# Patient Record
Sex: Male | Born: 1945 | Race: White | Hispanic: No | Marital: Married | Smoking: Former smoker
Health system: Southern US, Community
[De-identification: ages and names within clinical notes are randomized; demographics above are authoritative.]

## PROBLEM LIST (undated history)

## (undated) DIAGNOSIS — R519 Headache, unspecified: Secondary | ICD-10-CM

## (undated) DIAGNOSIS — R51 Headache: Secondary | ICD-10-CM

## (undated) DIAGNOSIS — I77810 Thoracic aortic ectasia: Secondary | ICD-10-CM

## (undated) DIAGNOSIS — F419 Anxiety disorder, unspecified: Secondary | ICD-10-CM

## (undated) DIAGNOSIS — J449 Chronic obstructive pulmonary disease, unspecified: Secondary | ICD-10-CM

## (undated) DIAGNOSIS — C44310 Basal cell carcinoma of skin of unspecified parts of face: Secondary | ICD-10-CM

## (undated) DIAGNOSIS — M5030 Other cervical disc degeneration, unspecified cervical region: Secondary | ICD-10-CM

## (undated) DIAGNOSIS — R600 Localized edema: Secondary | ICD-10-CM

## (undated) DIAGNOSIS — J189 Pneumonia, unspecified organism: Secondary | ICD-10-CM

## (undated) DIAGNOSIS — C61 Malignant neoplasm of prostate: Secondary | ICD-10-CM

## (undated) DIAGNOSIS — C4490 Unspecified malignant neoplasm of skin, unspecified: Secondary | ICD-10-CM

## (undated) DIAGNOSIS — I5032 Chronic diastolic (congestive) heart failure: Secondary | ICD-10-CM

## (undated) DIAGNOSIS — I272 Pulmonary hypertension, unspecified: Secondary | ICD-10-CM

## (undated) DIAGNOSIS — M1990 Unspecified osteoarthritis, unspecified site: Secondary | ICD-10-CM

## (undated) DIAGNOSIS — J42 Unspecified chronic bronchitis: Secondary | ICD-10-CM

## (undated) DIAGNOSIS — I4819 Other persistent atrial fibrillation: Secondary | ICD-10-CM

## (undated) DIAGNOSIS — K5900 Constipation, unspecified: Secondary | ICD-10-CM

## (undated) DIAGNOSIS — F329 Major depressive disorder, single episode, unspecified: Secondary | ICD-10-CM

## (undated) DIAGNOSIS — F32A Depression, unspecified: Secondary | ICD-10-CM

## (undated) DIAGNOSIS — Z872 Personal history of diseases of the skin and subcutaneous tissue: Secondary | ICD-10-CM

## (undated) HISTORY — DX: Chronic obstructive pulmonary disease, unspecified: J44.9

## (undated) HISTORY — DX: Chronic diastolic (congestive) heart failure: I50.32

## (undated) HISTORY — PX: COLONOSCOPY: SHX174

## (undated) HISTORY — PX: EXCISIONAL HEMORRHOIDECTOMY: SHX1541

## (undated) HISTORY — PX: PROSTATE BIOPSY: SHX241

## (undated) HISTORY — PX: TONSILLECTOMY: SUR1361

## (undated) HISTORY — DX: Thoracic aortic ectasia: I77.810

## (undated) HISTORY — DX: Other persistent atrial fibrillation: I48.19

## (undated) HISTORY — DX: Basal cell carcinoma of skin of unspecified parts of face: C44.310

## (undated) HISTORY — DX: Pulmonary hypertension, unspecified: I27.20

---

## 2000-05-13 HISTORY — PX: INGUINAL HERNIA REPAIR: SUR1180

## 2005-04-16 ENCOUNTER — Emergency Department (HOSPITAL_COMMUNITY): Admission: EM | Admit: 2005-04-16 | Discharge: 2005-04-16 | Payer: Self-pay | Admitting: Emergency Medicine

## 2009-07-01 ENCOUNTER — Encounter: Payer: Self-pay | Admitting: Internal Medicine

## 2009-08-02 ENCOUNTER — Ambulatory Visit: Payer: Self-pay | Admitting: Internal Medicine

## 2010-06-12 NOTE — Assessment & Plan Note (Signed)
Summary: Pulmonary/ new pt eval for hemoptysis   Visit Type:  Initial Consult Copy to:  Dr. Joseph Art Primary Provider/Referring Provider:  none  CC:  Hemoptysis.  History of Present Illness: 68 yowm active smoker no previous problems referred by Dr Clearence Cheek for hemoptyisis new onset  August 02, 2009 cc "colds all winter" dating back to October but gets better between 100% then abupt onset cough 3/20 x 2 tbsp after window cleaning with bleach productions with traces of blood initially then improved after abx assoc with bloody nasal drainage x 1 with abx started 3/21 Levaquin seems worse on prone position like something draining from nose.    Pt denies any significant sore throat, dysphagia, itching, sneezing,  nasal congestion or excess secretions,  fever, chills, sweats, unintended wt loss, pleuritic or exertional cp, change in activity tolerance  orthopnea pnd or leg swelling Pt also denies any obvious fluctuation in symptoms with weather or environmental change or other alleviating or aggravating factors.  .     Current Medications (verified): 1)  Multivitamins  Tabs (Multiple Vitamin) .[name].. 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  Smokes 1 ppd. Pianist  Review of Systems       The patient complains of coughing up blood.  The patient denies shortness of breath with activity, shortness of breath at rest, productive cough, non-productive cough, chest pain, irregular heartbeats, acid heartburn, indigestion, loss of appetite, weight change, abdominal pain, difficulty swallowing, sore throat, tooth/dental problems, headaches, nasal congestion/difficulty breathing through nose, sneezing, itching, ear ache, anxiety, depression, hand/feet  swelling, joint stiffness or pain, rash, change in color of mucus, and fever.    Vital Signs:  Patient profile:   65 year old male Height:      72 inches Weight:      188.13 pounds BMI:     25.61 O2 Sat:      96 % on Room air Temp:     97.5 degrees F oral Pulse rate:   100 / minute BP sitting:   122 / 78  (left arm)  Vitals Entered By: Tilden Dome (August 02, 2009 2:00 PM)  O2 Flow:  Room air  Physical Exam  Additional Exam:  wt 188 August 02, 2009 HEENT: nl dentition, turbinates, and orophanx. Nl external ear canals without cough reflex NECK :  without JVD/Nodes/TM/ nl carotid upstrokes bilaterally LUNGS: no acc muscle use, clear to A and P bilaterally without cough on insp or exp maneuvers CV:  RRR  no s3 or murmur or increase in P2, no edema  ABD:  soft and nontender with nl excursion in the supine position. 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  Pleural and parenchymal scarring at both apices.  No identifiably active process.  Impression & Recommendations:  Problem # 1:  HEMOPTYSIS (RFF-638.3) Likely from  bloody post nasal drainage post op suggested by worsening in supine position.  Agree with empriric levaquin and might consider fob if continues  but would prefer ENT take a look at their work first because this is the most likely source.  Problem # 2:  ABNORMAL LUNG XRAY (JJK-093.1)  Classic cxr for prior granomatous infection but no evidence of active dz or source for hemoptysis, no f/u needed unless symptoms recur.  Orders: New Patient Level V  (81829)  Medications Added to Medication List This Visit: 1)  Multivitamins Tabs (Multiple vitamin) .[name].. 1 once daily  Other Orders: T-2 View CXR ([id number])  Patient Instructions: 1)  No advil aleve or aspirin 2)  Finish Levaquin  3)  Return if you cough up blood without an obvious nasal source and have been cleared by your nose doctor at the [facility]

## 2011-04-29 ENCOUNTER — Ambulatory Visit (INDEPENDENT_AMBULATORY_CARE_PROVIDER_SITE_OTHER): Payer: Medicare Other

## 2011-04-29 DIAGNOSIS — R51 Headache: Secondary | ICD-10-CM

## 2011-04-29 DIAGNOSIS — H05239 Hemorrhage of unspecified orbit: Secondary | ICD-10-CM

## 2011-11-10 ENCOUNTER — Ambulatory Visit (INDEPENDENT_AMBULATORY_CARE_PROVIDER_SITE_OTHER): Payer: Medicare Other | Admitting: Emergency Medicine

## 2011-11-10 VITALS — BP 114/71 | HR 96 | Temp 98.2°F | Resp 18 | Ht 71.0 in | Wt 173.0 lb

## 2011-11-10 DIAGNOSIS — J019 Acute sinusitis, unspecified: Secondary | ICD-10-CM

## 2011-11-10 DIAGNOSIS — J309 Allergic rhinitis, unspecified: Secondary | ICD-10-CM

## 2011-11-10 MED ORDER — AMOXICILLIN 875 MG PO TABS: 875.0000 mg | ORAL_TABLET | Freq: Two times a day (BID) | ORAL | Status: AC

## 2011-11-10 MED ORDER — AZELASTINE HCL 0.1 % NA SOLN: 2.0000 | Freq: Two times a day (BID) | NASAL | Status: DC

## 2011-11-10 MED ORDER — FLUTICASONE PROPIONATE 50 MCG/ACT NA SUSP: 2.0000 | Freq: Every day | NASAL | Status: DC

## 2011-11-10 NOTE — Patient Instructions (Addendum)
Get plenty of rest and drink at least 64 ounces of water daily.

## 2011-11-10 NOTE — Progress Notes (Signed)
  Subjective:    Patient ID: [name], male    DOB: 11/29/1945, 66 y.o.   MRN: [id number]  HPI patient enters with a two-week history of headache congestion clear nasal drainage facial congestion and a postnasal drip with sore throat. He does have a cough which she seems is associated with a postnasal drip the    Review of Systems patient is in good health he sometimes takes some Claritin but overall does not take medication     Objective:   Physical Exam HEENT exam reveals the TMs to be clear to significantly congestion turbinates are red and edematous. Posterior pharynx is red. Neck is supple.       Assessment & Plan:  Patient with allergic rhinitis and secondary sinusitis. We'll treat with amoxicillin. Astelin. and Flonase.

## 2012-10-03 ENCOUNTER — Ambulatory Visit (INDEPENDENT_AMBULATORY_CARE_PROVIDER_SITE_OTHER): Payer: Medicare Other | Admitting: Family Medicine

## 2012-10-03 VITALS — BP 120/72 | HR 88 | Temp 98.9°F | Resp 18 | Ht 71.0 in | Wt 184.0 lb

## 2012-10-03 DIAGNOSIS — R059 Cough, unspecified: Secondary | ICD-10-CM

## 2012-10-03 DIAGNOSIS — R05 Cough: Secondary | ICD-10-CM

## 2012-10-03 DIAGNOSIS — J309 Allergic rhinitis, unspecified: Secondary | ICD-10-CM

## 2012-10-03 DIAGNOSIS — J209 Acute bronchitis, unspecified: Secondary | ICD-10-CM

## 2012-10-03 MED ORDER — FLUTICASONE PROPIONATE 50 MCG/ACT NA SUSP: 2.0000 | Freq: Every day | NASAL | Status: DC

## 2012-10-03 MED ORDER — AZELASTINE HCL 0.1 % NA SOLN: 2.0000 | Freq: Two times a day (BID) | NASAL | Status: DC

## 2012-10-03 MED ORDER — HYDROCODONE-HOMATROPINE 5-1.5 MG/5ML PO SYRP: 5.0000 mL | ORAL_SOLUTION | Freq: Every evening | ORAL | Status: DC | PRN

## 2012-10-03 MED ORDER — AMOXICILLIN-POT CLAVULANATE 875-125 MG PO TABS: 1.0000 | ORAL_TABLET | Freq: Two times a day (BID) | ORAL | Status: DC

## 2012-10-03 NOTE — Progress Notes (Signed)
 [facility]:  Office Visit  Chief Complaint:  Chief Complaint  Patient presents with  . chest congestion  . Allergies    HPI: [name] is a 67 y.o. male who complains of 4-5 days of  Chest congestion and lives in [place] area and thought it was allergies. However no improvement. He has chest congestion, runny nose, dripping, coughing up white sputum. He took tylenol. He had prostate bx last week so does not feel too good. He is still recovering from that. He did take some cough medicine. He smokes and has dx of COPD. Had this last year. Has been taking flonase. No SOB, CP, wheezing, facial pain or ear pain  Past Medical History  Diagnosis Date  . Cancer   . COPD (chronic obstructive pulmonary disease)   . Facial basal cell cancer    Past Surgical History  Procedure Laterality Date  . Hernia repair    . Prostate biopsy     History   Social History  . Marital Status: Single    Spouse Name: N/A    Number of Children: N/A  . Years of Education: N/A   Social History Main Topics  . Smoking status: Current Every Day Smoker  . Smokeless tobacco: None  . Alcohol Use: No  . Drug Use: No  . Sexually Active: None   Other Topics Concern  . None   Social History Narrative  . None   History reviewed. No pertinent family history. Allergies  Allergen Reactions  . Demerol (Meperidine) Other (See Comments)    Causes system to shutdown.    Prior to Admission medications   Medication Sig Start Date End Date Taking? Authorizing Provider  Ascorbic Acid (VITAMIN C) 1000 MG tablet Take 1,000 mg by mouth 2 (two) times daily.   Yes Historical Provider, MD  aspirin 81 MG tablet Take 81 mg by mouth daily.   Yes Historical Provider, MD  fluticasone (FLONASE) 50 MCG/ACT nasal spray Place 2 sprays into the nose daily. 11/10/11 11/09/12 Yes Darlyne Russian, MD  glucosamine-chondroitin 500-400 MG tablet Take 1 tablet by mouth 3 (three) times daily.   Yes Historical  Provider, MD  Iodine, Kelp, (KELP PO) Take by mouth.   Yes Historical Provider, MD  Probiotic Product (PROBIOTIC DAILY PO) Take by mouth.   Yes Historical Provider, MD  azelastine (ASTELIN) 137 MCG/SPRAY nasal spray Place 2 sprays into the nose 2 (two) times daily. Use in each nostril as directed 11/10/11 11/09/12  Darlyne Russian, MD     ROS: The patient denies fevers, chills, night sweats, unintentional weight loss, chest pain, palpitations, wheezing, dyspnea on exertion, nausea, vomiting, abdominal pain, dysuria, hematuria, melena, numbness, or tingling.   All other systems have been reviewed and were otherwise negative with the exception of those mentioned in the HPI and as above.    PHYSICAL EXAM: Filed Vitals:   10/03/12 1550  BP: 120/72  Pulse: 88  Temp: 98.9 F (37.2 C)  Resp: 18   Filed Vitals:   10/03/12 1550  Height: _0  (1.803 m)  Weight: 184 lb (83.462 kg)   Body mass index is 25.67 kg/(m^2).  General: Alert, no acute distress HEENT:  Normocephalic, atraumatic, oropharynx patent. + boggy nares, No exudates, TM nl. No sinus tendereness Cardiovascular:  Regular rate and rhythm, no rubs murmurs or gallops.  No Carotid bruits, radial pulse intact. No pedal edema.  Respiratory: Clear to auscultation bilaterally.  No wheezes, rales, or rhonchi.  No  cyanosis, no use of accessory musculature GI: No organomegaly, abdomen is soft and non-tender, positive bowel sounds.  No masses. Skin: No rashes. Neurologic: Facial musculature symmetric. Psychiatric: Patient is appropriate throughout our interaction. Lymphatic: No cervical lymphadenopathy Musculoskeletal: Gait intact.   LABS: No results found for this or any previous visit.   EKG/XRAY:   Primary read interpreted by Dr. Marin Comment at [facility].   ASSESSMENT/PLAN: Encounter Diagnoses  Name Primary?  . AR (allergic rhinitis) Yes  . Cough   . Acute bronchitis    Rx Augmentin Rx Hydromet Rx Flonase Take otc zyrtec and mucinex   F/u prn    , [place], DO 10/03/2012 5:00 PM

## 2012-11-17 ENCOUNTER — Encounter: Payer: Self-pay | Admitting: Family Medicine

## 2012-11-17 DIAGNOSIS — M6530 Trigger finger, unspecified finger: Secondary | ICD-10-CM

## 2012-12-04 ENCOUNTER — Ambulatory Visit (INDEPENDENT_AMBULATORY_CARE_PROVIDER_SITE_OTHER): Payer: Medicare Other | Admitting: Family Medicine

## 2012-12-04 VITALS — BP 108/68 | HR 79 | Temp 97.8°F | Resp 18 | Ht 71.0 in | Wt 184.0 lb

## 2012-12-04 DIAGNOSIS — R05 Cough: Secondary | ICD-10-CM

## 2012-12-04 DIAGNOSIS — J42 Unspecified chronic bronchitis: Secondary | ICD-10-CM

## 2012-12-04 DIAGNOSIS — K4020 Bilateral inguinal hernia, without obstruction or gangrene, not specified as recurrent: Secondary | ICD-10-CM

## 2012-12-04 DIAGNOSIS — K4090 Unilateral inguinal hernia, without obstruction or gangrene, not specified as recurrent: Secondary | ICD-10-CM

## 2012-12-04 DIAGNOSIS — J209 Acute bronchitis, unspecified: Secondary | ICD-10-CM

## 2012-12-04 MED ORDER — DOXYCYCLINE HYCLATE 100 MG PO TABS: 100.0000 mg | ORAL_TABLET | Freq: Two times a day (BID) | ORAL | Status: DC

## 2012-12-04 NOTE — Patient Instructions (Signed)
Start the antibiotic as discussed. Ok to take mucinex - unless this causes you to wheeze - then stop this medicine. Return to the clinic or go to the nearest emergency room if any of your symptoms worsen or new symptoms occur.  if you would like [place] to refer you to a surgeon for the hernia - let me know.   Cough, Adult  A cough is a reflex that helps clear your throat and airways. It can help heal the body or may be a reaction to an irritated airway. A cough may only last 2 or 3 weeks (acute) or may last more than 8 weeks (chronic).  CAUSES Acute cough:  Viral or bacterial infections. Chronic cough:  Infections.  Allergies.  Asthma.  Post-nasal drip.  Smoking.  Heartburn or acid reflux.  Some medicines.  Chronic lung problems (COPD).  Cancer. SYMPTOMS   Cough.  Fever.  Chest pain.  Increased breathing rate.  High-pitched whistling sound when breathing (wheezing).  Colored mucus that you cough up (sputum). TREATMENT   A bacterial cough may be treated with antibiotic medicine.  A viral cough must run its course and will not respond to antibiotics.  Your caregiver may recommend other treatments if you have a chronic cough. HOME CARE INSTRUCTIONS   Only take over-the-counter or prescription medicines for pain, discomfort, or fever as directed by your caregiver. Use cough suppressants only as directed by your caregiver.  Use a cold steam vaporizer or humidifier in your bedroom or home to help loosen secretions.  Sleep in a semi-upright position if your cough is worse at night.  Rest as needed.  Stop smoking if you smoke. SEEK IMMEDIATE MEDICAL CARE IF:   You have pus in your sputum.  Your cough starts to worsen.  You cannot control your cough with suppressants and are losing sleep.  You begin coughing up blood.  You have difficulty breathing.  You develop pain which is getting worse or is uncontrolled with medicine.  You have a fever. MAKE SURE YOU:    Understand these instructions.  Will watch your condition.  Will get help right away if you are not doing well or get worse. Document Released: 10/26/2010 Document Revised: 07/22/2011 Document Reviewed: 10/26/2010 [facility] Patient Information 2014 [place].  Hernia A hernia occurs when an internal organ pushes out through a weak spot in the abdominal wall. Hernias most commonly occur in the groin and around the navel. Hernias often can be pushed back into place (reduced). Most hernias tend to get worse over time. Some abdominal hernias can get stuck in the opening (irreducible or incarcerated hernia) and cannot be reduced. An irreducible abdominal hernia which is tightly squeezed into the opening is at risk for impaired blood supply (strangulated hernia). A strangulated hernia is a medical emergency. Because of the risk for an irreducible or strangulated hernia, surgery may be recommended to repair a hernia. CAUSES   Heavy lifting.  Prolonged coughing.  Straining to have a bowel movement.  A cut (incision) made during an abdominal surgery. HOME CARE INSTRUCTIONS   Bed rest is not required. You may continue your normal activities.  Avoid lifting more than 10 pounds (4.5 kg) or straining.  Cough gently. If you are a smoker it is best to stop. Even the best hernia repair can break down with the continual strain of coughing. Even if you do not have your hernia repaired, a cough will continue to aggravate the problem.  Do not wear anything tight over  your hernia. Do not try to keep it in with an outside bandage or truss. These can damage abdominal contents if they are trapped within the hernia sac.  Eat a normal diet.  Avoid constipation. Straining over long periods of time will increase hernia size and encourage breakdown of repairs. If you cannot do this with diet alone, stool softeners may be used. SEEK IMMEDIATE MEDICAL CARE IF:   You have a fever.  You develop  increasing abdominal pain.  You feel nauseous or vomit.  Your hernia is stuck outside the abdomen, looks discolored, feels hard, or is tender.  You have any changes in your bowel habits or in the hernia that are unusual for you.  You have increased pain or swelling around the hernia.  You cannot push the hernia back in place by applying gentle pressure while lying down. MAKE SURE YOU:   Understand these instructions.  Will watch your condition.  Will get help right away if you are not doing well or get worse. Document Released: 04/29/2005 Document Revised: 07/22/2011 Document Reviewed: 12/17/2007 [facility] Patient Information 2014 [place].
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
## 2012-12-04 NOTE — Progress Notes (Deleted)
  Subjective:    Patient ID: [name], male    DOB: [birth year], 67 y.o.   MRN: [id number]  HPI    Review of Systems     Objective:   Physical Exam        Assessment & Plan:

## 2013-02-09 ENCOUNTER — Ambulatory Visit (INDEPENDENT_AMBULATORY_CARE_PROVIDER_SITE_OTHER): Payer: Medicare Other | Admitting: Radiology

## 2013-02-09 DIAGNOSIS — Z23 Encounter for immunization: Secondary | ICD-10-CM

## 2013-12-27 ENCOUNTER — Encounter: Payer: Self-pay | Admitting: *Deleted

## 2013-12-28 ENCOUNTER — Telehealth: Payer: Self-pay | Admitting: *Deleted

## 2013-12-28 NOTE — Telephone Encounter (Signed)
As per previous documentation, attempted to phone patient this morning at previously listed home number, phoned a 2nd time & male stated she just received that number & did not know patient.  Attempted to contact patient at 2nd listed number with no answer & unable to leave voicemail.    Letter will be sent out requesting [facility]CP/[facility] information for updating his EMR.

## 2014-01-06 ENCOUNTER — Other Ambulatory Visit: Payer: Self-pay | Admitting: Family Medicine

## 2014-01-06 ENCOUNTER — Ambulatory Visit (INDEPENDENT_AMBULATORY_CARE_PROVIDER_SITE_OTHER): Payer: Medicare Other | Admitting: Family Medicine

## 2014-01-06 VITALS — BP 110/70 | HR 78 | Temp 98.1°F | Resp 16 | Ht 70.5 in | Wt 179.4 lb

## 2014-01-06 DIAGNOSIS — R51 Headache: Secondary | ICD-10-CM

## 2014-01-06 DIAGNOSIS — J309 Allergic rhinitis, unspecified: Secondary | ICD-10-CM

## 2014-01-06 DIAGNOSIS — R21 Rash and other nonspecific skin eruption: Secondary | ICD-10-CM

## 2014-01-06 DIAGNOSIS — L821 Other seborrheic keratosis: Secondary | ICD-10-CM

## 2014-01-06 MED ORDER — FLUOCINONIDE-E 0.05 % EX CREA: 1.0000 "application " | TOPICAL_CREAM | Freq: Two times a day (BID) | CUTANEOUS | Status: DC

## 2014-01-06 MED ORDER — FLUTICASONE PROPIONATE 50 MCG/ACT NA SUSP: 2.0000 | Freq: Every day | NASAL | Status: DC

## 2014-01-06 NOTE — Progress Notes (Signed)
This chart was scribed for Christopher Haber, MD by Ladene Artist, ED Scribe. The patient was seen in room 11. Patient's care was started at 8:40 AM.  Patient ID: [name] MRN: [id number], DOB: 1945-10-23, 68 y.o. Date of Encounter: 01/06/2014, 8:40 AM  Primary Physician: No PCP Per Patient  Chief Complaint  Patient presents with   Rash    L Lower leg x 7 dys   Headache    Only when lying down on/off x yrs   Skin Problem    lesion? R groin x 1 mth   HPI: 68 y.o. year old male with history below presents with rash to L lower leg onset 1 week ago. He suspects allergic reaction to band aid. Pt has tried OTC antibiotic cream and Gold Bond with no relief. Pt also reports a skin spot to his R groin first noted 1 month ago. He denies discomfort at this time. Pt reports h/o skin sensitivity over the past few years in his shoulders and chest.   Pt also presents with an intermittent L-sided HA over the past few years. Pt states that pain is exacerbated with lying down. He also states that HA seems to be seasonal. 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 He has tried different pillows with no relief. Pt has used a nasal spray in the past with relief but states that prescription was not refilled.   Pt works as a Radiographer, therapeutic. He has played for [employer] for the past 9 years. Pt was stationed n the [place] while in the [employer].   Past Medical History  Diagnosis Date   Cancer    COPD (chronic obstructive pulmonary disease)    Facial basal cell cancer      Home Meds: Prior to Admission medications   Medication Sig Start Date End Date Taking? Authorizing Provider  aspirin 81 MG tablet Take 81 mg by mouth daily.   Yes Historical Provider, MD  azelastine (ASTELIN) 137 MCG/SPRAY nasal spray Place 2 sprays into the nose 2 (two) times daily. Use in each nostril as directed 10/03/12 10/03/13  Thao P Le, DO  fluticasone (FLONASE) 50 MCG/ACT nasal spray Place 2 sprays into the nose daily. 10/03/12  10/03/13  Thao P Le, DO    Allergies:  Allergies  Allergen Reactions   Demerol [Meperidine] Other (See Comments)    Causes system to shutdown.     History   Social History   Marital Status: Single    Spouse Name: N/A    Number of Children: N/A   Years of Education: N/A   Occupational History   Not on file.   Social History Main Topics   Smoking status: Current Every Day Smoker   Smokeless tobacco: Not on file   Alcohol Use: No   Drug Use: No   Sexual Activity: Not on file   Other Topics Concern   Not on file   Social History Narrative   No narrative on file     Review of Systems: Constitutional: negative for chills, fever, night sweats, weight changes, or fatigue  HEENT: negative for vision changes, hearing loss, congestion, rhinorrhea, ST, epistaxis, or sinus pressure Cardiovascular: negative for chest pain or palpitations Respiratory: negative for hemoptysis, wheezing, shortness of breath, or cough Abdominal: negative for abdominal pain, nausea, vomiting, diarrhea, or constipation Dermatological: +rash Neurologic: negative for dizziness or syncope, +headache All other systems reviewed and are otherwise negative with the exception to those above and in the HPI.   Physical Exam: Blood pressure  110/70, pulse 78, temperature 98.1 F (36.7 C), temperature source Oral, resp. rate 16, height 5' 10.5" (1.791 m), weight 179 lb 6.4 oz (81.375 kg), SpO2 99.00%., Body mass index is 25.37 kg/(m^2). General: Well developed, well nourished, in no acute distress. Head: Normocephalic, atraumatic, eyes without discharge, sclera non-icteric, nares are without discharge. Bilateral auditory canals clear, TM's are without perforation, pearly grey and translucent with reflective cone of light bilaterally. Oral cavity moist, posterior pharynx without exudate, erythema, peritonsillar abscess, or post nasal drip.  Neck: Supple. No thyromegaly. Full ROM. No  lymphadenopathy. Lungs: Clear bilaterally to auscultation without wheezes, rales, or rhonchi. Breathing is unlabored. Heart: RRR with S1 S2. No murmurs, rubs, or gallops appreciated. Abdomen: Soft, non-tender, non-distended with normoactive bowel sounds. No hepatomegaly. No rebound/guarding. No obvious abdominal masses. GU:  Seborrheic keratoses 5 mm x 7 mm in right groin Msk:  Strength and tone normal for age. Extremities/Skin: Warm and dry. No clubbing or cyanosis. No edema. Patient has erythema in the distribution of the Band-Aids covering a recurring 5 mm ulceration of the left tibia Neuro: Alert and oriented X 3. Moves all extremities spontaneously. Gait is normal. CNII-XII grossly in tact. Psych:  Responds to questions appropriately with a normal affect.    ASSESSMENT AND PLAN:  68 y.o. year old male with  Rash and nonspecific skin eruption - Plan: fluocinonide-emollient (LIDEX-E) 0.05 % cream, Ambulatory referral to Dermatology  Seborrheic keratosis  Allergic rhinitis, unspecified allergic rhinitis type - Plan: fluticasone (FLONASE) 50 MCG/ACT nasal spray  Headache(784.0) - Plan: fluticasone (FLONASE) 50 MCG/ACT nasal spray   Signed, Christopher Haber, MD 01/06/2014 8:40 AM

## 2014-01-06 NOTE — Patient Instructions (Addendum)
You have seborrheic keratosis in the groin.  The left leg shows an allergic reaction to bandaid (possibly latex) and the sore itself is suggestive of a skin cancer  The left temporal headache may be from sinus polyp, since it occurs when you are prone. If the headache persists, please come back

## 2014-01-18 ENCOUNTER — Encounter: Payer: Self-pay | Admitting: *Deleted

## 2014-02-15 ENCOUNTER — Encounter: Payer: Self-pay | Admitting: *Deleted

## 2014-02-24 ENCOUNTER — Telehealth: Payer: Self-pay | Admitting: *Deleted

## 2014-02-24 NOTE — Telephone Encounter (Addendum)
Returned patient's call after he responded to my last letter...  He sees Dr. Beckey Downing (part of the "Red Team") at the [facility] and just saw him last week.  Updated PCP in system.  Phoned [facility] [phone]) & [facility] for office ([address]) with my name, title, location, patient name & dob and my contact info.  Celestia Khat, RN at [facility] returned my call & confirmed patient's PCP, but further stated he only went annually. Upon review of the patient's chart, he did not see anything regarding colorectal ca screening.  Patient also sees Dr. Lacinda Axon at [facility] in [place] secondary to prostate ca.    Fax # [phone] [facility] if further info needed.

## 2014-03-17 ENCOUNTER — Ambulatory Visit (INDEPENDENT_AMBULATORY_CARE_PROVIDER_SITE_OTHER): Payer: Medicare Other | Admitting: Family Medicine

## 2014-03-17 VITALS — BP 110/62 | HR 82 | Temp 97.8°F | Resp 16 | Ht 71.0 in | Wt 181.0 lb

## 2014-03-17 DIAGNOSIS — H00023 Hordeolum internum right eye, unspecified eyelid: Secondary | ICD-10-CM

## 2014-03-17 MED ORDER — POLYMYXIN B-TRIMETHOPRIM 10000-0.1 UNIT/ML-% OP SOLN: 2.0000 [drp] | Freq: Four times a day (QID) | OPHTHALMIC | Status: DC

## 2014-03-17 NOTE — Progress Notes (Signed)
Subjective:    Patient ID: [name], male    DOB: [birth year], 68 y.o.   MRN: [id number] This chart was scribed for Delman Cheadle, MD by Marti Sleigh, Medical Scribe. This patient was seen in Room 2 and the patient's care was started a 9:09 AM.  Chief Complaint  Patient presents with  . Facial Swelling    right x 2 weeks     HPI Past Medical History  Diagnosis Date  . Cancer   . COPD (chronic obstructive pulmonary disease)   . Facial basal cell cancer    Allergies  Allergen Reactions  . Demerol [Meperidine] Other (See Comments)    Causes system to shutdown.    Current Outpatient Prescriptions on File Prior to Visit  Medication Sig Dispense Refill  . aspirin 81 MG tablet Take 81 mg by mouth daily.    [name] azelastine (ASTELIN) 0.1 % nasal spray PLACE 2 SPRAYS IN EACH NOSTRIL TWICE DAILY AS DIRECTED 30 mL 11  . fluocinonide-emollient (LIDEX-E) 0.05 % cream Apply 1 application topically 2 (two) times daily. 30 g 0  . fluticasone (FLONASE) 50 MCG/ACT nasal spray Place 2 sprays into both nostrils daily. 16 g 2   No current facility-administered medications on file prior to visit.   HPI Comments: [name] is a 68 y.o. male with a past hx of hemoptysis, squamous cell carcinoma, and abnormal lung x-ray who presents to [facility] complaining of gradually improving swelling in his right eye with associated pain that started one week ago. Pt states that he thought it was going to develop into a stye, but it has not done that. Pt is concerned with blepharitis. He states his wife had blepharitis recently and they sleep in the same bed. Pt endorses vision disturbance, eye discharge, right upper lid is sensitivity, and grainy feeling in eyes bilaterally. Pt denies pain with eye movement or holes in his vision. Pt wears glasses. Pt states we visited his eye doctor last year and his doctor did not find glaucoma or cataracts. Pt is unaware of MOI. Pt has used allergy visine OTC.    Review of Systems   Constitutional: Negative for fever and chills.  HENT: Positive for facial swelling.        No nasal lacrimation.  Eyes: Positive for photophobia, pain, discharge and visual disturbance.       No tearing from eye.  Skin: Negative for rash.  Neurological: Positive for headaches.       Objective:  BP 110/62 mmHg  Pulse 82  Temp(Src) 97.8 F (36.6 C) (Oral)  Resp 16  Ht _0  (1.803 m)  Wt 181 lb (82.101 kg)  BMI 25.26 kg/m2  SpO2 99%  Physical Exam  Constitutional: He is oriented to person, place, and time. He appears well-developed and well-nourished.  HENT:  Head: Normocephalic and atraumatic.  Eyes: Pupils are equal, round, and reactive to light.  Lower lid normal bilaterally. Left lower lid normal. Right Upper lid internal injection over lateral aspect consistent with hordeum. Normal fundoscopic exam bilaterally.  Neck: No JVD present.  Cardiovascular: Normal rate and regular rhythm.   Pulmonary/Chest: Effort normal and breath sounds normal. No respiratory distress.  Neurological: He is alert and oriented to person, place, and time.  Skin: Skin is warm and dry.  Psychiatric: He has a normal mood and affect. His behavior is normal.  Nursing note and vitals reviewed.      Assessment & Plan:   Hordeolum internum, right  Meds ordered this  encounter  Medications  . trimethoprim-polymyxin b (POLYTRIM) ophthalmic solution    Sig: Place 2 drops into the right eye every 6 (six) hours.    Dispense:  10 mL    Refill:  0    I personally performed the services described in this documentation, which was scribed in my presence. The recorded information has been reviewed and considered, and addended by me as needed.  Delman Cheadle, MD MPH

## 2014-03-17 NOTE — Patient Instructions (Signed)
Sty A sty (hordeolum) is an infection of a gland in the eyelid located at the base of the eyelash. A sty may develop a white or yellow head of pus. It can be puffy (swollen). Usually, the sty will burst and pus will come out on its own. They do not leave lumps in the eyelid once they drain. A sty is often confused with another form of cyst of the eyelid called a chalazion. Chalazions occur within the eyelid and not on the edge where the bases of the eyelashes are. They often are red, sore and then form firm lumps in the eyelid. CAUSES   Germs (bacteria).  Lasting (chronic) eyelid inflammation. SYMPTOMS   Tenderness, redness and swelling along the edge of the eyelid at the base of the eyelashes.  Sometimes, there is a white or yellow head of pus. It may or may not drain. DIAGNOSIS  An ophthalmologist will be able to distinguish between a sty and a chalazion and treat the condition appropriately.  TREATMENT   Styes are typically treated with warm packs (compresses) until drainage occurs.  In rare cases, medicines that kill germs (antibiotics) may be prescribed. These antibiotics may be in the form of drops, cream or pills.  If a hard lump has formed, it is generally necessary to do a small incision and remove the hardened contents of the cyst in a minor surgical procedure done in the office.  In suspicious cases, your caregiver may send the contents of the cyst to the lab to be certain that it is not a rare, but dangerous form of cancer of the glands of the eyelid. HOME CARE INSTRUCTIONS   Wash your hands often and dry them with a clean towel. Avoid touching your eyelid. This may spread the infection to other parts of the eye.  Apply heat to your eyelid for 10 to 20 minutes, several times a day, to ease pain and help to heal it faster.  Do not squeeze the sty. Allow it to drain on its own. Wash your eyelid carefully 3 to 4 times per day to remove any pus. SEEK IMMEDIATE MEDICAL CARE IF:    Your eye becomes painful or puffy (swollen).  Your vision changes.  Your sty does not drain by itself within 3 days.  Your sty comes back within a short period of time, even with treatment.  You have redness (inflammation) around the eye.  You have a fever. Document Released: 02/06/2005 Document Revised: 07/22/2011 Document Reviewed: 08/13/2013 [facility] Patient Information 2015 [place], [place]. This information is not intended to replace advice given to you by your health care provider. Make sure you discuss any questions you have with your health care provider.

## 2014-06-05 ENCOUNTER — Ambulatory Visit (INDEPENDENT_AMBULATORY_CARE_PROVIDER_SITE_OTHER): Payer: Medicare Other | Admitting: Family Medicine

## 2014-06-05 ENCOUNTER — Ambulatory Visit (INDEPENDENT_AMBULATORY_CARE_PROVIDER_SITE_OTHER): Payer: Medicare Other

## 2014-06-05 VITALS — BP 110/65 | HR 84 | Temp 98.0°F | Resp 20 | Ht 70.25 in | Wt 182.1 lb

## 2014-06-05 DIAGNOSIS — R05 Cough: Secondary | ICD-10-CM | POA: Diagnosis not present

## 2014-06-05 DIAGNOSIS — J988 Other specified respiratory disorders: Secondary | ICD-10-CM | POA: Diagnosis not present

## 2014-06-05 DIAGNOSIS — J440 Chronic obstructive pulmonary disease with acute lower respiratory infection: Secondary | ICD-10-CM | POA: Diagnosis not present

## 2014-06-05 DIAGNOSIS — J22 Unspecified acute lower respiratory infection: Secondary | ICD-10-CM

## 2014-06-05 DIAGNOSIS — J189 Pneumonia, unspecified organism: Secondary | ICD-10-CM

## 2014-06-05 DIAGNOSIS — R059 Cough, unspecified: Secondary | ICD-10-CM

## 2014-06-05 MED ORDER — ALBUTEROL SULFATE HFA 108 (90 BASE) MCG/ACT IN AERS: 2.0000 | INHALATION_SPRAY | Freq: Four times a day (QID) | RESPIRATORY_TRACT | Status: DC | PRN

## 2014-06-05 MED ORDER — AZITHROMYCIN 250 MG PO TABS: ORAL_TABLET | ORAL | Status: DC

## 2014-06-05 MED ORDER — IPRATROPIUM BROMIDE 0.02 % IN SOLN
0.5000 mg | Freq: Once | RESPIRATORY_TRACT | Status: AC
  Administered 2014-06-05: 0.5 mg via RESPIRATORY_TRACT

## 2014-06-05 MED ORDER — ALBUTEROL SULFATE (2.5 MG/3ML) 0.083% IN NEBU
2.5000 mg | INHALATION_SOLUTION | Freq: Once | RESPIRATORY_TRACT | Status: AC
  Administered 2014-06-05: 2.5 mg via RESPIRATORY_TRACT

## 2014-06-05 NOTE — Progress Notes (Signed)
 Chief Complaint:  Chief Complaint  Patient presents with  . Cough    with green phelgm x 5 days    HPI: [name] is a 69 y.o. male who is here for cough x 5 days, GS is sick , 69 years old, productive greeen sputum.  He is 1 ppd smoker, has tried to quit but hard to do it He has had some rattling  But no SOB No fever , some chills No facial pain, no fevers or chill, diffuse chills a few day s ago Has tried water and nothing else.     Past Medical History  Diagnosis Date  . Cancer   . COPD (chronic obstructive pulmonary disease)   . Facial basal cell cancer    Past Surgical History  Procedure Laterality Date  . Hernia repair    . Prostate biopsy     History   Social History  . Marital Status: Single    Spouse Name: N/A    Number of Children: N/A  . Years of Education: N/A   Social History Main Topics  . Smoking status: Current Every Day Smoker  . Smokeless tobacco: Never Used  . Alcohol Use: No  . Drug Use: No  . Sexual Activity: None   Other Topics Concern  . None   Social History Narrative   History reviewed. No pertinent family history. Allergies  Allergen Reactions  . Demerol [Meperidine] Other (See Comments)    Causes system to shutdown.    Prior to Admission medications   Medication Sig Start Date End Date Taking? Authorizing Provider  aspirin 81 MG tablet Take 81 mg by mouth daily.   Yes Historical Provider, MD  fluticasone (FLONASE) 50 MCG/ACT nasal spray Place 2 sprays into both nostrils daily. 01/06/14 01/06/15 Yes [name], MD  azelastine (ASTELIN) 0.1 % nasal spray PLACE 2 SPRAYS IN EACH NOSTRIL TWICE DAILY AS DIRECTED Patient not taking: Reported on 06/05/2014 01/06/14   [name], MD     ROS: The patient denies night sweats, unintentional weight loss, chest pain, palpitations,dyspnea on exertion, nausea, vomiting, abdominal pain, dysuria, hematuria, melena, numbness, weakness, or tingling.  All other systems have  been reviewed and were otherwise negative with the exception of those mentioned in the HPI and as above.    PHYSICAL EXAM: Filed Vitals:   06/05/14 1214  BP: 96/60  Pulse: 84  Temp: 98 F (36.7 C)  Resp: 20  SPo2 95% Filed Vitals:   06/05/14 1214  Height: 5' 10.25" (1.784 m)  Weight: 182 lb 2 oz (82.611 kg)   Body mass index is 25.96 kg/(m^2).  General: Alert, no acute distress HEENT:  Normocephalic, atraumatic, oropharynx patent. EOMI, PERRLA Cardiovascular:  Regular rate and rhythm, no rubs murmurs or gallops.  No Carotid bruits, radial pulse intact. No pedal edema.  Respiratory: Clear to auscultation bilaterally.  No wheezes, rales, or rhonchi.  No cyanosis, no use of accessory musculature GI: No organomegaly, abdomen is soft and non-tender, positive bowel sounds.  No masses. Skin: No rashes. Neurologic: Facial musculature symmetric. Psychiatric: Patient is appropriate throughout our interaction. Lymphatic: No cervical lymphadenopathy Musculoskeletal: Gait intact.   SpO2 Readings from Last 3 Encounters:  06/05/14 95%  03/17/14 99%  01/06/14 99%     LABS: No results found for this or any previous visit.   EKG/XRAY:   Primary read interpreted by Dr. Marin Comment at [facility]. Left lobe PNA   ASSESSMENT/PLAN: Encounter Diagnoses  Name Primary?  . Cough   .  Lower respiratory infection (e.g., bronchitis, pneumonia, pneumonitis, pulmonitis) Yes  . COPD with acute lower respiratory infection    Repeat BP was 110/65, SPo2 96% after nebs Rx Azithormycin Rx albuterol inhaler prn , otc mucinex prn , tylenol/motrin prn  F/u in 3-4 weeks for PNA resolution    Gross sideeffects, risk and benefits, and alternatives of medications d/w patient. Patient is aware that all medications have potential sideeffects and we are unable to predict every sideeffect or drug-drug interaction that may occur.  , Call[place], DO 06/05/2014 2:10 PM

## 2014-06-05 NOTE — Patient Instructions (Signed)
Acute Bronchitis Bronchitis is inflammation of the airways that extend from the windpipe into the lungs (bronchi). The inflammation often causes mucus to develop. This leads to a cough, which is the most common symptom of bronchitis.  In acute bronchitis, the condition usually develops suddenly and goes away over time, usually in a couple weeks. Smoking, allergies, and asthma can make bronchitis worse. Repeated episodes of bronchitis may cause further lung problems.  CAUSES Acute bronchitis is most often caused by the same virus that causes a cold. The virus can spread from person to person (contagious) through coughing, sneezing, and touching contaminated objects. SIGNS AND SYMPTOMS   Cough.   Fever.   Coughing up mucus.   Body aches.   Chest congestion.   Chills.   Shortness of breath.   Sore throat.  DIAGNOSIS  Acute bronchitis is usually diagnosed through a physical exam. Your health care provider will also ask you questions about your medical history. Tests, such as chest X-rays, are sometimes done to rule out other conditions.  TREATMENT  Acute bronchitis usually goes away in a couple weeks. Oftentimes, no medical treatment is necessary. Medicines are sometimes given for relief of fever or cough. Antibiotic medicines are usually not needed but may be prescribed in certain situations. In some cases, an inhaler may be recommended to help reduce shortness of breath and control the cough. A cool mist vaporizer may also be used to help thin bronchial secretions and make it easier to clear the chest.  HOME CARE INSTRUCTIONS  Get plenty of rest.   Drink enough fluids to keep your urine clear or pale yellow (unless you have a medical condition that requires fluid restriction). Increasing fluids may help thin your respiratory secretions (sputum) and reduce chest congestion, and it will prevent dehydration.   Take medicines only as directed by your health care provider.  If  you were prescribed an antibiotic medicine, finish it all even if you start to feel better.  Avoid smoking and secondhand smoke. Exposure to cigarette smoke or irritating chemicals will make bronchitis worse. If you are a smoker, consider using nicotine gum or skin patches to help control withdrawal symptoms. Quitting smoking will help your lungs heal faster.   Reduce the chances of another bout of acute bronchitis by washing your hands frequently, avoiding people with cold symptoms, and trying not to touch your hands to your mouth, nose, or eyes.   Keep all follow-up visits as directed by your health care provider.  SEEK MEDICAL CARE IF: Your symptoms do not improve after 1 week of treatment.  SEEK IMMEDIATE MEDICAL CARE IF:  You develop an increased fever or chills.   You have chest pain.   You have severe shortness of breath.  You have bloody sputum.   You develop dehydration.  You faint or repeatedly feel like you are going to pass out.  You develop repeated vomiting.  You develop a severe headache. MAKE SURE YOU:   Understand these instructions.  Will watch your condition.  Will get help right away if you are not doing well or get worse. Document Released: 06/06/2004 Document Revised: 09/13/2013 Document Reviewed: 10/20/2012 [facility] Patient Information 2015 [place], [place]. This information is not intended to replace advice given to you by your health care provider. Make sure you discuss any questions you have with your health care provider. Pneumonia, Adult Pneumonia is an infection of the lungs. It may be caused by a germ (virus or bacteria). Some types of pneumonia can  spread easily from person to person. This can happen when you cough or sneeze. HOME CARE  Only take medicine as told by your doctor.  Take your medicine (antibiotics) as told. Finish it even if you start to feel better.  Do not smoke.  You may use a vaporizer or humidifier in your room. This  can help loosen thick spit (mucus).  Sleep so you are almost sitting up (semi-upright). This helps reduce coughing.  Rest. A shot (vaccine) can help prevent pneumonia. Shots are often advised for:  People over 70 years old.  Patients on chemotherapy.  People with long-term (chronic) lung problems.  People with immune system problems. GET HELP RIGHT AWAY IF:   You are getting worse.  You cannot control your cough, and you are losing sleep.  You cough up blood.  Your pain gets worse, even with medicine.  You have a fever.  Any of your problems are getting worse, not better.  You have shortness of breath or chest pain. MAKE SURE YOU:   Understand these instructions.  Will watch your condition.  Will get help right away if you are not doing well or get worse. Document Released: 10/16/2007 Document Revised: 07/22/2011 Document Reviewed: 07/20/2010 [facility] Patient Information 2015 [place], [place]. This information is not intended to replace advice given to you by your health care provider. Make sure you discuss any questions you have with your health care provider.

## 2014-06-06 ENCOUNTER — Telehealth: Payer: Self-pay | Admitting: Family Medicine

## 2014-06-06 ENCOUNTER — Encounter: Payer: Self-pay | Admitting: Family Medicine

## 2014-06-06 NOTE — Telephone Encounter (Signed)
Spoke with patient, feeling better already, advise to f.u prn if worsenign sxs, otherwise in 3 weeks after last dose of zpack for repeat chest xray.

## 2014-06-09 ENCOUNTER — Telehealth: Payer: Self-pay

## 2014-06-09 NOTE — Telephone Encounter (Signed)
Spoke with patient, he took last dose z pack today, denies fevers, CP , SOb, has some minimal ankle swelling bilaterally . He is feelignbetter but tired, he is no longer coughing up green sputum. Advise to monitor for worsening sxs and return to office in in next 24-48 hrs if worsenign sxs. Elevate feet, still try to walk but no exertion.

## 2014-06-09 NOTE — Telephone Encounter (Signed)
Patient is calling to speak with Dr. Marin Comment or anyone one from clinical. Patient states that he was diagnosed with pneumonia and he finished the medication that he was prescribed. Now patient has swelling in his ankles and would like to know if this is caused from the medication or if it's fluid retention from drinking lots of water. Please call patient [phone] Spoke to Dr. Marin Comment about patient. It was requested that this message be routed directly to her.

## 2014-06-12 ENCOUNTER — Ambulatory Visit (INDEPENDENT_AMBULATORY_CARE_PROVIDER_SITE_OTHER): Payer: Medicare Other | Admitting: Family Medicine

## 2014-06-12 ENCOUNTER — Ambulatory Visit (INDEPENDENT_AMBULATORY_CARE_PROVIDER_SITE_OTHER): Payer: Medicare Other

## 2014-06-12 VITALS — BP 110/68 | HR 80 | Temp 97.9°F | Resp 18 | Wt 191.0 lb

## 2014-06-12 DIAGNOSIS — I5030 Unspecified diastolic (congestive) heart failure: Secondary | ICD-10-CM

## 2014-06-12 DIAGNOSIS — J189 Pneumonia, unspecified organism: Secondary | ICD-10-CM

## 2014-06-12 DIAGNOSIS — R600 Localized edema: Secondary | ICD-10-CM | POA: Diagnosis not present

## 2014-06-12 LAB — POCT CBC
Granulocyte percent: 59.9 %G (ref 37–80)
HCT, POC: 38.3 % — AB (ref 43.5–53.7)
Hemoglobin: 12.2 g/dL — AB (ref 14.1–18.1)
Lymph, poc: 3.5 — AB (ref 0.6–3.4)
MCH, POC: 29.3 pg (ref 27–31.2)
MCHC: 31.8 g/dL (ref 31.8–35.4)
MCV: 92.2 fL (ref 80–97)
MID (cbc): 1.1 — AB (ref 0–0.9)
MPV: 6.8 fL (ref 0–99.8)
POC Granulocyte: 6.9 (ref 2–6.9)
POC LYMPH PERCENT: 30.7 %L (ref 10–50)
POC MID %: 9.4 %M (ref 0–12)
Platelet Count, POC: 509 10*3/uL — AB (ref 142–424)
RBC: 4.15 M/uL — AB (ref 4.69–6.13)
RDW, POC: 15.4 %
WBC: 11.5 10*3/uL — AB (ref 4.6–10.2)

## 2014-06-12 LAB — COMPREHENSIVE METABOLIC PANEL
ALT: 44 U/L (ref 0–53)
AST: 35 U/L (ref 0–37)
Albumin: 3.2 g/dL — ABNORMAL LOW (ref 3.5–5.2)
Alkaline Phosphatase: 125 U/L — ABNORMAL HIGH (ref 39–117)
BUN: 14 mg/dL (ref 6–23)
CO2: 29 mEq/L (ref 19–32)
Calcium: 9.2 mg/dL (ref 8.4–10.5)
Chloride: 104 mEq/L (ref 96–112)
Creat: 0.84 mg/dL (ref 0.50–1.35)
Glucose, Bld: 90 mg/dL (ref 70–99)
Potassium: 5.1 mEq/L (ref 3.5–5.3)
Sodium: 139 mEq/L (ref 135–145)
Total Bilirubin: 0.5 mg/dL (ref 0.2–1.2)
Total Protein: 6 g/dL (ref 6.0–8.3)

## 2014-06-12 MED ORDER — FUROSEMIDE 20 MG PO TABS: 20.0000 mg | ORAL_TABLET | Freq: Every day | ORAL | Status: DC

## 2014-06-12 MED ORDER — LEVOFLOXACIN 500 MG PO TABS
500.0000 mg | ORAL_TABLET | Freq: Every day | ORAL | Status: DC
Start: 2014-06-12 — End: 2014-08-06

## 2014-06-12 NOTE — Progress Notes (Signed)
° °  Subjective:    Patient ID: [name], male    DOB: Feb 04, 1946, 69 y.o.   MRN: [id number] This chart was scribed for [name], MD by Zola Button, Medical Scribe. This patient was seen in Room 10 and the patient's care was started at 9:05 AM.    HPI HPI Comments: [name] is a 69 y.o. male who presents to the [facility] for a follow-up for PNA in the left lung. He was recently seen by Dr. Marin Comment 1 week ago and was diagnosed with PNA then. Patient finished his course of Z-pack 2-3 days ago. Patient also reports having new leg swelling in both of his legs - he is unsure if it is a reaction to the medication. The coughing has improved (no longer productive), but the cough has not gone away. Patient denies fever. He still smokes, but states he has cut back since getting ill; smoking worsens his symptoms.  Note by Dr. Marin Comment on 06/05/14: [name] is a 69 y.o. male who is here for cough x 5 days, GS is sick , 69 years old, productive greeen sputum.  He is 1 ppd smoker, has tried to quit but hard to do it He has had some rattling But no SOB No fever , some chills No facial pain, no fevers or chill, diffuse chills a few day s ago Has tried water and nothing else.   Review of Systems  Constitutional: Negative for fever.  Respiratory: Positive for cough.   Cardiovascular: Positive for leg swelling.       Objective:   Physical Exam CONSTITUTIONAL: Well developed/well nourished HEAD: Normocephalic/atraumatic EYES: EOM/PERRL ENMT: Mucous membranes moist, oropharynx clear NECK: supple no meningeal signs. No JVD, no adenopathy SPINE: entire spine nontender CV: S1/S2 noted, no murmurs/rubs/gallops noted LUNGS: Lungs are bibasilar more so on the left with extruded wheezes bilaterally. ABDOMEN: soft, nontender, no rebound or guarding GU: no cva tenderness NEURO: Pt is awake/alert, moves all extremitiesx4 EXTREMITIES: 2+ edema in feet midway up to calves  bilaterally. SKIN: warm, color normal PSYCH: no abnormalities of mood noted  [facility] reading (PRIMARY) by  Dr. Joseph Art chest x-ray shows some resolution of the left lower lobe infiltrate but there is diffuse interstitial edema throughout both lung fields..        Assessment & Plan:   This chart was scribed in my presence and reviewed by me personally.    ICD-9-CM ICD-10-CM   1. CAP (community acquired pneumonia) 68 J18.9 DG Chest 2 View     DG Chest 2 View     Comprehensive metabolic panel     POCT CBC     levofloxacin (LEVAQUIN) 500 MG tablet  2. Pedal edema 782.3 R60.0 Comprehensive metabolic panel  3. Diastolic congestive heart failure, unspecified congestive heart failure chronicity 428.30 I50.30 furosemide (LASIX) 20 MG tablet   428.0       Signed, [name], MD

## 2014-06-12 NOTE — Patient Instructions (Signed)
Pneumonia Pneumonia is an infection of the lungs.  CAUSES Pneumonia may be caused by bacteria or a virus. Usually, these infections are caused by breathing infectious particles into the lungs (respiratory tract). SIGNS AND SYMPTOMS   Cough.  Fever.  Chest pain.  Increased rate of breathing.  Wheezing.  Mucus production. DIAGNOSIS  If you have the common symptoms of pneumonia, your health care provider will typically confirm the diagnosis with a chest X-ray. The X-ray will show an abnormality in the lung (pulmonary infiltrate) if you have pneumonia. Other tests of your blood, urine, or sputum may be done to find the specific cause of your pneumonia. Your health care provider may also do tests (blood gases or pulse oximetry) to see how well your lungs are working. TREATMENT  Some forms of pneumonia may be spread to other people when you cough or sneeze. You may be asked to wear a mask before and during your exam. Pneumonia that is caused by bacteria is treated with antibiotic medicine. Pneumonia that is caused by the influenza virus may be treated with an antiviral medicine. Most other viral infections must run their course. These infections will not respond to antibiotics.  HOME CARE INSTRUCTIONS   Cough suppressants may be used if you are losing too much rest. However, coughing protects you by clearing your lungs. You should avoid using cough suppressants if you can.  Your health care provider may have prescribed medicine if he or she thinks your pneumonia is caused by bacteria or influenza. Finish your medicine even if you start to feel better.  Your health care provider may also prescribe an expectorant. This loosens the mucus to be coughed up.  Take medicines only as directed by your health care provider.  Do not smoke. Smoking is a common cause of bronchitis and can contribute to pneumonia. If you are a smoker and continue to smoke, your cough may last several weeks after your  pneumonia has cleared.  A cold steam vaporizer or humidifier in your room or home may help loosen mucus.  Coughing is often worse at night. Sleeping in a semi-upright position in a recliner or using a couple pillows under your head will help with this.  Get rest as you feel it is needed. Your body will usually let you know when you need to rest. PREVENTION A pneumococcal shot (vaccine) is available to prevent a common bacterial cause of pneumonia. This is usually suggested for:  People over 65 years old.  Patients on chemotherapy.  People with chronic lung problems, such as bronchitis or emphysema.  People with immune system problems. If you are over 65 or have a high risk condition, you may receive the pneumococcal vaccine if you have not received it before. In some countries, a routine influenza vaccine is also recommended. This vaccine can help prevent some cases of pneumonia.You may be offered the influenza vaccine as part of your care. If you smoke, it is time to quit. You may receive instructions on how to stop smoking. Your health care provider can provide medicines and counseling to help you quit. SEEK MEDICAL CARE IF: You have a fever. SEEK IMMEDIATE MEDICAL CARE IF:   Your illness becomes worse. This is especially true if you are elderly or weakened from any other disease.  You cannot control your cough with suppressants and are losing sleep.  You begin coughing up blood.  You develop pain which is getting worse or is uncontrolled with medicines.  Any of the symptoms   which initially brought you in for treatment are getting worse rather than better.  You develop shortness of breath or chest pain. MAKE SURE YOU:   Understand these instructions.  Will watch your condition.  Will get help right away if you are not doing well or get worse. Document Released: 04/29/2005 Document Revised: 09/13/2013 Document Reviewed: 07/19/2010 [facility] Patient Information 2015  [place], [place]. This information is not intended to replace advice given to you by your health care provider. Make sure you discuss any questions you have with your health care provider. Heart Failure Heart failure is a condition in which the heart has trouble pumping blood. This means your heart does not pump blood efficiently for your body to work well. In some cases of heart failure, fluid may back up into your lungs or you may have swelling (edema) in your lower legs. Heart failure is usually a long-term (chronic) condition. It is important for you to take good care of yourself and follow your health care provider's treatment plan. CAUSES  Some health conditions can cause heart failure. Those health conditions include:  High blood pressure (hypertension). Hypertension causes the heart muscle to work harder than normal. When pressure in the blood vessels is high, the heart needs to pump (contract) with more force in order to circulate blood throughout the body. High blood pressure eventually causes the heart to become stiff and weak.  Coronary artery disease (CAD). CAD is the buildup of cholesterol and fat (plaque) in the arteries of the heart. The blockage in the arteries deprives the heart muscle of oxygen and blood. This can cause chest pain and may lead to a heart attack. High blood pressure can also contribute to CAD.  Heart attack (myocardial infarction). A heart attack occurs when one or more arteries in the heart become blocked. The loss of oxygen damages the muscle tissue of the heart. When this happens, part of the heart muscle dies. The injured tissue does not contract as well and weakens the heart's ability to pump blood.  Abnormal heart valves. When the heart valves do not open and close properly, it can cause heart failure. This makes the heart muscle pump harder to keep the blood flowing.  Heart muscle disease (cardiomyopathy or myocarditis). Heart muscle disease is damage to the heart  muscle from a variety of causes. These can include drug or alcohol abuse, infections, or unknown reasons. These can increase the risk of heart failure.  Lung disease. Lung disease makes the heart work harder because the lungs do not work properly. This can cause a strain on the heart, leading it to fail.  Diabetes. Diabetes increases the risk of heart failure. High blood sugar contributes to high fat (lipid) levels in the blood. Diabetes can also cause slow damage to tiny blood vessels that carry important nutrients to the heart muscle. When the heart does not get enough oxygen and food, it can cause the heart to become weak and stiff. This leads to a heart that does not contract efficiently.  Other conditions can contribute to heart failure. These include abnormal heart rhythms, thyroid problems, and low blood counts (anemia). Certain unhealthy behaviors can increase the risk of heart failure, including:  Being overweight.  Smoking or chewing tobacco.  Eating foods high in fat and cholesterol.  Abusing illicit drugs or alcohol.  Lacking physical activity. SYMPTOMS  Heart failure symptoms may vary and can be hard to detect. Symptoms may include:  Shortness of breath with activity, such as climbing  stairs.  Persistent cough.  Swelling of the feet, ankles, legs, or abdomen.  Unexplained weight gain.  Difficulty breathing when lying flat (orthopnea).  Waking from sleep because of the need to sit up and get more air.  Rapid heartbeat.  Fatigue and loss of energy.  Feeling light-headed, dizzy, or close to fainting.  Loss of appetite.  Nausea.  Increased urination during the night (nocturia). DIAGNOSIS  A diagnosis of heart failure is based on your history, symptoms, physical examination, and diagnostic tests. Diagnostic tests for heart failure may include:  Echocardiography.  Electrocardiography.  Chest X-ray.  Blood tests.  Exercise stress test.  Cardiac  angiography.  Radionuclide scans. TREATMENT  Treatment is aimed at managing the symptoms of heart failure. Medicines, behavioral changes, or surgical intervention may be necessary to treat heart failure.  Medicines to help treat heart failure may include:  Angiotensin-converting enzyme (ACE) inhibitors. This type of medicine blocks the effects of a blood protein called angiotensin-converting enzyme. ACE inhibitors relax (dilate) the blood vessels and help lower blood pressure.  Angiotensin receptor blockers (ARBs). This type of medicine blocks the actions of a blood protein called angiotensin. Angiotensin receptor blockers dilate the blood vessels and help lower blood pressure.  Water pills (diuretics). Diuretics cause the kidneys to remove salt and water from the blood. The extra fluid is removed through urination. This loss of extra fluid lowers the volume of blood the heart pumps.  Beta blockers. These prevent the heart from beating too fast and improve heart muscle strength.  Digitalis. This increases the force of the heartbeat.  Healthy behavior changes include:  Obtaining and maintaining a healthy weight.  Stopping smoking or chewing tobacco.  Eating heart-healthy foods.  Limiting or avoiding alcohol.  Stopping illicit drug use.  Physical activity as directed by your health care provider.  Surgical treatment for heart failure may include:  A procedure to open blocked arteries, repair damaged heart valves, or remove damaged heart muscle tissue.  A pacemaker to improve heart muscle function and control certain abnormal heart rhythms.  An internal cardioverter defibrillator to treat certain serious abnormal heart rhythms.  A left ventricular assist device (LVAD) to assist the pumping ability of the heart. HOME CARE INSTRUCTIONS   Take medicines only as directed by your health care provider. Medicines are important in reducing the workload of your heart, slowing the  progression of heart failure, and improving your symptoms.  Do not stop taking your medicine unless directed by your health care provider.  Do not skip any dose of medicine.  Refill your prescriptions before you run out of medicine. Your medicines are needed every day.  Engage in moderate physical activity if directed by your health care provider. Moderate physical activity can benefit some people. The elderly and people with severe heart failure should consult with a health care provider for physical activity recommendations.  Eat heart-healthy foods. Food choices should be free of trans fat and low in saturated fat, cholesterol, and salt (sodium). Healthy choices include fresh or frozen fruits and vegetables, fish, lean meats, legumes, fat-free or low-fat dairy products, and whole grain or high fiber foods. Talk to a dietitian to learn more about heart-healthy foods.  Limit sodium if directed by your health care provider. Sodium restriction may reduce symptoms of heart failure in some people. Talk to a dietitian to learn more about heart-healthy seasonings.  Use healthy cooking methods. Healthy cooking methods include roasting, grilling, broiling, baking, poaching, steaming, or stir-frying. Talk to a dietitian  to learn more about healthy cooking methods.  Limit fluids if directed by your health care provider. Fluid restriction may reduce symptoms of heart failure in some people.  Weigh yourself every day. Daily weights are important in the early recognition of excess fluid. You should weigh yourself every morning after you urinate and before you eat breakfast. Wear the same amount of clothing each time you weigh yourself. Record your daily weight. Provide your health care provider with your weight record.  Monitor and record your blood pressure if directed by your health care provider.  Check your pulse if directed by your health care provider.  Lose weight if directed by your health care  provider. Weight loss may reduce symptoms of heart failure in some people.  Stop smoking or chewing tobacco. Nicotine makes your heart work harder by causing your blood vessels to constrict. Do not use nicotine gum or patches before talking to your health care provider.  Keep all follow-up visits as directed by your health care provider. This is important.  Limit alcohol intake to no more than 1 drink per day for nonpregnant women and 2 drinks per day for men. One drink equals 12 ounces of beer, 5 ounces of wine, or 1 ounces of hard liquor. Drinking more than that is harmful to your heart. Tell your health care provider if you drink alcohol several times a week. Talk with your health care provider about whether alcohol is safe for you. If your heart has already been damaged by alcohol or you have severe heart failure, drinking alcohol should be stopped completely.  Stop illicit drug use.  Stay up-to-date with immunizations. It is especially important to prevent respiratory infections through current pneumococcal and influenza immunizations.  Manage other health conditions such as hypertension, diabetes, thyroid disease, or abnormal heart rhythms as directed by your health care provider.  Learn to manage stress.  Plan rest periods when fatigued.  Learn strategies to manage high temperatures. If the weather is extremely hot:  Avoid vigorous physical activity.  Use air conditioning or fans or seek a cooler location.  Avoid caffeine and alcohol.  Wear loose-fitting, lightweight, and light-colored clothing.  Learn strategies to manage cold temperatures. If the weather is extremely cold:  Avoid vigorous physical activity.  Layer clothes.  Wear mittens or gloves, a hat, and a scarf when going outside.  Avoid alcohol.  Obtain ongoing education and support as needed.  Participate in or seek rehabilitation as needed to maintain or improve independence and quality of life. SEEK MEDICAL  CARE IF:   Your weight increases by 03 lb/1.4 kg in 1 day or 05 lb/2.3 kg in a week.  You have increasing shortness of breath that is unusual for you.  You are unable to participate in your usual physical activities.  You tire easily.  You cough more than normal, especially with physical activity.  You have any or more swelling in areas such as your hands, feet, ankles, or abdomen.  You are unable to sleep because it is hard to breathe.  You feel like your heart is beating fast (palpitations).  You become dizzy or light-headed upon standing up. SEEK IMMEDIATE MEDICAL CARE IF:   You have difficulty breathing.  There is a change in mental status such as decreased alertness or difficulty with concentration.  You have a pain or discomfort in your chest.  You have an episode of fainting (syncope). MAKE SURE YOU:   Understand these instructions.  Will watch your condition.  Will get help right away if you are not doing well or get worse. Document Released: 04/29/2005 Document Revised: 09/13/2013 Document Reviewed: 05/29/2012 [facility] Patient Information 2015 [place], [place]. This information is not intended to replace advice given to you by your health care provider. Make sure you discuss any questions you have with your health care provider.

## 2014-06-17 ENCOUNTER — Ambulatory Visit (INDEPENDENT_AMBULATORY_CARE_PROVIDER_SITE_OTHER): Payer: Medicare Other | Admitting: Family Medicine

## 2014-06-17 VITALS — BP 110/60 | HR 82 | Temp 98.0°F | Resp 16 | Ht 71.0 in | Wt 183.0 lb

## 2014-06-17 DIAGNOSIS — R600 Localized edema: Secondary | ICD-10-CM | POA: Diagnosis not present

## 2014-06-17 DIAGNOSIS — J189 Pneumonia, unspecified organism: Secondary | ICD-10-CM

## 2014-06-17 NOTE — Progress Notes (Signed)
° °  Subjective:    Patient ID: Krist Rosenboom, male    DOB: 11/01/1945, 69 y.o.   MRN: [id number] This chart was scribed for [name], MD by Zola Button, Medical Scribe. This patient was seen in Room 5 and the patient's care was started at 9:09 AM.   HPI HPI Comments: Nelvin Tomb is a 69 y.o. male who presents to the [facility] for a follow-up. He has not been able to come here sooner because his wife recently got sick; he thinks he spread his illness to her. Patient still has leg swelling. The swelling is improved when he puts his shoes and socks on and walks around. He reports great improvement with symptoms related to his pneumonia. He had a pneumonia shot at the [place] 4 months ago. Patient denies calf soreness and urinary frequency from the medication.  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  Note from 06/12/14: Kailan Laws is a 69 y.o. male who presents to the [facility] for a follow-up for PNA in the left lung. He was recently seen by Dr. Marin Comment 1 week ago and was diagnosed with PNA then. Patient finished his course of Z-pack 2-3 days ago. Patient also reports having new leg swelling in both of his legs - he is unsure if it is a reaction to the medication. The coughing has improved (no longer productive), but the cough has not gone away. Patient denies fever. He still smokes, but states he has cut back since getting ill; smoking worsens his symptoms. Patient states he normally sleeps on his side.   Review of Systems  Cardiovascular: Positive for leg swelling.  Genitourinary: Negative for frequency.  Musculoskeletal: Negative for myalgias.       Objective:   Physical Exam CONSTITUTIONAL: Well developed/well nourished HEAD: Normocephalic/atraumatic EYES: EOM/PERRL ENMT: Mucous membranes moist NECK: supple no meningeal signs SPINE: entire spine nontender CV: S1/S2 noted, no murmurs/rubs/gallops noted LUNGS: Lungs  are clear to auscultation bilaterally, no apparent distress ABDOMEN: soft, nontender, no rebound or guarding GU: no cva tenderness NEURO: Pt is awake/alert, moves all extremitiesx4 EXTREMITIES: pulses normal, full ROM SKIN: 1.5 cm cystic mass anterior to the voice box. PSYCH: no abnormalities of mood noted      Assessment & Plan:   Patient to complete his antibiotic course and double up on the Lasix the next few days. We also talked about elevating the legs to help with the edema.  Recheck if edema does not go down in the next 2-3 days.   Signed, Carola Frost.D.

## 2014-08-06 ENCOUNTER — Ambulatory Visit (INDEPENDENT_AMBULATORY_CARE_PROVIDER_SITE_OTHER): Payer: Medicare Other | Admitting: Family Medicine

## 2014-08-06 ENCOUNTER — Ambulatory Visit (INDEPENDENT_AMBULATORY_CARE_PROVIDER_SITE_OTHER): Payer: Medicare Other

## 2014-08-06 VITALS — BP 108/70 | HR 83 | Temp 98.0°F | Resp 20 | Ht 70.0 in | Wt 181.1 lb

## 2014-08-06 DIAGNOSIS — K4021 Bilateral inguinal hernia, without obstruction or gangrene, recurrent: Secondary | ICD-10-CM

## 2014-08-06 DIAGNOSIS — R059 Cough, unspecified: Secondary | ICD-10-CM

## 2014-08-06 DIAGNOSIS — R05 Cough: Secondary | ICD-10-CM

## 2014-08-06 DIAGNOSIS — R9389 Abnormal findings on diagnostic imaging of other specified body structures: Secondary | ICD-10-CM

## 2014-08-06 DIAGNOSIS — R938 Abnormal findings on diagnostic imaging of other specified body structures: Secondary | ICD-10-CM

## 2014-08-06 DIAGNOSIS — R600 Localized edema: Secondary | ICD-10-CM | POA: Diagnosis not present

## 2014-08-06 MED ORDER — AMOXICILLIN 875 MG PO TABS: 875.0000 mg | ORAL_TABLET | Freq: Two times a day (BID) | ORAL | Status: DC

## 2014-08-06 NOTE — Progress Notes (Signed)
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  He's had a problem with a right inguinal hernia for a long time but has noticed increasing swelling and tenderness on the left side now.  Patient's had a stormy course of pneumonia two months ago and recently started coughing again. He's a smoker and has been exposed to RSV there is grandson.  Patient has also had problems in the past with swelling of his legs. This has been resolved with the use of support hose and although his feet are somewhat cool with times, these had no skin breaks or loss of sensation.  Objective: Patient's alert and in no acute distress HEENT: Poor dentition, otherwise negative Chest: Rales in the left base Heart: Regular, no murmur Abdomen: Patient has bilateral hernias that are reducible but extending from the internal inguinal ring all the way to the external inguinal canal. Extremities: Normal gait, good pedal pulses, distal toes are somewhat violaceous and cool but they do have good capillary refill   [facility] reading (PRIMARY) by  Dr. Joseph Art:  CXR.lingular infiltrate has resolved. There is a suggestion of an air bronchogram in the left chest.  Assessment: Bilateral in lower hernias it really do need repair and bronchitis.  This chart was scribed in my presence and reviewed by me personally.    ICD-9-CM ICD-10-CM   1. Cough 786.2 R05 DG Chest 2 View     DG Chest 2 View  2. Abnormal CXR 793.2 R93.8 DG Chest 2 View     DG Chest 2 View  3. Bilateral recurrent inguinal hernia without obstruction or gangrene 550.93 K40.21 Ambulatory referral to General Surgery  4. Pedal edema 782.3 R60.0      Signed, [name], MD

## 2014-08-14 ENCOUNTER — Telehealth: Payer: Self-pay

## 2014-08-14 NOTE — Telephone Encounter (Signed)
The patient called to check on referral for hernia surgery.  The patient states that he has not heard from our office in over a week.  Please call the patient at [phone] to discuss referral.

## 2014-08-16 ENCOUNTER — Encounter: Payer: Self-pay | Admitting: *Deleted

## 2014-08-27 ENCOUNTER — Telehealth: Payer: Self-pay

## 2014-08-27 NOTE — Telephone Encounter (Addendum)
ERROR

## 2014-10-03 ENCOUNTER — Ambulatory Visit: Payer: Self-pay | Admitting: Surgery

## 2014-10-03 DIAGNOSIS — K4091 Unilateral inguinal hernia, without obstruction or gangrene, recurrent: Secondary | ICD-10-CM | POA: Diagnosis not present

## 2014-10-03 DIAGNOSIS — K4090 Unilateral inguinal hernia, without obstruction or gangrene, not specified as recurrent: Secondary | ICD-10-CM | POA: Diagnosis not present
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
## 2014-10-19 DIAGNOSIS — K4020 Bilateral inguinal hernia, without obstruction or gangrene, not specified as recurrent: Secondary | ICD-10-CM | POA: Diagnosis not present

## 2014-10-19 DIAGNOSIS — C61 Malignant neoplasm of prostate: Secondary | ICD-10-CM | POA: Diagnosis not present

## 2014-10-19 DIAGNOSIS — R351 Nocturia: Secondary | ICD-10-CM | POA: Diagnosis not present

## 2014-10-27 ENCOUNTER — Encounter: Payer: Self-pay | Admitting: *Deleted

## 2015-05-08 ENCOUNTER — Ambulatory Visit (INDEPENDENT_AMBULATORY_CARE_PROVIDER_SITE_OTHER): Payer: Medicare Other | Admitting: Emergency Medicine

## 2015-05-08 VITALS — BP 124/72 | HR 94 | Temp 98.6°F | Resp 17 | Ht 71.0 in | Wt 184.0 lb

## 2015-05-08 DIAGNOSIS — J209 Acute bronchitis, unspecified: Secondary | ICD-10-CM

## 2015-05-08 DIAGNOSIS — R05 Cough: Secondary | ICD-10-CM

## 2015-05-08 DIAGNOSIS — R059 Cough, unspecified: Secondary | ICD-10-CM

## 2015-05-08 DIAGNOSIS — J302 Other seasonal allergic rhinitis: Secondary | ICD-10-CM | POA: Diagnosis not present

## 2015-05-08 MED ORDER — LEVOFLOXACIN 500 MG PO TABS: 500.0000 mg | ORAL_TABLET | Freq: Every day | ORAL | Status: AC

## 2015-05-08 MED ORDER — HYDROCOD POLST-CPM POLST ER 10-8 MG/5ML PO SUER: 5.0000 mL | Freq: Two times a day (BID) | ORAL | Status: DC

## 2015-05-08 MED ORDER — ALBUTEROL SULFATE HFA 108 (90 BASE) MCG/ACT IN AERS: 2.0000 | INHALATION_SPRAY | Freq: Four times a day (QID) | RESPIRATORY_TRACT | Status: DC | PRN

## 2015-05-08 NOTE — Progress Notes (Signed)
Subjective:  Patient ID: [name], male    DOB: 1945-06-24  Age: 69 y.o. MRN: [id number]  CC: URI and Cough   HPI [name] presents  with a cough. He has cough productive purulent sputum. He has no shortness of breath. But he is wheezing despite using his regular inhalers. He has no fever chills. No nausea or vomiting. He has no stool change. He has no nasal congestion or postnasal drainage. Is no sore throat or ear pressure. No improvement with over-the-counter medication  History Henrick has a past medical history of Cancer ([place]); COPD (chronic obstructive pulmonary disease) ([place]); and Facial basal cell cancer.   He has past surgical history that includes Hernia repair and Prostate biopsy.   His  family history is not on file.  He   reports that he has been smoking.  He has never used smokeless tobacco. He reports that he does not drink alcohol or use illicit drugs.  Outpatient Prescriptions Prior to Visit  Medication Sig Dispense Refill  . aspirin 81 MG tablet Take 81 mg by mouth daily. Reported on 05/08/2015    . albuterol (PROVENTIL HFA;VENTOLIN HFA) 108 (90 BASE) MCG/ACT inhaler Inhale 2 puffs into the lungs every 6 (six) hours as needed for wheezing or shortness of breath. (Patient not taking: Reported on 06/12/2014) 1 Inhaler 0  . amoxicillin (AMOXIL) 875 MG tablet Take 1 tablet (875 mg total) by mouth 2 (two) times daily. 20 tablet 0  . azelastine (ASTELIN) 0.1 % nasal spray PLACE 2 SPRAYS IN EACH NOSTRIL TWICE DAILY AS DIRECTED (Patient not taking: Reported on 06/12/2014) 30 mL 11  . fluticasone (FLONASE) 50 MCG/ACT nasal spray Place 2 sprays into both nostrils daily. (Patient not taking: Reported on 08/06/2014) 16 g 2  . furosemide (LASIX) 20 MG tablet Take 1 tablet (20 mg total) by mouth daily. (Patient not taking: Reported on 08/06/2014) 30 tablet 3   No facility-administered medications prior to visit.    Social History   Social History  . Marital  Status: Single    Spouse Name: N/A  . Number of Children: N/A  . Years of Education: N/A   Social History Main Topics  . Smoking status: Current Every Day Smoker  . Smokeless tobacco: Never Used  . Alcohol Use: No  . Drug Use: No  . Sexual Activity: Not Asked   Other Topics Concern  . None   Social History Narrative     Review of Systems  Constitutional: Negative for fever, chills and appetite change.  HENT: Negative for congestion, ear pain, postnasal drip, sinus pressure and sore throat.   Eyes: Negative for pain and redness.  Respiratory: Positive for cough and shortness of breath. Negative for wheezing.   Cardiovascular: Negative for leg swelling.  Gastrointestinal: Negative for nausea, vomiting, abdominal pain, diarrhea, constipation and blood in stool.  Endocrine: Negative for polyuria.  Genitourinary: Negative for dysuria, urgency, frequency and flank pain.  Musculoskeletal: Negative for gait problem.  Skin: Negative for rash.  Neurological: Negative for weakness and headaches.  Psychiatric/Behavioral: Negative for confusion and decreased concentration. The patient is not nervous/anxious.     Objective:  BP 124/72 mmHg  Pulse 94  Temp(Src) 98.6 F (37 C) (Oral)  Resp 17  Ht _0  (1.803 m)  Wt 184 lb (83.462 kg)  BMI 25.67 kg/m2  SpO2 95%  Physical Exam  Constitutional: He is oriented to person, place, and time. He appears well-developed and well-nourished. No distress.  HENT:  Head: Normocephalic and atraumatic.  Right Ear: External ear normal.  Left Ear: External ear normal.  Nose: Nose normal.  Eyes: Conjunctivae and EOM are normal. Pupils are equal, round, and reactive to light. No scleral icterus.  Neck: Normal range of motion. Neck supple. No tracheal deviation present.  Cardiovascular: Normal rate, regular rhythm and normal heart sounds.   Pulmonary/Chest: Effort normal. No respiratory distress. He has no wheezes. He has no rales.  Abdominal: He  exhibits no mass. There is no tenderness. There is no rebound and no guarding.  Musculoskeletal: He exhibits no edema.  Lymphadenopathy:    He has no cervical adenopathy.  Neurological: He is alert and oriented to person, place, and time. Coordination normal.  Skin: Skin is warm and dry. No rash noted.  Psychiatric: He has a normal mood and affect. His behavior is normal.       Assessment & Plan:   Jaquavian was seen today for uri and cough.  Diagnoses and all orders for this visit:  Acute bronchitis, unspecified organism  Seasonal allergic rhinitis  Cough -     albuterol (PROVENTIL HFA;VENTOLIN HFA) 108 (90 BASE) MCG/ACT inhaler; Inhale 2 puffs into the lungs every 6 (six) hours as needed for wheezing or shortness of breath.  Other orders -     levofloxacin (LEVAQUIN) 500 MG tablet; Take 1 tablet (500 mg total) by mouth daily. -     chlorpheniramine-HYDROcodone (TUSSIONEX PENNKINETIC ER) 10-8 MG/5ML SUER; Take 5 mLs by mouth 2 (two) times daily.  I have discontinued Mr. Monroy's fluticasone, azelastine, furosemide, and amoxicillin. I am also having him start on levofloxacin and chlorpheniramine-HYDROcodone. Additionally, I am having him maintain his aspirin and albuterol.  Meds ordered this encounter  Medications  . levofloxacin (LEVAQUIN) 500 MG tablet    Sig: Take 1 tablet (500 mg total) by mouth daily.    Dispense:  10 tablet    Refill:  0  . albuterol (PROVENTIL HFA;VENTOLIN HFA) 108 (90 BASE) MCG/ACT inhaler    Sig: Inhale 2 puffs into the lungs every 6 (six) hours as needed for wheezing or shortness of breath.    Dispense:  1 Inhaler    Refill:  0  . chlorpheniramine-HYDROcodone (TUSSIONEX PENNKINETIC ER) 10-8 MG/5ML SUER    Sig: Take 5 mLs by mouth 2 (two) times daily.    Dispense:  60 mL    Refill:  0    Appropriate red flag conditions were discussed with the patient as well as actions that should be taken.  Patient expressed his understanding.  Follow-up:  Return if symptoms worsen or fail to improve.  Roselee Culver, MD

## 2015-05-08 NOTE — Patient Instructions (Signed)

## 2015-07-04 ENCOUNTER — Other Ambulatory Visit: Payer: Self-pay | Admitting: General Surgery

## 2015-07-04 DIAGNOSIS — Z720 Tobacco use: Secondary | ICD-10-CM | POA: Diagnosis not present

## 2015-07-04 DIAGNOSIS — K4091 Unilateral inguinal hernia, without obstruction or gangrene, recurrent: Secondary | ICD-10-CM | POA: Diagnosis not present

## 2015-07-04 DIAGNOSIS — J449 Chronic obstructive pulmonary disease, unspecified: Secondary | ICD-10-CM | POA: Diagnosis not present

## 2015-07-04 DIAGNOSIS — K4090 Unilateral inguinal hernia, without obstruction or gangrene, not specified as recurrent: Secondary | ICD-10-CM | POA: Diagnosis not present

## 2015-07-14 DIAGNOSIS — K4020 Bilateral inguinal hernia, without obstruction or gangrene, not specified as recurrent: Secondary | ICD-10-CM | POA: Diagnosis not present

## 2015-07-14 DIAGNOSIS — C61 Malignant neoplasm of prostate: Secondary | ICD-10-CM | POA: Diagnosis not present

## 2015-08-08 ENCOUNTER — Ambulatory Visit (HOSPITAL_COMMUNITY)
Admission: RE | Admit: 2015-08-08 | Discharge: 2015-08-08 | Disposition: A | Discharge status: Home / Self Care | Payer: Medicare Other | Source: Ambulatory Visit | Attending: General Surgery | Admitting: General Surgery

## 2015-08-08 ENCOUNTER — Encounter (HOSPITAL_COMMUNITY)
Admission: RE | Admit: 2015-08-08 | Discharge: 2015-08-08 | Disposition: A | Discharge status: Home / Self Care | Payer: Medicare Other | Source: Ambulatory Visit | Attending: General Surgery | Admitting: General Surgery

## 2015-08-08 ENCOUNTER — Encounter (HOSPITAL_COMMUNITY): Payer: Self-pay

## 2015-08-08 DIAGNOSIS — M4184 Other forms of scoliosis, thoracic region: Secondary | ICD-10-CM | POA: Insufficient documentation

## 2015-08-08 DIAGNOSIS — Z01818 Encounter for other preprocedural examination: Secondary | ICD-10-CM | POA: Diagnosis not present

## 2015-08-08 DIAGNOSIS — Z01812 Encounter for preprocedural laboratory examination: Secondary | ICD-10-CM | POA: Insufficient documentation

## 2015-08-08 DIAGNOSIS — R918 Other nonspecific abnormal finding of lung field: Secondary | ICD-10-CM | POA: Insufficient documentation

## 2015-08-08 DIAGNOSIS — M47894 Other spondylosis, thoracic region: Secondary | ICD-10-CM | POA: Diagnosis not present

## 2015-08-08 DIAGNOSIS — I517 Cardiomegaly: Secondary | ICD-10-CM | POA: Diagnosis not present

## 2015-08-08 HISTORY — DX: Headache: R51

## 2015-08-08 HISTORY — DX: Constipation, unspecified: K59.00

## 2015-08-08 HISTORY — DX: Unspecified osteoarthritis, unspecified site: M19.90

## 2015-08-08 HISTORY — DX: Pneumonia, unspecified organism: J18.9

## 2015-08-08 HISTORY — DX: Localized edema: R60.0

## 2015-08-08 HISTORY — DX: Headache, unspecified: R51.9

## 2015-08-08 LAB — CBC WITH DIFFERENTIAL/PLATELET
BASOS PCT: 1 %
Basophils Absolute: 0.1 10*3/uL (ref 0.0–0.1)
EOS ABS: 0.2 10*3/uL (ref 0.0–0.7)
EOS PCT: 2 %
HCT: 44.8 % (ref 39.0–52.0)
Hemoglobin: 15.1 g/dL (ref 13.0–17.0)
LYMPHS ABS: 3.5 10*3/uL (ref 0.7–4.0)
Lymphocytes Relative: 36 %
MCH: 30.7 pg (ref 26.0–34.0)
MCHC: 33.7 g/dL (ref 30.0–36.0)
MCV: 91.1 fL (ref 78.0–100.0)
MONOS PCT: 12 %
Monocytes Absolute: 1.1 10*3/uL — ABNORMAL HIGH (ref 0.1–1.0)
NEUTROS PCT: 49 %
Neutro Abs: 4.9 10*3/uL (ref 1.7–7.7)
PLATELETS: 357 10*3/uL (ref 150–400)
RBC: 4.92 MIL/uL (ref 4.22–5.81)
RDW: 15.8 % — AB (ref 11.5–15.5)
WBC: 9.7 10*3/uL (ref 4.0–10.5)

## 2015-08-08 LAB — COMPREHENSIVE METABOLIC PANEL
ALBUMIN: 2.8 g/dL — AB (ref 3.5–5.0)
ALT: 23 U/L (ref 17–63)
AST: 26 U/L (ref 15–41)
Alkaline Phosphatase: 156 U/L — ABNORMAL HIGH (ref 38–126)
Anion gap: 7 (ref 5–15)
BUN: 15 mg/dL (ref 6–20)
CALCIUM: 9.3 mg/dL (ref 8.9–10.3)
CO2: 30 mmol/L (ref 22–32)
CREATININE: 0.83 mg/dL (ref 0.61–1.24)
Chloride: 101 mmol/L (ref 101–111)
Glucose, Bld: 95 mg/dL (ref 65–99)
Potassium: 5.4 mmol/L — ABNORMAL HIGH (ref 3.5–5.1)
SODIUM: 138 mmol/L (ref 135–145)
Total Bilirubin: 0.8 mg/dL (ref 0.3–1.2)
Total Protein: 6.5 g/dL (ref 6.5–8.1)

## 2015-08-08 NOTE — Progress Notes (Signed)
Mr [name] reports that  He does not have any heart disease or see a cardiologist. Patient does have edema of lower extremities and wears compression stockings.  Patient's PCP is at [facility], we have requested reports.

## 2015-08-08 NOTE — Pre-Procedure Instructions (Signed)
[name]   08/08/2015    Your procedure is scheduled on Tuesday, April 4.  Report to [facility] Admitting at 5:30 A.M.               Your surgery or procedure is scheduled for 7:30 AM   Call this number if you have problems the morning of [phone]                 For any other questions, please call [phone], Monday - Friday 8 AM - 4 PM.   Remember:  Do not eat food or drink liquids after midnight Monday, April 3.  Take these medicines the morning of surgery with A SIP OF WATER: None                    Stop taking Aspirin, Coumadin, Plavix, Effient and Herbal medications.  Do not take any NSAIDs ie: Ibuprofen,  Advil,Naproxen or any medication containing Aspirin.   Do not wear jewelry, make-up or nail polish.             Do not wear lotions, powders, or perfumes.              Men may shave face and neck.  Do not bring valuables to the hospital.  [facility] is not responsible for any belongings or valuables.  Contacts, dentures or bridgework may not be worn into surgery.  Leave your suitcase in the car.  After surgery it may be brought to your room.  For patients admitted to the hospital, discharge time will be determined by your treatment team.  Patients discharged the day of surgery will not be allowed to drive home.   Name and phone number of your driver: -  Special instructions: Review  [name] - Preparing For Surgery.  Please read over the following fact sheets that you were given. Pain Booklet, Coughing and Deep Breathing and Surgical Site Infection Prevention

## 2015-08-13 NOTE — H&P (Signed)
[name]. Dalbert Batman  Location: [facility] Patient #: [id number] DOB: 04-15-1946 Married / Language: English / Race: White Male        History of Present Illness  The patient is a 70 year old male who presents with a complaint of recurrent right inguinal hernia, reducible left inguinal hernia. This is a 70 year old male who presents for evaluation of inguinal hernias.  He is a smoker. Has COPD. Has history of prostate cancer followed in [place]. In approximately 2004 at the [place] in [place] he underwent open repair of right inguinal hernia. I do not have the details. He is thinking that he did not have mesh. He states he had severe pain postop period had to be readmitted. He ultimately healed. He now states that he has had a bulge in his right groin for over 5 years. He's had a bulge in his left groin for one year. The left side is much larger than the right. He saw Dr. [name] in May 2016 and declined surgery. He comes back today stating that he wanted to see an older Psychologist, sport and exercise. No history of incarceration.  Comorbidities include active tobacco use. COPD with inhalers. History of prostate cancer treated with some type of transurethral procedure followed by Dr. Vale Haven, urology, [facility]. History of arthritis. History of pneumonia.  Family history reveals mother deceased in her 67s. Had colon cancer and a colostomy. Father deceased of congestive heart failure. Was an alcoholic  He is married. 2 stepchildren. Continues to smoke. Denies alcohol. 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  We are sending for the [place] records of his surgery and rehospitalization readmission in 2004. I want details of the surgery and found that what happened there. He wants to go ahead with repair of these hernias. He knows that smoking increases his risk for recurrence. I'm going to approach these from an anterior open approach  because the left side is so large. I do not think he has femoral hernias.  I discussed the indications, details, techniques, and numerous risk of open bilateral inguinal hernia repair with mesh. He is aware of the risk of bleeding, infection, recurrence especially on the right, nerve damage with chronic pain, injury to the adjacent organs such as the testicle bladder or intestine with major reconstructive surgery. I reviewed patient information booklets with him, drew pictures for hi. he wants to go ahead with the surgery in the near future. I'll plan to keep him in the hospital overnight for observation for pain control ,bleeding , and urinary retention. He likes that idea.   Addendum Note Records from the [place] revealed that on September 01, 2002 he had what sounds like a standard right Programmer, applications. No mesh was mentioned. We have the operative notes. We did not get any records about his readmission for postoperative pain control.   Allergies Demerol *ANALGESICS - OPIOID*  Medication History  Furosemide (20MG Tablet, Oral) Active. Albuterol Sulfate HFA (108 (90 Base)MCG/ACT Aerosol Soln, Inhalation) Active. Azelastine HCl (0.1% Solution, Nasal) Active. Flonase (50MCG/ACT Suspension, Nasal as needed) Active. Medications Reconciled  Vitals  Weight: 175 lb Height: 71in Body Surface Area: 1.99 m Body Mass Index: 24.41 kg/m  Temp.: 61F(Temporal)  Pulse: 79 (Regular)  BP: 126/76 (Sitting, Left Arm, Standard)   Physical Exam General Mental Status-Alert. General Appearance-Consistent with stated age. Hydration-Well hydrated. Voice-Normal.  Head and Neck Head-normocephalic, atraumatic with no lesions or palpable masses. Trachea-midline. Thyroid Gland Characteristics -  normal size and consistency.  Eye Eyeball - Bilateral-Extraocular movements intact. Sclera/Conjunctiva - Bilateral-No scleral icterus.  Chest and Lung Exam Chest and  lung exam reveals -quiet, even and easy respiratory effort with no use of accessory muscles and on auscultation, normal breath sounds, no adventitious sounds and normal vocal resonance. Inspection Chest Wall - Normal. Back - normal.  Cardiovascular Cardiovascular examination reveals -normal heart sounds, regular rate and rhythm with no murmurs and normal pedal pulses bilaterally.  Abdomen Inspection Inspection of the abdomen reveals - No Hernias. Skin - Scar - no surgical scars. Palpation/Percussion Palpation and Percussion of the abdomen reveal - Soft, Non Tender, No Rebound tenderness, No Rigidity (guarding) and No hepatosplenomegaly. Auscultation Auscultation of the abdomen reveals - Bowel sounds normal.  Male Genitourinary Note: Well-healed scar right groin. Small to medium size reducible hernia right groin. Very large hernia left groin extending down into the scrotum. Reducible when supine. When supine there is no scrotal mass.   Neurologic Neurologic evaluation reveals -alert and oriented x 3 with no impairment of recent or remote memory. Mental Status-Normal.  Musculoskeletal Normal Exam - Left-Upper Extremity Strength Normal and Lower Extremity Strength Normal. Normal Exam - Right-Upper Extremity Strength Normal and Lower Extremity Strength Normal.  Lymphatic Head & Neck  General Head & Neck Lymphatics: Bilateral - Description - Normal. Axillary  General Axillary Region: Bilateral - Description - Normal. Tenderness - Non Tender. Femoral & Inguinal  Generalized Femoral & Inguinal Lymphatics: Bilateral - Description - Normal. Tenderness - Non Tender.    Assessment & Plan   RECURRENT RIGHT INGUINAL HERNIA (K40.91)  You have a medium sized recurrent right inguinal hernia. You have a very large left inguinal hernia. Fortunately this is reducible You smoke cigarettes, and this increases your risk for wound complications and recurrence I recommend open  repair of your recurrent right inguinal hernia with mesh, and open repair of a left inguinal hernia with mesh. I do not advise laparoscopic repair because of the large size of the left inguinal hernia and the slight increased risk of recurrence with laparoscopic approach. We will ask the anesthesiologist to do nerve blocks on both sides to help with your postop pain We will admit to the hospital overnight to monitor for bleeding, pain control, and urinary retention We have discussed the indications, techniques, and numerous risk of this surgery with you  TOBACCO ABUSE (Z72.0) HISTORY OF PROSTATE CANCER (Z85.46) Impression: Followed by Marissa Nestle, urologist, [facility] in [place]. Has had some type of transurethral procedure. COPD, MILD (J44.9)]  REDUCIBLE LEFT INGUINAL HERNIA (K40.90)    Edsel Petrin. Dalbert Batman, M.D., [facility] General and Minimally invasive Surgery Breast and Colorectal Surgery Office:   [phone] Pager:   [phone]

## 2015-08-14 MED ORDER — CEFAZOLIN SODIUM-DEXTROSE 2-4 GM/100ML-% IV SOLN
2.0000 g | INTRAVENOUS | Status: AC
  Administered 2015-08-15: 2 g via INTRAVENOUS
  Filled 2015-08-14: qty 100

## 2015-08-15 ENCOUNTER — Encounter (HOSPITAL_COMMUNITY)
Admission: RE | Disposition: A | Discharge status: Home / Self Care | Payer: Self-pay | Source: Ambulatory Visit | Attending: General Surgery

## 2015-08-15 ENCOUNTER — Encounter (HOSPITAL_COMMUNITY): Payer: Self-pay | Admitting: General Practice

## 2015-08-15 ENCOUNTER — Ambulatory Visit (HOSPITAL_COMMUNITY): Payer: Medicare Other | Admitting: Certified Registered"

## 2015-08-15 ENCOUNTER — Ambulatory Visit (HOSPITAL_COMMUNITY)
Admission: RE | Admit: 2015-08-15 | Discharge: 2015-08-16 | Disposition: A | Discharge status: Home / Self Care | Payer: Medicare Other | Source: Ambulatory Visit | Attending: General Surgery | Admitting: General Surgery

## 2015-08-15 DIAGNOSIS — R339 Retention of urine, unspecified: Secondary | ICD-10-CM | POA: Diagnosis not present

## 2015-08-15 DIAGNOSIS — M1990 Unspecified osteoarthritis, unspecified site: Secondary | ICD-10-CM | POA: Insufficient documentation

## 2015-08-15 DIAGNOSIS — J449 Chronic obstructive pulmonary disease, unspecified: Secondary | ICD-10-CM | POA: Insufficient documentation

## 2015-08-15 DIAGNOSIS — K4090 Unilateral inguinal hernia, without obstruction or gangrene, not specified as recurrent: Secondary | ICD-10-CM | POA: Insufficient documentation

## 2015-08-15 DIAGNOSIS — Z8546 Personal history of malignant neoplasm of prostate: Secondary | ICD-10-CM | POA: Insufficient documentation

## 2015-08-15 DIAGNOSIS — Z800 Family history of malignant neoplasm of digestive organs: Secondary | ICD-10-CM | POA: Insufficient documentation

## 2015-08-15 DIAGNOSIS — Z7951 Long term (current) use of inhaled steroids: Secondary | ICD-10-CM | POA: Insufficient documentation

## 2015-08-15 DIAGNOSIS — Z8249 Family history of ischemic heart disease and other diseases of the circulatory system: Secondary | ICD-10-CM | POA: Insufficient documentation

## 2015-08-15 DIAGNOSIS — G8918 Other acute postprocedural pain: Secondary | ICD-10-CM | POA: Diagnosis not present

## 2015-08-15 DIAGNOSIS — Z7982 Long term (current) use of aspirin: Secondary | ICD-10-CM | POA: Diagnosis not present

## 2015-08-15 DIAGNOSIS — Z885 Allergy status to narcotic agent status: Secondary | ICD-10-CM | POA: Diagnosis not present

## 2015-08-15 DIAGNOSIS — F17210 Nicotine dependence, cigarettes, uncomplicated: Secondary | ICD-10-CM | POA: Insufficient documentation

## 2015-08-15 DIAGNOSIS — Z79899 Other long term (current) drug therapy: Secondary | ICD-10-CM | POA: Diagnosis not present

## 2015-08-15 DIAGNOSIS — K4091 Unilateral inguinal hernia, without obstruction or gangrene, recurrent: Secondary | ICD-10-CM | POA: Insufficient documentation

## 2015-08-15 DIAGNOSIS — Z811 Family history of alcohol abuse and dependence: Secondary | ICD-10-CM | POA: Diagnosis not present

## 2015-08-15 HISTORY — PX: INSERTION OF MESH: SHX5868

## 2015-08-15 HISTORY — DX: Unspecified malignant neoplasm of skin, unspecified: C44.90

## 2015-08-15 HISTORY — DX: Unspecified chronic bronchitis: J42

## 2015-08-15 HISTORY — DX: Other cervical disc degeneration, unspecified cervical region: M50.30

## 2015-08-15 HISTORY — DX: Personal history of diseases of the skin and subcutaneous tissue: Z87.2

## 2015-08-15 HISTORY — PX: INGUINAL HERNIA REPAIR: SUR1180

## 2015-08-15 HISTORY — PX: INGUINAL HERNIA REPAIR: SHX194

## 2015-08-15 HISTORY — DX: Malignant neoplasm of prostate: C61

## 2015-08-15 LAB — CBC
HEMATOCRIT: 40.1 % (ref 39.0–52.0)
HEMOGLOBIN: 13.1 g/dL (ref 13.0–17.0)
MCH: 29.8 pg (ref 26.0–34.0)
MCHC: 32.7 g/dL (ref 30.0–36.0)
MCV: 91.3 fL (ref 78.0–100.0)
Platelets: 318 10*3/uL (ref 150–400)
RBC: 4.39 MIL/uL (ref 4.22–5.81)
RDW: 15.6 % — ABNORMAL HIGH (ref 11.5–15.5)
WBC: 10.9 10*3/uL — AB (ref 4.0–10.5)

## 2015-08-15 LAB — CREATININE, SERUM: Creatinine, Ser: 0.73 mg/dL (ref 0.61–1.24)

## 2015-08-15 SURGERY — REPAIR, HERNIA, INGUINAL, BILATERAL, ADULT
Anesthesia: Regional | Site: Groin | Laterality: Bilateral

## 2015-08-15 MED ORDER — ONDANSETRON HCL 4 MG/2ML IJ SOLN
INTRAMUSCULAR | Status: DC | PRN
  Administered 2015-08-15: 4 mg via INTRAVENOUS

## 2015-08-15 MED ORDER — LACTATED RINGERS IV SOLN
INTRAVENOUS | Status: DC | PRN
  Administered 2015-08-15 (×2): via INTRAVENOUS

## 2015-08-15 MED ORDER — ROCURONIUM BROMIDE 100 MG/10ML IV SOLN
INTRAVENOUS | Status: DC | PRN
  Administered 2015-08-15: 10 mg via INTRAVENOUS
  Administered 2015-08-15: 20 mg via INTRAVENOUS
  Administered 2015-08-15: 30 mg via INTRAVENOUS

## 2015-08-15 MED ORDER — LIDOCAINE HCL (CARDIAC) 20 MG/ML IV SOLN
INTRAVENOUS | Status: DC | PRN
  Administered 2015-08-15: 40 mg via INTRAVENOUS
  Administered 2015-08-15: 60 mg via INTRAVENOUS

## 2015-08-15 MED ORDER — VITAMIN C 500 MG PO TABS
1000.0000 mg | ORAL_TABLET | Freq: Two times a day (BID) | ORAL | Status: DC
  Administered 2015-08-15 – 2015-08-16 (×3): 1000 mg via ORAL
  Filled 2015-08-15 (×3): qty 2

## 2015-08-15 MED ORDER — ENOXAPARIN SODIUM 40 MG/0.4ML ~~LOC~~ SOLN
40.0000 mg | SUBCUTANEOUS | Status: DC
  Administered 2015-08-16: 40 mg via SUBCUTANEOUS
  Filled 2015-08-15: qty 0.4

## 2015-08-15 MED ORDER — CHLORHEXIDINE GLUCONATE 4 % EX LIQD: 1.0000 "application " | Freq: Once | CUTANEOUS | Status: DC

## 2015-08-15 MED ORDER — OXYCODONE-ACETAMINOPHEN 5-325 MG PO TABS
ORAL_TABLET | ORAL | Status: AC
  Filled 2015-08-15: qty 2

## 2015-08-15 MED ORDER — PROPOFOL 10 MG/ML IV BOLUS
INTRAVENOUS | Status: AC
  Filled 2015-08-15: qty 40

## 2015-08-15 MED ORDER — HYDROMORPHONE HCL 1 MG/ML IJ SOLN: 1.0000 mg | INTRAMUSCULAR | Status: DC | PRN

## 2015-08-15 MED ORDER — FENTANYL CITRATE (PF) 100 MCG/2ML IJ SOLN
25.0000 ug | INTRAMUSCULAR | Status: DC | PRN
Start: 2015-08-15 — End: 2015-08-15
  Administered 2015-08-15 (×3): 50 ug via INTRAVENOUS

## 2015-08-15 MED ORDER — RISAQUAD PO CAPS
2.0000 | ORAL_CAPSULE | Freq: Two times a day (BID) | ORAL | Status: DC
  Administered 2015-08-15 – 2015-08-16 (×3): 2 via ORAL
  Filled 2015-08-15 (×3): qty 2

## 2015-08-15 MED ORDER — POLYETHYLENE GLYCOL 3350 17 G PO PACK: 17.0000 g | PACK | Freq: Every day | ORAL | Status: DC | PRN

## 2015-08-15 MED ORDER — FENTANYL CITRATE (PF) 100 MCG/2ML IJ SOLN
INTRAMUSCULAR | Status: AC
  Administered 2015-08-15: 50 ug via INTRAVENOUS
  Filled 2015-08-15: qty 2

## 2015-08-15 MED ORDER — FENTANYL CITRATE (PF) 100 MCG/2ML IJ SOLN
INTRAMUSCULAR | Status: DC | PRN
  Administered 2015-08-15: 100 ug via INTRAVENOUS
  Administered 2015-08-15 (×3): 50 ug via INTRAVENOUS

## 2015-08-15 MED ORDER — ONDANSETRON HCL 4 MG/2ML IJ SOLN: 4.0000 mg | Freq: Four times a day (QID) | INTRAMUSCULAR | Status: DC | PRN

## 2015-08-15 MED ORDER — METHOCARBAMOL 500 MG PO TABS
500.0000 mg | ORAL_TABLET | Freq: Four times a day (QID) | ORAL | Status: DC | PRN
  Administered 2015-08-15 (×2): 500 mg via ORAL
  Filled 2015-08-15: qty 1

## 2015-08-15 MED ORDER — ONDANSETRON HCL 4 MG/2ML IJ SOLN: 4.0000 mg | Freq: Once | INTRAMUSCULAR | Status: DC | PRN

## 2015-08-15 MED ORDER — BUPIVACAINE-EPINEPHRINE (PF) 0.5% -1:200000 IJ SOLN
INTRAMUSCULAR | Status: DC | PRN
  Administered 2015-08-15 (×2): 20 mL via PERINEURAL

## 2015-08-15 MED ORDER — ROCURONIUM BROMIDE 100 MG/10ML IV SOLN: INTRAVENOUS | Status: DC | PRN

## 2015-08-15 MED ORDER — ONDANSETRON 4 MG PO TBDP: 4.0000 mg | ORAL_TABLET | Freq: Four times a day (QID) | ORAL | Status: DC | PRN

## 2015-08-15 MED ORDER — FENTANYL CITRATE (PF) 250 MCG/5ML IJ SOLN
INTRAMUSCULAR | Status: AC
  Filled 2015-08-15: qty 5

## 2015-08-15 MED ORDER — PHENYLEPHRINE HCL 10 MG/ML IJ SOLN
INTRAMUSCULAR | Status: DC | PRN
  Administered 2015-08-15 (×2): 80 ug via INTRAVENOUS
  Administered 2015-08-15 (×2): 40 ug via INTRAVENOUS
  Administered 2015-08-15: 80 ug via INTRAVENOUS
  Administered 2015-08-15: 40 ug via INTRAVENOUS
  Administered 2015-08-15 (×4): 80 ug via INTRAVENOUS

## 2015-08-15 MED ORDER — DEXTROSE 5 % IV SOLN
10.0000 mg | INTRAVENOUS | Status: DC | PRN
  Administered 2015-08-15: 50 ug/min via INTRAVENOUS

## 2015-08-15 MED ORDER — NEOSTIGMINE METHYLSULFATE 10 MG/10ML IV SOLN
INTRAVENOUS | Status: DC | PRN
  Administered 2015-08-15: 4 mg via INTRAVENOUS

## 2015-08-15 MED ORDER — MIDAZOLAM HCL 5 MG/5ML IJ SOLN
INTRAMUSCULAR | Status: DC | PRN
  Administered 2015-08-15 (×2): 1 mg via INTRAVENOUS

## 2015-08-15 MED ORDER — BUPIVACAINE-EPINEPHRINE 0.5% -1:200000 IJ SOLN
INTRAMUSCULAR | Status: DC | PRN
  Administered 2015-08-15: 18 mL

## 2015-08-15 MED ORDER — METHOCARBAMOL 500 MG PO TABS
ORAL_TABLET | ORAL | Status: AC
  Filled 2015-08-15: qty 1

## 2015-08-15 MED ORDER — BUPIVACAINE-EPINEPHRINE (PF) 0.5% -1:200000 IJ SOLN
INTRAMUSCULAR | Status: AC
  Filled 2015-08-15: qty 30

## 2015-08-15 MED ORDER — PROPOFOL 10 MG/ML IV BOLUS
INTRAVENOUS | Status: DC | PRN
  Administered 2015-08-15: 160 mg via INTRAVENOUS
  Administered 2015-08-15: 30 mg via INTRAVENOUS

## 2015-08-15 MED ORDER — OXYCODONE-ACETAMINOPHEN 5-325 MG PO TABS
1.0000 | ORAL_TABLET | ORAL | Status: DC | PRN
  Administered 2015-08-15 – 2015-08-16 (×4): 2 via ORAL
  Filled 2015-08-15 (×4): qty 2

## 2015-08-15 MED ORDER — FENTANYL CITRATE (PF) 100 MCG/2ML IJ SOLN
INTRAMUSCULAR | Status: AC
  Filled 2015-08-15: qty 2

## 2015-08-15 MED ORDER — 0.9 % SODIUM CHLORIDE (POUR BTL) OPTIME
TOPICAL | Status: DC | PRN
  Administered 2015-08-15: 1000 mL

## 2015-08-15 MED ORDER — PROBIOTIC ACIDOPHILUS BIOBEADS PO CAPS: 2.0000 | ORAL_CAPSULE | Freq: Two times a day (BID) | ORAL | Status: DC

## 2015-08-15 MED ORDER — MIDAZOLAM HCL 2 MG/2ML IJ SOLN
INTRAMUSCULAR | Status: AC
  Filled 2015-08-15: qty 2

## 2015-08-15 MED ORDER — GLYCOPYRROLATE 0.2 MG/ML IJ SOLN
INTRAMUSCULAR | Status: DC | PRN
  Administered 2015-08-15: .6 mg via INTRAVENOUS

## 2015-08-15 MED ORDER — BISACODYL 5 MG PO TBEC: 5.0000 mg | DELAYED_RELEASE_TABLET | Freq: Every day | ORAL | Status: DC | PRN

## 2015-08-15 SURGICAL SUPPLY — 55 items
BLADE SURG ROTATE 9660 (MISCELLANEOUS) IMPLANT
CANISTER SUCTION 2500CC (MISCELLANEOUS) IMPLANT
CHLORAPREP W/TINT 26ML (MISCELLANEOUS) ×4 IMPLANT
COVER SURGICAL LIGHT HANDLE (MISCELLANEOUS) ×4 IMPLANT
DERMABOND ADVANCED (GAUZE/BANDAGES/DRESSINGS) ×4
DERMABOND ADVANCED .7 DNX12 (GAUZE/BANDAGES/DRESSINGS) ×4 IMPLANT
DRAIN PENROSE 1/2X12 LTX STRL (WOUND CARE) IMPLANT
DRAPE LAPAROTOMY TRNSV 102X78 (DRAPE) ×4 IMPLANT
DRAPE UTILITY XL STRL (DRAPES) ×8 IMPLANT
ELECT CAUTERY BLADE 6.4 (BLADE) ×4 IMPLANT
ELECT REM PT RETURN 9FT ADLT (ELECTROSURGICAL) ×4
ELECTRODE REM PT RTRN 9FT ADLT (ELECTROSURGICAL) ×2 IMPLANT
GLOVE BIO SURGEON STRL SZ7.5 (GLOVE) ×4 IMPLANT
GLOVE BIOGEL PI IND STRL 7.0 (GLOVE) ×2 IMPLANT
GLOVE BIOGEL PI IND STRL 7.5 (GLOVE) ×2 IMPLANT
GLOVE BIOGEL PI IND STRL 8 (GLOVE) ×6 IMPLANT
GLOVE BIOGEL PI INDICATOR 7.0 (GLOVE) ×2
GLOVE BIOGEL PI INDICATOR 7.5 (GLOVE) ×2
GLOVE BIOGEL PI INDICATOR 8 (GLOVE) ×6
GLOVE EUDERMIC 7 POWDERFREE (GLOVE) ×4 IMPLANT
GLOVE SURG SS PI 7.0 STRL IVOR (GLOVE) ×12 IMPLANT
GLOVE SURG SS PI 8.0 STRL IVOR (GLOVE) ×4 IMPLANT
GOWN STRL REUS W/ TWL LRG LVL3 (GOWN DISPOSABLE) ×8 IMPLANT
GOWN STRL REUS W/ TWL XL LVL3 (GOWN DISPOSABLE) ×2 IMPLANT
GOWN STRL REUS W/TWL LRG LVL3 (GOWN DISPOSABLE) ×8
GOWN STRL REUS W/TWL XL LVL3 (GOWN DISPOSABLE) ×2
KIT BASIN OR (CUSTOM PROCEDURE TRAY) ×4 IMPLANT
KIT ROOM TURNOVER OR (KITS) ×4 IMPLANT
LIQUID BAND (GAUZE/BANDAGES/DRESSINGS) ×4 IMPLANT
MESH ULTRAPRO 3X6 7.6X15CM (Mesh General) ×8 IMPLANT
NEEDLE HYPO 25GX1X1/2 BEV (NEEDLE) ×4 IMPLANT
NS IRRIG 1000ML POUR BTL (IV SOLUTION) ×4 IMPLANT
PACK SURGICAL SETUP 50X90 (CUSTOM PROCEDURE TRAY) ×4 IMPLANT
PAD ARMBOARD 7.5X6 YLW CONV (MISCELLANEOUS) ×4 IMPLANT
PENCIL BUTTON HOLSTER BLD 10FT (ELECTRODE) ×4 IMPLANT
SPONGE INTESTINAL PEANUT (DISPOSABLE) IMPLANT
SPONGE LAP 18X18 X RAY DECT (DISPOSABLE) ×4 IMPLANT
SUT MNCRL AB 4-0 PS2 18 (SUTURE) ×8 IMPLANT
SUT PROLENE 2 0 CT2 30 (SUTURE) ×24 IMPLANT
SUT SILK 2 0 (SUTURE) ×4
SUT SILK 2 0 SH (SUTURE) IMPLANT
SUT SILK 2-0 18XBRD TIE 12 (SUTURE) ×4 IMPLANT
SUT VIC AB 2-0 CT1 27 (SUTURE) ×4
SUT VIC AB 2-0 CT1 36 (SUTURE) ×8 IMPLANT
SUT VIC AB 2-0 CT1 TAPERPNT 27 (SUTURE) ×4 IMPLANT
SUT VIC AB 3-0 SH 27 (SUTURE) ×4
SUT VIC AB 3-0 SH 27XBRD (SUTURE) ×4 IMPLANT
SUT VICRYL AB 2 0 TIES (SUTURE) ×4 IMPLANT
SYR BULB 3OZ (MISCELLANEOUS) ×4 IMPLANT
SYR CONTROL 10ML LL (SYRINGE) ×4 IMPLANT
TOWEL OR 17X24 6PK STRL BLUE (TOWEL DISPOSABLE) ×4 IMPLANT
TOWEL OR 17X26 10 PK STRL BLUE (TOWEL DISPOSABLE) ×4 IMPLANT
TUBE CONNECTING 12'X1/4 (SUCTIONS)
TUBE CONNECTING 12X1/4 (SUCTIONS) IMPLANT
YANKAUER SUCT BULB TIP NO VENT (SUCTIONS) IMPLANT

## 2015-08-15 NOTE — Anesthesia Preprocedure Evaluation (Addendum)
Anesthesia Evaluation  Patient identified by MRN, date of birth, ID band Patient awake    Reviewed: Allergy & Precautions, NPO status , Patient's Chart, lab work & pertinent test results  History of Anesthesia Complications Negative for: history of anesthetic complications  Airway Mallampati: II  TM Distance: >3 FB Neck ROM: Full    Dental  (+) Poor Dentition, Dental Advisory Given, Teeth Intact   Pulmonary neg shortness of breath, neg sleep apnea, COPD, neg recent URI, Current Smoker,    breath sounds clear to auscultation       Cardiovascular negative cardio ROS   Rhythm:Regular     Neuro/Psych  Headaches, neg Seizures negative psych ROS   GI/Hepatic negative GI ROS, Neg liver ROS,   Endo/Other  negative endocrine ROS  Renal/GU negative Renal ROS     Musculoskeletal  (+) Arthritis ,   Abdominal   Peds  Hematology negative hematology ROS (+)   Anesthesia Other Findings   Reproductive/Obstetrics                            Anesthesia Physical Anesthesia Plan  ASA: II  Anesthesia Plan: General and Regional   Post-op Pain Management:    Induction: Intravenous  Airway Management Planned: LMA and Oral ETT  Additional Equipment: None  Intra-op Plan:   Post-operative Plan: Extubation in OR  Informed Consent: I have reviewed the patients History and Physical, chart, labs and discussed the procedure including the risks, benefits and alternatives for the proposed anesthesia with the patient or authorized representative who has indicated his/her understanding and acceptance.   Dental advisory given  Plan Discussed with: Surgeon  Anesthesia Plan Comments:         Anesthesia Quick Evaluation

## 2015-08-15 NOTE — Interval H&P Note (Signed)
History and Physical Interval Note:  08/15/2015 6:16 AM  [name]  has presented today for surgery, with the diagnosis of Recurrent right inguinal hernia, left inguinal hernia  The various methods of treatment have been discussed with the patient and family. After consideration of risks, benefits and other options for treatment, the patient has consented to  Procedure(s): OPEN REPAIR RECURRENT RIGHT INGUINAL HERNIA WITH MESH AND REPAIR LEFT INGUINAL HERNIA WITH MESH (Bilateral) INSERTION OF MESH (Bilateral) as a surgical intervention .  The patient's history has been reviewed, patient examined, no change in status, stable for surgery.  I have reviewed the patient's chart and labs.  Questions were answered to the patient's satisfaction.     [name]

## 2015-08-15 NOTE — Anesthesia Postprocedure Evaluation (Signed)
Anesthesia Post Note  Patient: [name]  Procedure(s) Performed: Procedure(s) (LRB): OPEN REPAIR RECURRENT RIGHT INGUINAL HERNIA WITH MESH AND REPAIR LEFT INGUINAL HERNIA WITH MESH (Bilateral) INSERTION OF MESH (Bilateral)  Patient location during evaluation: PACU Anesthesia Type: General and Regional Level of consciousness: awake and alert Pain management: pain level controlled Vital Signs Assessment: post-procedure vital signs reviewed and stable Respiratory status: spontaneous breathing, nonlabored ventilation, respiratory function stable and patient connected to nasal cannula oxygen Cardiovascular status: blood pressure returned to baseline and stable Postop Assessment: no signs of nausea or vomiting Anesthetic complications: no Comments: GA plus bilateral TAP blocks    Last Vitals:  Filed Vitals:   08/15/15 1200 08/15/15 1246  BP: 104/65 105/66  Pulse: 57 67  Temp: 36.6 C 37 C  Resp: 17 16    Last Pain:  Filed Vitals:   08/15/15 1247  PainSc: 2                  Catalina Gravel

## 2015-08-15 NOTE — Anesthesia Procedure Notes (Addendum)
Anesthesia Regional Block:  TAP block  Pre-Anesthetic Checklist: ,, timeout performed, Correct Patient, Correct Site, Correct Laterality, Correct Procedure, Correct Position, risks and benefits discussed, surgical consent, pre-op evaluation,  At surgeon's request and post-op pain management  Laterality: Right  Prep: chloraprep       Needles:  Injection technique: Single-shot  Needle Type: Echogenic Needle          Additional Needles:  Procedures: ultrasound guided (picture in chart) TAP block Narrative:  Injection made incrementally with aspirations every 5 mL.  Performed by: Personally   Additional Notes: H+P and labs reviewed, risks and benefits discussed with patient, procedure tolerated well without complications   Anesthesia Regional Block:  TAP block  Pre-Anesthetic Checklist: ,, timeout performed, Correct Patient, Correct Site, Correct Laterality, Correct Procedure, Correct Position, risks and benefits discussed, surgical consent, pre-op evaluation,  At surgeon's request and post-op pain management  Laterality: Left  Prep: chloraprep       Needles:  Injection technique: Single-shot  Needle Type: Echogenic Needle          Additional Needles:  Procedures: ultrasound guided (picture in chart) TAP block Narrative:  Injection made incrementally with aspirations every 5 mL.  Performed by: Personally   Additional Notes: H+P and labs reviewed, risks and benefits discussed with patient, procedure tolerated well without complications   Procedure Name: Intubation Date/Time: 08/15/2015 7:53 AM Performed by: Merdis Delay Pre-anesthesia Checklist: Patient identified, Timeout performed, Emergency Drugs available, Suction available and Patient being monitored Patient Re-evaluated:Patient Re-evaluated prior to inductionOxygen Delivery Method: Circle system utilized Intubation Type: IV induction Ventilation: Mask ventilation without difficulty and Oral  airway inserted - appropriate to patient size Laryngoscope Size: Mac and 4 Grade View: Grade III Tube type: Oral Tube size: 7.5 mm Number of attempts: 1 Airway Equipment and Method: Stylet Placement Confirmation: ETT inserted through vocal cords under direct vision,  breath sounds checked- equal and bilateral,  positive ETCO2 and CO2 detector Secured at: 22 cm Tube secured with: Tape Dental Injury: Teeth and Oropharynx as per pre-operative assessment  Difficulty Due To: Difficulty was unanticipated Comments: MAC 3- no view. Switched to MAC 4- no view even with cricoid pressure. Blind intubation. Use glidescope in future.

## 2015-08-15 NOTE — Progress Notes (Addendum)
Pt hasnt voided since surgery this am, did a bladderscan  with result of 829m, called Dr. TGeorgette Dover in formed with order of in and out cath.

## 2015-08-15 NOTE — Transfer of Care (Signed)
Immediate Anesthesia Transfer of Care Note  Patient: [name]  Procedure(s) Performed: Procedure(s): OPEN REPAIR RECURRENT RIGHT INGUINAL HERNIA WITH MESH AND REPAIR LEFT INGUINAL HERNIA WITH MESH (Bilateral) INSERTION OF MESH (Bilateral)  Patient Location: PACU  Anesthesia Type:General  Level of Consciousness: awake, alert, confused  Airway & Oxygen Therapy: Patient Spontanous Breathing and Patient connected to face mask oxygen  Post-op Assessment: Report given to RN and Post -op Vital signs reviewed and stable  Post vital signs: Reviewed and stable  Last Vitals:  Filed Vitals:   08/15/15 0633  BP: 94/71  Pulse: 92  Resp: 18    Complications: No apparent anesthesia complications

## 2015-08-15 NOTE — Op Note (Signed)
Patient Name:           [name]   Date of Surgery:        08/15/2015  Pre op Diagnosis:      recurrent right inguinal hernia, indirect left inguinal hernia  Post op Diagnosis:    Recurrent right inguinal hernia, direct recurrence                                      Left inguinal hernia, combined  indirect and direct type  Procedure:                 Open repair recurrent right inguinal hernia with mesh                                      Open repair left inguinal hernia with mesh  Surgeon:                     Edsel Petrin. Dalbert Batman, M.D., FACS  Assistant:                      Sharyn Dross, RNFA  Operative Indications:   This is a 70 year old male who presents for evaluation of inguinal hernias.     In approximately 2004 at the [facility] in [place] he underwent open repair of right inguinal hernia.  I have reviewed the operative note which describes a standard Bassini repair without mesh. He states he had severe pain postop period had to be readmitted. He ultimately healed. He now states that he has had a bulge in his right groin for over 5 years. He's had a bulge in his left groin for one year. The left side is much larger than the right.      Comorbidities include active tobacco use. COPD with inhalers. History of prostate cancer treated with some type of transurethral procedure followed by Dr. Vale Haven, urology, [facility]. History of arthritis. History of pneumonia..   I'm going to approach these from an anterior open approach because the left side is so large. I do not think he has femoral hernias.  Operative Findings:       On the right side he had an obvious recurrent inguinal hernia which was a direct type, medial to the internal ring.  There was no evidence of indirect hernia.  The femoral space felt normal and I do not think he had a femoral hernia area;  there was chronic scar tissue as expected.  On the right side I repaired this as a Magazine features editor    On the  left side he had a large indirect hernia.  Upon opening the sac I can see the colon which had to be pulled up and adhesions had to be taken down into the colon was returned to the peritoneal cavity.  He also had a direct bulge.  There was no evidence of femoral hernia on the left side either.  The left side was repaired as a Magazine features editor  Procedure in Detail:          Following the induction of general endotracheal anesthesia the patient underwent bilateral TAPP  block by the anesthesia department.  Surgical timeout was performed.  Intravenous antibiotics were given.  The abdomen and groins and genitalia were prepped and draped in  a sterile fashion.  0.5% Marcaine with epinephrine was used as local infiltration anesthetic for skin and subcutaneous tissue.      Right groin incision was made through the old scar and dissection was carried down through the subcutaneous tissue.  I carefully dissected through the scar tissue exposing the aponeurosis of the external oblique.  I found the external ring.  I incised the external oblique in the direction of fibers and slowly opened up external inguinal ring completely opened up.  One small sensory nerve was clamped divided and ligated with 2-0 silk ties.  I dissected the external oblique away from the surrounding tissues and placed self-retaining retractors.  I continue to dissected medially until I could get around the cord structures.  I found the direct hernia sac and was able to separate that completely from the cord structures and then simply reduce it and oversewed it with a Vicryl suture.  I checked for femoral hernia.  There was none.  I feel Coopers ligament and there is no bulge or abnormality down there.  I checked for indirect hernia sac and there was none.  I brought a 3" x 6" piece of ultra Pro mesh to the operative field, trimmed at the corners and was sutured in place with interrupted sutures of 2-0 Prolene.  The mesh was sutured so as to generously  overlap the fascia at the pubic tubercle, then along the inguinal ligament inferiorly.  Also placed interrupted mattress sutures medially, superiorly, and superiorly laterally.  The mesh was incised laterally so as to wraparound the cord structures at the internal ring.  The tails of the mesh were overlapped laterally and further Prolene sutures were placed laterally.  This provided very secure coverage and repair both medial and lateral to the internal ring but allowed an adequate fingertip opening for the cord structures.  After irrigating the wound I closed the external oblique with a running suture of 2-0 Vicryl, Scarpa's fascia with 3-0 Vicryl and the skin with a running subcuticular 4-0 Monocryl and Dermabond.      I then made a mirror image incision on the left side.  Dissection was carried down exposing the external oblique , which I  incised and dissected away from the underlying tissues.  The cord structures were mobilized and encircled with a penrose drain.  Large indirect sac was dissected away from the cord structures.  This was opened with findings as described above.  I took down all of the  Adhesions on the interior of the sac, returning the colon to the abdominal cavity.  The indirect sac was closed with the Purstring suture of 2-0 Vicryl at the level of the internal ring.  This went well and the redundant sac was excised and discarded. There was a smaller but obvious direct bulge.  The floor of the inguinal canal was repaired and reinforced with a 3" x 6" piece of ultra Pro mesh in similar fashion to the left side.  Wide coverage both medially and laterally as a Magazine features editor.  Wound was irrigated.  External oblique was closed with 2-0 Vicryl sutures placing the cord structures deep to the external oblique.  Scarpa's fascia was closed with 3-0 Vicryl and the skin closed with a running 4-0 Monocryl and Dermabond.      The patient tolerated the procedure well was taken to PACU in stable  condition.  EBL 20 mL.  Counts correct.  Complications none.       Edsel Petrin. Dalbert Batman, M.D., FACS  General and Minimally Invasive Surgery Breast and Colorectal Surgery  08/15/2015 9:34 AM

## 2015-08-16 ENCOUNTER — Encounter (HOSPITAL_COMMUNITY): Payer: Self-pay | Admitting: General Surgery

## 2015-08-16 DIAGNOSIS — Z7951 Long term (current) use of inhaled steroids: Secondary | ICD-10-CM | POA: Diagnosis not present

## 2015-08-16 DIAGNOSIS — M1990 Unspecified osteoarthritis, unspecified site: Secondary | ICD-10-CM | POA: Diagnosis not present

## 2015-08-16 DIAGNOSIS — K4091 Unilateral inguinal hernia, without obstruction or gangrene, recurrent: Secondary | ICD-10-CM | POA: Diagnosis not present

## 2015-08-16 DIAGNOSIS — Z8249 Family history of ischemic heart disease and other diseases of the circulatory system: Secondary | ICD-10-CM | POA: Diagnosis not present

## 2015-08-16 DIAGNOSIS — Z885 Allergy status to narcotic agent status: Secondary | ICD-10-CM | POA: Diagnosis not present

## 2015-08-16 DIAGNOSIS — Z800 Family history of malignant neoplasm of digestive organs: Secondary | ICD-10-CM | POA: Diagnosis not present

## 2015-08-16 DIAGNOSIS — K4090 Unilateral inguinal hernia, without obstruction or gangrene, not specified as recurrent: Secondary | ICD-10-CM | POA: Diagnosis not present

## 2015-08-16 DIAGNOSIS — J449 Chronic obstructive pulmonary disease, unspecified: Secondary | ICD-10-CM | POA: Diagnosis not present

## 2015-08-16 DIAGNOSIS — Z7982 Long term (current) use of aspirin: Secondary | ICD-10-CM | POA: Diagnosis not present

## 2015-08-16 DIAGNOSIS — Z79899 Other long term (current) drug therapy: Secondary | ICD-10-CM | POA: Diagnosis not present

## 2015-08-16 DIAGNOSIS — R339 Retention of urine, unspecified: Secondary | ICD-10-CM | POA: Diagnosis not present

## 2015-08-16 MED ORDER — OXYCODONE-ACETAMINOPHEN 5-325 MG PO TABS: 1.0000 | ORAL_TABLET | ORAL | Status: DC | PRN

## 2015-08-16 MED ORDER — TAMSULOSIN HCL 0.4 MG PO CAPS
0.4000 mg | ORAL_CAPSULE | Freq: Every day | ORAL | Status: DC
  Administered 2015-08-16: 0.4 mg via ORAL
  Filled 2015-08-16: qty 1

## 2015-08-16 MED ORDER — POLYETHYLENE GLYCOL 3350 17 G PO PACK
17.0000 g | PACK | Freq: Two times a day (BID) | ORAL | Status: DC
  Administered 2015-08-16: 17 g via ORAL
  Filled 2015-08-16: qty 1

## 2015-08-16 MED ORDER — SENNA 8.6 MG PO TABS
1.0000 | ORAL_TABLET | Freq: Two times a day (BID) | ORAL | Status: DC
  Administered 2015-08-16: 8.6 mg via ORAL
  Filled 2015-08-16: qty 1

## 2015-08-16 MED ORDER — TAMSULOSIN HCL 0.4 MG PO CAPS: 0.4000 mg | ORAL_CAPSULE | Freq: Every day | ORAL | Status: DC

## 2015-08-16 NOTE — Progress Notes (Signed)
AVS given. IV removed. Understanding demonstrated. Leg bag attached to foley. Transportation with wife. Belongings packed.

## 2015-08-16 NOTE — Discharge Summary (Signed)
Patient ID: [name] [id number] 70 y.o. [birth year]  Admit date: 08/15/2015  Discharge date and time: 08/16/2015  Admitting Physician: [name]  Discharge Physician: [name]  Admission Diagnoses: Recurrent right inguinal hernia, left inguinal hernia  Discharge Diagnoses: Recurrent right inguinal hernia                                         Left inguinal hernia                                         Acute urinary retention                                         History prostate cancer                                         COPD                                         Current cigarette smoker  Operations: Procedure(s): OPEN REPAIR RECURRENT RIGHT INGUINAL HERNIA WITH MESH AND REPAIR LEFT INGUINAL HERNIA WITH MESH INSERTION OF MESH  Admission Condition: good  Discharged Condition: good  Indication for Admission: This is a 70 year old male who presents for evaluation of inguinal hernias.  In approximately 2004 at the [facility] in [place] he underwent open repair of right inguinal hernia. I have reviewed the operative note which describes a standard Bassini repair without mesh. He states he had severe pain postop period had to be readmitted. He ultimately healed. He now states that he has had a bulge in his right groin for over 5 years. He's had a bulge in his left groin for one year. The left side is much larger than the right.   Comorbidities include active tobacco use. COPD with inhalers. History of prostate cancer treated with some type of transurethral procedure followed by Dr. Vale Haven, urology, [facility]. History of arthritis. History of pneumonia..  I'm going to approach these from an anterior open approach because the left side is so large. I do not think he has femoral hernias.  Hospital Course: On the day of admission the patient was taken to the operating room and underwent repair of his bilateral inguinal hernias with open  technique.        Operative findings revealed On the right side he had an obvious recurrent inguinal hernia which was a direct type, medial to the internal ring. There was no evidence of indirect hernia. The femoral space felt normal and I do not think he had a femoral hernia area; there was chronic scar tissue as expected. On the right side I repaired this as a Magazine features editor On the left side he had a large indirect hernia. Upon opening the sac I can see the colon which had to be pulled up and adhesions had to be taken down into the colon was returned to the peritoneal cavity. He also had a direct bulge. There was no evidence of  femoral hernia on the left side either. The left side was repaired as a Magazine features editor     Postoperatively the patient did fairly well but had problems with urinary retention.  Bladder scan during the night revealed 800 mL an in and out catheter revealed 800 mL of urine.  The morning after surgery his bladder scan revealed over 400 mL and we decided to put a Foley catheter in and send him home with this.  It was his strong desire to go home.  He was otherwise ambulating in the halls and tolerating a diet.     Pain control is good. Both wounds looked fine the morning after surgery.  Minimal edema.  Ecchymoses on the penis.  Hernia repair is intact.  Abdomen soft.     I gave him a prescription for Percocet and a prescription for Flomax.  He stated that he did not want to drive to [facility] to see his urologist in follow-up and so we are going to make an arrangement for him to be seen at [facility] in approximately 5 days for management of the Foley and bladder emptying function.  Diet and activities were discussed.  He was told to otherwise continue his usual medications.  He was advised to take MiraLAX once a day and Senokot twice a day and to stay well hydrated.  Consults: None  Significant Diagnostic Studies: none  Treatments: surgery: Open repair  recurrent right inguinal hernia, open repair left inguinal hernia, mesh implantation bilaterally  Disposition: Home  Patient Instructions:    Medication List    TAKE these medications        aspirin 81 MG tablet  Take 81 mg by mouth daily. Reported on 05/08/2015     fluocinonide-emollient 0.05 % cream  Commonly known as:  LIDEX-E  Apply 1 application topically daily as needed (for skin).     ibuprofen 200 MG tablet  Commonly known as:  ADVIL,MOTRIN  Take 800 mg by mouth 2 (two) times daily as needed for moderate pain (for arthritiis).     oxyCODONE-acetaminophen 5-325 MG tablet  Commonly known as:  PERCOCET/ROXICET  Take 1-2 tablets by mouth every 4 (four) hours as needed for moderate pain.     PROBIOTIC ACIDOPHILUS PO  Take 2 tablets by mouth daily.     tamsulosin 0.4 MG Caps capsule  Commonly known as:  FLOMAX  Take 1 capsule (0.4 mg total) by mouth daily after breakfast.     vitamin C 1000 MG tablet  Take 1,000 mg by mouth 2 (two) times daily.        Activity: Ambulate frequently.  No lifting more than 15 pounds.  No driving.  Okay to shower Diet: low fat, low cholesterol diet Wound Care: none needed  Follow-up:  With Dr. Dalbert Batman in 2 weeks.  Signed: Edsel Petrin. Dalbert Batman, M.D., FACS General and minimally invasive surgery Breast and Colorectal Surgery  08/16/2015, 7:03 AM

## 2015-08-16 NOTE — Discharge Instructions (Signed)
(  see above 

## 2015-08-21 DIAGNOSIS — R338 Other retention of urine: Secondary | ICD-10-CM | POA: Diagnosis not present

## 2016-01-17 DIAGNOSIS — C61 Malignant neoplasm of prostate: Secondary | ICD-10-CM | POA: Diagnosis not present

## 2016-05-11 DIAGNOSIS — J181 Lobar pneumonia, unspecified organism: Secondary | ICD-10-CM | POA: Diagnosis not present

## 2016-05-13 ENCOUNTER — Emergency Department (HOSPITAL_COMMUNITY): Payer: Medicare Other

## 2016-05-13 ENCOUNTER — Encounter (HOSPITAL_COMMUNITY): Payer: Self-pay | Admitting: Family Medicine

## 2016-05-13 ENCOUNTER — Inpatient Hospital Stay (HOSPITAL_COMMUNITY)
Admission: EM | Admit: 2016-05-13 | Discharge: 2016-05-31 | DRG: 163 | Disposition: A | Discharge status: Home Health | Payer: Medicare Other | Attending: Family Medicine | Admitting: Family Medicine

## 2016-05-13 DIAGNOSIS — E2740 Unspecified adrenocortical insufficiency: Secondary | ICD-10-CM | POA: Diagnosis present

## 2016-05-13 DIAGNOSIS — I509 Heart failure, unspecified: Secondary | ICD-10-CM | POA: Diagnosis not present

## 2016-05-13 DIAGNOSIS — I4891 Unspecified atrial fibrillation: Secondary | ICD-10-CM | POA: Diagnosis not present

## 2016-05-13 DIAGNOSIS — Z720 Tobacco use: Secondary | ICD-10-CM | POA: Diagnosis present

## 2016-05-13 DIAGNOSIS — E860 Dehydration: Secondary | ICD-10-CM | POA: Diagnosis present

## 2016-05-13 DIAGNOSIS — Z85828 Personal history of other malignant neoplasm of skin: Secondary | ICD-10-CM

## 2016-05-13 DIAGNOSIS — D689 Coagulation defect, unspecified: Secondary | ICD-10-CM | POA: Diagnosis not present

## 2016-05-13 DIAGNOSIS — R0600 Dyspnea, unspecified: Secondary | ICD-10-CM

## 2016-05-13 DIAGNOSIS — I2729 Other secondary pulmonary hypertension: Secondary | ICD-10-CM | POA: Diagnosis present

## 2016-05-13 DIAGNOSIS — I5031 Acute diastolic (congestive) heart failure: Secondary | ICD-10-CM | POA: Diagnosis present

## 2016-05-13 DIAGNOSIS — Z7982 Long term (current) use of aspirin: Secondary | ICD-10-CM | POA: Diagnosis not present

## 2016-05-13 DIAGNOSIS — J449 Chronic obstructive pulmonary disease, unspecified: Secondary | ICD-10-CM | POA: Diagnosis present

## 2016-05-13 DIAGNOSIS — Z9689 Presence of other specified functional implants: Secondary | ICD-10-CM

## 2016-05-13 DIAGNOSIS — E876 Hypokalemia: Secondary | ICD-10-CM | POA: Diagnosis not present

## 2016-05-13 DIAGNOSIS — I481 Persistent atrial fibrillation: Secondary | ICD-10-CM | POA: Diagnosis not present

## 2016-05-13 DIAGNOSIS — E871 Hypo-osmolality and hyponatremia: Secondary | ICD-10-CM | POA: Diagnosis present

## 2016-05-13 DIAGNOSIS — N400 Enlarged prostate without lower urinary tract symptoms: Secondary | ICD-10-CM | POA: Diagnosis present

## 2016-05-13 DIAGNOSIS — R600 Localized edema: Secondary | ICD-10-CM | POA: Diagnosis not present

## 2016-05-13 DIAGNOSIS — I2781 Cor pulmonale (chronic): Secondary | ICD-10-CM | POA: Diagnosis present

## 2016-05-13 DIAGNOSIS — R918 Other nonspecific abnormal finding of lung field: Secondary | ICD-10-CM | POA: Diagnosis present

## 2016-05-13 DIAGNOSIS — Z79899 Other long term (current) drug therapy: Secondary | ICD-10-CM

## 2016-05-13 DIAGNOSIS — J9620 Acute and chronic respiratory failure, unspecified whether with hypoxia or hypercapnia: Secondary | ICD-10-CM | POA: Diagnosis present

## 2016-05-13 DIAGNOSIS — J189 Pneumonia, unspecified organism: Secondary | ICD-10-CM | POA: Diagnosis present

## 2016-05-13 DIAGNOSIS — J9601 Acute respiratory failure with hypoxia: Secondary | ICD-10-CM | POA: Diagnosis not present

## 2016-05-13 DIAGNOSIS — J90 Pleural effusion, not elsewhere classified: Secondary | ICD-10-CM | POA: Diagnosis not present

## 2016-05-13 DIAGNOSIS — A419 Sepsis, unspecified organism: Secondary | ICD-10-CM | POA: Diagnosis not present

## 2016-05-13 DIAGNOSIS — J869 Pyothorax without fistula: Principal | ICD-10-CM | POA: Diagnosis present

## 2016-05-13 DIAGNOSIS — J918 Pleural effusion in other conditions classified elsewhere: Secondary | ICD-10-CM

## 2016-05-13 DIAGNOSIS — J432 Centrilobular emphysema: Secondary | ICD-10-CM | POA: Diagnosis not present

## 2016-05-13 DIAGNOSIS — E861 Hypovolemia: Secondary | ICD-10-CM | POA: Diagnosis present

## 2016-05-13 DIAGNOSIS — I248 Other forms of acute ischemic heart disease: Secondary | ICD-10-CM | POA: Diagnosis present

## 2016-05-13 DIAGNOSIS — E875 Hyperkalemia: Secondary | ICD-10-CM | POA: Diagnosis not present

## 2016-05-13 DIAGNOSIS — R6521 Severe sepsis with septic shock: Secondary | ICD-10-CM | POA: Diagnosis not present

## 2016-05-13 DIAGNOSIS — E8779 Other fluid overload: Secondary | ICD-10-CM | POA: Diagnosis not present

## 2016-05-13 DIAGNOSIS — E43 Unspecified severe protein-calorie malnutrition: Secondary | ICD-10-CM | POA: Diagnosis present

## 2016-05-13 DIAGNOSIS — J181 Lobar pneumonia, unspecified organism: Secondary | ICD-10-CM | POA: Diagnosis not present

## 2016-05-13 DIAGNOSIS — Z6825 Body mass index (BMI) 25.0-25.9, adult: Secondary | ICD-10-CM

## 2016-05-13 DIAGNOSIS — J939 Pneumothorax, unspecified: Secondary | ICD-10-CM

## 2016-05-13 DIAGNOSIS — I5082 Biventricular heart failure: Secondary | ICD-10-CM | POA: Diagnosis present

## 2016-05-13 DIAGNOSIS — K5900 Constipation, unspecified: Secondary | ICD-10-CM | POA: Diagnosis not present

## 2016-05-13 DIAGNOSIS — Z8546 Personal history of malignant neoplasm of prostate: Secondary | ICD-10-CM

## 2016-05-13 DIAGNOSIS — J9811 Atelectasis: Secondary | ICD-10-CM | POA: Diagnosis not present

## 2016-05-13 DIAGNOSIS — D6489 Other specified anemias: Secondary | ICD-10-CM | POA: Diagnosis present

## 2016-05-13 DIAGNOSIS — R0602 Shortness of breath: Secondary | ICD-10-CM

## 2016-05-13 DIAGNOSIS — I482 Chronic atrial fibrillation: Secondary | ICD-10-CM | POA: Diagnosis not present

## 2016-05-13 DIAGNOSIS — J410 Simple chronic bronchitis: Secondary | ICD-10-CM | POA: Diagnosis not present

## 2016-05-13 DIAGNOSIS — F17210 Nicotine dependence, cigarettes, uncomplicated: Secondary | ICD-10-CM | POA: Diagnosis present

## 2016-05-13 DIAGNOSIS — Z9889 Other specified postprocedural states: Secondary | ICD-10-CM

## 2016-05-13 DIAGNOSIS — I110 Hypertensive heart disease with heart failure: Secondary | ICD-10-CM | POA: Diagnosis present

## 2016-05-13 LAB — BASIC METABOLIC PANEL
Anion gap: 8 (ref 5–15)
BUN: 40 mg/dL — ABNORMAL HIGH (ref 6–20)
CALCIUM: 8.5 mg/dL — AB (ref 8.9–10.3)
CO2: 24 mmol/L (ref 22–32)
CREATININE: 1.29 mg/dL — AB (ref 0.61–1.24)
Chloride: 98 mmol/L — ABNORMAL LOW (ref 101–111)
GFR, EST NON AFRICAN AMERICAN: 54 mL/min — AB (ref >60)
Glucose, Bld: 113 mg/dL — ABNORMAL HIGH (ref 65–99)
Potassium: 4.4 mmol/L (ref 3.5–5.1)
SODIUM: 130 mmol/L — AB (ref 135–145)

## 2016-05-13 LAB — CBC WITH DIFFERENTIAL/PLATELET
BASOS PCT: 0 %
Basophils Absolute: 0 10*3/uL (ref 0.0–0.1)
EOS ABS: 0 10*3/uL (ref 0.0–0.7)
Eosinophils Relative: 0 %
HCT: 39.4 % (ref 39.0–52.0)
HEMOGLOBIN: 13.8 g/dL (ref 13.0–17.0)
Lymphocytes Relative: 6 %
Lymphs Abs: 1.1 10*3/uL (ref 0.7–4.0)
MCH: 30.6 pg (ref 26.0–34.0)
MCHC: 35 g/dL (ref 30.0–36.0)
MCV: 87.4 fL (ref 78.0–100.0)
MONO ABS: 2 10*3/uL — AB (ref 0.1–1.0)
MONOS PCT: 10 %
NEUTROS PCT: 84 %
Neutro Abs: 17 10*3/uL — ABNORMAL HIGH (ref 1.7–7.7)
Platelets: 359 10*3/uL (ref 150–400)
RBC: 4.51 MIL/uL (ref 4.22–5.81)
RDW: 15.2 % (ref 11.5–15.5)
WBC: 20.2 10*3/uL — ABNORMAL HIGH (ref 4.0–10.5)

## 2016-05-13 LAB — I-STAT TROPONIN, ED: TROPONIN I, POC: 0.04 ng/mL (ref 0.00–0.08)

## 2016-05-13 MED ORDER — FUROSEMIDE 10 MG/ML IJ SOLN: 40.0000 mg | Freq: Once | INTRAMUSCULAR | Status: DC

## 2016-05-13 NOTE — ED Triage Notes (Signed)
Patient was diagnosed with PNA two days ago at a walk-in-clinic. Prescribed Levaquin 735m daily. Pt has took two doses and noticed an increase in his lower extremity swelling. Pt normally has swelling but it has got worse.

## 2016-05-13 NOTE — ED Provider Notes (Signed)
[facility] DEPT Provider Note   CSN: [id number] Arrival date & time: 05/13/16  1833     History   Chief Complaint Chief Complaint  Patient presents with  . Leg Swelling    HPI [name] is a 71 y.o. male.  The history is provided by the patient.  Shortness of Breath  This is a new problem. The average episode lasts 6 days. The problem occurs continuously.Episode onset: 6 days ago and worsened 2 days ago. The problem has been gradually worsening. Associated symptoms include cough, orthopnea (chronic) and leg swelling (bilateral over the last 2 days up to knees). Pertinent negatives include no fever and no sputum production. Associated symptoms comments: Loss of appetite. Treatments tried: on levaquin for presumed pneumonia. The treatment provided no relief.    Past Medical History:  Diagnosis Date  . Arthritis    "neck" (08/15/2015)  . Chronic bronchitis ([place])   . Constipation   . COPD (chronic obstructive pulmonary disease) ([place])   . DDD (degenerative disc disease), cervical   . Edema of left lower extremity   . Facial basal cell cancer   . H/O acne vulgaris 1960s   "led to my discharge from the [facility] in the mid 1960s"  . Headache    history of - left temporal- years ago- not current (08/15/2015)  . Pneumonia 1960s; 2015 X 2  . Prostate cancer ([place]) dx'd early 2000s   "low spreading; non aggressive type" (08/15/2015)  . Skin cancer    "back"    Patient Active Problem List   Diagnosis Date Noted  . Acute urinary retention 08/16/2015  . Bilateral inguinal hernia 08/15/2015  . Trigger finger, acquired 11/17/2012  . 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 HEMOPTYSIS 08/02/2009  . Nonspecific (abnormal) findings on radiological and other examination of body structure 08/02/2009  . ABNORMAL LUNG XRAY 08/02/2009    Past Surgical History:  Procedure Laterality Date  . COLONOSCOPY    . EXCISIONAL HEMORRHOIDECTOMY  1960s  . INGUINAL HERNIA REPAIR Right 2002    . INGUINAL HERNIA REPAIR Bilateral 08/15/2015  . INGUINAL HERNIA REPAIR Bilateral 08/15/2015   Procedure: OPEN REPAIR RECURRENT RIGHT INGUINAL HERNIA WITH MESH AND REPAIR LEFT INGUINAL HERNIA WITH MESH;  Surgeon: [name], MD;  Location: [facility];  Service: General;  Laterality: Bilateral;  . INSERTION OF MESH Bilateral 08/15/2015   Procedure: INSERTION OF MESH;  Surgeon: [name], MD;  Location: [facility];  Service: General;  Laterality: Bilateral;  . PROSTATE BIOPSY    . TONSILLECTOMY  1950s       Home Medications    Prior to Admission medications   Medication Sig Start Date End Date Taking? Authorizing Provider  Ascorbic Acid (VITAMIN C) 1000 MG tablet Take 1,000 mg by mouth 2 (two) times daily.   Yes Historical Provider, MD  aspirin 81 MG tablet Take 81 mg by mouth daily. Reported on 05/08/2015   Yes Historical Provider, MD  fluocinonide-emollient (LIDEX-E) 0.05 % cream Apply 1 application topically daily as needed (for skin).  01/06/14  Yes Historical Provider, MD  ibuprofen (ADVIL,MOTRIN) 200 MG tablet Take 800 mg by mouth 2 (two) times daily as needed for moderate pain (for arthritiis).   Yes Historical Provider, MD  levofloxacin (LEVAQUIN) 750 MG tablet Take 750 mg by mouth daily. x5 days 05/11/16  Yes Historical Provider, MD  oxyCODONE-acetaminophen (PERCOCET/ROXICET) 5-325 MG tablet Take 1-2 tablets by mouth every 4 (four) hours as needed for moderate pain. Patient not taking: Reported on  05/13/2016 08/16/15   [name], MD  tamsulosin (FLOMAX) 0.4 MG CAPS capsule Take 1 capsule (0.4 mg total) by mouth daily after breakfast. Patient not taking: Reported on 05/13/2016 08/16/15   [name], MD    Family History History reviewed. No pertinent family history.  Social History Social History  Substance Use Topics  . Smoking status: Current Every Day Smoker    Packs/day: 1.00    Years: 62.00    Types: Cigarettes  . Smokeless tobacco: Never Used  . Alcohol use No      Allergies   Demerol [meperidine]   Review of Systems Review of Systems  Constitutional: Negative for fever.  Respiratory: Positive for cough and shortness of breath. Negative for sputum production.   Cardiovascular: Positive for orthopnea (chronic) and leg swelling (bilateral over the last 2 days up to knees).  All other systems reviewed and are negative.    Physical Exam Updated Vital Signs BP 112/69 (BP Location: Right Arm)   Pulse 91   Temp 97.7 F (36.5 C) (Oral)   Resp 18   Ht _0  (1.803 m)   Wt 180 lb (81.6 kg)   SpO2 91%   BMI 25.10 kg/m   Physical Exam  Constitutional: He is oriented to person, place, and time. He appears well-developed and well-nourished. No distress.  HENT:  Head: Normocephalic and atraumatic.  Nose: Nose normal.  Eyes: Conjunctivae are normal.  Neck: Neck supple. No tracheal deviation present.  Cardiovascular: Normal rate and regular rhythm.   Pulmonary/Chest: Effort normal. No accessory muscle usage. No respiratory distress. He has decreased breath sounds in the right lower field. He has rales (bilateral).  Abdominal: Soft. He exhibits no distension.  Musculoskeletal:  3+ b/l pitting edema to level of knees  Neurological: He is alert and oriented to person, place, and time.  Skin: Skin is warm and dry.  Psychiatric: He has a normal mood and affect.     ED Treatments / Results  Labs (all labs ordered are listed, but only abnormal results are displayed) [place], ED    EKG  EKG Interpretation None       Radiology No results found.  Procedures Procedures (including critical care time)  Medications Ordered in ED Medications - No data to display   Initial Impression / Assessment and Plan / ED Course  I have reviewed the triage vital signs and the nursing notes.  Pertinent labs & imaging results that were  available during my care of the patient were reviewed by me and considered in my medical decision making (see chart for details).  Clinical Course     70 y.o. male presents with shortness of breath progressing over the last week. He was treated for pneumonia with levaquin by his PCP office without testing and since then his chronic b/l leg edema has worsened and spread up to his knees with worsening shortness of breath. CXR with bilateral effusions and Pt now in atrial fibrillation which he has no history of. Unable to r/o pneumonia as underlying cause with evident leukocytosis so covered for resistant species. Never had fevers or sputum production desite large area of involvement. I feel this more likely represents a new onset heart failure possibly related to new diagnosis of atrial fibrillation. May need thoracentesis to further clarify diagnosis. Hospitalist was consulted for admission and will see the patient in the emergency department.   Final Clinical  Impressions(s) / ED Diagnoses   Final diagnoses:  Bilateral lower extremity edema  Bilateral pleural effusion  Atrial fibrillation, unspecified type [facility])    New Prescriptions New Prescriptions   No medications on file     Leo Grosser, MD 05/14/16 [phone]

## 2016-05-14 ENCOUNTER — Inpatient Hospital Stay (HOSPITAL_COMMUNITY): Payer: Medicare Other

## 2016-05-14 ENCOUNTER — Encounter (HOSPITAL_COMMUNITY): Payer: Self-pay | Admitting: Internal Medicine

## 2016-05-14 DIAGNOSIS — E43 Unspecified severe protein-calorie malnutrition: Secondary | ICD-10-CM | POA: Diagnosis present

## 2016-05-14 DIAGNOSIS — E8779 Other fluid overload: Secondary | ICD-10-CM | POA: Diagnosis not present

## 2016-05-14 DIAGNOSIS — Z85828 Personal history of other malignant neoplasm of skin: Secondary | ICD-10-CM | POA: Diagnosis not present

## 2016-05-14 DIAGNOSIS — I517 Cardiomegaly: Secondary | ICD-10-CM

## 2016-05-14 DIAGNOSIS — D689 Coagulation defect, unspecified: Secondary | ICD-10-CM | POA: Diagnosis present

## 2016-05-14 DIAGNOSIS — R091 Pleurisy: Secondary | ICD-10-CM | POA: Diagnosis not present

## 2016-05-14 DIAGNOSIS — I482 Chronic atrial fibrillation: Secondary | ICD-10-CM | POA: Diagnosis present

## 2016-05-14 DIAGNOSIS — E861 Hypovolemia: Secondary | ICD-10-CM | POA: Diagnosis present

## 2016-05-14 DIAGNOSIS — R0602 Shortness of breath: Secondary | ICD-10-CM | POA: Diagnosis not present

## 2016-05-14 DIAGNOSIS — J9811 Atelectasis: Secondary | ICD-10-CM | POA: Diagnosis present

## 2016-05-14 DIAGNOSIS — I248 Other forms of acute ischemic heart disease: Secondary | ICD-10-CM | POA: Diagnosis present

## 2016-05-14 DIAGNOSIS — J189 Pneumonia, unspecified organism: Secondary | ICD-10-CM | POA: Diagnosis present

## 2016-05-14 DIAGNOSIS — R599 Enlarged lymph nodes, unspecified: Secondary | ICD-10-CM | POA: Diagnosis not present

## 2016-05-14 DIAGNOSIS — R918 Other nonspecific abnormal finding of lung field: Secondary | ICD-10-CM | POA: Diagnosis not present

## 2016-05-14 DIAGNOSIS — Z9889 Other specified postprocedural states: Secondary | ICD-10-CM | POA: Diagnosis not present

## 2016-05-14 DIAGNOSIS — R222 Localized swelling, mass and lump, trunk: Secondary | ICD-10-CM | POA: Diagnosis not present

## 2016-05-14 DIAGNOSIS — I4891 Unspecified atrial fibrillation: Secondary | ICD-10-CM

## 2016-05-14 DIAGNOSIS — J939 Pneumothorax, unspecified: Secondary | ICD-10-CM | POA: Diagnosis not present

## 2016-05-14 DIAGNOSIS — E871 Hypo-osmolality and hyponatremia: Secondary | ICD-10-CM

## 2016-05-14 DIAGNOSIS — D6489 Other specified anemias: Secondary | ICD-10-CM | POA: Diagnosis present

## 2016-05-14 DIAGNOSIS — R600 Localized edema: Secondary | ICD-10-CM | POA: Diagnosis not present

## 2016-05-14 DIAGNOSIS — J869 Pyothorax without fistula: Secondary | ICD-10-CM | POA: Diagnosis not present

## 2016-05-14 DIAGNOSIS — I509 Heart failure, unspecified: Secondary | ICD-10-CM | POA: Diagnosis not present

## 2016-05-14 DIAGNOSIS — J811 Chronic pulmonary edema: Secondary | ICD-10-CM | POA: Diagnosis not present

## 2016-05-14 DIAGNOSIS — J410 Simple chronic bronchitis: Secondary | ICD-10-CM

## 2016-05-14 DIAGNOSIS — I481 Persistent atrial fibrillation: Secondary | ICD-10-CM | POA: Diagnosis not present

## 2016-05-14 DIAGNOSIS — I480 Paroxysmal atrial fibrillation: Secondary | ICD-10-CM | POA: Diagnosis not present

## 2016-05-14 DIAGNOSIS — J432 Centrilobular emphysema: Secondary | ICD-10-CM | POA: Diagnosis present

## 2016-05-14 DIAGNOSIS — F17210 Nicotine dependence, cigarettes, uncomplicated: Secondary | ICD-10-CM | POA: Diagnosis present

## 2016-05-14 DIAGNOSIS — J181 Lobar pneumonia, unspecified organism: Secondary | ICD-10-CM | POA: Diagnosis not present

## 2016-05-14 DIAGNOSIS — J9601 Acute respiratory failure with hypoxia: Secondary | ICD-10-CM | POA: Diagnosis not present

## 2016-05-14 DIAGNOSIS — I5031 Acute diastolic (congestive) heart failure: Secondary | ICD-10-CM | POA: Diagnosis not present

## 2016-05-14 DIAGNOSIS — Z79899 Other long term (current) drug therapy: Secondary | ICD-10-CM | POA: Diagnosis not present

## 2016-05-14 DIAGNOSIS — N400 Enlarged prostate without lower urinary tract symptoms: Secondary | ICD-10-CM | POA: Diagnosis present

## 2016-05-14 DIAGNOSIS — E2740 Unspecified adrenocortical insufficiency: Secondary | ICD-10-CM | POA: Diagnosis present

## 2016-05-14 DIAGNOSIS — A419 Sepsis, unspecified organism: Secondary | ICD-10-CM | POA: Diagnosis not present

## 2016-05-14 DIAGNOSIS — J449 Chronic obstructive pulmonary disease, unspecified: Secondary | ICD-10-CM | POA: Diagnosis present

## 2016-05-14 DIAGNOSIS — Z6825 Body mass index (BMI) 25.0-25.9, adult: Secondary | ICD-10-CM | POA: Diagnosis not present

## 2016-05-14 DIAGNOSIS — J90 Pleural effusion, not elsewhere classified: Secondary | ICD-10-CM | POA: Diagnosis present

## 2016-05-14 DIAGNOSIS — Z4682 Encounter for fitting and adjustment of non-vascular catheter: Secondary | ICD-10-CM | POA: Diagnosis not present

## 2016-05-14 DIAGNOSIS — R6521 Severe sepsis with septic shock: Secondary | ICD-10-CM | POA: Diagnosis not present

## 2016-05-14 DIAGNOSIS — Z7982 Long term (current) use of aspirin: Secondary | ICD-10-CM | POA: Diagnosis not present

## 2016-05-14 DIAGNOSIS — Z720 Tobacco use: Secondary | ICD-10-CM | POA: Diagnosis present

## 2016-05-14 DIAGNOSIS — R846 Abnormal cytological findings in specimens from respiratory organs and thorax: Secondary | ICD-10-CM | POA: Diagnosis not present

## 2016-05-14 DIAGNOSIS — R0600 Dyspnea, unspecified: Secondary | ICD-10-CM | POA: Diagnosis not present

## 2016-05-14 DIAGNOSIS — I2729 Other secondary pulmonary hypertension: Secondary | ICD-10-CM | POA: Diagnosis present

## 2016-05-14 LAB — COMPREHENSIVE METABOLIC PANEL
ALK PHOS: 219 U/L — AB (ref 38–126)
ALT: 30 U/L (ref 17–63)
AST: 33 U/L (ref 15–41)
Albumin: 2.2 g/dL — ABNORMAL LOW (ref 3.5–5.0)
Anion gap: 9 (ref 5–15)
BILIRUBIN TOTAL: 1.3 mg/dL — AB (ref 0.3–1.2)
BUN: 39 mg/dL — ABNORMAL HIGH (ref 6–20)
CALCIUM: 8.1 mg/dL — AB (ref 8.9–10.3)
CO2: 23 mmol/L (ref 22–32)
CREATININE: 1.08 mg/dL (ref 0.61–1.24)
Chloride: 99 mmol/L — ABNORMAL LOW (ref 101–111)
GFR calc non Af Amer: >60 mL/min (ref >60)
Glucose, Bld: 105 mg/dL — ABNORMAL HIGH (ref 65–99)
Potassium: 4.3 mmol/L (ref 3.5–5.1)
Sodium: 131 mmol/L — ABNORMAL LOW (ref 135–145)
TOTAL PROTEIN: 5.8 g/dL — AB (ref 6.5–8.1)

## 2016-05-14 LAB — HEPATIC FUNCTION PANEL
ALT: 30 U/L (ref 17–63)
AST: 38 U/L (ref 15–41)
Albumin: 2.1 g/dL — ABNORMAL LOW (ref 3.5–5.0)
Alkaline Phosphatase: 218 U/L — ABNORMAL HIGH (ref 38–126)
Bilirubin, Direct: 0.4 mg/dL (ref 0.1–0.5)
Indirect Bilirubin: 0.6 mg/dL (ref 0.3–0.9)
TOTAL PROTEIN: 5.7 g/dL — AB (ref 6.5–8.1)
Total Bilirubin: 1 mg/dL (ref 0.3–1.2)

## 2016-05-14 LAB — URINALYSIS, ROUTINE W REFLEX MICROSCOPIC
BILIRUBIN URINE: NEGATIVE
GLUCOSE, UA: NEGATIVE mg/dL
Hgb urine dipstick: NEGATIVE
KETONES UR: NEGATIVE mg/dL
LEUKOCYTES UA: NEGATIVE
Nitrite: NEGATIVE
PH: 5 (ref 5.0–8.0)
Protein, ur: 30 mg/dL — AB
Specific Gravity, Urine: 1.011 (ref 1.005–1.030)

## 2016-05-14 LAB — BODY FLUID CELL COUNT WITH DIFFERENTIAL
Eos, Fluid: 0 %
LYMPHS FL: 21 %
MONOCYTE-MACROPHAGE-SEROUS FLUID: 19 % — AB (ref 50–90)
Neutrophil Count, Fluid: 60 % — ABNORMAL HIGH (ref 0–25)
WBC FLUID: 7920 uL — AB (ref 0–1000)

## 2016-05-14 LAB — CBC
HCT: 35.9 % — ABNORMAL LOW (ref 39.0–52.0)
Hemoglobin: 12.2 g/dL — ABNORMAL LOW (ref 13.0–17.0)
MCH: 30 pg (ref 26.0–34.0)
MCHC: 34 g/dL (ref 30.0–36.0)
MCV: 88.4 fL (ref 78.0–100.0)
PLATELETS: 332 10*3/uL (ref 150–400)
RBC: 4.06 MIL/uL — AB (ref 4.22–5.81)
RDW: 15.3 % (ref 11.5–15.5)
WBC: 16.8 10*3/uL — AB (ref 4.0–10.5)

## 2016-05-14 LAB — MAGNESIUM
MAGNESIUM: 1.8 mg/dL (ref 1.7–2.4)
MAGNESIUM: 1.9 mg/dL (ref 1.7–2.4)

## 2016-05-14 LAB — LACTATE DEHYDROGENASE, PLEURAL OR PERITONEAL FLUID: LD, Fluid: 1044 U/L — ABNORMAL HIGH (ref 3–23)

## 2016-05-14 LAB — BRAIN NATRIURETIC PEPTIDE
B NATRIURETIC PEPTIDE 5: 313.7 pg/mL — AB (ref 0.0–100.0)
B NATRIURETIC PEPTIDE 5: 270.7 pg/mL — AB (ref 0.0–100.0)

## 2016-05-14 LAB — PHOSPHORUS: Phosphorus: 4 mg/dL (ref 2.5–4.6)

## 2016-05-14 LAB — PROTEIN, TOTAL: Total Protein: 6.4 g/dL — ABNORMAL LOW (ref 6.5–8.1)

## 2016-05-14 LAB — TROPONIN I
Troponin I: 0.05 ng/mL (ref <0.03)
Troponin I: 0.05 ng/mL (ref <0.03)
Troponin I: 0.06 ng/mL (ref <0.03)

## 2016-05-14 LAB — ECHOCARDIOGRAM COMPLETE
HEIGHTINCHES: 71 in
Weight: 2924.18 oz

## 2016-05-14 LAB — PROCALCITONIN: Procalcitonin: 0.52 ng/mL

## 2016-05-14 LAB — TSH
TSH: 1.53 u[IU]/mL (ref 0.350–4.500)
TSH: 1.187 u[IU]/mL (ref 0.350–4.500)

## 2016-05-14 LAB — SODIUM, URINE, RANDOM: Sodium, Ur: 21 mmol/L

## 2016-05-14 LAB — LACTIC ACID, PLASMA: Lactic Acid, Venous: 1.2 mmol/L (ref 0.5–1.9)

## 2016-05-14 LAB — PREALBUMIN: PREALBUMIN: 7.3 mg/dL — AB (ref 18–38)

## 2016-05-14 LAB — GLUCOSE, SEROUS FLUID: Glucose, Fluid: 33 mg/dL

## 2016-05-14 LAB — CREATININE, URINE, RANDOM: CREATININE, URINE: 69.25 mg/dL

## 2016-05-14 LAB — OSMOLALITY, URINE: OSMOLALITY UR: 394 mosm/kg (ref 300–900)

## 2016-05-14 LAB — PROTEIN, BODY FLUID: Total protein, fluid: <3 g/dL

## 2016-05-14 LAB — STREP PNEUMONIAE URINARY ANTIGEN: Strep Pneumo Urinary Antigen: NEGATIVE

## 2016-05-14 LAB — LACTATE DEHYDROGENASE: LDH: 144 U/L (ref 98–192)

## 2016-05-14 LAB — ALBUMIN, FLUID (OTHER): Albumin, Fluid: <1 g/dL

## 2016-05-14 MED ORDER — ONDANSETRON HCL 4 MG PO TABS: 4.0000 mg | ORAL_TABLET | Freq: Four times a day (QID) | ORAL | Status: DC | PRN

## 2016-05-14 MED ORDER — GUAIFENESIN ER 600 MG PO TB12
600.0000 mg | ORAL_TABLET | Freq: Two times a day (BID) | ORAL | Status: DC
  Administered 2016-05-14 – 2016-05-22 (×17): 600 mg via ORAL
  Filled 2016-05-14 (×18): qty 1

## 2016-05-14 MED ORDER — SENNA 8.6 MG PO TABS
1.0000 | ORAL_TABLET | Freq: Two times a day (BID) | ORAL | Status: DC
  Administered 2016-05-14 – 2016-05-17 (×4): 8.6 mg via ORAL
  Filled 2016-05-14 (×7): qty 1

## 2016-05-14 MED ORDER — IPRATROPIUM BROMIDE 0.02 % IN SOLN
0.5000 mg | Freq: Three times a day (TID) | RESPIRATORY_TRACT | Status: DC
  Administered 2016-05-15 – 2016-05-21 (×17): 0.5 mg via RESPIRATORY_TRACT
  Filled 2016-05-14 (×17): qty 2.5

## 2016-05-14 MED ORDER — DEXTROSE 5 % IV SOLN
1.0000 g | Freq: Once | INTRAVENOUS | Status: AC
  Administered 2016-05-14: 1 g via INTRAVENOUS
  Filled 2016-05-14: qty 1

## 2016-05-14 MED ORDER — IOPAMIDOL (ISOVUE-370) INJECTION 76%
INTRAVENOUS | Status: AC
  Administered 2016-05-15: 08:00:00
  Filled 2016-05-14: qty 100

## 2016-05-14 MED ORDER — CEFTRIAXONE SODIUM 1 G IJ SOLR
1.0000 g | INTRAMUSCULAR | Status: DC
  Administered 2016-05-14 – 2016-05-19 (×6): 1 g via INTRAVENOUS
  Filled 2016-05-14 (×8): qty 10

## 2016-05-14 MED ORDER — LEVALBUTEROL HCL 1.25 MG/0.5ML IN NEBU
1.2500 mg | INHALATION_SOLUTION | Freq: Three times a day (TID) | RESPIRATORY_TRACT | Status: DC
  Administered 2016-05-15 – 2016-05-21 (×16): 1.25 mg via RESPIRATORY_TRACT
  Filled 2016-05-14 (×18): qty 0.5

## 2016-05-14 MED ORDER — ACETAMINOPHEN 325 MG PO TABS
650.0000 mg | ORAL_TABLET | Freq: Four times a day (QID) | ORAL | Status: DC | PRN
  Administered 2016-05-14 – 2016-05-16 (×2): 650 mg via ORAL
  Filled 2016-05-14 (×2): qty 2

## 2016-05-14 MED ORDER — VITAMINS A & D EX OINT
TOPICAL_OINTMENT | CUTANEOUS | Status: AC
  Administered 2016-05-14: 05:00:00
  Filled 2016-05-14: qty 5

## 2016-05-14 MED ORDER — SODIUM CHLORIDE 0.9 % IV SOLN: 250.0000 mL | INTRAVENOUS | Status: DC | PRN

## 2016-05-14 MED ORDER — SODIUM CHLORIDE 0.9% FLUSH: 3.0000 mL | INTRAVENOUS | Status: DC | PRN

## 2016-05-14 MED ORDER — SODIUM CHLORIDE 0.9% FLUSH
3.0000 mL | Freq: Two times a day (BID) | INTRAVENOUS | Status: DC
  Administered 2016-05-14 – 2016-05-19 (×7): 3 mL via INTRAVENOUS

## 2016-05-14 MED ORDER — DEXTROSE 5 % IV SOLN
500.0000 mg | INTRAVENOUS | Status: DC
  Administered 2016-05-14 – 2016-05-15 (×2): 500 mg via INTRAVENOUS
  Filled 2016-05-14 (×2): qty 500

## 2016-05-14 MED ORDER — ENOXAPARIN SODIUM 30 MG/0.3ML ~~LOC~~ SOLN: 30.0000 mg | SUBCUTANEOUS | Status: DC

## 2016-05-14 MED ORDER — SODIUM CHLORIDE 0.9 % IV SOLN
1500.0000 mg | Freq: Once | INTRAVENOUS | Status: AC
  Administered 2016-05-14: 1500 mg via INTRAVENOUS
  Filled 2016-05-14: qty 1500

## 2016-05-14 MED ORDER — ACETAMINOPHEN 650 MG RE SUPP: 650.0000 mg | Freq: Four times a day (QID) | RECTAL | Status: DC | PRN

## 2016-05-14 MED ORDER — FUROSEMIDE 10 MG/ML IJ SOLN
40.0000 mg | Freq: Two times a day (BID) | INTRAMUSCULAR | Status: DC
  Administered 2016-05-14 – 2016-05-15 (×3): 40 mg via INTRAVENOUS
  Filled 2016-05-14 (×3): qty 4

## 2016-05-14 MED ORDER — IOPAMIDOL (ISOVUE-370) INJECTION 76%
100.0000 mL | Freq: Once | INTRAVENOUS | Status: AC | PRN
  Administered 2016-05-14: 100 mL via INTRAVENOUS

## 2016-05-14 MED ORDER — SODIUM CHLORIDE 0.9% FLUSH
3.0000 mL | Freq: Two times a day (BID) | INTRAVENOUS | Status: DC
  Administered 2016-05-15 – 2016-05-18 (×5): 3 mL via INTRAVENOUS

## 2016-05-14 MED ORDER — POLYETHYLENE GLYCOL 3350 17 G PO PACK
17.0000 g | PACK | Freq: Every day | ORAL | Status: DC | PRN
  Administered 2016-05-19: 17 g via ORAL
  Filled 2016-05-14: qty 1

## 2016-05-14 MED ORDER — NICOTINE 21 MG/24HR TD PT24
21.0000 mg | MEDICATED_PATCH | Freq: Every day | TRANSDERMAL | Status: DC
  Administered 2016-05-14 – 2016-05-27 (×12): 21 mg via TRANSDERMAL
  Filled 2016-05-14 (×13): qty 1

## 2016-05-14 MED ORDER — ENSURE ENLIVE PO LIQD
237.0000 mL | Freq: Three times a day (TID) | ORAL | Status: DC
  Administered 2016-05-14 – 2016-05-31 (×36): 237 mL via ORAL
  Filled 2016-05-14: qty 237

## 2016-05-14 MED ORDER — LEVALBUTEROL HCL 0.63 MG/3ML IN NEBU
0.6300 mg | INHALATION_SOLUTION | RESPIRATORY_TRACT | Status: DC | PRN
  Administered 2016-05-15 – 2016-05-21 (×2): 0.63 mg via RESPIRATORY_TRACT
  Filled 2016-05-14 (×3): qty 3

## 2016-05-14 MED ORDER — ASPIRIN 81 MG PO CHEW
81.0000 mg | CHEWABLE_TABLET | Freq: Every day | ORAL | Status: DC
  Administered 2016-05-14 – 2016-05-19 (×6): 81 mg via ORAL
  Filled 2016-05-14 (×6): qty 1

## 2016-05-14 MED ORDER — HYDROCODONE-ACETAMINOPHEN 5-325 MG PO TABS
1.0000 | ORAL_TABLET | ORAL | Status: DC | PRN
  Administered 2016-05-14: 2 via ORAL
  Administered 2016-05-15: 1 via ORAL
  Administered 2016-05-15 – 2016-05-17 (×7): 2 via ORAL
  Filled 2016-05-14 (×6): qty 2
  Filled 2016-05-14: qty 1
  Filled 2016-05-14 (×3): qty 2

## 2016-05-14 MED ORDER — IPRATROPIUM BROMIDE 0.02 % IN SOLN
0.5000 mg | Freq: Four times a day (QID) | RESPIRATORY_TRACT | Status: DC
  Administered 2016-05-14 (×2): 0.5 mg via RESPIRATORY_TRACT
  Filled 2016-05-14 (×2): qty 2.5

## 2016-05-14 MED ORDER — ONDANSETRON HCL 4 MG/2ML IJ SOLN
4.0000 mg | Freq: Four times a day (QID) | INTRAMUSCULAR | Status: DC | PRN
  Filled 2016-05-14: qty 2

## 2016-05-14 MED ORDER — ENOXAPARIN SODIUM 40 MG/0.4ML ~~LOC~~ SOLN
40.0000 mg | Freq: Every day | SUBCUTANEOUS | Status: DC
  Administered 2016-05-14 – 2016-05-15 (×2): 40 mg via SUBCUTANEOUS
  Filled 2016-05-14 (×2): qty 0.4

## 2016-05-14 MED ORDER — ENSURE ENLIVE PO LIQD
237.0000 mL | Freq: Two times a day (BID) | ORAL | Status: DC
  Administered 2016-05-14: 237 mL via ORAL

## 2016-05-14 NOTE — H&P (Signed)
[name] BUL:[id number] DOB: 03/07/1946 DOA: 05/13/2016     PCP: [place]   Outpatient Specialists: Urology Lacinda Axon  Patient coming from:   home Lives   With family   Chief Complaint: Shortness of breath  HPI: [name] is a 71 y.o. male with medical history significant of COPD, prostate cancer, inguinal hernia status post repair,     Presented with  2 week history of progressive shortness of breath. Patient presented to walk-in clinic 2 days ago was diagnosed with pneumonia and prescribed Levaquin 750 daily he took 2 doses and notice worsening lower extremity swelling. He reports leg edema since 2016. He has had some worsening fatigue. He reports he has chronic leg edema but has gotten worse progressively over past 24 hours. Patient endorses cough he has chronic orthopnea but has been getting worse. No fevers or chills no sputum production. Reports no improvement with Levaquin. Denies palpitation. No steroid use.   He had some chills few days ago, His flu swab was negative few days ago. He reports sharp right shoulder blade pain few days ago now resolved after putting on hot bottle.   Regarding pertinent Chronic problems: History of prostate cancer followed by urology History of COPD continues to smoke. Reports his liver enzymes were elevated last month and he had an US done at [facility] but no one gave him any results.   IN ER:  Temp (24hrs), Avg:97.7 F (36.5 C), Min:97.7 F (36.5 C), Max:97.7 F (36.5 C)    RR 19 setting 94% HR 100 BP 112/69  Troponin 0.04 WBC 20.2 hemoglobin 13.8 Sodium 1:30 BUN 40 creatinine 1.29 which is up from baseline of 0.73 TSH 1.530 magnesium 1.8  Chest x-ray show moderate right and small left pleural effusions patchy airspace opacities concerning for pneumonia versus pulmonary edema  Following Medications were ordered in ER: Medications  vancomycin (VANCOCIN) 1,500 mg in sodium chloride 0.9 % 500 mL IVPB (not administered)  ceFEPIme  (MAXIPIME) 1 g in dextrose 5 % 50 mL IVPB (not administered)      Hospitalist was called for admission for Bilateral pleural effusions possible pneumonia versus CHF  Review of Systems:    Pertinent positives include:  shortness of breath at rest.   dyspnea on exertion, Bilateral lower extremity swelling    Constitutional:  No weight loss, night sweats, Fevers, chills, fatigue, weight loss  HEENT:  No headaches, Difficulty swallowing,Tooth/dental problems,Sore throat,  No sneezing, itching, ear ache, nasal congestion, post nasal drip,  Cardio-vascular:  No chest pain, Orthopnea, PND, anasarca, dizziness, palpitations.no GI:  No heartburn, indigestion, abdominal pain, nausea, vomiting, diarrhea, change in bowel habits, loss of appetite, melena, blood in stool, hematemesis Resp:  no No excess mucus, no productive cough, No non-productive cough, No coughing up of blood.No change in color of mucus.No wheezing. Skin:  no rash or lesions. No jaundice GU:  no dysuria, change in color of urine, no urgency or frequency. No straining to urinate.  No flank pain.  Musculoskeletal:  No joint pain or no joint swelling. No decreased range of motion. No back pain.  Psych:  No change in mood or affect. No depression or anxiety. No memory loss.  Neuro: no localizing neurological complaints, no tingling, no weakness, no double vision, no gait abnormality, no slurred speech, no confusion  As per HPI otherwise 10 point review of systems negative.   Past Medical History: Past Medical History:  Diagnosis Date  . Arthritis    "neck" (08/15/2015)  .  Chronic bronchitis ([place])   . Constipation   . COPD (chronic obstructive pulmonary disease) ([place])   . DDD (degenerative disc disease), cervical   . Edema of left lower extremity   . Facial basal cell cancer   . H/O acne vulgaris 1960s   "led to my discharge from the [facility] in the mid 1960s"  . Headache    history of - left temporal- years ago- not current  (08/15/2015)  . Pneumonia 1960s; 2015 X 2  . Prostate cancer ([place]) dx'd early 2000s   "low spreading; non aggressive type" (08/15/2015)  . Skin cancer    "back"   Past Surgical History:  Procedure Laterality Date  . COLONOSCOPY    . EXCISIONAL HEMORRHOIDECTOMY  1960s  . INGUINAL HERNIA REPAIR Right 2002  . INGUINAL HERNIA REPAIR Bilateral 08/15/2015  . INGUINAL HERNIA REPAIR Bilateral 08/15/2015   Procedure: OPEN REPAIR RECURRENT RIGHT INGUINAL HERNIA WITH MESH AND REPAIR LEFT INGUINAL HERNIA WITH MESH;  Surgeon: [name], MD;  Location: [facility];  Service: General;  Laterality: Bilateral;  . INSERTION OF MESH Bilateral 08/15/2015   Procedure: INSERTION OF MESH;  Surgeon: [name], MD;  Location: [facility];  Service: General;  Laterality: Bilateral;  . PROSTATE BIOPSY    . TONSILLECTOMY  1950s     Social History:  Ambulatory   Independently     reports that he has been smoking Cigarettes.  He has a 62.00 pack-year smoking history. He has never used smokeless tobacco. He reports that he does not drink alcohol or use drugs.  Allergies:   Allergies  Allergen Reactions  . Demerol [Meperidine] Other (See Comments)    Causes system to shutdown. ?        Family History:  Family History  Problem Relation Age of Onset  . Alcoholism Father   . CAD Father   . Ovarian cancer Sister   . Diabetes Neg Hx     Medications: Prior to Admission medications   Medication Sig Start Date End Date Taking? Authorizing Provider  Ascorbic Acid (VITAMIN C) 1000 MG tablet Take 1,000 mg by mouth 2 (two) times daily.   Yes Historical Provider, MD  aspirin 81 MG tablet Take 81 mg by mouth daily. Reported on 05/08/2015   Yes Historical Provider, MD  fluocinonide-emollient (LIDEX-E) 0.05 % cream Apply 1 application topically daily as needed (for skin).  01/06/14  Yes Historical Provider, MD  ibuprofen (ADVIL,MOTRIN) 200 MG tablet Take 800 mg by mouth 2 (two) times daily as needed for moderate pain (for  arthritiis).   Yes Historical Provider, MD  levofloxacin (LEVAQUIN) 750 MG tablet Take 750 mg by mouth daily. x5 days 05/11/16  Yes Historical Provider, MD  oxyCODONE-acetaminophen (PERCOCET/ROXICET) 5-325 MG tablet Take 1-2 tablets by mouth every 4 (four) hours as needed for moderate pain. Patient not taking: Reported on 05/13/2016 08/16/15   [name], MD  tamsulosin [facility]) 0.4 MG CAPS capsule Take 1 capsule (0.4 mg total) by mouth daily after breakfast. Patient not taking: Reported on 05/13/2016 08/16/15   [name], MD    Physical Exam: Patient Vitals for the past 24 hrs:  BP Temp Temp src Pulse Resp SpO2 Height Weight  05/13/16 2234 - - - 100 19 94 % - -  05/13/16 2106 112/69 - - 91 18 91 % - -  05/13/16 1843 - - - - - - _0  (1.803 m) 81.6 kg (180 lb)  05/13/16 1841 117/71 97.7 F (36.5 C) Oral 99 15 97 % - -  1. General:  in No Acute distress 2. Psychological: Alert and  Oriented 3. Head/ENT:   Moist   Mucous Membranes                          Head Non traumatic, neck supple                          Poor Dentition                          JVD noted 4. SKIN: normal   Skin turgor,  Skin clean Dry and intact no rash 5. Heart: Regular rate and rhythm no Murmur, Rub or gallop 6. Lungs:   no wheezes some crackles  Diminished at the bases  7. Abdomen: Soft,  non-tender, Non distended 8. Lower extremities: no clubbing, cyanosis, 2+ edema 9. Neurologically Grossly intact, moving all 4 extremities equally   10. MSK: Normal range of motion   body mass index is 25.1 kg/m.  Labs on Admission:   Labs on Admission: I have personally reviewed following labs and imaging studies  CBC:  Recent Labs Lab 05/13/16 2259  WBC 20.2*  NEUTROABS 17.0*  HGB 13.8  HCT 39.4  MCV 87.4  PLT 793   Basic Metabolic Panel:  Recent Labs Lab 05/13/16 2259  NA 130*  K 4.4  CL 98*  CO2 24  GLUCOSE 113*  BUN 40*  CREATININE 1.29*  CALCIUM 8.5*  MG 1.8   GFR: Estimated  Creatinine Clearance: 55.1 mL/min (by C-G formula based on SCr of 1.29 mg/dL (H)). Liver Function Tests: No results for input(s): AST, ALT, ALKPHOS, BILITOT, PROT, ALBUMIN in the last 168 hours. No results for input(s): LIPASE, AMYLASE in the last 168 hours. No results for input(s): AMMONIA in the last 168 hours. Coagulation Profile: No results for input(s): INR, PROTIME in the last 168 hours. Cardiac Enzymes: No results for input(s): CKTOTAL, CKMB, CKMBINDEX, TROPONINI in the last 168 hours. BNP (last 3 results) No results for input(s): PROBNP in the last 8760 hours. HbA1C: No results for input(s): HGBA1C in the last 72 hours. CBG: No results for input(s): GLUCAP in the last 168 hours. Lipid Profile: No results for input(s): CHOL, HDL, LDLCALC, TRIG, CHOLHDL, LDLDIRECT in the last 72 hours. Thyroid Function Tests:  Recent Labs  05/13/16 2259  TSH 1.530   Anemia Panel: No results for input(s): VITAMINB12, FOLATE, FERRITIN, TIBC, IRON, RETICCTPCT in the last 72 hours. Urine analysis: No results found for: COLORURINE, APPEARANCEUR, LABSPEC, PHURINE, GLUCOSEU, HGBUR, BILIRUBINUR, KETONESUR, PROTEINUR, UROBILINOGEN, NITRITE, LEUKOCYTESUR Sepsis Labs: _0 (procalcitonin:4,lacticidven:4) )No results found for this or any previous visit (from the past 240 hour(s)).     UA ordered  No results found for: HGBA1C  Estimated Creatinine Clearance: 55.1 mL/min (by C-G formula based on SCr of 1.29 mg/dL (H)).  BNP (last 3 results) No results for input(s): PROBNP in the last 8760 hours.   ECG REPORT  Independently reviewed Rate: 96  Rhythm: A. fib ST&T Change: Lachman's and inverted T waves QTC  454  Filed Weights   05/13/16 1843  Weight: 81.6 kg (180 lb)     Cultures: No results found for: SDES, SPECREQUEST, CULT, REPTSTATUS   Radiological Exams on Admission: Dg Chest 2 View  Result Date: 05/13/2016 CLINICAL DATA:  On antibiotics for pneumonia. Increasing lower  extremity swelling, shortness of breath, congestion and chest heaviness. History of  COPD, prostate cancer. EXAM: CHEST  2 VIEW COMPARISON:  Chest radiograph August 08, 2015 FINDINGS: Moderate RIGHT and small LEFT layering pleural effusions. Diffuse coarsened pulmonary interstitium, with superimposed patchy airspace opacities. Radiopaque local granuloma. Calcified mediastinal lymph nodes. Cardiac silhouette partially obscured, overall normal in size. Mildly calcified aortic knob. No pneumothorax. Soft tissue planes and included osseous structure nonsuspicious. Lower thoracic dextroscoliosis. IMPRESSION: Moderate RIGHT and small LEFT pleural effusions, patchy airspace opacities concerning for pneumonia, less likely pulmonary edema. COPD. Electronically Signed   By: Elon Alas M.D.   On: 05/13/2016 22:37    Chart has been reviewed    Assessment/Plan  71 y.o. male with medical history significant of COPD, prostate cancer, inguinal hernia status post repair admitted for dyspnea found to have pleural effusions possible new infiltrates evidence of new atrial fibrillation fluid overload  Present on Admission:   . A-fib ([place]) new diagnosis  - Admit to telemetry heart rate currently at 100 given soft blood pressures will hold off on initiation of beta blocker or Cardizem for now and await results of echo       CHA2D-VASC score  1 that suspect may go up if there is evidence of heart failure      Would often anticoagulation for now may need to do thoracentesis in case no clear etiology for pleural effusion         Check TSH      Cycle cardiac enzymes      Obtain ECHO      Cardiology consult in AM  . Pleural effusion - likely secondary to fluid overload. Evaluate for CHF. Patient denies any fevers but does have leukocytosis will obtain CT to evaluate for possible underlying infiltrate versus malignancy. If does not improve with diuresis or if evidence of parapneumonic effusion may need thoracentesis .  COPD (chronic obstructive pulmonary disease) (HCC) - when necessary Xopenex and scheduled Atrovent Recommend tobacco cessation Fluid overload evaluate for CHF pain echogram attempt to diuresis and monitor renal function Tobacco abuse patient interested in quitting order nicotine patch Estrella of elevated LFTs we will recheck Hyponatremia likely secondary to fluid overload. We'll order urine electrolytes Leukocytosis will evaluate for any source of infection likely be pneumonia Community-acquired pneumonia chest x-ray worrisome for infiltrates will cover with Rocephin and azithromycin Other plan as per orders.  DVT prophylaxis:    Lovenox     Code Status:  FULL CODE   as per patient    Family Communication:   Family   at  Bedside  plan of care was discussed with   Wife,   Disposition Plan:     To home once workup is complete and patient is stable      Consults called: emailed cardiology     Admission status:  Inpatient    Level of care     tele             I have spent a total of 56 min on this admission   Keven Osborn 05/14/2016, 1:15 AM    [facility]  Pager [phone]   after 2 AM please page floor coverage PA If 7AM-7PM, please contact the day team taking care of the patient  [email or web address]  Password [email or web address]

## 2016-05-14 NOTE — Progress Notes (Signed)
  Echocardiogram 2D Echocardiogram has been performed.  [name] 05/14/2016, 2:27 PM

## 2016-05-14 NOTE — Consult Note (Signed)
PULMONARY / CRITICAL CARE MEDICINE   Name: [name] MRN: [id number] DOB: 1946-05-03    ADMISSION DATE:  05/13/2016 CONSULTATION DATE:  05/15/2015  REFERRING MD:  [place] Pink  CHIEF COMPLAINT:  Dyspnea  HISTORY OF PRESENT ILLNESS:   71 y/o smoker admitted from the [facility] emergency department on 05/13/2016 with a chief complaint of shortness of breath. He states that he has a known diagnosis of COPD and in the last year he's been experiencing increasing shortness of breath and about a 6-10 pound weight loss. He's also noted increasing ankle swelling. Approximately 4 days prior to admission he had worsening shortness of breath associated with some right-sided chest pain and left leg pain and swelling. He said that the swelling progressed over the ensuing 2-3 days. During this time he was seen by an urgent care facility and was treated with Levaquin for possible pneumonia. He had a dry cough and no productive cough. He noted some chills but no fever. The Levaquin did not seem to be helping and his symptoms worsened including swelling and dyspnea so he came to the emergency department on 05/13/2016 for further evaluation. Here he had a CT scan of the chest which showed no evidence of a pulmonary embolism but he had a mediastinal mass with a large right-sided pleural effusion in addition to centrilobular emphysema. Is also noted to be in A. fib.  PAST MEDICAL HISTORY :  He  has a past medical history of Arthritis; Chronic bronchitis ([place]); Constipation; COPD (chronic obstructive pulmonary disease) (HCC); DDD (degenerative disc disease), cervical; Edema of left lower extremity; Facial basal cell cancer; H/O acne vulgaris (1960s); Headache; Pneumonia (1960s; 2015 X 2); Prostate cancer ([place]) (dx'd early 2000s); and Skin cancer.  PAST SURGICAL HISTORY: He  has a past surgical history that includes Prostate biopsy; Colonoscopy; Inguinal hernia repair (Right, 2002); Inguinal hernia repair (Bilateral,  08/15/2015); Tonsillectomy (1950s); Excisional hemorrhoidectomy (1960s); Inguinal hernia repair (Bilateral, 08/15/2015); and Insertion of mesh (Bilateral, 08/15/2015).  Allergies  Allergen Reactions  . Demerol [Meperidine] Other (See Comments)    Causes system to shutdown. ?     No current facility-administered medications on file prior to encounter.    Current Outpatient Prescriptions on File Prior to Encounter  Medication Sig  . Ascorbic Acid (VITAMIN C) 1000 MG tablet Take 1,000 mg by mouth 2 (two) times daily.  [name] aspirin 81 MG tablet Take 81 mg by mouth daily. Reported on 05/08/2015  . fluocinonide-emollient (LIDEX-E) 0.05 % cream Apply 1 application topically daily as needed (for skin).   [name] ibuprofen (ADVIL,MOTRIN) 200 MG tablet Take 800 mg by mouth 2 (two) times daily as needed for moderate pain (for arthritiis).  [name] oxyCODONE-acetaminophen (PERCOCET/ROXICET) 5-325 MG tablet Take 1-2 tablets by mouth every 4 (four) hours as needed for moderate pain. (Patient not taking: Reported on 05/13/2016)  . tamsulosin (FLOMAX) 0.4 MG CAPS capsule Take 1 capsule (0.4 mg total) by mouth daily after breakfast. (Patient not taking: Reported on 05/13/2016)    FAMILY HISTORY:  His indicated that his mother is deceased. He indicated that his father is deceased. He indicated that his sister is deceased. He indicated that the status of his neg hx is unknown.    SOCIAL HISTORY: He  reports that he has been smoking Cigarettes.  He has a 62.00 pack-year smoking history. He has never used smokeless tobacco. He reports that he does not drink alcohol or use drugs.  REVIEW OF SYSTEMS:   Gen: Denies fever, + chills,  +  weight change, + fatigue, night sweats HEENT: Denies blurred vision, double vision, hearing loss, tinnitus, sinus congestion, rhinorrhea, sore throat, neck stiffness, dysphagia PULM: per HPI CV: Denies chest pain, edema, orthopnea, paroxysmal nocturnal dyspnea, palpitations GI: Denies abdominal pain,  nausea, vomiting, diarrhea, hematochezia, melena, constipation, change in bowel habits GU: Denies dysuria, hematuria, polyuria, oliguria, urethral discharge Endocrine: Denies hot or cold intolerance, polyuria, polyphagia or appetite change Derm: Denies rash, dry skin, scaling or peeling skin change Heme: Denies easy bruising, bleeding, bleeding gums Neuro: Denies headache, numbness, weakness, slurred speech, loss of memory or consciousness   SUBJECTIVE:  As above  VITAL SIGNS: BP 112/67 (BP Location: Right Arm)   Pulse 87   Temp 98.5 F (36.9 C) (Oral)   Resp 18   Ht _0  (1.803 m)   Wt 182 lb 12.2 oz (82.9 kg)   SpO2 99%   BMI 25.49 kg/m   HEMODYNAMICS:    VENTILATOR SETTINGS:    INTAKE / OUTPUT: I/O last 3 completed shifts: In: -  Out: 600 [Urine:600]  PHYSICAL EXAMINATION: General:  Chronically ill appearing, no distress Neuro:  Awake, alert, oriented 4 HEENT:  Normocephalic atraumatic, oropharynx clear Cardiovascular:  Irregularly irregular, no murmurs gallops or rubs, mild elevation of JVD Lungs:  Diminished right base, few crackles right base, no wheezing otherwise, normal effort Abdomen:  Bowel sounds positive, nontender nondistended Musculoskeletal:  Normal bulk and tone Skin:  Pitting edema from ankles to knees bilaterally  LABS:  BMET  Recent Labs Lab 05/13/16 2259 05/14/16 0347  NA 130* 131*  K 4.4 4.3  CL 98* 99*  CO2 24 23  BUN 40* 39*  CREATININE 1.29* 1.08  GLUCOSE 113* 105*    Electrolytes  Recent Labs Lab 05/13/16 2259 05/14/16 0347  CALCIUM 8.5* 8.1*  MG 1.8 1.9  PHOS  --  4.0    CBC  Recent Labs Lab 05/13/16 2259 05/14/16 0347  WBC 20.2* 16.8*  HGB 13.8 12.2*  HCT 39.4 35.9*  PLT 359 332    Coag's No results for input(s): APTT, INR in the last 168 hours.  Sepsis Markers  Recent Labs Lab 05/14/16 0037 05/14/16 0347  LATICACIDVEN 1.2  --   PROCALCITON  --  0.52    ABG No results for input(s): PHART,  PCO2ART, PO2ART in the last 168 hours.  Liver Enzymes  Recent Labs Lab 05/14/16 0037 05/14/16 0347  AST 38 33  ALT 30 30  ALKPHOS 218* 219*  BILITOT 1.0 1.3*  ALBUMIN 2.1* 2.2*    Cardiac Enzymes  Recent Labs Lab 05/14/16 0037 05/14/16 0604 05/14/16 1247  TROPONINI 0.05* 0.05* 0.06*    Glucose No results for input(s): GLUCAP in the last 168 hours.  Imaging Dg Chest 2 View  Result Date: 05/13/2016 CLINICAL DATA:  On antibiotics for pneumonia. Increasing lower extremity swelling, shortness of breath, congestion and chest heaviness. History of COPD, prostate cancer. EXAM: CHEST  2 VIEW COMPARISON:  Chest radiograph August 08, 2015 FINDINGS: Moderate RIGHT and small LEFT layering pleural effusions. Diffuse coarsened pulmonary interstitium, with superimposed patchy airspace opacities. Radiopaque local granuloma. Calcified mediastinal lymph nodes. Cardiac silhouette partially obscured, overall normal in size. Mildly calcified aortic knob. No pneumothorax. Soft tissue planes and included osseous structure nonsuspicious. Lower thoracic dextroscoliosis. IMPRESSION: Moderate RIGHT and small LEFT pleural effusions, patchy airspace opacities concerning for pneumonia, less likely pulmonary edema. COPD. Electronically Signed   By: Elon Alas M.D.   On: 05/13/2016 22:37   Ct Angio Chest Pe W  Or Wo Contrast  Result Date: 05/14/2016 CLINICAL DATA:  Bilateral leg swelling with dyspnea history of pneumonia EXAM: CT ANGIOGRAPHY CHEST WITH CONTRAST TECHNIQUE: Multidetector CT imaging of the chest was performed using the standard protocol during bolus administration of intravenous contrast. Multiplanar CT image reconstructions and MIPs were obtained to evaluate the vascular anatomy. CONTRAST:  100 mL Isovue 370 intravenous COMPARISON:  05/13/2016 FINDINGS: Cardiovascular: Satisfactory opacification of the pulmonary arteries to the segmental level. No evidence of pulmonary embolism. Mild  cardiomegaly. No large pericardial effusion. Atherosclerosis of the aorta. No dissection. Mediastinum/Nodes: Trachea is midline, there is mild debris within the distal trachea just above the carina. Thyroid gland is normal. Multiple enlarged mediastinal lymph nodes. AP window node measures 1.7 cm in short axis. Precarinal lymph node measures 1.5 cm in short axis. Poorly defined density in the sub- carinal region with different attenuation value from large pleural effusion on the right. This measures approximately 6.9 cm transverse by 5.1 cm AP. The esophagus appears deviated to the left by mass lesion. Multiple calcifications within the right hilus and mediastinum, consistent with prior granulomatous disease. Mild axillary adenopathy. Lungs/Pleura: Large right-sided pleural effusion and small left-sided pleural effusion. Marked emphysematous disease bilaterally. Dense consolidation in the right lower lobe may reflect a pneumonia. Upper Abdomen: Nodular fullness of the adrenal glands. No acute abnormality. No suspicious bone lesions. Musculoskeletal: No acute or suspicious bone lesions are visualized. There are degenerative changes of the spine. Review of the MIP images confirms the above findings. IMPRESSION: 1. No CT evidence for acute pulmonary embolus or aortic dissection. 2. Large right-sided pleural effusion and small left-sided pleural effusion. Dense consolidation in the right lower lobe suspicious for a pneumonia. Marked emphysematous disease bilaterally. 3. Poorly defined soft tissue density within the subcarinal region measuring approximately 6.9 cm is suspicious for a mass. It abuts or involves the esophagus which is displaced to the left. 4. Moderate mediastinal adenopathy. Electronically Signed   By: Donavan Foil M.D.   On: 05/14/2016 02:25     STUDIES:  05/13/2016 CT angiogram chest showed large right-sided pleural effusion, centrilobular emphysema, atelectasis versus consolidation of the right  lower lobe and an ill-defined soft tissue density in the subcarinal region worrisome for a mass.  CULTURES: 05/14/2016 blood culture 05/14/2016 sputum culture  ANTIBIOTICS: 05/13/2016 ceftriaxone 05/13/2016 azithromycin  SIGNIFICANT EVENTS:   LINES/TUBES:   DISCUSSION: 71 year old smoker with a past medical history significant for COPD was admitted with acute respiratory failure with mild hypoxemia in the setting of a large right-sided pleural effusion a baseline centrilobular emphysema. My primary concern here is that this is lung cancer with an associated malignant pleural effusion. However, the differential diagnosis includes congestive heart failure given his leg swelling and right-sided pleural effusion. I doubt pneumonia but I think it's reasonable to treat with antibiotics for now.  ASSESSMENT / PLAN:  PULMONARY A: R pleural effusion R lung atelectasis vs consolidation Mediastinal mass P:   Thoracentesis now: sent for routine work up for exudate/transudate, malignancy and infection Repeat CXR post thora Will need bronchoscopy with biopsy if thoracentesis not consistent with malignancy Antibiotics per [facility], consider discontinuing them  Roselie Awkward, MD [place] [facility] Pager: [phone] Cell: [phone] After 3pm or if no response, call [phone]   05/14/2016, 3:00 PM

## 2016-05-14 NOTE — Progress Notes (Signed)
PROGRESS NOTE  [name] FUX:[id number] DOB: [birth year] DOA: 05/13/2016 PCP: [name]  HPI/Recap of past 60 hours: 71 year old male with past medical history of prostate cancer, tobacco use and COPD admitted for one week of worsening dyspnea and found to be in acute respiratory failure secondary to pneumonia, possible CHF and also in atrial fibrillation. Patient noted to have bilateral pleural effusions although right side significantly large. Patient also noted to have a fair-sized mass on CT scan within the subcarinal region. Patient started on IV antibiotics plus supplemental oxygen. Pulmonary consulted for thoracentesis.  Patient himself feeling better after on oxygen. Denies any chest pain.  Assessment/Plan: Active Problems:   Hyponatremia: Mild secondary to dehydration.  Acute respiratory failure with hypoxia secondary to COPD, pneumonia and heart failure: Chronic 2 L nasal cannula to keep oxygen saturation greater than 90%.    New-onset A-fib [facility]): New diagnosis. Rate controlled. Checking echocardiogram. Until heart failure diagnosis confirmed, chads 2 score of 1.    Acute unspecified CHF (congestive heart failure) ([place]): Suspected given mildly elevated BNP although this may also be from atrial fibrillation and respiratory failure from pneumonia. Checking echocardiogram. With Lasix, has already diuresed over 1 L    Pleural effusion: Large on right, small on left. In part from heart failure, but also may be inflammatory. Pulmonary to tap.   COPD (chronic obstructive pulmonary disease) ([place]): On nebulizers. Plus oxygen and antibiotics    CAP (community acquired pneumonia): Elevated pro calcitonin level, have started IV antibiotics   Tobacco abuse: Counseled to quit    Mass in chest: Unclear etiology. Have discussed with pulmonary. Patient does not know yet about this diagnosis. We'll wait until he has had thoracentesis for better evaluation. Will need biopsy.   Protein-calorie malnutrition, severe [facility]): Patient meets criteria in the context of chronic illness. Seen by nutrition. Started on ensure 3 times a day   Code Status: Full code   Family Communication: Briefly met daughter and wife   Disposition Plan: Continue as inpatient. Determination based off of evaluation of mass    Consultants:  Pulmonary   Procedures:  Echocardiogram done 1/2: Results pending  Planned thoracentesis 1/2   Antimicrobials:  IV Rocephin and Zithromax 1/1-present  Prior to admission, patient on Levaquin 12/30-12/31   DVT prophylaxis:  Lovenox   Objective: Vitals:   05/14/16 0150 05/14/16 0205 05/14/16 0245 05/14/16 1245  BP: 112/65 122/62 104/66 112/67  Pulse:  97 89 87  Resp:  _0 Temp:   98.4 F (36.9 C) 98.5 F (36.9 C)  TempSrc:   Oral Oral  SpO2:  97% 99% 99%  Weight:   82.9 kg (182 lb 12.2 oz)   Height:   _1  (1.803 m)     Intake/Output Summary (Last 24 hours) at 05/14/16 1506 Last data filed at 05/14/16 1200  Gross per 24 hour  Intake               50 ml  Output             1200 ml  Net            -1150 ml   Filed Weights   05/13/16 1843 05/14/16 0245  Weight: 81.6 kg (180 lb) 82.9 kg (182 lb 12.2 oz)    Exam:   General:  Alert and oriented 3, no acute distress   Cardiovascular: Irregular rhythm, rate controlled   Respiratory: Decreased breath sounds right lung from mid way down  Abdomen: Soft, nontender, nondistended, positive bowel sounds   Musculoskeletal: Trace pitting edema bilaterally from the knees down   Skin: No skin breaks, tears or lesions  Psychiatry: Patient is appropriate, no evidence of psychoses    Data Reviewed: CBC:  Recent Labs Lab 05/13/16 2259 05/14/16 0347  WBC 20.2* 16.8*  NEUTROABS 17.0*  --   HGB 13.8 12.2*  HCT 39.4 35.9*  MCV 87.4 88.4  PLT 359 371   Basic Metabolic Panel:  Recent Labs Lab 05/13/16 2259 05/14/16 0347  NA 130* 131*  K 4.4 4.3  CL 98* 99*    CO2 24 23  GLUCOSE 113* 105*  BUN 40* 39*  CREATININE 1.29* 1.08  CALCIUM 8.5* 8.1*  MG 1.8 1.9  PHOS  --  4.0   GFR: Estimated Creatinine Clearance: 65.8 mL/min (by C-G formula based on SCr of 1.08 mg/dL). Liver Function Tests:  Recent Labs Lab 05/14/16 0037 05/14/16 0347  AST 38 33  ALT 30 30  ALKPHOS 218* 219*  BILITOT 1.0 1.3*  PROT 5.7* 5.8*  ALBUMIN 2.1* 2.2*   No results for input(s): LIPASE, AMYLASE in the last 168 hours. No results for input(s): AMMONIA in the last 168 hours. Coagulation Profile: No results for input(s): INR, PROTIME in the last 168 hours. Cardiac Enzymes:  Recent Labs Lab 05/14/16 0037 05/14/16 0604 05/14/16 1247  TROPONINI 0.05* 0.05* 0.06*   BNP (last 3 results) No results for input(s): PROBNP in the last 8760 hours. HbA1C: No results for input(s): HGBA1C in the last 72 hours. CBG: No results for input(s): GLUCAP in the last 168 hours. Lipid Profile: No results for input(s): CHOL, HDL, LDLCALC, TRIG, CHOLHDL, LDLDIRECT in the last 72 hours. Thyroid Function Tests:  Recent Labs  05/14/16 0347  TSH 1.187   Anemia Panel: No results for input(s): VITAMINB12, FOLATE, FERRITIN, TIBC, IRON, RETICCTPCT in the last 72 hours. Urine analysis:    Component Value Date/Time   COLORURINE YELLOW 05/14/2016 0032   APPEARANCEUR CLEAR 05/14/2016 0032   LABSPEC 1.011 05/14/2016 0032   PHURINE 5.0 05/14/2016 0032   GLUCOSEU NEGATIVE 05/14/2016 0032   HGBUR NEGATIVE 05/14/2016 0032   BILIRUBINUR NEGATIVE 05/14/2016 0032   KETONESUR NEGATIVE 05/14/2016 0032   PROTEINUR 30 (A) 05/14/2016 0032   NITRITE NEGATIVE 05/14/2016 0032   LEUKOCYTESUR NEGATIVE 05/14/2016 0032   Sepsis Labs: _0 (procalcitonin:4,lacticidven:4)  ) Recent Results (from the past 240 hour(s))  Blood culture (routine x 2)     Status: None (Preliminary result)   Collection Time: 05/14/16 12:02 AM  Result Value Ref Range Status   Specimen Description BLOOD RIGHT  ANTECUBITAL  Final   Special Requests BOTTLES DRAWN AEROBIC AND ANAEROBIC 5ML  Final   Culture   Final    NO GROWTH < 12 HOURS Performed at [facility]    Report Status PENDING  Incomplete  Blood culture (routine x 2)     Status: None (Preliminary result)   Collection Time: 05/14/16 12:02 AM  Result Value Ref Range Status   Specimen Description BLOOD LEFT ANTECUBITAL  Final   Special Requests BOTTLES DRAWN AEROBIC AND ANAEROBIC 5ML  Final   Culture   Final    NO GROWTH < 12 HOURS Performed at [facility]    Report Status PENDING  Incomplete      Studies: Dg Chest 2 View  Result Date: 05/13/2016 CLINICAL DATA:  On antibiotics for pneumonia. Increasing lower extremity swelling, shortness of breath, congestion and chest heaviness. History of COPD, prostate  cancer. EXAM: CHEST  2 VIEW COMPARISON:  Chest radiograph August 08, 2015 FINDINGS: Moderate RIGHT and small LEFT layering pleural effusions. Diffuse coarsened pulmonary interstitium, with superimposed patchy airspace opacities. Radiopaque local granuloma. Calcified mediastinal lymph nodes. Cardiac silhouette partially obscured, overall normal in size. Mildly calcified aortic knob. No pneumothorax. Soft tissue planes and included osseous structure nonsuspicious. Lower thoracic dextroscoliosis. IMPRESSION: Moderate RIGHT and small LEFT pleural effusions, patchy airspace opacities concerning for pneumonia, less likely pulmonary edema. COPD. Electronically Signed   By: Elon Alas M.D.   On: 05/13/2016 22:37   Ct Angio Chest Pe W Or Wo Contrast  Result Date: 05/14/2016 CLINICAL DATA:  Bilateral leg swelling with dyspnea history of pneumonia EXAM: CT ANGIOGRAPHY CHEST WITH CONTRAST TECHNIQUE: Multidetector CT imaging of the chest was performed using the standard protocol during bolus administration of intravenous contrast. Multiplanar CT image reconstructions and MIPs were obtained to evaluate the vascular anatomy. CONTRAST:   100 mL Isovue 370 intravenous COMPARISON:  05/13/2016 FINDINGS: Cardiovascular: Satisfactory opacification of the pulmonary arteries to the segmental level. No evidence of pulmonary embolism. Mild cardiomegaly. No large pericardial effusion. Atherosclerosis of the aorta. No dissection. Mediastinum/Nodes: Trachea is midline, there is mild debris within the distal trachea just above the carina. Thyroid gland is normal. Multiple enlarged mediastinal lymph nodes. AP window node measures 1.7 cm in short axis. Precarinal lymph node measures 1.5 cm in short axis. Poorly defined density in the sub- carinal region with different attenuation value from large pleural effusion on the right. This measures approximately 6.9 cm transverse by 5.1 cm AP. The esophagus appears deviated to the left by mass lesion. Multiple calcifications within the right hilus and mediastinum, consistent with prior granulomatous disease. Mild axillary adenopathy. Lungs/Pleura: Large right-sided pleural effusion and small left-sided pleural effusion. Marked emphysematous disease bilaterally. Dense consolidation in the right lower lobe may reflect a pneumonia. Upper Abdomen: Nodular fullness of the adrenal glands. No acute abnormality. No suspicious bone lesions. Musculoskeletal: No acute or suspicious bone lesions are visualized. There are degenerative changes of the spine. Review of the MIP images confirms the above findings. IMPRESSION: 1. No CT evidence for acute pulmonary embolus or aortic dissection. 2. Large right-sided pleural effusion and small left-sided pleural effusion. Dense consolidation in the right lower lobe suspicious for a pneumonia. Marked emphysematous disease bilaterally. 3. Poorly defined soft tissue density within the subcarinal region measuring approximately 6.9 cm is suspicious for a mass. It abuts or involves the esophagus which is displaced to the left. 4. Moderate mediastinal adenopathy. Electronically Signed   By: Donavan Foil M.D.   On: 05/14/2016 02:25    Scheduled Meds: . aspirin  81 mg Oral Daily  . azithromycin  500 mg Intravenous Q24H  . cefTRIAXone (ROCEPHIN)  IV  1 g Intravenous Q24H  . enoxaparin (LOVENOX) injection  40 mg Subcutaneous Daily  . feeding supplement (ENSURE ENLIVE)  237 mL Oral TID BM  . furosemide  40 mg Intravenous BID  . guaiFENesin  600 mg Oral BID  . ipratropium  0.5 mg Nebulization Q6H  . nicotine  21 mg Transdermal Daily  . senna  1 tablet Oral BID  . sodium chloride flush  3 mL Intravenous Q12H  . sodium chloride flush  3 mL Intravenous Q12H    Continuous Infusions:   LOS: 0 days     Annita Brod, MD [facility] Pager [phone]  If 7PM-7AM, please contact night-coverage [email or web address] Password [email or web address] 05/14/2016, 3:06 PM

## 2016-05-14 NOTE — Progress Notes (Signed)
Initial Nutrition Assessment  DOCUMENTATION CODES:   Severe malnutrition in context of chronic illness  INTERVENTION:   Provide Ensure Enlive po TID, each supplement provides 350 kcal and 20 grams of protein Encourage PO intake RD to continue to monitor  NUTRITION DIAGNOSIS:   Malnutrition related to chronic illness as evidenced by severe depletion of body fat, severe depletion of muscle mass.  GOAL:   Patient will meet greater than or equal to 90% of their needs  MONITOR:   PO intake, Supplement acceptance, Labs, Weight trends, I & O's  REASON FOR ASSESSMENT:   Malnutrition Screening Tool    ASSESSMENT:    71 yo male patient with past medical history of COPD, Smoking, prostate cancer, Pneumonia 1 year ago and inguinal hernia S/P repair who presented with a 2 week history of progressive shortness of breath on 05/13/2016.   Patient reports poor appetite for a week PTA. Pt has not been eating much and states he has been losing weight since his hernia surgery in April 2017. Pt states his "clothes didn't fit right after that". Pt is drinking ensure supplements and wants to continue the supplements.   Per patient, UBW is 183-184 lb. Pt with some weight fluctuations over the past year. Nutrition-Focused physical exam completed. Findings are severe fat depletion, severe muscle depletion, and moderate edema.   Medications: IV Lasix BID, Senokot tablet BID Labs reviewed: Low Na Mg/Phos/K WNL  Diet Order:  Diet Heart Room service appropriate? Yes; Fluid consistency: Thin  Skin:  Reviewed, no issues  Last BM:  1/2  Height:   Ht Readings from Last 1 Encounters:  05/14/16 _0  (1.803 m)    Weight:   Wt Readings from Last 1 Encounters:  05/14/16 182 lb 12.2 oz (82.9 kg)    Ideal Body Weight:  78.2 kg  BMI:  Body mass index is 25.49 kg/m.  Estimated Nutritional Needs:   Kcal:  1594-5859  Protein:  120-130g  Fluid:  2L/day  EDUCATION NEEDS:   No education  needs identified at this time  Christopher Bibles, MS, RD, LDN Pager: [phone] After Hours Pager: [phone]

## 2016-05-14 NOTE — Progress Notes (Signed)
Rx Brief note:  Lovenox  Wt= 82 kg, CrCL~55 ml/min  Rx adjusted Lovenox to 40 mg daily in pt with CrCl>30 ml/min  Thanks Lawana Pai R  05/14/2016. 3:02 AM

## 2016-05-14 NOTE — Consult Note (Signed)
Cardiology Consult    Patient ID: [name] MRN: [id number], DOB/AGE: 73/14/1947   Admit date: 05/13/2016 Date of Consult: 05/14/2016  Primary Physician: [name] Reason for Consult: CHF, Atrial Fibrillation Primary Cardiologist: New- Fransico Him MD Requesting Provider: Dr Roel Cluck  Patient Profile    [name] is a 71 yo male patient with past medical history of COPD, Smoking, prostate cancer, Pneumonia 1 year ago and inguinal hernia S/P repair who presented with a 2 week history of progressive shortness of breath on 05/13/2016. He has no known cardiac history and no previous cardiac studies or procedures. CT scan showed negative pulmonary embolism but large right-sided pleural effusion and questionable pneumonia. IV antibiotics were initiated and will check echocardiogram for heart function.  History of Present Illness    [name] is a 71 yo male patient with past medical history of COPD, Smoking, prostate cancer, Pneumonia 1 year ago and inguinal hernia S/P repair who presented with a 2 week history of progressive shortness of breath on 05/13/2016. He has no known cardiac history and no previous cardiac studies or procedures and is not followed by a cardiologist. He was diagnosed with pneumonia and placed on Levaquin  3 days ago as an outpatient. He has baseline pedal edema but became concerned when the edema progressed up to his knees after beginning the Levaquin therapy. He has had fatigue and shortness of breath with mild orthopnea, but no chest pain or tightness, palpitations or dizziness. Upon presentation To the ED his EKG showed Atrial fibrillation with rate of 96 bpm. BNP was 270.7. He is unaware of any irregular heart rhythms in his past and there are no previous EKGs for comparison. He had a physical exam a few months ago but an EKG was not done.  CT scan of the chest ruled out PE but did find large right pleural effusion,small left-sided pleural effusion, dense  consolidation in the right lower lobe suspicious for a pneumonia and a mass within the subcarinal region and moderate mediastinal adenopathy  He is being treated with antibiotics. Currently no treatment of atrial fibrillation with beta blocker or calcium channel blocker due to soft blood pressures. He is not currently anticoagulated due to possibility of thoracentesis.  [name] is feeling somewhat better this morning with improved breathing. He and his wife are quite concerned and had many questions.   Past Medical History   Past Medical History:  Diagnosis Date  . Arthritis    "neck" (08/15/2015)  . Chronic bronchitis ([place])   . Constipation   . COPD (chronic obstructive pulmonary disease) ([place])   . DDD (degenerative disc disease), cervical   . Edema of left lower extremity   . Facial basal cell cancer   . H/O acne vulgaris 1960s   "led to my discharge from the [facility] in the mid 1960s"  . Headache    history of - left temporal- years ago- not current (08/15/2015)  . Pneumonia 1960s; 2015 X 2  . Prostate cancer ([place]) dx'd early 2000s   "low spreading; non aggressive type" (08/15/2015)  . Skin cancer    "back"    Past Surgical History:  Procedure Laterality Date  . COLONOSCOPY    . EXCISIONAL HEMORRHOIDECTOMY  1960s  . INGUINAL HERNIA REPAIR Right 2002  . INGUINAL HERNIA REPAIR Bilateral 08/15/2015  . INGUINAL HERNIA REPAIR Bilateral 08/15/2015   Procedure: OPEN REPAIR RECURRENT RIGHT INGUINAL HERNIA WITH MESH AND REPAIR LEFT INGUINAL HERNIA WITH MESH;  Surgeon:  [name], MD;  Location: [facility];  Service: General;  Laterality: Bilateral;  . INSERTION OF MESH Bilateral 08/15/2015   Procedure: INSERTION OF MESH;  Surgeon: [name], MD;  Location: [facility];  Service: General;  Laterality: Bilateral;  . PROSTATE BIOPSY    . TONSILLECTOMY  1950s     Allergies  Allergies  Allergen Reactions  . Demerol [Meperidine] Other (See Comments)    Causes system to shutdown. ?      Inpatient Medications    . aspirin  81 mg Oral Daily  . azithromycin  500 mg Intravenous Q24H  . cefTRIAXone (ROCEPHIN)  IV  1 g Intravenous Q24H  . enoxaparin (LOVENOX) injection  40 mg Subcutaneous Daily  . feeding supplement (ENSURE ENLIVE)  237 mL Oral BID BM  . furosemide  40 mg Intravenous BID  . guaiFENesin  600 mg Oral BID  . iopamidol      . ipratropium  0.5 mg Nebulization Q6H  . nicotine  21 mg Transdermal Daily  . senna  1 tablet Oral BID  . sodium chloride flush  3 mL Intravenous Q12H  . sodium chloride flush  3 mL Intravenous Q12H    Family History    Family History  Problem Relation Age of Onset  . Alcoholism Father   . CAD Father   . Ovarian cancer Sister   . Diabetes Neg Hx     Social History    Social History   Social History  . Marital status: Married    Spouse name: N/A  . Number of children: N/A  . Years of education: N/A   Occupational History  . Not on file.   Social History Main Topics  . Smoking status: Current Every Day Smoker    Packs/day: 1.00    Years: 62.00    Types: Cigarettes  . Smokeless tobacco: Never Used  . Alcohol use No  . Drug use: No  . Sexual activity: Not Currently   Other Topics Concern  . Not on file   Social History Narrative  . No narrative on file     Review of Systems    General:  Positive for fatigue Cardiovascular:  No chest pain, palpitations, paroxysmal nocturnal dyspnea. Positive for shortness of breath and mild orthopnea and pedal edema recently progressed up to his knees Dermatological: No rash, lesions/masses Respiratory: Positive for cough and shortness of breath Urologic: No hematuria, dysuria Abdominal:   No nausea, vomiting, diarrhea, bright red blood per rectum, melena, or hematemesis Neurologic:  No visual changes, wkns, changes in mental status. All other systems reviewed and are otherwise negative except as noted above.  Physical Exam    Blood pressure 104/66, pulse 89,  temperature 98.4 F (36.9 C), temperature source Oral, resp. rate 20, height _0  (1.803 m), weight 182 lb 12.2 oz (82.9 kg), SpO2 99 %.  General: Pleasant, NAD Psych: Normal affect, mildly anxious Neuro: Alert and oriented X 3. Moves all extremities spontaneously. HEENT: Normal  Neck: Supple without bruits, JVD present Lungs:  Resp regular and unlabored, CTA. Heart: Irregularly irregular rhythm, no s3, s4, or murmurs. Abdomen: Soft, non-tender, non-distended, BS + x 4.  Extremities: No clubbing or cyanosis. 1-2+ pretibial pitting edema DP/PT/Radials 2+ and equal bilaterally.  Labs    Troponin [facility] Test)  Recent Labs  05/13/16 2333  TROPIPOC 0.04    Recent Labs  05/14/16 0037 05/14/16 0604  TROPONINI 0.05* 0.05*   Lab Results  Component Value Date  WBC 16.8 (H) 05/14/2016   HGB 12.2 (L) 05/14/2016   HCT 35.9 (L) 05/14/2016   MCV 88.4 05/14/2016   PLT 332 05/14/2016    Recent Labs Lab 05/14/16 0347  NA 131*  K 4.3  CL 99*  CO2 23  BUN 39*  CREATININE 1.08  CALCIUM 8.1*  PROT 5.8*  BILITOT 1.3*  ALKPHOS 219*  ALT 30  AST 33  GLUCOSE 105*   No results found for: CHOL, HDL, LDLCALC, TRIG No results found for: [facility]   Radiology Studies    Dg Chest 2 View  Result Date: 05/13/2016 CLINICAL DATA:  On antibiotics for pneumonia. Increasing lower extremity swelling, shortness of breath, congestion and chest heaviness. History of COPD, prostate cancer. EXAM: CHEST  2 VIEW COMPARISON:  Chest radiograph August 08, 2015 FINDINGS: Moderate RIGHT and small LEFT layering pleural effusions. Diffuse coarsened pulmonary interstitium, with superimposed patchy airspace opacities. Radiopaque local granuloma. Calcified mediastinal lymph nodes. Cardiac silhouette partially obscured, overall normal in size. Mildly calcified aortic knob. No pneumothorax. Soft tissue planes and included osseous structure nonsuspicious. Lower thoracic dextroscoliosis. IMPRESSION: Moderate  RIGHT and small LEFT pleural effusions, patchy airspace opacities concerning for pneumonia, less likely pulmonary edema. COPD. Electronically Signed   By: Elon Alas M.D.   On: 05/13/2016 22:37   Ct Angio Chest Pe W Or Wo Contrast  Result Date: 05/14/2016 CLINICAL DATA:  Bilateral leg swelling with dyspnea history of pneumonia EXAM: CT ANGIOGRAPHY CHEST WITH CONTRAST TECHNIQUE: Multidetector CT imaging of the chest was performed using the standard protocol during bolus administration of intravenous contrast. Multiplanar CT image reconstructions and MIPs were obtained to evaluate the vascular anatomy. CONTRAST:  100 mL Isovue 370 intravenous COMPARISON:  05/13/2016 FINDINGS: Cardiovascular: Satisfactory opacification of the pulmonary arteries to the segmental level. No evidence of pulmonary embolism. Mild cardiomegaly. No large pericardial effusion. Atherosclerosis of the aorta. No dissection. Mediastinum/Nodes: Trachea is midline, there is mild debris within the distal trachea just above the carina. Thyroid gland is normal. Multiple enlarged mediastinal lymph nodes. AP window node measures 1.7 cm in short axis. Precarinal lymph node measures 1.5 cm in short axis. Poorly defined density in the sub- carinal region with different attenuation value from large pleural effusion on the right. This measures approximately 6.9 cm transverse by 5.1 cm AP. The esophagus appears deviated to the left by mass lesion. Multiple calcifications within the right hilus and mediastinum, consistent with prior granulomatous disease. Mild axillary adenopathy. Lungs/Pleura: Large right-sided pleural effusion and small left-sided pleural effusion. Marked emphysematous disease bilaterally. Dense consolidation in the right lower lobe may reflect a pneumonia. Upper Abdomen: Nodular fullness of the adrenal glands. No acute abnormality. No suspicious bone lesions. Musculoskeletal: No acute or suspicious bone lesions are visualized. There  are degenerative changes of the spine. Review of the MIP images confirms the above findings. IMPRESSION: 1. No CT evidence for acute pulmonary embolus or aortic dissection. 2. Large right-sided pleural effusion and small left-sided pleural effusion. Dense consolidation in the right lower lobe suspicious for a pneumonia. Marked emphysematous disease bilaterally. 3. Poorly defined soft tissue density within the subcarinal region measuring approximately 6.9 cm is suspicious for a mass. It abuts or involves the esophagus which is displaced to the left. 4. Moderate mediastinal adenopathy. Electronically Signed   By: Donavan Foil M.D.   On: 05/14/2016 02:25    EKG & Cardiac Imaging    EKG:  05/13/2016-atrial fibrillation, 96 bpm, with repolarization abnormality and PVC 05/14/2016- atrial fibrillation, 89 bpm,  with nonspecificT wave changes and repolarization abnormality  Echocardiogram: Pending  Assessment & Plan    1. Atrial fibrillation -This is a new diagnosis, but with controlled rate it is of unknown duration. This may be a stress reaction to his pulmonary issues or may have been present for some time as there is no previous EKG for comparison and the patient states that no EKG was done at his last physical -CHA2DS2-VASc score is 1 for age, but may be higher pending echocardiogram and presence of heart failure -Anticoagulation has been deferred at this time due to pleural effusion possible need for thoracentesis, being followed by internal medicine. Will revisit anticoagulation in the next few days -Rates is fairly well-controlled with rates in the 90s on telemetry and beta blocker or calcium channel blocker has been deferred at this time due to soft blood pressure with systolic blood pressure 22-575 -Can use metorprolol if needed for rate control   2. Heart failure -Echocardiogram is pending to evaluate cardiac function, wall motion and valve status. No previous echocardiogram on file -Pedal  edema, shortness of breath, mild orthopnea and JVD are present -Patient has a history of long time smoker -Patient receiving Lasix 40 mg IV twice a day and patient is at -800 mL fluid status since admission -Weight is up 2 lbs. 12 oz. overnight but question whether this was on the same scale -Breathing is somewhat better this morning per patient and he is found lying flat in bed without respiratory difficulty on rounds  3. Elevated troponins -Troponins are 0.05 and 0.05, mildly elevated with a flat pattern and this is likely related to demand ischemia insetting of new onset atrial fibrillation and possible pneumonia  4. Shortness of breath -COPD -Nebulizers as needed per internal medicine -Smoking cessation advised -Leukocytosis being evaluated by internal medicine for possible pneumonia, antibiotics in process -CT scan ruled out PE but did show Large right-sided pleural effusion and small left-sided pleural effusion. Dense consolidation in the right lower lobe suspicious for a pneumonia and a mass within the subcarinal region and moderate mediastinal adenopathy  Signed, Daune Perch, NP-C 05/14/2016, 8:16 AM

## 2016-05-14 NOTE — Progress Notes (Signed)
05/14/16  1238  Called Lab to see if Troponin had been drawn. Per Lab no troponin collected. Spoke with phelbotomist they will be up shortly.

## 2016-05-14 NOTE — Procedures (Signed)
Thoracentesis Procedure Note  Pre-operative Diagnosis: right pleural effusion  Post-operative Diagnosis: same  Indications: dyspnea  Procedure Details  Consent: Informed consent was obtained. Risks of the procedure were discussed including: infection, bleeding, pain, pneumothorax.  Under sterile conditions the patient was positioned. Betadine solution and sterile drapes were utilized.  1% buffered lidocaine was used to anesthetize the 9th rib space. Fluid was obtained without any difficulties and minimal blood loss.  A dressing was applied to the wound and wound care instructions were provided.   Findings 1500 ml of cloudy pleural fluid was obtained. A sample was sent to Pathology for cytogenetics, flow, and cell counts, as well as for infection analysis.  Complications:  None; patient tolerated the procedure well.          Condition: stable  Plan A follow up chest x-ray was ordered. Bed Rest for 0 hours. Tylenol 650 mg. for pain.  Attending Attestation: I performed the procedure.  Roselie Awkward, MD [place] [facility] Pager: [phone] Cell: [phone] After 3pm or if no response, call [phone]

## 2016-05-15 DIAGNOSIS — I5031 Acute diastolic (congestive) heart failure: Secondary | ICD-10-CM

## 2016-05-15 DIAGNOSIS — J181 Lobar pneumonia, unspecified organism: Secondary | ICD-10-CM

## 2016-05-15 LAB — CBC
HEMATOCRIT: 39.5 % (ref 39.0–52.0)
Hemoglobin: 13.5 g/dL (ref 13.0–17.0)
MCH: 30.3 pg (ref 26.0–34.0)
MCHC: 34.2 g/dL (ref 30.0–36.0)
MCV: 88.6 fL (ref 78.0–100.0)
PLATELETS: 331 10*3/uL (ref 150–400)
RBC: 4.46 MIL/uL (ref 4.22–5.81)
RDW: 15.1 % (ref 11.5–15.5)
WBC: 12.3 10*3/uL — ABNORMAL HIGH (ref 4.0–10.5)

## 2016-05-15 LAB — BASIC METABOLIC PANEL
Anion gap: 7 (ref 5–15)
BUN: 40 mg/dL — AB (ref 6–20)
CALCIUM: 8.4 mg/dL — AB (ref 8.9–10.3)
CO2: 27 mmol/L (ref 22–32)
Chloride: 99 mmol/L — ABNORMAL LOW (ref 101–111)
Creatinine, Ser: 1.12 mg/dL (ref 0.61–1.24)
GFR calc Af Amer: >60 mL/min (ref >60)
Glucose, Bld: 109 mg/dL — ABNORMAL HIGH (ref 65–99)
POTASSIUM: 3.6 mmol/L (ref 3.5–5.1)
Sodium: 133 mmol/L — ABNORMAL LOW (ref 135–145)

## 2016-05-15 LAB — PH, BODY FLUID: pH, Body Fluid: 7.7

## 2016-05-15 LAB — HEPARIN LEVEL (UNFRACTIONATED): Heparin Unfractionated: <0.1 IU/mL — ABNORMAL LOW (ref 0.30–0.70)

## 2016-05-15 MED ORDER — HEPARIN (PORCINE) IN NACL 100-0.45 UNIT/ML-% IJ SOLN
1600.0000 [IU]/h | INTRAMUSCULAR | Status: DC
  Administered 2016-05-15: 1300 [IU]/h via INTRAVENOUS
  Filled 2016-05-15 (×2): qty 250

## 2016-05-15 MED ORDER — FUROSEMIDE 10 MG/ML IJ SOLN
80.0000 mg | Freq: Two times a day (BID) | INTRAMUSCULAR | Status: DC
  Administered 2016-05-15 – 2016-05-16 (×2): 80 mg via INTRAVENOUS
  Filled 2016-05-15 (×2): qty 8

## 2016-05-15 MED ORDER — AZITHROMYCIN 500 MG PO TABS
500.0000 mg | ORAL_TABLET | Freq: Every day | ORAL | Status: DC
  Administered 2016-05-16 – 2016-05-19 (×4): 500 mg via ORAL
  Filled 2016-05-15 (×2): qty 2
  Filled 2016-05-15 (×2): qty 1
  Filled 2016-05-15: qty 2

## 2016-05-15 MED ORDER — POTASSIUM CHLORIDE CRYS ER 20 MEQ PO TBCR
20.0000 meq | EXTENDED_RELEASE_TABLET | Freq: Two times a day (BID) | ORAL | Status: DC
  Administered 2016-05-15 – 2016-05-19 (×9): 20 meq via ORAL
  Filled 2016-05-15 (×9): qty 1

## 2016-05-15 MED ORDER — SALINE SPRAY 0.65 % NA SOLN
1.0000 | NASAL | Status: DC | PRN
  Administered 2016-05-16: 1 via NASAL
  Filled 2016-05-15 (×2): qty 44

## 2016-05-15 MED ORDER — HEPARIN BOLUS VIA INFUSION
2000.0000 [IU] | Freq: Once | INTRAVENOUS | Status: AC
  Administered 2016-05-15: 2000 [IU] via INTRAVENOUS
  Filled 2016-05-15: qty 2000

## 2016-05-15 NOTE — Progress Notes (Signed)
[facility]  S:  Feels about the same Still has some shortness of breath  Objective: Vitals:   05/14/16 2038 05/15/16 0500 05/15/16 0601 05/15/16 0935  BP: 100/64  99/69   Pulse: 86  79   Resp: 18  18   Temp: 98.2 F (36.8 C)  97.7 F (36.5 C)   TempSrc: Oral  Oral   SpO2: 97%  98% 100%  Weight:  182 lb 12.2 oz (82.9 kg)    Height:       2L [place]  Gen: well appearing HENT: OP clear, TM's clear, neck supple PULM: CTA B, normal percussion, diminished bases CV: Irreg, no mgr, trace edema GI: BS+, soft, nontender Derm: no cyanosis or rash Psyche: normal mood and affect  Chest x-ray from 05/14/2016 images personally reviewed showing a small pleural effusion on the right  Cytology still not back  Early return on pleural fluid analysis shows evidence of a transudate with a neutrophilic predominant white blood cell differential and a slightly low glucose  Impression: Transudate pleural effusion: I still worry that this is primarily related to his underlying malignancy but I'm not sure how to interpret the low glucose. This can be seen in some inflammatory and infectious conditions. Unfortunately a pH was not performed on the pleural fluid. Gram staining is negative. At this point I don't think there is a role for repeat chest drainage or a chest tube placement. Will follow-up cytology. Repeat chest x-ray when necessary  Lung mass: If cytology from the pleural fluid is negative then we will perform a bronchoscopy. Unfortunately there are no open slots for an endobronchial ultrasound bronchoscopy this week.  Roselie Awkward, MD [place] [facility] Pager: [phone] Cell: [phone] After 3pm or if no response, call [phone]

## 2016-05-15 NOTE — Progress Notes (Addendum)
Patient Name: [name] Date of Encounter: 05/15/2016  Primary Cardiologist: [facility] Problem List     Active Problems:   Hyponatremia   A-fib (HCC)   CHF (congestive heart failure) (HCC)   Pleural effusion   COPD (chronic obstructive pulmonary disease) (HCC)   Fluid overload   CAP (community acquired pneumonia)   Tobacco abuse   Atrial fibrillation (HCC)   Bilateral lower extremity edema   Acute congestive heart failure ([place])   Mass in chest   Protein-calorie malnutrition, severe ([place])     Subjective   Patient is feeling well without chest pain, dizziness or palpitations. He still has DOE, better than on admission. He reports having had mild increase in his urination. He is understandably concerned about being told that he has a mass in his lung.  Inpatient Medications    Scheduled Meds: . aspirin  81 mg Oral Daily  . azithromycin  500 mg Intravenous Q24H  . cefTRIAXone (ROCEPHIN)  IV  1 g Intravenous Q24H  . enoxaparin (LOVENOX) injection  40 mg Subcutaneous Daily  . feeding supplement (ENSURE ENLIVE)  237 mL Oral TID BM  . furosemide  40 mg Intravenous BID  . guaiFENesin  600 mg Oral BID  . ipratropium  0.5 mg Nebulization TID  . levalbuterol  1.25 mg Nebulization TID  . nicotine  21 mg Transdermal Daily  . senna  1 tablet Oral BID  . sodium chloride flush  3 mL Intravenous Q12H  . sodium chloride flush  3 mL Intravenous Q12H   Continuous Infusions:  PRN Meds: sodium chloride, acetaminophen **OR** acetaminophen, HYDROcodone-acetaminophen, levalbuterol, ondansetron **OR** ondansetron (ZOFRAN) IV, polyethylene glycol, sodium chloride, sodium chloride flush   Vital Signs    Vitals:   05/14/16 1951 05/14/16 2038 05/15/16 0500 05/15/16 0601  BP:  100/64  99/69  Pulse:  86  79  Resp:  18  18  Temp:  98.2 F (36.8 C)  97.7 F (36.5 C)  TempSrc:  Oral  Oral  SpO2: 97% 97%  98%  Weight:   182 lb 12.2 oz (82.9 kg)   Height:         Intake/Output Summary (Last 24 hours) at 05/15/16 0801 Last data filed at 05/15/16 0426  Gross per 24 hour  Intake             1364 ml  Output              950 ml  Net              414 ml   Filed Weights   05/13/16 1843 05/14/16 0245 05/15/16 0500  Weight: 180 lb (81.6 kg) 182 lb 12.2 oz (82.9 kg) 182 lb 12.2 oz (82.9 kg)    Physical Exam    GEN: Well nourished, well developed, caucasian male in no acute distress.  HEENT: Grossly normal.  Neck: Supple, no JVD, carotid bruits, or masses. Cardiac: RRR, no murmurs, rubs, or gallops. Mild clubbing of the fingernails. 2+ pitting edema of the lower legs. Radials/DP/PT 2+ and equal bilaterally.  Respiratory:  Respirations regular and unlabored, few crackles in left posterior base GI: Soft, nontender, nondistended, BS + x 4. MS: no deformity or atrophy. Skin: warm and dry, no rash. Neuro:  Strength and sensation are intact. Psych: AAOx3.  Normal affect.  Labs    CBC  Recent Labs  05/13/16 2259 05/14/16 0347  WBC 20.2* 16.8*  NEUTROABS 17.0*  --   HGB 13.8  12.2*  HCT 39.4 35.9*  MCV 87.4 88.4  PLT 359 678   Basic Metabolic Panel  Recent Labs  05/13/16 2259 05/14/16 0347  NA 130* 131*  K 4.4 4.3  CL 98* 99*  CO2 24 23  GLUCOSE 113* 105*  BUN 40* 39*  CREATININE 1.29* 1.08  CALCIUM 8.5* 8.1*  MG 1.8 1.9  PHOS  --  4.0   Liver Function Tests  Recent Labs  05/14/16 0037 05/14/16 0347 05/14/16 1642  AST 38 33  --   ALT 30 30  --   ALKPHOS 218* 219*  --   BILITOT 1.0 1.3*  --   PROT 5.7* 5.8* 6.4*  ALBUMIN 2.1* 2.2*  --    No results for input(s): LIPASE, AMYLASE in the last 72 hours. Cardiac Enzymes  Recent Labs  05/14/16 0037 05/14/16 0604 05/14/16 1247  TROPONINI 0.05* 0.05* 0.06*   BNP Invalid input(s): POCBNP D-Dimer No results for input(s): DDIMER in the last 72 hours. Hemoglobin A1C No results for input(s): HGBA1C in the last 72 hours. Fasting Lipid Panel No results for input(s):  CHOL, HDL, LDLCALC, TRIG, CHOLHDL, LDLDIRECT in the last 72 hours. Thyroid Function Tests  Recent Labs  05/14/16 0347  TSH 1.187    Telemetry    Atrial fibrillation with rates int he 70's-90's and PVCs - Personally Reviewed  ECG    No new tracings  Radiology    Dg Chest 2 View  Result Date: 05/13/2016 CLINICAL DATA:  On antibiotics for pneumonia. Increasing lower extremity swelling, shortness of breath, congestion and chest heaviness. History of COPD, prostate cancer. EXAM: CHEST  2 VIEW COMPARISON:  Chest radiograph August 08, 2015 FINDINGS: Moderate RIGHT and small LEFT layering pleural effusions. Diffuse coarsened pulmonary interstitium, with superimposed patchy airspace opacities. Radiopaque local granuloma. Calcified mediastinal lymph nodes. Cardiac silhouette partially obscured, overall normal in size. Mildly calcified aortic knob. No pneumothorax. Soft tissue planes and included osseous structure nonsuspicious. Lower thoracic dextroscoliosis. IMPRESSION: Moderate RIGHT and small LEFT pleural effusions, patchy airspace opacities concerning for pneumonia, less likely pulmonary edema. COPD. Electronically Signed   By: Elon Alas M.D.   On: 05/13/2016 22:37   Ct Angio Chest Pe W Or Wo Contrast  Result Date: 05/14/2016 CLINICAL DATA:  Bilateral leg swelling with dyspnea history of pneumonia EXAM: CT ANGIOGRAPHY CHEST WITH CONTRAST TECHNIQUE: Multidetector CT imaging of the chest was performed using the standard protocol during bolus administration of intravenous contrast. Multiplanar CT image reconstructions and MIPs were obtained to evaluate the vascular anatomy. CONTRAST:  100 mL Isovue 370 intravenous COMPARISON:  05/13/2016 FINDINGS: Cardiovascular: Satisfactory opacification of the pulmonary arteries to the segmental level. No evidence of pulmonary embolism. Mild cardiomegaly. No large pericardial effusion. Atherosclerosis of the aorta. No dissection. Mediastinum/Nodes: Trachea is  midline, there is mild debris within the distal trachea just above the carina. Thyroid gland is normal. Multiple enlarged mediastinal lymph nodes. AP window node measures 1.7 cm in short axis. Precarinal lymph node measures 1.5 cm in short axis. Poorly defined density in the sub- carinal region with different attenuation value from large pleural effusion on the right. This measures approximately 6.9 cm transverse by 5.1 cm AP. The esophagus appears deviated to the left by mass lesion. Multiple calcifications within the right hilus and mediastinum, consistent with prior granulomatous disease. Mild axillary adenopathy. Lungs/Pleura: Large right-sided pleural effusion and small left-sided pleural effusion. Marked emphysematous disease bilaterally. Dense consolidation in the right lower lobe may reflect a pneumonia. Upper Abdomen: Nodular  fullness of the adrenal glands. No acute abnormality. No suspicious bone lesions. Musculoskeletal: No acute or suspicious bone lesions are visualized. There are degenerative changes of the spine. Review of the MIP images confirms the above findings. IMPRESSION: 1. No CT evidence for acute pulmonary embolus or aortic dissection. 2. Large right-sided pleural effusion and small left-sided pleural effusion. Dense consolidation in the right lower lobe suspicious for a pneumonia. Marked emphysematous disease bilaterally. 3. Poorly defined soft tissue density within the subcarinal region measuring approximately 6.9 cm is suspicious for a mass. It abuts or involves the esophagus which is displaced to the left. 4. Moderate mediastinal adenopathy. Electronically Signed   By: Donavan Foil M.D.   On: 05/14/2016 02:25   Dg Chest Port 1 View  Result Date: 05/14/2016 CLINICAL DATA:  Post thoracentesis right EXAM: PORTABLE CHEST 1 VIEW COMPARISON:  05/13/2016 FINDINGS: Interval decrease in right pleural effusion following thoracentesis. No pneumothorax. Prominent interstitial lung markings  diffusely most consistent with heart failure and edema. Bilateral effusions and bibasilar atelectasis. Progression of left lower lobe atelectasis since yesterday. IMPRESSION: No pneumothorax post right thoracentesis Diffuse interstitial edema most likely due to heart failure. Bibasilar atelectasis and effusion with progression of left lower lobe atelectasis since yesterday. Electronically Signed   By: Franchot Gallo M.D.   On: 05/14/2016 16:22    Cardiac Studies   05/14/2016- ECHO Study Conclusions - Left ventricle: The cavity size was normal. Wall thickness was   increased in a pattern of mild LVH. Systolic function was   vigorous. The estimated ejection fraction was in the range of 65%   to 70%. Indeterminant diastolic function (atrial fibrillation).   Wall motion was normal; there were no regional wall motion   abnormalities. - Ventricular septum: D-shaped interventricular septum, suggesting   RV pressure/volume overload. - Aortic valve: There was no stenosis. - Aorta: Mildly dilated aortic root. Aortic root dimension: 39 mm   (ED). - Mitral valve: Mildly calcified annulus. There was trivial   regurgitation. - Left atrium: The atrium was moderately to severely dilated. - Right ventricle: The cavity size was moderately dilated. Systolic   function was normal. - Right atrium: The atrium was moderately to severely dilated.   Chiari network noted. - Tricuspid valve: Peak RV-RA gradient (S): 28 mm Hg. - Pulmonary arteries: PA peak pressure: 43 mm Hg (S). - Systemic veins: IVC measured 2.5 cm with < 50% respirophasic   variation, suggesting RA pressure 15 mmHg. - Pericardium, extracardiac: A trivial pericardial effusion was   identified.  Impressions: - The patient was in atrial fibrillation. Normal LV size with mild   LV hypertrophy. EF 65-70%. D-shaped interventricular septum   suggestive of RV pressure/volume overload. Moderately dilated RV   with normal systolic function. Dilated  IVC suggestive of elevated   RV filling pressure. Mild pulmonary hypertension.  -------------------------------------------------------------------   Patient Profile     Mr. Ben is a 71 yo male patient with past medical history of COPD, Smoking, prostate cancer, Pneumonia 1 year ago and inguinal hernia S/P repair who presented  on 05/13/2016 with a 2 week history of progressive shortness of breath treated as outpt with Levaquin and no improvement. He has no known cardiac history and no previous cardiac studies or procedures. On admit, Chest CT showed no PE but large right pleural effusion with PNA.  BNP elevated at 270 with mildly elevated trop and new on set afib rate controlled of unknown duration.  Assessment & Plan    1. Atrial  fibrillation -This is a new diagnosis, but with controlled rate. It is of unknown duration. This may be a stress reaction to his pulmonary issues or may have been present for some time as there is no previous EKG for comparison and the patient states that no EKG was done at his last physical -CHA2DS2-VASc score is 2 for age and diastolic CHF. -Anticoagulation had been deferred due to pleural effusions and need for thoracentesis but now s/p thoracentesis.  He may need bx for lung mass so would consider adding DOAC only once all procedures have been completed.  For now would start IV Heparin gtt if ok with TRH.   -Rate is well-controlled with rates in the 70's- 90s on telemetry and beta blocker or calcium channel blocker has been deferred at this time due to soft blood pressure with systolic blood pressure 66-294'T, asymptomatic -Can use metorprolol if needed for rate control  2. Diastolic Heart failure with elevated RV pressure -Admitted with Pedal edema, shortness of breath, mild orthopnea and JVD  -Echocardiogram 05/14/16 showed Normal LV size with mild LV hypertrophy. EF 65-70%. D-shaped interventricular septum suggestive of RV pressure/volume overload. Moderately  dilated RV with normal systolic function. Dilated IVC suggestive of elevated RV filling pressure. Mild pulmonary hypertension. -Patient has a history of long time smoker -Patient receiving Lasix 40 mg IV twice a day - will increase to 57m BID given volume overload on both exam and echo and no significant change in weight.  Will add supplemental potassium. -Will follow renal function with BMP  -I&O not accurate as had multiple unmeasured urine occurences -Weight is documented as the same as yesterday -Pt reports mild increase in urine output -Breathing is somewhat better per patient   3. Elevated troponins -Troponins are 0.05, 0.05, 0.06 mildly elevated with a flat pattern and this is likely related to demand ischemia insetting of new onset atrial fibrillation and possible pneumonia  4. Shortness of breath -COPD -Nebulizers as needed per internal medicine -Smoking cessation advised -Leukocytosis being evaluated by internal medicine for possible pneumonia, antibiotics in process -CT scan ruled out PE but did show Large right-sided pleural effusion and small left-sided pleural effusion. Dense consolidation in the right lower lobe suspicious for a pneumonia and a mass within the subcarinal region and moderate mediastinal adenopathy -Right thoracentesis 1500 ml of cloudy fluid sent for pathology, Mesothelial cells noted. WBC's 7,920  Signed, JDaune Perch NP  05/15/2016, 8:01 AM   Patient seen and independently examined with JDaune Perch NP. We discussed all aspects of the encounter. I agree with the assessment and plan as stated above.  Patient remains volume overloaded with no significant change in weight from admit and markedly edematous in LE.  Will increase Lasix to 866mBID and add suppl potassium.  Recommend starting IV Heparin gtt for afib if ok with TRH until all procedures completed.  Signed: TrFransico HimMD [facility] 05/15/2016

## 2016-05-15 NOTE — Progress Notes (Addendum)
PROGRESS NOTE  [name] NWG:[id number] DOB: July 22, 1945 DOA: 05/13/2016 PCP: [name]  HPI/Recap of past 24 hours: 71 year old male with past medical history of prostate cancer, tobacco use and COPD admitted for one week of worsening dyspnea and found to be in acute respiratory failure secondary to pneumonia, possible CHF and also in atrial fibrillation. Patient noted to have bilateral pleural effusions although right side significantly large. Patient also noted to have a fair-sized mass on CT scan within the subcarinal region. Patient started on IV antibiotics plus supplemental oxygen. Patient seen by cardiology and started on Lasix for diastolic heart failure plus IV heparin for anticoagulation for A. fib. Seen by pulmonary and patient underwent thoracentesis on 1/2 yielding 1.5 L of fluid. Fluid sent for cytology which is currently pending.  Patient himself feeling much better today. No complaints breathing a lot easier. He states that last night he slept much more solidly that he has a long time.  Assessment/Plan: Active Problems:   Hyponatremia: Mild secondary to dehydration.  Acute respiratory failure with hypoxia secondary to COPD, pneumonia and heart failure: Chronic 2 L nasal cannula to keep oxygen saturation greater than 90%. Breathing much easier following thoracentesis.    New-onset A-fib [facility]): New diagnosis. Rate controlled. Checking echocardiogram. Chads 2 score 4. On heparin drip now. Will need long-term oral anticoagulation once all tests and workup complete    Acute diastolic CHF (congestive heart failure) ([place]): Echocardiogram noted enlarged atria, preserved ejection fraction. Started on Lasix. Has diuresed about 1/2 L so far.    Pleural effusion: Large on right, small on left. In part from heart failure, but also may be inflammatory. Status post thoracentesis on right side yielding 1.5 L. Fluid pending for cytology.   COPD (chronic obstructive pulmonary  disease) ([place]): On nebulizers. Plus oxygen and antibiotics    CAP (community acquired pneumonia): Elevated pro calcitonin level, have started IV antibiotics. Responded well to this.  Follow-up pro-calcitonin level on 1/4.   Tobacco abuse: Counseled to quit    Mass in chest: Unclear etiology. Suspicious for malignancy. Awaiting cytology from thoracentesis. If this is unrevealing, plans are for bronchoscopy    Protein-calorie malnutrition, severe [facility]): Patient meets criteria in the context of chronic illness. Seen by nutrition. Started on ensure 3 times a day  Code Status: Full code   Family Communication: Wife at the bedside  Disposition Plan: Continue as inpatient.   Consultants:  Pulmonary   Procedures:  Echocardiogram done 1/2: Enlarged atria, preserved ejection fraction  Thoracentesis done 1/2 : 1.5 L fluid consistent with transudate. Awaiting cytology.  Antimicrobials:  IV Rocephin and Zithromax 1/1-present  Prior to admission, patient on Levaquin 12/30-12/31   DVT prophylaxis:  Lovenox   Objective: Vitals:   05/14/16 2038 05/15/16 0500 05/15/16 0601 05/15/16 0935  BP: 100/64  99/69   Pulse: 86  79   Resp: 18  18   Temp: 98.2 F (36.8 C)  97.7 F (36.5 C)   TempSrc: Oral  Oral   SpO2: 97%  98% 100%  Weight:  82.9 kg (182 lb 12.2 oz)    Height:        Intake/Output Summary (Last 24 hours) at 05/15/16 1355 Last data filed at 05/15/16 1100  Gross per 24 hour  Intake              837 ml  Output              950 ml  Net             -  113 ml   Filed Weights   05/13/16 1843 05/14/16 0245 05/15/16 0500  Weight: 81.6 kg (180 lb) 82.9 kg (182 lb 12.2 oz) 82.9 kg (182 lb 12.2 oz)    Exam:   General:  Alert and oriented 3, no acute distress   Cardiovascular: Irregular rhythm, rate controlled   Respiratory: Decreased breath sounds bibasilar, otherwise better airway exchange  Abdomen: Soft, nontender, nondistended, positive bowel sounds    Musculoskeletal: 2+ pitting edema bilaterally from the ankles down  Skin: No skin breaks, tears or lesions  Psychiatry: Patient is appropriate, no evidence of psychoses    Data Reviewed: CBC:  Recent Labs Lab 05/13/16 2259 05/14/16 0347 05/15/16 0911  WBC 20.2* 16.8* 12.3*  NEUTROABS 17.0*  --   --   HGB 13.8 12.2* 13.5  HCT 39.4 35.9* 39.5  MCV 87.4 88.4 88.6  PLT 359 332 557   Basic Metabolic Panel:  Recent Labs Lab 05/13/16 2259 05/14/16 0347 05/15/16 0911  NA 130* 131* 133*  K 4.4 4.3 3.6  CL 98* 99* 99*  CO2 _0 GLUCOSE 113* 105* 109*  BUN 40* 39* 40*  CREATININE 1.29* 1.08 1.12  CALCIUM 8.5* 8.1* 8.4*  MG 1.8 1.9  --   PHOS  --  4.0  --    GFR: Estimated Creatinine Clearance: 63.5 mL/min (by C-G formula based on SCr of 1.12 mg/dL). Liver Function Tests:  Recent Labs Lab 05/14/16 0037 05/14/16 0347 05/14/16 1642  AST 38 33  --   ALT 30 30  --   ALKPHOS 218* 219*  --   BILITOT 1.0 1.3*  --   PROT 5.7* 5.8* 6.4*  ALBUMIN 2.1* 2.2*  --    No results for input(s): LIPASE, AMYLASE in the last 168 hours. No results for input(s): AMMONIA in the last 168 hours. Coagulation Profile: No results for input(s): INR, PROTIME in the last 168 hours. Cardiac Enzymes:  Recent Labs Lab 05/14/16 0037 05/14/16 0604 05/14/16 1247  TROPONINI 0.05* 0.05* 0.06*   BNP (last 3 results) No results for input(s): PROBNP in the last 8760 hours. HbA1C: No results for input(s): HGBA1C in the last 72 hours. CBG: No results for input(s): GLUCAP in the last 168 hours. Lipid Profile: No results for input(s): CHOL, HDL, LDLCALC, TRIG, CHOLHDL, LDLDIRECT in the last 72 hours. Thyroid Function Tests:  Recent Labs  05/14/16 0347  TSH 1.187   Anemia Panel: No results for input(s): VITAMINB12, FOLATE, FERRITIN, TIBC, IRON, RETICCTPCT in the last 72 hours. Urine analysis:    Component Value Date/Time   COLORURINE YELLOW 05/14/2016 0032   APPEARANCEUR CLEAR  05/14/2016 0032   LABSPEC 1.011 05/14/2016 0032   PHURINE 5.0 05/14/2016 0032   GLUCOSEU NEGATIVE 05/14/2016 0032   HGBUR NEGATIVE 05/14/2016 0032   BILIRUBINUR NEGATIVE 05/14/2016 0032   KETONESUR NEGATIVE 05/14/2016 0032   PROTEINUR 30 (A) 05/14/2016 0032   NITRITE NEGATIVE 05/14/2016 0032   LEUKOCYTESUR NEGATIVE 05/14/2016 0032   Sepsis Labs: _1 (procalcitonin:4,lacticidven:4)  ) Recent Results (from the past 240 hour(s))  Blood culture (routine x 2)     Status: None (Preliminary result)   Collection Time: 05/14/16 12:02 AM  Result Value Ref Range Status   Specimen Description BLOOD RIGHT ANTECUBITAL  Final   Special Requests BOTTLES DRAWN AEROBIC AND ANAEROBIC 5ML  Final   Culture   Final    NO GROWTH < 12 HOURS Performed at [facility]    Report Status PENDING  Incomplete  Blood culture (  routine x 2)     Status: None (Preliminary result)   Collection Time: 05/14/16 12:02 AM  Result Value Ref Range Status   Specimen Description BLOOD LEFT ANTECUBITAL  Final   Special Requests BOTTLES DRAWN AEROBIC AND ANAEROBIC 5ML  Final   Culture   Final    NO GROWTH < 12 HOURS Performed at [facility]    Report Status PENDING  Incomplete  Body fluid culture     Status: None (Preliminary result)   Collection Time: 05/14/16  3:30 PM  Result Value Ref Range Status   Specimen Description PLEURAL  Final   Special Requests NONE  Final   Gram Stain   Final    MODERATE WBC PRESENT, PREDOMINANTLY MONONUCLEAR NO ORGANISMS SEEN Performed at [facility]    Culture PENDING  Incomplete   Report Status PENDING  Incomplete      Studies: Dg Chest Port 1 View  Result Date: 05/14/2016 CLINICAL DATA:  Post thoracentesis right EXAM: PORTABLE CHEST 1 VIEW COMPARISON:  05/13/2016 FINDINGS: Interval decrease in right pleural effusion following thoracentesis. No pneumothorax. Prominent interstitial lung markings diffusely most consistent with heart failure and edema.  Bilateral effusions and bibasilar atelectasis. Progression of left lower lobe atelectasis since yesterday. IMPRESSION: No pneumothorax post right thoracentesis Diffuse interstitial edema most likely due to heart failure. Bibasilar atelectasis and effusion with progression of left lower lobe atelectasis since yesterday. Electronically Signed   By: Franchot Gallo M.D.   On: 05/14/2016 16:22    Scheduled Meds: . aspirin  81 mg Oral Daily  . [START ON 05/16/2016] azithromycin  500 mg Oral Daily  . cefTRIAXone (ROCEPHIN)  IV  1 g Intravenous Q24H  . feeding supplement (ENSURE ENLIVE)  237 mL Oral TID BM  . furosemide  80 mg Intravenous BID  . guaiFENesin  600 mg Oral BID  . ipratropium  0.5 mg Nebulization TID  . levalbuterol  1.25 mg Nebulization TID  . nicotine  21 mg Transdermal Daily  . potassium chloride  20 mEq Oral BID  . senna  1 tablet Oral BID  . sodium chloride flush  3 mL Intravenous Q12H  . sodium chloride flush  3 mL Intravenous Q12H    Continuous Infusions:   LOS: 1 day     Annita Brod, MD [facility] Pager [phone]  If 7PM-7AM, please contact night-coverage [email or web address] Password [email or web address] 05/15/2016, 1:55 PM

## 2016-05-15 NOTE — Progress Notes (Signed)
ANTICOAGULATION CONSULT NOTE - Initial Consult  Pharmacy Consult for Heparin Indication: Afib  Allergies  Allergen Reactions  . Demerol [Meperidine] Other (See Comments)    Causes system to shutdown. ?    Patient Measurements: Height: _0  (180.3 cm) Weight: 182 lb 12.2 oz (82.9 kg) IBW/kg (Calculated) : 75.3 Heparin Dosing Weight: 82.9  Vital Signs Temp: 97.6 F (36.4 C) (01/03 1412) Temp Source: Oral (01/03 1412) BP: 101/61 (01/03 1412) Pulse Rate: 91 (01/03 1412)  Labs:  Recent Labs  05/13/16 2259 05/14/16 0037 05/14/16 0347 05/14/16 0604 05/14/16 1247 05/15/16 0911  HGB 13.8  --  12.2*  --   --  13.5  HCT 39.4  --  35.9*  --   --  39.5  PLT 359  --  332  --   --  331  CREATININE 1.29*  --  1.08  --   --  1.12  TROPONINI  --  0.05*  --  0.05* 0.06*  --    Estimated Creatinine Clearance: 63.5 mL/min (by C-G formula based on SCr of 1.12 mg/dL).  Medical History: Past Medical History:  Diagnosis Date  . Arthritis    "neck" (08/15/2015)  . Chronic bronchitis ([place])   . Constipation   . COPD (chronic obstructive pulmonary disease) ([place])   . DDD (degenerative disc disease), cervical   . Edema of left lower extremity   . Facial basal cell cancer   . H/O acne vulgaris 1960s   "led to my discharge from the [facility] in the mid 1960s"  . Headache    history of - left temporal- years ago- not current (08/15/2015)  . Pneumonia 1960s; 2015 X 2  . Prostate cancer ([place]) dx'd early 2000s   "low spreading; non aggressive type" (08/15/2015)  . Skin cancer    "back"   Medications:  Scheduled:  . aspirin  81 mg Oral Daily  . [START ON 05/16/2016] azithromycin  500 mg Oral Daily  . cefTRIAXone (ROCEPHIN)  IV  1 g Intravenous Q24H  . feeding supplement (ENSURE ENLIVE)  237 mL Oral TID BM  . furosemide  80 mg Intravenous BID  . guaiFENesin  600 mg Oral BID  . ipratropium  0.5 mg Nebulization TID  . levalbuterol  1.25 mg Nebulization TID  . nicotine  21 mg Transdermal Daily  .  potassium chloride  20 mEq Oral BID  . senna  1 tablet Oral BID  . sodium chloride flush  3 mL Intravenous Q12H  . sodium chloride flush  3 mL Intravenous Q12H   Infusions:  . heparin     Assessment: 2 yoM diagnosed with CAP x 2 days PTA on 1/1,  prescribed Levaquin as outpt(LD 12/31) now with increased LE swelling. & 2 wk hx of progressive SHOB.Hx of Prostate Ca, COPD  Large R pleural effusion, small L effusion and lung mass seen on CT 1/2  Thoracentesis 1/2 yield of 1.5L  New onset Afib, acute onset CHF  Begin Heparin for Afib with thoracentesis completed. Ongoing workup of malignancy, plan DOAC when all procedures completed  Goal of Therapy:  Heparin level 0.3-0.7 units/ml Monitor platelets by anticoagulation protocol: Yes   Plan:   Discontinue Lovenox 85m daily, last dose 1/3 at 1100  No Heparin load with Lovenox given this am  Heparin infusion: begin at 1300 units/hr  1st Heparin level in 6 hr  Daily cbc, daily heparin level when infusion at steady state  GMinda DittoPharmD Pager [phone] 06111/07/2016, 3:01 PM

## 2016-05-15 NOTE — Progress Notes (Signed)
Brief Pharmacy Note re: Heparin  See complete note from T. Green, PharmD from earlier today  O:  Heparin level = <0.1 on 1300units/hr (goal 0.3-0.7)       No bleeding or infusion issues noted by RN  A/P:  Heparin level sub-therapeutic.  Level not drawn at steady state (~5 hrs after infusion started).  Give 2000 unit IV heparin bolus then increase infusion to 1600 units/hr  Re-check 8 hr heparin level  Netta Cedars, PharmD, BCPS Pager: [phone] 05/15/2016_0 :03 PM

## 2016-05-16 DIAGNOSIS — J9601 Acute respiratory failure with hypoxia: Secondary | ICD-10-CM

## 2016-05-16 DIAGNOSIS — I481 Persistent atrial fibrillation: Secondary | ICD-10-CM

## 2016-05-16 LAB — PROCALCITONIN: Procalcitonin: 0.2 ng/mL

## 2016-05-16 LAB — BASIC METABOLIC PANEL
ANION GAP: 9 (ref 5–15)
BUN: 41 mg/dL — ABNORMAL HIGH (ref 6–20)
CALCIUM: 8.1 mg/dL — AB (ref 8.9–10.3)
CO2: 25 mmol/L (ref 22–32)
Chloride: 101 mmol/L (ref 101–111)
Creatinine, Ser: 0.82 mg/dL (ref 0.61–1.24)
GFR calc Af Amer: >60 mL/min (ref >60)
GFR calc non Af Amer: >60 mL/min (ref >60)
GLUCOSE: 132 mg/dL — AB (ref 65–99)
Potassium: 4.1 mmol/L (ref 3.5–5.1)
Sodium: 135 mmol/L (ref 135–145)

## 2016-05-16 LAB — HEPARIN LEVEL (UNFRACTIONATED)
Heparin Unfractionated: <0.1 IU/mL — ABNORMAL LOW (ref 0.30–0.70)
Heparin Unfractionated: <0.1 IU/mL — ABNORMAL LOW (ref 0.30–0.70)

## 2016-05-16 LAB — MRSA PCR SCREENING: MRSA BY PCR: NEGATIVE

## 2016-05-16 MED ORDER — HEPARIN BOLUS VIA INFUSION
2000.0000 [IU] | Freq: Once | INTRAVENOUS | Status: AC
  Administered 2016-05-16: 2000 [IU] via INTRAVENOUS
  Filled 2016-05-16: qty 2000

## 2016-05-16 MED ORDER — HEPARIN (PORCINE) IN NACL 100-0.45 UNIT/ML-% IJ SOLN
1900.0000 [IU]/h | INTRAMUSCULAR | Status: DC
  Administered 2016-05-16: 1900 [IU]/h via INTRAVENOUS

## 2016-05-16 MED ORDER — FUROSEMIDE 40 MG PO TABS
40.0000 mg | ORAL_TABLET | Freq: Two times a day (BID) | ORAL | Status: DC
  Administered 2016-05-16 – 2016-05-18 (×4): 40 mg via ORAL
  Filled 2016-05-16 (×4): qty 1

## 2016-05-16 MED ORDER — HEPARIN (PORCINE) IN NACL 100-0.45 UNIT/ML-% IJ SOLN
2200.0000 [IU]/h | INTRAMUSCULAR | Status: DC
  Administered 2016-05-16 (×2): 2200 [IU]/h via INTRAVENOUS
  Filled 2016-05-16 (×2): qty 250

## 2016-05-16 NOTE — Progress Notes (Signed)
PROGRESS NOTE  [name] OEH:[id number] DOB: 08/13/1945 DOA: 05/13/2016 PCP: [name]  HPI/Recap of past 18 hours: 71 year old male with past medical history of prostate cancer, tobacco use and COPD admitted for one week of worsening dyspnea and found to be in acute respiratory failure secondary to pneumonia, possible CHF and also in atrial fibrillation. Patient noted to have bilateral pleural effusions although right side significantly large. Patient also noted to have a fair-sized mass on CT scan within the subcarinal region. Patient started on IV antibiotics plus supplemental oxygen. Patient seen by cardiology and started on Lasix for diastolic heart failure plus IV heparin for anticoagulation for A. fib. Seen by pulmonary and patient underwent thoracentesis on 1/2 yielding 1.5 L of fluid. Fluid sent for cytology which is currently pending.  Patient himself feeling much better today. No complaints breathing a lot easier. He states that last night he slept much more solidly that he has a long time.  Assessment/Plan: Active Problems:   Acute respiratory failure with hypoxia secondary to COPD, pneumonia and heart failure: Chronic 2 L nasal cannula to keep oxygen saturation greater than 90%. Breathing much easier following thoracentesis. Wean oxggen    New-onset A-fib [facility]): New diagnosis. Rate controlled.  Echocardiogram lvef wnl, diastolic function not able to assess due to afib.. Chads 2 score 4. On heparin drip now. Will need long-term oral anticoagulation once all tests and workup complete Cardiology input appreciated    Acute diastolic CHF (congestive heart failure) [facility]): Echocardiogram noted enlarged atria, preserved ejection fraction. Started on Lasix. Good urine output, edema down, cardiology input appreciated..    Pleural effusion: Large on right, small on left. In part from heart failure, but also inflammatory. Status post thoracentesis on right side yielding 1.5 L.  Fluid pathology no malignant cells seen, +acute inflammation.    COPD (chronic obstructive pulmonary disease) ([place]): On nebulizers. Plus oxygen and antibiotics, no wheezing on exam.    CAP (community acquired pneumonia): Elevated pro calcitonin level, have started IV antibiotics. Responded well to this.   pro-calcitonin level trending down, leukocytosis trending down, no fever.    Tobacco abuse: Counseled to quit    Mass in chest: Unclear etiology. Suspicious for malignancy. Awaiting cytology from thoracentesis. If this is unrevealing, plans are for bronchoscopy, pulmonary input appreciated    Protein-calorie malnutrition, severe [facility]): Patient meets criteria in the context of chronic illness. Seen by nutrition. Started on ensure 3 times a day   Hyponatremia: Mild secondary to chf?, resolved  Code Status: Full code   Family Communication: Wife at the bedside  Disposition Plan: hopefully able to d/c home on 1/5 with cardiology and pulmonary clearance, wean o2,    Consultants:  Pulmonary /critical care  cardiology  Procedures:  Echocardiogram done 1/2: Enlarged atria, preserved ejection fraction  Thoracentesis done 1/2 : 1.5 L fluid consistent with transudate. No malignant cells by fluids cytology.  Antimicrobials:  IV Rocephin and Zithromax 1/1-present  Prior to admission, patient on Levaquin 12/30-12/31   DVT prophylaxis:  Lovenox, then heparin drip   Objective: Vitals:   05/16/16 0453 05/16/16 0901 05/16/16 1329 05/16/16 1438  BP: (!) 95/56   96/60  Pulse: 93 75 82 92  Resp: _0 Temp: 97.6 F (36.4 C)   98 F (36.7 C)  TempSrc: Oral   Axillary  SpO2: 93% 91% 94% 93%  Weight: 79.3 kg (174 lb 13.2 oz)     Height:  Intake/Output Summary (Last 24 hours) at 05/16/16 1559 Last data filed at 05/16/16 1430  Gross per 24 hour  Intake           854.65 ml  Output             3525 ml  Net         -2670.35 ml   Filed Weights   05/14/16 0245  05/15/16 0500 05/16/16 0453  Weight: 82.9 kg (182 lb 12.2 oz) 82.9 kg (182 lb 12.2 oz) 79.3 kg (174 lb 13.2 oz)    Exam:   General:  Alert and oriented 3, no acute distress   Cardiovascular: Irregular rhythm, rate controlled   Respiratory: Decreased breath sounds bibasilar, no wheezing, no rhonchi, no rales  Abdomen: Soft, nontender, nondistended, positive bowel sounds   Musculoskeletal: 2+ pitting edema bilaterally from the ankles down has improved  Skin: No skin breaks, tears or lesions  Psychiatry: Patient is appropriate, no evidence of psychoses    Data Reviewed: CBC:  Recent Labs Lab 05/13/16 2259 05/14/16 0347 05/15/16 0911  WBC 20.2* 16.8* 12.3*  NEUTROABS 17.0*  --   --   HGB 13.8 12.2* 13.5  HCT 39.4 35.9* 39.5  MCV 87.4 88.4 88.6  PLT 359 332 638   Basic Metabolic Panel:  Recent Labs Lab 05/13/16 2259 05/14/16 0347 05/15/16 0911 05/16/16 0507  NA 130* 131* 133* 135  K 4.4 4.3 3.6 4.1  CL 98* 99* 99* 101  CO2 _0 GLUCOSE 113* 105* 109* 132*  BUN 40* 39* 40* 41*  CREATININE 1.29* 1.08 1.12 0.82  CALCIUM 8.5* 8.1* 8.4* 8.1*  MG 1.8 1.9  --   --   PHOS  --  4.0  --   --    GFR: Estimated Creatinine Clearance: 86.7 mL/min (by C-G formula based on SCr of 0.82 mg/dL). Liver Function Tests:  Recent Labs Lab 05/14/16 0037 05/14/16 0347 05/14/16 1642  AST 38 33  --   ALT 30 30  --   ALKPHOS 218* 219*  --   BILITOT 1.0 1.3*  --   PROT 5.7* 5.8* 6.4*  ALBUMIN 2.1* 2.2*  --    No results for input(s): LIPASE, AMYLASE in the last 168 hours. No results for input(s): AMMONIA in the last 168 hours. Coagulation Profile: No results for input(s): INR, PROTIME in the last 168 hours. Cardiac Enzymes:  Recent Labs Lab 05/14/16 0037 05/14/16 0604 05/14/16 1247  TROPONINI 0.05* 0.05* 0.06*   BNP (last 3 results) No results for input(s): PROBNP in the last 8760 hours. HbA1C: No results for input(s): HGBA1C in the last 72  hours. CBG: No results for input(s): GLUCAP in the last 168 hours. Lipid Profile: No results for input(s): CHOL, HDL, LDLCALC, TRIG, CHOLHDL, LDLDIRECT in the last 72 hours. Thyroid Function Tests:  Recent Labs  05/14/16 0347  TSH 1.187   Anemia Panel: No results for input(s): VITAMINB12, FOLATE, FERRITIN, TIBC, IRON, RETICCTPCT in the last 72 hours. Urine analysis:    Component Value Date/Time   COLORURINE YELLOW 05/14/2016 0032   APPEARANCEUR CLEAR 05/14/2016 0032   LABSPEC 1.011 05/14/2016 0032   PHURINE 5.0 05/14/2016 0032   GLUCOSEU NEGATIVE 05/14/2016 0032   HGBUR NEGATIVE 05/14/2016 0032   BILIRUBINUR NEGATIVE 05/14/2016 0032   KETONESUR NEGATIVE 05/14/2016 0032   PROTEINUR 30 (A) 05/14/2016 0032   NITRITE NEGATIVE 05/14/2016 0032   LEUKOCYTESUR NEGATIVE 05/14/2016 0032   Sepsis Labs: _1 (procalcitonin:4,lacticidven:4)  ) Recent Results (from the  past 240 hour(s))  Blood culture (routine x 2)     Status: None (Preliminary result)   Collection Time: 05/14/16 12:02 AM  Result Value Ref Range Status   Specimen Description BLOOD RIGHT ANTECUBITAL  Final   Special Requests BOTTLES DRAWN AEROBIC AND ANAEROBIC 5ML  Final   Culture   Final    NO GROWTH 1 DAY Performed at [facility]    Report Status PENDING  Incomplete  Blood culture (routine x 2)     Status: None (Preliminary result)   Collection Time: 05/14/16 12:02 AM  Result Value Ref Range Status   Specimen Description BLOOD LEFT ANTECUBITAL  Final   Special Requests BOTTLES DRAWN AEROBIC AND ANAEROBIC 5ML  Final   Culture   Final    NO GROWTH 1 DAY Performed at [facility]    Report Status PENDING  Incomplete  Body fluid culture     Status: None (Preliminary result)   Collection Time: 05/14/16  3:30 PM  Result Value Ref Range Status   Specimen Description PLEURAL  Final   Special Requests NONE  Final   Gram Stain   Final    MODERATE WBC PRESENT, PREDOMINANTLY MONONUCLEAR NO  ORGANISMS SEEN    Culture   Final    NO GROWTH 2 DAYS Performed at [facility]    Report Status PENDING  Incomplete      Studies: No results found.  Scheduled Meds: . aspirin  81 mg Oral Daily  . azithromycin  500 mg Oral Daily  . cefTRIAXone (ROCEPHIN)  IV  1 g Intravenous Q24H  . feeding supplement (ENSURE ENLIVE)  237 mL Oral TID BM  . furosemide  40 mg Oral BID  . guaiFENesin  600 mg Oral BID  . ipratropium  0.5 mg Nebulization TID  . levalbuterol  1.25 mg Nebulization TID  . nicotine  21 mg Transdermal Daily  . potassium chloride  20 mEq Oral BID  . senna  1 tablet Oral BID  . sodium chloride flush  3 mL Intravenous Q12H  . sodium chloride flush  3 mL Intravenous Q12H    Continuous Infusions: . heparin 1,900 Units/hr (05/16/16 0627)     LOS: 2 days   Time spent: 82mns  Debi Cousin, MD PhD [facility] Pager [phone] If 7PM-7AM, please contact night-coverage [email or web address] Password [email or web address] 05/16/2016, 3:59 PM

## 2016-05-16 NOTE — Progress Notes (Signed)
ANTICOAGULATION CONSULT NOTE - Initial Consult  Pharmacy Consult for Heparin Indication: Afib  Allergies  Allergen Reactions  . Demerol [Meperidine] Other (See Comments)    Causes system to shutdown. ?    Patient Measurements: Height: _0  (180.3 cm) Weight: 174 lb 13.2 oz (79.3 kg) IBW/kg (Calculated) : 75.3 Heparin Dosing Weight: 82.9  Vital Signs Temp: 97.6 F (36.4 C) (01/04 0453) Temp Source: Oral (01/04 0453) BP: 95/56 (01/04 0453) Pulse Rate: 93 (01/04 0453)  Labs:  Recent Labs  05/13/16 2259 05/14/16 0037 05/14/16 0347 05/14/16 0604 05/14/16 1247 05/15/16 0911 05/15/16 2049 05/16/16 0507  HGB 13.8  --  12.2*  --   --  13.5  --   --   HCT 39.4  --  35.9*  --   --  39.5  --   --   PLT 359  --  332  --   --  331  --   --   HEPARINUNFRC  --   --   --   --   --   --  <0.10* <0.10*  CREATININE 1.29*  --  1.08  --   --  1.12  --  0.82  TROPONINI  --  0.05*  --  0.05* 0.06*  --   --   --    Estimated Creatinine Clearance: 86.7 mL/min (by C-G formula based on SCr of 0.82 mg/dL).  Medical History: Past Medical History:  Diagnosis Date  . Arthritis    "neck" (08/15/2015)  . Chronic bronchitis ([place])   . Constipation   . COPD (chronic obstructive pulmonary disease) ([place])   . DDD (degenerative disc disease), cervical   . Edema of left lower extremity   . Facial basal cell cancer   . H/O acne vulgaris 1960s   "led to my discharge from the [facility] in the mid 1960s"  . Headache    history of - left temporal- years ago- not current (08/15/2015)  . Pneumonia 1960s; 2015 X 2  . Prostate cancer ([place]) dx'd early 2000s   "low spreading; non aggressive type" (08/15/2015)  . Skin cancer    "back"   Medications:  Scheduled:  . aspirin  81 mg Oral Daily  . azithromycin  500 mg Oral Daily  . cefTRIAXone (ROCEPHIN)  IV  1 g Intravenous Q24H  . feeding supplement (ENSURE ENLIVE)  237 mL Oral TID BM  . furosemide  80 mg Intravenous BID  . guaiFENesin  600 mg Oral BID  .  heparin  2,000 Units Intravenous Once  . ipratropium  0.5 mg Nebulization TID  . levalbuterol  1.25 mg Nebulization TID  . nicotine  21 mg Transdermal Daily  . potassium chloride  20 mEq Oral BID  . senna  1 tablet Oral BID  . sodium chloride flush  3 mL Intravenous Q12H  . sodium chloride flush  3 mL Intravenous Q12H   Infusions:  . heparin     Assessment: 23 yoM diagnosed with CAP x 2 days PTA on 1/1,  prescribed Levaquin as outpt(LD 12/31) now with increased LE swelling. & 2 wk hx of progressive SHOB.Hx of Prostate Ca, COPD  Large R pleural effusion, small L effusion and lung mass seen on CT 1/2  Thoracentesis 1/2 yield of 1.5L  New onset Afib, acute onset CHF Begin Heparin for Afib with thoracentesis completed. Ongoing workup of malignancy, plan DOAC when all procedures completed 1/3 2049 HL=<0.10 Today, 1/4 0507 HL = <0.10, no infusion issues or bleeding per RN (  RN double checking IV site)  Goal of Therapy:  Heparin level 0.3-0.7 units/ml Monitor platelets by anticoagulation protocol: Yes   Plan:   Rebolus with 2000 units of IV heparin x 1  Increase drip to 1900 units/hr  Recheck HL in 8 hours  Daily cbc, daily heparin level when infusion at steady state   Dorrene German 05/16/2016, 6:20 AM

## 2016-05-16 NOTE — Progress Notes (Signed)
Ambulated patient in the hall for a short distance both with and without oxygen. Initially sats were 97% with oxygen.  Removed oxygen.  At rest oxygen sats remained greater than 90%.  However, patient required supplemental oxygen when walking, as sats dropped into the low 80's.

## 2016-05-16 NOTE — Progress Notes (Signed)
[facility]  Subjective: Feels a little better, has not been out of bed much Still somewhat short of breath Still weak  Past Medical History:  Diagnosis Date  . Arthritis    "neck" (08/15/2015)  . Chronic bronchitis ([place])   . Constipation   . COPD (chronic obstructive pulmonary disease) ([name])   . DDD (degenerative disc disease), cervical   . Edema of left lower extremity   . Facial basal cell cancer   . H/O acne vulgaris 1960s   "led to my discharge from the [facility] in the mid 1960s"  . Headache    history of - left temporal- years ago- not current (08/15/2015)  . Pneumonia 1960s; 2015 X 2  . Prostate cancer ([place]) dx'd early 2000s   "low spreading; non aggressive type" (08/15/2015)  . Skin cancer    "back"     Objective: Vitals:   05/15/16 2032 05/15/16 2056 05/16/16 0453 05/16/16 0901  BP:  (!) 106/53 (!) 95/56   Pulse:  92 93 75  Resp:  _0 Temp:  98.1 F (36.7 C) 97.6 F (36.4 C)   TempSrc:  Oral Oral   SpO2: 91% 94% 93% 91%  Weight:   174 lb 13.2 oz (79.3 kg)   Height:       2LNC  Gen: chronically ill appearing HENT: OP clear, TM's clear, neck supple PULM: Diminished R base, normal percussion CV: Irreg irreg, no mgr, trace edema GI: BS+, soft, nontender Derm: no cyanosis or rash Psyche: normal mood and affect   CXR images reviewed, showed effusion R>L  Prelim cytology report: inflammatory cells, no malignancy identified  Impression/plan:  Pleural effusion: parapneumonic, no need for further drainage unless it reaccumulates or his breathing worsens; will repeat 2 view CXR in AM CAP: continue IV antibiotics for 7 days Afib: rate control/heparin per cardiology Mediastinal/chest mass: needs bronchoscopy with EBUS, can't do here due to lack of OR availability, but I note that he is still somewhat weak and hasn't walked around.  Would prefer to see him improve from his pneumonia prior to taking him to the OR.  Rec continue current  management, get out of bed, walk around.  When ready for discharge will schedule outpatient EBUS  Wife updated bedside  Will see again tomorrow  Roselie Awkward, MD [place] [facility] Pager: [phone] Cell: [phone] After 3pm or if no response, call [phone]

## 2016-05-16 NOTE — Progress Notes (Signed)
Patient Name: [name] Date of Encounter: 05/16/2016  Primary Cardiologist: [facility] Problem List     Active Problems:   Hyponatremia   Acute diastolic heart failure (HCC)   Bilateral pleural effusion   COPD (chronic obstructive pulmonary disease) (HCC)   Fluid overload   CAP (community acquired pneumonia)   Tobacco abuse   Atrial fibrillation ([place])   Bilateral lower extremity edema   Mass in chest   Protein-calorie malnutrition, severe ([place])   Acute respiratory failure with hypoxia (HCC)      Subjective   Pt is feeling much better today. He has had a great deal of urine output and his pedal edema has improved. He has a dry mouth. He can now feel his feet better and wants to be up out of bed some today. No chest pain, palpitations or lightheadedness. Shortness of breath is better.  Inpatient Medications    Scheduled Meds: . aspirin  81 mg Oral Daily  . azithromycin  500 mg Oral Daily  . cefTRIAXone (ROCEPHIN)  IV  1 g Intravenous Q24H  . feeding supplement (ENSURE ENLIVE)  237 mL Oral TID BM  . furosemide  80 mg Intravenous BID  . guaiFENesin  600 mg Oral BID  . ipratropium  0.5 mg Nebulization TID  . levalbuterol  1.25 mg Nebulization TID  . nicotine  21 mg Transdermal Daily  . potassium chloride  20 mEq Oral BID  . senna  1 tablet Oral BID  . sodium chloride flush  3 mL Intravenous Q12H  . sodium chloride flush  3 mL Intravenous Q12H   Continuous Infusions: . heparin 1,900 Units/hr (05/16/16 0627)   PRN Meds: sodium chloride, acetaminophen **OR** acetaminophen, HYDROcodone-acetaminophen, levalbuterol, ondansetron **OR** ondansetron (ZOFRAN) IV, polyethylene glycol, sodium chloride, sodium chloride flush   Vital Signs    Vitals:   05/15/16 1412 05/15/16 2032 05/15/16 2056 05/16/16 0453  BP: 101/61  (!) 106/53 (!) 95/56  Pulse: 91  92 93  Resp: _0 Temp: 97.6 F (36.4 C)  98.1 F (36.7 C) 97.6 F (36.4 C)  TempSrc: Oral   Oral Oral  SpO2: 97% 91% 94% 93%  Weight:    174 lb 13.2 oz (79.3 kg)  Height:        Intake/Output Summary (Last 24 hours) at 05/16/16 0719 Last data filed at 05/16/16 0600  Gross per 24 hour  Intake           494.65 ml  Output             2250 ml  Net         -1755.35 ml   Filed Weights   05/14/16 0245 05/15/16 0500 05/16/16 0453  Weight: 182 lb 12.2 oz (82.9 kg) 182 lb 12.2 oz (82.9 kg) 174 lb 13.2 oz (79.3 kg)    Physical Exam    GEN: Well nourished, caucasian male, in no acute distress.  HEENT: Grossly normal.  Neck: Supple, no JVD, carotid bruits, or masses. Cardiac: Irregularly irregular rhythm, no murmurs, rubs, or gallops. Slight clubbing of fingernails, 1+ lower leg edema.  Radials/DP/PT 2+ and equal bilaterally.  Respiratory:  Respirations regular and unlabored, clear to auscultation bilaterally, without wheezes or rales. GI: Soft, nontender, nondistended, BS + x 4. MS: no deformity or atrophy. Skin: warm and dry, no rash. Neuro:  Strength and sensation are intact. Psych: AAOx3.  Normal affect.  Labs    CBC  Recent Labs  05/13/16 2259  05/14/16 0347 05/15/16 0911  WBC 20.2* 16.8* 12.3*  NEUTROABS 17.0*  --   --   HGB 13.8 12.2* 13.5  HCT 39.4 35.9* 39.5  MCV 87.4 88.4 88.6  PLT 359 332 308   Basic Metabolic Panel  Recent Labs  05/13/16 2259 05/14/16 0347 05/15/16 0911 05/16/16 0507  NA 130* 131* 133* 135  K 4.4 4.3 3.6 4.1  CL 98* 99* 99* 101  CO2 _0 GLUCOSE 113* 105* 109* 132*  BUN 40* 39* 40* 41*  CREATININE 1.29* 1.08 1.12 0.82  CALCIUM 8.5* 8.1* 8.4* 8.1*  MG 1.8 1.9  --   --   PHOS  --  4.0  --   --    Liver Function Tests  Recent Labs  05/14/16 0037 05/14/16 0347 05/14/16 1642  AST 38 33  --   ALT 30 30  --   ALKPHOS 218* 219*  --   BILITOT 1.0 1.3*  --   PROT 5.7* 5.8* 6.4*  ALBUMIN 2.1* 2.2*  --    No results for input(s): LIPASE, AMYLASE in the last 72 hours. Cardiac Enzymes  Recent Labs  05/14/16 0037  05/14/16 0604 05/14/16 1247  TROPONINI 0.05* 0.05* 0.06*   BNP Invalid input(s): POCBNP D-Dimer No results for input(s): DDIMER in the last 72 hours. Hemoglobin A1C No results for input(s): HGBA1C in the last 72 hours. Fasting Lipid Panel No results for input(s): CHOL, HDL, LDLCALC, TRIG, CHOLHDL, LDLDIRECT in the last 72 hours. Thyroid Function Tests  Recent Labs  05/14/16 0347  TSH 1.187    Telemetry    Atrial fibrillation in the 80's - Personally Reviewed  ECG    No new tracings  Radiology    Dg Chest Port 1 View  Result Date: 05/14/2016 CLINICAL DATA:  Post thoracentesis right EXAM: PORTABLE CHEST 1 VIEW COMPARISON:  05/13/2016 FINDINGS: Interval decrease in right pleural effusion following thoracentesis. No pneumothorax. Prominent interstitial lung markings diffusely most consistent with heart failure and edema. Bilateral effusions and bibasilar atelectasis. Progression of left lower lobe atelectasis since yesterday. IMPRESSION: No pneumothorax post right thoracentesis Diffuse interstitial edema most likely due to heart failure. Bibasilar atelectasis and effusion with progression of left lower lobe atelectasis since yesterday. Electronically Signed   By: Franchot Gallo M.D.   On: 05/14/2016 16:22    Cardiac Studies   05/14/2016- ECHO Study Conclusions - Left ventricle: The cavity size was normal. Wall thickness was increased in a pattern of mild LVH. Systolic function was vigorous. The estimated ejection fraction was in the range of 65% to 70%. Indeterminant diastolic function (atrial fibrillation). Wall motion was normal; there were no regional wall motion abnormalities. - Ventricular septum: D-shaped interventricular septum, suggesting RV pressure/volume overload. - Aortic valve: There was no stenosis. - Aorta: Mildly dilated aortic root. Aortic root dimension: 39 mm (ED). - Mitral valve: Mildly calcified annulus. There was  trivial regurgitation. - Left atrium: The atrium was moderately to severely dilated. - Right ventricle: The cavity size was moderately dilated. Systolic function was normal. - Right atrium: The atrium was moderately to severely dilated. Chiari network noted. - Tricuspid valve: Peak RV-RA gradient (S): 28 mm Hg. - Pulmonary arteries: PA peak pressure: 43 mm Hg (S). - Systemic veins: IVC measured 2.5 cm with < 50% respirophasic variation, suggesting RA pressure 15 mmHg. - Pericardium, extracardiac: A trivial pericardial effusion was identified.  Impressions: - The patient was in atrial fibrillation. Normal LV size with mild LV hypertrophy. EF 65-70%.  D-shaped interventricular septum suggestive of RV pressure/volume overload. Moderately dilated RV with normal systolic function. Dilated IVC suggestive of elevated RV filling pressure. Mild pulmonary hypertension.  Patient Profile     Mr. Lamboy is a 71 yo male patient with past medical history of COPD, Smoking, prostate cancer, Pneumonia 1 year ago and inguinal hernia S/P repair who presented  on 05/13/2016 with a 2 week history of progressive shortness of breath treated as outpt with Levaquin and no improvement. He has no known cardiac history and no previous cardiac studies or procedures. On admit, Chest CT showed no PE but large right pleural effusion with PNA. BNP elevated at 270 with mildly elevated trop and new on set afib rate controlled of unknown duration.   Assessment & Plan    1. Atrial fibrillation -This is a new diagnosis, but with controlled rate. It is of unknown duration. This may be a stress reaction to his pulmonary issues or may have been present for some time as there is no previous EKG for comparison and the patient states that no EKG was done at his last physical -CHA2DS2-VASc score is 2 for age and diastolic CHF. -Anticoagulation had been deferred due to pleural effusions and need for thoracentesis  but now s/p thoracentesis.  He may need bx for lung mass so would consider adding DOAC only once all procedures have been completed.  For now IV Heparin gtt  -Rate is well-controlled with rates in the 70's- 90s on telemetry and beta blocker or calcium channel blocker has been deferred at this time due to soft blood pressure with systolic blood pressure 68-915'P, asymptomatic, SBP 1/3-1/4 95-106 -Can use metorprolol if needed for rate control  2. Diastolic Heart failure with elevated RV pressure -Admitted with Pedal edema, shortness of breath, mild orthopnea and JVD  -Echocardiogram 05/14/16 showed Normal LV size with mild LV hypertrophy. EF 65-70%. D-shaped interventricular septum suggestive of RV pressure/volume overload. Moderately dilated RV with normal systolic function. Dilated IVC suggestive of elevated RV filling pressure. Mild pulmonary hypertension. -Patient has a history of long time smoker -Lasix increased to 47m IV twice a day - given volume overload on both exam and echo and no significant change in weight. Supplemental potassium added -Will follow renal function with BMP, K+ 4.1 and cr 0.82 on 1/4 -Weight is down 8 pounds in last 24 hours (1/4), 6 pounds for this admission -I&O is -1755 for the last 24 hours (1/4) -Responded well to lasix 80 mg IV, Breathing is better per patient and pedal edema has decreased from 2+ to 1+ and sensation in the feet has improved -Will continue to diurese and monitor labs  3. Elevated troponins -Troponins are 0.05, 0.05, 0.06 mildly elevated with a flat pattern and this is likely related to demand ischemia insetting of new onset atrial fibrillation and possible pneumonia  4. Shortness of breath -COPD -Nebulizers as needed per internal medicine -Smoking cessation advised -Leukocytosis being evaluated by internal medicine for possible pneumonia, antibiotics in process -CT scan ruled out PE but did show Large right-sided pleural effusion and small  left-sided pleural effusion. Dense consolidation in the right lower lobe suspicious for a pneumonia and a mass within the subcarinal region and moderate mediastinal adenopathy -05/14/2016 Right thoracentesis 1500 ml of cloudy fluid sent for pathology, Mesothelial cells noted. WBC's 7,920  Signed, JDaune Perch NP  05/16/2016, 7:20 AM  Pager: [phone]

## 2016-05-16 NOTE — Progress Notes (Signed)
ANTICOAGULATION CONSULT NOTE - Follow Up  Pharmacy Consult for Heparin Indication: Afib  Allergies  Allergen Reactions  . Demerol [Meperidine] Other (See Comments)    Causes system to shutdown. ?    Patient Measurements: Height: _0  (180.3 cm) Weight: 174 lb 13.2 oz (79.3 kg) IBW/kg (Calculated) : 75.3 Heparin Dosing Weight: 82.9  Vital Signs Temp: 98 F (36.7 C) (01/04 1438) Temp Source: Axillary (01/04 1438) BP: 96/60 (01/04 1438) Pulse Rate: 92 (01/04 1438)  Labs:  Recent Labs  05/13/16 2259 05/14/16 0037 05/14/16 0347 05/14/16 0604 05/14/16 1247 05/15/16 0911 05/15/16 2049 05/16/16 0507 05/16/16 1423  HGB 13.8  --  12.2*  --   --  13.5  --   --   --   HCT 39.4  --  35.9*  --   --  39.5  --   --   --   PLT 359  --  332  --   --  331  --   --   --   HEPARINUNFRC  --   --   --   --   --   --  <0.10* <0.10* <0.10*  CREATININE 1.29*  --  1.08  --   --  1.12  --  0.82  --   TROPONINI  --  0.05*  --  0.05* 0.06*  --   --   --   --    Estimated Creatinine Clearance: 86.7 mL/min (by C-G formula based on SCr of 0.82 mg/dL).  Medical History: Past Medical History:  Diagnosis Date  . Arthritis    "neck" (08/15/2015)  . Chronic bronchitis ([place])   . Constipation   . COPD (chronic obstructive pulmonary disease) ([place])   . DDD (degenerative disc disease), cervical   . Edema of left lower extremity   . Facial basal cell cancer   . H/O acne vulgaris 1960s   "led to my discharge from the [facility] in the mid 1960s"  . Headache    history of - left temporal- years ago- not current (08/15/2015)  . Pneumonia 1960s; 2015 X 2  . Prostate cancer ([place]) dx'd early 2000s   "low spreading; non aggressive type" (08/15/2015)  . Skin cancer    "back"   Medications:  Scheduled:  . aspirin  81 mg Oral Daily  . azithromycin  500 mg Oral Daily  . cefTRIAXone (ROCEPHIN)  IV  1 g Intravenous Q24H  . feeding supplement (ENSURE ENLIVE)  237 mL Oral TID BM  . furosemide  40 mg Oral BID  .  guaiFENesin  600 mg Oral BID  . ipratropium  0.5 mg Nebulization TID  . levalbuterol  1.25 mg Nebulization TID  . nicotine  21 mg Transdermal Daily  . potassium chloride  20 mEq Oral BID  . senna  1 tablet Oral BID  . sodium chloride flush  3 mL Intravenous Q12H  . sodium chloride flush  3 mL Intravenous Q12H   Infusions:  . heparin 1,900 Units/hr (05/16/16 1683)   Assessment: 12 yoM diagnosed with CAP x 2 days PTA on 1/1,  prescribed Levaquin as outpt(LD 12/31) now with increased LE swelling. & 2 wk hx of progressive SHOB.Hx of Prostate Ca, COPD  Large R pleural effusion, small L effusion and lung mass seen on CT 1/2  Thoracentesis 1/2 yield of 1.5L  New onset Afib, acute onset CHF Begin Heparin for Afib with thoracentesis completed. Ongoing workup of malignancy, plan DOAC when all procedures completed 1/3 2049 HL=<0.10  Today, 1/4, PM  Heparin level still subtherapeutic despite rebolus and increase in heparin rate early this AM  Per RN, no reported bleeding and no problems with heparin IV site, etc  Goal of Therapy:  Heparin level 0.3-0.7 units/ml Monitor platelets by anticoagulation protocol: Yes   Plan:   Rebolus with 2000 units of IV heparin x 1  Increase drip to 2200 units/hr  Recheck HL in 8 hours  Daily cbc, daily heparin level when infusion at steady state   Adrian Saran, PharmD, BCPS Pager [phone] 05/16/2016 4:56 PM

## 2016-05-17 ENCOUNTER — Inpatient Hospital Stay (HOSPITAL_COMMUNITY): Payer: Medicare Other

## 2016-05-17 DIAGNOSIS — Z9889 Other specified postprocedural states: Secondary | ICD-10-CM

## 2016-05-17 LAB — BASIC METABOLIC PANEL
Anion gap: 7 (ref 5–15)
BUN: 37 mg/dL — AB (ref 6–20)
CHLORIDE: 101 mmol/L (ref 101–111)
CO2: 29 mmol/L (ref 22–32)
Calcium: 8.2 mg/dL — ABNORMAL LOW (ref 8.9–10.3)
Creatinine, Ser: 0.9 mg/dL (ref 0.61–1.24)
Glucose, Bld: 118 mg/dL — ABNORMAL HIGH (ref 65–99)
POTASSIUM: 4.6 mmol/L (ref 3.5–5.1)
SODIUM: 137 mmol/L (ref 135–145)

## 2016-05-17 LAB — LACTATE DEHYDROGENASE, PLEURAL OR PERITONEAL FLUID: LD, Fluid: 970 U/L — ABNORMAL HIGH (ref 3–23)

## 2016-05-17 LAB — CBC
HEMATOCRIT: 34.8 % — AB (ref 39.0–52.0)
Hemoglobin: 11.6 g/dL — ABNORMAL LOW (ref 13.0–17.0)
MCH: 29.8 pg (ref 26.0–34.0)
MCHC: 33.3 g/dL (ref 30.0–36.0)
MCV: 89.5 fL (ref 78.0–100.0)
PLATELETS: 310 10*3/uL (ref 150–400)
RBC: 3.89 MIL/uL — AB (ref 4.22–5.81)
RDW: 15.2 % (ref 11.5–15.5)
WBC: 10.4 10*3/uL (ref 4.0–10.5)

## 2016-05-17 LAB — HEPATIC FUNCTION PANEL
ALBUMIN: 1.9 g/dL — AB (ref 3.5–5.0)
ALT: 34 U/L (ref 17–63)
AST: 36 U/L (ref 15–41)
Alkaline Phosphatase: 251 U/L — ABNORMAL HIGH (ref 38–126)
BILIRUBIN DIRECT: 0.1 mg/dL (ref 0.1–0.5)
BILIRUBIN TOTAL: 0.3 mg/dL (ref 0.3–1.2)
Indirect Bilirubin: 0.2 mg/dL — ABNORMAL LOW (ref 0.3–0.9)
Total Protein: 4.9 g/dL — ABNORMAL LOW (ref 6.5–8.1)

## 2016-05-17 LAB — PROTEIN, BODY FLUID

## 2016-05-17 LAB — HEPARIN LEVEL (UNFRACTIONATED)

## 2016-05-17 LAB — GLUCOSE, CAPILLARY: GLUCOSE-CAPILLARY: 124 mg/dL — AB (ref 65–99)

## 2016-05-17 LAB — MAGNESIUM: Magnesium: 1.7 mg/dL (ref 1.7–2.4)

## 2016-05-17 MED ORDER — FLUTICASONE PROPIONATE 50 MCG/ACT NA SUSP
1.0000 | Freq: Every day | NASAL | Status: DC
  Administered 2016-05-17 – 2016-05-30 (×11): 1 via NASAL
  Filled 2016-05-17 (×2): qty 16

## 2016-05-17 MED ORDER — HEPARIN BOLUS VIA INFUSION
2000.0000 [IU] | Freq: Once | INTRAVENOUS | Status: AC
  Administered 2016-05-17: 2000 [IU] via INTRAVENOUS
  Filled 2016-05-17: qty 2000

## 2016-05-17 MED ORDER — BIOTENE DRY MOUTH MT LIQD: 15.0000 mL | OROMUCOSAL | Status: DC | PRN

## 2016-05-17 MED ORDER — HEPARIN (PORCINE) IN NACL 100-0.45 UNIT/ML-% IJ SOLN
2400.0000 [IU]/h | INTRAMUSCULAR | Status: DC
  Administered 2016-05-17 (×2): 2400 [IU]/h via INTRAVENOUS
  Filled 2016-05-17: qty 250

## 2016-05-17 MED ORDER — TAMSULOSIN HCL 0.4 MG PO CAPS
0.4000 mg | ORAL_CAPSULE | Freq: Every day | ORAL | Status: DC
Start: 2016-05-18 — End: 2016-05-31
  Administered 2016-05-18 – 2016-05-31 (×13): 0.4 mg via ORAL
  Filled 2016-05-17 (×13): qty 1

## 2016-05-17 NOTE — Progress Notes (Signed)
ANTICOAGULATION CONSULT NOTE - Follow Up  Pharmacy Consult for Heparin > Lovenox > discontinue anticoagulation Indication: Afib  Allergies  Allergen Reactions  . Demerol [Meperidine] Other (See Comments)    Causes system to shutdown. ?    Patient Measurements: Height: _0  (180.3 cm) Weight: 174 lb 13.2 oz (79.3 kg) IBW/kg (Calculated) : 75.3 Heparin Dosing Weight: 82.9  Vital Signs Temp: 98.5 F (36.9 C) (01/05 0650) Temp Source: Oral (01/05 0650) BP: 106/59 (01/05 0650) Pulse Rate: 85 (01/05 0650)  Labs:  Recent Labs  05/15/16 0911  05/16/16 0507 05/16/16 1423 05/17/16 0136  HGB 13.5  --   --   --  11.6*  HCT 39.5  --   --   --  34.8*  PLT 331  --   --   --  310  HEPARINUNFRC  --   < > <0.10* <0.10* <0.10*  CREATININE 1.12  --  0.82  --  0.90  < > = values in this interval not displayed. Estimated Creatinine Clearance: 79 mL/min (by C-G formula based on SCr of 0.9 mg/dL).  Medical History: Past Medical History:  Diagnosis Date  . Arthritis    "neck" (08/15/2015)  . Chronic bronchitis ([place])   . Constipation   . COPD (chronic obstructive pulmonary disease) ([place])   . DDD (degenerative disc disease), cervical   . Edema of left lower extremity   . Facial basal cell cancer   . H/O acne vulgaris 1960s   "led to my discharge from the [facility] in the mid 1960s"  . Headache    history of - left temporal- years ago- not current (08/15/2015)  . Pneumonia 1960s; 2015 X 2  . Prostate cancer ([place]) dx'd early 2000s   "low spreading; non aggressive type" (08/15/2015)  . Skin cancer    "back"   Medications:  Scheduled:  . aspirin  81 mg Oral Daily  . azithromycin  500 mg Oral Daily  . cefTRIAXone (ROCEPHIN)  IV  1 g Intravenous Q24H  . feeding supplement (ENSURE ENLIVE)  237 mL Oral TID BM  . fluticasone  1 spray Each Nare Daily  . furosemide  40 mg Oral BID  . guaiFENesin  600 mg Oral BID  . ipratropium  0.5 mg Nebulization TID  . levalbuterol  1.25 mg Nebulization TID   . nicotine  21 mg Transdermal Daily  . potassium chloride  20 mEq Oral BID  . senna  1 tablet Oral BID  . sodium chloride flush  3 mL Intravenous Q12H  . sodium chloride flush  3 mL Intravenous Q12H  . [START ON 05/18/2016] tamsulosin  0.4 mg Oral QPC breakfast   Infusions:  . heparin 2,400 Units/hr (05/17/16 0726)   Assessment: 4 yoM diagnosed with CAP x 2 days PTA on 1/1,  prescribed Levaquin as outpt(LD 12/31) now with increased LE swelling. & 2 wk hx of progressive SHOB.Hx of Prostate Ca, COPD  Large R pleural effusion, small L effusion and lung mass seen on CT 1/2  Thoracentesis 1/2 yield of 1.5L  New onset Afib, acute onset CHF Begin Heparin for Afib with thoracentesis completed. Ongoing workup of malignancy, plan DOAC when all procedures completed  Today, 05/17/2016  Heparin levels subtherapeutic despite boluses and increasing heparin rates  Per RN, no reported bleeding and no problems with heparin, IV site, etc  Planned to change anticoagulation to Lovenox after repeat thoracentesis today  Thoracentesis 1/2 yield 1.5L, repeated today - 125 ml loculated.  Goal of Therapy:  Plan:   TCTS consult, likely need VATS  Hold anti-coagulation for now  Follow daily   Plan for DOAC after procedures completed  Minda Ditto PharmD Pager [phone] 05/17/2016, 1:45 PM

## 2016-05-17 NOTE — Progress Notes (Addendum)
PROGRESS NOTE  [name] YFV:[id number] DOB: [birth year] DOA: 05/13/2016 PCP: [name]  HPI/Recap of past 79 hours: 71 year old male with past medical history of prostate cancer, tobacco use and COPD admitted for one week of worsening dyspnea and found to be in acute respiratory failure secondary to pneumonia, possible CHF and also in atrial fibrillation. Patient noted to have bilateral pleural effusions although right side significantly large. Patient also noted to have a fair-sized mass on CT scan within the subcarinal region. Patient started on IV antibiotics plus supplemental oxygen. Patient seen by cardiology and started on Lasix for diastolic heart failure plus IV heparin for anticoagulation for A. fib. Seen by pulmonary and patient underwent thoracentesis on 1/2 yielding 1.5 L of fluid. Fluid sent for cytology which is currently pending.  Patient himself feeling much better today. No complaints breathing a lot easier. He states that last night he slept much more solidly that he has a long time.  Assessment/Plan:    Acute respiratory failure with hypoxia secondary to pneumonia and heart failure:  s/p thoracentesis on 1/2 with 1.5 liter removal. Continued on lasix and abx Not able to Wean off oxggen, o2 dropped to low 80's while ambulating on room air.  Repeat cxr on 1/5 with increased pleural effusion. Repeat thoracentesis on 1/5 per pulmonary. Will need home o2. 1/5 pm addendum: patient's pleural effusion now become loculated, pulmonary consulted CT surgery. Will hold all anticoagulation for now for anticipated thoracic surgical procedure, will discuss with thoracic surgery about choice on anticoagulation post op.   Pleural effusion: Large on right, small on left. In part from heart failure, but also inflammatory from pna.  Status post thoracentesis on right side yielding 1.5 L. Fluid pathology no malignant cells seen, +acute inflammation. Culture no growth, gram stain  negative for organisms.  Repeat cxr on 1/5 with increased pleural effusion. Repeat thoracentesis per pulmonary. Serum albumin 1.9, on nutrition supplement.  CAP (community acquired pneumonia):  On IV antibiotics. pro-calcitonin level trending down, leukocytosis normalized on 1/5, no fever. No cough, sputum sample not collected. mrsa screen negative. Blood culture negative.  Mass in chest: Unclear etiology. Suspicious for malignancy. cytology no malignant cells from thoracentesis. EBUS per pulmonary   Acute diastolic CHF (congestive heart failure) [facility]): Echocardiogram noted enlarged atria, preserved ejection fraction. Started on Lasix. Nutrition supplement. Good urine output, edema down, cardiology consulted and signed off on 1/4.     New-onset A-fib [facility]): New diagnosis. Rate controlled.  Echocardiogram lvef wnl, diastolic function not able to assess due to afib.. Chads 2 score 4.  Will need long-term oral anticoagulation once all tests and workup complete, he has been on heparin drip in anticipating procedures. I discussed with pulmonology Dr [place] Bells on 1/5 am , he agreed to change to lovenox for now and hold lovenox 24hrs prior to any pulmonary procedure. Cardiology consulted and signed off on 1/4.    1/5 pm addendum: patient's pleural effusion now become loculated, pulmonary consulted CT surgery. Will hold all anticoagulation for now for anticipated thoracic surgical procedure, will discuss with thoracic surgery about choice on anticoagulation post op.  COPD (chronic obstructive pulmonary disease) ([place]): On nebulizers. Plus oxygen and antibiotics, no wheezing on exam.   Tobacco abuse: Counseled to quit  Protein-calorie malnutrition, severe [facility]): Patient meets criteria in the context of chronic illness. Seen by nutrition. Started on ensure 3 times a day   Hyponatremia: Mild, secondary to chf? Or pna.  Resolved  BPH, h/o low grade  prostate cancer under active surveillance by urology.  Start flomax.   Code Status: Full code   Family Communication: Wife at the bedside  Disposition Plan:  Pending, not able to  wean off o2, pleural effusion re accumulating    Consultants:  Pulmonary /critical care  Cardiology  Thoracic surgery  Procedures:  Echocardiogram done 1/2: Enlarged atria, preserved ejection fraction  Thoracentesis done 1/2 : 1.5 L fluid consistent with transudate. No malignant cells by fluids cytology.  Antimicrobials:  IV Rocephin and Zithromax 1/1-present  Prior to admission, patient on Levaquin 12/30-12/31   DVT prophylaxis:  Lovenox, then heparin drip, then lovenox on 1/5   Objective: Vitals:   05/16/16 2041 05/16/16 2124 05/17/16 0650 05/17/16 0855  BP:  (!) 103/58 (!) 106/59   Pulse:  86 85   Resp:  19 (!) 21   Temp:  98.4 F (36.9 C) 98.5 F (36.9 C)   TempSrc:  Oral Oral   SpO2: 95% 92% 93% 93%  Weight:      Height:        Intake/Output Summary (Last 24 hours) at 05/17/16 1155 Last data filed at 05/17/16 0650  Gross per 24 hour  Intake           315.57 ml  Output              775 ml  Net          -459.43 ml   Filed Weights   05/14/16 0245 05/15/16 0500 05/16/16 0453  Weight: 82.9 kg (182 lb 12.2 oz) 82.9 kg (182 lb 12.2 oz) 79.3 kg (174 lb 13.2 oz)    Exam:   General:  Alert and oriented 3, no acute distress   Cardiovascular: Irregular rhythm, rate controlled   Respiratory: Decreased breath sounds bibasilar, no wheezing, no rhonchi, no rales  Abdomen: Soft, nontender, nondistended, positive bowel sounds   Musculoskeletal: 2+ pitting edema bilaterally from the ankles down has improved  Skin: No skin breaks, tears or lesions  Psychiatry: Patient is appropriate, no evidence of psychoses    Data Reviewed: CBC:  Recent Labs Lab 05/13/16 2259 05/14/16 0347 05/15/16 0911 05/17/16 0136  WBC 20.2* 16.8* 12.3* 10.4  NEUTROABS 17.0*  --   --   --   HGB 13.8 12.2* 13.5 11.6*  HCT 39.4 35.9* 39.5 34.8*    MCV 87.4 88.4 88.6 89.5  PLT 359 332 331 208   Basic Metabolic Panel:  Recent Labs Lab 05/13/16 2259 05/14/16 0347 05/15/16 0911 05/16/16 0507 05/17/16 0136  NA 130* 131* 133* 135 137  K 4.4 4.3 3.6 4.1 4.6  CL 98* 99* 99* 101 101  CO2 _0 GLUCOSE 113* 105* 109* 132* 118*  BUN 40* 39* 40* 41* 37*  CREATININE 1.29* 1.08 1.12 0.82 0.90  CALCIUM 8.5* 8.1* 8.4* 8.1* 8.2*  MG 1.8 1.9  --   --  1.7  PHOS  --  4.0  --   --   --    GFR: Estimated Creatinine Clearance: 79 mL/min (by C-G formula based on SCr of 0.9 mg/dL). Liver Function Tests:  Recent Labs Lab 05/14/16 0037 05/14/16 0347 05/14/16 1642 05/17/16 0136  AST 38 33  --  36  ALT 30 30  --  34  ALKPHOS 218* 219*  --  251*  BILITOT 1.0 1.3*  --  0.3  PROT 5.7* 5.8* 6.4* 4.9*  ALBUMIN 2.1* 2.2*  --  1.9*   No results for input(s): LIPASE,  AMYLASE in the last 168 hours. No results for input(s): AMMONIA in the last 168 hours. Coagulation Profile: No results for input(s): INR, PROTIME in the last 168 hours. Cardiac Enzymes:  Recent Labs Lab 05/14/16 0037 05/14/16 0604 05/14/16 1247  TROPONINI 0.05* 0.05* 0.06*   BNP (last 3 results) No results for input(s): PROBNP in the last 8760 hours. HbA1C: No results for input(s): HGBA1C in the last 72 hours. CBG: No results for input(s): GLUCAP in the last 168 hours. Lipid Profile: No results for input(s): CHOL, HDL, LDLCALC, TRIG, CHOLHDL, LDLDIRECT in the last 72 hours. Thyroid Function Tests: No results for input(s): TSH, T4TOTAL, FREET4, T3FREE, THYROIDAB in the last 72 hours. Anemia Panel: No results for input(s): VITAMINB12, FOLATE, FERRITIN, TIBC, IRON, RETICCTPCT in the last 72 hours. Urine analysis:    Component Value Date/Time   COLORURINE YELLOW 05/14/2016 0032   APPEARANCEUR CLEAR 05/14/2016 0032   LABSPEC 1.011 05/14/2016 0032   PHURINE 5.0 05/14/2016 0032   GLUCOSEU NEGATIVE 05/14/2016 0032   HGBUR NEGATIVE 05/14/2016 0032    BILIRUBINUR NEGATIVE 05/14/2016 0032   KETONESUR NEGATIVE 05/14/2016 0032   PROTEINUR 30 (A) 05/14/2016 0032   NITRITE NEGATIVE 05/14/2016 0032   LEUKOCYTESUR NEGATIVE 05/14/2016 0032   Sepsis Labs: _0 (procalcitonin:4,lacticidven:4)  ) Recent Results (from the past 240 hour(s))  Blood culture (routine x 2)     Status: None (Preliminary result)   Collection Time: 05/14/16 12:02 AM  Result Value Ref Range Status   Specimen Description BLOOD RIGHT ANTECUBITAL  Final   Special Requests BOTTLES DRAWN AEROBIC AND ANAEROBIC 5ML  Final   Culture   Final    NO GROWTH 2 DAYS Performed at [facility]    Report Status PENDING  Incomplete  Blood culture (routine x 2)     Status: None (Preliminary result)   Collection Time: 05/14/16 12:02 AM  Result Value Ref Range Status   Specimen Description BLOOD LEFT ANTECUBITAL  Final   Special Requests BOTTLES DRAWN AEROBIC AND ANAEROBIC 5ML  Final   Culture   Final    NO GROWTH 2 DAYS Performed at [facility]    Report Status PENDING  Incomplete  Body fluid culture     Status: None (Preliminary result)   Collection Time: 05/14/16  3:30 PM  Result Value Ref Range Status   Specimen Description PLEURAL  Final   Special Requests NONE  Final   Gram Stain   Final    MODERATE WBC PRESENT, PREDOMINANTLY MONONUCLEAR NO ORGANISMS SEEN    Culture   Final    NO GROWTH 2 DAYS Performed at [facility]    Report Status PENDING  Incomplete  MRSA PCR Screening     Status: None   Collection Time: 05/16/16  4:33 PM  Result Value Ref Range Status   MRSA by PCR NEGATIVE NEGATIVE Final    Comment:        The GeneXpert MRSA Assay (FDA approved for NASAL specimens only), is one component of a comprehensive MRSA colonization surveillance program. It is not intended to diagnose MRSA infection nor to guide or monitor treatment for MRSA infections.       Studies: Dg Chest 2 View  Result Date: 05/17/2016 CLINICAL DATA:   Shortness of breath. EXAM: CHEST  2 VIEW COMPARISON:  Radiographs of May 14, 2016. FINDINGS: Stable cardiomegaly. Continued presence of bilateral diffuse interstitial densities are noted concerning for pulmonary edema. Stable mild left pleural effusion is noted. Right pleural effusion is significantly  increased compared to prior exam. No pneumothorax is noted. Bony thorax is unremarkable. IMPRESSION: Cardiomegaly and bilateral pulmonary edema with increased right pleural effusion consistent with congestive heart failure. Electronically Signed   By: Marijo Conception, M.D.   On: 05/17/2016 08:46    Scheduled Meds: . aspirin  81 mg Oral Daily  . azithromycin  500 mg Oral Daily  . cefTRIAXone (ROCEPHIN)  IV  1 g Intravenous Q24H  . feeding supplement (ENSURE ENLIVE)  237 mL Oral TID BM  . furosemide  40 mg Oral BID  . guaiFENesin  600 mg Oral BID  . ipratropium  0.5 mg Nebulization TID  . levalbuterol  1.25 mg Nebulization TID  . nicotine  21 mg Transdermal Daily  . potassium chloride  20 mEq Oral BID  . senna  1 tablet Oral BID  . sodium chloride flush  3 mL Intravenous Q12H  . sodium chloride flush  3 mL Intravenous Q12H    Continuous Infusions: . heparin 2,400 Units/hr (05/17/16 0726)     LOS: 3 days   Time spent: 2mns  Karilyn Wind, MD PhD [facility] Pager [phone] If 7PM-7AM, please contact night-coverage [email or web address] Password [email or web address] 05/17/2016, 11:55 AM

## 2016-05-17 NOTE — Progress Notes (Signed)
ANTICOAGULATION CONSULT NOTE - Follow Up  Pharmacy Consult for Heparin Indication: Afib  Allergies  Allergen Reactions  . Demerol [Meperidine] Other (See Comments)    Causes system to shutdown. ?    Patient Measurements: Height: _0  (180.3 cm) Weight: 174 lb 13.2 oz (79.3 kg) IBW/kg (Calculated) : 75.3 Heparin Dosing Weight: 82.9  Vital Signs Temp: 98.4 F (36.9 C) (01/04 2124) Temp Source: Oral (01/04 2124) BP: 103/58 (01/04 2124) Pulse Rate: 86 (01/04 2124)  Labs:  Recent Labs  05/14/16 0347 05/14/16 0604 05/14/16 1247 05/15/16 0911  05/16/16 0507 05/16/16 1423 05/17/16 0136  HGB 12.2*  --   --  13.5  --   --   --  11.6*  HCT 35.9*  --   --  39.5  --   --   --  34.8*  PLT 332  --   --  331  --   --   --  310  HEPARINUNFRC  --   --   --   --   < > <0.10* <0.10* <0.10*  CREATININE 1.08  --   --  1.12  --  0.82  --  0.90  TROPONINI  --  0.05* 0.06*  --   --   --   --   --   < > = values in this interval not displayed. Estimated Creatinine Clearance: 79 mL/min (by C-G formula based on SCr of 0.9 mg/dL).  Medical History: Past Medical History:  Diagnosis Date  . Arthritis    "neck" (08/15/2015)  . Chronic bronchitis ([place])   . Constipation   . COPD (chronic obstructive pulmonary disease) ([place])   . DDD (degenerative disc disease), cervical   . Edema of left lower extremity   . Facial basal cell cancer   . H/O acne vulgaris 1960s   "led to my discharge from the [facility] in the mid 1960s"  . Headache    history of - left temporal- years ago- not current (08/15/2015)  . Pneumonia 1960s; 2015 X 2  . Prostate cancer ([place]) dx'd early 2000s   "low spreading; non aggressive type" (08/15/2015)  . Skin cancer    "back"   Medications:  Scheduled:  . aspirin  81 mg Oral Daily  . azithromycin  500 mg Oral Daily  . cefTRIAXone (ROCEPHIN)  IV  1 g Intravenous Q24H  . feeding supplement (ENSURE ENLIVE)  237 mL Oral TID BM  . furosemide  40 mg Oral BID  . guaiFENesin  600 mg  Oral BID  . heparin  2,000 Units Intravenous Once  . ipratropium  0.5 mg Nebulization TID  . levalbuterol  1.25 mg Nebulization TID  . nicotine  21 mg Transdermal Daily  . potassium chloride  20 mEq Oral BID  . senna  1 tablet Oral BID  . sodium chloride flush  3 mL Intravenous Q12H  . sodium chloride flush  3 mL Intravenous Q12H   Infusions:  . heparin     Assessment: 5 yoM diagnosed with CAP x 2 days PTA on 1/1,  prescribed Levaquin as outpt(LD 12/31) now with increased LE swelling. & 2 wk hx of progressive SHOB.Hx of Prostate Ca, COPD  Large R pleural effusion, small L effusion and lung mass seen on CT 1/2  Thoracentesis 1/2 yield of 1.5L  New onset Afib, acute onset CHF Begin Heparin for Afib with thoracentesis completed. Ongoing workup of malignancy, plan DOAC when all procedures completed 1/3 2049 HL=<0.10  1/4  Heparin level still  subtherapeutic despite rebolus and increase in heparin rate early this AM  Per RN, no reported bleeding and no problems with heparin IV site, etc  Today, 1/5  0136 HL=<0.10 (x4 levels subtherapeutic!!!) RN checking IV site and no bleeding noted.   Goal of Therapy:  Heparin level 0.3-0.7 units/ml Monitor platelets by anticoagulation protocol: Yes   Plan:   Rebolus with 2000 units of IV heparin x 1  Increase drip to 2400 units/hr  Recheck HL in 8 hours  Daily cbc, daily heparin level when infusion at steady state  Dorrene German 05/17/2016 3:16 AM

## 2016-05-17 NOTE — Procedures (Signed)
Thoracentesis Procedure Note  Pre-operative Diagnosis: right pleural effusion   Post-operative Diagnosis: complicated right pleural effusion   Indications: evaluation & evacuation of pleural fluid   Procedure Details  Consent: Informed consent was obtained. Risks of the procedure were discussed including: infection, bleeding, pain, pneumothorax.  Under sterile conditions the patient was positioned. Betadine solution and sterile drapes were utilized.  1% buffered lidocaine was used to anesthetize the  rib space which was evaluated via real-time [place]. Fluid was obtained without any difficulties and minimal blood loss.  A dressing was applied to the wound and wound care instructions were provided.   Findings 125 ml of cloudy pleural fluid was obtained. A sample was sent for infection analysis. Unfortunately was significant amount of residual pleural fluid w/ notable fibrinous cavitations t/o the right chest and at least one other large fluid pocket.    Complications:  None; patient tolerated the procedure well.          Condition: stable  Plan A follow up chest x-ray was ordered. I spoke w/ Dr Prescott Gum will get CT chest. He will see him this afternoon.  Will likely need VATs Medical team updated.   [name] ACNP-BC [place] Pager # [phone] OR # [phone] if no answer

## 2016-05-17 NOTE — Progress Notes (Signed)
Patient ambulated this morning with 02 at 2l. Via [place]. Tolerated well. Without oxygen, patient 02 sat was in the  low 80s.

## 2016-05-17 NOTE — Evaluation (Signed)
Physical Therapy Evaluation Patient Details Name: [name] MRN: [id number] DOB: 03-29-1946 Today's Date: 05/17/2016   History of Present Illness  71 year old male with past medical history of prostate cancer, tobacco use and COPD admitted for one week of worsening dyspnea and admitted for acute respiratory failure with hypoxia secondary to pneumonia and heart failure.  Pt s/p thoracentesis on 1/2 with 1.5 liter removal.  05/18/15 found to have complicated right pleural effusion and per Laurey Arrow, NP, pt likely to need VATs.  Clinical Impression  Pt admitted with above diagnosis. Pt currently with functional limitations due to the deficits listed below (see PT Problem List). Pt will benefit from skilled PT to increase their independence and safety with mobility to allow discharge to the venue listed below.  Discussed acute PT with pt and he was eager to ambulate more in hallway with monitoring of oxygen saturations.  Pt had just been seen by NP who reports possible VATs procedure and that pt would benefit from continued mobility in preparation for surgery.  Pt able to mobilize well and educated on oxygen saturations.  Encouraged pt to continue ambulating with staff during acute stay and PT to check back on progress, also likely to see after procedure.  Follow up recommendations and equipment needed may change s/p surgery.     Follow Up Recommendations No PT follow up (may change s/p procedure (possible VATs))    Equipment Recommendations  None recommended by PT    Recommendations for Other Services       Precautions / Restrictions Precautions Precaution Comments: monitor sats      Mobility  Bed Mobility Overal bed mobility: Modified Independent                Transfers Overall transfer level: Needs assistance Equipment used: None Transfers: Sit to/from Stand Sit to Stand: Min guard         General transfer comment: min/guard for safety,  lines  Ambulation/Gait Ambulation/Gait assistance: Min guard Ambulation Distance (Feet): 400 Feet Assistive device: None Gait Pattern/deviations: Step-through pattern     General Gait Details: pt steady, pushed IV pole, monitored SpO2 which mostly remained 93-95% on 3L O2 [place] (occasionally lower but poor wave form and pt with cold hands)  Stairs            Wheelchair Mobility    Modified Rankin (Stroke Patients Only)       Balance                                             Pertinent Vitals/Pain Pain Assessment: No/denies pain    Home Living Family/patient expects to be discharged to:: Private residence Living Arrangements: Spouse/significant other   Type of Home: House       Home Layout: One level Home Equipment: None      Prior Function Level of Independence: Independent               Hand Dominance        Extremity/Trunk Assessment        Lower Extremity Assessment Lower Extremity Assessment: Overall WFL for tasks assessed       Communication   Communication: No difficulties  Cognition Arousal/Alertness: Awake/alert Behavior During Therapy: WFL for tasks assessed/performed Overall Cognitive Status: Within Functional Limits for tasks assessed  General Comments      Exercises     Assessment/Plan    PT Assessment Patient needs continued PT services  PT Problem List Decreased mobility;Cardiopulmonary status limiting activity          PT Treatment Interventions DME instruction;Gait training;Functional mobility training;Therapeutic exercise;Therapeutic activities;Patient/family education    PT Goals (Current goals can be found in the Care Plan section)  Acute Rehab PT Goals PT Goal Formulation: With patient Time For Goal Achievement: 05/24/16 Potential to Achieve Goals: Good    Frequency Min 2X/week   Barriers to discharge        Co-evaluation               End of  Session Equipment Utilized During Treatment: Oxygen Activity Tolerance: Patient tolerated treatment well Patient left: in bed;with call bell/phone within reach;with family/visitor present           Time: 7583-0746 PT Time Calculation (min) (ACUTE ONLY): 22 min   Charges:   PT Evaluation $PT Eval Low Complexity: 1 Procedure     PT G Codes:        Navid Lenzen,KATHrine E 05/17/2016, 3:54 PM Carmelia Bake, PT, DPT 05/17/2016 Pager: [phone]

## 2016-05-17 NOTE — Progress Notes (Signed)
[facility] critical care  Subjective: Feels the same Leg swelling better HR under control Has been out of bed  Past Medical History:  Diagnosis Date  . Arthritis    "neck" (08/15/2015)  . Chronic bronchitis ([place])   . Constipation   . COPD (chronic obstructive pulmonary disease) ([place])   . DDD (degenerative disc disease), cervical   . Edema of left lower extremity   . Facial basal cell cancer   . H/O acne vulgaris 1960s   "led to my discharge from the [facility] in the mid 1960s"  . Headache    history of - left temporal- years ago- not current (08/15/2015)  . Pneumonia 1960s; 2015 X 2  . Prostate cancer ([place]) dx'd early 2000s   "low spreading; non aggressive type" (08/15/2015)  . Skin cancer    "back"     Objective: Vitals:   05/16/16 2041 05/16/16 2124 05/17/16 0650 05/17/16 0855  BP:  (!) 103/58 (!) 106/59   Pulse:  86 85   Resp:  19 (!) 21   Temp:  98.4 F (36.9 C) 98.5 F (36.9 C)   TempSrc:  Oral Oral   SpO2: 95% 92% 93% 93%  Weight:      Height:       2LNC  Gen: chronically ill appearing HENT: OP clear, TM's clear, neck supple PULM: Diminished R base, clear otherwise, normal effort, normal percussion CV: Irreg irreg, no mgr, trace edema GI: BS+, soft, nontender Derm: no cyanosis or rash Psyche: normal mood and affect   This morning's CXR images reviewed, showed effusion R>L which is increasing  Pleural fluid cytology report: inflammatory cells, no malignancy identified  Impression/plan:  Pleural effusion: parapneumonic, doubt related to volume overload at this point as it is increasing while he is diuresing; will plan for repeat thoracentesis this afternoon after heparin held, then CXR prn  CAP: continue IV antibiotics for 7 days  Afib: rate control/heparin per cardiology  Mediastinal/chest mass: needs bronchoscopy with EBUS when respiratory status improved, next week? If discharged home then will need arragements for this to be done as an  outpatient, ideally within 7 days  Wife updated bedside  Roselie Awkward, MD [place] [facility] Pager: [phone] Cell: [phone] After 3pm or if no response, call [phone]

## 2016-05-17 NOTE — Progress Notes (Signed)
Patient resting in bed with no complaints.  Wife is at bedside.

## 2016-05-18 DIAGNOSIS — J869 Pyothorax without fistula: Secondary | ICD-10-CM

## 2016-05-18 LAB — BODY FLUID CULTURE: CULTURE: NO GROWTH

## 2016-05-18 LAB — BASIC METABOLIC PANEL
ANION GAP: 9 (ref 5–15)
BUN: 33 mg/dL — ABNORMAL HIGH (ref 6–20)
CALCIUM: 8.4 mg/dL — AB (ref 8.9–10.3)
CHLORIDE: 96 mmol/L — AB (ref 101–111)
CO2: 30 mmol/L (ref 22–32)
Creatinine, Ser: 0.81 mg/dL (ref 0.61–1.24)
GFR calc non Af Amer: >60 mL/min (ref >60)
Glucose, Bld: 85 mg/dL (ref 65–99)
Potassium: 4.4 mmol/L (ref 3.5–5.1)
SODIUM: 135 mmol/L (ref 135–145)

## 2016-05-18 LAB — PROCALCITONIN

## 2016-05-18 LAB — CBC
HEMATOCRIT: 35 % — AB (ref 39.0–52.0)
HEMOGLOBIN: 12.2 g/dL — AB (ref 13.0–17.0)
MCH: 30.9 pg (ref 26.0–34.0)
MCHC: 34.9 g/dL (ref 30.0–36.0)
MCV: 88.6 fL (ref 78.0–100.0)
Platelets: 335 10*3/uL (ref 150–400)
RBC: 3.95 MIL/uL — ABNORMAL LOW (ref 4.22–5.81)
RDW: 14.8 % (ref 11.5–15.5)
WBC: 9.7 10*3/uL (ref 4.0–10.5)

## 2016-05-18 LAB — MAGNESIUM: MAGNESIUM: 1.6 mg/dL — AB (ref 1.7–2.4)

## 2016-05-18 MED ORDER — MAGNESIUM SULFATE 2 GM/50ML IV SOLN
2.0000 g | Freq: Once | INTRAVENOUS | Status: AC
  Administered 2016-05-18: 2 g via INTRAVENOUS
  Filled 2016-05-18: qty 50

## 2016-05-18 MED ORDER — FUROSEMIDE 40 MG PO TABS: 40.0000 mg | ORAL_TABLET | Freq: Every day | ORAL | Status: DC

## 2016-05-18 MED ORDER — PHENOL 1.4 % MT LIQD
1.0000 | OROMUCOSAL | Status: DC | PRN
Start: 2016-05-18 — End: 2016-05-31
  Administered 2016-05-18: 1 via OROMUCOSAL
  Filled 2016-05-18: qty 177

## 2016-05-18 MED ORDER — DOCUSATE SODIUM 100 MG PO CAPS
200.0000 mg | ORAL_CAPSULE | Freq: Two times a day (BID) | ORAL | Status: DC
  Administered 2016-05-18 – 2016-05-19 (×4): 200 mg via ORAL
  Filled 2016-05-18 (×5): qty 2

## 2016-05-18 NOTE — Progress Notes (Addendum)
PROGRESS NOTE  [name] VVO:[id number] DOB: 1946/04/12 DOA: 05/13/2016 PCP: [name]  HPI/Recap of past 78 hours: 71 year old male with past medical history of prostate cancer, tobacco use and COPD admitted to [facility] on 05/14/2016 for one week of worsening dyspnea and found to be in acute respiratory failure secondary to pneumonia, possible CHF and also in atrial fibrillation.   Patient noted to have bilateral pleural effusions although right side significantly large. Patient also noted to have a fair-sized mass on CT scan within the subcarinal region. Patient started on IV antibiotics plus supplemental oxygen. Patient seen by cardiology and started on Lasix for diastolic heart failure plus IV heparin for anticoagulation for A. fib. Seen by pulmonary and patient underwent thoracentesis on 1/2 yielding 1.5 L of fluid. Fluid sent for cytology negative for maliganancy.  Patient is to transfer to [place] to have right VAST and bronchoscopy with biopsy on 1/8  Assessment/Plan:    Acute respiratory failure with hypoxia secondary to pneumonia /pleural effusion and heart failure:  s/p thoracentesis by pccm on 1/2 with 1.5 liter removal. Fluid pathology no malignant cells seen, +acute inflammation. Culture no growth, gram stain negative for organisms.   Continued on lasix and abx Not able to Wean off oxggen, o2 dropped to low 80's while ambulating on room air.  Repeat imaging showed loculated right pleural effusion, thoracic consulted by pccm, patient is to transferred to [facility] over the weekend and to have right VATS done on 1/8 am.  Will hold all anticoagulation for now for anticipated thoracic surgical procedure on 1/8, resume anticoagulation post op if ok with thoracic surgery.   CAP (community acquired pneumonia):  On IV antibiotics. pro-calcitonin level trending down, leukocytosis normalized on 1/5, no fever. No cough, sputum sample not collected. mrsa screen negative.  Blood culture negative.  Mass in chest: Unclear etiology. Suspicious for malignancy. cytology no malignant cells from thoracentesis. Biopsy on 1/8 by Dr Lucianne Lei trigt.   Acute diastolic CHF (congestive heart failure) ([place]): Echocardiogram noted enlarged atria, preserved ejection fraction. Started on Lasix. Nutrition supplement. Good urine output, ankle edema down, cardiology consulted and signed off on 1/4.    New-onset A-fib [facility]): New diagnosis. Rate controlled.  Echocardiogram lvef wnl, diastolic function not able to assess due to afib.. Chads 2 score 4.  Will need long-term oral anticoagulation once all tests and workup complete, he has been on heparin drip which was stopped on 1/5. Cardiology consulted and signed off on 1/4.    Will hold all anticoagulation for now for anticipated thoracic surgical procedure, will need to resume anticoagulation post op if ok with thoracic surgery.   Hypokalemia/hypomagnesemia: replace k and mag  COPD (chronic obstructive pulmonary disease) ([place]): On nebulizers. Plus oxygen and antibiotics, no wheezing on exam.   Tobacco abuse: Counseled to quit  Protein-calorie malnutrition, severe [facility]): Patient meets criteria in the context of chronic illness. Seen by nutrition. Started on ensure 3 times a day   Hyponatremia: Mild, secondary to chf? Or pna.  Resolved  BPH, h/o low grade prostate cancer under active surveillance by urology. Start flomax.   Code Status: Full code   Family Communication: Wife at the bedside  Disposition Plan:  Need to transfer to [place] for thoracic surgery by Dr Prescott Gum    Consultants:  Pulmonary /critical care  Cardiology  Thoracic surgery  Procedures:  Echocardiogram done 1/2: Enlarged atria, preserved ejection fraction  Thoracentesis done 1/2 : 1.5 L fluid consistent  with transudate. No malignant cells by fluids cytology.  Antimicrobials:  IV Rocephin and Zithromax 1/1-present  Prior to admission, patient  on Levaquin 12/30-12/31   DVT prophylaxis:  Lovenox, then heparin drip, now on scd's, resume anticoagulation after thoracic surgery   Objective: Vitals:   05/17/16 1548 05/17/16 2026 05/17/16 2147 05/18/16 0544  BP:   105/62 120/62  Pulse:   76 97  Resp:   16 18  Temp:   98.4 F (36.9 C) 98.3 F (36.8 C)  TempSrc:   Oral Oral  SpO2: 93% 94% 93% 96%  Weight:    78.4 kg (172 lb 13.5 oz)  Height:        Intake/Output Summary (Last 24 hours) at 05/18/16 1043 Last data filed at 05/18/16 0951  Gross per 24 hour  Intake              684 ml  Output             1305 ml  Net             -621 ml   Filed Weights   05/15/16 0500 05/16/16 0453 05/18/16 0544  Weight: 82.9 kg (182 lb 12.2 oz) 79.3 kg (174 lb 13.2 oz) 78.4 kg (172 lb 13.5 oz)    Exam:   General:  Alert and oriented 3, no acute distress   Cardiovascular: Irregular rhythm, rate controlled   Respiratory: Decreased breath sounds bibasilar, no wheezing, no rhonchi, no rales  Abdomen: Soft, nontender, nondistended, positive bowel sounds   Musculoskeletal: 2+ pitting edema bilaterally from the ankles down has improved  Skin: No skin breaks, tears or lesions  Psychiatry: Patient is appropriate, no evidence of psychoses    Data Reviewed: CBC:  Recent Labs Lab 05/13/16 2259 05/14/16 0347 05/15/16 0911 05/17/16 0136 05/18/16 0639  WBC 20.2* 16.8* 12.3* 10.4 9.7  NEUTROABS 17.0*  --   --   --   --   HGB 13.8 12.2* 13.5 11.6* 12.2*  HCT 39.4 35.9* 39.5 34.8* 35.0*  MCV 87.4 88.4 88.6 89.5 88.6  PLT 359 332 331 310 280   Basic Metabolic Panel:  Recent Labs Lab 05/13/16 2259 05/14/16 0347 05/15/16 0911 05/16/16 0507 05/17/16 0136 05/18/16 0639  NA 130* 131* 133* 135 137 135  K 4.4 4.3 3.6 4.1 4.6 4.4  CL 98* 99* 99* 101 101 96*  CO2 _0 GLUCOSE 113* 105* 109* 132* 118* 85  BUN 40* 39* 40* 41* 37* 33*  CREATININE 1.29* 1.08 1.12 0.82 0.90 0.81  CALCIUM 8.5* 8.1* 8.4* 8.1* 8.2* 8.4*    MG 1.8 1.9  --   --  1.7 1.6*  PHOS  --  4.0  --   --   --   --    GFR: Estimated Creatinine Clearance: 87.8 mL/min (by C-G formula based on SCr of 0.81 mg/dL). Liver Function Tests:  Recent Labs Lab 05/14/16 0037 05/14/16 0347 05/14/16 1642 05/17/16 0136  AST 38 33  --  36  ALT 30 30  --  34  ALKPHOS 218* 219*  --  251*  BILITOT 1.0 1.3*  --  0.3  PROT 5.7* 5.8* 6.4* 4.9*  ALBUMIN 2.1* 2.2*  --  1.9*   No results for input(s): LIPASE, AMYLASE in the last 168 hours. No results for input(s): AMMONIA in the last 168 hours. Coagulation Profile: No results for input(s): INR, PROTIME in the last 168 hours. Cardiac Enzymes:  Recent Labs Lab 05/14/16 0037  05/14/16 0604 05/14/16 1247  TROPONINI 0.05* 0.05* 0.06*   BNP (last 3 results) No results for input(s): PROBNP in the last 8760 hours. HbA1C: No results for input(s): HGBA1C in the last 72 hours. CBG:  Recent Labs Lab 05/17/16 2151  GLUCAP 124*   Lipid Profile: No results for input(s): CHOL, HDL, LDLCALC, TRIG, CHOLHDL, LDLDIRECT in the last 72 hours. Thyroid Function Tests: No results for input(s): TSH, T4TOTAL, FREET4, T3FREE, THYROIDAB in the last 72 hours. Anemia Panel: No results for input(s): VITAMINB12, FOLATE, FERRITIN, TIBC, IRON, RETICCTPCT in the last 72 hours. Urine analysis:    Component Value Date/Time   COLORURINE YELLOW 05/14/2016 0032   APPEARANCEUR CLEAR 05/14/2016 0032   LABSPEC 1.011 05/14/2016 0032   PHURINE 5.0 05/14/2016 0032   GLUCOSEU NEGATIVE 05/14/2016 0032   HGBUR NEGATIVE 05/14/2016 0032   BILIRUBINUR NEGATIVE 05/14/2016 0032   KETONESUR NEGATIVE 05/14/2016 0032   PROTEINUR 30 (A) 05/14/2016 0032   NITRITE NEGATIVE 05/14/2016 0032   LEUKOCYTESUR NEGATIVE 05/14/2016 0032   Sepsis Labs: _0 (procalcitonin:4,lacticidven:4)  ) Recent Results (from the past 240 hour(s))  Blood culture (routine x 2)     Status: None (Preliminary result)   Collection Time: 05/14/16 12:02  AM  Result Value Ref Range Status   Specimen Description BLOOD RIGHT ANTECUBITAL  Final   Special Requests BOTTLES DRAWN AEROBIC AND ANAEROBIC 5ML  Final   Culture   Final    NO GROWTH 3 DAYS Performed at [facility]    Report Status PENDING  Incomplete  Blood culture (routine x 2)     Status: None (Preliminary result)   Collection Time: 05/14/16 12:02 AM  Result Value Ref Range Status   Specimen Description BLOOD LEFT ANTECUBITAL  Final   Special Requests BOTTLES DRAWN AEROBIC AND ANAEROBIC 5ML  Final   Culture   Final    NO GROWTH 3 DAYS Performed at [facility]    Report Status PENDING  Incomplete  Body fluid culture     Status: None   Collection Time: 05/14/16  3:30 PM  Result Value Ref Range Status   Specimen Description PLEURAL  Final   Special Requests NONE  Final   Gram Stain   Final    MODERATE WBC PRESENT, PREDOMINANTLY MONONUCLEAR NO ORGANISMS SEEN    Culture   Final    NO GROWTH 3 DAYS Performed at [facility]    Report Status 05/18/2016 FINAL  Final  MRSA PCR Screening     Status: None   Collection Time: 05/16/16  4:33 PM  Result Value Ref Range Status   MRSA by PCR NEGATIVE NEGATIVE Final    Comment:        The GeneXpert MRSA Assay (FDA approved for NASAL specimens only), is one component of a comprehensive MRSA colonization surveillance program. It is not intended to diagnose MRSA infection nor to guide or monitor treatment for MRSA infections.   Body fluid culture     Status: None (Preliminary result)   Collection Time: 05/17/16  5:07 PM  Result Value Ref Range Status   Specimen Description PLEURAL  Final   Special Requests NONE  Final   Gram Stain   Final    FEW WBC PRESENT,BOTH PMN AND MONONUCLEAR NO ORGANISMS SEEN    Culture   Final    NO GROWTH 1 DAY Performed at [facility]    Report Status PENDING  Incomplete      Studies: Ct Chest Wo Contrast  Result  Date: 05/17/2016 CLINICAL DATA:  Worsening  dyspnea.  COPD. EXAM: CT CHEST WITHOUT CONTRAST TECHNIQUE: Multidetector CT imaging of the chest was performed following the standard protocol without IV contrast. COMPARISON:  05/14/2016 FINDINGS: Cardiovascular: Normal heart size. Aortic atherosclerosis. Calcification in the LAD and RCA coronary arteries noted. No pericardial effusion. Mediastinum/Nodes: Calcified right paratracheal, right hilar, and sub- carinal lymph nodes identified compatible with prior granulomatous disease. Enlarged mediastinal lymph nodes are again noted. Within the sub- carinal region there is a 3.3 cm lymph node mass, image 84 of series 2. Similar to previous exam. There is a enlarged left paratracheal lymph node which appears noncalcified measuring 1.4 cm, image 69 of series 2. Also similar to previous exam. Left internal mammary lymph node appears enlarged measuring 1.4 cm, image number 55 of series 2. Unchanged from previous exam. Similar appearance of prominent right axillary lymph nodes which measure up to 11 mm, image 58 of series 2. Lungs/Pleura: Advanced changes of centrilobular emphysema identified. Large right pleural effusion now appears complex and loculated. Loculated gas within posterior right pleural fluid collection is noted measuring 4.8 cm and is new from 05/14/2016. Findings are worrisome for empyema. Small to moderate simple appearing left pleural effusion noted. Upper Abdomen: No acute abnormality identified. Musculoskeletal: Scoliosis deformity is noted involving the thoracic spine. There is multi level degenerative disc disease identified. IMPRESSION: 1. Large complex right pleural effusion now appears loculated. There is a collection of gas within the posterior basilar component of the loculated right pleural effusion which is worrisome for empyema. 2. Persistent enlarged mediastinal lymph nodes and a left internal mammary lymph node. Differential considerations include metastatic adenopathy, lymphoproliferative  disorder, granulomatous inflammation/infection or (less likely) reactive adenopathy. Consider further evaluation with PET-CT with possible tissue sampling following resolution of this acute episode. 3. Emphysema 4. Aortic atherosclerosis and coronary artery calcification 5. Scoliosis and degenerative disc disease 6. Prior granulomatous disease. Electronically Signed   By: Kerby Moors M.D.   On: 05/17/2016 17:35   Dg Chest Port 1 View  Result Date: 05/17/2016 CLINICAL DATA:  Post right thoracentesis EXAM: PORTABLE CHEST 1 VIEW COMPARISON:  05/17/2016 FINDINGS: Cardiomegaly. Again noted mild congestion/interstitial edema bilaterally. Slight improvement in aeration right lower lung. Persistent small right pleural effusion with right basilar atelectasis or infiltrate. There is no pneumothorax. IMPRESSION: Again noted mild congestion/interstitial edema bilaterally. Slight improvement in aeration right lower lung. Persistent small right pleural effusion with right basilar atelectasis or infiltrate. There is no pneumothorax. Electronically Signed   By: Lahoma Crocker M.D.   On: 05/17/2016 14:50    Scheduled Meds: . aspirin  81 mg Oral Daily  . azithromycin  500 mg Oral Daily  . cefTRIAXone (ROCEPHIN)  IV  1 g Intravenous Q24H  . docusate sodium  200 mg Oral BID  . feeding supplement (ENSURE ENLIVE)  237 mL Oral TID BM  . fluticasone  1 spray Each Nare Daily  . furosemide  40 mg Oral BID  . guaiFENesin  600 mg Oral BID  . ipratropium  0.5 mg Nebulization TID  . levalbuterol  1.25 mg Nebulization TID  . nicotine  21 mg Transdermal Daily  . potassium chloride  20 mEq Oral BID  . sodium chloride flush  3 mL Intravenous Q12H  . sodium chloride flush  3 mL Intravenous Q12H  . tamsulosin  0.4 mg Oral QPC breakfast    Continuous Infusions:    LOS: 4 days   Time spent: 2mns  Avalina Benko, MD PhD [facility]  Pager [phone]  If 7PM-7AM, please contact night-coverage [email or web address] Password  [email or web address] 05/18/2016, 10:43 AM

## 2016-05-18 NOTE — Progress Notes (Signed)
[place] pulmonary and critical care  Subjective: No complaints        Objective: Vitals:   05/17/16 1548 05/17/16 2026 05/17/16 2147 05/18/16 0544  BP:   105/62 120/62  Pulse:   76 97  Resp:   16 18  Temp:   98.4 F (36.9 C) 98.3 F (36.8 C)  TempSrc:   Oral Oral  SpO2: 93% 94% 93% 96%  Weight:    172 lb 13.5 oz (78.4 kg)  Height:        - Note    02 sats  96% on 3lpm   Gen: chronically ill appearing HENT: OP clear, TM's clear, neck supple PULM: Diminished R base, clear otherwise, normal effort, normal percussion CV: Irreg irreg, no mgr, trace edema GI: BS+, soft, nontender Derm: no cyanosis or rash Psyche: normal mood and affect     Impression/plan:  R Pleural effusion: parapneumonic likely  - Tapped 05/15/15 > glucose 33/wbc 7920 with Polys> Lymphs/ no growth to date  Neg cyt - tapped again 05/18/15 x 125 cc only and lots of septations/ LDH trending down but still quite high   Clinical course and glucose as well as US findings at tap 05/18/15 all strongly favor complicated parapneumonic process   CAP: continue IV antibiotics to complete 7 days total   Afib: rate control/heparin per cardiology  Mediastinal/chest mass: needs bronchoscopy with EBUS when respiratory status improved, next week? If discharged home then will need arragements for this to be done as an outpatient, ideally within 7 days     Wife updated bedside   Seen by Dr Lawson Fiscal with plans for VATS 05/21/16 so PCCM service will be available prn     Christinia Gully, MD Pulmonary and [place] [phone] After 5:30 PM or weekends, call [phone]

## 2016-05-18 NOTE — Progress Notes (Signed)
Patient transferred to [facility] at this time via [facility] in no acute episode.  Alert and oriented x4.  VS stable.

## 2016-05-18 NOTE — Consult Note (Signed)
[address]       [place] [address]             [phone]        [name] [name] Date of Birth: 08/03/1945  Referring: Roselie Awkward M.D.  Primary Care: [name]  Chief Complaint:    Chief Complaint  Patient presents with  . Leg Swelling  Patient examined, chest x-rays, CT scan of chest and 2-D echo cardiogram images personally reviewed and counseled with patient  History of Present Illness:     The patient is a 71 year old male active smoker who is admitted to the hospital with shortness of breath and low oxygen saturation on room air. He had been previously treated for bronchopneumonia with oral Levaquin at a urgent care facility. At the time of admission he had significant lower extremity edema, was in atrial fibrillation and had a large right pleural effusion. The patient was admitted and placed on IV antibiotics. Chest CT scan demonstrated a large complex right pleural effusion with a subcarinal mass. Thoracentesis returned 1.2 L without significant improvement in chest x-ray. Cytology was negative. Cultures have been no growth but the patient has been on IV Rocephin and oral azithromycin. A repeat attempt at thoracentesis removed minimal fluid due to the loculated process. Thoracic surgical consultation for VATS to drain the complex effusion-empyema was placed.  The patient states he has had gradual weight loss over the preceding months with fatigue and decreased exercise tolerance. He denies any chest wall pain. His temperature and white count have been normal.   The patient was seen by cardiology for his atrial fibrillation and echocardiogram was performed. This shows good LV function, no pericardial effusion, no significant valvular disease and mildly elevated right-sided pressures with mild TR. He was placed on IV heparin for a few days in preparation for possible bronchoscopy with biopsy and then plans for   NOAC for management of his atrial fibrillation.[name] He is now off the IV heparin and I placed him on prophylactic dose Lovenox in preparation for VATS Monday, January 8 at [facility]. The patient's peripheral edema has improved with Lasix therapy.  The patient will be scheduled for bronchoscopy with transbronchial biopsy of the subcarinal mass followed by right VATS with drainage of loculated effusion-empyema. I discussed the procedure with the patient and his wife. 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 He smokes one pack per day   Zubrod Score: At the time of surgery this patient's most appropriate activity status/level should be described as: _0     0    Normal activity, no symptoms _1     1    Restricted in physical strenuous activity but ambulatory, able to do out light work _2     2    Ambulatory and capable of self care, unable to do work activities, up and about                 more than 50%  Of the time                            _3     3    Only limited self care, in bed greater than 50% of waking hours _4   4    Completely disabled, no self care, confined to bed or chair _0     5    Moribund  Past Medical History:  Diagnosis Date  . Arthritis    "neck" (08/15/2015)  . Chronic bronchitis ([place])   . Constipation   . COPD (chronic obstructive pulmonary disease) ([place])   . DDD (degenerative disc disease), cervical   . Edema of left lower extremity   . Facial basal cell cancer   . H/O acne vulgaris 1960s   "led to my discharge from the [facility] in the mid 1960s"  . Headache    history of - left temporal- years ago- not current (08/15/2015)  . Pneumonia 1960s; 2015 X 2  . Prostate cancer ([place]) dx'd early 2000s   "low spreading; non aggressive type" (08/15/2015)  . Skin cancer    "back"    Past Surgical History:  Procedure  Laterality Date  . COLONOSCOPY    . EXCISIONAL HEMORRHOIDECTOMY  1960s  . INGUINAL HERNIA REPAIR Right 2002  . INGUINAL HERNIA REPAIR Bilateral 08/15/2015  . INGUINAL HERNIA REPAIR Bilateral 08/15/2015   Procedure: OPEN REPAIR RECURRENT RIGHT INGUINAL HERNIA WITH MESH AND REPAIR LEFT INGUINAL HERNIA WITH MESH;  Surgeon: [name], MD;  Location: [place];  Service: General;  Laterality: Bilateral;  . INSERTION OF MESH Bilateral 08/15/2015   Procedure: INSERTION OF MESH;  Surgeon: [name], MD;  Location: [place];  Service: General;  Laterality: Bilateral;  . PROSTATE BIOPSY    . TONSILLECTOMY  1950s    History  Smoking Status  . Current Every Day Smoker  . Packs/day: 1.00  . Years: 62.00  . Types: Cigarettes  Smokeless Tobacco  . Never Used    History  Alcohol Use No    Social History   Social History  . Marital status: Married    Spouse name: N/A  . Number of children: N/A  . Years of education: N/A   Occupational History  . Not on file.   Social History Main Topics  . Smoking status: Current Every Day Smoker    Packs/day: 1.00    Years: 62.00    Types: Cigarettes  . Smokeless tobacco: Never Used  . Alcohol use No  . Drug use: No  . Sexual activity: Not Currently   Other Topics Concern  . Not on file   Social History Narrative  . No narrative on file    Allergies  Allergen Reactions  . Demerol [Meperidine] Other (See Comments)    Causes system to shutdown. ?     Current Facility-Administered Medications  Medication Dose Route Frequency Provider Last Rate Last Dose  . 0.9 %  sodium chloride infusion  250 mL Intravenous PRN Toy Baker, MD      . acetaminophen (TYLENOL) tablet 650 mg  650 mg Oral Q6H PRN Toy Baker, MD   650 mg at 05/16/16 0039   Or  . acetaminophen (TYLENOL) suppository 650 mg  650 mg Rectal Q6H PRN Toy Baker, MD      . antiseptic oral rinse (BIOTENE) solution 15 mL  15 mL Mouth Rinse PRN Florencia Reasons, MD      .  aspirin chewable tablet 81 mg  81 mg Oral Daily Toy Baker, MD   81 mg at 05/18/16 1026  . azithromycin (ZITHROMAX) tablet 500 mg  500 mg Oral Daily Annita Brod, MD   500 mg at 05/18/16 1026  . cefTRIAXone (ROCEPHIN) 1 g in dextrose 5 % 50 mL IVPB  1 g Intravenous Q24H Toy Baker, MD   1 g at 05/18/16 3662  . docusate sodium (COLACE) capsule 200 mg  200 mg Oral BID Florencia Reasons, MD   200 mg at 05/18/16 1140  . feeding supplement (ENSURE ENLIVE) (ENSURE ENLIVE) liquid 237 mL  237 mL Oral TID BM Annita Brod, MD   237 mL at 05/18/16 1032  . fluticasone (FLONASE) 50 MCG/ACT nasal spray 1 spray  1 spray Each Nare Daily Florencia Reasons, MD   1 spray at 05/18/16 1033  . [START ON 05/19/2016] furosemide (LASIX) tablet 40 mg  40 mg Oral Daily [name], MD      . guaiFENesin [facility]) 12 hr tablet 600 mg  600 mg Oral BID Toy Baker, MD   600 mg at 05/18/16 1026  . HYDROcodone-acetaminophen (NORCO/VICODIN) 5-325 MG per tablet 1-2 tablet  1-2 tablet Oral Q4H PRN Toy Baker, MD   2 tablet at 05/17/16 2100  . ipratropium (ATROVENT) nebulizer solution 0.5 mg  0.5 mg Nebulization TID Annita Brod, MD   0.5 mg at 05/18/16 1044  . levalbuterol (XOPENEX) nebulizer solution 0.63 mg  0.63 mg Nebulization Q4H PRN Toy Baker, MD   0.63 mg at 05/15/16 0935  . levalbuterol (XOPENEX) nebulizer solution 1.25 mg  1.25 mg Nebulization TID Annita Brod, MD   1.25 mg at 05/18/16 1044  . nicotine (NICODERM CQ - dosed in mg/24 hours) patch 21 mg  21 mg Transdermal Daily Toy Baker, MD   21 mg at 05/18/16 1026  . ondansetron (ZOFRAN) tablet 4 mg  4 mg Oral Q6H PRN Toy Baker, MD       Or  . ondansetron (ZOFRAN) injection 4 mg  4 mg Intravenous Q6H PRN Toy Baker, MD      . phenol (CHLORASEPTIC) mouth spray 1 spray  1 spray Mouth/Throat PRN Florencia Reasons, MD   1 spray at 05/18/16 0657  . polyethylene glycol (MIRALAX / GLYCOLAX) packet 17 g  17 g Oral Daily PRN  Toy Baker, MD      . potassium chloride SA (K-DUR,KLOR-CON) CR tablet 20 mEq  20 mEq Oral BID [name], MD   20 mEq at 05/18/16 1027  . sodium chloride (OCEAN) 0.65 % nasal spray 1 spray  1 spray Each Nare PRN Annita Brod, MD   1 spray at 05/16/16 1634  . sodium chloride flush (NS) 0.9 % injection 3 mL  3 mL Intravenous Q12H Toy Baker, MD   3 mL at 05/18/16 1032  . sodium chloride flush (NS) 0.9 % injection 3 mL  3 mL Intravenous Q12H Toy Baker, MD   3 mL at 05/18/16 1032  . sodium chloride flush (NS) 0.9 % injection 3 mL  3 mL Intravenous PRN Toy Baker, MD      . tamsulosin (FLOMAX) capsule 0.4 mg  0.4 mg Oral QPC breakfast Florencia Reasons, MD   0.4 mg at 05/18/16 9476    Prescriptions Prior to Admission  Medication Sig Dispense Refill Last Dose  . Ascorbic Acid (VITAMIN C) 1000 MG tablet Take 1,000 mg by mouth 2 (two) times daily.   Past Week at Unknown time  . aspirin 81 MG tablet Take 81 mg by mouth daily. Reported on 05/08/2015   05/12/2016 at Unknown time  . fluocinonide-emollient (LIDEX-E) 0.05 % cream Apply 1 application topically daily as needed (for skin).    unknown  . ibuprofen (ADVIL,MOTRIN) 200 MG tablet Take 800 mg by mouth 2 (two)  times daily as needed for moderate pain (for arthritiis).   unknown  . levofloxacin (LEVAQUIN) 750 MG tablet Take 750 mg by mouth daily. x5 days   05/12/2016 at Unknown time  . oxyCODONE-acetaminophen (PERCOCET/ROXICET) 5-325 MG tablet Take 1-2 tablets by mouth every 4 (four) hours as needed for moderate pain. (Patient not taking: Reported on 05/13/2016) 30 tablet 0 Not Taking at Unknown time  . tamsulosin (FLOMAX) 0.4 MG CAPS capsule Take 1 capsule (0.4 mg total) by mouth daily after breakfast. (Patient not taking: Reported on 05/13/2016) 30 capsule 1 Not Taking at Unknown time    Family History  Problem Relation Age of Onset  . Alcoholism Father   . CAD Father   . Ovarian cancer Sister   . Diabetes Neg Hx       Review of Systems:       Cardiac Review of Systems: Y or N  Chest Pain [  No  ]  Resting SOB [no   ] Exertional SOB  [  yes]  Orthopnea [ yes ]   Pedal Edema [  yes ]    Palpitations [yes-new onset A. fib  ] Syncope  [no  ]   Presyncope [no   ]  General Review of Systems: [Y] = yes [  ]=no Constitional: recent weight change [ weight loss ]; anorexia [ yes ]; fatigue [ yes ]; nausea [  ]; night sweats [  ]; fever [  ]; or chills [  ]                                                               Dental: poor dentition[  ]; Last Dentist visit: One year  Eye : blurred vision [  ]; diplopia [   ]; vision changes [  ];  Amaurosis fugax[  ]; Resp: cough [ yes ];  wheezing[  ];  hemoptysis[  ]; shortness of breath[ yes ]; paroxysmal nocturnal dyspnea[  ]; dyspnea on exertion[  ]; or orthopnea[ yes ];  GI:  gallstones[  ], vomiting[  ];  dysphagia[  ]; melena[  ];  hematochezia [  ]; heartburn[  ];   Hx of  Colonoscopy[  ]; GU: kidney stones [  ]; hematuria[  ];   dysuria [  ];  nocturia[  ];  history of     obstruction [  ]; urinary frequency [ yes ]history of treated prostate cancer             Skin: rash, swelling[  ];, hair loss[  ];  peripheral edema[  ];  or itching[  ]; Musculosketetal: myalgias[  ];  joint swelling[  ];  joint erythema[  ];  joint pain[  ];  back pain[ yes status post laminectomy, lumbar ];  Heme/Lymph: bruising[  ];  bleeding[  ];  anemia[  ];  Neuro: TIA[  ];  headaches[  ];  stroke[  ];  vertigo[  ];  seizures[  ];   paresthesias[  ];  difficulty walking[  ];  Psych:depression[  ]; anxiety[  ];  Endocrine: diabetes[  ];  thyroid dysfunction[  ];  Immunizations: Flu [  ]; Pneumococcal[  ];  Other: Right-hand dominant. No history of thoracic trauma or pneumothorax  Physical Exam: BP 120/62 (BP  Location: Right Arm)   Pulse 97   Temp 98.3 F (36.8 C) (Oral)   Resp 18   Ht 5' 11" (1.803 m)   Wt 172 lb 13.5 oz (78.4 kg)   SpO2 96%   BMI 24.11 kg/m         Physical Exam  General: Thin 71 year old Caucasian male no acute distress HEENT: Normocephalic pupils equal , dentition adequate Neck: Supple without JVD, adenopathy, or bruit Chest: Diminished breath sounds on the right side, scattered rhonchi, no tenderness             or deformity Cardiovascular: Irregular irregular rhythm, no murmur, no gallop, peripheral pulses             palpable in all extremities Abdomen:  Soft, nontender, no palpable mass or organomegaly Extremities: Warm, well-perfused, no clubbing cyanosis edema or tenderness,              no venous stasis changes of the legs Rectal/GU: Deferred Neuro: Grossly non--focal and symmetrical throughout Skin: Clean and dry without rash or ulceration   Diagnostic Studies & Laboratory data:     Recent Radiology Findings:   Dg Chest 2 View  Result Date: 05/17/2016 CLINICAL DATA:  Shortness of breath. EXAM: CHEST  2 VIEW COMPARISON:  Radiographs of May 14, 2016. FINDINGS: Stable cardiomegaly. Continued presence of bilateral diffuse interstitial densities are noted concerning for pulmonary edema. Stable mild left pleural effusion is noted. Right pleural effusion is significantly increased compared to prior exam. No pneumothorax is noted. Bony thorax is unremarkable. IMPRESSION: Cardiomegaly and bilateral pulmonary edema with increased right pleural effusion consistent with congestive heart failure. Electronically Signed   By: Marijo Conception, M.D.   On: 05/17/2016 08:46   Ct Chest Wo Contrast  Result Date: 05/17/2016 CLINICAL DATA:  Worsening dyspnea.  COPD. EXAM: CT CHEST WITHOUT CONTRAST TECHNIQUE: Multidetector CT imaging of the chest was performed following the standard protocol without IV contrast. COMPARISON:  05/14/2016 FINDINGS: Cardiovascular: Normal heart size. Aortic atherosclerosis. Calcification in the LAD and RCA coronary arteries noted. No pericardial effusion. Mediastinum/Nodes: Calcified right paratracheal, right hilar,  and sub- carinal lymph nodes identified compatible with prior granulomatous disease. Enlarged mediastinal lymph nodes are again noted. Within the sub- carinal region there is a 3.3 cm lymph node mass, image 84 of series 2. Similar to previous exam. There is a enlarged left paratracheal lymph node which appears noncalcified measuring 1.4 cm, image 69 of series 2. Also similar to previous exam. Left internal mammary lymph node appears enlarged measuring 1.4 cm, image number 55 of series 2. Unchanged from previous exam. Similar appearance of prominent right axillary lymph nodes which measure up to 11 mm, image 58 of series 2. Lungs/Pleura: Advanced changes of centrilobular emphysema identified. Large right pleural effusion now appears complex and loculated. Loculated gas within posterior right pleural fluid collection is noted measuring 4.8 cm and is new from 05/14/2016. Findings are worrisome for empyema. Small to moderate simple appearing left pleural effusion noted. Upper Abdomen: No acute abnormality identified. Musculoskeletal: Scoliosis deformity is noted involving the thoracic spine. There is multi level degenerative disc disease identified. IMPRESSION: 1. Large complex right pleural effusion now appears loculated. There is a collection of gas within the posterior basilar component of the loculated right pleural effusion which is worrisome for empyema. 2. Persistent enlarged mediastinal lymph nodes and a left internal mammary lymph node. Differential considerations include metastatic adenopathy, lymphoproliferative disorder, granulomatous inflammation/infection or (less likely) reactive adenopathy. Consider further evaluation  with PET-CT with possible tissue sampling following resolution of this acute episode. 3. Emphysema 4. Aortic atherosclerosis and coronary artery calcification 5. Scoliosis and degenerative disc disease 6. Prior granulomatous disease. Electronically Signed   By: Kerby Moors M.D.   On:  05/17/2016 17:35   Dg Chest Port 1 View  Result Date: 05/17/2016 CLINICAL DATA:  Post right thoracentesis EXAM: PORTABLE CHEST 1 VIEW COMPARISON:  05/17/2016 FINDINGS: Cardiomegaly. Again noted mild congestion/interstitial edema bilaterally. Slight improvement in aeration right lower lung. Persistent small right pleural effusion with right basilar atelectasis or infiltrate. There is no pneumothorax. IMPRESSION: Again noted mild congestion/interstitial edema bilaterally. Slight improvement in aeration right lower lung. Persistent small right pleural effusion with right basilar atelectasis or infiltrate. There is no pneumothorax. Electronically Signed   By: Lahoma Crocker M.D.   On: 05/17/2016 14:50     I have independently reviewed the above radiologic studies.  Recent Lab Findings: Lab Results  Component Value Date   WBC 9.7 05/18/2016   HGB 12.2 (L) 05/18/2016   HCT 35.0 (L) 05/18/2016   PLT 335 05/18/2016   GLUCOSE 85 05/18/2016   ALT 34 05/17/2016   AST 36 05/17/2016   NA 135 05/18/2016   K 4.4 05/18/2016   CL 96 (L) 05/18/2016   CREATININE 0.81 05/18/2016   BUN 33 (H) 05/18/2016   CO2 30 05/18/2016   TSH 1.187 05/14/2016      Assessment / Plan:     71 year old gentleman presents with shortness of breath, cough, large complex right pleural effusion and a subcarinal mass on CT scan associated with increasing weakness weight loss and pedal edema. Although the cytology of the thoracentesis was negative the patient most likely has a thoracic malignancy.  We'll plan right VATS to drain the loculated right effusion-empyema and bronchoscopy with transbronchial biopsy to evaluate for malignancy. Procedure will be performed at  a.m. December 8. I discussed the plans for surgery the patient is wife and they understand and agree to proceed.     _0 @ 05/18/2016 11:50 AM

## 2016-05-19 LAB — CULTURE, BLOOD (ROUTINE X 2)
Culture: NO GROWTH
Culture: NO GROWTH

## 2016-05-19 LAB — CBC WITH DIFFERENTIAL/PLATELET
BASOS PCT: 0 %
Basophils Absolute: 0 10*3/uL (ref 0.0–0.1)
Eosinophils Absolute: 0.1 10*3/uL (ref 0.0–0.7)
Eosinophils Relative: 1 %
HEMATOCRIT: 35.5 % — AB (ref 39.0–52.0)
HEMOGLOBIN: 11.9 g/dL — AB (ref 13.0–17.0)
LYMPHS ABS: 2.2 10*3/uL (ref 0.7–4.0)
LYMPHS PCT: 17 %
MCH: 30.1 pg (ref 26.0–34.0)
MCHC: 33.5 g/dL (ref 30.0–36.0)
MCV: 89.6 fL (ref 78.0–100.0)
MONOS PCT: 12 %
Monocytes Absolute: 1.5 10*3/uL — ABNORMAL HIGH (ref 0.1–1.0)
NEUTROS ABS: 9.1 10*3/uL — AB (ref 1.7–7.7)
Neutrophils Relative %: 70 %
Platelets: 350 10*3/uL (ref 150–400)
RBC: 3.96 MIL/uL — ABNORMAL LOW (ref 4.22–5.81)
RDW: 14.9 % (ref 11.5–15.5)
WBC: 12.9 10*3/uL — ABNORMAL HIGH (ref 4.0–10.5)

## 2016-05-19 LAB — BASIC METABOLIC PANEL
Anion gap: 8 (ref 5–15)
BUN: 27 mg/dL — AB (ref 6–20)
CALCIUM: 8.4 mg/dL — AB (ref 8.9–10.3)
CHLORIDE: 98 mmol/L — AB (ref 101–111)
CO2: 28 mmol/L (ref 22–32)
CREATININE: 0.79 mg/dL (ref 0.61–1.24)
GFR calc non Af Amer: >60 mL/min (ref >60)
Glucose, Bld: 104 mg/dL — ABNORMAL HIGH (ref 65–99)
Potassium: 4.9 mmol/L (ref 3.5–5.1)
SODIUM: 134 mmol/L — AB (ref 135–145)

## 2016-05-19 LAB — BLOOD GAS, ARTERIAL
Acid-Base Excess: 6.2 mmol/L — ABNORMAL HIGH (ref 0.0–2.0)
Bicarbonate: 29.5 mmol/L — ABNORMAL HIGH (ref 20.0–28.0)
Drawn by: 406621
O2 Content: 4 L/min
O2 Saturation: 93.4 %
Patient temperature: 98.6
pCO2 arterial: 37.3 mmHg (ref 32.0–48.0)
pH, Arterial: 7.509 — ABNORMAL HIGH (ref 7.350–7.450)
pO2, Arterial: 68.7 mmHg — ABNORMAL LOW (ref 83.0–108.0)

## 2016-05-19 LAB — MAGNESIUM
Magnesium: 1.7 mg/dL (ref 1.7–2.4)
Magnesium: 1.8 mg/dL (ref 1.7–2.4)

## 2016-05-19 LAB — PROTIME-INR
INR: 1.15
Prothrombin Time: 14.8 seconds (ref 11.4–15.2)

## 2016-05-19 LAB — COMPREHENSIVE METABOLIC PANEL
ALT: 28 U/L (ref 17–63)
AST: 28 U/L (ref 15–41)
Albumin: 1.7 g/dL — ABNORMAL LOW (ref 3.5–5.0)
Alkaline Phosphatase: 254 U/L — ABNORMAL HIGH (ref 38–126)
Anion gap: 8 (ref 5–15)
BUN: 26 mg/dL — ABNORMAL HIGH (ref 6–20)
CO2: 27 mmol/L (ref 22–32)
Calcium: 8.5 mg/dL — ABNORMAL LOW (ref 8.9–10.3)
Chloride: 97 mmol/L — ABNORMAL LOW (ref 101–111)
Creatinine, Ser: 0.84 mg/dL (ref 0.61–1.24)
GFR calc Af Amer: >60 mL/min (ref >60)
GFR calc non Af Amer: >60 mL/min (ref >60)
Glucose, Bld: 109 mg/dL — ABNORMAL HIGH (ref 65–99)
Potassium: 5 mmol/L (ref 3.5–5.1)
Sodium: 132 mmol/L — ABNORMAL LOW (ref 135–145)
Total Bilirubin: 0.5 mg/dL (ref 0.3–1.2)
Total Protein: 5.2 g/dL — ABNORMAL LOW (ref 6.5–8.1)

## 2016-05-19 LAB — TROPONIN I: TROPONIN I: 0.05 ng/mL — AB (ref <0.03)

## 2016-05-19 LAB — APTT: aPTT: 30 seconds (ref 24–36)

## 2016-05-19 LAB — ABO/RH: ABO/RH(D): A POS

## 2016-05-19 LAB — PREPARE RBC (CROSSMATCH)

## 2016-05-19 MED ORDER — ENOXAPARIN SODIUM 40 MG/0.4ML ~~LOC~~ SOLN
40.0000 mg | SUBCUTANEOUS | Status: AC
  Administered 2016-05-19: 40 mg via SUBCUTANEOUS
  Filled 2016-05-19: qty 0.4

## 2016-05-19 MED ORDER — AMIODARONE HCL 200 MG PO TABS
400.0000 mg | ORAL_TABLET | Freq: Two times a day (BID) | ORAL | Status: DC
  Administered 2016-05-19 (×2): 400 mg via ORAL
  Filled 2016-05-19 (×2): qty 2

## 2016-05-19 MED ORDER — DEXTROSE 5 % IV SOLN
1.5000 g | INTRAVENOUS | Status: AC
  Administered 2016-05-20: 1.5 g via INTRAVENOUS
  Filled 2016-05-19: qty 1.5

## 2016-05-19 NOTE — Progress Notes (Addendum)
Pt had a 10 beat run of vtach. Pt is resting. MD paged awaiting response. Will continue to monitor.   [id number]: MD ordered Magnesium and BMP.

## 2016-05-19 NOTE — Progress Notes (Signed)
Procedure(s) (LRB): VIDEO ASSISTED THORACOSCOPY (VATS)/EMPYEMA (Right) VIDEO BRONCHOSCOPY (Right) VIDEO BRONCHOSCOPY WITH ENDOBRONCHIAL ULTRASOUND (Right) Subjective: Patient with poor appetite, week CT scan without contrast images from yesterday personally reviewed and counseled with patient Loculated right lower pleural effusion, subcarinal mass--patient with possible empyema, thoracic malignancy, or both  Plan bronchoscopy, transbronchial biopsy of subcarinal mass followed by right VATS for drainage of loculated effusion and possible pleurodesis tomorrow  Procedure benefits and risks discussed with patient and family. Cardiac function normal by echocardiogram without pericardial effusion Objective: Vital signs in last 24 hours: Temp:  [98 F (36.7 C)-98.8 F (37.1 C)] 98.7 F (37.1 C) (01/07 1236) Pulse Rate:  [78-95] 78 (01/07 1236) Cardiac Rhythm: Atrial fibrillation (01/07 0701) Resp:  [18-19] 19 (01/07 1236) BP: (109-131)/(55-69) 109/55 (01/07 1236) SpO2:  [89 %-99 %] 89 % (01/07 1436) Weight:  [172 lb 13.5 oz (78.4 kg)] 172 lb 13.5 oz (78.4 kg) (01/07 0517)  Hemodynamic parameters for last 24 hours:  atrial fibrillation, afebrile  Intake/Output from previous day: 01/06 0701 - 01/07 0700 In: 290 [P.O.:240; IV Piggyback:50] Out: 1250 [Urine:1250] Intake/Output this shift: No intake/output data recorded.  Chronically ill-appearing middle-aged male Diminished breath sounds at right base No JVD or cervical adenopathy Irregular heart rate of atrial fibrillation  Lab Results:  Recent Labs  05/18/16 0639 05/19/16 0445  WBC 9.7 12.9*  HGB 12.2* 11.9*  HCT 35.0* 35.5*  PLT 335 350   BMET:  Recent Labs  05/19/16 0445 05/19/16 1038  NA 134* 132*  K 4.9 5.0  CL 98* 97*  CO2 28 27  GLUCOSE 104* 109*  BUN 27* 26*  CREATININE 0.79 0.84  CALCIUM 8.4* 8.5*    PT/INR:  Recent Labs  05/19/16 1038  LABPROT 14.8  INR 1.15   ABG    Component Value  Date/Time   PHART 7.509 (H) 05/19/2016 0942   HCO3 29.5 (H) 05/19/2016 0942   O2SAT 93.4 05/19/2016 0942   CBG (last 3)   Recent Labs  05/17/16 2151  GLUCAP 124*    Assessment/Plan: S/P Procedure(s) (LRB): VIDEO ASSISTED THORACOSCOPY (VATS)/EMPYEMA (Right) VIDEO BRONCHOSCOPY (Right) VIDEO BRONCHOSCOPY WITH ENDOBRONCHIAL ULTRASOUND (Right) Surgery Monday a.m.   LOS: 5 days    [name] 05/19/2016

## 2016-05-19 NOTE — Progress Notes (Addendum)
Patient ID: [name], male   DOB: 1946/04/30, 71 y.o.   MRN: [id number]  PROGRESS NOTE    [name]  IRJ:[id number] DOB: 04-03-1946 DOA: 05/13/2016  PCP: [name]   Brief Narrative:  71 year old male with past medical history of prostate cancer, tobacco use and COPD. Pt was admitted to [facility] 05/13/2016 for shortness of breath for past 1 week prior to this admission. He was found to have respiratory failure with hypoxia thought to be due to pneumonia and possible CHF. He had been previously treated for bronchopneumonia with oral Levaquin at a urgent care facility. Pt was also in atrial fibrillation and had a large right pleural effusion. He was placed on IV antibiotics. Chest CT scan demonstrated a large complex right pleural effusion with a subcarinal mass. Thoracentesis returned 1.2 L without significant improvement in chest x-ray. Cytology was negative. Cultures have been negative but the patient has been on IV Rocephin and oral azithromycin. A repeat attempt at thoracentesis removed minimal fluid due to the loculated process. Thoracic surgical consulted for VATS to drain the complex effusion/empyema.  The patient was seen by cardiology for his atrial fibrillation and echocardiogram was performed. It showed good LV function, no pericardial effusion, no significant valvular disease and mildly elevated right-sided pressures with mild TR. He was placed on IV heparin for a few days in preparation for possible bronchoscopy with biopsy and then the plan is for  NOAC for management of his atrial fibrillation.[name] He is now off the IV heparin and CTS placed him on prophylactic dose Lovenox in preparation for VATS Monday, January 8 at [facility] hospital. Peripheral edema has improved with Lasix therapy.  Patient transferred to [place] 05/18/2016  to have right VATS and bronchoscopy with biopsy on 1/8.  Assessment & Plan:   Acute respiratory failure with hypoxia secondary to obstructive pneumonia  in the setting of loculated pleural effusion and subcarinal mass / Empyema / Leukocytosis  - S/P thoracentesis 05/14/2016 yielded 1.5 L fluid, cultures so far negative, cytology non diagnostic - Repeat thoracentesis not successful due to loculated effusion - Plan for VATS and biospy in am - Continue bronchdialtors as prescribed  - Continue azithromycin and rocephin,  - Continue oxygen support via [place] to keep O2 sat above 30%  Acute diastolic CHF - LE edema has improved with lasix - 2 D ECHO showed good LV function, no pericardial effusion, no significant valvular disease and mildly elevated right-sided pressures with mild TR. - Weight 78.4 kg 1/7 and 1/6. Weight 1/4 was 79.3 kg - Continue daily weight and strict intake and output  Hyponatremia - Dehydration or CHF etiology - Sodium 132, stable overall  New onset Atrial fibrillation - CHADS vasc score 4 - Rate controlled with amiodarone - On prophylactic Lovenox but after surgery will be switched to NOAC  Hypokalemia / Hypomagnesemia - Supplemented  COPD (chronic obstructive pulmonary disease) (HCC) - Continue bronchodilators and oxygen support  Tobacco abuse - Counseled on cessation   Severe protein calorie malnutrition - In the context of chronic illness - Continue nutritional supplementation      DVT prophylaxis: Lovenox subQ Code Status: full code  Family Communication: wife at the bedside this am Disposition Plan: not yet stable for discharge, home once cleared by CTS   Consultants:  Pulmonary /critical care  Cardiology  Thoracic surgery  Procedures:  Echocardiogram done 1/2: Enlarged atria, preserved ejection fraction  Thoracentesis done 1/2 : 1.5 L fluid consistent with transudate. No malignant  cells by fluids cytology.  Antimicrobials:  IV Rocephin and Zithromax 1/1 -->  Prior to admission, patient on Levaquin 12/30-12/31    Subjective: Using incentive spirometry. No overnight events.    Objective: Vitals:   05/19/16 0006 05/19/16 0517 05/19/16 0718 05/19/16 1236  BP: 109/61 131/61  (!) 109/55  Pulse: 85 81  78  Resp: _0 Temp: 98.5 F (36.9 C) 98 F (36.7 C)  98.7 F (37.1 C)  TempSrc: Oral Oral  Oral  SpO2: 91% 98% 99% 94%  Weight:  78.4 kg (172 lb 13.5 oz)    Height: _1  (1.803 m)       Intake/Output Summary (Last 24 hours) at 05/19/16 1321 Last data filed at 05/19/16 0258  Gross per 24 hour  Intake                0 ml  Output              600 ml  Net             -600 ml   Filed Weights   05/16/16 0453 05/18/16 0544 05/19/16 0517  Weight: 79.3 kg (174 lb 13.2 oz) 78.4 kg (172 lb 13.5 oz) 78.4 kg (172 lb 13.5 oz)    Examination:  General exam: Appears calm and comfortable  Respiratory system: Diminished breath sounds but no wheezing  Cardiovascular system: S1 & S2 heard, RRR. No JVD, murmurs, rubs, gallops or clicks. No pedal edema. Gastrointestinal system: Abdomen is nondistended, soft and nontender. No organomegaly or masses felt. Normal bowel sounds heard. Central nervous system: Alert and oriented. No focal neurological deficits. Extremities: Symmetric 5 x 5 power. Skin: No rashes, lesions or ulcers Psychiatry: Judgement and insight appear normal. Mood & affect appropriate.   Data Reviewed: I have personally reviewed following labs and imaging studies  CBC:  Recent Labs Lab 05/13/16 2259 05/14/16 0347 05/15/16 0911 05/17/16 0136 05/18/16 0639 05/19/16 0445  WBC 20.2* 16.8* 12.3* 10.4 9.7 12.9*  NEUTROABS 17.0*  --   --   --   --  9.1*  HGB 13.8 12.2* 13.5 11.6* 12.2* 11.9*  HCT 39.4 35.9* 39.5 34.8* 35.0* 35.5*  MCV 87.4 88.4 88.6 89.5 88.6 89.6  PLT 359 332 331 310 335 878   Basic Metabolic Panel:  Recent Labs Lab 05/14/16 0347  05/16/16 0507 05/17/16 0136 05/18/16 0639 05/19/16 0445 05/19/16 1038  NA 131*  < > 135 137 135 134* 132*  K 4.3  < > 4.1 4.6 4.4 4.9 5.0  CL 99*  < > 101 101 96* 98* 97*  CO2 23  < >  _2 GLUCOSE 105*  < > 132* 118* 85 104* 109*  BUN 39*  < > 41* 37* 33* 27* 26*  CREATININE 1.08  < > 0.82 0.90 0.81 0.79 0.84  CALCIUM 8.1*  < > 8.1* 8.2* 8.4* 8.4* 8.5*  MG 1.9  --   --  1.7 1.6* 1.7 1.8  PHOS 4.0  --   --   --   --   --   --   < > = values in this interval not displayed. GFR: Estimated Creatinine Clearance: 84.7 mL/min (by C-G formula based on SCr of 0.84 mg/dL). Liver Function Tests:  Recent Labs Lab 05/14/16 0037 05/14/16 0347 05/14/16 1642 05/17/16 0136 05/19/16 1038  AST 38 33  --  36 28  ALT 30 30  --  34 28  ALKPHOS 218*  219*  --  251* 254*  BILITOT 1.0 1.3*  --  0.3 0.5  PROT 5.7* 5.8* 6.4* 4.9* 5.2*  ALBUMIN 2.1* 2.2*  --  1.9* 1.7*   No results for input(s): LIPASE, AMYLASE in the last 168 hours. No results for input(s): AMMONIA in the last 168 hours. Coagulation Profile:  Recent Labs Lab 05/19/16 1038  INR 1.15   Cardiac Enzymes:  Recent Labs Lab 05/14/16 0037 05/14/16 0604 05/14/16 1247 05/19/16 0445  TROPONINI 0.05* 0.05* 0.06* 0.05*   BNP (last 3 results) No results for input(s): PROBNP in the last 8760 hours. HbA1C: No results for input(s): HGBA1C in the last 72 hours. CBG:  Recent Labs Lab 05/17/16 2151  GLUCAP 124*   Lipid Profile: No results for input(s): CHOL, HDL, LDLCALC, TRIG, CHOLHDL, LDLDIRECT in the last 72 hours. Thyroid Function Tests: No results for input(s): TSH, T4TOTAL, FREET4, T3FREE, THYROIDAB in the last 72 hours. Anemia Panel: No results for input(s): VITAMINB12, FOLATE, FERRITIN, TIBC, IRON, RETICCTPCT in the last 72 hours. Urine analysis:    Component Value Date/Time   COLORURINE YELLOW 05/14/2016 0032   APPEARANCEUR CLEAR 05/14/2016 0032   LABSPEC 1.011 05/14/2016 0032   PHURINE 5.0 05/14/2016 0032   GLUCOSEU NEGATIVE 05/14/2016 0032   HGBUR NEGATIVE 05/14/2016 0032   BILIRUBINUR NEGATIVE 05/14/2016 0032   KETONESUR NEGATIVE 05/14/2016 0032   PROTEINUR 30 (A) 05/14/2016 0032    NITRITE NEGATIVE 05/14/2016 0032   LEUKOCYTESUR NEGATIVE 05/14/2016 0032   Sepsis Labs: _0 (procalcitonin:4,lacticidven:4)   Recent Results (from the past 240 hour(s))  Blood culture (routine x 2)     Status: None (Preliminary result)   Collection Time: 05/14/16 12:02 AM  Result Value Ref Range Status   Specimen Description BLOOD RIGHT ANTECUBITAL  Final   Special Requests BOTTLES DRAWN AEROBIC AND ANAEROBIC 5ML  Final   Culture   Final    NO GROWTH 4 DAYS Performed at [facility]    Report Status PENDING  Incomplete  Blood culture (routine x 2)     Status: None (Preliminary result)   Collection Time: 05/14/16 12:02 AM  Result Value Ref Range Status   Specimen Description BLOOD LEFT ANTECUBITAL  Final   Special Requests BOTTLES DRAWN AEROBIC AND ANAEROBIC 5ML  Final   Culture   Final    NO GROWTH 4 DAYS Performed at [facility]    Report Status PENDING  Incomplete  Body fluid culture     Status: None   Collection Time: 05/14/16  3:30 PM  Result Value Ref Range Status   Specimen Description PLEURAL  Final   Special Requests NONE  Final   Gram Stain   Final    MODERATE WBC PRESENT, PREDOMINANTLY MONONUCLEAR NO ORGANISMS SEEN    Culture   Final    NO GROWTH 3 DAYS Performed at [facility]    Report Status 05/18/2016 FINAL  Final  MRSA PCR Screening     Status: None   Collection Time: 05/16/16  4:33 PM  Result Value Ref Range Status   MRSA by PCR NEGATIVE NEGATIVE Final    Comment:        The GeneXpert MRSA Assay (FDA approved for NASAL specimens only), is one component of a comprehensive MRSA colonization surveillance program. It is not intended to diagnose MRSA infection nor to guide or monitor treatment for MRSA infections.   Body fluid culture     Status: None (Preliminary result)   Collection Time: 05/17/16  5:07 PM  Result Value Ref Range Status   Specimen Description PLEURAL  Final   Special Requests NONE  Final   Gram  Stain   Final    FEW WBC PRESENT,BOTH PMN AND MONONUCLEAR NO ORGANISMS SEEN    Culture   Final    NO GROWTH 2 DAYS Performed at [facility]    Report Status PENDING  Incomplete      Radiology Studies: Dg Chest 2 View Result Date: 05/17/2016 Cardiomegaly and bilateral pulmonary edema with increased right pleural effusion consistent with congestive heart failure.   Ct Chest Wo Contrast Result Date: 05/17/2016 1. Large complex right pleural effusion now appears loculated. There is a collection of gas within the posterior basilar component of the loculated right pleural effusion which is worrisome for empyema. 2. Persistent enlarged mediastinal lymph nodes and a left internal mammary lymph node. Differential considerations include metastatic adenopathy, lymphoproliferative disorder, granulomatous inflammation/infection or (less likely) reactive adenopathy.   Dg Chest Port 1 View Result Date: 05/17/2016 Again noted mild congestion/interstitial edema bilaterally. Slight improvement in aeration right lower lung. Persistent small right pleural effusion with right basilar atelectasis or infiltrate. There is no pneumothorax. Electronically Signed   By: Lahoma Crocker M.D.   On: 05/17/2016 14:50    Scheduled Meds: . amiodarone  400 mg Oral BID  . aspirin  81 mg Oral Daily  . azithromycin  500 mg Oral Daily  . cefTRIAXone   1 g Intravenous Q24H  .  cefUROXime   1.5 g Intravenous To SSTC  . docusate sodium  200 mg Oral BID  . feeding supplement   237 mL Oral TID BM  . fluticasone  1 spray Each Nare Daily  . guaiFENesin  600 mg Oral BID  . ipratropium  0.5 mg Nebulization TID  . levalbuterol  1.25 mg Nebulization TID  . nicotine  21 mg Transdermal Daily  . potassium chloride  20 mEq Oral BID  . tamsulosin  0.4 mg Oral QPC breakfast   Continuous Infusions:   LOS: 5 days    Time spent: 25 minutes  Greater than 50% of the time spent on counseling and coordinating the care.   Leisa Lenz, MD [facility] Pager [phone]  If 7PM-7AM, please contact night-coverage [email or web address] Password [email or web address] 05/19/2016, 1:21 PM

## 2016-05-20 ENCOUNTER — Inpatient Hospital Stay (HOSPITAL_COMMUNITY): Payer: Medicare Other

## 2016-05-20 ENCOUNTER — Encounter (HOSPITAL_COMMUNITY)
Admission: EM | Disposition: A | Discharge status: Home Health | Payer: Self-pay | Source: Home / Self Care | Attending: Internal Medicine

## 2016-05-20 ENCOUNTER — Encounter (HOSPITAL_COMMUNITY): Payer: Self-pay | Admitting: Certified Registered Nurse Anesthetist

## 2016-05-20 ENCOUNTER — Inpatient Hospital Stay (HOSPITAL_COMMUNITY): Payer: Medicare Other | Admitting: Certified Registered Nurse Anesthetist

## 2016-05-20 DIAGNOSIS — R599 Enlarged lymph nodes, unspecified: Secondary | ICD-10-CM

## 2016-05-20 HISTORY — PX: VIDEO BRONCHOSCOPY WITH ENDOBRONCHIAL ULTRASOUND: SHX6177

## 2016-05-20 HISTORY — PX: VIDEO ASSISTED THORACOSCOPY (VATS)/EMPYEMA: SHX6172

## 2016-05-20 LAB — BASIC METABOLIC PANEL
ANION GAP: 9 (ref 5–15)
BUN: 26 mg/dL — ABNORMAL HIGH (ref 6–20)
CALCIUM: 8.2 mg/dL — AB (ref 8.9–10.3)
CO2: 25 mmol/L (ref 22–32)
Chloride: 98 mmol/L — ABNORMAL LOW (ref 101–111)
Creatinine, Ser: 0.79 mg/dL (ref 0.61–1.24)
GFR calc non Af Amer: >60 mL/min (ref >60)
Glucose, Bld: 96 mg/dL (ref 65–99)
Potassium: 5.5 mmol/L — ABNORMAL HIGH (ref 3.5–5.1)
Sodium: 132 mmol/L — ABNORMAL LOW (ref 135–145)

## 2016-05-20 LAB — BODY FLUID CULTURE: Culture: NO GROWTH

## 2016-05-20 LAB — GLUCOSE, CAPILLARY
GLUCOSE-CAPILLARY: 123 mg/dL — AB (ref 65–99)
Glucose-Capillary: 123 mg/dL — ABNORMAL HIGH (ref 65–99)

## 2016-05-20 LAB — CBC
HCT: 32.9 % — ABNORMAL LOW (ref 39.0–52.0)
HEMATOCRIT: 35.4 % — AB (ref 39.0–52.0)
HEMOGLOBIN: 11.7 g/dL — AB (ref 13.0–17.0)
HEMOGLOBIN: 11.1 g/dL — AB (ref 13.0–17.0)
MCH: 29.9 pg (ref 26.0–34.0)
MCH: 30.7 pg (ref 26.0–34.0)
MCHC: 33.1 g/dL (ref 30.0–36.0)
MCHC: 33.7 g/dL (ref 30.0–36.0)
MCV: 90.5 fL (ref 78.0–100.0)
MCV: 91.1 fL (ref 78.0–100.0)
PLATELETS: 329 10*3/uL (ref 150–400)
Platelets: 372 10*3/uL (ref 150–400)
RBC: 3.91 MIL/uL — ABNORMAL LOW (ref 4.22–5.81)
RBC: 3.61 MIL/uL — AB (ref 4.22–5.81)
RDW: 15.3 % (ref 11.5–15.5)
RDW: 14.8 % (ref 11.5–15.5)
WBC: 13.9 10*3/uL — AB (ref 4.0–10.5)
WBC: 20.1 10*3/uL — AB (ref 4.0–10.5)

## 2016-05-20 LAB — BLOOD GAS, ARTERIAL
Acid-Base Excess: 3.2 mmol/L — ABNORMAL HIGH (ref 0.0–2.0)
Bicarbonate: 29.2 mmol/L — ABNORMAL HIGH (ref 20.0–28.0)
O2 Content: 10 L/min
O2 Saturation: 94.5 %
Patient temperature: 98.6
pCO2 arterial: 62.4 mmHg — ABNORMAL HIGH (ref 32.0–48.0)
pH, Arterial: 7.292 — ABNORMAL LOW (ref 7.350–7.450)
pO2, Arterial: 89 mmHg (ref 83.0–108.0)

## 2016-05-20 LAB — MAGNESIUM: MAGNESIUM: 1.9 mg/dL (ref 1.7–2.4)

## 2016-05-20 SURGERY — VIDEO ASSISTED THORACOSCOPY (VATS)/EMPYEMA
Anesthesia: General | Site: Chest | Laterality: Right

## 2016-05-20 MED ORDER — ONDANSETRON HCL 4 MG/2ML IJ SOLN
4.0000 mg | Freq: Four times a day (QID) | INTRAMUSCULAR | Status: DC | PRN
Start: 2016-05-20 — End: 2016-05-31

## 2016-05-20 MED ORDER — DIPHENHYDRAMINE HCL 12.5 MG/5ML PO ELIX: 12.5000 mg | ORAL_SOLUTION | Freq: Four times a day (QID) | ORAL | Status: DC | PRN

## 2016-05-20 MED ORDER — OXYCODONE HCL 5 MG PO TABS
ORAL_TABLET | ORAL | Status: AC
  Filled 2016-05-20: qty 1

## 2016-05-20 MED ORDER — OXYCODONE HCL 5 MG PO TABS
5.0000 mg | ORAL_TABLET | ORAL | Status: DC | PRN
  Administered 2016-05-20 – 2016-05-30 (×9): 5 mg via ORAL
  Filled 2016-05-20 (×9): qty 1

## 2016-05-20 MED ORDER — ONDANSETRON HCL 4 MG/2ML IJ SOLN: 4.0000 mg | Freq: Four times a day (QID) | INTRAMUSCULAR | Status: DC | PRN

## 2016-05-20 MED ORDER — SODIUM CHLORIDE 0.9 % IV BOLUS (SEPSIS)
500.0000 mL | Freq: Once | INTRAVENOUS | Status: AC
  Administered 2016-05-20: 500 mL via INTRAVENOUS

## 2016-05-20 MED ORDER — FENTANYL CITRATE (PF) 100 MCG/2ML IJ SOLN
INTRAMUSCULAR | Status: AC
  Filled 2016-05-20: qty 2

## 2016-05-20 MED ORDER — SODIUM POLYSTYRENE SULFONATE 15 GM/60ML PO SUSP
15.0000 g | Freq: Once | ORAL | Status: AC
  Administered 2016-05-20: 15 g via ORAL
  Filled 2016-05-20: qty 60

## 2016-05-20 MED ORDER — ROCURONIUM BROMIDE 100 MG/10ML IV SOLN
INTRAVENOUS | Status: DC | PRN
  Administered 2016-05-20: 20 mg via INTRAVENOUS
  Administered 2016-05-20: 30 mg via INTRAVENOUS
  Administered 2016-05-20: 20 mg via INTRAVENOUS
  Administered 2016-05-20: 30 mg via INTRAVENOUS

## 2016-05-20 MED ORDER — FENTANYL 40 MCG/ML IV SOLN
INTRAVENOUS | Status: DC
  Administered 2016-05-20: 200 ug via INTRAVENOUS
  Administered 2016-05-20: 13:00:00 via INTRAVENOUS
  Administered 2016-05-20: 170 ug via INTRAVENOUS
  Administered 2016-05-21: 100 ug via INTRAVENOUS
  Administered 2016-05-21: 80 ug via INTRAVENOUS
  Administered 2016-05-21: 70 ug via INTRAVENOUS
  Administered 2016-05-21: 20 ug via INTRAVENOUS
  Filled 2016-05-20: qty 25

## 2016-05-20 MED ORDER — PHENYLEPHRINE HCL 10 MG/ML IJ SOLN
0.0000 ug/min | INTRAVENOUS | Status: DC
  Filled 2016-05-20: qty 1

## 2016-05-20 MED ORDER — LIDOCAINE 2% (20 MG/ML) 5 ML SYRINGE
INTRAMUSCULAR | Status: AC
  Filled 2016-05-20: qty 5

## 2016-05-20 MED ORDER — TRAMADOL HCL 50 MG PO TABS
50.0000 mg | ORAL_TABLET | Freq: Four times a day (QID) | ORAL | Status: DC | PRN
  Administered 2016-05-23 – 2016-05-28 (×5): 50 mg via ORAL
  Filled 2016-05-20 (×5): qty 1

## 2016-05-20 MED ORDER — FENTANYL CITRATE (PF) 100 MCG/2ML IJ SOLN
INTRAMUSCULAR | Status: DC | PRN
  Administered 2016-05-20: 25 ug via INTRAVENOUS
  Administered 2016-05-20 (×2): 50 ug via INTRAVENOUS
  Administered 2016-05-20 (×3): 25 ug via INTRAVENOUS

## 2016-05-20 MED ORDER — PIPERACILLIN-TAZOBACTAM 3.375 G IVPB
3.3750 g | Freq: Three times a day (TID) | INTRAVENOUS | Status: DC
  Administered 2016-05-20 – 2016-05-28 (×25): 3.375 g via INTRAVENOUS
  Filled 2016-05-20 (×26): qty 50

## 2016-05-20 MED ORDER — ROCURONIUM BROMIDE 50 MG/5ML IV SOSY
PREFILLED_SYRINGE | INTRAVENOUS | Status: AC
  Filled 2016-05-20: qty 5

## 2016-05-20 MED ORDER — ALBUMIN HUMAN 5 % IV SOLN
INTRAVENOUS | Status: AC
  Administered 2016-05-20: 25 g via INTRAVENOUS
  Filled 2016-05-20: qty 500

## 2016-05-20 MED ORDER — SUGAMMADEX SODIUM 200 MG/2ML IV SOLN
INTRAVENOUS | Status: AC
  Filled 2016-05-20: qty 2

## 2016-05-20 MED ORDER — PROMETHAZINE HCL 25 MG/ML IJ SOLN: 6.2500 mg | INTRAMUSCULAR | Status: DC | PRN

## 2016-05-20 MED ORDER — ACETAMINOPHEN 10 MG/ML IV SOLN
1000.0000 mg | Freq: Once | INTRAVENOUS | Status: AC
  Administered 2016-05-20: 1000 mg via INTRAVENOUS

## 2016-05-20 MED ORDER — AMIODARONE HCL 200 MG PO TABS
400.0000 mg | ORAL_TABLET | Freq: Two times a day (BID) | ORAL | Status: DC
  Administered 2016-05-20 – 2016-05-21 (×2): 400 mg via ORAL
  Filled 2016-05-20 (×2): qty 2

## 2016-05-20 MED ORDER — INSULIN ASPART 100 UNIT/ML ~~LOC~~ SOLN
0.0000 [IU] | Freq: Four times a day (QID) | SUBCUTANEOUS | Status: DC
  Administered 2016-05-20 (×2): 2 [IU] via SUBCUTANEOUS

## 2016-05-20 MED ORDER — DIPHENHYDRAMINE HCL 50 MG/ML IJ SOLN: 12.5000 mg | Freq: Four times a day (QID) | INTRAMUSCULAR | Status: DC | PRN

## 2016-05-20 MED ORDER — FENTANYL CITRATE (PF) 100 MCG/2ML IJ SOLN
INTRAMUSCULAR | Status: AC
Start: 2016-05-20 — End: 2016-05-20
  Filled 2016-05-20: qty 2

## 2016-05-20 MED ORDER — SODIUM CHLORIDE 0.9% FLUSH: 9.0000 mL | INTRAVENOUS | Status: DC | PRN

## 2016-05-20 MED ORDER — SODIUM CHLORIDE 0.9 % IV SOLN
1500.0000 mg | Freq: Once | INTRAVENOUS | Status: AC
  Administered 2016-05-21: 1500 mg via INTRAVENOUS
  Filled 2016-05-20: qty 1500

## 2016-05-20 MED ORDER — SUCCINYLCHOLINE CHLORIDE 200 MG/10ML IV SOSY
PREFILLED_SYRINGE | INTRAVENOUS | Status: AC
  Filled 2016-05-20: qty 10

## 2016-05-20 MED ORDER — ACETAMINOPHEN 160 MG/5ML PO SOLN: 1000.0000 mg | Freq: Four times a day (QID) | ORAL | Status: AC

## 2016-05-20 MED ORDER — HYDROMORPHONE HCL 1 MG/ML IJ SOLN
0.2500 mg | INTRAMUSCULAR | Status: DC | PRN
Start: 2016-05-20 — End: 2016-05-20
  Administered 2016-05-20 (×4): 0.5 mg via INTRAVENOUS

## 2016-05-20 MED ORDER — KETOROLAC TROMETHAMINE 15 MG/ML IJ SOLN
15.0000 mg | Freq: Three times a day (TID) | INTRAMUSCULAR | Status: AC
  Administered 2016-05-20 – 2016-05-21 (×3): 15 mg via INTRAVENOUS
  Filled 2016-05-20 (×3): qty 1

## 2016-05-20 MED ORDER — ACETAMINOPHEN 10 MG/ML IV SOLN
INTRAVENOUS | Status: AC
  Filled 2016-05-20: qty 100

## 2016-05-20 MED ORDER — ACETAMINOPHEN 500 MG PO TABS
1000.0000 mg | ORAL_TABLET | Freq: Four times a day (QID) | ORAL | Status: AC
  Administered 2016-05-20 – 2016-05-25 (×16): 1000 mg via ORAL
  Filled 2016-05-20 (×18): qty 2

## 2016-05-20 MED ORDER — MIDAZOLAM HCL 2 MG/2ML IJ SOLN
INTRAMUSCULAR | Status: AC
  Filled 2016-05-20: qty 2

## 2016-05-20 MED ORDER — SUCCINYLCHOLINE CHLORIDE 20 MG/ML IJ SOLN
INTRAMUSCULAR | Status: DC | PRN
  Administered 2016-05-20: 100 mg via INTRAVENOUS

## 2016-05-20 MED ORDER — ONDANSETRON HCL 4 MG/2ML IJ SOLN
INTRAMUSCULAR | Status: DC | PRN
  Administered 2016-05-20: 4 mg via INTRAVENOUS

## 2016-05-20 MED ORDER — ALBUMIN HUMAN 5 % IV SOLN
INTRAVENOUS | Status: DC | PRN
  Administered 2016-05-20: 10:00:00 via INTRAVENOUS

## 2016-05-20 MED ORDER — HYDROMORPHONE HCL 1 MG/ML IJ SOLN
INTRAMUSCULAR | Status: AC
  Filled 2016-05-20: qty 1

## 2016-05-20 MED ORDER — SODIUM CHLORIDE 0.9 % IV SOLN
30.0000 meq | Freq: Every day | INTRAVENOUS | Status: DC | PRN
  Filled 2016-05-20: qty 15

## 2016-05-20 MED ORDER — PHENYLEPHRINE HCL 10 MG/ML IJ SOLN
INTRAVENOUS | Status: DC | PRN
  Administered 2016-05-20: 25 ug/min via INTRAVENOUS

## 2016-05-20 MED ORDER — PROPOFOL 10 MG/ML IV BOLUS
INTRAVENOUS | Status: AC
  Filled 2016-05-20: qty 20

## 2016-05-20 MED ORDER — ALBUMIN HUMAN 5 % IV SOLN
25.0000 g | Freq: Once | INTRAVENOUS | Status: AC
  Administered 2016-05-20: 25 g via INTRAVENOUS

## 2016-05-20 MED ORDER — ASPIRIN 81 MG PO CHEW
81.0000 mg | CHEWABLE_TABLET | Freq: Every day | ORAL | Status: DC
  Administered 2016-05-21 – 2016-05-31 (×11): 81 mg via ORAL
  Filled 2016-05-20 (×11): qty 1

## 2016-05-20 MED ORDER — LIDOCAINE HCL (CARDIAC) 20 MG/ML IV SOLN
INTRAVENOUS | Status: DC | PRN
  Administered 2016-05-20: 100 mg via INTRAVENOUS

## 2016-05-20 MED ORDER — ORAL CARE MOUTH RINSE
15.0000 mL | Freq: Two times a day (BID) | OROMUCOSAL | Status: DC
  Administered 2016-05-20 – 2016-05-28 (×7): 15 mL via OROMUCOSAL

## 2016-05-20 MED ORDER — 0.9 % SODIUM CHLORIDE (POUR BTL) OPTIME
TOPICAL | Status: DC | PRN
  Administered 2016-05-20: 2000 mL
  Administered 2016-05-20: 1000 mL

## 2016-05-20 MED ORDER — NALOXONE HCL 0.4 MG/ML IJ SOLN: 0.4000 mg | INTRAMUSCULAR | Status: DC | PRN

## 2016-05-20 MED ORDER — SUGAMMADEX SODIUM 200 MG/2ML IV SOLN
INTRAVENOUS | Status: DC | PRN
  Administered 2016-05-20: 150 mg via INTRAVENOUS

## 2016-05-20 MED ORDER — ONDANSETRON HCL 4 MG/2ML IJ SOLN
INTRAMUSCULAR | Status: AC
  Filled 2016-05-20: qty 2

## 2016-05-20 MED ORDER — BUPIVACAINE HCL (PF) 0.5 % IJ SOLN
INTRAMUSCULAR | Status: AC
  Filled 2016-05-20: qty 10

## 2016-05-20 MED ORDER — BISACODYL 5 MG PO TBEC
10.0000 mg | DELAYED_RELEASE_TABLET | Freq: Every day | ORAL | Status: DC
  Administered 2016-05-21 – 2016-05-28 (×8): 10 mg via ORAL
  Filled 2016-05-20 (×8): qty 2

## 2016-05-20 MED ORDER — PANTOPRAZOLE SODIUM 40 MG PO TBEC
40.0000 mg | DELAYED_RELEASE_TABLET | Freq: Every day | ORAL | Status: DC
  Administered 2016-05-20 – 2016-05-31 (×12): 40 mg via ORAL
  Filled 2016-05-20 (×12): qty 1

## 2016-05-20 MED ORDER — DEXTROSE-NACL 5-0.9 % IV SOLN
INTRAVENOUS | Status: DC
  Administered 2016-05-21 – 2016-05-22 (×2): via INTRAVENOUS

## 2016-05-20 MED ORDER — VANCOMYCIN HCL IN DEXTROSE 1-5 GM/200ML-% IV SOLN
1000.0000 mg | Freq: Two times a day (BID) | INTRAVENOUS | Status: DC
  Administered 2016-05-21 – 2016-05-23 (×5): 1000 mg via INTRAVENOUS
  Filled 2016-05-20 (×6): qty 200

## 2016-05-20 MED ORDER — EPHEDRINE 5 MG/ML INJ
INTRAVENOUS | Status: AC
  Filled 2016-05-20: qty 10

## 2016-05-20 MED ORDER — PROPOFOL 10 MG/ML IV BOLUS
INTRAVENOUS | Status: DC | PRN
  Administered 2016-05-20: 100 mg via INTRAVENOUS

## 2016-05-20 MED ORDER — FENTANYL 40 MCG/ML IV SOLN
INTRAVENOUS | Status: AC
  Filled 2016-05-20: qty 25

## 2016-05-20 MED ORDER — KETOROLAC TROMETHAMINE 30 MG/ML IJ SOLN
INTRAMUSCULAR | Status: AC
  Filled 2016-05-20: qty 1

## 2016-05-20 MED ORDER — PHENYLEPHRINE 40 MCG/ML (10ML) SYRINGE FOR IV PUSH (FOR BLOOD PRESSURE SUPPORT)
PREFILLED_SYRINGE | INTRAVENOUS | Status: AC
  Filled 2016-05-20: qty 10

## 2016-05-20 MED ORDER — MIDAZOLAM HCL 5 MG/5ML IJ SOLN
INTRAMUSCULAR | Status: DC | PRN
  Administered 2016-05-20 (×2): 1 mg via INTRAVENOUS

## 2016-05-20 MED ORDER — LACTATED RINGERS IV SOLN
INTRAVENOUS | Status: DC | PRN
  Administered 2016-05-20 (×2): via INTRAVENOUS

## 2016-05-20 MED ORDER — KETOROLAC TROMETHAMINE 30 MG/ML IJ SOLN
30.0000 mg | Freq: Once | INTRAMUSCULAR | Status: AC
  Administered 2016-05-20: 30 mg via INTRAVENOUS

## 2016-05-20 MED ORDER — DEXAMETHASONE SODIUM PHOSPHATE 10 MG/ML IJ SOLN
INTRAMUSCULAR | Status: AC
  Filled 2016-05-20: qty 1

## 2016-05-20 MED ORDER — SENNOSIDES-DOCUSATE SODIUM 8.6-50 MG PO TABS
1.0000 | ORAL_TABLET | Freq: Every day | ORAL | Status: DC
  Administered 2016-05-20 – 2016-05-26 (×7): 1 via ORAL
  Filled 2016-05-20 (×7): qty 1

## 2016-05-20 MED ORDER — LACTATED RINGERS IV SOLN
INTRAVENOUS | Status: DC | PRN
  Administered 2016-05-20: 08:00:00 via INTRAVENOUS

## 2016-05-20 SURGICAL SUPPLY — 88 items
BAG DECANTER FOR FLEXI CONT (MISCELLANEOUS) IMPLANT
BLADE SURG 11 STRL SS (BLADE) ×4 IMPLANT
BRUSH CYTOL CELLEBRITY 1.5X140 (MISCELLANEOUS) ×4 IMPLANT
CANISTER SUCTION 2500CC (MISCELLANEOUS) ×8 IMPLANT
CATH KIT ON Q 5IN SLV (PAIN MANAGEMENT) IMPLANT
CATH ROBINSON RED A/P 22FR (CATHETERS) IMPLANT
CATH THORACIC 28FR (CATHETERS) IMPLANT
CATH THORACIC 36FR (CATHETERS) IMPLANT
CATH THORACIC 36FR RT ANG (CATHETERS) ×4 IMPLANT
CONN ST 1/4X3/8  BEN (MISCELLANEOUS) ×2
CONN ST 1/4X3/8 BEN (MISCELLANEOUS) ×2 IMPLANT
CONT SPEC 4OZ CLIKSEAL STRL BL (MISCELLANEOUS) ×12 IMPLANT
COTTONBALL LRG STERILE PKG (GAUZE/BANDAGES/DRESSINGS) IMPLANT
COVER DOME SNAP 22 D (MISCELLANEOUS) ×4 IMPLANT
COVER TABLE BACK 60X90 (DRAPES) ×8 IMPLANT
DERMABOND ADVANCED (GAUZE/BANDAGES/DRESSINGS) ×2
DERMABOND ADVANCED .7 DNX12 (GAUZE/BANDAGES/DRESSINGS) ×2 IMPLANT
DRAIN CHANNEL 32F RND 10.7 FF (WOUND CARE) ×4 IMPLANT
DRAPE LAPAROSCOPIC ABDOMINAL (DRAPES) ×4 IMPLANT
DRAPE WARM FLUID 44X44 (DRAPE) ×4 IMPLANT
ELECT BLADE 4.0 EZ CLEAN MEGAD (MISCELLANEOUS) ×4
ELECT BLADE 6.5 EXT (BLADE) ×4 IMPLANT
ELECT REM PT RETURN 9FT ADLT (ELECTROSURGICAL) ×4
ELECTRODE BLDE 4.0 EZ CLN MEGD (MISCELLANEOUS) ×2 IMPLANT
ELECTRODE REM PT RTRN 9FT ADLT (ELECTROSURGICAL) ×2 IMPLANT
FORCEPS BIOP RJ4 1.8 (CUTTING FORCEPS) IMPLANT
GAUZE SPONGE 4X4 12PLY STRL (GAUZE/BANDAGES/DRESSINGS) ×4 IMPLANT
GLOVE BIO SURGEON STRL SZ7.5 (GLOVE) ×12 IMPLANT
GLOVE BIOGEL PI IND STRL 6.5 (GLOVE) ×2 IMPLANT
GLOVE BIOGEL PI INDICATOR 6.5 (GLOVE) ×2
GOWN STRL REUS W/ TWL LRG LVL3 (GOWN DISPOSABLE) ×6 IMPLANT
GOWN STRL REUS W/TWL LRG LVL3 (GOWN DISPOSABLE) ×6
KIT BASIN OR (CUSTOM PROCEDURE TRAY) ×4 IMPLANT
KIT CLEAN ENDO COMPLIANCE (KITS) ×16 IMPLANT
KIT ROOM TURNOVER OR (KITS) ×8 IMPLANT
KIT SUCTION CATH 14FR (SUCTIONS) ×4 IMPLANT
MARKER SKIN DUAL TIP RULER LAB (MISCELLANEOUS) ×8 IMPLANT
NEEDLE 22X1 1/2 (OR ONLY) (NEEDLE) IMPLANT
NEEDLE BIOPSY TRANSBRONCH 21G (NEEDLE) IMPLANT
NEEDLE BLUNT 18X1 FOR OR ONLY (NEEDLE) IMPLANT
NEEDLE EBUS SONO TIP PENTAX (NEEDLE) ×12 IMPLANT
NEEDLE HYPO 22GX1.5 SAFETY (NEEDLE) IMPLANT
NS IRRIG 1000ML POUR BTL (IV SOLUTION) ×20 IMPLANT
OIL SILICONE PENTAX (PARTS (SERVICE/REPAIRS)) ×8 IMPLANT
PACK CHEST (CUSTOM PROCEDURE TRAY) ×4 IMPLANT
PAD ARMBOARD 7.5X6 YLW CONV (MISCELLANEOUS) ×16 IMPLANT
SEALANT SURG COSEAL 4ML (VASCULAR PRODUCTS) ×4 IMPLANT
SOLUTION ANTI FOG 6CC (MISCELLANEOUS) ×8 IMPLANT
SPONGE GAUZE 4X4 12PLY STER LF (GAUZE/BANDAGES/DRESSINGS) ×4 IMPLANT
SPONGE TONSIL 1.25 RF SGL STRG (GAUZE/BANDAGES/DRESSINGS) ×4 IMPLANT
SUT CHROMIC 3 0 SH 27 (SUTURE) IMPLANT
SUT ETHILON 3 0 PS 1 (SUTURE) IMPLANT
SUT PROLENE 3 0 SH DA (SUTURE) IMPLANT
SUT PROLENE 4 0 RB 1 (SUTURE)
SUT PROLENE 4-0 RB1 .5 CRCL 36 (SUTURE) IMPLANT
SUT PROLENE 6 0 C 1 30 (SUTURE) IMPLANT
SUT SILK  1 MH (SUTURE) ×4
SUT SILK 1 MH (SUTURE) ×4 IMPLANT
SUT SILK 1 TIES 10X30 (SUTURE) IMPLANT
SUT SILK 2 0SH CR/8 30 (SUTURE) IMPLANT
SUT SILK 3 0SH CR/8 30 (SUTURE) IMPLANT
SUT VIC AB 1 CTX 18 (SUTURE) ×4 IMPLANT
SUT VIC AB 1 CTX 36 (SUTURE) ×2
SUT VIC AB 1 CTX36XBRD ANBCTR (SUTURE) ×2 IMPLANT
SUT VIC AB 2 TP1 27 (SUTURE) IMPLANT
SUT VIC AB 2-0 CT2 18 VCP726D (SUTURE) IMPLANT
SUT VIC AB 2-0 CTX 36 (SUTURE) ×4 IMPLANT
SUT VIC AB 3-0 SH 18 (SUTURE) IMPLANT
SUT VIC AB 3-0 X1 27 (SUTURE) IMPLANT
SUT VICRYL 0 UR6 27IN ABS (SUTURE) IMPLANT
SUT VICRYL 2 TP 1 (SUTURE) ×4 IMPLANT
SWAB COLLECTION DEVICE MRSA (MISCELLANEOUS) IMPLANT
SWAB CULTURE LIQ STUART DBL (MISCELLANEOUS) ×4 IMPLANT
SYR 20CC LL (SYRINGE) ×4 IMPLANT
SYR 20ML ECCENTRIC (SYRINGE) ×8 IMPLANT
SYR 5ML LUER SLIP (SYRINGE) ×8 IMPLANT
SYR CONTROL 10ML LL (SYRINGE) IMPLANT
SYSTEM SAHARA CHEST DRAIN ATS (WOUND CARE) ×4 IMPLANT
TAPE CLOTH SURG 4X10 WHT LF (GAUZE/BANDAGES/DRESSINGS) ×4 IMPLANT
TIP APPLICATOR SPRAY EXTEND 16 (VASCULAR PRODUCTS) IMPLANT
TOWEL OR 17X24 6PK STRL BLUE (TOWEL DISPOSABLE) ×4 IMPLANT
TOWEL OR 17X26 10 PK STRL BLUE (TOWEL DISPOSABLE) ×12 IMPLANT
TRAP SPECIMEN MUCOUS 40CC (MISCELLANEOUS) ×12 IMPLANT
TRAY FOLEY CATH 16FRSI W/METER (SET/KITS/TRAYS/PACK) ×4 IMPLANT
TUBE ANAEROBIC SPECIMEN COL (MISCELLANEOUS) IMPLANT
TUBE CONNECTING 20'X1/4 (TUBING) ×4
TUBE CONNECTING 20X1/4 (TUBING) ×12 IMPLANT
WATER STERILE IRR 1000ML POUR (IV SOLUTION) ×12 IMPLANT

## 2016-05-20 NOTE — Progress Notes (Signed)
The patient was examined and preop studies reviewed. There has been no change from the prior exam and the patient is ready for surgery. Plan bronch- EBUS and drainage of R empyema- loculated effusion on [address] [place]

## 2016-05-20 NOTE — Brief Op Note (Signed)
05/13/2016 - 05/20/2016  11:26 AM  PATIENT:  [name]  71 y.o. male  PRE-OPERATIVE DIAGNOSIS:  1. Enlarge mediastinal lymph nodes 2. Loculated right pleural effusion  POST-OPERATIVE DIAGNOSIS: 1. Enlarge mediastinal lymph nodes 2. Loculated right pleural effusion   PROCEDURE: VIDEO BRONCHOSCOPY WITH ENDOBRONCHIAL ULTRASOUND (Right), RIGHT VIDEO ASSISTED THORACOSCOPY (VATS), RIGHT MINI THORACOTOMY with DRAINAGE OF RIGHT PLEURAL EFFUSION/EMPYMEA  FINDINGS: Approximately 2600 cc of right pleural fluid removed.  SURGEON:  Surgeon(s) and Role:    * [name], MD - Primary  PHYSICIAN ASSISTANT: Lars Pinks PA-C  ANESTHESIA:   general  EBL:  Total I/O In: 1250 [I.V.:1000; IV Piggyback:250] Out: -   BLOOD ADMINISTERED:none  DRAINS: 36 right angled chest tube and 32 Blake drain placed in the right pleural space   SPECIMEN:  Source of Specimen:  Right pleural fluid and peel  DISPOSITION OF SPECIMEN:  Gram stain, culture, pathology  COUNTS CORRECT:  YES  DICTATION: .Dragon Dictation  PLAN OF CARE: Admit to inpatient   PATIENT DISPOSITION:  PACU - hemodynamically stable.   Delay start of Pharmacological VTE agent (>24hrs) due to surgical blood loss or risk of bleeding: yes

## 2016-05-20 NOTE — Consult Note (Signed)
PRELIMINARY PHARMACY CONSULT NOTE  Consult  :  Post Procedure Dosing of  Zosyn Indication :  Post-Bronch and Drainage of R-empyema     Patient is to receive Zosyn per pharmacy consult.  Wt 77.3 kg  Est CrCl ~ 89 ml/min.  PLAN:  1. Begin Zosyn 3.375 gm IV q 8 hours, each dose to infuse over 4 hours 2. Follow up SCr, UOP, cultures, clinical course and adjust as clinically indicated.  Thank you for allowing Pharmacy to participate in this patient's care.   Alfred Eckley, Zettie Pho,  Pharm.D.,  05/20/2016,  1:31 PM

## 2016-05-20 NOTE — Anesthesia Procedure Notes (Signed)
Central Venous Catheter Insertion Performed by: Annye Asa, anesthesiologist Start/End1/12/2016 6:49 AM, 05/20/2016 7:05 AM Preanesthetic checklist: patient identified, IV checked, site marked, risks and benefits discussed, surgical consent, monitors and equipment checked, pre-op evaluation, timeout performed and anesthesia consent Position: supine Lidocaine 1% used for infiltration and patient sedated Hand hygiene performed , maximum sterile barriers used  and Seldinger technique used Catheter size: 8 Fr Central line was placed.Double lumen Procedure performed using ultrasound guided technique. Ultrasound Notes:anatomy identified, needle tip was noted to be adjacent to the nerve/plexus identified, no ultrasound evidence of intravascular and/or intraneural injection and image(s) printed for medical record Attempts: 1 Following insertion, line sutured, dressing applied and Biopatch. Post procedure assessment: free fluid flow, no air and blood return through all ports  Patient tolerated the procedure well with no immediate complications. Additional procedure comments: CVP: Timeout, sterile prep, drape, FBP R neck.  Trendelenburg position.  1% lido local, finder and trocar RIJ 1st pass with US guidance.  2 lumen placed over J wire. Biopatch and sterile dressing on.  Patient tolerated well.  VSS.  Jenita Seashore, MD.

## 2016-05-20 NOTE — Anesthesia Procedure Notes (Signed)
Procedure Name: Intubation Date/Time: 05/20/2016 7:52 AM Performed by: Shirlyn Goltz Pre-anesthesia Checklist: Patient identified, Emergency Drugs available, Suction available and Patient being monitored Patient Re-evaluated:Patient Re-evaluated prior to inductionOxygen Delivery Method: Circle system utilized Preoxygenation: Pre-oxygenation with 100% oxygen Intubation Type: IV induction Ventilation: Mask ventilation without difficulty and Oral airway inserted - appropriate to patient size Laryngoscope size: mac 4 miller 2. Grade View: Grade II Tube type: Oral Tube size: 8.5 mm Number of attempts: 2 Airway Equipment and Method: Stylet Placement Confirmation: ETT inserted through vocal cords under direct vision,  positive ETCO2 and breath sounds checked- equal and bilateral Secured at: 22 cm Dental Injury: Bloody posterior oropharynx  Difficulty Due To: Difficulty was anticipated

## 2016-05-20 NOTE — Progress Notes (Signed)
Patient ID: [name], male   DOB: [birth year], 71 y.o.   MRN: [id number]  PROGRESS NOTE    [name]  IHK:[id number] DOB: December 28, 1945 DOA: 05/13/2016  PCP: [name]   Brief Narrative:  71 year old male with past medical history of prostate cancer, tobacco use and COPD. Pt was admitted to [facility] 05/13/2016 for shortness of breath for past 1 week prior to this admission. He was found to have respiratory failure with hypoxia thought to be due to pneumonia and possible CHF. He had been previously treated for bronchopneumonia with oral Levaquin at a urgent care facility. Pt was also in atrial fibrillation and had a large right pleural effusion. He was placed on IV antibiotics. Chest CT scan demonstrated a large complex right pleural effusion with a subcarinal mass. Thoracentesis returned 1.2 L without significant improvement in chest x-ray. Cytology was negative. Cultures have been negative but the patient has been on IV Rocephin and oral azithromycin. A repeat attempt at thoracentesis removed minimal fluid due to the loculated process. Thoracic surgical consulted for VATS to drain the complex effusion/empyema.  The patient was seen by cardiology for his atrial fibrillation and echocardiogram was performed. It showed good LV function, no pericardial effusion, no significant valvular disease and mildly elevated right-sided pressures with mild TR. He was placed on IV heparin for a few days in preparation for possible bronchoscopy with biopsy and then the plan is for  NOAC for management of his atrial fibrillation.[name] He is now off the IV heparin and CTS placed him on prophylactic dose Lovenox in preparation for VATS Monday, January 8 at [facility] hospital. Peripheral edema has improved with Lasix therapy.  Patient transferred to [place] 05/18/2016  to have right VATS and bronchoscopy with biopsy on 1/8.  Assessment & Plan:   Acute respiratory failure with hypoxia secondary to obstructive pneumonia  in the setting of loculated pleural effusion and subcarinal mass / Empyema / Leukocytosis  - S/P thoracentesis 05/14/2016 yielded 1.5 L fluid, cultures so far negative, cytology non diagnostic - Repeat thoracentesis not successful due to loculated effusion - Plan for VATS and biospy this am - Continue bronchdialtors  - Continue azithromycin and rocephin,  - Continue oxygen support via [place] to keep O2 sat above 75%  Acute diastolic CHF - LE edema has improved with lasix - 2 D ECHO showed good LV function, no pericardial effusion, no significant valvular disease and mildly elevated right-sided pressures with mild TR. - Continue daily weight and strict intake and output  Hyponatremia - Dehydration or CHF etiology - Sodium 132, stable overall - Follow up BMP in am  New onset Atrial fibrillation - CHADS vasc score 4 - Rate controlled with amiodarone - On prophylactic Lovenox but after surgery will be switched to NOAC  Hypokalemia / Hypomagnesemia - Supplemented and now potassium 5.5 - Will give dose of kayexalate after surgery - Follow up BMP in am  COPD (chronic obstructive pulmonary disease) (HCC) - Continue bronchodilators and oxygen support  Tobacco abuse - Counseled on cessation   Severe protein calorie malnutrition - In the context of chronic illness - Continue nutritional supplementation      DVT prophylaxis: Lovenox subQ Code Status: full code  Family Communication: wife at the bedside this am Disposition Plan: surgery today    Consultants:  Pulmonary /critical care  Cardiology  Thoracic surgery  Procedures:  Echocardiogram done 1/2: Enlarged atria, preserved ejection fraction  Thoracentesis done 1/2 : 1.5 L fluid consistent with  transudate. No malignant cells by fluids cytology.  Antimicrobials:  IV Rocephin and Zithromax 1/1 -->  Prior to admission, patient on Levaquin 12/30-12/31    Subjective: No overnight events.   Objective: Vitals:     05/19/16 1436 05/19/16 2001 05/19/16 2057 05/20/16 0441  BP:  98/60  103/60  Pulse:  86  78  Resp:  18  19  Temp:  98.5 F (36.9 C)  97.9 F (36.6 C)  TempSrc:  Oral  Oral  SpO2: (!) 89% 97% 93% 93%  Weight:    77.3 kg (170 lb 6.4 oz)  Height:        Intake/Output Summary (Last 24 hours) at 05/20/16 1158 Last data filed at 05/20/16 1049  Gross per 24 hour  Intake             1250 ml  Output              200 ml  Net             1050 ml   Filed Weights   05/18/16 0544 05/19/16 0517 05/20/16 0441  Weight: 78.4 kg (172 lb 13.5 oz) 78.4 kg (172 lb 13.5 oz) 77.3 kg (170 lb 6.4 oz)    Examination:  General exam: Appears calm and comfortable, no distress  Respiratory system: Diminished breath sounds but no wheezing  Cardiovascular system: S1 & S2 heard, rate controlled  Gastrointestinal system: A(+) BS, non tender abdomen  Central nervous system: No focal neurological deficits. Extremities: No edema, palpable pulses   Skin: skin is warm and dry  Psychiatry: Mood & affect appropriate.   Data Reviewed: I have personally reviewed following labs and imaging studies  CBC:  Recent Labs Lab 05/13/16 2259  05/15/16 0911 05/17/16 0136 05/18/16 0639 05/19/16 0445 05/20/16 0320  WBC 20.2*  < > 12.3* 10.4 9.7 12.9* 13.9*  NEUTROABS 17.0*  --   --   --   --  9.1*  --   HGB 13.8  < > 13.5 11.6* 12.2* 11.9* 11.7*  HCT 39.4  < > 39.5 34.8* 35.0* 35.5* 35.4*  MCV 87.4  < > 88.6 89.5 88.6 89.6 90.5  PLT 359  < > 331 310 335 350 372  < > = values in this interval not displayed. Basic Metabolic Panel:  Recent Labs Lab 05/14/16 0347  05/17/16 0136 05/18/16 0639 05/19/16 0445 05/19/16 1038 05/20/16 0320  NA 131*  < > 137 135 134* 132* 132*  K 4.3  < > 4.6 4.4 4.9 5.0 5.5*  CL 99*  < > 101 96* 98* 97* 98*  CO2 23  < > _0 GLUCOSE 105*  < > 118* 85 104* 109* 96  BUN 39*  < > 37* 33* 27* 26* 26*  CREATININE 1.08  < > 0.90 0.81 0.79 0.84 0.79  CALCIUM 8.1*  < >  8.2* 8.4* 8.4* 8.5* 8.2*  MG 1.9  --  1.7 1.6* 1.7 1.8 1.9  PHOS 4.0  --   --   --   --   --   --   < > = values in this interval not displayed. GFR: Estimated Creatinine Clearance: 88.9 mL/min (by C-G formula based on SCr of 0.79 mg/dL). Liver Function Tests:  Recent Labs Lab 05/14/16 0037 05/14/16 0347 05/14/16 1642 05/17/16 0136 05/19/16 1038  AST 38 33  --  36 28  ALT 30 30  --  34 28  ALKPHOS 218* 219*  --  251* 254*  BILITOT 1.0 1.3*  --  0.3 0.5  PROT 5.7* 5.8* 6.4* 4.9* 5.2*  ALBUMIN 2.1* 2.2*  --  1.9* 1.7*   No results for input(s): LIPASE, AMYLASE in the last 168 hours. No results for input(s): AMMONIA in the last 168 hours. Coagulation Profile:  Recent Labs Lab 05/19/16 1038  INR 1.15   Cardiac Enzymes:  Recent Labs Lab 05/14/16 0037 05/14/16 0604 05/14/16 1247 05/19/16 0445  TROPONINI 0.05* 0.05* 0.06* 0.05*   BNP (last 3 results) No results for input(s): PROBNP in the last 8760 hours. HbA1C: No results for input(s): HGBA1C in the last 72 hours. CBG:  Recent Labs Lab 05/17/16 2151  GLUCAP 124*   Lipid Profile: No results for input(s): CHOL, HDL, LDLCALC, TRIG, CHOLHDL, LDLDIRECT in the last 72 hours. Thyroid Function Tests: No results for input(s): TSH, T4TOTAL, FREET4, T3FREE, THYROIDAB in the last 72 hours. Anemia Panel: No results for input(s): VITAMINB12, FOLATE, FERRITIN, TIBC, IRON, RETICCTPCT in the last 72 hours. Urine analysis:    Component Value Date/Time   COLORURINE YELLOW 05/14/2016 0032   APPEARANCEUR CLEAR 05/14/2016 0032   LABSPEC 1.011 05/14/2016 0032   PHURINE 5.0 05/14/2016 0032   GLUCOSEU NEGATIVE 05/14/2016 0032   HGBUR NEGATIVE 05/14/2016 0032   BILIRUBINUR NEGATIVE 05/14/2016 0032   KETONESUR NEGATIVE 05/14/2016 0032   PROTEINUR 30 (A) 05/14/2016 0032   NITRITE NEGATIVE 05/14/2016 0032   LEUKOCYTESUR NEGATIVE 05/14/2016 0032   Sepsis Labs: _0 (procalcitonin:4,lacticidven:4)   Recent Results (from  the past 240 hour(s))  Blood culture (routine x 2)     Status: None   Collection Time: 05/14/16 12:02 AM  Result Value Ref Range Status   Specimen Description BLOOD RIGHT ANTECUBITAL  Final   Special Requests BOTTLES DRAWN AEROBIC AND ANAEROBIC 5ML  Final   Culture   Final    NO GROWTH 5 DAYS Performed at [facility]    Report Status 05/19/2016 FINAL  Final  Blood culture (routine x 2)     Status: None   Collection Time: 05/14/16 12:02 AM  Result Value Ref Range Status   Specimen Description BLOOD LEFT ANTECUBITAL  Final   Special Requests BOTTLES DRAWN AEROBIC AND ANAEROBIC 5ML  Final   Culture   Final    NO GROWTH 5 DAYS Performed at [facility]    Report Status 05/19/2016 FINAL  Final  Body fluid culture     Status: None   Collection Time: 05/14/16  3:30 PM  Result Value Ref Range Status   Specimen Description PLEURAL  Final   Special Requests NONE  Final   Gram Stain   Final    MODERATE WBC PRESENT, PREDOMINANTLY MONONUCLEAR NO ORGANISMS SEEN    Culture   Final    NO GROWTH 3 DAYS Performed at [facility]    Report Status 05/18/2016 FINAL  Final  MRSA PCR Screening     Status: None   Collection Time: 05/16/16  4:33 PM  Result Value Ref Range Status   MRSA by PCR NEGATIVE NEGATIVE Final    Comment:        The GeneXpert MRSA Assay (FDA approved for NASAL specimens only), is one component of a comprehensive MRSA colonization surveillance program. It is not intended to diagnose MRSA infection nor to guide or monitor treatment for MRSA infections.   Body fluid culture     Status: None   Collection Time: 05/17/16  5:07 PM  Result Value Ref Range Status  Specimen Description PLEURAL  Final   Special Requests NONE  Final   Gram Stain   Final    FEW WBC PRESENT,BOTH PMN AND MONONUCLEAR NO ORGANISMS SEEN    Culture   Final    No growth aerobically or anaerobically. Performed at [facility]    Report Status 05/20/2016 FINAL   Final      Radiology Studies: Dg Chest 2 View Result Date: 05/17/2016 Cardiomegaly and bilateral pulmonary edema with increased right pleural effusion consistent with congestive heart failure.   Ct Chest Wo Contrast Result Date: 05/17/2016 1. Large complex right pleural effusion now appears loculated. There is a collection of gas within the posterior basilar component of the loculated right pleural effusion which is worrisome for empyema. 2. Persistent enlarged mediastinal lymph nodes and a left internal mammary lymph node. Differential considerations include metastatic adenopathy, lymphoproliferative disorder, granulomatous inflammation/infection or (less likely) reactive adenopathy.   Dg Chest Port 1 View Result Date: 05/17/2016 Again noted mild congestion/interstitial edema bilaterally. Slight improvement in aeration right lower lung. Persistent small right pleural effusion with right basilar atelectasis or infiltrate. There is no pneumothorax. Electronically Signed   By: Lahoma Crocker M.D.   On: 05/17/2016 14:50    Scheduled Meds: . amiodarone  400 mg Oral BID  . aspirin  81 mg Oral Daily  . azithromycin  500 mg Oral Daily  . cefTRIAXone   1 g Intravenous Q24H  .  cefUROXime   1.5 g Intravenous To SSTC  . docusate sodium  200 mg Oral BID  . feeding supplement   237 mL Oral TID BM  . fluticasone  1 spray Each Nare Daily  . guaiFENesin  600 mg Oral BID  . ipratropium  0.5 mg Nebulization TID  . levalbuterol  1.25 mg Nebulization TID  . nicotine  21 mg Transdermal Daily  . potassium chloride  20 mEq Oral BID  . tamsulosin  0.4 mg Oral QPC breakfast   Continuous Infusions:   LOS: 6 days    Time spent: 15 minutes  Greater than 50% of the time spent on counseling and coordinating the care.   Leisa Lenz, MD [facility] Pager [phone]  If 7PM-7AM, please contact night-coverage [email or web address] Password [email or web address] 05/20/2016, 11:58 AM

## 2016-05-20 NOTE — Progress Notes (Signed)
Pharmacy Antibiotic Note  [name] is a 71 y.o. male s/p VATS (R empyema).  Pharmacy has been consulted for vancomycin dosing for surgical prophylaxis. He is also noted on Zosyn. -SCr= 0.79, CrCl ~ 90  Plan: -Vancomycin 1551m IV x1 followed by 10055mIV q12h -Will follow for length of therapy -Will follow renal function, cultures and clinical progress    Height: 5' 11" (180.3 cm) Weight: 170 lb 6.4 oz (77.3 kg) IBW/kg (Calculated) : 75.3  Temp (24hrs), Avg:98 F (36.7 C), Min:97.6 F (36.4 C), Max:98.6 F (37 C)   Recent Labs Lab 05/14/16 0037  05/17/16 0136 05/18/16 0639 05/19/16 0445 05/19/16 1038 05/20/16 0320 05/20/16 2115  WBC  --   < > 10.4 9.7 12.9*  --  13.9* 20.1*  CREATININE  --   < > 0.90 0.81 0.79 0.84 0.79  --   LATICACIDVEN 1.2  --   --   --   --   --   --   --   < > = values in this interval not displayed.  Estimated Creatinine Clearance: 88.9 mL/min (by C-G formula based on SCr of 0.79 mg/dL).    Allergies  Allergen Reactions  . Demerol [Meperidine] Other (See Comments)    UNSPECIFIED REACTION  Causes system to shutdown. ?     Antimicrobials this admission: 1/8 zosyn>> 1/8 vanc>>   Thank you for allowing pharmacy to be a part of this patient's care.  [name] 05/20/2016 11:51 PM

## 2016-05-20 NOTE — Transfer of Care (Signed)
Immediate Anesthesia Transfer of Care Note  Patient: [name]  Procedure(s) Performed: Procedure(s): VIDEO ASSISTED THORACOSCOPY (VATS) with drainage of pleural effusion (Right) VIDEO BRONCHOSCOPY WITH ENDOBRONCHIAL ULTRASOUND (Right)  Patient Location: PACU  Anesthesia Type:General  Level of Consciousness: awake, alert  and patient cooperative  Airway & Oxygen Therapy: Patient Spontanous Breathing and Patient connected to face mask oxygen  Post-op Assessment: Report given to RN and Post -op Vital signs reviewed and stable  Post vital signs: Reviewed and stable  Last Vitals:  Vitals:   05/19/16 2001 05/20/16 0441  BP: 98/60 103/60  Pulse: 86 78  Resp: 18 19  Temp: 36.9 C 36.6 C    Last Pain:  Vitals:   05/20/16 0441  TempSrc: Oral  PainSc:       Patients Stated Pain Goal: 2 (35/36/14 4315)  Complications: No apparent anesthesia complications

## 2016-05-20 NOTE — Progress Notes (Signed)
[address]       [place] [address]             [phone]      Resting comfortably  S/p Right VATS for effusion/ empyema  BP (!) 91/59 (BP Location: Left Arm)   Pulse 77   Temp 98.2 F (36.8 C)   Resp 12   Ht 5' 11" (1.803 m)   Wt 170 lb 6.4 oz (77.3 kg)   SpO2 96%   BMI 23.77 kg/m    Intake/Output Summary (Last 24 hours) at 05/20/16 1805 Last data filed at 05/20/16 1605  Gross per 24 hour  Intake             2310 ml  Output             1230 ml  Net             1080 ml   Doing well early postop  Remo Lipps C. Roxan Hockey, MD [facility] [phone]

## 2016-05-20 NOTE — Progress Notes (Signed)
Transported via stretcher to  room 36. Placed on monitor with heart rate 82 and pt is in Afib. Alert and oriented x 4. No complaints of pain or discomfort. Report given to anesthesia.                    Sheniya Garciaperez RN

## 2016-05-20 NOTE — Anesthesia Preprocedure Evaluation (Addendum)
Anesthesia Evaluation  Patient identified by MRN, date of birth, ID band Patient awake    Airway Mallampati: II  TM Distance: <3 FB Neck ROM: Limited    Dental  (+) Poor Dentition   Pulmonary COPD, Current Smoker,    + rhonchi  + decreased breath sounds      Cardiovascular  Rhythm:Irregular Rate:Tachycardia     Neuro/Psych    GI/Hepatic negative GI ROS, Neg liver ROS,   Endo/Other    Renal/GU      Musculoskeletal  (+) Arthritis ,   Abdominal   Peds  Hematology   Anesthesia Other Findings   Reproductive/Obstetrics                            Anesthesia Physical Anesthesia Plan  ASA: III  Anesthesia Plan: General   Post-op Pain Management:    Induction: Intravenous  Airway Management Planned: Oral ETT  Additional Equipment: Arterial line, CVP and Ultrasound Guidance Line Placement  Intra-op Plan:   Post-operative Plan: Extubation in OR and Possible Post-op intubation/ventilation  Informed Consent: I have reviewed the patients History and Physical, chart, labs and discussed the procedure including the risks, benefits and alternatives for the proposed anesthesia with the patient or authorized representative who has indicated his/her understanding and acceptance.     Plan Discussed with:   Anesthesia Plan Comments:         Anesthesia Quick Evaluation

## 2016-05-20 NOTE — Anesthesia Postprocedure Evaluation (Addendum)
Anesthesia Post Note  Patient: [name]  Procedure(s) Performed: Procedure(s) (LRB): VIDEO ASSISTED THORACOSCOPY (VATS) with drainage of pleural effusion (Right) VIDEO BRONCHOSCOPY WITH ENDOBRONCHIAL ULTRASOUND (Right)  Patient location during evaluation: PACU Anesthesia Type: General Level of consciousness: awake and sedated Pain management: pain level controlled Vital Signs Assessment: post-procedure vital signs reviewed and stable Respiratory status: spontaneous breathing, nonlabored ventilation, respiratory function stable and patient connected to nasal cannula oxygen Cardiovascular status: blood pressure returned to baseline and stable Postop Assessment: no signs of nausea or vomiting Anesthetic complications: no       Last Vitals:  Vitals:   05/20/16 1245 05/20/16 1300  BP: (!) 87/62 105/62  Pulse: 91   Resp: 20 14  Temp:      Last Pain:  Vitals:   05/20/16 1233  TempSrc:   PainSc: 7                  Zebastian Carico,JAMES TERRILL

## 2016-05-20 NOTE — Progress Notes (Signed)
2000 - Pt hypotensive, 79/49 via cuff @ 1945, 85/60 @ 2000. A-line positional, not correlating well. Pt is alert and oriented x 4, not symptomatic. Dr. Roxan Hockey notified, verbal orders received for 500 cc saline bolus.  2115 - Notified Dr. Roxan Hockey again of persistent hypotension; pt remains AxO x 4. Verbal orders received for 2 bottles of albumin and a CBC. Will enact orders and continue to monitor.  Sherlie Ban, RN

## 2016-05-21 ENCOUNTER — Inpatient Hospital Stay (HOSPITAL_COMMUNITY): Payer: Medicare Other

## 2016-05-21 ENCOUNTER — Encounter (HOSPITAL_COMMUNITY): Payer: Self-pay | Admitting: Cardiothoracic Surgery

## 2016-05-21 DIAGNOSIS — R0600 Dyspnea, unspecified: Secondary | ICD-10-CM

## 2016-05-21 DIAGNOSIS — J90 Pleural effusion, not elsewhere classified: Secondary | ICD-10-CM | POA: Diagnosis present

## 2016-05-21 DIAGNOSIS — J189 Pneumonia, unspecified organism: Secondary | ICD-10-CM | POA: Diagnosis present

## 2016-05-21 DIAGNOSIS — J918 Pleural effusion in other conditions classified elsewhere: Secondary | ICD-10-CM

## 2016-05-21 DIAGNOSIS — R918 Other nonspecific abnormal finding of lung field: Secondary | ICD-10-CM | POA: Diagnosis present

## 2016-05-21 DIAGNOSIS — J869 Pyothorax without fistula: Secondary | ICD-10-CM | POA: Diagnosis present

## 2016-05-21 DIAGNOSIS — J9620 Acute and chronic respiratory failure, unspecified whether with hypoxia or hypercapnia: Secondary | ICD-10-CM | POA: Diagnosis present

## 2016-05-21 LAB — BASIC METABOLIC PANEL
Anion gap: 7 (ref 5–15)
BUN: 28 mg/dL — AB (ref 6–20)
CHLORIDE: 100 mmol/L — AB (ref 101–111)
CO2: 27 mmol/L (ref 22–32)
CREATININE: 1.23 mg/dL (ref 0.61–1.24)
Calcium: 8.3 mg/dL — ABNORMAL LOW (ref 8.9–10.3)
GFR calc Af Amer: >60 mL/min (ref >60)
GFR calc non Af Amer: 57 mL/min — ABNORMAL LOW (ref >60)
Glucose, Bld: 96 mg/dL (ref 65–99)
POTASSIUM: 4.9 mmol/L (ref 3.5–5.1)
SODIUM: 134 mmol/L — AB (ref 135–145)

## 2016-05-21 LAB — CBC
HCT: 32.1 % — ABNORMAL LOW (ref 39.0–52.0)
HEMOGLOBIN: 10.6 g/dL — AB (ref 13.0–17.0)
MCH: 30 pg (ref 26.0–34.0)
MCHC: 33 g/dL (ref 30.0–36.0)
MCV: 90.9 fL (ref 78.0–100.0)
Platelets: 342 10*3/uL (ref 150–400)
RBC: 3.53 MIL/uL — AB (ref 4.22–5.81)
RDW: 14.9 % (ref 11.5–15.5)
WBC: 15.6 10*3/uL — ABNORMAL HIGH (ref 4.0–10.5)

## 2016-05-21 LAB — POCT I-STAT 3, ART BLOOD GAS (G3+)
ACID-BASE EXCESS: 2 mmol/L (ref 0.0–2.0)
Bicarbonate: 27.9 mmol/L (ref 20.0–28.0)
O2 SAT: 91 %
PH ART: 7.392 (ref 7.350–7.450)
PO2 ART: 61 mmHg — AB (ref 83.0–108.0)
Patient temperature: 97.6
TCO2: 29 mmol/L (ref 0–100)
pCO2 arterial: 45.6 mmHg (ref 32.0–48.0)

## 2016-05-21 LAB — GLUCOSE, CAPILLARY
GLUCOSE-CAPILLARY: 92 mg/dL (ref 65–99)
GLUCOSE-CAPILLARY: 112 mg/dL — AB (ref 65–99)

## 2016-05-21 LAB — ACID FAST SMEAR (AFB, MYCOBACTERIA)
Acid Fast Smear: NEGATIVE
Acid Fast Smear: NEGATIVE

## 2016-05-21 LAB — LACTIC ACID, PLASMA: Lactic Acid, Venous: 0.7 mmol/L (ref 0.5–1.9)

## 2016-05-21 LAB — CORTISOL: Cortisol, Plasma: 16.5 ug/dL

## 2016-05-21 MED ORDER — ALBUMIN HUMAN 5 % IV SOLN
12.5000 g | Freq: Once | INTRAVENOUS | Status: AC
  Administered 2016-05-21: 12.5 g via INTRAVENOUS

## 2016-05-21 MED ORDER — SODIUM CHLORIDE 0.9% FLUSH
10.0000 mL | INTRAVENOUS | Status: DC | PRN
  Administered 2016-05-27: 10 mL
  Filled 2016-05-21: qty 40

## 2016-05-21 MED ORDER — ALBUMIN HUMAN 5 % IV SOLN
INTRAVENOUS | Status: AC
  Administered 2016-05-21: 12.5 g via INTRAVENOUS
  Filled 2016-05-21: qty 250

## 2016-05-21 MED ORDER — CALCIUM CHLORIDE 10 % IV SOLN
INTRAVENOUS | Status: AC
  Administered 2016-05-21: 1 g via INTRAVENOUS
  Filled 2016-05-21: qty 10

## 2016-05-21 MED ORDER — ALBUMIN HUMAN 5 % IV SOLN
25.0000 g | Freq: Once | INTRAVENOUS | Status: AC
Start: 2016-05-21 — End: 2016-05-21
  Administered 2016-05-21: 25 g via INTRAVENOUS
  Filled 2016-05-21: qty 250

## 2016-05-21 MED ORDER — PHENYLEPHRINE HCL 10 MG/ML IJ SOLN
0.0000 ug/min | INTRAVENOUS | Status: DC
  Administered 2016-05-21: 5 ug/min via INTRAVENOUS
  Filled 2016-05-21: qty 1

## 2016-05-21 MED ORDER — LEVALBUTEROL HCL 0.63 MG/3ML IN NEBU
0.6300 mg | INHALATION_SOLUTION | RESPIRATORY_TRACT | Status: DC | PRN
  Administered 2016-05-23 (×4): 0.63 mg via RESPIRATORY_TRACT
  Filled 2016-05-21 (×4): qty 3

## 2016-05-21 MED ORDER — SODIUM CHLORIDE 0.9% FLUSH
10.0000 mL | Freq: Two times a day (BID) | INTRAVENOUS | Status: DC
  Administered 2016-05-22: 10 mL
  Administered 2016-05-22: 40 mL
  Administered 2016-05-23 – 2016-05-27 (×7): 10 mL
  Administered 2016-05-27: 20 mL
  Administered 2016-05-28 – 2016-05-29 (×2): 10 mL

## 2016-05-21 MED ORDER — DOPAMINE-DEXTROSE 3.2-5 MG/ML-% IV SOLN
3.0000 ug/kg/min | INTRAVENOUS | Status: DC
  Administered 2016-05-21: 3 ug/kg/min via INTRAVENOUS
  Filled 2016-05-21 (×2): qty 250

## 2016-05-21 MED ORDER — CALCIUM CHLORIDE 10 % IV SOLN
1.0000 g | Freq: Once | INTRAVENOUS | Status: AC
  Administered 2016-05-21: 1 g via INTRAVENOUS

## 2016-05-21 MED ORDER — ENOXAPARIN SODIUM 30 MG/0.3ML ~~LOC~~ SOLN
30.0000 mg | SUBCUTANEOUS | Status: DC
  Administered 2016-05-21: 30 mg via SUBCUTANEOUS
  Filled 2016-05-21: qty 0.3

## 2016-05-21 MED ORDER — METOCLOPRAMIDE HCL 5 MG/ML IJ SOLN
10.0000 mg | Freq: Four times a day (QID) | INTRAMUSCULAR | Status: DC
  Administered 2016-05-21 – 2016-05-28 (×24): 10 mg via INTRAVENOUS
  Filled 2016-05-21 (×23): qty 2

## 2016-05-21 MED ORDER — SODIUM CHLORIDE 0.9 % IV BOLUS (SEPSIS)
500.0000 mL | Freq: Once | INTRAVENOUS | Status: AC
  Administered 2016-05-21: 500 mL via INTRAVENOUS

## 2016-05-21 MED ORDER — IPRATROPIUM BROMIDE 0.02 % IN SOLN
0.5000 mg | Freq: Four times a day (QID) | RESPIRATORY_TRACT | Status: DC
  Administered 2016-05-21 – 2016-05-27 (×21): 0.5 mg via RESPIRATORY_TRACT
  Filled 2016-05-21 (×23): qty 2.5

## 2016-05-21 MED ORDER — FUROSEMIDE 10 MG/ML IJ SOLN
20.0000 mg | Freq: Every day | INTRAMUSCULAR | Status: DC
  Administered 2016-05-22: 20 mg via INTRAVENOUS
  Filled 2016-05-21 (×2): qty 2

## 2016-05-21 MED ORDER — ONDANSETRON HCL 4 MG/2ML IJ SOLN: 4.0000 mg | Freq: Four times a day (QID) | INTRAMUSCULAR | Status: DC | PRN

## 2016-05-21 MED ORDER — LACTULOSE 10 GM/15ML PO SOLN
20.0000 g | Freq: Once | ORAL | Status: AC
  Administered 2016-05-21: 20 g via ORAL
  Filled 2016-05-21: qty 30

## 2016-05-21 MED ORDER — ONDANSETRON HCL 4 MG/2ML IJ SOLN: 4.0000 mg | Freq: Four times a day (QID) | INTRAMUSCULAR | Status: DC

## 2016-05-21 MED ORDER — FENTANYL 40 MCG/ML IV SOLN
INTRAVENOUS | Status: DC
  Administered 2016-05-21: 80 ug via INTRAVENOUS
  Administered 2016-05-21: 16:00:00 via INTRAVENOUS
  Administered 2016-05-21: 120 ug via INTRAVENOUS
  Administered 2016-05-22: 180 ug via INTRAVENOUS
  Administered 2016-05-22: 40 ug via INTRAVENOUS
  Administered 2016-05-22: 80 ug via INTRAVENOUS
  Administered 2016-05-22: 18:00:00 via INTRAVENOUS
  Administered 2016-05-22: 220 ug via INTRAVENOUS
  Administered 2016-05-22: 180 ug via INTRAVENOUS
  Administered 2016-05-22: 108.5 ug via INTRAVENOUS
  Administered 2016-05-23: 100 ug via INTRAVENOUS
  Administered 2016-05-23: 80 ug via INTRAVENOUS
  Filled 2016-05-21 (×2): qty 25

## 2016-05-21 MED ORDER — FUROSEMIDE 10 MG/ML IJ SOLN: 20.0000 mg | Freq: Two times a day (BID) | INTRAMUSCULAR | Status: DC

## 2016-05-21 MED ORDER — FUROSEMIDE 10 MG/ML IJ SOLN
20.0000 mg | Freq: Once | INTRAMUSCULAR | Status: AC
Start: 2016-05-21 — End: 2016-05-21
  Administered 2016-05-21: 20 mg via INTRAVENOUS
  Filled 2016-05-21: qty 2

## 2016-05-21 NOTE — Consult Note (Addendum)
Name: [name] MRN: [id number] DOB: February 13, 1946    ADMISSION DATE:  05/13/2016 CONSULTATION DATE:  05/21/16  REFERRING MD :  Darcey Nora  CHIEF COMPLAINT:  Hypotension   HISTORY OF PRESENT ILLNESS:  [name] is a 71 y.o. male with a PMH as outlined below.  He was admitted to [facility] 05/14/16 with SOB.  He had CTA of the chest which demonstrated subcarinal mass and large right sided pleural effusion.  He was seen by PCCM there and had thoracentesis on 01/02 with 1530m removed and then again on 01/05 with 1250mremoved (unable to remove more due to significant fibrinous septations).  Fluid was consistent with complicated parapneumonic process, cytology negative.  [facility] and on 05/20/16, he was taken to the OR for VATS and bx of subcarinal mass.  He tolerated procedure well but later that night developed hypotension that did not respond to 500cc fluid as well as 2 rounds of albumin.  He was started on low dose dopamine with good response in BP (SBP now in 120's).   Due to hypotension and need for dopamine, PCCM was re-consulted 05/21/16 for assistance.  PAST MEDICAL HISTORY :   has a past medical history of Arthritis; [place]itis [place] Const[place]; COPD (chronic obstructive pulmonary disease) (HCC); DDD (degenerative disc disease), cervical; Edema of left lower extremity; Facial basal cell cancer; H/O acne vulgaris (1960s); Headache; Pneumonia (1960s; 2015 X 2); Prostate cancer ([place] and Skin cancer.  has a past surgical history that includes Prostate biopsy; Colonoscopy; Inguinal hernia repair (Right, 2002); Inguinal hernia repair (Bilateral, 08/15/2015); Tonsillectomy (1950s); Excisional hemorrhoidectomy (1960s); Inguinal hernia repair (Bilateral, 08/15/2015); Insertion of mesh (Bilateral, 08/15/2015); Video assisted thoracoscopy (vats)/empyema (Right, 05/20/2016); and Video bronchoscopy with endobronchial ultrasound (Right,  05/20/2016). Prior to Admission medications   Medication Sig Start Date End Date Taking? Authorizing Provider  Ascorbic Acid (VITAMIN C) 1000 MG tablet Take 1,000 mg by mouth 2 (two) times daily.   Yes Historical Provider, MD  aspirin 81 MG tablet Take 81 mg by mouth daily. Reported on 05/08/2015   Yes Historical Provider, MD  fluocinonide-emollient (LIDEX-E) 0.05 % cream Apply 1 application topically daily as needed (for skin).  01/06/14  Yes Historical Provider, MD  ibuprofen (ADVIL,MOTRIN) 200 MG tablet Take 800 mg by mouth 2 (two) times daily as needed for moderate pain (for arthritiis).   Yes Historical Provider, MD  levofloxacin (LEVAQUIN) 750 MG tablet Take 750 mg by mouth daily. x5 days 05/11/16  Yes Historical Provider, MD  oxyCODONE-acetaminophen (PERCOCET/ROXICET) 5-325 MG tablet Take 1-2 tablets by mouth every 4 (four) hours as needed for moderate pain. Patient not taking: Reported on 05/13/2016 08/16/15   HaFanny SkatesMD  tamsulosin ([facility].4 MG CAPS capsule Take 1 capsule (0.4 mg total) by mouth daily after breakfast. Patient not taking: Reported on 05/13/2016 08/16/15   HaFanny SkatesMD   Allergies  Allergen Reactions  . Demerol [Meperidine] Other (See Comments)    UNSPECIFIED REACTION  Causes system to shutdown. ?     FAMILY HISTORY:  family history includes Alcoholism in his father; CAD in his father; Ovarian cancer in his sister. SOCIAL HISTORY:  reports that he has been smoking Cigarettes.  He has a 62.00 pack-year smoking history. He has never used smokeless tobacco. He reports that he does not drink alcohol or use drugs.  REVIEW OF SYSTEMS:   All negative; except for those that are bolded, which indicate positives.  Constitutional: weight loss, weight gain, night sweats, fevers, chills, fatigue, weakness.  HEENT: headaches, sore throat, sneezing, nasal congestion, post nasal drip, difficulty swallowing, tooth/dental problems, visual complaints, visual changes, ear  aches. Neuro: difficulty with speech, weakness, numbness, ataxia. CV:  chest pain, orthopnea, PND, swelling in lower extremities, dizziness, palpitations, syncope.  Resp: cough, hemoptysis, dyspnea, wheezing. GI: heartburn, indigestion, abdominal pain, nausea, vomiting, diarrhea, constipation, change in bowel habits, loss of appetite, hematemesis, melena, hematochezia.  GU: dysuria, change in color of urine, urgency or frequency, flank pain, hematuria. MSK: joint pain or swelling, decreased range of motion, right back pain. Psych: change in mood or affect, depression, anxiety, suicidal ideations, homicidal ideations. Skin: rash, itching, bruising.    SUBJECTIVE:  Dyspnea improved.  Feels fatigued after getting out of bed and into recliner. Overall in good spirits.  Has been on 48mg dopamine and SBP now in 120's.  VITAL SIGNS: Temp:  [97.6 F (36.4 C)-98.6 F (37 C)] 97.7 F (36.5 C) (01/09 0859) Pulse Rate:  [55-102] 73 (01/09 1000) Resp:  [9-27] 13 (01/09 1000) BP: (71-116)/(45-81) 102/58 (01/09 1000) SpO2:  [83 %-100 %] 96 % (01/09 1000) Arterial Line BP: (58-140)/(35-106) 122/46 (01/09 1000) Weight:  [180 lb 1.9 oz (81.7 kg)] 180 lb 1.9 oz (81.7 kg) (01/09 0600)  PHYSICAL EXAMINATION: General: Elderly appearing male, resting in recliner, in NAD. Neuro: A&O x 3, non-focal.  HEENT: [place]/AT. PERRL, sclerae anicteric. Cardiovascular: IRIR, no M/R/G.  Lungs: Respirations even and unlabored.  Coarse bilaterally R > L.  Right sided chest tube with + air leak, bloody drainage. Abdomen: BS x 4, soft, NT/ND.  Musculoskeletal: No gross deformities, no edema.  Skin: Intact, warm, no rashes.     Recent Labs Lab 05/19/16 1038 05/20/16 0320 05/21/16 0410  NA 132* 132* 134*  K 5.0 5.5* 4.9  CL 97* 98* 100*  CO2 _0 BUN 26* 26* 28*  CREATININE 0.84 0.79 1.23  GLUCOSE 109* 96 96    Recent Labs Lab 05/20/16 0320 05/20/16 2115 05/21/16 0410  HGB 11.7* 11.1* 10.6*  HCT  35.4* 32.9* 32.1*  WBC 13.9* 20.1* 15.6*  PLT 372 329 342   Dg Chest Port 1 View  Result Date: 05/21/2016 CLINICAL DATA:  Status post VATS yesterday for empyema drainage EXAM: PORTABLE CHEST 1 VIEW COMPARISON:  Portable chest x-ray of May 20, 2016 FINDINGS: The lungs remain well-expanded. The interstitial markings are diffusely increased and more conspicuous today. The left hemidiaphragm remains obscured. The right hemidiaphragm extends below the inferior margin of the image. There is no pneumothorax. Two right-sided chest tubes remain and are in stable position. The cardiac silhouette is mildly enlarged. The pulmonary vascularity is engorged and indistinct. There is calcification in the wall of the aortic arch. The right internal jugular venous catheter tip projects over the midportion of the SVC. IMPRESSION: Mild interval worsening in the appearance of the pulmonary interstitium bilaterally. This may reflect worsening pulmonary edema or interstitial pneumonia. No pneumothorax nor significant pleural effusion. The support tubes are in stable position. Thoracic aortic atherosclerosis. Electronically Signed   By: David  JMartiniqueM.D.   On: 05/21/2016 07:06   Dg Chest Port 1 View  Result Date: 05/20/2016 CLINICAL DATA:  Status post VATS and drainage of loculated right pleural effusion/empyema. EXAM: PORTABLE CHEST 1 VIEW COMPARISON:  CT of the chest on 05/17/2016 FINDINGS: Angled right-sided basilar chest tube and additional straight chest tube are present. There is improved aeration of the right lung after drainage of  right pleural fluid. Underlying chronic lung disease and interstitial prominence remains bilaterally. No pneumothorax identified. Stable cardiac enlargement. Right jugular central line present with the catheter tip in the SVC. IMPRESSION: Improved aeration of the right lung following VATS and drainage of right pleural fluid. Angled and straight right-sided chest tubes are present. No visualized  pneumothorax. Central line also present with the catheter tip in the SVC. Electronically Signed   By: Aletta Edouard M.D.   On: 05/20/2016 14:05    STUDIES:  CTA chest 01/02 > no PE.  Large right sided effusion, small left. 6.9cm subcarinal mass with mod mediastinal adenopathy.  SIGNIFICANT EVENTS  01/01 > admit. 01/02 > cards consult for A.fib > started on heparin 01/03. 01/02 > right thora with 1526m removed > exudative / parapneumonic. 01/05 > right thora with 1240m> thick septations noted on [place] referred for VATS. 01/08 > VATS and bx of subcarinal mass.  Hypotensive that night > started on low dose dopamine. 01/09 > PCCM re-consulted.  ASSESSMENT / PLAN:  Acute hypoxic respiratory failure - multifactorial in setting of large right parapneumonic effusion (s/p VATS 01/08), COPD, atelectasis. Large right parapneumonic pleural effusion - s/p thora 01/02 and 01/05, then referred to CVTS and is now s/p VATS 01/08. Subcarinal mass - s/p biopsy 01/08. COPD per report - no PFT's in system. PAH - PAP 43 per echo 01/02. Tobacco dependence. Plan: Continue supplemental O2 as needed to maintain SpO2 > 92%. Continue empiric abx and follow cultures. Post op / chest tube management per CVTS. Follow pathology results. Continue BD's. Mobilize as able. Incentive spirometry / flutter valve. Continue nicotine patch. Tobacco cessation counseling.  Shock - likely combo hypovolemic and septic.  Of note, he reports BP runs borderline low normally but unable to give range.  Notes since admission all show SBP 90's to ~110. Plan: Additional albumin dose now. Follow CVP's. Wean dopamine to off as able (would hope can get off today). Would accept SBP > 90 if mental status remains normal.  New A.fib. 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 Plan: Continue lovenox - defer to CVTS on dosing and if can increase to treatment dose (vs prophylactic). Continue amio. Transition to NOAC when OK  with CVTS. Hold lasix due to hypotension.    RaMontey HoraPAWashingtonulmonary & Critical Care Medicine Pager: [phone] ([phone]/01/2017, 10:24 AM   STAFF NOTE: I, DaMerrie RoofMD FACP have personally reviewed patient's available data, including medical history, events of note, physical examination and test results as part of my evaluation. I have discussed with resident/NP and other care providers such as pharmacist, RN and RRT. In addition, I personally evaluated patient and elicited key findings of:  Awake, follows commands, no distress, chest tubes in place, leak present, rt lung on pcxr is improved, did also have left effusion on initial CT, no fevers, he runs low at baseline, on low dose dopamine 3 mic/kg./min, does appear toxic, would recommend: albumin x 1 assess response, cortisol, low threshold to evaluate left chest with [place] repeat CT, maintain vanc, zosyn, may need empiric stress roids, need to lower his MAp goal 55 sys 90 given he runs low at baseline, likely dopamine will come off fast with these goals, may add empiric stress roids, may need cvp assessment, if he is not off pressors today I will take him on our service, also if remains in shock will add  vasopressin for presumed vasopressin deficiency  The patient is critically ill with multiple organ systems failure and requires high complexity decision making for assessment and support, frequent evaluation and titration of therapies, application of advanced monitoring technologies and extensive interpretation of multiple databases.   Critical Care Time devoted to patient care services described in this note is 30 Minutes. This time reflects time of care of this signee: Merrie Roof, MD FACP. This critical care time does not reflect procedure time, or teaching time or supervisory time of PA/NP/Med student/Med Resident etc but could involve care discussion time. Rest per NP/medical resident whose note  is outlined above and that I agree with   Lavon Paganini. Titus Mould, MD, [place] Pgr: [place] [facility] 05/21/2016 11:59 AM

## 2016-05-21 NOTE — Progress Notes (Signed)
Pt remains hypotensive despite 2 albumins. Dr. Roxan Hockey notified, received order to monitor CVP. Called Dr. Roxan Hockey back with pt CVP ranging 6-8. Verbal order received for 500 cc saline bolus, 1 albumin with amp of calcium chloride, and dopamine infusion of 3 mcg/kg/min. Will implement orders and continue to monitor.  Sherlie Ban, RN

## 2016-05-21 NOTE — Progress Notes (Addendum)
Patient ID: [name], male   DOB: Jul 21, 1945, 71 y.o.   MRN: [id number]  PROGRESS NOTE    [name]  YHC:[id number] DOB: 1945-11-26 DOA: 05/13/2016  PCP: [name]   Brief Narrative:  71 year old male with past medical history of prostate cancer, tobacco use and COPD. Pt was admitted to [facility] 05/13/2016 for shortness of breath for past 1 week prior to this admission. He was found to have respiratory failure with hypoxia thought to be due to pneumonia and possible CHF. He had been previously treated for bronchopneumonia with oral Levaquin at a urgent care facility. Pt was also in atrial fibrillation and had a large right pleural effusion. He was placed on IV antibiotics. Chest CT scan demonstrated a large complex right pleural effusion with a subcarinal mass. Thoracentesis returned 1.2 L without significant improvement in chest x-ray. Cytology was negative. Cultures have been negative but the patient has been on IV Rocephin and oral azithromycin. A repeat attempt at thoracentesis removed minimal fluid due to the loculated process. Thoracic surgical consulted for VATS to drain the complex effusion/empyema.  The patient was seen by cardiology for his atrial fibrillation and echocardiogram was performed. It showed good LV function, no pericardial effusion, no significant valvular disease and mildly elevated right-sided pressures with mild TR. He was placed on IV heparin for a few days in preparation for possible bronchoscopy with biopsy and then the plan is for  NOAC for management of his atrial fibrillation.[name] He is now off the IV heparin and CTS placed him on prophylactic dose Lovenox in preparation for VATS Monday, January 8 at [facility] hospital. Peripheral edema has improved with Lasix therapy.  Patient transferred to [place] 05/18/2016  to have right VATS and bronchoscopy with biopsy on 1/8.  Pt underwent VATS and bx of subcarinal mass 05/20/2016. He tolerated the procedure will but later  on developed hypotension which did not improve with fluid challenge and albumin. He was started on dopamine subsequently.   Assessment & Plan:   Acute respiratory failure with hypoxia secondary to right parapneumonic effusion / empyema / leukocytosis - S/P thoracentesis 05/14/2016 yielded 1.5 L fluid, cultures so far negative, cytology non diagnostic - Repeat thoracentesis not successful due to loculated effusion - S/p VATS and endobronchial biopsy of subcarinal mass and drainage of loculated right pleural effusion 05/20/2016  - Has right side chest tube - Continue supplemental oxygen - Continue bronchdialtors  - Stopped azithromycin and Rocephin and started vanco and zosyn 1/9  Shock, septic and hypovolemic - After VATS - Requires dopamine for pressor support - Appreciate pulmonary assistance  Subcarinal mass - S/P biopsy as noted above - Follow up results   Acute diastolic CHF - LE edema has improved with lasix - 2 D ECHO showed good LV function, no pericardial effusion, no significant valvular disease and mildly elevated right-sided pressures with mild TR. - Continue daily weight and strict intake and output  Hyponatremia - Dehydration or CHF etiology - Sodium 134, stable overall  New onset Atrial fibrillation - CHADS vasc score 4 - Rate controlled with amiodarone - Was on prophylactic Lovenox but after surgery will be switched to NOAC; per CTS - Currently on SCD's  Hypokalemia / Hypomagnesemia / Hyperkalemia  - Supplemented and now potassium 5.5 - Given dose of kayexalate after surgery 1/8 - Potassium WNL  COPD (chronic obstructive pulmonary disease) (HCC) - Continue duoneb every 6 hours scheduled and xopenex every 3 hours PRN shortness of breath or wheezing  Tobacco abuse - Counseled on cessation - Nicotine patch ordered    Severe protein calorie malnutrition - In the context of chronic illness - Continue nutritional supplementation    BPH - Continue Flomax    DVT prophylaxis: SCD's bilaterally  Code Status: full code  Family Communication: wife at the bedside 1/8 Disposition Plan:remains in SDU as he requires pressor support    Consultants:  Pulmonary /critical care - re consulted 1/9 for hypotension   Cardiology  Thoracic surgery  Procedures:  Echocardiogram done 1/2: Enlarged atria, preserved ejection fraction  Thoracentesis done 1/2 : 1.5 L fluid consistent with transudate. No malignant cells by fluids cytology.  VATS and endobronchial biopsy of subcarinal mass and drainage of loculated right pleural effusion 05/20/2016   Antimicrobials:  IV Rocephin and Zithromax 1/1 --> 05/21/2016  Vanco and zosyn 05/21/2016 -->  Prior to admission, patient on Levaquin 12/30-12/31    Subjective: No overnight events.   Objective: Vitals:   05/21/16 0800 05/21/16 0859 05/21/16 0900 05/21/16 1000  BP: 106/60  116/81 (!) 102/58  Pulse: 69  86 73  Resp: _0 Temp:  97.7 F (36.5 C)    TempSrc:  Oral    SpO2: 95%  93% 96%  Weight:      Height:        Intake/Output Summary (Last 24 hours) at 05/21/16 1052 Last data filed at 05/21/16 1000  Gross per 24 hour  Intake          5290.73 ml  Output             1685 ml  Net          3605.73 ml   Filed Weights   05/19/16 0517 05/20/16 0441 05/21/16 0600  Weight: 78.4 kg (172 lb 13.5 oz) 77.3 kg (170 lb 6.4 oz) 81.7 kg (180 lb 1.9 oz)    Examination:  General exam: Appears calm and comfortable, no distress  Respiratory system: right side chest tube, diminished breath sounds  Cardiovascular system: S1 & S2 heard, rate controlled  Gastrointestinal system: (+) BS, non tender abdomen  Central nervous system: No focal deficits  Extremities: No edema, palpable pulses   Skin: skin is warm and dry, no lesions or ulcers   Psychiatry: Normal mood and behavior   Data Reviewed: I have personally reviewed following labs and imaging studies  CBC:  Recent Labs Lab 05/18/16 0639  05/19/16 0445 05/20/16 0320 05/20/16 2115 05/21/16 0410  WBC 9.7 12.9* 13.9* 20.1* 15.6*  NEUTROABS  --  9.1*  --   --   --   HGB 12.2* 11.9* 11.7* 11.1* 10.6*  HCT 35.0* 35.5* 35.4* 32.9* 32.1*  MCV 88.6 89.6 90.5 91.1 90.9  PLT 335 350 372 329 841   Basic Metabolic Panel:  Recent Labs Lab 05/17/16 0136 05/18/16 0639 05/19/16 0445 05/19/16 1038 05/20/16 0320 05/21/16 0410  NA 137 135 134* 132* 132* 134*  K 4.6 4.4 4.9 5.0 5.5* 4.9  CL 101 96* 98* 97* 98* 100*  CO2 _1 GLUCOSE 118* 85 104* 109* 96 96  BUN 37* 33* 27* 26* 26* 28*  CREATININE 0.90 0.81 0.79 0.84 0.79 1.23  CALCIUM 8.2* 8.4* 8.4* 8.5* 8.2* 8.3*  MG 1.7 1.6* 1.7 1.8 1.9  --    GFR: Estimated Creatinine Clearance: 57.8 mL/min (by C-G formula based on SCr of 1.23 mg/dL). Liver Function Tests:  Recent Labs Lab 05/14/16 1642 05/17/16 0136 05/19/16 1038  AST  --  36 28  ALT  --  34 28  ALKPHOS  --  251* 254*  BILITOT  --  0.3 0.5  PROT 6.4* 4.9* 5.2*  ALBUMIN  --  1.9* 1.7*   No results for input(s): LIPASE, AMYLASE in the last 168 hours. No results for input(s): AMMONIA in the last 168 hours. Coagulation Profile:  Recent Labs Lab 05/19/16 1038  INR 1.15   Cardiac Enzymes:  Recent Labs Lab 05/14/16 1247 05/19/16 0445  TROPONINI 0.06* 0.05*   BNP (last 3 results) No results for input(s): PROBNP in the last 8760 hours. HbA1C: No results for input(s): HGBA1C in the last 72 hours. CBG:  Recent Labs Lab 05/17/16 2151 05/20/16 1749 05/20/16 2313 05/21/16 0504  GLUCAP 124* 123* 123* 92   Lipid Profile: No results for input(s): CHOL, HDL, LDLCALC, TRIG, CHOLHDL, LDLDIRECT in the last 72 hours. Thyroid Function Tests: No results for input(s): TSH, T4TOTAL, FREET4, T3FREE, THYROIDAB in the last 72 hours. Anemia Panel: No results for input(s): VITAMINB12, FOLATE, FERRITIN, TIBC, IRON, RETICCTPCT in the last 72 hours. Urine analysis:    Component Value Date/Time    COLORURINE YELLOW 05/14/2016 0032   APPEARANCEUR CLEAR 05/14/2016 0032   LABSPEC 1.011 05/14/2016 0032   PHURINE 5.0 05/14/2016 0032   GLUCOSEU NEGATIVE 05/14/2016 0032   HGBUR NEGATIVE 05/14/2016 0032   BILIRUBINUR NEGATIVE 05/14/2016 0032   KETONESUR NEGATIVE 05/14/2016 0032   PROTEINUR 30 (A) 05/14/2016 0032   NITRITE NEGATIVE 05/14/2016 0032   LEUKOCYTESUR NEGATIVE 05/14/2016 0032   Sepsis Labs: _0 (procalcitonin:4,lacticidven:4)   Recent Results (from the past 240 hour(s))  Blood culture (routine x 2)     Status: None   Collection Time: 05/14/16 12:02 AM  Result Value Ref Range Status   Specimen Description BLOOD RIGHT ANTECUBITAL  Final   Special Requests BOTTLES DRAWN AEROBIC AND ANAEROBIC 5ML  Final   Culture   Final    NO GROWTH 5 DAYS Performed at [facility]    Report Status 05/19/2016 FINAL  Final  Blood culture (routine x 2)     Status: None   Collection Time: 05/14/16 12:02 AM  Result Value Ref Range Status   Specimen Description BLOOD LEFT ANTECUBITAL  Final   Special Requests BOTTLES DRAWN AEROBIC AND ANAEROBIC 5ML  Final   Culture   Final    NO GROWTH 5 DAYS Performed at [facility]    Report Status 05/19/2016 FINAL  Final  Body fluid culture     Status: None   Collection Time: 05/14/16  3:30 PM  Result Value Ref Range Status   Specimen Description PLEURAL  Final   Special Requests NONE  Final   Gram Stain   Final    MODERATE WBC PRESENT, PREDOMINANTLY MONONUCLEAR NO ORGANISMS SEEN    Culture   Final    NO GROWTH 3 DAYS Performed at [facility]    Report Status 05/18/2016 FINAL  Final  MRSA PCR Screening     Status: None   Collection Time: 05/16/16  4:33 PM  Result Value Ref Range Status   MRSA by PCR NEGATIVE NEGATIVE Final    Comment:        The GeneXpert MRSA Assay (FDA approved for NASAL specimens only), is one component of a comprehensive MRSA colonization surveillance program. It is not intended to  diagnose MRSA infection nor to guide or monitor treatment for MRSA infections.   Body fluid culture     Status: None  Collection Time: 05/17/16  5:07 PM  Result Value Ref Range Status   Specimen Description PLEURAL  Final   Special Requests NONE  Final   Gram Stain   Final    FEW WBC PRESENT,BOTH PMN AND MONONUCLEAR NO ORGANISMS SEEN    Culture   Final    No growth aerobically or anaerobically. Performed at [facility]    Report Status 05/20/2016 FINAL  Final  Aerobic Culture (superficial specimen)     Status: None (Preliminary result)   Collection Time: 05/20/16  8:28 AM  Result Value Ref Range Status   Specimen Description WOUND RIGHT LOWER LUNG  Final   Special Requests SWAB SPEC A RIGHT LOWER LOBE POF ZINACEF  Final   Gram Stain   Final    RARE WBC PRESENT, PREDOMINANTLY PMN NO ORGANISMS SEEN    Culture PENDING  Incomplete   Report Status PENDING  Incomplete  Body fluid culture     Status: None (Preliminary result)   Collection Time: 05/20/16 10:34 AM  Result Value Ref Range Status   Specimen Description FLUID RIGHT PLEURAL  Final   Special Requests SPEC F IN TASP CONTAINER POF ZINACEF  Final   Gram Stain   Final    FEW WBC PRESENT,BOTH PMN AND MONONUCLEAR NO ORGANISMS SEEN    Culture PENDING  Incomplete   Report Status PENDING  Incomplete  Culture, fungus without smear     Status: None (Preliminary result)   Collection Time: 05/20/16 10:37 AM  Result Value Ref Range Status   Specimen Description TISSUE RIGHT PLEURAL  Final   Special Requests POF ZINACEF  Final   Culture NO FUNGUS ISOLATED AFTER 1 DAY  Final   Report Status PENDING  Incomplete  Aerobic/Anaerobic Culture (surgical/deep wound)     Status: None (Preliminary result)   Collection Time: 05/20/16 10:37 AM  Result Value Ref Range Status   Specimen Description TISSUE RIGHT PLEURAL  Final   Special Requests POF ZINACEF  Final   Gram Stain   Final    MODERATE WBC PRESENT,BOTH PMN AND  MONONUCLEAR NO ORGANISMS SEEN    Culture PENDING  Incomplete   Report Status PENDING  Incomplete      Radiology Studies: Dg Chest 2 View Result Date: 05/17/2016 Cardiomegaly and bilateral pulmonary edema with increased right pleural effusion consistent with congestive heart failure.   Ct Chest Wo Contrast Result Date: 05/17/2016 1. Large complex right pleural effusion now appears loculated. There is a collection of gas within the posterior basilar component of the loculated right pleural effusion which is worrisome for empyema. 2. Persistent enlarged mediastinal lymph nodes and a left internal mammary lymph node. Differential considerations include metastatic adenopathy, lymphoproliferative disorder, granulomatous inflammation/infection or (less likely) reactive adenopathy.   Dg Chest Port 1 View Result Date: 05/17/2016 Again noted mild congestion/interstitial edema bilaterally. Slight improvement in aeration right lower lung. Persistent small right pleural effusion with right basilar atelectasis or infiltrate. There is no pneumothorax. Electronically Signed   By: Lahoma Crocker M.D.   On: 05/17/2016 14:50    Scheduled Meds: . amiodarone  400 mg Oral BID  . aspirin  81 mg Oral Daily  . azithromycin  500 mg Oral Daily  . cefTRIAXone   1 g Intravenous Q24H  .  cefUROXime   1.5 g Intravenous To SSTC  . docusate sodium  200 mg Oral BID  . feeding supplement   237 mL Oral TID BM  . fluticasone  1 spray Each Nare Daily  .  guaiFENesin  600 mg Oral BID  . ipratropium  0.5 mg Nebulization TID  . levalbuterol  1.25 mg Nebulization TID  . nicotine  21 mg Transdermal Daily  . potassium chloride  20 mEq Oral BID  . tamsulosin  0.4 mg Oral QPC breakfast   Continuous Infusions: . dextrose 5 % and 0.9% NaCl 100 mL/hr at 05/21/16 1000  . DOPamine 3 mcg/kg/min (05/21/16 1000)     LOS: 7 days    Time spent: 15 minutes  Greater than 50% of the time spent on counseling and coordinating the  care.   Leisa Lenz, MD [facility] Pager [phone]  If 7PM-7AM, please contact night-coverage [email or web address] Password [email or web address] 05/21/2016, 10:52 AM

## 2016-05-21 NOTE — Care Management Note (Addendum)
Case Management Note  Patient Details  Name: GARRIN KIRWAN MRN: [id number] Date of Birth: 02/15/1946  Subjective/Objective:  S/p Right VATS for effusion/ empyema                   Action/Plan:  PTA independent from home with wife.  CM will continue to follow for discharge needs   Expected Discharge Date:                  Expected Discharge Plan:  [place]  In-House Referral:  NA  Discharge planning Services  CM Consult  Post Acute Care Choice:    Choice offered to:  NA  DME Arranged:    DME Agency:     HH Arranged:    HH Agency:     Status of Service:  In process, will continue to follow  If discussed at Long Length of Stay Meetings, dates discussed:    Additional Comments: 05/21/2016 Pt BP dropped overnight - required fluids and pressors.  Pt will need home oxygen at discharge - Pt remains on pressors with high output Chest tubes.  CM will continue to follow for discharge needs and arrange HH/DME prior to discharge. Maryclare Labrador, RN 05/21/2016, 10:13 AM

## 2016-05-21 NOTE — Progress Notes (Signed)
Nutrition Follow-up  DOCUMENTATION CODES:   Severe malnutrition in context of chronic illness  INTERVENTION:  Continue Ensure Enlive po TID, each supplement provides 350 kcal and 20 grams of protein.  Encourage adequate PO intake.   NUTRITION DIAGNOSIS:   Malnutrition related to chronic illness as evidenced by severe depletion of body fat, severe depletion of muscle mass; ongoing  GOAL:   Patient will meet greater than or equal to 90% of their needs; not met  MONITOR:   PO intake, Supplement acceptance, Labs, Weight trends, I & O's  REASON FOR ASSESSMENT:   Malnutrition Screening Tool    ASSESSMENT:    71 yo male patient with past medical history of COPD, Smoking, prostate cancer, Pneumonia 1 year ago and inguinal hernia S/P repair who presented with a 2 week history of progressive shortness of breath on 05/13/2016.   Procedure (1/8): VIDEO ASSISTED THORACOSCOPY (VATS) with drainage of pleural effusion (Right) VIDEO BRONCHOSCOPY WITH ENDOBRONCHIAL ULTRASOUND (Right)  No recent meal completion recorded, however pt reports po 50% this AM at breakfast. Pt reports no appetite and overall not feeling well. Pt does reports experiencing constipation. Noted dulcolax has been ordered. Pt currently has Ensure ordered and has been consuming them. RD to continue with current orders. Pt encouraged to eat his foods at meals and to drink his supplements.   Labs and medications reviewed.   Diet Order:  Diet Heart Room service appropriate? Yes; Fluid consistency: Thin  Skin:   (Incision on back and chest)  Last BM:  1/5  Height:   Ht Readings from Last 1 Encounters:  05/21/16 5' 11" (1.803 m)    Weight:   Wt Readings from Last 1 Encounters:  05/21/16 180 lb 1.9 oz (81.7 kg)    Ideal Body Weight:  78.2 kg  BMI:  Body mass index is 25.12 kg/m.  Estimated Nutritional Needs:   Kcal:  2150-2350  Protein:  105-120 grams  Fluid:  Per MD  EDUCATION NEEDS:   No education  needs identified at this time  Corrin Parker, MS, RD, LDN Pager # [phone] After hours/ weekend pager # [phone]

## 2016-05-21 NOTE — Progress Notes (Addendum)
[address]       [place] [address]             [phone]       1 Day Post-Op Procedure(s) (LRB): VIDEO ASSISTED THORACOSCOPY (VATS) with drainage of pleural effusion (Right) VIDEO BRONCHOSCOPY WITH ENDOBRONCHIAL ULTRASOUND (Right)  Subjective: Patient walked around unit earlier. He does have shortness of breath and constipation.  Objective: Vital signs in last 24 hours: Temp:  [97.6 F (36.4 C)-98.6 F (37 C)] 97.8 F (36.6 C) (01/09 1243) Pulse Rate:  [55-102] 79 (01/09 1100) Cardiac Rhythm: Atrial fibrillation (01/09 0800) Resp:  [9-26] 16 (01/09 1200) BP: (71-116)/(45-81) 105/70 (01/09 1100) SpO2:  [83 %-100 %] 95 % (01/09 1300) Arterial Line BP: (58-140)/(35-106) 122/46 (01/09 1000) Weight:  [180 lb 1.9 oz (81.7 kg)] 180 lb 1.9 oz (81.7 kg) (01/09 0600)   Hemodynamic parameters for last 24 hours: CVP:  [1 mmHg-8 mmHg] 1 mmHg  Intake/Output from previous day: 01/08 0701 - 01/09 0700 In: 6227.8 [P.O.:60; I.V.:3217.8; IV Piggyback:2650] Out: 78 [Urine:900; Blood:200; Chest Tube:510]   Physical Exam:  Cardiovascular: [facility] Pulmonary: Coarse breath sounds Abdomen: Soft, non tender, bowel sounds present. Extremities: SCDs in place Wounds: Dressing is clean and dry.   Chest Tube: to suction, no air leak  Lab Results: CBC: Recent Labs  05/20/16 2115 05/21/16 0410  WBC 20.1* 15.6*  HGB 11.1* 10.6*  HCT 32.9* 32.1*  PLT 329 342   BMET:  Recent Labs  05/20/16 0320 05/21/16 0410  NA 132* 134*  K 5.5* 4.9  CL 98* 100*  CO2 25 27  GLUCOSE 96 96  BUN 26* 28*  CREATININE 0.79 1.23  CALCIUM 8.2* 8.3*    PT/INR:  Recent Labs  05/19/16 1038  LABPROT 14.8  INR 1.15   ABG:  INR: Will add last result for INR, ABG once components are confirmed Will add last 4 CBG results once components are confirmed  Assessment/Plan:  1. CV - A fib with CVR. MAP 70-low 80's. On Amiodarone 400 mg bid and Dopamine drip at 3  mcg/kg/min. Patient required bolus of IVF and multiple albumin overnight. Per Dr. Prescott Gum, start Neo Synephrine. As discussed with Dr. Prescott Gum, concerned about Amiodarone (pulmonary toxicity) use as his CXR appears worse. 2.  Pulmonary - Chest tubes with 510 cc of output. CXR this am showed some worsening of pulmonary interstitium, no pneumothorax.Chest tubes to remain to suction for now. Encourage incentive spirometer and flutter valve. 3. GI-continue Ensure Enlive po tid as patient with malnutrition related to chronic illness. 3. Anemia-H and H 10.6 and 32.1 4. ID-On Vancomycin and Zosyn for PNA 5. Will stop accu checks as tolerating a diet and no history of diabetes. 6. Lactulose for constipation 7. A line and foley to remain for now 8. Lovenox for DVT prophylaxis  ZIMMERMAN,DONIELLE MPA-C 05/21/2016,2:52 PM   cxr with interstitial edema and borderline sats- start lasix and add neo as needed to keep BP > 100 sap Path and cultures still pending Keep a-line while BP an issue patient examined and medical record reviewed,agree with above note. Tharon Aquas Trigt III 05/21/2016

## 2016-05-21 NOTE — Op Note (Signed)
NAME:  WINTON, OFFORD NO.:  [id number]  MEDICAL RECORD NO.:  [id number]  LOCATION:  [facility]                         FACILITY:  [facility]  PHYSICIAN:  [name], M.D.  DATE OF BIRTH:  10-May-1946  DATE OF PROCEDURE:  05/20/2016 DATE OF DISCHARGE:                              OPERATIVE REPORT   OPERATION: 1. Video bronchoscopy. 2. Transbronchial endoscopic ultrasound directed biopsy of mediastinal     lymph node region 7-subcarinal. 3. Right VATS (video-assisted thoracoscopic surgery) with mini     thoracotomy, drainage of loculated empyema and decortication of     right lower lobe.  PREOPERATIVE DIAGNOSIS:  Right lower lobe pneumonia with empyema, subcarinal adenopathy.  SURGEON:  [name], M.D.  ASSISTANT:  Lars Pinks, PA-C.  ANESTHESIA:  General.  INDICATIONS:  The patient is a 71 year old Caucasian male smoker who was admitted to the hospital through the emergency room with severe shortness of breath as well as weight loss, malaise, weakness, and cough.  Radiographic studies showed a large right loculated pleural effusion.  CT scan confirmed this.  There was no evidence of a discrete mass, but there was adenopathy in the subcarinal region on CT scan.  He was treated with broad-spectrum antibiotics.  He had a thoracentesis which initially removed 1 L without significant change in x-ray.  A second thoracentesis was attempted but only drained 3 ounces of fluid with evidence of loculated pockets of fluid on ultrasound.  Thoracic Surgical evaluation was requested for drainage of the probable right empyema-loculated pleural effusion as well as biopsy of the mediastinal lymph node.  I examined the patient in his hospital room and discussed with him the findings on CT scan and chest x-ray as well as his echocardiogram all of which I personally reviewed.  I discussed the plan for bronchoscopy, bronchoscopic biopsy and right VATS for drainage of  pleural effusion in order to optimize treatment of infection, improve his symptoms, and to diagnose an occult underlying malignancy.  I discussed the major aspects of the procedure including the use of general anesthesia, the location of surgical incisions, the postoperative care involving chest tube drains, antibiotics, pain medicine, and ICU care.  I discussed the potential risks including risks of ventilator dependence, recurrent pneumonia, bleeding, worsening infection, and death.  He demonstrated his understanding and agreed to proceed with surgery under what I felt was an informed consent.  OPERATIVE FINDINGS: 1. Bronchoscopy without any significant findings of endobronchial     abnormalities. 2. EBUS transbronchial biopsy of subcarinal lymph node region with     specimens pending for cytology. 3. Large loculated effusion, probable empyema, 2.5 L of fluid in the     right chest with a peel over the right lower lobe which was     removed.  OPERATIVE PROCEDURE:  The patient was brought to the operating room, placed on the operating table where general anesthesia was induced.  A proper time-out was performed.  Through the endotracheal tube, a video bronchoscope was passed.  The distal trachea and carina were normal. The left mainstem bronchus and segmental orifices of the upper and lower lobes of the left lung were examined and were unremarkable.  The bronchoscope was passed on the right mainstem bronchus, which was unremarkable.  There were no endobronchial abnormalities of the upper lobe and middle lobe.  The lower lobe segments had some blood tinged mucosa which was irrigated and washings obtained, as well as brushings for cytology.  The bronchoscope was removed.  The EBUS scope was then passed to the carina and the station 7 subcarinal lymph node station was then biopsied several times using the transbronchial ultrasound-guided needle for aspiration of that region. The  bronchoscope was then removed.  The patient was then turned right side up.  An attempt at double-lumen intubation and ventilation was unsuccessful to use airway anatomy.  A single-lumen tube was used with a bronchial blocker, which was not effective for collapsing the right lung.  The patient's right chest was prepped and draped as a sterile field.  A proper time-out was performed.  A small incision was made in the fifth interspace anterior to the tip of the scapula.  The pleural space was entered.  The pleural space was obliterated and the camera was unable to demonstrate any anatomy due to the adhesions.  The incision was extended.  A muscle-sparing incision was made.  The ribs were gently spread.  A large amount of loculated fluid was then drained from the right chest-2.5 L.  There were large amounts of glue-like material adhesions as well as a peel on the right lower lobe.  After removal of all the pleural material and fluid, there was good mobilization of the lower lobe, middle lobe and upper lobe.  There were no obvious lung masses or pleural nodules suggestive of malignancy.  The patient did have significant smoking changes in his lungs which were obvious.  The chest was irrigated with warm saline and chest tubes were placed to drain the superior and inferior aspect of the right chest.  The material removed was sent for pathology, cytology, as well as cultures.  Pericostal suture of #2 Vicryl was used to reapproximate the ribs after the right lung was fully expanded.  The muscle layers closed with interrupted #1 Vicryl.  The subcutaneous and skin layers were closed with a running Vicryl.  The chest tubes were connected to an underwater Pleur-evac drainage system and sterile dressings were applied.  The patient was then turned supine, extubated and returned to recovery room in stable condition.     [name], M.D.     PV/MEDQ  D:  05/20/2016  T:  05/21/2016   Job:  [id number]

## 2016-05-22 ENCOUNTER — Inpatient Hospital Stay (HOSPITAL_COMMUNITY): Payer: Medicare Other

## 2016-05-22 DIAGNOSIS — R600 Localized edema: Secondary | ICD-10-CM

## 2016-05-22 LAB — CBC
HCT: 33.9 % — ABNORMAL LOW (ref 39.0–52.0)
HEMOGLOBIN: 11.3 g/dL — AB (ref 13.0–17.0)
MCH: 30.3 pg (ref 26.0–34.0)
MCHC: 33.3 g/dL (ref 30.0–36.0)
MCV: 90.9 fL (ref 78.0–100.0)
Platelets: 331 10*3/uL (ref 150–400)
RBC: 3.73 MIL/uL — AB (ref 4.22–5.81)
RDW: 14.9 % (ref 11.5–15.5)
WBC: 16 10*3/uL — ABNORMAL HIGH (ref 4.0–10.5)

## 2016-05-22 LAB — POCT I-STAT 3, ART BLOOD GAS (G3+)
Acid-Base Excess: 5 mmol/L — ABNORMAL HIGH (ref 0.0–2.0)
BICARBONATE: 29.7 mmol/L — AB (ref 20.0–28.0)
O2 SAT: 93 %
PCO2 ART: 40.9 mmHg (ref 32.0–48.0)
PO2 ART: 62 mmHg — AB (ref 83.0–108.0)
Patient temperature: 97.8
TCO2: 31 mmol/L (ref 0–100)
pH, Arterial: 7.467 — ABNORMAL HIGH (ref 7.350–7.450)

## 2016-05-22 LAB — COMPREHENSIVE METABOLIC PANEL
ALT: 23 U/L (ref 17–63)
AST: 31 U/L (ref 15–41)
Albumin: 2.1 g/dL — ABNORMAL LOW (ref 3.5–5.0)
Alkaline Phosphatase: 198 U/L — ABNORMAL HIGH (ref 38–126)
Anion gap: 6 (ref 5–15)
BUN: 28 mg/dL — ABNORMAL HIGH (ref 6–20)
CO2: 26 mmol/L (ref 22–32)
Calcium: 8.2 mg/dL — ABNORMAL LOW (ref 8.9–10.3)
Chloride: 101 mmol/L (ref 101–111)
Creatinine, Ser: 1.17 mg/dL (ref 0.61–1.24)
GFR calc Af Amer: >60 mL/min (ref >60)
GFR calc non Af Amer: >60 mL/min (ref >60)
Glucose, Bld: 104 mg/dL — ABNORMAL HIGH (ref 65–99)
Potassium: 4.4 mmol/L (ref 3.5–5.1)
Sodium: 133 mmol/L — ABNORMAL LOW (ref 135–145)
Total Bilirubin: 0.7 mg/dL (ref 0.3–1.2)
Total Protein: 5.4 g/dL — ABNORMAL LOW (ref 6.5–8.1)

## 2016-05-22 LAB — AEROBIC CULTURE W GRAM STAIN (SUPERFICIAL SPECIMEN): Culture: NO GROWTH

## 2016-05-22 MED ORDER — ENOXAPARIN SODIUM 40 MG/0.4ML ~~LOC~~ SOLN
40.0000 mg | SUBCUTANEOUS | Status: DC
  Administered 2016-05-22 – 2016-05-26 (×5): 40 mg via SUBCUTANEOUS
  Filled 2016-05-22 (×5): qty 0.4

## 2016-05-22 MED ORDER — ALBUMIN HUMAN 25 % IV SOLN
12.5000 g | Freq: Four times a day (QID) | INTRAVENOUS | Status: AC
  Administered 2016-05-22 (×3): 12.5 g via INTRAVENOUS
  Filled 2016-05-22 (×3): qty 50

## 2016-05-22 MED ORDER — LACTULOSE 10 GM/15ML PO SOLN
30.0000 g | Freq: Once | ORAL | Status: AC
  Administered 2016-05-22: 30 g via ORAL
  Filled 2016-05-22: qty 45

## 2016-05-22 MED ORDER — ELDERTONIC PO ELIX
15.0000 mL | ORAL_SOLUTION | Freq: Three times a day (TID) | ORAL | Status: DC
  Administered 2016-05-22 – 2016-05-31 (×16): 15 mL via ORAL
  Filled 2016-05-22 (×30): qty 15

## 2016-05-22 MED ORDER — HYDROCORTISONE NA SUCCINATE PF 100 MG IJ SOLR
50.0000 mg | Freq: Four times a day (QID) | INTRAMUSCULAR | Status: DC
  Administered 2016-05-22 – 2016-05-26 (×16): 50 mg via INTRAVENOUS
  Filled 2016-05-22 (×17): qty 2

## 2016-05-22 NOTE — Progress Notes (Signed)
[facility] BRIEF SICU PROGRESS NOTE  2 Days Post-Op  S/P Procedure(s) (LRB): VIDEO ASSISTED THORACOSCOPY (VATS) with drainage of pleural effusion (Right) VIDEO BRONCHOSCOPY WITH ENDOBRONCHIAL ULTRASOUND (Right)   Stable day  Plan: Continue current plan  Rexene Alberts, MD 05/22/2016 7:18 PM

## 2016-05-22 NOTE — Progress Notes (Addendum)
Name: [name] MRN: [id number] DOB: 04/27/1946    ADMISSION DATE:  05/13/2016 CONSULTATION DATE:  05/21/16  REFERRING MD :  Darcey Nora  CHIEF COMPLAINT:  Hypotension  Brief:    71 year old male with PMH of chronic bronchitis, COPD, and prostate cancer (early 2000s).  Admitted to [facility] 05/14/16 with SOB. CTA revealed subcarinal mass and large right sided pleural effusion.  Significant Events  1/1 > admit. 1/2 > cards consult for A.fib > started on heparin 01/03. 1/2 > Thoracentesis 1516m removed > exudative / parapneumonic. 1/5 >  right thora with 1224m> thick septations noted on [place] referred for VATS. 1/8 > [facility], he was taken to the OR for VATS and bx of subcarinal mass > hypotension > not fluid responsive  > dopamine  01/09 > PCCM re-consulted.  SUBJECTIVE:  Feels better, remains on lose dose dopamine, receiving albumin, patient states chest tube to be removed today. Was placed on low dose neo overnight, off now.   VITAL SIGNS: Temp:  [97.8 F (36.6 C)-98.6 F (37 C)] 98.4 F (36.9 C) (01/10 0700) Pulse Rate:  [68-92] 76 (01/10 0800) Resp:  [12-22] 19 (01/10 0840) BP: (77-120)/(53-88) 109/74 (01/10 0800) SpO2:  [89 %-100 %] 97 % (01/10 0840) Arterial Line BP: (100-166)/(46-74) 141/52 (01/10 0800) Weight:  [82.1 kg (181 lb)] 82.1 kg (181 lb) (01/10 0600)  PHYSICAL EXAMINATION: General: Elderly appearing male, sitting in chair, no distress  Neuro: alert and oriented, follows commands  HEENT: [place]/AT. PERRL,  Cardiovascular: IRIR, no M/R/G Lungs: Respirations even and unlabored.  Coarse bilaterally R > L.  Right sided chest tube with + air leak, bloody drainage. Abdomen: BS x 4, soft, NT/ND.  Musculoskeletal: No gross deformities, no edema.  Skin: Intact, warm, no rashes.     Recent Labs Lab 05/20/16 0320 05/21/16 0410 05/22/16 0345  NA 132* 134* 133*  K 5.5* 4.9 4.4  CL 98* 100* 101  CO2 _0 BUN 26* 28* 28*  CREATININE  0.79 1.23 1.17  GLUCOSE 96 96 104*    Recent Labs Lab 05/20/16 2115 05/21/16 0410 05/22/16 0345  HGB 11.1* 10.6* 11.3*  HCT 32.9* 32.1* 33.9*  WBC 20.1* 15.6* 16.0*  PLT 329 342 331   Dg Chest Port 1 View  Result Date: 05/22/2016 CLINICAL DATA:  Chest 2, followup EXAM: PORTABLE CHEST 1 VIEW COMPARISON:  Portable chest x-ray of 05/21/2016 FINDINGS: There is little change to perhaps minimal improvement in diffuse airspace disease. Also there appear to be bilateral pleural effusions present. Right chest tube noted and no definite pneumothorax is seen. Mild cardiomegaly is stable. Right IJ central venous line tip overlies the mid lower SVC. IMPRESSION: 1. Little change to minimal improvement in diffuse airspace disease. 2. Two right chest tubes remain.  No pneumothorax. 3. Probable bilateral effusions. Electronically Signed   By: PaIvar Drape.D.   On: 05/22/2016 08:34   Dg Chest Port 1 View  Result Date: 05/21/2016 CLINICAL DATA:  Status post VATS yesterday for empyema drainage EXAM: PORTABLE CHEST 1 VIEW COMPARISON:  Portable chest x-ray of May 20, 2016 FINDINGS: The lungs remain well-expanded. The interstitial markings are diffusely increased and more conspicuous today. The left hemidiaphragm remains obscured. The right hemidiaphragm extends below the inferior margin of the image. There is no pneumothorax. Two right-sided chest tubes remain and are in stable position. The cardiac silhouette is mildly enlarged. The pulmonary vascularity is engorged and indistinct. There is  calcification in the wall of the aortic arch. The right internal jugular venous catheter tip projects over the midportion of the SVC. IMPRESSION: Mild interval worsening in the appearance of the pulmonary interstitium bilaterally. This may reflect worsening pulmonary edema or interstitial pneumonia. No pneumothorax nor significant pleural effusion. The support tubes are in stable position. Thoracic aortic atherosclerosis.  Electronically Signed   By: David  Martinique M.D.   On: 05/21/2016 07:06   Dg Chest Port 1 View  Result Date: 05/20/2016 CLINICAL DATA:  Status post VATS and drainage of loculated right pleural effusion/empyema. EXAM: PORTABLE CHEST 1 VIEW COMPARISON:  CT of the chest on 05/17/2016 FINDINGS: Angled right-sided basilar chest tube and additional straight chest tube are present. There is improved aeration of the right lung after drainage of right pleural fluid. Underlying chronic lung disease and interstitial prominence remains bilaterally. No pneumothorax identified. Stable cardiac enlargement. Right jugular central line present with the catheter tip in the SVC. IMPRESSION: Improved aeration of the right lung following VATS and drainage of right pleural fluid. Angled and straight right-sided chest tubes are present. No visualized pneumothorax. Central line also present with the catheter tip in the SVC. Electronically Signed   By: Aletta Edouard M.D.   On: 05/20/2016 14:05    STUDIES:  CTA chest 01/02 > no PE.  Large right sided effusion, small left. 6.9cm subcarinal mass with mod mediastinal adenopathy.   ASSESSMENT / PLAN:  Acute hypoxic respiratory failure - multifactorial in setting of large right parapneumonic effusion (s/p VATS 01/08), COPD, atelectasis. Large right parapneumonic pleural effusion - s/p thora 01/02 and 01/05, then referred to CVTS and is now s/p VATS 01/08. Subcarinal mass - s/p biopsy 01/08. COPD per report - no PFT's in system. PAH - PAP 43 per echo 01/02. Tobacco dependence. Plan: Continue supplemental O2 as needed to maintain SpO2 > 92%. Continue empiric abx and follow cultures. Post op / chest tube management per CVTS. Follow pathology results. Continue BD's. Mobilize as able. Incentive spirometry / flutter valve. Continue nicotine patch. Tobacco cessation counseling.  Shock - likely combo hypovolemic and septic.  Of note, he reports BP runs borderline low normally  but unable to give range.  Notes since admission all show SBP 90's to ~110. Plan: Albumin 12.5 g q6h x 3 doses  CVPS once per shift (last 5)  Wean Dopamine to off as able  Would accept SBP > 90 if mental status remains normal  New A.fib. 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 Plan: Continue lovenox - defer to CVTS on dosing and if can increase to treatment dose (vs prophylactic). Transition to NOAC when OK with CVTS.   Hayden Pedro, AG-ACNP [facility]  Pgr: [phone]  PCCM Pgr: [phone]   STAFF NOTE: Linwood Dibbles, MD FACP have personally reviewed patient's available data, including medical history, events of note, physical examination and test results as part of my evaluation. I have discussed with resident/NP and other care providers such as pharmacist, RN and RRT. In addition, I personally evaluated patient and elicited key findings of:  Awake, in chair, dopamine 3 mics noted, neo is now off, a line was also dc'ed, cvp 5, unclear if this is accurate although 1/2 ef high - hypovolemic findings, lactic re assuring he is perfusing at sys 90, MAP 60, sys now 122 will d/w rn to reduce dop to off, cortisol is 16 insetting shock, add hydrocortisone 50 q6h, this may be a part of his  shock stat, pcxr concern for int edema, as no distress, consider holding lasix, if he does not come off dop, may need to add vaso presson for 24 hours The patient is critically ill with multiple organ systems failure and requires high complexity decision making for assessment and support, frequent evaluation and titration of therapies, application of advanced monitoring technologies and extensive interpretation of multiple databases.   Critical Care Time devoted to patient care services described in this note is 30 Minutes. This time reflects time of care of this signee: Merrie Roof, MD FACP. This critical care time does not reflect procedure time, or teaching time  or supervisory time of PA/NP/Med student/Med Resident etc but could involve care discussion time. Rest per NP/medical resident whose note is outlined above and that I agree with   Lavon Paganini. Titus Mould, MD, [facility] Pgr: [facility] 05/22/2016 10:28 AM

## 2016-05-22 NOTE — Progress Notes (Addendum)
Patient ID: [name], male   DOB: [birth year], 71 y.o.   MRN: [id number]  PROGRESS NOTE    [name] MACON  WYO:[id number] DOB: 30-Jul-1945 DOA: 05/13/2016  PCP: [name]   Brief Narrative:  71 year old male with past medical history of prostate cancer, tobacco use and COPD. Pt was admitted to [facility] 05/13/2016 for shortness of breath for past 1 week prior to this admission. He was found to have respiratory failure with hypoxia thought to be due to pneumonia and possible CHF. He had been previously treated for bronchopneumonia with oral Levaquin at a urgent care facility. Pt was also in atrial fibrillation and had a large right pleural effusion. He was placed on IV antibiotics. Chest CT scan demonstrated a large complex right pleural effusion with a subcarinal mass. Thoracentesis returned 1.2 L without significant improvement in chest x-ray. Cytology was negative. Cultures have been negative but the patient has been on IV Rocephin and oral azithromycin. A repeat attempt at thoracentesis removed minimal fluid due to the loculated process. Thoracic surgical consulted for VATS to drain the complex effusion/empyema.  The patient was seen by cardiology for his atrial fibrillation and echocardiogram was performed. It showed good LV function, no pericardial effusion, no significant valvular disease and mildly elevated right-sided pressures with mild TR. He was placed on IV heparin for a few days in preparation for possible bronchoscopy with biopsy and then the plan is for  NOAC for management of his atrial fibrillation.[name] He is now off the IV heparin and CTS placed him on prophylactic dose Lovenox in preparation for VATS Monday, January 8 at [facility] hospital. Peripheral edema has improved with Lasix therapy.  Patient transferred to [facility] 05/18/2016  to have right VATS and bronchoscopy with biopsy on 1/8.  Pt underwent VATS and bx of subcarinal mass 05/20/2016. He tolerated the procedure will but later  on developed hypotension which did not improve with fluid challenge and albumin. He was started on dopamine subsequently.   Assessment & Plan:   Acute respiratory failure with hypoxia secondary to right parapneumonic effusion / empyema / leukocytosis - S/P thoracentesis 05/14/2016 yielded 1.5 L fluid, cultures so far negative, cytology non diagnostic - Repeat thoracentesis not successful due to loculated effusion - S/p VATS and endobronchial biopsy of subcarinal mass and drainage of loculated right pleural effusion 05/20/2016  - Right side chest tube in place  - Continue supplemental oxygen - Continue bronchdialtors  - Stopped azithromycin and Rocephin and started vanco and zosyn 1/9  Shock, septic and hypovolemic - Improving with dopamine - CCM following for help with pressor support   Subcarinal mass - S/P biopsy as noted above - Follow up results of biopsy   Acute diastolic CHF - LE edema has improved with lasix - 2 D ECHO showed good LV function, no pericardial effusion, no significant valvular disease and mildly elevated right-sided pressures with mild TR. - Continue lasix 20 mg IV daily  - Continue daily weight and strict intake and output  Hyponatremia - Dehydration or CHF etiology - Sodium 133, stable overall  New onset Atrial fibrillation - CHADS vasc score 4 - Rate controlled with amiodarone - On Lovenox subQ for prophylaxis   Hypokalemia / Hypomagnesemia / Hyperkalemia  - Supplemented and now potassium 5.5 - Given dose of kayexalate after surgery 1/8 - Potassium WNL  COPD (chronic obstructive pulmonary disease) (HCC) - Continue duoneb every 6 hours scheduled and xopenex every 3 hours PRN shortness of breath or wheezing  Tobacco abuse - Counseled on cessation - Nicotine patch ordered    Severe protein calorie malnutrition - In the context of chronic illness - Continue nutritional supplementation    BPH - Continue Flomax   DVT prophylaxis: SCD's  bilaterally  Code Status: full code  Family Communication: family not at the bedside this am  Disposition Plan:remains in SDU as he requires pressor support    Consultants:  Pulmonary /critical care - re consulted 1/9 for hypotension   Cardiology  Thoracic surgery  Procedures:  Echocardiogram done 1/2: Enlarged atria, preserved ejection fraction  Thoracentesis done 1/2 : 1.5 L fluid consistent with transudate. No malignant cells by fluids cytology.  VATS and endobronchial biopsy of subcarinal mass and drainage of loculated right pleural effusion 05/20/2016   Antimicrobials:  IV Rocephin and Zithromax 1/1 --> 05/21/2016  Vanco and zosyn 05/21/2016 -->  Prior to admission, patient on Levaquin 12/30-12/31    Subjective: No overnight events.   Objective: Vitals:   05/22/16 0700 05/22/16 0800 05/22/16 0810 05/22/16 0840  BP: 115/88 109/74    Pulse: 89 76    Resp: _0 Temp: 98.4 F (36.9 C)     TempSrc: Oral     SpO2: (!) 89% 95% 95% 97%  Weight:      Height:        Intake/Output Summary (Last 24 hours) at 05/22/16 1033 Last data filed at 05/22/16 0800  Gross per 24 hour  Intake           1810.3 ml  Output             1985 ml  Net           -174.7 ml   Filed Weights   05/20/16 0441 05/21/16 0600 05/22/16 0600  Weight: 77.3 kg (170 lb 6.4 oz) 81.7 kg (180 lb 1.9 oz) 82.1 kg (181 lb)    Examination:  General exam: no distress  Respiratory system: right side chest tube, diminished breath sounds  Cardiovascular system: S1 & S2 heard, rate controlled  Gastrointestinal system: (+) BS, non tender abdomen, non distended  Central nervous system: No focal deficits  Extremities: No edema, palpable pulses bilaterally  Skin: no lesions or ulcers   Psychiatry: Normal mood and behavior   Data Reviewed: I have personally reviewed following labs and imaging studies  CBC:  Recent Labs Lab 05/19/16 0445 05/20/16 0320 05/20/16 2115 05/21/16 0410  05/22/16 0345  WBC 12.9* 13.9* 20.1* 15.6* 16.0*  NEUTROABS 9.1*  --   --   --   --   HGB 11.9* 11.7* 11.1* 10.6* 11.3*  HCT 35.5* 35.4* 32.9* 32.1* 33.9*  MCV 89.6 90.5 91.1 90.9 90.9  PLT 350 372 329 342 784   Basic Metabolic Panel:  Recent Labs Lab 05/17/16 0136 05/18/16 0639 05/19/16 0445 05/19/16 1038 05/20/16 0320 05/21/16 0410 05/22/16 0345  NA 137 135 134* 132* 132* 134* 133*  K 4.6 4.4 4.9 5.0 5.5* 4.9 4.4  CL 101 96* 98* 97* 98* 100* 101  CO2 _1 GLUCOSE 118* 85 104* 109* 96 96 104*  BUN 37* 33* 27* 26* 26* 28* 28*  CREATININE 0.90 0.81 0.79 0.84 0.79 1.23 1.17  CALCIUM 8.2* 8.4* 8.4* 8.5* 8.2* 8.3* 8.2*  MG 1.7 1.6* 1.7 1.8 1.9  --   --    GFR: Estimated Creatinine Clearance: 60.8 mL/min (by C-G formula based on SCr of 1.17 mg/dL). Liver Function Tests:  Recent  Labs Lab 05/17/16 0136 05/19/16 1038 05/22/16 0345  AST 36 28 31  ALT 34 28 23  ALKPHOS 251* 254* 198*  BILITOT 0.3 0.5 0.7  PROT 4.9* 5.2* 5.4*  ALBUMIN 1.9* 1.7* 2.1*   No results for input(s): LIPASE, AMYLASE in the last 168 hours. No results for input(s): AMMONIA in the last 168 hours. Coagulation Profile:  Recent Labs Lab 05/19/16 1038  INR 1.15   Cardiac Enzymes:  Recent Labs Lab 05/19/16 0445  TROPONINI 0.05*   BNP (last 3 results) No results for input(s): PROBNP in the last 8760 hours. HbA1C: No results for input(s): HGBA1C in the last 72 hours. CBG:  Recent Labs Lab 05/17/16 2151 05/20/16 1749 05/20/16 2313 05/21/16 0504 05/21/16 1241  GLUCAP 124* 123* 123* 92 112*   Lipid Profile: No results for input(s): CHOL, HDL, LDLCALC, TRIG, CHOLHDL, LDLDIRECT in the last 72 hours. Thyroid Function Tests: No results for input(s): TSH, T4TOTAL, FREET4, T3FREE, THYROIDAB in the last 72 hours. Anemia Panel: No results for input(s): VITAMINB12, FOLATE, FERRITIN, TIBC, IRON, RETICCTPCT in the last 72 hours. Urine analysis:    Component Value Date/Time    COLORURINE YELLOW 05/14/2016 0032   APPEARANCEUR CLEAR 05/14/2016 0032   LABSPEC 1.011 05/14/2016 0032   PHURINE 5.0 05/14/2016 0032   GLUCOSEU NEGATIVE 05/14/2016 0032   HGBUR NEGATIVE 05/14/2016 0032   BILIRUBINUR NEGATIVE 05/14/2016 0032   KETONESUR NEGATIVE 05/14/2016 0032   PROTEINUR 30 (A) 05/14/2016 0032   NITRITE NEGATIVE 05/14/2016 0032   LEUKOCYTESUR NEGATIVE 05/14/2016 0032   Sepsis Labs: _0 (procalcitonin:4,lacticidven:4)   Recent Results (from the past 240 hour(s))  Blood culture (routine x 2)     Status: None   Collection Time: 05/14/16 12:02 AM  Result Value Ref Range Status   Specimen Description BLOOD RIGHT ANTECUBITAL  Final   Special Requests BOTTLES DRAWN AEROBIC AND ANAEROBIC 5ML  Final   Culture   Final    NO GROWTH 5 DAYS Performed at [facility]    Report Status 05/19/2016 FINAL  Final  Blood culture (routine x 2)     Status: None   Collection Time: 05/14/16 12:02 AM  Result Value Ref Range Status   Specimen Description BLOOD LEFT ANTECUBITAL  Final   Special Requests BOTTLES DRAWN AEROBIC AND ANAEROBIC 5ML  Final   Culture   Final    NO GROWTH 5 DAYS Performed at [facility]    Report Status 05/19/2016 FINAL  Final  Body fluid culture     Status: None   Collection Time: 05/14/16  3:30 PM  Result Value Ref Range Status   Specimen Description PLEURAL  Final   Special Requests NONE  Final   Gram Stain   Final    MODERATE WBC PRESENT, PREDOMINANTLY MONONUCLEAR NO ORGANISMS SEEN    Culture   Final    NO GROWTH 3 DAYS Performed at [facility]    Report Status 05/18/2016 FINAL  Final  MRSA PCR Screening     Status: None   Collection Time: 05/16/16  4:33 PM  Result Value Ref Range Status   MRSA by PCR NEGATIVE NEGATIVE Final    Comment:        The GeneXpert MRSA Assay (FDA approved for NASAL specimens only), is one component of a comprehensive MRSA colonization surveillance program. It is not intended to  diagnose MRSA infection nor to guide or monitor treatment for MRSA infections.   Body fluid culture     Status: None  Collection Time: 05/17/16  5:07 PM  Result Value Ref Range Status   Specimen Description PLEURAL  Final   Special Requests NONE  Final   Gram Stain   Final    FEW WBC PRESENT,BOTH PMN AND MONONUCLEAR NO ORGANISMS SEEN    Culture   Final    No growth aerobically or anaerobically. Performed at [facility]    Report Status 05/20/2016 FINAL  Final  Aerobic Culture (superficial specimen)     Status: None (Preliminary result)   Collection Time: 05/20/16  8:28 AM  Result Value Ref Range Status   Specimen Description WOUND RIGHT LOWER LUNG  Final   Special Requests SWAB SPEC A RIGHT LOWER LOBE POF ZINACEF  Final   Gram Stain   Final    RARE WBC PRESENT, PREDOMINANTLY PMN NO ORGANISMS SEEN    Culture NO GROWTH 1 DAY  Final   Report Status PENDING  Incomplete  Acid Fast Smear (AFB)     Status: None   Collection Time: 05/20/16 10:34 AM  Result Value Ref Range Status   AFB Specimen Processing Concentration  Final   Acid Fast Smear Negative  Final    Comment: (NOTE) Performed At: [facility] [place], [place] [id number] Lindon Romp MD WU:[id number]    Source (AFB) FLUID  Final    Comment: RIGHT PLEURAL   Body fluid culture     Status: None (Preliminary result)   Collection Time: 05/20/16 10:34 AM  Result Value Ref Range Status   Specimen Description FLUID RIGHT PLEURAL  Final   Special Requests SPEC F IN TASP CONTAINER POF ZINACEF  Final   Gram Stain   Final    FEW WBC PRESENT,BOTH PMN AND MONONUCLEAR NO ORGANISMS SEEN    Culture NO GROWTH 2 DAYS  Final   Report Status PENDING  Incomplete  Culture, fungus without smear     Status: None (Preliminary result)   Collection Time: 05/20/16 10:37 AM  Result Value Ref Range Status   Specimen Description TISSUE RIGHT PLEURAL  Final   Special Requests POF ZINACEF  Final    Culture NO FUNGUS ISOLATED AFTER 1 DAY  Final   Report Status PENDING  Incomplete  Aerobic/Anaerobic Culture (surgical/deep wound)     Status: None (Preliminary result)   Collection Time: 05/20/16 10:37 AM  Result Value Ref Range Status   Specimen Description TISSUE RIGHT PLEURAL  Final   Special Requests POF ZINACEF  Final   Gram Stain   Final    MODERATE WBC PRESENT,BOTH PMN AND MONONUCLEAR NO ORGANISMS SEEN    Culture NO GROWTH 1 DAY  Final   Report Status PENDING  Incomplete  Acid Fast Smear (AFB)     Status: None   Collection Time: 05/20/16 10:37 AM  Result Value Ref Range Status   AFB Specimen Processing Comment  Final    Comment: Tissue Grinding and Digestion/Decontamination   Acid Fast Smear Negative  Final    Comment: (NOTE) Performed At: [facility] [place], [place] [id number] Lindon Romp MD VH:[id number]    Source (AFB) TISSUE  Final    Comment: RIGHT PLEURAL       Radiology Studies: Dg Chest 2 View Result Date: 05/17/2016 Cardiomegaly and bilateral pulmonary edema with increased right pleural effusion consistent with congestive heart failure.   Ct Chest Wo Contrast Result Date: 05/17/2016 1. Large complex right pleural effusion now appears loculated. There is a collection of gas within the posterior  basilar component of the loculated right pleural effusion which is worrisome for empyema. 2. Persistent enlarged mediastinal lymph nodes and a left internal mammary lymph node. Differential considerations include metastatic adenopathy, lymphoproliferative disorder, granulomatous inflammation/infection or (less likely) reactive adenopathy.   Dg Chest Port 1 View Result Date: 05/17/2016 Again noted mild congestion/interstitial edema bilaterally. Slight improvement in aeration right lower lung. Persistent small right pleural effusion with right basilar atelectasis or infiltrate. There is no pneumothorax. Electronically Signed   By: Lahoma Crocker M.D.    On: 05/17/2016 14:50    Scheduled Meds: . amiodarone  400 mg Oral BID  . aspirin  81 mg Oral Daily  . azithromycin  500 mg Oral Daily  . cefTRIAXone   1 g Intravenous Q24H  .  cefUROXime   1.5 g Intravenous To SSTC  . docusate sodium  200 mg Oral BID  . feeding supplement   237 mL Oral TID BM  . fluticasone  1 spray Each Nare Daily  . guaiFENesin  600 mg Oral BID  . ipratropium  0.5 mg Nebulization TID  . levalbuterol  1.25 mg Nebulization TID  . nicotine  21 mg Transdermal Daily  . potassium chloride  20 mEq Oral BID  . tamsulosin  0.4 mg Oral QPC breakfast   Continuous Infusions: . dextrose 5 % and 0.9% NaCl 10 mL/hr at 05/22/16 0700  . DOPamine 3 mcg/kg/min (05/22/16 0700)  . phenylephrine (NEO-SYNEPHRINE) Adult infusion 5 mcg/min (05/22/16 0700)     LOS: 8 days    Time spent: 25 minutes  Greater than 50% of the time spent on counseling and coordinating the care.   Leisa Lenz, MD [facility] Pager [phone]  If 7PM-7AM, please contact night-coverage [email or web address] Password [email or web address] 05/22/2016, 10:33 AM

## 2016-05-22 NOTE — Progress Notes (Signed)
2 Days Post-Op Procedure(s) (LRB): VIDEO ASSISTED THORACOSCOPY (VATS) with drainage of pleural effusion (Right) VIDEO BRONCHOSCOPY WITH ENDOBRONCHIAL ULTRASOUND (Right) Subjective: Status post right VATS for empyema, respiratory insufficiency History of active smoking and underlying COPD Subcarinal adenopathy on CT scan  Patient's blood pressure more stable past 24 hours and will wean dopamine as tolerated and continue with gentle diuresis Chest x-ray shows interstitial edema with some probable chronic fibrosis.  He remains on 5 L nasal cannula, able to walk in the hallway Operative cultures remain negative-continue coverage with bank and Zosyn Pathology on pleural biopsy and pleural peel consistent with empyema, no malignancy Loculated pleural fluid[2.5 L] with negative cytology Transbronchial biopsy of subcarinal lymph node shows granulomatous inflammatory change, no malignancy  Objective: Vital signs in last 24 hours: Temp:  [97.8 F (36.6 C)-98.6 F (37 C)] 98 F (36.7 C) (01/10 1100) Pulse Rate:  [68-92] 76 (01/10 0800) Cardiac Rhythm: Atrial fibrillation (01/10 0400) Resp:  [12-22] 19 (01/10 0840) BP: (77-120)/(53-88) 109/74 (01/10 0800) SpO2:  [89 %-100 %] 97 % (01/10 0840) Arterial Line BP: (100-166)/(46-74) 141/52 (01/10 0800) Weight:  [181 lb (82.1 kg)] 181 lb (82.1 kg) (01/10 0600)  Hemodynamic parameters for last 24 hours: CVP:  [6 mmHg-19 mmHg] 9 mmHg  Intake/Output from previous day: 01/09 0701 - 01/10 0700 In: 2123.2 [I.V.:1073.2; IV Piggyback:1050] Out: 1970 [Urine:1620; Chest Tube:350] Intake/Output this shift: Total I/O In: -  Out: 90 [Urine:90]  Scattered rhonchi No air leak from chest tubes Extremities warm with mild edema  Lab Results:  Recent Labs  05/21/16 0410 05/22/16 0345  WBC 15.6* 16.0*  HGB 10.6* 11.3*  HCT 32.1* 33.9*  PLT 342 331   BMET:  Recent Labs  05/21/16 0410 05/22/16 0345  NA 134* 133*  K 4.9 4.4  CL 100* 101  CO2  27 26  GLUCOSE 96 104*  BUN 28* 28*  CREATININE 1.23 1.17  CALCIUM 8.3* 8.2*    PT/INR: No results for input(s): LABPROT, INR in the last 72 hours. ABG    Component Value Date/Time   PHART 7.467 (H) 05/22/2016 0354   HCO3 29.7 (H) 05/22/2016 0354   TCO2 31 05/22/2016 0354   O2SAT 93.0 05/22/2016 0354   CBG (last 3)   Recent Labs  05/20/16 2313 05/21/16 0504 05/21/16 1241  GLUCAP 123* 92 112*    Assessment/Plan: S/P Procedure(s) (LRB): VIDEO ASSISTED THORACOSCOPY (VATS) with drainage of pleural effusion (Right) VIDEO BRONCHOSCOPY WITH ENDOBRONCHIAL ULTRASOUND (Right) Mobilize Diuresis Patient is to remain in ICU because of high oxygen requirement   LOS: 8 days    [name] 05/22/2016

## 2016-05-23 ENCOUNTER — Inpatient Hospital Stay (HOSPITAL_COMMUNITY): Payer: Medicare Other

## 2016-05-23 DIAGNOSIS — J9601 Acute respiratory failure with hypoxia: Secondary | ICD-10-CM

## 2016-05-23 LAB — URINALYSIS, ROUTINE W REFLEX MICROSCOPIC
Bacteria, UA: NONE SEEN
Bilirubin Urine: NEGATIVE
GLUCOSE, UA: NEGATIVE mg/dL
Hgb urine dipstick: NEGATIVE
Ketones, ur: NEGATIVE mg/dL
Leukocytes, UA: NEGATIVE
Nitrite: NEGATIVE
PH: 5 (ref 5.0–8.0)
Specific Gravity, Urine: 1.03 (ref 1.005–1.030)

## 2016-05-23 LAB — TYPE AND SCREEN
Blood Product Expiration Date: 201801212359
Blood Product Expiration Date: 201801212359
Unit Type and Rh: 6200
Unit Type and Rh: 6200

## 2016-05-23 LAB — COMPREHENSIVE METABOLIC PANEL
ALT: 22 U/L (ref 17–63)
AST: 26 U/L (ref 15–41)
Albumin: 2.3 g/dL — ABNORMAL LOW (ref 3.5–5.0)
Alkaline Phosphatase: 176 U/L — ABNORMAL HIGH (ref 38–126)
Anion gap: 8 (ref 5–15)
BUN: 29 mg/dL — ABNORMAL HIGH (ref 6–20)
CO2: 26 mmol/L (ref 22–32)
Calcium: 8.3 mg/dL — ABNORMAL LOW (ref 8.9–10.3)
Chloride: 100 mmol/L — ABNORMAL LOW (ref 101–111)
Creatinine, Ser: 1.14 mg/dL (ref 0.61–1.24)
GFR calc Af Amer: >60 mL/min (ref >60)
GFR calc non Af Amer: >60 mL/min (ref >60)
Glucose, Bld: 119 mg/dL — ABNORMAL HIGH (ref 65–99)
Potassium: 4.3 mmol/L (ref 3.5–5.1)
Sodium: 134 mmol/L — ABNORMAL LOW (ref 135–145)
Total Bilirubin: 0.9 mg/dL (ref 0.3–1.2)
Total Protein: 5.3 g/dL — ABNORMAL LOW (ref 6.5–8.1)

## 2016-05-23 LAB — BODY FLUID CULTURE: Culture: NO GROWTH

## 2016-05-23 LAB — CBC
HCT: 32.5 % — ABNORMAL LOW (ref 39.0–52.0)
Hemoglobin: 10.6 g/dL — ABNORMAL LOW (ref 13.0–17.0)
MCH: 29.4 pg (ref 26.0–34.0)
MCHC: 32.6 g/dL (ref 30.0–36.0)
MCV: 90 fL (ref 78.0–100.0)
PLATELETS: 359 10*3/uL (ref 150–400)
RBC: 3.61 MIL/uL — ABNORMAL LOW (ref 4.22–5.81)
RDW: 14.5 % (ref 11.5–15.5)
WBC: 15.9 10*3/uL — ABNORMAL HIGH (ref 4.0–10.5)

## 2016-05-23 MED ORDER — WARFARIN SODIUM 2.5 MG PO TABS
2.5000 mg | ORAL_TABLET | Freq: Every day | ORAL | Status: DC
  Administered 2016-05-23: 2.5 mg via ORAL
  Filled 2016-05-23: qty 1

## 2016-05-23 MED ORDER — FUROSEMIDE 10 MG/ML IJ SOLN
20.0000 mg | Freq: Every day | INTRAMUSCULAR | Status: DC
  Filled 2016-05-23: qty 2

## 2016-05-23 MED ORDER — SORBITOL 70 % SOLN
30.0000 mL | Freq: Once | Status: AC
  Administered 2016-05-23: 30 mL via ORAL
  Filled 2016-05-23: qty 30

## 2016-05-23 MED ORDER — ALBUMIN HUMAN 25 % IV SOLN
12.5000 g | Freq: Four times a day (QID) | INTRAVENOUS | Status: AC
  Administered 2016-05-23 (×2): 12.5 g via INTRAVENOUS
  Filled 2016-05-23 (×2): qty 50

## 2016-05-23 MED ORDER — VASOPRESSIN 20 UNIT/ML IV SOLN
0.0200 [IU]/min | INTRAVENOUS | Status: DC
  Administered 2016-05-23: 0.02 [IU]/min via INTRAVENOUS
  Filled 2016-05-23: qty 2

## 2016-05-23 MED ORDER — WARFARIN - PHYSICIAN DOSING INPATIENT
Freq: Every day | Status: DC
  Administered 2016-05-25 – 2016-05-28 (×4)

## 2016-05-23 MED ORDER — GUAIFENESIN ER 600 MG PO TB12
1200.0000 mg | ORAL_TABLET | Freq: Two times a day (BID) | ORAL | Status: DC
Start: 2016-05-23 — End: 2016-05-31
  Administered 2016-05-23 – 2016-05-31 (×17): 1200 mg via ORAL
  Filled 2016-05-23 (×17): qty 2

## 2016-05-23 MED ORDER — FLUDROCORTISONE ACETATE 0.1 MG PO TABS
0.1000 mg | ORAL_TABLET | Freq: Every day | ORAL | Status: DC
  Administered 2016-05-23 – 2016-05-26 (×4): 0.1 mg via ORAL
  Filled 2016-05-23 (×4): qty 1

## 2016-05-23 MED ORDER — FUROSEMIDE 10 MG/ML IJ SOLN
20.0000 mg | Freq: Two times a day (BID) | INTRAMUSCULAR | Status: DC
  Administered 2016-05-23: 20 mg via INTRAVENOUS

## 2016-05-23 MED ORDER — BISACODYL 10 MG RE SUPP
10.0000 mg | Freq: Once | RECTAL | Status: AC
  Administered 2016-05-23: 10 mg via RECTAL
  Filled 2016-05-23: qty 1

## 2016-05-23 NOTE — Care Management Note (Signed)
Case Management Note  Patient Details  Name: [name] MRN: [id number] Date of Birth: 06/18/1945  Subjective/Objective:  S/p Right VATS for effusion/ empyema                   Action/Plan:  PTA independent from home with wife.  CM will continue to follow for discharge needs   Expected Discharge Date:                  Expected Discharge Plan:  [place]  In-House Referral:  NA  Discharge planning Services  CM Consult  Post Acute Care Choice:    Choice offered to:  NA  DME Arranged:    DME Agency:     HH Arranged:    HH Agency:     Status of Service:  In process, will continue to follow  If discussed at Long Length of Stay Meetings, dates discussed:    Additional Comments: 05/23/2016  Pt alert and oriented on nasal cannula.  Pt states he was completely independent PTA.  Pt educated on the importance of both daily weights and low sodium diet.  CM ordered post VATS PT eval.  CM will continue to follow for discharge needs  05/21/16 Pt BP dropped overnight - required fluids and pressors.  Pt will need home oxygen at discharge - Pt remains on pressors with high output Chest tubes.  CM will continue to follow for discharge needs and arrange HH/DME prior to discharge. Maryclare Labrador, RN 05/23/2016, 10:41 AM

## 2016-05-23 NOTE — Progress Notes (Signed)
Fentanyl 52m flushed down sink witnessed by TBenjaman Pott RN.

## 2016-05-23 NOTE — Progress Notes (Signed)
[facility] for Vanco/Zosyn Indication: Empyema  Allergies  Allergen Reactions  . Demerol [Meperidine] Other (See Comments)    UNSPECIFIED REACTION  Causes system to shutdown. ?     Patient Measurements: Height: _0  (180.3 cm) Weight: 181 lb (82.1 kg) IBW/kg (Calculated) : 75.3 Adjusted Body Weight:    Vital Signs: Temp: 97.7 F (36.5 C) (01/11 1140) Temp Source: Oral (01/11 1140) BP: 102/54 (01/11 1200) Pulse Rate: 67 (01/11 1200) Intake/Output from previous day: 01/10 0701 - 01/11 0700 In: 1791.1 [P.O.:960; I.V.:331.1; IV Piggyback:500] Out: 9233 [Urine:1750; Chest Tube:100] Intake/Output from this shift: Total I/O In: 376.7 [I.V.:126.7; IV Piggyback:250] Out: 570 [Urine:500; Chest Tube:70]  Labs:  Recent Labs  05/21/16 0410 05/22/16 0345 05/23/16 0350  WBC 15.6* 16.0* 15.9*  HGB 10.6* 11.3* 10.6*  PLT 342 331 359  CREATININE 1.23 1.17 1.14   Estimated Creatinine Clearance: 62.4 mL/min (by C-G formula based on SCr of 1.14 mg/dL). No results for input(s): VANCOTROUGH, VANCOPEAK, VANCORANDOM, GENTTROUGH, GENTPEAK, GENTRANDOM, TOBRATROUGH, TOBRAPEAK, TOBRARND, AMIKACINPEAK, AMIKACINTROU, AMIKACIN in the last 72 hours.   Microbiology:   Medical History: Past Medical History:  Diagnosis Date  . Arthritis    "neck" (08/15/2015)  . Chronic bronchitis ([place])   . Constipation   . COPD (chronic obstructive pulmonary disease) ([place])   . DDD (degenerative disc disease), cervical   . Edema of left lower extremity   . Facial basal cell cancer   . H/O acne vulgaris 1960s   "led to my discharge from the [facility] in the mid 1960s"  . Headache    history of - left temporal- years ago- not current (08/15/2015)  . Pneumonia 1960s; 2015 X 2  . Prostate cancer ([place]) dx'd early 2000s   "low spreading; non aggressive type" (08/15/2015)  . Skin cancer    "back"   Assessment:  ID: Vanco/Zosyn. Continues on abx s/p R VATs for empyema with drain  in place. S/p abx for CAP. Drain with 1321m/24h of output declining . Afeb, WBC 15.9 (on Solucortef). Renal function stable.  Antimicrobials this admission:  Cefepime x1 ED Vancomycin x1 ED; 1/9 >> Rocephin 1/2 >> 1/8 Zmax 1/2 >> 1/8 Zosyn 1/9 >>  Microbiology results:  1/2 BCx: ngF 1/2 Strep pneumo: neg 1/2 Pleural fluid: ngF 1/8 Body fluid cx (pleural): pending 1/8 Wound Cx (R lower lung): pending 1/8 Tissue Cx (pleural): pending 1/8 Fungus cx: pending   Goal of Therapy:  Vancomycin trough level 15-20 mcg/ml  Plan:  Continue Zosyn 3.375 gm IV q8h (4 hour infusion) Continue vancomycin 1g IV Q12-level tonight Lovenox 460md Coumadin per MD 2.21m8m1800. F/u baseline INR.   Keighley Deckman S. RobAlford HighlandharmD, BCPS Clinical Staff Pharmacist Pager [phone] Ghaziillinger 05/23/2016,12:50 PM

## 2016-05-23 NOTE — Procedures (Signed)
Central Venous Catheter Insertion Procedure Note AVIN GIBBONS [id number] [birth year]  Procedure: Insertion of Central Venous Catheter Indications: cvvhd  Procedure Details Consent: Risks of procedure as well as the alternatives and risks of each were explained to the (patient/caregiver).  Consent for procedure obtained. Time Out: Verified patient identification, verified procedure, site/side was marked, verified correct patient position, special equipment/implants available, medications/allergies/relevent history reviewed, required imaging and test results available.  Performed  Maximum sterile technique was used including antiseptics, cap, gloves, gown, hand hygiene, mask and sheet. Skin prep: Chlorhexidine; local anesthetic administered A antimicrobial bonded/coated triple lumen catheter was placed in the left internal jugular vein using the Seldinger technique.  Evaluation Blood flow good Complications: No apparent complications Patient did tolerate procedure well. Chest X-ray ordered to verify placement.  CXR: pending.  Raylene Miyamoto 05/23/2016, 11:58 AM  [place]  Athol Bolds J. Titus Mould, [place] Pgr: [facility]   Plat tx prior

## 2016-05-23 NOTE — Progress Notes (Signed)
3 Days Post-Op Procedure(s) (LRB): VIDEO ASSISTED THORACOSCOPY (VATS) with drainage of pleural effusion (Right) VIDEO BRONCHOSCOPY WITH ENDOBRONCHIAL ULTRASOUND (Right) Subjective: Status post right VATS for drainage and decortication of empyema Pathology on empyema peel, pleural fluid, subcarinal node all negative for tumor consistent with inflammation-infection Chest x-ray shows acute on chronic lung injury Patient started on IV steroids Oxygenation remained tenuous and he is now on a oxygen mask Prior CTA showed no evidence of PE Patient with poor nutritional intake-may need feeding tube Gentle diuresis to help improve oxygenation Chronic atrial fibrillation rate control-starting oral Coumadin Operative cultures remain no growth Patient remains on low-dose dopamine for blood pressure support  Objective: Vital signs in last 24 hours: Temp:  [97.2 F (36.2 C)-98.8 F (37.1 C)] 97.2 F (36.2 C) (01/11 0731) Pulse Rate:  [67-95] 78 (01/11 0800) Cardiac Rhythm: Atrial fibrillation (01/11 0800) Resp:  [11-38] 20 (01/11 0800) BP: (90-136)/(55-83) 106/57 (01/11 0800) SpO2:  [74 %-100 %] 97 % (01/11 0800) FiO2 (%):  [50 %] 50 % (01/11 0800)  Hemodynamic parameters for last 24 hours:    Intake/Output from previous day: 01/10 0701 - 01/11 0700 In: 1791.1 [P.O.:960; I.V.:331.1; IV Piggyback:500] Out: 2840 [Urine:1750; Chest Tube:100] Intake/Output this shift: Total I/O In: 12.9 [I.V.:12.9] Out: 75 [Urine:75]  Chronically ill on 50% Ventimask Mildly coarse breath sounds bilaterally Serous chest tube drainage-1 50 cc Right VATS incision healing well, no airleak Extremities warm abdomen nondistended  Lab Results:  Recent Labs  05/22/16 0345 05/23/16 0350  WBC 16.0* 15.9*  HGB 11.3* 10.6*  HCT 33.9* 32.5*  PLT 331 359   BMET:  Recent Labs  05/22/16 0345 05/23/16 0350  NA 133* 134*  K 4.4 4.3  CL 101 100*  CO2 26 26  GLUCOSE 104* 119*  BUN 28* 29*  CREATININE  1.17 1.14  CALCIUM 8.2* 8.3*    PT/INR: No results for input(s): LABPROT, INR in the last 72 hours. ABG    Component Value Date/Time   PHART 7.467 (H) 05/22/2016 0354   HCO3 29.7 (H) 05/22/2016 0354   TCO2 31 05/22/2016 0354   O2SAT 93.0 05/22/2016 0354   CBG (last 3)   Recent Labs  05/20/16 2313 05/21/16 0504 05/21/16 1241  GLUCAP 123* 92 112*    Assessment/Plan: S/P Procedure(s) (LRB): VIDEO ASSISTED THORACOSCOPY (VATS) with drainage of pleural effusion (Right) VIDEO BRONCHOSCOPY WITH ENDOBRONCHIAL ULTRASOUND (Right) Continue IV Vanco and Zosyn and steroids Patient may benefit from a chest vest for vibration Tenuous respiratory status, may soon need BiPAP Leave remaining chest tube in place today  LOS: 9 days    Tharon Aquas Trigt III 05/23/2016

## 2016-05-23 NOTE — Progress Notes (Addendum)
Name: ULYSSES ALPER MRN: [id number] DOB: Jan 05, 1946    ADMISSION DATE:  05/13/2016 CONSULTATION DATE:  05/21/16  REFERRING MD :  Darcey Nora  CHIEF COMPLAINT:  Hypotension  Brief:    71 year old male with PMH of chronic bronchitis, COPD, and prostate cancer (early 2000s).  Admitted to [facility]/14/16 with SOB. CTA revealed subcarinal mass and large right sided pleural effusion.  Significant Events  1/1 > admit. 1/2 > cards consult for A.fib > started on heparin 01/03. 1/2 > Thoracentesis 1530m removed > exudative / parapneumonic. 1/5 >  right thora with 1278m> thick septations noted on [place] referred for VATS. 1/8 > [facility], he was taken to the OR for VATS and bx of subcarinal mass > hypotension > not fluid responsive  > dopamine  01/09 > PCCM re-consulted.  SUBJECTIVE:  Remains on dopamine. Neg 50 cc  VITAL SIGNS: Temp:  [97.2 F (36.2 C)-98.8 F (37.1 C)] 97.2 F (36.2 C) (01/11 0731) Pulse Rate:  [67-95] 78 (01/11 0800) Resp:  [11-38] 20 (01/11 0800) BP: (90-136)/(55-83) 106/57 (01/11 0800) SpO2:  [74 %-100 %] 97 % (01/11 0800) FiO2 (%):  [50 %] 50 % (01/11 0800)  PHYSICAL EXAMINATION: General: Elderly appearing male, sitting in chair, no distress did walk Neuro: alert and oriented, follows commands  HEENT: PERRL Cardiovascular: IRIR, no M/R/G Lungs: Respirations even and unlabored.  Coarse BS improved Abdomen: BS x 4, soft, NT/ND.  Musculoskeletal: No gross deformities, no edema.  Skin: Intact, warm, no rashes.     Recent Labs Lab 05/21/16 0410 05/22/16 0345 05/23/16 0350  NA 134* 133* 134*  K 4.9 4.4 4.3  CL 100* 101 100*  CO2 _0 BUN 28* 28* 29*  CREATININE 1.23 1.17 1.14  GLUCOSE 96 104* 119*    Recent Labs Lab 05/21/16 0410 05/22/16 0345 05/23/16 0350  HGB 10.6* 11.3* 10.6*  HCT 32.1* 33.9* 32.5*  WBC 15.6* 16.0* 15.9*  PLT 342 331 359   Dg Chest Port 1 View  Result Date: 05/23/2016 CLINICAL DATA:  Shortness  of Breath EXAM: PORTABLE CHEST 1 VIEW COMPARISON:  05/22/2016 FINDINGS: Cardiac shadow remains enlarged. Diffuse airspace disease is again identified and stable. One of the right chest tubes has been removed. No recurrent pneumothorax is seen. Persistent basilar right chest tube is noted. No bony abnormality is seen. IMPRESSION: Stable bilateral airspace without significant change. No pneumothorax following right chest tube removal. Electronically Signed   By: MaInez Catalina.D.   On: 05/23/2016 07:15   Dg Chest Port 1 View  Result Date: 05/22/2016 CLINICAL DATA:  Chest 2, followup EXAM: PORTABLE CHEST 1 VIEW COMPARISON:  Portable chest x-ray of 05/21/2016 FINDINGS: There is little change to perhaps minimal improvement in diffuse airspace disease. Also there appear to be bilateral pleural effusions present. Right chest tube noted and no definite pneumothorax is seen. Mild cardiomegaly is stable. Right IJ central venous line tip overlies the mid lower SVC. IMPRESSION: 1. Little change to minimal improvement in diffuse airspace disease. 2. Two right chest tubes remain.  No pneumothorax. 3. Probable bilateral effusions. Electronically Signed   By: PaIvar Drape.D.   On: 05/22/2016 08:34    STUDIES:  CTA chest 01/02 > no PE.  Large right sided effusion, small left. 6.9cm subcarinal mass with mod mediastinal adenopathy.   ASSESSMENT / PLAN:  Acute hypoxic respiratory failure - multifactorial in setting of large right parapneumonic effusion (s/p VATS 01/08), COPD,  atelectasis. Large right parapneumonic pleural effusion - s/p thora 01/02 and 01/05, then referred to CVTS and is now s/p VATS 01/08. 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 PAH - PAP 43 per echo 01/02. Tobacco dependence. Plan: pcxr this am under penetrated Lasix per cvts, was neg 50 cc Continue BD's. Mobilize as able. Incentive spirometry / flutter valve. pcxr in am   Hypotension (  rel AI, intravasc low, sepsis) He perfusing well at sys 90, map 55 Plan: MAP goal 55 , sys 90 Dc dopamine and assess neurostatus and urine output If remains on dop by afternoon will add vasopressin for SIRS / "less severe septic shock" Maintain stress steroids Consider dc albumin, not effective Add florinef  New A.fib. 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 Plan: Continue lovenox - defer to CVTS   ID Empyema -culture neg Consider empiric course polymicrobial zosyn  Ccm time 30 min    Lavon Paganini. Titus Mould, MD, [place] Pgr: [facility] 05/23/2016 9:32 AM

## 2016-05-23 NOTE — Progress Notes (Signed)
*  PRELIMINARY RESULTS* Vascular Ultrasound Lower extremity venous duplex has been completed.  Preliminary findings: No evidence of DVT or baker's cyst.  Landry Mellow, RDMS, RVT  05/23/2016, 4:27 PM

## 2016-05-23 NOTE — Progress Notes (Signed)
CT Surgery  Resting comfortably O2 sat 94% on nasal cannula 6L Cont vasopressin .o2  for SAP > 100

## 2016-05-24 ENCOUNTER — Ambulatory Visit: Payer: Self-pay | Admitting: Cardiology

## 2016-05-24 ENCOUNTER — Inpatient Hospital Stay (HOSPITAL_COMMUNITY): Payer: Medicare Other

## 2016-05-24 DIAGNOSIS — A419 Sepsis, unspecified organism: Secondary | ICD-10-CM

## 2016-05-24 DIAGNOSIS — J869 Pyothorax without fistula: Principal | ICD-10-CM

## 2016-05-24 DIAGNOSIS — R6521 Severe sepsis with septic shock: Secondary | ICD-10-CM

## 2016-05-24 LAB — VANCOMYCIN, TROUGH: Vancomycin Tr: 24 ug/mL (ref 15–20)

## 2016-05-24 LAB — CBC
HEMATOCRIT: 30.2 % — AB (ref 39.0–52.0)
HEMOGLOBIN: 9.9 g/dL — AB (ref 13.0–17.0)
MCH: 29.6 pg (ref 26.0–34.0)
MCHC: 32.8 g/dL (ref 30.0–36.0)
MCV: 90.1 fL (ref 78.0–100.0)
Platelets: 337 10*3/uL (ref 150–400)
RBC: 3.35 MIL/uL — AB (ref 4.22–5.81)
RDW: 14.6 % (ref 11.5–15.5)
WBC: 14.2 10*3/uL — AB (ref 4.0–10.5)

## 2016-05-24 LAB — COMPREHENSIVE METABOLIC PANEL
ALT: 21 U/L (ref 17–63)
AST: 24 U/L (ref 15–41)
Albumin: 2.4 g/dL — ABNORMAL LOW (ref 3.5–5.0)
Alkaline Phosphatase: 157 U/L — ABNORMAL HIGH (ref 38–126)
Anion gap: 8 (ref 5–15)
BUN: 32 mg/dL — ABNORMAL HIGH (ref 6–20)
CO2: 27 mmol/L (ref 22–32)
Calcium: 8.3 mg/dL — ABNORMAL LOW (ref 8.9–10.3)
Chloride: 99 mmol/L — ABNORMAL LOW (ref 101–111)
Creatinine, Ser: 1.13 mg/dL (ref 0.61–1.24)
GFR calc Af Amer: >60 mL/min (ref >60)
GFR calc non Af Amer: >60 mL/min (ref >60)
Glucose, Bld: 153 mg/dL — ABNORMAL HIGH (ref 65–99)
Potassium: 4.1 mmol/L (ref 3.5–5.1)
Sodium: 134 mmol/L — ABNORMAL LOW (ref 135–145)
Total Bilirubin: 0.7 mg/dL (ref 0.3–1.2)
Total Protein: 5.1 g/dL — ABNORMAL LOW (ref 6.5–8.1)

## 2016-05-24 LAB — PROTIME-INR
INR: 1.21
PROTHROMBIN TIME: 15.4 s — AB (ref 11.4–15.2)

## 2016-05-24 MED ORDER — WARFARIN SODIUM 4 MG PO TABS
4.0000 mg | ORAL_TABLET | Freq: Once | ORAL | Status: DC
  Filled 2016-05-24: qty 1

## 2016-05-24 MED ORDER — WARFARIN SODIUM 2.5 MG PO TABS
4.0000 mg | ORAL_TABLET | Freq: Every day | ORAL | Status: DC
  Filled 2016-05-24: qty 0.5

## 2016-05-24 MED ORDER — VANCOMYCIN HCL IN DEXTROSE 750-5 MG/150ML-% IV SOLN
750.0000 mg | Freq: Two times a day (BID) | INTRAVENOUS | Status: DC
  Administered 2016-05-24: 750 mg via INTRAVENOUS
  Filled 2016-05-24: qty 150

## 2016-05-24 MED ORDER — WARFARIN SODIUM 4 MG PO TABS
4.0000 mg | ORAL_TABLET | Freq: Every day | ORAL | Status: DC
  Administered 2016-05-24 – 2016-05-30 (×7): 4 mg via ORAL
  Filled 2016-05-24 (×9): qty 1

## 2016-05-24 MED ORDER — FUROSEMIDE 10 MG/ML IJ SOLN
20.0000 mg | Freq: Once | INTRAMUSCULAR | Status: AC
  Administered 2016-05-24: 20 mg via INTRAVENOUS
  Filled 2016-05-24: qty 2

## 2016-05-24 MED ORDER — POTASSIUM CHLORIDE 20 MEQ/15ML (10%) PO SOLN
20.0000 meq | Freq: Once | ORAL | Status: AC
  Administered 2016-05-24: 20 meq via ORAL
  Filled 2016-05-24: qty 15

## 2016-05-24 NOTE — Progress Notes (Signed)
4 Days Post-Op Procedure(s) (LRB): VIDEO ASSISTED THORACOSCOPY (VATS) with drainage of pleural effusion (Right) VIDEO BRONCHOSCOPY WITH ENDOBRONCHIAL ULTRASOUND (Right) Subjective: Overall improvement over the past 24 hours Improved oxygenation Improved oral intake and nutrition Right chest tube drainage still significant and will remain Patient back to ambulating in hallway Blood pressure better, vasopressin weaned off Continue IV Zosyn and vancomycin for empyema Coumadin being initiated for new diagnosis of atrial fibrillation Objective: Vital signs in last 24 hours: Temp:  [97.4 F (36.3 C)-97.9 F (36.6 C)] 97.4 F (36.3 C) (01/12 1606) Pulse Rate:  [39-77] 53 (01/12 1500) Cardiac Rhythm: Atrial fibrillation (01/12 0800) Resp:  [11-25] 12 (01/12 1500) BP: (90-113)/(57-80) 106/68 (01/12 1500) SpO2:  [96 %-100 %] 100 % (01/12 1500)  Hemodynamic parameters for last 24 hours:  chronic atrial fibrillation rate controlled  Intake/Output from previous day: 01/11 0701 - 01/12 0700 In: 2122.8 [P.O.:720; I.V.:1002.8; IV Piggyback:400] Out: 2560 [Urine:2160; Chest Tube:400] Intake/Output this shift: Total I/O In: 410 [I.V.:185; IV Piggyback:225] Out: 650 [Urine:650]       Exam    General- alert and comfortable   Lungs- clear without rales, wheezes   Cor- regular rate and rhythm, no murmur , gallop   Abdomen- soft, non-tender   Extremities - warm, non-tender, minimal edema   Neuro- oriented, appropriate, no focal weakness   Lab Results:  Recent Labs  05/23/16 0350 05/24/16 0420  WBC 15.9* 14.2*  HGB 10.6* 9.9*  HCT 32.5* 30.2*  PLT 359 337   BMET:  Recent Labs  05/23/16 0350 05/24/16 0420  NA 134* 134*  K 4.3 4.1  CL 100* 99*  CO2 26 27  GLUCOSE 119* 153*  BUN 29* 32*  CREATININE 1.14 1.13  CALCIUM 8.3* 8.3*    PT/INR:  Recent Labs  05/24/16 0420  LABPROT 15.4*  INR 1.21   ABG    Component Value Date/Time   PHART 7.467 (H) 05/22/2016 0354   HCO3 29.7 (H) 05/22/2016 0354   TCO2 31 05/22/2016 0354   O2SAT 93.0 05/22/2016 0354   CBG (last 3)  No results for input(s): GLUCAP in the last 72 hours.  Assessment/Plan: S/P Procedure(s) (LRB): VIDEO ASSISTED THORACOSCOPY (VATS) with drainage of pleural effusion (Right) VIDEO BRONCHOSCOPY WITH ENDOBRONCHIAL ULTRASOUND (Right) Continue with Coumadin load and stop Lovenox when INR 1.8 Encourage oral intake and ambulation Remove chest tube when drainage less than 100 cc per day  LOS: 10 days    [name] 05/24/2016

## 2016-05-24 NOTE — Progress Notes (Signed)
Pharmacy Antibiotic Note  [name] is a 71 y.o. male admitted on 05/13/2016.  Pharmacy has been consulted for vancomycin dosing for empyema.  Vanc trough above goal.  Plan: Vancomycin 787m IV every 12 hours.  Goal trough 15-20 mcg/mL. (calculated trough ~19 at this dose given trough w/ 1g)  Height: _0  (180.3 cm) Weight: 181 lb (82.1 kg) IBW/kg (Calculated) : 75.3  Temp (24hrs), Avg:97.8 F (36.6 C), Min:97.2 F (36.2 C), Max:98.8 F (37.1 C)   Recent Labs Lab 05/19/16 1038 05/20/16 0320 05/20/16 2115 05/21/16 0410 05/21/16 1300 05/22/16 0345 05/23/16 0350 05/24/16 0030  WBC  --  13.9* 20.1* 15.6*  --  16.0* 15.9*  --   CREATININE 0.84 0.79  --  1.23  --  1.17 1.14  --   LATICACIDVEN  --   --   --   --  0.7  --   --   --   VANCOTROUGH  --   --   --   --   --   --   --  24*    Estimated Creatinine Clearance: 62.4 mL/min (by C-G formula based on SCr of 1.14 mg/dL).    Allergies  Allergen Reactions  . Demerol [Meperidine] Other (See Comments)    UNSPECIFIED REACTION  Causes system to shutdown. ?     Thank you for allowing pharmacy to be a part of this patient's care.  VWynona Neat PharmD, BCPS  05/24/2016 1:49 AM

## 2016-05-24 NOTE — Progress Notes (Signed)
Follow up - Critical Care Medicine Note  Patient Details:    [name] is an 71 y.o. male w/ h/o COPD and prostate CA. We saw initially in consult at [facility] for large subcarinal mass, CAP (NOS) and eventually what was identified as complicated pleural effusion/empyema for which we referred to thoracic surgery    Subjective:    Overnight Issues:  Now day 4 post op for VATS and Endobronchial [place] bx of lung mass Had episode of desaturation but this improved after cough and large mucous expectorated  Objective:  Vital signs for last 24 hours: Temp:  [97.4 F (36.3 C)-97.8 F (36.6 C)] 97.4 F (36.3 C) (01/12 0838) Pulse Rate:  [39-96] 58 (01/12 0900) Resp:  [11-25] 18 (01/12 0900) BP: (86-113)/(52-80) 108/80 (01/12 0900) SpO2:  [96 %-100 %] 100 % (01/12 0900) 4 liters  Hemodynamic parameters for last 24 hours:    Intake/Output Summary (Last 24 hours) at 05/24/16 0950 Last data filed at 05/24/16 0700  Gross per 24 hour  Intake          2099.93 ml  Output             2485 ml  Net          -385.07 ml     Vent settings for last 24 hours:  Physical Exam:  General: alert and no respiratory distress Neuro: alert, oriented and nonfocal exam HEENT/Neck: no JVD and mmm, Right IJ CVL unremarkable. has some petichea around his right eye. this is resolving.  Resp: rales posterior - bilateral and no accessory use. Rigth Chest tube w/ out airleak, good tidal in CT. still w/ serous output.  CVS: IRR and af on tele  GI: soft, nontender, BS WNL, no r/g Skin: no rash Extremities: no edema, no erythema, pulses WNL CBC Recent Labs     05/22/16  0345  05/23/16  0350  05/24/16  0420  WBC  16.0*  15.9*  14.2*  HGB  11.3*  10.6*  9.9*  HCT  33.9*  32.5*  30.2*  PLT  331  359  337    Coag's Recent Labs     05/24/16  0420  INR  1.21    BMET Recent Labs     05/22/16  0345  05/23/16  0350  05/24/16  0420  NA  133*  134*  134*  K  4.4  4.3  4.1  CL  101  100*  99*  CO2  _0 BUN  28*  29*  32*  CREATININE  1.17  1.14  1.13  GLUCOSE  104*  119*  153*    Electrolytes Recent Labs     05/22/16  0345  05/23/16  0350  05/24/16  0420  CALCIUM  8.2*  8.3*  8.3*    Sepsis Markers No results for input(s): PROCALCITON, O2SATVEN in the last 72 hours.  Invalid input(s): LACTICACIDVEN  ABG Recent Labs     05/22/16  0354  PHART  7.467*  PCO2ART  40.9  PO2ART  62.0*    Liver Enzymes Recent Labs     05/22/16  0345  05/23/16  0350  05/24/16  0420  AST  _1 ALT  _2 ALKPHOS  198*  176*  157*  BILITOT  0.7  0.9  0.7  ALBUMIN  2.1*  2.3*  2.4*    Cardiac Enzymes No results for input(s): TROPONINI,  PROBNP in the last 72 hours.  Glucose Recent Labs     05/21/16  1241  GLUCAP  112*    Imaging Dg Chest Port 1 View  Result Date: 05/24/2016 CLINICAL DATA:  Shortness of breath today.  Right chest tube. EXAM: PORTABLE CHEST 1 VIEW COMPARISON:  05/23/2016 and previous FINDINGS: Right internal jugular central line tip is in the SVC above the right atrium. Right chest tube is in the inferior pleural space as seen yesterday. No pneumothorax. Enlarged cardiac silhouette again demonstrated. Slowly diminishing diffuse pulmonary density consistent with improving edema and/or pneumonia. No worsening or new finding. IMPRESSION: Slow radiographic improvement of diffuse lung density. No pneumothorax with right chest tube unchanged at the inferior pleural space. Electronically Signed   By: Nelson Chimes M.D.   On: 05/24/2016 07:30   Dg Chest Port 1 View  Result Date: 05/23/2016 CLINICAL DATA:  Shortness of Breath EXAM: PORTABLE CHEST 1 VIEW COMPARISON:  05/22/2016 FINDINGS: Cardiac shadow remains enlarged. Diffuse airspace disease is again identified and stable. One of the right chest tubes has been removed. No recurrent pneumothorax is seen. Persistent basilar right chest tube is noted. No bony abnormality is seen. IMPRESSION: Stable bilateral  airspace without significant change. No pneumothorax following right chest tube removal. Electronically Signed   By: Inez Catalina M.D.   On: 05/23/2016 07:15  no PTX. Right CT still in place. Diffuse bilateral airspace disease c/w residual ALI  Significant Events  1/1 > admit. 1/2 > cards consult for A.fib > started on heparin 01/03. 1/2 > Thoracentesis 1546m removed > exudative / parapneumonic. 1/5 >  right thora with 1228m> thick septations noted on [place] referred for VATS. 1/8 > [facility], he was taken to the OR for VATS and bx of subcarinal mass > hypotension > not fluid responsive  > dopamine  01/09 > PCCM re-consulted.   Assessment/Plan:  Acute hypoxic respiratory failure in setting of CAP (NOS) c/b complicated right pleural effusion/empyema w/ acute lung injury superimposed on probable COPD.  Now day #4 post-op VATS.  O2 requirements better. Still w/ occasional mucous production.  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  >good end-organ perfusion w/ MAP goal 55 and SBP >90 Plan Cont stress dose steroids and florinef  Dc vasopressin  Accept SBP >90 and MAP >55  New onset AF Acute diastolic HF Plan Cont coumadin Cont tele   Mild anemia of Critical illness Plan Trend CBC Cont LMWH and coumadin   Summary  Slowly improving s/p VATS. HAs component of residual ALI which is likely contributing also to his O2 needs. Will dc vasopressin today. He is making slow improvement. Should be able to go to  SDU 1/13 if stays off pressors.   My ccm time 35 minutes  PetErick ColaceNP-BC LebFlemingger # [phone] # [phone] no answer  Attending Note:  I have examined patient, reviewed labs, studies and notes. I have discussed the case with P BJerrye Bushynd I  agree with the data and plans as amended above. 71 yo man with hx COPD, prostate CA. Very interesting case of subcarinal mass and complicated R pleural space. He underwent EBUS sampling of subcarinal node, R VATS decortication. The path showed inflammation and granulomatous disease in the node. Etiology unclear. He was hypotensive post-op, consistent w septic shock in setting CAP as well as adrenal insufficiency, new onset A fib. Today he is awake, appears comfortable. Still has a chest tube at -20cm h2O - no air leak on my eval, coarse breath sounds. We will workl to wean hios pressors off this am, hopefully out of ICU soon. Continue current abx.   Independent critical care time is 33 minutes.   Baltazar Apo, MD, PhD 05/24/2016, 3:34 PM [facility] [phone] or if no answer [phone]

## 2016-05-24 NOTE — Progress Notes (Signed)
Physical Therapy Treatment/ Re-eval Patient Details Name: [name] MRN: [id number] DOB: 16-Sep-1945 Today's Date: 05/24/2016    History of Present Illness 71 year old male with past medical history of prostate cancer, tobacco use and COPD admitted for one week of worsening dyspnea and admitted for acute respiratory failure with hypoxia secondary to pneumonia and heart failure.  Pt s/p thoracentesis on 1/2 with 1.5 liter removal.  05/18/15 pleural effusion, 1/8 VATS    PT Comments    Pt pleasant and very willing to mobilize. Pt with sats 88-100% on 4L with activity, HR 64 with activity. Pt educated for activity, encouraged gait with nursing and progression for home. Pt educated for updated plan.   Follow Up Recommendations  No PT follow up     Equipment Recommendations  Rolling walker with 5" wheels    Recommendations for Other Services       Precautions / Restrictions Precautions Precaution Comments: monitor sats, chest tube    Mobility  Bed Mobility Overal bed mobility: Needs Assistance Bed Mobility: Supine to Sit     Supine to sit: Supervision     General bed mobility comments: increased time with rail , supervision for lines  Transfers Overall transfer level: Needs assistance   Transfers: Sit to/from Stand Sit to Stand: Min guard         General transfer comment: cues for hand placement with guarding for lines  Ambulation/Gait Ambulation/Gait assistance: Min guard Ambulation Distance (Feet): 600 Feet Assistive device: Rolling walker (2 wheeled) Gait Pattern/deviations: Step-through pattern;Decreased stride length;Trunk flexed   Gait velocity interpretation: Below normal speed for age/gender General Gait Details: cues for posture, looking up, position in RW and breathing technique with sats 88-100% on 4L   Stairs            Wheelchair Mobility    Modified Rankin (Stroke Patients Only)       Balance Overall balance assessment: No apparent  balance deficits (not formally assessed)                                  Cognition Arousal/Alertness: Awake/alert Behavior During Therapy: WFL for tasks assessed/performed Overall Cognitive Status: Within Functional Limits for tasks assessed                      Exercises      General Comments        Pertinent Vitals/Pain Pain Assessment: 0-10 Pain Score: 3  Pain Location: CT site Pain Descriptors / Indicators: Sore Pain Intervention(s): Limited activity within patient's tolerance;Monitored during session;Repositioned    Home Living                      Prior Function            PT Goals (current goals can now be found in the care plan section) Acute Rehab PT Goals PT Goal Formulation: With patient Time For Goal Achievement: 06/07/16 Potential to Achieve Goals: Good Progress towards PT goals: Progressing toward goals (goals remain appropriate with date updated)    Frequency    Min 3X/week      PT Plan Current plan remains appropriate;Frequency needs to be updated;Equipment recommendations need to be updated    Co-evaluation             End of Session Equipment Utilized During Treatment: Oxygen Activity Tolerance: Patient tolerated treatment well Patient left: in chair;with call bell/phone  within reach     Time: 0804-0835 PT Time Calculation (min) (ACUTE ONLY): 31 min  Charges:  $Gait Training: 8-22 mins                    G Codes:      Citlali Gautney B Rashawn Rolon Jun 15, 2016, 10:44 AM [name], [place]

## 2016-05-24 NOTE — Care Management Note (Addendum)
Case Management Note  Patient Details  Name: [name] MRN: [id number] Date of Birth: Sep 21, 1945  Subjective/Objective:  S/p Right VATS for effusion/ empyema                   Action/Plan:  PTA independent from home with wife.  CM will continue to follow for discharge needs   Expected Discharge Date:                  Expected Discharge Plan:  [place]  In-House Referral:  NA  Discharge planning Services  CM Consult  Post Acute Care Choice:    Choice offered to:  NA  DME Arranged:    DME Agency:     HH Arranged:    HH Agency:     Status of Service:  In process, will continue to follow  If discussed at Long Length of Stay Meetings, dates discussed:    Additional Comments: 05/24/2016   CM provided pt Health [facility] Phone number to provide assistance with locating [facility] PCP, also informed pt that he could call number on the back of his insurance card and request assistance.  Pt still has chest tube and he is having periodic episodes of desats - CM will assess for home oxygen closer to discharge  05/23/16 Pt alert and oriented on nasal cannula.  Pt states he was completely independent PTA.  Pt educated on the importance of both daily weights and low sodium diet.  CM ordered post VATS PT eval.  CM will continue to follow for discharge needs  05/21/16 Pt BP dropped overnight - required fluids and pressors.  Pt will need home oxygen at discharge - Pt remains on pressors with high output Chest tubes.  CM will continue to follow for discharge needs and arrange HH/DME prior to discharge. Maryclare Labrador, RN 05/24/2016, 3:22 PM

## 2016-05-24 NOTE — Progress Notes (Signed)
Patient ID: [name], male   DOB: 10/01/1945, 71 y.o.   MRN: [id number]  SICU Evening Rounds:  Hemodynamically stable off vasopressin.  Walked 3 laps  CT output still too much to remove  Starting to eat some.

## 2016-05-25 ENCOUNTER — Inpatient Hospital Stay (HOSPITAL_COMMUNITY): Payer: Medicare Other

## 2016-05-25 DIAGNOSIS — J449 Chronic obstructive pulmonary disease, unspecified: Secondary | ICD-10-CM

## 2016-05-25 LAB — COMPREHENSIVE METABOLIC PANEL
ALT: 28 U/L (ref 17–63)
AST: 34 U/L (ref 15–41)
Albumin: 2.2 g/dL — ABNORMAL LOW (ref 3.5–5.0)
Alkaline Phosphatase: 195 U/L — ABNORMAL HIGH (ref 38–126)
Anion gap: 9 (ref 5–15)
BUN: 39 mg/dL — ABNORMAL HIGH (ref 6–20)
CO2: 28 mmol/L (ref 22–32)
Calcium: 8.5 mg/dL — ABNORMAL LOW (ref 8.9–10.3)
Chloride: 100 mmol/L — ABNORMAL LOW (ref 101–111)
Creatinine, Ser: 1.24 mg/dL (ref 0.61–1.24)
GFR calc Af Amer: >60 mL/min (ref >60)
GFR calc non Af Amer: 56 mL/min — ABNORMAL LOW (ref >60)
Glucose, Bld: 94 mg/dL (ref 65–99)
Potassium: 4.1 mmol/L (ref 3.5–5.1)
Sodium: 137 mmol/L (ref 135–145)
Total Bilirubin: 0.8 mg/dL (ref 0.3–1.2)
Total Protein: 5.4 g/dL — ABNORMAL LOW (ref 6.5–8.1)

## 2016-05-25 LAB — PROTIME-INR
INR: 1.19
PROTHROMBIN TIME: 15.2 s (ref 11.4–15.2)

## 2016-05-25 LAB — AEROBIC/ANAEROBIC CULTURE W GRAM STAIN (SURGICAL/DEEP WOUND): Culture: NO GROWTH

## 2016-05-25 LAB — CBC
HEMATOCRIT: 32.7 % — AB (ref 39.0–52.0)
HEMOGLOBIN: 10.8 g/dL — AB (ref 13.0–17.0)
MCH: 30.3 pg (ref 26.0–34.0)
MCHC: 33 g/dL (ref 30.0–36.0)
MCV: 91.9 fL (ref 78.0–100.0)
Platelets: 384 10*3/uL (ref 150–400)
RBC: 3.56 MIL/uL — ABNORMAL LOW (ref 4.22–5.81)
RDW: 14.9 % (ref 11.5–15.5)
WBC: 12.5 10*3/uL — AB (ref 4.0–10.5)

## 2016-05-25 NOTE — Progress Notes (Signed)
5 Days Post-Op Procedure(s) (LRB): VIDEO ASSISTED THORACOSCOPY (VATS) with drainage of pleural effusion (Right) VIDEO BRONCHOSCOPY WITH ENDOBRONCHIAL ULTRASOUND (Right) Subjective:  No complaints. Ate good breakfast that family brought for him. Ambulated two laps this am.   Objective: Vital signs in last 24 hours: Temp:  [97.4 F (36.3 C)-98 F (36.7 C)] 97.7 F (36.5 C) (01/13 0714) Pulse Rate:  [45-133] 86 (01/13 0900) Cardiac Rhythm: Atrial flutter (01/13 0800) Resp:  [10-22] 19 (01/13 0900) BP: (98-123)/(54-80) 110/71 (01/13 0900) SpO2:  [66 %-100 %] 94 % (01/13 0900) Weight:  [82.6 kg (182 lb 1.6 oz)] 82.6 kg (182 lb 1.6 oz) (01/13 0600)  Hemodynamic parameters for last 24 hours:    Intake/Output from previous day: 01/12 0701 - 01/13 0700 In: 695 [I.V.:345; IV Piggyback:350] Out: 1895 [Urine:1295; Chest Tube:600] Intake/Output this shift: Total I/O In: 20 [I.V.:20] Out: 305 [Urine:125; Chest Tube:180]  General appearance: alert and cooperative Heart: regular rate and rhythm, S1, S2 normal, no murmur, click, rub or gallop Lungs: clear to auscultation bilaterally Wound: healing well chest tube output still significant but thin serosanguinous  Lab Results:  Recent Labs  05/24/16 0420 05/25/16 0500  WBC 14.2* 12.5*  HGB 9.9* 10.8*  HCT 30.2* 32.7*  PLT 337 384   BMET:  Recent Labs  05/24/16 0420 05/25/16 0500  NA 134* 137  K 4.1 4.1  CL 99* 100*  CO2 27 28  GLUCOSE 153* 94  BUN 32* 39*  CREATININE 1.13 1.24  CALCIUM 8.3* 8.5*    PT/INR:  Recent Labs  05/25/16 0500  LABPROT 15.2  INR 1.19   ABG    Component Value Date/Time   PHART 7.467 (H) 05/22/2016 0354   HCO3 29.7 (H) 05/22/2016 0354   TCO2 31 05/22/2016 0354   O2SAT 93.0 05/22/2016 0354   CBG (last 3)  No results for input(s): GLUCAP in the last 72 hours.  CXR: stable  Assessment/Plan: S/P Procedure(s) (LRB): VIDEO ASSISTED THORACOSCOPY (VATS) with drainage of pleural  effusion (Right) VIDEO BRONCHOSCOPY WITH ENDOBRONCHIAL ULTRASOUND (Right)  He is hemodynamically stable. Encourage nutrition, IS and ambulation Chest tube output still too high to remove. Continue to suction. Coumadin started but INR not bumped yet.   LOS: 11 days    [name] 05/25/2016

## 2016-05-25 NOTE — Progress Notes (Signed)
Follow up - Critical Care Medicine Note  Patient Details:    [name] is an 70 y.o. male w/ h/o COPD and prostate CA. We saw initially in consult at [facility] for large subcarinal mass, CAP (NOS) and eventually what was identified as complicated pleural effusion/empyema for which we referred to thoracic surgery    Subjective:    Overnight Issues:  Doing well , off O2 w/ good sats on room air.  Weaned off pressors. Up in halls walking .   Objective:  Vital signs for last 24 hours: Temp:  [97.4 F (36.3 C)-98 F (36.7 C)] 97.7 F (36.5 C) (01/13 1135) Pulse Rate:  [45-133] 60 (01/13 1200) Resp:  [10-20] 17 (01/13 1200) BP: (99-123)/(54-80) 116/76 (01/13 1200) SpO2:  [66 %-100 %] 97 % (01/13 1200) Weight:  [82.6 kg (182 lb 1.6 oz)] 82.6 kg (182 lb 1.6 oz) (01/13 0600) 4 liters  Hemodynamic parameters for last 24 hours:    Intake/Output Summary (Last 24 hours) at 05/25/16 1350 Last data filed at 05/25/16 1200  Gross per 24 hour  Intake              380 ml  Output             2030 ml  Net            -1650 ml     Vent settings for last 24 hours:  Physical Exam:  GEN: A/Ox3; pleasant , NAD , watching TV    HEENT:  [place]/AT,  NECK:  Supple w/ no adenopathy   RESP decreased BS in bases , no wheezing or accessory use  CT on right   CARD:  RRR, no m/r/g  , no peripheral edema, pulses intact, no cyanosis or clubbing.  GI:   Soft & nt; nml bowel sounds; no organomegaly or masses detected.   Musco: Warm bil, no deformities or joint swelling noted.   Neuro: alert, no focal deficits noted.    Skin: Warm, no lesions or rashes   CBC Recent Labs     05/23/16  0350  05/24/16  0420  05/25/16  0500  WBC  15.9*  14.2*  12.5*  HGB  10.6*  9.9*  10.8*  HCT  32.5*  30.2*  32.7*  PLT  359  337  384    Coag's Recent Labs     05/24/16  0420  05/25/16  0500  INR  1.21  1.19    BMET Recent Labs     05/23/16  0350  05/24/16  0420  05/25/16  0500  NA  134*  134*   137  K  4.3  4.1  4.1  CL  100*  99*  100*  CO2  _0 BUN  29*  32*  39*  CREATININE  1.14  1.13  1.24  GLUCOSE  119*  153*  94    Electrolytes Recent Labs     05/23/16  0350  05/24/16  0420  05/25/16  0500  CALCIUM  8.3*  8.3*  8.5*    Sepsis Markers No results for input(s): PROCALCITON, O2SATVEN in the last 72 hours.  Invalid input(s): LACTICACIDVEN  ABG No results for input(s): PHART, PCO2ART, PO2ART in the last 72 hours.  Liver Enzymes Recent Labs     05/23/16  0350  05/24/16  0420  05/25/16  0500  AST  26  24  34  ALT  _1 ALKPHOS  176*  157*  195*  BILITOT  0.9  0.7  0.8  ALBUMIN  2.3*  2.4*  2.2*    Cardiac Enzymes No results for input(s): TROPONINI, PROBNP in the last 72 hours.  Glucose No results for input(s): GLUCAP in the last 72 hours.  Imaging Dg Chest Port 1 View  Result Date: 05/25/2016 CLINICAL DATA:  Chest tube in place EXAM: PORTABLE CHEST 1 VIEW COMPARISON:  May 24, 2016 FINDINGS: No pneumothorax. Pleural thickening in the right apex is unchanged. A right chest tube remains in place. Small effusions are stable. The cardiomediastinal silhouette is unchanged. Diffuse bilateral pulmonary opacities with more focal opacity in the left base, similar in the interval. There has been mild improvement in the right base but there is new rounded opacity over the lateral right mid lung. IMPRESSION: 1. Stable support apparatus. 2. New rounded opacity over the right lateral mid lung. Recommend attention on follow-up. 3. Mild improvement in the right basilar opacity with stable focal opacity in the left base. Diffuse interstitial opacities otherwise are unchanged. Electronically Signed   By: Dorise Bullion III M.D   On: 05/25/2016 07:53   Dg Chest Port 1 View  Result Date: 05/24/2016 CLINICAL DATA:  Shortness of breath today.  Right chest tube. EXAM: PORTABLE CHEST 1 VIEW COMPARISON:  05/23/2016 and previous FINDINGS: Right internal jugular  central line tip is in the SVC above the right atrium. Right chest tube is in the inferior pleural space as seen yesterday. No pneumothorax. Enlarged cardiac silhouette again demonstrated. Slowly diminishing diffuse pulmonary density consistent with improving edema and/or pneumonia. No worsening or new finding. IMPRESSION: Slow radiographic improvement of diffuse lung density. No pneumothorax with right chest tube unchanged at the inferior pleural space. Electronically Signed   By: Nelson Chimes M.D.   On: 05/24/2016 07:30  no PTX. Right CT still in place. Diffuse bilateral airspace disease c/w residual ALI  Significant Events  1/1 > admit. 1/2 > cards consult for A.fib > started on heparin 01/03. 1/2 > Thoracentesis 1580m removed > exudative / parapneumonic. 1/5 >  right thora with 1215m> thick septations noted [place] referred for VATS. 1/8 > [facility], he was taken to the OR for VATS and bx of subcarinal mass > hypotension > not fluid responsive  > dopamine  01/09 > PCCM re-consulted.   Assessment/Plan:  Acute hypoxic respiratory failure in setting of CAP (NOS) c/b complicated right pleural effusion/empyema w/ acute lung injury superimposed on probable COPD.  Now day #5 post-op VATS.  Improving , weaned off O2   Plan Now vanc and zosyn day # 5/x; off Vanc 1/12 .  Cont Zosyn complete 8-10d zosyn for empyema (already had 7d good CAP coverage and no orgs identified)  chest tube: per CVTS Cont IS add flutter Cont nebs Cont mobilization   Subcarinal mass  s/p EBUS 1/8 c/w granuloma  brushings negative for malignant cells All pleural samples negative for malignancy  Plan Cont serial imaging   Hypotension in setting of Relative adrenal insufficiency, volume depletion and sepsis.  >weaned off pressors .  Plan Cont stress dose steroids and florinef   Accept SBP >90 and MAP >55  New onset AF Acute diastolic HF Plan Cont coumadin Cont tele   Mild anemia  of Critical illness Plan Trend CBC Cont LMWH and coumadin    Summary  Weaned off pressors, up walking in hallways, Chest tube remains on sxn w/ no airleak.  Output remains high, no removal yet per CTS .    Marland KitchenTammy Helmi Hechavarria NP-C  [facility]  [phone]   05/25/2016, 1:50 PM

## 2016-05-26 ENCOUNTER — Inpatient Hospital Stay (HOSPITAL_COMMUNITY): Payer: Medicare Other

## 2016-05-26 LAB — CBC
HCT: 34.2 % — ABNORMAL LOW (ref 39.0–52.0)
Hemoglobin: 11.4 g/dL — ABNORMAL LOW (ref 13.0–17.0)
MCH: 30.2 pg (ref 26.0–34.0)
MCHC: 33.3 g/dL (ref 30.0–36.0)
MCV: 90.7 fL (ref 78.0–100.0)
PLATELETS: 419 10*3/uL — AB (ref 150–400)
RBC: 3.77 MIL/uL — AB (ref 4.22–5.81)
RDW: 14.8 % (ref 11.5–15.5)
WBC: 15.4 10*3/uL — AB (ref 4.0–10.5)

## 2016-05-26 LAB — COMPREHENSIVE METABOLIC PANEL
ALT: 34 U/L (ref 17–63)
AST: 34 U/L (ref 15–41)
Albumin: 2.2 g/dL — ABNORMAL LOW (ref 3.5–5.0)
Alkaline Phosphatase: 190 U/L — ABNORMAL HIGH (ref 38–126)
Anion gap: 8 (ref 5–15)
BUN: 41 mg/dL — ABNORMAL HIGH (ref 6–20)
CO2: 28 mmol/L (ref 22–32)
Calcium: 8.5 mg/dL — ABNORMAL LOW (ref 8.9–10.3)
Chloride: 101 mmol/L (ref 101–111)
Creatinine, Ser: 1.07 mg/dL (ref 0.61–1.24)
GFR calc Af Amer: >60 mL/min (ref >60)
GFR calc non Af Amer: >60 mL/min (ref >60)
Glucose, Bld: 108 mg/dL — ABNORMAL HIGH (ref 65–99)
Potassium: 4 mmol/L (ref 3.5–5.1)
Sodium: 137 mmol/L (ref 135–145)
Total Bilirubin: 0.6 mg/dL (ref 0.3–1.2)
Total Protein: 5.2 g/dL — ABNORMAL LOW (ref 6.5–8.1)

## 2016-05-26 LAB — PROTIME-INR
INR: 1.35
PROTHROMBIN TIME: 16.8 s — AB (ref 11.4–15.2)

## 2016-05-26 MED ORDER — FUROSEMIDE 10 MG/ML IJ SOLN
40.0000 mg | Freq: Once | INTRAMUSCULAR | Status: AC
  Administered 2016-05-26: 40 mg via INTRAVENOUS
  Filled 2016-05-26: qty 4

## 2016-05-26 MED ORDER — POTASSIUM CHLORIDE CRYS ER 20 MEQ PO TBCR
40.0000 meq | EXTENDED_RELEASE_TABLET | Freq: Once | ORAL | Status: AC
  Administered 2016-05-26: 40 meq via ORAL
  Filled 2016-05-26: qty 2

## 2016-05-26 NOTE — Progress Notes (Signed)
Follow up - Critical Care Medicine Note  Patient Details:    [name] is an 71 y.o. male w/ h/o COPD and prostate CA. We saw initially in consult at [facility] for large subcarinal mass, CAP (NOS) and eventually what was identified as complicated pleural effusion/empyema for which we referred to thoracic surgery    Subjective:    Overnight Issues:  Doing well, afebrile , O2 sats good overnight  CT remains to sxn , output decreased overnight .  Objective:  Vital signs for last 24 hours: Temp:  [97.6 F (36.4 C)-98 F (36.7 C)] 98 F (36.7 C) (01/14 0703) Pulse Rate:  [60-79] 65 (01/14 0900) Resp:  [10-24] 19 (01/14 0900) BP: (98-123)/(53-90) 114/70 (01/14 0900) SpO2:  [90 %-100 %] 100 % (01/14 0900) Weight:  [82.6 kg (182 lb 3.2 oz)] 82.6 kg (182 lb 3.2 oz) (01/14 0500) 4 liters  Hemodynamic parameters for last 24 hours:    Intake/Output Summary (Last 24 hours) at 05/26/16 0924 Last data filed at 05/26/16 0900  Gross per 24 hour  Intake              320 ml  Output             1222 ml  Net             -902 ml     Vent settings for last 24 hours:  Physical Exam:  GEN: A/Ox3; pleasant , NAD , watching TV    HEENT:  [place]/AT,  NECK:  Supple w/ no adenopathy   RESP decreased BS in bases , no wheezing or accessory use  CT on right   CARD:  RRR, no m/r/g  , tr-1+peripheral edema, pulses intact, no cyanosis or clubbing.  GI:   Soft & nt; nml bowel sounds; no organomegaly or masses detected.   Musco: Warm bil, no deformities or joint swelling noted.   Neuro: alert, no focal deficits noted.    Skin: Warm, no lesions or rashes   CBC Recent Labs     05/24/16  0420  05/25/16  0500  05/26/16  0500  WBC  14.2*  12.5*  15.4*  HGB  9.9*  10.8*  11.4*  HCT  30.2*  32.7*  34.2*  PLT  337  384  419*    Coag's Recent Labs     05/24/16  0420  05/25/16  0500  05/26/16  0500  INR  1.21  1.19  1.35    BMET Recent Labs     05/24/16  0420  05/25/16  0500   05/26/16  0500  NA  134*  137  137  K  4.1  4.1  4.0  CL  99*  100*  101  CO2  _0 BUN  32*  39*  41*  CREATININE  1.13  1.24  1.07  GLUCOSE  153*  94  108*    Electrolytes Recent Labs     05/24/16  0420  05/25/16  0500  05/26/16  0500  CALCIUM  8.3*  8.5*  8.5*    Sepsis Markers No results for input(s): PROCALCITON, O2SATVEN in the last 72 hours.  Invalid input(s): LACTICACIDVEN  ABG No results for input(s): PHART, PCO2ART, PO2ART in the last 72 hours.  Liver Enzymes Recent Labs     05/24/16  0420  05/25/16  0500  05/26/16  0500  AST  24  34  34  ALT  21  28  34  ALKPHOS  157*  195*  190*  BILITOT  0.7  0.8  0.6  ALBUMIN  2.4*  2.2*  2.2*    Cardiac Enzymes No results for input(s): TROPONINI, PROBNP in the last 72 hours.  Glucose No results for input(s): GLUCAP in the last 72 hours.  Imaging Dg Chest Port 1 View  Result Date: 05/26/2016 CLINICAL DATA:  Empyema.  Shortness breath. EXAM: PORTABLE CHEST 1 VIEW COMPARISON:  Yesterday. FINDINGS: Right jugular catheter tip in the superior vena cava. Stable enlarged cardiac silhouette and bilateral interstitial prominence. The rounded opacity seen on the right previously is no longer demonstrated. A small calcified granuloma in the right upper lung zone is stable. Dense left lower lobe airspace opacity is unchanged. Small bilateral pleural effusions are again demonstrated. IMPRESSION: Stable cardiomegaly, dense left lower lobe atelectasis or pneumonia, small bilateral pleural effusions and interstitial lung disease, including probable pulmonary edema. Electronically Signed   By: Claudie Revering M.D.   On: 05/26/2016 07:20   Dg Chest Port 1 View  Result Date: 05/25/2016 CLINICAL DATA:  Chest tube in place EXAM: PORTABLE CHEST 1 VIEW COMPARISON:  May 24, 2016 FINDINGS: No pneumothorax. Pleural thickening in the right apex is unchanged. A right chest tube remains in place. Small effusions are stable. The  cardiomediastinal silhouette is unchanged. Diffuse bilateral pulmonary opacities with more focal opacity in the left base, similar in the interval. There has been mild improvement in the right base but there is new rounded opacity over the lateral right mid lung. IMPRESSION: 1. Stable support apparatus. 2. New rounded opacity over the right lateral mid lung. Recommend attention on follow-up. 3. Mild improvement in the right basilar opacity with stable focal opacity in the left base. Diffuse interstitial opacities otherwise are unchanged. Electronically Signed   By: Dorise Bullion III M.D   On: 05/25/2016 07:53    Significant Events  1/1 > admit. 1/2 > cards consult for A.fib > started on heparin 01/03. 1/2 > Thoracentesis 1567m removed > exudative / parapneumonic. 1/5 >  right thora with 1283m> thick septations noted on [place] referred for VATS. 1/8 > [facility], he was taken to the OR for VATS and bx of subcarinal mass > hypotension > not fluid responsive  > dopamine  01/09 > PCCM re-consulted.   Assessment/Plan:  Acute hypoxic respiratory failure in setting of CAP (NOS) c/b complicated right pleural effusion/empyema w/ acute lung injury superimposed on probable COPD.  Now day #6 post-op VATS.    Plan Now vanc and zosyn day # 6/x; off Vanc 1/12 .  Cont Zosyn complete 8-10d zosyn for empyema (already had 7d good CAP coverage and no orgs identified)  chest tube: per CVTS Cont IS/ flutter Cont nebs Cont mobilization   Subcarinal mass  s/p EBUS 1/8 c/w granuloma  brushings negative for malignant cells All pleural samples negative for malignancy  Plan Cont serial imaging   Hypotension in setting of Relative adrenal insufficiency, volume depletion and sepsis.  >weaned off pressors 1/12, b/p improving  Plan Consider d/c stress dose steroids in am .  Cont Florinef ?length   Accept SBP >90 and MAP >55  New onset AF Acute diastolic HF-improved (echo 1/2-ef  65%, LA mod/sev dilat, RV mod dilat, RA mod/sev dilat) 1/14 INR 1.35, remains neg bal , diuresis on hold  Plan Cont coumadin/lovenox bridge per pharm  Cont tele   Mild anemia of Critical illness Plan Trend CBC Cont  LMWH and coumadin    Summary  Weaned off pressors, up walking in hallways 1/13 , Chest tube remains on sxn w/ no airleak.     Lynelle Smoke Alliah Boulanger NP-C  [facility]  [phone]   05/26/2016, 9:24 AM

## 2016-05-26 NOTE — Progress Notes (Signed)
Pharmacy Antibiotic Note  [name] is a 71 y.o. male continues on day #6 zosyn for right sided empyema s/p VATS 1/8. Renal function stable. Noted plan per CCM to complete 8-10 days of zosyn.   Plan: 1) Continue zosyn 3.375g IV q8 (4 hour infusion) 2) Follow up LOT  Height: _0  (180.3 cm) Weight: 182 lb 3.2 oz (82.6 kg) IBW/kg (Calculated) : 75.3  Temp (24hrs), Avg:97.8 F (36.6 C), Min:97.6 F (36.4 C), Max:98 F (36.7 C)   Recent Labs Lab 05/21/16 1300 05/22/16 0345 05/23/16 0350 05/24/16 0030 05/24/16 0420 05/25/16 0500 05/26/16 0500  WBC  --  16.0* 15.9*  --  14.2* 12.5* 15.4*  CREATININE  --  1.17 1.14  --  1.13 1.24 1.07  LATICACIDVEN 0.7  --   --   --   --   --   --   VANCOTROUGH  --   --   --  24*  --   --   --     Estimated Creatinine Clearance: 66.5 mL/min (by C-G formula based on SCr of 1.07 mg/dL).    Allergies  Allergen Reactions  . Demerol [Meperidine] Other (See Comments)    UNSPECIFIED REACTION  Causes system to shutdown. ?     Antimicrobials this admission: Cefepime x1 ED Vancomycin 1/8 >> 1/12 Rocephin 1/2 >> 1/8 Zmax 1/2 >> 1/8 Zosyn 1/9 >>  Dose adjustments this admission: n/a  Microbiology results: 1/2 BCx: ngF 1/2 Strep pneumo: neg 1/2 Pleural fluid: ngF 1/8 Body fluid cx (pleural): ngF 1/8 Wound Cx (R lower lung): ngF 1/8 Tissue Cx (pleural): ngF 1/8 Fungus cx: pending  Thank you for allowing pharmacy to be a part of this patient's care.  Deboraha Sprang 05/26/2016 10:55 AM

## 2016-05-26 NOTE — Progress Notes (Signed)
6 Days Post-Op Procedure(s) (LRB): VIDEO ASSISTED THORACOSCOPY (VATS) with drainage of pleural effusion (Right) VIDEO BRONCHOSCOPY WITH ENDOBRONCHIAL ULTRASOUND (Right) Subjective:  No complaints  Ambulating well  Objective: Vital signs in last 24 hours: Temp:  [97.6 F (36.4 C)-98 F (36.7 C)] 98 F (36.7 C) (01/14 0703) Pulse Rate:  [60-79] 74 (01/14 1000) Cardiac Rhythm: Normal sinus rhythm (01/14 0400) Resp:  [10-24] 17 (01/14 1000) BP: (98-123)/(53-90) 117/76 (01/14 1000) SpO2:  [90 %-100 %] 96 % (01/14 1000) Weight:  [82.6 kg (182 lb 3.2 oz)] 82.6 kg (182 lb 3.2 oz) (01/14 0500)  Hemodynamic parameters for last 24 hours:    Intake/Output from previous day: 01/13 0701 - 01/14 0700 In: 320 [I.V.:170; IV Piggyback:150] Out: 1527 [Urine:915; [place]; Chest Tube:610] Intake/Output this shift: Total I/O In: 30 [I.V.:30] Out: 50 [Chest Tube:50]  General appearance: alert and cooperative Heart: regular rate and rhythm, S1, S2 normal, no murmur, click, rub or gallop Lungs: diminished breath sounds bibasilar Extremities: edema mild Chest tube output serosanguinous  Lab Results:  Recent Labs  05/25/16 0500 05/26/16 0500  WBC 12.5* 15.4*  HGB 10.8* 11.4*  HCT 32.7* 34.2*  PLT 384 419*   BMET:  Recent Labs  05/25/16 0500 05/26/16 0500  NA 137 137  K 4.1 4.0  CL 100* 101  CO2 28 28  GLUCOSE 94 108*  BUN 39* 41*  CREATININE 1.24 1.07  CALCIUM 8.5* 8.5*    PT/INR:  Recent Labs  05/26/16 0500  LABPROT 16.8*  INR 1.35   ABG    Component Value Date/Time   PHART 7.467 (H) 05/22/2016 0354   HCO3 29.7 (H) 05/22/2016 0354   TCO2 31 05/22/2016 0354   O2SAT 93.0 05/22/2016 0354   CBG (last 3)  No results for input(s): GLUCAP in the last 72 hours.  CLINICAL DATA:  Empyema.  Shortness breath.  EXAM: PORTABLE CHEST 1 VIEW  COMPARISON:  Yesterday.  FINDINGS: Right jugular catheter tip in the superior vena cava. Stable enlarged cardiac silhouette  and bilateral interstitial prominence. The rounded opacity seen on the right previously is no longer demonstrated. A small calcified granuloma in the right upper lung zone is stable. Dense left lower lobe airspace opacity is unchanged. Small bilateral pleural effusions are again demonstrated.  IMPRESSION: Stable cardiomegaly, dense left lower lobe atelectasis or pneumonia, small bilateral pleural effusions and interstitial lung disease, including probable pulmonary edema.   Electronically Signed   By: Claudie Revering M.D.   On: 05/26/2016 07:20   Assessment/Plan: S/P Procedure(s) (LRB): VIDEO ASSISTED THORACOSCOPY (VATS) with drainage of pleural effusion (Right) VIDEO BRONCHOSCOPY WITH ENDOBRONCHIAL ULTRASOUND (Right)  Chest tube output was 610 cc yesterday so tube needs to stay. Cultures negative.  He has lower extremity edema and weight is about 10 lbs over baseline so will give some lasix today. He probably needs a daily dose for a while. It is difficult to get pleural drainage to stop with excess fluid and low albumin of 2.2.  Encourage nutrition.  General care per CCM   LOS: 12 days    Gaye Pollack 05/26/2016

## 2016-05-27 ENCOUNTER — Inpatient Hospital Stay (HOSPITAL_COMMUNITY): Payer: Medicare Other

## 2016-05-27 DIAGNOSIS — I480 Paroxysmal atrial fibrillation: Secondary | ICD-10-CM

## 2016-05-27 DIAGNOSIS — R918 Other nonspecific abnormal finding of lung field: Secondary | ICD-10-CM

## 2016-05-27 DIAGNOSIS — Z720 Tobacco use: Secondary | ICD-10-CM

## 2016-05-27 LAB — BASIC METABOLIC PANEL
Anion gap: 7 (ref 5–15)
BUN: 39 mg/dL — AB (ref 6–20)
CALCIUM: 8.3 mg/dL — AB (ref 8.9–10.3)
CO2: 29 mmol/L (ref 22–32)
CREATININE: 1.14 mg/dL (ref 0.61–1.24)
Chloride: 102 mmol/L (ref 101–111)
GFR calc non Af Amer: >60 mL/min (ref >60)
GLUCOSE: 88 mg/dL (ref 65–99)
Potassium: 3.7 mmol/L (ref 3.5–5.1)
Sodium: 138 mmol/L (ref 135–145)

## 2016-05-27 LAB — PROTIME-INR
INR: 1.6
PROTHROMBIN TIME: 19.2 s — AB (ref 11.4–15.2)

## 2016-05-27 LAB — CBC
HCT: 32.5 % — ABNORMAL LOW (ref 39.0–52.0)
Hemoglobin: 10.7 g/dL — ABNORMAL LOW (ref 13.0–17.0)
MCH: 30 pg (ref 26.0–34.0)
MCHC: 32.9 g/dL (ref 30.0–36.0)
MCV: 91 fL (ref 78.0–100.0)
PLATELETS: 415 10*3/uL — AB (ref 150–400)
RBC: 3.57 MIL/uL — AB (ref 4.22–5.81)
RDW: 14.9 % (ref 11.5–15.5)
WBC: 13.8 10*3/uL — ABNORMAL HIGH (ref 4.0–10.5)

## 2016-05-27 MED ORDER — PRO-STAT SUGAR FREE PO LIQD
30.0000 mL | Freq: Two times a day (BID) | ORAL | Status: DC
  Administered 2016-05-27 – 2016-05-29 (×4): 30 mL via ORAL
  Filled 2016-05-27 (×7): qty 30

## 2016-05-27 MED ORDER — FUROSEMIDE 10 MG/ML IJ SOLN
20.0000 mg | Freq: Once | INTRAMUSCULAR | Status: AC
  Administered 2016-05-27: 20 mg via INTRAVENOUS
  Filled 2016-05-27: qty 2

## 2016-05-27 NOTE — Progress Notes (Addendum)
Name: [name] MRN: [id number] DOB: July 05, 1945    ADMISSION DATE:  05/13/2016 CONSULTATION DATE:  05/14/2016  REFERRING MD :  Bonnielee Haff, M.D. / [facility]  CHIEF COMPLAINT:  Dyspnea  BRIEF PATIENT DESCRIPTION: 71 y.o. male w/ h/o COPD and prostate CA. We saw initially in consult at [facility] for large subcarinal mass, CAP (NOS) and eventually what was identified as complicated pleural effusion/empyema for which we referred to thoracic surgery   SIGNIFICANT EVENTS  1/01 > admit. 1/02 > cards consult for A.fib > started on heparin 01/03. 1/02 > Thoracentesis 1534m removed > exudative / parapneumonic. 1/05 >right thora with 1265m>thick septations noted on [place]ferred for VATS. 1/08 >[facility] was taken to the OR for VATS and bx of subcarinal mass >hypotension >not fluid responsive >dopamine  1/09 > PCCM re-consulted.  STUDIES:  TTE (05/14/16): Mild LVH with EF 65-70% and normal wall motion. LA & RA moderately to severely dilated. RV moderately dilated with preserved systolic function. No aortic stenosis or regurgitation. Mildly dilated aortic root. Trivial mitral regurgitation without stenosis. D-shaped intraventricular septum suggesting RV pressure/volume overload. No pulmonic regurgitation. Mild tricuspid regurgitation. Trivial pericardial effusion. Right PFA (05/14/16):  LDH 1044 / TP <3.0 / Glucose 33 / Albumin <1.0 / WBC 7920 (21% lymph, 60% neutro, 19% mono) / Ctx Negative / Cytology Negative Right PFA (05/17/16):  LDH 970 / TP <3.0 / Ctx Negative / Cytology Negative  MICROBIOLOGY Blood Ctx x2 1/2:  Negative  MRSA PCR 1/4:  Negative  Wound/Right Lower Lung Ctx 05/20/16:  Negative  Right Pleural Fluid 05/20/16:  Bacteria Negative / AFB pending / Fungus pending Right Pleural Tissue Ctx 05/20/16:  Bacteria Negative / AFB pending / Fungus pending  PATHOLOGY Right Pleural Peel 05/20/16:  C/W Emphysema. No malignancy. Bronchial Wash & Brush RLL 05/20/16:  No  malignancy  EBUS FNA Level 7 05/20/16:  No malignant cells. Granuloma formation.  ANTIBIOTICS Azithromycin 1/2 - 1/8 Rocephin 1/2 - 1/8 Cefepime 1/2 Vancomycin 1/2 - 1/12 Zosyn 1/8 >>  SUBJECTIVE: Patient is ambulating with ease. Denies any chest pain or pressure. Denies any dyspnea with minimal cough.  REVIEW OF SYSTEMS: No abdominal pain, nausea, or vomiting. No subjective fever or chills. No headache or vision changes.  VITAL SIGNS: Temp:  [97.6 F (36.4 C)-98.1 F (36.7 C)] 98 F (36.7 C) (01/15 0710) Pulse Rate:  [63-91] 82 (01/15 0800) Resp:  [13-23] 20 (01/15 0800) BP: (107-130)/(65-79) 128/75 (01/15 0800) SpO2:  [86 %-100 %] 99 % (01/15 0901)  PHYSICAL EXAMINATION: General:  Awake. Alert. No acute distress. Watching TV. Wife at bedside.  Integument:  Warm & dry. No rash on exposed skin. Right chest tube in place. Extremities:  No cyanosis or clubbing.  HEENT:  Moist mucus membranes. No oral ulcers. No scleral injection or icterus.  Cardiovascular:  Regular rate. Trace edema. No appreciable JVD given body positioning.  Pulmonary:  Slight crackles on the right. Normal work of breathing on room air. Abdomen: Soft. Normal bowel sounds. Nondistended. Grossly nontender. Musculoskeletal:  Normal bulk and tone. No joint deformity or effusion appreciated.+   Recent Labs Lab 05/25/16 0500 05/26/16 0500 05/27/16 0330  NA 137 137 138  K 4.1 4.0 3.7  CL 100* 101 102  CO2 _0 BUN 39* 41* 39*  CREATININE 1.24 1.07 1.14  GLUCOSE 94 108* 88    Recent Labs Lab 05/25/16 0500 05/26/16 0500 05/27/16 0330  HGB 10.8* 11.4* 10.7*  HCT 32.7* 34.2* 32.5*  WBC 12.5* 15.4* 13.8*  PLT 384 419* 415*   Dg Chest Port 1 View  Result Date: 05/27/2016 CLINICAL DATA:  71 year old male with history of pneumonia and shortness of breath. Chest tube. EXAM: PORTABLE CHEST 1 VIEW COMPARISON:  Chest x-ray 05/26/2016. FINDINGS: There is a right-sided internal jugular central venous  catheter with tip terminating in the distal superior vena cava. Right-sided chest tube in position with tip in the medial aspect of the lower right hemithorax. No appreciable pneumothorax. Lung volumes are normal. Mild emphysematous changes. Patchy multifocal interstitial and airspace disease throughout the lungs bilaterally (left greater than right). Small bilateral pleural effusions (right greater than left). Heart size is borderline enlarged. The patient is rotated to the left on today's exam, resulting in distortion of the mediastinal contours and reduced diagnostic sensitivity and specificity for mediastinal pathology. Aortic atherosclerosis. IMPRESSION: 1. Support apparatus, as above. 2. Patchy multifocal interstitial and airspace disease bilaterally is favored to reflect pulmonary edema, however, multifocal bronchopneumonia could have a similar appearance. 3. Emphysema. 4. Aortic atherosclerosis. Electronically Signed   By: Vinnie Langton M.D.   On: 05/27/2016 08:06   Dg Chest Port 1 View  Result Date: 05/26/2016 CLINICAL DATA:  Empyema.  Shortness breath. EXAM: PORTABLE CHEST 1 VIEW COMPARISON:  Yesterday. FINDINGS: Right jugular catheter tip in the superior vena cava. Stable enlarged cardiac silhouette and bilateral interstitial prominence. The rounded opacity seen on the right previously is no longer demonstrated. A small calcified granuloma in the right upper lung zone is stable. Dense left lower lobe airspace opacity is unchanged. Small bilateral pleural effusions are again demonstrated. IMPRESSION: Stable cardiomegaly, dense left lower lobe atelectasis or pneumonia, small bilateral pleural effusions and interstitial lung disease, including probable pulmonary edema. Electronically Signed   By: Claudie Revering M.D.   On: 05/26/2016 07:20    ASSESSMENT / PLAN:  71 y.o. male with acute hypoxic respiratory failure secondary to severe community acquired pneumonia and empyema now status post VATS with  biopsy and mediastinal lymph node sampling with endobronchial ultrasound on 1/8. Cultures have remained negative on my review. Patient continues to have chest tube in place with decreasing output. Cardiothoracic surgery is continuing to follow. Currently atrial fibrillation is rate controlled. Patient previously did have hypotension which has subsequently resolved. Patient's respiratory failure has resolved. Continuing broad-spectrum antibiotics for now.  1. Acute Hypoxic Respiratory Failure:  Resolved. Continuing pulmonary toilette with IS q4hr while awake. Atrovent nebs q6hr 2. Severe Community Acquired Pneumonia:  Day #8 of 78 Zosyn empiric antibiotics. Awaiting finalization of AFB and Fungal Cultures.  3. Right Empyema s/p VATS:  Day #7 post-op. Appreciate care by CVTS. Chest tube management per CVTS. Awaiting finalization of cultures.  4. Subcarinal Mass:  Plan for repeat imaging after discharge.  5. Hypotension:  Likely due to adrenal insufficiency vs hypovolemia vs sepsis. Resolved. Off pressors. 6. Possible Adrenal Insufficiency:  Solu-Cortef started 1/10. Discontinuing steroids today. 7. New Onset Atrial Fibrillation:  Continuous telemetry monitoring. Off Amiodarone. Systemic anticoagulation with Coumadin per pharmacy protocol. Daily INR. 8. Anemia:  No signs of active bleeding. Hgb stable. Trending cell counts daily w/ CBC. 9. Prophylaxis:  Coumadin for systemic anticoagulation, SCDs, & Protonix.  10. Diet:  Heart Healthy Diet.  11. Tobacco Use Disorder:  Continuing NIcotine patch 3m/24 hr. 12. Disposition:  Awaiting transfer to stepdown unit bed.   I have spent a total of 46 minutes today reviewing the patient's electronic medical record and caring for the  patient over 50% of which has been spent in face-to-face contact with the patient as well as updating his family at bedside.  Sonia Baller Ashok Cordia, M.D. [facility] Pager:  [phone] After 3pm or if no  response, call [phone] 05/27/2016, 10:21 AM

## 2016-05-27 NOTE — Progress Notes (Signed)
Nutrition Consult/Follow Up   DOCUMENTATION CODES:   Severe malnutrition in context of chronic illness  INTERVENTION:    Prostat liquid protein po 30 ml BID with meals, each supplement provides 100 kcal, 15 grams protein   Continue Ensure Enlive po TID, each supplement provides 350 kcal and 20 grams of protein  NUTRITION DIAGNOSIS:   Malnutrition related to chronic illness as evidenced by severe depletion of body fat, severe depletion of muscle mass; ongoing  GOAL:   Patient will meet greater than or equal to 90% of their needs; progressing  MONITOR:   PO intake, Supplement acceptance, Labs, Weight trends, I & O's  ASSESSMENT:    71 yo male patient with past medical history of COPD, Smoking, prostate cancer, Pneumonia 1 year ago and inguinal hernia S/P repair who presented with a 2 week history of progressive shortness of breath on 05/13/2016.   Pt s/p procedures 1/8: VIDEO ASSISTED THORACOSCOPY (VATS) with drainage of pleural effusion (Right) VIDEO BRONCHOSCOPY WITH ENDOBRONCHIAL ULTRASOUND (Right)  Pt reports he doesn't like the food here at the hospital. Has been getting food brought in from outside. He is consuming his Ensure Enlive supplements. Weight has been stable since admission. RD to order Prosat liquid protein.  Diet Order:  Diet Heart Room service appropriate? Yes; Fluid consistency: Thin  Skin:   (Incision on back and chest)  Last BM:  1/13  Height:   Ht Readings from Last 1 Encounters:  05/22/16 _0  (1.803 m)    Weight:   Wt Readings from Last 1 Encounters:  05/26/16 182 lb 3.2 oz (82.6 kg)    Ideal Body Weight:  78.2 kg  BMI:  Body mass index is 25.41 kg/m.  Estimated Nutritional Needs:   Kcal:  2150-2350  Protein:  105-120 grams  Fluid:  Per MD  EDUCATION NEEDS:   No education needs identified at this time  Arthur Holms, RD, LDN Pager #: [phone] After-Hours Pager #: [phone]

## 2016-05-27 NOTE — Significant Event (Signed)
Patient transferred safely to 3E02, taken via wheelchair. VS stable prior and during the transfer. All belongings take to new room (cellphone, charger, candies). Patient notified his spouse. Report given to receiving RN Shammy. Staff in room prior to RN leaving.        [name]

## 2016-05-27 NOTE — Plan of Care (Signed)
Problem: Activity: Goal: Risk for activity intolerance will decrease Outcome: Completed/Met Date Met: 05/27/16 Pt demonstrates ability to ambulate frequently during a day with minimal assistance, OOB to chair for meals without difficulty.   Problem: Respiratory: Goal: Respiratory status will improve Outcome: Progressing Pt demonstrates adequate oxygenation on room air, performs pulmonary toilet exercises independently without s/s of increased WOB.  Problem: Pain Management: Goal: Pain level will decrease Outcome: Completed/Met Date Met: 05/27/16 Pt requiring minimal PRN analgesia supplement for complete pain relief.

## 2016-05-27 NOTE — Progress Notes (Signed)
7 Days Post-Op Procedure(s) (LRB): VIDEO ASSISTED THORACOSCOPY (VATS) with drainage of pleural effusion (Right) VIDEO BRONCHOSCOPY WITH ENDOBRONCHIAL ULTRASOUND (Right) Subjective: waqiting for stepdown bed CT still draining 300 cc serous fluid Some ankle edema nutrition consult to increase protein intake, dietary preferences Objective: Vital signs in last 24 hours: Temp:  [97.6 F (36.4 C)-98.1 F (36.7 C)] 98 F (36.7 C) (01/15 0710) Pulse Rate:  [63-91] 68 (01/15 0700) Cardiac Rhythm: Atrial flutter (01/14 2000) Resp:  [13-23] 13 (01/15 0700) BP: (107-130)/(65-79) 107/73 (01/15 0400) SpO2:  [86 %-100 %] 95 % (01/15 0700)  Hemodynamic parameters for last 24 hours:  stable a-fib, INR increasing and can stop lovenox  Intake/Output from previous day: 01/14 0701 - 01/15 0700 In: 210 [I.V.:110; IV Piggyback:100] Out: 1885 [Urine:1525; Chest Tube:360] Intake/Output this shift: No intake/output data recorded.       Exam    General- alert and comfortable   Lungs- clear without rales, wheezes   Cor- irregular rate and rhythm, no murmur , gallop   Abdomen- soft, non-tender   Extremities - warm, non-tender, minimal edema   Neuro- oriented, appropriate, no focal weakness   Lab Results:  Recent Labs  05/26/16 0500 05/27/16 0330  WBC 15.4* 13.8*  HGB 11.4* 10.7*  HCT 34.2* 32.5*  PLT 419* 415*   BMET:  Recent Labs  05/26/16 0500 05/27/16 0330  NA 137 138  K 4.0 3.7  CL 101 102  CO2 28 29  GLUCOSE 108* 88  BUN 41* 39*  CREATININE 1.07 1.14  CALCIUM 8.5* 8.3*    PT/INR:  Recent Labs  05/27/16 0330  LABPROT 19.2*  INR 1.60   ABG    Component Value Date/Time   PHART 7.467 (H) 05/22/2016 0354   HCO3 29.7 (H) 05/22/2016 0354   TCO2 31 05/22/2016 0354   O2SAT 93.0 05/22/2016 0354   CBG (last 3)  No results for input(s): GLUCAP in the last 72 hours.  Assessment/Plan: S/P Procedure(s) (LRB): VIDEO ASSISTED THORACOSCOPY (VATS) with drainage of pleural  effusion (Right) VIDEO BRONCHOSCOPY WITH ENDOBRONCHIAL ULTRASOUND (Right) Cont iv Zosyn while chest tube in place Leave tube until drainage < 100 cc per 24 Hrs   LOS: 13 days    Tharon Aquas Trigt III 05/27/2016

## 2016-05-28 ENCOUNTER — Inpatient Hospital Stay (HOSPITAL_COMMUNITY): Payer: Medicare Other

## 2016-05-28 LAB — CBC WITH DIFFERENTIAL/PLATELET
Basophils Absolute: 0 10*3/uL (ref 0.0–0.1)
Basophils Relative: 0 %
Eosinophils Absolute: 0.2 10*3/uL (ref 0.0–0.7)
Eosinophils Relative: 1 %
HEMATOCRIT: 31.7 % — AB (ref 39.0–52.0)
Hemoglobin: 10.5 g/dL — ABNORMAL LOW (ref 13.0–17.0)
LYMPHS ABS: 2.7 10*3/uL (ref 0.7–4.0)
LYMPHS PCT: 21 %
MCH: 30.3 pg (ref 26.0–34.0)
MCHC: 33.1 g/dL (ref 30.0–36.0)
MCV: 91.4 fL (ref 78.0–100.0)
MONO ABS: 1.6 10*3/uL — AB (ref 0.1–1.0)
MONOS PCT: 12 %
NEUTROS ABS: 8.9 10*3/uL — AB (ref 1.7–7.7)
Neutrophils Relative %: 66 %
Platelets: 438 10*3/uL — ABNORMAL HIGH (ref 150–400)
RBC: 3.47 MIL/uL — ABNORMAL LOW (ref 4.22–5.81)
RDW: 15.3 % (ref 11.5–15.5)
WBC: 13.4 10*3/uL — ABNORMAL HIGH (ref 4.0–10.5)

## 2016-05-28 LAB — RENAL FUNCTION PANEL
ANION GAP: 6 (ref 5–15)
Albumin: 1.9 g/dL — ABNORMAL LOW (ref 3.5–5.0)
BUN: 39 mg/dL — ABNORMAL HIGH (ref 6–20)
CALCIUM: 8 mg/dL — AB (ref 8.9–10.3)
CO2: 30 mmol/L (ref 22–32)
Chloride: 103 mmol/L (ref 101–111)
Creatinine, Ser: 1.03 mg/dL (ref 0.61–1.24)
GFR calc non Af Amer: >60 mL/min (ref >60)
GLUCOSE: 115 mg/dL — AB (ref 65–99)
PHOSPHORUS: 2.7 mg/dL (ref 2.5–4.6)
Potassium: 3.9 mmol/L (ref 3.5–5.1)
Sodium: 139 mmol/L (ref 135–145)

## 2016-05-28 LAB — PROTIME-INR
INR: 1.7
Prothrombin Time: 20.2 seconds — ABNORMAL HIGH (ref 11.4–15.2)

## 2016-05-28 LAB — MAGNESIUM: Magnesium: 1.8 mg/dL (ref 1.7–2.4)

## 2016-05-28 MED ORDER — AMOXICILLIN-POT CLAVULANATE 875-125 MG PO TABS
1.0000 | ORAL_TABLET | Freq: Two times a day (BID) | ORAL | Status: DC
  Administered 2016-05-28 – 2016-05-31 (×6): 1 via ORAL
  Filled 2016-05-28 (×6): qty 1

## 2016-05-28 MED ORDER — NICOTINE 7 MG/24HR TD PT24
7.0000 mg | MEDICATED_PATCH | Freq: Every day | TRANSDERMAL | Status: DC
  Administered 2016-05-28 – 2016-05-31 (×4): 7 mg via TRANSDERMAL
  Filled 2016-05-28 (×4): qty 1

## 2016-05-28 MED ORDER — IPRATROPIUM BROMIDE 0.02 % IN SOLN: 0.5000 mg | Freq: Four times a day (QID) | RESPIRATORY_TRACT | Status: DC | PRN

## 2016-05-28 NOTE — Care Management Note (Signed)
Case Management Note  Patient Details  Name: TARIN NAVAREZ MRN: [id number] Date of Birth: 26-Aug-1945  Subjective/Objective:    S/p VATS , drainage of pl effusion, per pt eval no f/u needed, but recs rolling walker, NCM went in to speak with patient about walker and he was sleeping , will check back with patient later today. NCM spoke with patient , informed him that pt eval is rec rolling walker for him, he states he does not think he need a rolling walker and does not want one.  NCM will cont to follow for dc needs.                  Action/Plan:   Expected Discharge Date:                  Expected Discharge Plan:  Home/Self Care  In-House Referral:  NA  Discharge planning Services  CM Consult  Post Acute Care Choice:    Choice offered to:  NA  DME Arranged:    DME Agency:     HH Arranged:    HH Agency:     Status of Service:  In process, will continue to follow  If discussed at Long Length of Stay Meetings, dates discussed:    Additional Comments:  Zenon Mayo, RN 05/28/2016, 1:50 PM

## 2016-05-28 NOTE — Progress Notes (Addendum)
[address]       [place] [address]             [phone]      8 Days Post-Op Procedure(s) (LRB): VIDEO ASSISTED THORACOSCOPY (VATS) with drainage of pleural effusion (Right) VIDEO BRONCHOSCOPY WITH ENDOBRONCHIAL ULTRASOUND (Right) Subjective: Shares that he cannot eat the food here. He would like to walk more.   Objective: Vital signs in last 24 hours: Temp:  [98.5 F (36.9 C)-99 F (37.2 C)] 98.5 F (36.9 C) (01/16 0255) Pulse Rate:  [62-86] 62 (01/16 0700) Cardiac Rhythm: Atrial flutter (01/16 0751) Resp:  [12-20] 14 (01/16 0700) BP: (105-117)/(67-73) 105/67 (01/16 0255) SpO2:  [91 %-99 %] 91 % (01/16 0700)     Intake/Output from previous day: 01/15 0701 - 01/16 0700 In: 1069.5 [P.O.:957; IV Piggyback:112.5] Out: 2270 [Urine:1900; Chest Tube:370] Intake/Output this shift: No intake/output data recorded.  General appearance: alert, cooperative and no distress Heart: regular rate and rhythm, S1, S2 normal, no murmur, click, rub or gallop Lungs: clear to auscultation bilaterally Abdomen: soft, non-tender; bowel sounds normal; no masses,  no organomegaly Extremities: extremities normal, atraumatic, no cyanosis or edema Wound: CLEAN AND DRY  Lab Results:  Recent Labs  05/27/16 0330 05/28/16 0247  WBC 13.8* 13.4*  HGB 10.7* 10.5*  HCT 32.5* 31.7*  PLT 415* 438*   BMET:  Recent Labs  05/27/16 0330 05/28/16 0247  NA 138 139  K 3.7 3.9  CL 102 103  CO2 29 30  GLUCOSE 88 115*  BUN 39* 39*  CREATININE 1.14 1.03  CALCIUM 8.3* 8.0*    PT/INR:  Recent Labs  05/28/16 0247  LABPROT 20.2*  INR 1.70   ABG    Component Value Date/Time   PHART 7.467 (H) 05/22/2016 0354   HCO3 29.7 (H) 05/22/2016 0354   TCO2 31 05/22/2016 0354   O2SAT 93.0 05/22/2016 0354   CBG (last 3)  No results for input(s): GLUCAP in the last 72 hours.  Assessment/Plan: S/P Procedure(s) (LRB): VIDEO ASSISTED THORACOSCOPY (VATS) with drainage of  pleural effusion (Right) VIDEO BRONCHOSCOPY WITH ENDOBRONCHIAL ULTRASOUND (Right)  HR in RRR 70s Tolerating room air. Continue chest tube. Output 353m in 24 hours. CXR stable. Encourage patient to bring in heart healthy food from home to encourage caloric intake Ambulate TID. PT/OT consult to increase mobilization    LOS: 14 days    TElgie Collard1/16/2018  he needs to remain in hospital until chest tube drainage decreases and hope to remove tube DC Zosyn and cover with augmentin 1 week   patient examined and medical record reviewed,agree with above note. PTharon AquasTrigt III 05/28/2016

## 2016-05-28 NOTE — Progress Notes (Signed)
Physical Therapy Treatment Patient Details Name: [name] MRN: [id number] DOB: Oct 01, 1945 Today's Date: 06-15-16    History of Present Illness 71 year old male with past medical history of prostate cancer, tobacco use and COPD admitted for one week of worsening dyspnea and admitted for acute respiratory failure with hypoxia secondary to pneumonia and heart failure.  Pt s/p thoracentesis on 1/2 with 1.5 liter removal.  05/18/15 pleural effusion, 1/8 VATS    PT Comments    Pt doing well with mobility. Only requiring supervision to amb 750'.   Follow Up Recommendations  No PT follow up     Equipment Recommendations  Rolling walker with 5" wheels (possibly)    Recommendations for Other Services       Precautions / Restrictions Precautions Precaution Comments: monitor sats, chest tube Restrictions Weight Bearing Restrictions: No    Mobility  Bed Mobility                  Transfers Overall transfer level: Needs assistance Equipment used: Rolling walker (2 wheeled) Transfers: Sit to/from Stand Sit to Stand: Supervision         General transfer comment: cues for hand placement  Ambulation/Gait Ambulation/Gait assistance: Supervision Ambulation Distance (Feet): 750 Feet Assistive device: Rolling walker (2 wheeled) Gait Pattern/deviations: Step-through pattern;Decreased stride length;Trunk flexed   Gait velocity interpretation: at or above normal speed for age/gender General Gait Details: Cues to stand more erect. SpO2 99% at end of amb   Stairs            Wheelchair Mobility    Modified Rankin (Stroke Patients Only)       Balance Overall balance assessment: No apparent balance deficits (not formally assessed)                                  Cognition Arousal/Alertness: Awake/alert Behavior During Therapy: WFL for tasks assessed/performed Overall Cognitive Status: Within Functional Limits for tasks assessed                      Exercises      General Comments        Pertinent Vitals/Pain      Home Living                      Prior Function            PT Goals (current goals can now be found in the care plan section) Progress towards PT goals: Progressing toward goals    Frequency    Min 3X/week      PT Plan Current plan remains appropriate;Frequency needs to be updated;Equipment recommendations need to be updated    Co-evaluation             End of Session   Activity Tolerance: Patient tolerated treatment well Patient left: in chair;with call bell/phone within reach;with family/visitor present     Time: 3329-5188 PT Time Calculation (min) (ACUTE ONLY): 22 min  Charges:  $Gait Training: 8-22 mins                    G CodesShary Decamp [facility] 2016/06/15, 10:36 AM Suanne Marker PT [phone]

## 2016-05-28 NOTE — Care Management Important Message (Signed)
Important Message  Patient Details  Name: [name] MRN: [id number] Date of Birth: 03/01/1946   Medicare Important Message Given:  Yes    Nathen May 05/28/2016, 12:38 PM

## 2016-05-28 NOTE — Progress Notes (Signed)
Name: [name] MRN: [id number] DOB: [birth year]    ADMISSION DATE:  05/13/2016 CONSULTATION DATE:  05/14/2016  REFERRING MD :  Bonnielee Haff, M.D. / [facility]  CHIEF COMPLAINT:  Dyspnea  BRIEF PATIENT DESCRIPTION: 71 y.o. male w/ h/o COPD and prostate CA. We saw initially in consult at [facility] for large subcarinal mass, CAP (NOS) and eventually what was identified as complicated pleural effusion/empyema for which we referred to thoracic surgery with subsequent VATS.  SIGNIFICANT EVENTS  1/01 > admit. 1/02 > cards consult for A.fib > started on heparin 01/03. 1/02 > Thoracentesis 1565m removed > exudative / parapneumonic. 1/05 >right thora with 1260m>thick septations noted on US[place]ore[place]ferred for VATS. 1/08 >[facility] was taken to the OR for VATS and bx of subcarinal mass >hypotension >not fluid responsive >dopamine  1/09 > PCCM re-consulted. 1/15 transferred to SDU  STUDIES:  TTE (05/14/16): Mild LVH with EF 65-70% and normal wall motion. LA & RA moderately to severely dilated. RV moderately dilated with preserved systolic function. No aortic stenosis or regurgitation. Mildly dilated aortic root. Trivial mitral regurgitation without stenosis. D-shaped intraventricular septum suggesting RV pressure/volume overload. No pulmonic regurgitation. Mild tricuspid regurgitation. Trivial pericardial effusion. Right PFA (05/14/16):  LDH 1044 / TP <3.0 / Glucose 33 / Albumin <1.0 / WBC 7920 (21% lymph, 60% neutro, 19% mono) / Ctx Negative / Cytology Negative Right PFA (05/17/16):  LDH 970 / TP <3.0 / Ctx Negative / Cytology Negative  MICROBIOLOGY Blood Ctx x2 1/2:  Negative  MRSA PCR 1/4:  Negative  Wound/Right Lower Lung Ctx 05/20/16:  Negative  Right Pleural Fluid 05/20/16:  Bacteria Negative / AFB pending / Fungus pending Right Pleural Tissue Ctx 05/20/16:  Bacteria Negative / AFB pending / Fungus pending  PATHOLOGY Right Pleural Peel 05/20/16:  C/W Emphysema. No  malignancy. Bronchial Wash & Brush RLL 05/20/16:  No malignancy  EBUS FNA Level 7 05/20/16:  No malignant cells. Granuloma formation.  ANTIBIOTICS Azithromycin 1/2 - 1/8 Rocephin 1/2 - 1/8 Cefepime 1/2 Vancomycin 1/2 - 1/12 Zosyn 1/8 >>  SUBJECTIVE: Continues to improve. RN reports he wants to stay in the bed.   VITAL SIGNS: Temp:  [98.5 F (36.9 C)-99 F (37.2 C)] 98.5 F (36.9 C) (01/16 0255) Pulse Rate:  [62-86] 62 (01/16 0700) Resp:  [12-20] 14 (01/16 0700) BP: (105-117)/(67-73) 105/67 (01/16 0255) SpO2:  [91 %-99 %] 91 % (01/16 0700)  PHYSICAL EXAMINATION: General:  Awake. Alert. No acute distress. Watching TV. Complains of bad night Integument:  Warm & dry. No rash on exposed skin. Right chest tube in place. No air leak, >200 cc of serous fluid over 24 hours Extremities:  No cyanosis or clubbing.  HEENT:  Moist mucus membranes. No oral ulcers. No scleral injection or icterus.  Cardiovascular:  Regular rate.AF with cvr. Trace edema. No appreciable JVD given body positioning.  Pulmonary:  Slight crackles on the right. Normal work of breathing on room air. Abdomen: Soft. Normal bowel sounds. Nondistended. Grossly nontender. Musculoskeletal:  Normal bulk and tone. No joint deformity or effusion appreciated.+   Recent Labs Lab 05/26/16 0500 05/27/16 0330 05/28/16 0247  NA 137 138 139  K 4.0 3.7 3.9  CL 101 102 103  CO2 _0 BUN 41* 39* 39*  CREATININE 1.07 1.14 1.03  GLUCOSE 108* 88 115*    Recent Labs Lab 05/26/16 0500 05/27/16 0330 05/28/16 0247  HGB 11.4* 10.7* 10.5*  HCT 34.2* 32.5* 31.7*  WBC 15.4* 13.8* 13.4*  PLT 419* 415* 438*   Dg Chest Port 1 View  Result Date: 05/27/2016 CLINICAL DATA:  71 year old male with history of pneumonia and shortness of breath. Chest tube. EXAM: PORTABLE CHEST 1 VIEW COMPARISON:  Chest x-ray 05/26/2016. FINDINGS: There is a right-sided internal jugular central venous catheter with tip terminating in the distal  superior vena cava. Right-sided chest tube in position with tip in the medial aspect of the lower right hemithorax. No appreciable pneumothorax. Lung volumes are normal. Mild emphysematous changes. Patchy multifocal interstitial and airspace disease throughout the lungs bilaterally (left greater than right). Small bilateral pleural effusions (right greater than left). Heart size is borderline enlarged. The patient is rotated to the left on today's exam, resulting in distortion of the mediastinal contours and reduced diagnostic sensitivity and specificity for mediastinal pathology. Aortic atherosclerosis. IMPRESSION: 1. Support apparatus, as above. 2. Patchy multifocal interstitial and airspace disease bilaterally is favored to reflect pulmonary edema, however, multifocal bronchopneumonia could have a similar appearance. 3. Emphysema. 4. Aortic atherosclerosis. Electronically Signed   By: Vinnie Langton M.D.   On: 05/27/2016 08:06    ASSESSMENT / PLAN:  71 y.o. male with acute hypoxic respiratory failure secondary to severe community acquired pneumonia and empyema now status post VATS with biopsy and mediastinal lymph node sampling with endobronchial ultrasound on 1/8. Cultures have remained negative on  review. Patient continues to have chest tube in place with decreasing output. Cardiothoracic surgery is continuing to follow. Currently atrial fibrillation is rate controlled. Patient previously did have hypotension which has subsequently resolved. Patient's respiratory failure has resolved. Continuing broad-spectrum antibiotics for now. Chest tube remains to 20 cm suction per CVTS  1. Acute Hypoxic Respiratory Failure:  Resolved. Continuing pulmonary toilette with IS q4hr while awake. Atrovent nebs q6hr prn 2. Severe Community Acquired Pneumonia:  Day #9 of 17 Zosyn empiric antibiotics. Awaiting finalization of AFB and Fungal Cultures.  3. Right Empyema s/p VATS:  Day #8 post-op. Appreciate care by CVTS.  Chest tube management per CVTS. Awaiting finalization of cultures. C to remain in till drainage < 100 cc daily. 1/16  drainage 210 cc 4. Subcarinal Mass:  Plan for repeat imaging after discharge.  5. Hypotension:  Likely due to adrenal insufficiency vs hypovolemia vs sepsis. Resolved. Off pressors. 1/16 sbp 117 6. Possible Adrenal Insufficiency:  Solu-Cortef started 1/10. Discontinuing steroids 1/15 7. New Onset Atrial Fibrillation:  Continuous telemetry monitoring. Off Amiodarone. Systemic anticoagulation with Coumadin per pharmacy protocol. Daily INR. 1.70 om 1/16 8. Anemia:  No signs of active bleeding. Hgb stable. Trending cell counts daily w/ CBC. 9. Prophylaxis:  Coumadin for systemic anticoagulation, SCDs, & Protonix.  10. Diet:  Heart Healthy Diet. Increased protein intake 11. Tobacco Use Disorder:  Continuing NIcotine patch 4m/24 hr. 12. Disposition:  Currently in SDU/ Consider back to triad service per MD.   SRichardson LandryMinor ACNP LMaryanna ShapePCCM Pager [phone] 3 pm If no answer page [phone] 89201/16/2018, 8:01 AM   STAFF NOTE: I, DMerrie Roof MD FACP have personally reviewed patient's available data, including medical history, events of note, physical examination and test results as part of my evaluation. I have discussed with resident/NP and other care providers such as pharmacist, RN and RRT. In addition, I personally evaluated patient and elicited key findings of: sleeping. No distress, coarse BS, pcxr with CT remaining and int changes, chest tube output notd over 150, per CVTS, to remain and follow output, BP wnl off stress roids and  florinef, , s/p 9 days broad zosyn, dc and follow clinical course, if declines given path would add empiric steroids, avoiding for now given concerns empyema culture neg, to triad, call if needed  Lavon Paganini. Titus Mould, MD, Yaak Pgr: [facility] 05/28/2016 3:11 PM

## 2016-05-29 ENCOUNTER — Inpatient Hospital Stay (HOSPITAL_COMMUNITY): Payer: Medicare Other

## 2016-05-29 LAB — BASIC METABOLIC PANEL
Anion gap: 7 (ref 5–15)
BUN: 40 mg/dL — ABNORMAL HIGH (ref 6–20)
CALCIUM: 8.1 mg/dL — AB (ref 8.9–10.3)
CO2: 27 mmol/L (ref 22–32)
CREATININE: 0.95 mg/dL (ref 0.61–1.24)
Chloride: 105 mmol/L (ref 101–111)
GFR calc Af Amer: >60 mL/min (ref >60)
GFR calc non Af Amer: >60 mL/min (ref >60)
GLUCOSE: 85 mg/dL (ref 65–99)
Potassium: 4.1 mmol/L (ref 3.5–5.1)
Sodium: 139 mmol/L (ref 135–145)

## 2016-05-29 LAB — MAGNESIUM: Magnesium: 1.8 mg/dL (ref 1.7–2.4)

## 2016-05-29 LAB — RENAL FUNCTION PANEL
ALBUMIN: 2 g/dL — AB (ref 3.5–5.0)
Anion gap: 9 (ref 5–15)
BUN: 41 mg/dL — ABNORMAL HIGH (ref 6–20)
CO2: 26 mmol/L (ref 22–32)
Calcium: 8.1 mg/dL — ABNORMAL LOW (ref 8.9–10.3)
Chloride: 103 mmol/L (ref 101–111)
Creatinine, Ser: 0.98 mg/dL (ref 0.61–1.24)
GFR calc Af Amer: >60 mL/min (ref >60)
GFR calc non Af Amer: >60 mL/min (ref >60)
GLUCOSE: 82 mg/dL (ref 65–99)
PHOSPHORUS: 2.9 mg/dL (ref 2.5–4.6)
POTASSIUM: 4 mmol/L (ref 3.5–5.1)
Sodium: 138 mmol/L (ref 135–145)

## 2016-05-29 LAB — CBC WITH DIFFERENTIAL/PLATELET
BASOS ABS: 0 10*3/uL (ref 0.0–0.1)
Basophils Relative: 0 %
EOS ABS: 0.4 10*3/uL (ref 0.0–0.7)
EOS PCT: 4 %
HCT: 33.8 % — ABNORMAL LOW (ref 39.0–52.0)
Hemoglobin: 11 g/dL — ABNORMAL LOW (ref 13.0–17.0)
LYMPHS ABS: 2.6 10*3/uL (ref 0.7–4.0)
LYMPHS PCT: 22 %
MCH: 30 pg (ref 26.0–34.0)
MCHC: 32.5 g/dL (ref 30.0–36.0)
MCV: 92.1 fL (ref 78.0–100.0)
Monocytes Absolute: 1.6 10*3/uL — ABNORMAL HIGH (ref 0.1–1.0)
Monocytes Relative: 13 %
Neutro Abs: 7.4 10*3/uL (ref 1.7–7.7)
Neutrophils Relative %: 61 %
PLATELETS: 436 10*3/uL — AB (ref 150–400)
RBC: 3.67 MIL/uL — ABNORMAL LOW (ref 4.22–5.81)
RDW: 15.3 % (ref 11.5–15.5)
WBC: 12 10*3/uL — ABNORMAL HIGH (ref 4.0–10.5)

## 2016-05-29 LAB — PROTIME-INR
INR: 2.02
PROTHROMBIN TIME: 23.1 s — AB (ref 11.4–15.2)

## 2016-05-29 MED ORDER — SENNOSIDES-DOCUSATE SODIUM 8.6-50 MG PO TABS: 1.0000 | ORAL_TABLET | Freq: Every evening | ORAL | Status: DC | PRN

## 2016-05-29 MED ORDER — FUROSEMIDE 40 MG PO TABS
40.0000 mg | ORAL_TABLET | Freq: Every day | ORAL | Status: DC
  Administered 2016-05-29: 40 mg via ORAL
  Filled 2016-05-29: qty 1

## 2016-05-29 MED ORDER — MELATONIN 3 MG PO TABS
3.0000 mg | ORAL_TABLET | ORAL | Status: DC
  Administered 2016-05-29 – 2016-05-30 (×2): 3 mg via ORAL
  Filled 2016-05-29 (×3): qty 1

## 2016-05-29 NOTE — Progress Notes (Signed)
PROGRESS NOTE    [name]  TTS:[id number] DOB: 1945-10-14 DOA: 05/13/2016 PCP: [name]  [facility]   Brief Narrative:  71 year old male COPD Prostate cancer followed by urology-Dr. Kathy [facility], TURP ? Coagulopathy Right inguinal hernia repair 2004 Prior history is, cell carcinoma status post nasal [facility] 06[facility]2009  Initially admitted 05/14/2016 with a new diagnosis of atrial fibrillation as well as bilateral pleural effusions--he had been treated 4 days prior to admission with right-sided chest pain given Levaquin for possible pneumonia Chest x-ray showed moderate right and left in patchy airspace opacities WBC 20,000 Sodium 130 BUN/creatinine 40/1.2, usual baseline 0.73 TSH 1.5  CT scan chest showed no pulmonary embolism mediastinal mass with large right-sided effusion plus centrilobular emphysema  Thoracentesis was performed 05/17/16 and because of persisting fluid, CVTS was consulted and the patient underwent VATS and biopsy of subcarinal mass 05/20/2016 but developed hypotension and was started on low-dose dopamine PC CM continue to follow until      Assessment & Plan:   Principal Problem:   Acute respiratory failure with hypoxia (HCC) Active Problems:   Bilateral pleural effusion   COPD (chronic obstructive pulmonary disease) (HCC)   Tobacco abuse   Protein-calorie malnutrition, severe (HCC)   S/P thoracentesis   Lung mass   Loculated pleural effusion   Parapneumonic effusion   Empyema (HCC)   Bilateral lower extremity edema   Atrial fibrillation (HCC)   Septic shock (HCC)  Right empyema s/p VATS 05/20/16-thoracic surgery continues to follow, culture data negative for acid-fast, fluid culture negative, fungal culture no growth to date. Previously on broad-spectrum vancomycin/Zosyn and narrowed to Augmentin 1/16-chest tube drainage being monitored daily   Severe community-acquired pneumonia, acute hypoxic respiratory failure-continue IS  every 4, Atrovent every 6 when necessary, Flonase 1 spray, Mucinex's 1. 2 g twice a day.  Blood pressure is stable  Atrial fibrillation, [place] score 2 on Coumadin Diastolic heart failure, EF 05/14/2016 65-70% Clinically compensated. Not currently on Lasix. No rate controlling agent, previously on amiodarone, as is currently rate controlled and previously was in sepsis CV TS is placed him on Lasix 40  Subcarinal mass-will need repeat imaging post discharge  Severe hypotension-possible hypovolemia, sepsis was on pressors and blood pressures previously were in normal limits  Anemia-no completing current  Heart healthy diet Nicotine patch 7 mg/24     DVT prophylaxis: Anticoagulation Code Status: Full Family Communication: None at the bedside Disposition Plan: Patient pending resolution   SIGNIFICANT EVENTS  1/01 > admit. 1/02 > cards consult for A.fib > started on heparin 01/03. 1/02 > Thoracentesis 1575m removed > exudative / parapneumonic. 1/05 >right thora with 1224m>thick septations noted on US[place]oreareferred for VATS. 1/08 >[facility] he was taken to the OR for VATS and bx of subcarinal mass >hypotension >not fluid responsive >dopamine  1/09 > PCCM re-consulted.  STUDIES:  TTE (05/14/16): Mild LVH with EF 65-70% and normal wall motion. LA & RA moderately to severely dilated. RV moderately dilated with preserved systolic function. No aortic stenosis or regurgitation. Mildly dilated aortic root. Trivial mitral regurgitation without stenosis. D-shaped intraventricular septum suggesting RV pressure/volume overload. No pulmonic regurgitation. Mild tricuspid regurgitation. Trivial pericardial effusion. Right PFA (05/14/16):  LDH 1044 / TP <3.0 / Glucose 33 / Albumin <1.0 / WBC 7920 (21% lymph, 60% neutro, 19% mono) / Ctx Negative / Cytology Negative Right PFA (05/17/16):  LDH 970 / TP <3.0 / Ctx Negative / Cytology Negative  MICROBIOLOGY  Blood Ctx x2 1/2:   Negative  MRSA PCR 1/4:  Negative  Wound/Right Lower Lung Ctx 05/20/16:  Negative  Right Pleural Fluid 05/20/16:  Bacteria Negative / AFB pending / Fungus pending Right Pleural Tissue Ctx 05/20/16:  Bacteria Negative / AFB pending / Fungus pending  PATHOLOGY Right Pleural Peel 05/20/16:  C/W Emphysema. No malignancy. Bronchial Wash & Brush RLL 05/20/16:  No malignancy  EBUS FNA Level 7 05/20/16:  No malignant cells. Granuloma formation.  ANTIBIOTICS Azithromycin 1/2 - 1/8 Rocephin 1/2 - 1/8 Cefepime 1/2 Vancomycin 1/2 - 1/12 Zosyn 1/8 >>1/18 Augmentin 1/16  Subjective: Awake and alert Distress Ambulated No chest pain--ordering diet  Objective: Vitals:   05/28/16 1921 05/28/16 2256 05/29/16 0337 05/29/16 0700  BP: (!) 111/58 116/72 113/65   Pulse: 81 88 67 87  Resp: 12  16   Temp: 97.9 F (36.6 C) 98.7 F (37.1 C) 97.9 F (36.6 C)   TempSrc: Oral Oral Oral   SpO2: 91% 94% 94% 95%  Weight:      Height:        Intake/Output Summary (Last 24 hours) at 05/29/16 0724 Last data filed at 05/29/16 0457  Gross per 24 hour  Intake                0 ml  Output             1141 ml  Net            -1141 ml   Filed Weights   05/22/16 0600 05/25/16 0600 05/26/16 0500  Weight: 82.1 kg (181 lb) 82.6 kg (182 lb 1.6 oz) 82.6 kg (182 lb 3.2 oz)    Examination:  General exam: Appears calm and comfortable  Respiratory system: Clear to auscultation. Slightly decreased air entry bilaterally lower bases especially right >left, trace edema Cardiovascular system: S1 & S2 heard, regularly irregular Gastrointestinal system: Abdomen is nondistended, soft and nontender. No organomegaly or masses felt. Normal bowel sounds heard. Central nervous system: Alert and oriented. No focal neurological deficits. Extremities: Symmetric 5 x 5 power. Skin: No rashes, lesions or ulcers Psychiatry: Judgement and insight appear normal. Mood & affect appropriate.     Data Reviewed: I have personally reviewed  following labs and imaging studies  CBC:  Recent Labs Lab 05/25/16 0500 05/26/16 0500 05/27/16 0330 05/28/16 0247 05/29/16 0337  WBC 12.5* 15.4* 13.8* 13.4* 12.0*  NEUTROABS  --   --   --  8.9* 7.4  HGB 10.8* 11.4* 10.7* 10.5* 11.0*  HCT 32.7* 34.2* 32.5* 31.7* 33.8*  MCV 91.9 90.7 91.0 91.4 92.1  PLT 384 419* 415* 438* 621*   Basic Metabolic Panel:  Recent Labs Lab 05/25/16 0500 05/26/16 0500 05/27/16 0330 05/28/16 0247 05/29/16 0337  NA 137 137 138 139 139  138  K 4.1 4.0 3.7 3.9 4.1  4.0  CL 100* 101 102 103 105  103  CO2 _0 GLUCOSE 94 108* 88 115* 85  82  BUN 39* 41* 39* 39* 40*  41*  CREATININE 1.24 1.07 1.14 1.03 0.95  0.98  CALCIUM 8.5* 8.5* 8.3* 8.0* 8.1*  8.1*  MG  --   --   --  1.8 1.8  PHOS  --   --   --  2.7 2.9   GFR: Estimated Creatinine Clearance: 72.6 mL/min (by C-G formula based on SCr of 0.98 mg/dL). Liver Function Tests:  Recent Labs Lab 05/23/16 0350 05/24/16 0420 05/25/16 0500 05/26/16  0500 05/28/16 0247 05/29/16 0337  AST 26 24 34 34  --   --   ALT _0 34  --   --   ALKPHOS 176* 157* 195* 190*  --   --   BILITOT 0.9 0.7 0.8 0.6  --   --   PROT 5.3* 5.1* 5.4* 5.2*  --   --   ALBUMIN 2.3* 2.4* 2.2* 2.2* 1.9* 2.0*   No results for input(s): LIPASE, AMYLASE in the last 168 hours. No results for input(s): AMMONIA in the last 168 hours. Coagulation Profile:  Recent Labs Lab 05/25/16 0500 05/26/16 0500 05/27/16 0330 05/28/16 0247 05/29/16 0337  INR 1.19 1.35 1.60 1.70 2.02   Cardiac Enzymes: No results for input(s): CKTOTAL, CKMB, CKMBINDEX, TROPONINI in the last 168 hours. BNP (last 3 results) No results for input(s): PROBNP in the last 8760 hours. HbA1C: No results for input(s): HGBA1C in the last 72 hours. CBG: No results for input(s): GLUCAP in the last 168 hours. Lipid Profile: No results for input(s): CHOL, HDL, LDLCALC, TRIG, CHOLHDL, LDLDIRECT in the last 72 hours. Thyroid Function  Tests: No results for input(s): TSH, T4TOTAL, FREET4, T3FREE, THYROIDAB in the last 72 hours. Anemia Panel: No results for input(s): VITAMINB12, FOLATE, FERRITIN, TIBC, IRON, RETICCTPCT in the last 72 hours. Sepsis Labs: No results for input(s): PROCALCITON, LATICACIDVEN in the last 168 hours.  Recent Results (from the past 240 hour(s))  Aerobic Culture (superficial specimen)     Status: None   Collection Time: 05/20/16  8:28 AM  Result Value Ref Range Status   Specimen Description WOUND RIGHT LOWER LUNG  Final   Special Requests SWAB SPEC A RIGHT LOWER LOBE POF ZINACEF  Final   Gram Stain   Final    RARE WBC PRESENT, PREDOMINANTLY PMN NO ORGANISMS SEEN    Culture NO GROWTH 2 DAYS  Final   Report Status 05/22/2016 FINAL  Final  Culture, fungus without smear     Status: None (Preliminary result)   Collection Time: 05/20/16 10:34 AM  Result Value Ref Range Status   Specimen Description FLUID RIGHT PLEURAL  Final   Special Requests SPEC F IN TASP CONTAINER POF ZINACEF  Final   Culture NO FUNGUS ISOLATED AFTER 7 DAYS  Final   Report Status PENDING  Incomplete  Acid Fast Smear (AFB)     Status: None   Collection Time: 05/20/16 10:34 AM  Result Value Ref Range Status   AFB Specimen Processing Concentration  Final   Acid Fast Smear Negative  Final    Comment: (NOTE) Performed At: [facility] [place], [place] [id number] Lindon Romp MD [id number]    Source (AFB) FLUID  Final    Comment: RIGHT PLEURAL   Body fluid culture     Status: None   Collection Time: 05/20/16 10:34 AM  Result Value Ref Range Status   Specimen Description FLUID RIGHT PLEURAL  Final   Special Requests SPEC F IN TASP CONTAINER POF ZINACEF  Final   Gram Stain   Final    FEW WBC PRESENT,BOTH PMN AND MONONUCLEAR NO ORGANISMS SEEN    Culture No growth aerobically or anaerobically.  Final   Report Status 05/23/2016 FINAL  Final  Culture, fungus without smear     Status: None  (Preliminary result)   Collection Time: 05/20/16 10:37 AM  Result Value Ref Range Status   Specimen Description TISSUE RIGHT PLEURAL  Final   Special Requests POF ZINACEF  Final  Culture NO FUNGUS ISOLATED AFTER 7 DAYS  Final   Report Status PENDING  Incomplete  Aerobic/Anaerobic Culture (surgical/deep wound)     Status: None   Collection Time: 05/20/16 10:37 AM  Result Value Ref Range Status   Specimen Description TISSUE RIGHT PLEURAL  Final   Special Requests POF ZINACEF  Final   Gram Stain   Final    MODERATE WBC PRESENT,BOTH PMN AND MONONUCLEAR NO ORGANISMS SEEN    Culture No growth aerobically or anaerobically.  Final   Report Status 05/25/2016 FINAL  Final  Acid Fast Smear (AFB)     Status: None   Collection Time: 05/20/16 10:37 AM  Result Value Ref Range Status   AFB Specimen Processing Comment  Final    Comment: Tissue Grinding and Digestion/Decontamination   Acid Fast Smear Negative  Final    Comment: (NOTE) Performed At: [facility] [place], [place] [id number] Lindon Romp MD [id number]    Source (AFB) TISSUE  Final    Comment: RIGHT PLEURAL          Radiology Studies: Dg Chest Port 1 View  Result Date: 05/28/2016 CLINICAL DATA:  Shortness of breath.  Chest tube present EXAM: PORTABLE CHEST 1 VIEW COMPARISON:  May 27, 2016 FINDINGS: Central catheter tip is in the superior vena cava. There is a chest tube on the right inferiorly with the tip directed medially, stable. No evident pneumothorax. There are bilateral pleural effusions with diffuse interstitial and patchy alveolar opacity, likely due to edema. There is slightly there is mild consolidation in the medial bases bilaterally. There is a small calcified granuloma in the right upper lobe, stable. Heart is enlarged with mild pulmonary venous hypertension. There is atherosclerotic calcification in the aorta. No adenopathy. No bone lesions. IMPRESSION: Tube and catheter positions  without evident pneumothorax. Findings most consistent with congestive heart failure. There may be superimposed pneumonia, particularly in the bases bilaterally. Both pneumonia and congestive heart failure may present concurrently. No new opacity evident. Stable cardiac silhouette. There is aortic atherosclerosis. Small granuloma right upper lobe, stable. Electronically Signed   By: Lowella Grip III M.D.   On: 05/28/2016 08:51        Scheduled Meds: . amoxicillin-clavulanate  1 tablet Oral Q12H  . aspirin  81 mg Oral Daily  . feeding supplement (ENSURE ENLIVE)  237 mL Oral TID BM  . feeding supplement (PRO-STAT SUGAR FREE 64)  30 mL Oral BID  . fluticasone  1 spray Each Nare Daily  . geriatric multivitamins-minerals  15 mL Oral TID WC  . guaiFENesin  1,200 mg Oral BID  . mouth rinse  15 mL Mouth Rinse BID  . nicotine  7 mg Transdermal Daily  . pantoprazole  40 mg Oral Daily  . sodium chloride flush  10-40 mL Intracatheter Q12H  . tamsulosin  0.4 mg Oral QPC breakfast  . warfarin  4 mg Oral q1800  . Warfarin - Physician Dosing Inpatient   Does not apply q1800   Continuous Infusions: . dextrose 5 % and 0.9% NaCl Stopped (05/26/16 1800)     LOS: 15 days    Time spent: [place], [place], MD [facility] Pager [phone]  If 7PM-7AM, please contact night-coverage [email or web address] Password [facility] 05/29/2016, 7:24 AM

## 2016-05-29 NOTE — Progress Notes (Signed)
Report called to [facility] on 2W. Patient is transferring to room 09.

## 2016-05-29 NOTE — Progress Notes (Addendum)
[address]       ,[place] [address]             [phone]      9 Days Post-Op Procedure(s) (LRB): VIDEO ASSISTED THORACOSCOPY (VATS) with drainage of pleural effusion (Right) VIDEO BRONCHOSCOPY WITH ENDOBRONCHIAL ULTRASOUND (Right) Subjective: Unhappy that he has not been walked today. Per nursing walked several times yesterday.   Objective: Vital signs in last 24 hours: Temp:  [97.8 F (36.6 C)-98.7 F (37.1 C)] 98 F (36.7 C) (01/17 0700) Pulse Rate:  [62-88] 87 (01/17 0700) Cardiac Rhythm: Atrial fibrillation (01/17 0722) Resp:  [11-25] 16 (01/17 0337) BP: (96-116)/(58-73) 108/73 (01/17 0700) SpO2:  [86 %-95 %] 95 % (01/17 0700)     Intake/Output from previous day: 01/16 0701 - 01/17 0700 In: -  Out: 1141 [Urine:750; Stool:1; Chest Tube:390] Intake/Output this shift: No intake/output data recorded.  General appearance: alert, cooperative and no distress Heart: regular rate and rhythm, S1, S2 normal, no murmur, click, rub or gallop Lungs: clear to auscultation bilaterally Abdomen: soft, non-tender; bowel sounds normal; no masses,  no organomegaly Extremities: 3+ pitting edema up to below knee Wound: clean and dry  Lab Results:  Recent Labs  05/28/16 0247 05/29/16 0337  WBC 13.4* 12.0*  HGB 10.5* 11.0*  HCT 31.7* 33.8*  PLT 438* 436*   BMET:  Recent Labs  05/28/16 0247 05/29/16 0337  NA 139 139  138  K 3.9 4.1  4.0  CL 103 105  103  CO2 _0 GLUCOSE 115* 85  82  BUN 39* 40*  41*  CREATININE 1.03 0.95  0.98  CALCIUM 8.0* 8.1*  8.1*    PT/INR:  Recent Labs  05/29/16 0337  LABPROT 23.1*  INR 2.02   ABG    Component Value Date/Time   PHART 7.467 (H) 05/22/2016 0354   HCO3 29.7 (H) 05/22/2016 0354   TCO2 31 05/22/2016 0354   O2SAT 93.0 05/22/2016 0354   CBG (last 3)  No results for input(s): GLUCAP in the last 72 hours.  Assessment/Plan: S/P Procedure(s) (LRB): VIDEO ASSISTED THORACOSCOPY  (VATS) with drainage of pleural effusion (Right) VIDEO BRONCHOSCOPY WITH ENDOBRONCHIAL ULTRASOUND (Right)   1. HR in RRR 70s 2. Tolerating room air. Continue chest tube. Output 315m in 24 hours. CXR showed: Right IJ central line removed. Right chest tube remains. No pneumothorax. Less pleural effusion evident at the right lung base but more pleural fluid in the right minor fissure today. Bilateral lower lobe collapse or consolidation. 3. Encourage patient to bring in heart healthy food from home to encourage caloric intake 4. Will order melatonin at night for insomnia 5. Fluid overload with 3-4+ pitting edema. Probably needs daily Lasix. Creatinine down to 0.95. Elevate legs while in chair. TED hose. 6. Ambulate TID. PT/OT consult to increase mobilization 7. Dispo: continue chest tube. Add TED hose and melatonin. Walk TID, PT order placed. Per nursing does not need step down unit care and asking for transfer.    LOS: 15 days    TElgie Collard1/17/2018  cont coumadin for afib Start lasix 40 po daily transfer to 2 wOkeeneto replace pleurovac patient examined and medical record reviewed,agree with above note. PTharon AquasTrigt III 05/29/2016

## 2016-05-30 ENCOUNTER — Inpatient Hospital Stay (HOSPITAL_COMMUNITY): Payer: Medicare Other

## 2016-05-30 LAB — COMPREHENSIVE METABOLIC PANEL
ALT: 31 U/L (ref 17–63)
ANION GAP: 6 (ref 5–15)
AST: 33 U/L (ref 15–41)
Albumin: 1.9 g/dL — ABNORMAL LOW (ref 3.5–5.0)
Alkaline Phosphatase: 177 U/L — ABNORMAL HIGH (ref 38–126)
BUN: 41 mg/dL — ABNORMAL HIGH (ref 6–20)
CHLORIDE: 105 mmol/L (ref 101–111)
CO2: 29 mmol/L (ref 22–32)
Calcium: 8 mg/dL — ABNORMAL LOW (ref 8.9–10.3)
Creatinine, Ser: 1.01 mg/dL (ref 0.61–1.24)
Glucose, Bld: 106 mg/dL — ABNORMAL HIGH (ref 65–99)
POTASSIUM: 4.2 mmol/L (ref 3.5–5.1)
SODIUM: 140 mmol/L (ref 135–145)
Total Bilirubin: 0.7 mg/dL (ref 0.3–1.2)
Total Protein: 4.5 g/dL — ABNORMAL LOW (ref 6.5–8.1)

## 2016-05-30 LAB — RENAL FUNCTION PANEL
ALBUMIN: 1.8 g/dL — AB (ref 3.5–5.0)
Anion gap: 6 (ref 5–15)
BUN: 41 mg/dL — AB (ref 6–20)
CALCIUM: 8 mg/dL — AB (ref 8.9–10.3)
CO2: 29 mmol/L (ref 22–32)
CREATININE: 0.94 mg/dL (ref 0.61–1.24)
Chloride: 104 mmol/L (ref 101–111)
GFR calc Af Amer: >60 mL/min (ref >60)
Glucose, Bld: 103 mg/dL — ABNORMAL HIGH (ref 65–99)
PHOSPHORUS: 2.8 mg/dL (ref 2.5–4.6)
Potassium: 4.1 mmol/L (ref 3.5–5.1)
SODIUM: 139 mmol/L (ref 135–145)

## 2016-05-30 LAB — PROTIME-INR
INR: 1.99
Prothrombin Time: 22.9 seconds — ABNORMAL HIGH (ref 11.4–15.2)

## 2016-05-30 MED ORDER — FUROSEMIDE 40 MG PO TABS
40.0000 mg | ORAL_TABLET | Freq: Every day | ORAL | Status: DC
  Administered 2016-05-30 – 2016-05-31 (×2): 40 mg via ORAL
  Filled 2016-05-30 (×2): qty 1

## 2016-05-30 MED ORDER — METOLAZONE 5 MG PO TABS
5.0000 mg | ORAL_TABLET | Freq: Every day | ORAL | Status: DC
  Administered 2016-05-30 – 2016-05-31 (×2): 5 mg via ORAL
  Filled 2016-05-30 (×2): qty 1

## 2016-05-30 MED ORDER — SENNOSIDES-DOCUSATE SODIUM 8.6-50 MG PO TABS
1.0000 | ORAL_TABLET | Freq: Every day | ORAL | Status: DC
  Filled 2016-05-30: qty 1

## 2016-05-30 MED ORDER — SPIRONOLACTONE 25 MG PO TABS
25.0000 mg | ORAL_TABLET | Freq: Every day | ORAL | Status: DC
  Administered 2016-05-30 – 2016-05-31 (×2): 25 mg via ORAL
  Filled 2016-05-30 (×2): qty 1

## 2016-05-30 MED ORDER — SILDENAFIL CITRATE 20 MG PO TABS
20.0000 mg | ORAL_TABLET | Freq: Two times a day (BID) | ORAL | Status: DC
  Administered 2016-05-30 – 2016-05-31 (×3): 20 mg via ORAL
  Filled 2016-05-30 (×5): qty 1

## 2016-05-30 NOTE — Progress Notes (Signed)
Physical Therapy Treatment Patient Details Name: [name] MRN: [id number] DOB: 1945/10/30 Today's Date: 05/30/2016    History of Present Illness 71 year old male with past medical history of prostate cancer, tobacco use and COPD admitted for one week of worsening dyspnea and admitted for acute respiratory failure with hypoxia secondary to pneumonia and heart failure.  Pt s/p thoracentesis on 1/2 with 1.5 liter removal.  05/18/15 pleural effusion, 1/8 VATS    PT Comments    Pt very pleasant, moving well and able to increase ambulation distance today with RW but denied attempting without AD. Will attempt without RW next session. Pt educated for benefit of mobility, HEP and encouraged to perform walking daily with staff awareness. Will follow acutely to return pt to independent function.   sats 91-93% on RA HR 86-92   Follow Up Recommendations  No PT follow up     Equipment Recommendations       Recommendations for Other Services       Precautions / Restrictions Precautions Precautions: Other (comment) Precaution Comments: monitor sats, chest tube    Mobility  Bed Mobility Overal bed mobility: Modified Independent                Transfers Overall transfer level: Modified independent                  Ambulation/Gait Ambulation/Gait assistance: Supervision Ambulation Distance (Feet): 950 Feet Assistive device: Rolling walker (2 wheeled)     Gait velocity interpretation: Below normal speed for age/gender General Gait Details: Cues to stand more erect. SpO2 93% at end of amb. pt denied ambulation without RW but would attempt next session   Stairs Stairs: Yes   Stair Management: One rail Left;Alternating pattern;Forwards Number of Stairs: 5    Wheelchair Mobility    Modified Rankin (Stroke Patients Only)       Balance                                    Cognition Arousal/Alertness: Awake/alert Behavior During Therapy: WFL  for tasks assessed/performed Overall Cognitive Status: Within Functional Limits for tasks assessed                      Exercises General Exercises - Lower Extremity Long Arc Quad: AROM;Both;Seated;15 reps Hip Flexion/Marching: AROM;Both;Seated;15 reps    General Comments        Pertinent Vitals/Pain Pain Assessment: 0-10 Pain Score: 2  Pain Location: CT site Pain Intervention(s): Limited activity within patient's tolerance;Monitored during session    Home Living                      Prior Function            PT Goals (current goals can now be found in the care plan section) Progress towards PT goals: Progressing toward goals    Frequency           PT Plan Current plan remains appropriate    Co-evaluation             End of Session   Activity Tolerance: Patient tolerated treatment well Patient left: in bed;with call bell/phone within reach     Time: 1013-1039 PT Time Calculation (min) (ACUTE ONLY): 26 min  Charges:  $Gait Training: 23-37 mins  G Codes:      [name] [name] 05/30/2016, 11:14 AM [name], [name]

## 2016-05-30 NOTE — Progress Notes (Signed)
PROGRESS NOTE    [name]  ZOX:[id number] DOB: 06-26-1945 DOA: 05/13/2016 PCP: [name]  [facility]   Brief Narrative:  71 year old male COPD Prostate cancer followed by urology-Dr. [facility], TURP ? Coagulopathy Right inguinal hernia repair 2004 Prior history is, cell carcinoma status post [facility]  Initially admitted 05/14/2016 with a new diagnosis of atrial fibrillation as well as bilateral pleural effusions--he had been treated 4 days prior to admission with right-sided chest pain given Levaquin at [facility] for possible pneumonia Came to the emergency room because of severe lower extremity swelling  Chest x-ray showed moderate right and left in patchy airspace opacities WBC 20,000 Sodium 130 BUN/creatinine 40/1.2, usual baseline 0.73 TSH 1.5  CT scan chest showed no pulmonary embolism mediastinal mass with large right-sided effusion plus centrilobular emphysema  Thoracentesis was performed 05/17/16 and because of persisting fluid, CVTS was consulted and the patient underwent VATS and biopsy of subcarinal mass 05/20/2016 but developed hypotension and was started on low-dose dopamine PCCM continue to follow until      Assessment & Plan:   Principal Problem:   Acute respiratory failure with hypoxia ([place]) Active Problems:   Bilateral pleural effusion   COPD (chronic obstructive pulmonary disease) (HCC)   Tobacco abuse   Protein-calorie malnutrition, severe (HCC)   S/P thoracentesis   Lung mass   Loculated pleural effusion   Parapneumonic effusion   Empyema (HCC)   Bilateral lower extremity edema   Atrial fibrillation (HCC)   Septic shock (HCC)  Right empyema s/p VATS 05/20/16-thoracic surgery continues to follow, culture data negative for acid-fast, fluid culture negative, fungal culture no growth to date. Previously on broad-spectrum vancomycin/Zosyn and narrowed to Augmentin 1/16-chest tube drainage being monitored  daily Mini-express being considered prior to discharge with home health  Severe community-acquired pneumonia Empyema,  acute hypoxic respiratory failure- continue IS every 4, Atrovent every 6 when necessary, Flonase 1 spray, Mucinex's 1. 2 g twice a day.  Blood pressure is stable Transitioned from Zosyn to Augmentin as above--duration be determined by cardiothoracic surgery  Atrial fibrillation, [place] score 2 on Coumadin Diastolic heart failure, EF 05/14/2016 65-70% Pulmonary hypertension Clinically compensated.  No rate controlling agent, previously on amiodarone, as is currently rate controlled and previously was in sepsis CV TS is placed him on Lasix 40, currently on metolazone 5 and Aldactone 25 Started on Revatio 20 twice a day Have asked scheduler for a close one-week follow-up for both heart failure and atrial fibrillation INR is 1.9  Subcarinal mass-will need repeat imaging post discharge Pathology from 1/8 reviewed and no specific mention of subcarinal mass Will need follow-up as above  Severe hypotension-possible hypovolemia, sepsis was on pressors and blood pressures previously were in normal limits  Anemia-no completing current  BPH Continue Flomax 0.4 daily  Heart healthy diet Nicotine patch 7 mg/24  Moderate protein energy malnutrition  Slightly elevated alkaline phosphatase-etiology uncler Has been worked up at [employer]     DVT prophylaxis: Anticoagulation Code Status: Full Family Communication: None at the bedside Disposition Plan: Patient pending resolution   SIGNIFICANT EVENTS  1/01 > admit. 1/02 > cards consult for A.fib > started on heparin 01/03. 1/02 > Thoracentesis 1555m removed > exudative / parapneumonic. 1/05 >right thora with 12101m>thick [place]ations [place]. [facility]08 [facility] he was taken to the OR for VATS and bx of subcarinal mass >hypotension >not fluid responsive >dopamine  1/09 >  PCCM re-consulted.  STUDIES:  TTE (05/14/16): Mild LVH with EF 65-70% and normal wall motion. LA & RA moderately to severely dilated. RV moderately dilated with preserved systolic function. No aortic stenosis or regurgitation. Mildly dilated aortic root. Trivial mitral regurgitation without stenosis. D-shaped intraventricular septum suggesting RV pressure/volume overload. No pulmonic regurgitation. Mild tricuspid regurgitation. Trivial pericardial effusion. Right PFA (05/14/16):  LDH 1044 / TP <3.0 / Glucose 33 / Albumin <1.0 / WBC 7920 (21% lymph, 60% neutro, 19% mono) / Ctx Negative / Cytology Negative Right PFA (05/17/16):  LDH 970 / TP <3.0 / Ctx Negative / Cytology Negative  MICROBIOLOGY Blood Ctx x2 1/2:  Negative  MRSA PCR 1/4:  Negative  Wound/Right Lower Lung Ctx 05/20/16:  Negative  Right Pleural Fluid 05/20/16:  Bacteria Negative / AFB pending / Fungus pending Right Pleural Tissue Ctx 05/20/16:  Bacteria Negative / AFB pending / Fungus pending  PATHOLOGY Right Pleural Peel 05/20/16:  C/W Emphysema. No malignancy. Bronchial Wash & Brush RLL 05/20/16:  No malignancy  EBUS FNA Level 7 05/20/16:  No malignant cells. Granuloma formation.  ANTIBIOTICS Azithromycin 1/2 - 1/8 Rocephin 1/2 - 1/8 Cefepime 1/2 Vancomycin 1/2 - 1/12 Zosyn 1/8 >>1/18 Augmentin 1/16  Subjective: Awake and alert Distress Ambulated No chest pain--ordering diet  Objective: Vitals:   05/29/16 2051 05/30/16 0407 05/30/16 1112 05/30/16 1338  BP: 105/65 (!) 100/57  (!) 104/59  Pulse: 75 82 86 77  Resp: _0 Temp: 98 F (36.7 C) 98 F (36.7 C)    TempSrc: Oral Oral    SpO2: 97% 94% 91% 94%  Weight:      Height:        Intake/Output Summary (Last 24 hours) at 05/30/16 1545 Last data filed at 05/30/16 1300  Gross per 24 hour  Intake             1080 ml  Output             2100 ml  Net            -1020 ml   Filed Weights   05/22/16 0600 05/25/16 0600 05/26/16 0500  Weight: 82.1 kg (181 lb) 82.6 kg  (182 lb 1.6 oz) 82.6 kg (182 lb 3.2 oz)    Examination:  General exam: Appears calm and comfortable-Ambulated well today  Respiratory system: Clear to auscultation biLaterally Cardiovascular system: S1 & S2 heard, regularly irregular Gastrointestinal system: Abdomen is nondistended, soft and nontender.  Central nervous system: Alert and oriented. No focal neurological deficits. Extremities: Symmetric 5 x 5 power. Skin: No rashes, lesions or ulcers Psychiatry: Judgement and insight appear normal. Mood & affect appropriate.     Data Reviewed: I have personally reviewed following labs and imaging studies  CBC:  Recent Labs Lab 05/25/16 0500 05/26/16 0500 05/27/16 0330 05/28/16 0247 05/29/16 0337  WBC 12.5* 15.4* 13.8* 13.4* 12.0*  NEUTROABS  --   --   --  8.9* 7.4  HGB 10.8* 11.4* 10.7* 10.5* 11.0*  HCT 32.7* 34.2* 32.5* 31.7* 33.8*  MCV 91.9 90.7 91.0 91.4 92.1  PLT 384 419* 415* 438* 700*   Basic Metabolic Panel:  Recent Labs Lab 05/26/16 0500 05/27/16 0330 05/28/16 0247 05/29/16 0337 05/30/16 0346  NA 137 138 139 139  138 140  139  K 4.0 3.7 3.9 4.1  4.0 4.2  4.1  CL 101 102 103 105  103 105  104  CO2 _1 29  GLUCOSE 108* 88 115* 85  82 106*  103*  BUN 41* 39* 39* 40*  41* 41*  41*  CREATININE 1.07 1.14 1.03 0.95  0.98 1.01  0.94  CALCIUM 8.5* 8.3* 8.0* 8.1*  8.1* 8.0*  8.0*  MG  --   --  1.8 1.8  --   PHOS  --   --  2.7 2.9 2.8   GFR: Estimated Creatinine Clearance: 75.7 mL/min (by C-G formula based on SCr of 0.94 mg/dL). Liver Function Tests:  Recent Labs Lab 05/24/16 0420 05/25/16 0500 05/26/16 0500 05/28/16 0247 05/29/16 0337 05/30/16 0346  AST 24 34 34  --   --  33  ALT 21 28 34  --   --  31  ALKPHOS 157* 195* 190*  --   --  177*  BILITOT 0.7 0.8 0.6  --   --  0.7  PROT 5.1* 5.4* 5.2*  --   --  4.5*  ALBUMIN 2.4* 2.2* 2.2* 1.9* 2.0* 1.9*  1.8*   No results for input(s): LIPASE, AMYLASE in the last 168  hours. No results for input(s): AMMONIA in the last 168 hours. Coagulation Profile:  Recent Labs Lab 05/26/16 0500 05/27/16 0330 05/28/16 0247 05/29/16 0337 05/30/16 0346  INR 1.35 1.60 1.70 2.02 1.99   Cardiac Enzymes: No results for input(s): CKTOTAL, CKMB, CKMBINDEX, TROPONINI in the last 168 hours. BNP (last 3 results) No results for input(s): PROBNP in the last 8760 hours. HbA1C: No results for input(s): HGBA1C in the last 72 hours. CBG: No results for input(s): GLUCAP in the last 168 hours. Lipid Profile: No results for input(s): CHOL, HDL, LDLCALC, TRIG, CHOLHDL, LDLDIRECT in the last 72 hours. Thyroid Function Tests: No results for input(s): TSH, T4TOTAL, FREET4, T3FREE, THYROIDAB in the last 72 hours. Anemia Panel: No results for input(s): VITAMINB12, FOLATE, FERRITIN, TIBC, IRON, RETICCTPCT in the last 72 hours. Sepsis Labs: No results for input(s): PROCALCITON, LATICACIDVEN in the last 168 hours.  No results found for this or any previous visit (from the past 240 hour(s)).       Radiology Studies: Dg Chest 2 View  Result Date: 05/30/2016 CLINICAL DATA:  Chest tube. EXAM: CHEST  2 VIEW COMPARISON:  Yesterday FINDINGS: Right basilar chest tube in stable position. Small basilar pneumothorax around the tube. No apical pneumothorax. Pleural fluid that is loculated in the right major fissure and at the left base. Generalized interstitial coarsening with Awanda Mink lines in this patient with emphysema and possible mild edema. Normal heart size. IMPRESSION: 1. Small pneumothorax around the basal chest tube on the right. 2. Stable loculated pleural fluid in the right major fissure and at the left base. 3. Mild edema superimposed on emphysema. Electronically Signed   By: Monte Fantasia M.D.   On: 05/30/2016 07:29   Dg Chest Port 1 View  Result Date: 05/29/2016 CLINICAL DATA:  71 year old male status post right side VATS for empyema on 05/20/2016. Chest tube. Initial  encounter. EXAM: PORTABLE CHEST 1 VIEW COMPARISON:  05/28/2016 and earlier. FINDINGS: Portable AP upright view at 0621 hours. Right IJ central line removed. Right lung base chest tube remains. No pneumothorax identified. Less veiling opacity at the right lung base, but more pleural fluid in the right minor fissure today. Dense retrocardiac opacity persists. Stable cardiac size and mediastinal contours. Visualized tracheal air column is within normal limits. Stable to mildly decreased bilateral pulmonary interstitial opacity. IMPRESSION: 1. Right IJ central line removed. Right chest tube remains. No pneumothorax. 2. Less pleural  effusion evident at the right lung base but more pleural fluid in the right minor fissure today. 3. Bilateral lower lobe collapse or consolidation. Electronically Signed   By: Genevie Ann M.D.   On: 05/29/2016 08:47        Scheduled Meds: . amoxicillin-clavulanate  1 tablet Oral Q12H  . aspirin  81 mg Oral Daily  . feeding supplement (ENSURE ENLIVE)  237 mL Oral TID BM  . feeding supplement (PRO-STAT SUGAR FREE 64)  30 mL Oral BID  . fluticasone  1 spray Each Nare Daily  . furosemide  40 mg Oral Daily  . geriatric multivitamins-minerals  15 mL Oral TID WC  . guaiFENesin  1,200 mg Oral BID  . mouth rinse  15 mL Mouth Rinse BID  . Melatonin  3 mg Oral Q24H  . metolazone  5 mg Oral Daily  . nicotine  7 mg Transdermal Daily  . pantoprazole  40 mg Oral Daily  . senna-docusate  1 tablet Oral QHS  . sildenafil  20 mg Oral BID  . sodium chloride flush  10-40 mL Intracatheter Q12H  . spironolactone  25 mg Oral Daily  . tamsulosin  0.4 mg Oral QPC breakfast  . warfarin  4 mg Oral q1800  . Warfarin - Physician Dosing Inpatient   Does not apply q1800   Continuous Infusions: . dextrose 5 % and 0.9% NaCl Stopped (05/26/16 1800)     LOS: 16 days    Time spent: [place], [place], MD [facility] Pager [phone]  If 7PM-7AM, please contact  night-coverage [email or web address] Password [facility] 05/30/2016, 3:45 PM

## 2016-05-30 NOTE — Progress Notes (Signed)
Referral for PCP needs received- spoke with pt at bedside- per pt he has a PCP at the [facility] clinic- last seen in Nov 2017- Dr. Beckey Downing.

## 2016-05-30 NOTE — Discharge Instructions (Addendum)
Information on my medicine - Coumadin   (Warfarin)  This medication education was reviewed with me or my healthcare representative as part of my discharge preparation.  The pharmacist that spoke with me during my hospital stay was:  Dareen Piano, [facility]  Why was Coumadin prescribed for you? Coumadin was prescribed for you because you have a blood clot or a medical condition that can cause an increased risk of forming blood clots. Blood clots can cause serious health problems by blocking the flow of blood to the heart, lung, or brain. Coumadin can prevent harmful blood clots from forming. As a reminder your indication for Coumadin is:   Stroke Prevention Because Of Atrial Fibrillation  What test will check on my response to Coumadin? While on Coumadin (warfarin) you will need to have an INR test regularly to ensure that your dose is keeping you in the desired range. The INR (international normalized ratio) number is calculated from the result of the laboratory test called prothrombin time (PT).  If an INR APPOINTMENT HAS NOT ALREADY BEEN MADE FOR YOU please schedule an appointment to have this lab work done by your health care provider within 7 days. Your INR goal is usually a number between:  2 to 3 or your provider may give you a more narrow range like 2-2.5.  Ask your health care provider during an office visit what your goal INR is.  What  do you need to  know  About  COUMADIN? Take Coumadin (warfarin) exactly as prescribed by your healthcare provider about the same time each day.  DO NOT stop taking without talking to the doctor who prescribed the medication.  Stopping without other blood clot prevention medication to take the place of Coumadin may increase your risk of developing a new clot or stroke.  Get refills before you run out.  What do you do if you miss a dose? If you miss a dose, take it as soon as you remember on the same day then continue your regularly scheduled regimen the  next day.  Do not take two doses of Coumadin at the same time.  Important Safety Information A possible side effect of Coumadin (Warfarin) is an increased risk of bleeding. You should call your healthcare provider right away if you experience any of the following: ? Bleeding from an injury or your nose that does not stop. ? Unusual colored urine (red or dark brown) or unusual colored stools (red or black). ? Unusual bruising for unknown reasons. ? A serious fall or if you hit your head (even if there is no bleeding).  Some foods or medicines interact with Coumadin (warfarin) and might alter your response to warfarin. To help avoid this: ? Eat a balanced diet, maintaining a consistent amount of Vitamin K. ? Notify your provider about major diet changes you plan to make. ? Avoid alcohol or limit your intake to 1 drink for women and 2 drinks for men per day. (1 drink is 5 oz. wine, 12 oz. beer, or 1.5 oz. liquor.)  Make sure that ANY health care provider who prescribes medication for you knows that you are taking Coumadin (warfarin).  Also make sure the healthcare provider who is monitoring your Coumadin knows when you have started a new medication including herbals and non-prescription products.  Coumadin (Warfarin)  Major Drug Interactions  Increased Warfarin Effect Decreased Warfarin Effect  Alcohol (large quantities) Antibiotics (esp. Septra/Bactrim, Flagyl, Cipro) Amiodarone (Cordarone) Aspirin (ASA) Cimetidine (Tagamet) Megestrol (Megace) NSAIDs (ibuprofen,  naproxen, etc.) Piroxicam (Feldene) Propafenone (Rythmol SR) Propranolol (Inderal) Isoniazid (INH) Posaconazole (Noxafil) Barbiturates (Phenobarbital) Carbamazepine (Tegretol) Chlordiazepoxide (Librium) Cholestyramine (Questran) Griseofulvin Oral Contraceptives Rifampin Sucralfate (Carafate) Vitamin K   Coumadin (Warfarin) Major Herbal Interactions  Increased Warfarin Effect Decreased Warfarin Effect   Garlic Ginseng Ginkgo biloba Coenzyme Q10 Green tea St. Johns wort    Coumadin (Warfarin) FOOD Interactions  Eat a consistent number of servings per week of foods HIGH in Vitamin K (1 serving =  cup)  Collards (cooked, or boiled & drained) Kale (cooked, or boiled & drained) Mustard greens (cooked, or boiled & drained) Parsley *serving size only =  cup Spinach (cooked, or boiled & drained) Swiss chard (cooked, or boiled & drained) Turnip greens (cooked, or boiled & drained)  Eat a consistent number of servings per week of foods MEDIUM-HIGH in Vitamin K (1 serving = 1 cup)  Asparagus (cooked, or boiled & drained) Broccoli (cooked, boiled & drained, or raw & chopped) Brussel sprouts (cooked, or boiled & drained) *serving size only =  cup Lettuce, raw (green leaf, endive, romaine) Spinach, raw Turnip greens, raw & chopped   These websites have more information on Coumadin (warfarin):  FailFactory.se; VeganReport.com.au;   Thoracotomy, Care After This sheet gives you information about how to care for yourself after your procedure. Your doctor may also give you more specific instructions. If you have problems or questions, contact your doctor. Follow these instructions at home: Preventing lung infection ( pneumonia)  Take deep breaths or do breathing exercises as told by your doctor.  Cough often. Coughing is important to clear thick spit (phlegm) and open your lungs. If coughing hurts, hold a pillow against your chest or place both hands flat on top of your cut (splinting) when you cough. This may help with discomfort.  Use an incentive spirometer as told. This is a tool that measures how well you fill your lungs with each breath.  Do lung therapy (pulmonary rehabilitation) as told. Medicines  Take over-the-counter or prescription medicines only as told by your doctor.  If you have pain, take pain-relieving medicine before your pain gets very bad.  This will help you breathe and cough more comfortably.  If you were prescribed an antibiotic medicine, take it as told by your doctor. Do not stop taking the antibiotic even if you start to feel better. Activity  Ask your doctor what activities are safe for you.  Do not travel by airplane for 2 weeks after your chest tube is removed, or until your doctor says that this is safe.  Do not lift anything that is heavier than 10 lb (4.5 kg), or the limit that your doctor tells you, until he or she says that it is safe.  Do not drive until your doctor approves.  Do not drive or use heavy machinery while taking prescription pain medicine. Incision care  Follow instructions from your doctor about how to take care of your cut from surgery (incision). Make sure you:  Wash your hands with soap and water before you change your bandage (dressing). If you cannot use soap and water, use hand sanitizer.  Change your bandage as told by your doctor.  Leave stitches (sutures), skin glue, or skin tape (adhesive) strips in place. They may need to stay in place for 2 weeks or longer. If tape strips get loose and curl up, you may trim the loose edges. Do not remove tape strips completely unless your doctor says it is okay.  Keep your bandage  dry.  Check your cut from surgery every day for signs of infection. Check for:  More redness, swelling, or pain.  More fluid or blood.  Warmth.  Pus or a bad smell. Bathing  Do not take baths, swim, or use a hot tub until your doctor approves. You may take showers.  After your bandage has been removed, use soap and water to gently wash your cut from surgery. Do not use anything else to clean your cut unless your doctor tells you to. Eating and drinking  Eat a healthy diet as told by your doctor. A healthy diet includes:  Fresh fruits and vegetables.  Whole grains.  Low-fat (lean) proteins.  Drink enough fluid to keep your pee (urine) clear or pale  yellow. General instructions  To prevent or treat trouble pooping (constipation) while you are taking prescription pain medicine, your doctor may recommend that you:  Take over-the-counter or prescription medicines.  Eat foods that are high in fiber. These include fresh fruits and vegetables, whole grains, and beans.  Limit foods that are high in fat and processed sugars, such as fried and sweet foods.  Do not use any products that contain nicotine or tobacco. These include cigarettes and e-cigarettes. If you need help quitting, ask your doctor.  Avoid secondhand smoke.  Wear compression stockings as told. These help to prevent blood clots and reduce swelling in your legs.  If you have a chest tube, care for it as told.  Keep all follow-up visits as told by your doctor. This is important. Contact a doctor if:  You have more redness, swelling, or pain around your cut from surgery.  You have more fluid or blood coming from your cut from surgery.  Your cut from surgery feels warm to the touch.  You have pus or a bad smell coming from your cut from surgery.  You have a fever or chills.  Your heartbeat seems uneven.  You feel sick to your stomach (nauseous).  You throw up (vomit).  You have muscle aches.  You have trouble pooping (having a bowel movement). This may mean that you:  Poop fewer times in a week than normal.  Have a hard time pooping.  Have poop that is dry, hard, or bigger than normal. Get help right away if:  You get a rash.  You feel light-headed.  You feel like you might pass out (faint).  You are short of breath.  You have trouble breathing.  You are confused.  You have trouble talking.  You have problems with your seeing (vision).  You are not able to move.  You lose feeling (have numbness) in your:  Face.  Arms.  Legs.  You pass out.  You have a sudden, bad headache.  You feel weak.  You have chest pain.  You have pain  that:  Is very bad.  Gets worse, even with medicine. Summary  Take deep breaths, do breathing exercises, and cough often. This helps prevent lung infection (pneumonia).  Do not drive until your doctor approves. Do not travel by airplane for 2 weeks after your chest tube is removed, or until your doctor says that this is safe.  Check your cut from surgery every day for signs of infection.  Eat a healthy diet. This includes fresh fruits and vegetables, whole grains, and low-fat (lean) proteins. This information is not intended to replace advice given to you by your health care provider. Make sure you discuss any questions you have with your  health care provider. Document Released: 10/29/2011 Document Revised: 01/22/2016 Document Reviewed: 01/22/2016 Elsevier Interactive Patient Education  2017 [employer].

## 2016-05-30 NOTE — Progress Notes (Addendum)
[address]       [place] [address]             [phone]       10 Days Post-Op Procedure(s) (LRB): VIDEO ASSISTED THORACOSCOPY (VATS) with drainage of pleural effusion (Right) VIDEO BRONCHOSCOPY WITH ENDOBRONCHIAL ULTRASOUND (Right)  Subjective: Patient eating breakfast and without complaints.  Objective: Vital signs in last 24 hours: Temp:  [97.7 F (36.5 C)-98 F (36.7 C)] 98 F (36.7 C) (01/18 0407) Pulse Rate:  [60-83] 82 (01/18 0407) Cardiac Rhythm: Atrial fibrillation (01/17 2030) Resp:  [18-20] 20 (01/18 0407) BP: (100-113)/(57-70) 100/57 (01/18 0407) SpO2:  [92 %-97 %] 94 % (01/18 0407)      Intake/Output from previous day: 01/17 0701 - 01/18 0700 In: 600 [P.O.:600] Out: 2502 [Urine:2052; Chest Tube:450]   Physical Exam:  Cardiovascular: IRRR IRRR Pulmonary: Slightly diminished right base Abdomen: Soft, non tender, bowel sounds present. Extremities: Bilateral lower extremity edema, ted hose in place Wounds: Clean and dry.  No erythema or signs of infection. Chest Tube: to water seal, no air leak  Lab Results: CBC: Recent Labs  05/28/16 0247 05/29/16 0337  WBC 13.4* 12.0*  HGB 10.5* 11.0*  HCT 31.7* 33.8*  PLT 438* 436*   BMET:  Recent Labs  05/29/16 0337 05/30/16 0346  NA 139  138 140  139  K 4.1  4.0 4.2  4.1  CL 105  103 105  104  CO2 _0 GLUCOSE 85  82 106*  103*  BUN 40*  41* 41*  41*  CREATININE 0.95  0.98 1.01  0.94  CALCIUM 8.1*  8.1* 8.0*  8.0*    PT/INR:  Recent Labs  05/30/16 0346  LABPROT 22.9*  INR 1.99   ABG:  INR: Will add last result for INR, ABG once components are confirmed Will add last 4 CBG results once components are confirmed  Assessment/Plan:  1. CV - A fib with HR in the 80's. On Spirolactone 25 mg daily and Coumadin. INR slightly decreased to 1.99. Continue with Coumadin 4 mg daily for now. 2.  Pulmonary - History of COPD. Chest tube with 450 cc  of output last 24 hours. CXR this am shows small basilar ptx, small right pleural effusion. Chest tube to remain for now. May need to be placed to a mini express if output does not decrease. On 2 liters of oxygen via [place].  3. Anemia-H and H 11 and 33.8. 4. ID-On Augmentin (day# 3/7) 5. Management per medicine.   ZIMMERMAN,DONIELLE MPA-C 05/30/2016,8:26 AM Patient examined and chest x-ray reviewed. 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 Also low albumin level is contributing to the persistent pleural drainage. Will increase diuretic therapy-not a candidate for ACE inhibitor  due to borderline blood pressure and elevated Bun - will add low dose revatio/sildenefil  for pulmonary hypertension  The patient has been seen by nutritional services to optimize protein intake.  Will switch chest tube to mini express as the patient may be discharged with his chest tube and followed in the office  with home health care.  Tharon Aquas trigt M.D.

## 2016-05-31 ENCOUNTER — Inpatient Hospital Stay (HOSPITAL_COMMUNITY): Payer: Medicare Other

## 2016-05-31 ENCOUNTER — Telehealth: Payer: Self-pay

## 2016-05-31 LAB — RENAL FUNCTION PANEL
ANION GAP: 6 (ref 5–15)
Albumin: 2 g/dL — ABNORMAL LOW (ref 3.5–5.0)
BUN: 44 mg/dL — AB (ref 6–20)
CHLORIDE: 100 mmol/L — AB (ref 101–111)
CO2: 30 mmol/L (ref 22–32)
Calcium: 8 mg/dL — ABNORMAL LOW (ref 8.9–10.3)
Creatinine, Ser: 1.09 mg/dL (ref 0.61–1.24)
Glucose, Bld: 126 mg/dL — ABNORMAL HIGH (ref 65–99)
PHOSPHORUS: 2.8 mg/dL (ref 2.5–4.6)
POTASSIUM: 4.1 mmol/L (ref 3.5–5.1)
Sodium: 136 mmol/L (ref 135–145)

## 2016-05-31 LAB — PROTIME-INR
INR: 2.05
PROTHROMBIN TIME: 23.4 s — AB (ref 11.4–15.2)

## 2016-05-31 MED ORDER — WARFARIN SODIUM 4 MG PO TABS: 4.0000 mg | ORAL_TABLET | Freq: Every day | ORAL | 0 refills | Status: DC

## 2016-05-31 MED ORDER — PANTOPRAZOLE SODIUM 40 MG PO TBEC: 40.0000 mg | DELAYED_RELEASE_TABLET | Freq: Every day | ORAL | 0 refills | Status: DC

## 2016-05-31 MED ORDER — NICOTINE 7 MG/24HR TD PT24: 7.0000 mg | MEDICATED_PATCH | Freq: Every day | TRANSDERMAL | 0 refills | Status: DC

## 2016-05-31 MED ORDER — FUROSEMIDE 40 MG PO TABS: 40.0000 mg | ORAL_TABLET | Freq: Every day | ORAL | 0 refills | Status: DC

## 2016-05-31 MED ORDER — SILDENAFIL CITRATE 20 MG PO TABS: 20.0000 mg | ORAL_TABLET | Freq: Two times a day (BID) | ORAL | 0 refills | Status: DC

## 2016-05-31 MED ORDER — SPIRONOLACTONE 25 MG PO TABS: 25.0000 mg | ORAL_TABLET | Freq: Every day | ORAL | 0 refills | Status: DC

## 2016-05-31 MED ORDER — SENNOSIDES-DOCUSATE SODIUM 8.6-50 MG PO TABS
1.0000 | ORAL_TABLET | Freq: Every evening | ORAL | 0 refills | Status: DC | PRN
Start: 2016-05-31 — End: 2016-10-02

## 2016-05-31 MED ORDER — AMOXICILLIN-POT CLAVULANATE 875-125 MG PO TABS: 1.0000 | ORAL_TABLET | Freq: Two times a day (BID) | ORAL | 0 refills | Status: DC

## 2016-05-31 NOTE — Progress Notes (Signed)
Physical Therapy Treatment Patient Details Name: [name] MRN: [id number] DOB: 07-May-1946 Today's Date: 05/31/2016    History of Present Illness 71 year old male with past medical history of prostate cancer, tobacco use and COPD admitted for one week of worsening dyspnea and admitted for acute respiratory failure with hypoxia secondary to pneumonia and heart failure.  Pt s/p thoracentesis on 1/2 with 1.5 liter removal.  05/18/15 pleural effusion, 1/8 VATS    PT Comments    Patient able to ambulate with no assistive device with good balance.  Patient scored 21/24 on DGI balance assessment - low fall risk.  Patient declining RW and able to ambulate without it safely.  Patient to d/c home today.   Follow Up Recommendations  No PT follow up     Equipment Recommendations  None recommended by PT    Recommendations for Other Services       Precautions / Restrictions Precautions Precautions: Other (comment) Precaution Comments: chest tube Restrictions Weight Bearing Restrictions: No    Mobility  Bed Mobility               General bed mobility comments: Patient sitting EOB  Transfers Overall transfer level: Modified independent Equipment used: None Transfers: Sit to/from Stand Sit to Stand: Modified independent (Device/Increase time)         General transfer comment: Increased time  Ambulation/Gait Ambulation/Gait assistance: Supervision Ambulation Distance (Feet): 300 Feet Assistive device: None Gait Pattern/deviations: Step-through pattern;Decreased stride length;Trunk flexed Gait velocity: decreased Gait velocity interpretation: Below normal speed for age/gender General Gait Details: Patient with slow, steady gait.  Cues to stand upright.  No loss of balance during gait with no assistive device.   Stairs Stairs: Yes   Stair Management: One rail Left;Alternating pattern;Forwards;Step to pattern;Sideways Number of Stairs: 5 General stair comments:  Instructed patient on use of 1 rail with both hands using sideways technique.  Wheelchair Mobility    Modified Rankin (Stroke Patients Only)       Balance Overall balance assessment: Modified Independent                                  Cognition Arousal/Alertness: Awake/alert Behavior During Therapy: WFL for tasks assessed/performed Overall Cognitive Status: Within Functional Limits for tasks assessed                      Exercises      General Comments        Pertinent Vitals/Pain Pain Assessment: No/denies pain    Home Living                      Prior Function            PT Goals (current goals can now be found in the care plan section) Progress towards PT goals: Progressing toward goals    Frequency    Min 3X/week      PT Plan Current plan remains appropriate;Equipment recommendations need to be updated    Co-evaluation             End of Session Equipment Utilized During Treatment: Gait belt Activity Tolerance: Patient tolerated treatment well Patient left: in bed;with call bell/phone within reach;with family/visitor present (sitting EOB)     Time: 3790-2409 PT Time Calculation (min) (ACUTE ONLY): 10 min  Charges:  $Gait Training: 8-22 mins  G Codes:      Despina Pole 05/31/2016, 2:15 PM Carita Pian Sanjuana Kava, [place] Pager [phone]

## 2016-05-31 NOTE — Care Management Important Message (Signed)
Important Message  Patient Details  Name: [name] MRN: [id number] Date of Birth: November 09, 1945   Medicare Important Message Given:  Yes    Nathen May 05/31/2016, 4:52 PM

## 2016-05-31 NOTE — Progress Notes (Signed)
Order received to discharge patient.  Discharge instructions, follow up, medications and instructions for their use were discussed with patient.  Telemetry monitor removed and CCMD notified.  PIV access removed.

## 2016-05-31 NOTE — Care Management Note (Addendum)
Case Management Note Previous CM note initiated by Zenon Mayo, RN-05/28/2016, 1:50 PM    Patient Details  Name: [name] MRN: [id number] Date of Birth: Oct 29, 1945  Subjective/Objective:    S/p VATS , drainage of pl effusion, per pt eval no f/u needed, but recs rolling walker, NCM went in to speak with patient about walker and he was sleeping , will check back with patient later today. NCM spoke with patient , informed him that pt eval is rec rolling walker for him, he states he does not think he need a rolling walker and does not want one.  NCM will cont to follow for dc needs.                  Action/Plan: Pt tx from 4E-SDU- plan to return home- per conversation with pt he has a PCP at the [facility] clinic-   Expected Discharge Date:  05/31/16               Expected Discharge Plan:  [place]  In-House Referral:  NA  Discharge planning Services  CM Consult  Post Acute Care Choice:  Home Health Choice offered to:  NA, Patient  DME Arranged:    DME Agency:     HH Arranged:  RN [place] Agency:  [facility]  Status of Service:  Completed, signed off  If discussed at [place] of Stay Meetings, dates discussed:    Discharge Disposition: home with home health   Additional Comments:  05/31/16- 1200- Marvetta Gibbons RN, CM- pt for d/c home today- will d/c home with chest tube in place to mini express- order for [facility] placed pt will also need INR check next week either 1/23 or 1/24 - spoke with pt at bedside- choice offered for [facility] services pt selected [facility] for [facility] services- referral called to Santiago Glad with [facility]- however they are unable to accept referral- calls made to several other agencies- [place] able to accept referral- spoke with pt at bedside- pt agreeable to using [facility] [place]- referral given to [place] with [facility] for [facility] with INR checks.   1244-update-received notice from [place] with [facility] that they would be unable  to accept referral after all- due to staffing- have reached out to [facility]- per Vickie they are unable to accept- have reached out to [facility] and awaiting return call to see if they can accept referral.   Dawayne Patricia, RN 05/31/2016, 12:19 PM [phone]

## 2016-05-31 NOTE — Care Management (Addendum)
Case Management Note Previous CM note initiated by Zenon Mayo, RN-05/28/2016, 1:50 PM    Patient Details  Name: [name] MRN: [id number] Date of Birth: 02-16-1946  Subjective/Objective:    S/p VATS , drainage of pl effusion, per pt eval no f/u needed, but recs rolling walker, NCM went in to speak with patient about walker and he was sleeping , will check back with patient later today. NCM spoke with patient , informed him that pt eval is rec rolling walker for him, he states he does not think he need a rolling walker and does not want one.  NCM will cont to follow for dc needs.                  Action/Plan: Pt tx from 4E-SDU- plan to return home- per conversation with pt he has a PCP at the [facility] clinic-   Expected Discharge Date:  05/31/16                     Expected Discharge Plan:  [place]  In-House Referral:  NA  Discharge planning Services  CM Consult  Post Acute Care Choice:  Home Health Choice offered to:  NA, Patient  DME Arranged:  N/A  DME Agency:   N/A  HH Arranged:  RN [place] Agency:  [facility] Rib[facility]  Status of Service:  Completed, signed off  If discussed at [place] of Stay Meetings, dates discussed:    Discharge Disposition: home with home health   Additional Comments: [id number] Jacqlyn Krauss, [place] Received call from [facility] that they can not accept patient. CM did reach out to [facility] in regards to seeing the patient. Pt has already been d/c and left the building. CM did reach out to physician advisor in regards to assistance- Pt will be set up with [facility]- will see pt within 24-48 hours post d/c. [name]    05-31-16 [address] [place] [phone] CM did call [facility] and willing to accept insurance for [place]. SOC to begin within 24-48 hours post d/c. RN to assist with INR Checks as well. CM will make Staff RN aware that  pt is set up with agency. No further needs from CM at this time.     05/31/16- 1200- Kristi Webster RN, CM- pt for d/c home today- will d/c home with chest tube in place to mini express- order for [facility] placed pt will also need INR check next week either 1/23 or 1/24 - spoke with pt at bedside- choice offered for [facility] services pt selected [facility] for [facility] services- referral called to Santiago Glad with [facility]- however they are unable to accept referral- calls made to several other agencies- [place] able to accept referral- spoke with pt at bedside- pt agreeable to using [facility] [place]- referral given to [place] with [facility] for [facility] with INR checks.   1244-update-received notice from [place] with [facility] that they would be unable to accept referral after all- due to staffing- have reached out to [facility]- per Vickie they are unable to accept- have reached out to [facility] and awaiting return call to see if they can accept referral.

## 2016-05-31 NOTE — Progress Notes (Addendum)
[address]       [place] [address]             [phone]      11 Days Post-Op Procedure(s) (LRB): VIDEO ASSISTED THORACOSCOPY (VATS) with drainage of pleural effusion (Right) VIDEO BRONCHOSCOPY WITH ENDOBRONCHIAL ULTRASOUND (Right) Subjective: Feels fair, not sleeping well and uncomfortable  Objective: Vital signs in last 24 hours: Temp:  [97.7 F (36.5 C)-97.8 F (36.6 C)] 97.8 F (36.6 C) (01/19 0434) Pulse Rate:  [70-86] 70 (01/19 0434) Cardiac Rhythm: Atrial fibrillation (01/18 2130) Resp:  [18] 18 (01/19 0434) BP: (96-108)/(51-59) 108/53 (01/19 0434) SpO2:  [91 %-95 %] 95 % (01/19 0434)  Hemodynamic parameters for last 24 hours:    Intake/Output from previous day: 01/18 0701 - 01/19 0700 In: 1560 [P.O.:1560] Out: 2870 [Urine:2600; Chest Tube:270] Intake/Output this shift: No intake/output data recorded.  General appearance: alert, cooperative, fatigued and no distress Heart: irregularly irregular rhythm Lungs: dim mildly in bases Wound: incis healing well   Lab Results:  Recent Labs  05/29/16 0337  WBC 12.0*  HGB 11.0*  HCT 33.8*  PLT 436*   BMET:  Recent Labs  05/30/16 0346 05/31/16 0250  NA 140  139 136  K 4.2  4.1 4.1  CL 105  104 100*  CO2 _0 GLUCOSE 106*  103* 126*  BUN 41*  41* 44*  CREATININE 1.01  0.94 1.09  CALCIUM 8.0*  8.0* 8.0*    PT/INR:  Recent Labs  05/31/16 0250  LABPROT 23.4*  INR 2.05   ABG    Component Value Date/Time   PHART 7.467 (H) 05/22/2016 0354   HCO3 29.7 (H) 05/22/2016 0354   TCO2 31 05/22/2016 0354   O2SAT 93.0 05/22/2016 0354   CBG (last 3)  No results for input(s): GLUCAP in the last 72 hours.  Meds Scheduled Meds: . amoxicillin-clavulanate  1 tablet Oral Q12H  . aspirin  81 mg Oral Daily  . feeding supplement (ENSURE ENLIVE)  237 mL Oral TID BM  . feeding supplement (PRO-STAT SUGAR FREE 64)  30 mL Oral BID  . fluticasone  1 spray Each Nare Daily  .  furosemide  40 mg Oral Daily  . geriatric multivitamins-minerals  15 mL Oral TID WC  . guaiFENesin  1,200 mg Oral BID  . mouth rinse  15 mL Mouth Rinse BID  . Melatonin  3 mg Oral Q24H  . metolazone  5 mg Oral Daily  . nicotine  7 mg Transdermal Daily  . pantoprazole  40 mg Oral Daily  . senna-docusate  1 tablet Oral QHS  . sildenafil  20 mg Oral BID  . sodium chloride flush  10-40 mL Intracatheter Q12H  . spironolactone  25 mg Oral Daily  . tamsulosin  0.4 mg Oral QPC breakfast  . warfarin  4 mg Oral q1800  . Warfarin - Physician Dosing Inpatient   Does not apply q1800   Continuous Infusions: . dextrose 5 % and 0.9% NaCl Stopped (05/26/16 1800)   PRN Meds:.antiseptic oral rinse, ipratropium, levalbuterol, ondansetron (ZOFRAN) IV, oxyCODONE, phenol, senna-docusate, sodium chloride, sodium chloride flush, traMADol  Xrays Dg Chest 2 View  Result Date: 05/30/2016 CLINICAL DATA:  Chest tube. EXAM: CHEST  2 VIEW COMPARISON:  Yesterday FINDINGS: Right basilar chest tube in stable position. Small basilar pneumothorax around the tube. No apical pneumothorax. Pleural fluid that is loculated in the right major fissure and at the left base. Generalized  interstitial coarsening with [name] lines in this patient with emphysema and possible mild edema. Normal heart size. IMPRESSION: 1. Small pneumothorax around the basal chest tube on the right. 2. Stable loculated pleural fluid in the right major fissure and at the left base. 3. Mild edema superimposed on emphysema. Electronically Signed   By: [name] M.D.   On: 05/30/2016 07:29   Dg Chest Port 1 View  Result Date: 05/31/2016 CLINICAL DATA:  Post VATS, shortness of breath, weakness, followup shortness of breath, weakness, followup, chest tube EXAM: PORTABLE CHEST 1 VIEW COMPARISON:  Portable exam 0749 hours compared 05/30/2016 FINDINGS: Basilar RIGHT thoracostomy tube unchanged. Upper normal heart size. Slightly rotated to the LEFT, which may  account for slight prominence of the RIGHT hilum. Diffuse interstitial infiltrates throughout both lungs likely pulmonary edema. Small bibasilar effusions. No pneumothorax. Asymmetric RIGHT apical pleural density appears unchanged. Bones demineralized. IMPRESSION: BILATERAL diffuse interstitial infiltrates minimally increased since previous exam question pulmonary edema. Small bibasilar pleural effusions. RIGHT thoracostomy tube without pneumothorax. Electronically Signed   By: [name] M.D.   On: 05/31/2016 08:17    Assessment/Plan: S/P Procedure(s) (LRB): VIDEO ASSISTED THORACOSCOPY (VATS) with drainage of pleural effusion (Right) VIDEO BRONCHOSCOPY WITH ENDOBRONCHIAL ULTRASOUND (Right)  1 afebrile, no new cbc , drainage is serosang.- cont mini-express- home health at discharge to manage. 2 plans as outlined- home when ok with primary svc  LOS: 17 days    [name],[name] 05/31/2016 Chest tube drainage over 24 hours down to  150 cc Final cultures of empyema material no growth Surgical incision healing well Ambulating without need for supplemental oxygen Patient safe for discharge home with chest tube and mini express. Home health nurse face-to-face documentation completed for care of chest tube  Pathology of transbronchial biopsy of subcarinal mass negative for malignancy, demonstrates granulomatous changes of inflammation  Patient may shower with OpSite dressing over chest tube site Importance of smoking cessation and high-protein diet reinforced with patient.  We'll follow in office and remove chest tube when appropriate  Patient examined, agree with above assessment and plan by [name], [place]

## 2016-05-31 NOTE — Discharge Summary (Signed)
Physician Discharge Summary  [name] OHY:[id number] DOB: 07-Jul-1945 DOA: 05/13/2016  PCP: [name] Follows at [facility] date: 05/13/2016 Discharge date: 05/31/2016  Time spent: 45 minutes  Recommendations for Outpatient Follow-up:  1. Will need management of chest tube and drain as an outpatient and at home with home health 2. Home health therapy has been requested for the patient 3. Cardiology will set up an appointment with the patient has an outpatient for follow-up of new onset A. fib as well as mild heart failure 4. Needs imaging of the lungs and subcarina area probably within a month or so given history of smoking  Discharge Diagnoses:  Principal Problem:   Acute respiratory failure with hypoxia (HCC) Active Problems:   Bilateral pleural effusion   COPD (chronic obstructive pulmonary disease) (HCC)   Tobacco abuse   Protein-calorie malnutrition, severe (HCC)   S/P thoracentesis   Lung mass   Loculated pleural effusion   Parapneumonic effusion   Empyema (HCC)   Bilateral lower extremity edema   Atrial fibrillation (HCC)   Septic shock (HCC)   Discharge Condition: Improved  Diet recommendation: Heart healthy low-salt  Filed Weights   05/22/16 0600 05/25/16 0600 05/26/16 0500  Weight: 82.1 kg (181 lb) 82.6 kg (182 lb 1.6 oz) 82.6 kg (182 lb 3.2 oz)    History of present illness:  71 year old male COPD Prostate cancer followed by urology-Dr. [facility], [facility] ? Coagulopathy Right inguinal hernia repair 2004 Prior history is, cell carcinoma status post nasal surgery [facility] 06/2009  Initially admitted 05/14/2016 with a new diagnosis of atrial fibrillation as well as bilateral pleural effusions--he had been treated 4 days prior to admission with right-sided chest pain given Levaquin at a [facility] for possible pneumonia Came to the emergency room because of severe lower extremity swelling  Chest x-ray showed moderate right and left  in patchy airspace opacities WBC 20,000 Sodium 130 BUN/creatinine 40/1.2, usual baseline 0.73 TSH 1.5  CT scan chest showed no pulmonary embolism mediastinal mass with large right-sided effusion plus centrilobular emphysema  Thoracentesis was performed 05/17/16 and because of persisting fluid, CVTS was consulted and the patient underwent VATS and biopsy of subcarinal mass 05/20/2016 but developed hypotension     Right empyema s/p VATS 05/20/16-, culture data negative for acid-fast, fluid culture negative, fungal culture no growth to date. Previously on broad-spectrum vancomycin/Zosyn and narrowed to Augmentin 1/16-chest tube drainage being monitored daily Mini-express being considered prior to discharge with home health  Severe community-acquired pneumonia Empyema,  acute hypoxic respiratory failure- continue IS every 4, Atrovent every 6 when necessary, Flonase 1 spray, Mucinex's 1. 2 g twice a day.  Blood pressure is stable Transitioned from Zosyn to Augmentin as above--stop date 06/08/15  Atrial fibrillation, [place] score 2 on Coumadin Diastolic heart failure, EF 05/14/2016 65-70% Pulmonary hypertension Clinically compensated.  No rate controlling agent, previously on amiodarone, as is currently rate controlled and previously was in sepsis CV TS is placed him on Lasix 40, currently on metolazone 5 [discontinued prior to discharge ]and Aldactone 25 Started on Revatio 20 twice a day Have asked scheduler with cardiology for a close one-week follow-up for both heart failure and atrial fibrillation INR is 2.05 on discharge  Subcarinal mass-will need repeat imaging post discharge Pathology from 1/8 reviewed and confirmed no concerns for cancer  Severe hypotension-possible hypovolemia, sepsis was on pressors and blood pressures previously were in normal limits  Anemia-no completing current  BPH Continue Flomax 0.4  daily  Heart healthy diet Nicotine patch 7 mg/24  Moderate  protein energy malnutrition  Slightly elevated alkaline phosphatase-etiology uncler Has been worked up at [facility]  1/01 > admit. 1/02 > cards consult for A.fib > started on heparin 01/03. 1/02 > Thoracentesis 1537m removed > exudative / parapneumonic. 1/05 >right thora with 1268m>thick septations noted on USKoreareferred for VATS. 1/08 >[facility] he was taken to the OR for VATS and bx of subcarinal mass >hypotension >not fluid responsive >dopamine  1/09 > PCCM re-consulted.  STUDIES:  TTE (05/14/16):Mild LVH with EF 65-70%and normal wall motion. LA &RA moderately to severely dilated. RV moderately dilated with preserved systolic function. No aortic stenosis or regurgitation. Mildly dilated aortic root. Trivial mitral regurgitation without stenosis. D-shaped intraventricular septum suggesting RV pressure/volume overload. No pulmonic regurgitation. Mild tricuspid regurgitation. Trivial pericardial effusion. Right PFA (05/14/16): LDH 1044 / TP <3.0 / Glucose 33 / Albumin <1.0 / WBC 7920 (21% lymph, 60% neutro, 19% mono) / Ctx Negative / Cytology Negative Right PFA (05/17/16): LDH 970 / TP <3.0 / Ctx Negative / Cytology Negative  MICROBIOLOGY Blood Ctx x2 1/2: Negative  MRSA PCR 1/4: Negative  Wound/Right Lower Lung Ctx 05/20/16: Negative  Right Pleural Fluid 05/20/16: Bacteria Negative / AFB pending / Fungus pending Right Pleural Tissue Ctx 05/20/16: Bacteria Negative / AFB pending / Fungus pending  PATHOLOGY Right Pleural Peel 05/20/16: C/W Emphysema. No malignancy. Bronchial Wash &Brush RLL 05/20/16: No malignancy  EBUS FNA Level 7 05/20/16: No malignant cells. Granuloma formation.  ANTIBIOTICS Azithromycin 1/2 - 1/8 Rocephin 1/2 - 1/8 Cefepime 1/2 Vancomycin 1/2 - 1/12 Zosyn 1/8 >>1/18 Augmentin 1/16     Discharge Exam: Vitals:   05/30/16 2028 05/31/16 0434  BP: (!) 96/51 (!) 108/53  Pulse: 81 70   Resp: 18 18  Temp: 97.7 F (36.5 C) 97.8 F (36.6 C)    General: Alert pleasant oriented no apparent distress Cardiovascular: S1-S2 no murmur rub or gallop A. fib Respiratory: Clinically clear no added sound, chest tube in with Minipress  Discharge Instructions   Discharge Instructions    For home use only DME oxygen    Complete by:  As directed    Mode or (Route):  Nasal cannula   Liters per Minute:  2   Frequency:  Continuous (stationary and portable oxygen unit needed)   Oxygen delivery system:  Gas     Current Discharge Medication List    START taking these medications   Details  amoxicillin-clavulanate (AUGMENTIN) 875-125 MG tablet Take 1 tablet by mouth every 12 (twelve) hours. Qty: 14 tablet, Refills: 0    furosemide (LASIX) 40 MG tablet Take 1 tablet (40 mg total) by mouth daily. Qty: 30 tablet, Refills: 0    nicotine (NICODERM CQ - DOSED IN MG/24 HR) 7 mg/24hr patch Place 1 patch (7 mg total) onto the skin daily. Qty: 28 patch, Refills: 0    pantoprazole (PROTONIX) 40 MG tablet Take 1 tablet (40 mg total) by mouth daily. Qty: 30 tablet, Refills: 0    senna-docusate (SENOKOT-S) 8.6-50 MG tablet Take 1 tablet by mouth at bedtime as needed for mild constipation. Qty: 30 tablet, Refills: 0    sildenafil (REVATIO) 20 MG tablet Take 1 tablet (20 mg total) by mouth 2 (two) times daily. Qty: 60 tablet, Refills: 0    spironolactone (ALDACTONE) 25 MG tablet Take 1 tablet (25 mg total) by mouth daily. Qty: 30 tablet,  Refills: 0    warfarin (COUMADIN) 4 MG tablet Take 1 tablet (4 mg total) by mouth daily at 6 PM. Qty: 7 tablet, Refills: 0      CONTINUE these medications which have NOT CHANGED   Details  Ascorbic Acid (VITAMIN C) 1000 MG tablet Take 1,000 mg by mouth 2 (two) times daily.    aspirin 81 MG tablet Take 81 mg by mouth daily. Reported on 05/08/2015    fluocinonide-emollient (LIDEX-E) 0.05 % cream Apply 1 application topically daily as needed (for  skin).     oxyCODONE-acetaminophen (PERCOCET/ROXICET) 5-325 MG tablet Take 1-2 tablets by mouth every 4 (four) hours as needed for moderate pain. Qty: 30 tablet, Refills: 0    tamsulosin (FLOMAX) 0.4 MG CAPS capsule Take 1 capsule (0.4 mg total) by mouth daily after breakfast. Qty: 30 capsule, Refills: 1      STOP taking these medications     ibuprofen (ADVIL,MOTRIN) 200 MG tablet      levofloxacin (LEVAQUIN) 750 MG tablet        Allergies  Allergen Reactions  . Demerol [Meperidine] Other (See Comments)    UNSPECIFIED REACTION  Causes system to shutdown. ?    Follow-up Information    Schedule an appointment as soon as possible for a visit with [facility]   Why:  Please call your Primary Care MD- Dr. Beckey Downing- for f/u appointment in the next 1-2 weeks Contact information: [place] [address] [phone]        Tharon Aquas Kerby Less III, MD Follow up on 06/05/2016.   Specialty:  Cardiothoracic Surgery Why:  Appointment is at 1:45, please get CXR at 1:00 prior to your appointment wtih Dr. Prescott Gum Contact information: [address] [place] [address] [phone]        Schedule an appointment as soon as possible for a visit with [facility].   Specialty:  Internal Medicine Why:  Need to call to establish and confrim appt, booked until mid Feb Contact information: [place] [phone] [place], PA-C Follow up.   Specialties:  Cardiology, Radiology Why:  Cardiology Hospital Follow-up on 06/14/2016 at 10:30AM.  Contact information: [place] [address] [phone]            The results of significant diagnostics from this hospitalization (including imaging, microbiology, ancillary and laboratory) are listed below for reference.    Significant Diagnostic Studies: Dg Chest 2  View  Result Date: 05/30/2016 CLINICAL DATA:  Chest tube. EXAM: CHEST  2 VIEW COMPARISON:  Yesterday FINDINGS: Right basilar chest tube in stable position. Small basilar pneumothorax around the tube. No apical pneumothorax. Pleural fluid that is loculated in the right major fissure and at the left base. Generalized interstitial coarsening with Awanda Mink lines in this patient with emphysema and possible mild edema. Normal heart size. IMPRESSION: 1. Small pneumothorax around the basal chest tube on the right. 2. Stable loculated pleural fluid in the right major fissure and at the left base. 3. Mild edema superimposed on emphysema. Electronically Signed   By: Monte Fantasia M.D.   On: 05/30/2016 07:29   Dg Chest 2 View  Result Date: 05/17/2016 CLINICAL DATA:  Shortness of breath. EXAM: CHEST  2 VIEW COMPARISON:  Radiographs of May 14, 2016. FINDINGS: Stable cardiomegaly. Continued presence of bilateral diffuse interstitial densities are noted concerning for pulmonary edema.  Stable mild left pleural effusion is noted. Right pleural effusion is significantly increased compared to prior exam. No pneumothorax is noted. Bony thorax is unremarkable. IMPRESSION: Cardiomegaly and bilateral pulmonary edema with increased right pleural effusion consistent with congestive heart failure. Electronically Signed   By: Marijo Conception, M.D.   On: 05/17/2016 08:46   Dg Chest 2 View  Result Date: 05/13/2016 CLINICAL DATA:  On antibiotics for pneumonia. Increasing lower extremity swelling, shortness of breath, congestion and chest heaviness. History of COPD, prostate cancer. EXAM: CHEST  2 VIEW COMPARISON:  Chest radiograph August 08, 2015 FINDINGS: Moderate RIGHT and small LEFT layering pleural effusions. Diffuse coarsened pulmonary interstitium, with superimposed patchy airspace opacities. Radiopaque local granuloma. Calcified mediastinal lymph nodes. Cardiac silhouette partially obscured, overall normal in size. Mildly  calcified aortic knob. No pneumothorax. Soft tissue planes and included osseous structure nonsuspicious. Lower thoracic dextroscoliosis. IMPRESSION: Moderate RIGHT and small LEFT pleural effusions, patchy airspace opacities concerning for pneumonia, less likely pulmonary edema. COPD. Electronically Signed   By: Elon Alas M.D.   On: 05/13/2016 22:37   Ct Chest Wo Contrast  Result Date: 05/17/2016 CLINICAL DATA:  Worsening dyspnea.  COPD. EXAM: CT CHEST WITHOUT CONTRAST TECHNIQUE: Multidetector CT imaging of the chest was performed following the standard protocol without IV contrast. COMPARISON:  05/14/2016 FINDINGS: Cardiovascular: Normal heart size. Aortic atherosclerosis. Calcification in the LAD and RCA coronary arteries noted. No pericardial effusion. Mediastinum/Nodes: Calcified right paratracheal, right hilar, and sub- carinal lymph nodes identified compatible with prior granulomatous disease. Enlarged mediastinal lymph nodes are again noted. Within the sub- carinal region there is a 3.3 cm lymph node mass, image 84 of series 2. Similar to previous exam. There is a enlarged left paratracheal lymph node which appears noncalcified measuring 1.4 cm, image 69 of series 2. Also similar to previous exam. Left internal mammary lymph node appears enlarged measuring 1.4 cm, image number 55 of series 2. Unchanged from previous exam. Similar appearance of prominent right axillary lymph nodes which measure up to 11 mm, image 58 of series 2. Lungs/Pleura: Advanced changes of centrilobular emphysema identified. Large right pleural effusion now appears complex and loculated. Loculated gas within posterior right pleural fluid collection is noted measuring 4.8 cm and is new from 05/14/2016. Findings are worrisome for empyema. Small to moderate simple appearing left pleural effusion noted. Upper Abdomen: No acute abnormality identified. Musculoskeletal: Scoliosis deformity is noted involving the thoracic spine. There  is multi level degenerative disc disease identified. IMPRESSION: 1. Large complex right pleural effusion now appears loculated. There is a collection of gas within the posterior basilar component of the loculated right pleural effusion which is worrisome for empyema. 2. Persistent enlarged mediastinal lymph nodes and a left internal mammary lymph node. Differential considerations include metastatic adenopathy, lymphoproliferative disorder, granulomatous inflammation/infection or (less likely) reactive adenopathy. Consider further evaluation with PET-CT with possible tissue sampling following resolution of this acute episode. 3. Emphysema 4. Aortic atherosclerosis and coronary artery calcification 5. Scoliosis and degenerative disc disease 6. Prior granulomatous disease. Electronically Signed   By: Kerby Moors M.D.   On: 05/17/2016 17:35   Ct Angio Chest Pe W Or Wo Contrast  Result Date: 05/14/2016 CLINICAL DATA:  Bilateral leg swelling with dyspnea history of pneumonia EXAM: CT ANGIOGRAPHY CHEST WITH CONTRAST TECHNIQUE: Multidetector CT imaging of the chest was performed using the standard protocol during bolus administration of intravenous contrast. Multiplanar CT image reconstructions and MIPs were obtained to evaluate the vascular anatomy. CONTRAST:  100 mL  Isovue 370 intravenous COMPARISON:  05/13/2016 FINDINGS: Cardiovascular: Satisfactory opacification of the pulmonary arteries to the segmental level. No evidence of pulmonary embolism. Mild cardiomegaly. No large pericardial effusion. Atherosclerosis of the aorta. No dissection. Mediastinum/Nodes: Trachea is midline, there is mild debris within the distal trachea just above the carina. Thyroid gland is normal. Multiple enlarged mediastinal lymph nodes. AP window node measures 1.7 cm in short axis. Precarinal lymph node measures 1.5 cm in short axis. Poorly defined density in the sub- carinal region with different attenuation value from large pleural  effusion on the right. This measures approximately 6.9 cm transverse by 5.1 cm AP. The esophagus appears deviated to the left by mass lesion. Multiple calcifications within the right hilus and mediastinum, consistent with prior granulomatous disease. Mild axillary adenopathy. Lungs/Pleura: Large right-sided pleural effusion and small left-sided pleural effusion. Marked emphysematous disease bilaterally. Dense consolidation in the right lower lobe may reflect a pneumonia. Upper Abdomen: Nodular fullness of the adrenal glands. No acute abnormality. No suspicious bone lesions. Musculoskeletal: No acute or suspicious bone lesions are visualized. There are degenerative changes of the spine. Review of the MIP images confirms the above findings. IMPRESSION: 1. No CT evidence for acute pulmonary embolus or aortic dissection. 2. Large right-sided pleural effusion and small left-sided pleural effusion. Dense consolidation in the right lower lobe suspicious for a pneumonia. Marked emphysematous disease bilaterally. 3. Poorly defined soft tissue density within the subcarinal region measuring approximately 6.9 cm is suspicious for a mass. It abuts or involves the esophagus which is displaced to the left. 4. Moderate mediastinal adenopathy. Electronically Signed   By: Donavan Foil M.D.   On: 05/14/2016 02:25   Dg Chest Port 1 View  Result Date: 05/31/2016 CLINICAL DATA:  Post VATS, shortness of breath, weakness, followup shortness of breath, weakness, followup, chest tube EXAM: PORTABLE CHEST 1 VIEW COMPARISON:  Portable exam 0749 hours compared 05/30/2016 FINDINGS: Basilar RIGHT thoracostomy tube unchanged. Upper normal heart size. Slightly rotated to the LEFT, which may account for slight prominence of the RIGHT hilum. Diffuse interstitial infiltrates throughout both lungs likely pulmonary edema. Small bibasilar effusions. No pneumothorax. Asymmetric RIGHT apical pleural density appears unchanged. Bones demineralized.  IMPRESSION: BILATERAL diffuse interstitial infiltrates minimally increased since previous exam question pulmonary edema. Small bibasilar pleural effusions. RIGHT thoracostomy tube without pneumothorax. Electronically Signed   By: Lavonia [name] M.D.   On: 05/31/2016 08:17   Dg Chest Port 1 View  Result Date: 05/29/2016 CLINICAL DATA:  71 year old male status post right side VATS for empyema on 05/20/2016. Chest tube. Initial encounter. EXAM: PORTABLE CHEST 1 VIEW COMPARISON:  05/28/2016 and earlier. FINDINGS: Portable AP upright view at 0621 hours. Right IJ central line removed. Right lung base chest tube remains. No pneumothorax identified. Less veiling opacity at the right lung base, but more pleural fluid in the right minor fissure today. Dense retrocardiac opacity persists. Stable cardiac size and mediastinal contours. Visualized tracheal air column is within normal limits. Stable to mildly decreased bilateral pulmonary interstitial opacity. IMPRESSION: 1. Right IJ central line removed. Right chest tube remains. No pneumothorax. 2. Less pleural effusion evident at the right lung base but more pleural fluid in the right minor fissure today. 3. Bilateral lower lobe collapse or consolidation. Electronically Signed   By: Genevie Ann M.D.   On: 05/29/2016 08:47   Dg Chest Port 1 View  Result Date: 05/28/2016 CLINICAL DATA:  Shortness of breath.  Chest tube present EXAM: PORTABLE CHEST 1 VIEW COMPARISON:  May 27, 2016 FINDINGS: Central catheter tip is in the superior vena cava. There is a chest tube on the right inferiorly with the tip directed medially, stable. No evident pneumothorax. There are bilateral pleural effusions with diffuse interstitial and patchy alveolar opacity, likely due to edema. There is slightly there is mild consolidation in the medial bases bilaterally. There is a small calcified granuloma in the right upper lobe, stable. Heart is enlarged with mild pulmonary venous hypertension. There is  atherosclerotic calcification in the aorta. No adenopathy. No bone lesions. IMPRESSION: Tube and catheter positions without evident pneumothorax. Findings most consistent with congestive heart failure. There may be superimposed pneumonia, particularly in the bases bilaterally. Both pneumonia and congestive heart failure may present concurrently. No new opacity evident. Stable cardiac silhouette. There is aortic atherosclerosis. Small granuloma right upper lobe, stable. Electronically Signed   By: Lowella Grip III M.D.   On: 05/28/2016 08:51   Dg Chest Port 1 View  Result Date: 05/27/2016 CLINICAL DATA:  71 year old male with history of pneumonia and shortness of breath. Chest tube. EXAM: PORTABLE CHEST 1 VIEW COMPARISON:  Chest x-ray 05/26/2016. FINDINGS: There is a right-sided internal jugular central venous catheter with tip terminating in the distal superior vena cava. Right-sided chest tube in position with tip in the medial aspect of the lower right hemithorax. No appreciable pneumothorax. Lung volumes are normal. Mild emphysematous changes. Patchy multifocal interstitial and airspace disease throughout the lungs bilaterally (left greater than right). Small bilateral pleural effusions (right greater than left). Heart size is borderline enlarged. The patient is rotated to the left on today's exam, resulting in distortion of the mediastinal contours and reduced diagnostic sensitivity and specificity for mediastinal pathology. Aortic atherosclerosis. IMPRESSION: 1. Support apparatus, as above. 2. Patchy multifocal interstitial and airspace disease bilaterally is favored to reflect pulmonary edema, however, multifocal bronchopneumonia could have a similar appearance. 3. Emphysema. 4. Aortic atherosclerosis. Electronically Signed   By: Vinnie Langton M.D.   On: 05/27/2016 08:06   Dg Chest Port 1 View  Result Date: 05/26/2016 CLINICAL DATA:  Empyema.  Shortness breath. EXAM: PORTABLE CHEST 1 VIEW  COMPARISON:  Yesterday. FINDINGS: Right jugular catheter tip in the superior vena cava. Stable enlarged cardiac silhouette and bilateral interstitial prominence. The rounded opacity seen on the right previously is no longer demonstrated. A small calcified granuloma in the right upper lung zone is stable. Dense left lower lobe airspace opacity is unchanged. Small bilateral pleural effusions are again demonstrated. IMPRESSION: Stable cardiomegaly, dense left lower lobe atelectasis or pneumonia, small bilateral pleural effusions and interstitial lung disease, including probable pulmonary edema. Electronically Signed   By: Claudie Revering M.D.   On: 05/26/2016 07:20   Dg Chest Port 1 View  Result Date: 05/25/2016 CLINICAL DATA:  Chest tube in place EXAM: PORTABLE CHEST 1 VIEW COMPARISON:  May 24, 2016 FINDINGS: No pneumothorax. Pleural thickening in the right apex is unchanged. A right chest tube remains in place. Small effusions are stable. The cardiomediastinal silhouette is unchanged. Diffuse bilateral pulmonary opacities with more focal opacity in the left base, similar in the interval. There has been mild improvement in the right base but there is new rounded opacity over the lateral right mid lung. IMPRESSION: 1. Stable support apparatus. 2. New rounded opacity over the right lateral mid lung. Recommend attention on follow-up. 3. Mild improvement in the right basilar opacity with stable focal opacity in the left base. Diffuse interstitial opacities otherwise are unchanged. Electronically Signed   By: Dorise Bullion  III M.D   On: 05/25/2016 07:53   Dg Chest Port 1 View  Result Date: 05/24/2016 CLINICAL DATA:  Shortness of breath today.  Right chest tube. EXAM: PORTABLE CHEST 1 VIEW COMPARISON:  05/23/2016 and previous FINDINGS: Right internal jugular central line tip is in the SVC above the right atrium. Right chest tube is in the inferior pleural space as seen yesterday. No pneumothorax. Enlarged cardiac  silhouette again demonstrated. Slowly diminishing diffuse pulmonary density consistent with improving edema and/or pneumonia. No worsening or new finding. IMPRESSION: Slow radiographic improvement of diffuse lung density. No pneumothorax with right chest tube unchanged at the inferior pleural space. Electronically Signed   By: Nelson Chimes M.D.   On: 05/24/2016 07:30   Dg Chest Port 1 View  Result Date: 05/23/2016 CLINICAL DATA:  Shortness of Breath EXAM: PORTABLE CHEST 1 VIEW COMPARISON:  05/22/2016 FINDINGS: Cardiac shadow remains enlarged. Diffuse airspace disease is again identified and stable. One of the right chest tubes has been removed. No recurrent pneumothorax is seen. Persistent basilar right chest tube is noted. No bony abnormality is seen. IMPRESSION: Stable bilateral airspace without significant change. No pneumothorax following right chest tube removal. Electronically Signed   By: Inez Catalina M.D.   On: 05/23/2016 07:15   Dg Chest Port 1 View  Result Date: 05/22/2016 CLINICAL DATA:  Chest 2, followup EXAM: PORTABLE CHEST 1 VIEW COMPARISON:  Portable chest x-ray of 05/21/2016 FINDINGS: There is little change to perhaps minimal improvement in diffuse airspace disease. Also there appear to be bilateral pleural effusions present. Right chest tube noted and no definite pneumothorax is seen. Mild cardiomegaly is stable. Right IJ central venous line tip overlies the mid lower SVC. IMPRESSION: 1. Little change to minimal improvement in diffuse airspace disease. 2. Two right chest tubes remain.  No pneumothorax. 3. Probable bilateral effusions. Electronically Signed   By: Ivar Drape M.D.   On: 05/22/2016 08:34   Dg Chest Port 1 View  Result Date: 05/21/2016 CLINICAL DATA:  Status post VATS yesterday for empyema drainage EXAM: PORTABLE CHEST 1 VIEW COMPARISON:  Portable chest x-ray of May 20, 2016 FINDINGS: The lungs remain well-expanded. The interstitial markings are diffusely increased and  more conspicuous today. The left hemidiaphragm remains obscured. The right hemidiaphragm extends below the inferior margin of the image. There is no pneumothorax. Two right-sided chest tubes remain and are in stable position. The cardiac silhouette is mildly enlarged. The pulmonary vascularity is engorged and indistinct. There is calcification in the wall of the aortic arch. The right internal jugular venous catheter tip projects over the midportion of the SVC. IMPRESSION: Mild interval worsening in the appearance of the pulmonary interstitium bilaterally. This may reflect worsening pulmonary edema or interstitial pneumonia. No pneumothorax nor significant pleural effusion. The support tubes are in stable position. Thoracic aortic atherosclerosis. Electronically Signed   By: David  Martinique M.D.   On: 05/21/2016 07:06   Dg Chest Port 1 View  Result Date: 05/20/2016 CLINICAL DATA:  Status post VATS and drainage of loculated right pleural effusion/empyema. EXAM: PORTABLE CHEST 1 VIEW COMPARISON:  CT of the chest on 05/17/2016 FINDINGS: Angled right-sided basilar chest tube and additional straight chest tube are present. There is improved aeration of the right lung after drainage of right pleural fluid. Underlying chronic lung disease and interstitial prominence remains bilaterally. No pneumothorax identified. Stable cardiac enlargement. Right jugular central line present with the catheter tip in the SVC. IMPRESSION: Improved aeration of the right lung following VATS and  drainage of right pleural fluid. Angled and straight right-sided chest tubes are present. No visualized pneumothorax. Central line also present with the catheter tip in the SVC. Electronically Signed   By: Aletta Edouard M.D.   On: 05/20/2016 14:05   Dg Chest Port 1 View  Result Date: 05/17/2016 CLINICAL DATA:  Post right thoracentesis EXAM: PORTABLE CHEST 1 VIEW COMPARISON:  05/17/2016 FINDINGS: Cardiomegaly. Again noted mild  congestion/interstitial edema bilaterally. Slight improvement in aeration right lower lung. Persistent small right pleural effusion with right basilar atelectasis or infiltrate. There is no pneumothorax. IMPRESSION: Again noted mild congestion/interstitial edema bilaterally. Slight improvement in aeration right lower lung. Persistent small right pleural effusion with right basilar atelectasis or infiltrate. There is no pneumothorax. Electronically Signed   By: Lahoma Crocker M.D.   On: 05/17/2016 14:50   Dg Chest Port 1 View  Result Date: 05/14/2016 CLINICAL DATA:  Post thoracentesis right EXAM: PORTABLE CHEST 1 VIEW COMPARISON:  05/13/2016 FINDINGS: Interval decrease in right pleural effusion following thoracentesis. No pneumothorax. Prominent interstitial lung markings diffusely most consistent with heart failure and edema. Bilateral effusions and bibasilar atelectasis. Progression of left lower lobe atelectasis since yesterday. IMPRESSION: No pneumothorax post right thoracentesis Diffuse interstitial edema most likely due to heart failure. Bibasilar atelectasis and effusion with progression of left lower lobe atelectasis since yesterday. Electronically Signed   By: Franchot Gallo M.D.   On: 05/14/2016 16:22    Microbiology: No results found for this or any previous visit (from the past 240 hour(s)).   Labs: Basic Metabolic Panel:  Recent Labs Lab 05/27/16 0330 05/28/16 0247 05/29/16 0337 05/30/16 0346 05/31/16 0250  NA 138 139 139  138 140  139 136  K 3.7 3.9 4.1  4.0 4.2  4.1 4.1  CL 102 103 105  103 105  104 100*  CO2 _0 GLUCOSE 88 115* 85  82 106*  103* 126*  BUN 39* 39* 40*  41* 41*  41* 44*  CREATININE 1.14 1.03 0.95  0.98 1.01  0.94 1.09  CALCIUM 8.3* 8.0* 8.1*  8.1* 8.0*  8.0* 8.0*  MG  --  1.8 1.8  --   --   PHOS  --  2.7 2.9 2.8 2.8   Liver Function Tests:  Recent Labs Lab 05/25/16 0500 05/26/16 0500 05/28/16 0247 05/29/16 0337  05/30/16 0346 05/31/16 0250  AST 34 34  --   --  33  --   ALT 28 34  --   --  31  --   ALKPHOS 195* 190*  --   --  177*  --   BILITOT 0.8 0.6  --   --  0.7  --   PROT 5.4* 5.2*  --   --  4.5*  --   ALBUMIN 2.2* 2.2* 1.9* 2.0* 1.9*  1.8* 2.0*   No results for input(s): LIPASE, AMYLASE in the last 168 hours. No results for input(s): AMMONIA in the last 168 hours. CBC:  Recent Labs Lab 05/25/16 0500 05/26/16 0500 05/27/16 0330 05/28/16 0247 05/29/16 0337  WBC 12.5* 15.4* 13.8* 13.4* 12.0*  NEUTROABS  --   --   --  8.9* 7.4  HGB 10.8* 11.4* 10.7* 10.5* 11.0*  HCT 32.7* 34.2* 32.5* 31.7* 33.8*  MCV 91.9 90.7 91.0 91.4 92.1  PLT 384 419* 415* 438* 436*   Cardiac Enzymes: No results for input(s): CKTOTAL, CKMB, CKMBINDEX, TROPONINI in the last 168 hours. BNP: BNP (last 3  results)  Recent Labs  05/13/16 2259 05/14/16 0347  BNP 313.7* 270.7*    ProBNP (last 3 results) No results for input(s): PROBNP in the last 8760 hours.  CBG: No results for input(s): GLUCAP in the last 168 hours.     SignedNita Sells MD   [facility] 05/31/2016, 11:23 AM

## 2016-05-31 NOTE — Telephone Encounter (Signed)
Drew @ [facility] is the contact person for patient's home health visits after hospital discharge [phone]

## 2016-06-01 ENCOUNTER — Encounter: Payer: Self-pay | Admitting: Thoracic Surgery (Cardiothoracic Vascular Surgery)

## 2016-06-01 ENCOUNTER — Telehealth: Payer: Self-pay | Admitting: Thoracic Surgery (Cardiothoracic Vascular Surgery)

## 2016-06-01 DIAGNOSIS — Z5181 Encounter for therapeutic drug level monitoring: Secondary | ICD-10-CM | POA: Diagnosis not present

## 2016-06-01 DIAGNOSIS — J9601 Acute respiratory failure with hypoxia: Secondary | ICD-10-CM | POA: Diagnosis not present

## 2016-06-01 NOTE — Telephone Encounter (Signed)
Patient's wife called regarding pain medication.  Patient was discharged from the hospital yesterday from the [facility] service and patient's wife claimed that they did not receive a prescription for pain medication.  Percocet was listed on medications on discharge summary.  Patient's wife was instructed to go to nurses station of ward where patient was in the hospital tomorrow morning and obtain prescription from discharging physician.  Rexene Alberts, MD 06/01/2016 1:53 AM

## 2016-06-03 ENCOUNTER — Other Ambulatory Visit: Payer: Self-pay | Admitting: Cardiothoracic Surgery

## 2016-06-03 DIAGNOSIS — R918 Other nonspecific abnormal finding of lung field: Secondary | ICD-10-CM

## 2016-06-03 DIAGNOSIS — J9601 Acute respiratory failure with hypoxia: Secondary | ICD-10-CM | POA: Diagnosis not present

## 2016-06-03 DIAGNOSIS — Z5181 Encounter for therapeutic drug level monitoring: Secondary | ICD-10-CM | POA: Diagnosis not present

## 2016-06-03 NOTE — Progress Notes (Signed)
Spoke with  Tommi Rumps at [place] and Confirmed that [facility] services had started under [facility] on Sat. 06/01/16- Pt was placed under [facility] [place] per DR. Reynaldo Minium- due to having difficulty finding a [place] agency to accept and pt was discharging with a Chest tube after prolonged hospital stay.

## 2016-06-04 DIAGNOSIS — S81801A Unspecified open wound, right lower leg, initial encounter: Secondary | ICD-10-CM | POA: Diagnosis not present

## 2016-06-05 ENCOUNTER — Encounter: Payer: Self-pay | Admitting: Cardiothoracic Surgery

## 2016-06-05 ENCOUNTER — Ambulatory Visit
Admission: RE | Admit: 2016-06-05 | Discharge: 2016-06-05 | Disposition: A | Discharge status: Home / Self Care | Payer: Medicare Other | Source: Ambulatory Visit | Attending: Cardiothoracic Surgery | Admitting: Cardiothoracic Surgery

## 2016-06-05 ENCOUNTER — Encounter: Payer: Self-pay | Admitting: *Deleted

## 2016-06-05 ENCOUNTER — Ambulatory Visit (INDEPENDENT_AMBULATORY_CARE_PROVIDER_SITE_OTHER): Payer: Self-pay | Admitting: Cardiothoracic Surgery

## 2016-06-05 VITALS — BP 98/62 | HR 84 | Resp 16 | Ht 71.0 in | Wt 155.6 lb

## 2016-06-05 DIAGNOSIS — Z09 Encounter for follow-up examination after completed treatment for conditions other than malignant neoplasm: Secondary | ICD-10-CM

## 2016-06-05 DIAGNOSIS — R918 Other nonspecific abnormal finding of lung field: Secondary | ICD-10-CM

## 2016-06-05 DIAGNOSIS — J869 Pyothorax without fistula: Secondary | ICD-10-CM

## 2016-06-05 DIAGNOSIS — Z978 Presence of other specified devices: Secondary | ICD-10-CM

## 2016-06-05 DIAGNOSIS — Z9689 Presence of other specified functional implants: Secondary | ICD-10-CM

## 2016-06-05 NOTE — Progress Notes (Signed)
During his scheduled post op visit today, Mrs. Bordon said that they were unable to get the medication, Sildenafil, that was prescribed by the hospitalist on discharge.  Medicare denied it and it was too expensive for them to purchase. I called [facility] to see if they had any samples but they didn't. I consulted with Dr. Prescott Gum and he said he could omit it until he was seen by cardiology on 06/14/16 and they could re-evaluate him. I called his wife and relayed this message.

## 2016-06-05 NOTE — Progress Notes (Signed)
PCP is [name] Referring Provider is Juanito Doom, MD  Chief Complaint  Patient presents with  . Routine Post Op    s/p R VATS for EMPYEMA 05/20/16 with a CXR...atrium express in place    HPI: [facility] visit Patient has right empyema tube connected to mini express Drainage less than 60 cc per day Chest x-ray shows resolution of right lower lobe empyema, no significant effusion Chest tube removed in office today without difficulty Excellent breath sounds present post tube removal Patient continues with poor nutritional intake, weight now 155 with bilateral pedal edema despite Lasix and Aldactone Patient has not smoked since discharge  Past Medical History:  Diagnosis Date  . Arthritis    "neck" (08/15/2015)  . Chronic bronchitis ([place])   . Constipation   . COPD (chronic obstructive pulmonary disease) ([place])   . DDD (degenerative disc disease), cervical   . Edema of left lower extremity   . Facial basal cell cancer   . H/O acne vulgaris 1960s   "led to my discharge from the [facility] in the mid 1960s"  . Headache    history of - left temporal- years ago- not current (08/15/2015)  . Pneumonia 1960s; 2015 X 2  . Prostate cancer ([place]) dx'd early 2000s   "low spreading; non aggressive type" (08/15/2015)  . Skin cancer    "back"    Past Surgical History:  Procedure Laterality Date  . COLONOSCOPY    . EXCISIONAL HEMORRHOIDECTOMY  1960s  . INGUINAL HERNIA REPAIR Right 2002  . INGUINAL HERNIA REPAIR Bilateral 08/15/2015  . INGUINAL HERNIA REPAIR Bilateral 08/15/2015   Procedure: OPEN REPAIR RECURRENT RIGHT INGUINAL HERNIA WITH MESH AND REPAIR LEFT INGUINAL HERNIA WITH MESH;  Surgeon: [name], MD;  Location: [facility];  Service: General;  Laterality: Bilateral;  . INSERTION OF MESH Bilateral 08/15/2015   Procedure: INSERTION OF MESH;  Surgeon: [name], MD;  Location: [facility];  Service: General;  Laterality: Bilateral;  . PROSTATE BIOPSY    . TONSILLECTOMY  1950s  .  VIDEO ASSISTED THORACOSCOPY (VATS)/EMPYEMA Right 05/20/2016   Procedure: VIDEO ASSISTED THORACOSCOPY (VATS) with drainage of pleural effusion;  Surgeon: [name], MD;  Location: [facility];  Service: Thoracic;  Laterality: Right;  [name] VIDEO BRONCHOSCOPY WITH ENDOBRONCHIAL ULTRASOUND Right 05/20/2016   Procedure: VIDEO BRONCHOSCOPY WITH ENDOBRONCHIAL ULTRASOUND;  Surgeon: [name], MD;  Location: [facility];  Service: Thoracic;  Laterality: Right;    Family History  Problem Relation Age of Onset  . Alcoholism Father   . CAD Father   . Ovarian cancer Sister   . Diabetes Neg Hx     Social History Social History  Substance Use Topics  . Smoking status: Current Every Day Smoker    Packs/day: 1.00    Years: 62.00    Types: Cigarettes  . Smokeless tobacco: Never Used  . Alcohol use No    Current Outpatient Prescriptions  Medication Sig Dispense Refill  . amoxicillin-clavulanate (AUGMENTIN) 875-125 MG tablet Take 1 tablet by mouth every 12 (twelve) hours. 14 tablet 0  . Ascorbic Acid (VITAMIN C) 1000 MG tablet Take 1,000 mg by mouth 2 (two) times daily.    [name] aspirin 81 MG tablet Take 81 mg by mouth daily. Reported on 05/08/2015    . fluocinonide-emollient (LIDEX-E) 0.05 % cream Apply 1 application topically daily as needed (for skin).     . furosemide (LASIX) 40 MG tablet Take 1 tablet (40 mg total) by mouth daily.  30 tablet 0  . nicotine (NICODERM CQ - DOSED IN MG/24 HR) 7 mg/24hr patch Place 1 patch (7 mg total) onto the skin daily. 28 patch 0  . pantoprazole (PROTONIX) 40 MG tablet Take 1 tablet (40 mg total) by mouth daily. 30 tablet 0  . senna-docusate (SENOKOT-S) 8.6-50 MG tablet Take 1 tablet by mouth at bedtime as needed for mild constipation. 30 tablet 0  . spironolactone (ALDACTONE) 25 MG tablet Take 1 tablet (25 mg total) by mouth daily. 30 tablet 0  . warfarin (COUMADIN) 4 MG tablet Take 1 tablet (4 mg total) by mouth daily at 6 PM. 7 tablet 0  . oxyCODONE-acetaminophen  (PERCOCET/ROXICET) 5-325 MG tablet Take 1-2 tablets by mouth every 4 (four) hours as needed for moderate pain. (Patient not taking: Reported on 05/13/2016) 30 tablet 0  . sildenafil (REVATIO) 20 MG tablet Take 1 tablet (20 mg total) by mouth 2 (two) times daily. (Patient not taking: Reported on 06/05/2016) 60 tablet 0  . tamsulosin (FLOMAX) 0.4 MG CAPS capsule Take 1 capsule (0.4 mg total) by mouth daily after breakfast. (Patient not taking: Reported on 05/13/2016) 30 capsule 1   No current facility-administered medications for this visit.     Allergies  Allergen Reactions  . Demerol [Meperidine] Other (See Comments)    UNSPECIFIED REACTION  Causes system to shutdown. ?     Review of Systems  No fever No productive cough Chest tube drainage is now minimal Difficulty sleeping Poor appetite  BP 98/62 (BP Location: Left Arm, Patient Position: Sitting, Cuff Size: Normal)   Pulse 84   Resp 16   Ht _0  (1.803 m)   Wt 155 lb 9.6 oz (70.6 kg)   SpO2 98%   BMI 21.70 kg/m  Physical Exam Patient appears gaunt chronically ill malnourished Lungs clear Right thoracotomy incision well-healed Old chest tube stitch removed, gauze dressing over current chest tube site intact, dry Significant ankle edema bilaterally Diagnostic Tests: Chest x-ray clear  Impression: Patient will finish his course of oral Augmentin then stop He'll be able to take a shower in 24 hours He is encouraged to increase protein intake with inshore boost or protein shakes  Plan: Return with chest x-ray in one week  Len Childs, MD [facility] [phone]

## 2016-06-06 DIAGNOSIS — J9601 Acute respiratory failure with hypoxia: Secondary | ICD-10-CM | POA: Diagnosis not present

## 2016-06-06 DIAGNOSIS — Z5181 Encounter for therapeutic drug level monitoring: Secondary | ICD-10-CM | POA: Diagnosis not present

## 2016-06-08 ENCOUNTER — Emergency Department (HOSPITAL_COMMUNITY)
Admission: EM | Admit: 2016-06-08 | Discharge: 2016-06-09 | Disposition: A | Discharge status: Home / Self Care | Payer: Medicare Other | Attending: Emergency Medicine | Admitting: Emergency Medicine

## 2016-06-08 ENCOUNTER — Encounter (HOSPITAL_COMMUNITY): Payer: Self-pay

## 2016-06-08 DIAGNOSIS — Z5181 Encounter for therapeutic drug level monitoring: Secondary | ICD-10-CM | POA: Diagnosis not present

## 2016-06-08 DIAGNOSIS — Z8546 Personal history of malignant neoplasm of prostate: Secondary | ICD-10-CM | POA: Diagnosis not present

## 2016-06-08 DIAGNOSIS — Z85828 Personal history of other malignant neoplasm of skin: Secondary | ICD-10-CM | POA: Diagnosis not present

## 2016-06-08 DIAGNOSIS — Z79899 Other long term (current) drug therapy: Secondary | ICD-10-CM | POA: Diagnosis not present

## 2016-06-08 DIAGNOSIS — Y69 Unspecified misadventure during surgical and medical care: Secondary | ICD-10-CM | POA: Insufficient documentation

## 2016-06-08 DIAGNOSIS — F17210 Nicotine dependence, cigarettes, uncomplicated: Secondary | ICD-10-CM | POA: Diagnosis not present

## 2016-06-08 DIAGNOSIS — R252 Cramp and spasm: Secondary | ICD-10-CM | POA: Diagnosis not present

## 2016-06-08 DIAGNOSIS — Z7982 Long term (current) use of aspirin: Secondary | ICD-10-CM | POA: Insufficient documentation

## 2016-06-08 DIAGNOSIS — T672XXA Heat cramp, initial encounter: Secondary | ICD-10-CM | POA: Diagnosis not present

## 2016-06-08 DIAGNOSIS — J9601 Acute respiratory failure with hypoxia: Secondary | ICD-10-CM | POA: Diagnosis not present

## 2016-06-08 DIAGNOSIS — Z7901 Long term (current) use of anticoagulants: Secondary | ICD-10-CM | POA: Insufficient documentation

## 2016-06-08 DIAGNOSIS — T402X5A Adverse effect of other opioids, initial encounter: Secondary | ICD-10-CM | POA: Insufficient documentation

## 2016-06-08 DIAGNOSIS — J449 Chronic obstructive pulmonary disease, unspecified: Secondary | ICD-10-CM | POA: Diagnosis not present

## 2016-06-08 DIAGNOSIS — T887XXA Unspecified adverse effect of drug or medicament, initial encounter: Secondary | ICD-10-CM | POA: Insufficient documentation

## 2016-06-08 DIAGNOSIS — T50905A Adverse effect of unspecified drugs, medicaments and biological substances, initial encounter: Secondary | ICD-10-CM

## 2016-06-08 LAB — COMPREHENSIVE METABOLIC PANEL
ALBUMIN: 2.5 g/dL — AB (ref 3.5–5.0)
ALK PHOS: 217 U/L — AB (ref 38–126)
ALT: 29 U/L (ref 17–63)
AST: 30 U/L (ref 15–41)
Anion gap: 6 (ref 5–15)
BUN: 48 mg/dL — ABNORMAL HIGH (ref 6–20)
CALCIUM: 8.4 mg/dL — AB (ref 8.9–10.3)
CHLORIDE: 96 mmol/L — AB (ref 101–111)
CO2: 28 mmol/L (ref 22–32)
CREATININE: 1.1 mg/dL (ref 0.61–1.24)
GFR calc Af Amer: >60 mL/min (ref >60)
GFR calc non Af Amer: >60 mL/min (ref >60)
GLUCOSE: 109 mg/dL — AB (ref 65–99)
Potassium: 4.7 mmol/L (ref 3.5–5.1)
SODIUM: 130 mmol/L — AB (ref 135–145)
Total Bilirubin: 0.3 mg/dL (ref 0.3–1.2)
Total Protein: 5.9 g/dL — ABNORMAL LOW (ref 6.5–8.1)

## 2016-06-08 LAB — URINALYSIS, ROUTINE W REFLEX MICROSCOPIC
BILIRUBIN URINE: NEGATIVE
Bacteria, UA: NONE SEEN
Glucose, UA: NEGATIVE mg/dL
Hgb urine dipstick: NEGATIVE
KETONES UR: NEGATIVE mg/dL
Leukocytes, UA: NEGATIVE
Nitrite: NEGATIVE
PH: 6 (ref 5.0–8.0)
Protein, ur: 100 mg/dL — AB
Specific Gravity, Urine: 1.006 (ref 1.005–1.030)
Squamous Epithelial / HPF: NONE SEEN

## 2016-06-08 LAB — CBC
HCT: 32 % — ABNORMAL LOW (ref 39.0–52.0)
Hemoglobin: 10.8 g/dL — ABNORMAL LOW (ref 13.0–17.0)
MCH: 30.2 pg (ref 26.0–34.0)
MCHC: 33.8 g/dL (ref 30.0–36.0)
MCV: 89.4 fL (ref 78.0–100.0)
PLATELETS: 383 10*3/uL (ref 150–400)
RBC: 3.58 MIL/uL — ABNORMAL LOW (ref 4.22–5.81)
RDW: 15.3 % (ref 11.5–15.5)
WBC: 10.3 10*3/uL (ref 4.0–10.5)

## 2016-06-08 MED ORDER — METHOCARBAMOL 500 MG PO TABS: 500.0000 mg | ORAL_TABLET | Freq: Three times a day (TID) | ORAL | 0 refills | Status: DC | PRN

## 2016-06-08 NOTE — ED Provider Notes (Signed)
[facility] DEPT Provider Note   CSN: [id number] Arrival date & time: 06/08/16  1758     History   Chief Complaint Chief Complaint  Patient presents with  . Hand Cramps    HPI [name] is a 71 y.o. male.  HPI Patient was recently hospitalized for empyema. Started taking oxycodone yesterday and became hyperactive with insomnia. States he spent 4 hours cleaning the kitchen. He has had bilateral hand and forearm spasms today. These lasts several minutes. Wife states they occur every 2 hours. He has no associated tingling or numbness. No weakness. States he's had any spasms for the past 4 hours. Denies any fever or chills. He's been drinking plenty fluids and eating bananas to replace potassium. Past Medical History:  Diagnosis Date  . Arthritis    "neck" (08/15/2015)  . Chronic bronchitis ([place])   . Constipation   . COPD (chronic obstructive pulmonary disease) ([place])   . DDD (degenerative disc disease), cervical   . Edema of left lower extremity   . Facial basal cell cancer   . H/O acne vulgaris 1960s   "led to my discharge from the [facility] in the mid 1960s"  . Headache    history of - left temporal- years ago- not current (08/15/2015)  . Pneumonia 1960s; 2015 X 2  . Prostate cancer ([place]) dx'd early 2000s   "low spreading; non aggressive type" (08/15/2015)  . Skin cancer    "back"    Patient Active Problem List   Diagnosis Date Noted  . Septic shock ([place])   . Bilateral lower extremity edema   . Atrial fibrillation ([place])   . Acute respiratory failure with hypoxia ([place]) 05/21/2016  . Lung mass 05/21/2016  . Loculated pleural effusion 05/21/2016  . Parapneumonic effusion 05/21/2016  . Empyema ([place]) 05/21/2016  . S/P thoracentesis   . Bilateral pleural effusion 05/14/2016  . COPD (chronic obstructive pulmonary disease) ([place]) 05/14/2016  . Tobacco abuse 05/14/2016  . Protein-calorie malnutrition, severe ([place]) 05/14/2016    Past Surgical History:  Procedure  Laterality Date  . COLONOSCOPY    . EXCISIONAL HEMORRHOIDECTOMY  1960s  . INGUINAL HERNIA REPAIR Right 2002  . INGUINAL HERNIA REPAIR Bilateral 08/15/2015  . INGUINAL HERNIA REPAIR Bilateral 08/15/2015   Procedure: OPEN REPAIR RECURRENT RIGHT INGUINAL HERNIA WITH MESH AND REPAIR LEFT INGUINAL HERNIA WITH MESH;  Surgeon: [name], MD;  Location: [facility];  Service: General;  Laterality: Bilateral;  . INSERTION OF MESH Bilateral 08/15/2015   Procedure: INSERTION OF MESH;  Surgeon: [name], MD;  Location: [facility];  Service: General;  Laterality: Bilateral;  . PROSTATE BIOPSY    . TONSILLECTOMY  1950s  . VIDEO ASSISTED THORACOSCOPY (VATS)/EMPYEMA Right 05/20/2016   Procedure: VIDEO ASSISTED THORACOSCOPY (VATS) with drainage of pleural effusion;  Surgeon: [name], MD;  Location: [facility];  Service: Thoracic;  Laterality: Right;  [name] VIDEO BRONCHOSCOPY WITH ENDOBRONCHIAL ULTRASOUND Right 05/20/2016   Procedure: VIDEO BRONCHOSCOPY WITH ENDOBRONCHIAL ULTRASOUND;  Surgeon: [name], MD;  Location: [facility];  Service: Thoracic;  Laterality: Right;       Home Medications    Prior to Admission medications   Medication Sig Start Date End Date Taking? Authorizing Provider  Ascorbic Acid (VITAMIN C) 1000 MG tablet Take 1,000 mg by mouth 2 (two) times daily.   Yes Historical Provider, MD  aspirin 81 MG tablet Take 81 mg by mouth daily. Reported on 05/08/2015   Yes Historical Provider, MD  furosemide (LASIX) 40 MG tablet  Take 1 tablet (40 mg total) by mouth daily. 06/01/16  Yes Nita Sells, MD  oxyCODONE (OXY IR/ROXICODONE) 5 MG immediate release tablet Take 5 mg by mouth at bedtime.  06/05/16  Yes Historical Provider, MD  pantoprazole (PROTONIX) 40 MG tablet Take 1 tablet (40 mg total) by mouth daily. 06/01/16  Yes Nita Sells, MD  senna-docusate (SENOKOT-S) 8.6-50 MG tablet Take 1 tablet by mouth at bedtime as needed for mild constipation. 05/31/16  Yes Nita Sells, MD    spironolactone (ALDACTONE) 25 MG tablet Take 1 tablet (25 mg total) by mouth daily. 06/01/16  Yes Nita Sells, MD  amoxicillin-clavulanate (AUGMENTIN) 875-125 MG tablet Take 1 tablet by mouth every 12 (twelve) hours. Patient not taking: Reported on 06/08/2016 05/31/16   Nita Sells, MD  methocarbamol (ROBAXIN) 500 MG tablet Take 1 tablet (500 mg total) by mouth every 8 (eight) hours as needed for muscle spasms. 06/08/16   Julianne Rice, MD  nicotine (NICODERM CQ - DOSED IN MG/24 HR) 7 mg/24hr patch Place 1 patch (7 mg total) onto the skin daily. 06/01/16   Nita Sells, MD  oxyCODONE-acetaminophen (PERCOCET/ROXICET) 5-325 MG tablet Take 1-2 tablets by mouth every 4 (four) hours as needed for moderate pain. Patient not taking: Reported on 05/13/2016 08/16/15   [name], MD  sildenafil (REVATIO) 20 MG tablet Take 1 tablet (20 mg total) by mouth 2 (two) times daily. Patient not taking: Reported on 06/05/2016 05/31/16   Nita Sells, MD  tamsulosin (FLOMAX) 0.4 MG CAPS capsule Take 1 capsule (0.4 mg total) by mouth daily after breakfast. Patient not taking: Reported on 05/13/2016 08/16/15   [name], MD  warfarin (COUMADIN) 4 MG tablet Take 1 tablet (4 mg total) by mouth daily at 6 PM. Patient not taking: Reported on 06/08/2016 05/31/16   Nita Sells, MD    Family History Family History  Problem Relation Age of Onset  . Alcoholism Father   . CAD Father   . Ovarian cancer Sister   . Diabetes Neg Hx     Social History Social History  Substance Use Topics  . Smoking status: Current Every Day Smoker    Packs/day: 1.00    Years: 62.00    Types: Cigarettes  . Smokeless tobacco: Never Used  . Alcohol use No     Allergies   Demerol [meperidine] and Oxycodone hcl   Review of Systems Review of Systems  Constitutional: Negative for chills and fever.  Respiratory: Negative for cough and shortness of breath.   Cardiovascular: Negative for chest pain.   Gastrointestinal: Negative for abdominal pain, diarrhea, nausea and vomiting.  Genitourinary: Negative for dysuria, flank pain, frequency and hematuria.  Musculoskeletal: Positive for myalgias. Negative for back pain and neck pain.  Skin: Negative for pallor and rash.  Neurological: Negative for dizziness, weakness, light-headedness, numbness and headaches.  Psychiatric/Behavioral: Positive for sleep disturbance. Negative for agitation, confusion, hallucinations, self-injury and suicidal ideas. The patient is hyperactive. The patient is not nervous/anxious.   All other systems reviewed and are negative.    Physical Exam Updated Vital Signs BP 106/62 (BP Location: Right Arm)   Pulse 67   Temp 97.7 F (36.5 C) (Oral)   Resp 20   SpO2 97%   Physical Exam  Constitutional: He is oriented to person, place, and time. He appears well-developed and well-nourished. No distress.  HENT:  Head: Normocephalic and atraumatic.  Mouth/Throat: Oropharynx is clear and moist. No oropharyngeal exudate.  Eyes: EOM are normal. Pupils are equal, round, and  reactive to light.  Neck: Normal range of motion. Neck supple.  Cardiovascular: Normal rate and regular rhythm.  Exam reveals no gallop and no friction rub.   No murmur heard. Pulmonary/Chest: Effort normal and breath sounds normal. No respiratory distress. He has no wheezes. He has no rales.  Abdominal: Soft. Bowel sounds are normal. There is no tenderness. There is no rebound and no guarding.  Musculoskeletal: Normal range of motion. He exhibits no edema or tenderness.  2+ radial and ulnar pulses bilaterally. Patient has good distal cap refill. Negative Tinel's and Phalen's sign. No upper extremity swelling or discoloration.  Neurological: He is alert and oriented to person, place, and time.  5/5 motor in all extremities. Sensation is fully intact.  Skin: Skin is warm and dry. Capillary refill takes less than 2 seconds. No rash noted. No erythema. No  pallor.  Psychiatric: He has a normal mood and affect. His behavior is normal.  Nursing note and vitals reviewed.    ED Treatments / Results  Labs (all labs ordered are listed, but only abnormal results are displayed) Labs Reviewed  COMPREHENSIVE METABOLIC PANEL - Abnormal; Notable for the following:       Result Value   Sodium 130 (*)    Chloride 96 (*)    Glucose, Bld 109 (*)    BUN 48 (*)    Calcium 8.4 (*)    Total Protein 5.9 (*)    Albumin 2.5 (*)    Alkaline Phosphatase 217 (*)    All other components within normal limits  CBC - Abnormal; Notable for the following:    RBC 3.58 (*)    Hemoglobin 10.8 (*)    HCT 32.0 (*)    All other components within normal limits  URINALYSIS, ROUTINE W REFLEX MICROSCOPIC - Abnormal; Notable for the following:    Color, Urine STRAW (*)    Protein, ur 100 (*)    All other components within normal limits    EKG  EKG Interpretation None       Radiology No results found.  Procedures Procedures (including critical care time)  Medications Ordered in ED Medications - No data to display   Initial Impression / Assessment and Plan / ED Course  I have reviewed the triage vital signs and the nursing notes.  Pertinent labs & imaging results that were available during my care of the patient were reviewed by me and considered in my medical decision making (see chart for details).     Patient with bilateral hand spasms and cramps. Possibly related to overuse or medication side effects. Has not had any while in the emergency department. Advised to follow-up with his primary physician and stop taking Percocet. Return precautions given.  Final Clinical Impressions(s) / ED Diagnoses   Final diagnoses:  Hand cramps  Medication reaction, initial encounter    New Prescriptions New Prescriptions   METHOCARBAMOL (ROBAXIN) 500 MG TABLET    Take 1 tablet (500 mg total) by mouth every 8 (eight) hours as needed for muscle spasms.       Julianne Rice, MD 06/08/16 [phone]

## 2016-06-08 NOTE — ED Notes (Addendum)
Pt reports that he had thoracic surgery on the beginning of January, and had chest tubes for removal of fluid out of lungs, and last chest  tube came out Tues.; since yesterday pt has been having  bilateral hand and forearms cramping and pain in arms, that as per pt has been occurring every 2 hours. Pt report 0/10 pain at this moment. Pt denies numbness and tingling.

## 2016-06-08 NOTE — ED Triage Notes (Signed)
Pt c/o intermittent bilateral hand and forearm muscle cramps starting today.  Currently, denies pain.  Pt reports "they just lock up."  Pt reports that he has been has been drinking well and trying to drink smoothies.  Pt reports that he was discharged x 8 days ago from [facility] after being admitted for PNA and needing chest tubes.  Pt takes lasix.    Pt's wife reports Pt cleaned kitchen x 4 hours yesterday.

## 2016-06-08 NOTE — ED Notes (Signed)
Pt has two surgical incisions to rt side of upper back, 1 site stitches are intact, other area is healed/scabbed. No reddness or swelling is noted.

## 2016-06-10 DIAGNOSIS — Z5181 Encounter for therapeutic drug level monitoring: Secondary | ICD-10-CM | POA: Diagnosis not present

## 2016-06-10 DIAGNOSIS — J9601 Acute respiratory failure with hypoxia: Secondary | ICD-10-CM | POA: Diagnosis not present

## 2016-06-10 LAB — CULTURE, FUNGUS WITHOUT SMEAR

## 2016-06-11 ENCOUNTER — Other Ambulatory Visit: Payer: Self-pay | Admitting: Cardiothoracic Surgery

## 2016-06-11 DIAGNOSIS — R918 Other nonspecific abnormal finding of lung field: Secondary | ICD-10-CM

## 2016-06-12 ENCOUNTER — Ambulatory Visit
Admission: RE | Admit: 2016-06-12 | Discharge: 2016-06-12 | Disposition: A | Discharge status: Home / Self Care | Payer: Medicare Other | Source: Ambulatory Visit | Attending: Cardiothoracic Surgery | Admitting: Cardiothoracic Surgery

## 2016-06-12 ENCOUNTER — Ambulatory Visit (INDEPENDENT_AMBULATORY_CARE_PROVIDER_SITE_OTHER): Payer: Self-pay | Admitting: Cardiothoracic Surgery

## 2016-06-12 ENCOUNTER — Other Ambulatory Visit: Payer: Self-pay | Admitting: *Deleted

## 2016-06-12 ENCOUNTER — Encounter: Payer: Self-pay | Admitting: Cardiothoracic Surgery

## 2016-06-12 VITALS — BP 104/72 | HR 79 | Resp 16 | Ht 71.0 in | Wt 155.0 lb

## 2016-06-12 DIAGNOSIS — Z09 Encounter for follow-up examination after completed treatment for conditions other than malignant neoplasm: Secondary | ICD-10-CM

## 2016-06-12 DIAGNOSIS — G8918 Other acute postprocedural pain: Secondary | ICD-10-CM

## 2016-06-12 DIAGNOSIS — J869 Pyothorax without fistula: Secondary | ICD-10-CM

## 2016-06-12 DIAGNOSIS — R918 Other nonspecific abnormal finding of lung field: Secondary | ICD-10-CM

## 2016-06-12 DIAGNOSIS — I4891 Unspecified atrial fibrillation: Secondary | ICD-10-CM

## 2016-06-12 MED ORDER — TRAMADOL HCL 50 MG PO TABS: 50.0000 mg | ORAL_TABLET | Freq: Four times a day (QID) | ORAL | 0 refills | Status: DC | PRN

## 2016-06-12 MED ORDER — WARFARIN SODIUM 4 MG PO TABS: 4.0000 mg | ORAL_TABLET | Freq: Every day | ORAL | 0 refills | Status: DC

## 2016-06-12 NOTE — Progress Notes (Signed)
PCP is [name] Referring Provider is Juanito Doom, MD  Chief Complaint  Patient presents with  . Routine Post Op    1 wk f/u with a CXR...[facility]/chest tube removed at last visit    HPI: Postop visit after right VATS drainage of empyema and decortication right lower lobe and transbronchial biopsy of subcarinal lymph node-i inflammatory. Last visit his right chest tube was removed He has completed his oral antibiotics-Augmentin Appetite is improved strength has improved ankle edema has improved Chest x-ray today shows COPD without reaccumulation of right pleural fluid or pneumothorax He is not smoking He is having pain and wishes to transition from oxycodone to tramadol He has postthoracotomy pain and we will provide him with Voltaren gel for topical use He is ready to resume driving in normal daily activity but no heavy lifting Thoracotomy incision is well-healed and a final chest tube sutures removed in the office today  Past Medical History:  Diagnosis Date  . Arthritis    "neck" (08/15/2015)  . Chronic bronchitis ([place])   . Constipation   . COPD (chronic obstructive pulmonary disease) ([place])   . DDD (degenerative disc disease), cervical   . Edema of left lower extremity   . Facial basal cell cancer   . H/O acne vulgaris 1960s   "led to my discharge from the [facility] in the mid 1960s"  . Headache    history of - left temporal- years ago- not current (08/15/2015)  . Pneumonia 1960s; 2015 X 2  . Prostate cancer ([place]) dx'd early 2000s   "low spreading; non aggressive type" (08/15/2015)  . Skin cancer    "back"    Past Surgical History:  Procedure Laterality Date  . COLONOSCOPY    . EXCISIONAL HEMORRHOIDECTOMY  1960s  . INGUINAL HERNIA REPAIR Right 2002  . INGUINAL HERNIA REPAIR Bilateral 08/15/2015  . INGUINAL HERNIA REPAIR Bilateral 08/15/2015   Procedure: OPEN REPAIR RECURRENT RIGHT INGUINAL HERNIA WITH MESH AND REPAIR LEFT INGUINAL HERNIA WITH MESH;  Surgeon:  [name], MD;  Location: [place];  Service: General;  Laterality: Bilateral;  . INSERTION OF MESH Bilateral 08/15/2015   Procedure: INSERTION OF MESH;  Surgeon: [name], MD;  Location: [place];  Service: General;  Laterality: Bilateral;  . PROSTATE BIOPSY    . TONSILLECTOMY  1950s  . VIDEO ASSISTED THORACOSCOPY (VATS)/EMPYEMA Right 05/20/2016   Procedure: VIDEO ASSISTED THORACOSCOPY (VATS) with drainage of pleural effusion;  Surgeon: [name], MD;  Location: [place];  Service: Thoracic;  Laterality: Right;  [name] VIDEO BRONCHOSCOPY WITH ENDOBRONCHIAL ULTRASOUND Right 05/20/2016   Procedure: VIDEO BRONCHOSCOPY WITH ENDOBRONCHIAL ULTRASOUND;  Surgeon: [name], MD;  Location: [facility] OR;  Service: Thoracic;  Laterality: Right;    Family History  Problem Relation Age of Onset  . Alcoholism Father   . CAD Father   . Ovarian cancer Sister   . Diabetes Neg Hx     Social History Social History  Substance Use Topics  . Smoking status: Current Every Day Smoker    Packs/day: 1.00    Years: 62.00    Types: Cigarettes  . Smokeless tobacco: Never Used  . Alcohol use No    Current Outpatient Prescriptions  Medication Sig Dispense Refill  . Ascorbic Acid (VITAMIN C) 1000 MG tablet Take 1,000 mg by mouth 2 (two) times daily.    [name] aspirin 81 MG tablet Take 81 mg by mouth daily. Reported on 05/08/2015    . furosemide (LASIX) 40  MG tablet Take 1 tablet (40 mg total) by mouth daily. 30 tablet 0  . methocarbamol (ROBAXIN) 500 MG tablet Take 1 tablet (500 mg total) by mouth every 8 (eight) hours as needed for muscle spasms. 20 tablet 0  . pantoprazole (PROTONIX) 40 MG tablet Take 1 tablet (40 mg total) by mouth daily. 30 tablet 0  . senna-docusate (SENOKOT-S) 8.6-50 MG tablet Take 1 tablet by mouth at bedtime as needed for mild constipation. 30 tablet 0  . spironolactone (ALDACTONE) 25 MG tablet Take 1 tablet (25 mg total) by mouth daily. 30 tablet 0  . traMADol (ULTRAM) 50 MG tablet Take 1  tablet (50 mg total) by mouth every 6 (six) hours as needed. 30 tablet 0  . warfarin (COUMADIN) 4 MG tablet Take 1 tablet (4 mg total) by mouth daily at 6 PM. 7 tablet 0   No current facility-administered medications for this visit.     Allergies  Allergen Reactions  . Demerol [Meperidine] Other (See Comments)    UNSPECIFIED REACTION  Causes system to shutdown. ?   . Oxycodone Hcl Other (See Comments)    Pt states this medication 'wires him up' and makes pt hyper; pt does not want to take this ever again    Review of Systems  He is not smoking He denies fever Appetite and strength are improving He sleeps in a recliner because of postthoracotomy pain  BP 104/72 (BP Location: Left Arm, Patient Position: Sitting, Cuff Size: Normal)   Pulse 79   Resp 16   Ht _0  (1.803 m)   Wt 155 lb (70.3 kg)   SpO2 97% Comment: ON RA  BMI 21.62 kg/m  Physical Exam Chronically ill-appearing Caucasian male no acute distress Breath sounds clear and equal Heart rate irregular-patient has chronic A. fib on Coumadin Thoracotomy incision clean and dry 1-2+ bilateral ankle edema Neuro intact  Diagnostic Tests: Chest x-ray clear no recurrent effusion  Impression: Doing well, continue current activities and nutritional augmentation with protein shakes etc. Reduce Lasix to Monday Wednesday Friday Prescriptions provided for tramadol and Voltaren gel Continue Coumadin-patient has a appointment to be seen in the cardiology-A. fib  clinic Plan: Return with chest x-ray in 4 weeks  Len Childs, MD [facility] [phone]

## 2016-06-13 DIAGNOSIS — Z5181 Encounter for therapeutic drug level monitoring: Secondary | ICD-10-CM | POA: Diagnosis not present

## 2016-06-13 DIAGNOSIS — J9601 Acute respiratory failure with hypoxia: Secondary | ICD-10-CM | POA: Diagnosis not present

## 2016-06-14 ENCOUNTER — Other Ambulatory Visit: Payer: Self-pay

## 2016-06-14 ENCOUNTER — Encounter: Payer: Self-pay | Admitting: Cardiology

## 2016-06-14 ENCOUNTER — Ambulatory Visit (INDEPENDENT_AMBULATORY_CARE_PROVIDER_SITE_OTHER): Payer: Medicare Other | Admitting: Cardiology

## 2016-06-14 ENCOUNTER — Ambulatory Visit (INDEPENDENT_AMBULATORY_CARE_PROVIDER_SITE_OTHER): Payer: Medicare Other | Admitting: *Deleted

## 2016-06-14 VITALS — BP 98/56 | HR 76 | Ht 71.0 in | Wt 156.4 lb

## 2016-06-14 DIAGNOSIS — I4891 Unspecified atrial fibrillation: Secondary | ICD-10-CM

## 2016-06-14 DIAGNOSIS — Z5181 Encounter for therapeutic drug level monitoring: Secondary | ICD-10-CM | POA: Diagnosis not present

## 2016-06-14 LAB — POCT INR: INR: 1.1

## 2016-06-14 MED ORDER — OSELTAMIVIR PHOSPHATE 75 MG PO CAPS: 75.0000 mg | ORAL_CAPSULE | Freq: Every day | ORAL | 0 refills | Status: DC

## 2016-06-14 MED ORDER — WARFARIN SODIUM 4 MG PO TABS: 4.0000 mg | ORAL_TABLET | Freq: Every day | ORAL | 1 refills | Status: DC

## 2016-06-14 NOTE — Progress Notes (Signed)
06/14/2016 [name]   Apr 08, 1946  [id number]  Primary Physician [name] Primary Cardiologist: Dr. Radford Pax   Reason for Visit/CC: [facility] f/u for Atrial Fibrillation and Diastolic HF, in the setting of PNA and right empyema.   HPI:   [name] is a 71 yo male who presents to clinic today for post hospital f/u. He has a past medical history of COPD, Smoking, prostate cancer, Pneumonia 1 year ago and inguinal hernia S/P repair who presented  on 05/13/2016 with a 2 week history of progressive shortness of breath treated as outpt with Levaquin and no improvement. He has no known cardiac history and no previous cardiac studies or procedures. On admit, Chest CT showed no PE but large right pleural effusion with PNA. BNP elevated at 270 with mildly elevated trop and new on set afib rate controlled of unknown duration.   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 He was treated with antibiotics.   His afib rate improved with management of his pulmonary issues. Once cleared from an surgical standpoint, he was started on Coumadin for stroke prophylaxis. His diastolic HF was treated with diuretics.   Today in clinic, he reports that he has done well since discharge. No reccrrent dyspnea. No chest pain. He is asymptomatic with his atrial fibrillation. He denies any symptoms of dizziness, palpitations, dyspnea, syncope/near-syncope. He reports full compliance with Coumadin. He has a home health nurse at home however he reports that his INR has not been checked since he was discharged. He denies any abnormal bleeding. Also denies any recurrent lower extremity edema. His volume status is remaining well-controlled.  EKG today continues to show atrial  fibrillation with a CVR. BP is 98/56.    Current Meds  Medication Sig  . Ascorbic Acid (VITAMIN C) 1000 MG tablet Take 1,000 mg by mouth 2 (two) times daily.  [name] aspirin 81 MG tablet Take 81 mg by mouth daily. Reported on 05/08/2015  . furosemide (LASIX) 40 MG tablet Take 1 tablet (40 mg total) by mouth daily.  . methocarbamol (ROBAXIN) 500 MG tablet Take 1 tablet (500 mg total) by mouth every 8 (eight) hours as needed for muscle spasms.  . pantoprazole (PROTONIX) 40 MG tablet Take 1 tablet (40 mg total) by mouth daily.  [name] senna-docusate (SENOKOT-S) 8.6-50 MG tablet Take 1 tablet by mouth at bedtime as needed for mild constipation.  [name] spironolactone (ALDACTONE) 25 MG tablet Take 1 tablet (25 mg total) by mouth daily.  . traMADol (ULTRAM) 50 MG tablet Take 1 tablet (50 mg total) by mouth every 6 (six) hours as needed.  . warfarin (COUMADIN) 4 MG tablet Take 1 tablet (4 mg total) by mouth daily at 6 PM.   Allergies  Allergen Reactions  . Demerol [Meperidine] Other (See Comments)    UNSPECIFIED REACTION  Causes system to shutdown. ?   . Oxycodone Hcl Other (See Comments)    Pt states this medication 'wires him up' and makes pt hyper; pt does not want to take this ever again   Past Medical History:  Diagnosis Date  . Arthritis    "neck" (08/15/2015)  . Chronic bronchitis ([place])   . Constipation   . COPD (chronic obstructive pulmonary disease) ([place])   . DDD (degenerative disc disease), cervical   .  Edema of left lower extremity   . Facial basal cell cancer   . H/O acne vulgaris 1960s   "led to my discharge from the [facility] in the mid 1960s"  . Headache    history of - left temporal- years ago- not current (08/15/2015)  . Pneumonia 1960s; 2015 X 2  . Prostate cancer ([place]) dx'd early 2000s   "low spreading; non aggressive type" (08/15/2015)  . Skin cancer    "back"   Family History  Problem Relation Age of Onset  . Alcoholism Father   . CAD Father   . Ovarian cancer Sister   . Diabetes  Neg Hx    Past Surgical History:  Procedure Laterality Date  . COLONOSCOPY    . EXCISIONAL HEMORRHOIDECTOMY  1960s  . INGUINAL HERNIA REPAIR Right 2002  . INGUINAL HERNIA REPAIR Bilateral 08/15/2015  . INGUINAL HERNIA REPAIR Bilateral 08/15/2015   Procedure: OPEN REPAIR RECURRENT RIGHT INGUINAL HERNIA WITH MESH AND REPAIR LEFT INGUINAL HERNIA WITH MESH;  Surgeon: [name], MD;  Location: [place];  Service: General;  Laterality: Bilateral;  . INSERTION OF MESH Bilateral 08/15/2015   Procedure: INSERTION OF MESH;  Surgeon: [name], MD;  Location: [place];  Service: General;  Laterality: Bilateral;  . PROSTATE BIOPSY    . TONSILLECTOMY  1950s  . VIDEO ASSISTED THORACOSCOPY (VATS)/EMPYEMA Right 05/20/2016   Procedure: VIDEO ASSISTED THORACOSCOPY (VATS) with drainage of pleural effusion;  Surgeon: [name], MD;  Location: [place];  Service: Thoracic;  Laterality: Right;  [name] VIDEO BRONCHOSCOPY WITH ENDOBRONCHIAL ULTRASOUND Right 05/20/2016   Procedure: VIDEO BRONCHOSCOPY WITH ENDOBRONCHIAL ULTRASOUND;  Surgeon: [name], MD;  Location: [facility] OR;  Service: Thoracic;  Laterality: Right;   Social History   Social History  . Marital status: Married    Spouse name: N/A  . Number of children: N/A  . Years of education: N/A   Occupational History  . Not on file.   Social History Main Topics  . Smoking status: Current Every Day Smoker    Packs/day: 1.00    Years: 62.00    Types: Cigarettes  . Smokeless tobacco: Never Used  . Alcohol use No  . Drug use: No  . Sexual activity: Not Currently   Other Topics Concern  . Not on file   Social History Narrative  . No narrative on file     Review of Systems: General: negative for chills, fever, night sweats or weight changes.  Cardiovascular: negative for chest pain, dyspnea on exertion, edema, orthopnea, palpitations, paroxysmal nocturnal dyspnea or shortness of breath Dermatological: negative for rash Respiratory: negative for  cough or wheezing Urologic: negative for hematuria Abdominal: negative for nausea, vomiting, diarrhea, bright red blood per rectum, melena, or hematemesis Neurologic: negative for visual changes, syncope, or dizziness All other systems reviewed and are otherwise negative except as noted above.   Physical Exam:  Blood pressure (!) 98/56, pulse 76, height _0  (1.803 m), weight 156 lb 6.4 oz (70.9 kg), SpO2 97 %.  General appearance: alert, cooperative and no distress Neck: no carotid bruit and no JVD Lungs: clear to auscultation bilaterally Heart: regular rate and rhythm, S1, S2 normal, no murmur, click, rub or gallop Extremities: extremities normal, atraumatic, no cyanosis or edema Pulses: 2+ and symmetric Skin: Skin color, texture, turgor normal. No rashes or lesions Neurologic: Grossly normal  EKG atrial fibrillation with a CVR   ASSESSMENT AND PLAN:   1. Atrial Fibrillation: he remains in afib with a CVR.  Asymptomatic. He is on coumadin for a/c and we will check his INR today. Given recent events, VATS procedure and fact that is is not having any symptoms, we will continue to monitor and will not pursue DCCV at this time. We will give him more time to recover. I recommend that he continues Coumadin and f/u with Dr. Radford Pax in 6-8 more weeks. If still in afib, may consider attempt at cardioversion at that time.   2. Diastolic Dysfunction: 2D echo showed normal LVEF but diastolic dysfunction. He has responded well with diuretics. No further LEE. No dyspnea orthopnea/PND. He had a BMP last week that showed normal renal function and K.   3. 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 Followed by Dr Prescott Gum.   4. PNA: resolved. Completed antibiotics.   PLAN  F/u with Dr. Radford Pax in 6-8 weeks.   Fallou Hulbert PA-C 06/14/2016 11:18 AM

## 2016-06-14 NOTE — Patient Instructions (Addendum)
Your physician recommends that you continue on your current medications as directed. Please refer to the Current Medication list given to you today.  Your physician recommends that you schedule a follow-up appointment in:  6 - [place]

## 2016-06-14 NOTE — Patient Instructions (Signed)

## 2016-06-17 DIAGNOSIS — J9601 Acute respiratory failure with hypoxia: Secondary | ICD-10-CM | POA: Diagnosis not present

## 2016-06-17 DIAGNOSIS — Z5181 Encounter for therapeutic drug level monitoring: Secondary | ICD-10-CM | POA: Diagnosis not present

## 2016-06-20 ENCOUNTER — Encounter: Payer: Self-pay | Admitting: Nurse Practitioner

## 2016-06-20 ENCOUNTER — Ambulatory Visit (INDEPENDENT_AMBULATORY_CARE_PROVIDER_SITE_OTHER): Payer: Medicare Other | Admitting: Nurse Practitioner

## 2016-06-20 VITALS — BP 116/68 | HR 69 | Temp 97.8°F | Ht 71.0 in | Wt 156.0 lb

## 2016-06-20 DIAGNOSIS — R600 Localized edema: Secondary | ICD-10-CM | POA: Diagnosis not present

## 2016-06-20 DIAGNOSIS — Z0000 Encounter for general adult medical examination without abnormal findings: Secondary | ICD-10-CM

## 2016-06-20 DIAGNOSIS — Z9889 Other specified postprocedural states: Secondary | ICD-10-CM | POA: Diagnosis not present

## 2016-06-20 DIAGNOSIS — G4700 Insomnia, unspecified: Secondary | ICD-10-CM | POA: Diagnosis not present

## 2016-06-20 MED ORDER — DIPHENHYDRAMINE HCL 25 MG PO TABS
25.0000 mg | ORAL_TABLET | Freq: Every evening | ORAL | 0 refills | Status: DC | PRN
Start: 2016-06-20 — End: 2016-10-02

## 2016-06-20 NOTE — Progress Notes (Signed)
Pre visit review using our clinic review tool, if applicable. No additional management support is needed unless otherwise documented below in the visit note. 

## 2016-06-20 NOTE — Patient Instructions (Addendum)
Insomnia Insomnia is a sleep disorder that makes it difficult to fall asleep or to stay asleep. Insomnia can cause tiredness (fatigue), low energy, difficulty concentrating, mood swings, and poor performance at work or school. There are three different ways to classify insomnia:  Difficulty falling asleep.  Difficulty staying asleep.  Waking up too early in the morning. Any type of insomnia can be long-term (chronic) or short-term (acute). Both are common. Short-term insomnia usually lasts for three months or less. Chronic insomnia occurs at least three times a week for longer than three months. What are the causes? Insomnia may be caused by another condition, situation, or substance, such as:  Anxiety.  Certain medicines.  Gastroesophageal reflux disease (GERD) or other gastrointestinal conditions.  Asthma or other breathing conditions.  Restless legs syndrome, sleep apnea, or other sleep disorders.  Chronic pain.  Menopause. This may include hot flashes.  Stroke.  Abuse of alcohol, tobacco, or illegal drugs.  Depression.  Caffeine.  Neurological disorders, such as Alzheimer disease.  An overactive thyroid (hyperthyroidism). The cause of insomnia may not be known. What increases the risk? Risk factors for insomnia include:  Gender. Women are more commonly affected than men.  Age. Insomnia is more common as you get older.  Stress. This may involve your professional or personal life.  Income. Insomnia is more common in people with lower income.  Lack of exercise.  Irregular work schedule or night shifts.  Traveling between different time zones. What are the signs or symptoms? If you have insomnia, trouble falling asleep or trouble staying asleep is the main symptom. This may lead to other symptoms, such as:  Feeling fatigued.  Feeling nervous about going to sleep.  Not feeling rested in the morning.  Having trouble concentrating.  Feeling irritable,  anxious, or depressed. How is this treated? Treatment for insomnia depends on the cause. If your insomnia is caused by an underlying condition, treatment will focus on addressing the condition. Treatment may also include:  Medicines to help you sleep.  Counseling or therapy.  Lifestyle adjustments. Follow these instructions at home:  Take medicines only as directed by your health care provider.  Keep regular sleeping and waking hours. Avoid naps.  Keep a sleep diary to help you and your health care provider figure out what could be causing your insomnia. Include:  When you sleep.  When you wake up during the night.  How well you sleep.  How rested you feel the next day.  Any side effects of medicines you are taking.  What you eat and drink.  Make your bedroom a comfortable place where it is easy to fall asleep:  Put up shades or special blackout curtains to block light from outside.  Use a white noise machine to block noise.  Keep the temperature cool.  Exercise regularly as directed by your health care provider. Avoid exercising right before bedtime.  Use relaxation techniques to manage stress. Ask your health care provider to suggest some techniques that may work well for you. These may include:  Breathing exercises.  Routines to release muscle tension.  Visualizing peaceful scenes.  Cut back on alcohol, caffeinated beverages, and cigarettes, especially close to bedtime. These can disrupt your sleep.  Do not overeat or eat spicy foods right before bedtime. This can lead to digestive discomfort that can make it hard for you to sleep.  Limit screen use before bedtime. This includes:  Watching TV.  Using your smartphone, tablet, and computer.  Stick to a  routine. This can help you fall asleep faster. Try to do a quiet activity, brush your teeth, and go to bed at the same time each night.  Get out of bed if you are still awake after 15 minutes of trying to  sleep. Keep the lights down, but try reading or doing a quiet activity. When you feel sleepy, go back to bed.  Make sure that you drive carefully. Avoid driving if you feel very sleepy.  Keep all follow-up appointments as directed by your health care provider. This is important. Contact a health care provider if:  You are tired throughout the day or have trouble in your daily routine due to sleepiness.  You continue to have sleep problems or your sleep problems get worse. Get help right away if:  You have serious thoughts about hurting yourself or someone else. This information is not intended to replace advice given to you by your health care provider. Make sure you discuss any questions you have with your health care provider. Document Released: 04/26/2000 Document Revised: 09/29/2015 Document Reviewed: 01/28/2014 Elsevier Interactive Patient Education  2017 [place] Prevention in the Home Introduction Falls can cause injuries. They can happen to people of all ages. There are many things you can do to make your home safe and to help prevent falls. What can I do on the outside of my home?  Regularly fix the edges of walkways and driveways and fix any cracks.  Remove anything that might make you trip as you walk through a door, such as a raised step or threshold.  Trim any bushes or trees on the path to your home.  Use bright outdoor lighting.  Clear any walking paths of anything that might make someone trip, such as rocks or tools.  Regularly check to see if handrails are loose or broken. Make sure that both sides of any steps have handrails.  Any raised decks and porches should have guardrails on the edges.  Have any leaves, snow, or ice cleared regularly.  Use sand or salt on walking paths during winter.  Clean up any spills in your garage right away. This includes oil or grease spills. What can I do in the bathroom?  Use night lights.  Install grab bars by  the toilet and in the tub and shower. Do not use towel bars as grab bars.  Use non-skid mats or decals in the tub or shower.  If you need to sit down in the shower, use a plastic, non-slip stool.  Keep the floor dry. Clean up any water that spills on the floor as soon as it happens.  Remove soap buildup in the tub or shower regularly.  Attach bath mats securely with double-sided non-slip rug tape.  Do not have throw rugs and other things on the floor that can make you trip. What can I do in the bedroom?  Use night lights.  Make sure that you have a light by your bed that is easy to reach.  Do not use any sheets or blankets that are too big for your bed. They should not hang down onto the floor.  Have a firm chair that has side arms. You can use this for support while you get dressed.  Do not have throw rugs and other things on the floor that can make you trip. What can I do in the kitchen?  Clean up any spills right away.  Avoid walking on wet floors.  Keep items that you  use a lot in easy-to-reach places.  If you need to reach something above you, use a strong step stool that has a grab bar.  Keep electrical cords out of the way.  Do not use floor polish or wax that makes floors slippery. If you must use wax, use non-skid floor wax.  Do not have throw rugs and other things on the floor that can make you trip. What can I do with my stairs?  Do not leave any items on the stairs.  Make sure that there are handrails on both sides of the stairs and use them. Fix handrails that are broken or loose. Make sure that handrails are as long as the stairways.  Check any carpeting to make sure that it is firmly attached to the stairs. Fix any carpet that is loose or worn.  Avoid having throw rugs at the top or bottom of the stairs. If you do have throw rugs, attach them to the floor with carpet tape.  Make sure that you have a light switch at the top of the stairs and the bottom  of the stairs. If you do not have them, ask someone to add them for you. What else can I do to help prevent falls?  Wear shoes that:  Do not have high heels.  Have rubber bottoms.  Are comfortable and fit you well.  Are closed at the toe. Do not wear sandals.  If you use a stepladder:  Make sure that it is fully opened. Do not climb a closed stepladder.  Make sure that both sides of the stepladder are locked into place.  Ask someone to hold it for you, if possible.  Clearly mark and make sure that you can see:  Any grab bars or handrails.  First and last steps.  Where the edge of each step is.  Use tools that help you move around (mobility aids) if they are needed. These include:  Canes.  Walkers.  Scooters.  Crutches.  Turn on the lights when you go into a dark area. Replace any light bulbs as soon as they burn out.  Set up your furniture so you have a clear path. Avoid moving your furniture around.  If any of your floors are uneven, fix them.  If there are any pets around you, be aware of where they are.  Review your medicines with your doctor. Some medicines can make you feel dizzy. This can increase your chance of falling. Ask your doctor what other things that you can do to help prevent falls. This information is not intended to replace advice given to you by your health care provider. Make sure you discuss any questions you have with your health care provider. Document Released: 02/23/2009 Document Revised: 10/05/2015 Document Reviewed: 06/03/2014  2017 Elsevier

## 2016-06-20 NOTE — Progress Notes (Signed)
Subjective:    Patient ID: [name], male    DOB: 1946/02/09, 71 y.o.   MRN: [id number]  Patient presents today for establish care (new patient)  Insomnia  Primary symptoms: fragmented sleep, sleep disturbance, no difficulty falling asleep, no somnolence, no frequent awakening, no premature morning awakening, no malaise/fatigue, no napping.   The current episode started 1 to 4 weeks ago. The onset quality is sudden. The problem occurs intermittently. The problem has been waxing and waning since onset. The symptoms are aggravated by pain (recent hospitalization, s/p VATs). How many beverages per day that contain caffeine: 0 - 1.  Relieved by: reposition and use of tramadol. The treatment provided significant relief. Typical bedtime:  8-10 P.M..  How long after going to bed to you fall asleep: less than 15 minutes.   PMH includes: no hypertension, no depression, no family stress or anxiety, no restless leg syndrome, no work related stressors, chronic pain, no apnea.  He is trying not to take tramadol due to constipation.  Immunizations: (TDAP, Hep C screen, Pneumovax, Influenza, zoster)  Health Maintenance  Topic Date Due  .  Hepatitis C: One time screening is recommended by Center for Disease Control  (CDC) for  adults born from [birth year] through 1967.   10-19-1945  . Tetanus Vaccine  06/03/1962  . Colon Cancer Screening  06/03/1993  . Shingles Vaccine  06/04/2003  . Pneumonia vaccines (1 of 2 - PCV13) 06/03/2008  . Flu Shot  Addressed   Diet:regular Weight:  Wt Readings from Last 3 Encounters:  06/20/16 156 lb (70.8 kg)  06/14/16 156 lb 6.4 oz (70.9 kg)  06/12/16 155 lb (70.3 kg)   Exercise:walking Fall Risk: Fall Risk  06/20/2016  Falls in the past year? No   Home Safety:home with wife and son Depression/Suicide: Depression screen [facility] 2/9 06/20/2016 05/08/2015  Decreased Interest 0 0  Down, Depressed, Hopeless 0 0  PHQ - 2 Score 0 0   Advanced Directive: Advanced Directives  06/08/2016  Does Patient Have a Medical Advance Directive? No  Would patient like information on creating a medical advance directive? No - Patient declined   Sexual History (birth control, marital status, STD):married, erectile dysfunction since inguinal hernia repair.  Medications and allergies reviewed with patient and updated if appropriate.  Patient Active Problem List   Diagnosis Date Noted  . Encounter for therapeutic drug monitoring 06/14/2016  . Septic shock ([place])   . Bilateral lower extremity edema   . Atrial fibrillation ([place])   . Acute respiratory failure with hypoxia ([place]) 05/21/2016  . Lung mass 05/21/2016  . Loculated pleural effusion 05/21/2016  . Parapneumonic effusion 05/21/2016  . Empyema ([place]) 05/21/2016  . S/P thoracentesis   . Bilateral pleural effusion 05/14/2016  . COPD (chronic obstructive pulmonary disease) ([place]) 05/14/2016  . Tobacco abuse 05/14/2016  . Protein-calorie malnutrition, severe ([place]) 05/14/2016    Current Outpatient Prescriptions on File Prior to Visit  Medication Sig Dispense Refill  . Ascorbic Acid (VITAMIN C) 1000 MG tablet Take 1,000 mg by mouth 2 (two) times daily.    . furosemide (LASIX) 40 MG tablet Take 1 tablet (40 mg total) by mouth daily. 30 tablet 0  . pantoprazole (PROTONIX) 40 MG tablet Take 1 tablet (40 mg total) by mouth daily. 30 tablet 0  . senna-docusate (SENOKOT-S) 8.6-50 MG tablet Take 1 tablet by mouth at bedtime as needed for mild constipation. 30 tablet 0  . spironolactone (ALDACTONE) 25 MG tablet Take 1 tablet (25 mg  total) by mouth daily. 30 tablet 0  . traMADol (ULTRAM) 50 MG tablet Take 1 tablet (50 mg total) by mouth every 6 (six) hours as needed. 30 tablet 0  . warfarin (COUMADIN) 4 MG tablet Take 1 tablet (4 mg total) by mouth daily at 6 PM. Or as directed by Coumadin Clinic 40 tablet 1   No current facility-administered medications on file prior to visit.     Past Medical History:  Diagnosis Date  .  Arthritis    "neck" (08/15/2015)  . Chronic bronchitis ([place])   . Constipation   . COPD (chronic obstructive pulmonary disease) ([place])   . DDD (degenerative disc disease), cervical   . Edema of left lower extremity   . Facial basal cell cancer   . H/O acne vulgaris 1960s   "led to my discharge from the [facility] in the mid 1960s"  . Headache    history of - left temporal- years ago- not current (08/15/2015)  . Pneumonia 1960s; 2015 X 2  . Prostate cancer ([place]) dx'd early 2000s   "low spreading; non aggressive type" (08/15/2015)  . Skin cancer    "back"    Past Surgical History:  Procedure Laterality Date  . COLONOSCOPY    . EXCISIONAL HEMORRHOIDECTOMY  1960s  . INGUINAL HERNIA REPAIR Right 2002  . INGUINAL HERNIA REPAIR Bilateral 08/15/2015  . INGUINAL HERNIA REPAIR Bilateral 08/15/2015   Procedure: OPEN REPAIR RECURRENT RIGHT INGUINAL HERNIA WITH MESH AND REPAIR LEFT INGUINAL HERNIA WITH MESH;  Surgeon: [name], MD;  Location: [place];  Service: General;  Laterality: Bilateral;  . INSERTION OF MESH Bilateral 08/15/2015   Procedure: INSERTION OF MESH;  Surgeon: [name], MD;  Location: [place];  Service: General;  Laterality: Bilateral;  . PROSTATE BIOPSY    . TONSILLECTOMY  1950s  . VIDEO ASSISTED THORACOSCOPY (VATS)/EMPYEMA Right 05/20/2016   Procedure: VIDEO ASSISTED THORACOSCOPY (VATS) with drainage of pleural effusion;  Surgeon: [name], MD;  Location: [place];  Service: Thoracic;  Laterality: Right;  [name] VIDEO BRONCHOSCOPY WITH ENDOBRONCHIAL ULTRASOUND Right 05/20/2016   Procedure: VIDEO BRONCHOSCOPY WITH ENDOBRONCHIAL ULTRASOUND;  Surgeon: [name], MD;  Location: [facility] OR;  Service: Thoracic;  Laterality: Right;    Social History   Social History  . Marital status: Married    Spouse name: N/A  . Number of children: N/A  . Years of education: N/A   Social History Main Topics  . Smoking status: Former Smoker    Packs/day: 1.00    Years: 62.00    Types: Cigarettes     Quit date: 05/14/2016  . Smokeless tobacco: Never Used  . Alcohol use No  . Drug use: No  . Sexual activity: Not Currently   Other Topics Concern  . None   Social History Narrative  . None    Family History  Problem Relation Age of Onset  . Cancer Mother     colon cancer  . Alcoholism Father   . CAD Father   . Alcohol abuse Father   . Ovarian cancer Sister   . Diabetes Neg Hx         Review of Systems  Constitutional: Negative for fever, malaise/fatigue and weight loss.  HENT: Negative for congestion and sore throat.   Eyes:       Negative for visual changes  Respiratory: Negative for apnea, cough and shortness of breath.   Cardiovascular: Negative for chest pain, palpitations, leg swelling and PND.  Gastrointestinal: Positive for constipation.  Negative for abdominal pain, blood in stool, diarrhea, heartburn, nausea and vomiting.  Genitourinary: Negative for dysuria, frequency and urgency.  Musculoskeletal: Positive for back pain. Negative for falls, joint pain and myalgias.       Right lateral chest wall tenderness since VATs. Managed with tramadol prn  Skin: Negative for rash.  Neurological: Negative for dizziness, sensory change, focal weakness and headaches.  Endo/Heme/Allergies: Does not bruise/bleed easily.  Psychiatric/Behavioral: Positive for sleep disturbance. Negative for depression, substance abuse and suicidal ideas. The patient has insomnia. The patient is not nervous/anxious.     Objective:   Vitals:   06/20/16 1415  BP: 116/68  Pulse: 69  Temp: 97.8 F (36.6 C)    Body mass index is 21.76 kg/m.   Physical Examination:  Physical Exam  Constitutional: He is oriented to person, place, and time. No distress.  HENT:  Mouth/Throat: No oropharyngeal exudate.  Neck: Normal range of motion. Neck supple.  Cardiovascular: Normal rate and normal heart sounds.   Pulmonary/Chest: Effort normal and breath sounds normal. No respiratory distress. He has  no wheezes. He has no rales.  Abdominal: Soft. Bowel sounds are normal.  Musculoskeletal: He exhibits edema. He exhibits no tenderness.  Neurological: He is alert and oriented to person, place, and time. Gait normal.  Skin: Skin is warm and dry. No rash noted. No erythema.  Right lateral chest wall incision (dry and intact, wall approximated, no erythema).  Psychiatric: Affect and judgment normal.  Vitals reviewed.   ASSESSMENT and PLAN:  Tarance was seen today for establish care.  Diagnoses and all orders for this visit:  Encounter for medical examination to establish care  Bilateral lower extremity edema  S/P thoracentesis  Insomnia, unspecified type -     diphenhydrAMINE (BENADRYL ALLERGY) 25 MG tablet; Take 1 tablet (25 mg total) by mouth at bedtime as needed for sleep.    No problem-specific Assessment & Plan notes found for this encounter.      Follow up: Return in about 3 months (around 09/17/2016) for Insomnia and CPE (fasting, needs labs).  Wilfred Lacy, NP

## 2016-06-21 ENCOUNTER — Ambulatory Visit (INDEPENDENT_AMBULATORY_CARE_PROVIDER_SITE_OTHER): Payer: Medicare Other | Admitting: *Deleted

## 2016-06-21 DIAGNOSIS — Z5181 Encounter for therapeutic drug level monitoring: Secondary | ICD-10-CM | POA: Diagnosis not present

## 2016-06-21 DIAGNOSIS — I4891 Unspecified atrial fibrillation: Secondary | ICD-10-CM

## 2016-06-21 LAB — POCT INR: INR: 1.9

## 2016-06-28 ENCOUNTER — Ambulatory Visit (INDEPENDENT_AMBULATORY_CARE_PROVIDER_SITE_OTHER): Payer: Medicare Other | Admitting: *Deleted

## 2016-06-28 DIAGNOSIS — I4891 Unspecified atrial fibrillation: Secondary | ICD-10-CM

## 2016-06-28 DIAGNOSIS — Z5181 Encounter for therapeutic drug level monitoring: Secondary | ICD-10-CM

## 2016-06-28 LAB — POCT INR: INR: 2

## 2016-07-02 LAB — ACID FAST CULTURE WITH REFLEXED SENSITIVITIES (MYCOBACTERIA)
Acid Fast Culture: NEGATIVE
Acid Fast Culture: NEGATIVE

## 2016-07-05 ENCOUNTER — Other Ambulatory Visit: Payer: Self-pay | Admitting: *Deleted

## 2016-07-05 ENCOUNTER — Ambulatory Visit (INDEPENDENT_AMBULATORY_CARE_PROVIDER_SITE_OTHER): Payer: Medicare Other | Admitting: Pharmacist

## 2016-07-05 DIAGNOSIS — I4891 Unspecified atrial fibrillation: Secondary | ICD-10-CM

## 2016-07-05 DIAGNOSIS — Z5181 Encounter for therapeutic drug level monitoring: Secondary | ICD-10-CM

## 2016-07-05 DIAGNOSIS — J90 Pleural effusion, not elsewhere classified: Secondary | ICD-10-CM

## 2016-07-05 LAB — POCT INR: INR: 1.7

## 2016-07-10 ENCOUNTER — Encounter: Payer: Self-pay | Admitting: Cardiothoracic Surgery

## 2016-07-10 ENCOUNTER — Ambulatory Visit
Admission: RE | Admit: 2016-07-10 | Discharge: 2016-07-10 | Disposition: A | Discharge status: Home / Self Care | Payer: Medicare Other | Source: Ambulatory Visit | Attending: Cardiothoracic Surgery | Admitting: Cardiothoracic Surgery

## 2016-07-10 ENCOUNTER — Ambulatory Visit (INDEPENDENT_AMBULATORY_CARE_PROVIDER_SITE_OTHER): Payer: Self-pay | Admitting: Cardiothoracic Surgery

## 2016-07-10 VITALS — BP 107/64 | HR 74 | Resp 16 | Ht 71.0 in | Wt 158.2 lb

## 2016-07-10 DIAGNOSIS — J869 Pyothorax without fistula: Secondary | ICD-10-CM

## 2016-07-10 DIAGNOSIS — J90 Pleural effusion, not elsewhere classified: Secondary | ICD-10-CM

## 2016-07-10 DIAGNOSIS — Z09 Encounter for follow-up examination after completed treatment for conditions other than malignant neoplasm: Secondary | ICD-10-CM

## 2016-07-10 MED ORDER — FUROSEMIDE 40 MG PO TABS: 40.0000 mg | ORAL_TABLET | ORAL | 3 refills | Status: DC

## 2016-07-10 MED ORDER — SPIRONOLACTONE 25 MG PO TABS: 25.0000 mg | ORAL_TABLET | Freq: Every day | ORAL | 3 refills | Status: DC

## 2016-07-10 NOTE — Patient Instructions (Signed)
Do not resume smoking cigarettes or any other tobacco products.  You may return to driving an automobile as long as you are no longer requiring oral narcotic pain relievers during the daytime.  It would be wise to start driving only short distances during the daylight and gradually increase from there as you feel comfortable.  You may gradually increase your physical activity as tolerated without any particular limitations at this time.

## 2016-07-10 NOTE — Progress Notes (Signed)
HPI: Patient returns for 4 week follow up.  He is S/P VATS with drainage of empyema on the right.  This was done on 05/20/2016.  All cultures were negative.  His hospital course was complicated by Atrial Fibrillation.  He ultimately required treatment with Coumadin.  He remains on Coumadin at this time.  He completed a course of Augmentin.  He was last evaluated by Dr Prescott Gum on 06/12/2016 at which time the patient's wound were healing well. He was experiencing post thoracotomy syndrome pain.  He was given Voltaren gel at that time.  However, per the patient's wife they did not fill this medication due to the side effects.  He was also given tramadol at his last visit which did not provide relief of symptoms.  He has since stopped his lasix stating he ran out and he was instructed to stop by Jolene.  Currently the patient states he is doing well.  He continues to have post operative thoracotomy pain symptoms.  He is concerned how long he is going to have these symptoms.  He continues to not smoke.  He remains on Coumadin and asks how long he will have to stay on this medication.    Current Outpatient Prescriptions  Medication Sig Dispense Refill  . Ascorbic Acid (VITAMIN C) 1000 MG tablet Take 1,000 mg by mouth 2 (two) times daily.    . diphenhydrAMINE (BENADRYL ALLERGY) 25 MG tablet Take 1 tablet (25 mg total) by mouth at bedtime as needed for sleep. 30 tablet 0  . Probiotic Product (SOLUBLE FIBER/PROBIOTICS PO) Take 2 capsules by mouth daily.    [name] senna-docusate (SENOKOT-S) 8.6-50 MG tablet Take 1 tablet by mouth at bedtime as needed for mild constipation. 30 tablet 0  . traMADol (ULTRAM) 50 MG tablet Take 1 tablet (50 mg total) by mouth every 6 (six) hours as needed. 30 tablet 0  . warfarin (COUMADIN) 4 MG tablet Take 1 tablet (4 mg total) by mouth daily at 6 PM. Or as directed by Coumadin Clinic 40 tablet 1  . furosemide (LASIX) 40 MG tablet Take 1 tablet (40 mg total) by mouth every Monday,  Wednesday, and Friday. 30 tablet 3  . spironolactone (ALDACTONE) 25 MG tablet Take 1 tablet (25 mg total) by mouth daily. 30 tablet 3   No current facility-administered medications for this visit.     Physical Exam:  BP 107/64 (BP Location: Left Arm, Patient Position: Sitting, Cuff Size: Normal)   Pulse 74   Resp 16   Ht _0  (1.803 m)   Wt 158 lb 3.2 oz (71.8 kg)   SpO2 98% Comment: ON RA  BMI 22.06 kg/m    Gen; no apparent distress Heart: Irregular rate and rhythm Lungs: diminished bibasilar, diminished in right upper airway Incisions; well healed Ext: 1+ edema bilaterally  Diagnostic Tests:  CXR; minimal bilateral pleural effusions are unchanged from previous film.  There is a new accumulation of pleural fluid in the fissure of the RUL/RML  A/P:  1. Pulm- CXR with new accumulation of pleural fluid in fissure between right upper and right middle lobe... Will restart patient's Lasix daily for 3 days, then every MWF and Spironolactone daily 2. CV- A. Fib, remains on coumadin, will continue for now and f/u with Cardiology in 6 weeks at that time... If remains in A. 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 I encouraged patient to give this a try and he develops  side effects to stop medication.  However, I did explain that this type of pain is related to nerve damage from the surgery.  It will resolve with time, but I am unable to give a definitive answer to when this will occur.  I also explained that narcotic pain medication will not provide relief for this type of pain 4. Dispo- patient doing well, redeveloped mild pleural fluid will restart diuretic therapy, continue coumadin and close follow up with Cardiology with possible cardioversion in the next couple of weeks... RTC in April/May with repeat CT of the chest W/O constrast  Vinessa Macconnell, PA-C [facility] [phone]

## 2016-07-15 ENCOUNTER — Ambulatory Visit (INDEPENDENT_AMBULATORY_CARE_PROVIDER_SITE_OTHER): Payer: Medicare Other | Admitting: *Deleted

## 2016-07-15 DIAGNOSIS — Z5181 Encounter for therapeutic drug level monitoring: Secondary | ICD-10-CM | POA: Diagnosis not present

## 2016-07-15 DIAGNOSIS — I4891 Unspecified atrial fibrillation: Secondary | ICD-10-CM

## 2016-07-15 LAB — POCT INR: INR: 2

## 2016-07-16 ENCOUNTER — Telehealth: Payer: Self-pay | Admitting: Family Medicine

## 2016-07-16 NOTE — Telephone Encounter (Signed)
[place] called to get pt home phone number

## 2016-07-29 ENCOUNTER — Ambulatory Visit (INDEPENDENT_AMBULATORY_CARE_PROVIDER_SITE_OTHER): Payer: Medicare Other | Admitting: Pharmacist

## 2016-07-29 DIAGNOSIS — Z5181 Encounter for therapeutic drug level monitoring: Secondary | ICD-10-CM | POA: Diagnosis not present

## 2016-07-29 DIAGNOSIS — I4891 Unspecified atrial fibrillation: Secondary | ICD-10-CM | POA: Diagnosis not present

## 2016-07-29 LAB — POCT INR: INR: 1.8

## 2016-08-04 ENCOUNTER — Encounter: Payer: Self-pay | Admitting: Cardiology

## 2016-08-04 DIAGNOSIS — I272 Pulmonary hypertension, unspecified: Secondary | ICD-10-CM | POA: Insufficient documentation

## 2016-08-04 DIAGNOSIS — I77810 Thoracic aortic ectasia: Secondary | ICD-10-CM | POA: Insufficient documentation

## 2016-08-04 DIAGNOSIS — I4819 Other persistent atrial fibrillation: Secondary | ICD-10-CM | POA: Insufficient documentation

## 2016-08-04 DIAGNOSIS — I5043 Acute on chronic combined systolic (congestive) and diastolic (congestive) heart failure: Secondary | ICD-10-CM | POA: Insufficient documentation

## 2016-08-04 NOTE — Progress Notes (Signed)
Cardiology Office Note    Date:  08/05/2016   ID:  [name], DOB Aug 26, 1945, MRN [id number]  PCP:  Wilfred Lacy, NP  Cardiologist:  Fransico Him, MD   Chief Complaint  Patient presents with  . Congestive Heart Failure  . Hypertension  . Atrial Fibrillation    History of Present Illness:  [name] is a 71 y.o. male with a history of COPD, tobacco abuse, centrilobular emphysema s/p VATS with bx of subcarinal mass and persistent atrial fibrillation on coumadin moderate pulmonary HTN, dilated aortic root and chronic diastolic CHF. He presents today for followup.  He reports full compliance with Coumadin.  He denies any chest pain or pressure.  His SOB has completely resolved and he has not had any SOB, DOE, PND or orthopnea.  He still has fatigue due to deconditioninig and recent illnesses. He denies any dizziness, palpitations or syncope.  He has chronic LE edema which is controlled on diuretics and compression hose. He denies any abnormal bleeding.  His volume status is remaining well-controlled.   Past Medical History:  Diagnosis Date  . Arthritis    "neck" (08/15/2015)  . Chronic bronchitis ([place])   . Chronic diastolic CHF (congestive heart failure) ([place])   . Constipation   . COPD (chronic obstructive pulmonary disease) ([place])   . DDD (degenerative disc disease), cervical   . Edema of left lower extremity   . Facial basal cell cancer   . H/O acne vulgaris 1960s   "led to my discharge from the [facility] in the mid 1960s"  . Headache    history of - left temporal- years ago- not current (08/15/2015)  . Persistent atrial fibrillation ([facility])    on coumadin with CHADS2VASC score of 2  . Pneumonia 1960s; 2015 X 2  . Prostate cancer ([place]) dx'd early 2000s   "low spreading; non aggressive type" (08/15/2015)  . Skin cancer    "back"    Past Surgical History:  Procedure Laterality Date  . COLONOSCOPY    . EXCISIONAL HEMORRHOIDECTOMY  1960s  . INGUINAL HERNIA REPAIR Right  2002  . INGUINAL HERNIA REPAIR Bilateral 08/15/2015  . INGUINAL HERNIA REPAIR Bilateral 08/15/2015   Procedure: OPEN REPAIR RECURRENT RIGHT INGUINAL HERNIA WITH MESH AND REPAIR LEFT INGUINAL HERNIA WITH MESH;  Surgeon: [name], MD;  Location: [place];  Service: General;  Laterality: Bilateral;  . INSERTION OF MESH Bilateral 08/15/2015   Procedure: INSERTION OF MESH;  Surgeon: [name], MD;  Location: [place];  Service: General;  Laterality: Bilateral;  . PROSTATE BIOPSY    . TONSILLECTOMY  1950s  . VIDEO ASSISTED THORACOSCOPY (VATS)/EMPYEMA Right 05/20/2016   Procedure: VIDEO ASSISTED THORACOSCOPY (VATS) with drainage of pleural effusion;  Surgeon: [name], MD;  Location: [place];  Service: Thoracic;  Laterality: Right;  [name] VIDEO BRONCHOSCOPY WITH ENDOBRONCHIAL ULTRASOUND Right 05/20/2016   Procedure: VIDEO BRONCHOSCOPY WITH ENDOBRONCHIAL ULTRASOUND;  Surgeon: [name], MD;  Location: [facility] OR;  Service: Thoracic;  Laterality: Right;    Current Medications: Current Meds  Medication Sig  . Ascorbic Acid (VITAMIN C) 1000 MG tablet Take 1,000 mg by mouth 2 (two) times daily.  . diphenhydrAMINE (BENADRYL ALLERGY) 25 MG tablet Take 1 tablet (25 mg total) by mouth at bedtime as needed for sleep.  . furosemide (LASIX) 40 MG tablet Take 1 tablet (40 mg total) by mouth every Monday, Wednesday, and Friday.  . Probiotic Product (SOLUBLE FIBER/PROBIOTICS PO) Take 2 capsules by mouth daily.  [name]  senna-docusate (SENOKOT-S) 8.6-50 MG tablet Take 1 tablet by mouth at bedtime as needed for mild constipation.  [name] spironolactone (ALDACTONE) 25 MG tablet Take 1 tablet (25 mg total) by mouth daily.  . traMADol (ULTRAM) 50 MG tablet Take 1 tablet (50 mg total) by mouth every 6 (six) hours as needed.  . warfarin (COUMADIN) 4 MG tablet Take 1 tablet (4 mg total) by mouth daily at 6 PM. Or as directed by Coumadin Clinic    Allergies:   Demerol [meperidine] and Oxycodone hcl   Social History   Social  History  . Marital status: Married    Spouse name: N/A  . Number of children: N/A  . Years of education: N/A   Social History Main Topics  . Smoking status: Former Smoker    Packs/day: 1.00    Years: 62.00    Types: Cigarettes    Quit date: 05/14/2016  . Smokeless tobacco: Never Used  . Alcohol use No  . Drug use: No  . Sexual activity: Not Currently   Other Topics Concern  . None   Social History Narrative  . None     Family History:  The patient's family history includes Alcohol abuse in his father; Alcoholism in his father; CAD in his father; Cancer in his mother; Ovarian cancer in his sister.   ROS:   Please see the history of present illness.    ROS All other systems reviewed and are negative.  No flowsheet data found.     PHYSICAL EXAM:   VS:  BP 92/70   Pulse 66   Ht _0  (1.803 m)   Wt 160 lb 1.9 oz (72.6 kg)   SpO2 98%   BMI 22.33 kg/m    GEN: Well nourished, well developed, in no acute distress  HEENT: normal  Neck: no JVD, carotid bruits, or masses Cardiac: irregluarly irregular; no murmurs, rubs, or gallops,no edema.  Intact distal pulses bilaterally.  Respiratory:  clear to auscultation bilaterally, normal work of breathing GI: soft, nontender, nondistended, + BS MS: no deformity or atrophy  Skin: warm and dry, no rash Neuro:  Alert and Oriented x 3, Strength and sensation are intact Psych: euthymic mood, full affect  Wt Readings from Last 3 Encounters:  08/05/16 160 lb 1.9 oz (72.6 kg)  07/10/16 158 lb 3.2 oz (71.8 kg)  06/20/16 156 lb (70.8 kg)      Studies/Labs Reviewed:   EKG:  EKG is ordered today and showed atrial fibrillation with IRBBB, PVCs and ST/T wave abnormality in the inferolateral leads.    Recent Labs: 05/14/2016: B Natriuretic Peptide 270.7; TSH 1.187 05/29/2016: Magnesium 1.8 06/08/2016: ALT 29; BUN 48; Creatinine, Ser 1.10; Hemoglobin 10.8; Platelets 383; Potassium 4.7; Sodium 130   Lipid Panel No results found for:  CHOL, TRIG, HDL, CHOLHDL, VLDL, LDLCALC, LDLDIRECT  Additional studies/ records that were reviewed today include:  Hospital notes    ASSESSMENT:    1. Persistent atrial fibrillation ([place])   2. Chronic diastolic CHF (congestive heart failure) (H[facility])   3. Pulmonary HTN   4. Dilated aortic root ([facility])      PLAN:  In order of problems listed above:  1. Persistent atrial fibrillation- rate controlled with CHADS2VASC score of 2.  He will continue on warfarin.  He would like to get off warfarin onto a NOAC.  I will check with CVTS to make sure there is no reason why we cannot change.  Once he has been on NOAC for 4 weeks  we will set him up for DCCV. 2. Chronic diastolic CHF - he appears euvolemic on exam.  His weight is stable.  He will continue on lasix and aldactone. 3. Moderate pulmonary HTN - PASP is 70mHg and likely secondary to group 2 with pulmonary venous HTN as well group 3 from PNA and effusion at time of last echo.  Will repeat echo 05/2017 to make sure it has not progressed. 4. Dilated aortic root - Repeat echo in 05/2017 to assess for progression. He needs aggressive control of BP.   5. Abnormal EKG with marked ST/T wave abnormality in the inferolateral leads. I will get a lexiscan myoview to rule out ischemia.   He will followup with PA in 4-5 weeks to set up for DCCV  Medication Adjustments/Labs and Tests Ordered: Current medicines are reviewed at length with the patient today.  Concerns regarding medicines are outlined above.  Medication changes, Labs and Tests ordered today are listed in the Patient Instructions below.  There are no Patient Instructions on file for this visit.   Signed, TFransico Him MD  08/05/2016 10:49 AM    [address] [place]  [id number]: [phone] Fax: [phone]

## 2016-08-05 ENCOUNTER — Encounter: Payer: Self-pay | Admitting: Cardiology

## 2016-08-05 ENCOUNTER — Ambulatory Visit (INDEPENDENT_AMBULATORY_CARE_PROVIDER_SITE_OTHER): Payer: Medicare Other | Admitting: Cardiology

## 2016-08-05 ENCOUNTER — Ambulatory Visit (INDEPENDENT_AMBULATORY_CARE_PROVIDER_SITE_OTHER): Payer: Medicare Other | Admitting: Pharmacist

## 2016-08-05 DIAGNOSIS — Z5181 Encounter for therapeutic drug level monitoring: Secondary | ICD-10-CM

## 2016-08-05 DIAGNOSIS — I77810 Thoracic aortic ectasia: Secondary | ICD-10-CM

## 2016-08-05 DIAGNOSIS — I4819 Other persistent atrial fibrillation: Secondary | ICD-10-CM

## 2016-08-05 DIAGNOSIS — I272 Pulmonary hypertension, unspecified: Secondary | ICD-10-CM

## 2016-08-05 DIAGNOSIS — I5032 Chronic diastolic (congestive) heart failure: Secondary | ICD-10-CM | POA: Diagnosis not present

## 2016-08-05 DIAGNOSIS — I481 Persistent atrial fibrillation: Secondary | ICD-10-CM

## 2016-08-05 LAB — POCT INR: INR: 2.5

## 2016-08-05 NOTE — Patient Instructions (Addendum)
Medication Instructions:  Your physician recommends that you continue on your current medications as directed. Please refer to the Current Medication list given to you today.   Labwork: None  Testing/Procedures: Your physician has requested that you have a lexiscan myoview. For further information please visit [email or web address]. Please follow instruction sheet, as given.   Your physician has requested that you have an echocardiogram in January 2019. Echocardiography is a painless test that uses sound waves to create images of your heart. It provides your doctor with information about the size and shape of your heart and how well your heart's chambers and valves are working. This procedure takes approximately one hour. There are no restrictions for this procedure.   Follow-Up: Your physician recommends that you schedule a follow-up appointment in 4 WEEKS with Dr. Theodosia Blender assistant.  Any Other Special Instructions Will Be Listed Below (If Applicable).     If you need a refill on your cardiac medications before your next appointment, please call your pharmacy.

## 2016-08-06 ENCOUNTER — Telehealth (HOSPITAL_COMMUNITY): Payer: Self-pay | Admitting: *Deleted

## 2016-08-06 NOTE — Telephone Encounter (Signed)
Patient given detailed instructions per Myocardial Perfusion Study Information Sheet for the test on  08/08/16. Patient notified to arrive 15 minutes early and that it is imperative to arrive on time for appointment to keep from having the test rescheduled.  If you need to cancel or reschedule your appointment, please call the office within 24 hours of your appointment. Failure to do so may result in a cancellation of your appointment, and a $50 no show fee. Patient verbalized understanding. [name]

## 2016-08-08 ENCOUNTER — Ambulatory Visit (HOSPITAL_COMMUNITY): Payer: Medicare Other | Attending: Cardiovascular Disease

## 2016-08-08 DIAGNOSIS — I481 Persistent atrial fibrillation: Secondary | ICD-10-CM | POA: Diagnosis not present

## 2016-08-08 DIAGNOSIS — I5032 Chronic diastolic (congestive) heart failure: Secondary | ICD-10-CM | POA: Diagnosis not present

## 2016-08-08 DIAGNOSIS — I4819 Other persistent atrial fibrillation: Secondary | ICD-10-CM

## 2016-08-08 LAB — MYOCARDIAL PERFUSION IMAGING
CHL CUP NUCLEAR SDS: 1
CHL CUP NUCLEAR SRS: 5
CHL CUP RESTING HR STRESS: 76 {beats}/min
CHL CUP STRESS STAGE 1 DBP: 73 mmHg
CHL CUP STRESS STAGE 1 HR: 85 {beats}/min
CHL CUP STRESS STAGE 1 SBP: 116 mmHg
CHL CUP STRESS STAGE 2 SPEED: 0 mph
CHL CUP STRESS STAGE 3 DBP: 62 mmHg
CHL CUP STRESS STAGE 3 GRADE: 0 %
CHL CUP STRESS STAGE 3 SPEED: 0 mph
CHL CUP STRESS STAGE 4 GRADE: 0 %
CHL CUP STRESS STAGE 4 HR: 92 {beats}/min
CHL CUP STRESS STAGE 4 SPEED: 0 mph
CHL CUP STRESS STAGE 5 DBP: 56 mmHg
CHL CUP STRESS STAGE 5 HR: 92 {beats}/min
CHL CUP STRESS STAGE 5 SBP: 89 mmHg
CSEPPMHR: 62 %
Estimated workload: 1 METS
LHR: 0.37
LV dias vol: 95 mL (ref 62–150)
LV sys vol: 40 mL
NUC STRESS TID: 1.12
Peak HR: 92 {beats}/min
SSS: 6
Stage 1 Grade: 0 %
Stage 1 Speed: 0 mph
Stage 2 Grade: 0 %
Stage 2 HR: 83 {beats}/min
Stage 3 HR: 82 {beats}/min
Stage 3 SBP: 128 mmHg
Stage 5 Grade: 0 %
Stage 5 Speed: 0 mph
Stage 6 DBP: 64 mmHg
Stage 6 Grade: 0 %
Stage 6 HR: 77 {beats}/min
Stage 6 SBP: 111 mmHg
Stage 6 Speed: 0 mph

## 2016-08-08 MED ORDER — REGADENOSON 0.4 MG/5ML IV SOLN
0.4000 mg | Freq: Once | INTRAVENOUS | Status: AC
  Administered 2016-08-08: 0.4 mg via INTRAVENOUS

## 2016-08-08 MED ORDER — TECHNETIUM TC 99M TETROFOSMIN IV KIT
30.6000 | PACK | Freq: Once | INTRAVENOUS | Status: AC | PRN
  Administered 2016-08-08: 30.6 via INTRAVENOUS
  Filled 2016-08-08: qty 31

## 2016-08-08 MED ORDER — TECHNETIUM TC 99M TETROFOSMIN IV KIT
10.3000 | PACK | Freq: Once | INTRAVENOUS | Status: AC | PRN
  Administered 2016-08-08: 10.3 via INTRAVENOUS
  Filled 2016-08-08: qty 11

## 2016-08-14 ENCOUNTER — Telehealth: Payer: Self-pay | Admitting: Pharmacist

## 2016-08-14 NOTE — Telephone Encounter (Signed)
Pt with appt on 08/20/16 for warfarin will transition at this appt.

## 2016-08-14 NOTE — Telephone Encounter (Signed)
-----  Message from Loren Racer, LPN sent at 05/21/7586 10:48 AM EDT -----   ----- Message ----- From: [name], MD Sent: 08/13/2016   4:32 PM To: Theodoro [place], RN  Please set patient up to be seen in coumadin clinic to transition to [place], MD ----- Message ----- From: [name], MD Sent: 08/11/2016   9:36 AM To: [name], MD  Ok to transition to noac now- had bloody effusion so we used coumadin with initial lowe target inr Thanks PVT ----- Message ----- From: [name], MD Sent: 08/05/2016  11:03 AM To: [name], MD  Collier [place],  Any reason from your standpoint why I cannot change this patient to a NOAC.  Not sure why he was sent on on warfarin and not NOAC

## 2016-08-15 ENCOUNTER — Telehealth: Payer: Self-pay

## 2016-08-15 NOTE — Telephone Encounter (Signed)
-----  Message from [name], MD sent at 08/13/2016  4:32 PM EDT ----- Please set patient up to be seen in coumadin clinic to transition to Xarelto  Fransico Him, MD ----- Message ----- From: [name], MD Sent: 08/11/2016   9:36 AM To: [name], MD  Ok to transition to noac now- had bloody effusion so we used coumadin with initial lowe target inr Thanks PVT ----- Message ----- From: [name], MD Sent: 08/05/2016  11:03 AM To: [name], MD  Collier [place]ina,  Any reason from your standpoint why I cannot change this patient to a NOAC.  Not sure why he was sent on on warfarin and not NOAC

## 2016-08-15 NOTE — Telephone Encounter (Signed)
Patient has appointment in the Coumadin Clinic 4/10 to discuss switching medications.

## 2016-08-16 ENCOUNTER — Encounter: Payer: Self-pay | Admitting: Cardiology

## 2016-08-20 ENCOUNTER — Ambulatory Visit (INDEPENDENT_AMBULATORY_CARE_PROVIDER_SITE_OTHER): Payer: Medicare Other | Admitting: *Deleted

## 2016-08-20 ENCOUNTER — Telehealth: Payer: Self-pay | Admitting: Cardiology

## 2016-08-20 ENCOUNTER — Other Ambulatory Visit: Payer: Self-pay | Admitting: *Deleted

## 2016-08-20 DIAGNOSIS — I4891 Unspecified atrial fibrillation: Secondary | ICD-10-CM

## 2016-08-20 DIAGNOSIS — J90 Pleural effusion, not elsewhere classified: Secondary | ICD-10-CM

## 2016-08-20 DIAGNOSIS — J869 Pyothorax without fistula: Secondary | ICD-10-CM

## 2016-08-20 DIAGNOSIS — Z5181 Encounter for therapeutic drug level monitoring: Secondary | ICD-10-CM | POA: Diagnosis not present

## 2016-08-20 LAB — POCT INR: INR: 3.6

## 2016-08-20 MED ORDER — RIVAROXABAN 20 MG PO TABS: 20.0000 mg | ORAL_TABLET | Freq: Every day | ORAL | 1 refills | Status: DC

## 2016-08-20 NOTE — Telephone Encounter (Signed)
New message     Calling to ask what is the max number of pounds pt can gain before having to call the doctor?  Please call

## 2016-08-20 NOTE — Telephone Encounter (Signed)
Spoke with patient's wife who called to ask about weight gain. She states patient was weighed at coumadin clinic today and on 3/26 and has gained about 7 lbs. I asked if these weights were taken on the same scale and she confirms. I asked about s/s of CHF and patient denies SOB, swelling, or other concerns. Wife states patient has been eating more lately. I advised that the best way to monitor fluid weight gain is to get a scale at home and to weigh at the same time of day with the same clothing (or none). I advised her to keep a record and to call our office if weight increases 3 lb in one day or 5 lb in one week. Wife verbalized understanding and agreement and thanked me for the call. She asked about getting reorder of Senokot and I advised her to call patient's PCP. She thanked me for my help.

## 2016-09-03 ENCOUNTER — Encounter: Payer: Self-pay | Admitting: Cardiology

## 2016-09-03 ENCOUNTER — Ambulatory Visit (INDEPENDENT_AMBULATORY_CARE_PROVIDER_SITE_OTHER): Payer: Medicare Other | Admitting: Cardiology

## 2016-09-03 ENCOUNTER — Telehealth: Payer: Self-pay

## 2016-09-03 VITALS — BP 108/76 | HR 68 | Ht 71.0 in | Wt 162.0 lb

## 2016-09-03 DIAGNOSIS — I480 Paroxysmal atrial fibrillation: Secondary | ICD-10-CM

## 2016-09-03 DIAGNOSIS — I481 Persistent atrial fibrillation: Secondary | ICD-10-CM | POA: Diagnosis not present

## 2016-09-03 DIAGNOSIS — I4819 Other persistent atrial fibrillation: Secondary | ICD-10-CM

## 2016-09-03 LAB — BASIC METABOLIC PANEL
BUN / CREAT RATIO: 29 — AB (ref 10–24)
BUN: 24 mg/dL (ref 8–27)
CO2: 27 mmol/L (ref 18–29)
Calcium: 8.4 mg/dL — ABNORMAL LOW (ref 8.6–10.2)
Chloride: 100 mmol/L (ref 96–106)
Creatinine, Ser: 0.82 mg/dL (ref 0.76–1.27)
GFR calc Af Amer: 101 mL/min/{1.73_m2} (ref >59)
GFR, EST NON AFRICAN AMERICAN: 88 mL/min/{1.73_m2} (ref >59)
GLUCOSE: 64 mg/dL — AB (ref 65–99)
Potassium: 4.7 mmol/L (ref 3.5–5.2)
Sodium: 139 mmol/L (ref 134–144)

## 2016-09-03 NOTE — Patient Instructions (Signed)
Medication Instructions:  None Ordered   Labwork: Your physician recommends that you return for lab work today for BMET   Testing/Procedures: Your physician has recommended that you have a Cardioversion (DCCV). 09/19/2016. Please arrive at the [facility] at [facility] at 10 am. Electrical Cardioversion uses a jolt of electricity to your heart either through paddles or wired patches attached to your chest. This is a controlled, usually prescheduled, procedure. Defibrillation is done under light anesthesia in the hospital, and you usually go home the day of the procedure. This is done to get your heart back into a normal rhythm. You are not awake for the procedure. Please see the instruction sheet given to you today.     Follow-Up: None Ordered   Any Other Special Instructions Will Be Listed Below (If Applicable).  Please hold you lasix the day of the procedure. Continue to take your James Ivanoff daily with no interruptions. Nothing to eat or drink after midnight the night before the procedure.    If you need a refill on your cardiac medications before your next appointment, please call your pharmacy.

## 2016-09-03 NOTE — Telephone Encounter (Signed)
Spoke to [employer] at [employer]. Patient is scheduled for cardioversion procedure 09/19/2016 at 12:00 with Dr. Radford Pax. Patient was given instruction sheet with all instructions. Patient verbalized understanding. Confirmation number is [id number].

## 2016-09-03 NOTE — Progress Notes (Signed)
09/03/2016 [name]   Nov 24, 1945  [id number]  Primary Physician Wilfred Lacy, NP Primary Cardiologist:  Dr. Radford Pax   Reason for Visit/CC: Persistent Atrial Fibrillation   HPI:  [name] is a 71 y.o. male who is being seen today for presistent atrial fibrillation. He also has a h/o COPD, tobacco abuse, centrilobular emphysema s/p VATS with bx of subcarinal mass, moderate pulmonary HTN, dilated aortic root and chronic diastolic CHF. He was iinitially placed on coumadin however was transitioned to Xarelto 08/20/16. He had a NST 08/08/16 that was low risk for ischemia. EF normal at 58%.  He presents back to clinic for f/u for his afib. Dr. Radford Pax outlined in her last office note to set up for DCCV after being on DOAC x 4 weeks. He is here today with his wife. EKG shows persistent atrial fibrillation with a CVR of 68 bpm. He remains symptomatic with fatigue. BP is stable. No syncope/ near syncope. He reports full compliance with Xarelto since starting it 08/20/16. Tolerating well. No adverse bleeding.   He does note increased bilateral LEE. He denies exertional dyspnea, orthopnea or PND. He only takes Lasix 3 times/ week (MWF). Also on spironolactone daily. He eats a low sodium diet.   Current Meds  Medication Sig  . Ascorbic Acid (VITAMIN C) 1000 MG tablet Take 1,000 mg by mouth 2 (two) times daily.  . diphenhydrAMINE (BENADRYL ALLERGY) 25 MG tablet Take 1 tablet (25 mg total) by mouth at bedtime as needed for sleep.  . furosemide (LASIX) 40 MG tablet Take 1 tablet (40 mg total) by mouth every Monday, Wednesday, and Friday.  . Probiotic Product (SOLUBLE FIBER/PROBIOTICS PO) Take 2 capsules by mouth daily.  . rivaroxaban (XARELTO) 20 MG TABS tablet Take 1 tablet (20 mg total) by mouth daily with supper.  . senna-docusate (SENOKOT-S) 8.6-50 MG tablet Take 1 tablet by mouth at bedtime as needed for mild constipation.  [name] spironolactone (ALDACTONE) 25 MG tablet Take 1 tablet (25 mg  total) by mouth daily.  . traMADol (ULTRAM) 50 MG tablet Take 1 tablet (50 mg total) by mouth every 6 (six) hours as needed.  . warfarin (COUMADIN) 4 MG tablet Take 1 tablet (4 mg total) by mouth daily at 6 PM. Or as directed by Coumadin Clinic   Allergies  Allergen Reactions  . Demerol [Meperidine] Other (See Comments)    UNSPECIFIED REACTION  Causes system to shutdown. ?   . Oxycodone Hcl Other (See Comments)    Pt states this medication 'wires him up' and makes pt hyper; pt does not want to take this ever again   Past Medical History:  Diagnosis Date  . Arthritis    "neck" (08/15/2015)  . Chronic bronchitis ([place])   . Chronic diastolic CHF (congestive heart failure) ([place])   . Constipation   . COPD (chronic obstructive pulmonary disease) ([place])   . DDD (degenerative disc disease), cervical   . Dilated aortic root ([place])    53m on echo 05/2016  . Edema of left lower extremity   . Facial basal cell cancer   . H/O acne vulgaris 1960s   [facility]  . Headache    history of - left temporal- years ago- not current (08/15/2015)  . Persistent atrial fibrillation (HCC)    on coumadin with CHADS2VASC score of 2  . Pneumonia 1960s; 2015 X 2  . Prostate cancer ([place] early 2000[place]   "low spreading;  non aggressive type" (08/15/2015)  . Pulmonary HTN ([place])    PASP 25mHg by echo 05/2016  . Skin cancer    "back"   Family History  Problem Relation Age of Onset  . Cancer Mother     colon cancer  . Alcoholism Father   . CAD Father   . Alcohol abuse Father   . Ovarian cancer Sister   . Diabetes Neg Hx    Past Surgical History:  Procedure Laterality Date  . COLONOSCOPY    . EXCISIONAL HEMORRHOIDECTOMY  1960s  . INGUINAL HERNIA REPAIR Right 2002  . INGUINAL HERNIA REPAIR Bilateral 08/15/2015  . INGUINAL HERNIA REPAIR Bilateral 08/15/2015   Procedure: OPEN REPAIR RECURRENT RIGHT INGUINAL HERNIA WITH MESH AND REPAIR LEFT INGUINAL HERNIA WITH MESH;   Surgeon: HFanny Skates MD;  [place] [facility]  Service: General;  Laterality: Bilateral;  . INSERTION OF MESH Bilateral 08/15/2015   Procedure: INSERTION OF MESH;  Surgeon: HFanny Skates MD;  [place] [facility]  Service: General;  Laterality: Bilateral;  . PROSTATE BIOPSY    . TONSILLECTOMY  1950s  . VIDEO ASSISTED THORACOSCOPY (VATS)/EMPYEMA Right 05/20/2016   Procedure: VIDEO ASSISTED THORACOSCOPY (VATS) with drainage of pleural effusion;  Surgeon: PIvin Poot MD;  [place]  Service: Thoracic;  Laterality: Right;  .Marland KitchenVIDEO BRONCHOSCOPY WITH ENDOBRONCHIAL ULTRASOUND Right 05/20/2016   Procedure: VIDEO BRONCHOSCOPY WITH ENDOBRONCHIAL ULTRASOUND;  Surgeon: PIvin Poot MD;  [facility]  Service: Thoracic;  Laterality: Right;   Social History   Social History  . Marital status: Married    Spouse name: N/A  . Number of children: N/A  . Years of education: N/A   Occupational History  . Not on file.   Social History Main Topics  . Smoking status: Former Smoker    Packs/day: 1.00    Years: 62.00    Types: Cigarettes    Quit date: 05/14/2016  . Smokeless tobacco: Never Used  . Alcohol use No  . Drug use: No  . Sexual activity: Not Currently   Other Topics Concern  . Not on file   Social History Narrative  . No narrative on file     Review of Systems: General: negative for chills, fever, night sweats or weight changes.  Cardiovascular: negative for chest pain, dyspnea on exertion, edema, orthopnea, palpitations, paroxysmal nocturnal dyspnea or shortness of breath Dermatological: negative for rash Respiratory: negative for cough or wheezing Urologic: negative for hematuria Abdominal: negative for nausea, vomiting, diarrhea, bright red blood per rectum, melena, or hematemesis Neurologic: negative for visual changes, syncope, or dizziness All other systems reviewed and are otherwise negative except as noted above.   Physical Exam:  Blood pressure 118/72, height  _0  (1.803 m), weight 162 lb (73.5 kg).  General appearance: alert, cooperative, no distress and tall and thin Neck: no carotid bruit and no JVD Lungs: clear to auscultation bilaterally Heart: irregularly irregular rhythm Extremities: 2+ bilateral LEE Pulses: 2+ and symmetric Skin: Skin color, texture, turgor normal. No rashes or lesions Neurologic: Grossly normal  EKG atrial fibrillation 68 bpm -- personally reviewed   ASSESSMENT AND PLAN:   1. Persistent atrial fibrillation with a controlled ventricular response: Rate stable at 68 bpm. He is symptomatic with exertional fatigue. NST 07/2016 negative for ischemia. Plan is for DCCV in 2 more weeks. Recently started Xarelto 08/20/16. Needs to be on DOAC x 4 weeks prior to cardioversion. Reports full compliance with this. He was instructed to continue daily uninterrupted use  of Xarelto. We will arrange cardioversion with Dr. Radford Pax on 09/19/2016 at [facility].   2. Acute on chronic diastolic heart failure: 2+ bilateral lower extremity edema on exam however he denies any associated dyspnea, orthopnea or PND. Currently on Lasix and spironolactone however he only takes Lasix 3 times a day on Monday, Wednesday Friday. He needs additional diuresis. We will check a BMP today to ensure that renal function and potassium levels are okay. If stable, we will have him increase his Lasix to 40 mg daily. He was instructed to continue daily weights at home and to monitor for any worsening edema or development of dyspnea. Low-sodium diet also advised.   3. Moderate pulmonary HTN: PASP is 93mHg and likely secondary to group 2 with pulmonary venous HTN as well group 3 from PNA and effusion at time of last echo.  Per Dr. TRadford Pax will repeat echo 05/2017 to make sure it has not progressed.  4. Dilated aortic root: Per. Dr. TRadford Pax repeat echo in 05/2017 to assess for progression. He needs aggressive control of BP.  BP is stable at 118/72 today.    PLAN   Increase Lasix for acute on chronic diastolic HF. DCCV for persistent afib in 2 weeks. F/u in clinic 1 week post cardioversion for repeat EKG.    Brittainy SLadoris Gene [facility] 09/03/2016 10:21 AM

## 2016-09-04 ENCOUNTER — Telehealth: Payer: Self-pay | Admitting: Cardiology

## 2016-09-04 DIAGNOSIS — I5032 Chronic diastolic (congestive) heart failure: Secondary | ICD-10-CM

## 2016-09-04 MED ORDER — FUROSEMIDE 40 MG PO TABS: 40.0000 mg | ORAL_TABLET | Freq: Every day | ORAL | 3 refills | Status: DC

## 2016-09-04 NOTE — Telephone Encounter (Signed)
New Message     Pt wife is calling for the lab results from yesterday

## 2016-09-04 NOTE — Telephone Encounter (Signed)
Notes recorded by Consuelo Pandy, PA-C on 09/03/2016 at 4:42 PM EDT Renal function and K level both normal. Increase lasix to daily use. Repeat BMP in 1 week.  Informed the pt and wife of pts lab results as mentioned above, per Ellen Henri PA-C.  Sent refills of lasix 40 mg po daily to pts confirmed pharmacy of choice.  Pt is scheduled for lab appt to recheck a bmet in one week, on 09/12/16.  Both verbalized understanding and agrees with this plan.

## 2016-09-12 ENCOUNTER — Other Ambulatory Visit: Payer: Medicare Other | Admitting: *Deleted

## 2016-09-12 DIAGNOSIS — I5032 Chronic diastolic (congestive) heart failure: Secondary | ICD-10-CM

## 2016-09-13 ENCOUNTER — Telehealth: Payer: Self-pay | Admitting: Cardiology

## 2016-09-13 DIAGNOSIS — I482 Chronic atrial fibrillation, unspecified: Secondary | ICD-10-CM

## 2016-09-13 LAB — BASIC METABOLIC PANEL
BUN / CREAT RATIO: 17 (ref 10–24)
BUN: 17 mg/dL (ref 8–27)
CHLORIDE: 101 mmol/L (ref 96–106)
CO2: 27 mmol/L (ref 18–29)
Calcium: 8.5 mg/dL — ABNORMAL LOW (ref 8.6–10.2)
Creatinine, Ser: 1.02 mg/dL (ref 0.76–1.27)
GFR calc non Af Amer: 73 mL/min/{1.73_m2} (ref >59)
GFR, EST AFRICAN AMERICAN: 84 mL/min/{1.73_m2} (ref >59)
GLUCOSE: 73 mg/dL (ref 65–99)
POTASSIUM: 4.9 mmol/L (ref 3.5–5.2)
Sodium: 144 mmol/L (ref 134–144)

## 2016-09-13 NOTE — Telephone Encounter (Signed)
Pt is having a Cardioversion and on Xarelto, was told he can't miss a dose, and he missed one last night-pls advise

## 2016-09-13 NOTE — Telephone Encounter (Signed)
Confirmed with patient he only skipped one dose of Xarelto and that was yesterday. He is scheduled to have DCCV 5/10. He understands Dr. Radford Pax will be consulted and he will be called to reschedule DCCV on Monday if she deems necessary.   Coumadin Clinic FYI- cancelled 1 mo Xarelto start 5/8 per patient request. He had recent BMET and will need to come back for more blood work (CBC, BMET).   To Dr. Radford Pax.

## 2016-09-14 NOTE — Telephone Encounter (Signed)
Need to cancel DCCV for 5/10 as he missed a does of Xarelt

## 2016-09-16 NOTE — Telephone Encounter (Signed)
Pt aware that we had to reschedule his Cardioversion due to missing a dose of his Xarelto. He is scheduled for 10/11/16 with Dr. Sallyanne Kuster, and pt will come back for lab work 10/04/16.  Pt has been made aware of this and verbalized appreciation and understanding.

## 2016-09-16 NOTE — Telephone Encounter (Signed)
-----  Message from [name], MD sent at 09/16/2016  8:51 AM EDT ----- Need to wait 3 weeks to reschedule  Traci ----- Message ----- From: Jeanann Lewandowsky, RMA Sent: 09/16/2016   8:43 AM To: [name], MD  Pt is scheduled for a DCCV with you this Thursday, 09/19/16, but he missed 1 dose of Xarelto on Thursday, 09/13/16. Does pt need to reschedule and what length of time? Please advise!  Thanks!

## 2016-09-17 ENCOUNTER — Ambulatory Visit (INDEPENDENT_AMBULATORY_CARE_PROVIDER_SITE_OTHER): Payer: Medicare Other | Admitting: Nurse Practitioner

## 2016-09-17 ENCOUNTER — Other Ambulatory Visit (INDEPENDENT_AMBULATORY_CARE_PROVIDER_SITE_OTHER): Payer: Medicare Other

## 2016-09-17 ENCOUNTER — Encounter: Payer: Self-pay | Admitting: Nurse Practitioner

## 2016-09-17 VITALS — BP 96/66 | HR 75 | Temp 97.4°F | Ht 71.0 in | Wt 152.0 lb

## 2016-09-17 DIAGNOSIS — I4819 Other persistent atrial fibrillation: Secondary | ICD-10-CM

## 2016-09-17 DIAGNOSIS — D649 Anemia, unspecified: Secondary | ICD-10-CM | POA: Diagnosis not present

## 2016-09-17 DIAGNOSIS — G4700 Insomnia, unspecified: Secondary | ICD-10-CM | POA: Diagnosis not present

## 2016-09-17 DIAGNOSIS — E43 Unspecified severe protein-calorie malnutrition: Secondary | ICD-10-CM

## 2016-09-17 DIAGNOSIS — I481 Persistent atrial fibrillation: Secondary | ICD-10-CM

## 2016-09-17 LAB — CBC WITH DIFFERENTIAL/PLATELET
Basophils Absolute: 0.1 10*3/uL (ref 0.0–0.1)
Basophils Relative: 1.5 % (ref 0.0–3.0)
EOS PCT: 1 % (ref 0.0–5.0)
Eosinophils Absolute: 0.1 10*3/uL (ref 0.0–0.7)
HCT: 43.2 % (ref 39.0–52.0)
Hemoglobin: 14.1 g/dL (ref 13.0–17.0)
LYMPHS ABS: 2.2 10*3/uL (ref 0.7–4.0)
Lymphocytes Relative: 27 % (ref 12.0–46.0)
MCHC: 32.6 g/dL (ref 30.0–36.0)
MCV: 92.3 fl (ref 78.0–100.0)
MONO ABS: 1.2 10*3/uL — AB (ref 0.1–1.0)
Monocytes Relative: 14.8 % — ABNORMAL HIGH (ref 3.0–12.0)
NEUTROS ABS: 4.6 10*3/uL (ref 1.4–7.7)
NEUTROS PCT: 55.7 % (ref 43.0–77.0)
PLATELETS: 400 10*3/uL (ref 150.0–400.0)
RBC: 4.67 Mil/uL (ref 4.22–5.81)
RDW: 14.6 % (ref 11.5–15.5)
WBC: 8.3 10*3/uL (ref 4.0–10.5)

## 2016-09-17 LAB — IRON AND TIBC
%SAT: 25 % (ref 15–60)
Iron: 59 ug/dL (ref 50–180)
TIBC: 238 ug/dL — AB (ref 250–425)
UIBC: 179 ug/dL (ref 125–400)

## 2016-09-17 LAB — FERRITIN: FERRITIN: 148.7 ng/mL (ref 22.0–322.0)

## 2016-09-17 LAB — MAGNESIUM: MAGNESIUM: 1.9 mg/dL (ref 1.5–2.5)

## 2016-09-17 MED ORDER — MIRTAZAPINE 15 MG PO TABS: 15.0000 mg | ORAL_TABLET | Freq: Every day | ORAL | 1 refills | Status: DC

## 2016-09-17 NOTE — Patient Instructions (Signed)
Resume use of ensure and/or muscle milk.  Encourage adequate oral hydration.  Encourage small frequent meals.

## 2016-09-17 NOTE — Progress Notes (Signed)
Pre visit review using our clinic review tool, if applicable. No additional management support is needed unless otherwise documented below in the visit note. 

## 2016-09-17 NOTE — Progress Notes (Signed)
Subjective:  Patient ID: [name], male    DOB: 1945-06-05  Age: 71 y.o. MRN: [id number]  CC: Follow-up (3 mo fu/ lossing weight,still try to recover from operation. )   HPI  Insomnia: No improvement with benadryl. Difficulty staying asleep due to incision discomfort.  Weight Loss: Wife states he had gained up to 170Lbs while taking ensure and muscle milk. Appetite has not improved. Denies any GI/GU symptoms. Also complains of generalized weakness.  A-fib: Persistent Scheduled cardioversion 10/2016. Persistent incision discomfort which makes it difficult to sleep at night. Resolved LE edema.  Outpatient Medications Prior to Visit  Medication Sig Dispense Refill  . Ascorbic Acid (VITAMIN C) 1000 MG tablet Take 1,000 mg by mouth 2 (two) times daily.    . diphenhydrAMINE (BENADRYL ALLERGY) 25 MG tablet Take 1 tablet (25 mg total) by mouth at bedtime as needed for sleep. 30 tablet 0  . furosemide (LASIX) 40 MG tablet Take 1 tablet (40 mg total) by mouth daily. 30 tablet 3  . Probiotic Product (SOLUBLE FIBER/PROBIOTICS PO) Take 2 capsules by mouth daily.    . rivaroxaban (XARELTO) 20 MG TABS tablet Take 1 tablet (20 mg total) by mouth daily with supper. 30 tablet 1  . senna-docusate (SENOKOT-S) 8.6-50 MG tablet Take 1 tablet by mouth at bedtime as needed for mild constipation. 30 tablet 0  . spironolactone (ALDACTONE) 25 MG tablet Take 1 tablet (25 mg total) by mouth daily. 30 tablet 3  . traMADol (ULTRAM) 50 MG tablet Take 1 tablet (50 mg total) by mouth every 6 (six) hours as needed. 30 tablet 0  . warfarin (COUMADIN) 4 MG tablet Take 1 tablet (4 mg total) by mouth daily at 6 PM. Or as directed by Coumadin Clinic (Patient not taking: Reported on 09/17/2016) 40 tablet 1   No facility-administered medications prior to visit.     ROS See HPI  Objective:  BP 96/66   Pulse 75   Temp 97.4 F (36.3 C)   Ht _0  (1.803 m)   Wt 152 lb (68.9 kg)   SpO2 98%   BMI 21.20  kg/m   BP Readings from Last 3 Encounters:  09/17/16 96/66  09/03/16 108/76  08/05/16 92/70    Wt Readings from Last 3 Encounters:  09/17/16 152 lb (68.9 kg)  09/03/16 162 lb (73.5 kg)  08/20/16 168 lb 6.4 oz (76.4 kg)    Physical Exam  Constitutional: He is oriented to person, place, and time. No distress.  Neck: Normal range of motion. Neck supple.  Cardiovascular: Normal rate and normal heart sounds.   Pulmonary/Chest: Effort normal and breath sounds normal. No respiratory distress. He has no wheezes.  Abdominal: Soft. Bowel sounds are normal. He exhibits no distension. There is no tenderness.  Musculoskeletal: Normal range of motion. He exhibits no edema.  Lymphadenopathy:    He has no cervical adenopathy.  Neurological: He is alert and oriented to person, place, and time.  Skin: Skin is warm and dry. He is not diaphoretic.  Vitals reviewed.   Lab Results  Component Value Date   WBC 8.3 09/17/2016   HGB 14.1 09/17/2016   HCT 43.2 09/17/2016   PLT 400.0 09/17/2016   GLUCOSE 73 09/12/2016   ALT 29 06/08/2016   AST 30 06/08/2016   NA 144 09/12/2016   K 4.9 09/12/2016   CL 101 09/12/2016   CREATININE 1.02 09/12/2016   BUN 17 09/12/2016   CO2 27 09/12/2016   TSH 1.187 05/14/2016  INR 3.6 08/20/2016    Dg Chest 2 View  Result Date: 07/10/2016 CLINICAL DATA:  Pleural effusion, thoracoscopy for drainage of pleural effusion 05/20/2016. Right chest soreness EXAM: CHEST  2 VIEW COMPARISON:  06/12/2016 FINDINGS: Small right pleural effusion which has increased compared with 06/12/2016. Trace left pleural effusion. Airspace opacity in the right upper lobe along the major fissure concerning for pneumonia versus loculated pleural fluid. No other focal parenchymal opacity. No pneumothorax. Stable cardiomediastinal silhouette. Dextroscoliosis of the thoracolumbar spine. IMPRESSION: Small right pleural effusion which has increased compared with 06/12/2016. Trace left pleural  effusion. Airspace opacity in the right upper lobe along the major fissure concerning for pneumonia versus loculated pleural fluid. Electronically Signed   By: Kathreen Devoid   On: 07/10/2016 11:57    Assessment & Plan:   Lashun was seen today for follow-up.  Diagnoses and all orders for this visit:  Insomnia, unspecified type -     mirtazapine (REMERON) 15 MG tablet; Take 1 tablet (15 mg total) by mouth at bedtime.  Anemia, unspecified type -     CBC w/Diff; Future -     Ferritin; Future -     Iron Binding Cap (TIBC); Future  Protein-calorie malnutrition, severe (HCC)  Persistent atrial fibrillation (HCC) -     Magnesium; Future   I have discontinued Mr. Devol's warfarin. I am also having him start on mirtazapine. Additionally, I am having him maintain his vitamin C, senna-docusate, traMADol, diphenhydrAMINE, Probiotic Product (SOLUBLE FIBER/PROBIOTICS PO), spironolactone, rivaroxaban, and furosemide.  Meds ordered this encounter  Medications  . mirtazapine (REMERON) 15 MG tablet    Sig: Take 1 tablet (15 mg total) by mouth at bedtime.    Dispense:  30 tablet    Refill:  1    Order Specific Question:   Supervising Provider    Answer:   Cassandria Anger [[id number]]    Follow-up: Return in about 1 month (around 10/18/2016) for insomnia, anemia and weight.  Wilfred Lacy, NP

## 2016-09-24 ENCOUNTER — Other Ambulatory Visit: Payer: Self-pay | Admitting: Cardiothoracic Surgery

## 2016-09-24 DIAGNOSIS — G8918 Other acute postprocedural pain: Secondary | ICD-10-CM

## 2016-09-26 ENCOUNTER — Telehealth: Payer: Self-pay | Admitting: *Deleted

## 2016-09-26 DIAGNOSIS — G8918 Other acute postprocedural pain: Secondary | ICD-10-CM

## 2016-09-26 MED ORDER — TRAMADOL HCL 50 MG PO TABS
50.0000 mg | ORAL_TABLET | Freq: Four times a day (QID) | ORAL | 0 refills | Status: DC | PRN
Start: 2016-09-26 — End: 2016-11-01

## 2016-09-26 NOTE — Telephone Encounter (Signed)
Pt's spouse is aware, rx faxed to [facility].

## 2016-09-26 NOTE — Telephone Encounter (Signed)
Wife left msg on triage requesting refill on husband Tramadol...[name]

## 2016-10-02 ENCOUNTER — Ambulatory Visit (INDEPENDENT_AMBULATORY_CARE_PROVIDER_SITE_OTHER): Payer: Medicare Other | Admitting: Cardiothoracic Surgery

## 2016-10-02 ENCOUNTER — Ambulatory Visit
Admission: RE | Admit: 2016-10-02 | Discharge: 2016-10-02 | Disposition: A | Discharge status: Home / Self Care | Payer: Medicare Other | Source: Ambulatory Visit | Attending: Cardiothoracic Surgery | Admitting: Cardiothoracic Surgery

## 2016-10-02 ENCOUNTER — Other Ambulatory Visit: Payer: Self-pay | Admitting: *Deleted

## 2016-10-02 ENCOUNTER — Other Ambulatory Visit: Payer: Medicare Other

## 2016-10-02 VITALS — BP 94/57 | HR 69 | Resp 16 | Ht 71.0 in | Wt 147.4 lb

## 2016-10-02 DIAGNOSIS — J869 Pyothorax without fistula: Secondary | ICD-10-CM

## 2016-10-02 DIAGNOSIS — R590 Localized enlarged lymph nodes: Secondary | ICD-10-CM

## 2016-10-02 DIAGNOSIS — J90 Pleural effusion, not elsewhere classified: Secondary | ICD-10-CM | POA: Diagnosis not present

## 2016-10-02 DIAGNOSIS — Z09 Encounter for follow-up examination after completed treatment for conditions other than malignant neoplasm: Secondary | ICD-10-CM | POA: Diagnosis not present

## 2016-10-02 NOTE — Progress Notes (Signed)
PCP is Nche, Charlene Brooke, NP Referring Provider is Juanito Doom, MD  Chief Complaint  Patient presents with  . Routine Post Op    3 month with CT CHEST for f/u L empyema/effusions    HPI:The patient returns for follow-up. In January of this year he underwent bronchoscopy, transbronchial biopsy of mediastinal lymph node showing inflammation, right VATS and drainage and decortication of right empyema. Pathologic material was negative for malignancy.  The patient has successfully stopped smoking. His CT scan shows resolution of the empyema. He has evidence of old granulomatous disease of his lungs and mediastinum. No discrete pulmonary masses. He continues however to lose weight and  have problems with fatigue. He has some postthoracotomy pain. He has atrial fibrillation scheduled for cardioversion by cardiology. Echocardiogram shows normal LV systolic function but RV is dilated with probable associated pulmonary hypertension from his significant COPD. His weight is decreased from 160-148 over the past 3 months  Patient has history of carcinoma prostate which has been not treated but followed by urologist in [place]. He states his last PSA was 3.0 but we have no records.  Past Medical History:  Diagnosis Date  . Arthritis    "neck" (08/15/2015)  . Chronic bronchitis ([place])   . Chronic diastolic CHF (congestive heart failure) ([place])   . Constipation   . COPD (chronic obstructive pulmonary disease) ([place])   . DDD (degenerative disc disease), cervical   . Dilated aortic root ([place])    30m on echo 05/2016  . Edema of left lower extremity   . Facial basal cell cancer   . H/O acne vulgaris 1960s   [facility]"  . Headache    history of - left temporal- years ago- not current (08/15/2015)  . Persistent atrial fibrillation ([facility])    on coumadin with CHADS2VASC score of 2  . Pneumonia 1960s; 2015 X 2  . [place]   "low spreading; non aggressive type" (08/15/2015)  . [place]monary HTN [place]    PASP 428mg by echo 05/2016  . Skin cancer    "back"    Past Surgical History:  Procedure Laterality Date  . COLONOSCOPY    . EXCISIONAL HEMORRHOIDECTOMY  1960s  . INGUINAL HERNIA REPAIR Right 2002  . INGUINAL HERNIA REPAIR Bilateral 08/15/2015  . INGUINAL HERNIA REPAIR Bilateral 08/15/2015   Procedure: OPEN REPAIR RECURRENT RIGHT INGUINAL HERNIA WITH MESH AND REPAIR LEFT INGUINAL HERNIA WITH MESH;  Surgeon: HaFanny SkatesMD;  [facility];  Laterality: Bilateral;  . INSERTION OF MESH Bilateral 08/15/2015   Procedure: INSERTION OF MESH;  Surgeon: HaFanny SkatesMD;  [facility];  Laterality: Bilateral;  . PROSTATE BIOPSY    . TONSILLECTOMY  1950s  . VIDEO ASSISTED THORACOSCOPY (VATS)/EMPYEMA Right 05/20/2016   Procedure: VIDEO ASSISTED THORACOSCOPY (VATS) with drainage of pleural effusion;  Surgeon: PeIvin PootMD;  [facility];  Laterality: Right;  . Marland KitchenIDEO BRONCHOSCOPY WITH ENDOBRONCHIAL ULTRASOUND Right 05/20/2016   Procedure: VIDEO BRONCHOSCOPY WITH ENDOBRONCHIAL ULTRASOUND;  Surgeon: PeIvin PootMD;  [facility];  Service: Thoracic;  Laterality: Right;    Family History  Problem Relation Age of Onset  . Cancer Mother        colon cancer  . Alcoholism Father   . CAD Father   . Alcohol abuse Father   . Ovarian cancer Sister   . Diabetes  Neg Hx     Social History Social History  Substance Use Topics  . Smoking status: Former Smoker    Packs/day: 1.00    Years: 62.00    Types: Cigarettes    Quit date: 05/14/2016  . Smokeless tobacco: Never Used  . Alcohol use No    Current Outpatient Prescriptions  Medication Sig Dispense Refill  . Ascorbic Acid (VITAMIN C) 1000 MG tablet Take 1,000 mg by mouth 2 (two) times daily.    . furosemide (LASIX) 40 MG tablet Take 1 tablet (40 mg total) by mouth daily. 30 tablet 3  . Probiotic  Product (SOLUBLE FIBER/PROBIOTICS PO) Take 2 capsules by mouth daily.    . rivaroxaban (XARELTO) 20 MG TABS tablet Take 1 tablet (20 mg total) by mouth daily with supper. 30 tablet 1  . spironolactone (ALDACTONE) 25 MG tablet Take 1 tablet (25 mg total) by mouth daily. 30 tablet 3  . traMADol (ULTRAM) 50 MG tablet Take 1 tablet (50 mg total) by mouth every 6 (six) hours as needed. 20 tablet 0   No current facility-administered medications for this visit.     Allergies  Allergen Reactions  . Demerol [Meperidine] Other (See Comments)    UNSPECIFIED REACTION  Causes system to shutdown. ?   . Oxycodone Hcl Other (See Comments)    Pt states this medication 'wires him up' and makes pt hyper; pt does not want to take this ever again    Review of Systems  No headache or change in vision No difficulty swallowing No angina or palpitations Postthoracotomy pain from right VATS January 2018 No abdominal pain or change in bowel habits Lower extremity edema significantly improved on diuretic therapy No blood per rectum or hematuria BP (!) 94/57 (BP Location: Right Arm, Patient Position: Sitting, Cuff Size: Normal)   Pulse 69   Resp 16   Ht _0  (1.803 m)   Wt 147 lb 6.4 oz (66.9 kg)   SpO2 97% Comment: ON RA  BMI 20.56 kg/m  Physical Exam      Exam    General- alert and comfortable but appears fragile and chronically ill   Lungs- clear without rales, wheezes   Neck with bilateral posterior cervical firm lymph nodes, no supralevator nodes noted. No axillary nodes palpable   Cor- irregular rate and rhythm, no murmur , gallop   Abdomen- soft, non-tender   Extremities - warm, non-tender, minimal edema   Neuro- oriented, appropriate, no focal weakness   Diagnostic Tests: CT scan shows resolution of right empyema with small pleural effusion on right versus pleural thickening No suspicious pulmonary parenchymal masses but with significant changes of COPD. Mediastinal adenopathy, mainly  in the subcarinal region which was recently biopsied showing inflammation at the time of surgery for his VATS  Impression: Continued unexplained weight loss Enlarged mediastinal nodes and firm lateral cervical nodes I recommended cervical lymph node biopsy to the patient. This will be scheduled after his previously scheduled DC cardioversion. He understands he will stop his Xarelto 2 days before the biopsy procedure  Plan: Return after biopsy to review pathology. If results are unrevealing then we'll proceed with PET scan although PET scan results will be altered by his recent right thoracotomy   Len Childs, MD [facility] [phone]

## 2016-10-04 ENCOUNTER — Other Ambulatory Visit: Payer: Medicare Other | Admitting: *Deleted

## 2016-10-04 DIAGNOSIS — I482 Chronic atrial fibrillation, unspecified: Secondary | ICD-10-CM

## 2016-10-05 LAB — BASIC METABOLIC PANEL
BUN / CREAT RATIO: 33 — AB (ref 10–24)
BUN: 31 mg/dL — ABNORMAL HIGH (ref 8–27)
CO2: 25 mmol/L (ref 18–29)
Calcium: 8.7 mg/dL (ref 8.6–10.2)
Chloride: 99 mmol/L (ref 96–106)
Creatinine, Ser: 0.94 mg/dL (ref 0.76–1.27)
GFR calc Af Amer: 93 mL/min/{1.73_m2} (ref >59)
GFR calc non Af Amer: 80 mL/min/{1.73_m2} (ref >59)
GLUCOSE: 86 mg/dL (ref 65–99)
Potassium: 5.4 mmol/L — ABNORMAL HIGH (ref 3.5–5.2)
SODIUM: 138 mmol/L (ref 134–144)

## 2016-10-05 LAB — CBC
HEMOGLOBIN: 13.8 g/dL (ref 13.0–17.7)
Hematocrit: 41.5 % (ref 37.5–51.0)
MCH: 29.9 pg (ref 26.6–33.0)
MCHC: 33.3 g/dL (ref 31.5–35.7)
MCV: 90 fL (ref 79–97)
PLATELETS: 469 10*3/uL — AB (ref 150–379)
RBC: 4.61 x10E6/uL (ref 4.14–5.80)
RDW: 14.9 % (ref 12.3–15.4)
WBC: 9.3 10*3/uL (ref 3.4–10.8)

## 2016-10-05 LAB — PROTIME-INR
INR: 1.1 (ref 0.8–1.2)
PROTHROMBIN TIME: 11.9 s (ref 9.1–12.0)

## 2016-10-08 ENCOUNTER — Telehealth: Payer: Self-pay | Admitting: Cardiology

## 2016-10-08 NOTE — Telephone Encounter (Signed)
Returned pts call re: lab results and he was advised that we don't have his lab results ready at this time due to the office being closed for [facility] and it would probably be tomorrow before we got them in and I would call him back.  Pt states that he has been dealing with an episode for the past 2 days that he don't have much of an appetite at all.  He has been able to eat small things, bananas, muscle milk, couple of times a day.  When he tried the "hard food", Pork Ribs, potatoes, etc... He got sick and felt like he had to throw up, but hasn't eaten much at all, so he couldn't.  He was advised to contact his pcp, and to keep in as much as he can tolerate, bananas, muscle milk, whatever his stomach can tolerate to keep from getting dehydrated.  Pt denies cp, sob, but does have a hx of afib, and is scheduled for DCCV 10/11/16, it is important to not get dehydrated.  Pt verbalized appreciation for the call and advises that he will contact his pcp in the meantime.  Please advise!

## 2016-10-08 NOTE — Telephone Encounter (Signed)
New message    Pt is calling for his lab results from Friday.

## 2016-10-09 ENCOUNTER — Ambulatory Visit: Payer: Medicare Other | Admitting: Nurse Practitioner

## 2016-10-10 NOTE — Telephone Encounter (Signed)
-----  Message from Consuelo Pandy, [place] sent at 10/10/2016  1:32 PM EDT ----- K level is a bit elevated. This may be due to eating more bananas here recently. Recommend that he eat less bananas today. Hold spironolactone today. Recheck BMP tomorrow at [facility] when he goes for cardioversion. All other labs are ok.

## 2016-10-10 NOTE — Telephone Encounter (Signed)
Follow Up   Pt wife calling back about the lab results and states that they were supposed to get a call yesterday but never did.

## 2016-10-11 ENCOUNTER — Ambulatory Visit (HOSPITAL_COMMUNITY)
Admission: RE | Admit: 2016-10-11 | Discharge: 2016-10-11 | Disposition: A | Discharge status: Home / Self Care | Payer: Medicare Other | Source: Ambulatory Visit | Attending: Cardiology | Admitting: Cardiology

## 2016-10-11 ENCOUNTER — Ambulatory Visit (HOSPITAL_COMMUNITY): Payer: Medicare Other | Admitting: Certified Registered Nurse Anesthetist

## 2016-10-11 ENCOUNTER — Encounter (HOSPITAL_COMMUNITY)
Admission: RE | Disposition: A | Discharge status: Home / Self Care | Payer: Self-pay | Source: Ambulatory Visit | Attending: Cardiology

## 2016-10-11 ENCOUNTER — Encounter (HOSPITAL_COMMUNITY): Payer: Self-pay | Admitting: Emergency Medicine

## 2016-10-11 DIAGNOSIS — Z885 Allergy status to narcotic agent status: Secondary | ICD-10-CM | POA: Insufficient documentation

## 2016-10-11 DIAGNOSIS — I5032 Chronic diastolic (congestive) heart failure: Secondary | ICD-10-CM | POA: Insufficient documentation

## 2016-10-11 DIAGNOSIS — J90 Pleural effusion, not elsewhere classified: Secondary | ICD-10-CM | POA: Diagnosis not present

## 2016-10-11 DIAGNOSIS — I272 Pulmonary hypertension, unspecified: Secondary | ICD-10-CM | POA: Insufficient documentation

## 2016-10-11 DIAGNOSIS — Z8249 Family history of ischemic heart disease and other diseases of the circulatory system: Secondary | ICD-10-CM | POA: Diagnosis not present

## 2016-10-11 DIAGNOSIS — Z811 Family history of alcohol abuse and dependence: Secondary | ICD-10-CM | POA: Insufficient documentation

## 2016-10-11 DIAGNOSIS — Z7901 Long term (current) use of anticoagulants: Secondary | ICD-10-CM | POA: Diagnosis not present

## 2016-10-11 DIAGNOSIS — Z8041 Family history of malignant neoplasm of ovary: Secondary | ICD-10-CM | POA: Insufficient documentation

## 2016-10-11 DIAGNOSIS — Z79899 Other long term (current) drug therapy: Secondary | ICD-10-CM | POA: Insufficient documentation

## 2016-10-11 DIAGNOSIS — R918 Other nonspecific abnormal finding of lung field: Secondary | ICD-10-CM | POA: Diagnosis not present

## 2016-10-11 DIAGNOSIS — M47812 Spondylosis without myelopathy or radiculopathy, cervical region: Secondary | ICD-10-CM | POA: Diagnosis not present

## 2016-10-11 DIAGNOSIS — I481 Persistent atrial fibrillation: Secondary | ICD-10-CM

## 2016-10-11 DIAGNOSIS — I4819 Other persistent atrial fibrillation: Secondary | ICD-10-CM

## 2016-10-11 DIAGNOSIS — M5030 Other cervical disc degeneration, unspecified cervical region: Secondary | ICD-10-CM | POA: Insufficient documentation

## 2016-10-11 DIAGNOSIS — Z8546 Personal history of malignant neoplasm of prostate: Secondary | ICD-10-CM | POA: Diagnosis not present

## 2016-10-11 DIAGNOSIS — Z800 Family history of malignant neoplasm of digestive organs: Secondary | ICD-10-CM | POA: Diagnosis not present

## 2016-10-11 DIAGNOSIS — Z85828 Personal history of other malignant neoplasm of skin: Secondary | ICD-10-CM | POA: Insufficient documentation

## 2016-10-11 DIAGNOSIS — Z87891 Personal history of nicotine dependence: Secondary | ICD-10-CM | POA: Diagnosis not present

## 2016-10-11 DIAGNOSIS — J449 Chronic obstructive pulmonary disease, unspecified: Secondary | ICD-10-CM | POA: Diagnosis not present

## 2016-10-11 DIAGNOSIS — I4891 Unspecified atrial fibrillation: Secondary | ICD-10-CM | POA: Diagnosis not present

## 2016-10-11 HISTORY — PX: CARDIOVERSION: SHX1299

## 2016-10-11 SURGERY — CARDIOVERSION
Anesthesia: General

## 2016-10-11 MED ORDER — PROPOFOL 10 MG/ML IV BOLUS
INTRAVENOUS | Status: DC | PRN
  Administered 2016-10-11: 70 mg via INTRAVENOUS

## 2016-10-11 MED ORDER — SODIUM CHLORIDE 0.9 % IV SOLN
INTRAVENOUS | Status: DC | PRN
  Administered 2016-10-11: 10:00:00 via INTRAVENOUS

## 2016-10-11 MED ORDER — SODIUM CHLORIDE 0.9 % IJ SOLN
10.0000 mL | Freq: Once | INTRAMUSCULAR | Status: AC
  Administered 2016-10-11: 10 mL via INTRAVENOUS

## 2016-10-11 MED ORDER — LIDOCAINE HCL (CARDIAC) 20 MG/ML IV SOLN
INTRAVENOUS | Status: DC | PRN
  Administered 2016-10-11: 60 mg via INTRAVENOUS

## 2016-10-11 NOTE — Discharge Instructions (Signed)
Electrical Cardioversion, Care After °This sheet gives you information about how to care for yourself after your procedure. Your health care provider may also give you more specific instructions. If you have problems or questions, contact your health care provider. °What can I expect after the procedure? °After the procedure, it is common to have: °· Some redness on the skin where the shocks were given. ° °Follow these instructions at home: °· Do not drive for 24 hours if you were given a medicine to help you relax (sedative). °· Take over-the-counter and prescription medicines only as told by your health care provider. °· Ask your health care provider how to check your pulse. Check it often. °· Rest for 48 hours after the procedure or as told by your health care provider. °· Avoid or limit your caffeine use as told by your health care provider. °Contact a health care provider if: °· You feel like your heart is beating too quickly or your pulse is not regular. °· You have a serious muscle cramp that does not go away. °Get help right away if: °· You have discomfort in your chest. °· You are dizzy or you feel faint. °· You have trouble breathing or you are short of breath. °· Your speech is slurred. °· You have trouble moving an arm or leg on one side of your body. °· Your fingers or toes turn cold or blue. °This information is not intended to replace advice given to you by your health care provider. Make sure you discuss any questions you have with your health care provider. °Document Released: 02/17/2013 Document Revised: 12/01/2015 Document Reviewed: 11/03/2015 °Elsevier Interactive Patient Education © 2018 [employer] Inc[employer] ° °

## 2016-10-11 NOTE — Transfer of Care (Signed)
Immediate Anesthesia Transfer of Care Note  Patient: [name]  Procedure(s) Performed: Procedure(s): CARDIOVERSION (N/A)  Patient Location: PACU and Endoscopy Unit  Anesthesia Type:General  Level of Consciousness: awake, alert , oriented and patient cooperative  Airway & Oxygen Therapy: Patient Spontanous Breathing and Patient connected to nasal cannula oxygen  Post-op Assessment: Report given to RN and Post -op Vital signs reviewed and stable  Post vital signs: Reviewed and stable  Last Vitals:  Vitals:   10/11/16 0905  BP: 98/67  Pulse: 67  Resp: 18  Temp: 36.4 C    Last Pain:  Vitals:   10/11/16 0905  TempSrc: Oral         Complications: No apparent anesthesia complications

## 2016-10-11 NOTE — H&P (View-Only) (Signed)
PCP is Nche, Charlene Brooke, NP Referring Provider is Juanito Doom, MD  Chief Complaint  Patient presents with  . Routine Post Op    3 month with CT CHEST for f/u L empyema/effusions    HPI:The patient returns for follow-up. In January of this year he underwent bronchoscopy, transbronchial biopsy of mediastinal lymph node showing inflammation, right VATS and drainage and decortication of right empyema. Pathologic material was negative for malignancy.  The patient has successfully stopped smoking. His CT scan shows resolution of the empyema. He has evidence of old granulomatous disease of his lungs and mediastinum. No discrete pulmonary masses. He continues however to lose weight and  have problems with fatigue. He has some postthoracotomy pain. He has atrial fibrillation scheduled for cardioversion by cardiology. Echocardiogram shows normal LV systolic function but RV is dilated with probable associated pulmonary hypertension from his significant COPD. His weight is decreased from 160-148 over the past 3 months  Patient has history of carcinoma prostate which has been not treated but followed by urologist in [place]. He states his last PSA was 3.0 but we have no records.  Past Medical History:  Diagnosis Date  . Arthritis    "neck" (08/15/2015)  . Chronic bronchitis ([place])   . Chronic diastolic CHF (congestive heart failure) ([place])   . Constipation   . COPD (chronic obstructive pulmonary disease) ([place])   . DDD (degenerative disc disease), cervical   . Dilated aortic root ([place])    30m on echo 05/2016  . Edema of left lower extremity   . Facial basal cell cancer   . H/O acne vulgaris 1960s   "[facility]"  . Headache    history of - left temporal- years ago- not current (08/15/2015)  . Persistent atrial fibrillation (HCC)    on coumadin with CHADS2VASC score of 2  . Pneumonia 1960s; 2015 X 2  . [place]   "low spreading; non aggressive type" (08/15/2015)  . [place]monary HTN ([place]    PASP 428mg by echo 05/2016  . Skin cancer    "back"    Past Surgical History:  Procedure Laterality Date  . COLONOSCOPY    . EXCISIONAL HEMORRHOIDECTOMY  1960s  . INGUINAL HERNIA REPAIR Right 2002  . INGUINAL HERNIA REPAIR Bilateral 08/15/2015  . INGUINAL HERNIA REPAIR Bilateral 08/15/2015   Procedure: OPEN REPAIR RECURRENT RIGHT INGUINAL HERNIA WITH MESH AND REPAIR LEFT INGUINAL HERNIA WITH MESH;  Surgeon: HaFanny SkatesMD;  [facility];  Laterality: Bilateral;  . INSERTION OF MESH Bilateral 08/15/2015   Procedure: INSERTION OF MESH;  Surgeon: HaFanny SkatesMD;  [facility] General;  Laterality: Bilateral;  . PROSTATE BIOPSY    . TONSILLECTOMY  1950s  . VIDEO ASSISTED THORACOSCOPY (VATS)/EMPYEMA Right 05/20/2016   Procedure: VIDEO ASSISTED THORACOSCOPY (VATS) with drainage of pleural effusion;  Surgeon: PeIvin PootMD;  [facility];  Laterality: Right;  . Marland KitchenIDEO BRONCHOSCOPY WITH ENDOBRONCHIAL ULTRASOUND Right 05/20/2016   Procedure: VIDEO BRONCHOSCOPY WITH ENDOBRONCHIAL ULTRASOUND;  Surgeon: PeIvin PootMD;  [facility];  Service: Thoracic;  Laterality: Right;    Family History  Problem Relation Age of Onset  . Cancer Mother        colon cancer  . Alcoholism Father   . CAD Father   . Alcohol abuse Father   . Ovarian cancer Sister   . Diabetes  Neg Hx     Social History Social History  Substance Use Topics  . Smoking status: Former Smoker    Packs/day: 1.00    Years: 62.00    Types: Cigarettes    Quit date: 05/14/2016  . Smokeless tobacco: Never Used  . Alcohol use No    Current Outpatient Prescriptions  Medication Sig Dispense Refill  . Ascorbic Acid (VITAMIN C) 1000 MG tablet Take 1,000 mg by mouth 2 (two) times daily.    . furosemide (LASIX) 40 MG tablet Take 1 tablet (40 mg total) by mouth daily. 30 tablet 3  . Probiotic  Product (SOLUBLE FIBER/PROBIOTICS PO) Take 2 capsules by mouth daily.    . rivaroxaban (XARELTO) 20 MG TABS tablet Take 1 tablet (20 mg total) by mouth daily with supper. 30 tablet 1  . spironolactone (ALDACTONE) 25 MG tablet Take 1 tablet (25 mg total) by mouth daily. 30 tablet 3  . traMADol (ULTRAM) 50 MG tablet Take 1 tablet (50 mg total) by mouth every 6 (six) hours as needed. 20 tablet 0   No current facility-administered medications for this visit.     Allergies  Allergen Reactions  . Demerol [Meperidine] Other (See Comments)    UNSPECIFIED REACTION  Causes system to shutdown. ?   . Oxycodone Hcl Other (See Comments)    Pt states this medication 'wires him up' and makes pt hyper; pt does not want to take this ever again    Review of Systems  No headache or change in vision No difficulty swallowing No angina or palpitations Postthoracotomy pain from right VATS January 2018 No abdominal pain or change in bowel habits Lower extremity edema significantly improved on diuretic therapy No blood per rectum or hematuria BP (!) 94/57 (BP Location: Right Arm, Patient Position: Sitting, Cuff Size: Normal)   Pulse 69   Resp 16   Ht _0  (1.803 m)   Wt 147 lb 6.4 oz (66.9 kg)   SpO2 97% Comment: ON RA  BMI 20.56 kg/m  Physical Exam      Exam    General- alert and comfortable but appears fragile and chronically ill   Lungs- clear without rales, wheezes   Neck with bilateral posterior cervical firm lymph nodes, no supralevator nodes noted. No axillary nodes palpable   Cor- irregular rate and rhythm, no murmur , gallop   Abdomen- soft, non-tender   Extremities - warm, non-tender, minimal edema   Neuro- oriented, appropriate, no focal weakness   Diagnostic Tests: CT scan shows resolution of right empyema with small pleural effusion on right versus pleural thickening No suspicious pulmonary parenchymal masses but with significant changes of COPD. Mediastinal adenopathy, mainly  in the subcarinal region which was recently biopsied showing inflammation at the time of surgery for his VATS  Impression: Continued unexplained weight loss Enlarged mediastinal nodes and firm lateral cervical nodes I recommended cervical lymph node biopsy to the patient. This will be scheduled after his previously scheduled DC cardioversion. He understands he will stop his Xarelto 2 days before the biopsy procedure  Plan: Return after biopsy to review pathology. If results are unrevealing then we'll proceed with PET scan although PET scan results will be altered by his recent right thoracotomy   Len Childs, MD [facility] [phone]

## 2016-10-11 NOTE — Anesthesia Postprocedure Evaluation (Signed)
Anesthesia Post Note  Patient: [name]  Procedure(s) Performed: Procedure(s) (LRB): CARDIOVERSION (N/A)     Patient location during evaluation: PACU Anesthesia Type: General Level of consciousness: awake and alert Pain management: pain level controlled Vital Signs Assessment: post-procedure vital signs reviewed and stable Respiratory status: spontaneous breathing, nonlabored ventilation, respiratory function stable and patient connected to nasal cannula oxygen Cardiovascular status: blood pressure returned to baseline and stable Postop Assessment: no signs of nausea or vomiting Anesthetic complications: no    Last Vitals:  Vitals:   10/11/16 1020 10/11/16 1023  BP: (!) 86/52 (!) 90/58  Pulse: 75 73  Resp: 17 10  Temp:      Last Pain:  Vitals:   10/11/16 0947  TempSrc: Oral                 Sheelah Ritacco P Davyd Podgorski

## 2016-10-11 NOTE — Addendum Note (Signed)
Addendum  created 10/11/16 0844 by Rica Koyanagi, MD   Sign clinical note

## 2016-10-11 NOTE — Anesthesia Preprocedure Evaluation (Addendum)
Anesthesia Evaluation  Patient identified by MRN, date of birth, ID band Patient awake    History of Anesthesia Complications Negative for: history of anesthetic complications  Airway Mallampati: III  TM Distance: <3 FB Neck ROM: Limited    Dental  (+) Poor Dentition,    Pulmonary COPD (mild, controlled), Current Smoker, former smoker,    + rhonchi  + decreased breath sounds      Cardiovascular +CHF  + dysrhythmias Atrial Fibrillation  Rhythm:Irregular Rate:Normal  ECG: A-fib, rate 68. Incomplete RBBB ECHO: - The patient was in atrial fibrillation. Normal LV size with mild LV hypertrophy. EF 65-70%. D-shaped interventricular septum suggestive of RV pressure/volume overload. Moderately dilated RV with normal systolic function. Dilated IVC suggestive of elevated RV filling pressure. Mild pulmonary hypertension. pulm HTN   Neuro/Psych    GI/Hepatic negative GI ROS, Neg liver ROS,   Endo/Other    Renal/GU      Musculoskeletal  (+) Arthritis ,   Abdominal   Peds  Hematology   Anesthesia Other Findings   Reproductive/Obstetrics                            Anesthesia Physical  Anesthesia Plan  ASA: III  Anesthesia Plan: General   Post-op Pain Management:    Induction: Intravenous  Airway Management Planned: Mask  Additional Equipment:   Intra-op Plan:   Post-operative Plan:   Informed Consent: I have reviewed the patients History and Physical, chart, labs and discussed the procedure including the risks, benefits and alternatives for the proposed anesthesia with the patient or authorized representative who has indicated his/her understanding and acceptance.     Plan Discussed with: CRNA  Anesthesia Plan Comments:         Anesthesia Quick Evaluation

## 2016-10-11 NOTE — Op Note (Signed)
Procedure: Electrical Cardioversion Indications:  Atrial Fibrillation  Procedure Details:  Consent: Risks of procedure as well as the alternatives and risks of each were explained to the (patient/caregiver).  Consent for procedure obtained.  Time Out: Verified patient identification, verified procedure, site/side was marked, verified correct patient position, special equipment/implants available, medications/allergies/relevent history reviewed, required imaging and test results available.  Performed  Patient placed on cardiac monitor, pulse oximetry, supplemental oxygen as necessary.  Sedation given: Propofol IV, Dr. Joanna Hews Pacer pads placed anterior and posterior chest.  Cardioverted 1 time(s).  Cardioversion with synchronized biphasic 120J shock.  Evaluation: Findings: Post procedure EKG shows: NSR Complications: None Patient did tolerate procedure well.  Time Spent Directly with the Patient:  30 minutes   [name] 10/11/2016, 9:50 AM

## 2016-10-11 NOTE — Interval H&P Note (Signed)
Christopher Pandy, PA-C  Cardiology   Persistent atrial fibrillation ([place]) +1 more  Dx   Referred by [name] Christopher Brooke, NP  Reason for Visit   Additional Documentation   Vitals:   BP 108/76   Pulse 68   Ht 5' 11" (1.803 m)   Wt 162 lb (73.5 kg)   BMI 22.59 kg/m   BSA 1.92 m      More Vitals   Flowsheets:   Custom Formula Data,   Anthropometrics,   MEWS Score     Encounter Info:   Billing Info,   History,   Allergies,   Detailed Report     All Notes   Procedures by Christopher Pandy, PA-C at 09/05/2016 12:29 PM   Author: Consuelo Pandy, PA-C Author Type: Physician Assistant Filed: 09/05/2016 12:29 PM  Note Status: Signed Cosign: Cosign Not Required Encounter Date: 09/03/2016  Editor: [name]        Scan on 09/05/2016 12:29 PM by [name] : [place] on 09/05/2016 12:29 PM by [name] : Ekg - [facility]  Progress Notes by Christopher Pandy, PA-C at 09/03/2016 10:00 AM   Author: Consuelo Pandy, PA-C Author Type: Physician Assistant Filed: 09/03/2016 12:05 PM  Note Status: Signed Cosign: Cosign Not Required Encounter Date: 09/03/2016  Editor: Christopher Pandy, PA-C (Physician Assistant)  Expand All Collapse All     09/03/2016 [name]   [birth year]  [id number]  Primary Physician Christopher Lacy, NP Primary Cardiologist:  [name]   Reason for Visit/CC: Persistent Atrial Fibrillation   HPI:  [name] is a 71 y.o. male who is being seen today for presistent atrial fibrillation. He also has a h/o COPD, tobacco abuse,centrilobular emphysema s/p VATS with bx of subcarinal mass, moderate pulmonary HTN, dilated aortic root and chronic diastolic CHF. He was iinitially placed on coumadin however was transitioned to Xarelto 08/20/16. He had a NST 08/08/16 that was low risk for ischemia. EF normal at 58%.  He presents back to clinic for f/u for his afib. [name] outlined in  her last office note to set up for DCCV after being on DOAC x 4 weeks. He is here today with his wife. EKG shows persistent atrial fibrillation with a CVR of 68 bpm. He remains symptomatic with fatigue. BP is stable. No syncope/ near syncope. He reports full compliance with Xarelto since starting it 08/20/16. Tolerating well. No adverse bleeding.   He does note increased bilateral LEE. He denies exertional dyspnea, orthopnea or PND. He only takes Lasix 3 times/ week (MWF). Also on spironolactone daily. He eats a low sodium diet.   ActiveMedications      Current Meds  Medication Sig  . Ascorbic Acid (VITAMIN C) 1000 MG tablet Take 1,000 mg by mouth 2 (two) times daily.  . diphenhydrAMINE (BENADRYL ALLERGY) 25 MG tablet Take 1 tablet (25 mg total) by mouth at bedtime as needed for sleep.  . furosemide (LASIX) 40 MG tablet Take 1 tablet (40 mg total) by mouth every Monday, Wednesday, and Friday.  . Probiotic Product (SOLUBLE FIBER/PROBIOTICS PO) Take 2 capsules by mouth daily.  . rivaroxaban (XARELTO) 20 MG TABS tablet Take 1 tablet (20 mg total) by mouth daily with supper.  . senna-docusate (SENOKOT-S) 8.6-50 MG tablet Take 1 tablet by mouth at bedtime as needed for mild constipation.  [name] spironolactone (ALDACTONE) 25 MG tablet Take 1 tablet (25 mg total) by mouth daily.  [name]  traMADol (ULTRAM) 50 MG tablet Take 1 tablet (50 mg total) by mouth every 6 (six) hours as needed.  . warfarin (COUMADIN) 4 MG tablet Take 1 tablet (4 mg total) by mouth daily at 6 PM. Or as directed by [facility]madin Clinic          Allergies  Allergen Reactions  . Demerol [Meperidine] Other (See Comments)    UNSPECIFIED REACTION  Causes system to shutdown. ?   . Oxycodone Hcl Other (See Comments)    Pt states this medication 'wires him up' and makes pt hyper; pt does not want to take this ever again       Past Medical History:  Diagnosis Date  . Arthritis    "neck" (08/15/2015)  . Chronic bronchitis ([place])   .  Chronic diastolic CHF (congestive heart failure) ([place])   . Constipation   . COPD (chronic obstructive pulmonary disease) ([place])   . DDD (degenerative disc disease), cervical   . Dilated aortic root ([place])    52m on echo 05/2016  . Edema of left lower extremity   . Facial basal cell cancer   . H/O acne vulgaris 1960s   [facility]  . Headache    history of - left temporal- years ago- not current (08/15/2015)  . Persistent atrial fibrillation ([facility])    on coumadin with CHADS2VASC score of 2  . Pneumonia 1960s; 2015 X 2  . [place]   "low spreading; non aggressive type" (08/15/2015)  . [place]monary HTN ([place]    PASP 456mg by echo 05/2016  . Skin cancer    "back"         Family History  Problem Relation Age of Onset  . Cancer Mother     colon cancer  . Alcoholism Father   . CAD Father   . Alcohol abuse Father   . Ovarian cancer Sister   . Diabetes Neg Hx         Past Surgical History:  Procedure Laterality Date  . COLONOSCOPY    . EXCISIONAL HEMORRHOIDECTOMY  1960s  . INGUINAL HERNIA REPAIR Right 2002  . INGUINAL HERNIA REPAIR Bilateral 08/15/2015  . INGUINAL HERNIA REPAIR Bilateral 08/15/2015   Procedure: OPEN REPAIR RECURRENT RIGHT INGUINAL HERNIA WITH MESH AND REPAIR LEFT INGUINAL HERNIA WITH MESH;  Surgeon: HaFanny SkatesMD;  [facility] [facility];  Laterality: Bilateral;  . INSERTION OF MESH Bilateral 08/15/2015   Procedure: INSERTION OF MESH;  Surgeon: HaFanny SkatesMD;  [place] [facility] Service: General;  Laterality: Bilateral;  . PROSTATE BIOPSY    . TONSILLECTOMY  1950s  . VIDEO ASSISTED THORACOSCOPY (VATS)/EMPYEMA Right 05/20/2016   Procedure: VIDEO ASSISTED THORACOSCOPY (VATS) with drainage of pleural effusion;  Surgeon: PeIvin PootMD;  [place] [facility] Service: Thoracic;  Laterality: Right;  . Marland KitchenIDEO BRONCHOSCOPY WITH ENDOBRONCHIAL ULTRASOUND Right  05/20/2016   Procedure: VIDEO BRONCHOSCOPY WITH ENDOBRONCHIAL ULTRASOUND;  Surgeon: PeIvin PootMD;  [facility];  Service: Thoracic;  Laterality: Right;   Social History        Social History  . Marital status: Married    Spouse name: N/A  . Number of children: N/A  . Years of education: N/A      Occupational History  . Not on file.        Social History Main Topics  . Smoking status: Former Smoker    Packs/day: 1.00  Years: 62.00    Types: Cigarettes    Quit date: 05/14/2016  . Smokeless tobacco: Never Used  . Alcohol use No  . Drug use: No  . Sexual activity: Not Currently       Other Topics Concern  . Not on file      Social History Narrative  . No narrative on file     Review of Systems: General: negative for chills, fever, night sweats or weight changes.  Cardiovascular: negative for chest pain, dyspnea on exertion, edema, orthopnea, palpitations, paroxysmal nocturnal dyspnea or shortness of breath Dermatological: negative for rash Respiratory: negative for cough or wheezing Urologic: negative for hematuria Abdominal: negative for nausea, vomiting, diarrhea, bright red blood per rectum, melena, or hematemesis Neurologic: negative for visual changes, syncope, or dizziness All other systems reviewed and are otherwise negative except as noted above.   Physical Exam:  Blood pressure 118/72, height _0  (1.803 m), weight 162 lb (73.5 kg).  General appearance: alert, cooperative, no distress and tall and thin Neck: no carotid bruit and no JVD Lungs: clear to auscultation bilaterally Heart: irregularly irregular rhythm Extremities: 2+ bilateral LEE Pulses: 2+ and symmetric Skin: Skin color, texture, turgor normal. No rashes or lesions Neurologic: Grossly normal  EKG atrial fibrillation 68 bpm -- personally reviewed   ASSESSMENT AND PLAN:   1. Persistent atrial fibrillation with a controlled ventricular response: Rate stable  at 68 bpm. He is symptomatic with exertional fatigue. NST 07/2016 negative for ischemia. Plan is for DCCV in 2 more weeks. Recently started Xarelto 08/20/16. Needs to be on DOAC x 4 weeks prior to cardioversion. Reports full compliance with this. He was instructed to continue daily uninterrupted use of Xarelto. We will arrange cardioversion with [name] on 09/19/2016 at [facility].   2. Acute on chronic diastolic heart failure: 2+ bilateral lower extremity edema on exam however he denies any associated dyspnea, orthopnea or PND. Currently on Lasix and spironolactone however he only takes Lasix 3 times a day on Monday, Wednesday Friday. He needs additional diuresis. We will check a BMP today to ensure that renal function and potassium levels are okay. If stable, we will have him increase his Lasix to 40 mg daily. He was instructed to continue daily weights at home and to monitor for any worsening edema or development of dyspnea. Low-sodium diet also advised.   3. Moderate pulmonary HTN: PASP is 63mHg and likely secondary to group 2 with pulmonary venous HTN as well group 3 from PNA and effusion at time of last echo. Per Dr. TRadford [name] will repeat echo 1/2019to make sure it has not progressed.  4. Dilated aortic root: Per. Dr. TRadford [name] repeat echo in 1/2019to assess for progression. He needs aggressive control of BP. BP is stable at 118/72 today.    PLAN  Increase Lasix for acute on chronic diastolic HF. DCCV for persistent afib in 2 weeks. F/u in clinic 1 week post cardioversion for repeat EKG.    Brittainy SLadoris Gene [facility] 09/03/2016 10:21 AM     Patient Instructions by CRiley Kill CMA at 09/03/2016 10:00 AM   Author: CRiley Kill CMA Author Type: Certified Medical Assistant Filed: 09/03/2016 11:10 AM  Note Status: Signed Cosign: Cosign Not Required Encounter Date: 09/03/2016  Editor: CRiley Kill CMA (Certified Medical Assistant)    Medication  Instructions:  None Ordered   Labwork: Your physician recommends that you return for lab work today for BMET   Testing/Procedures: Your physician  has recommended that you have a Cardioversion (DCCV). 09/19/2016. Please arrive at the [facility] at 10 am. Electrical Cardioversion uses a jolt of electricity to your heart either through paddles or wired patches attached to your chest. This is a controlled, usually prescheduled, procedure. Defibrillation is done under light anesthesia in the hospital, and you usually go home the day of the procedure. This is done to get your heart back into a normal rhythm. You are not awake for the procedure. Please see the instruction sheet given to you today.     Follow-Up: None Ordered   Any Other Special Instructions Will Be Listed Below (If Applicable).  Please hold you lasix the day of the procedure. Continue to take your James Ivanoff daily with no interruptions. Nothing to eat or drink after midnight the night before the procedure.    If you need a refill on your cardiac medications before your next appointment, please call your pharmacy.      Instructions   Patient Instructions           After Visit Summary (Printed 09/03/2016)  Communications      [facility] Provider CC Chart Rep sent to Flossie Buffy, NP  Media   Electronic signature on 09/03/2016 10:03 AM   Communication Routing History   Recipient Method Sent by Date Sent  Flossie Buffy, NP In [place], [place], [place] 09/03/2016    History and Physical Interval Note:  10/11/2016 9:16 AM  Evette Doffing  has presented today for surgery, with the diagnosis of AFIB  The various methods of treatment have been discussed with the patient and family. After consideration of risks, benefits and other options for treatment, the patient has consented to  Procedure(s): CARDIOVERSION (N/A) as a surgical intervention .  The patient's history has been  reviewed, patient examined, no change in status, stable for surgery.  I have reviewed the patient's chart and labs.  Questions were answered to the patient's satisfaction.     Danessa Mensch

## 2016-10-13 ENCOUNTER — Encounter (HOSPITAL_COMMUNITY): Payer: Self-pay | Admitting: Cardiovascular Disease

## 2016-10-17 ENCOUNTER — Encounter (HOSPITAL_COMMUNITY)
Admission: RE | Admit: 2016-10-17 | Discharge: 2016-10-17 | Disposition: A | Discharge status: Home / Self Care | Payer: Medicare Other | Source: Ambulatory Visit | Attending: Cardiothoracic Surgery | Admitting: Cardiothoracic Surgery

## 2016-10-17 ENCOUNTER — Encounter (HOSPITAL_COMMUNITY): Payer: Self-pay

## 2016-10-17 DIAGNOSIS — R590 Localized enlarged lymph nodes: Secondary | ICD-10-CM | POA: Diagnosis not present

## 2016-10-17 HISTORY — DX: Depression, unspecified: F32.A

## 2016-10-17 HISTORY — DX: Major depressive disorder, single episode, unspecified: F32.9

## 2016-10-17 HISTORY — DX: Anxiety disorder, unspecified: F41.9

## 2016-10-17 LAB — CBC
HCT: 45.2 % (ref 39.0–52.0)
Hemoglobin: 14.7 g/dL (ref 13.0–17.0)
MCH: 30.1 pg (ref 26.0–34.0)
MCHC: 32.5 g/dL (ref 30.0–36.0)
MCV: 92.4 fL (ref 78.0–100.0)
Platelets: 417 10*3/uL — ABNORMAL HIGH (ref 150–400)
RBC: 4.89 MIL/uL (ref 4.22–5.81)
RDW: 14.7 % (ref 11.5–15.5)
WBC: 9.3 10*3/uL (ref 4.0–10.5)

## 2016-10-17 LAB — PROTIME-INR
INR: 1.34
Prothrombin Time: 16.7 seconds — ABNORMAL HIGH (ref 11.4–15.2)

## 2016-10-17 LAB — COMPREHENSIVE METABOLIC PANEL
ALT: 19 U/L (ref 17–63)
AST: 29 U/L (ref 15–41)
Albumin: 2.1 g/dL — ABNORMAL LOW (ref 3.5–5.0)
Alkaline Phosphatase: 169 U/L — ABNORMAL HIGH (ref 38–126)
Anion gap: 7 (ref 5–15)
BUN: 20 mg/dL (ref 6–20)
CO2: 27 mmol/L (ref 22–32)
Calcium: 8.8 mg/dL — ABNORMAL LOW (ref 8.9–10.3)
Chloride: 101 mmol/L (ref 101–111)
Creatinine, Ser: 1.01 mg/dL (ref 0.61–1.24)
GFR calc Af Amer: >60 mL/min (ref >60)
GFR calc non Af Amer: >60 mL/min (ref >60)
Glucose, Bld: 116 mg/dL — ABNORMAL HIGH (ref 65–99)
Potassium: 5 mmol/L (ref 3.5–5.1)
Sodium: 135 mmol/L (ref 135–145)
Total Bilirubin: 0.8 mg/dL (ref 0.3–1.2)
Total Protein: 6 g/dL — ABNORMAL LOW (ref 6.5–8.1)

## 2016-10-17 LAB — APTT: aPTT: 34 seconds (ref 24–36)

## 2016-10-17 NOTE — Pre-Procedure Instructions (Signed)
[name]  10/17/2016      [facility] [place], [address] [place] [address] Phone: [phone] Fax: [phone]    Your procedure is scheduled on 10-22-2016 Tuesday .  Report to [facility] Admitting at  5:30 A.M.  Call this number if you have problems the morning of surgery:  [phone]   Remember:  Do not eat food or drink liquids after midnight.   Take these medicines the morning of surgery with A SIP OF WATER none  Hold Xarelto for 2 days prior to procedure     STOP ASPIRIN,ANTIINFLAMATORIES (IBUPROFEN,ALEVE,MOTRIN,ADVIL,GOODY'S POWDERS),HERBAL SUPPLEMENTS,FISH OIL,AND VITAMINS 5-7 DAYS PRIOR TO SURGERY   Do not wear jewelry,  Do not wear lotions, powders, or perfumes, or deoderant.  Do not shave 48 hours prior to surgery.  Men may shave face and neck.   Do not bring valuables to the hospital.   [facility] is not responsible for any belongings or valuables.  Contacts, dentures or bridgework may not be worn into surgery.  Leave your suitcase in the car.  After surgery it may be brought to your room.  For patients admitted to the hospital, discharge time will be determined by your treatment team.  Patients discharged the day of surgery will not be allowed to drive home.    Special Instructions: [facility] - Preparing for Surgery  Before surgery, you can play an important role.  Because skin is not sterile, your skin needs to be as free of germs as possible.  You can reduce the number of germs on you skin by washing with CHG (chlorahexidine gluconate) soap before surgery.  CHG is an antiseptic cleaner which kills germs and bonds with the skin to continue killing germs even after washing.  Please DO NOT use if you have an allergy to CHG or antibacterial soaps.  If your skin becomes reddened/irritated stop using the CHG and inform your nurse when you arrive  at Short Stay.  Do not shave (including legs and underarms) for at least 48 hours prior to the first CHG shower.  You may shave your face.  Please follow these instructions carefully:   1.  Shower with CHG Soap the night before surgery and the   morning of Surgery.  2.  If you choose to wash your hair, wash your hair first as usual with your normal shampoo.  3.  After you shampoo, rinse your hair and body thoroughly to remove the  Shampoo.  4.  Use CHG as you would any other liquid soap.  You can apply chg directly  to the skin and wash gently with scrungie or a clean washcloth.  5.  Apply the CHG Soap to your body ONLY FROM THE NECK DOWN.   Do not use on open wounds or open sores.  Avoid contact with your eyes,  ears, mouth and genitals (private parts).  Wash genitals (private parts) with your normal soap.  6.  Wash thoroughly, paying special attention to the area where your surgery will be performed.  7.  Thoroughly rinse your body with warm water from the neck down.  8.  DO NOT shower/wash with your normal soap after using and rinsing o  the CHG Soap.  9.  Pat yourself dry with a clean towel.            10.  Wear clean pajamas.  11.  Place clean sheets on your bed the night of your first shower and do not sleep with pets.  Day of Surgery  Do not apply any lotions/deodorants the morning of surgery.  Please wear clean clothes to the hospital/surgery center.   Please read over the following fact sheets that you were given. Surgical Site Infection Prevention

## 2016-10-22 ENCOUNTER — Encounter (HOSPITAL_COMMUNITY)
Admission: RE | Disposition: A | Discharge status: Home / Self Care | Payer: Self-pay | Source: Ambulatory Visit | Attending: Cardiothoracic Surgery

## 2016-10-22 ENCOUNTER — Ambulatory Visit (HOSPITAL_COMMUNITY): Payer: Medicare Other

## 2016-10-22 ENCOUNTER — Encounter (HOSPITAL_COMMUNITY): Payer: Self-pay

## 2016-10-22 ENCOUNTER — Other Ambulatory Visit: Payer: Self-pay | Admitting: *Deleted

## 2016-10-22 ENCOUNTER — Ambulatory Visit (HOSPITAL_COMMUNITY)
Admission: RE | Admit: 2016-10-22 | Discharge: 2016-10-22 | Disposition: A | Discharge status: Home / Self Care | Payer: Medicare Other | Source: Ambulatory Visit | Attending: Cardiothoracic Surgery | Admitting: Cardiothoracic Surgery

## 2016-10-22 ENCOUNTER — Ambulatory Visit (HOSPITAL_COMMUNITY): Payer: Medicare Other | Admitting: Certified Registered Nurse Anesthetist

## 2016-10-22 DIAGNOSIS — R634 Abnormal weight loss: Secondary | ICD-10-CM | POA: Insufficient documentation

## 2016-10-22 DIAGNOSIS — R591 Generalized enlarged lymph nodes: Secondary | ICD-10-CM

## 2016-10-22 DIAGNOSIS — R918 Other nonspecific abnormal finding of lung field: Secondary | ICD-10-CM

## 2016-10-22 DIAGNOSIS — Z7901 Long term (current) use of anticoagulants: Secondary | ICD-10-CM | POA: Diagnosis not present

## 2016-10-22 DIAGNOSIS — I5032 Chronic diastolic (congestive) heart failure: Secondary | ICD-10-CM | POA: Insufficient documentation

## 2016-10-22 DIAGNOSIS — Z87891 Personal history of nicotine dependence: Secondary | ICD-10-CM | POA: Diagnosis not present

## 2016-10-22 DIAGNOSIS — Z885 Allergy status to narcotic agent status: Secondary | ICD-10-CM | POA: Insufficient documentation

## 2016-10-22 DIAGNOSIS — E46 Unspecified protein-calorie malnutrition: Secondary | ICD-10-CM | POA: Diagnosis not present

## 2016-10-22 DIAGNOSIS — I272 Pulmonary hypertension, unspecified: Secondary | ICD-10-CM | POA: Insufficient documentation

## 2016-10-22 DIAGNOSIS — Z85828 Personal history of other malignant neoplasm of skin: Secondary | ICD-10-CM | POA: Insufficient documentation

## 2016-10-22 DIAGNOSIS — J449 Chronic obstructive pulmonary disease, unspecified: Secondary | ICD-10-CM | POA: Diagnosis not present

## 2016-10-22 DIAGNOSIS — J90 Pleural effusion, not elsewhere classified: Secondary | ICD-10-CM | POA: Diagnosis not present

## 2016-10-22 DIAGNOSIS — I481 Persistent atrial fibrillation: Secondary | ICD-10-CM | POA: Insufficient documentation

## 2016-10-22 DIAGNOSIS — R599 Enlarged lymph nodes, unspecified: Secondary | ICD-10-CM

## 2016-10-22 DIAGNOSIS — R590 Localized enlarged lymph nodes: Secondary | ICD-10-CM | POA: Diagnosis not present

## 2016-10-22 DIAGNOSIS — M7989 Other specified soft tissue disorders: Secondary | ICD-10-CM | POA: Diagnosis not present

## 2016-10-22 DIAGNOSIS — Z6820 Body mass index (BMI) 20.0-20.9, adult: Secondary | ICD-10-CM | POA: Insufficient documentation

## 2016-10-22 DIAGNOSIS — D7289 Other specified disorders of white blood cells: Secondary | ICD-10-CM | POA: Diagnosis not present

## 2016-10-22 DIAGNOSIS — Z79899 Other long term (current) drug therapy: Secondary | ICD-10-CM | POA: Insufficient documentation

## 2016-10-22 HISTORY — PX: LYMPH NODE BIOPSY: SHX201

## 2016-10-22 SURGERY — LYMPH NODE BIOPSY
Anesthesia: Monitor Anesthesia Care | Site: Neck | Laterality: Bilateral

## 2016-10-22 MED ORDER — MIDAZOLAM HCL 2 MG/2ML IJ SOLN
INTRAMUSCULAR | Status: DC | PRN
  Administered 2016-10-22 (×2): 1 mg via INTRAVENOUS

## 2016-10-22 MED ORDER — ONDANSETRON HCL 4 MG/2ML IJ SOLN
INTRAMUSCULAR | Status: DC | PRN
  Administered 2016-10-22: 4 mg via INTRAVENOUS

## 2016-10-22 MED ORDER — PHENYLEPHRINE HCL 10 MG/ML IJ SOLN
INTRAMUSCULAR | Status: DC | PRN
  Administered 2016-10-22: 30 ug/min via INTRAVENOUS

## 2016-10-22 MED ORDER — FENTANYL CITRATE (PF) 250 MCG/5ML IJ SOLN
INTRAMUSCULAR | Status: AC
  Filled 2016-10-22: qty 5

## 2016-10-22 MED ORDER — FENTANYL CITRATE (PF) 100 MCG/2ML IJ SOLN: 25.0000 ug | INTRAMUSCULAR | Status: DC | PRN

## 2016-10-22 MED ORDER — TRAMADOL HCL 50 MG PO TABS: 50.0000 mg | ORAL_TABLET | Freq: Four times a day (QID) | ORAL | Status: DC | PRN

## 2016-10-22 MED ORDER — KETOROLAC TROMETHAMINE 15 MG/ML IJ SOLN: 15.0000 mg | Freq: Four times a day (QID) | INTRAMUSCULAR | Status: DC

## 2016-10-22 MED ORDER — PROPOFOL 500 MG/50ML IV EMUL
INTRAVENOUS | Status: DC | PRN
  Administered 2016-10-22: 50 ug/kg/min via INTRAVENOUS

## 2016-10-22 MED ORDER — CEFAZOLIN SODIUM 1 G IJ SOLR
INTRAMUSCULAR | Status: DC | PRN
  Administered 2016-10-22: 2 g via INTRAMUSCULAR

## 2016-10-22 MED ORDER — PROPOFOL 10 MG/ML IV BOLUS
INTRAVENOUS | Status: DC | PRN
Start: 2016-10-22 — End: 2016-10-22
  Administered 2016-10-22 (×2): 20 mg via INTRAVENOUS

## 2016-10-22 MED ORDER — LACTATED RINGERS IV SOLN
INTRAVENOUS | Status: DC | PRN
  Administered 2016-10-22: 07:00:00 via INTRAVENOUS

## 2016-10-22 MED ORDER — MIDAZOLAM HCL 2 MG/2ML IJ SOLN
INTRAMUSCULAR | Status: AC
Start: 2016-10-22 — End: ?
  Filled 2016-10-22: qty 2

## 2016-10-22 MED ORDER — 0.9 % SODIUM CHLORIDE (POUR BTL) OPTIME
TOPICAL | Status: DC | PRN
  Administered 2016-10-22: 1000 mL

## 2016-10-22 MED ORDER — SODIUM CHLORIDE 0.9 % IV SOLN: INTRAVENOUS | Status: DC

## 2016-10-22 MED ORDER — ACETAMINOPHEN 500 MG PO TABS: 1000.0000 mg | ORAL_TABLET | Freq: Four times a day (QID) | ORAL | Status: DC

## 2016-10-22 MED ORDER — LIDOCAINE HCL (PF) 1 % IJ SOLN
INTRAMUSCULAR | Status: AC
  Filled 2016-10-22: qty 30

## 2016-10-22 MED ORDER — SODIUM CHLORIDE 0.9 % IV SOLN: 250.0000 mL | INTRAVENOUS | Status: DC | PRN

## 2016-10-22 MED ORDER — ACETAMINOPHEN 325 MG PO TABS: 650.0000 mg | ORAL_TABLET | ORAL | Status: DC | PRN

## 2016-10-22 MED ORDER — PROPOFOL 10 MG/ML IV BOLUS
INTRAVENOUS | Status: AC
  Filled 2016-10-22: qty 20

## 2016-10-22 MED ORDER — SODIUM CHLORIDE 0.9% FLUSH: 3.0000 mL | INTRAVENOUS | Status: DC | PRN

## 2016-10-22 MED ORDER — LIDOCAINE HCL (PF) 1 % IJ SOLN
INTRAMUSCULAR | Status: DC | PRN
  Administered 2016-10-22: 7 mL

## 2016-10-22 MED ORDER — ONDANSETRON HCL 4 MG/2ML IJ SOLN
INTRAMUSCULAR | Status: AC
  Filled 2016-10-22: qty 2

## 2016-10-22 MED ORDER — SODIUM CHLORIDE 0.9% FLUSH: 3.0000 mL | Freq: Two times a day (BID) | INTRAVENOUS | Status: DC

## 2016-10-22 MED ORDER — ACETAMINOPHEN 650 MG RE SUPP: 650.0000 mg | RECTAL | Status: DC | PRN

## 2016-10-22 SURGICAL SUPPLY — 43 items
BLADE SURG 15 STRL LF DISP TIS (BLADE) ×1 IMPLANT
BLADE SURG 15 STRL SS (BLADE) ×2
CANISTER SUCT 3000ML PPV (MISCELLANEOUS) ×3 IMPLANT
CLIP TI MEDIUM 24 (CLIP) ×3 IMPLANT
CLIP TI WIDE RED SMALL 24 (CLIP) ×3 IMPLANT
CONT SPEC 4OZ CLIKSEAL STRL BL (MISCELLANEOUS) ×6 IMPLANT
COVER SURGICAL LIGHT HANDLE (MISCELLANEOUS) IMPLANT
DRAPE HALF SHEET 40X57 (DRAPES) IMPLANT
DRAPE LAPAROTOMY T 102X78X121 (DRAPES) ×3 IMPLANT
DRSG AQUACEL AG ADV 3.5X14 (GAUZE/BANDAGES/DRESSINGS) IMPLANT
ELECT CAUTERY BLADE 6.4 (BLADE) IMPLANT
ELECT REM PT RETURN 9FT ADLT (ELECTROSURGICAL) ×3
ELECTRODE REM PT RTRN 9FT ADLT (ELECTROSURGICAL) ×1 IMPLANT
GAUZE SPONGE 4X4 12PLY STRL (GAUZE/BANDAGES/DRESSINGS) IMPLANT
GAUZE SPONGE 4X4 12PLY STRL LF (GAUZE/BANDAGES/DRESSINGS) ×3 IMPLANT
GLOVE BIO SURGEON STRL SZ7.5 (GLOVE) ×3 IMPLANT
GLOVE BIOGEL M STRL SZ7.5 (GLOVE) IMPLANT
GLOVE BIOGEL PI IND STRL 6 (GLOVE) ×4 IMPLANT
GLOVE BIOGEL PI INDICATOR 6 (GLOVE) ×8
GOWN STRL REUS W/ TWL LRG LVL3 (GOWN DISPOSABLE) ×3 IMPLANT
GOWN STRL REUS W/ TWL XL LVL3 (GOWN DISPOSABLE) IMPLANT
GOWN STRL REUS W/TWL LRG LVL3 (GOWN DISPOSABLE) ×6
GOWN STRL REUS W/TWL XL LVL3 (GOWN DISPOSABLE)
KIT BASIN OR (CUSTOM PROCEDURE TRAY) ×3 IMPLANT
KIT ROOM TURNOVER OR (KITS) ×3 IMPLANT
NEEDLE HYPO 25GX1X1/2 BEV (NEEDLE) ×3 IMPLANT
NS IRRIG 1000ML POUR BTL (IV SOLUTION) ×3 IMPLANT
PACK GENERAL/GYN (CUSTOM PROCEDURE TRAY) ×3 IMPLANT
PAD ARMBOARD 7.5X6 YLW CONV (MISCELLANEOUS) ×6 IMPLANT
SPONGE INTESTINAL PEANUT (DISPOSABLE) ×3 IMPLANT
SUT ETHILON 3 0 PS 1 (SUTURE) ×6 IMPLANT
SUT SILK 2 0 (SUTURE)
SUT SILK 2-0 18XBRD TIE 12 (SUTURE) IMPLANT
SUT SILK 3 0 SH CR/8 (SUTURE) ×3 IMPLANT
SUT VIC AB 3-0 SH 8-18 (SUTURE) ×3 IMPLANT
SUT VIC AB 3-0 X1 27 (SUTURE) IMPLANT
SWAB COLLECTION DEVICE MRSA (MISCELLANEOUS) IMPLANT
SWAB CULTURE ESWAB REG 1ML (MISCELLANEOUS) IMPLANT
SYR CONTROL 10ML LL (SYRINGE) ×3 IMPLANT
TAPE CLOTH SURG 4X10 WHT LF (GAUZE/BANDAGES/DRESSINGS) ×3 IMPLANT
TOWEL OR 17X24 6PK STRL BLUE (TOWEL DISPOSABLE) ×3 IMPLANT
TOWEL OR 17X26 10 PK STRL BLUE (TOWEL DISPOSABLE) ×3 IMPLANT
WATER STERILE IRR 1000ML POUR (IV SOLUTION) ×3 IMPLANT

## 2016-10-22 NOTE — Op Note (Signed)
NAME:  ARSLAN, KIER NO.:  MEDICAL RECORD NO.:  [id number]  LOCATION:                                 FACILITY:  PHYSICIAN:  [name], M.D.  DATE OF BIRTH:  26-Aug-1945  DATE OF PROCEDURE:  10/22/2016 DATE OF DISCHARGE:                              OPERATIVE REPORT   SURGEON:  [name], M.D.  ANESTHESIA:  IV conscious sedation, monitored, and local 1% lidocaine.  PREOPERATIVE DIAGNOSES:  Mediastinal adenopathy, cervical adenopathy, history of VATS for right lung empyema, and decortication in January 2018.  POSTOPERATIVE DIAGNOSES:  Mediastinal adenopathy, cervical adenopathy, history of VATS for right lung empyema, and decortication in January 2018.  DESCRIPTION OF PROCEDURE:  The patient was brought to the operating room from preoperative holding after he was assessed, the proper site marked, and preoperative laboratory values and chest x-ray personally reviewed and counseled the patient.  The patient understood the biopsy was being performed because of persistent weight loss and concern over malignancy after surgery for right lung empyema earlier this year.  He understood the risks of bleeding and infection.  The patient was placed supine on the operating room table and IV conscious sedation monitored by anesthesia was started.  The left neck was prepped and draped as a sterile field.  A proper time-out was performed.  Local 1% lidocaine was infiltrated in the neck.  A small incision was made over the palpable firm lymph node area.  Dissection was carried through the dermis into the subcutaneous space.  The lymph node material was excised en bloc and submitted for permanent sections. The wound was irrigated and then closed with interrupted 3-0 Vicryl layers to the subcutaneous layer and interrupted 3-0 nylon for the skin. A sterile dressing was applied.  The patient resumed spontaneous respirations and was transferred back to  recovery room for further observation.     [name], M.D.     PV/MEDQ  D:  10/22/2016  T:  10/22/2016  Job:  [id number]

## 2016-10-22 NOTE — Discharge Instructions (Signed)
Do not take the spironolactone.  May remove dressing in 24 Hours  May Shower June 15.  Dr. Prescott Gum will call with biopsy results.  Rest today, normal activity tomorrow  Restart Xarelto tomorrow

## 2016-10-22 NOTE — Anesthesia Procedure Notes (Signed)
Procedure Name: MAC Date/Time: 10/22/2016 7:34 AM Performed by: Mervyn Gay Pre-anesthesia Checklist: Patient identified, Patient being monitored, Timeout performed, Emergency Drugs available and Suction available Patient Re-evaluated:Patient Re-evaluated prior to inductionOxygen Delivery Method: Simple face mask and Nasal cannula Number of attempts: 1 Placement Confirmation: positive ETCO2 Dental Injury: Teeth and Oropharynx as per pre-operative assessment

## 2016-10-22 NOTE — Progress Notes (Signed)
The patient was examined and preop studies reviewed. There has been no change from the prior exam and the patient is ready for surgery. Plan left , right neck lymph node biopsy on [address]

## 2016-10-22 NOTE — Brief Op Note (Signed)
10/22/2016  8:46 AM  PATIENT:  [name]  70 y.o. male  PRE-OPERATIVE DIAGNOSIS:  Enlarged mediastinal nodes and firm lateral cervical nodes  POST-OPERATIVE DIAGNOSIS:  Enlarged mediastinal nodes and firm cervical nodes  PROCEDURE:  Excisional left cervical lymph node biopsy  SURGEON:  Surgeon(s) and Role:    [name], MD - Primary  PHYSICIAN ASSISTANT: none  ASSISTANTS: none    ANESTHESIA:   local and IV sedation  EBL:  Total I/O In: 500 [I.V.:500] Out: 5 [Blood:5]  BLOOD ADMINISTERED:none  DRAINS: none   LOCAL MEDICATIONS USED:  LIDOCAINE  and Amount: 10 ml  SPECIMEN:  Excision  DISPOSITION OF SPECIMEN:  PATHOLOGY  COUNTS:  YES  TOURNIQUET:  * No tourniquets in log *  DICTATION: .Dragon Dictation  PLAN OF CARE: Discharge to home after PACU  PATIENT DISPOSITION:  PACU - hemodynamically stable.   Delay start of Pharmacological VTE agent (>24hrs) due to surgical blood loss or risk of bleeding: yes

## 2016-10-22 NOTE — Transfer of Care (Signed)
Immediate Anesthesia Transfer of Care Note  Patient: [name]  Procedure(s) Performed: Procedure(s): RIGHT AND LEFT NECK LYMPH NODE BIOPSY (Bilateral)  Patient Location: PACU  Anesthesia Type:MAC  Level of Consciousness: awake, alert , oriented and patient cooperative  Airway & Oxygen Therapy: Patient Spontanous Breathing and Patient connected to nasal cannula oxygen  Post-op Assessment: Report given to RN, Post -op Vital signs reviewed and stable and Patient moving all extremities X 4  Post vital signs: Reviewed and stable  Last Vitals:  Vitals:   10/22/16 0618  BP: (!) 113/54  Pulse: 82  Resp: 18  Temp: 36.6 C    Last Pain:  Vitals:   10/22/16 0618  TempSrc: Oral      Patients Stated Pain Goal: 1 (44/31/54 0086)  Complications: No apparent anesthesia complications

## 2016-10-22 NOTE — Anesthesia Postprocedure Evaluation (Signed)
Anesthesia Post Note  Patient: [name]  Procedure(s) Performed: Procedure(s) (LRB): LEFT NECK LYMPH NODE BIOPSY (Bilateral)     Patient location during evaluation: PACU Anesthesia Type: MAC Level of consciousness: awake and alert Pain management: pain level controlled Vital Signs Assessment: post-procedure vital signs reviewed and stable Respiratory status: spontaneous breathing, nonlabored ventilation, respiratory function stable and patient connected to nasal cannula oxygen Cardiovascular status: stable and blood pressure returned to baseline Anesthetic complications: no    Last Vitals:  Vitals:   10/22/16 0910 10/22/16 0913  BP: (!) 87/62 92/65  Pulse: 77 83  Resp: 16 16  Temp: 36.7 C     Last Pain:  Vitals:   10/22/16 0913  TempSrc:   PainSc: 2                  Jailin Moomaw

## 2016-10-22 NOTE — Anesthesia Preprocedure Evaluation (Addendum)
Anesthesia Evaluation  Patient identified by MRN, date of birth, ID band Patient awake    History of Anesthesia Complications Negative for: history of anesthetic complications  Airway Mallampati: II  TM Distance: <3 FB Neck ROM: Limited    Dental  (+) Poor Dentition,    Pulmonary COPD (mild, controlled), Current Smoker, former smoker,    breath sounds clear to auscultation- rhonchi (-) decreased breath sounds      Cardiovascular Exercise Tolerance: Poor + Peripheral Vascular Disease and +CHF   Rhythm:Regular Rate:Normal  ECHO: - The patient was in atrial fibrillation. Normal LV size with mild LV hypertrophy. EF 65-70%. D-shaped interventricular septum suggestive of RV pressure/volume overload. Moderately dilated RV with normal systolic function. Dilated IVC suggestive of elevated RV filling pressure. Mild pulmonary hypertension. pulm HTN   Neuro/Psych  Headaches, PSYCHIATRIC DISORDERS Anxiety Depression    GI/Hepatic negative GI ROS, Neg liver ROS,   Endo/Other  negative endocrine ROS  Renal/GU negative Renal ROS     Musculoskeletal  (+) Arthritis ,   Abdominal   Peds  Hematology negative hematology ROS (+)   Anesthesia Other Findings   Reproductive/Obstetrics                           Lab Results  Component Value Date   WBC 9.3 10/17/2016   HGB 14.7 10/17/2016   HCT 45.2 10/17/2016   MCV 92.4 10/17/2016   PLT 417 (H) 10/17/2016     Chemistry      Component Value Date/Time   NA 135 10/17/2016 1225   NA 138 10/04/2016 1134   K 5.0 10/17/2016 1225   CL 101 10/17/2016 1225   CO2 27 10/17/2016 1225   BUN 20 10/17/2016 1225   BUN 31 (H) 10/04/2016 1134   CREATININE 1.01 10/17/2016 1225   CREATININE 0.84 06/12/2014 0922      Component Value Date/Time   CALCIUM 8.8 (L) 10/17/2016 1225   ALKPHOS 169 (H) 10/17/2016 1225   AST 29 10/17/2016 1225   ALT 19 10/17/2016 1225   BILITOT 0.8  10/17/2016 1225     BP Readings from Last 3 Encounters:  10/22/16 (!) 113/54  10/17/16 97/61  10/11/16 (!) 90/58   EKG 10/11/16: Sinus rhythm with Premature atrial complexes Incomplete right bundle branch block ST & T wave abnormality, consider inferior ischemia ST & T wave abnormality, consider anterolateral ischemia Prolonged QT  Anesthesia Physical  Anesthesia Plan  ASA: III  Anesthesia Plan: MAC   Post-op Pain Management:    Induction: Intravenous  PONV Risk Score and Plan: 1 and Ondansetron, Dexamethasone and Treatment may vary due to age or medical condition  Airway Management Planned: Mask, Natural Airway and Nasal Cannula  Additional Equipment: None  Intra-op Plan:   Post-operative Plan:   Informed Consent: I have reviewed the patients History and Physical, chart, labs and discussed the procedure including the risks, benefits and alternatives for the proposed anesthesia with the patient or authorized representative who has indicated his/her understanding and acceptance.   Dental advisory given  Plan Discussed with: CRNA, Anesthesiologist and Surgeon  Anesthesia Plan Comments:        Anesthesia Quick Evaluation

## 2016-10-23 ENCOUNTER — Encounter (HOSPITAL_COMMUNITY): Payer: Self-pay | Admitting: Cardiothoracic Surgery

## 2016-10-23 ENCOUNTER — Other Ambulatory Visit: Payer: Self-pay | Admitting: Cardiology

## 2016-10-23 MED ORDER — RIVAROXABAN 20 MG PO TABS: 20.0000 mg | ORAL_TABLET | Freq: Every day | ORAL | 5 refills | Status: DC

## 2016-10-23 NOTE — Telephone Encounter (Signed)
*  STAT* If patient is at the pharmacy, call can be transferred to refill team.   1. Which medications need to be refilled? (please list name of each medication and dose if known) Xarelto-please call this in today if possible 2. Which pharmacy/location (including street and city if local pharmacy) is medication to be sent to?[facility]-[phone]  3. Do they need a 30 day or 90 day supply? 30 and refills

## 2016-10-23 NOTE — Telephone Encounter (Signed)
Age 71 Wt 66.9kg (10/02/2016) Saw Brittainy Simmions on 09/07/2016 10/17/2016 SrCr 1.01 10/17/2016 Hgb 14.7 HCT 45.2 CrCl 61.63 Refill done for Xarelto 20 mg daily as requested

## 2016-10-24 ENCOUNTER — Emergency Department (HOSPITAL_COMMUNITY)
Admission: EM | Admit: 2016-10-24 | Discharge: 2016-10-24 | Disposition: A | Discharge status: Home / Self Care | Payer: Medicare Other | Attending: Emergency Medicine | Admitting: Emergency Medicine

## 2016-10-24 ENCOUNTER — Telehealth: Payer: Self-pay | Admitting: *Deleted

## 2016-10-24 ENCOUNTER — Encounter (HOSPITAL_COMMUNITY): Payer: Self-pay | Admitting: *Deleted

## 2016-10-24 ENCOUNTER — Telehealth: Payer: Self-pay | Admitting: Cardiology

## 2016-10-24 DIAGNOSIS — Z8546 Personal history of malignant neoplasm of prostate: Secondary | ICD-10-CM | POA: Diagnosis not present

## 2016-10-24 DIAGNOSIS — Z85828 Personal history of other malignant neoplasm of skin: Secondary | ICD-10-CM | POA: Insufficient documentation

## 2016-10-24 DIAGNOSIS — Z885 Allergy status to narcotic agent status: Secondary | ICD-10-CM | POA: Insufficient documentation

## 2016-10-24 DIAGNOSIS — I5032 Chronic diastolic (congestive) heart failure: Secondary | ICD-10-CM | POA: Diagnosis not present

## 2016-10-24 DIAGNOSIS — Z7901 Long term (current) use of anticoagulants: Secondary | ICD-10-CM | POA: Diagnosis not present

## 2016-10-24 DIAGNOSIS — Z5189 Encounter for other specified aftercare: Secondary | ICD-10-CM | POA: Diagnosis not present

## 2016-10-24 DIAGNOSIS — Z4801 Encounter for change or removal of surgical wound dressing: Secondary | ICD-10-CM | POA: Diagnosis not present

## 2016-10-24 DIAGNOSIS — Z87891 Personal history of nicotine dependence: Secondary | ICD-10-CM | POA: Diagnosis not present

## 2016-10-24 DIAGNOSIS — Z79899 Other long term (current) drug therapy: Secondary | ICD-10-CM | POA: Insufficient documentation

## 2016-10-24 DIAGNOSIS — J449 Chronic obstructive pulmonary disease, unspecified: Secondary | ICD-10-CM | POA: Insufficient documentation

## 2016-10-24 NOTE — Telephone Encounter (Signed)
New message    Pt wife is calling stating that pt went to the hospital this morning because a place he had a biopsy kept bleeding. The ER doctor advised them to call and see if pt can stay off his xarelto for a few days.

## 2016-10-24 NOTE — ED Notes (Signed)
Dressing applied by Sherrine Maples, extern, and Estill Batten RN.

## 2016-10-24 NOTE — Discharge Instructions (Signed)
Apply a nonstick dressing to your wound daily. You may find nonstick, Vaseline gauze type dressings at the pharmacy. Place a thin layer of gauze over this and then paper tape.  If you have any bleeding from the wound, apply constant steady pressure for 15 minutes. If bleeding is heavy or persisting, return to the emergency department for recheck.

## 2016-10-24 NOTE — Telephone Encounter (Signed)
[name] had bilateral neck lymph node bxs on 10/21/16 by Dr. Prescott Gum. Due to some bleeding he had to go to the ED early this morning. He was advised to call his cardilologist to see if he could stop his Xarelto. He did and they said no due to the fact that he had had a cardioversion on 10/11/16...needs to be on it at least 30 days. I consulted with Dr. Prescott Gum. He said for him to not take the Xarelto tonight, but he could resume tomorrow. I relayed this info to [name] and he voiced understanding. Of note, the bleeding has completely stopped.

## 2016-10-24 NOTE — ED Provider Notes (Signed)
[facility] DEPT Provider Note   CSN: [id number] Arrival date & time: 10/24/16  0605     History   Chief Complaint Chief Complaint  Patient presents with  . Post-op Problem    HPI [name] is a 71 y.o. male.  HPI Patient had a lymph node biopsy 2 days ago. Patient just restarted his Xarelto last night. This morning he found a lot of blood on his pillow. He reports that the wound was still bleeding somewhat just until he got to the emergency department. Is now stopped.  Patient has history of atrial fibrillation and takes Xarelto. Past Medical History:  Diagnosis Date  . Anxiety   . Arthritis    "neck" (08/15/2015)  . Chronic bronchitis ([place])   . Chronic diastolic CHF (congestive heart failure) ([place])   . Constipation   . COPD (chronic obstructive pulmonary disease) ([place])   . DDD (degenerative disc disease), cervical   . Depression   . Dilated aortic root ([place])    73m on echo 05/2016  . Dysrhythmia    A-Fib  cardioverted on 10-11-2016  . Edema of left lower extremity   . Facial basal cell cancer   . H/O acne vulgaris 1960s   [facility]  . Headache    history of - left temporal- years ago- not current (08/15/2015)  . Persistent atrial fibrillation (HCC)    on coumadin with CHADS2VASC score of 2  . Pneumonia 1960s; 2015 X 2  . Prostate cancer (HSonoita dx'd early 2000s   "low spreading; non aggressive type" (08/15/2015)  . Pulmonary HTN (HBrew[place]    PASP 465mg by echo 05/2016  . Skin cancer    "back"    Patient Active Problem List   Diagnosis Date Noted  . Persistent atrial fibrillation (HCGarrochales  . Chronic diastolic CHF (congestive heart failure) (HCAckley  . Pulmonary HTN (HCDumas  . Dilated aortic root (HCHaverhill  . Encounter for therapeutic drug monitoring 06/14/2016  . Septic shock (HCMerrick  . Bilateral lower extremity edema   . Acute respiratory failure with hypoxia (HCJacksonville Beach01/01/2017  . Lung mass 05/21/2016  . Loculated  pleural effusion 05/21/2016  . Parapneumonic effusion 05/21/2016  . Empyema (HCLake Lorraine01/01/2017  . S/P thoracentesis   . Bilateral pleural effusion 05/14/2016  . COPD (chronic obstructive pulmonary disease) (HCIntercourse01/06/2016  . Tobacco abuse 05/14/2016  . Protein-calorie malnutrition, severe (HCManley Hot Springs01/06/2016    Past Surgical History:  Procedure Laterality Date  . CARDIOVERSION N/A 10/11/2016   Procedure: CARDIOVERSION;  Surgeon: CrSanda KleinMD;  [facility];  Service: Cardiovascular;  Laterality: N/A;  . COLONOSCOPY    . EXCISIONAL HEMORRHOIDECTOMY  1960s  . INGUINAL HERNIA REPAIR Right 2002  . INGUINAL HERNIA REPAIR Bilateral 08/15/2015  . INGUINAL HERNIA REPAIR Bilateral 08/15/2015   Procedure: OPEN REPAIR RECURRENT RIGHT INGUINAL HERNIA WITH MESH AND REPAIR LEFT INGUINAL HERNIA WITH MESH;  Surgeon: HaFanny SkatesMD;  [facility] Service: General;  Laterality: Bilateral;  . INSERTION OF MESH Bilateral 08/15/2015   Procedure: INSERTION OF MESH;  Surgeon: HaFanny SkatesMD;  [facility] Service: General;  Laterality: Bilateral;  . LYMPH NODE BIOPSY Bilateral 10/22/2016   Procedure: LEFT NECK LYMPH NODE BIOPSY;  Surgeon: VaIvin PootMD;  [facility] Service: Thoracic;  Laterality: Bilateral;  . PROSTATE BIOPSY    . TONSILLECTOMY  1950s  . VIDEO ASSISTED THORACOSCOPY (VATS)/EMPYEMA Right  05/20/2016   Procedure: VIDEO ASSISTED THORACOSCOPY (VATS) with drainage of pleural effusion;  Surgeon: [name], MD;  Location: [facility];  Service: Thoracic;  Laterality: Right;  [name] VIDEO BRONCHOSCOPY WITH ENDOBRONCHIAL ULTRASOUND Right 05/20/2016   Procedure: VIDEO BRONCHOSCOPY WITH ENDOBRONCHIAL ULTRASOUND;  Surgeon: [name], MD;  Location: [facility];  Service: Thoracic;  Laterality: Right;       Home Medications    Prior to Admission medications   Medication Sig Start Date End Date Taking? Authorizing Provider  Ascorbic Acid (VITAMIN C) 1000 MG tablet Take  1,000 mg by mouth 2 (two) times daily.    [provider]  furosemide (LASIX) 40 MG tablet Take 1 tablet (40 mg total) by mouth daily. 09/04/16   Lyda Jester M, PA-C  Probiotic Product (SOLUBLE FIBER/PROBIOTICS PO) Take 2 capsules by mouth 3 (three) times a week.     [provider]  rivaroxaban (XARELTO) 20 MG TABS tablet Take 1 tablet (20 mg total) by mouth daily with supper. 10/23/16   [name], MD  spironolactone (ALDACTONE) 25 MG tablet Take 1 tablet (25 mg total) by mouth daily. 07/10/16   Barrett, Erin R, PA-C  traMADol (ULTRAM) 50 MG tablet Take 1 tablet (50 mg total) by mouth every 6 (six) hours as needed. Patient taking differently: Take 50 mg by mouth at bedtime as needed for moderate pain.  09/26/16   Nche, Charlene Brooke, NP    Family History Family History  Problem Relation Age of Onset  . Cancer Mother        colon cancer  . Alcoholism Father   . CAD Father   . Alcohol abuse Father   . Ovarian cancer Sister   . Diabetes Neg Hx     Social History Social History  Substance Use Topics  . Smoking status: Former Smoker    Packs/day: 1.00    Years: 62.00    Types: Cigarettes    Quit date: 05/14/2016  . Smokeless tobacco: Never Used  . Alcohol use No     Allergies   Demerol [meperidine] and Oxycodone hcl   Review of Systems Review of Systems Constitutional: No fever no chills no malaise Cardiovascular: No palpitation no chest pain no shortness of breath  Physical Exam Updated Vital Signs BP 94/62   Pulse 79   Temp 97.8 F (36.6 C) (Oral)   Resp 16   SpO2 99%   Physical Exam  Constitutional: He is oriented to person, place, and time. He appears well-developed and well-nourished. No distress.  HENT:  Head: Normocephalic and atraumatic.  Eyes: EOM are normal.  Neck: Neck supple.  5 cm sutured incision left lateral base of neck. Excellent condition of wound. Margins well approximated. No erythema, no hematoma, no bleeding.    Cardiovascular: Normal rate, regular rhythm, normal heart sounds and intact distal pulses.   Pulmonary/Chest: Effort normal and breath sounds normal.  Musculoskeletal: Normal range of motion.  Neurological: He is alert and oriented to person, place, and time. No cranial nerve deficit. He exhibits normal muscle tone. Coordination normal.  Skin: Skin is warm and dry.  Psychiatric: He has a normal mood and affect.     ED Treatments / Results  Labs (all labs ordered are listed, but only abnormal results are displayed) Labs Reviewed - No data to display  EKG  EKG Interpretation None       Radiology No results found.  Procedures Procedures (including critical care time)  Medications Ordered in ED  Medications - No data to display   Initial Impression / Assessment and Plan / ED Course  I have reviewed the triage vital signs and the nursing notes.  Pertinent labs & imaging results that were available during my care of the patient were reviewed by me and considered in my medical decision making (see chart for details).      Final Clinical Impressions(s) / ED Diagnoses   Final diagnoses:  Visit for wound check  Condition of wound is excellent. There is no hematoma formation. Patient just restarted Xarelto last night and had removed a fresh bandage. He had bleeding at home but there is no further bleeding. There is no evidence of subcutaneous hematoma formation. Patient is counseled on constant direct pressure if there is any rebleeding. He is counseled on application of a nonadhesive dressing.  New Prescriptions New Prescriptions   No medications on file     Charlesetta Shanks, MD 10/24/16 [phone]

## 2016-10-24 NOTE — Telephone Encounter (Signed)
Discussed with RPH and informed patient he should continue Xarelto daily for at least 30 days post DCCV (his cardioversion was 6/1). He reports he had a biopsy 6/12 and Dr. Darcey Nora instructed him to hold Xarelto for procedure. He restarted the medication yesterday and woke up with bleeding so badly from the site today that he and his wife went to the ED. There, the MD instructed him to call Cardiology to ask if he can hold Xarelto a few days until his biopsy site heals.  Reiterated to patient that it is not advised for anyone to hold/stop Xarelto within 30 days of DCCV. He got confused and asked why the doctors "don't talk." Informed the patient that doctors have to weigh risks and benefits daily and Dr. Darcey Nora thought biopsy was necessary enough not to wait for 30 days outside of DCCV.  He will call Dr. Thayer Ohm office and discuss holding Xarelto since he held the medication to begin with.

## 2016-10-24 NOTE — ED Triage Notes (Signed)
Pt had a biopsy on the L side of his neck on 6/12. Pt woke up this morning with large amounts of blood on his pillow. Per family, a new bandage was placed on area yesterday. Pt is on xarleto

## 2016-10-29 ENCOUNTER — Encounter: Payer: Medicare Other | Admitting: Cardiothoracic Surgery

## 2016-10-31 ENCOUNTER — Ambulatory Visit (HOSPITAL_COMMUNITY)
Admission: RE | Admit: 2016-10-31 | Discharge: 2016-10-31 | Disposition: A | Discharge status: Home / Self Care | Payer: Medicare Other | Source: Ambulatory Visit | Attending: Cardiothoracic Surgery | Admitting: Cardiothoracic Surgery

## 2016-10-31 DIAGNOSIS — R599 Enlarged lymph nodes, unspecified: Secondary | ICD-10-CM

## 2016-10-31 DIAGNOSIS — R918 Other nonspecific abnormal finding of lung field: Secondary | ICD-10-CM | POA: Insufficient documentation

## 2016-10-31 DIAGNOSIS — R591 Generalized enlarged lymph nodes: Secondary | ICD-10-CM

## 2016-10-31 DIAGNOSIS — J90 Pleural effusion, not elsewhere classified: Secondary | ICD-10-CM | POA: Diagnosis not present

## 2016-10-31 LAB — GLUCOSE, CAPILLARY: Glucose-Capillary: 84 mg/dL (ref 65–99)

## 2016-10-31 MED ORDER — FLUDEOXYGLUCOSE F - 18 (FDG) INJECTION
7.3000 | Freq: Once | INTRAVENOUS | Status: AC | PRN
  Administered 2016-10-31: 7.3 via INTRAVENOUS

## 2016-11-01 ENCOUNTER — Encounter: Payer: Medicare Other | Admitting: Cardiothoracic Surgery

## 2016-11-01 ENCOUNTER — Encounter: Payer: Self-pay | Admitting: Cardiothoracic Surgery

## 2016-11-01 ENCOUNTER — Ambulatory Visit (INDEPENDENT_AMBULATORY_CARE_PROVIDER_SITE_OTHER): Payer: Self-pay | Admitting: Cardiothoracic Surgery

## 2016-11-01 ENCOUNTER — Other Ambulatory Visit: Payer: Self-pay | Admitting: *Deleted

## 2016-11-01 VITALS — BP 90/57 | HR 83 | Resp 20 | Ht 71.0 in | Wt 157.4 lb

## 2016-11-01 DIAGNOSIS — Z09 Encounter for follow-up examination after completed treatment for conditions other than malignant neoplasm: Secondary | ICD-10-CM

## 2016-11-01 DIAGNOSIS — Z8546 Personal history of malignant neoplasm of prostate: Secondary | ICD-10-CM

## 2016-11-01 DIAGNOSIS — G8918 Other acute postprocedural pain: Secondary | ICD-10-CM

## 2016-11-01 DIAGNOSIS — J869 Pyothorax without fistula: Secondary | ICD-10-CM

## 2016-11-01 MED ORDER — TRAMADOL HCL 50 MG PO TABS: 50.0000 mg | ORAL_TABLET | Freq: Every evening | ORAL | 0 refills | Status: DC | PRN

## 2016-11-01 NOTE — Progress Notes (Signed)
PCP is Nche, Charlene Brooke, NP Referring Provider is Juanito Doom, MD  Chief Complaint  Patient presents with  . Routine Post Op    f/u to discuss biopsy results    HPI: Patient returns for postop visit after biopsy of left cervical lymph node. The patient has history of right lower lobe pneumonia and empyema which required drainage and decortication of right lower lobe early 2018. He has had residual mediastinal adenopathy in the low subcarinal region. There is concern that he could've had a occult lung cancer you know his operative specimens were all negative. Fortunately the left cervical node was negative as well.  To assess the malignant potential of the 3 cm subcarinal lymph node station he had a PET scan. This showed only mild activity of the region as well as scattered other low activity nodes not consistent with lung cancer. The radiologist raised the question of possible slow-growing prostate cancer. The patient does have history of prostate cancer followed by Dr. Lacinda Axon, urologist in [place]. The patient has not been treated but he is recommended to return to his urologist for further evaluation. I provided the patient the report from the PET scan regarding the concern over prostate cancer. The patient will get a PSA drawn in the lab today.  Otherwise the patient's overall strength is improved. His appetite is improved. He is now gaining weight. He remains in controlled atrial fibrillation on Xarelto.  The patient's lower extremity edema has significantly improved and resolved and he does not need further Lasix therapy with improved diet and nutritional status.  Past Medical History:  Diagnosis Date  . Anxiety   . Arthritis    "neck" (08/15/2015)  . Chronic bronchitis ([place])   . Chronic diastolic CHF (congestive heart failure) ([place])   . Constipation   . COPD (chronic obstructive pulmonary disease) ([place])   . DDD (degenerative disc disease), cervical   . Depression   .  Dilated aortic root ([place])    71m on echo 05/2016  . Dysrhythmia    A-Fib  cardioverted on 10-11-2016  . Edema of left lower extremity   . Facial basal cell cancer   . H/O acne vulgaris 1960s   [facility]"  . Headache    history of - left temporal- years ago- not current (08/15/2015)  . Persistent atrial fibrillation (HCC)    on coumadin with CHADS2VASC score of 2  . Pneumonia 1960s; 2015 X 2  . Prostate cancer (HTurpin dx'd early 2000s   "low spreading; non aggressive type" (08/15/2015)  . Pulmonary HTN (HBalcones Heights    PASP 461mg by echo 05/2016  . Skin cancer    "back"    Past Surgical History:  Procedure Laterality Date  . CARDIOVERSION N/A 10/11/2016   Procedure: CARDIOVERSION;  Surgeon: CrSanda KleinMD;  [facility];  Service: Cardiovascular;  Laterality: N/A;  . COLONOSCOPY    . EXCISIONAL HEMORRHOIDECTOMY  1960s  . INGUINAL HERNIA REPAIR Right 2002  . INGUINAL HERNIA REPAIR Bilateral 08/15/2015  . INGUINAL HERNIA REPAIR Bilateral 08/15/2015   Procedure: OPEN REPAIR RECURRENT RIGHT INGUINAL HERNIA WITH MESH AND REPAIR LEFT INGUINAL HERNIA WITH MESH;  Surgeon: HaFanny SkatesMD;  [facility] Service: General;  Laterality: Bilateral;  . INSERTION OF MESH Bilateral 08/15/2015   Procedure: INSERTION OF MESH;  Surgeon: HaFanny SkatesMD;  Location: [facility] Service: General;  Laterality: Bilateral;  . LYMPH NODE BIOPSY Bilateral 10/22/2016  Procedure: LEFT NECK LYMPH NODE BIOPSY;  Surgeon: [name], MD;  Location: [facility];  Service: Thoracic;  Laterality: Bilateral;  . PROSTATE BIOPSY    . TONSILLECTOMY  1950s  . VIDEO ASSISTED THORACOSCOPY (VATS)/EMPYEMA Right 05/20/2016   Procedure: VIDEO ASSISTED THORACOSCOPY (VATS) with drainage of pleural effusion;  Surgeon: [name], MD;  Location: [facility];  Service: Thoracic;  Laterality: Right;  [name] VIDEO BRONCHOSCOPY WITH ENDOBRONCHIAL ULTRASOUND Right 05/20/2016   Procedure: VIDEO  BRONCHOSCOPY WITH ENDOBRONCHIAL ULTRASOUND;  Surgeon: [name], MD;  Location[facility];  Service: Thoracic;  Laterality: Right;    Family History  Problem Relation Age of Onset  . Cancer Mother        colon cancer  . Alcoholism Father   . CAD Father   . Alcohol abuse Father   . Ovarian cancer Sister   . Diabetes Neg Hx     Social History Social History  Substance Use Topics  . Smoking status: Former Smoker    Packs/day: 1.00    Years: 62.00    Types: Cigarettes    Quit date: 05/14/2016  . Smokeless tobacco: Never Used  . Alcohol use No    Current Outpatient Prescriptions  Medication Sig Dispense Refill  . Ascorbic Acid (VITAMIN C) 1000 MG tablet Take 1,000 mg by mouth 2 (two) times daily.    . furosemide (LASIX) 40 MG tablet Take 1 tablet (40 mg total) by mouth daily. 30 tablet 3  . Probiotic Product (SOLUBLE FIBER/PROBIOTICS PO) Take 2 capsules by mouth 3 (three) times a week.     . rivaroxaban (XARELTO) 20 MG TABS tablet Take 1 tablet (20 mg total) by mouth daily with supper. 30 tablet 5  . traMADol (ULTRAM) 50 MG tablet Take 1 tablet (50 mg total) by mouth at bedtime as needed for moderate pain. 30 tablet 0   No current facility-administered medications for this visit.     Allergies  Allergen Reactions  . Demerol [Meperidine] Other (See Comments)    UNSPECIFIED REACTION  Causes system to shutdown. ?   . Oxycodone Hcl Other (See Comments)    Pt states this medication 'wires him up' and makes pt hyper; pt does not want to take this ever again    Review of Systems  The patient had a fall in his yard and has a superficial scratch on his back where he hit the patio stone work.  BP (!) 90/57   Pulse 83   Resp 20   Ht _0  (1.803 m)   Wt 157 lb 6.4 oz (71.4 kg)   SpO2 99%   BMI 21.95 kg/m  Physical Exam      Exam    General- alert and comfortable   Neck-skin sutures removed. Neck incision well-healed without hematoma   Lungs- clear without rales,  wheezes. Well-healed right VATS incision   Cor- atrial fibrillation by rhythm strip with a regular rate and rhythm, no murmur , gallop   Abdomen- soft, non-tender   Extremities - warm, non-tender, minimal edema   Neuro- oriented, appropriate, no focal weakness   Diagnostic Tests: PET scan results counseled patient and wife. No evidence of lung cancer. Minimal activity of the subcarinal station with SUV 2.3 suspicious for prostate cancer according to radiology report  Impression: Patient encouraged to continue with his rehabilitative activity and diet Patient encouraged to contact his urologist for assessment of possible increased activity of his known prostate cancer.  Plan: Return in 2 months for  follow-up and assessment.   Len Childs, MD [facility] [phone]

## 2016-11-02 LAB — PSA: PSA: 1.7 ng/mL (ref <=4.0)

## 2016-11-07 ENCOUNTER — Other Ambulatory Visit: Payer: Self-pay | Admitting: *Deleted

## 2016-11-07 DIAGNOSIS — G8928 Other chronic postprocedural pain: Secondary | ICD-10-CM

## 2016-11-07 DIAGNOSIS — G8918 Other acute postprocedural pain: Secondary | ICD-10-CM

## 2016-11-07 MED ORDER — TRAMADOL HCL 50 MG PO TABS: 50.0000 mg | ORAL_TABLET | Freq: Every evening | ORAL | 0 refills | Status: DC | PRN

## 2016-11-07 NOTE — Telephone Encounter (Signed)
Received a fax from Mr. Notarianni's pharmacy requesting a Tramadol refill for his chronic post op pain. Upon review, he had received his last refill of #20 from his PCP. I discussed this with Dr. Prescott Gum and he signed for one additional refill of #30. I called Mr. Dines to inform him that this would be his last refill and encouraged him to wean himself off this medication. His wife also voiced this suggestion. The script was faxed.

## 2016-11-11 ENCOUNTER — Telehealth: Payer: Self-pay | Admitting: Cardiology

## 2016-11-11 NOTE — Telephone Encounter (Signed)
Patient states his feet have been swollen for 2 days- swollen enough he had a hard time getting his shoes on. He reports Dr. Prescott Gum instructed him to stop spironolactone and decrease Lasix to 40 mg MWF at his last OV 6/22. He also reports Dr. Prescott Gum informed him he is still in atrial fibrillation.  He reports no symptoms other than swelling. He denies CP and says his breathing has improved. He states he has been limiting salt (he still does not have much of an appetite). He wears compression stockings.  He does not weigh daily. He checks his BP and it has been "fine" but he does not trust the machine. Creatinine 1.01 on 6/7. Patient will take an extra Lasix 40 mg today and 40 mg tomorrow and resume MWF schedule on Wednesday. He will call Friday to follow-up to see how he is doing. He understands to call prior to that time if symptoms worsen/persist.   Med list updated.

## 2016-11-11 NOTE — Telephone Encounter (Signed)
New message   Pt c/o swelling: STAT is pt has developed SOB within 24 hours  1. How long have you been experiencing swelling? Since yesterday  2. Where is the swelling located? fet  3.  Are you currently taking a "fluid pill"? Yes furosemide  4.  Are you currently SOB? no  5.  Have you traveled recently? no

## 2016-11-11 NOTE — Telephone Encounter (Signed)
I would like him seen this week by extender

## 2016-11-12 NOTE — Telephone Encounter (Signed)
Patient states his swelling has improved some after taking the extra dose of Lasix last night and this morning. Per Dr. Radford Pax, scheduled patient for evaluation with L. Rosalyn Gess, PA this Friday (there are no availabilities at [facility]  Gave patient office address and he has no questions. Patient agrees with treatment plan.

## 2016-11-15 ENCOUNTER — Ambulatory Visit (INDEPENDENT_AMBULATORY_CARE_PROVIDER_SITE_OTHER): Payer: Medicare Other | Admitting: Cardiology

## 2016-11-15 ENCOUNTER — Encounter: Payer: Self-pay | Admitting: Cardiology

## 2016-11-15 VITALS — BP 102/58 | HR 72 | Ht 71.0 in | Wt 165.4 lb

## 2016-11-15 DIAGNOSIS — R600 Localized edema: Secondary | ICD-10-CM

## 2016-11-15 DIAGNOSIS — G8928 Other chronic postprocedural pain: Secondary | ICD-10-CM | POA: Insufficient documentation

## 2016-11-15 DIAGNOSIS — Z79899 Other long term (current) drug therapy: Secondary | ICD-10-CM

## 2016-11-15 DIAGNOSIS — I480 Paroxysmal atrial fibrillation: Secondary | ICD-10-CM | POA: Diagnosis not present

## 2016-11-15 DIAGNOSIS — I481 Persistent atrial fibrillation: Secondary | ICD-10-CM

## 2016-11-15 DIAGNOSIS — I272 Pulmonary hypertension, unspecified: Secondary | ICD-10-CM

## 2016-11-15 DIAGNOSIS — I4819 Other persistent atrial fibrillation: Secondary | ICD-10-CM

## 2016-11-15 DIAGNOSIS — Z8546 Personal history of malignant neoplasm of prostate: Secondary | ICD-10-CM

## 2016-11-15 DIAGNOSIS — J431 Panlobular emphysema: Secondary | ICD-10-CM

## 2016-11-15 MED ORDER — SPIRONOLACTONE 25 MG PO TABS: 12.5000 mg | ORAL_TABLET | Freq: Every day | ORAL | 3 refills | Status: DC

## 2016-11-15 NOTE — Assessment & Plan Note (Signed)
Pt is back in atrial fibrillation with CVR- s/p DCCV 10/11/16.

## 2016-11-15 NOTE — Assessment & Plan Note (Signed)
Pt had lymph node biopsy by Dr Lucianne Lei Tright 10/22/16 (negative for cancer)

## 2016-11-15 NOTE — Assessment & Plan Note (Signed)
Pt is a little frustrated that he continues to have post operative pain. He is taking Ultram sparingly

## 2016-11-15 NOTE — Assessment & Plan Note (Signed)
PASP 39mHg by echo 05/2016-this in combination may be contributing to his edema, Rt heart failure

## 2016-11-15 NOTE — Assessment & Plan Note (Signed)
Pt seen in the office today with complaints of increased LE edema- his diuretics had been cut back secondary to low B/P two weeks prior

## 2016-11-15 NOTE — Assessment & Plan Note (Signed)
His urologist in [place] retired and he will f/u with [facility]

## 2016-11-15 NOTE — Progress Notes (Signed)
11/15/2016 [name]   [birth year]  [id number]  Primary Physician Nche, Charlene Brooke, NP Primary Cardiologist: Dr Radford Pax  HPI:  71 y/o male with COPD with pulmonary HTN, h/o prostate CA, and PAF. He is on Xarelto. He underwent OP DCCV 10/11/16. He had an empyema in Jan 2018 and underwent Rt VATS 05/20/16. He was noted to have lymphadenopathy and Dr Nils Pyle was concerned he had underlying lung CA. The pt underwent Lymph node biopsy 10/22/16 which was negative for cancer. He saw Dr Lucianne Lei Tright in follow up and was noted to be in AF and hypotensive. His diuretics were cut back and he then developed LE edema. He was put back on daily Lasix a few days ago and his edema got a little better. His Aldactone 25 mg daily was not resumed.   The pt voiced multiple complaints to me. He is frustrated that he has post operative pain that's not getting better. He says he could tell no difference in his stamina after his cardioversion. He says he is unable to tell he I in AF. He has persistent anorexia, "nothing taste good".  He only takes his Ultram Q HS.    Current Outpatient Prescriptions  Medication Sig Dispense Refill  . Ascorbic Acid (VITAMIN C) 1000 MG tablet Take 1,000 mg by mouth 2 (two) times daily.    . furosemide (LASIX) 40 MG tablet Take 40 mg by mouth daily.     . Probiotic Product (SOLUBLE FIBER/PROBIOTICS PO) Take 2 capsules by mouth 3 (three) times a week.     . rivaroxaban (XARELTO) 20 MG TABS tablet Take 1 tablet (20 mg total) by mouth daily with supper. 30 tablet 5  . traMADol (ULTRAM) 50 MG tablet Take 1 tablet (50 mg total) by mouth at bedtime as needed for moderate pain. 30 tablet 0  . spironolactone (ALDACTONE) 25 MG tablet Take 0.5 tablets (12.5 mg total) by mouth daily. 45 tablet 3   No current facility-administered medications for this visit.     Allergies  Allergen Reactions  . Demerol [Meperidine] Other (See Comments)    UNSPECIFIED REACTION  Causes system to shutdown. ?    . Oxycodone Hcl Other (See Comments)    Pt states this medication 'wires him up' and makes pt hyper; pt does not want to take this ever again    Past Medical History:  Diagnosis Date  . Anxiety   . Arthritis    "neck" (08/15/2015)  . Chronic bronchitis ([place])   . Chronic diastolic CHF (congestive heart failure) ([place])   . Constipation   . COPD (chronic obstructive pulmonary disease) ([place])   . DDD (degenerative disc disease), cervical   . Depression   . Dilated aortic root ([place])    20m on echo 05/2016  . Dysrhythmia    A-Fib  cardioverted on 10-11-2016  . Edema of left lower extremity   . Facial basal cell cancer   . H/O acne vulgaris 1960s   [facility]  . Headache    history of - left temporal- years ago- not current (08/15/2015)  . Persistent atrial fibrillation (HCC)    on coumadin with CHADS2VASC score of 2  . Pneumonia 1960s; 2015 X 2  . [place] 2000[place]   "low spreading; non aggressive type" (08/15/2015)  . Pulmonary HTN ([place]    PASP 457mg by echo 05/2016  . Skin cancer    "back"  Social History   Social History  . Marital status: Married    Spouse name: N/A  . Number of children: N/A  . Years of education: N/A   Occupational History  . Not on file.   Social History Main Topics  . Smoking status: Former Smoker    Packs/day: 1.00    Years: 62.00    Types: Cigarettes    Quit date: 05/14/2016  . Smokeless tobacco: Never Used  . Alcohol use No  . Drug use: No  . Sexual activity: Not Currently   Other Topics Concern  . Not on file   Social History Narrative  . No narrative on file     Family History  Problem Relation Age of Onset  . Cancer Mother        colon cancer  . Alcoholism Father   . CAD Father   . Alcohol abuse Father   . Ovarian cancer Sister   . Diabetes Neg Hx      Review of Systems: General: negative for chills, fever, night sweats or weight changes.  Cardiovascular:  negative for chest pain, dyspnea on exertion, edema, orthopnea, palpitations, paroxysmal nocturnal dyspnea or shortness of breath Dermatological: negative for rash Respiratory: negative for cough or wheezing Urologic: negative for hematuria Abdominal: negative for nausea, vomiting, diarrhea, bright red blood per rectum, melena, or hematemesis Neurologic: negative for visual changes, syncope, or dizziness All other systems reviewed and are otherwise negative except as noted above.    Blood pressure (!) 102/58, pulse 72, height 5' 11" (1.803 m), weight 165 lb 6.4 oz (75 kg).  General appearance: alert, cooperative, no distress and chronically ill appearing Neck: JVD noted Lungs: clear to auscultation bilaterally Heart: irregularly irregular rhythm Extremities: 2+ bilateral pre tibial pitting edema Skin: pale, cool, dry Neurologic: Grossly normal  EKG AF with VR 72. Diffuse TWI, incomplete RBBB  ASSESSMENT AND PLAN:   Bilateral lower extremity edema Pt seen in the office today with complaints of increased LE edema- his diuretics had been cut back secondary to low B/P two weeks prior  Persistent atrial fibrillation ([place]) Pt is back in atrial fibrillation with CVR- s/p DCCV 10/11/16.   Pulmonary HTN PASP 28mHg by echo 05/2016-this in combination may be contributing to his edema, Rt heart failure  COPD (chronic obstructive pulmonary disease) (HCC) Pt had lymph node biopsy by Dr VLucianne LeiTright 10/22/16 (negative for cancer)  History of [place] f[place]u with [facility]  Chronic post-operative pain Pt is a little frustrated that he continues to have post operative pain. He is taking Ultram sparingly   PLAN  I suggested we resume Aldactone at a lower dose- 12.5 mg daily. I wonder if some of his anorexia is from hepatic congestion from Rt sided CHF. His RV and RA were dilated on echo in Jan- PA pressure was 43 mmHg. [facility] He'l have a BMP before this. Long term Rx of his AF will need to be discussed with Dr TRadford Pax   LKerin RansomPA-C 11/15/2016 11:41 AM

## 2016-11-15 NOTE — Patient Instructions (Signed)
Medication Instructions:  START spironolactone (Aldactone) 12.84m (1/2 tablet) daily.  Labwork: BM[facility] day [facility] You may go at anytime that day-no appt needed.   Testing/Procedures: NONE  Follow-Up: Your physician recommends that you schedule a follow-up appointment in: [facility]     Any Other Special Instructions Will Be Listed Below (If Applicable).     If you need a refill on your cardiac medications before your next appointment, please call your pharmacy.

## 2016-11-16 ENCOUNTER — Telehealth: Payer: Self-pay | Admitting: Student

## 2016-11-16 NOTE — Telephone Encounter (Signed)
  Patient's wife called reporting he has experienced worsening lower extremity edema. This was evaluated by Kerin Ransom, PA-C on 7/6, but she reports symptoms have continued to worsen despite him starting Spironolactone 12.51m daily. Weight has been stable at 165 lbs. He denies any associated chest pain, PND, or orthopnea. Has been consuming increased sodium.   His wife assessed for edema over the phone and reports he had a notable indention up to his mid-shins. Will increase Lasix from 442mAM to 4053mn AM and 51m10m PM. Scheduled for repeat BMET next week. Reviewed sodium and fluid restriction ( < 2L per day) with the patient. Continued compliance with compression stockings encouraged.   She voiced understanding of this and was appreciative of the call.   Signed, BritErma Heritage-C 11/16/2016, 4:34 PM Pager: [phone]

## 2016-11-17 ENCOUNTER — Inpatient Hospital Stay (HOSPITAL_COMMUNITY)
Admission: EM | Admit: 2016-11-17 | Discharge: 2016-11-24 | DRG: 871 | Disposition: A | Discharge status: Home Health | Payer: Medicare Other | Attending: Internal Medicine | Admitting: Internal Medicine

## 2016-11-17 ENCOUNTER — Encounter (HOSPITAL_COMMUNITY): Payer: Self-pay | Admitting: Emergency Medicine

## 2016-11-17 DIAGNOSIS — R0602 Shortness of breath: Secondary | ICD-10-CM

## 2016-11-17 DIAGNOSIS — E871 Hypo-osmolality and hyponatremia: Secondary | ICD-10-CM | POA: Diagnosis present

## 2016-11-17 DIAGNOSIS — B953 Streptococcus pneumoniae as the cause of diseases classified elsewhere: Secondary | ICD-10-CM | POA: Diagnosis present

## 2016-11-17 DIAGNOSIS — E46 Unspecified protein-calorie malnutrition: Secondary | ICD-10-CM | POA: Diagnosis not present

## 2016-11-17 DIAGNOSIS — N289 Disorder of kidney and ureter, unspecified: Secondary | ICD-10-CM

## 2016-11-17 DIAGNOSIS — Z7901 Long term (current) use of anticoagulants: Secondary | ICD-10-CM | POA: Diagnosis not present

## 2016-11-17 DIAGNOSIS — J431 Panlobular emphysema: Secondary | ICD-10-CM

## 2016-11-17 DIAGNOSIS — E43 Unspecified severe protein-calorie malnutrition: Secondary | ICD-10-CM | POA: Diagnosis not present

## 2016-11-17 DIAGNOSIS — I272 Pulmonary hypertension, unspecified: Secondary | ICD-10-CM | POA: Diagnosis not present

## 2016-11-17 DIAGNOSIS — Z85828 Personal history of other malignant neoplasm of skin: Secondary | ICD-10-CM

## 2016-11-17 DIAGNOSIS — J869 Pyothorax without fistula: Secondary | ICD-10-CM | POA: Diagnosis present

## 2016-11-17 DIAGNOSIS — M79604 Pain in right leg: Secondary | ICD-10-CM | POA: Diagnosis not present

## 2016-11-17 DIAGNOSIS — Z8249 Family history of ischemic heart disease and other diseases of the circulatory system: Secondary | ICD-10-CM

## 2016-11-17 DIAGNOSIS — J9620 Acute and chronic respiratory failure, unspecified whether with hypoxia or hypercapnia: Secondary | ICD-10-CM | POA: Diagnosis present

## 2016-11-17 DIAGNOSIS — J90 Pleural effusion, not elsewhere classified: Secondary | ICD-10-CM | POA: Diagnosis not present

## 2016-11-17 DIAGNOSIS — I5032 Chronic diastolic (congestive) heart failure: Secondary | ICD-10-CM | POA: Diagnosis not present

## 2016-11-17 DIAGNOSIS — R52 Pain, unspecified: Secondary | ICD-10-CM

## 2016-11-17 DIAGNOSIS — C61 Malignant neoplasm of prostate: Secondary | ICD-10-CM | POA: Diagnosis not present

## 2016-11-17 DIAGNOSIS — Z79899 Other long term (current) drug therapy: Secondary | ICD-10-CM

## 2016-11-17 DIAGNOSIS — M79661 Pain in right lower leg: Secondary | ICD-10-CM | POA: Diagnosis not present

## 2016-11-17 DIAGNOSIS — R338 Other retention of urine: Secondary | ICD-10-CM

## 2016-11-17 DIAGNOSIS — Z8701 Personal history of pneumonia (recurrent): Secondary | ICD-10-CM | POA: Diagnosis not present

## 2016-11-17 DIAGNOSIS — J440 Chronic obstructive pulmonary disease with acute lower respiratory infection: Secondary | ICD-10-CM | POA: Diagnosis present

## 2016-11-17 DIAGNOSIS — I071 Rheumatic tricuspid insufficiency: Secondary | ICD-10-CM | POA: Diagnosis not present

## 2016-11-17 DIAGNOSIS — C771 Secondary and unspecified malignant neoplasm of intrathoracic lymph nodes: Secondary | ICD-10-CM | POA: Diagnosis present

## 2016-11-17 DIAGNOSIS — Z9181 History of falling: Secondary | ICD-10-CM | POA: Diagnosis not present

## 2016-11-17 DIAGNOSIS — R601 Generalized edema: Secondary | ICD-10-CM

## 2016-11-17 DIAGNOSIS — F17211 Nicotine dependence, cigarettes, in remission: Secondary | ICD-10-CM | POA: Diagnosis not present

## 2016-11-17 DIAGNOSIS — Z885 Allergy status to narcotic agent status: Secondary | ICD-10-CM | POA: Diagnosis not present

## 2016-11-17 DIAGNOSIS — Z87891 Personal history of nicotine dependence: Secondary | ICD-10-CM

## 2016-11-17 DIAGNOSIS — I480 Paroxysmal atrial fibrillation: Secondary | ICD-10-CM | POA: Diagnosis not present

## 2016-11-17 DIAGNOSIS — I5082 Biventricular heart failure: Secondary | ICD-10-CM | POA: Diagnosis present

## 2016-11-17 DIAGNOSIS — N179 Acute kidney failure, unspecified: Secondary | ICD-10-CM | POA: Diagnosis not present

## 2016-11-17 DIAGNOSIS — R339 Retention of urine, unspecified: Secondary | ICD-10-CM | POA: Diagnosis present

## 2016-11-17 DIAGNOSIS — F419 Anxiety disorder, unspecified: Secondary | ICD-10-CM | POA: Diagnosis present

## 2016-11-17 DIAGNOSIS — Z8546 Personal history of malignant neoplasm of prostate: Secondary | ICD-10-CM | POA: Diagnosis not present

## 2016-11-17 DIAGNOSIS — Z9889 Other specified postprocedural states: Secondary | ICD-10-CM

## 2016-11-17 DIAGNOSIS — D649 Anemia, unspecified: Secondary | ICD-10-CM | POA: Diagnosis present

## 2016-11-17 DIAGNOSIS — I2781 Cor pulmonale (chronic): Secondary | ICD-10-CM | POA: Diagnosis not present

## 2016-11-17 DIAGNOSIS — A403 Sepsis due to Streptococcus pneumoniae: Principal | ICD-10-CM | POA: Diagnosis present

## 2016-11-17 DIAGNOSIS — B954 Other streptococcus as the cause of diseases classified elsewhere: Secondary | ICD-10-CM | POA: Diagnosis not present

## 2016-11-17 DIAGNOSIS — J189 Pneumonia, unspecified organism: Secondary | ICD-10-CM | POA: Diagnosis not present

## 2016-11-17 DIAGNOSIS — J9601 Acute respiratory failure with hypoxia: Secondary | ICD-10-CM | POA: Diagnosis not present

## 2016-11-17 DIAGNOSIS — J449 Chronic obstructive pulmonary disease, unspecified: Secondary | ICD-10-CM | POA: Diagnosis present

## 2016-11-17 DIAGNOSIS — I5033 Acute on chronic diastolic (congestive) heart failure: Secondary | ICD-10-CM | POA: Diagnosis present

## 2016-11-17 DIAGNOSIS — I5043 Acute on chronic combined systolic (congestive) and diastolic (congestive) heart failure: Secondary | ICD-10-CM | POA: Diagnosis present

## 2016-11-17 DIAGNOSIS — Z8709 Personal history of other diseases of the respiratory system: Secondary | ICD-10-CM | POA: Diagnosis not present

## 2016-11-17 DIAGNOSIS — I482 Chronic atrial fibrillation: Secondary | ICD-10-CM | POA: Diagnosis not present

## 2016-11-17 DIAGNOSIS — I4819 Other persistent atrial fibrillation: Secondary | ICD-10-CM

## 2016-11-17 DIAGNOSIS — I890 Lymphedema, not elsewhere classified: Secondary | ICD-10-CM | POA: Diagnosis not present

## 2016-11-17 DIAGNOSIS — I481 Persistent atrial fibrillation: Secondary | ICD-10-CM | POA: Diagnosis not present

## 2016-11-17 DIAGNOSIS — R7881 Bacteremia: Secondary | ICD-10-CM | POA: Diagnosis not present

## 2016-11-17 DIAGNOSIS — J690 Pneumonitis due to inhalation of food and vomit: Secondary | ICD-10-CM | POA: Diagnosis present

## 2016-11-17 DIAGNOSIS — A419 Sepsis, unspecified organism: Secondary | ICD-10-CM

## 2016-11-17 DIAGNOSIS — D72829 Elevated white blood cell count, unspecified: Secondary | ICD-10-CM

## 2016-11-17 DIAGNOSIS — Z8041 Family history of malignant neoplasm of ovary: Secondary | ICD-10-CM

## 2016-11-17 DIAGNOSIS — E8809 Other disorders of plasma-protein metabolism, not elsewhere classified: Secondary | ICD-10-CM

## 2016-11-17 DIAGNOSIS — Y95 Nosocomial condition: Secondary | ICD-10-CM | POA: Diagnosis present

## 2016-11-17 LAB — CBC
HEMATOCRIT: 38.8 % — AB (ref 39.0–52.0)
HEMOGLOBIN: 12.7 g/dL — AB (ref 13.0–17.0)
MCH: 29.7 pg (ref 26.0–34.0)
MCHC: 32.7 g/dL (ref 30.0–36.0)
MCV: 90.7 fL (ref 78.0–100.0)
Platelets: 409 10*3/uL — ABNORMAL HIGH (ref 150–400)
RBC: 4.28 MIL/uL (ref 4.22–5.81)
RDW: 14.8 % (ref 11.5–15.5)
WBC: 25.3 10*3/uL — AB (ref 4.0–10.5)

## 2016-11-17 LAB — BASIC METABOLIC PANEL
ANION GAP: 10 (ref 5–15)
BUN: 40 mg/dL — ABNORMAL HIGH (ref 6–20)
CALCIUM: 8.3 mg/dL — AB (ref 8.9–10.3)
CHLORIDE: 89 mmol/L — AB (ref 101–111)
CO2: 24 mmol/L (ref 22–32)
Creatinine, Ser: 1.48 mg/dL — ABNORMAL HIGH (ref 0.61–1.24)
GFR calc non Af Amer: 45 mL/min — ABNORMAL LOW (ref >60)
GFR, EST AFRICAN AMERICAN: 52 mL/min — AB (ref >60)
Glucose, Bld: 108 mg/dL — ABNORMAL HIGH (ref 65–99)
POTASSIUM: 4.8 mmol/L (ref 3.5–5.1)
Sodium: 123 mmol/L — ABNORMAL LOW (ref 135–145)

## 2016-11-17 NOTE — ED Triage Notes (Signed)
Reports swelling in ble for a couple of days worse than usual.  Reports taking 67m of lasix instead of 470m  Reports voiding less than usual.  No c/o bladder fullness.

## 2016-11-17 NOTE — ED Notes (Signed)
Pt family at nurses station requesting update and stating that he is in pain. Apologized for delay. Offered ibuprofen/percocet but declined d/t allergies.

## 2016-11-18 ENCOUNTER — Encounter (HOSPITAL_COMMUNITY): Payer: Self-pay | Admitting: Internal Medicine

## 2016-11-18 ENCOUNTER — Inpatient Hospital Stay (HOSPITAL_COMMUNITY): Payer: Medicare Other

## 2016-11-18 ENCOUNTER — Emergency Department (HOSPITAL_COMMUNITY): Payer: Medicare Other

## 2016-11-18 DIAGNOSIS — R601 Generalized edema: Secondary | ICD-10-CM | POA: Diagnosis not present

## 2016-11-18 DIAGNOSIS — Z8709 Personal history of other diseases of the respiratory system: Secondary | ICD-10-CM | POA: Diagnosis not present

## 2016-11-18 DIAGNOSIS — F17211 Nicotine dependence, cigarettes, in remission: Secondary | ICD-10-CM | POA: Diagnosis not present

## 2016-11-18 DIAGNOSIS — Z885 Allergy status to narcotic agent status: Secondary | ICD-10-CM | POA: Diagnosis not present

## 2016-11-18 DIAGNOSIS — Z7901 Long term (current) use of anticoagulants: Secondary | ICD-10-CM | POA: Diagnosis not present

## 2016-11-18 DIAGNOSIS — J869 Pyothorax without fistula: Secondary | ICD-10-CM | POA: Diagnosis not present

## 2016-11-18 DIAGNOSIS — M79604 Pain in right leg: Secondary | ICD-10-CM | POA: Diagnosis present

## 2016-11-18 DIAGNOSIS — Z87891 Personal history of nicotine dependence: Secondary | ICD-10-CM | POA: Diagnosis not present

## 2016-11-18 DIAGNOSIS — N289 Disorder of kidney and ureter, unspecified: Secondary | ICD-10-CM

## 2016-11-18 DIAGNOSIS — R338 Other retention of urine: Secondary | ICD-10-CM | POA: Diagnosis not present

## 2016-11-18 DIAGNOSIS — I509 Heart failure, unspecified: Secondary | ICD-10-CM | POA: Diagnosis not present

## 2016-11-18 DIAGNOSIS — I071 Rheumatic tricuspid insufficiency: Secondary | ICD-10-CM | POA: Diagnosis present

## 2016-11-18 DIAGNOSIS — J431 Panlobular emphysema: Secondary | ICD-10-CM | POA: Diagnosis not present

## 2016-11-18 DIAGNOSIS — J440 Chronic obstructive pulmonary disease with acute lower respiratory infection: Secondary | ICD-10-CM | POA: Diagnosis present

## 2016-11-18 DIAGNOSIS — I2781 Cor pulmonale (chronic): Secondary | ICD-10-CM | POA: Diagnosis present

## 2016-11-18 DIAGNOSIS — I480 Paroxysmal atrial fibrillation: Secondary | ICD-10-CM | POA: Diagnosis not present

## 2016-11-18 DIAGNOSIS — J90 Pleural effusion, not elsewhere classified: Secondary | ICD-10-CM | POA: Diagnosis not present

## 2016-11-18 DIAGNOSIS — M1711 Unilateral primary osteoarthritis, right knee: Secondary | ICD-10-CM | POA: Diagnosis not present

## 2016-11-18 DIAGNOSIS — J189 Pneumonia, unspecified organism: Secondary | ICD-10-CM | POA: Diagnosis present

## 2016-11-18 DIAGNOSIS — I361 Nonrheumatic tricuspid (valve) insufficiency: Secondary | ICD-10-CM | POA: Diagnosis not present

## 2016-11-18 DIAGNOSIS — I5032 Chronic diastolic (congestive) heart failure: Secondary | ICD-10-CM | POA: Diagnosis not present

## 2016-11-18 DIAGNOSIS — Z8701 Personal history of pneumonia (recurrent): Secondary | ICD-10-CM | POA: Diagnosis not present

## 2016-11-18 DIAGNOSIS — E871 Hypo-osmolality and hyponatremia: Secondary | ICD-10-CM | POA: Diagnosis present

## 2016-11-18 DIAGNOSIS — C771 Secondary and unspecified malignant neoplasm of intrathoracic lymph nodes: Secondary | ICD-10-CM | POA: Diagnosis present

## 2016-11-18 DIAGNOSIS — I5082 Biventricular heart failure: Secondary | ICD-10-CM | POA: Diagnosis present

## 2016-11-18 DIAGNOSIS — A403 Sepsis due to Streptococcus pneumoniae: Secondary | ICD-10-CM | POA: Diagnosis present

## 2016-11-18 DIAGNOSIS — C61 Malignant neoplasm of prostate: Secondary | ICD-10-CM | POA: Diagnosis present

## 2016-11-18 DIAGNOSIS — D72829 Elevated white blood cell count, unspecified: Secondary | ICD-10-CM

## 2016-11-18 DIAGNOSIS — I272 Pulmonary hypertension, unspecified: Secondary | ICD-10-CM | POA: Diagnosis present

## 2016-11-18 DIAGNOSIS — I482 Chronic atrial fibrillation: Secondary | ICD-10-CM | POA: Diagnosis present

## 2016-11-18 DIAGNOSIS — Z9181 History of falling: Secondary | ICD-10-CM | POA: Diagnosis not present

## 2016-11-18 DIAGNOSIS — Y95 Nosocomial condition: Secondary | ICD-10-CM | POA: Diagnosis present

## 2016-11-18 DIAGNOSIS — Z85828 Personal history of other malignant neoplasm of skin: Secondary | ICD-10-CM | POA: Diagnosis not present

## 2016-11-18 DIAGNOSIS — J9601 Acute respiratory failure with hypoxia: Secondary | ICD-10-CM

## 2016-11-18 DIAGNOSIS — R609 Edema, unspecified: Secondary | ICD-10-CM | POA: Diagnosis not present

## 2016-11-18 DIAGNOSIS — I5033 Acute on chronic diastolic (congestive) heart failure: Secondary | ICD-10-CM | POA: Diagnosis not present

## 2016-11-18 DIAGNOSIS — E46 Unspecified protein-calorie malnutrition: Secondary | ICD-10-CM | POA: Diagnosis not present

## 2016-11-18 DIAGNOSIS — N179 Acute kidney failure, unspecified: Secondary | ICD-10-CM | POA: Diagnosis not present

## 2016-11-18 DIAGNOSIS — I890 Lymphedema, not elsewhere classified: Secondary | ICD-10-CM | POA: Diagnosis not present

## 2016-11-18 DIAGNOSIS — R05 Cough: Secondary | ICD-10-CM | POA: Diagnosis not present

## 2016-11-18 DIAGNOSIS — M79661 Pain in right lower leg: Secondary | ICD-10-CM | POA: Diagnosis not present

## 2016-11-18 DIAGNOSIS — R0602 Shortness of breath: Secondary | ICD-10-CM

## 2016-11-18 DIAGNOSIS — B954 Other streptococcus as the cause of diseases classified elsewhere: Secondary | ICD-10-CM | POA: Diagnosis not present

## 2016-11-18 DIAGNOSIS — J690 Pneumonitis due to inhalation of food and vomit: Secondary | ICD-10-CM | POA: Diagnosis present

## 2016-11-18 DIAGNOSIS — Z8546 Personal history of malignant neoplasm of prostate: Secondary | ICD-10-CM | POA: Diagnosis not present

## 2016-11-18 DIAGNOSIS — F419 Anxiety disorder, unspecified: Secondary | ICD-10-CM | POA: Diagnosis present

## 2016-11-18 DIAGNOSIS — R5381 Other malaise: Secondary | ICD-10-CM | POA: Diagnosis not present

## 2016-11-18 DIAGNOSIS — R7881 Bacteremia: Secondary | ICD-10-CM | POA: Diagnosis not present

## 2016-11-18 DIAGNOSIS — M1611 Unilateral primary osteoarthritis, right hip: Secondary | ICD-10-CM | POA: Diagnosis not present

## 2016-11-18 DIAGNOSIS — I481 Persistent atrial fibrillation: Secondary | ICD-10-CM | POA: Diagnosis not present

## 2016-11-18 DIAGNOSIS — D649 Anemia, unspecified: Secondary | ICD-10-CM | POA: Diagnosis present

## 2016-11-18 DIAGNOSIS — J449 Chronic obstructive pulmonary disease, unspecified: Secondary | ICD-10-CM | POA: Diagnosis not present

## 2016-11-18 DIAGNOSIS — Z79899 Other long term (current) drug therapy: Secondary | ICD-10-CM | POA: Diagnosis not present

## 2016-11-18 DIAGNOSIS — E43 Unspecified severe protein-calorie malnutrition: Secondary | ICD-10-CM | POA: Diagnosis present

## 2016-11-18 HISTORY — PX: IR THORACENTESIS ASP PLEURAL SPACE W/IMG GUIDE: IMG5380

## 2016-11-18 LAB — BLOOD CULTURE ID PANEL (REFLEXED)
Acinetobacter baumannii: NOT DETECTED
CANDIDA PARAPSILOSIS: NOT DETECTED
CANDIDA TROPICALIS: NOT DETECTED
Candida albicans: NOT DETECTED
Candida glabrata: NOT DETECTED
Candida krusei: NOT DETECTED
Enterobacter cloacae complex: NOT DETECTED
Enterobacteriaceae species: NOT DETECTED
Enterococcus species: NOT DETECTED
Escherichia coli: NOT DETECTED
HAEMOPHILUS INFLUENZAE: NOT DETECTED
KLEBSIELLA OXYTOCA: NOT DETECTED
KLEBSIELLA PNEUMONIAE: NOT DETECTED
Listeria monocytogenes: NOT DETECTED
NEISSERIA MENINGITIDIS: NOT DETECTED
Proteus species: NOT DETECTED
Pseudomonas aeruginosa: NOT DETECTED
SERRATIA MARCESCENS: NOT DETECTED
STAPHYLOCOCCUS AUREUS BCID: NOT DETECTED
STAPHYLOCOCCUS SPECIES: NOT DETECTED
STREPTOCOCCUS SPECIES: DETECTED — AB
Streptococcus agalactiae: NOT DETECTED
Streptococcus pneumoniae: DETECTED — AB
Streptococcus pyogenes: NOT DETECTED

## 2016-11-18 LAB — SODIUM, URINE, RANDOM: Sodium, Ur: <10 mmol/L

## 2016-11-18 LAB — BODY FLUID CELL COUNT WITH DIFFERENTIAL
Eos, Fluid: 0 %
Lymphs, Fluid: 12 %
Monocyte-Macrophage-Serous Fluid: 1 % — ABNORMAL LOW (ref 50–90)
NEUTROPHIL FLUID: 87 % — AB (ref 0–25)
OTHER CELLS FL: 0 %
WBC FLUID: 119 uL (ref 0–1000)

## 2016-11-18 LAB — RESPIRATORY PANEL BY PCR
ADENOVIRUS-RVPPCR: NOT DETECTED
Bordetella pertussis: NOT DETECTED
CORONAVIRUS HKU1-RVPPCR: NOT DETECTED
CORONAVIRUS NL63-RVPPCR: NOT DETECTED
CORONAVIRUS OC43-RVPPCR: NOT DETECTED
Chlamydophila pneumoniae: NOT DETECTED
Coronavirus 229E: NOT DETECTED
INFLUENZA A-RVPPCR: NOT DETECTED
Influenza B: NOT DETECTED
MYCOPLASMA PNEUMONIAE-RVPPCR: NOT DETECTED
Metapneumovirus: NOT DETECTED
PARAINFLUENZA VIRUS 1-RVPPCR: NOT DETECTED
PARAINFLUENZA VIRUS 3-RVPPCR: NOT DETECTED
PARAINFLUENZA VIRUS 4-RVPPCR: NOT DETECTED
Parainfluenza Virus 2: NOT DETECTED
Respiratory Syncytial Virus: NOT DETECTED
Rhinovirus / Enterovirus: NOT DETECTED

## 2016-11-18 LAB — STREP PNEUMONIAE URINARY ANTIGEN: STREP PNEUMO URINARY ANTIGEN: POSITIVE — AB

## 2016-11-18 LAB — URINALYSIS, ROUTINE W REFLEX MICROSCOPIC
Bilirubin Urine: NEGATIVE
GLUCOSE, UA: NEGATIVE mg/dL
Ketones, ur: NEGATIVE mg/dL
LEUKOCYTES UA: NEGATIVE
NITRITE: NEGATIVE
Protein, ur: 100 mg/dL — AB
Specific Gravity, Urine: 1.021 (ref 1.005–1.030)
pH: 5 (ref 5.0–8.0)

## 2016-11-18 LAB — HEPATIC FUNCTION PANEL
ALK PHOS: 241 U/L — AB (ref 38–126)
ALT: 21 U/L (ref 17–63)
AST: 35 U/L (ref 15–41)
Albumin: 1.7 g/dL — ABNORMAL LOW (ref 3.5–5.0)
BILIRUBIN DIRECT: 0.5 mg/dL (ref 0.1–0.5)
BILIRUBIN TOTAL: 1.1 mg/dL (ref 0.3–1.2)
Indirect Bilirubin: 0.6 mg/dL (ref 0.3–0.9)
Total Protein: 6.5 g/dL (ref 6.5–8.1)

## 2016-11-18 LAB — DIFFERENTIAL
BASOS ABS: 0 10*3/uL (ref 0.0–0.1)
Basophils Relative: 0 %
EOS ABS: 0 10*3/uL (ref 0.0–0.7)
Eosinophils Relative: 0 %
Lymphocytes Relative: 5 %
Lymphs Abs: 1.4 10*3/uL (ref 0.7–4.0)
Monocytes Absolute: 0.5 10*3/uL (ref 0.1–1.0)
Monocytes Relative: 2 %
Neutro Abs: 28.2 10*3/uL — ABNORMAL HIGH (ref 1.7–7.7)
Neutrophils Relative %: 93 %

## 2016-11-18 LAB — CBC
HCT: 35.4 % — ABNORMAL LOW (ref 39.0–52.0)
HEMOGLOBIN: 11.5 g/dL — AB (ref 13.0–17.0)
MCH: 29.6 pg (ref 26.0–34.0)
MCHC: 32.5 g/dL (ref 30.0–36.0)
MCV: 91.2 fL (ref 78.0–100.0)
Platelets: 381 10*3/uL (ref 150–400)
RBC: 3.88 MIL/uL — ABNORMAL LOW (ref 4.22–5.81)
RDW: 14.9 % (ref 11.5–15.5)
WBC: 23.9 10*3/uL — ABNORMAL HIGH (ref 4.0–10.5)

## 2016-11-18 LAB — COMPREHENSIVE METABOLIC PANEL
ALT: 16 U/L — AB (ref 17–63)
ANION GAP: 9 (ref 5–15)
AST: 41 U/L (ref 15–41)
Albumin: 1.4 g/dL — ABNORMAL LOW (ref 3.5–5.0)
Alkaline Phosphatase: 214 U/L — ABNORMAL HIGH (ref 38–126)
BILIRUBIN TOTAL: 1.8 mg/dL — AB (ref 0.3–1.2)
BUN: 39 mg/dL — ABNORMAL HIGH (ref 6–20)
CALCIUM: 7.7 mg/dL — AB (ref 8.9–10.3)
CO2: 22 mmol/L (ref 22–32)
CREATININE: 1.08 mg/dL (ref 0.61–1.24)
Chloride: 95 mmol/L — ABNORMAL LOW (ref 101–111)
Glucose, Bld: 111 mg/dL — ABNORMAL HIGH (ref 65–99)
Potassium: 5.5 mmol/L — ABNORMAL HIGH (ref 3.5–5.1)
SODIUM: 126 mmol/L — AB (ref 135–145)
TOTAL PROTEIN: 5.4 g/dL — AB (ref 6.5–8.1)

## 2016-11-18 LAB — PROTIME-INR
INR: 3.61
INR: 3.68
PROTHROMBIN TIME: 36.8 s — AB (ref 11.4–15.2)
PROTHROMBIN TIME: 37.4 s — AB (ref 11.4–15.2)

## 2016-11-18 LAB — ALBUMIN, PLEURAL OR PERITONEAL FLUID: Albumin, Fluid: <1 g/dL

## 2016-11-18 LAB — OSMOLALITY: OSMOLALITY: 278 mosm/kg (ref 275–295)

## 2016-11-18 LAB — I-STAT CG4 LACTIC ACID, ED
LACTIC ACID, VENOUS: 1.5 mmol/L (ref 0.5–1.9)
Lactic Acid, Venous: 1.77 mmol/L (ref 0.5–1.9)

## 2016-11-18 LAB — GLUCOSE, CAPILLARY: GLUCOSE-CAPILLARY: 104 mg/dL — AB (ref 65–99)

## 2016-11-18 LAB — PROTEIN, PLEURAL OR PERITONEAL FLUID

## 2016-11-18 LAB — GLUCOSE, PLEURAL OR PERITONEAL FLUID: GLUCOSE FL: 95 mg/dL

## 2016-11-18 LAB — CORTISOL: Cortisol, Plasma: 29.1 ug/dL

## 2016-11-18 LAB — BRAIN NATRIURETIC PEPTIDE: B Natriuretic Peptide: 640.7 pg/mL — ABNORMAL HIGH (ref 0.0–100.0)

## 2016-11-18 LAB — LACTATE DEHYDROGENASE, PLEURAL OR PERITONEAL FLUID: LD, Fluid: 69 U/L — ABNORMAL HIGH (ref 3–23)

## 2016-11-18 LAB — TSH: TSH: 1.662 u[IU]/mL (ref 0.350–4.500)

## 2016-11-18 LAB — HIV ANTIBODY (ROUTINE TESTING W REFLEX): HIV Screen 4th Generation wRfx: NONREACTIVE

## 2016-11-18 LAB — OSMOLALITY, URINE: OSMOLALITY UR: 562 mosm/kg (ref 300–900)

## 2016-11-18 MED ORDER — VITAMIN C 500 MG PO TABS
1000.0000 mg | ORAL_TABLET | ORAL | Status: DC
Start: 2016-11-18 — End: 2016-11-24
  Administered 2016-11-18 – 2016-11-24 (×4): 1000 mg via ORAL
  Filled 2016-11-18 (×6): qty 2

## 2016-11-18 MED ORDER — SODIUM CHLORIDE 0.9 % IV BOLUS (SEPSIS)
1000.0000 mL | Freq: Once | INTRAVENOUS | Status: AC
  Administered 2016-11-18: 1000 mL via INTRAVENOUS

## 2016-11-18 MED ORDER — TRAMADOL HCL 50 MG PO TABS
50.0000 mg | ORAL_TABLET | Freq: Every evening | ORAL | Status: DC | PRN
  Administered 2016-11-18 – 2016-11-24 (×8): 50 mg via ORAL
  Filled 2016-11-18 (×9): qty 1

## 2016-11-18 MED ORDER — LEVALBUTEROL HCL 0.63 MG/3ML IN NEBU
0.6300 mg | INHALATION_SOLUTION | Freq: Four times a day (QID) | RESPIRATORY_TRACT | Status: DC
  Administered 2016-11-18 – 2016-11-19 (×7): 0.63 mg via RESPIRATORY_TRACT
  Filled 2016-11-18 (×9): qty 3

## 2016-11-18 MED ORDER — VANCOMYCIN HCL IN DEXTROSE 1-5 GM/200ML-% IV SOLN
1000.0000 mg | Freq: Once | INTRAVENOUS | Status: AC
  Administered 2016-11-18: 1000 mg via INTRAVENOUS
  Filled 2016-11-18: qty 200

## 2016-11-18 MED ORDER — VANCOMYCIN HCL IN DEXTROSE 750-5 MG/150ML-% IV SOLN
750.0000 mg | Freq: Two times a day (BID) | INTRAVENOUS | Status: DC
  Administered 2016-11-18 – 2016-11-20 (×5): 750 mg via INTRAVENOUS
  Filled 2016-11-18 (×6): qty 150

## 2016-11-18 MED ORDER — DEXTROSE 5 % IV SOLN
1.0000 g | Freq: Three times a day (TID) | INTRAVENOUS | Status: DC
  Administered 2016-11-18 – 2016-11-19 (×4): 1 g via INTRAVENOUS
  Filled 2016-11-18 (×5): qty 1

## 2016-11-18 MED ORDER — SODIUM CHLORIDE 0.9% FLUSH: 3.0000 mL | INTRAVENOUS | Status: DC | PRN

## 2016-11-18 MED ORDER — IPRATROPIUM BROMIDE 0.02 % IN SOLN
0.5000 mg | Freq: Four times a day (QID) | RESPIRATORY_TRACT | Status: DC
  Administered 2016-11-18 – 2016-11-19 (×7): 0.5 mg via RESPIRATORY_TRACT
  Filled 2016-11-18 (×9): qty 2.5

## 2016-11-18 MED ORDER — SODIUM CHLORIDE 0.9% FLUSH
3.0000 mL | Freq: Two times a day (BID) | INTRAVENOUS | Status: DC
  Administered 2016-11-18 – 2016-11-23 (×10): 3 mL via INTRAVENOUS

## 2016-11-18 MED ORDER — PIPERACILLIN-TAZOBACTAM 3.375 G IVPB 30 MIN
3.3750 g | Freq: Once | INTRAVENOUS | Status: AC
  Administered 2016-11-18: 3.375 g via INTRAVENOUS
  Filled 2016-11-18: qty 50

## 2016-11-18 MED ORDER — LIDOCAINE HCL (PF) 1 % IJ SOLN
INTRAMUSCULAR | Status: DC | PRN
  Administered 2016-11-18: 5 mL

## 2016-11-18 MED ORDER — SODIUM CHLORIDE 0.9 % IV BOLUS (SEPSIS)
500.0000 mL | Freq: Once | INTRAVENOUS | Status: AC
  Administered 2016-11-18: 500 mL via INTRAVENOUS

## 2016-11-18 MED ORDER — SODIUM CHLORIDE 0.9 % IV BOLUS (SEPSIS)
250.0000 mL | Freq: Once | INTRAVENOUS | Status: AC
  Administered 2016-11-18: 250 mL via INTRAVENOUS

## 2016-11-18 MED ORDER — MORPHINE SULFATE (PF) 4 MG/ML IV SOLN
4.0000 mg | Freq: Once | INTRAVENOUS | Status: AC
  Administered 2016-11-18: 4 mg via INTRAVENOUS
  Filled 2016-11-18: qty 1

## 2016-11-18 MED ORDER — SODIUM CHLORIDE 0.9 % IV SOLN
INTRAVENOUS | Status: DC
  Administered 2016-11-18: 01:00:00 via INTRAVENOUS

## 2016-11-18 MED ORDER — SODIUM CHLORIDE 0.9 % IV SOLN: 250.0000 mL | INTRAVENOUS | Status: DC | PRN

## 2016-11-18 MED ORDER — RIVAROXABAN 20 MG PO TABS
20.0000 mg | ORAL_TABLET | Freq: Every day | ORAL | Status: DC
  Administered 2016-11-18 – 2016-11-19 (×2): 20 mg via ORAL
  Filled 2016-11-18 (×2): qty 1

## 2016-11-18 MED ORDER — FUROSEMIDE 40 MG PO TABS
40.0000 mg | ORAL_TABLET | Freq: Every day | ORAL | Status: DC
  Administered 2016-11-18: 40 mg via ORAL
  Filled 2016-11-18: qty 2

## 2016-11-18 MED ORDER — LIDOCAINE HCL (PF) 1 % IJ SOLN
INTRAMUSCULAR | Status: AC
  Filled 2016-11-18: qty 30

## 2016-11-18 NOTE — ED Notes (Signed)
Bladder scan reads >698 mls

## 2016-11-18 NOTE — Progress Notes (Signed)
Pharmacy Antibiotic Note  [name] is a 71 y.o. male admitted on 11/17/2016 with pneumonia.  Pharmacy has been consulted for Vancomycin dosing. WBC elevated. Noted renal dysfunction. CXR with multi-focal consolidation.   Plan: Vancomycin 750 mg IV q12h Cefepime per MD Trend WBC, temp, renal function  F/U infectious work-up Drug levels as indicated  Height: _0  (180.3 cm) Weight: 165 lb (74.8 kg) IBW/kg (Calculated) : 75.3  Temp (24hrs), Avg:98.8 F (37.1 C), Min:97.9 F (36.6 C), Max:99.7 F (37.6 C)   Recent Labs Lab 11/17/16 2012 11/18/16 0046  WBC 25.3*  --   CREATININE 1.48*  --   LATICACIDVEN  --  1.50    Estimated Creatinine Clearance: 47 mL/min (A) (by C-G formula based on SCr of 1.48 mg/dL (H)).    Allergies  Allergen Reactions  . Demerol [Meperidine] Other (See Comments)    UNSPECIFIED REACTION  Causes system to shutdown. ?   . Oxycodone Hcl Other (See Comments)    Pt states this medication 'wires him up' and makes pt hyper; pt does not want to take this ever again    Narda Bonds 11/18/2016 4:18 AM

## 2016-11-18 NOTE — H&P (Signed)
TRH H&P   Patient Demographics:    [name], is a 71 y.o. male  MRN: [id number]   DOB - 1945-05-20  Admit Date - 11/17/2016  Outpatient Primary MD for the patient is Nche, Charlene Brooke, NP  Referring MD/NP/PA: Wyona Almas  Outpatient Specialists: Tharon Aquas [facility],    Patient coming from: home  Chief Complaint  Patient presents with  . Leg Swelling      HPI:    [name]  is a 71 y.o. male, w CHF, Pafib,  w c/o lower ext swelling and right leg cramping.  Pt uncertain about weight gain.  Pt notes that his lasix was increased recently but didn't help his leg swelling. Pt presented for evaluation   In ED, pt noted to have severe protein calorie malnutrition w albumin 1.7, and elevation in alkaline phosphatase at 241.  Na=123,  Bun/creat 40/1.48.  Wbc 25.3   CXR 7/9 =>  IMPRESSION: Multifocal consolidation with increasing interstitial prominence concerning for bronchopneumonia in a background of COPD. Enlarging small to moderate small LEFT pleural effusions. Followup PA and lateral chest X-ray is recommended in 3-4 weeks following trial of antibiotic therapy to ensure resolution and exclude underlying malignancy.  Pt will be admitted for Hcap.        Review of systems:    In addition to the HPI above,  No Fever-chills, No Headache, No changes with Vision or hearing, No problems swallowing food or Liquids, No Chest pain, Cough or Shortness of Breath, No Abdominal pain, No Nausea or Vommitting, Bowel movements are regular, No Blood in stool or Urine, No dysuria, No new skin rashes or bruises, No new joints pains-aches,  No new weakness, tingling, numbness in any extremity, No recent weight gain or loss, No polyuria, polydypsia or polyphagia, No significant Mental Stressors.  A full 10 point Review of Systems was done, except as stated above, all  other Review of Systems were negative.   With Past History of the following :    Past Medical History:  Diagnosis Date  . Anxiety   . Arthritis    "neck" (08/15/2015)  . Chronic bronchitis ([place])   . Chronic diastolic CHF (congestive heart failure) ([place])   . Constipation   . COPD (chronic obstructive pulmonary disease) ([place])   . DDD (degenerative disc disease), cervical   . Depression   . Dilated aortic root ([place])    61m on echo 05/2016  . Dysrhythmia    A-Fib  cardioverted on 10-11-2016  . Edema of left lower extremity   . Facial basal cell cancer   . H/O acne vulgaris 1960s   [facility]  . Headache    history of - left temporal- years ago- not current (08/15/2015)  . Persistent atrial fibrillation (HCC)    on coumadin with CHADS2VASC score of 2  .  Pneumonia 1960s; 2015 X 2  . Prostate cancer ([place]) dx'd early 2000s   "low spreading; non aggressive type" (08/15/2015)  . Pulmonary HTN ([place])    PASP 35mHg by echo 05/2016  . Skin cancer    "back"      Past Surgical History:  Procedure Laterality Date  . CARDIOVERSION N/A 10/11/2016   Procedure: CARDIOVERSION;  Surgeon: CSanda Klein MD;  [facility];  Service: Cardiovascular;  Laterality: N/A;  . COLONOSCOPY    . EXCISIONAL HEMORRHOIDECTOMY  1960s  . INGUINAL HERNIA REPAIR Right 2002  . INGUINAL HERNIA REPAIR Bilateral 08/15/2015  . INGUINAL HERNIA REPAIR Bilateral 08/15/2015   Procedure: OPEN REPAIR RECURRENT RIGHT INGUINAL HERNIA WITH MESH AND REPAIR LEFT INGUINAL HERNIA WITH MESH;  Surgeon: HFanny Skates MD;  [place]  Service: General;  Laterality: Bilateral;  . INSERTION OF MESH Bilateral 08/15/2015   Procedure: INSERTION OF MESH;  Surgeon: HFanny Skates MD;  [place]  Service: General;  Laterality: Bilateral;  . LYMPH NODE BIOPSY Bilateral 10/22/2016   Procedure: LEFT NECK LYMPH NODE BIOPSY;  Surgeon: VIvin Poot MD;  [place]  Service:  Thoracic;  Laterality: Bilateral;  . PROSTATE BIOPSY    . TONSILLECTOMY  1950s  . VIDEO ASSISTED THORACOSCOPY (VATS)/EMPYEMA Right 05/20/2016   Procedure: VIDEO ASSISTED THORACOSCOPY (VATS) with drainage of pleural effusion;  Surgeon: PIvin Poot MD;  [place]  Service: Thoracic;  Laterality: Right;  .Marland KitchenVIDEO BRONCHOSCOPY WITH ENDOBRONCHIAL ULTRASOUND Right 05/20/2016   Procedure: VIDEO BRONCHOSCOPY WITH ENDOBRONCHIAL ULTRASOUND;  Surgeon: PIvin Poot MD;  [place];  Service: Thoracic;  Laterality: Right;      Social History:     Social History  Substance Use Topics  . Smoking status: Former Smoker    Packs/day: 1.00    Years: 62.00    Types: Cigarettes    Quit date: 05/14/2016  . Smokeless tobacco: Never Used  . Alcohol use No     Lives - at home  Mobility -      Family History :     Family History  Problem Relation Age of Onset  . Cancer Mother        colon cancer  . Alcoholism Father   . CAD Father   . Alcohol abuse Father   . Ovarian cancer Sister   . Diabetes Neg Hx       Home Medications:   Prior to Admission medications   Medication Sig Start Date End Date Taking? Authorizing Provider  Ascorbic Acid (VITAMIN C) 1000 MG tablet Take 1,000 mg by mouth 2 (two) times daily.    [provider]  furosemide (LASIX) 40 MG tablet Take 40 mg by mouth daily.     [provider]  Probiotic Product (SOLUBLE FIBER/PROBIOTICS PO) Take 2 capsules by mouth 3 (three) times a week.     [provider]  rivaroxaban (XARELTO) 20 MG TABS tablet Take 1 tablet (20 mg total) by mouth daily with supper. 10/23/16   TSueanne Margarita MD  spironolactone (ALDACTONE) 25 MG tablet Take 0.5 tablets (12.5 mg total) by mouth daily. 11/15/16 02/13/17  KErlene Quan PA-C  traMADol (ULTRAM) 50 MG tablet Take 1 tablet (50 mg total) by mouth at bedtime as needed for moderate pain. 11/07/16   VIvin Poot MD     Allergies:     Allergies    Allergen Reactions  . Demerol [Meperidine] Other (See Comments)    UNSPECIFIED  REACTION  Causes system to shutdown. ?   . Oxycodone Hcl Other (See Comments)    Pt states this medication 'wires him up' and makes pt hyper; pt does not want to take this ever again     Physical Exam:   Vitals  Blood pressure (!) 96/59, pulse 91, temperature 99.7 F (37.6 C), temperature source Rectal, resp. rate 16, height _0  (1.803 m), weight 74.8 kg (165 lb), SpO2 95 %.   1. General  lying in bed in NAD,    2. Normal affect and insight, Not Suicidal or Homicidal, Awake Alert, Oriented X 3.  3. No F.N deficits, ALL C.Nerves Intact, Strength 5/5 all 4 extremities, Sensation intact all 4 extremities, Plantars down going.  4. Ears and Eyes appear Normal, Conjunctivae clear, PERRLA. Moist Oral Mucosa.  5. Supple Neck, No JVD, No cervical lymphadenopathy appriciated, No Carotid Bruits.  6. Symmetrical Chest wall movement, Good air movement bilaterally, slight decrease in bs right lung base, crackles left lung base  7. RRR, No Gallops, Rubs or Murmurs, No Parasternal Heave.  8. Positive Bowel Sounds, Abdomen Soft, No tenderness, No organomegaly appriciated,No rebound -guarding or rigidity.  9.  No Cyanosis, Normal Skin Turgor, No Skin Rash or Bruise.  10. Good muscle tone,  joints appear normal , no effusions, Normal ROM.  11. No Palpable Lymph Nodes in Neck or Axillae    Data Review:    CBC  Recent Labs Lab 11/17/16 2012 11/18/16 0040  WBC 25.3*  --   HGB 12.7*  --   HCT 38.8*  --   PLT 409*  --   MCV 90.7  --   MCH 29.7  --   MCHC 32.7  --   RDW 14.8  --   LYMPHSABS  --  1.4  MONOABS  --  0.5  EOSABS  --  0.0  BASOSABS  --  0.0   ------------------------------------------------------------------------------------------------------------------  Chemistries   Recent Labs Lab 11/17/16 2012 11/18/16 0040  NA 123*  --   K 4.8  --   CL 89*  --   CO2 24  --   GLUCOSE  108*  --   BUN 40*  --   CREATININE 1.48*  --   CALCIUM 8.3*  --   AST  --  35  ALT  --  21  ALKPHOS  --  241*  BILITOT  --  1.1   ------------------------------------------------------------------------------------------------------------------ estimated creatinine clearance is 47 mL/min (A) (by C-G formula based on SCr of 1.48 mg/dL (H)). ------------------------------------------------------------------------------------------------------------------ No results for input(s): TSH, T4TOTAL, T3FREE, THYROIDAB in the last 72 hours.  Invalid input(s): FREET3  Coagulation profile No results for input(s): INR, PROTIME in the last 168 hours. ------------------------------------------------------------------------------------------------------------------- No results for input(s): DDIMER in the last 72 hours. -------------------------------------------------------------------------------------------------------------------  Cardiac Enzymes No results for input(s): CKMB, TROPONINI, MYOGLOBIN in the last 168 hours.  Invalid input(s): CK ------------------------------------------------------------------------------------------------------------------    Component Value Date/Time   BNP 640.7 (H) 11/18/2016 0036     ---------------------------------------------------------------------------------------------------------------  Urinalysis    Component Value Date/Time   COLORURINE STRAW (A) 06/08/2016 1932   APPEARANCEUR CLEAR 06/08/2016 1932   LABSPEC 1.006 06/08/2016 1932   PHURINE 6.0 06/08/2016 1932   GLUCOSEU NEGATIVE 06/08/2016 1932   HGBUR NEGATIVE 06/08/2016 1932   BILIRUBINUR NEGATIVE 06/08/2016 1932   KETONESUR NEGATIVE 06/08/2016 1932   PROTEINUR 100 (A) 06/08/2016 1932   NITRITE NEGATIVE 06/08/2016 1932   LEUKOCYTESUR NEGATIVE 06/08/2016 1932    ----------------------------------------------------------------------------------------------------------------    Imaging Results:  Dg Chest Port 1 View  Result Date: 11/18/2016 CLINICAL DATA:  Leukocytosis and leg swelling. History of COPD, chronic bronchitis and prostate cancer. EXAM: PORTABLE CHEST 1 VIEW COMPARISON:  PET-CT October 31, 2016 and chest radiograph October 22, 2016 FINDINGS: Enlarging small to moderate RIGHT and small LEFT pleural effusions. Patchy consolidation projecting RIGHT upper lung zone in bilateral lower lung zone with diffuse interstitial prominence, increased from prior examination. Hyperinflation. Cardiac silhouette is mildly enlarged, calcified aortic knob. a increasing fullness of the LEFT aortopulmonary window corresponding to lymphadenopathy on prior CT chest. RIGHT upper lobe calcified granuloma. No pneumothorax. Broad dextroscoliosis with calcified mediastinal lymph nodes. IMPRESSION: Multifocal consolidation with increasing interstitial prominence concerning for bronchopneumonia in a background of COPD. Enlarging small to moderate small LEFT pleural effusions. Followup PA and lateral chest X-ray is recommended in 3-4 weeks following trial of antibiotic therapy to ensure resolution and exclude underlying malignancy. Mild cardiomegaly. Electronically Signed   By: Elon Alas M.D.   On: 11/18/2016 01:46      Assessment & Plan:    Active Problems:   COPD (chronic obstructive pulmonary disease) (HCC)   Acute respiratory failure with hypoxia (HCC)   Chronic diastolic CHF (congestive heart failure) (HCC)   Hyponatremia    Hcap Blood culture x2 Respiratory panel Urine strep antigen Urine legionella antigen Vanco/Cefepime iv pharmacy to dose  Leukocytosis Secondary to Hcap Repeat cbc in am  Lower ext edema likely secondary to severe protein calorie malnutrition Check tsh Check ua   Check  Cardiac echo Check LE ultrasound r/o DVT  Hyponatremia Check serum osm, tsh, cortisol, urine sodium, urine osm Hydrate w ns iv  CHF (EF 65-70%) Cont Lasix  Mild renal  insufficiency Check cmp in am      DVT Prophylaxis Heparin  - SCDs   AM Labs Ordered, also please review Full Orders  Family Communication: Admission, patients condition and plan of care including tests being ordered have been discussed with the patient  who indicate understanding and agree with the plan and Code Status.  Code Status FULL CODE  Likely DC to  home  Condition GUARDED    Consults called:    Admission status: inpatient  Time spent in minutes : 45 minutes   Jani Gravel M.D on 11/18/2016 at 2:06 AM  Between 7am to 7pm - Pager - [phone] After 7pm go to [email or web address] - password [facility]  [facility] - Office  [phone]

## 2016-11-18 NOTE — Progress Notes (Signed)
Patient was admitted early this AM after midnight and H and P has been reviewed and I am in agreement with the Assessment and Plan done by Dr. Jani Gravel and additional changes to the plan of care have been made. The patient is a 71 yo Caucasian male with a PMH of Diastolic CHF (last echo revealed an EF of 65-70% 05/14/16), COPD, Atrial fibrillation on Xarelto, Prostate cancer with possible mets to lymph nodes, previous empyema status post right VATS in Jan 2018 and other comorbids who presented to the Emergency Department complaining of sudden-onset, constant bilateral leg swelling (worse on the right) that began several days ago and progressively worsening.. Patient reports a history of congestive heart failure, he is on 40 mg of lasix daily, which he has been compliant with. States they called his cardiologist who increased his Lasix to 60 mg daily 2 days ago. Despite this his legs have been continuing to swell. He was admitted for his Lower extremity swelling and HCAP as CXR done in the ED showed Multifocal consolidation with increasing interstitial prominence concerning for bronchopneumonia in a background of COPD. Enlarging small to moderate small LEFT pleural effusions. Because of this CT of the Chest was done which showed Increase in the size of the pleural effusion compared to the prior study with associated partial compressive atelectasis of the lower lobes. Pneumonia is not excluded. Interventional Radiology was consulted for Thoracentesis because of Pleural Effusions and patient was started on Xopenex/Ipratropium, Flutter Valve, and Incentive Spirometery. Dietician was also consulted for this patient because of his poor nutritional status. Patient is being worked up for his Lower Extremity swelling and AKI has improved with IVF. Patient's po Lasix was resumed this AM and Patient remains on IVF still but will stop in AM if patient remains hemodynamically stable. Will Continue Broad Spectrum Abx and continue  to monitor patient closely and monitor his response to clinical interventions. Will repeat Blood work in AM and consider a Cardiology/Pulmonary Consultation based on ECHO and clinical status.

## 2016-11-18 NOTE — Progress Notes (Signed)
Pt BP's are soft. 80's/50's and 60's. Informed MD. Will continue to monitor.

## 2016-11-18 NOTE — ED Notes (Signed)
Admitting Dr. Tamala Ser aware of output deficient.

## 2016-11-18 NOTE — Progress Notes (Signed)
Attempted to bring pt to IR for thoracentesis at 11:00 am and again at 1330, unable to bring due to treatments/meds etc.  Now waiting for readiness from [facility] where pt has just been admitted.

## 2016-11-18 NOTE — Progress Notes (Signed)
PHARMACY - PHYSICIAN COMMUNICATION CRITICAL VALUE ALERT - BLOOD CULTURE IDENTIFICATION (BCID)  Results for orders placed or performed during the hospital encounter of 11/17/16  Blood Culture ID Panel (Reflexed) (Collected: 11/18/2016 12:37 AM)  Result Value Ref Range   Enterococcus species NOT DETECTED NOT DETECTED   Listeria monocytogenes NOT DETECTED NOT DETECTED   Staphylococcus species NOT DETECTED NOT DETECTED   Staphylococcus aureus NOT DETECTED NOT DETECTED   Streptococcus species DETECTED (A) NOT DETECTED   Streptococcus agalactiae NOT DETECTED NOT DETECTED   Streptococcus pneumoniae DETECTED (A) NOT DETECTED   Streptococcus pyogenes NOT DETECTED NOT DETECTED   Acinetobacter baumannii NOT DETECTED NOT DETECTED   Enterobacteriaceae species NOT DETECTED NOT DETECTED   Enterobacter cloacae complex NOT DETECTED NOT DETECTED   Escherichia coli NOT DETECTED NOT DETECTED   Klebsiella oxytoca NOT DETECTED NOT DETECTED   Klebsiella pneumoniae NOT DETECTED NOT DETECTED   Proteus species NOT DETECTED NOT DETECTED   Serratia marcescens NOT DETECTED NOT DETECTED   Haemophilus influenzae NOT DETECTED NOT DETECTED   Neisseria meningitidis NOT DETECTED NOT DETECTED   Pseudomonas aeruginosa NOT DETECTED NOT DETECTED   Candida albicans NOT DETECTED NOT DETECTED   Candida glabrata NOT DETECTED NOT DETECTED   Candida krusei NOT DETECTED NOT DETECTED   Candida parapsilosis NOT DETECTED NOT DETECTED   Candida tropicalis NOT DETECTED NOT DETECTED    Name of physician (or Provider) Contacted: Text paged Dr. Claybon Jabs  Changes to prescribed antibiotics required: likely strep pneumoniae bacteremia, currently coverage ok with vancomycin and cefepime. Could consider changing cefepime to rocephin 2g Q 12 hrs if meningitis cannot be ruled out.    Maryanna Shape, PharmD, BCPS  Clinical Pharmacist  Pager: [phone]  11/18/2016  4:54 PM

## 2016-11-18 NOTE — ED Notes (Signed)
Pt reports he started to have cramps in his legs and has reduced urine production. Pt reports his legs are also swollen and worse then they were on Friday when they called the pt's PCP.

## 2016-11-18 NOTE — ED Provider Notes (Signed)
TIME SEEN: 12:21 AM   By signing my name below, I, [name], attest that this documentation has been prepared under the direction and in the presence of [name], [name], DO. Electronically Signed: [name], Scribe. 11/18/16. 12:31 AM.  CHIEF COMPLAINT: Leg swelling   HPI:  [name] is a 71 y.o. male with a history of CHF (last echo revealed an EF of 65-70% 05/14/16), COPD, atrial fibrillation on Xarelto, prostate cancer with possible mets to lymph nodes, previous empyema status post right VATS in Jan 2018 who presents to the Emergency Department complaining of sudden-onset, constant bilateral leg swelling (worse on the right) that began several days ago and progressively worsening.. According to wife bedside the patient developed a cramp in his right leg, he has had pain ever since. No previous history of DVT. Most of the pain is in his right anterior thigh. Patient reports a history of congestive heart failure, he is on 40 mg of lasix daily, which he has been compliant with. States they called his cardiologist who increased his Lasix to 60 mg daily 2 days ago. Despite this his legs have been continuing to swell.  Patient does wear compression socks daily. Patient denies any chest pain or shortness of breath. Patient does state he has been coughing up phlegm. Patient reports taking tramadol with no relief in pain. Patient denies any recent travel, tick bites, steroid use, or sick contacts. Patient denies fever, chills, nausea, or vomiting, diarrhea. He reports he is able to urinate but is not urinating as much.  ROS: See HPI Constitutional: no fever  Eyes: no drainage  ENT: no runny nose   Cardiovascular:  no chest pain  Resp: no SOB  GI: no vomiting GU: no dysuria Integumentary: no rash  Allergy: no hives  Musculoskeletal: + leg swelling and pain Neurological: no slurred speech ROS otherwise negative  PAST MEDICAL HISTORY/PAST SURGICAL HISTORY:  Past Medical History:   Diagnosis Date  . Anxiety   . Arthritis    "neck" (08/15/2015)  . Chronic bronchitis ([place])   . Chronic diastolic CHF (congestive heart failure) ([place])   . Constipation   . COPD (chronic obstructive pulmonary disease) ([place])   . DDD (degenerative disc disease), cervical   . Depression   . Dilated aortic root ([place])    70m on echo 05/2016  . Dysrhythmia    A-Fib  cardioverted on 10-11-2016  . Edema of left lower extremity   . Facial basal cell cancer   . H/O acne vulgaris 1960s   [facility]  . Headache    history of - left temporal- years ago- not current (08/15/2015)  . Persistent atrial fibrillation ([facility])    on coumadin with CHADS2VASC score of 2  . Pneumonia 1960s; 2015 X 2  . [place]   "low spreading; non aggressive type" (08/15/2015)  . Pulmonary HTN ([place]    PASP 459mg by echo 05/2016  . Skin cancer    "back"    MEDICATIONS:  Prior to Admission medications   Medication Sig Start Date End Date Taking? Authorizing Provider  Ascorbic Acid (VITAMIN C) 1000 MG tablet Take 1,000 mg by mouth 2 (two) times daily.    [provider]  furosemide (LASIX) 40 MG tablet Take 40 mg by mouth daily.     [provider]  Probiotic Product (SOLUBLE FIBER/PROBIOTICS PO) Take 2 capsules by mouth 3 (three) times a week.  [provider]  rivaroxaban (XARELTO) 20 MG TABS tablet Take 1 tablet (20 mg total) by mouth daily with supper. 10/23/16   [name], MD  spironolactone (ALDACTONE) 25 MG tablet Take 0.5 tablets (12.5 mg total) by mouth daily. 11/15/16 02/13/17  [name], PA-C  traMADol (ULTRAM) 50 MG tablet Take 1 tablet (50 mg total) by mouth at bedtime as needed for moderate pain. 11/07/16   [name], MD    ALLERGIES:  Allergies  Allergen Reactions  . Demerol [Meperidine] Other (See Comments)    UNSPECIFIED REACTION  Causes system to shutdown. ?   . Oxycodone Hcl Other (See  Comments)    Pt states this medication 'wires him up' and makes pt hyper; pt does not want to take this ever again    SOCIAL HISTORY:  Social History  Substance Use Topics  . Smoking status: Former Smoker    Packs/day: 1.00    Years: 62.00    Types: Cigarettes    Quit date: 05/14/2016  . Smokeless tobacco: Never Used  . Alcohol use No    FAMILY HISTORY: Family History  Problem Relation Age of Onset  . Cancer Mother        colon cancer  . Alcoholism Father   . CAD Father   . Alcohol abuse Father   . Ovarian cancer Sister   . Diabetes Neg Hx     EXAM: BP 100/67 (BP Location: Left Arm)   Pulse 77   Temp 97.9 F (36.6 C) (Oral)   Resp 20   Ht _0  (1.803 m)   Wt 165 lb (74.8 kg)   SpO2 94%   BMI 23.01 kg/m  CONSTITUTIONAL: Alert and oriented and responds appropriately to questions. Elderly, chronically ill-appearing, skin is very warm to touch HEAD: Normocephalic EYES: Conjunctivae clear, pupils appear equal, EOMI ENT: normal nose; moist mucous membranes NECK: Supple, no meningismus, no nuchal rigidity, no LAD  CARD: Irregularly irregular and tachycardic; S1 and S2 appreciated; no murmurs, no clicks, no rubs, no gallops RESP: Normal chest excursion without splinting or tachypnea; crackles at the bilateral lung bases; no wheezes, no rhonchi, no hypoxia or respiratory distress, speaking full sentences ABD/GI: Normal bowel sounds; non-distended; soft, non-tender, no rebound, no guarding, no peritoneal signs, no hepatosplenomegaly BACK:  The back appears normal and is non-tender to palpation, there is no CVA tenderness EXT: Normal ROM in all joints; non-tender to palpation; 2+ pitting edema in the bilateral lower extremities from the knees down; normal capillary refill; no cyanosis, no calf tenderness or swelling    SKIN: Normal color for age and race; warm; no rash NEURO: Moves all extremities equally, reports normal sensation diffusely, cranial nerves II through XII  intact, normal speech PSYCH: The patient's mood and manner are appropriate. Grooming and personal hygiene are appropriate.   MEDICAL DECISION MAKING: Patient here with increasing pain and swelling in his legs, worse on the right side. Swelling seems symmetric but he reports pain is more on the right side. His legs are warm and well-perfused without signs of gout, cellulitis, septic arthritis or any history of injury. Concern for possible DVT but unable to obtain venous Doppler at this time. He is currently on Xarelto for his atrial fibrillation.   Patient is found to be tachycardic here and hypotensive. Oral temperature normal but he is very warm to touch. Will obtain rectal temperature. Labs ordered in triage show leukocytosis of 25,000. I'm concerned for infection. He does have bibasilar crackles  and has had a cough but no chest pain or shortness of breath. Will obtain cultures, chest x-ray, urine. Will give IV fluids and broad-spectrum antibiotics.  ED PROGRESS: Patient's labs also show hyponatremia. He is oriented, neurologically intact. He is receiving IV hydration. He also has mild renal insufficiency. Chest x-ray shows multifocal pneumonia. He has received vancomycin and Zosyn and a bolus of 2250 mL of IV fluids and his blood pressure is improving. Will discuss with medicine for admission. His PCP is with [facility].   2:29 AM Discussed patient's case with hospitalist, Dr. Maudie Mercury.  I have recommended admission and patient (and family if present) agree with this plan. Admitting physician will place admission orders.   I reviewed all nursing notes, vitals, pertinent previous records, EKGs, lab and urine results, imaging (as available).    EKG Interpretation  Date/Time:  Monday November 18 2016 00:24:21 EDT Ventricular Rate:  87 PR Interval:    QRS Duration: 110 QT Interval:  365 QTC Calculation: 440 R Axis:   96 Text Interpretation:  Atrial fibrillation Probable RVH w/ secondary repol  abnormality Nonspecific T abnormalities, lateral leads No significant change since last tracing in Jan 2018 Confirmed by Pryor Curia [phone]) on 11/18/2016 1:32:33 AM       CRITICAL CARE Performed by: Nyra Jabs   Total critical care time: 45 minutes  Critical care time was exclusive of separately billable procedures and treating other patients.  Critical care was necessary to treat or prevent imminent or life-threatening deterioration.  Critical care was time spent personally by me on the following activities: development of treatment plan with patient and/or surrogate as well as nursing, discussions with consultants, evaluation of patient's response to treatment, examination of patient, obtaining history from patient or surrogate, ordering and performing treatments and interventions, ordering and review of laboratory studies, ordering and review of radiographic studies, pulse oximetry and re-evaluation of patient's condition.   I personally performed the services described in this documentation, which was scribed in my presence. The recorded information has been reviewed and is accurate.     [name], [name], DO 11/18/16 [phone]

## 2016-11-18 NOTE — ED Notes (Signed)
Attempted report

## 2016-11-18 NOTE — Procedures (Signed)
R thoracentesis 20 cc EBL 0 Comp 0

## 2016-11-18 NOTE — Progress Notes (Signed)
Patient was admitted early this AM after midnight and H and P has been reviewed and I am in agreement with the Assessment and Plan done by Dr. Jani Gravel and additional changes to the plan of care have been made. The patient is a 71 yo Caucasian male with a PMH of Diastolic CHF (last echo revealed an EF of 65-70% 05/14/16), COPD, Atrial fibrillation on Xarelto, Prostate cancer with possible mets to lymph nodes, previous empyema status post right VATS in Jan 2018 and other comorbids who presented to the Emergency Department complaining of sudden-onset, constant bilateral leg swelling (worse on the right) that began several days ago and progressively worsening.. Patient reports a history of congestive heart failure, he is on 40 mg of lasix daily, which he has been compliant with. States they called his cardiologist who increased his Lasix to 60 mg daily 2 days ago. Despite this his legs have been continuing to swell. He was admitted for his Lower extremity swelling and HCAP (Strep Urine Ag was Positive) and as CXR done in the ED showed Multifocal consolidation with increasing interstitial prominence concerning for bronchopneumonia in a background of COPD. Enlarging small to moderate small LEFT pleural effusions. Because of this CT of the Chest was done which showed Increase in the size of the pleural effusion compared to the prior study with associated partial compressive atelectasis of the lower lobes. Pneumonia is not excluded. Interventional Radiology was consulted for Thoracentesis because of Pleural Effusions and patient was started on Xopenex/Ipratropium, Flutter Valve, and Incentive Spirometery. Dietician was also consulted for this patient because of his poor nutritional status. Patient is being worked up for his Lower Extremity swelling and AKI has improved with IVF. Patient's po Lasix was resumed this AM and Patient remains on IVF still but will stop in AM if patient remains hemodynamically stable. Will  Continue Broad Spectrum Abx and continue to monitor patient closely and monitor his response to clinical interventions. Will repeat Blood work in AM and consider a Cardiology/Pulmonary Consultation based on ECHO and clinical status.

## 2016-11-18 NOTE — ED Notes (Signed)
Assisted patient with use of urinal

## 2016-11-18 NOTE — ED Notes (Signed)
Admitting physician at bedside

## 2016-11-19 ENCOUNTER — Inpatient Hospital Stay (HOSPITAL_COMMUNITY): Payer: Medicare Other

## 2016-11-19 ENCOUNTER — Encounter (HOSPITAL_COMMUNITY): Payer: Medicare Other

## 2016-11-19 DIAGNOSIS — J90 Pleural effusion, not elsewhere classified: Secondary | ICD-10-CM

## 2016-11-19 DIAGNOSIS — I890 Lymphedema, not elsewhere classified: Secondary | ICD-10-CM

## 2016-11-19 DIAGNOSIS — I272 Pulmonary hypertension, unspecified: Secondary | ICD-10-CM

## 2016-11-19 DIAGNOSIS — I481 Persistent atrial fibrillation: Secondary | ICD-10-CM

## 2016-11-19 DIAGNOSIS — E43 Unspecified severe protein-calorie malnutrition: Secondary | ICD-10-CM

## 2016-11-19 DIAGNOSIS — I361 Nonrheumatic tricuspid (valve) insufficiency: Secondary | ICD-10-CM

## 2016-11-19 DIAGNOSIS — Z8249 Family history of ischemic heart disease and other diseases of the circulatory system: Secondary | ICD-10-CM

## 2016-11-19 DIAGNOSIS — M79604 Pain in right leg: Secondary | ICD-10-CM

## 2016-11-19 DIAGNOSIS — J449 Chronic obstructive pulmonary disease, unspecified: Secondary | ICD-10-CM

## 2016-11-19 DIAGNOSIS — Z8701 Personal history of pneumonia (recurrent): Secondary | ICD-10-CM

## 2016-11-19 DIAGNOSIS — J13 Pneumonia due to Streptococcus pneumoniae: Secondary | ICD-10-CM

## 2016-11-19 DIAGNOSIS — E46 Unspecified protein-calorie malnutrition: Secondary | ICD-10-CM

## 2016-11-19 DIAGNOSIS — Z9889 Other specified postprocedural states: Secondary | ICD-10-CM

## 2016-11-19 DIAGNOSIS — R7881 Bacteremia: Secondary | ICD-10-CM

## 2016-11-19 DIAGNOSIS — F17211 Nicotine dependence, cigarettes, in remission: Secondary | ICD-10-CM

## 2016-11-19 DIAGNOSIS — B953 Streptococcus pneumoniae as the cause of diseases classified elsewhere: Secondary | ICD-10-CM | POA: Diagnosis present

## 2016-11-19 DIAGNOSIS — R601 Generalized edema: Secondary | ICD-10-CM

## 2016-11-19 DIAGNOSIS — Z8041 Family history of malignant neoplasm of ovary: Secondary | ICD-10-CM

## 2016-11-19 DIAGNOSIS — M79661 Pain in right lower leg: Secondary | ICD-10-CM

## 2016-11-19 DIAGNOSIS — I480 Paroxysmal atrial fibrillation: Secondary | ICD-10-CM

## 2016-11-19 DIAGNOSIS — Z8709 Personal history of other diseases of the respiratory system: Secondary | ICD-10-CM

## 2016-11-19 DIAGNOSIS — R52 Pain, unspecified: Secondary | ICD-10-CM

## 2016-11-19 DIAGNOSIS — Z811 Family history of alcohol abuse and dependence: Secondary | ICD-10-CM

## 2016-11-19 DIAGNOSIS — Z800 Family history of malignant neoplasm of digestive organs: Secondary | ICD-10-CM

## 2016-11-19 DIAGNOSIS — C61 Malignant neoplasm of prostate: Secondary | ICD-10-CM

## 2016-11-19 DIAGNOSIS — Z885 Allergy status to narcotic agent status: Secondary | ICD-10-CM

## 2016-11-19 DIAGNOSIS — Z8546 Personal history of malignant neoplasm of prostate: Secondary | ICD-10-CM

## 2016-11-19 DIAGNOSIS — A403 Sepsis due to Streptococcus pneumoniae: Principal | ICD-10-CM

## 2016-11-19 DIAGNOSIS — R338 Other retention of urine: Secondary | ICD-10-CM

## 2016-11-19 DIAGNOSIS — J869 Pyothorax without fistula: Secondary | ICD-10-CM

## 2016-11-19 DIAGNOSIS — E8809 Other disorders of plasma-protein metabolism, not elsewhere classified: Secondary | ICD-10-CM

## 2016-11-19 DIAGNOSIS — A419 Sepsis, unspecified organism: Secondary | ICD-10-CM

## 2016-11-19 DIAGNOSIS — N179 Acute kidney failure, unspecified: Secondary | ICD-10-CM

## 2016-11-19 LAB — COMPREHENSIVE METABOLIC PANEL
ALT: 16 U/L — AB (ref 17–63)
AST: 26 U/L (ref 15–41)
Albumin: 1.4 g/dL — ABNORMAL LOW (ref 3.5–5.0)
Alkaline Phosphatase: 181 U/L — ABNORMAL HIGH (ref 38–126)
Anion gap: 10 (ref 5–15)
BUN: 41 mg/dL — ABNORMAL HIGH (ref 6–20)
CALCIUM: 8 mg/dL — AB (ref 8.9–10.3)
CHLORIDE: 99 mmol/L — AB (ref 101–111)
CO2: 19 mmol/L — ABNORMAL LOW (ref 22–32)
CREATININE: 1.15 mg/dL (ref 0.61–1.24)
Glucose, Bld: 104 mg/dL — ABNORMAL HIGH (ref 65–99)
Potassium: 4.5 mmol/L (ref 3.5–5.1)
Sodium: 128 mmol/L — ABNORMAL LOW (ref 135–145)
Total Bilirubin: 0.8 mg/dL (ref 0.3–1.2)
Total Protein: 5.3 g/dL — ABNORMAL LOW (ref 6.5–8.1)

## 2016-11-19 LAB — CBC WITH DIFFERENTIAL/PLATELET
BASOS PCT: 0 %
Basophils Absolute: 0 10*3/uL (ref 0.0–0.1)
EOS ABS: 0 10*3/uL (ref 0.0–0.7)
Eosinophils Relative: 0 %
HCT: 35 % — ABNORMAL LOW (ref 39.0–52.0)
Hemoglobin: 11.6 g/dL — ABNORMAL LOW (ref 13.0–17.0)
LYMPHS ABS: 1.1 10*3/uL (ref 0.7–4.0)
Lymphocytes Relative: 4 %
MCH: 30.4 pg (ref 26.0–34.0)
MCHC: 33.1 g/dL (ref 30.0–36.0)
MCV: 91.6 fL (ref 78.0–100.0)
MONO ABS: 1.1 10*3/uL — AB (ref 0.1–1.0)
Monocytes Relative: 4 %
NEUTROS ABS: 25.7 10*3/uL — AB (ref 1.7–7.7)
Neutrophils Relative %: 92 %
PLATELETS: 336 10*3/uL (ref 150–400)
RBC: 3.82 MIL/uL — ABNORMAL LOW (ref 4.22–5.81)
RDW: 15.1 % (ref 11.5–15.5)
WBC: 27.9 10*3/uL — ABNORMAL HIGH (ref 4.0–10.5)

## 2016-11-19 LAB — PROCALCITONIN: PROCALCITONIN: 3.02 ng/mL

## 2016-11-19 LAB — MAGNESIUM: MAGNESIUM: 1.7 mg/dL (ref 1.7–2.4)

## 2016-11-19 LAB — PATHOLOGIST SMEAR REVIEW

## 2016-11-19 LAB — URINE CULTURE: CULTURE: NO GROWTH

## 2016-11-19 LAB — GLUCOSE, CAPILLARY
GLUCOSE-CAPILLARY: 120 mg/dL — AB (ref 65–99)
Glucose-Capillary: 99 mg/dL (ref 65–99)
Glucose-Capillary: 145 mg/dL — ABNORMAL HIGH (ref 65–99)
Glucose-Capillary: 89 mg/dL (ref 65–99)

## 2016-11-19 LAB — LEGIONELLA PNEUMOPHILA SEROGP 1 UR AG: L. pneumophila Serogp 1 Ur Ag: NEGATIVE

## 2016-11-19 LAB — BRAIN NATRIURETIC PEPTIDE: B Natriuretic Peptide: 433.1 pg/mL — ABNORMAL HIGH (ref 0.0–100.0)

## 2016-11-19 LAB — ECHOCARDIOGRAM COMPLETE
HEIGHTINCHES: 71 in
WEIGHTICAEL: 2868.8 [oz_av]

## 2016-11-19 LAB — PH, BODY FLUID: PH, BODY FLUID: 7.8

## 2016-11-19 LAB — PHOSPHORUS: PHOSPHORUS: 3.8 mg/dL (ref 2.5–4.6)

## 2016-11-19 MED ORDER — CEFTRIAXONE SODIUM 2 G IJ SOLR
2.0000 g | INTRAMUSCULAR | Status: DC
  Administered 2016-11-19 – 2016-11-20 (×2): 2 g via INTRAVENOUS
  Filled 2016-11-19 (×3): qty 2

## 2016-11-19 MED ORDER — ACETAMINOPHEN 325 MG PO TABS: 650.0000 mg | ORAL_TABLET | Freq: Four times a day (QID) | ORAL | Status: DC | PRN

## 2016-11-19 NOTE — Progress Notes (Signed)
PROGRESS NOTE    [name]  AYT:[id number] DOB: 11-24-1945 DOA: 11/17/2016 PCP: Flossie Buffy, NP  Brief Narrative:  The patient is a 71 yo Caucasian male with a PMH of Diastolic CHF (last echo revealed an EF of 65-70% 05/14/16), COPD, Atrial fibrillation on Xarelto, Prostate cancer with possible mets to lymph nodes, previous empyema status post right VATS in Jan 2018 and other comorbids who presented to the Emergency Department complaining of sudden-onset, constant bilateral leg swelling (worse on the right) that began several days ago and progressively worsening.. Patient reports a history of congestive heart failure, he is on 40 mg of lasix daily, which he has been compliant with. States they called his cardiologist who increased his Lasix to 60 mg daily 2 days ago. Despite this his legs have been continuing to swell.   He was admitted for his Lower extremity swelling and HCAP (Strep Urine Ag was Positive) and as CXR done in the ED showed Multifocal consolidation with increasing interstitial prominence concerning for bronchopneumonia in a background of COPD. Enlarging small to moderate small LEFT pleural effusions. Because of this CT of the Chest was done which showed Increase in the size of the pleural effusion compared to the prior study with associated partial compressive atelectasis of the lower lobes. Pneumonia is not excluded. Interventional Radiology was consulted for Thoracentesis because of Pleural Effusions and patient was started on Xopenex/Ipratropium, Flutter Valve, and Incentive Spirometery. He was also found to have a Strep Pneumoniae Bacteremia when blood Cultures resulted .   Dietician was also consulted for this patient because of his poor nutritional status. Patient is being worked up for his Lower Extremity swelling and AKI has improved with IVF. IVF have now stopped as well as Lasix. Infectious diseases was consulted for further evaluation and recc's and TCTS was notified  and patient under go pigtail Catheter in AM. Discussed case with Cardiology Dr. Marlou Porch and he recommended not to diurese at this point and wait until patient clinically improves.  Patient continues to complain of Right lower extremity pain so a CT of the leg was ordered as well as A Venous Duplex to r/o DVT. Several Studies are still pending. Because of high risk of decompensation orders were placed to move the patient to SDU.    Assessment & Plan:   Principal Problem:   Bacteremia due to Streptococcus pneumoniae Active Problems:   COPD (chronic obstructive pulmonary disease) (HCC)   Protein-calorie malnutrition, severe (HCC)   Acute respiratory failure with hypoxia (HCC)   Empyema (HCC)   Persistent atrial fibrillation (HCC)   Chronic diastolic CHF (congestive heart failure) ([place])   Pulmonary HTN (HCC)   History of prostate cancer   Hyponatremia   Pneumonia   HCAP (healthcare-associated pneumonia)   Sepsis ([place])   Anasarca   Hypoalbuminemia due to protein-calorie malnutrition (HCC)   Right leg pain   AKI (acute kidney injury) ([place])   Acute urinary retention  Sepsis 2/2 to Strep Pneumoniae HCAP and Bacteremia -Patient was Tachycardic, Tachypenic, Has a bacteremia and Pneumonia.  -Patient received Sepsis Protocol IVF Rehydration in ED -Started on Broad Spectrum Abx with IV Vanc and IV Cefepime -Infectious Diseases Consulted and IV Cefepime changed to IV Ceftriaxone 2 grams q24h -Lactic Acid was 1.77 and Procalcitonin was 3.02 -WBC went from 23.9 -> 27.9  Strep Pneumoniae HCAP and Bacteremia, poA -Blood culture x2 both grew Strep Pneumoniae; Sensitivities still pending -Respiratory Viral Panel was Negative -Urine strep antigen Positive -Urine Legionella Antigen Negative -  Started on Broad Spectrum Abx with IV Vanc and IV Cefepime -Infectious Diseases Consulted and IV Cefepime changed to IV Ceftriaxone 2 grams q24h -C/w Xopenex/Ipratropium, Flutter Valve, and Incentive  Spirometery. -Check ECHO and possible TEE for ? Endocarditis -Per ID Repeat Blood Cx in AM and place PICC once Blood Cx are negative  Persistent Loculated Right Pleural Effusion with Hx of VATS Drainage -Had IR attempt to drain Pleural effusion -TCTS Consulted and recommended IR CT Guided Right Pigtail Chest Tube  -Patient to have procedure done in AM  Anasarca and Massive Lower extremity Edema from Volume overload likley from Diastolic CHF and Hypoalbuminemia  -BNP was 433.1 -? Diastolic and Right Heart Failure Exacerbation in conjunction with poor Nutritional Status and Albumin of 1.4 likely from prostate Cancer -Check ECHOCARDIOGRAM, Consult Nutrition,  -Lower Extremity Bilateral Duplex pending -TSH was 1.662 -Urine showed 100 Protein -Discussed case with Dr. Marlou Porch of Cardiology and recommended avoiding Diuresis for now and continue to treat Bacteremia and HCAP for now given risk of intravascular collapse and then once patient is improved try gentle diuresis   Right Leg Pain and Swelling (presenting complaint) -C/w Acetaminophen 650 mg po q6hprn for Pain and K Pad -Avoiding Narcotics given Relative Hypotension -Obtain Bilateral Lower Extremity Duplex to r/o DVT -CT Femur and Tibia/Fibular ordered and pending to evaluate any pathology  -Added Tramadol 50 mg po qHS prn  Hyponatremia -Suspect Hypervolemic Hyponatremia -Urine Lytes never done -NA+ went from 123 -> 126 -> 128 -Repeat CMP in AM  Prostate Cancer with Mets to Mediastinal Lymph Nodes -Never Been Treated -CT Chest showed Calcified hilar and mediastinal lymph nodes/granuloma. There is fullness of the hilar region which may represent mildly enlarged lymph nodes. Subcarinal mass/adenopathy or conglomerate of large lymph nodes with a combined dimension of approximately 5.5 x 5.7 cm (previously 5.3 x 4.3 cm on CT of 10/02/2016 -Recent PET Scan showed Adenopathy within the chest, abdomen, and pelvis. This is primarily non  hypermetabolic, with low-level hypermetabolism within a dominant subcarinal mass. findings are suspicious for metastatic disease from prostate cancer. non FDG avid neoplasm such as low-grade lymphoma is felt less likel  Suspected Diastolic CHF (EF 14-43%) -Lasix Held given Hypotension -Check ECHOCardiogram  AKI -BUN/Cr went from 40/1.48 -> 39/1.08 -> 41/1.15 -Urine Lytes never done -Repeat CMP in AM   Atrial Fibrillation -C/w Rivaroxaban -Transfer to SDU  Pulmonary HTN -Repeat ECHO  Acute Urinary Retention -? Prostate Cancer -Patient was unable to void and had >500 mL in his bladder -Foley catheter inserted  DVT prophylaxis: Anticoagulated with Rivaroxaban Code Status: FULL CODE Family Communication: Discussed with wife and other family at bedside Disposition Plan: Transfer to SDU as patient is High Risk  Consultants:   Infectious Diseases  TCTS (Spoke with Thurmond Butts and she will discuss case with Dr. Harriet Pho)  Discussed case with Cardiology Dr. Candee Furbish   Procedures: Right Thoracentesis on 11/18/16 yeilding 20 cc; Right Pig Tail catheter tomorrow   Antimicrobials:  Anti-infectives    Start     Dose/Rate Route Frequency Ordered Stop   11/19/16 1600  cefTRIAXone (ROCEPHIN) 2 g in dextrose 5 % 50 mL IVPB     2 g 100 mL/hr over 30 Minutes Intravenous Every 24 hours 11/19/16 1453     11/18/16 1000  vancomycin (VANCOCIN) IVPB 750 mg/150 ml premix     750 mg 150 mL/hr over 60 Minutes Intravenous Every 12 hours 11/18/16 0421     11/18/16 0600  ceFEPIme (MAXIPIME) 1 g in dextrose  5 % 50 mL IVPB  Status:  Discontinued     1 g 100 mL/hr over 30 Minutes Intravenous Every 8 hours 11/18/16 0407 11/19/16 1446   11/18/16 0145  vancomycin (VANCOCIN) IVPB 1000 mg/200 mL premix     1,000 mg 200 mL/hr over 60 Minutes Intravenous  Once 11/18/16 0132 11/18/16 0304   11/18/16 0145  piperacillin-tazobactam (ZOSYN) IVPB 3.375 g     3.375 g 100 mL/hr over 30 Minutes Intravenous  Once  11/18/16 0132 11/18/16 0227     Subjective: Seen and examined and was having extreme Right Leg pain on movement. No nausea or vomiting. A little SOB. Wife Concerned about Loculated Pleural Effusion. Told the patient about bacteremia and he was discouraged.    Objective: Vitals:   11/19/16 0823 11/19/16 1100 11/19/16 1402 11/19/16 1700  BP:      Pulse:      Resp:      Temp:  98.9 F (37.2 C)  97.9 F (36.6 C)  TempSrc:  Oral  Axillary  SpO2: 94%  96%   Weight:      Height:        Intake/Output Summary (Last 24 hours) at 11/19/16 1922 Last data filed at 11/19/16 1000  Gross per 24 hour  Intake              326 ml  Output              525 ml  Net             -199 ml   Filed Weights   11/17/16 2010 11/19/16 0412  Weight: 74.8 kg (165 lb) 81.3 kg (179 lb 4.8 oz)   Examination: Physical Exam:  Constitutional:  NAD and appears calm but uncomfortable Eyes: Lids and conjunctivae normal, sclerae anicteric  ENMT: External Ears, Nose appear normal. Grossly normal hearing. Mucous membranes are moist.  Neck: Appears normal, supple, no cervical masses, normal ROM, no appreciable thyromegaly, mild JVD Respiratory: Diminished to auscultation with some crackles. Slightly increased respiratory effort. No accessory muscle use. Wearing Supplemental O2  Cardiovascular: Irregularly Irregular, no murmurs / rubs / gallops. S1 and S2 auscultated. 2-3+ Lower extremity edema and anasarca. Abdomen: Soft, mildy tender, non-distended. No masses palpated. No appreciable hepatosplenomegaly. Bowel sounds positive x4  GU: Deferred. Foley catheter in place Musculoskeletal: No cyanosis of digits/nails. No joint deformity upper and lower extremities.  Skin: Had mild erythema of back of right leg. No induration; Warm and dry.  Neurologic: CN 2-12 grossly intact with no focal deficits.  Romberg sign cerebellar reflexes not assessed.  Psychiatric: Normal judgment and insight. Alert and oriented x 3. Normal mood  and appropriate affect.   Data Reviewed: I have personally reviewed following labs and imaging studies  CBC:  Recent Labs Lab 11/17/16 2012 11/18/16 0040 11/18/16 0453 11/19/16 0348  WBC 25.3*  --  23.9* 27.9*  NEUTROABS  --  28.2*  --  25.7*  HGB 12.7*  --  11.5* 11.6*  HCT 38.8*  --  35.4* 35.0*  MCV 90.7  --  91.2 91.6  PLT 409*  --  381 694   Basic Metabolic Panel:  Recent Labs Lab 11/17/16 2012 11/18/16 0453 11/19/16 0348  NA 123* 126* 128*  K 4.8 5.5* 4.5  CL 89* 95* 99*  CO2 24 22 19*  GLUCOSE 108* 111* 104*  BUN 40* 39* 41*  CREATININE 1.48* 1.08 1.15  CALCIUM 8.3* 7.7* 8.0*  MG  --   --  1.7  PHOS  --   --  3.8   GFR: Estimated Creatinine Clearance: 60.9 mL/min (by C-G formula based on SCr of 1.15 mg/dL). Liver Function Tests:  Recent Labs Lab 11/18/16 0040 11/18/16 0453 11/19/16 0348  AST 35 41 26  ALT 21 16* 16*  ALKPHOS 241* 214* 181*  BILITOT 1.1 1.8* 0.8  PROT 6.5 5.4* 5.3*  ALBUMIN 1.7* 1.4* 1.4*   No results for input(s): LIPASE, AMYLASE in the last 168 hours. No results for input(s): AMMONIA in the last 168 hours. Coagulation Profile:  Recent Labs Lab 11/18/16 1241 11/18/16 1830  INR 3.61 3.68   Cardiac Enzymes: No results for input(s): CKTOTAL, CKMB, CKMBINDEX, TROPONINI in the last 168 hours. BNP (last 3 results) No results for input(s): PROBNP in the last 8760 hours. HbA1C: No results for input(s): HGBA1C in the last 72 hours. CBG:  Recent Labs Lab 11/18/16 1655 11/19/16 0753 11/19/16 1150 11/19/16 1530  GLUCAP 104* 99 145* 89   Lipid Profile: No results for input(s): CHOL, HDL, LDLCALC, TRIG, CHOLHDL, LDLDIRECT in the last 72 hours. Thyroid Function Tests:  Recent Labs  11/18/16 1241  TSH 1.662   Anemia Panel: No results for input(s): VITAMINB12, FOLATE, FERRITIN, TIBC, IRON, RETICCTPCT in the last 72 hours. Sepsis Labs:  Recent Labs Lab 11/18/16 0046 11/18/16 0506 11/19/16 0909  PROCALCITON  --   --   3.02  LATICACIDVEN 1.50 1.77  --     Recent Results (from the past 240 hour(s))  Blood culture (routine x 2)     Status: Abnormal (Preliminary result)   Collection Time: 11/18/16 12:37 AM  Result Value Ref Range Status   Specimen Description BLOOD RIGHT ANTECUBITAL  Final   Special Requests   Final    BOTTLES DRAWN AEROBIC AND ANAEROBIC Blood Culture adequate volume   Culture  Setup Time   Final    GRAM POSITIVE COCCI IN PAIRS IN CHAINS IN BOTH AEROBIC AND ANAEROBIC BOTTLES CRITICAL RESULT CALLED TO, READ BACK BY AND VERIFIED WITH: Colin Rhein, PHARMD AT 6333 ON 11/18/16 BY C.JESSUP, MLT.    Culture STREPTOCOCCUS PNEUMONIAE (A)  Final   Report Status PENDING  Incomplete  Blood Culture ID Panel (Reflexed)     Status: Abnormal   Collection Time: 11/18/16 12:37 AM  Result Value Ref Range Status   Enterococcus species NOT DETECTED NOT DETECTED Final   Listeria monocytogenes NOT DETECTED NOT DETECTED Final   Staphylococcus species NOT DETECTED NOT DETECTED Final   Staphylococcus aureus NOT DETECTED NOT DETECTED Final   Streptococcus species DETECTED (A) NOT DETECTED Final    Comment: CRITICAL RESULT CALLED TO, READ BACK BY AND VERIFIED WITH: M. BELL, RPHARMD AT 1638 ON 11/18/16 BY C. JESSUP, MLT.    Streptococcus agalactiae NOT DETECTED NOT DETECTED Final   Streptococcus pneumoniae DETECTED (A) NOT DETECTED Final    Comment: CRITICAL RESULT CALLED TO, READ BACK BY AND VERIFIED WITH: M. BELL, RPHARMD AT 1638 ON 11/18/16 BY C. JESSUP, MLT.    Streptococcus pyogenes NOT DETECTED NOT DETECTED Final   Acinetobacter baumannii NOT DETECTED NOT DETECTED Final   Enterobacteriaceae species NOT DETECTED NOT DETECTED Final   Enterobacter cloacae complex NOT DETECTED NOT DETECTED Final   Escherichia coli NOT DETECTED NOT DETECTED Final   Klebsiella oxytoca NOT DETECTED NOT DETECTED Final   Klebsiella pneumoniae NOT DETECTED NOT DETECTED Final   Proteus species NOT DETECTED NOT DETECTED Final    Serratia marcescens NOT DETECTED NOT DETECTED Final   Haemophilus influenzae  NOT DETECTED NOT DETECTED Final   Neisseria meningitidis NOT DETECTED NOT DETECTED Final   Pseudomonas aeruginosa NOT DETECTED NOT DETECTED Final   Candida albicans NOT DETECTED NOT DETECTED Final   Candida glabrata NOT DETECTED NOT DETECTED Final   Candida krusei NOT DETECTED NOT DETECTED Final   Candida parapsilosis NOT DETECTED NOT DETECTED Final   Candida tropicalis NOT DETECTED NOT DETECTED Final  Blood culture (routine x 2)     Status: Abnormal (Preliminary result)   Collection Time: 11/18/16 12:50 AM  Result Value Ref Range Status   Specimen Description BLOOD LEFT FOREARM  Final   Special Requests   Final    BOTTLES DRAWN AEROBIC AND ANAEROBIC Blood Culture adequate volume   Culture  Setup Time   Final    GRAM POSITIVE COCCI IN PAIRS IN CHAINS IN BOTH AEROBIC AND ANAEROBIC BOTTLES CRITICAL VALUE NOTED.  VALUE IS CONSISTENT WITH PREVIOUSLY REPORTED AND CALLED VALUE.    Culture (A)  Final    STREPTOCOCCUS PNEUMONIAE SUSCEPTIBILITIES TO FOLLOW    Report Status PENDING  Incomplete  Urine culture     Status: None   Collection Time: 11/18/16  4:33 AM  Result Value Ref Range Status   Specimen Description URINE, CATHETERIZED  Final   Special Requests NONE  Final   Culture NO GROWTH  Final   Report Status 11/19/2016 FINAL  Final  Respiratory Panel by PCR     Status: None   Collection Time: 11/18/16  4:35 AM  Result Value Ref Range Status   Adenovirus NOT DETECTED NOT DETECTED Final   Coronavirus 229E NOT DETECTED NOT DETECTED Final   Coronavirus HKU1 NOT DETECTED NOT DETECTED Final   Coronavirus NL63 NOT DETECTED NOT DETECTED Final   Coronavirus OC43 NOT DETECTED NOT DETECTED Final   Metapneumovirus NOT DETECTED NOT DETECTED Final   Rhinovirus / Enterovirus NOT DETECTED NOT DETECTED Final   Influenza A NOT DETECTED NOT DETECTED Final   Influenza B NOT DETECTED NOT DETECTED Final   Parainfluenza  Virus 1 NOT DETECTED NOT DETECTED Final   Parainfluenza Virus 2 NOT DETECTED NOT DETECTED Final   Parainfluenza Virus 3 NOT DETECTED NOT DETECTED Final   Parainfluenza Virus 4 NOT DETECTED NOT DETECTED Final   Respiratory Syncytial Virus NOT DETECTED NOT DETECTED Final   Bordetella pertussis NOT DETECTED NOT DETECTED Final   Chlamydophila pneumoniae NOT DETECTED NOT DETECTED Final   Mycoplasma pneumoniae NOT DETECTED NOT DETECTED Final  Body fluid culture     Status: None (Preliminary result)   Collection Time: 11/18/16  4:01 PM  Result Value Ref Range Status   Specimen Description PLEURAL RIGHT  Final   Special Requests NONE  Final   Gram Stain   Final    ABUNDANT WBC PRESENT, PREDOMINANTLY MONONUCLEAR NO ORGANISMS SEEN    Culture NO GROWTH < 24 HOURS  Final   Report Status PENDING  Incomplete    Radiology Studies: Dg Chest 1 View  Result Date: 11/18/2016 CLINICAL DATA:  Followup thoracentesis. EXAM: CHEST 1 VIEW COMPARISON:  Earlier same day FINDINGS: Bilateral pleural effusions. Less pleural fluid seen on the right when compared to the immediate prior film. Some loculation does persist. Possible small amount of air in a loculation projected over the lower chest. No free pneumothorax. No other change. IMPRESSION: Less pleural density on the right than seen earlier. Persistent bilateral pleural effusions. Loculations on the right. Possible small amount of air within a loculation projected over the lower chest. No free pneumothorax. Electronically  Signed   By: Nelson Chimes M.D.   On: 11/18/2016 16:10   Dg Chest 2 View  Result Date: 11/19/2016 CLINICAL DATA:  Shortness of breath, cough. EXAM: CHEST  2 VIEW COMPARISON:  Radiographs of November 18, 2016. FINDINGS: Stable cardiomediastinal silhouette. No pneumothorax is noted. Small bilateral pleural effusions are noted ; right pleural effusion is probably loculated. Stable diffuse interstitial densities are noted throughout both lungs concerning  for edema or inflammation. Stable right upper lobe opacity is noted concerning for pneumonia or atelectasis or possibly loculated fluid component. IMPRESSION: Stable diffuse lung densities concerning for edema or inflammation with stable bilateral pleural effusions. Electronically Signed   By: Marijo Conception, M.D.   On: 11/19/2016 10:19   Ct Chest Wo Contrast  Result Date: 11/18/2016 CLINICAL DATA:  71 year old male with pleural effusion. History of prostate cancer and thoracic adenopathy. EXAM: CT CHEST WITHOUT CONTRAST TECHNIQUE: Multidetector CT imaging of the chest was performed following the standard protocol without IV contrast. COMPARISON:  Chest radiograph dated 11/18/2016 FINDINGS: Evaluation of this exam is limited in the absence of intravenous contrast. Cardiovascular: There is no cardiomegaly. No significant pericardial effusion. Coronary vascular calcification primarily involving the LAD and RCA. There is moderate atherosclerotic calcification of the thoracic aorta. Mediastinum/Nodes: Calcified hilar and mediastinal lymph nodes/granuloma. There is fullness of the hilar region which may represent mildly enlarged lymph nodes. Subcarinal mass/adenopathy or conglomerate of large lymph nodes with a combined dimension of approximately 5.5 x 5.7 cm (previously 5.3 x 4.3 cm on CT of 10/02/2016). Evaluation and comparison however is somewhat limited in the absence of contrast. Prevascular space adenopathy measures 14 mm in short axis. Grossly stable retrotracheal adenopathy as well as multiple enlarged lymph nodes in the AP window. The esophagus is grossly unremarkable as visualized. Lungs/Pleura: Small left and moderate right pleural effusion have increased in size compared to prior study. The right pleural effusion appears loculated and extends into the fissure. There is associated partial compressive atelectasis of the lower lobes. Superimposed infiltrate is not excluded. There is background of emphysema.  Evaluation of the previously described pulmonary nodules is limited due to increased pleural effusion and associated atelectasis or infiltrate. Upper Abdomen: The visualized upper abdomen is grossly unremarkable. Musculoskeletal: There is diffuse subcutaneous edema and anasarca. Scoliosis with degenerative changes of the spine. No acute fracture. IMPRESSION: 1. Increase in the size of the pleural effusion compared to the prior study with associated partial compressive atelectasis of the lower lobes. Pneumonia is not excluded. Clinical correlation is recommended. 2. Moderate emphysema. 3. Bulky mediastinal and mild bilateral axillary adenopathy. Evaluation of the lymph nodes is somewhat limited on this noncontrast study. 4. Diffuse soft tissue edema and anasarca. 5. Aortic Atherosclerosis (ICD10-I70.0) and Emphysema (ICD10-J43.9). 6. Electronically Signed   By: Anner Crete M.D.   On: 11/18/2016 05:21   Dg Chest Port 1 View  Result Date: 11/18/2016 CLINICAL DATA:  Leukocytosis and leg swelling. History of COPD, chronic bronchitis and prostate cancer. EXAM: PORTABLE CHEST 1 VIEW COMPARISON:  PET-CT October 31, 2016 and chest radiograph October 22, 2016 FINDINGS: Enlarging small to moderate RIGHT and small LEFT pleural effusions. Patchy consolidation projecting RIGHT upper lung zone in bilateral lower lung zone with diffuse interstitial prominence, increased from prior examination. Hyperinflation. Cardiac silhouette is mildly enlarged, calcified aortic knob. a increasing fullness of the LEFT aortopulmonary window corresponding to lymphadenopathy on prior CT chest. RIGHT upper lobe calcified granuloma. No pneumothorax. Broad dextroscoliosis with calcified mediastinal lymph nodes. IMPRESSION: Multifocal  consolidation with increasing interstitial prominence concerning for bronchopneumonia in a background of COPD. Enlarging small to moderate small LEFT pleural effusions. Followup PA and lateral chest X-ray is recommended  in 3-4 weeks following trial of antibiotic therapy to ensure resolution and exclude underlying malignancy. Mild cardiomegaly. Electronically Signed   By: Elon Alas M.D.   On: 11/18/2016 01:46   Ir Thoracentesis Asp Pleural Space W/img Guide  Result Date: 11/18/2016 INDICATION: Bilateral pleural effusions EXAM: ULTRASOUND GUIDED RIGHT THORACENTESIS MEDICATIONS: None. COMPLICATIONS: None immediate. PROCEDURE: An ultrasound guided thoracentesis was thoroughly discussed with the patient and questions answered. The benefits, risks, alternatives and complications were also discussed. The patient understands and wishes to proceed with the procedure. Written consent was obtained. Ultrasound was performed to localize and mark an adequate pocket of fluid in the right chest. The area was then prepped and draped in the normal sterile fashion. 1% Lidocaine was used for local anesthesia. Under ultrasound guidance a Safe-T-Centesis catheter was introduced. Thoracentesis was performed. The catheter was removed and a dressing applied. FINDINGS: A total of approximately 20 cc of clear yellow fluid was removed. Samples were sent to the laboratory as requested by the clinical team. IMPRESSION: Successful ultrasound guided right thoracentesis yielding 20 cc of pleural fluid. Electronically Signed   By: Marybelle Killings M.D.   On: 11/18/2016 16:31   Scheduled Meds: . ipratropium  0.5 mg Nebulization Q6H  . levalbuterol  0.63 mg Nebulization Q6H  . rivaroxaban  20 mg Oral Q supper  . sodium chloride flush  3 mL Intravenous Q12H  . vitamin C  1,000 mg Oral QODAY   Continuous Infusions: . sodium chloride    . cefTRIAXone (ROCEPHIN)  IV 2 g (11/19/16 1709)  . vancomycin 750 mg (11/19/16 1040)    LOS: 1 day   Kerney Elbe, DO [facility] Pager [phone]  If 7PM-7AM, please contact night-coverage [email or web address] Password [email or web address] 11/19/2016, 7:22 PM

## 2016-11-19 NOTE — Progress Notes (Signed)
  Echocardiogram 2D Echocardiogram has been performed.  Matilde Bash 11/19/2016, 3:17 PM

## 2016-11-19 NOTE — Progress Notes (Signed)
ANTIBIOTIC CONSULT NOTE - INITIAL  Pharmacy Consult for Rocephin Indication: Strep bacteremia  Allergies  Allergen Reactions  . Demerol [Meperidine] Other (See Comments)    UNSPECIFIED REACTION  Causes system to shutdown. ?   . Oxycodone Hcl Other (See Comments)    Pt states this medication 'wires him up' and makes pt hyper; pt does not want to take this ever again    Patient Measurements: Height: _0  (180.3 cm) Weight: 179 lb 4.8 oz (81.3 kg) IBW/kg (Calculated) : 75.3 Adjusted Body Weight:    Vital Signs: Temp: 98.9 F (37.2 C) (07/10 1100) Temp Source: Oral (07/10 1100) BP: 92/60 (07/10 0412) Pulse Rate: 79 (07/10 0412) Intake/Output from previous day: 07/09 0701 - 07/10 0700 In: 1353 [I.V.:903; IV Piggyback:450] Out: 8242 [Urine:1375] Intake/Output from this shift: Total I/O In: 123 [P.O.:120; I.V.:3] Out: -   Labs:  Recent Labs  11/17/16 2012 11/18/16 0453 11/19/16 0348  WBC 25.3* 23.9* 27.9*  HGB 12.7* 11.5* 11.6*  PLT 409* 381 336  CREATININE 1.48* 1.08 1.15   Estimated Creatinine Clearance: 60.9 mL/min (by C-G formula based on SCr of 1.15 mg/dL). No results for input(s): VANCOTROUGH, VANCOPEAK, VANCORANDOM, GENTTROUGH, GENTPEAK, GENTRANDOM, TOBRATROUGH, TOBRAPEAK, TOBRARND, AMIKACINPEAK, AMIKACINTROU, AMIKACIN in the last 72 hours.   Microbiology: Recent Results (from the past 720 hour(s))  Blood culture (routine x 2)     Status: Abnormal (Preliminary result)   Collection Time: 11/18/16 12:37 AM  Result Value Ref Range Status   Specimen Description BLOOD RIGHT ANTECUBITAL  Final   Special Requests   Final    BOTTLES DRAWN AEROBIC AND ANAEROBIC Blood Culture adequate volume   Culture  Setup Time   Final    GRAM POSITIVE COCCI IN PAIRS IN CHAINS IN BOTH AEROBIC AND ANAEROBIC BOTTLES CRITICAL RESULT CALLED TO, READ BACK BY AND VERIFIED WITH: Colin Rhein, PHARMD AT 3536 ON 11/18/16 BY C.JESSUP, MLT.    Culture STREPTOCOCCUS PNEUMONIAE (A)  Final   Report Status PENDING  Incomplete  Blood Culture ID Panel (Reflexed)     Status: Abnormal   Collection Time: 11/18/16 12:37 AM  Result Value Ref Range Status   Enterococcus species NOT DETECTED NOT DETECTED Final   Listeria monocytogenes NOT DETECTED NOT DETECTED Final   Staphylococcus species NOT DETECTED NOT DETECTED Final   Staphylococcus aureus NOT DETECTED NOT DETECTED Final   Streptococcus species DETECTED (A) NOT DETECTED Final    Comment: CRITICAL RESULT CALLED TO, READ BACK BY AND VERIFIED WITH: M. BELL, RPHARMD AT 1638 ON 11/18/16 BY C. JESSUP, MLT.    Streptococcus agalactiae NOT DETECTED NOT DETECTED Final   Streptococcus pneumoniae DETECTED (A) NOT DETECTED Final    Comment: CRITICAL RESULT CALLED TO, READ BACK BY AND VERIFIED WITH: M. BELL, RPHARMD AT 1638 ON 11/18/16 BY C. JESSUP, MLT.    Streptococcus pyogenes NOT DETECTED NOT DETECTED Final   Acinetobacter baumannii NOT DETECTED NOT DETECTED Final   Enterobacteriaceae species NOT DETECTED NOT DETECTED Final   Enterobacter cloacae complex NOT DETECTED NOT DETECTED Final   Escherichia coli NOT DETECTED NOT DETECTED Final   Klebsiella oxytoca NOT DETECTED NOT DETECTED Final   Klebsiella pneumoniae NOT DETECTED NOT DETECTED Final   Proteus species NOT DETECTED NOT DETECTED Final   Serratia marcescens NOT DETECTED NOT DETECTED Final   Haemophilus influenzae NOT DETECTED NOT DETECTED Final   Neisseria meningitidis NOT DETECTED NOT DETECTED Final   Pseudomonas aeruginosa NOT DETECTED NOT DETECTED Final   Candida albicans NOT DETECTED NOT DETECTED Final  Candida glabrata NOT DETECTED NOT DETECTED Final   Candida krusei NOT DETECTED NOT DETECTED Final   Candida parapsilosis NOT DETECTED NOT DETECTED Final   Candida tropicalis NOT DETECTED NOT DETECTED Final  Blood culture (routine x 2)     Status: Abnormal (Preliminary result)   Collection Time: 11/18/16 12:50 AM  Result Value Ref Range Status   Specimen Description BLOOD  LEFT FOREARM  Final   Special Requests   Final    BOTTLES DRAWN AEROBIC AND ANAEROBIC Blood Culture adequate volume   Culture  Setup Time   Final    GRAM POSITIVE COCCI IN PAIRS IN CHAINS IN BOTH AEROBIC AND ANAEROBIC BOTTLES CRITICAL VALUE NOTED.  VALUE IS CONSISTENT WITH PREVIOUSLY REPORTED AND CALLED VALUE.    Culture (A)  Final    STREPTOCOCCUS PNEUMONIAE SUSCEPTIBILITIES TO FOLLOW    Report Status PENDING  Incomplete  Urine culture     Status: None   Collection Time: 11/18/16  4:33 AM  Result Value Ref Range Status   Specimen Description URINE, CATHETERIZED  Final   Special Requests NONE  Final   Culture NO GROWTH  Final   Report Status 11/19/2016 FINAL  Final  Respiratory Panel by PCR     Status: None   Collection Time: 11/18/16  4:35 AM  Result Value Ref Range Status   Adenovirus NOT DETECTED NOT DETECTED Final   Coronavirus 229E NOT DETECTED NOT DETECTED Final   Coronavirus HKU1 NOT DETECTED NOT DETECTED Final   Coronavirus NL63 NOT DETECTED NOT DETECTED Final   Coronavirus OC43 NOT DETECTED NOT DETECTED Final   Metapneumovirus NOT DETECTED NOT DETECTED Final   Rhinovirus / Enterovirus NOT DETECTED NOT DETECTED Final   Influenza A NOT DETECTED NOT DETECTED Final   Influenza B NOT DETECTED NOT DETECTED Final   Parainfluenza Virus 1 NOT DETECTED NOT DETECTED Final   Parainfluenza Virus 2 NOT DETECTED NOT DETECTED Final   Parainfluenza Virus 3 NOT DETECTED NOT DETECTED Final   Parainfluenza Virus 4 NOT DETECTED NOT DETECTED Final   Respiratory Syncytial Virus NOT DETECTED NOT DETECTED Final   Bordetella pertussis NOT DETECTED NOT DETECTED Final   Chlamydophila pneumoniae NOT DETECTED NOT DETECTED Final   Mycoplasma pneumoniae NOT DETECTED NOT DETECTED Final  Body fluid culture     Status: None (Preliminary result)   Collection Time: 11/18/16  4:01 PM  Result Value Ref Range Status   Specimen Description PLEURAL RIGHT  Final   Special Requests NONE  Final   Gram Stain    Final    ABUNDANT WBC PRESENT, PREDOMINANTLY MONONUCLEAR NO ORGANISMS SEEN    Culture NO GROWTH < 24 HOURS  Final   Report Status PENDING  Incomplete    Medical History: Past Medical History:  Diagnosis Date  . Anxiety   . Arthritis    "neck" (08/15/2015)  . Chronic bronchitis ([place])   . Chronic diastolic CHF (congestive heart failure) ([place])   . Constipation   . COPD (chronic obstructive pulmonary disease) ([place])   . DDD (degenerative disc disease), cervical   . Depression   . Dilated aortic root ([place])    82m on echo 05/2016  . Dysrhythmia    A-Fib  cardioverted on 10-11-2016  . Edema of left lower extremity   . Facial basal cell cancer   . H/O acne vulgaris 1960s   [facility]  . Headache    history of - left temporal- years ago- not  current (08/15/2015)  . Persistent atrial fibrillation (HCC)    on coumadin with CHADS2VASC score of 2  . Pneumonia 1960s; 2015 X 2  . Prostate cancer ([place]) dx'd early 2000s   "low spreading; non aggressive type" (08/15/2015)  . Pulmonary HTN ([place])    PASP 23mHg by echo 05/2016  . Skin cancer    "back"    Assessment:  ID: HCAP. Tc 97.4. WBC 27.9 up. LA 1.77 (7/9)  Vanco 7/9>> Cefepime 7/9>>  7/9 blood x 2 - 2/2 strep pneumo 7/9 urine - neg  Goal of Therapy:  Eradication of infection  Plan:  D/c Cefepime Rocephin 2g IV q24h TTE and TEE to rule out endocarditis   Lyniah Fujita S. RAlford Highland PharmD, BCPS Clinical Staff Pharmacist Pager [phone] REilene GhaziStillinger 11/19/2016,2:49 PM

## 2016-11-19 NOTE — Plan of Care (Signed)
Problem: Safety: Goal: Ability to remain free from injury will improve Outcome: Progressing Remains free from injury.  Problem: Pain Managment: Goal: General experience of comfort will improve Outcome: Progressing Continued having leg cramping; given PRN Tramadol and  relieved by heat applied with some relief verbalized.  Problem: Physical Regulation: Goal: Ability to maintain clinical measurements within normal limits will improve Outcome: Progressing SBP remains low but stable in 90's

## 2016-11-19 NOTE — Progress Notes (Signed)
Initial Nutrition Assessment  DOCUMENTATION CODES:   Severe malnutrition in context of chronic illness  INTERVENTION:    Ensure Enlive po TID, each supplement provides 350 kcal and 20 grams of protein  NUTRITION DIAGNOSIS:   Malnutrition (severe) related to chronic illness (COPD, CHF) as evidenced by severe depletion of body fat, severe depletion of muscle mass, severe fluid accumulation.  GOAL:   Patient will meet greater than or equal to 90% of their needs  MONITOR:   PO intake, Supplement acceptance, I & O's  REASON FOR ASSESSMENT:   Consult Assessment of nutrition requirement/status  ASSESSMENT:   71 yo male with PMH of COPD, CHF, LE edema, PNA, DDD, pulmonary HTN who was admitted on 7/8 with LE swelling and HCAP.  Patient reports that he has been eating poorly for a while. He has lost weight, but unable to quantify given volume overload. Down to 147 lbs 1 month ago. He drinks Ensure sometimes, but c/o it being so expensive. Nutrition-Focused physical exam completed. Findings are severe fat depletion, severe muscle depletion, and severe edema.  Labs reviewed: sodium 128 (L) Medications reviewed and include vitamin C.  Diet Order:  Diet Heart Room service appropriate? Yes; Fluid consistency: Thin  Skin:  Reviewed, no issues  Last BM:  7/8  Height:   Ht Readings from Last 1 Encounters:  11/17/16 _0  (1.803 m)    Weight:   Wt Readings from Last 1 Encounters:  11/19/16 179 lb 4.8 oz (81.3 kg)    Ideal Body Weight:  78.2 kg  BMI:  Body mass index is 25.01 kg/m.  Estimated Nutritional Needs:   Kcal:  2000-2200  Protein:  100-120 gm  Fluid:  1.8-2 L  EDUCATION NEEDS:   No education needs identified at this time  Molli Barrows, [place], [place], [place] Pager [phone] After Hours Pager [phone]

## 2016-11-19 NOTE — Consult Note (Signed)
[name] for Infectious Disease    Date of Admission:  11/17/2016    Total days of antibiotics 2         Day 2 of Cefepime.         Day 2 of vancomycin.         Zosyn for 1 dose.       Reason for Consult: Streptococcus pneumoniae bacteremia secondary to pneumonia.    Referring Provider: Kerney Elbe, DO Primary Care Provider: [place],[name] at primary care at [facility].  Assessment: Streptococcal pneumonia complicated with bacteremia and parapneumonic effusion.  [name] who came with presentation of worsening CHF, found to have bilateral pleural effusion more pronounce on right with possible underlying pneumonia. He has an history of VATS done in January 2018. He does not exhibit signs of meningeal irritation but certainly  at risk of developing meningitis and endocarditis. Patient is a [place] patient and states that he did got pneumococcal vaccination in the past 5 years, unable to find any records.  Plan: 1.TTE and TEE to rule out endocarditis. 2. Replace cefepime with ceftriaxone. 3. Continue vancomycin. 4. Duration of IV antibiotics will be determined after ruling out endocarditis. 5. Repeat blood cultures tomorrow morning-PICC line will be placed once blood culture becomes negative. 6. He might need cardiothoracic surgery consult for persistent loculated right pleural effusion.  Principal Problem:   Streptococcal bacteremia Active Problems:   COPD (chronic obstructive pulmonary disease) (HCC)   Acute respiratory failure with hypoxia (HCC)   Chronic diastolic CHF (congestive heart failure) (HCC)   Hyponatremia   Pneumonia   HCAP (healthcare-associated pneumonia)   . ipratropium  0.5 mg Nebulization Q6H  . levalbuterol  0.63 mg Nebulization Q6H  . rivaroxaban  20 mg Oral Q supper  . sodium chloride flush  3 mL Intravenous Q12H  . vitamin C  1,000 mg Oral QODAY    HPI: [name] is a 71 y.o. male with PMHx significant for HFpEF,  paroxysmal A. Fib (on Xarelto),COPD,Prostate cancer with possible mets to lymph nodes, previous empyema s/p right VATS in Jan 2018,  cervical lymph node biopsy in June 2018 which was negative for lung cancer,PET scan with adenopathy within chest, abdomen and pelvis, suspicious for metastatic disease from prostate cancer presented with worsening shortness of breath and bilateral leg swelling more worse on the right. Patient denies any fever or chills, no worsening cough or sputum production, denies any chest pain, denies any headache or photophobia or change in vision, he denies any worsening neck pain. He do complained of mild abdominal discomfort and constipation for the last 3 days. Chest x-ray and CT chest suggestive of worsening right-sided pleural effusion, partial compression atelectasis//bilateral infiltrate/multiple in large lymph node, found to have urinary strep pneumonia antigen and blood culture positive with strep pneumococcus. ID was consulted for strep pneumococcal bacteremia secondary to pneumonia.   Review of Systems: ROS  Past Medical History:  Diagnosis Date  . Anxiety   . Arthritis    "neck" (08/15/2015)  . Chronic bronchitis ([place])   . Chronic diastolic CHF (congestive heart failure) ([place])   . Constipation   . COPD (chronic obstructive pulmonary disease) ([employer])   . DDD (degenerative disc disease), cervical   . Depression   . Dilated aortic root ([place])    32m on echo 05/2016  . Dysrhythmia    A-Fib  cardioverted on 10-11-2016  . Edema of left lower extremity   .  Facial basal cell cancer   . H/O acne vulgaris 1960s   "led to my discharge from the [facility] in the mid 1960s"  . Headache    history of - left temporal- years ago- not current (08/15/2015)  . Persistent atrial fibrillation (HCC)    on coumadin with CHADS2VASC score of 2  . Pneumonia 1960s; 2015 X 2  . [place] cancer ([place] dx'd early 2000s   "low spreading; non aggressive type" (08/15/2015)  . Pulmonary HTN ([name])     PASP 60mHg by echo 05/2016  . Skin cancer    "back"    Social History  Substance Use Topics  . Smoking status: Former Smoker    Packs/day: 1.00    Years: 62.00    Types: Cigarettes    Quit date: 05/14/2016  . Smokeless tobacco: Never Used  . Alcohol use No    Family History  Problem Relation Age of Onset  . Cancer Mother        colon cancer  . Alcoholism Father   . CAD Father   . Alcohol abuse Father   . Ovarian cancer Sister   . Diabetes Neg Hx    Allergies  Allergen Reactions  . Demerol [Meperidine] Other (See Comments)    UNSPECIFIED REACTION  Causes system to shutdown. ?   . Oxycodone Hcl Other (See Comments)    Pt states this medication 'wires him up' and makes pt hyper; pt does not want to take this ever again    OBJECTIVE: Blood pressure 92/60, pulse 79, temperature 98.9 F (37.2 C), temperature source Oral, resp. rate 15, height _0  (1.803 m), weight 179 lb 4.8 oz (81.3 kg), SpO2 94 %.  Physical Exam  Vitals:   11/19/16 0008 11/19/16 0412 11/19/16 0823 11/19/16 1100  BP: (!) 91/57 92/60    Pulse: 94 79    Resp: (!) 24 15    Temp: 97.9 F (36.6 C) 97.6 F (36.4 C)  98.9 F (37.2 C)  TempSrc: Oral Oral  Oral  SpO2: 94% 96% 94%   Weight:  179 lb 4.8 oz (81.3 kg)    Height:       General: Vital signs reviewed.  Patient is well-developed and well-nourished, in no acute distress and cooperative with exam.  Cardiovascular: Irregular rhythm with mild tachycardia. Pulmonary/Chest: Decreased breath sound at right mid and lower zones. No wheezing. Abdominal: Soft, non-tender, non-distended, BS + Extremities: 2+ pitting edema on the left and 3+ on right lower extremity with calf tenderness on right leg.No erythema. Neurological: A&O x3, Strength is normal and symmetric bilaterally, cranial nerve II-XII are grossly intact, no focal motor deficit, sensory intact to light touch bilaterally.  no meningeal sign. Skin: Warm, dry and intact. No rashes or  erythema. Psychiatric: Normal mood and affect. speech and behavior is normal. Cognition and memory are normal.  Lab Results Lab Results  Component Value Date   WBC 27.9 (H) 11/19/2016   HGB 11.6 (L) 11/19/2016   HCT 35.0 (L) 11/19/2016   MCV 91.6 11/19/2016   PLT 336 11/19/2016    Lab Results  Component Value Date   CREATININE 1.15 11/19/2016   BUN 41 (H) 11/19/2016   NA 128 (L) 11/19/2016   K 4.5 11/19/2016   CL 99 (L) 11/19/2016   CO2 19 (L) 11/19/2016    Lab Results  Component Value Date   ALT 16 (L) 11/19/2016   AST 26 11/19/2016   ALKPHOS 181 (H) 11/19/2016   BILITOT 0.8 11/19/2016  Microbiology: Recent Results (from the past 240 hour(s))  Blood culture (routine x 2)     Status: Abnormal (Preliminary result)   Collection Time: 11/18/16 12:37 AM  Result Value Ref Range Status   Specimen Description BLOOD RIGHT ANTECUBITAL  Final   Special Requests   Final    BOTTLES DRAWN AEROBIC AND ANAEROBIC Blood Culture adequate volume   Culture  Setup Time   Final    GRAM POSITIVE COCCI IN PAIRS IN CHAINS IN BOTH AEROBIC AND ANAEROBIC BOTTLES CRITICAL RESULT CALLED TO, READ BACK BY AND VERIFIED WITH: Colin Rhein, PHARMD AT 1245 ON 11/18/16 BY C.JESSUP, MLT.    Culture STREPTOCOCCUS PNEUMONIAE (A)  Final   Report Status PENDING  Incomplete  Blood Culture ID Panel (Reflexed)     Status: Abnormal   Collection Time: 11/18/16 12:37 AM  Result Value Ref Range Status   Enterococcus species NOT DETECTED NOT DETECTED Final   Listeria monocytogenes NOT DETECTED NOT DETECTED Final   Staphylococcus species NOT DETECTED NOT DETECTED Final   Staphylococcus aureus NOT DETECTED NOT DETECTED Final   Streptococcus species DETECTED (A) NOT DETECTED Final    Comment: CRITICAL RESULT CALLED TO, READ BACK BY AND VERIFIED WITH: M. BELL, RPHARMD AT 1638 ON 11/18/16 BY C. JESSUP, MLT.    Streptococcus agalactiae NOT DETECTED NOT DETECTED Final   Streptococcus pneumoniae DETECTED (A) NOT DETECTED  Final    Comment: CRITICAL RESULT CALLED TO, READ BACK BY AND VERIFIED WITH: M. BELL, RPHARMD AT 1638 ON 11/18/16 BY C. JESSUP, MLT.    Streptococcus pyogenes NOT DETECTED NOT DETECTED Final   Acinetobacter baumannii NOT DETECTED NOT DETECTED Final   Enterobacteriaceae species NOT DETECTED NOT DETECTED Final   Enterobacter cloacae complex NOT DETECTED NOT DETECTED Final   Escherichia coli NOT DETECTED NOT DETECTED Final   Klebsiella oxytoca NOT DETECTED NOT DETECTED Final   Klebsiella pneumoniae NOT DETECTED NOT DETECTED Final   Proteus species NOT DETECTED NOT DETECTED Final   Serratia marcescens NOT DETECTED NOT DETECTED Final   Haemophilus influenzae NOT DETECTED NOT DETECTED Final   Neisseria meningitidis NOT DETECTED NOT DETECTED Final   Pseudomonas aeruginosa NOT DETECTED NOT DETECTED Final   Candida albicans NOT DETECTED NOT DETECTED Final   Candida glabrata NOT DETECTED NOT DETECTED Final   Candida krusei NOT DETECTED NOT DETECTED Final   Candida parapsilosis NOT DETECTED NOT DETECTED Final   Candida tropicalis NOT DETECTED NOT DETECTED Final  Blood culture (routine x 2)     Status: None (Preliminary result)   Collection Time: 11/18/16 12:50 AM  Result Value Ref Range Status   Specimen Description BLOOD LEFT FOREARM  Final   Special Requests   Final    BOTTLES DRAWN AEROBIC AND ANAEROBIC Blood Culture adequate volume   Culture  Setup Time   Final    GRAM POSITIVE COCCI IN PAIRS IN CHAINS IN BOTH AEROBIC AND ANAEROBIC BOTTLES CRITICAL VALUE NOTED.  VALUE IS CONSISTENT WITH PREVIOUSLY REPORTED AND CALLED VALUE.    Culture GRAM POSITIVE COCCI  Final   Report Status PENDING  Incomplete  Urine culture     Status: None   Collection Time: 11/18/16  4:33 AM  Result Value Ref Range Status   Specimen Description URINE, CATHETERIZED  Final   Special Requests NONE  Final   Culture NO GROWTH  Final   Report Status 11/19/2016 FINAL  Final  Respiratory Panel by PCR     Status: None    Collection Time: 11/18/16  4:35  AM  Result Value Ref Range Status   Adenovirus NOT DETECTED NOT DETECTED Final   Coronavirus 229E NOT DETECTED NOT DETECTED Final   Coronavirus HKU1 NOT DETECTED NOT DETECTED Final   Coronavirus NL63 NOT DETECTED NOT DETECTED Final   Coronavirus OC43 NOT DETECTED NOT DETECTED Final   Metapneumovirus NOT DETECTED NOT DETECTED Final   Rhinovirus / Enterovirus NOT DETECTED NOT DETECTED Final   Influenza A NOT DETECTED NOT DETECTED Final   Influenza B NOT DETECTED NOT DETECTED Final   Parainfluenza Virus 1 NOT DETECTED NOT DETECTED Final   Parainfluenza Virus 2 NOT DETECTED NOT DETECTED Final   Parainfluenza Virus 3 NOT DETECTED NOT DETECTED Final   Parainfluenza Virus 4 NOT DETECTED NOT DETECTED Final   Respiratory Syncytial Virus NOT DETECTED NOT DETECTED Final   Bordetella pertussis NOT DETECTED NOT DETECTED Final   Chlamydophila pneumoniae NOT DETECTED NOT DETECTED Final   Mycoplasma pneumoniae NOT DETECTED NOT DETECTED Final  Body fluid culture     Status: None (Preliminary result)   Collection Time: 11/18/16  4:01 PM  Result Value Ref Range Status   Specimen Description PLEURAL RIGHT  Final   Special Requests NONE  Final   Gram Stain   Final    ABUNDANT WBC PRESENT, PREDOMINANTLY MONONUCLEAR NO ORGANISMS SEEN    Culture NO GROWTH < 24 HOURS  Final   Report Status PENDING  Incomplete    Lorella Nimrod, [place] [facility] Infectious [facility] [place] [phone] pager   [phone] cell 11/19/2016, 2:05 PM

## 2016-11-19 NOTE — Progress Notes (Signed)
  Subjective: Patient examined, CT scan of chest and recent transthoracic echocardiogram images personally reviewed and counseled with patient  Patient admitted with malaise edema right leg pain and positive blood cultures gram-positive cocci  Since I saw the patient last in the office he has developed a moderate right pleural effusion . Ultrasound guided aspiration returned 20 cc. I would recommend CT-guided pigtail catheter to drain the collection--I doubt this is the source of his bacteremia but it could easily become infected and lead to empyema recurrence.  Cardiac function appears to be adequate. He is in atrial fibrillation-chronic No valvular vegetations or insufficiency noted. Tricuspid valve regurgitation is chronic from his lung disease-cor pulmonale  Objective: Vital signs in last 24 hours: Temp:  [97.6 F (36.4 C)-98.9 F (37.2 C)] 97.9 F (36.6 C) (07/10 1700) Pulse Rate:  [79-94] 79 (07/10 0412) Cardiac Rhythm: Atrial flutter (07/10 0823) Resp:  [15-24] 15 (07/10 0412) BP: (90-92)/(57-60) 92/60 (07/10 0412) SpO2:  [94 %-99 %] 96 % (07/10 1402) Weight:  [179 lb 4.8 oz (81.3 kg)] 179 lb 4.8 oz (81.3 kg) (07/10 0412)  Hemodynamic parameters for last 24 hours:   Afebrile Intake/Output from previous day: 07/09 0701 - 07/10 0700 In: 1353 [I.V.:903; IV Piggyback:450] Out: 1375 [Urine:1375] Intake/Output this shift: Total I/O In: 123 [P.O.:120; I.V.:3] Out: -        Exam    General- alert and comfortable but chronically ill   Lungs- clear without rales, wheezes   Cor- atrial fibrillation , no murmur , gallop   Abdomen- soft, non-tender   Extremities - warm, non-tender, 2+ edema   Neuro- oriented, appropriate, no focal weakness   Lab Results:  Recent Labs  11/18/16 0453 11/19/16 0348  WBC 23.9* 27.9*  HGB 11.5* 11.6*  HCT 35.4* 35.0*  PLT 381 336   BMET:  Recent Labs  11/18/16 0453 11/19/16 0348  NA 126* 128*  K 5.5* 4.5  CL 95* 99*  CO2 22  19*  GLUCOSE 111* 104*  BUN 39* 41*  CREATININE 1.08 1.15  CALCIUM 7.7* 8.0*    PT/INR:  Recent Labs  11/18/16 1830  LABPROT 37.4*  INR 3.68   ABG    Component Value Date/Time   PHART 7.467 (H) 05/22/2016 0354   HCO3 29.7 (H) 05/22/2016 0354   TCO2 31 05/22/2016 0354   O2SAT 93.0 05/22/2016 0354   CBG (last 3)   Recent Labs  11/19/16 0753 11/19/16 1150 11/19/16 1530  GLUCAP 99 145* 89    Assessment/Plan: S/P  IR evaluation for right CT-guided pigtail catheter has been requested. Will follow.   LOS: 1 day    Tharon Aquas Trigt III 11/19/2016

## 2016-11-20 ENCOUNTER — Inpatient Hospital Stay (HOSPITAL_COMMUNITY): Payer: Medicare Other

## 2016-11-20 DIAGNOSIS — R609 Edema, unspecified: Secondary | ICD-10-CM

## 2016-11-20 LAB — CBC WITH DIFFERENTIAL/PLATELET
Basophils Absolute: 0 10*3/uL (ref 0.0–0.1)
Basophils Relative: 0 %
EOS ABS: 0.1 10*3/uL (ref 0.0–0.7)
Eosinophils Relative: 0 %
HCT: 36.3 % — ABNORMAL LOW (ref 39.0–52.0)
HEMOGLOBIN: 12.1 g/dL — AB (ref 13.0–17.0)
LYMPHS ABS: 1.3 10*3/uL (ref 0.7–4.0)
Lymphocytes Relative: 6 %
MCH: 30.1 pg (ref 26.0–34.0)
MCHC: 33.3 g/dL (ref 30.0–36.0)
MCV: 90.3 fL (ref 78.0–100.0)
Monocytes Absolute: 1.9 10*3/uL — ABNORMAL HIGH (ref 0.1–1.0)
Monocytes Relative: 8 %
NEUTROS PCT: 86 %
Neutro Abs: 20.5 10*3/uL — ABNORMAL HIGH (ref 1.7–7.7)
Platelets: 394 10*3/uL (ref 150–400)
RBC: 4.02 MIL/uL — AB (ref 4.22–5.81)
RDW: 14.8 % (ref 11.5–15.5)
WBC: 23.8 10*3/uL — AB (ref 4.0–10.5)

## 2016-11-20 LAB — COMPREHENSIVE METABOLIC PANEL
ALBUMIN: 1.4 g/dL — AB (ref 3.5–5.0)
ALK PHOS: 245 U/L — AB (ref 38–126)
ALT: 23 U/L (ref 17–63)
AST: 37 U/L (ref 15–41)
Anion gap: 7 (ref 5–15)
BUN: 36 mg/dL — AB (ref 6–20)
CALCIUM: 8.3 mg/dL — AB (ref 8.9–10.3)
CO2: 25 mmol/L (ref 22–32)
CREATININE: 1 mg/dL (ref 0.61–1.24)
Chloride: 98 mmol/L — ABNORMAL LOW (ref 101–111)
GFR calc Af Amer: >60 mL/min (ref >60)
GFR calc non Af Amer: >60 mL/min (ref >60)
GLUCOSE: 94 mg/dL (ref 65–99)
Potassium: 4.2 mmol/L (ref 3.5–5.1)
SODIUM: 130 mmol/L — AB (ref 135–145)
Total Bilirubin: 0.8 mg/dL (ref 0.3–1.2)
Total Protein: 6.2 g/dL — ABNORMAL LOW (ref 6.5–8.1)

## 2016-11-20 LAB — GLUCOSE, CAPILLARY
GLUCOSE-CAPILLARY: 85 mg/dL (ref 65–99)
Glucose-Capillary: 74 mg/dL (ref 65–99)
Glucose-Capillary: 82 mg/dL (ref 65–99)
Glucose-Capillary: 138 mg/dL — ABNORMAL HIGH (ref 65–99)

## 2016-11-20 LAB — CULTURE, BLOOD (ROUTINE X 2)
SPECIAL REQUESTS: ADEQUATE
Special Requests: ADEQUATE

## 2016-11-20 LAB — PHOSPHORUS: Phosphorus: 3.6 mg/dL (ref 2.5–4.6)

## 2016-11-20 LAB — MAGNESIUM: Magnesium: 1.9 mg/dL (ref 1.7–2.4)

## 2016-11-20 LAB — VANCOMYCIN, TROUGH: Vancomycin Tr: 17 ug/mL (ref 15–20)

## 2016-11-20 MED ORDER — RIVAROXABAN 20 MG PO TABS: 20.0000 mg | ORAL_TABLET | Freq: Every day | ORAL | Status: DC

## 2016-11-20 MED ORDER — ACETAMINOPHEN 325 MG PO TABS
650.0000 mg | ORAL_TABLET | Freq: Four times a day (QID) | ORAL | Status: DC | PRN
  Administered 2016-11-20: 650 mg via ORAL
  Filled 2016-11-20: qty 2

## 2016-11-20 MED ORDER — RIVAROXABAN 20 MG PO TABS
20.0000 mg | ORAL_TABLET | Freq: Every day | ORAL | Status: DC
  Administered 2016-11-20 – 2016-11-23 (×4): 20 mg via ORAL
  Filled 2016-11-20 (×4): qty 1

## 2016-11-20 MED ORDER — LEVALBUTEROL HCL 0.63 MG/3ML IN NEBU
0.6300 mg | INHALATION_SOLUTION | Freq: Three times a day (TID) | RESPIRATORY_TRACT | Status: DC
  Administered 2016-11-20 – 2016-11-23 (×11): 0.63 mg via RESPIRATORY_TRACT
  Filled 2016-11-20 (×12): qty 3

## 2016-11-20 MED ORDER — FUROSEMIDE 10 MG/ML IJ SOLN
40.0000 mg | Freq: Two times a day (BID) | INTRAMUSCULAR | Status: AC
  Administered 2016-11-20 – 2016-11-21 (×2): 40 mg via INTRAVENOUS
  Filled 2016-11-20 (×2): qty 4

## 2016-11-20 MED ORDER — IPRATROPIUM BROMIDE 0.02 % IN SOLN
0.5000 mg | Freq: Three times a day (TID) | RESPIRATORY_TRACT | Status: DC
  Administered 2016-11-20 – 2016-11-23 (×11): 0.5 mg via RESPIRATORY_TRACT
  Filled 2016-11-20 (×12): qty 2.5

## 2016-11-20 NOTE — Progress Notes (Signed)
[place] for Vancomycin Indication: HCAP and bacteremia  Allergies  Allergen Reactions  . Demerol [Meperidine] Other (See Comments)    UNSPECIFIED REACTION  Causes system to shutdown. ?   . Oxycodone Hcl Other (See Comments)    Pt states this medication 'wires him up' and makes pt hyper; pt does not want to take this ever again    Patient Measurements: Height: _0  (180.3 cm) Weight: 176 lb 6.4 oz (80 kg) IBW/kg (Calculated) : 75.3 Adjusted Body Weight:    Vital Signs: Temp: 98.4 F (36.9 C) (07/11 0802) Temp Source: Oral (07/11 0802) BP: 108/64 (07/11 0802) Pulse Rate: 86 (07/11 0802) Intake/Output from previous day: 07/10 0701 - 07/11 0700 In: 473 [P.O.:120; I.V.:3; IV Piggyback:350] Out: 200 [Urine:200] Intake/Output from this shift: No intake/output data recorded.  Labs:  Recent Labs  11/18/16 0453 11/19/16 0348 11/20/16 0547  WBC 23.9* 27.9* 23.8*  HGB 11.5* 11.6* 12.1*  PLT 381 336 394  CREATININE 1.08 1.15 1.00   Estimated Creatinine Clearance: 70.1 mL/min (by C-G formula based on SCr of 1 mg/dL).  Recent Labs  11/20/16 0944  [place]     Microbiology: Recent Results (from the past 720 hour(s))  Blood culture (routine x 2)     Status: Abnormal   Collection Time: 11/18/16 12:37 AM  Result Value Ref Range Status   Specimen Description BLOOD RIGHT ANTECUBITAL  Final   Special Requests   Final    BOTTLES DRAWN AEROBIC AND ANAEROBIC Blood Culture adequate volume   Culture  Setup Time   Final    GRAM POSITIVE COCCI IN PAIRS IN CHAINS IN BOTH AEROBIC AND ANAEROBIC BOTTLES CRITICAL RESULT CALLED TO, READ BACK BY AND VERIFIED WITH: Colin Rhein, PHARMD AT 1638 ON 11/18/16 BY C.JESSUP, MLT.    Culture STREPTOCOCCUS PNEUMONIAE (A)  Final   Report Status 11/20/2016 FINAL  Final   Organism ID, Bacteria STREPTOCOCCUS PNEUMONIAE  Final      Susceptibility   Streptococcus pneumoniae - MIC*    ERYTHROMYCIN <=0.12  SENSITIVE Sensitive     LEVOFLOXACIN 0.5 SENSITIVE Sensitive     PENICILLIN (meningitis) 0.5 RESISTANT Resistant     PENICILLIN (non-meningitis) 0.5 SENSITIVE Sensitive     CEFTRIAXONE (non-meningitis) 0.5 SENSITIVE Sensitive     CEFTRIAXONE (meningitis) 0.5 SENSITIVE Sensitive     * STREPTOCOCCUS PNEUMONIAE  Blood Culture ID Panel (Reflexed)     Status: Abnormal   Collection Time: 11/18/16 12:37 AM  Result Value Ref Range Status   Enterococcus species NOT DETECTED NOT DETECTED Final   Listeria monocytogenes NOT DETECTED NOT DETECTED Final   Staphylococcus species NOT DETECTED NOT DETECTED Final   Staphylococcus aureus NOT DETECTED NOT DETECTED Final   Streptococcus species DETECTED (A) NOT DETECTED Final    Comment: CRITICAL RESULT CALLED TO, READ BACK BY AND VERIFIED WITH: M. BELL, RPHARMD AT 1638 ON 11/18/16 BY C. JESSUP, MLT.    Streptococcus agalactiae NOT DETECTED NOT DETECTED Final   Streptococcus pneumoniae DETECTED (A) NOT DETECTED Final    Comment: CRITICAL RESULT CALLED TO, READ BACK BY AND VERIFIED WITH: M. BELL, RPHARMD AT 1638 ON 11/18/16 BY C. JESSUP, MLT.    Streptococcus pyogenes NOT DETECTED NOT DETECTED Final   Acinetobacter baumannii NOT DETECTED NOT DETECTED Final   Enterobacteriaceae species NOT DETECTED NOT DETECTED Final   Enterobacter cloacae complex NOT DETECTED NOT DETECTED Final   Escherichia coli NOT DETECTED NOT DETECTED Final   Klebsiella oxytoca NOT DETECTED NOT  DETECTED Final   Klebsiella pneumoniae NOT DETECTED NOT DETECTED Final   Proteus species NOT DETECTED NOT DETECTED Final   Serratia marcescens NOT DETECTED NOT DETECTED Final   Haemophilus influenzae NOT DETECTED NOT DETECTED Final   Neisseria meningitidis NOT DETECTED NOT DETECTED Final   Pseudomonas aeruginosa NOT DETECTED NOT DETECTED Final   Candida albicans NOT DETECTED NOT DETECTED Final   Candida glabrata NOT DETECTED NOT DETECTED Final   Candida krusei NOT DETECTED NOT DETECTED Final    Candida parapsilosis NOT DETECTED NOT DETECTED Final   Candida tropicalis NOT DETECTED NOT DETECTED Final  Blood culture (routine x 2)     Status: Abnormal   Collection Time: 11/18/16 12:50 AM  Result Value Ref Range Status   Specimen Description BLOOD LEFT FOREARM  Final   Special Requests   Final    BOTTLES DRAWN AEROBIC AND ANAEROBIC Blood Culture adequate volume   Culture  Setup Time   Final    GRAM POSITIVE COCCI IN PAIRS IN CHAINS IN BOTH AEROBIC AND ANAEROBIC BOTTLES CRITICAL VALUE NOTED.  VALUE IS CONSISTENT WITH PREVIOUSLY REPORTED AND CALLED VALUE.    Culture (A)  Final    STREPTOCOCCUS PNEUMONIAE SUSCEPTIBILITIES PERFORMED ON PREVIOUS CULTURE WITHIN THE LAST 5 DAYS.    Report Status 11/20/2016 FINAL  Final  Urine culture     Status: None   Collection Time: 11/18/16  4:33 AM  Result Value Ref Range Status   Specimen Description URINE, CATHETERIZED  Final   Special Requests NONE  Final   Culture NO GROWTH  Final   Report Status 11/19/2016 FINAL  Final  Respiratory Panel by PCR     Status: None   Collection Time: 11/18/16  4:35 AM  Result Value Ref Range Status   Adenovirus NOT DETECTED NOT DETECTED Final   Coronavirus 229E NOT DETECTED NOT DETECTED Final   Coronavirus HKU1 NOT DETECTED NOT DETECTED Final   Coronavirus NL63 NOT DETECTED NOT DETECTED Final   Coronavirus OC43 NOT DETECTED NOT DETECTED Final   Metapneumovirus NOT DETECTED NOT DETECTED Final   Rhinovirus / Enterovirus NOT DETECTED NOT DETECTED Final   Influenza A NOT DETECTED NOT DETECTED Final   Influenza B NOT DETECTED NOT DETECTED Final   Parainfluenza Virus 1 NOT DETECTED NOT DETECTED Final   Parainfluenza Virus 2 NOT DETECTED NOT DETECTED Final   Parainfluenza Virus 3 NOT DETECTED NOT DETECTED Final   Parainfluenza Virus 4 NOT DETECTED NOT DETECTED Final   Respiratory Syncytial Virus NOT DETECTED NOT DETECTED Final   Bordetella pertussis NOT DETECTED NOT DETECTED Final   Chlamydophila pneumoniae  NOT DETECTED NOT DETECTED Final   Mycoplasma pneumoniae NOT DETECTED NOT DETECTED Final  Body fluid culture     Status: None (Preliminary result)   Collection Time: 11/18/16  4:01 PM  Result Value Ref Range Status   Specimen Description PLEURAL RIGHT  Final   Special Requests NONE  Final   Gram Stain   Final    ABUNDANT WBC PRESENT, PREDOMINANTLY MONONUCLEAR NO ORGANISMS SEEN    Culture NO GROWTH < 24 HOURS  Final   Report Status PENDING  Incomplete    Medical History: Past Medical History:  Diagnosis Date  . Anxiety   . Arthritis    "neck" (08/15/2015)  . Chronic bronchitis ([place])   . Chronic diastolic CHF (congestive heart failure) ([place])   . Constipation   . COPD (chronic obstructive pulmonary disease) ([place])   . DDD (degenerative disc disease), cervical   . Depression   .  Dilated aortic root ([place])    108m on echo 05/2016  . Dysrhythmia    A-Fib  cardioverted on 10-11-2016  . Edema of left lower extremity   . Facial basal cell cancer   . H/O acne vulgaris 1960s   [facility]  . Headache    history of - left temporal- years ago- not current (08/15/2015)  . Persistent atrial fibrillation (HCC)    on coumadin with CHADS2VASC score of 2  . Pneumonia 1960s; 2015 X 2  . Prostate cancer [place] early 2000s   "low spreading; non aggressive type" (08/15/2015)  . Pulmonary HTN ([place]    PASP 477mg by echo 05/2016  . Skin cancer    "back"   Assessment:  ID: HCAP and strep bacteremia. Afebrile. WBC 23.8 down (no steroids). LA 1.77 (7/9). TTE negative  Vanco 7/9>> --7/11 VT: 17 ok on 75053mV q12h. Rocephin 7/10 Cefepime 7/9>>7/10  7/11 BC x 2>> 7/9 blood x 2 - 2/2 strep pneumo 7/9 urine - neg 7/9: Pleural fluid>>  Goal of Therapy:  Vancomycin trough level 15-20 mcg/ml  Plan:  Plan:  Continue Vancomycin 750m21m q12h Rocephin 2g IV q24h F/u strep sensitivities   Damaris Abeln S. RobeAlford HighlandarmD, BCPS Clinical Staff  Pharmacist Pager [phone]1/2018,10:44 AM

## 2016-11-20 NOTE — Discharge Instructions (Addendum)
Information on my medicine - XARELTO (Rivaroxaban)  This medication education was reviewed with me or my healthcare representative as part of my discharge preparation.  The pharmacist that spoke with me during my hospital stay was:  Wayland Salinas, [facility]  Why was Xarelto prescribed for you? Xarelto was prescribed for you to reduce the risk of a blood clot forming that can cause a stroke if you have a medical condition called atrial fibrillation (a type of irregular heartbeat).  What do you need to know about xarelto ? Take your Xarelto ONCE DAILY at the same time every day with your evening meal. If you have difficulty swallowing the tablet whole, you may crush it and mix in applesauce just prior to taking your dose.  Take Xarelto exactly as prescribed by your doctor and DO NOT stop taking Xarelto without talking to the doctor who prescribed the medication.  Stopping without other stroke prevention medication to take the place of Xarelto may increase your risk of developing a clot that causes a stroke.  Refill your prescription before you run out.  After discharge, you should have regular check-up appointments with your healthcare provider that is prescribing your Xarelto.  In the future your dose may need to be changed if your kidney function or weight changes by a significant amount.  What do you do if you miss a dose? If you are taking Xarelto ONCE DAILY and you miss a dose, take it as soon as you remember on the same day then continue your regularly scheduled once daily regimen the next day. Do not take two doses of Xarelto at the same time or on the same day.   Important Safety Information A possible side effect of Xarelto is bleeding. You should call your healthcare provider right away if you experience any of the following: ? Bleeding from an injury or your nose that does not stop. ? Unusual colored urine (red or dark brown) or unusual colored stools (red or  black). ? Unusual bruising for unknown reasons. ? A serious fall or if you hit your head (even if there is no bleeding).  Some medicines may interact with Xarelto and might increase your risk of bleeding while on Xarelto. To help avoid this, consult your healthcare provider or pharmacist prior to using any new prescription or non-prescription medications, including herbals, vitamins, non-steroidal anti-inflammatory drugs (NSAIDs) and supplements.  This website has more information on Xarelto: [email or web address]   Additional discharge instructions:  Please get your medications reviewed and adjusted by your Primary MD.  Please request your Primary MD to go over all Hospital Tests and Procedure/Radiological results at the follow up, please get all Hospital records sent to your Prim MD by signing hospital release before you go home.  If you had Pneumonia of Lung problems at the Hospital: Please get a 2 view Chest X ray done in 6-8 weeks after hospital discharge or sooner if instructed by your Primary MD.  If you have Congestive Heart Failure: Please call your Cardiologist or Primary MD anytime you have any of the following symptoms:  1) 3 pound weight gain in 24 hours or 5 pounds in 1 week  2) shortness of breath, with or without a dry hacking cough  3) swelling in the hands, feet or stomach  4) if you have to sleep on extra pillows at night in order to breathe  Follow cardiac low salt diet and 1.5 lit/day fluid restriction.  If you have diabetes Accuchecks 4 times/day, Once  in AM empty stomach and then before each meal. Log in all results and show them to your primary doctor at your next visit. If any glucose reading is under 80 or above 300 call your primary MD immediately.  If you have Seizure/Convulsions/Epilepsy: Please do not drive, operate heavy machinery, participate in activities at heights or participate in high speed sports until you have seen by Primary MD or a Neurologist  and advised to do so again.  If you had Gastrointestinal Bleeding: Please ask your Primary MD to check a complete blood count within one week of discharge or at your next visit. Your endoscopic/colonoscopic biopsies that are pending at the time of discharge, will also need to followed by your Primary MD.  Get Medicines reviewed and adjusted. Please take all your medications with you for your next visit with your Primary MD  Please request your Primary MD to go over all hospital tests and procedure/radiological results at the follow up, please ask your Primary MD to get all Hospital records sent to his/her office.  If you experience worsening of your admission symptoms, develop shortness of breath, life threatening emergency, suicidal or homicidal thoughts you must seek medical attention immediately by calling 911 or calling your MD immediately  if symptoms less severe.  You must read complete instructions/literature along with all the possible adverse reactions/side effects for all the Medicines you take and that have been prescribed to you. Take any new Medicines after you have completely understood and accpet all the possible adverse reactions/side effects.   Do not drive or operate heavy machinery when taking Pain medications.   Do not take more than prescribed Pain, Sleep and Anxiety Medications  Special Instructions: If you have smoked or chewed Tobacco  in the last 2 yrs please stop smoking, stop any regular Alcohol  and or any Recreational drug use.  Wear Seat belts while driving.  Please note You were cared for by a hospitalist during your hospital stay. If you have any questions about your discharge medications or the care you received while you were in the hospital after you are discharged, you can call the unit and asked to speak with the hospitalist on call if the hospitalist that took care of you is not available. Once you are discharged, your primary care physician will handle  any further medical issues. Please note that NO REFILLS for any discharge medications will be authorized once you are discharged, as it is imperative that you return to your primary care physician (or establish a relationship with a primary care physician if you do not have one) for your aftercare needs so that they can reassess your need for medications and monitor your lab values.  You can reach the hospitalist office at phone [phone] or fax [phone]   If you do not have a primary care physician, you can call [phone] for a physician referral.

## 2016-11-20 NOTE — Progress Notes (Signed)
*  PRELIMINARY RESULTS* Vascular Ultrasound Bilateral lower extremity venous duplex has been completed.  Preliminary findings: No evidence of deep vein thrombosis or baker's cysts bilaterally.  Significant amount of pitting edema noted bilaterally.   Everrett Coombe 11/20/2016, 2:29 PM

## 2016-11-20 NOTE — Progress Notes (Signed)
[facility] for Infectious Disease  Date of Admission:  11/17/2016    Total days of antibiotics 3         Day 2 of CEFTRIAXONE         Day 3 of vancomycin         ASSESSMENT: Streptococcus pneumoniae bacteremia. [name] does not has a very convincing evidence of pneumonia on chest x-ray. Is having transudate pleural effusion and culture remained negative. Initial plan was to place a pigtail catheter by IR today, later it was canceled as he might not get benefit from it because of loculation of his pleural effusion. Now the plan is to monitor his pleural effusion with repeated chest x-rays. He is on additional blood culture shows sensitivity to ceftriaxone, azithromycin and levofloxacin. Repeat blood cultures results are still pending. TTE is negative. He does not exhibit any signs of meningitis.  PLAN: 1. Discontinue vancomycin. 2. Continue ceftriaxone. 3. He can be discharged home on cefdinir 300 mg twice daily for total of 14 days. End date 12/01/16.  Principal Problem:   Bacteremia due to Streptococcus pneumoniae Active Problems:   Pneumonia   COPD (chronic obstructive pulmonary disease) (HCC)   Protein-calorie malnutrition, severe (HCC)   Acute respiratory failure with hypoxia (HCC)   Empyema (HCC)   Persistent atrial fibrillation (HCC)   Chronic diastolic CHF (congestive heart failure) (HCC)   Pulmonary HTN (HCC)   History of prostate cancer   Hyponatremia   HCAP (healthcare-associated pneumonia)   Sepsis ([place])   Anasarca   Hypoalbuminemia due to protein-calorie malnutrition (HCC)   Right leg pain   AKI (acute kidney injury) ([place])   Acute urinary retention   . ipratropium  0.5 mg Nebulization TID  . levalbuterol  0.63 mg Nebulization TID  . rivaroxaban  20 mg Oral Q supper  . sodium chloride flush  3 mL Intravenous Q12H  . vitamin C  1,000 mg Oral QODAY    SUBJECTIVE: [name] was lying comfortably in his bed, he was complaining of pain in his  legs. He appears very frustrated because of his current health situation. 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  Review of Systems: ROS  Allergies  Allergen Reactions  . Demerol [Meperidine] Other (See Comments)    UNSPECIFIED REACTION  Causes system to shutdown. ?   . Oxycodone Hcl Other (See Comments)    Pt states this medication 'wires him up' and makes pt hyper; pt does not want to take this ever again    OBJECTIVE: Vitals:   11/20/16 0548 11/20/16 0802 11/20/16 0835 11/20/16 1147  BP: 106/70 108/64  100/68  Pulse: 89 86  87  Resp: (!) 21 20  (!) 21  Temp: 98.1 F (36.7 C) 98.4 F (36.9 C)  98.6 F (37 C)  TempSrc: Oral Oral  Oral  SpO2: 96% 95% 93% 94%  Weight: 176 lb 6.4 oz (80 kg)     Height:       Body mass index is 24.6 kg/m.  Physical Exam  Gen. Well-developed, little emaciated chronically ill-looking elderly man, in no acute distress. Pulmonary. Decreased breath sounds at right base. CV. Irregularly irregular, no tachycardia. Abdominal. Soft, mild diffuse tenderness, nondistended, bowel sounds positive. Extremities.  2+ pitting edema bilaterally.   Lab Results Lab Results  Component Value Date   WBC 23.8 (H) 11/20/2016   HGB 12.1 (L)  11/20/2016   HCT 36.3 (L) 11/20/2016   MCV 90.3 11/20/2016   PLT 394 11/20/2016    Lab Results  Component Value Date   CREATININE 1.00 11/20/2016   BUN 36 (H) 11/20/2016   NA 130 (L) 11/20/2016   K 4.2 11/20/2016   CL 98 (L) 11/20/2016   CO2 25 11/20/2016    Lab Results  Component Value Date   ALT 23 11/20/2016   AST 37 11/20/2016   ALKPHOS 245 (H) 11/20/2016   BILITOT 0.8 11/20/2016     Microbiology: Recent Results (from the past 240 hour(s))  Blood culture (routine x 2)     Status: Abnormal   Collection Time: 11/18/16 12:37 AM  Result Value Ref Range Status   Specimen Description BLOOD RIGHT ANTECUBITAL  Final   Special Requests   Final     BOTTLES DRAWN AEROBIC AND ANAEROBIC Blood Culture adequate volume   Culture  Setup Time   Final    GRAM POSITIVE COCCI IN PAIRS IN CHAINS IN BOTH AEROBIC AND ANAEROBIC BOTTLES CRITICAL RESULT CALLED TO, READ BACK BY AND VERIFIED WITH: Colin Rhein, PHARMD AT 1638 ON 11/18/16 BY C.JESSUP, MLT.    Culture STREPTOCOCCUS PNEUMONIAE (A)  Final   Report Status 11/20/2016 FINAL  Final   Organism ID, Bacteria STREPTOCOCCUS PNEUMONIAE  Final      Susceptibility   Streptococcus pneumoniae - MIC*    ERYTHROMYCIN <=0.12 SENSITIVE Sensitive     LEVOFLOXACIN 0.5 SENSITIVE Sensitive     PENICILLIN (meningitis) 0.5 RESISTANT Resistant     PENICILLIN (non-meningitis) 0.5 SENSITIVE Sensitive     CEFTRIAXONE (non-meningitis) 0.5 SENSITIVE Sensitive     CEFTRIAXONE (meningitis) 0.5 SENSITIVE Sensitive     * STREPTOCOCCUS PNEUMONIAE  Blood Culture ID Panel (Reflexed)     Status: Abnormal   Collection Time: 11/18/16 12:37 AM  Result Value Ref Range Status   Enterococcus species NOT DETECTED NOT DETECTED Final   Listeria monocytogenes NOT DETECTED NOT DETECTED Final   Staphylococcus species NOT DETECTED NOT DETECTED Final   Staphylococcus aureus NOT DETECTED NOT DETECTED Final   Streptococcus species DETECTED (A) NOT DETECTED Final    Comment: CRITICAL RESULT CALLED TO, READ BACK BY AND VERIFIED WITH: M. BELL, RPHARMD AT 1638 ON 11/18/16 BY C. JESSUP, MLT.    Streptococcus agalactiae NOT DETECTED NOT DETECTED Final   Streptococcus pneumoniae DETECTED (A) NOT DETECTED Final    Comment: CRITICAL RESULT CALLED TO, READ BACK BY AND VERIFIED WITH: M. BELL, RPHARMD AT 1638 ON 11/18/16 BY C. JESSUP, MLT.    Streptococcus pyogenes NOT DETECTED NOT DETECTED Final   Acinetobacter baumannii NOT DETECTED NOT DETECTED Final   Enterobacteriaceae species NOT DETECTED NOT DETECTED Final   Enterobacter cloacae complex NOT DETECTED NOT DETECTED Final   Escherichia coli NOT DETECTED NOT DETECTED Final   Klebsiella oxytoca NOT  DETECTED NOT DETECTED Final   Klebsiella pneumoniae NOT DETECTED NOT DETECTED Final   Proteus species NOT DETECTED NOT DETECTED Final   Serratia marcescens NOT DETECTED NOT DETECTED Final   Haemophilus influenzae NOT DETECTED NOT DETECTED Final   Neisseria meningitidis NOT DETECTED NOT DETECTED Final   Pseudomonas aeruginosa NOT DETECTED NOT DETECTED Final   Candida albicans NOT DETECTED NOT DETECTED Final   Candida glabrata NOT DETECTED NOT DETECTED Final   Candida krusei NOT DETECTED NOT DETECTED Final   Candida parapsilosis NOT DETECTED NOT DETECTED Final   Candida tropicalis NOT DETECTED NOT DETECTED Final  Blood culture (routine x 2)  Status: Abnormal   Collection Time: 11/18/16 12:50 AM  Result Value Ref Range Status   Specimen Description BLOOD LEFT FOREARM  Final   Special Requests   Final    BOTTLES DRAWN AEROBIC AND ANAEROBIC Blood Culture adequate volume   Culture  Setup Time   Final    GRAM POSITIVE COCCI IN PAIRS IN CHAINS IN BOTH AEROBIC AND ANAEROBIC BOTTLES CRITICAL VALUE NOTED.  VALUE IS CONSISTENT WITH PREVIOUSLY REPORTED AND CALLED VALUE.    Culture (A)  Final    STREPTOCOCCUS PNEUMONIAE SUSCEPTIBILITIES PERFORMED ON PREVIOUS CULTURE WITHIN THE LAST 5 DAYS.    Report Status 11/20/2016 FINAL  Final  Urine culture     Status: None   Collection Time: 11/18/16  4:33 AM  Result Value Ref Range Status   Specimen Description URINE, CATHETERIZED  Final   Special Requests NONE  Final   Culture NO GROWTH  Final   Report Status 11/19/2016 FINAL  Final  Respiratory Panel by PCR     Status: None   Collection Time: 11/18/16  4:35 AM  Result Value Ref Range Status   Adenovirus NOT DETECTED NOT DETECTED Final   Coronavirus 229E NOT DETECTED NOT DETECTED Final   Coronavirus HKU1 NOT DETECTED NOT DETECTED Final   Coronavirus NL63 NOT DETECTED NOT DETECTED Final   Coronavirus OC43 NOT DETECTED NOT DETECTED Final   Metapneumovirus NOT DETECTED NOT DETECTED Final    Rhinovirus / Enterovirus NOT DETECTED NOT DETECTED Final   Influenza A NOT DETECTED NOT DETECTED Final   Influenza B NOT DETECTED NOT DETECTED Final   Parainfluenza Virus 1 NOT DETECTED NOT DETECTED Final   Parainfluenza Virus 2 NOT DETECTED NOT DETECTED Final   Parainfluenza Virus 3 NOT DETECTED NOT DETECTED Final   Parainfluenza Virus 4 NOT DETECTED NOT DETECTED Final   Respiratory Syncytial Virus NOT DETECTED NOT DETECTED Final   Bordetella pertussis NOT DETECTED NOT DETECTED Final   Chlamydophila pneumoniae NOT DETECTED NOT DETECTED Final   Mycoplasma pneumoniae NOT DETECTED NOT DETECTED Final  Body fluid culture     Status: None (Preliminary result)   Collection Time: 11/18/16  4:01 PM  Result Value Ref Range Status   Specimen Description PLEURAL RIGHT  Final   Special Requests NONE  Final   Gram Stain   Final    ABUNDANT WBC PRESENT, PREDOMINANTLY MONONUCLEAR NO ORGANISMS SEEN    Culture NO GROWTH 2 DAYS  Final   Report Status PENDING  Incomplete    Lorella Nimrod, [place] [facility] Infectious [facility] [phone] pager   [phone] cell 11/20/2016, 1:12 PM

## 2016-11-20 NOTE — Plan of Care (Signed)
Problem: Safety: Goal: Ability to remain free from injury will improve Outcome: [place] protocol

## 2016-11-20 NOTE — Progress Notes (Signed)
PROGRESS NOTE   [name]  TMH:[id number]    DOB: [birth year]    DOA: 11/17/2016  PCP: Flossie Buffy, NP   I have briefly reviewed patients previous medical records in [facility].  Brief Narrative:  71 year old male with a PMH of chronic diastolic CHF (last echo 01/12/10: LVEF 65-70 percent, Dr. 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 Shirlyn Goltz, urology), previous empyema status post right VATS in January 2018, recent fall 3 weeks PTA, anxiety, presented with complaints of progressively worsening bilateral lower extremity swelling, right >left, not relieved by increased dose of Lasix, also associated pain. Initially admitted for evaluation and management of lower extremity swelling and possible HCAP. Subsequent CT chest showed enlarging pleural effusion. Now has pneumococcal bacteremia of unclear source. ID and TCTS consulting.   Assessment & Plan:   Principal Problem:   Bacteremia due to Streptococcus pneumoniae Active Problems:   COPD (chronic obstructive pulmonary disease) (HCC)   Protein-calorie malnutrition, severe (HCC)   Acute respiratory failure with hypoxia (HCC)   Empyema (HCC)   Persistent atrial fibrillation (HCC)   Chronic diastolic CHF (congestive heart failure) (HCC)   Pulmonary HTN (HCC)   History of prostate cancer   Hyponatremia   Pneumonia   HCAP (healthcare-associated pneumonia)   Sepsis ([place])   Anasarca   Hypoalbuminemia due to protein-calorie malnutrition (HCC)   Right leg pain   AKI (acute kidney injury) ([place])   Acute urinary retention   1. Sepsis due to pneumococcal bacteremia: Met sepsis criteria on admission. Initially was on IV cefepime and vancomycin. ID consultation and follow-up appreciated. Discussed with Dr. Megan Salon who is not entirely certain that patient has pneumonia. His right-sided pleural effusion has enlarged but this transudative and does not  appear infected. He recommends completing 14 day course of antibiotics with oral cefdinir at discharge (300 mg twice a day with end date of 12/01/16). Currently on IV ceftriaxone. Vancomycin discontinued. RSV panel negative. Urine streptococcal antigen positive. Urine legionella antigen negative. As per discussion with Dr. Megan Salon, no TEE needed. Surveillance blood cultures from 7/11: Pending. 2. Loculated pleural effusion: TCTS consultation appreciated and recommended IR evaluation for CT-guided pigtail catheter. As per IR, pleural effusion is loculated and pigtail catheter may not help. Thereby plans to place pigtail catheter canceled and periodically follow chest x-rays. 3. Anasarca/lower extremity edema/chronic diastolic CHF/hypoalbuminemia: BNP 433.1. 2-D echo unremarkable and results as below. Lower extremity venous Dopplers negative for DVT. CT of the right leg without acute findings. TSH 1.662. Bilateral lower extremity TED hose. Start IV Lasix as blood pressure tolerates. 4. Right lower extremity pain: Unclear etiology.? Musculoskeletal related to recent fall 2 weeks ago. Workup as above essentially unremarkable. Supportive treatment. PT evaluation. 5. Hyponatremia: Suspect due to hypervolemia. Improving. Start diuretics and follow BMP. 6. 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 CT chest showed calcified hilar and mediastinal lymph nodes/granuloma. This will need close outpatient follow-up and management. Outpatient follow-up with urology and may need oncology consultation. 7. Acute kidney injury: Resolved. 8. Chronic A. fib: Continue Xarelto. Controlled ventricular rate. 9. Acute urinary retention:? Related to prostate cancer. Currently has Foley catheter. Voiding trial in the next 1 or 2 days. Outpatient follow-up with Dr. Jeffie Pollock (had appointment on 7/2 which is to be postponed) 10. Leukocytosis: May be related to ongoing infection. Follow  CBCs. 11. Hypoalbuminemia:? Nutritional. Probably a major contributor to anasarca. Dietitian consultation.  DVT prophylaxis: On Xarelto Code Status: Full Family Communication: Discussed in detail with patient's spouse at bedside. Disposition: To be determined pending PT evaluation. Also await clinical improvement.   Consultants:  Infectious disease Interventional radiology TCTS   Procedures:   Right Thoracentesis on 11/18/16 yeilding 20 cc;   2-D echo 11/19/16: Study Conclusions  - Left ventricle: The cavity size was normal. Wall thickness was   normal. Systolic function was normal. The estimated ejection   fraction was in the range of 55% to 60%. Wall motion was normal;   there were no regional wall motion abnormalities. - Mitral valve: Calcified annulus. - Left atrium: The atrium was moderately dilated. - Right ventricle: The cavity size was at least mildly dilated. - Right atrium: The atrium was moderately to severely dilated. - Tricuspid valve: There was moderate regurgitation directed   eccentrically. - Pulmonary arteries: Systolic pressure was mildly to moderately   increased. PA peak pressure: 47 mm Hg (S). - Pericardium, extracardiac: There was a left pleural effusion.   Foley catheter  Antimicrobials:  IV cefepime 7/8-7/10 IV ceftriaxone 7/10 > IV Zosyn 1 dose IV vancomycin 7/8-7/11    Subjective: Continues to complain of right lower extremity pain, worse on movements. Reports fall 3 weeks ago but his right lower extremity symptoms only started a couple days prior to admission.   ROS: Denies dyspnea, chest pain or palpitations.  Objective:  Vitals:   11/20/16 0802 11/20/16 0835 11/20/16 1147 11/20/16 1533  BP: 108/64  100/68   Pulse: 86  87   Resp: 20  (!) 21   Temp: 98.4 F (36.9 C)  98.6 F (37 C)   TempSrc: Oral  Oral   SpO2: 95% 93% 94% 92%  Weight:      Height:        Examination:  General exam: Pleasant elderly male lying comfortably  supine in bed. Respiratory system: Clear to auscultation. Respiratory effort normal. Cardiovascular system: S1 & S2 heard, irregularly irregular No JVD, murmurs, rubs, gallops or clicks. 2+ pitting bilateral leg edema, right >left. Telemetry: A. fib with controlled ventricular rate. Gastrointestinal system: Abdomen is nondistended, soft and nontender. No organomegaly or masses felt. Normal bowel sounds heard. Central nervous system: Alert and oriented. No focal neurological deficits. Extremities: Symmetric 5 x 5 power. Bilateral diffuse edema without acute findings to suggest infection or any other acute findings. Skin: No rashes, lesions or ulcers Psychiatry: Judgement and insight appear normal. Mood & affect appropriate.     Data Reviewed: I have personally reviewed following labs and imaging studies  CBC:  Recent Labs Lab 11/17/16 2012 11/18/16 0040 11/18/16 0453 11/19/16 0348 11/20/16 0547  WBC 25.3*  --  23.9* 27.9* 23.8*  NEUTROABS  --  28.2*  --  25.7* 20.5*  HGB 12.7*  --  11.5* 11.6* 12.1*  HCT 38.8*  --  35.4* 35.0* 36.3*  MCV 90.7  --  91.2 91.6 90.3  PLT 409*  --  381 336 381   Basic Metabolic Panel:  Recent Labs Lab 11/17/16 2012 11/18/16 0453 11/19/16 0348 11/20/16 0547  NA 123* 126* 128* 130*  K 4.8 5.5* 4.5 4.2  CL 89* 95* 99* 98*  CO2 24 22 19* 25  GLUCOSE 108* 111* 104* 94  BUN 40* 39* 41* 36*  CREATININE 1.48* 1.08 1.15 1.00  CALCIUM 8.3* 7.7* 8.0* 8.3*  MG  --   --  1.7 1.9  PHOS  --   --  3.8 3.6   Liver Function  Tests:  Recent Labs Lab 11/18/16 0040 11/18/16 0453 11/19/16 0348 11/20/16 0547  AST 35 41 26 37  ALT 21 16* 16* 23  ALKPHOS 241* 214* 181* 245*  BILITOT 1.1 1.8* 0.8 0.8  PROT 6.5 5.4* 5.3* 6.2*  ALBUMIN 1.7* 1.4* 1.4* 1.4*   Coagulation Profile:  Recent Labs Lab 11/18/16 1241 11/18/16 1830  INR 3.61 3.68   CBG:  Recent Labs Lab 11/19/16 1150 11/19/16 1530 11/19/16 2211 11/20/16 0800 11/20/16 1146  GLUCAP  145* 89 120* 74 82    Recent Results (from the past 240 hour(s))  Blood culture (routine x 2)     Status: Abnormal   Collection Time: 11/18/16 12:37 AM  Result Value Ref Range Status   Specimen Description BLOOD RIGHT ANTECUBITAL  Final   Special Requests   Final    BOTTLES DRAWN AEROBIC AND ANAEROBIC Blood Culture adequate volume   Culture  Setup Time   Final    GRAM POSITIVE COCCI IN PAIRS IN CHAINS IN BOTH AEROBIC AND ANAEROBIC BOTTLES CRITICAL RESULT CALLED TO, READ BACK BY AND VERIFIED WITH: Colin Rhein, PHARMD AT 4944 ON 11/18/16 BY C.JESSUP, MLT.    Culture STREPTOCOCCUS PNEUMONIAE (A)  Final   Report Status 11/20/2016 FINAL  Final   Organism ID, Bacteria STREPTOCOCCUS PNEUMONIAE  Final      Susceptibility   Streptococcus pneumoniae - MIC*    ERYTHROMYCIN <=0.12 SENSITIVE Sensitive     LEVOFLOXACIN 0.5 SENSITIVE Sensitive     PENICILLIN (meningitis) 0.5 RESISTANT Resistant     PENICILLIN (non-meningitis) 0.5 SENSITIVE Sensitive     CEFTRIAXONE (non-meningitis) 0.5 SENSITIVE Sensitive     CEFTRIAXONE (meningitis) 0.5 SENSITIVE Sensitive     * STREPTOCOCCUS PNEUMONIAE  Blood Culture ID Panel (Reflexed)     Status: Abnormal   Collection Time: 11/18/16 12:37 AM  Result Value Ref Range Status   Enterococcus species NOT DETECTED NOT DETECTED Final   Listeria monocytogenes NOT DETECTED NOT DETECTED Final   Staphylococcus species NOT DETECTED NOT DETECTED Final   Staphylococcus aureus NOT DETECTED NOT DETECTED Final   Streptococcus species DETECTED (A) NOT DETECTED Final    Comment: CRITICAL RESULT CALLED TO, READ BACK BY AND VERIFIED WITH: M. BELL, RPHARMD AT 1638 ON 11/18/16 BY C. JESSUP, MLT.    Streptococcus agalactiae NOT DETECTED NOT DETECTED Final   Streptococcus pneumoniae DETECTED (A) NOT DETECTED Final    Comment: CRITICAL RESULT CALLED TO, READ BACK BY AND VERIFIED WITH: M. BELL, RPHARMD AT 1638 ON 11/18/16 BY C. JESSUP, MLT.    Streptococcus pyogenes NOT DETECTED NOT  DETECTED Final   Acinetobacter baumannii NOT DETECTED NOT DETECTED Final   Enterobacteriaceae species NOT DETECTED NOT DETECTED Final   Enterobacter cloacae complex NOT DETECTED NOT DETECTED Final   Escherichia coli NOT DETECTED NOT DETECTED Final   Klebsiella oxytoca NOT DETECTED NOT DETECTED Final   Klebsiella pneumoniae NOT DETECTED NOT DETECTED Final   Proteus species NOT DETECTED NOT DETECTED Final   Serratia marcescens NOT DETECTED NOT DETECTED Final   Haemophilus influenzae NOT DETECTED NOT DETECTED Final   Neisseria meningitidis NOT DETECTED NOT DETECTED Final   Pseudomonas aeruginosa NOT DETECTED NOT DETECTED Final   Candida albicans NOT DETECTED NOT DETECTED Final   Candida glabrata NOT DETECTED NOT DETECTED Final   Candida krusei NOT DETECTED NOT DETECTED Final   Candida parapsilosis NOT DETECTED NOT DETECTED Final   Candida tropicalis NOT DETECTED NOT DETECTED Final  Blood culture (routine x 2)     Status:  Abnormal   Collection Time: 11/18/16 12:50 AM  Result Value Ref Range Status   Specimen Description BLOOD LEFT FOREARM  Final   Special Requests   Final    BOTTLES DRAWN AEROBIC AND ANAEROBIC Blood Culture adequate volume   Culture  Setup Time   Final    GRAM POSITIVE COCCI IN PAIRS IN CHAINS IN BOTH AEROBIC AND ANAEROBIC BOTTLES CRITICAL VALUE NOTED.  VALUE IS CONSISTENT WITH PREVIOUSLY REPORTED AND CALLED VALUE.    Culture (A)  Final    STREPTOCOCCUS PNEUMONIAE SUSCEPTIBILITIES PERFORMED ON PREVIOUS CULTURE WITHIN THE LAST 5 DAYS.    Report Status 11/20/2016 FINAL  Final  Urine culture     Status: None   Collection Time: 11/18/16  4:33 AM  Result Value Ref Range Status   Specimen Description URINE, CATHETERIZED  Final   Special Requests NONE  Final   Culture NO GROWTH  Final   Report Status 11/19/2016 FINAL  Final  Respiratory Panel by PCR     Status: None   Collection Time: 11/18/16  4:35 AM  Result Value Ref Range Status   Adenovirus NOT DETECTED NOT  DETECTED Final   Coronavirus 229E NOT DETECTED NOT DETECTED Final   Coronavirus HKU1 NOT DETECTED NOT DETECTED Final   Coronavirus NL63 NOT DETECTED NOT DETECTED Final   Coronavirus OC43 NOT DETECTED NOT DETECTED Final   Metapneumovirus NOT DETECTED NOT DETECTED Final   Rhinovirus / Enterovirus NOT DETECTED NOT DETECTED Final   Influenza A NOT DETECTED NOT DETECTED Final   Influenza B NOT DETECTED NOT DETECTED Final   Parainfluenza Virus 1 NOT DETECTED NOT DETECTED Final   Parainfluenza Virus 2 NOT DETECTED NOT DETECTED Final   Parainfluenza Virus 3 NOT DETECTED NOT DETECTED Final   Parainfluenza Virus 4 NOT DETECTED NOT DETECTED Final   Respiratory Syncytial Virus NOT DETECTED NOT DETECTED Final   Bordetella pertussis NOT DETECTED NOT DETECTED Final   Chlamydophila pneumoniae NOT DETECTED NOT DETECTED Final   Mycoplasma pneumoniae NOT DETECTED NOT DETECTED Final  Body fluid culture     Status: None (Preliminary result)   Collection Time: 11/18/16  4:01 PM  Result Value Ref Range Status   Specimen Description PLEURAL RIGHT  Final   Special Requests NONE  Final   Gram Stain   Final    ABUNDANT WBC PRESENT, PREDOMINANTLY MONONUCLEAR NO ORGANISMS SEEN    Culture NO GROWTH 2 DAYS  Final   Report Status PENDING  Incomplete         Radiology Studies: Dg Chest 2 View  Result Date: 11/20/2016 CLINICAL DATA:  Pneumonia.  COPD. EXAM: CHEST  2 VIEW COMPARISON:  11/19/2016 FINDINGS: Mild cardiomegaly stable. No significant change in diffuse interstitial infiltrates, likely due to interstitial edema. Pulmonary hyperinflation again seen, consistent with COPD. No evidence of pulmonary consolidation. Small bilateral pleural effusions are again seen tracking into the right major and minor fissures, without significant change. IMPRESSION: Diffuse pulmonary interstitial edema and small bilateral pleural effusions, without significant change. COPD. Electronically Signed   By: Earle Gell M.D.   On:  11/20/2016 08:09   Dg Chest 2 View  Result Date: 11/19/2016 CLINICAL DATA:  Shortness of breath, cough. EXAM: CHEST  2 VIEW COMPARISON:  Radiographs of November 18, 2016. FINDINGS: Stable cardiomediastinal silhouette. No pneumothorax is noted. Small bilateral pleural effusions are noted ; right pleural effusion is probably loculated. Stable diffuse interstitial densities are noted throughout both lungs concerning for edema or inflammation. Stable right upper lobe opacity is  noted concerning for pneumonia or atelectasis or possibly loculated fluid component. IMPRESSION: Stable diffuse lung densities concerning for edema or inflammation with stable bilateral pleural effusions. Electronically Signed   By: Marijo Conception, M.D.   On: 11/19/2016 10:19   Ct Femur Right Wo Contrast  Result Date: 11/20/2016 CLINICAL DATA:  Right leg pain.  Unable to bear weight. EXAM: CT OF THE LOWER RIGHT EXTREMITY WITHOUT CONTRAST TECHNIQUE: Multidetector CT imaging of the right lower extremity was performed according to the standard protocol. COMPARISON:  None. FINDINGS: Bones/Joint/Cartilage There is slight arthritic changes of the right hip joint. There is no fracture or dislocation or bone destruction. There is slight degenerative changes at the right knee joint with a small knee joint effusion. Soft tissues There is diffuse edema in the subcutaneous tissues of the right thigh, nonspecific. There is no focal muscle abnormality. IMPRESSION: 1. No acute bone abnormality. 2. Slight arthritic changes of the right hip and right knee. 3. Small right knee effusion. 4. Diffuse nonspecific subcutaneous edema in the thigh. Electronically Signed   By: Lorriane Shire M.D.   On: 11/20/2016 08:57   Ct Tibia Fibula Right Wo Contrast  Result Date: 11/20/2016 CLINICAL DATA:  Right lower extremity pain. Patient is unable to bear weight. EXAM: CT OF THE LOWER RIGHT EXTREMITY WITHOUT CONTRAST TECHNIQUE: Multidetector CT imaging of the right lower  leg was performed according to the standard protocol. COMPARISON:  None. FINDINGS: Bones/Joint/Cartilage There is no fracture or dislocation or other significant bone abnormality. Small knee joint effusion. No ankle effusion. Soft tissues There is diffuse subcutaneous edema, nonspecific. The muscles appear normal. IMPRESSION: 1. No acute bone abnormality. 2. Small on right knee fusion. 3. Diffuse prominent subcutaneous edema throughout the right lower leg, nonspecific. Electronically Signed   By: Lorriane Shire M.D.   On: 11/20/2016 08:59        Scheduled Meds: . ipratropium  0.5 mg Nebulization TID  . levalbuterol  0.63 mg Nebulization TID  . rivaroxaban  20 mg Oral Q supper  . sodium chloride flush  3 mL Intravenous Q12H  . vitamin C  1,000 mg Oral QODAY   Continuous Infusions: . sodium chloride    . cefTRIAXone (ROCEPHIN)  IV Stopped (11/20/16 1630)  . vancomycin Stopped (11/20/16 1043)     LOS: 2 days     Idella Lamontagne, MD, FACP, FHM. [facility] Pager [phone]  If 7PM-7AM, please contact night-coverage [email or web address] Password [facility] 11/20/2016, 4:46 PM

## 2016-11-20 NOTE — Progress Notes (Signed)
Ascencion Dike PA-C informed me that Dr. Reesa Chew thinks that because of loculation, pigtail will likely not function properly. I then spoke with Dr. Prescott Gum, and we will cancel pigtail catheter for now. We will obtain chest x ray every other day to monitor and will restart Xarelto.

## 2016-11-21 ENCOUNTER — Inpatient Hospital Stay (HOSPITAL_COMMUNITY): Payer: Medicare Other

## 2016-11-21 DIAGNOSIS — B954 Other streptococcus as the cause of diseases classified elsewhere: Secondary | ICD-10-CM

## 2016-11-21 DIAGNOSIS — I5033 Acute on chronic diastolic (congestive) heart failure: Secondary | ICD-10-CM

## 2016-11-21 LAB — CBC WITH DIFFERENTIAL/PLATELET
Basophils Absolute: 0 10*3/uL (ref 0.0–0.1)
Basophils Relative: 0 %
Eosinophils Absolute: 0.1 10*3/uL (ref 0.0–0.7)
Eosinophils Relative: 1 %
HEMATOCRIT: 32.6 % — AB (ref 39.0–52.0)
HEMOGLOBIN: 10.6 g/dL — AB (ref 13.0–17.0)
LYMPHS ABS: 1.2 10*3/uL (ref 0.7–4.0)
LYMPHS PCT: 8 %
MCH: 29.2 pg (ref 26.0–34.0)
MCHC: 32.5 g/dL (ref 30.0–36.0)
MCV: 89.8 fL (ref 78.0–100.0)
Monocytes Absolute: 2.2 10*3/uL — ABNORMAL HIGH (ref 0.1–1.0)
Monocytes Relative: 14 %
NEUTROS PCT: 77 %
Neutro Abs: 12.4 10*3/uL — ABNORMAL HIGH (ref 1.7–7.7)
Platelets: 391 10*3/uL (ref 150–400)
RBC: 3.63 MIL/uL — AB (ref 4.22–5.81)
RDW: 14.7 % (ref 11.5–15.5)
WBC: 15.9 10*3/uL — AB (ref 4.0–10.5)

## 2016-11-21 LAB — BASIC METABOLIC PANEL
Anion gap: 8 (ref 5–15)
BUN: 34 mg/dL — AB (ref 6–20)
CHLORIDE: 97 mmol/L — AB (ref 101–111)
CO2: 24 mmol/L (ref 22–32)
Calcium: 8.1 mg/dL — ABNORMAL LOW (ref 8.9–10.3)
Creatinine, Ser: 1.16 mg/dL (ref 0.61–1.24)
GFR calc non Af Amer: >60 mL/min (ref >60)
Glucose, Bld: 103 mg/dL — ABNORMAL HIGH (ref 65–99)
POTASSIUM: 4.1 mmol/L (ref 3.5–5.1)
SODIUM: 129 mmol/L — AB (ref 135–145)

## 2016-11-21 LAB — GLUCOSE, CAPILLARY: Glucose-Capillary: 84 mg/dL (ref 65–99)

## 2016-11-21 LAB — CHOLESTEROL, BODY FLUID: Cholesterol, Fluid: 15 mg/dL

## 2016-11-21 LAB — PROCALCITONIN: Procalcitonin: 1.28 ng/mL

## 2016-11-21 MED ORDER — ENSURE ENLIVE PO LIQD
237.0000 mL | Freq: Three times a day (TID) | ORAL | Status: DC
  Administered 2016-11-21 – 2016-11-24 (×8): 237 mL via ORAL

## 2016-11-21 MED ORDER — FUROSEMIDE 10 MG/ML IJ SOLN
40.0000 mg | Freq: Two times a day (BID) | INTRAMUSCULAR | Status: AC
  Administered 2016-11-21 – 2016-11-22 (×2): 40 mg via INTRAVENOUS
  Filled 2016-11-21 (×2): qty 4

## 2016-11-21 MED ORDER — BISACODYL 5 MG PO TBEC
10.0000 mg | DELAYED_RELEASE_TABLET | Freq: Once | ORAL | Status: AC
  Administered 2016-11-21: 10 mg via ORAL
  Filled 2016-11-21: qty 2

## 2016-11-21 MED ORDER — SALINE SPRAY 0.65 % NA SOLN
1.0000 | NASAL | Status: DC | PRN
Start: 2016-11-21 — End: 2016-11-24
  Administered 2016-11-21: 1 via NASAL
  Filled 2016-11-21: qty 44

## 2016-11-21 MED ORDER — LORAZEPAM 1 MG PO TABS
1.0000 mg | ORAL_TABLET | Freq: Once | ORAL | Status: AC
  Administered 2016-11-21: 1 mg via ORAL
  Filled 2016-11-21: qty 1

## 2016-11-21 MED ORDER — AMOXICILLIN 500 MG PO CAPS
500.0000 mg | ORAL_CAPSULE | Freq: Three times a day (TID) | ORAL | Status: DC
  Administered 2016-11-21 – 2016-11-24 (×10): 500 mg via ORAL
  Filled 2016-11-21 (×11): qty 1

## 2016-11-21 NOTE — Progress Notes (Signed)
PROGRESS NOTE   [name]  PIR:[id number]    DOB: Sep 21, 1945    DOA: 11/17/2016  PCP: Flossie Buffy, NP   I have briefly reviewed patients previous medical records in [facility].  Brief Narrative:  71 year old male with a PMH of chronic diastolic CHF (last echo 10/14/99: LVEF 65-70 percent, Dr. 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 Shirlyn Goltz, urology), previous empyema status post right VATS in January 2018, recent fall 3 weeks PTA, anxiety, presented with complaints of progressively worsening bilateral lower extremity swelling, right >left, not relieved by increased dose of Lasix, also associated pain. Initially admitted for evaluation and management of lower extremity swelling and possible HCAP. Subsequent CT chest showed enlarging pleural effusion. Now has pneumococcal bacteremia of unclear source. ID and TCTS consulting.   Assessment & Plan:   Principal Problem:   Bacteremia due to Streptococcus pneumoniae Active Problems:   COPD (chronic obstructive pulmonary disease) (HCC)   Protein-calorie malnutrition, severe (HCC)   Acute respiratory failure with hypoxia (HCC)   Empyema (HCC)   Persistent atrial fibrillation (HCC)   Chronic diastolic CHF (congestive heart failure) (HCC)   Pulmonary HTN (HCC)   History of prostate cancer   Hyponatremia   Pneumonia   HCAP (healthcare-associated pneumonia)   Sepsis ([place])   Anasarca   Hypoalbuminemia due to protein-calorie malnutrition (HCC)   Right leg pain   AKI (acute kidney injury) ([place])   Acute urinary retention   1. Sepsis due to pneumococcal bacteremia: Met sepsis criteria on admission. Initially was on IV cefepime and vancomycin, then changed to IV ceftriaxone followed finally by oral amoxicillin as per ID recommendations. Discussed with Dr. Megan Salon on 7/11 who is not entirely certain that patient has pneumonia. His right-sided  pleural effusion has enlarged but this is transudative and does not appear infected. He recommended completing 14 day course of antibiotics with oral amoxicillin through 12/01/16. RSV panel negative. Urine streptococcal antigen positive. Urine legionella antigen negative. No TEE indicated. Surveillance blood cultures from 7/11: Negative to date 2. Loculated pleural effusion: TCTS consultation appreciated and recommended IR evaluation for CT-guided pigtail catheter. As per IR, pleural effusion is loculated and pigtail catheter may not help. Thereby plans to place pigtail catheter canceled and periodically follow chest x-rays. 3. Anasarca/lower extremity edema/acute on chronic diastolic CHF/hypoalbuminemia: BNP 433.1. 2-D echo unremarkable and results as below. Lower extremity venous Dopplers negative for DVT. CT of the right leg without acute findings. TSH 1.662. Bilateral lower extremity TED hose. Started IV Lasix 40 mg every 12 hours. -1.7 L since admission. Edema slowly improving but has significant volume overload. Continue current dose of Lasix with close monitoring of BMP. 4. Right lower extremity pain: Unclear etiology.? Musculoskeletal related to recent fall 2 weeks ago. Workup as above essentially unremarkable. Supportive treatment. Improving. PT recommends home health PT at discharge. 5. Hyponatremia: Suspect due to hypervolemia. Improving. Start diuretics and follow BMP. Stable sodium. 6. 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 CT chest showed calcified hilar and mediastinal lymph nodes/granuloma. This will need close outpatient follow-up and management. Outpatient follow-up with urology and may need oncology consultation. Patient and family indicate that they have urology appointment Monday of next week. 7. Acute kidney injury: Resolved. 8. Chronic A. fib: Continue Xarelto. Controlled ventricular rate. 9. Acute urinary retention:? Related to prostate  cancer. Currently has Foley catheter. Voiding trial in the next 1 or 2  days. Outpatient follow-up with Dr. Jeffie Pollock (had appointment on 7/16 10. Leukocytosis: May be related to ongoing infection. Improving. Follow CBCs. 11. Hypoalbuminemia:? Nutritional. Probably a major contributor to anasarca. Dietitian consultation. 12. Anemia: No bleeding reported. Follow CBC in a.m.   DVT prophylaxis: On Xarelto Code Status: Full Family Communication: Discussed in detail with patient's spouse at bedside. Disposition: DC home when medically improved, with home health PT   Consultants:  Infectious disease Interventional radiology TCTS   Procedures:   Right Thoracentesis on 11/18/16 yeilding 20 cc;   2-D echo 11/19/16: Study Conclusions  - Left ventricle: The cavity size was normal. Wall thickness was   normal. Systolic function was normal. The estimated ejection   fraction was in the range of 55% to 60%. Wall motion was normal;   there were no regional wall motion abnormalities. - Mitral valve: Calcified annulus. - Left atrium: The atrium was moderately dilated. - Right ventricle: The cavity size was at least mildly dilated. - Right atrium: The atrium was moderately to severely dilated. - Tricuspid valve: There was moderate regurgitation directed   eccentrically. - Pulmonary arteries: Systolic pressure was mildly to moderately   increased. PA peak pressure: 47 mm Hg (S). - Pericardium, extracardiac: There was a left pleural effusion.   Foley catheter  Antimicrobials:  IV cefepime 7/8-7/10 IV ceftriaxone 7/10 > discontinued  IV Zosyn 1 dose IV vancomycin 7/8-7/11  oral amoxicillin 7/12 >   Subjective: Overall feels better. Lower extremity swelling and right lower extremity pain have improved.   ROS: Denies dyspnea, chest pain or palpitations.  Objective:  Vitals:   11/21/16 0731 11/21/16 0900 11/21/16 0915 11/21/16 1100  BP: 102/60     Pulse: 86 86    Resp: 18 (!) 26      Temp:  98.6 F (37 C)  98.1 F (36.7 C)  TempSrc:  Oral    SpO2: 100% 94% (!) 88% 98%  Weight:      Height:        Examination:  General exam: Pleasant elderly male lying comfortably supine in bed. Respiratory system: Occasional basal crackles but otherwise clear to auscultation. Respiratory effort normal. Cardiovascular system: S1 & S2 heard, irregularly irregular No JVD, murmurs, rubs, gallops or clicks. 2+ pitting bilateral leg edema, right >left, better than yesterday. Telemetry: A. fib with controlled ventricular rate-personally reviewed and no change noted . Gastrointestinal system: Abdomen is nondistended, soft and nontender. No organomegaly or masses felt. Normal bowel sounds heard. Foley catheter + draining straw-colored urine.  Central nervous system: Alert and oriented. No focal neurological deficits. stable.  Extremities: Symmetric 5 x 5 power. Bilateral diffuse edema without acute findings to suggest infection or any other acute findings.edema is better and now mostly in the lower legs.  Skin: No rashes, lesions or ulcers Psychiatry: Judgement and insight appear normal. Mood & affect appropriate.     Data Reviewed: I have personally reviewed following labs and imaging studies  CBC:  Recent Labs Lab 11/17/16 2012 11/18/16 0040 11/18/16 0453 11/19/16 0348 11/20/16 0547 11/21/16 0451  WBC 25.3*  --  23.9* 27.9* 23.8* 15.9*  NEUTROABS  --  28.2*  --  25.7* 20.5* 12.4*  HGB 12.7*  --  11.5* 11.6* 12.1* 10.6*  HCT 38.8*  --  35.4* 35.0* 36.3* 32.6*  MCV 90.7  --  91.2 91.6 90.3 89.8  PLT 409*  --  381 336 394 947   Basic Metabolic Panel:  Recent Labs Lab 11/17/16 2012 11/18/16 0453 11/19/16 0348  11/20/16 0547 11/21/16 0451  NA 123* 126* 128* 130* 129*  K 4.8 5.5* 4.5 4.2 4.1  CL 89* 95* 99* 98* 97*  CO2 24 22 19* 25 24  GLUCOSE 108* 111* 104* 94 103*  BUN 40* 39* 41* 36* 34*  CREATININE 1.48* 1.08 1.15 1.00 1.16  CALCIUM 8.3* 7.7* 8.0* 8.3* 8.1*  MG   --   --  1.7 1.9  --   PHOS  --   --  3.8 3.6  --    Liver Function Tests:  Recent Labs Lab 11/18/16 0040 11/18/16 0453 11/19/16 0348 11/20/16 0547  AST 35 41 26 37  ALT 21 16* 16* 23  ALKPHOS 241* 214* 181* 245*  BILITOT 1.1 1.8* 0.8 0.8  PROT 6.5 5.4* 5.3* 6.2*  ALBUMIN 1.7* 1.4* 1.4* 1.4*   Coagulation Profile:  Recent Labs Lab 11/18/16 1241 11/18/16 1830  INR 3.61 3.68   CBG:  Recent Labs Lab 11/20/16 0800 11/20/16 1146 11/20/16 1657 11/20/16 2116 11/21/16 0748  GLUCAP 74 82 85 138* 84    Recent Results (from the past 240 hour(s))  Blood culture (routine x 2)     Status: Abnormal   Collection Time: 11/18/16 12:37 AM  Result Value Ref Range Status   Specimen Description BLOOD RIGHT ANTECUBITAL  Final   Special Requests   Final    BOTTLES DRAWN AEROBIC AND ANAEROBIC Blood Culture adequate volume   Culture  Setup Time   Final    GRAM POSITIVE COCCI IN PAIRS IN CHAINS IN BOTH AEROBIC AND ANAEROBIC BOTTLES CRITICAL RESULT CALLED TO, READ BACK BY AND VERIFIED WITH: Colin Rhein, PHARMD AT 1638 ON 11/18/16 BY C.JESSUP, MLT.    Culture STREPTOCOCCUS PNEUMONIAE (A)  Final   Report Status 11/20/2016 FINAL  Final   Organism ID, Bacteria STREPTOCOCCUS PNEUMONIAE  Final      Susceptibility   Streptococcus pneumoniae - MIC*    ERYTHROMYCIN <=0.12 SENSITIVE Sensitive     LEVOFLOXACIN 0.5 SENSITIVE Sensitive     PENICILLIN (meningitis) 0.5 RESISTANT Resistant     PENICILLIN (non-meningitis) 0.5 SENSITIVE Sensitive     CEFTRIAXONE (non-meningitis) 0.5 SENSITIVE Sensitive     CEFTRIAXONE (meningitis) 0.5 SENSITIVE Sensitive     * STREPTOCOCCUS PNEUMONIAE  Blood Culture ID Panel (Reflexed)     Status: Abnormal   Collection Time: 11/18/16 12:37 AM  Result Value Ref Range Status   Enterococcus species NOT DETECTED NOT DETECTED Final   Listeria monocytogenes NOT DETECTED NOT DETECTED Final   Staphylococcus species NOT DETECTED NOT DETECTED Final   Staphylococcus aureus NOT  DETECTED NOT DETECTED Final   Streptococcus species DETECTED (A) NOT DETECTED Final    Comment: CRITICAL RESULT CALLED TO, READ BACK BY AND VERIFIED WITH: M. BELL, RPHARMD AT 1638 ON 11/18/16 BY C. JESSUP, MLT.    Streptococcus agalactiae NOT DETECTED NOT DETECTED Final   Streptococcus pneumoniae DETECTED (A) NOT DETECTED Final    Comment: CRITICAL RESULT CALLED TO, READ BACK BY AND VERIFIED WITH: M. BELL, RPHARMD AT 1638 ON 11/18/16 BY C. JESSUP, MLT.    Streptococcus pyogenes NOT DETECTED NOT DETECTED Final   Acinetobacter baumannii NOT DETECTED NOT DETECTED Final   Enterobacteriaceae species NOT DETECTED NOT DETECTED Final   Enterobacter cloacae complex NOT DETECTED NOT DETECTED Final   Escherichia coli NOT DETECTED NOT DETECTED Final   Klebsiella oxytoca NOT DETECTED NOT DETECTED Final   Klebsiella pneumoniae NOT DETECTED NOT DETECTED Final   Proteus species NOT DETECTED NOT DETECTED Final  Serratia marcescens NOT DETECTED NOT DETECTED Final   Haemophilus influenzae NOT DETECTED NOT DETECTED Final   Neisseria meningitidis NOT DETECTED NOT DETECTED Final   Pseudomonas aeruginosa NOT DETECTED NOT DETECTED Final   Candida albicans NOT DETECTED NOT DETECTED Final   Candida glabrata NOT DETECTED NOT DETECTED Final   Candida krusei NOT DETECTED NOT DETECTED Final   Candida parapsilosis NOT DETECTED NOT DETECTED Final   Candida tropicalis NOT DETECTED NOT DETECTED Final  Blood culture (routine x 2)     Status: Abnormal   Collection Time: 11/18/16 12:50 AM  Result Value Ref Range Status   Specimen Description BLOOD LEFT FOREARM  Final   Special Requests   Final    BOTTLES DRAWN AEROBIC AND ANAEROBIC Blood Culture adequate volume   Culture  Setup Time   Final    GRAM POSITIVE COCCI IN PAIRS IN CHAINS IN BOTH AEROBIC AND ANAEROBIC BOTTLES CRITICAL VALUE NOTED.  VALUE IS CONSISTENT WITH PREVIOUSLY REPORTED AND CALLED VALUE.    Culture (A)  Final    STREPTOCOCCUS  PNEUMONIAE SUSCEPTIBILITIES PERFORMED ON PREVIOUS CULTURE WITHIN THE LAST 5 DAYS.    Report Status 11/20/2016 FINAL  Final  Urine culture     Status: None   Collection Time: 11/18/16  4:33 AM  Result Value Ref Range Status   Specimen Description URINE, CATHETERIZED  Final   Special Requests NONE  Final   Culture NO GROWTH  Final   Report Status 11/19/2016 FINAL  Final  Respiratory Panel by PCR     Status: None   Collection Time: 11/18/16  4:35 AM  Result Value Ref Range Status   Adenovirus NOT DETECTED NOT DETECTED Final   Coronavirus 229E NOT DETECTED NOT DETECTED Final   Coronavirus HKU1 NOT DETECTED NOT DETECTED Final   Coronavirus NL63 NOT DETECTED NOT DETECTED Final   Coronavirus OC43 NOT DETECTED NOT DETECTED Final   Metapneumovirus NOT DETECTED NOT DETECTED Final   Rhinovirus / Enterovirus NOT DETECTED NOT DETECTED Final   Influenza A NOT DETECTED NOT DETECTED Final   Influenza B NOT DETECTED NOT DETECTED Final   Parainfluenza Virus 1 NOT DETECTED NOT DETECTED Final   Parainfluenza Virus 2 NOT DETECTED NOT DETECTED Final   Parainfluenza Virus 3 NOT DETECTED NOT DETECTED Final   Parainfluenza Virus 4 NOT DETECTED NOT DETECTED Final   Respiratory Syncytial Virus NOT DETECTED NOT DETECTED Final   Bordetella pertussis NOT DETECTED NOT DETECTED Final   Chlamydophila pneumoniae NOT DETECTED NOT DETECTED Final   Mycoplasma pneumoniae NOT DETECTED NOT DETECTED Final  Body fluid culture     Status: None (Preliminary result)   Collection Time: 11/18/16  4:01 PM  Result Value Ref Range Status   Specimen Description PLEURAL RIGHT  Final   Special Requests NONE  Final   Gram Stain   Final    ABUNDANT WBC PRESENT, PREDOMINANTLY MONONUCLEAR NO ORGANISMS SEEN    Culture NO GROWTH 3 DAYS  Final   Report Status PENDING  Incomplete  Culture, blood (routine x 2)     Status: None (Preliminary result)   Collection Time: 11/20/16  5:47 AM  Result Value Ref Range Status   Specimen  Description BLOOD BLOOD RIGHT HAND  Final   Special Requests   Final    Blood Culture adequate volume BOTTLES DRAWN AEROBIC AND ANAEROBIC   Culture NO GROWTH 1 DAY  Final   Report Status PENDING  Incomplete  Culture, blood (routine x 2)     Status: None (Preliminary  result)   Collection Time: 11/20/16  5:47 AM  Result Value Ref Range Status   Specimen Description BLOOD BLOOD LEFT HAND  Final   Special Requests   Final    Blood Culture adequate volume BOTTLES DRAWN AEROBIC AND ANAEROBIC   Culture NO GROWTH 1 DAY  Final   Report Status PENDING  Incomplete         Radiology Studies: Dg Chest 1 View  Result Date: 11/21/2016 CLINICAL DATA:  Followup pleural effusion and CHF. EXAM: CHEST 1 VIEW COMPARISON:  11/20/2016 FINDINGS: The heart is enlarged but stable. Chronic underlying emphysema and bronchitic changes with suspected persistent superimposed interstitial edema. There is a persistent and slightly larger right pleural effusion along with pseudotumor of fluid in the major fissure. No large left pleural effusion. Persistent bibasilar atelectasis. IMPRESSION: Stable severe underlying lung disease with suspected superimposed interstitial edema/CHF. Persistent and slightly larger right pleural effusion with pseudotumor of fluid in the major fissure. Electronically Signed   By: Marijo Sanes M.D.   On: 11/21/2016 08:22   Dg Chest 2 View  Result Date: 11/20/2016 CLINICAL DATA:  Pneumonia.  COPD. EXAM: CHEST  2 VIEW COMPARISON:  11/19/2016 FINDINGS: Mild cardiomegaly stable. No significant change in diffuse interstitial infiltrates, likely due to interstitial edema. Pulmonary hyperinflation again seen, consistent with COPD. No evidence of pulmonary consolidation. Small bilateral pleural effusions are again seen tracking into the right major and minor fissures, without significant change. IMPRESSION: Diffuse pulmonary interstitial edema and small bilateral pleural effusions, without significant  change. COPD. Electronically Signed   By: Earle Gell M.D.   On: 11/20/2016 08:09   Ct Femur Right Wo Contrast  Result Date: 11/20/2016 CLINICAL DATA:  Right leg pain.  Unable to bear weight. EXAM: CT OF THE LOWER RIGHT EXTREMITY WITHOUT CONTRAST TECHNIQUE: Multidetector CT imaging of the right lower extremity was performed according to the standard protocol. COMPARISON:  None. FINDINGS: Bones/Joint/Cartilage There is slight arthritic changes of the right hip joint. There is no fracture or dislocation or bone destruction. There is slight degenerative changes at the right knee joint with a small knee joint effusion. Soft tissues There is diffuse edema in the subcutaneous tissues of the right thigh, nonspecific. There is no focal muscle abnormality. IMPRESSION: 1. No acute bone abnormality. 2. Slight arthritic changes of the right hip and right knee. 3. Small right knee effusion. 4. Diffuse nonspecific subcutaneous edema in the thigh. Electronically Signed   By: Lorriane Shire M.D.   On: 11/20/2016 08:57   Ct Tibia Fibula Right Wo Contrast  Result Date: 11/20/2016 CLINICAL DATA:  Right lower extremity pain. Patient is unable to bear weight. EXAM: CT OF THE LOWER RIGHT EXTREMITY WITHOUT CONTRAST TECHNIQUE: Multidetector CT imaging of the right lower leg was performed according to the standard protocol. COMPARISON:  None. FINDINGS: Bones/Joint/Cartilage There is no fracture or dislocation or other significant bone abnormality. Small knee joint effusion. No ankle effusion. Soft tissues There is diffuse subcutaneous edema, nonspecific. The muscles appear normal. IMPRESSION: 1. No acute bone abnormality. 2. Small on right knee fusion. 3. Diffuse prominent subcutaneous edema throughout the right lower leg, nonspecific. Electronically Signed   By: Lorriane Shire M.D.   On: 11/20/2016 08:59        Scheduled Meds: . amoxicillin  500 mg Oral Q8H  . ipratropium  0.5 mg Nebulization TID  . levalbuterol  0.63 mg  Nebulization TID  . rivaroxaban  20 mg Oral Q supper  . sodium chloride flush  3 mL Intravenous Q12H  . vitamin C  1,000 mg Oral QODAY   Continuous Infusions: . sodium chloride       LOS: 3 days     Laurianne Floresca, MD, FACP, FHM. [facility] Pager [phone]  If 7PM-7AM, please contact night-coverage [email or web address] Password [email or web address] 11/21/2016, 2:15 PM

## 2016-11-21 NOTE — Evaluation (Signed)
Physical Therapy Evaluation Patient Details Name: [name] MRN: [id number] DOB: 1945/09/01 Today's Date: 11/21/2016   History of Present Illness  The patient is a 71 yo Caucasian male with a PMH of Diastolic CHF (last echo revealed an EF of 65-70% 05/14/16), COPD, Atrial fibrillation on Xarelto, Prostate cancer with possible mets to lymph nodes, previous empyema status post right VATS in Jan 2018 and other comorbids who presented to the Emergency Department complaining of sudden-onset, constant bilateral leg swelling (worse on the right) that began several days ago and progressively worsening. Pt found to have bacteremia due to streptococcus.  Clinical Impression  Pt admitted with above diagnosis. Pt currently with functional limitations due to the deficits listed below (see PT Problem List).  Pt will benefit from skilled PT to increase their independence and safety with mobility to allow discharge to the venue listed below.  Pt fatigues quickly, but did better than he thought he would do. It was difficult to get an o2 sat reading during mobility, but pt wore o2 at 3L/min and educated on pursed lip breathing. Recommend HHPT, RW, and 3-1 BSC.     Follow Up Recommendations Home health PT    Equipment Recommendations  Rolling walker with 5" wheels;3in1 (PT)    Recommendations for Other Services       Precautions / Restrictions Restrictions Weight Bearing Restrictions: No      Mobility  Bed Mobility Overal bed mobility: Needs Assistance Bed Mobility: Supine to Sit     Supine to sit: Supervision;HOB elevated     General bed mobility comments: Increased time with verbal cueing for technique  Transfers Overall transfer level: Needs assistance Equipment used: Rolling walker (2 wheeled) Transfers: Sit to/from Stand Sit to Stand: Min guard         General transfer comment: cues for hand placemen  Ambulation/Gait Ambulation/Gait assistance: Min guard Ambulation Distance  (Feet): 10 Feet (x 2) Assistive device: Rolling walker (2 wheeled) Gait Pattern/deviations: Decreased step length - right;Decreased step length - left;Trunk flexed     General Gait Details: Pt ambulated around bed and then back with rest break in between.  Difficulty getting a consistent wave form with o2 sat, but educated on pursed lip breathing.  Pt with some reports of R LE discomfort with 2nd bout of gait.  Did ambulate a few feet without RW as well.  Stairs            Wheelchair Mobility    Modified Rankin (Stroke Patients Only)       Balance Overall balance assessment: Needs assistance   Sitting balance-Leahy Scale: Good       Standing balance-Leahy Scale: Fair                               Pertinent Vitals/Pain Pain Assessment: Faces Faces Pain Scale: Hurts little more Pain Location: R LE Pain Descriptors / Indicators: Grimacing Pain Intervention(s): Monitored during session;Limited activity within patient's tolerance;Repositioned    Home Living Family/patient expects to be discharged to:: Private residence Living Arrangements: Spouse/significant other Available Help at Discharge: Family Type of Home: House Home Access: Stairs to enter Entrance Stairs-Rails: Left Entrance Stairs-Number of Steps: 5 Home Layout: One level Home Equipment: None      Prior Function Level of Independence: Independent         Comments: Optometrist and works frequently     Journalist, newspaper  Extremity/Trunk Assessment   Upper Extremity Assessment Upper Extremity Assessment: Generalized weakness    Lower Extremity Assessment Lower Extremity Assessment: Generalized weakness;RLE deficits/detail RLE Deficits / Details: Guarded with R LE movement       Communication   Communication: No difficulties  Cognition Arousal/Alertness: Awake/alert Behavior During Therapy: WFL for tasks assessed/performed Overall Cognitive Status: Within Functional  Limits for tasks assessed                                        General Comments General comments (skin integrity, edema, etc.): Pt requires frequent rest breaks    Exercises     Assessment/Plan    PT Assessment Patient needs continued PT services  PT Problem List Decreased strength;Decreased activity tolerance;Decreased balance;Decreased mobility;Decreased knowledge of use of DME;Decreased safety awareness;Pain       PT Treatment Interventions Gait training;Stair training;Functional mobility training;DME instruction;Therapeutic activities;Therapeutic exercise;Balance training;Patient/family education    PT Goals (Current goals can be found in the Care Plan section)  Acute Rehab PT Goals Patient Stated Goal: go home PT Goal Formulation: With patient/family Time For Goal Achievement: 12/05/16 Potential to Achieve Goals: Good    Frequency     Barriers to discharge        Co-evaluation               AM-PAC PT "6 Clicks" Daily Activity  Outcome Measure Difficulty turning over in bed (including adjusting bedclothes, sheets and blankets)?: A Little Difficulty moving from lying on back to sitting on the side of the bed? : A Lot Difficulty sitting down on and standing up from a chair with arms (e.g., wheelchair, bedside commode, etc,.)?: Total Help needed moving to and from a bed to chair (including a wheelchair)?: A Little Help needed walking in hospital room?: A Little Help needed climbing 3-5 steps with a railing? : A Little 6 Click Score: 15    End of Session Equipment Utilized During Treatment: Gait belt;Oxygen Activity Tolerance: Patient limited by fatigue Patient left: in chair;with call bell/phone within reach Nurse Communication: Mobility status PT Visit Diagnosis: Muscle weakness (generalized) (M62.81);Difficulty in walking, not elsewhere classified (R26.2);Pain Pain - Right/Left: Right Pain - part of body: Leg    Time: 0934 (no charge  for time MD/PA in to see pt)-1026 PT Time Calculation (min) (ACUTE ONLY): 52 min   Charges:   PT Evaluation $PT Eval Moderate Complexity: 1 Procedure PT Treatments $Gait Training: 8-22 mins $Therapeutic Activity: 8-22 mins   PT G Codes:        Lamanda Rudder L. Tamala Julian, [place] Pager [phone] 11/21/2016   Galen Manila 11/21/2016, 11:25 AM

## 2016-11-21 NOTE — Progress Notes (Signed)
Nutrition Consult / Follow-up  DOCUMENTATION CODES:   Severe malnutrition in context of chronic illness  INTERVENTION:    Continue Ensure Enlive PO TID, each supplement provides 350 kcal and 20 grams of protein  NUTRITION DIAGNOSIS:   Malnutrition (severe) related to chronic illness (COPD, CHF) as evidenced by severe depletion of body fat, severe depletion of muscle mass, severe fluid accumulation.  Ongoing  GOAL:   Patient will meet greater than or equal to 90% of their needs  Progressing  MONITOR:   PO intake, Supplement acceptance, I & O's  REASON FOR ASSESSMENT:   Consult Assessment of nutrition requirement/status  ASSESSMENT:   71 yo male with PMH of COPD, CHF, LE edema, PNA, DDD, pulmonary HTN who was admitted on 7/8 with LE swelling and HCAP.  Received MD Consult for assessment of nutrition status. Initial nutrition assessment completed 7/10.   Patient reports continued poor oral intake. Wife also in room. Answered her questions regarding appropriate nutrition for patient. Encouraged a more liberalized approach to his diet, given very poor oral intake with ongoing severe malnutrition. Discussed options for nutrition supplements when d/c home. Patient drinks Ensure supplements when he is given them. He drank 2 supplements yesterday. Provided patient with several Ensure coupons.  Labs and medications reviewed.  Diet Order:  Diet Heart Room service appropriate? Yes; Fluid consistency: Thin; Fluid restriction: 1200 mL Fluid  Skin:  Reviewed, no issues  Last BM:  7/8  Height:   Ht Readings from Last 1 Encounters:  11/17/16 _0  (1.803 m)    Weight:   Wt Readings from Last 1 Encounters:  11/21/16 184 lb (83.5 kg)    Ideal Body Weight:  78.2 kg  BMI:  Body mass index is 25.66 kg/m.  Estimated Nutritional Needs:   Kcal:  2000-2200  Protein:  100-120 gm  Fluid:  1.8-2 L  EDUCATION NEEDS:   No education needs identified at this  time  Molli Barrows, [place], [place], [place] Pager [phone] After Hours Pager [phone]

## 2016-11-21 NOTE — Progress Notes (Addendum)
[address]       [place] [address]             [phone]           Subjective: Patient states his breathing today is about the same as it has been previously.  Objective: Vital signs in last 24 hours: Temp:  [97.8 F (36.6 C)-98.6 F (37 C)] 98.6 F (37 C) (07/12 0900) Pulse Rate:  [84-100] 84 (07/12 0451) Cardiac Rhythm: Atrial fibrillation (07/12 0702) Resp:  [14-23] 15 (07/12 0451) BP: (94-100)/(56-69) 95/67 (07/12 0451) SpO2:  [88 %-100 %] 88 % (07/12 0915) Weight:  [83.5 kg (184 lb)] 83.5 kg (184 lb) (07/12 0451)     Intake/Output from previous day: 07/11 0701 - 07/12 0700 In: 500 [P.O.:500] Out: 2400 [Urine:2400]   Physical Exam:  Cardiovascular: IRRR IRRR. Pulmonary: Diminished at left base and some crackles   Lab Results: CBC: Recent Labs  11/20/16 0547 11/21/16 0451  WBC 23.8* 15.9*  HGB 12.1* 10.6*  HCT 36.3* 32.6*  PLT 394 391   BMET:  Recent Labs  11/20/16 0547 11/21/16 0451  NA 130* 129*  K 4.2 4.1  CL 98* 97*  CO2 25 24  GLUCOSE 94 103*  BUN 36* 34*  CREATININE 1.00 1.16  CALCIUM 8.3* 8.1*    PT/INR:  Recent Labs  11/18/16 1830  LABPROT 37.4*  INR 3.68   ABG:  INR: Will add last result for INR, ABG once components are confirmed Will add last 4 CBG results once components are confirmed  Assessment/Plan:  1. CV - Chronic a fib. On Xarelto 20 mg at dinner. 2.  Pulmonary - History of COPD, emphysema, and pulmonary hypertension. On 3 liters of oxygen via [place]. CXR this am shows severe, stable underlying lung disease with CHF, left loculated pleural effusion, and atelectasis. I discussed placement of pigtail with Ascencion Dike PA-C (from IR) yesterday. Dr, Reesa Chew thinks area is loculated and would likely not be of benefit. I then discussed with Dr. Prescott Gum, and he recommended monitoring for now. If he clinically worsens, will have to consider other options. Will re check CXR on Saturday 3. Anemia-H  and H decreased to 10.6 and 32.6 4. Leukocytosis-WBC decreased from 23,800 to 15,900. Likely related to below. 5. ID-continue Rocephin for bacteremia secondary to Strep Pneumoniae 6. 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 Management per primary  ZIMMERMAN,DONIELLE MPA-C 11/21/2016,9:30 AM

## 2016-11-21 NOTE — Progress Notes (Signed)
[facility]  Date of Admission:  11/17/2016    Total days of antibiotics 4         Day 4 of ceftriaxone         ASSESSMENT: Streptococcus pneumoniae bacteremia.[name]'s repeat blood culture results are still pending. He is clinically stable. Cardiothoracic surgery is following for his pleural effusion, advised to get repeated chest x-rays every other day.  PLAN: 1. Continue ceftriaxone during hospital stay he can be discharged home on amoxicillin 500 mg every 8 hourly to complete a total 14 days.End date 12/01/16.  Principal Problem:   Bacteremia due to Streptococcus pneumoniae Active Problems:   Pneumonia   COPD (chronic obstructive pulmonary disease) (HCC)   Protein-calorie malnutrition, severe (HCC)   Acute respiratory failure with hypoxia (HCC)   Empyema (HCC)   Persistent atrial fibrillation (HCC)   Chronic diastolic CHF (congestive heart failure) (HCC)   Pulmonary HTN (HCC)   History of prostate cancer   Hyponatremia   HCAP (healthcare-associated pneumonia)   Sepsis ([facility])   Anasarca   Hypoalbuminemia due to protein-calorie malnutrition (HCC)   Right leg pain   AKI (acute kidney injury) ([facility])   Acute urinary retention   . ipratropium  0.5 mg Nebulization TID  . levalbuterol  0.63 mg Nebulization TID  . rivaroxaban  20 mg Oral Q supper  . sodium chloride flush  3 mL Intravenous Q12H  . vitamin C  1,000 mg Oral QODAY    SUBJECTIVE: [name] was complaining of generalized fatigue, he was accompanied by his wife in the room who states that he look much improved as compared to a few days before. His appetite is still not completely back. He denied any chest pain or dyspnea. According to patient his lower extremity swelling remains the same and he is still having pain in his legs.  Review of Systems: ROS  Allergies  Allergen Reactions  . Demerol [Meperidine] Other (See Comments)    UNSPECIFIED REACTION  Causes system to  shutdown. ?   . Oxycodone Hcl Other (See Comments)    Pt states this medication 'wires him up' and makes pt hyper; pt does not want to take this ever again    OBJECTIVE: Vitals:   11/21/16 0451 11/21/16 0731 11/21/16 0900 11/21/16 0915  BP: 95/67 102/60    Pulse: 84 86 86   Resp: 15 18 (!) 26   Temp: 97.8 F (36.6 C)  98.6 F (37 C)   TempSrc: Oral  Oral   SpO2: 95% 100% 94% (!) 88%  Weight: 184 lb (83.5 kg)     Height:       Body mass index is 25.66 kg/m.  Physical Exam   Gen. Well-developed, chronically ill-looking elderly man, in no acute distress. Pulmonary. Decreased breath sounds at right base. CV. Irregularly irregular, no tachycardia. Abdominal. Soft, mild diffuse tenderness, nondistended, bowel sounds positive. Extremities.  2+ pitting edema bilaterally.  Lab Results Lab Results  Component Value Date   WBC 15.9 (H) 11/21/2016   HGB 10.6 (L) 11/21/2016   HCT 32.6 (L) 11/21/2016   MCV 89.8 11/21/2016   PLT 391 11/21/2016    Lab Results  Component Value Date   CREATININE 1.16 11/21/2016   BUN 34 (H) 11/21/2016   NA 129 (L) 11/21/2016   K 4.1 11/21/2016   CL 97 (L) 11/21/2016   CO2 24 11/21/2016    Lab Results  Component Value Date   ALT  23 11/20/2016   AST 37 11/20/2016   ALKPHOS 245 (H) 11/20/2016   BILITOT 0.8 11/20/2016     Microbiology: Recent Results (from the past 240 hour(s))  Blood culture (routine x 2)     Status: Abnormal   Collection Time: 11/18/16 12:37 AM  Result Value Ref Range Status   Specimen Description BLOOD RIGHT ANTECUBITAL  Final   Special Requests   Final    BOTTLES DRAWN AEROBIC AND ANAEROBIC Blood Culture adequate volume   Culture  Setup Time   Final    GRAM POSITIVE COCCI IN PAIRS IN CHAINS IN BOTH AEROBIC AND ANAEROBIC BOTTLES CRITICAL RESULT CALLED TO, READ BACK BY AND VERIFIED WITH: [name], PHARMD AT 8242 ON 11/18/16 BY C.JESSUP, MLT.    Culture STREPTOCOCCUS PNEUMONIAE (A)  Final   Report Status 11/20/2016 FINAL   Final   Organism ID, Bacteria STREPTOCOCCUS PNEUMONIAE  Final      Susceptibility   Streptococcus pneumoniae - MIC*    ERYTHROMYCIN <=0.12 SENSITIVE Sensitive     LEVOFLOXACIN 0.5 SENSITIVE Sensitive     PENICILLIN (meningitis) 0.5 RESISTANT Resistant     PENICILLIN (non-meningitis) 0.5 SENSITIVE Sensitive     CEFTRIAXONE (non-meningitis) 0.5 SENSITIVE Sensitive     CEFTRIAXONE (meningitis) 0.5 SENSITIVE Sensitive     * STREPTOCOCCUS PNEUMONIAE  Blood Culture ID Panel (Reflexed)     Status: Abnormal   Collection Time: 11/18/16 12:37 AM  Result Value Ref Range Status   Enterococcus species NOT DETECTED NOT DETECTED Final   Listeria monocytogenes NOT DETECTED NOT DETECTED Final   Staphylococcus species NOT DETECTED NOT DETECTED Final   Staphylococcus aureus NOT DETECTED NOT DETECTED Final   Streptococcus species DETECTED (A) NOT DETECTED Final    Comment: CRITICAL RESULT CALLED TO, READ BACK BY AND VERIFIED WITH: M. BELL, RPHARMD AT 1638 ON 11/18/16 BY C. JESSUP, MLT.    Streptococcus agalactiae NOT DETECTED NOT DETECTED Final   Streptococcus pneumoniae DETECTED (A) NOT DETECTED Final    Comment: CRITICAL RESULT CALLED TO, READ BACK BY AND VERIFIED WITH: M. BELL, RPHARMD AT 1638 ON 11/18/16 BY C. JESSUP, MLT.    Streptococcus pyogenes NOT DETECTED NOT DETECTED Final   Acinetobacter baumannii NOT DETECTED NOT DETECTED Final   Enterobacteriaceae species NOT DETECTED NOT DETECTED Final   Enterobacter cloacae complex NOT DETECTED NOT DETECTED Final   Escherichia coli NOT DETECTED NOT DETECTED Final   Klebsiella oxytoca NOT DETECTED NOT DETECTED Final   Klebsiella pneumoniae NOT DETECTED NOT DETECTED Final   Proteus species NOT DETECTED NOT DETECTED Final   Serratia marcescens NOT DETECTED NOT DETECTED Final   Haemophilus influenzae NOT DETECTED NOT DETECTED Final   Neisseria meningitidis NOT DETECTED NOT DETECTED Final   Pseudomonas aeruginosa NOT DETECTED NOT DETECTED Final   Candida  albicans NOT DETECTED NOT DETECTED Final   Candida glabrata NOT DETECTED NOT DETECTED Final   Candida krusei NOT DETECTED NOT DETECTED Final   Candida parapsilosis NOT DETECTED NOT DETECTED Final   Candida tropicalis NOT DETECTED NOT DETECTED Final  Blood culture (routine x 2)     Status: Abnormal   Collection Time: 11/18/16 12:50 AM  Result Value Ref Range Status   Specimen Description BLOOD LEFT FOREARM  Final   Special Requests   Final    BOTTLES DRAWN AEROBIC AND ANAEROBIC Blood Culture adequate volume   Culture  Setup Time   Final    GRAM POSITIVE COCCI IN PAIRS IN CHAINS IN BOTH AEROBIC AND ANAEROBIC BOTTLES CRITICAL VALUE  NOTED.  VALUE IS CONSISTENT WITH PREVIOUSLY REPORTED AND CALLED VALUE.    Culture (A)  Final    STREPTOCOCCUS PNEUMONIAE SUSCEPTIBILITIES PERFORMED ON PREVIOUS CULTURE WITHIN THE LAST 5 DAYS.    Report Status 11/20/2016 FINAL  Final  Urine culture     Status: None   Collection Time: 11/18/16  4:33 AM  Result Value Ref Range Status   Specimen Description URINE, CATHETERIZED  Final   Special Requests NONE  Final   Culture NO GROWTH  Final   Report Status 11/19/2016 FINAL  Final  Respiratory Panel by PCR     Status: None   Collection Time: 11/18/16  4:35 AM  Result Value Ref Range Status   Adenovirus NOT DETECTED NOT DETECTED Final   Coronavirus 229E NOT DETECTED NOT DETECTED Final   Coronavirus HKU1 NOT DETECTED NOT DETECTED Final   Coronavirus NL63 NOT DETECTED NOT DETECTED Final   Coronavirus OC43 NOT DETECTED NOT DETECTED Final   Metapneumovirus NOT DETECTED NOT DETECTED Final   Rhinovirus / Enterovirus NOT DETECTED NOT DETECTED Final   Influenza A NOT DETECTED NOT DETECTED Final   Influenza B NOT DETECTED NOT DETECTED Final   Parainfluenza Virus 1 NOT DETECTED NOT DETECTED Final   Parainfluenza Virus 2 NOT DETECTED NOT DETECTED Final   Parainfluenza Virus 3 NOT DETECTED NOT DETECTED Final   Parainfluenza Virus 4 NOT DETECTED NOT DETECTED Final    Respiratory Syncytial Virus NOT DETECTED NOT DETECTED Final   Bordetella pertussis NOT DETECTED NOT DETECTED Final   Chlamydophila pneumoniae NOT DETECTED NOT DETECTED Final   Mycoplasma pneumoniae NOT DETECTED NOT DETECTED Final  Body fluid culture     Status: None (Preliminary result)   Collection Time: 11/18/16  4:01 PM  Result Value Ref Range Status   Specimen Description PLEURAL RIGHT  Final   Special Requests NONE  Final   Gram Stain   Final    ABUNDANT WBC PRESENT, PREDOMINANTLY MONONUCLEAR NO ORGANISMS SEEN    Culture NO GROWTH 2 DAYS  Final   Report Status PENDING  Incomplete    Lorella Nimrod, [facility] [place] [phone] pager   [phone] cell 11/21/2016, 10:30 AM

## 2016-11-21 NOTE — Plan of Care (Signed)
Problem: Safety: Goal: Ability to remain free from injury will improve Outcome: Progressing No falls, skin break down or other injuries this shift. Safety bundle in place.   Problem: Skin Integrity: Goal: Risk for impaired skin integrity will decrease Outcome: Progressing New sacral foam pad placed on pt per protocol. Pt repositioned q2h.

## 2016-11-22 ENCOUNTER — Other Ambulatory Visit: Payer: Medicare Other

## 2016-11-22 LAB — BASIC METABOLIC PANEL
Anion gap: 8 (ref 5–15)
BUN: 30 mg/dL — AB (ref 6–20)
CHLORIDE: 99 mmol/L — AB (ref 101–111)
CO2: 24 mmol/L (ref 22–32)
CREATININE: 0.89 mg/dL (ref 0.61–1.24)
Calcium: 8 mg/dL — ABNORMAL LOW (ref 8.9–10.3)
GFR calc Af Amer: >60 mL/min (ref >60)
GFR calc non Af Amer: >60 mL/min (ref >60)
GLUCOSE: 104 mg/dL — AB (ref 65–99)
POTASSIUM: 3.7 mmol/L (ref 3.5–5.1)
Sodium: 131 mmol/L — ABNORMAL LOW (ref 135–145)

## 2016-11-22 LAB — BODY FLUID CULTURE: CULTURE: NO GROWTH

## 2016-11-22 LAB — CBC
HCT: 33.2 % — ABNORMAL LOW (ref 39.0–52.0)
HEMOGLOBIN: 10.8 g/dL — AB (ref 13.0–17.0)
MCH: 29.3 pg (ref 26.0–34.0)
MCHC: 32.5 g/dL (ref 30.0–36.0)
MCV: 90 fL (ref 78.0–100.0)
Platelets: 408 10*3/uL — ABNORMAL HIGH (ref 150–400)
RBC: 3.69 MIL/uL — AB (ref 4.22–5.81)
RDW: 14.7 % (ref 11.5–15.5)
WBC: 13.4 10*3/uL — ABNORMAL HIGH (ref 4.0–10.5)

## 2016-11-22 LAB — BRAIN NATRIURETIC PEPTIDE: B Natriuretic Peptide: 678.9 pg/mL — ABNORMAL HIGH (ref 0.0–100.0)

## 2016-11-22 MED ORDER — FUROSEMIDE 10 MG/ML IJ SOLN
40.0000 mg | Freq: Two times a day (BID) | INTRAMUSCULAR | Status: AC
  Administered 2016-11-22 – 2016-11-23 (×2): 40 mg via INTRAVENOUS
  Filled 2016-11-22 (×2): qty 4

## 2016-11-22 NOTE — Progress Notes (Signed)
PROGRESS NOTE   [name]  GGE:[id number]    DOB: Aug 08, 1945    DOA: 11/17/2016  PCP: Flossie Buffy, NP   I have briefly reviewed patients previous medical records in [facility].  Brief Narrative:  71 year old male with a PMH of chronic diastolic CHF (last echo 08/16/48: LVEF 65-70 percent, Dr. 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 Shirlyn Goltz, urology), previous empyema status post right VATS in January 2018, recent fall 3 weeks PTA, anxiety, presented with complaints of progressively worsening bilateral lower extremity swelling, right >left, not relieved by increased dose of Lasix, also associated pain. Initially admitted for evaluation and management of lower extremity swelling and possible HCAP. Subsequent CT chest showed enlarging pleural effusion. Now has pneumococcal bacteremia of unclear source. ID and TCTS consulting.IV diuresis ongoing for acute on chronic diastolic CHF.   Assessment & Plan:   Principal Problem:   Bacteremia due to Streptococcus pneumoniae Active Problems:   COPD (chronic obstructive pulmonary disease) (HCC)   Protein-calorie malnutrition, severe (HCC)   Acute respiratory failure with hypoxia (HCC)   Empyema (HCC)   Persistent atrial fibrillation (HCC)   Acute on chronic diastolic CHF (congestive heart failure) (HCC)   Pulmonary HTN (HCC)   History of prostate cancer   Hyponatremia   Pneumonia   HCAP (healthcare-associated pneumonia)   Sepsis ([place])   Anasarca   Hypoalbuminemia due to protein-calorie malnutrition (HCC)   Right leg pain   AKI (acute kidney injury) ([place])   Acute urinary retention   1. Sepsis due to pneumococcal bacteremia: Met sepsis criteria on admission. Initially was on IV cefepime and vancomycin, then changed to IV ceftriaxone followed finally by oral amoxicillin as per ID recommendations. Discussed with Dr. Megan Salon on 7/11 who is not  entirely certain that patient has pneumonia. His right-sided pleural effusion has enlarged but this is transudative and does not appear infected. He recommended completing 14 day course of antibiotics with oral amoxicillin through 12/01/16. RSV panel negative. Urine streptococcal antigen positive. Urine legionella antigen negative. No TEE indicated. Surveillance blood cultures from 7/11: Negative to date. ID signed off 7/12. 2. Loculated pleural effusion: TCTS consultation appreciated and recommended IR evaluation for CT-guided pigtail catheter. As per IR, pleural effusion is loculated and pigtail catheter may not help. Thereby plans to place pigtail catheter canceled and periodically follow chest x-rays >next on 7/14. 3. Anasarca/lower extremity edema/acute on chronic diastolic CHF/hypoalbuminemia: BNP 433.1. 2-D echo unremarkable and results as below. Lower extremity venous Dopplers negative for DVT. CT of the right leg without acute findings. TSH 1.662. Bilateral lower extremity TED hose. Started IV Lasix 40 mg every 12 hours. -3.055 L since admission. Edema slowly improving but has significant volume overload. Continue IV Lasix for additional 24 hours and then consider transitioning to oral Lasix at discharge. Advised primary cardiologist of patient's hospitalization. 4. Right lower extremity pain: Unclear etiology.? Musculoskeletal related to recent fall 2 weeks ago. Workup as above essentially unremarkable. Supportive treatment. Improving. PT recommends home health PT at discharge. Continues to improve. 5. Hyponatremia: Suspect due to hypervolemia. Improving. Start diuretics and follow BMP. Stable sodium. 6. 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 CT chest showed calcified hilar and mediastinal lymph nodes/granuloma. This will need close outpatient follow-up and management. Outpatient follow-up with urology and may need oncology consultation. Patient and family  indicate that they have urology appointment Monday of next week. 7. Acute  kidney injury: Resolved. 8. Chronic A. fib: Continue Xarelto. Controlled ventricular rate. 9. Acute urinary retention:? Related to prostate cancer. Currently has Foley catheter. Voiding trial in the next 1 or 2 days. Outpatient follow-up with Dr. Jeffie Pollock (had appointment on 7/16. Attempt voiding trial after IV Lasix completed on 7/14, prior to discharge. 10. Leukocytosis: May be related to ongoing infection. Improving. Follow CBCs. 11. Hypoalbuminemia:? Nutritional. Probably a major contributor to anasarca. Dietitian consultation. 12. Anemia: No bleeding reported. Hemoglobin stable in the 10 g range. 13. Anxiety: Somnolent this morning due to Ativan he received at night, minimize benzodiazepines or sedatives   DVT prophylaxis: On Xarelto Code Status: Full Family Communication: Discussed in detail with patient's spouse at bedside. Disposition: DC home when medically improved, with home health PT, possible discharge home on 7/14.   Consultants:  Infectious disease Interventional radiology TCTS   Procedures:   Right Thoracentesis on 11/18/16 yeilding 20 cc;   2-D echo 11/19/16: Study Conclusions  - Left ventricle: The cavity size was normal. Wall thickness was   normal. Systolic function was normal. The estimated ejection   fraction was in the range of 55% to 60%. Wall motion was normal;   there were no regional wall motion abnormalities. - Mitral valve: Calcified annulus. - Left atrium: The atrium was moderately dilated. - Right ventricle: The cavity size was at least mildly dilated. - Right atrium: The atrium was moderately to severely dilated. - Tricuspid valve: There was moderate regurgitation directed   eccentrically. - Pulmonary arteries: Systolic pressure was mildly to moderately   increased. PA peak pressure: 47 mm Hg (S). - Pericardium, extracardiac: There was a left pleural effusion.   Foley  catheter  Antimicrobials:  IV cefepime 7/8-7/10 IV ceftriaxone 7/10 > discontinued  IV Zosyn 1 dose IV vancomycin 7/8-7/11  oral amoxicillin 7/12 >   Subjective: Continues to feel better. No dyspnea or chest pain reported. Leg swelling and right lower extremity pain significantly improved and states that he was able to ambulate with help of PT yesterday. Received Ativan for anxiety overnight and somnolent this morning.  ROS: Denies dyspnea, chest pain or palpitations.  Objective:  Vitals:   11/22/16 0500 11/22/16 0754 11/22/16 0805 11/22/16 1050  BP: 92/61 (!) 89/60  102/70  Pulse: 90 91  90  Resp: 17 15    Temp: 97.8 F (36.6 C)     TempSrc: Oral     SpO2: 94% 91% 91% 91%  Weight: 77.9 kg (171 lb 12.8 oz)     Height:        Examination:  General exam: Pleasant elderly male lying comfortably supine in bed. Respiratory system: Occasional basal crackles. Rest of lung fields clear to auscultation. Cardiovascular system: S1 and S2 heard, irregularly irregular. No JVD or murmurs. Pedal edema continues to improve. Now has 1+ bilateral lower leg/ankle edema. Has bilateral leg stockings. Telemetry: A. fib with controlled ventricular rate. Gastrointestinal system: Abdomen is nondistended, soft and nontender. No organomegaly or masses felt. Normal bowel sounds heard. Foley catheter + draining straw-colored urine.  Central nervous system: Somnolent but easily arousable and oriented. No focal neurological deficits. stable.  Extremities: Symmetric 5 x 5 power. No acute findings in lower extremities except edema which is improving.  Skin: No rashes, lesions or ulcers Psychiatry: Judgement and insight appear normal. Mood & affect appropriate.     Data Reviewed: I have personally reviewed following labs and imaging studies  CBC:  Recent Labs Lab 11/18/16 0040 11/18/16 0453 11/19/16 0348 11/20/16  2409 11/21/16 0451 11/22/16 0318  WBC  --  23.9* 27.9* 23.8* 15.9* 13.4*    NEUTROABS 28.2*  --  25.7* 20.5* 12.4*  --   HGB  --  11.5* 11.6* 12.1* 10.6* 10.8*  HCT  --  35.4* 35.0* 36.3* 32.6* 33.2*  MCV  --  91.2 91.6 90.3 89.8 90.0  PLT  --  381 336 394 391 735*   Basic Metabolic Panel:  Recent Labs Lab 11/18/16 0453 11/19/16 0348 11/20/16 0547 11/21/16 0451 11/22/16 0318  NA 126* 128* 130* 129* 131*  K 5.5* 4.5 4.2 4.1 3.7  CL 95* 99* 98* 97* 99*  CO2 22 19* _0 GLUCOSE 111* 104* 94 103* 104*  BUN 39* 41* 36* 34* 30*  CREATININE 1.08 1.15 1.00 1.16 0.89  CALCIUM 7.7* 8.0* 8.3* 8.1* 8.0*  MG  --  1.7 1.9  --   --   PHOS  --  3.8 3.6  --   --    Liver Function Tests:  Recent Labs Lab 11/18/16 0040 11/18/16 0453 11/19/16 0348 11/20/16 0547  AST 35 41 26 37  ALT 21 16* 16* 23  ALKPHOS 241* 214* 181* 245*  BILITOT 1.1 1.8* 0.8 0.8  PROT 6.5 5.4* 5.3* 6.2*  ALBUMIN 1.7* 1.4* 1.4* 1.4*   Coagulation Profile:  Recent Labs Lab 11/18/16 1241 11/18/16 1830  INR 3.61 3.68   CBG:  Recent Labs Lab 11/20/16 0800 11/20/16 1146 11/20/16 1657 11/20/16 2116 11/21/16 0748  GLUCAP 74 82 85 138* 84    Recent Results (from the past 240 hour(s))  Blood culture (routine x 2)     Status: Abnormal   Collection Time: 11/18/16 12:37 AM  Result Value Ref Range Status   Specimen Description BLOOD RIGHT ANTECUBITAL  Final   Special Requests   Final    BOTTLES DRAWN AEROBIC AND ANAEROBIC Blood Culture adequate volume   Culture  Setup Time   Final    GRAM POSITIVE COCCI IN PAIRS IN CHAINS IN BOTH AEROBIC AND ANAEROBIC BOTTLES CRITICAL RESULT CALLED TO, READ BACK BY AND VERIFIED WITH: Colin Rhein, PHARMD AT 1638 ON 11/18/16 BY C.JESSUP, MLT.    Culture STREPTOCOCCUS PNEUMONIAE (A)  Final   Report Status 11/20/2016 FINAL  Final   Organism ID, Bacteria STREPTOCOCCUS PNEUMONIAE  Final      Susceptibility   Streptococcus pneumoniae - MIC*    ERYTHROMYCIN <=0.12 SENSITIVE Sensitive     LEVOFLOXACIN 0.5 SENSITIVE Sensitive     PENICILLIN  (meningitis) 0.5 RESISTANT Resistant     PENICILLIN (non-meningitis) 0.5 SENSITIVE Sensitive     CEFTRIAXONE (non-meningitis) 0.5 SENSITIVE Sensitive     CEFTRIAXONE (meningitis) 0.5 SENSITIVE Sensitive     * STREPTOCOCCUS PNEUMONIAE  Blood Culture ID Panel (Reflexed)     Status: Abnormal   Collection Time: 11/18/16 12:37 AM  Result Value Ref Range Status   Enterococcus species NOT DETECTED NOT DETECTED Final   Listeria monocytogenes NOT DETECTED NOT DETECTED Final   Staphylococcus species NOT DETECTED NOT DETECTED Final   Staphylococcus aureus NOT DETECTED NOT DETECTED Final   Streptococcus species DETECTED (A) NOT DETECTED Final    Comment: CRITICAL RESULT CALLED TO, READ BACK BY AND VERIFIED WITH: M. BELL, RPHARMD AT 1638 ON 11/18/16 BY C. JESSUP, MLT.    Streptococcus agalactiae NOT DETECTED NOT DETECTED Final   Streptococcus pneumoniae DETECTED (A) NOT DETECTED Final    Comment: CRITICAL RESULT CALLED TO, READ BACK BY AND VERIFIED WITH: Colin Rhein, RPHARMD AT  1638 ON 11/18/16 BY C. JESSUP, MLT.    Streptococcus pyogenes NOT DETECTED NOT DETECTED Final   Acinetobacter baumannii NOT DETECTED NOT DETECTED Final   Enterobacteriaceae species NOT DETECTED NOT DETECTED Final   Enterobacter cloacae complex NOT DETECTED NOT DETECTED Final   Escherichia coli NOT DETECTED NOT DETECTED Final   Klebsiella oxytoca NOT DETECTED NOT DETECTED Final   Klebsiella pneumoniae NOT DETECTED NOT DETECTED Final   Proteus species NOT DETECTED NOT DETECTED Final   Serratia marcescens NOT DETECTED NOT DETECTED Final   Haemophilus influenzae NOT DETECTED NOT DETECTED Final   Neisseria meningitidis NOT DETECTED NOT DETECTED Final   Pseudomonas aeruginosa NOT DETECTED NOT DETECTED Final   Candida albicans NOT DETECTED NOT DETECTED Final   Candida glabrata NOT DETECTED NOT DETECTED Final   Candida krusei NOT DETECTED NOT DETECTED Final   Candida parapsilosis NOT DETECTED NOT DETECTED Final   Candida tropicalis  NOT DETECTED NOT DETECTED Final  Blood culture (routine x 2)     Status: Abnormal   Collection Time: 11/18/16 12:50 AM  Result Value Ref Range Status   Specimen Description BLOOD LEFT FOREARM  Final   Special Requests   Final    BOTTLES DRAWN AEROBIC AND ANAEROBIC Blood Culture adequate volume   Culture  Setup Time   Final    GRAM POSITIVE COCCI IN PAIRS IN CHAINS IN BOTH AEROBIC AND ANAEROBIC BOTTLES CRITICAL VALUE NOTED.  VALUE IS CONSISTENT WITH PREVIOUSLY REPORTED AND CALLED VALUE.    Culture (A)  Final    STREPTOCOCCUS PNEUMONIAE SUSCEPTIBILITIES PERFORMED ON PREVIOUS CULTURE WITHIN THE LAST 5 DAYS.    Report Status 11/20/2016 FINAL  Final  Urine culture     Status: None   Collection Time: 11/18/16  4:33 AM  Result Value Ref Range Status   Specimen Description URINE, CATHETERIZED  Final   Special Requests NONE  Final   Culture NO GROWTH  Final   Report Status 11/19/2016 FINAL  Final  Respiratory Panel by PCR     Status: None   Collection Time: 11/18/16  4:35 AM  Result Value Ref Range Status   Adenovirus NOT DETECTED NOT DETECTED Final   Coronavirus 229E NOT DETECTED NOT DETECTED Final   Coronavirus HKU1 NOT DETECTED NOT DETECTED Final   Coronavirus NL63 NOT DETECTED NOT DETECTED Final   Coronavirus OC43 NOT DETECTED NOT DETECTED Final   Metapneumovirus NOT DETECTED NOT DETECTED Final   Rhinovirus / Enterovirus NOT DETECTED NOT DETECTED Final   Influenza A NOT DETECTED NOT DETECTED Final   Influenza B NOT DETECTED NOT DETECTED Final   Parainfluenza Virus 1 NOT DETECTED NOT DETECTED Final   Parainfluenza Virus 2 NOT DETECTED NOT DETECTED Final   Parainfluenza Virus 3 NOT DETECTED NOT DETECTED Final   Parainfluenza Virus 4 NOT DETECTED NOT DETECTED Final   Respiratory Syncytial Virus NOT DETECTED NOT DETECTED Final   Bordetella pertussis NOT DETECTED NOT DETECTED Final   Chlamydophila pneumoniae NOT DETECTED NOT DETECTED Final   Mycoplasma pneumoniae NOT DETECTED NOT  DETECTED Final  Body fluid culture     Status: None   Collection Time: 11/18/16  4:01 PM  Result Value Ref Range Status   Specimen Description PLEURAL RIGHT  Final   Special Requests NONE  Final   Gram Stain   Final    ABUNDANT WBC PRESENT, PREDOMINANTLY MONONUCLEAR NO ORGANISMS SEEN    Culture NO GROWTH 3 DAYS  Final   Report Status 11/22/2016 FINAL  Final  Culture, blood (routine  x 2)     Status: None (Preliminary result)   Collection Time: 11/20/16  5:47 AM  Result Value Ref Range Status   Specimen Description BLOOD BLOOD RIGHT HAND  Final   Special Requests   Final    Blood Culture adequate volume BOTTLES DRAWN AEROBIC AND ANAEROBIC   Culture NO GROWTH 2 DAYS  Final   Report Status PENDING  Incomplete  Culture, blood (routine x 2)     Status: None (Preliminary result)   Collection Time: 11/20/16  5:47 AM  Result Value Ref Range Status   Specimen Description BLOOD BLOOD LEFT HAND  Final   Special Requests   Final    Blood Culture adequate volume BOTTLES DRAWN AEROBIC AND ANAEROBIC   Culture NO GROWTH 2 DAYS  Final   Report Status PENDING  Incomplete         Radiology Studies: Dg Chest 1 View  Result Date: 11/21/2016 CLINICAL DATA:  Followup pleural effusion and CHF. EXAM: CHEST 1 VIEW COMPARISON:  11/20/2016 FINDINGS: The heart is enlarged but stable. Chronic underlying emphysema and bronchitic changes with suspected persistent superimposed interstitial edema. There is a persistent and slightly larger right pleural effusion along with pseudotumor of fluid in the major fissure. No large left pleural effusion. Persistent bibasilar atelectasis. IMPRESSION: Stable severe underlying lung disease with suspected superimposed interstitial edema/CHF. Persistent and slightly larger right pleural effusion with pseudotumor of fluid in the major fissure. Electronically Signed   By: Marijo Sanes M.D.   On: 11/21/2016 08:22        Scheduled Meds: . amoxicillin  500 mg Oral Q8H  .  feeding supplement (ENSURE ENLIVE)  237 mL Oral TID BM  . ipratropium  0.5 mg Nebulization TID  . levalbuterol  0.63 mg Nebulization TID  . rivaroxaban  20 mg Oral Q supper  . sodium chloride flush  3 mL Intravenous Q12H  . vitamin C  1,000 mg Oral QODAY   Continuous Infusions: . sodium chloride       LOS: 4 days     Ninetta Adelstein, MD, FACP, FHM. [facility] Pager [phone]  If 7PM-7AM, please contact night-coverage [email or web address] Password [email or web address] 11/22/2016, 1:06 PM

## 2016-11-22 NOTE — Progress Notes (Signed)
Physical Therapy Treatment Patient Details Name: [name] MRN: [id number] DOB: [birth year] Today's Date: 11/22/2016    History of Present Illness The patient is a 71 yo Caucasian male with a PMH of Diastolic CHF (last echo revealed an EF of 65-70% 05/14/16), COPD, Atrial fibrillation on Xarelto, Prostate cancer with possible mets to lymph nodes, previous empyema status post right VATS in Jan 2018 and other comorbids who presented to the Emergency Department complaining of sudden-onset, constant bilateral leg swelling (worse on the right) that began several days ago and progressively worsening. Pt fpund to have bacteremia due to streptococcus.    PT Comments    Pt's mobility significantly limited today by pt's lethargy. Pt's wife reports due to Ativan pt received last night. Required more assist today but was able to amb. Hopefully will clear quickly and be able to resume further mobility.   Follow Up Recommendations  Home health PT;Supervision for mobility/OOB     Equipment Recommendations  Rolling walker with 5" wheels;3in1 (PT)    Recommendations for Other Services       Precautions / Restrictions Restrictions Weight Bearing Restrictions: No    Mobility  Bed Mobility Overal bed mobility: Needs Assistance Bed Mobility: Supine to Sit     Supine to sit: HOB elevated;+2 for physical assistance;Mod assist     General bed mobility comments: Assist to move legs off bed and elevate trunk into sitting. More assist needed due to lethargy  Transfers Overall transfer level: Needs assistance Equipment used: Rolling walker (2 wheeled) Transfers: Sit to/from Stand Sit to Stand: +2 physical assistance;Min assist         General transfer comment: Assist to bring hips up and cues for hand placement  Ambulation/Gait Ambulation/Gait assistance: Min assist;+2 safety/equipment Ambulation Distance (Feet): 90 Feet Assistive device: Rolling walker (2 wheeled) Gait  Pattern/deviations: Decreased step length - right;Decreased step length - left;Trunk flexed Gait velocity: decr Gait velocity interpretation: Below normal speed for age/gender General Gait Details: Assist for balance and support. Followed with chair due to lethargy.   Stairs            Wheelchair Mobility    Modified Rankin (Stroke Patients Only)       Balance Overall balance assessment: Needs assistance Sitting-balance support: No upper extremity supported;Feet supported Sitting balance-Leahy Scale: Fair     Standing balance support: Bilateral upper extremity supported Standing balance-Leahy Scale: Poor Standing balance comment: UE support and min A for static standing                            Cognition Arousal/Alertness: Lethargic Behavior During Therapy: WFL for tasks assessed/performed Overall Cognitive Status: Within Functional Limits for tasks assessed                                        Exercises      General Comments        Pertinent Vitals/Pain Pain Assessment: Faces Faces Pain Scale: Hurts little more Pain Location: R LE Pain Descriptors / Indicators: Grimacing Pain Intervention(s): Limited activity within patient's tolerance;Monitored during session    Home Living                      Prior Function            PT Goals (current goals can now be found in  the care plan section) Progress towards PT goals: Not progressing toward goals - comment (limited due to lethargy)    Frequency    Min 3X/week      PT Plan Current plan remains appropriate    Co-evaluation              AM-PAC PT "6 Clicks" Daily Activity  Outcome Measure  Difficulty turning over in bed (including adjusting bedclothes, sheets and blankets)?: Total Difficulty moving from lying on back to sitting on the side of the bed? : Total Difficulty sitting down on and standing up from a chair with arms (e.g., wheelchair, bedside  commode, etc,.)?: Total Help needed moving to and from a bed to chair (including a wheelchair)?: A Little Help needed walking in hospital room?: A Lot Help needed climbing 3-5 steps with a railing? : A Lot 6 Click Score: 10    End of Session Equipment Utilized During Treatment: Gait belt;Oxygen Activity Tolerance: Patient limited by lethargy;Patient limited by fatigue Patient left: in chair;with call bell/phone within reach;with family/visitor present Nurse Communication: Mobility status PT Visit Diagnosis: Muscle weakness (generalized) (M62.81);Difficulty in walking, not elsewhere classified (R26.2);Pain Pain - part of body:  (generalized)     Time: 8325-4982 PT Time Calculation (min) (ACUTE ONLY): 22 min  Charges:  $Gait Training: 8-22 mins                    G Codes:       [facility] PT [place] 11/22/2016, 2:59 PM

## 2016-11-22 NOTE — Care Management Important Message (Signed)
Important Message  Patient Details  Name: [name] MRN: [id number] Date of Birth: 1946/02/01   Medicare Important Message Given:  Yes    Orbie Pyo 11/22/2016, 1:26 PM

## 2016-11-22 NOTE — Consult Note (Addendum)
   [facility] Inpatient Consult   11/22/2016  AMEIR FARIA 1945-08-27 [id number]    Screened for [facility] Care Management services. Went to bedside to speak with Mr. Grigorian and wife. Mr. Faiola was resting but permission was given to speak with Mrs. Dalbert Batman. Wife states Mr. Ozer is sleepy from Ativan.  Explained [place] Management program services. Mrs. Pokorski states "we could use the additional support, especially if it is a free service". Written consent obtained and [facility] Care Management packet provided.  Explained that [place] Management program will not interfere or replace services provided by home health.   Mrs. Bejar endorses they do have a scale but patient does not weigh daily. States " my son gave [place] a scale and it is too fancy to use". States Mr. Mastel is not connected with [facility] telemonitoring program but could benefit from it.  Denies any transportation or medication needs at this time.   Will request Mr. Sturgell to be assigned to [place] for CHF disease and symptom management. He also has a history of persistent afib, COPD, prostate cancer with possible mets, anxiety, pulmonary HTN, sepsis, AKI, pneumonia.   Made inpatient RNCM aware of above.    Marthenia Rolling, MSN-Ed, RN,BSN [facility] Liaison [phone]

## 2016-11-22 NOTE — Telephone Encounter (Signed)
Per DPR form, left detailed message that he will be referred to Afib Clinic for HR management options.  Informed patient the office is closing early today and it will be discussed Monday.

## 2016-11-22 NOTE — Telephone Encounter (Signed)
-----  Message from [name], MD sent at 11/15/2016 12:09 PM EDT ----- Please refer to afib clinic  Traci Turner ----- Message ----- From: Ilean China Sent: 11/15/2016  11:57 AM To: [name], MD  He is back in AF and has some Rt sided CHF.  Not sure what the options are, doesn't appear to be a candidate for Amiodarone with underlying COPD. ? Candidate for Tikosyn

## 2016-11-23 ENCOUNTER — Inpatient Hospital Stay (HOSPITAL_COMMUNITY): Payer: Medicare Other

## 2016-11-23 LAB — BASIC METABOLIC PANEL
Anion gap: 6 (ref 5–15)
BUN: 28 mg/dL — ABNORMAL HIGH (ref 6–20)
CALCIUM: 8 mg/dL — AB (ref 8.9–10.3)
CO2: 28 mmol/L (ref 22–32)
CREATININE: 0.93 mg/dL (ref 0.61–1.24)
Chloride: 97 mmol/L — ABNORMAL LOW (ref 101–111)
GFR calc non Af Amer: >60 mL/min (ref >60)
GLUCOSE: 96 mg/dL (ref 65–99)
Potassium: 3.5 mmol/L (ref 3.5–5.1)
Sodium: 131 mmol/L — ABNORMAL LOW (ref 135–145)

## 2016-11-23 LAB — PROCALCITONIN: Procalcitonin: 0.42 ng/mL

## 2016-11-23 MED ORDER — LEVALBUTEROL HCL 0.63 MG/3ML IN NEBU
0.6300 mg | INHALATION_SOLUTION | Freq: Two times a day (BID) | RESPIRATORY_TRACT | Status: DC
  Administered 2016-11-24: 0.63 mg via RESPIRATORY_TRACT
  Filled 2016-11-23: qty 3

## 2016-11-23 MED ORDER — IPRATROPIUM BROMIDE 0.02 % IN SOLN
0.5000 mg | Freq: Two times a day (BID) | RESPIRATORY_TRACT | Status: DC
  Administered 2016-11-24: 0.5 mg via RESPIRATORY_TRACT
  Filled 2016-11-23: qty 2.5

## 2016-11-23 NOTE — Progress Notes (Signed)
Bladder scan pt for only 20cc x 3 attempts. Pt has not voided yet. Dr. Lavella Lemons notified. Susie Cassette RN

## 2016-11-23 NOTE — Care Management Note (Addendum)
Case Management Note  Patient Details  Name: [name] MRN: [id number] Date of Birth: 09/14/1945  Subjective/Objective:                 Spoke with patients wife. They would like to use [facility] and [facility] for services and DME. Referrals made.    Action/Plan:  Will DC to home a HH PT DME RW 3/1 Oxygen to be delivered to room prior to DC.  Nurse added Expected Discharge Date:                  Expected Discharge Plan:  [place]  In-House Referral:     Discharge planning Services  CM Consult  Post Acute Care Choice:  Durable Medical Equipment, Home Health Choice offered to:  Spouse  DME Arranged:  3-N-1, Oxygen, Walker rolling DME Agency:    HH Arranged:  PT [place]:  [place]  Status of Service:  Completed, signed off  If discussed at [place] of Stay Meetings, dates discussed:    Additional Comments:  Carles Collet, RN 11/23/2016, 2:04 PM

## 2016-11-23 NOTE — Progress Notes (Addendum)
Pt on RA with sats 88% at rest.. Pt placed back on O2 @ 2L with sats returning to 93%. Lasix methods tried and failed still requires O2 . Susie Cassette RN

## 2016-11-23 NOTE — Progress Notes (Addendum)
[address]       [place] [address]             [phone]           Subjective: Patient states his breathing today is about the same as it has been previously.  Objective: Vital signs in last 24 hours: Temp:  [98 F (36.7 C)-98.3 F (36.8 C)] 98 F (36.7 C) (07/14 0500) Pulse Rate:  [75-104] 89 (07/14 0757) Cardiac Rhythm: Atrial fibrillation (07/14 0807) Resp:  [14-18] 18 (07/14 0757) BP: (90-102)/(52-70) 98/53 (07/14 0500) SpO2:  [91 %-97 %] 97 % (07/14 0757) FiO2 (%):  [30 %] 30 % (07/14 0757) Weight:  [77.2 kg (170 lb 4.8 oz)] 77.2 kg (170 lb 4.8 oz) (07/14 0500)     Intake/Output from previous day: 07/13 0701 - 07/14 0700 In: 185 [P.O.:185] Out: 1800 [Urine:1800]   Physical Exam:  Cardiovascular: IRRR IRRR. Pulmonary: Diminished at left base and some crackles   Lab Results: CBC:  Recent Labs  11/21/16 0451 11/22/16 0318  WBC 15.9* 13.4*  HGB 10.6* 10.8*  HCT 32.6* 33.2*  PLT 391 408*   BMET:   Recent Labs  11/22/16 0318 11/23/16 0422  NA 131* 131*  K 3.7 3.5  CL 99* 97*  CO2 24 28  GLUCOSE 104* 96  BUN 30* 28*  CREATININE 0.89 0.93  CALCIUM 8.0* 8.0*    PT/INR: No results for input(s): LABPROT, INR in the last 72 hours. ABG:  INR: Will add last result for INR, ABG once components are confirmed Will add last 4 CBG results once components are confirmed  Assessment/Plan:  1. CV - Chronic a fib. On Xarelto 20 mg at dinner. 2.  Pulmonary - History of COPD, emphysema, and pulmonary hypertension. On 3 liters of oxygen via [place].CXR appears stable when compared to 07/12. 3. Anemia-Last H and H stable at 10.8 and 33.2 4. Leukocytosis-WBC decreased from 15,900 to 13,400. Likely related to below. 5. ID-was on Rocephin for bacteremia secondary to Strep Pneumoniae but now is on Amoxicillin 500 mg tid 6. 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 As discussed withDr. Servando Snare, ok to d/c and will arrange  follow up  Coretha Creswell MPA-C 11/23/2016,10:08 AM

## 2016-11-23 NOTE — Progress Notes (Signed)
Physical Therapy Treatment Patient Details Name: [name] MRN: [id number] DOB: 04-Oct-1945 Today's Date: 11/23/2016    History of Present Illness The patient is a 71 yo Caucasian male with a PMH of Diastolic CHF (last echo revealed an EF of 65-70% 05/14/16), COPD, Atrial fibrillation on Xarelto, Prostate cancer with possible mets to lymph nodes, previous empyema status post right VATS in Jan 2018 and other comorbids who presented to the Emergency Department complaining of sudden-onset, constant bilateral leg swelling (worse on the right) that began several days ago and progressively worsening. Pt fpund to have bacteremia due to streptococcus.    PT Comments    Patient having difficulty progressing with mobility, limited by pain today.  Encouraged patient to sit in chair for meals, and ask to walk with nursing when able.  If not making progress, may need to consider ST-SNF prior to return home.    Follow Up Recommendations  Home health PT;Supervision for mobility/OOB (May need to consider SNF if does not progress)     Equipment Recommendations  Rolling walker with 5" wheels;3in1 (PT)    Recommendations for Other Services       Precautions / Restrictions Precautions Precautions: Fall Restrictions Weight Bearing Restrictions: No    Mobility  Bed Mobility Overal bed mobility: Needs Assistance Bed Mobility: Supine to Sit;Sit to Supine     Supine to sit: Mod assist;HOB elevated Sit to supine: Min assist   General bed mobility comments: Assist to bring LE's off of bed and to raise trunk to sitting.  Patient yelling in pain during transition.  Eased off in sitting.  Transfers Overall transfer level: Needs assistance Equipment used: Rolling walker (2 wheeled) Transfers: Sit to/from Stand Sit to Stand: Mod assist         General transfer comment: Assist to power up to standing.  Once upright, required min assist for balance.  Stood for while prior to  gait.  Ambulation/Gait Ambulation/Gait assistance: Min assist Ambulation Distance (Feet): 60 Feet Assistive device: Rolling walker (2 wheeled) Gait Pattern/deviations: Step-through pattern;Decreased step length - right;Decreased step length - left;Decreased stride length;Shuffle;Trunk flexed Gait velocity: decreased Gait velocity interpretation: Below normal speed for age/gender General Gait Details: Verbal cues to try to stand upright.  Patient with slow, somewhat unsteady gait with RW.    Stairs            Wheelchair Mobility    Modified Rankin (Stroke Patients Only)       Balance           Standing balance support: Bilateral upper extremity supported Standing balance-Leahy Scale: Poor Standing balance comment: UE support and min A for static standing                            Cognition Arousal/Alertness: Lethargic Behavior During Therapy: Flat affect Overall Cognitive Status: Within Functional Limits for tasks assessed                                        Exercises      General Comments        Pertinent Vitals/Pain Pain Assessment: Faces Faces Pain Scale: Hurts even more Pain Location: "all over"  back worse area Pain Descriptors / Indicators: Grimacing Pain Intervention(s): Limited activity within patient's tolerance;Monitored during session;Repositioned    Home Living  Prior Function            PT Goals (current goals can now be found in the care plan section) Acute Rehab PT Goals Patient Stated Goal: go home Progress towards PT goals: Not progressing toward goals - comment (limited by pain)    Frequency    Min 3X/week      PT Plan Current plan remains appropriate    Co-evaluation              AM-PAC PT "6 Clicks" Daily Activity  Outcome Measure  Difficulty turning over in bed (including adjusting bedclothes, sheets and blankets)?: Total Difficulty moving from  lying on back to sitting on the side of the bed? : Total Difficulty sitting down on and standing up from a chair with arms (e.g., wheelchair, bedside commode, etc,.)?: Total Help needed moving to and from a bed to chair (including a wheelchair)?: A Little Help needed walking in hospital room?: A Little Help needed climbing 3-5 steps with a railing? : A Lot 6 Click Score: 11    End of Session Equipment Utilized During Treatment: Gait belt;Oxygen Activity Tolerance: Patient limited by fatigue;Patient limited by pain Patient left: in bed;with call bell/phone within reach;with bed alarm set;with family/visitor present (Patient had been up in chair earlier and requested bed.) Nurse Communication: Mobility status PT Visit Diagnosis: Muscle weakness (generalized) (M62.81);Difficulty in walking, not elsewhere classified (R26.2);Pain Pain - part of body:  (General)     Time: 8101-7510 PT Time Calculation (min) (ACUTE ONLY): 41 min  Charges:  $Gait Training: 23-37 mins $Therapeutic Activity: 8-22 mins                    G Codes:       Carita Pian. Sanjuana Kava, [facility] Acute Rehab Services Pager [place] 11/23/2016, 9:42 PM

## 2016-11-23 NOTE — Progress Notes (Signed)
PROGRESS NOTE   [name]  NGE:[id number]    DOB: 12-07-1945    DOA: 11/17/2016  PCP: Flossie Buffy, NP   I have briefly reviewed patients previous medical records in [facility].  Brief Narrative:  71 year old male with a PMH of chronic diastolic CHF (last echo 4/0/10: LVEF 65-70 percent, Dr. 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 Shirlyn Goltz, urology), previous empyema status post right VATS in January 2018, recent fall 3 weeks PTA, anxiety, presented with complaints of progressively worsening bilateral lower extremity swelling, right >left, not relieved by increased dose of Lasix, also associated pain. Initially admitted for evaluation and management of lower extremity swelling and possible HCAP. Subsequent CT chest showed enlarging pleural effusion. Now has pneumococcal bacteremia of unclear source. ID and TCTS consulting.IV diuresis ongoing for acute on chronic diastolic CHF.   Assessment & Plan:   Principal Problem:   Bacteremia due to Streptococcus pneumoniae Active Problems:   COPD (chronic obstructive pulmonary disease) (HCC)   Protein-calorie malnutrition, severe (HCC)   Acute respiratory failure with hypoxia (HCC)   Empyema (HCC)   Persistent atrial fibrillation (HCC)   Acute on chronic diastolic CHF (congestive heart failure) (HCC)   Pulmonary HTN (HCC)   History of prostate cancer   Hyponatremia   Pneumonia   HCAP (healthcare-associated pneumonia)   Sepsis ([place])   Anasarca   Hypoalbuminemia due to protein-calorie malnutrition (HCC)   Right leg pain   AKI (acute kidney injury) ([place])   Acute urinary retention   1. Sepsis due to pneumococcal bacteremia: Met sepsis criteria on admission. Initially was on IV cefepime and vancomycin, then changed to IV ceftriaxone followed finally by oral amoxicillin as per ID recommendations. Discussed with Dr. Megan Salon on 7/11 who is not  entirely certain that patient has pneumonia. His right-sided pleural effusion has enlarged but this is transudative and does not appear infected. He recommended completing 14 day course of antibiotics with oral amoxicillin through 12/01/16. RSV panel negative. Urine streptococcal antigen positive. Urine legionella antigen negative. No TEE indicated. Surveillance blood cultures from 7/11: Negative to date. ID signed off 7/12. 2. Loculated pleural effusion: TCTS consultation appreciated and recommended IR evaluation for CT-guided pigtail catheter. As per IR, pleural effusion is loculated and pigtail catheter may not help. Thereby plans to place pigtail catheter canceled and periodically follow chest x-rays. Reviewed chest x-ray 7/14 without change. Discussed with TCTS who have cleared him for discharge from their standpoint and will follow-up in a couple of weeks with repeat chest x-ray. 3. Anasarca/lower extremity edema/acute on chronic diastolic CHF/hypoalbuminemia: BNP 433.1. 2-D echo unremarkable and results as below. Lower extremity venous Dopplers negative for DVT. CT of the right leg without acute findings. TSH 1.662. Bilateral lower extremity TED hose. Started IV Lasix 40 mg every 12 hours. - 5.3 L since admission. Edema significantly improved. Received a dose of IV Lasix this morning. Transition to oral Lasix at discharge in a.m. on 7/15 4. Right lower extremity pain: Unclear etiology.? Musculoskeletal related to recent fall 2 weeks ago. Workup as above essentially unremarkable. Supportive treatment. Improving. PT recommends home health PT at discharge. Continues to improve. 5. Hyponatremia: Suspect due to hypervolemia. Improving. Start diuretics and follow BMP. Stable sodium. 6. 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 CT chest showed calcified hilar and mediastinal lymph nodes/granuloma. This will need close outpatient follow-up and management. Outpatient  follow-up with urology and  may need oncology consultation. Patient and family indicate that they have urology appointment Monday of next week. 7. Acute kidney injury: Resolved. 8. Chronic A. fib: Continue Xarelto. Controlled ventricular rate. 9. Acute urinary retention:? Related to prostate cancer. Currently has Foley catheter. Voiding trial in the next 1 or 2 days. Outpatient follow-up with Dr. Jeffie Pollock (had appointment on 7/16. Discontinued Foley catheter this morning. Has not voided since. Bladder scan shows 20 mL urine. Prefer to monitor for voiding prior to discharge. 10. Leukocytosis: May be related to ongoing infection. Improved. 11. Hypoalbuminemia:? Nutritional. Probably a major contributor to anasarca. Dietitian consultation. 12. Anemia: No bleeding reported. Hemoglobin stable in the 10 g range. 13. Anxiety: Stable.   DVT prophylaxis: On Xarelto Code Status: Full Family Communication: Discussed in detail with patient's spouse at bedside. Disposition: DC home when medically improved, with home health PT, possible discharge home on 7/15 after voiding trial   Consultants:  Infectious disease Interventional radiology TCTS   Procedures:   Right Thoracentesis on 11/18/16 yeilding 20 cc;   2-D echo 11/19/16: Study Conclusions  - Left ventricle: The cavity size was normal. Wall thickness was   normal. Systolic function was normal. The estimated ejection   fraction was in the range of 55% to 60%. Wall motion was normal;   there were no regional wall motion abnormalities. - Mitral valve: Calcified annulus. - Left atrium: The atrium was moderately dilated. - Right ventricle: The cavity size was at least mildly dilated. - Right atrium: The atrium was moderately to severely dilated. - Tricuspid valve: There was moderate regurgitation directed   eccentrically. - Pulmonary arteries: Systolic pressure was mildly to moderately   increased. PA peak pressure: 47 mm Hg (S). - Pericardium,  extracardiac: There was a left pleural effusion.   Foley catheter  Antimicrobials:  IV cefepime 7/8-7/10 IV ceftriaxone 7/10 > discontinued  IV Zosyn 1 dose IV vancomycin 7/8-7/11  oral amoxicillin 7/12 >   Subjective: As per spouse, patient slept until 4 PM yesterday afternoon due to Ativan he had received the night prior. Continues to improve. No dyspnea reported. Wants to get up and walk.  ROS: Denies dyspnea, chest pain or palpitations.  Objective:  Vitals:   11/23/16 0757 11/23/16 1300 11/23/16 1312 11/23/16 1320  BP:    90/60  Pulse: 89 83 86 89  Resp: _0 Temp:    98.2 F (36.8 C)  TempSrc:    Oral  SpO2: 97% 95% 93% 94%  Weight:      Height:        Examination:  General exam: Pleasant elderly male lying comfortably supine in bed. Respiratory system: Occasional basal crackles. Rest of lung fields clear to auscultation.No change. Cardiovascular system: S1 and S2 heard, irregularly irregular. No JVD or murmurs. Pedal edema continues to improve. Now trace bilateral ankle edema. Has bilateral leg stockings. Telemetry: A. fib with controlled ventricular rate. Gastrointestinal system: Abdomen is nondistended, soft and nontender. No organomegaly or masses felt. Normal bowel sounds heard. Foley catheter + draining straw-colored urine.  Central nervous system: Alert and oriented. No focal neurological deficits. stable.  Extremities: Symmetric 5 x 5 power. No acute findings in lower extremities except edema which is improving.  Skin: No rashes, lesions or ulcers Psychiatry: Judgement and insight appear normal. Mood & affect appropriate.     Data Reviewed: I have personally reviewed following labs and imaging studies  CBC:  Recent Labs Lab 11/18/16 0040 11/18/16 0453 11/19/16 0348 11/20/16 0547 11/21/16  0451 11/22/16 0318  WBC  --  23.9* 27.9* 23.8* 15.9* 13.4*  NEUTROABS 28.2*  --  25.7* 20.5* 12.4*  --   HGB  --  11.5* 11.6* 12.1* 10.6* 10.8*  HCT   --  35.4* 35.0* 36.3* 32.6* 33.2*  MCV  --  91.2 91.6 90.3 89.8 90.0  PLT  --  381 336 394 391 193*   Basic Metabolic Panel:  Recent Labs Lab 11/19/16 0348 11/20/16 0547 11/21/16 0451 11/22/16 0318 11/23/16 0422  NA 128* 130* 129* 131* 131*  K 4.5 4.2 4.1 3.7 3.5  CL 99* 98* 97* 99* 97*  CO2 19* _0 GLUCOSE 104* 94 103* 104* 96  BUN 41* 36* 34* 30* 28*  CREATININE 1.15 1.00 1.16 0.89 0.93  CALCIUM 8.0* 8.3* 8.1* 8.0* 8.0*  MG 1.7 1.9  --   --   --   PHOS 3.8 3.6  --   --   --    Liver Function Tests:  Recent Labs Lab 11/18/16 0040 11/18/16 0453 11/19/16 0348 11/20/16 0547  AST 35 41 26 37  ALT 21 16* 16* 23  ALKPHOS 241* 214* 181* 245*  BILITOT 1.1 1.8* 0.8 0.8  PROT 6.5 5.4* 5.3* 6.2*  ALBUMIN 1.7* 1.4* 1.4* 1.4*   Coagulation Profile:  Recent Labs Lab 11/18/16 1241 11/18/16 1830  INR 3.61 3.68   CBG:  Recent Labs Lab 11/20/16 0800 11/20/16 1146 11/20/16 1657 11/20/16 2116 11/21/16 0748  GLUCAP 74 82 85 138* 84    Recent Results (from the past 240 hour(s))  Blood culture (routine x 2)     Status: Abnormal   Collection Time: 11/18/16 12:37 AM  Result Value Ref Range Status   Specimen Description BLOOD RIGHT ANTECUBITAL  Final   Special Requests   Final    BOTTLES DRAWN AEROBIC AND ANAEROBIC Blood Culture adequate volume   Culture  Setup Time   Final    GRAM POSITIVE COCCI IN PAIRS IN CHAINS IN BOTH AEROBIC AND ANAEROBIC BOTTLES CRITICAL RESULT CALLED TO, READ BACK BY AND VERIFIED WITH: Colin Rhein, PHARMD AT 1638 ON 11/18/16 BY C.JESSUP, MLT.    Culture STREPTOCOCCUS PNEUMONIAE (A)  Final   Report Status 11/20/2016 FINAL  Final   Organism ID, Bacteria STREPTOCOCCUS PNEUMONIAE  Final      Susceptibility   Streptococcus pneumoniae - MIC*    ERYTHROMYCIN <=0.12 SENSITIVE Sensitive     LEVOFLOXACIN 0.5 SENSITIVE Sensitive     PENICILLIN (meningitis) 0.5 RESISTANT Resistant     PENICILLIN (non-meningitis) 0.5 SENSITIVE Sensitive      CEFTRIAXONE (non-meningitis) 0.5 SENSITIVE Sensitive     CEFTRIAXONE (meningitis) 0.5 SENSITIVE Sensitive     * STREPTOCOCCUS PNEUMONIAE  Blood Culture ID Panel (Reflexed)     Status: Abnormal   Collection Time: 11/18/16 12:37 AM  Result Value Ref Range Status   Enterococcus species NOT DETECTED NOT DETECTED Final   Listeria monocytogenes NOT DETECTED NOT DETECTED Final   Staphylococcus species NOT DETECTED NOT DETECTED Final   Staphylococcus aureus NOT DETECTED NOT DETECTED Final   Streptococcus species DETECTED (A) NOT DETECTED Final    Comment: CRITICAL RESULT CALLED TO, READ BACK BY AND VERIFIED WITH: M. BELL, RPHARMD AT 1638 ON 11/18/16 BY C. JESSUP, MLT.    Streptococcus agalactiae NOT DETECTED NOT DETECTED Final   Streptococcus pneumoniae DETECTED (A) NOT DETECTED Final    Comment: CRITICAL RESULT CALLED TO, READ BACK BY AND VERIFIED WITH: Colin Rhein, RPHARMD AT 1638 ON 11/18/16  BY C. JESSUP, MLT.    Streptococcus pyogenes NOT DETECTED NOT DETECTED Final   Acinetobacter baumannii NOT DETECTED NOT DETECTED Final   Enterobacteriaceae species NOT DETECTED NOT DETECTED Final   Enterobacter cloacae complex NOT DETECTED NOT DETECTED Final   Escherichia coli NOT DETECTED NOT DETECTED Final   Klebsiella oxytoca NOT DETECTED NOT DETECTED Final   Klebsiella pneumoniae NOT DETECTED NOT DETECTED Final   Proteus species NOT DETECTED NOT DETECTED Final   Serratia marcescens NOT DETECTED NOT DETECTED Final   Haemophilus influenzae NOT DETECTED NOT DETECTED Final   Neisseria meningitidis NOT DETECTED NOT DETECTED Final   Pseudomonas aeruginosa NOT DETECTED NOT DETECTED Final   Candida albicans NOT DETECTED NOT DETECTED Final   Candida glabrata NOT DETECTED NOT DETECTED Final   Candida krusei NOT DETECTED NOT DETECTED Final   Candida parapsilosis NOT DETECTED NOT DETECTED Final   Candida tropicalis NOT DETECTED NOT DETECTED Final  Blood culture (routine x 2)     Status: Abnormal   Collection  Time: 11/18/16 12:50 AM  Result Value Ref Range Status   Specimen Description BLOOD LEFT FOREARM  Final   Special Requests   Final    BOTTLES DRAWN AEROBIC AND ANAEROBIC Blood Culture adequate volume   Culture  Setup Time   Final    GRAM POSITIVE COCCI IN PAIRS IN CHAINS IN BOTH AEROBIC AND ANAEROBIC BOTTLES CRITICAL VALUE NOTED.  VALUE IS CONSISTENT WITH PREVIOUSLY REPORTED AND CALLED VALUE.    Culture (A)  Final    STREPTOCOCCUS PNEUMONIAE SUSCEPTIBILITIES PERFORMED ON PREVIOUS CULTURE WITHIN THE LAST 5 DAYS.    Report Status 11/20/2016 FINAL  Final  Urine culture     Status: None   Collection Time: 11/18/16  4:33 AM  Result Value Ref Range Status   Specimen Description URINE, CATHETERIZED  Final   Special Requests NONE  Final   Culture NO GROWTH  Final   Report Status 11/19/2016 FINAL  Final  Respiratory Panel by PCR     Status: None   Collection Time: 11/18/16  4:35 AM  Result Value Ref Range Status   Adenovirus NOT DETECTED NOT DETECTED Final   Coronavirus 229E NOT DETECTED NOT DETECTED Final   Coronavirus HKU1 NOT DETECTED NOT DETECTED Final   Coronavirus NL63 NOT DETECTED NOT DETECTED Final   Coronavirus OC43 NOT DETECTED NOT DETECTED Final   Metapneumovirus NOT DETECTED NOT DETECTED Final   Rhinovirus / Enterovirus NOT DETECTED NOT DETECTED Final   Influenza A NOT DETECTED NOT DETECTED Final   Influenza B NOT DETECTED NOT DETECTED Final   Parainfluenza Virus 1 NOT DETECTED NOT DETECTED Final   Parainfluenza Virus 2 NOT DETECTED NOT DETECTED Final   Parainfluenza Virus 3 NOT DETECTED NOT DETECTED Final   Parainfluenza Virus 4 NOT DETECTED NOT DETECTED Final   Respiratory Syncytial Virus NOT DETECTED NOT DETECTED Final   Bordetella pertussis NOT DETECTED NOT DETECTED Final   Chlamydophila pneumoniae NOT DETECTED NOT DETECTED Final   Mycoplasma pneumoniae NOT DETECTED NOT DETECTED Final  Body fluid culture     Status: None   Collection Time: 11/18/16  4:01 PM  Result  Value Ref Range Status   Specimen Description PLEURAL RIGHT  Final   Special Requests NONE  Final   Gram Stain   Final    ABUNDANT WBC PRESENT, PREDOMINANTLY MONONUCLEAR NO ORGANISMS SEEN    Culture NO GROWTH 3 DAYS  Final   Report Status 11/22/2016 FINAL  Final  Culture, blood (routine x 2)  Status: None (Preliminary result)   Collection Time: 11/20/16  5:47 AM  Result Value Ref Range Status   Specimen Description BLOOD BLOOD RIGHT HAND  Final   Special Requests   Final    Blood Culture adequate volume BOTTLES DRAWN AEROBIC AND ANAEROBIC   Culture NO GROWTH 3 DAYS  Final   Report Status PENDING  Incomplete  Culture, blood (routine x 2)     Status: None (Preliminary result)   Collection Time: 11/20/16  5:47 AM  Result Value Ref Range Status   Specimen Description BLOOD BLOOD LEFT HAND  Final   Special Requests   Final    Blood Culture adequate volume BOTTLES DRAWN AEROBIC AND ANAEROBIC   Culture NO GROWTH 3 DAYS  Final   Report Status PENDING  Incomplete         Radiology Studies: Dg Chest Port 1 View  Result Date: 11/23/2016 CLINICAL DATA:  Cardioversion 06/2018Hx of chronic bronchitis, CHF, COPD, pneumonia, HTN, skin ca. Former smoker x60 years EXAM: PORTABLE CHEST 1 VIEW COMPARISON:  11/21/2016 and prior studies. FINDINGS: Lungs show irregular interstitial thickening, right greater than left, with a moderate right pleural effusion partly loculated along the right lateral hemithorax extending into the minor fissure. Allowing for differences in patient positioning, the findings are similar to the most recent prior exam. No pneumothorax. Cardiac silhouette is normal in size. IMPRESSION: 1. No significant change from most recent prior exam. 2. Persistent bilateral irregular interstitial thickening superimposed on changes of COPD/emphysema. Pleural effusions, greater on the right, partly loculated along the right hemithorax and in the minor fissure. Electronically Signed   By:  Lajean Manes M.D.   On: 11/23/2016 08:10        Scheduled Meds: . amoxicillin  500 mg Oral Q8H  . feeding supplement (ENSURE ENLIVE)  237 mL Oral TID BM  . ipratropium  0.5 mg Nebulization TID  . levalbuterol  0.63 mg Nebulization TID  . rivaroxaban  20 mg Oral Q supper  . sodium chloride flush  3 mL Intravenous Q12H  . vitamin C  1,000 mg Oral QODAY   Continuous Infusions: . sodium chloride       LOS: 5 days     Kamryn Messineo, MD, FACP, FHM. [facility] Pager [phone]  If 7PM-7AM, please contact night-coverage [email or web address] Password [facility] 11/23/2016, 3:37 PM

## 2016-11-24 MED ORDER — FUROSEMIDE 40 MG PO TABS
40.0000 mg | ORAL_TABLET | Freq: Every day | ORAL | Status: DC
  Administered 2016-11-24: 40 mg via ORAL
  Filled 2016-11-24: qty 1

## 2016-11-24 MED ORDER — ACETAMINOPHEN 325 MG PO TABS: 650.0000 mg | ORAL_TABLET | Freq: Four times a day (QID) | ORAL | Status: DC | PRN

## 2016-11-24 MED ORDER — ENSURE ENLIVE PO LIQD: 237.0000 mL | Freq: Three times a day (TID) | ORAL | Status: DC

## 2016-11-24 MED ORDER — AMOXICILLIN 500 MG PO CAPS: 500.0000 mg | ORAL_CAPSULE | Freq: Three times a day (TID) | ORAL | 0 refills | Status: DC

## 2016-11-24 NOTE — Progress Notes (Signed)
[facility] here to pick up patient

## 2016-11-24 NOTE — Progress Notes (Signed)
[facility] called and will be here to pick up patient for home as soon as possible

## 2016-11-24 NOTE — Discharge Summary (Signed)
Physician Discharge Summary  [name] NFA:[id number] DOB: 1945-06-27  PCP: Flossie Buffy, NP  Admit date: 11/17/2016 Discharge date: 11/24/2016  Recommendations for Outpatient Follow-up:  1. Wilfred Lacy, NP/PCP in one week with repeat labs (CBC & BMP). Please follow final blood culture results that were sent from the hospital on 11/20/16. 2. Dr. Fransico Him, Cardiology in 2 weeks. 3. Dr. Jola Schmidt, Urology: Patient advised to keep prior appointment that he has on 11/25/2016. 4. Dr.Peter Prescott Gum, TCTS: MDs office will arrange follow-up along with chest x-ray in a couple of weeks.   Home Health: PT and RN. Equipment/Devices: Rolling walker with 5 inch wheels, 3 n 1, home oxygen at 2 L/m via nasal cannula.  Discharge Condition: Improved and stable  CODE STATUS: Full  Diet recommendation: Heart healthy diet.  Discharge Diagnoses:  Principal Problem:   Bacteremia due to Streptococcus pneumoniae Active Problems:   COPD (chronic obstructive pulmonary disease) (HCC)   Protein-calorie malnutrition, severe (HCC)   Acute respiratory failure with hypoxia (HCC)   Empyema (HCC)   Persistent atrial fibrillation (HCC)   Acute on chronic diastolic CHF (congestive heart failure) (HCC)   Pulmonary HTN (HCC)   History of prostate cancer   Hyponatremia   Pneumonia   HCAP (healthcare-associated pneumonia)   Sepsis ([place])   Anasarca   Hypoalbuminemia due to protein-calorie malnutrition (HCC)   Right leg pain   AKI (acute kidney injury) ([place])   Acute urinary retention   Brief Summary: 71 year old male with a PMH of chronic diastolic CHF (last echo 08/17/94: LVEF 65-70 percent, Dr. 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 Shirlyn Goltz, urology), previous empyema status post right VATS in January 2018, recent fall 3 weeks PTA, anxiety, presented with complaints of progressively worsening bilateral  lower extremity swelling, right >left, not relieved by increased dose of Lasix, also associated pain. Initially admitted for evaluation and management of lower extremity swelling and possible HCAP. Subsequent CT chest showed enlarging pleural effusion. Treated for pneumococcal bacteremia of unclear source. ID and TCTS consulted.IV diuresed for acute on chronic diastolic CHF.   Assessment & Plan:   1. Sepsis due to pneumococcal bacteremia: Met sepsis criteria on admission. Initially was on IV cefepime and vancomycin, then changed to IV ceftriaxone followed finally by oral amoxicillin as per ID recommendations. As per ID, not entirely certain that patient has pneumonia. His right-sided pleural effusion had enlarged but this is transudative and does not appear infected. ID recommended completing 14 day course of antibiotics with oral amoxicillin through 12/01/16. RSV panel negative. Urine streptococcal antigen positive. Urine legionella antigen negative. No TEE indicated. Surveillance blood cultures from 7/11: Negative to date.  2. Loculated pleural effusion: TCTS consultation appreciated and recommended IR evaluation for CT-guided pigtail catheter. As per IR, pleural effusion is loculated and pigtail catheter may not help. Thereby plans to place pigtail catheter canceled and periodically follow chest x-rays. Reviewed chest x-ray 7/14 without change. Discussed with TCTS who have cleared him for discharge from their standpoint and will follow-up in a couple of weeks with repeat chest x-ray. 3. Anasarca/lower extremity edema/acute on chronic diastolic CHF/hypoalbuminemia: BNP 433.1>679. 2-D echo unremarkable and results as below. Lower extremity venous Dopplers negative for DVT. CT of the right leg without acute findings. TSH 1.662. Bilateral lower extremity TED hose. Treated with a couple days of IV Lasix 40 mg every 12 hours. - 5.2 L since admission. Weight decreased from 184 pounds  on 7/12 to 170 pounds on  7/14. Lower extremity edema has almost resolved. Transitioned back to home dose of Lasix and Aldactone at discharge. Outpatient follow-up with cardiology. 4. Right lower extremity pain: Unclear etiology.? Musculoskeletal related to recent fall 2 weeks ago. Workup as above essentially unremarkable. Supportive treatment. Improved. PT recommends home health PT at discharge versus SNF. Discussed in detail with patient and spouse regarding going to SNF for rehabilitation which both respectfully declined and wished to go home with home health therapies.  5. Hyponatremia: Suspect due to hypervolemia. Stable. 6. 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 CT chest showed calcified hilar and mediastinal lymph nodes/granuloma. This will need close outpatient follow-up and management. Outpatient follow-up with Urology and may need oncology consultation. Patient and family indicate that they have urology appointment on 7/16 and was advised to keep that appointment. 7. Acute kidney injury: Resolved. 8. Chronic A. fib: Continue Xarelto. Controlled ventricular rate. 9. Acute urinary retention:? Related to prostate cancer. He had Foley catheter placed for a couple of days. This was removed on day prior to admission and patient has been voiding without difficulty. Outpatient follow-up with Dr. Jeffie Pollock (has appointment on 7/16.  10. Leukocytosis: May be related to ongoing infection. Improved. 11. Hypoalbuminemia:? Nutritional. Probably a major contributor to anasarca. Dietitian consultation. 12. Anemia: No bleeding reported. Hemoglobin stable in the 10 g range. 13. Anxiety: Stable. Patient got very somnolent with a single dose of Ativan, 2 nights ago. Avoid benzodiazepines.   Consultants:  Infectious disease Interventional radiology TCTS   Procedures:   Right Thoracentesis on 11/18/16 yeilding 20 cc;   2-D echo 11/19/16: Study Conclusions  - Left ventricle: The cavity  size was normal. Wall thickness was normal. Systolic function was normal. The estimated ejection fraction was in the range of 55% to 60%. Wall motion was normal; there were no regional wall motion abnormalities. - Mitral valve: Calcified annulus. - Left atrium: The atrium was moderately dilated. - Right ventricle: The cavity size was at least mildly dilated. - Right atrium: The atrium was moderately to severely dilated. - Tricuspid valve: There was moderate regurgitation directed eccentrically. - Pulmonary arteries: Systolic pressure was mildly to moderately increased. PA peak pressure: 47 mm Hg (S). - Pericardium, extracardiac: There was a left pleural effusion.   Foley catheter-discontinued 7/14   Discharge Instructions  Discharge Instructions    (HEART FAILURE PATIENTS) Call MD:  Anytime you have any of the following symptoms: 1) 3 pound weight gain in 24 hours or 5 pounds in 1 week 2) shortness of breath, with or without a dry hacking cough 3) swelling in the hands, feet or stomach 4) if you have to sleep on extra pillows at night in order to breathe.    Complete by:  As directed    AMB Referral to [facility]    Complete by:  As directed    Please assign to [facility] for CHF disease and symptom management. Could also benefit from [facility] tele-monitoring program. Written consent obtained. Currently at [facility]. Marthenia Rolling, [place], RN,BSN [facility] Liaison-[phone]   Reason for consult:  Please assign to [place]   Diagnoses of:   Heart Failure COPD/ Pneumonia     Expected date of contact:  1-3 days (reserved for hospital discharges)   Call MD for:  difficulty breathing, headache or visual disturbances    Complete by:  As directed    Call MD for:  extreme fatigue    Complete by:  As directed    Call MD for:  persistant dizziness or light-headedness    Complete by:  As directed    Call MD for:  redness, tenderness, or  signs of infection (pain, swelling, redness, odor or green/yellow discharge around incision site)    Complete by:  As directed    Call MD for:  severe uncontrolled pain    Complete by:  As directed    Call MD for:  temperature >100.4    Complete by:  As directed    Diet - low sodium heart healthy    Complete by:  As directed    Discharge instructions    Complete by:  As directed    Continue to use bilateral lower extremity compression stockings.   Increase activity slowly    Complete by:  As directed        Medication List    TAKE these medications   acetaminophen 325 MG tablet Commonly known as:  TYLENOL Take 2 tablets (650 mg total) by mouth every 6 (six) hours as needed for mild pain or fever.   amoxicillin 500 MG capsule Commonly known as:  AMOXIL Take 1 capsule (500 mg total) by mouth 3 (three) times daily.   feeding supplement (ENSURE ENLIVE) Liqd Take 237 mLs by mouth 3 (three) times daily between meals.   furosemide 40 MG tablet Commonly known as:  LASIX Take 40 mg by mouth daily.   rivaroxaban 20 MG Tabs tablet Commonly known as:  XARELTO Take 1 tablet (20 mg total) by mouth daily with supper.   spironolactone 25 MG tablet Commonly known as:  ALDACTONE Take 0.5 tablets (12.5 mg total) by mouth daily.   traMADol 50 MG tablet Commonly known as:  ULTRAM Take 1 tablet (50 mg total) by mouth at bedtime as needed for moderate pain.   vitamin C 1000 MG tablet Take 1,000 mg by mouth every other day.      Follow-up Information    [name], MD Follow up.   Specialty:  Cardiothoracic Surgery Why:  PA/LAT CXR to be taken (at [facility] which is in the same building as Dr. Lucianne Lei Tright's office) one hour prior to office appointment with Dr. Prescott Gum. Office will call with appointment date and time. Contact information: [address] [place] [address] [phone]        Flossie Buffy, NP. Schedule an appointment as soon  as possible for a visit in 1 week(s).   Specialty:  Internal Medicine Why:  To be seen with repeat labs (CBC & BMP). Contact information: [address] [place] [address] [phone]        Irine Seal, MD Follow up on 11/25/2016.   Specialty:  Urology Why:  Keep the prior appointment you have for evaluation of your prostate. Contact information: [place] [address] [phone]        [name], MD. Schedule an appointment as soon as possible for a visit in 2 week(s).   Specialty:  Cardiology Contact information: [address] [place] [address] [place] Follow up.   Why:  for home oxygen Contact information: [address] [address] [phone]          Allergies  Allergen Reactions  . Demerol [Meperidine] Other (See Comments)  UNSPECIFIED REACTION  Causes system to shutdown. ?   . Oxycodone Hcl Other (See Comments)    Pt states this medication 'wires him up' and makes pt hyper; pt does not want to take this ever again      Procedures/Studies: Dg Chest 1 View  Result Date: 11/21/2016 CLINICAL DATA:  Followup pleural effusion and CHF. EXAM: CHEST 1 VIEW COMPARISON:  11/20/2016 FINDINGS: The heart is enlarged but stable. Chronic underlying emphysema and bronchitic changes with suspected persistent superimposed interstitial edema. There is a persistent and slightly larger right pleural effusion along with pseudotumor of fluid in the major fissure. No large left pleural effusion. Persistent bibasilar atelectasis. IMPRESSION: Stable severe underlying lung disease with suspected superimposed interstitial edema/CHF. Persistent and slightly larger right pleural effusion with pseudotumor of fluid in the major fissure. Electronically Signed   By: Marijo Sanes M.D.   On: 11/21/2016 08:22   Dg Chest 1 View  Result Date: 11/18/2016 CLINICAL DATA:  Followup  thoracentesis. EXAM: CHEST 1 VIEW COMPARISON:  Earlier same day FINDINGS: Bilateral pleural effusions. Less pleural fluid seen on the right when compared to the immediate prior film. Some loculation does persist. Possible small amount of air in a loculation projected over the lower chest. No free pneumothorax. No other change. IMPRESSION: Less pleural density on the right than seen earlier. Persistent bilateral pleural effusions. Loculations on the right. Possible small amount of air within a loculation projected over the lower chest. No free pneumothorax. Electronically Signed   By: Nelson Chimes M.D.   On: 11/18/2016 16:10   Dg Chest 2 View  Result Date: 11/20/2016 CLINICAL DATA:  Pneumonia.  COPD. EXAM: CHEST  2 VIEW COMPARISON:  11/19/2016 FINDINGS: Mild cardiomegaly stable. No significant change in diffuse interstitial infiltrates, likely due to interstitial edema. Pulmonary hyperinflation again seen, consistent with COPD. No evidence of pulmonary consolidation. Small bilateral pleural effusions are again seen tracking into the right major and minor fissures, without significant change. IMPRESSION: Diffuse pulmonary interstitial edema and small bilateral pleural effusions, without significant change. COPD. Electronically Signed   By: Earle Gell M.D.   On: 11/20/2016 08:09   Dg Chest 2 View  Result Date: 11/19/2016 CLINICAL DATA:  Shortness of breath, cough. EXAM: CHEST  2 VIEW COMPARISON:  Radiographs of November 18, 2016. FINDINGS: Stable cardiomediastinal silhouette. No pneumothorax is noted. Small bilateral pleural effusions are noted ; right pleural effusion is probably loculated. Stable diffuse interstitial densities are noted throughout both lungs concerning for edema or inflammation. Stable right upper lobe opacity is noted concerning for pneumonia or atelectasis or possibly loculated fluid component. IMPRESSION: Stable diffuse lung densities concerning for edema or inflammation with stable bilateral  pleural effusions. Electronically Signed   By: Marijo Conception, M.D.   On: 11/19/2016 10:19   Ct Chest Wo Contrast  Result Date: 11/18/2016 CLINICAL DATA:  71 year old male with pleural effusion. History of prostate cancer and thoracic adenopathy. EXAM: CT CHEST WITHOUT CONTRAST TECHNIQUE: Multidetector CT imaging of the chest was performed following the standard protocol without IV contrast. COMPARISON:  Chest radiograph dated 11/18/2016 FINDINGS: Evaluation of this exam is limited in the absence of intravenous contrast. Cardiovascular: There is no cardiomegaly. No significant pericardial effusion. Coronary vascular calcification primarily involving the LAD and RCA. There is moderate atherosclerotic calcification of the thoracic aorta. Mediastinum/Nodes: Calcified hilar and mediastinal lymph nodes/granuloma. There is fullness of the hilar region which may represent mildly enlarged lymph nodes. Subcarinal mass/adenopathy or conglomerate of large lymph nodes with  a combined dimension of approximately 5.5 x 5.7 cm (previously 5.3 x 4.3 cm on CT of 10/02/2016). Evaluation and comparison however is somewhat limited in the absence of contrast. Prevascular space adenopathy measures 14 mm in short axis. Grossly stable retrotracheal adenopathy as well as multiple enlarged lymph nodes in the AP window. The esophagus is grossly unremarkable as visualized. Lungs/Pleura: Small left and moderate right pleural effusion have increased in size compared to prior study. The right pleural effusion appears loculated and extends into the fissure. There is associated partial compressive atelectasis of the lower lobes. Superimposed infiltrate is not excluded. There is background of emphysema. Evaluation of the previously described pulmonary nodules is limited due to increased pleural effusion and associated atelectasis or infiltrate. Upper Abdomen: The visualized upper abdomen is grossly unremarkable. Musculoskeletal: There is diffuse  subcutaneous edema and anasarca. Scoliosis with degenerative changes of the spine. No acute fracture. IMPRESSION: 1. Increase in the size of the pleural effusion compared to the prior study with associated partial compressive atelectasis of the lower lobes. Pneumonia is not excluded. Clinical correlation is recommended. 2. Moderate emphysema. 3. Bulky mediastinal and mild bilateral axillary adenopathy. Evaluation of the lymph nodes is somewhat limited on this noncontrast study. 4. Diffuse soft tissue edema and anasarca. 5. Aortic Atherosclerosis (ICD10-I70.0) and Emphysema (ICD10-J43.9). 6. Electronically Signed   By: Anner Crete M.D.   On: 11/18/2016 05:21   Ct Femur Right Wo Contrast  Result Date: 11/20/2016 CLINICAL DATA:  Right leg pain.  Unable to bear weight. EXAM: CT OF THE LOWER RIGHT EXTREMITY WITHOUT CONTRAST TECHNIQUE: Multidetector CT imaging of the right lower extremity was performed according to the standard protocol. COMPARISON:  None. FINDINGS: Bones/Joint/Cartilage There is slight arthritic changes of the right hip joint. There is no fracture or dislocation or bone destruction. There is slight degenerative changes at the right knee joint with a small knee joint effusion. Soft tissues There is diffuse edema in the subcutaneous tissues of the right thigh, nonspecific. There is no focal muscle abnormality. IMPRESSION: 1. No acute bone abnormality. 2. Slight arthritic changes of the right hip and right knee. 3. Small right knee effusion. 4. Diffuse nonspecific subcutaneous edema in the thigh. Electronically Signed   By: Lorriane Shire M.D.   On: 11/20/2016 08:57   Ct Tibia Fibula Right Wo Contrast  Result Date: 11/20/2016 CLINICAL DATA:  Right lower extremity pain. Patient is unable to bear weight. EXAM: CT OF THE LOWER RIGHT EXTREMITY WITHOUT CONTRAST TECHNIQUE: Multidetector CT imaging of the right lower leg was performed according to the standard protocol. COMPARISON:  None. FINDINGS:  Bones/Joint/Cartilage There is no fracture or dislocation or other significant bone abnormality. Small knee joint effusion. No ankle effusion. Soft tissues There is diffuse subcutaneous edema, nonspecific. The muscles appear normal. IMPRESSION: 1. No acute bone abnormality. 2. Small on right knee fusion. 3. Diffuse prominent subcutaneous edema throughout the right lower leg, nonspecific. Electronically Signed   By: Lorriane Shire M.D.   On: 11/20/2016 08:59   Nm Pet Image Initial (pi) Skull Base To Thigh  Dg Chest Port 1 View  Result Date: 11/23/2016 CLINICAL DATA:  Cardioversion 06/2018Hx of chronic bronchitis, CHF, COPD, pneumonia, HTN, skin ca. Former smoker x60 years EXAM: PORTABLE CHEST 1 VIEW COMPARISON:  11/21/2016 and prior studies. FINDINGS: Lungs show irregular interstitial thickening, right greater than left, with a moderate right pleural effusion partly loculated along the right lateral hemithorax extending into the minor fissure. Allowing for differences in patient positioning, the findings  are similar to the most recent prior exam. No pneumothorax. Cardiac silhouette is normal in size. IMPRESSION: 1. No significant change from most recent prior exam. 2. Persistent bilateral irregular interstitial thickening superimposed on changes of COPD/emphysema. Pleural effusions, greater on the right, partly loculated along the right hemithorax and in the minor fissure. Electronically Signed   By: Lajean Manes M.D.   On: 11/23/2016 08:10   Dg Chest Port 1 View  Result Date: 11/18/2016 CLINICAL DATA:  Leukocytosis and leg swelling. History of COPD, chronic bronchitis and prostate cancer. EXAM: PORTABLE CHEST 1 VIEW COMPARISON:  PET-CT October 31, 2016 and chest radiograph October 22, 2016 FINDINGS: Enlarging small to moderate RIGHT and small LEFT pleural effusions. Patchy consolidation projecting RIGHT upper lung zone in bilateral lower lung zone with diffuse interstitial prominence, increased from prior  examination. Hyperinflation. Cardiac silhouette is mildly enlarged, calcified aortic knob. a increasing fullness of the LEFT aortopulmonary window corresponding to lymphadenopathy on prior CT chest. RIGHT upper lobe calcified granuloma. No pneumothorax. Broad dextroscoliosis with calcified mediastinal lymph nodes. IMPRESSION: Multifocal consolidation with increasing interstitial prominence concerning for bronchopneumonia in a background of COPD. Enlarging small to moderate small LEFT pleural effusions. Followup PA and lateral chest X-ray is recommended in 3-4 weeks following trial of antibiotic therapy to ensure resolution and exclude underlying malignancy. Mild cardiomegaly. Electronically Signed   By: Elon Alas M.D.   On: 11/18/2016 01:46   Ir Thoracentesis Asp Pleural Space W/img Guide  Result Date: 11/18/2016 INDICATION: Bilateral pleural effusions EXAM: ULTRASOUND GUIDED RIGHT THORACENTESIS MEDICATIONS: None. COMPLICATIONS: None immediate. PROCEDURE: An ultrasound guided thoracentesis was thoroughly discussed with the patient and questions answered. The benefits, risks, alternatives and complications were also discussed. The patient understands and wishes to proceed with the procedure. Written consent was obtained. Ultrasound was performed to localize and mark an adequate pocket of fluid in the right chest. The area was then prepped and draped in the normal sterile fashion. 1% Lidocaine was used for local anesthesia. Under ultrasound guidance a Safe-T-Centesis catheter was introduced. Thoracentesis was performed. The catheter was removed and a dressing applied. FINDINGS: A total of approximately 20 cc of clear yellow fluid was removed. Samples were sent to the laboratory as requested by the clinical team. IMPRESSION: Successful ultrasound guided right thoracentesis yielding 20 cc of pleural fluid. Electronically Signed   By: Marybelle Killings M.D.   On: 11/18/2016 16:31      Subjective: Patient was  able to void urine after catheter was removed yesterday. No dyspnea or chest pain reported. Leg swelling has almost resolved. Pain in the right leg has significantly improved. Discussed in detail regarding PT recommendations of going to SNF for rehabilitation which both respectfully declined.  Discharge Exam:  Vitals:   11/24/16 0445 11/24/16 0938 11/24/16 1005 11/24/16 1335  BP: (!) 105/58  94/63 (!) 93/58  Pulse: 91  91 73  Resp: 16  (!) 0   Temp: 98.3 F (36.8 C)     TempSrc: Oral   Oral  SpO2: 95% 96% (!) 89% 98%  Weight:      Height:        General exam: Pleasant elderly male lying comfortably supine in bed. Respiratory system: Occasional basal crackles. Rest of lung fields clear to auscultation. Cardiovascular system: S1 and S2 heard, irregularly irregular. No JVD or murmurs. Pedal edema seems to have resolved. Has bilateral leg stockings. Telemetry: A. fib with controlled ventricular rate. Gastrointestinal system: Abdomen is nondistended, soft and nontender. No organomegaly or  masses felt. Normal bowel sounds heard.  Central nervous system: Alert and oriented. No focal neurological deficits.  Extremities: Symmetric 5 x 5 power. No acute findings.  Skin: No rashes, lesions or ulcers Psychiatry: Judgement and insight appear normal. Mood & affect appropriate.     The results of significant diagnostics from this hospitalization (including imaging, microbiology, ancillary and laboratory) are listed below for reference.     Microbiology: Recent Results (from the past 240 hour(s))  Blood culture (routine x 2)     Status: Abnormal   Collection Time: 11/18/16 12:37 AM  Result Value Ref Range Status   Specimen Description BLOOD RIGHT ANTECUBITAL  Final   Special Requests   Final    BOTTLES DRAWN AEROBIC AND ANAEROBIC Blood Culture adequate volume   Culture  Setup Time   Final    GRAM POSITIVE COCCI IN PAIRS IN CHAINS IN BOTH AEROBIC AND ANAEROBIC BOTTLES CRITICAL RESULT CALLED  TO, READ BACK BY AND VERIFIED WITH: Colin Rhein, PHARMD AT 1638 ON 11/18/16 BY C.JESSUP, MLT.    Culture STREPTOCOCCUS PNEUMONIAE (A)  Final   Report Status 11/20/2016 FINAL  Final   Organism ID, Bacteria STREPTOCOCCUS PNEUMONIAE  Final      Susceptibility   Streptococcus pneumoniae - MIC*    ERYTHROMYCIN <=0.12 SENSITIVE Sensitive     LEVOFLOXACIN 0.5 SENSITIVE Sensitive     PENICILLIN (meningitis) 0.5 RESISTANT Resistant     PENICILLIN (non-meningitis) 0.5 SENSITIVE Sensitive     CEFTRIAXONE (non-meningitis) 0.5 SENSITIVE Sensitive     CEFTRIAXONE (meningitis) 0.5 SENSITIVE Sensitive     * STREPTOCOCCUS PNEUMONIAE  Blood Culture ID Panel (Reflexed)     Status: Abnormal   Collection Time: 11/18/16 12:37 AM  Result Value Ref Range Status   Enterococcus species NOT DETECTED NOT DETECTED Final   Listeria monocytogenes NOT DETECTED NOT DETECTED Final   Staphylococcus species NOT DETECTED NOT DETECTED Final   Staphylococcus aureus NOT DETECTED NOT DETECTED Final   Streptococcus species DETECTED (A) NOT DETECTED Final    Comment: CRITICAL RESULT CALLED TO, READ BACK BY AND VERIFIED WITH: M. BELL, RPHARMD AT 1638 ON 11/18/16 BY C. JESSUP, MLT.    Streptococcus agalactiae NOT DETECTED NOT DETECTED Final   Streptococcus pneumoniae DETECTED (A) NOT DETECTED Final    Comment: CRITICAL RESULT CALLED TO, READ BACK BY AND VERIFIED WITH: M. BELL, RPHARMD AT 1638 ON 11/18/16 BY C. JESSUP, MLT.    Streptococcus pyogenes NOT DETECTED NOT DETECTED Final   Acinetobacter baumannii NOT DETECTED NOT DETECTED Final   Enterobacteriaceae species NOT DETECTED NOT DETECTED Final   Enterobacter cloacae complex NOT DETECTED NOT DETECTED Final   Escherichia coli NOT DETECTED NOT DETECTED Final   Klebsiella oxytoca NOT DETECTED NOT DETECTED Final   Klebsiella pneumoniae NOT DETECTED NOT DETECTED Final   Proteus species NOT DETECTED NOT DETECTED Final   Serratia marcescens NOT DETECTED NOT DETECTED Final   Haemophilus  influenzae NOT DETECTED NOT DETECTED Final   Neisseria meningitidis NOT DETECTED NOT DETECTED Final   Pseudomonas aeruginosa NOT DETECTED NOT DETECTED Final   Candida albicans NOT DETECTED NOT DETECTED Final   Candida glabrata NOT DETECTED NOT DETECTED Final   Candida krusei NOT DETECTED NOT DETECTED Final   Candida parapsilosis NOT DETECTED NOT DETECTED Final   Candida tropicalis NOT DETECTED NOT DETECTED Final  Blood culture (routine x 2)     Status: Abnormal   Collection Time: 11/18/16 12:50 AM  Result Value Ref Range Status   Specimen Description BLOOD LEFT  FOREARM  Final   Special Requests   Final    BOTTLES DRAWN AEROBIC AND ANAEROBIC Blood Culture adequate volume   Culture  Setup Time   Final    GRAM POSITIVE COCCI IN PAIRS IN CHAINS IN BOTH AEROBIC AND ANAEROBIC BOTTLES CRITICAL VALUE NOTED.  VALUE IS CONSISTENT WITH PREVIOUSLY REPORTED AND CALLED VALUE.    Culture (A)  Final    STREPTOCOCCUS PNEUMONIAE SUSCEPTIBILITIES PERFORMED ON PREVIOUS CULTURE WITHIN THE LAST 5 DAYS.    Report Status 11/20/2016 FINAL  Final  Urine culture     Status: None   Collection Time: 11/18/16  4:33 AM  Result Value Ref Range Status   Specimen Description URINE, CATHETERIZED  Final   Special Requests NONE  Final   Culture NO GROWTH  Final   Report Status 11/19/2016 FINAL  Final  Respiratory Panel by PCR     Status: None   Collection Time: 11/18/16  4:35 AM  Result Value Ref Range Status   Adenovirus NOT DETECTED NOT DETECTED Final   Coronavirus 229E NOT DETECTED NOT DETECTED Final   Coronavirus HKU1 NOT DETECTED NOT DETECTED Final   Coronavirus NL63 NOT DETECTED NOT DETECTED Final   Coronavirus OC43 NOT DETECTED NOT DETECTED Final   Metapneumovirus NOT DETECTED NOT DETECTED Final   Rhinovirus / Enterovirus NOT DETECTED NOT DETECTED Final   Influenza A NOT DETECTED NOT DETECTED Final   Influenza B NOT DETECTED NOT DETECTED Final   Parainfluenza Virus 1 NOT DETECTED NOT DETECTED Final    Parainfluenza Virus 2 NOT DETECTED NOT DETECTED Final   Parainfluenza Virus 3 NOT DETECTED NOT DETECTED Final   Parainfluenza Virus 4 NOT DETECTED NOT DETECTED Final   Respiratory Syncytial Virus NOT DETECTED NOT DETECTED Final   Bordetella pertussis NOT DETECTED NOT DETECTED Final   Chlamydophila pneumoniae NOT DETECTED NOT DETECTED Final   Mycoplasma pneumoniae NOT DETECTED NOT DETECTED Final  Body fluid culture     Status: None   Collection Time: 11/18/16  4:01 PM  Result Value Ref Range Status   Specimen Description PLEURAL RIGHT  Final   Special Requests NONE  Final   Gram Stain   Final    ABUNDANT WBC PRESENT, PREDOMINANTLY MONONUCLEAR NO ORGANISMS SEEN    Culture NO GROWTH 3 DAYS  Final   Report Status 11/22/2016 FINAL  Final  Culture, blood (routine x 2)     Status: None (Preliminary result)   Collection Time: 11/20/16  5:47 AM  Result Value Ref Range Status   Specimen Description BLOOD BLOOD RIGHT HAND  Final   Special Requests   Final    Blood Culture adequate volume BOTTLES DRAWN AEROBIC AND ANAEROBIC   Culture NO GROWTH 4 DAYS  Final   Report Status PENDING  Incomplete  Culture, blood (routine x 2)     Status: None (Preliminary result)   Collection Time: 11/20/16  5:47 AM  Result Value Ref Range Status   Specimen Description BLOOD BLOOD LEFT HAND  Final   Special Requests   Final    Blood Culture adequate volume BOTTLES DRAWN AEROBIC AND ANAEROBIC   Culture NO GROWTH 4 DAYS  Final   Report Status PENDING  Incomplete     Labs: CBC:  Recent Labs Lab 11/18/16 0040 11/18/16 0453 11/19/16 0348 11/20/16 0547 11/21/16 0451 11/22/16 0318  WBC  --  23.9* 27.9* 23.8* 15.9* 13.4*  NEUTROABS 28.2*  --  25.7* 20.5* 12.4*  --   HGB  --  11.5* 11.6* 12.1*  10.6* 10.8*  HCT  --  35.4* 35.0* 36.3* 32.6* 33.2*  MCV  --  91.2 91.6 90.3 89.8 90.0  PLT  --  381 336 394 391 492*   Basic Metabolic Panel:  Recent Labs Lab 11/19/16 0348 11/20/16 0547 11/21/16 0451  11/22/16 0318 11/23/16 0422  NA 128* 130* 129* 131* 131*  K 4.5 4.2 4.1 3.7 3.5  CL 99* 98* 97* 99* 97*  CO2 19* _0 GLUCOSE 104* 94 103* 104* 96  BUN 41* 36* 34* 30* 28*  CREATININE 1.15 1.00 1.16 0.89 0.93  CALCIUM 8.0* 8.3* 8.1* 8.0* 8.0*  MG 1.7 1.9  --   --   --   PHOS 3.8 3.6  --   --   --    Liver Function Tests:  Recent Labs Lab 11/18/16 0040 11/18/16 0453 11/19/16 0348 11/20/16 0547  AST 35 41 26 37  ALT 21 16* 16* 23  ALKPHOS 241* 214* 181* 245*  BILITOT 1.1 1.8* 0.8 0.8  PROT 6.5 5.4* 5.3* 6.2*  ALBUMIN 1.7* 1.4* 1.4* 1.4*   BNP (last 3 results)  Recent Labs  11/18/16 0036 11/19/16 0909 11/22/16 0318  BNP 640.7* 433.1* 678.9*   CBG:  Recent Labs Lab 11/20/16 0800 11/20/16 1146 11/20/16 1657 11/20/16 2116 11/21/16 0748  GLUCAP 74 82 85 138* 84   Urinalysis    Component Value Date/Time   COLORURINE AMBER (A) 11/18/2016 0433   APPEARANCEUR CLOUDY (A) 11/18/2016 0433   LABSPEC 1.021 11/18/2016 0433   PHURINE 5.0 11/18/2016 0433   GLUCOSEU NEGATIVE 11/18/2016 0433   HGBUR SMALL (A) 11/18/2016 0433   BILIRUBINUR NEGATIVE 11/18/2016 0433   KETONESUR NEGATIVE 11/18/2016 0433   PROTEINUR 100 (A) 11/18/2016 0433   NITRITE NEGATIVE 11/18/2016 0433   LEUKOCYTESUR NEGATIVE 11/18/2016 0433    Discussed in detail with patient's spouse at bedside. Updated care and answered questions.  Time coordinating discharge: Over 30 minutes  SIGNED:  Vernell Leep, MD, FACP, [place]. [facility] Pager [phone]  If 7PM-7AM, please contact night-coverage [email or web address] Password [email or web address] 11/24/2016, 4:08 PM

## 2016-11-24 NOTE — Progress Notes (Signed)
Case manager contacted regarding discharge today.Patient has home oxygen in the room.

## 2016-11-24 NOTE — Plan of Care (Signed)
Problem: Safety: Goal: Ability to remain free from injury will improve Outcome: Progressing No incidence of falls during this admission. Call bell within reach. Bed in low and locked position. Patient alert and oriented. Clean and clear environment maintained. 3/4 siderails in place. Nonskid footwear being utilized. Patient verbalized understanding of safety instruction.  Problem: Activity: Goal: Ability to tolerate increased activity will improve Outcome: Progressing Patient walked to the room door and back to the bed twice today with PT and did well per the physical therapist. Patient also was able to get up to the [facility] with one assist earlier in the night and moved well. No SOB or chest pain.

## 2016-11-24 NOTE — Progress Notes (Signed)
Clinical Social Worker was consulted by RN to assist with pt transport home. CSW met patient and wife at bedside to discuss transportation needs. Pt wife stated that patient is unable to ambulate and their is 4-5 stairs leading up into the home. CSW explained the role of PTAR and stated PTAR will make sure patient is brought into the home. CSW stated to family that [facility] will be called once he has been cleared by RN and has received his O2 tank.   Rhea Pink, MSW,  [place]

## 2016-11-24 NOTE — Progress Notes (Signed)
SATURATION QUALIFICATIONS: (This note is used to comply with regulatory documentation for home oxygen)  Patient Saturations on Room Air at Rest = 87%  Patient Saturations on Room Air while Ambulating = N/A, unable to ambulate  Patient Saturations on 2 Liters of oxygen while at rest with oxygen = 92%  Please briefly explain why patient needs home oxygen:

## 2016-11-25 ENCOUNTER — Other Ambulatory Visit: Payer: Self-pay | Admitting: *Deleted

## 2016-11-25 ENCOUNTER — Ambulatory Visit: Payer: Medicare Other | Admitting: Physician Assistant

## 2016-11-25 ENCOUNTER — Encounter: Payer: Self-pay | Admitting: *Deleted

## 2016-11-25 ENCOUNTER — Telehealth: Payer: Self-pay | Admitting: *Deleted

## 2016-11-25 ENCOUNTER — Other Ambulatory Visit: Payer: Self-pay | Admitting: Urology

## 2016-11-25 DIAGNOSIS — C61 Malignant neoplasm of prostate: Secondary | ICD-10-CM | POA: Diagnosis not present

## 2016-11-25 DIAGNOSIS — N500 Atrophy of testis: Secondary | ICD-10-CM | POA: Diagnosis not present

## 2016-11-25 DIAGNOSIS — I509 Heart failure, unspecified: Secondary | ICD-10-CM

## 2016-11-25 LAB — CULTURE, BLOOD (ROUTINE X 2)
Culture: NO GROWTH
Culture: NO GROWTH
SPECIAL REQUESTS: ADEQUATE
SPECIAL REQUESTS: ADEQUATE

## 2016-11-25 NOTE — Telephone Encounter (Signed)
Transition Care Management Follow-up Telephone Call   Date discharged? 11/24/16   How have you been since you were released from the hospital? Spoke w/wife she states husband is doing fine   Do you understand why you were in the hospital? YES   Do you understand the discharge instructions? YES   Where were you discharged to? Home   Items Reviewed:  Medications reviewed: YES  Allergies reviewed: YES  Dietary changes reviewed: YES- heart healthy  Referrals reviewed: NO   Functional Questionnaire:   Activities of Daily Living (ADLs):   She states he are independent in the following: ambulation, bathing and hygiene, feeding, continence, grooming, toileting and dressing States he doesn't require assistance    Any transportation issues/concerns?: NO   Any patient concerns? NO   Confirmed importance and date/time of follow-up visits scheduled YES, appt 12/02/16  Provider Appointment booked with Wilfred Lacy  Confirmed with patient if condition begins to worsen call PCP or go to the ER.  Patient was given the office number and encouraged to call back with question or concerns.  : YES

## 2016-11-25 NOTE — Patient Outreach (Addendum)
[name]) Care Management  11/25/2016  [name] 1946-03-30 [id number]   7/13-Received a referral for Christopher Men, RN (covering [name]) Transition of care contact based upon a discharge date 11/24/2016 (Bacteremia due to Strep Pneumoniae). Written consent obtain as indicated by Marthenia Rolling, RN liaison.  HX: Pneumonia/HF/COPD/Atril Fibrillation  RN spoke with pt today and confirmed identifiers. Pt requested RN to speak directly with his wife concerning the transition of care inquires as pt remains nearby for any other inquires. Wife indicates pt recent visited the urologist today and remains exhausted from his recent hospital discharge. RN introduced the [facility] program and services (receptive). Wife confirms some information in completing the transition of care template and all medications have been reviewed and pt is taking his recommended medications as prescribed. Several topics discussed related to pt's possible needs: HF- Pt has not weight daily and recently obtain a new digital scale that needs to be set up. Pt in no distress and breathing fine with his new O2 tanks he was discharged with on 2 liters and breathing with no reported issues. RN strongly encouraged pt to weight daily ad educated pt/caregiver on the GREEN zone related to HF symptoms. Verified pt remains in the GREEN zone with no distressful symptoms. Discussed 3 lbs overnight or 5 lbs within one week to contact his provider concerning any precipitating symptoms. COPD-Controlled via home oxygen now on 2 liters however this recommendation related to pt's recent hospitalization diagnosis (Bacteremia due to strep pneumoniae).  Due to the above information provided RN offer community home visits with a [place] however declined. RN also offered ongoing transition of care weekly follow up calls of inquires on pt's possible needs (agreed). Discussed plan of care and generate goals related to HF and  new Home O2 device. Will refer accordingly if additional [facility] services are needed and inquired on any needed community resources none indicated at this time. No pharmacy or social worker needs at this time but will continue to evaluate accordingly with week transition of care calls.  Patient was recently discharged from hospital and all medications have been reviewed.  Pending appointments: Primary 7/23, CAD 7/30 and pt saw his urologist today.  [name], RN Care Management Coordinator [place] Office [phone]

## 2016-11-26 ENCOUNTER — Telehealth: Payer: Self-pay | Admitting: Cardiology

## 2016-11-26 NOTE — Telephone Encounter (Signed)
New message    Pt wife is calling asking for a call back. He just got home from hospital on Sunday.   Pt c/o swelling: STAT is pt has developed SOB within 24 hours  1. How long have you been experiencing swelling? Since Monday  2. Where is the swelling located? Feet and ankles  3.  Are you currently taking a "fluid pill"? Lasix   4.  Are you currently SOB? No  5.  Have you traveled recently? No

## 2016-11-26 NOTE — Telephone Encounter (Signed)
Follow Up:   Pt wife calling to find out what she needs to do.

## 2016-11-26 NOTE — Telephone Encounter (Signed)
Increase to 45m BID for 3 days then back to 450mdaily and call I edema does not improve.  Make sure he is following a low sodium diet.  His swelling was mainly from low albumin so he needs to followup with his PCP ASAP to make sure he is getting enough protein in his diet

## 2016-11-26 NOTE — Telephone Encounter (Signed)
Instructed DPR to have patient INCREASE LASIX to 40 mg BID for 3 days. She will call if swelling does not improve. She will call PCP tomorrow to make ASAP appointment. She was grateful for call and agrees with treatment plan.

## 2016-11-26 NOTE — Telephone Encounter (Signed)
Follow up     Pt wife calling back concerned she has not heard anything back about [facility]

## 2016-11-26 NOTE — Telephone Encounter (Signed)
He took an extra half dose (Lasix 20 mg) because of the swelling.  He knows to take 40 mg daily.

## 2016-11-26 NOTE — Telephone Encounter (Signed)
Patient's wife called complaining of bilateral swelling in patient's feet and ankles. He was discharged from the hospital Sunday and was on IV Lasix. The patient woke up Monday and "his feet were huge." She states his feet are too large for his shoes. He has not been weighing as she states he still cannot get up to weigh. He has been elevating his feet. He has not been wearing compression hose.  He has not been eating anything at all, so his salt intake is very low. He has no SOB or CP. BP is 100ish/60s (this is normal for him). Confirmed the patient is taking medications as directed. The patient will take an extra 1/2 dose of Lasix (20 mg) now (most recent creatinine 0.93).  He has follow-up appointment 7/30 with M/ Bonnell Public, PA. He awaits further recommendations from Dr. Radford Pax.

## 2016-11-26 NOTE — Telephone Encounter (Signed)
He was supposed to be on Lasix 27m daily when he went home - please verify this

## 2016-11-27 ENCOUNTER — Telehealth: Payer: Self-pay | Admitting: *Deleted

## 2016-11-27 ENCOUNTER — Encounter: Payer: Self-pay | Admitting: *Deleted

## 2016-11-27 NOTE — Telephone Encounter (Signed)
Rec'd call from pt wife stating husband was in hospital, and not seeing [place] until Monday 12/02/16. They spoke w/ his cardiologist and was advise that the reason he's swelling was mainly from low albumin, and for him to f/u with his PCP to make sure he is getting enough protein in his diet. She is wanting to know is there something he can take or do to increase protein in his diet...[name]

## 2016-11-27 NOTE — Telephone Encounter (Signed)
Protein intake can be increase with use of Ensure high protein products, lean and low sodium animal products.

## 2016-11-27 NOTE — Addendum Note (Signed)
Addended by: [name] D on: 11/27/2016 11:12 AM   Modules accepted: Orders

## 2016-11-28 ENCOUNTER — Telehealth (HOSPITAL_COMMUNITY): Payer: Self-pay | Admitting: *Deleted

## 2016-11-28 NOTE — Telephone Encounter (Signed)
Pt spouse declined appt with afib clinic stating that pt is too weak at this point for all of these appts.  Stated she would have him keep appt with Estella Husk, PA and discuss options for afib treatment then.

## 2016-11-28 NOTE — Telephone Encounter (Signed)
Notified wife w/[facility] response...[name]

## 2016-11-29 DIAGNOSIS — Z87891 Personal history of nicotine dependence: Secondary | ICD-10-CM | POA: Diagnosis not present

## 2016-11-29 DIAGNOSIS — M1990 Unspecified osteoarthritis, unspecified site: Secondary | ICD-10-CM | POA: Diagnosis not present

## 2016-11-29 DIAGNOSIS — B953 Streptococcus pneumoniae as the cause of diseases classified elsewhere: Secondary | ICD-10-CM | POA: Diagnosis not present

## 2016-11-29 DIAGNOSIS — Z9981 Dependence on supplemental oxygen: Secondary | ICD-10-CM | POA: Diagnosis not present

## 2016-11-29 DIAGNOSIS — C61 Malignant neoplasm of prostate: Secondary | ICD-10-CM | POA: Diagnosis not present

## 2016-11-29 DIAGNOSIS — D630 Anemia in neoplastic disease: Secondary | ICD-10-CM | POA: Diagnosis not present

## 2016-11-29 DIAGNOSIS — I272 Pulmonary hypertension, unspecified: Secondary | ICD-10-CM | POA: Diagnosis not present

## 2016-11-29 DIAGNOSIS — R7881 Bacteremia: Secondary | ICD-10-CM | POA: Diagnosis not present

## 2016-11-29 DIAGNOSIS — I480 Paroxysmal atrial fibrillation: Secondary | ICD-10-CM | POA: Diagnosis not present

## 2016-11-29 DIAGNOSIS — I110 Hypertensive heart disease with heart failure: Secondary | ICD-10-CM | POA: Diagnosis not present

## 2016-11-29 DIAGNOSIS — I5032 Chronic diastolic (congestive) heart failure: Secondary | ICD-10-CM | POA: Diagnosis not present

## 2016-11-29 DIAGNOSIS — J449 Chronic obstructive pulmonary disease, unspecified: Secondary | ICD-10-CM | POA: Diagnosis not present

## 2016-11-30 DIAGNOSIS — B953 Streptococcus pneumoniae as the cause of diseases classified elsewhere: Secondary | ICD-10-CM | POA: Diagnosis not present

## 2016-11-30 DIAGNOSIS — R7881 Bacteremia: Secondary | ICD-10-CM | POA: Diagnosis not present

## 2016-11-30 DIAGNOSIS — D630 Anemia in neoplastic disease: Secondary | ICD-10-CM | POA: Diagnosis not present

## 2016-11-30 DIAGNOSIS — I480 Paroxysmal atrial fibrillation: Secondary | ICD-10-CM | POA: Diagnosis not present

## 2016-11-30 DIAGNOSIS — M1990 Unspecified osteoarthritis, unspecified site: Secondary | ICD-10-CM | POA: Diagnosis not present

## 2016-11-30 DIAGNOSIS — Z9981 Dependence on supplemental oxygen: Secondary | ICD-10-CM | POA: Diagnosis not present

## 2016-11-30 DIAGNOSIS — I110 Hypertensive heart disease with heart failure: Secondary | ICD-10-CM | POA: Diagnosis not present

## 2016-11-30 DIAGNOSIS — Z87891 Personal history of nicotine dependence: Secondary | ICD-10-CM | POA: Diagnosis not present

## 2016-11-30 DIAGNOSIS — J449 Chronic obstructive pulmonary disease, unspecified: Secondary | ICD-10-CM | POA: Diagnosis not present

## 2016-11-30 DIAGNOSIS — I272 Pulmonary hypertension, unspecified: Secondary | ICD-10-CM | POA: Diagnosis not present

## 2016-11-30 DIAGNOSIS — C61 Malignant neoplasm of prostate: Secondary | ICD-10-CM | POA: Diagnosis not present

## 2016-11-30 DIAGNOSIS — I5032 Chronic diastolic (congestive) heart failure: Secondary | ICD-10-CM | POA: Diagnosis not present

## 2016-12-02 ENCOUNTER — Encounter: Payer: Self-pay | Admitting: *Deleted

## 2016-12-02 ENCOUNTER — Ambulatory Visit (INDEPENDENT_AMBULATORY_CARE_PROVIDER_SITE_OTHER): Payer: Medicare Other | Admitting: Nurse Practitioner

## 2016-12-02 ENCOUNTER — Encounter: Payer: Self-pay | Admitting: Nurse Practitioner

## 2016-12-02 ENCOUNTER — Other Ambulatory Visit: Payer: Self-pay | Admitting: *Deleted

## 2016-12-02 ENCOUNTER — Other Ambulatory Visit (INDEPENDENT_AMBULATORY_CARE_PROVIDER_SITE_OTHER): Payer: Medicare Other

## 2016-12-02 VITALS — BP 98/60 | HR 75 | Temp 97.5°F | Ht 71.0 in | Wt 164.0 lb

## 2016-12-02 DIAGNOSIS — E43 Unspecified severe protein-calorie malnutrition: Secondary | ICD-10-CM

## 2016-12-02 DIAGNOSIS — D72829 Elevated white blood cell count, unspecified: Secondary | ICD-10-CM

## 2016-12-02 DIAGNOSIS — R600 Localized edema: Secondary | ICD-10-CM

## 2016-12-02 DIAGNOSIS — E46 Unspecified protein-calorie malnutrition: Secondary | ICD-10-CM | POA: Diagnosis not present

## 2016-12-02 DIAGNOSIS — F4321 Adjustment disorder with depressed mood: Secondary | ICD-10-CM

## 2016-12-02 DIAGNOSIS — R52 Pain, unspecified: Secondary | ICD-10-CM

## 2016-12-02 LAB — COMPREHENSIVE METABOLIC PANEL
ALK PHOS: 220 U/L — AB (ref 39–117)
ALT: 15 U/L (ref 0–53)
AST: 23 U/L (ref 0–37)
Albumin: 2.1 g/dL — ABNORMAL LOW (ref 3.5–5.2)
BILIRUBIN TOTAL: 0.4 mg/dL (ref 0.2–1.2)
BUN: 27 mg/dL — AB (ref 6–23)
CO2: 34 meq/L — AB (ref 19–32)
CREATININE: 0.82 mg/dL (ref 0.40–1.50)
Calcium: 8.3 mg/dL — ABNORMAL LOW (ref 8.4–10.5)
Chloride: 96 mEq/L (ref 96–112)
GFR: 97.75 mL/min (ref >60.00)
GLUCOSE: 97 mg/dL (ref 70–99)
Potassium: 4.1 mEq/L (ref 3.5–5.1)
SODIUM: 134 meq/L — AB (ref 135–145)
TOTAL PROTEIN: 5.6 g/dL — AB (ref 6.0–8.3)

## 2016-12-02 LAB — CBC WITH DIFFERENTIAL/PLATELET
BASOS ABS: 0.1 10*3/uL (ref 0.0–0.1)
Basophils Relative: 1.4 % (ref 0.0–3.0)
EOS ABS: 0.1 10*3/uL (ref 0.0–0.7)
Eosinophils Relative: 0.7 % (ref 0.0–5.0)
HCT: 38.4 % — ABNORMAL LOW (ref 39.0–52.0)
Hemoglobin: 12.3 g/dL — ABNORMAL LOW (ref 13.0–17.0)
LYMPHS ABS: 1.3 10*3/uL (ref 0.7–4.0)
LYMPHS PCT: 14.7 % (ref 12.0–46.0)
MCHC: 32 g/dL (ref 30.0–36.0)
MCV: 92.8 fl (ref 78.0–100.0)
MONOS PCT: 15.9 % — AB (ref 3.0–12.0)
Monocytes Absolute: 1.4 10*3/uL — ABNORMAL HIGH (ref 0.1–1.0)
NEUTROS ABS: 5.8 10*3/uL (ref 1.4–7.7)
NEUTROS PCT: 67.3 % (ref 43.0–77.0)
PLATELETS: 493 10*3/uL — AB (ref 150.0–400.0)
RBC: 4.14 Mil/uL — AB (ref 4.22–5.81)
RDW: 14.1 % (ref 11.5–15.5)
WBC: 8.6 10*3/uL (ref 4.0–10.5)

## 2016-12-02 MED ORDER — DULOXETINE HCL 30 MG PO CPEP: 30.0000 mg | ORAL_CAPSULE | Freq: Every day | ORAL | 0 refills | Status: DC

## 2016-12-02 NOTE — Patient Outreach (Signed)
[facility]) Care Management  12/02/2016  NICOLE HAFLEY April 26, 1946 [id number]   Transition of care (week 2)  RN spoke with both the pt who provided again a verbal to speak with his wife if needed. RN reintroduced [facility] and the purpose for today's call. Inquired on pt's HF monitoring as discussed on last week's discussion. Wife reported pt saw the primary provider today and her aware of pt's swelling to his lower extremity along with a mild cough with "alittle mucus" at times. Denies any acute issues and wife reports they have established a baseline weight at the doctor's office it was 164 lbs and compared to the home scales at 163.6 lbs. RN strongly encouraged pt and stress to the pt and wife the risk involved if HF was not monitor daily with ongoing fluid retention (pt verbalized an understanding).  RN encouraged both the wife that pt needs to weight daily and document all readings with the education on the heart failure zones if pt gained 3 lbs overnight or 5 lbs within one week to outreach to the provider along with any other symptoms noted. Further disussed the HF zones that pt is currently in with the YELLOW zone and to report any other symptoms to avoid acute problem. Although pt is in the YELLOW today he has  seen his provider who is aware. RN continued to extend [facility] services with community home visits for a more face-to-face education and teachings on this medical condition however pt remains receptive only to the ongoing transition of care calls over the next few weeks. Wife agreed to next Monday and RN will scheduled for the next follow up call to take plan on Monday. Will further inquired on pt's progress with daily weights and HF education. Note wife also indicated pt will consult with a dietitian based upon his provider's recommendations. Note plan of care reviewed with the goals as established on the last telephonic call. NO community resources needed at this time.  [name], RN Care Management Coordinator [place] Office [phone]

## 2016-12-02 NOTE — Progress Notes (Signed)
Subjective:  Patient ID: [name], male    DOB: May 24, 1945  Age: 71 y.o. MRN: [id number]  CC: Hospitalization Follow-up (hospital up--very weak,cant eat too good.)  HPI Admission:11/17/2016 Discharge: 11/24/2016. [name] was readmitted to hospital with multifocal pneumonia due to streptococcus pneumoniae. His hospitalization was complicated by anxiety and poor health, severe protein-calorie malnutrition, COPD, CHF and anasarca, and prostate cancer. [name] was treated with IV abx to oral abx, and duiretic. Right side loculated pleural effusion is to be monitored with CXR. Due to increased sedation with ativan use, it was recommended to avoid benzodiazepam. Discharged home with wife,and Home PT.  Follow up appts: Dr. Bonnell Public (cardiology, 12/09/2016), Dr. Prescott Gum (TCTS, 12/11/2016).  [name] complains of persistent Generalized pain and depressed mood due to frequent hospitalization and declining health. No SI or HI. Some improvement with use of tramadol at HS. [name] is having difficulty with no added salt diet. [name] is concerned about persistent edema. His wife encourages smal frequent meals, hight in protein. She also provide 1 serving of muscle milk with 50g protein. [name] will like to know if that is another way to increase albumin.  Reviewed lab results and radiology reports from hospitalization.  Medication reviewed with patient and wife Outpatient Medications Prior to Visit  Medication Sig Dispense Refill  . acetaminophen (TYLENOL) 325 MG tablet Take 2 tablets (650 mg total) by mouth every 6 (six) hours as needed for mild pain or fever.    [name] amoxicillin (AMOXIL) 500 MG capsule Take 1 capsule (500 mg total) by mouth 3 (three) times daily. 24 capsule 0  . Ascorbic Acid (VITAMIN C) 1000 MG tablet Take 1,000 mg by mouth every other day.     . feeding supplement, ENSURE ENLIVE, (ENSURE ENLIVE) LIQD Take 237 mLs by mouth 3 (three) times daily between meals.    . furosemide (LASIX) 40 MG tablet  Take 40 mg by mouth daily.     . rivaroxaban (XARELTO) 20 MG TABS tablet Take 1 tablet (20 mg total) by mouth daily with supper. 30 tablet 5  . spironolactone (ALDACTONE) 25 MG tablet Take 0.5 tablets (12.5 mg total) by mouth daily. 45 tablet 3  . traMADol (ULTRAM) 50 MG tablet Take 1 tablet (50 mg total) by mouth at bedtime as needed for moderate pain. 30 tablet 0   No facility-administered medications prior to visit.     ROS See HPI  Objective:  BP 98/60   Pulse 75   Temp (!) 97.5 F (36.4 C)   Ht _0  (1.803 m)   Wt 164 lb (74.4 kg)   SpO2 98%   BMI 22.87 kg/m   BP Readings from Last 3 Encounters:  12/02/16 98/60  11/24/16 (!) 93/58  11/15/16 (!) 102/58    Wt Readings from Last 3 Encounters:  12/02/16 164 lb (74.4 kg)  11/23/16 170 lb 4.8 oz (77.2 kg)  11/15/16 165 lb 6.4 oz (75 kg)    Physical Exam  Constitutional: [name] is oriented to person, place, and time. No distress.  Neck: Normal range of motion. Neck supple. No JVD present.  Cardiovascular: Normal rate and regular rhythm.   Pulmonary/Chest: Effort normal and breath sounds normal. [name] has no rales.  Abdominal: Soft. Bowel sounds are normal. [name] exhibits no distension. There is no tenderness.  Musculoskeletal: Normal range of motion. [name] exhibits edema.  Neurological: [name] is alert and oriented to person, place, and time.  Vitals reviewed.   Lab Results  Component Value Date  WBC 8.6 12/02/2016   HGB 12.3 (L) 12/02/2016   HCT 38.4 (L) 12/02/2016   PLT 493.0 (H) 12/02/2016   GLUCOSE 97 12/02/2016   ALT 15 12/02/2016   AST 23 12/02/2016   NA 134 (L) 12/02/2016   K 4.1 12/02/2016   CL 96 12/02/2016   CREATININE 0.82 12/02/2016   BUN 27 (H) 12/02/2016   CO2 34 (H) 12/02/2016   TSH 1.662 11/18/2016   PSA 1.7 11/01/2016   INR 3.68 11/18/2016    Dg Chest 1 View  Result Date: 11/18/2016 CLINICAL DATA:  Followup thoracentesis. EXAM: CHEST 1 VIEW COMPARISON:  Earlier same day FINDINGS: Bilateral pleural  effusions. Less pleural fluid seen on the right when compared to the immediate prior film. Some loculation does persist. Possible small amount of air in a loculation projected over the lower chest. No free pneumothorax. No other change. IMPRESSION: Less pleural density on the right than seen earlier. Persistent bilateral pleural effusions. Loculations on the right. Possible small amount of air within a loculation projected over the lower chest. No free pneumothorax. Electronically Signed   By: Nelson Chimes M.D.   On: 11/18/2016 16:10   Ct Chest Wo Contrast  Result Date: 11/18/2016 CLINICAL DATA:  71 year old male with pleural effusion. History of prostate cancer and thoracic adenopathy. EXAM: CT CHEST WITHOUT CONTRAST TECHNIQUE: Multidetector CT imaging of the chest was performed following the standard protocol without IV contrast. COMPARISON:  Chest radiograph dated 11/18/2016 FINDINGS: Evaluation of this exam is limited in the absence of intravenous contrast. Cardiovascular: There is no cardiomegaly. No significant pericardial effusion. Coronary vascular calcification primarily involving the LAD and RCA. There is moderate atherosclerotic calcification of the thoracic aorta. Mediastinum/Nodes: Calcified hilar and mediastinal lymph nodes/granuloma. There is fullness of the hilar region which may represent mildly enlarged lymph nodes. Subcarinal mass/adenopathy or conglomerate of large lymph nodes with a combined dimension of approximately 5.5 x 5.7 cm (previously 5.3 x 4.3 cm on CT of 10/02/2016). Evaluation and comparison however is somewhat limited in the absence of contrast. Prevascular space adenopathy measures 14 mm in short axis. Grossly stable retrotracheal adenopathy as well as multiple enlarged lymph nodes in the AP window. The esophagus is grossly unremarkable as visualized. Lungs/Pleura: Small left and moderate right pleural effusion have increased in size compared to prior study. The right pleural  effusion appears loculated and extends into the fissure. There is associated partial compressive atelectasis of the lower lobes. Superimposed infiltrate is not excluded. There is background of emphysema. Evaluation of the previously described pulmonary nodules is limited due to increased pleural effusion and associated atelectasis or infiltrate. Upper Abdomen: The visualized upper abdomen is grossly unremarkable. Musculoskeletal: There is diffuse subcutaneous edema and anasarca. Scoliosis with degenerative changes of the spine. No acute fracture. IMPRESSION: 1. Increase in the size of the pleural effusion compared to the prior study with associated partial compressive atelectasis of the lower lobes. Pneumonia is not excluded. Clinical correlation is recommended. 2. Moderate emphysema. 3. Bulky mediastinal and mild bilateral axillary adenopathy. Evaluation of the lymph nodes is somewhat limited on this noncontrast study. 4. Diffuse soft tissue edema and anasarca. 5. Aortic Atherosclerosis (ICD10-I70.0) and Emphysema (ICD10-J43.9). 6. Electronically Signed   By: Anner Crete M.D.   On: 11/18/2016 05:21   Dg Chest Port 1 View  Result Date: 11/18/2016 CLINICAL DATA:  Leukocytosis and leg swelling. History of COPD, chronic bronchitis and prostate cancer. EXAM: PORTABLE CHEST 1 VIEW COMPARISON:  PET-CT October 31, 2016 and chest radiograph  October 22, 2016 FINDINGS: Enlarging small to moderate RIGHT and small LEFT pleural effusions. Patchy consolidation projecting RIGHT upper lung zone in bilateral lower lung zone with diffuse interstitial prominence, increased from prior examination. Hyperinflation. Cardiac silhouette is mildly enlarged, calcified aortic knob. a increasing fullness of the LEFT aortopulmonary window corresponding to lymphadenopathy on prior CT chest. RIGHT upper lobe calcified granuloma. No pneumothorax. Broad dextroscoliosis with calcified mediastinal lymph nodes. IMPRESSION: Multifocal consolidation  with increasing interstitial prominence concerning for bronchopneumonia in a background of COPD. Enlarging small to moderate small LEFT pleural effusions. Followup PA and lateral chest X-ray is recommended in 3-4 weeks following trial of antibiotic therapy to ensure resolution and exclude underlying malignancy. Mild cardiomegaly. Electronically Signed   By: Elon Alas M.D.   On: 11/18/2016 01:46   Ir Thoracentesis Asp Pleural Space W/img Guide  Result Date: 11/18/2016 INDICATION: Bilateral pleural effusions EXAM: ULTRASOUND GUIDED RIGHT THORACENTESIS MEDICATIONS: None. COMPLICATIONS: None immediate. PROCEDURE: An ultrasound guided thoracentesis was thoroughly discussed with the patient and questions answered. The benefits, risks, alternatives and complications were also discussed. The patient understands and wishes to proceed with the procedure. Written consent was obtained. Ultrasound was performed to localize and mark an adequate pocket of fluid in the right chest. The area was then prepped and draped in the normal sterile fashion. 1% Lidocaine was used for local anesthesia. Under ultrasound guidance a Safe-T-Centesis catheter was introduced. Thoracentesis was performed. The catheter was removed and a dressing applied. FINDINGS: A total of approximately 20 cc of clear yellow fluid was removed. Samples were sent to the laboratory as requested by the clinical team. IMPRESSION: Successful ultrasound guided right thoracentesis yielding 20 cc of pleural fluid. Electronically Signed   By: Marybelle Killings M.D.   On: 11/18/2016 16:31    Assessment & Plan:   [name] was seen today for hospitalization follow-up.  Diagnoses and all orders for this visit:  Protein-calorie malnutrition, severe ([place]) -     Comprehensive metabolic panel; Future -     Amb ref to Medical Nutrition Therapy-MNT  Bilateral lower extremity edema  Leukocytosis, unspecified type -     CBC w/Diff; Future  Adjustment disorder with  depressed mood -     DULoxetine (CYMBALTA) 30 MG capsule; Take 1 capsule (30 mg total) by mouth daily.  Generalized pain -     DULoxetine (CYMBALTA) 30 MG capsule; Take 1 capsule (30 mg total) by mouth daily.  Hypoalbuminemia due to protein-calorie malnutrition ([facility]) -     Amb ref to Medical Nutrition Therapy-MNT   I have discontinued [name]'s traMADol. I am also having him start on DULoxetine. Additionally, I am having him maintain his vitamin C, rivaroxaban, furosemide, spironolactone, acetaminophen, feeding supplement (ENSURE ENLIVE), and amoxicillin.  Meds ordered this encounter  Medications  . DULoxetine (CYMBALTA) 30 MG capsule    Sig: Take 1 capsule (30 mg total) by mouth daily.    Dispense:  30 capsule    Refill:  0    Order Specific Question:   Supervising Provider    Answer:   Cassandria Anger [[id number]]    Follow-up: Return in about 4 weeks (around 12/30/2016).  Wilfred Lacy, NP

## 2016-12-02 NOTE — Patient Instructions (Addendum)
Go to basement for blood draw.  You will be contacted to schedule appt with dietician.  High-Protein and High-Calorie Diet Eating high-protein and high-calorie foods can help you to gain weight, heal after an injury, and recover after an illness or surgery. What is my plan? The specific amount of daily protein and calories you need depends on:  Your body weight.  The reason this diet is recommended for you.  Generally, a high-protein, high-calorie diet involves:  Eating 250-500 extra calories each day.  Making sure that 10-35% of your daily calories come from protein.  Talk to your health care provider about how much protein and how many calories you need each day. Follow the diet as directed by your health care provider. What do I need to know about this diet?  Ask your health care provider if you should take a nutritional supplement.  Try to eat six small meals each day instead of three large meals.  Eat a balanced diet, including one food that is high in protein at each meal.  Keep nutritious snacks handy, such as nuts, trail mixes, dried fruit, and yogurt.  If you have kidney disease or diabetes, eating too much protein may put extra stress on your kidneys. Talk to your health care provider if you have either of those conditions. What are some high-protein foods? Grains Quinoa. Bulgur wheat. Vegetables Soybeans. Peas. Meats and Other Protein Sources Beef, pork, and poultry. Fish and seafood. Eggs. Tofu. Textured vegetable protein (TVP). Peanut butter. Nuts and seeds. Dried beans. Protein powders. Dairy Whole milk. Whole-milk yogurt. Powdered milk. Cheese. [employer]. Eggnog. Beverages High-protein supplement drinks. Soy milk. Other Protein bars. The items listed above may not be a complete list of recommended foods or beverages. Contact your dietitian for more options. What are some high-calorie foods? Grains Pasta. Quick breads. Muffins. Pancakes. Ready-to-eat  cereal. Vegetables Vegetables cooked in oil or butter. Fried potatoes. Fruits Dried fruit. Fruit leather. Canned fruit in syrup. Fruit juice. Avocados. Meats and Other Protein Sources Peanut butter. Nuts and seeds. Dairy Heavy cream. Whipped cream. Cream cheese. Sour cream. Ice cream. Custard. Pudding. Beverages Meal-replacement beverages. Nutrition shakes. Fruit juice. Sugar-sweetened soft drinks. Condiments Salad dressing. Mayonnaise. Alfredo sauce. Fruit preserves or jelly. Honey. Syrup. Sweets/Desserts Cake. Cookies. Pie. Pastries. Candy bars. Chocolate. Fats and Oils Butter or margarine. Oil. Gravy. Other Meal-replacement bars. The items listed above may not be a complete list of recommended foods or beverages. Contact your dietitian for more options. What are some tips for including high-protein and high-calorie foods in my diet?  Add whole milk, half-and-half, or heavy cream to cereal, pudding, soup, or hot cocoa.  Add whole milk to instant breakfast drinks.  Add peanut butter to oatmeal or smoothies.  Add powdered milk to baked goods, smoothies, or milkshakes.  Add powdered milk, cream, or butter to mashed potatoes.  Add cheese to cooked vegetables.  Make whole-milk yogurt parfaits. Top them with granola, fruit, or nuts.  Add cottage cheese to your fruit.  Add avocados, cheese, or both to sandwiches or salads.  Add meat, poultry, or seafood to rice, pasta, casseroles, salads, and soups.  Use mayonnaise when making egg salad, chicken salad, or tuna salad.  Use peanut butter as a topping for pretzels, celery, or crackers.  Add beans to casseroles, dips, and spreads.  Add pureed beans to sauces and soups.  Replace calorie-free drinks with calorie-containing drinks, such as milk and fruit juice. This information is not intended to replace advice given to  you by your health care provider. Make sure you discuss any questions you have with your health care  provider. Document Released: 04/29/2005 Document Revised: 10/05/2015 Document Reviewed: 10/12/2013 Elsevier Interactive Patient Education  [employer].

## 2016-12-03 ENCOUNTER — Ambulatory Visit: Payer: Medicare Other | Admitting: *Deleted

## 2016-12-03 DIAGNOSIS — F4321 Adjustment disorder with depressed mood: Secondary | ICD-10-CM | POA: Insufficient documentation

## 2016-12-03 NOTE — Assessment & Plan Note (Signed)
We discussed possible use of albumin transfusion, but patient and wife decided not to use this because it is a blood product. They elected to continue with hight protein diet at this time. Today's CMP indicates and improvement in albumin and protein.  CMP     Component Value Date/Time   NA 134 (L) 12/02/2016 1057   NA 138 10/04/2016 1134   K 4.1 12/02/2016 1057   CL 96 12/02/2016 1057   CO2 34 (H) 12/02/2016 1057   GLUCOSE 97 12/02/2016 1057   BUN 27 (H) 12/02/2016 1057   BUN 31 (H) 10/04/2016 1134   CREATININE 0.82 12/02/2016 1057   CREATININE 0.84 06/12/2014 0922   CALCIUM 8.3 (L) 12/02/2016 1057   PROT 5.6 (L) 12/02/2016 1057   ALBUMIN 2.1 (L) 12/02/2016 1057   AST 23 12/02/2016 1057   ALT 15 12/02/2016 1057   ALKPHOS 220 (H) 12/02/2016 1057   BILITOT 0.4 12/02/2016 1057   GFRNONAA >60 11/23/2016 0422   GFRAA >60 11/23/2016 0422

## 2016-12-03 NOTE — Assessment & Plan Note (Signed)
Start cymbalta. Reassess in 4weeks. Avoid benzodiazepine due to increase somnolence during hospitalization

## 2016-12-04 ENCOUNTER — Ambulatory Visit: Payer: Medicare Other | Admitting: *Deleted

## 2016-12-04 DIAGNOSIS — I110 Hypertensive heart disease with heart failure: Secondary | ICD-10-CM | POA: Diagnosis not present

## 2016-12-04 DIAGNOSIS — R7881 Bacteremia: Secondary | ICD-10-CM | POA: Diagnosis not present

## 2016-12-04 DIAGNOSIS — M1990 Unspecified osteoarthritis, unspecified site: Secondary | ICD-10-CM | POA: Diagnosis not present

## 2016-12-04 DIAGNOSIS — C61 Malignant neoplasm of prostate: Secondary | ICD-10-CM | POA: Diagnosis not present

## 2016-12-04 DIAGNOSIS — Z9981 Dependence on supplemental oxygen: Secondary | ICD-10-CM | POA: Diagnosis not present

## 2016-12-04 DIAGNOSIS — I480 Paroxysmal atrial fibrillation: Secondary | ICD-10-CM | POA: Diagnosis not present

## 2016-12-04 DIAGNOSIS — I5032 Chronic diastolic (congestive) heart failure: Secondary | ICD-10-CM | POA: Diagnosis not present

## 2016-12-04 DIAGNOSIS — Z87891 Personal history of nicotine dependence: Secondary | ICD-10-CM | POA: Diagnosis not present

## 2016-12-04 DIAGNOSIS — I272 Pulmonary hypertension, unspecified: Secondary | ICD-10-CM | POA: Diagnosis not present

## 2016-12-04 DIAGNOSIS — B953 Streptococcus pneumoniae as the cause of diseases classified elsewhere: Secondary | ICD-10-CM | POA: Diagnosis not present

## 2016-12-04 DIAGNOSIS — D630 Anemia in neoplastic disease: Secondary | ICD-10-CM | POA: Diagnosis not present

## 2016-12-04 DIAGNOSIS — J449 Chronic obstructive pulmonary disease, unspecified: Secondary | ICD-10-CM | POA: Diagnosis not present

## 2016-12-05 DIAGNOSIS — I110 Hypertensive heart disease with heart failure: Secondary | ICD-10-CM | POA: Diagnosis not present

## 2016-12-05 DIAGNOSIS — I272 Pulmonary hypertension, unspecified: Secondary | ICD-10-CM | POA: Diagnosis not present

## 2016-12-05 DIAGNOSIS — Z9981 Dependence on supplemental oxygen: Secondary | ICD-10-CM | POA: Diagnosis not present

## 2016-12-05 DIAGNOSIS — C61 Malignant neoplasm of prostate: Secondary | ICD-10-CM | POA: Diagnosis not present

## 2016-12-05 DIAGNOSIS — Z87891 Personal history of nicotine dependence: Secondary | ICD-10-CM | POA: Diagnosis not present

## 2016-12-05 DIAGNOSIS — R7881 Bacteremia: Secondary | ICD-10-CM | POA: Diagnosis not present

## 2016-12-05 DIAGNOSIS — D630 Anemia in neoplastic disease: Secondary | ICD-10-CM | POA: Diagnosis not present

## 2016-12-05 DIAGNOSIS — I5032 Chronic diastolic (congestive) heart failure: Secondary | ICD-10-CM | POA: Diagnosis not present

## 2016-12-05 DIAGNOSIS — J449 Chronic obstructive pulmonary disease, unspecified: Secondary | ICD-10-CM | POA: Diagnosis not present

## 2016-12-05 DIAGNOSIS — I480 Paroxysmal atrial fibrillation: Secondary | ICD-10-CM | POA: Diagnosis not present

## 2016-12-05 DIAGNOSIS — B953 Streptococcus pneumoniae as the cause of diseases classified elsewhere: Secondary | ICD-10-CM | POA: Diagnosis not present

## 2016-12-05 DIAGNOSIS — M1990 Unspecified osteoarthritis, unspecified site: Secondary | ICD-10-CM | POA: Diagnosis not present

## 2016-12-06 ENCOUNTER — Telehealth: Payer: Self-pay | Admitting: Nurse Practitioner

## 2016-12-06 NOTE — Telephone Encounter (Signed)
[facility] [phone]  Need verbals Skilled nursing 1 week 5  Medical Social worker PT  OT  Speak therapy

## 2016-12-06 NOTE — Telephone Encounter (Signed)
Notified Nicolette gave [place] response...lmb

## 2016-12-06 NOTE — Telephone Encounter (Signed)
ok 

## 2016-12-09 ENCOUNTER — Encounter: Payer: Self-pay | Admitting: Physician Assistant

## 2016-12-09 ENCOUNTER — Ambulatory Visit (INDEPENDENT_AMBULATORY_CARE_PROVIDER_SITE_OTHER): Payer: Medicare Other | Admitting: Physician Assistant

## 2016-12-09 ENCOUNTER — Other Ambulatory Visit: Payer: Self-pay | Admitting: *Deleted

## 2016-12-09 VITALS — BP 100/54 | HR 71 | Ht 71.0 in | Wt 159.4 lb

## 2016-12-09 DIAGNOSIS — I5032 Chronic diastolic (congestive) heart failure: Secondary | ICD-10-CM | POA: Diagnosis not present

## 2016-12-09 DIAGNOSIS — I4819 Other persistent atrial fibrillation: Secondary | ICD-10-CM

## 2016-12-09 DIAGNOSIS — I481 Persistent atrial fibrillation: Secondary | ICD-10-CM

## 2016-12-09 DIAGNOSIS — E43 Unspecified severe protein-calorie malnutrition: Secondary | ICD-10-CM | POA: Diagnosis not present

## 2016-12-09 DIAGNOSIS — J431 Panlobular emphysema: Secondary | ICD-10-CM | POA: Diagnosis not present

## 2016-12-09 DIAGNOSIS — I272 Pulmonary hypertension, unspecified: Secondary | ICD-10-CM | POA: Diagnosis not present

## 2016-12-09 NOTE — Patient Instructions (Addendum)
Medication Instructions:  Your physician recommends that you continue on your current medications as directed. Please refer to the Current Medication list given to you today.   Labwork: NONE ORDERED TODAY  Testing/Procedures: NONE ORDERED TODAY  Follow-Up: 1. 03/18/17 @ 1:20 WITH DR. Radford Pax   2. YOU ARE BEING REFERRED TO [facility]; DR. Lake Bells ; THEIR OFFICE WILL CALL YOU WITH AN APPT.   Any Other Special Instructions Will Be Listed Below (If Applicable). If you need a refill on your cardiac medications before your next appointment, please call your pharmacy.   Low-Sodium Eating Plan Sodium, which is an element that makes up salt, helps you maintain a healthy balance of fluids in your body. Too much sodium can increase your blood pressure and cause fluid and waste to be held in your body. Your health care provider or dietitian may recommend following this plan if you have high blood pressure (hypertension), kidney disease, liver disease, or heart failure. Eating less sodium can help lower your blood pressure, reduce swelling, and protect your heart, liver, and kidneys. What are tips for following this plan? General guidelines  Most people on this plan should limit their sodium intake to 1,500-2,000 mg (milligrams) of sodium each day. Reading food labels  The Nutrition Facts label lists the amount of sodium in one serving of the food. If you eat more than one serving, you must multiply the listed amount of sodium by the number of servings.  Choose foods with less than 140 mg of sodium per serving.  Avoid foods with 300 mg of sodium or more per serving. Shopping  Look for lower-sodium products, often labeled as "low-sodium" or "no salt added."  Always check the sodium content even if foods are labeled as "unsalted" or "no salt added".  Buy fresh foods. ? Avoid canned foods and premade or frozen meals. ? Avoid canned, cured, or processed meats  Buy breads that have  less than 80 mg of sodium per slice. Cooking  Eat more home-cooked food and less restaurant, buffet, and fast food.  Avoid adding salt when cooking. Use salt-free seasonings or herbs instead of table salt or sea salt. Check with your health care provider or pharmacist before using salt substitutes.  Cook with plant-based oils, such as canola, sunflower, or olive oil. Meal planning  When eating at a restaurant, ask that your food be prepared with less salt or no salt, if possible.  Avoid foods that contain MSG (monosodium glutamate). MSG is sometimes added to [place] food, bouillon, and some canned foods. What foods are recommended? The items listed may not be a complete list. Talk with your dietitian about what dietary choices are best for you. Grains Low-sodium cereals, including oats, puffed wheat and rice, and shredded wheat. Low-sodium crackers. Unsalted rice. Unsalted pasta. Low-sodium bread. Whole-grain breads and whole-grain pasta. Vegetables Fresh or frozen vegetables. "No salt added" canned vegetables. "No salt added" tomato sauce and paste. Low-sodium or reduced-sodium tomato and vegetable juice. Fruits Fresh, frozen, or canned fruit. Fruit juice. Meats and other protein foods Fresh or frozen (no salt added) meat, poultry, seafood, and fish. Low-sodium canned tuna and salmon. Unsalted nuts. Dried peas, beans, and lentils without added salt. Unsalted canned beans. Eggs. Unsalted nut butters. Dairy Milk. Soy milk. Cheese that is naturally low in sodium, such as ricotta cheese, fresh mozzarella, or Swiss cheese Low-sodium or reduced-sodium cheese. Cream cheese. Yogurt. Fats and oils Unsalted butter. Unsalted margarine with no trans fat. Vegetable oils such as canola or  olive oils. Seasonings and other foods Fresh and dried herbs and spices. Salt-free seasonings. Low-sodium mustard and ketchup. Sodium-free salad dressing. Sodium-free light mayonnaise. Fresh or refrigerated  horseradish. Lemon juice. Vinegar. Homemade, reduced-sodium, or low-sodium soups. Unsalted popcorn and pretzels. Low-salt or salt-free chips. What foods are not recommended? The items listed may not be a complete list. Talk with your dietitian about what dietary choices are best for you. Grains Instant hot cereals. Bread stuffing, pancake, and biscuit mixes. Croutons. Seasoned rice or pasta mixes. Noodle soup cups. Boxed or frozen macaroni and cheese. Regular salted crackers. Self-rising flour. Vegetables Sauerkraut, pickled vegetables, and relishes. Olives. [place] fries. Onion rings. Regular canned vegetables (not low-sodium or reduced-sodium). Regular canned tomato sauce and paste (not low-sodium or reduced-sodium). Regular tomato and vegetable juice (not low-sodium or reduced-sodium). Frozen vegetables in sauces. Meats and other protein foods Meat or fish that is salted, canned, smoked, spiced, or pickled. Bacon, ham, sausage, hotdogs, corned beef, chipped beef, packaged lunch meats, salt pork, jerky, pickled herring, anchovies, regular canned tuna, sardines, salted nuts. Dairy Processed cheese and cheese spreads. Cheese curds. Blue cheese. Feta cheese. String cheese. Regular cottage cheese. Buttermilk. Canned milk. Fats and oils Salted butter. Regular margarine. Ghee. Bacon fat. Seasonings and other foods Onion salt, garlic salt, seasoned salt, table salt, and sea salt. Canned and packaged gravies. Worcestershire sauce. Tartar sauce. Barbecue sauce. Teriyaki sauce. Soy sauce, including reduced-sodium. Steak sauce. Fish sauce. Oyster sauce. Cocktail sauce. Horseradish that you find on the shelf. Regular ketchup and mustard. Meat flavorings and tenderizers. Bouillon cubes. Hot sauce and Tabasco sauce. Premade or packaged marinades. Premade or packaged taco seasonings. Relishes. Regular salad dressings. Salsa. Potato and tortilla chips. Corn chips and puffs. Salted popcorn and pretzels. Canned or  dried soups. Pizza. Frozen entrees and pot pies. Summary  Eating less sodium can help lower your blood pressure, reduce swelling, and protect your heart, liver, and kidneys.  Most people on this plan should limit their sodium intake to 1,500-2,000 mg (milligrams) of sodium each day.  Canned, boxed, and frozen foods are high in sodium. Restaurant foods, fast foods, and pizza are also very high in sodium. You also get sodium by adding salt to food.  Try to cook at home, eat more fresh fruits and vegetables, and eat less fast food, canned, processed, or prepared foods. This information is not intended to replace advice given to you by your health care provider. Make sure you discuss any questions you have with your health care provider. Document Released: 10/19/2001 Document Revised: 04/22/2016 Document Reviewed: 04/22/2016 Elsevier Interactive Patient Education  2017 [employer].

## 2016-12-09 NOTE — Progress Notes (Signed)
Cardiology Office Note    Date:  12/09/2016   ID:  [name], DOB May 27, 1945, MRN [id number]  PCP:  Flossie Buffy, NP  Cardiologist: Dr. Radford Pax  Chief Complaint  Patient presents with  . Transitions Of Care    History of Present Illness:  [name] is a 71 y.o. male history of PAF on Xarelto status post DCCV 4/0/98, diastolic CHF LVEF 11-91% on echo 05/14/16 COPD with pulmonary hypertension, right VATS 05/20/16 for empyema with a concern for underlying lung CA lymph node biopsy 10/22/16 negative for CA. He saw Dr. Lucianne Lei tried him follow-up and was noted to be in A. fib and was hypotensive. His diuretics were cut back and he then developed leg edema. His Aldactone was not resumed.  Patient saw Kerin Ransom, [place] 11/15/16 at which time he was not feeling well. He was unable to tell when he was in atrial fibrillation. He had persistent anorexia. Aldactone 12.5 mg daily was resumed.  Patient was readmitted to the hospital 11/17/16 with bacteremia due to Streptococcus pneumonia. Patient also had anasarca and was treated with IV Lasix. Weight decreased from 184 pounds to 170 pounds. 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 2-D echo was done and LVEF was 55-60% moderately to severely dilated right atrium moderate TR and mild to moderately increased pulmonary pressures. Multiple phone notes since then with increase in lasix 40 mg bid 3 days.  Patient comes in today for f/u accompanied by his wife. Weight 159. No appetite.Patient was sent home on oxygen and would like to be weaned off of it. He is working with physical therapy but is extremely weak. His edema has improved and he is back on Lasix 40 mg once daily. Hasn't had any rapid heart rates.  Past Medical History:  Diagnosis Date  . Anxiety   . Arthritis    "neck" (08/15/2015)  . Chronic bronchitis ([place])   . Chronic diastolic CHF (congestive heart failure) ([place])   . Constipation   . COPD  (chronic obstructive pulmonary disease) ([place])   . DDD (degenerative disc disease), cervical   . Depression   . Dilated aortic root ([place])    46m on echo 05/2016  . Dysrhythmia    A-Fib  cardioverted on 10-11-2016  . Edema of left lower extremity   . Facial basal cell cancer   . H/O acne vulgaris 1960s   [facility]  . Headache    history of - left temporal- years ago- not current (08/15/2015)  . Persistent atrial fibrillation (HCC)    on coumadin with CHADS2VASC score of 2  . Pneumonia 1960s; 2015 X 2  . Prostate cancer ([facility] dx'd early 2000s   "low spreading; non aggressive type" (08/15/2015)  . Pulmonary HTN ([facility]    PASP 477mg by echo 05/2016  . Skin cancer    "back"    Past Surgical History:  Procedure Laterality Date  . CARDIOVERSION N/A 10/11/2016   Procedure: CARDIOVERSION;  Surgeon: CrSanda KleinMD;  [facility];  Service: Cardiovascular;  Laterality: N/A;  . COLONOSCOPY    . EXCISIONAL HEMORRHOIDECTOMY  1960s  . INGUINAL HERNIA REPAIR Right 2002  . INGUINAL HERNIA REPAIR Bilateral 08/15/2015  . INGUINAL HERNIA REPAIR Bilateral 08/15/2015   Procedure: OPEN REPAIR RECURRENT RIGHT INGUINAL HERNIA WITH MESH AND REPAIR LEFT INGUINAL HERNIA WITH MESH;  Surgeon: HaFanny SkatesMD;  [facility] Service:  General;  Laterality: Bilateral;  . INSERTION OF MESH Bilateral 08/15/2015   Procedure: INSERTION OF MESH;  Surgeon: [name], MD;  Location: [facility];  Service: General;  Laterality: Bilateral;  . IR THORACENTESIS ASP PLEURAL SPACE W/IMG GUIDE  11/18/2016  . LYMPH NODE BIOPSY Bilateral 10/22/2016   Procedure: LEFT NECK LYMPH NODE BIOPSY;  Surgeon: [name], MD;  Location: [facility];  Service: Thoracic;  Laterality: Bilateral;  . PROSTATE BIOPSY    . TONSILLECTOMY  1950s  . VIDEO ASSISTED THORACOSCOPY (VATS)/EMPYEMA Right 05/20/2016   Procedure: VIDEO ASSISTED THORACOSCOPY (VATS) with drainage of pleural effusion;  Surgeon:  [name], MD;  Location: [facility];  Service: Thoracic;  Laterality: Right;  [name] VIDEO BRONCHOSCOPY WITH ENDOBRONCHIAL ULTRASOUND Right 05/20/2016   Procedure: VIDEO BRONCHOSCOPY WITH ENDOBRONCHIAL ULTRASOUND;  Surgeon: [name], MD;  Location: [facility] OR;  Service: Thoracic;  Laterality: Right;    Current Medications: Current Meds  Medication Sig  . acetaminophen (TYLENOL) 325 MG tablet Take 2 tablets (650 mg total) by mouth every 6 (six) hours as needed for mild pain or fever.  . Ascorbic Acid (VITAMIN C) 1000 MG tablet Take 1,000 mg by mouth every other day.   . DULoxetine (CYMBALTA) 30 MG capsule Take 1 capsule (30 mg total) by mouth daily.  . furosemide (LASIX) 40 MG tablet Take 40 mg by mouth daily.   . rivaroxaban (XARELTO) 20 MG TABS tablet Take 1 tablet (20 mg total) by mouth daily with supper.  [name] spironolactone (ALDACTONE) 25 MG tablet Take 0.5 tablets (12.5 mg total) by mouth daily.  . traMADol (ULTRAM) 50 MG tablet Take 50 mg by mouth at bedtime.     Allergies:   Demerol [meperidine] and Oxycodone hcl   Social History   Social History  . Marital status: Married    Spouse name: N/A  . Number of children: N/A  . Years of education: N/A   Social History Main Topics  . Smoking status: Former Smoker    Packs/day: 1.00    Years: 62.00    Types: Cigarettes    Quit date: 05/14/2016  . Smokeless tobacco: Never Used  . Alcohol use No  . Drug use: No  . Sexual activity: Not Currently   Other Topics Concern  . None   Social History Narrative  . None     Family History:  The patient's family history includes Alcohol abuse in his father; Alcoholism in his father; CAD in his father; Cancer in his mother; Ovarian cancer in his sister.   ROS:   Please see the history of present illness.    Review of Systems  Constitution: Positive for weakness, malaise/fatigue and weight loss.  HENT: Negative.   Cardiovascular: Positive for dyspnea on exertion, irregular heartbeat and  leg swelling.  Respiratory: Negative.   Endocrine: Negative.   Hematologic/Lymphatic: Negative.   Musculoskeletal: Negative.   Gastrointestinal: Negative.   Genitourinary: Negative.    All other systems reviewed and are negative.   PHYSICAL EXAM:   VS:  BP (!) 100/54   Pulse 71   Ht _0  (1.803 m)   Wt 159 lb 6.4 oz (72.3 kg)   SpO2 90%   BMI 22.23 kg/m   Physical Exam  GEN: Thin, in wheelchair, on oxygen, in no acute distress  Neck: no JVD, carotid bruits, or masses Cardiac: Irregular irregular 1/6 systolic murmur at the left sternal border Respiratory:  Decreased breath sounds at the right lung base, clear elsewhere GI:  soft, nontender, nondistended, + BS Ext: without cyanosis, clubbing, or edema Neuro:  Alert and Oriented x 3 Psych: euthymic mood, full affect  Wt Readings from Last 3 Encounters:  12/09/16 159 lb 6.4 oz (72.3 kg)  12/02/16 164 lb (74.4 kg)  11/23/16 170 lb 4.8 oz (77.2 kg)      Studies/Labs Reviewed:   EKG:  EKG is  ordered today.   Atrial fibrillation at 71 bpm incomplete right bundle branch block, no acute change  Recent Labs: 11/18/2016: TSH 1.662 11/20/2016: Magnesium 1.9 11/22/2016: B Natriuretic Peptide 678.9 12/02/2016: ALT 15; BUN 27; Creatinine, Ser 0.82; Hemoglobin 12.3; Platelets 493.0; Potassium 4.1; Sodium 134   Lipid Panel No results found for: CHOL, TRIG, HDL, CHOLHDL, VLDL, LDLCALC, LDLDIRECT  Additional studies/ records that were reviewed today include:   2-D echo 11/19/16: Study Conclusions   - Left ventricle: The cavity size was normal. Wall thickness was   normal. Systolic function was normal. The estimated ejection   fraction was in the range of 55% to 60%. Wall motion was normal;   there were no regional wall motion abnormalities. - Mitral valve: Calcified annulus. - Left atrium: The atrium was moderately dilated. - Right ventricle: The cavity size was at least mildly dilated. - Right atrium: The atrium was moderately to  severely dilated. - Tricuspid valve: There was moderate regurgitation directed   eccentrically. - Pulmonary arteries: Systolic pressure was mildly to moderately   increased. PA peak pressure: 47 mm Hg (S). - Pericardium, extracardiac: There was a left pleural effusion.       ASSESSMENT:    1. Chronic diastolic CHF (congestive heart failure) (HCC)   2. Persistent atrial fibrillation ([place])   3. Pulmonary HTN ([place])   4. Panlobular emphysema ([place])   5. Protein-calorie malnutrition, severe (HCC)      PLAN:  In order of problems listed above:  Chronic diastolic CHF currently compensated on Lasix 40 mg once daily and spironolactone 12.5 mg daily. He does enjoy salt. 2 g sodium diet given Persistent atrial fibrillation on Xarelto. Referred to A. fib clinic but patient feels too weak to pursue this at this time.  Pulmonary hypertension and emphysema does not have a pulmonologist. Will make referral. Also has appt with Dr. Nils Pyle on Wed. he will have a chest x-ray that day and can discuss weaning of O2.  Protein calorie malnutrition severe with no appetite. Recommend he speak with the dietitian.     Medication Adjustments/Labs and Tests Ordered: Current medicines are reviewed at length with the patient today.  Concerns regarding medicines are outlined above.  Medication changes, Labs and Tests ordered today are listed in the Patient Instructions below. Patient Instructions  Medication Instructions:  Your physician recommends that you continue on your current medications as directed. Please refer to the Current Medication list given to you today.   Labwork: NONE ORDERED TODAY  Testing/Procedures: NONE ORDERED TODAY  Follow-Up: 03/18/17 @ 1:20 WITH DR. Radford Pax   Any Other Special Instructions Will Be Listed Below (If Applicable). If you need a refill on your cardiac medications before your next appointment, please call your pharmacy.   Low-Sodium Eating Plan Sodium, which  is an element that makes up salt, helps you maintain a healthy balance of fluids in your body. Too much sodium can increase your blood pressure and cause fluid and waste to be held in your body. Your health care provider or dietitian may recommend following this plan if you have high blood pressure (hypertension),  kidney disease, liver disease, or heart failure. Eating less sodium can help lower your blood pressure, reduce swelling, and protect your heart, liver, and kidneys. What are tips for following this plan? General guidelines  Most people on this plan should limit their sodium intake to 1,500-2,000 mg (milligrams) of sodium each day. Reading food labels  The Nutrition Facts label lists the amount of sodium in one serving of the food. If you eat more than one serving, you must multiply the listed amount of sodium by the number of servings.  Choose foods with less than 140 mg of sodium per serving.  Avoid foods with 300 mg of sodium or more per serving. Shopping  Look for lower-sodium products, often labeled as "low-sodium" or "no salt added."  Always check the sodium content even if foods are labeled as "unsalted" or "no salt added".  Buy fresh foods. ? Avoid canned foods and premade or frozen meals. ? Avoid canned, cured, or processed meats  Buy breads that have less than 80 mg of sodium per slice. Cooking  Eat more home-cooked food and less restaurant, buffet, and fast food.  Avoid adding salt when cooking. Use salt-free seasonings or herbs instead of table salt or sea salt. Check with your health care provider or pharmacist before using salt substitutes.  Cook with plant-based oils, such as canola, sunflower, or olive oil. Meal planning  When eating at a restaurant, ask that your food be prepared with less salt or no salt, if possible.  Avoid foods that contain MSG (monosodium glutamate). MSG is sometimes added to [place] food, bouillon, and some canned foods. What foods  are recommended? The items listed may not be a complete list. Talk with your dietitian about what dietary choices are best for you. Grains Low-sodium cereals, including oats, puffed wheat and rice, and shredded wheat. Low-sodium crackers. Unsalted rice. Unsalted pasta. Low-sodium bread. Whole-grain breads and whole-grain pasta. Vegetables Fresh or frozen vegetables. "No salt added" canned vegetables. "No salt added" tomato sauce and paste. Low-sodium or reduced-sodium tomato and vegetable juice. Fruits Fresh, frozen, or canned fruit. Fruit juice. Meats and other protein foods Fresh or frozen (no salt added) meat, poultry, seafood, and fish. Low-sodium canned tuna and salmon. Unsalted nuts. Dried peas, beans, and lentils without added salt. Unsalted canned beans. Eggs. Unsalted nut butters. Dairy Milk. Soy milk. Cheese that is naturally low in sodium, such as ricotta cheese, fresh mozzarella, or Swiss cheese Low-sodium or reduced-sodium cheese. Cream cheese. Yogurt. Fats and oils Unsalted butter. Unsalted margarine with no trans fat. Vegetable oils such as canola or olive oils. Seasonings and other foods Fresh and dried herbs and spices. Salt-free seasonings. Low-sodium mustard and ketchup. Sodium-free salad dressing. Sodium-free light mayonnaise. Fresh or refrigerated horseradish. Lemon juice. Vinegar. Homemade, reduced-sodium, or low-sodium soups. Unsalted popcorn and pretzels. Low-salt or salt-free chips. What foods are not recommended? The items listed may not be a complete list. Talk with your dietitian about what dietary choices are best for you. Grains Instant hot cereals. Bread stuffing, pancake, and biscuit mixes. Croutons. Seasoned rice or pasta mixes. Noodle soup cups. Boxed or frozen macaroni and cheese. Regular salted crackers. Self-rising flour. Vegetables Sauerkraut, pickled vegetables, and relishes. Olives. [place] fries. Onion rings. Regular canned vegetables (not low-sodium or  reduced-sodium). Regular canned tomato sauce and paste (not low-sodium or reduced-sodium). Regular tomato and vegetable juice (not low-sodium or reduced-sodium). Frozen vegetables in sauces. Meats and other protein foods Meat or fish that is salted, canned, smoked, spiced, or pickled.  Bacon, ham, sausage, hotdogs, corned beef, chipped beef, packaged lunch meats, salt pork, jerky, pickled herring, anchovies, regular canned tuna, sardines, salted nuts. Dairy Processed cheese and cheese spreads. Cheese curds. Blue cheese. Feta cheese. String cheese. Regular cottage cheese. Buttermilk. Canned milk. Fats and oils Salted butter. Regular margarine. Ghee. Bacon fat. Seasonings and other foods Onion salt, garlic salt, seasoned salt, table salt, and sea salt. Canned and packaged gravies. Worcestershire sauce. Tartar sauce. Barbecue sauce. Teriyaki sauce. Soy sauce, including reduced-sodium. Steak sauce. Fish sauce. Oyster sauce. Cocktail sauce. Horseradish that you find on the shelf. Regular ketchup and mustard. Meat flavorings and tenderizers. Bouillon cubes. Hot sauce and Tabasco sauce. Premade or packaged marinades. Premade or packaged taco seasonings. Relishes. Regular salad dressings. Salsa. Potato and tortilla chips. Corn chips and puffs. Salted popcorn and pretzels. Canned or dried soups. Pizza. Frozen entrees and pot pies. Summary  Eating less sodium can help lower your blood pressure, reduce swelling, and protect your heart, liver, and kidneys.  Most people on this plan should limit their sodium intake to 1,500-2,000 mg (milligrams) of sodium each day.  Canned, boxed, and frozen foods are high in sodium. Restaurant foods, fast foods, and pizza are also very high in sodium. You also get sodium by adding salt to food.  Try to cook at home, eat more fresh fruits and vegetables, and eat less fast food, canned, processed, or prepared foods. This information is not intended to replace advice given to you  by your health care provider. Make sure you discuss any questions you have with your health care provider. Document Released: 10/19/2001 Document Revised: 04/22/2016 Document Reviewed: 04/22/2016 Elsevier Interactive Patient Education  2017 [place], Ermalinda Barrios, [place]  12/09/2016 1:23 PM    [facility] [place], [place], [place]  [address] Phone: [phone]; Fax: [phone]

## 2016-12-09 NOTE — Patient Outreach (Signed)
[facility]) Care Management  12/09/2016  [name] 1945/06/30 [id number]   RN outreached to pt today for ongoing transition of care. Pt currently in route to a provider's visit and unable to talk but verified he has been weighing daily. RN offered to call back at a later time or date as pt receptive to a call back of inquire tomorrow. Will rescheduled for another follow up call at that time.  [name], RN Care Management Coordinator [place] Office [phone]

## 2016-12-10 ENCOUNTER — Other Ambulatory Visit: Payer: Self-pay | Admitting: *Deleted

## 2016-12-10 ENCOUNTER — Telehealth: Payer: Self-pay | Admitting: Nurse Practitioner

## 2016-12-10 DIAGNOSIS — R7881 Bacteremia: Secondary | ICD-10-CM | POA: Diagnosis not present

## 2016-12-10 DIAGNOSIS — M1990 Unspecified osteoarthritis, unspecified site: Secondary | ICD-10-CM | POA: Diagnosis not present

## 2016-12-10 DIAGNOSIS — Z792 Long term (current) use of antibiotics: Secondary | ICD-10-CM | POA: Diagnosis not present

## 2016-12-10 DIAGNOSIS — D630 Anemia in neoplastic disease: Secondary | ICD-10-CM | POA: Diagnosis not present

## 2016-12-10 DIAGNOSIS — Z87891 Personal history of nicotine dependence: Secondary | ICD-10-CM | POA: Diagnosis not present

## 2016-12-10 DIAGNOSIS — I5032 Chronic diastolic (congestive) heart failure: Secondary | ICD-10-CM | POA: Diagnosis not present

## 2016-12-10 DIAGNOSIS — I272 Pulmonary hypertension, unspecified: Secondary | ICD-10-CM | POA: Diagnosis not present

## 2016-12-10 DIAGNOSIS — G8918 Other acute postprocedural pain: Secondary | ICD-10-CM

## 2016-12-10 DIAGNOSIS — R0789 Other chest pain: Principal | ICD-10-CM

## 2016-12-10 DIAGNOSIS — Z9981 Dependence on supplemental oxygen: Secondary | ICD-10-CM | POA: Diagnosis not present

## 2016-12-10 DIAGNOSIS — I480 Paroxysmal atrial fibrillation: Secondary | ICD-10-CM | POA: Diagnosis not present

## 2016-12-10 DIAGNOSIS — C61 Malignant neoplasm of prostate: Secondary | ICD-10-CM | POA: Diagnosis not present

## 2016-12-10 DIAGNOSIS — I110 Hypertensive heart disease with heart failure: Secondary | ICD-10-CM | POA: Diagnosis not present

## 2016-12-10 DIAGNOSIS — J449 Chronic obstructive pulmonary disease, unspecified: Secondary | ICD-10-CM | POA: Diagnosis not present

## 2016-12-10 DIAGNOSIS — G8928 Other chronic postprocedural pain: Secondary | ICD-10-CM

## 2016-12-10 DIAGNOSIS — J90 Pleural effusion, not elsewhere classified: Secondary | ICD-10-CM

## 2016-12-10 DIAGNOSIS — B953 Streptococcus pneumoniae as the cause of diseases classified elsewhere: Secondary | ICD-10-CM | POA: Diagnosis not present

## 2016-12-10 MED ORDER — TRAMADOL HCL 50 MG PO TABS: 50.0000 mg | ORAL_TABLET | Freq: Every day | ORAL | 0 refills | Status: DC

## 2016-12-10 MED ORDER — GABAPENTIN 300 MG PO CAPS: 300.0000 mg | ORAL_CAPSULE | Freq: Every day | ORAL | 1 refills | Status: DC

## 2016-12-10 NOTE — Telephone Encounter (Signed)
I spoke with the pts wife. I explained to her what Baldo Ash said. She said that they have tried this before. The pt is upset because he is in a lot of pain and Tylenol does not help his pain. She would like a call back at your convience.  (She is wanting to talk to [place])

## 2016-12-10 NOTE — Telephone Encounter (Signed)
Pt wife called asking if Baldo Ash will refill her husbands traMADol (ULTRAM) 50 MG tablet  His surgeon stated that it needed to be refilled by his PCP

## 2016-12-10 NOTE — Patient Outreach (Signed)
[facility]) Care Management  12/10/2016  [name] 1945-09-19 [id number]  RN spoke with and reintroduced [facility] services once again with the ongoing transition of care. Pt unaware of his weights and prefers RN to speak with his wife another day this week (Thursday).  RN will reschedule for ongoing transition of care and telephone assessment.   [name], RN Care Management Coordinator [place] Office [phone]

## 2016-12-10 NOTE — Telephone Encounter (Signed)
Spoke with spouse, pt would like to try gabapentin and referral to pain clinic as well. Please advise.    [facility] on [address] is the pharmacy.

## 2016-12-10 NOTE — Telephone Encounter (Incomplete)
I am sorry I will not will prescribing additional tramadol. He has appt with Dr. Lawson Fiscal tomorrow. He can discuss continuation of tramadol use.  Typically 23monthpostop, it is recommended to wean off opiods due to risk of dependence.

## 2016-12-10 NOTE — Telephone Encounter (Signed)
I am sorry I will not be prescribing additional tramadol. I will be happy to discuss other ways to manage his pain without opiod use during an office visit. If he only wants tramadol, he has appt with Dr. Darcey Nora tomorrow at 10:30am. Please discuss need for prolonged tramadol use.

## 2016-12-10 NOTE — Telephone Encounter (Signed)
Check [place] registry last filled 11/07/2016 @ [facility] pls advise...[name]

## 2016-12-11 ENCOUNTER — Other Ambulatory Visit: Payer: Self-pay | Admitting: *Deleted

## 2016-12-11 ENCOUNTER — Ambulatory Visit
Admission: RE | Admit: 2016-12-11 | Discharge: 2016-12-11 | Disposition: A | Discharge status: Home / Self Care | Payer: Medicare Other | Source: Ambulatory Visit | Attending: Cardiothoracic Surgery | Admitting: Cardiothoracic Surgery

## 2016-12-11 ENCOUNTER — Encounter: Payer: Self-pay | Admitting: Cardiothoracic Surgery

## 2016-12-11 ENCOUNTER — Ambulatory Visit (INDEPENDENT_AMBULATORY_CARE_PROVIDER_SITE_OTHER): Payer: Medicare Other | Admitting: Cardiothoracic Surgery

## 2016-12-11 VITALS — BP 91/55 | HR 75 | Resp 16 | Ht 71.0 in | Wt 155.8 lb

## 2016-12-11 DIAGNOSIS — Z09 Encounter for follow-up examination after completed treatment for conditions other than malignant neoplasm: Secondary | ICD-10-CM

## 2016-12-11 DIAGNOSIS — J90 Pleural effusion, not elsewhere classified: Secondary | ICD-10-CM

## 2016-12-11 DIAGNOSIS — J869 Pyothorax without fistula: Secondary | ICD-10-CM

## 2016-12-11 DIAGNOSIS — R7881 Bacteremia: Secondary | ICD-10-CM | POA: Diagnosis not present

## 2016-12-11 DIAGNOSIS — B953 Streptococcus pneumoniae as the cause of diseases classified elsewhere: Secondary | ICD-10-CM

## 2016-12-11 NOTE — Progress Notes (Signed)
PCP is Nche, Charlene Brooke, NP Referring Provider is Modena Jansky, MD  Chief Complaint  Patient presents with  . Pleural Effusion    f/u from hospital with CXR, right pleural effusion     HPI: The patient returns for follow-up. The patient is status post right VATS and drainage of empyema several months ago. He has COPD. he was recently hospitalized for bacteremia-Streptococcus. He presents with a chest x-ray for evaluation of a small right pleural effusion. When he was hospitalized in attempt at thoracentesis returned only 20 cc. The patient now has been at home for 2 weeks. He was placed on home oxygen at time of discharge. He is finish his antibiotics.  Today's chest x-ray shows significant improvement in the right pleural effusion. It is minimal. It does not require drainage.  The patient on exam demonstrates classic COPD with evidence of cor pulmonale by recent echo showing RV dysfunction and moderate TR. The patient has issues with atrial fibrillation, poor nutrition, lack of appetite and ankle edema.  Past Medical History:  Diagnosis Date  . Anxiety   . Arthritis    "neck" (08/15/2015)  . Chronic bronchitis ([place])   . Chronic diastolic CHF (congestive heart failure) ([place])   . Constipation   . COPD (chronic obstructive pulmonary disease) ([place])   . DDD (degenerative disc disease), cervical   . Depression   . Dilated aortic root ([place])    27m on echo 05/2016  . Dysrhythmia    A-Fib  cardioverted on 10-11-2016  . Edema of left lower extremity   . Facial basal cell cancer   . H/O acne vulgaris 1960s   [facility]  . Headache    history of - left temporal- years ago- not current (08/15/2015)  . Persistent atrial fibrillation (HCC)    on coumadin with CHADS2VASC score of 2  . Pneumonia 1960s; 2015 X 2  . Prostate cancer ([place] dx'd early 2000s   "low spreading; non aggressive type" (08/15/2015)  . Pulmonary HTN ([place]    PASP 46mg by  echo 05/2016  . Skin cancer    "back"    Past Surgical History:  Procedure Laterality Date  . CARDIOVERSION N/A 10/11/2016   Procedure: CARDIOVERSION;  Surgeon: CrSanda KleinMD;  [facility];  Service: Cardiovascular;  Laterality: N/A;  . COLONOSCOPY    . EXCISIONAL HEMORRHOIDECTOMY  1960s  . INGUINAL HERNIA REPAIR Right 2002  . INGUINAL HERNIA REPAIR Bilateral 08/15/2015  . INGUINAL HERNIA REPAIR Bilateral 08/15/2015   Procedure: OPEN REPAIR RECURRENT RIGHT INGUINAL HERNIA WITH MESH AND REPAIR LEFT INGUINAL HERNIA WITH MESH;  Surgeon: HaFanny SkatesMD;  [place] [facility] [place] [facility]: General;  Laterality: Bilateral;  . INSERTION OF MESH Bilateral 08/15/2015   Procedure: INSERTION OF MESH;  Surgeon: HaFanny SkatesMD;  [facility] [place] Service: General;  Laterality: Bilateral;  . IR THORACENTESIS ASP PLEURAL SPACE W/IMG GUIDE  11/18/2016  . LYMPH NODE BIOPSY Bilateral 10/22/2016   Procedure: LEFT NECK LYMPH NODE BIOPSY;  Surgeon: VaIvin PootMD;  [facility] [facility]: Thoracic;  Laterality: Bilateral;  . PROSTATE BIOPSY    . TONSILLECTOMY  1950s  . VIDEO ASSISTED THORACOSCOPY (VATS)/EMPYEMA Right 05/20/2016   Procedure: VIDEO ASSISTED THORACOSCOPY (VATS) with drainage of pleural effusion;  Surgeon: PeIvin PootMD;  [facility] Service: Thoracic;  Laterality: Right;  . Marland KitchenIDEO BRONCHOSCOPY WITH ENDOBRONCHIAL ULTRASOUND Right 05/20/2016   Procedure: VIDEO  BRONCHOSCOPY WITH ENDOBRONCHIAL ULTRASOUND;  Surgeon: [name], MD;  Location: [facility] OR;  Service: Thoracic;  Laterality: Right;    Family History  Problem Relation Age of Onset  . Cancer Mother        colon cancer  . Alcoholism Father   . CAD Father   . Alcohol abuse Father   . Ovarian cancer Sister   . Diabetes Neg Hx     Social History Social History  Substance Use Topics  . Smoking status: Former Smoker    Packs/day: 1.00    Years: 62.00    Types: Cigarettes    Quit date: 05/14/2016  . Smokeless  tobacco: Never Used  . Alcohol use No    Current Outpatient Prescriptions  Medication Sig Dispense Refill  . acetaminophen (TYLENOL) 325 MG tablet Take 2 tablets (650 mg total) by mouth every 6 (six) hours as needed for mild pain or fever.    . Ascorbic Acid (VITAMIN C) 1000 MG tablet Take 1,000 mg by mouth every other day.     . DULoxetine (CYMBALTA) 30 MG capsule Take 1 capsule (30 mg total) by mouth daily. 30 capsule 0  . furosemide (LASIX) 40 MG tablet Take 40 mg by mouth daily.     [name] gabapentin (NEURONTIN) 300 MG capsule Take 1 capsule (300 mg total) by mouth at bedtime. 30 capsule 1  . rivaroxaban (XARELTO) 20 MG TABS tablet Take 1 tablet (20 mg total) by mouth daily with supper. 30 tablet 5  . spironolactone (ALDACTONE) 25 MG tablet Take 0.5 tablets (12.5 mg total) by mouth daily. 45 tablet 3  . traMADol (ULTRAM) 50 MG tablet Take 1 tablet (50 mg total) by mouth at bedtime. 7 tablet 0   No current facility-administered medications for this visit.     Allergies  Allergen Reactions  . Demerol [Meperidine] Other (See Comments)    UNSPECIFIED REACTION  Causes system to shutdown. ?   . Oxycodone Hcl Other (See Comments)    Pt states this medication 'wires him up' and makes pt hyper; pt does not want to take this ever again    Review of Systems   Depressed about poor health in general mainly due to his COPD Has appointment to see Dr. Curt Jews later this fall Using home oxygen 2 L nasal cannula  BP (!) 91/55 (BP Location: Right Arm, Patient Position: Sitting, Cuff Size: Normal)   Pulse 75   Resp 16   Ht 5' 11" (1.803 m)   Wt 155 lb 12.8 oz (70.7 kg)   SpO2 95% Comment: ON 2L O2  BMI 21.73 kg/m  Physical Exam Appears chronically ill Diminished breath sounds bilaterally consistent with emphysema 2+ ankle edema Irregular heart rhythm without murmur  Diagnostic Tests: Chest x-ray shows improvement in right pleural effusion with COPD-emphysema  Impression: No thoracic  surgical issues at this time. Patient needs general medical care of his chronic lung disease, malnutrition, right heart failure, and depression. Plan: Return with chest x-ray in 6 months.   Len Childs, MD [facility] [phone]

## 2016-12-12 ENCOUNTER — Encounter: Payer: Self-pay | Admitting: *Deleted

## 2016-12-12 ENCOUNTER — Encounter (HOSPITAL_COMMUNITY)
Admission: RE | Admit: 2016-12-12 | Discharge: 2016-12-12 | Disposition: A | Discharge status: Home / Self Care | Payer: Medicare Other | Source: Ambulatory Visit | Attending: Urology | Admitting: Urology

## 2016-12-12 ENCOUNTER — Other Ambulatory Visit: Payer: Self-pay | Admitting: *Deleted

## 2016-12-12 DIAGNOSIS — C61 Malignant neoplasm of prostate: Secondary | ICD-10-CM | POA: Diagnosis not present

## 2016-12-12 DIAGNOSIS — R918 Other nonspecific abnormal finding of lung field: Secondary | ICD-10-CM | POA: Diagnosis not present

## 2016-12-12 MED ORDER — AXUMIN (FLUCICLOVINE F 18) INJECTION: 7.5000 | Freq: Once | INTRAVENOUS | Status: DC

## 2016-12-12 MED ORDER — AXUMIN (FLUCICLOVINE F 18) INJECTION
8.9000 | Freq: Once | INTRAVENOUS | Status: AC
  Administered 2016-12-12: 8.9 via INTRAVENOUS

## 2016-12-12 NOTE — Patient Outreach (Signed)
[name]) Care Management  12/12/2016  [name] 07/28/1945 [id number]   RN spoke with pt and his primary caregiver spouse [name]). Reports pt is doing well but continues to have "alittle swelling to his lower leg" (provider is aware). Pt continues to weight daily with reported weights yesterday 154.2 lbs, today 152.6 lbs and last week at 157 lbs. Pt pending a nutritionist consult for improving his dietary habits. Also reports pt is being weaned off of his Tramadol and placed on gabapentin for his ongoing overall discomfort. RN able to completed the initial assessment and reviewed the HF zones and verified pt remains in the GREEN zone with no reported acute symptoms. All medications updated and review with pt today with a clear understanding of the new medications and purpose for weaning the Tramadol. RN will continue to review the plan of care/goals and encourage daily weights and documentation for his providers to view. Pt continue to report PT continues to work with pt weekly as he continues to improve with his mobility. No acute problems reported today as pt receptive to ongoing transition of care at this time only and continue to decline home visit for a more one-on-one educational measure concerning his medical issues. No other inquires or request at this time as spouse very grateful for the call today. Will scheduled the next telephone call for ongoing transition of care next week.   Christopher Mina, RN Care Management Coordinator [place] Office [phone]

## 2016-12-13 DIAGNOSIS — R7881 Bacteremia: Secondary | ICD-10-CM | POA: Diagnosis not present

## 2016-12-13 DIAGNOSIS — I272 Pulmonary hypertension, unspecified: Secondary | ICD-10-CM | POA: Diagnosis not present

## 2016-12-13 DIAGNOSIS — B953 Streptococcus pneumoniae as the cause of diseases classified elsewhere: Secondary | ICD-10-CM | POA: Diagnosis not present

## 2016-12-13 DIAGNOSIS — I110 Hypertensive heart disease with heart failure: Secondary | ICD-10-CM | POA: Diagnosis not present

## 2016-12-13 DIAGNOSIS — Z9981 Dependence on supplemental oxygen: Secondary | ICD-10-CM | POA: Diagnosis not present

## 2016-12-13 DIAGNOSIS — Z792 Long term (current) use of antibiotics: Secondary | ICD-10-CM | POA: Diagnosis not present

## 2016-12-13 DIAGNOSIS — I480 Paroxysmal atrial fibrillation: Secondary | ICD-10-CM | POA: Diagnosis not present

## 2016-12-13 DIAGNOSIS — Z87891 Personal history of nicotine dependence: Secondary | ICD-10-CM | POA: Diagnosis not present

## 2016-12-13 DIAGNOSIS — J449 Chronic obstructive pulmonary disease, unspecified: Secondary | ICD-10-CM | POA: Diagnosis not present

## 2016-12-13 DIAGNOSIS — M1990 Unspecified osteoarthritis, unspecified site: Secondary | ICD-10-CM | POA: Diagnosis not present

## 2016-12-13 DIAGNOSIS — C61 Malignant neoplasm of prostate: Secondary | ICD-10-CM | POA: Diagnosis not present

## 2016-12-13 DIAGNOSIS — I5032 Chronic diastolic (congestive) heart failure: Secondary | ICD-10-CM | POA: Diagnosis not present

## 2016-12-13 DIAGNOSIS — D630 Anemia in neoplastic disease: Secondary | ICD-10-CM | POA: Diagnosis not present

## 2016-12-16 ENCOUNTER — Ambulatory Visit: Payer: Medicare Other | Admitting: *Deleted

## 2016-12-17 ENCOUNTER — Telehealth: Payer: Self-pay | Admitting: Nurse Practitioner

## 2016-12-17 DIAGNOSIS — J449 Chronic obstructive pulmonary disease, unspecified: Secondary | ICD-10-CM | POA: Diagnosis not present

## 2016-12-17 DIAGNOSIS — B953 Streptococcus pneumoniae as the cause of diseases classified elsewhere: Secondary | ICD-10-CM | POA: Diagnosis not present

## 2016-12-17 DIAGNOSIS — I272 Pulmonary hypertension, unspecified: Secondary | ICD-10-CM | POA: Diagnosis not present

## 2016-12-17 DIAGNOSIS — I480 Paroxysmal atrial fibrillation: Secondary | ICD-10-CM | POA: Diagnosis not present

## 2016-12-17 DIAGNOSIS — Z9981 Dependence on supplemental oxygen: Secondary | ICD-10-CM | POA: Diagnosis not present

## 2016-12-17 DIAGNOSIS — I5032 Chronic diastolic (congestive) heart failure: Secondary | ICD-10-CM | POA: Diagnosis not present

## 2016-12-17 DIAGNOSIS — Z792 Long term (current) use of antibiotics: Secondary | ICD-10-CM | POA: Diagnosis not present

## 2016-12-17 DIAGNOSIS — M1990 Unspecified osteoarthritis, unspecified site: Secondary | ICD-10-CM | POA: Diagnosis not present

## 2016-12-17 DIAGNOSIS — Z87891 Personal history of nicotine dependence: Secondary | ICD-10-CM | POA: Diagnosis not present

## 2016-12-17 DIAGNOSIS — I110 Hypertensive heart disease with heart failure: Secondary | ICD-10-CM | POA: Diagnosis not present

## 2016-12-17 DIAGNOSIS — R7881 Bacteremia: Secondary | ICD-10-CM | POA: Diagnosis not present

## 2016-12-17 DIAGNOSIS — D630 Anemia in neoplastic disease: Secondary | ICD-10-CM | POA: Diagnosis not present

## 2016-12-17 DIAGNOSIS — C61 Malignant neoplasm of prostate: Secondary | ICD-10-CM | POA: Diagnosis not present

## 2016-12-17 LAB — CULTURE, BLOOD (SINGLE)
Organism ID, Bacteria: NO GROWTH
Organism ID, Bacteria: NO GROWTH

## 2016-12-17 NOTE — Telephone Encounter (Signed)
Verbal Orders:  Patient was placed on 02 in the hospital. They would like to work on him coming off of the supplement 02. Can not see pulmonary until Oct.  [phone] Elmyra Ricks, PT

## 2016-12-17 NOTE — Telephone Encounter (Signed)
Left vm for Elmyra Ricks to call back. Need to inform her that [facility] Ash want Mr. Zaring to remain on O2 until he sees pulmonology.

## 2016-12-17 NOTE — Telephone Encounter (Signed)
Maintain O2 till evaluated by pulmonology

## 2016-12-17 NOTE — Telephone Encounter (Signed)
Spoke with Elmyra Ricks, she is aware.

## 2016-12-18 ENCOUNTER — Telehealth: Payer: Self-pay | Admitting: Pulmonary Disease

## 2016-12-18 DIAGNOSIS — R7881 Bacteremia: Secondary | ICD-10-CM | POA: Diagnosis not present

## 2016-12-18 DIAGNOSIS — I480 Paroxysmal atrial fibrillation: Secondary | ICD-10-CM | POA: Diagnosis not present

## 2016-12-18 DIAGNOSIS — I272 Pulmonary hypertension, unspecified: Secondary | ICD-10-CM | POA: Diagnosis not present

## 2016-12-18 DIAGNOSIS — I5032 Chronic diastolic (congestive) heart failure: Secondary | ICD-10-CM | POA: Diagnosis not present

## 2016-12-18 DIAGNOSIS — Z87891 Personal history of nicotine dependence: Secondary | ICD-10-CM | POA: Diagnosis not present

## 2016-12-18 DIAGNOSIS — I110 Hypertensive heart disease with heart failure: Secondary | ICD-10-CM | POA: Diagnosis not present

## 2016-12-18 DIAGNOSIS — Z792 Long term (current) use of antibiotics: Secondary | ICD-10-CM | POA: Diagnosis not present

## 2016-12-18 DIAGNOSIS — B953 Streptococcus pneumoniae as the cause of diseases classified elsewhere: Secondary | ICD-10-CM | POA: Diagnosis not present

## 2016-12-18 DIAGNOSIS — D630 Anemia in neoplastic disease: Secondary | ICD-10-CM | POA: Diagnosis not present

## 2016-12-18 DIAGNOSIS — J449 Chronic obstructive pulmonary disease, unspecified: Secondary | ICD-10-CM | POA: Diagnosis not present

## 2016-12-18 DIAGNOSIS — Z9981 Dependence on supplemental oxygen: Secondary | ICD-10-CM | POA: Diagnosis not present

## 2016-12-18 DIAGNOSIS — C61 Malignant neoplasm of prostate: Secondary | ICD-10-CM | POA: Diagnosis not present

## 2016-12-18 DIAGNOSIS — M1990 Unspecified osteoarthritis, unspecified site: Secondary | ICD-10-CM | POA: Diagnosis not present

## 2016-12-18 NOTE — Telephone Encounter (Signed)
Spoke with patient's wife-they need to have pulmonary Consult for O2 needs and was set up while in hospital but never seen this time by a Pulmonary MD. Will ask CY in AM if okay to add patient on Monday 12/23/16.

## 2016-12-19 ENCOUNTER — Other Ambulatory Visit: Payer: Self-pay | Admitting: *Deleted

## 2016-12-19 DIAGNOSIS — I480 Paroxysmal atrial fibrillation: Secondary | ICD-10-CM | POA: Diagnosis not present

## 2016-12-19 DIAGNOSIS — Z87891 Personal history of nicotine dependence: Secondary | ICD-10-CM | POA: Diagnosis not present

## 2016-12-19 DIAGNOSIS — C61 Malignant neoplasm of prostate: Secondary | ICD-10-CM | POA: Diagnosis not present

## 2016-12-19 DIAGNOSIS — Z9981 Dependence on supplemental oxygen: Secondary | ICD-10-CM | POA: Diagnosis not present

## 2016-12-19 DIAGNOSIS — I5032 Chronic diastolic (congestive) heart failure: Secondary | ICD-10-CM | POA: Diagnosis not present

## 2016-12-19 DIAGNOSIS — I272 Pulmonary hypertension, unspecified: Secondary | ICD-10-CM | POA: Diagnosis not present

## 2016-12-19 DIAGNOSIS — R7881 Bacteremia: Secondary | ICD-10-CM | POA: Diagnosis not present

## 2016-12-19 DIAGNOSIS — M1990 Unspecified osteoarthritis, unspecified site: Secondary | ICD-10-CM | POA: Diagnosis not present

## 2016-12-19 DIAGNOSIS — J449 Chronic obstructive pulmonary disease, unspecified: Secondary | ICD-10-CM | POA: Diagnosis not present

## 2016-12-19 DIAGNOSIS — Z792 Long term (current) use of antibiotics: Secondary | ICD-10-CM | POA: Diagnosis not present

## 2016-12-19 DIAGNOSIS — I110 Hypertensive heart disease with heart failure: Secondary | ICD-10-CM | POA: Diagnosis not present

## 2016-12-19 DIAGNOSIS — B953 Streptococcus pneumoniae as the cause of diseases classified elsewhere: Secondary | ICD-10-CM | POA: Diagnosis not present

## 2016-12-19 DIAGNOSIS — D630 Anemia in neoplastic disease: Secondary | ICD-10-CM | POA: Diagnosis not present

## 2016-12-19 NOTE — Telephone Encounter (Signed)
Ok to have patient booked on Monday 12-23-16 at 11:30 am slot for pulmonary consult. Schedulers can make this appt when patient or wife calls back and close message.

## 2016-12-19 NOTE — Telephone Encounter (Signed)
Per message, Patient is scheduled with CY on 12/23/2016 at 11:30.

## 2016-12-19 NOTE — Patient Outreach (Signed)
[facility]) Care Management  12/19/2016  VIHAAN GLOSS 1945/08/18 [id number]   RN spoke with consent wife [name]) and primary caregiver who reports pt is doing well with no encountered issues. States pt is weighing daily with a weight of today at 146 lbs, yesterday 148 lbs and last week 149 lbs as he continues to loss some weight with no signs or symptoms of HF. RN will verified pt remains in the GREEN zone with no reported issues. RN verified plan of care along with the goals that are in place (remains receptive). RN continued to review the HF zones related to what to do if there is a weight gain of 3 lbs overnight or 5 lbs within one week (vebralized an understanding). Continue to offer home visits however remains receptive to telephone transition of care contacts. Requested to reschedule next week transition of care (agreed).   [name], RN Care Management Coordinator [place] Office [phone]

## 2016-12-19 NOTE — Telephone Encounter (Signed)
Ok to work out something

## 2016-12-19 NOTE — Telephone Encounter (Signed)
CY Please advise if ok to add patient.  Thanks!

## 2016-12-23 ENCOUNTER — Encounter: Payer: Self-pay | Admitting: Internal Medicine

## 2016-12-23 ENCOUNTER — Telehealth: Payer: Self-pay | Admitting: Internal Medicine

## 2016-12-23 ENCOUNTER — Other Ambulatory Visit (INDEPENDENT_AMBULATORY_CARE_PROVIDER_SITE_OTHER): Payer: Medicare Other

## 2016-12-23 ENCOUNTER — Ambulatory Visit (INDEPENDENT_AMBULATORY_CARE_PROVIDER_SITE_OTHER): Payer: Medicare Other | Admitting: Internal Medicine

## 2016-12-23 VITALS — BP 110/60 | HR 72 | Ht 71.0 in | Wt 142.4 lb

## 2016-12-23 DIAGNOSIS — J90 Pleural effusion, not elsewhere classified: Secondary | ICD-10-CM

## 2016-12-23 DIAGNOSIS — J9611 Chronic respiratory failure with hypoxia: Secondary | ICD-10-CM | POA: Diagnosis not present

## 2016-12-23 DIAGNOSIS — J9601 Acute respiratory failure with hypoxia: Secondary | ICD-10-CM

## 2016-12-23 DIAGNOSIS — J449 Chronic obstructive pulmonary disease, unspecified: Secondary | ICD-10-CM | POA: Diagnosis not present

## 2016-12-23 DIAGNOSIS — Z23 Encounter for immunization: Secondary | ICD-10-CM

## 2016-12-23 LAB — HEPATIC FUNCTION PANEL
ALK PHOS: 194 U/L — AB (ref 39–117)
ALT: 14 U/L (ref 0–53)
AST: 17 U/L (ref 0–37)
Albumin: 2.4 g/dL — ABNORMAL LOW (ref 3.5–5.2)
BILIRUBIN TOTAL: 0.4 mg/dL (ref 0.2–1.2)
Bilirubin, Direct: 0.4 mg/dL — ABNORMAL HIGH (ref 0.0–0.3)
Total Protein: 5.9 g/dL — ABNORMAL LOW (ref 6.0–8.3)

## 2016-12-23 LAB — BASIC METABOLIC PANEL
BUN: 27 mg/dL — ABNORMAL HIGH (ref 6–23)
CALCIUM: 8.4 mg/dL (ref 8.4–10.5)
CO2: 31 mEq/L (ref 19–32)
Chloride: 100 mEq/L (ref 96–112)
Creatinine, Ser: 0.65 mg/dL (ref 0.40–1.50)
GFR: 127.79 mL/min (ref >60.00)
Glucose, Bld: 88 mg/dL (ref 70–99)
POTASSIUM: 4 meq/L (ref 3.5–5.1)
SODIUM: 135 meq/L (ref 135–145)

## 2016-12-23 NOTE — Telephone Encounter (Signed)
Spoke with Corene Cornea at [facility], needs ONO order placed again with specification of "room air" on order.  New order has been placed.  Nothing further needed.

## 2016-12-23 NOTE — Assessment & Plan Note (Signed)
I don't hear wheezing and he doesn't describe it. We will be able to get spirometry at next visit and make decisions about use of bronchodilators

## 2016-12-23 NOTE — Assessment & Plan Note (Signed)
Discharged on oxygen from hospitalization 11/24/16. His understanding was that they thought he probably didn't need oxygen anymore but didn't have time to discontinue it. He does not desaturate on room air at rest or with slow walk one lap at this visit. Plan-overnight oximetry on room air. He isn't strong enough to carry portable oxygen and doesn't seem to need it. He would except nighttime oxygen use as a compromise if he can get rid of daytime oxygen. We will make final deciion fter his overnight oximetry.

## 2016-12-23 NOTE — Progress Notes (Signed)
12/23/16-71 year old male former smoker. Pt was in hospital and placed on O2 24/7. Needs MD to follow O2 needs now. DME [facility] follow up-7/8-7/15/2018-COPD with HCAP, hypoxic respiratory failure, empyema, strep pneumoniae bacteremia, protein calorie malnutrition, diastolic CHF, persistent atrial flutter pulmonary HTN, prostate cancer with possible metastases to lymph nodes. Right VATS in January. Peripheral edema.   Thoracentesis by IR on 11/18/16. [facility] follow-up with urology, thoracic surgery, cardiology. Arrival Sat 99% on 2L/ Advanced. The patient and his wife want to see if he can now stop his home oxygen. They thought it was going to be stopped at the time of discharge, but the nurse didn't have enough time so she doesn't put the order through to continue it until he could be reassessed. He forgot and set watching TV for 2 hours on room air and didn't miss his oxygen but otherwise he has been compliant. Just quit smoking in January 2018. Now says his breathing is "pretty good" except in hot and humid weather. Little cough or wheeze. Has lost weight but it's not clear how much of this was due to diuresis. He does not feel he is gaining more fluid now Walk Test on room air 12/23/16-HR 78/98%, HR 94/99%. Patient walked with slow pace only completing one lap. Had been off oxygen for about 10 minutes before walk and did not desaturate during walk. Distance Limited by weakness. Office Spirometry machine was not operable at this visit. Spirometry will be done on return. PET scan 12/12/16 IMPRESSION: 1. No clear evidence of prostate cancer nodal metastasis by Fluciclovine imaging. No significant radiotracer accumulation within the enlarged subcarinal lymph node, periaortic lymph nodes or deep RIGHT pelvis lymph nodes. 2. Diffuse uptake within the prostate gland greater on the RIGHT. Differential includes prostate hypertrophy versus residual carcinoma. 3. No evidence skeletal  metastasis 4. Interval increase in bilateral pleural effusions. CXR 12/11/16 IMPRESSION: 1. Partially loculated right pleural effusion is diminished from 11/23/2016. 2. Small dependent left pleural effusion that is similar to prior CT chest 11/18/16 IMPRESSION: 1. Increase in the size of the pleural effusion compared to the prior study with associated partial compressive atelectasis of the lower lobes. Pneumonia is not excluded. Clinical correlation is recommended. 2. Moderate emphysema. 3. Bulky mediastinal and mild bilateral axillary adenopathy. Evaluation of the lymph nodes is somewhat limited on this noncontrast study. 4. Diffuse soft tissue edema and anasarca. 5. Aortic Atherosclerosis (ICD10-I70.0) and Emphysema (ICD10-J43.9).  Prior to Admission medications   Medication Sig Start Date End Date Taking? Authorizing Provider  acetaminophen (TYLENOL) 325 MG tablet Take 2 tablets (650 mg total) by mouth every 6 (six) hours as needed for mild pain or fever. 11/24/16  Yes Hongalgi, Lenis Dickinson, MD  Ascorbic Acid (VITAMIN C) 1000 MG tablet Take 1,000 mg by mouth daily.    Yes [provider]  DULoxetine (CYMBALTA) 30 MG capsule Take 1 capsule (30 mg total) by mouth daily. 12/02/16  Yes Nche, Charlene Brooke, NP  furosemide (LASIX) 40 MG tablet Take 40 mg by mouth daily.    Yes [provider]  rivaroxaban (XARELTO) 20 MG TABS tablet Take 1 tablet (20 mg total) by mouth daily with supper. 10/23/16  Yes Turner, Eber Hong, MD  spironolactone (ALDACTONE) 25 MG tablet Take 0.5 tablets (12.5 mg total) by mouth daily. 11/15/16 02/13/17 Yes Kilroy, Luke K, PA-C  vitamin B-12 (CYANOCOBALAMIN) 1000 MCG tablet Take 1,000 mcg by mouth daily.   Yes [provider]   Past Medical History:  Diagnosis  Date  . Anxiety   . Arthritis    "neck" (08/15/2015)  . Chronic bronchitis ([place])   . Chronic diastolic CHF (congestive heart failure) ([place])   . Constipation   . COPD (chronic obstructive  pulmonary disease) ([place])   . DDD (degenerative disc disease), cervical   . Depression   . Dilated aortic root ([place])    74m on echo 05/2016  . Dysrhythmia    A-Fib  cardioverted on 10-11-2016  . Edema of left lower extremity   . Facial basal cell cancer   . H/O acne vulgaris 1960s   [facility]  . Headache    history of - left temporal- years ago- not current (08/15/2015)  . Persistent atrial fibrillation (HCC)    on coumadin with CHADS2VASC score of 2  . Pneumonia 1960s; 2015 X 2  . Prostate cancer ([place] dx'd early 2000s   "low spreading; non aggressive type" (08/15/2015)  . [place]monary HTN ([place]    PASP 443mg by echo 05/2016  . Skin cancer    "back"   Past Surgical History:  Procedure Laterality Date  . CARDIOVERSION N/A 10/11/2016   Procedure: CARDIOVERSION;  Surgeon: CrSanda KleinMD;  [facility];  Service: Cardiovascular;  Laterality: N/A;  . COLONOSCOPY    . EXCISIONAL HEMORRHOIDECTOMY  1960s  . INGUINAL HERNIA REPAIR Right 2002  . INGUINAL HERNIA REPAIR Bilateral 08/15/2015  . INGUINAL HERNIA REPAIR Bilateral 08/15/2015   Procedure: OPEN REPAIR RECURRENT RIGHT INGUINAL HERNIA WITH MESH AND REPAIR LEFT INGUINAL HERNIA WITH MESH;  Surgeon: HaFanny SkatesMD;  [facility]: General;  Laterality: Bilateral;  . INSERTION OF MESH Bilateral 08/15/2015   Procedure: INSERTION OF MESH;  Surgeon: HaFanny SkatesMD;  [place] [facility]: General;  Laterality: Bilateral;  . IR THORACENTESIS ASP PLEURAL SPACE W/IMG GUIDE  11/18/2016  . LYMPH NODE BIOPSY Bilateral 10/22/2016   Procedure: LEFT NECK LYMPH NODE BIOPSY;  Surgeon: VaIvin PootMD;  [place] [facility]: Thoracic;  Laterality: Bilateral;  . PROSTATE BIOPSY    . TONSILLECTOMY  1950s  . VIDEO ASSISTED THORACOSCOPY (VATS)/EMPYEMA Right 05/20/2016   Procedure: VIDEO ASSISTED THORACOSCOPY (VATS) with drainage of pleural effusion;  Surgeon: PeIvin PootMD;   [place] [facility]: Thoracic;  Laterality: Right;  . Marland KitchenIDEO BRONCHOSCOPY WITH ENDOBRONCHIAL ULTRASOUND Right 05/20/2016   Procedure: VIDEO BRONCHOSCOPY WITH ENDOBRONCHIAL ULTRASOUND;  Surgeon: PeIvin PootMD;  [facility];  Service: Thoracic;  Laterality: Right;   Breast Cancer-relatedfamily history is not on file. Social History   Social History  . Marital status: Married    Spouse name: N/A  . Number of children: N/A  . Years of education: N/A   Occupational History  . Not on file.   Social History Main Topics  . Smoking status: Former Smoker    Packs/day: 1.00    Years: 62.00    Types: Cigarettes    Quit date: 05/14/2016  . Smokeless tobacco: Never Used  . Alcohol use No  . Drug use: No  . Sexual activity: Not Currently   Other Topics Concern  . Not on file   Social History Narrative  . No narrative on file   ROS-see HPI   + = Positive Constitutional:    + weight loss, night sweats, fevers, chills, fatigue, lassitude. HEENT:    headaches, difficulty swallowing, tooth/dental problems, sore throat,  sneezing, itching, ear ache, nasal congestion, post nasal drip, snoring CV:    chest pain, orthopnea, PND, swelling in lower extremities, anasarca,                                                       dizziness, palpitations Resp:   + shortness of breath with exertion or at rest.                productive cough,   non-productive cough, coughing up of blood.              change in color of mucus.  wheezing.   Skin:    rash or lesions. GI:  No-   heartburn, indigestion, abdominal pain, nausea, vomiting, diarrhea,                 change in bowel habits, loss of appetite GU: dysuria, change in color of urine, no urgency or frequency.   flank pain. MS:   joint pain, stiffness, decreased range of motion, back pain. Neuro-     nothing unusual Psych:  change in mood or affect.  depression or anxiety.   memory loss.  OBJ- Physical Exam General- Alert, Oriented,  Affect-appropriate, Distress- none acute, + wheelchair, + thin and bony Skin- rash-none, lesions- none, excoriation- none Lymphadenopathy- none Head- atraumatic            Eyes- Gross vision intact, PERRLA, conjunctivae and secretions clear            Ears- Hearing, canals-normal            Nose- Clear, no-Septal dev, mucus, polyps, erosion, perforation             Throat- Mallampati II , mucosa clear , drainage- none, tonsils- atrophic Neck- flexible , trachea midline, no stridor , thyroid nl, carotid no bruit Chest - symmetrical excursion , unlabored           Heart/CV- RRR , no murmur , no gallop  , no rub, nl s1 s2                           - JVD- none , edema- none, stasis changes- none, varices- none           Lung- clear to P&A, wheeze- none, cough- none , dullness + both bases, rub- none           Chest wall-  Abd-  Br/ Gen/ Rectal- Not done, not indicated Extrem- cyanosis- none, clubbing, none, atrophy- none, strength- nl Neuro- grossly intact to observation

## 2016-12-23 NOTE — Assessment & Plan Note (Signed)
There is residual bibasilar effusion. Recent CXR suggested diminished but recent PET scan suggested increased. Plan-CXR

## 2016-12-23 NOTE — Patient Instructions (Addendum)
Order- walk test on room air               Dx COPD mixed type, chronic hypoxic respiratory failure              Office Spirometry               Order [place] on room air   Order- CXR    Dx  Pleural effusion, bilateral             Lab- BMET, liver profile   Prevnar 13

## 2016-12-24 ENCOUNTER — Telehealth: Payer: Self-pay | Admitting: Nurse Practitioner

## 2016-12-24 DIAGNOSIS — M1990 Unspecified osteoarthritis, unspecified site: Secondary | ICD-10-CM | POA: Diagnosis not present

## 2016-12-24 DIAGNOSIS — Z87891 Personal history of nicotine dependence: Secondary | ICD-10-CM | POA: Diagnosis not present

## 2016-12-24 DIAGNOSIS — Z9981 Dependence on supplemental oxygen: Secondary | ICD-10-CM | POA: Diagnosis not present

## 2016-12-24 DIAGNOSIS — I480 Paroxysmal atrial fibrillation: Secondary | ICD-10-CM | POA: Diagnosis not present

## 2016-12-24 DIAGNOSIS — J449 Chronic obstructive pulmonary disease, unspecified: Secondary | ICD-10-CM | POA: Diagnosis not present

## 2016-12-24 DIAGNOSIS — Z792 Long term (current) use of antibiotics: Secondary | ICD-10-CM | POA: Diagnosis not present

## 2016-12-24 DIAGNOSIS — B953 Streptococcus pneumoniae as the cause of diseases classified elsewhere: Secondary | ICD-10-CM | POA: Diagnosis not present

## 2016-12-24 DIAGNOSIS — I110 Hypertensive heart disease with heart failure: Secondary | ICD-10-CM | POA: Diagnosis not present

## 2016-12-24 DIAGNOSIS — R7881 Bacteremia: Secondary | ICD-10-CM | POA: Diagnosis not present

## 2016-12-24 DIAGNOSIS — D630 Anemia in neoplastic disease: Secondary | ICD-10-CM | POA: Diagnosis not present

## 2016-12-24 DIAGNOSIS — I272 Pulmonary hypertension, unspecified: Secondary | ICD-10-CM | POA: Diagnosis not present

## 2016-12-24 DIAGNOSIS — C61 Malignant neoplasm of prostate: Secondary | ICD-10-CM | POA: Diagnosis not present

## 2016-12-24 DIAGNOSIS — I5032 Chronic diastolic (congestive) heart failure: Secondary | ICD-10-CM | POA: Diagnosis not present

## 2016-12-24 NOTE — Telephone Encounter (Signed)
Referral for nutrition can not be done because pt is not diabetic. Pt was wondering what else can we do about this to help [name]. Please advise.

## 2016-12-24 NOTE — Telephone Encounter (Signed)
Unfortunately I do not have any other suggestion with regards to nutrition referral. I encourage him to continue increasing protein intake through food, ensure (high protein) and/or muscle milk.

## 2016-12-25 ENCOUNTER — Other Ambulatory Visit: Payer: Self-pay | Admitting: Nurse Practitioner

## 2016-12-25 ENCOUNTER — Other Ambulatory Visit: Payer: Self-pay | Admitting: *Deleted

## 2016-12-25 ENCOUNTER — Telehealth: Payer: Self-pay | Admitting: Oncology

## 2016-12-25 ENCOUNTER — Telehealth: Payer: Self-pay | Admitting: Nurse Practitioner

## 2016-12-25 ENCOUNTER — Encounter: Payer: Self-pay | Admitting: Oncology

## 2016-12-25 DIAGNOSIS — I110 Hypertensive heart disease with heart failure: Secondary | ICD-10-CM | POA: Diagnosis not present

## 2016-12-25 DIAGNOSIS — R7881 Bacteremia: Secondary | ICD-10-CM | POA: Diagnosis not present

## 2016-12-25 DIAGNOSIS — R630 Anorexia: Secondary | ICD-10-CM

## 2016-12-25 DIAGNOSIS — I272 Pulmonary hypertension, unspecified: Secondary | ICD-10-CM | POA: Diagnosis not present

## 2016-12-25 DIAGNOSIS — E8809 Other disorders of plasma-protein metabolism, not elsewhere classified: Secondary | ICD-10-CM

## 2016-12-25 DIAGNOSIS — E46 Unspecified protein-calorie malnutrition: Secondary | ICD-10-CM

## 2016-12-25 DIAGNOSIS — Z792 Long term (current) use of antibiotics: Secondary | ICD-10-CM | POA: Diagnosis not present

## 2016-12-25 DIAGNOSIS — Z9981 Dependence on supplemental oxygen: Secondary | ICD-10-CM | POA: Diagnosis not present

## 2016-12-25 DIAGNOSIS — J449 Chronic obstructive pulmonary disease, unspecified: Secondary | ICD-10-CM | POA: Diagnosis not present

## 2016-12-25 DIAGNOSIS — Z87891 Personal history of nicotine dependence: Secondary | ICD-10-CM | POA: Diagnosis not present

## 2016-12-25 DIAGNOSIS — I480 Paroxysmal atrial fibrillation: Secondary | ICD-10-CM | POA: Diagnosis not present

## 2016-12-25 DIAGNOSIS — Z8546 Personal history of malignant neoplasm of prostate: Secondary | ICD-10-CM

## 2016-12-25 DIAGNOSIS — R634 Abnormal weight loss: Secondary | ICD-10-CM

## 2016-12-25 DIAGNOSIS — I5032 Chronic diastolic (congestive) heart failure: Secondary | ICD-10-CM | POA: Diagnosis not present

## 2016-12-25 DIAGNOSIS — C61 Malignant neoplasm of prostate: Secondary | ICD-10-CM | POA: Diagnosis not present

## 2016-12-25 DIAGNOSIS — R748 Abnormal levels of other serum enzymes: Secondary | ICD-10-CM

## 2016-12-25 DIAGNOSIS — B953 Streptococcus pneumoniae as the cause of diseases classified elsewhere: Secondary | ICD-10-CM | POA: Diagnosis not present

## 2016-12-25 DIAGNOSIS — R809 Proteinuria, unspecified: Secondary | ICD-10-CM

## 2016-12-25 DIAGNOSIS — M1990 Unspecified osteoarthritis, unspecified site: Secondary | ICD-10-CM | POA: Diagnosis not present

## 2016-12-25 DIAGNOSIS — D630 Anemia in neoplastic disease: Secondary | ICD-10-CM | POA: Diagnosis not present

## 2016-12-25 NOTE — Patient Outreach (Signed)
[place] [facility]) Care Management  12/25/2016  [name] March 12, 1946 [id number]   Transition of Care  RN spoke with consented wife [name]) who updates RN on pt's management of care. States pt has been losing weight daily for an unknown reason. States provider is aware of pt issues and has been  referred pt to a nephrologist and an oncologist currently pending appointments. Although pt is without symptoms of HF (GREEN zone) and weights reported for 8/13 at 142 lbs and today at 140 lbs and last week 145 lbs. States overall pt continue to have his usual thoraci pain (history from surgery). RN inquired on any needed resources has wife decline indicating none needed at this time.  Verified disposition with [facility] as PT has d/c's and nursing continues. States home O2 was also discontinued. RN reviewed the plan of care and verified pt has been adherent with his daily medications and all medical appointments with no delays. Strongly encouraged caregiver to report any abnormal symptoms to pt's provider and review the HF zones one again with the plan of care and goals. Alerted caregiver once again RN can visit if needed for community case management services however receptive to the follow up calls. RN offered to follow up once again next month completed the transition of care contact as spouse receptive and appreciative for the calls.  Will schedule and plan to receive an update at that time on pt's management of care and progress with his ongoing recovery. Will determine at that time if pt needs ongoing [facility] health coach base upon his pending consults this month. No other request or inquires at this time.  Christopher Mina, RN Care Management Coordinator [place] Office [phone]

## 2016-12-25 NOTE — Telephone Encounter (Signed)
Pt wife informed of referral .

## 2016-12-25 NOTE — Telephone Encounter (Signed)
Left vm for pt to call back, need to inform pt about the CT order by charlotte and the massage below.    Message  Received: Today  Message Contents  Nche, Charlene Brooke, NP  Shivan Hodes, LPN    Please inform patient that I ordered CT Abd/pelvis with contrast and referral to nephrology to further investigate other possible causes of weight loss and low hypoalbuminemia.

## 2016-12-25 NOTE — Telephone Encounter (Signed)
Appt has been scheduled for the pt to see Dr. Alen Blew on 8/23 at 2pm. Unable to reach the pt. Mailed a letter to the pt. Faxed a letter to Bethena Roys at [facility] to notify the pt.

## 2016-12-25 NOTE — Progress Notes (Signed)
Possible differential diagnosis to explain continuour weight loss, anorexia, and hypoalbuminemia.  1) Need f/up with urology appt for monitoring of prostate on PET scan done 12/12/2016.  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 Refer to GI after review of CT ABD/Pelvis.

## 2016-12-25 NOTE — Telephone Encounter (Signed)
error 

## 2016-12-25 NOTE — Telephone Encounter (Signed)
-----  Message from Flossie Buffy, NP sent at 12/25/2016  2:02 AM EDT ----- Please inform patient that I ordered CT Abd/pelvis with contrast and referral to nephrology to further investigate other possible causes of weight loss and low hypoalbuminemia.

## 2016-12-26 ENCOUNTER — Ambulatory Visit (INDEPENDENT_AMBULATORY_CARE_PROVIDER_SITE_OTHER)
Admission: RE | Admit: 2016-12-26 | Discharge: 2016-12-26 | Disposition: A | Discharge status: Home / Self Care | Payer: Medicare Other | Source: Ambulatory Visit | Attending: Nurse Practitioner | Admitting: Nurse Practitioner

## 2016-12-26 DIAGNOSIS — R630 Anorexia: Secondary | ICD-10-CM

## 2016-12-26 DIAGNOSIS — R634 Abnormal weight loss: Secondary | ICD-10-CM | POA: Diagnosis not present

## 2016-12-26 DIAGNOSIS — R748 Abnormal levels of other serum enzymes: Secondary | ICD-10-CM

## 2016-12-26 DIAGNOSIS — Z8546 Personal history of malignant neoplasm of prostate: Secondary | ICD-10-CM | POA: Diagnosis not present

## 2016-12-26 DIAGNOSIS — N281 Cyst of kidney, acquired: Secondary | ICD-10-CM | POA: Diagnosis not present

## 2016-12-26 MED ORDER — IOPAMIDOL (ISOVUE-300) INJECTION 61%
100.0000 mL | Freq: Once | INTRAVENOUS | Status: AC | PRN
  Administered 2016-12-26: 100 mL via INTRAVENOUS

## 2016-12-30 ENCOUNTER — Encounter: Payer: Self-pay | Admitting: Nurse Practitioner

## 2016-12-30 ENCOUNTER — Ambulatory Visit (INDEPENDENT_AMBULATORY_CARE_PROVIDER_SITE_OTHER): Payer: Medicare Other | Admitting: Nurse Practitioner

## 2016-12-30 VITALS — BP 80/60 | HR 87 | Temp 97.5°F | Ht 71.0 in | Wt 139.0 lb

## 2016-12-30 DIAGNOSIS — R630 Anorexia: Secondary | ICD-10-CM

## 2016-12-30 DIAGNOSIS — D630 Anemia in neoplastic disease: Secondary | ICD-10-CM | POA: Diagnosis not present

## 2016-12-30 DIAGNOSIS — I5032 Chronic diastolic (congestive) heart failure: Secondary | ICD-10-CM | POA: Diagnosis not present

## 2016-12-30 DIAGNOSIS — I480 Paroxysmal atrial fibrillation: Secondary | ICD-10-CM | POA: Diagnosis not present

## 2016-12-30 DIAGNOSIS — R634 Abnormal weight loss: Secondary | ICD-10-CM | POA: Diagnosis not present

## 2016-12-30 DIAGNOSIS — F4321 Adjustment disorder with depressed mood: Secondary | ICD-10-CM

## 2016-12-30 DIAGNOSIS — R600 Localized edema: Secondary | ICD-10-CM | POA: Diagnosis not present

## 2016-12-30 DIAGNOSIS — L0232 Furuncle of buttock: Secondary | ICD-10-CM | POA: Diagnosis not present

## 2016-12-30 DIAGNOSIS — E43 Unspecified severe protein-calorie malnutrition: Secondary | ICD-10-CM | POA: Diagnosis not present

## 2016-12-30 DIAGNOSIS — K5900 Constipation, unspecified: Secondary | ICD-10-CM

## 2016-12-30 DIAGNOSIS — R52 Pain, unspecified: Secondary | ICD-10-CM | POA: Insufficient documentation

## 2016-12-30 DIAGNOSIS — C61 Malignant neoplasm of prostate: Secondary | ICD-10-CM | POA: Diagnosis not present

## 2016-12-30 DIAGNOSIS — Z87891 Personal history of nicotine dependence: Secondary | ICD-10-CM | POA: Diagnosis not present

## 2016-12-30 DIAGNOSIS — R7881 Bacteremia: Secondary | ICD-10-CM | POA: Diagnosis not present

## 2016-12-30 DIAGNOSIS — J449 Chronic obstructive pulmonary disease, unspecified: Secondary | ICD-10-CM | POA: Diagnosis not present

## 2016-12-30 DIAGNOSIS — Z792 Long term (current) use of antibiotics: Secondary | ICD-10-CM | POA: Diagnosis not present

## 2016-12-30 DIAGNOSIS — I110 Hypertensive heart disease with heart failure: Secondary | ICD-10-CM | POA: Diagnosis not present

## 2016-12-30 DIAGNOSIS — Z9981 Dependence on supplemental oxygen: Secondary | ICD-10-CM | POA: Diagnosis not present

## 2016-12-30 DIAGNOSIS — I272 Pulmonary hypertension, unspecified: Secondary | ICD-10-CM | POA: Diagnosis not present

## 2016-12-30 DIAGNOSIS — M1990 Unspecified osteoarthritis, unspecified site: Secondary | ICD-10-CM | POA: Diagnosis not present

## 2016-12-30 DIAGNOSIS — B953 Streptococcus pneumoniae as the cause of diseases classified elsewhere: Secondary | ICD-10-CM | POA: Diagnosis not present

## 2016-12-30 MED ORDER — CEPHALEXIN 500 MG PO CAPS: 500.0000 mg | ORAL_CAPSULE | Freq: Three times a day (TID) | ORAL | 0 refills | Status: DC

## 2016-12-30 MED ORDER — DULOXETINE HCL 30 MG PO CPEP: 30.0000 mg | ORAL_CAPSULE | Freq: Every day | ORAL | 0 refills | Status: DC

## 2016-12-30 MED ORDER — POLYETHYLENE GLYCOL 3350 17 G PO PACK: 17.0000 g | PACK | Freq: Every day | ORAL | 0 refills | Status: DC | PRN

## 2016-12-30 MED ORDER — LACTULOSE 10 GM/15ML PO SOLN: 20.0000 g | Freq: Two times a day (BID) | ORAL | 0 refills | Status: DC | PRN

## 2016-12-30 MED ORDER — MIRTAZAPINE 15 MG PO TABS: 15.0000 mg | ORAL_TABLET | Freq: Every day | ORAL | 3 refills | Status: DC

## 2016-12-30 NOTE — Progress Notes (Signed)
Subjective:  Patient ID: [name], male    DOB: 1945/06/29  Age: 71 y.o. MRN: [id number]  CC: Follow-up (1 mo fu--boil on left butt cheek/ still not eatting good,still weak. afraid to eat due to gagging--has speech therapist appt--CT scan result. cymbalta and lasix consult? )   Rash  This is a new problem. The current episode started in the past 7 days. The problem is unchanged. The affected locations include the right buttock. The rash is characterized by redness and pain. He was exposed to nothing. Associated symptoms include fatigue. Pertinent negatives include no cough, diarrhea, shortness of breath or vomiting. Past treatments include nothing.   Prostate Cancer: appt with [name] 01/08/2017. [name]) Prostate PET done in last 42month stable per patient.  Weight loss: Continuous. No improvement in appetite. Gags with food intake  Chronic pain: Persistent. No improvement with cymbalta. Wife is concerned about possible side effect of additional weight loss. Wants medication changed [name], but reports inability to sleep generalized pain.  Constipation: Use of miralax x 2days with no results.  Edema: Resolved. Wife will like to know if lasix dose can be decreased.  Outpatient Medications Prior to Visit  Medication Sig Dispense Refill  . acetaminophen (TYLENOL) 325 MG tablet Take 2 tablets (650 mg total) by mouth every 6 (six) hours as needed for mild pain or fever.    . Ascorbic Acid (VITAMIN C) 1000 MG tablet Take 1,000 mg by mouth daily.     . rivaroxaban (XARELTO) 20 MG TABS tablet Take 1 tablet (20 mg total) by mouth daily with supper. 30 tablet 5  . spironolactone (ALDACTONE) 25 MG tablet Take 0.5 tablets (12.5 mg total) by mouth daily. 45 tablet 3  . vitamin B-12 (CYANOCOBALAMIN) 1000 MCG tablet Take 1,000 mcg by mouth daily.    . DULoxetine (CYMBALTA) 30 MG capsule Take 1 capsule (30 mg total) by mouth daily. 30  capsule 0  . furosemide (LASIX) 40 MG tablet Take 40 mg by mouth daily.      No facility-administered medications prior to visit.     ROS Review of Systems  Constitutional: Positive for fatigue, malaise/fatigue and weight loss.  Respiratory: Negative for cough and shortness of breath.   Cardiovascular: Negative for chest pain, palpitations and leg swelling.  Gastrointestinal: Positive for constipation and nausea. Negative for abdominal pain, blood in stool, diarrhea, melena and vomiting.  Genitourinary: Positive for frequency. Negative for dysuria, flank pain, hematuria and urgency.  Musculoskeletal: Positive for myalgias. Negative for falls.  Skin: Positive for rash.  Neurological: Positive for weakness. Negative for dizziness.  Psychiatric/Behavioral: Negative for depression, memory loss, substance abuse and suicidal ideas. The patient has insomnia. The patient is not nervous/anxious.     Objective:  BP (!) 80/60   Pulse 87   Temp (!) 97.5 F (36.4 C)   Ht _0  (1.803 m)   Wt 139 lb (63 kg)   SpO2 95%   BMI 19.39 kg/m   BP Readings from Last 3 Encounters:  12/30/16 (!) 80/60  12/23/16 110/60  12/11/16 (!) 91/55    Wt Readings from Last 3 Encounters:  12/30/16 139 lb (63 kg)  12/23/16 142 lb 6.4 oz (64.6 kg)  12/11/16 155 lb 12.8 oz (70.7 kg)    Physical Exam  Constitutional: He is oriented to person, place, and time. No distress.  Cardiovascular: Normal rate.   Pulmonary/Chest: Effort normal.  Musculoskeletal: Normal range of motion. He exhibits no edema.  Neurological: He is alert and oriented to person, place, and time.  Skin: Rash noted. Rash is pustular. There is erythema.     Psychiatric: He has a normal mood and affect. His behavior is normal. Thought content normal.  Vitals reviewed.   Lab Results  Component Value Date   WBC 8.6 12/02/2016   HGB 12.3 (L) 12/02/2016   HCT 38.4 (L) 12/02/2016   PLT 493.0 (H) 12/02/2016   GLUCOSE 88 12/23/2016    ALT 14 12/23/2016   AST 17 12/23/2016   NA 135 12/23/2016   K 4.0 12/23/2016   CL 100 12/23/2016   CREATININE 0.65 12/23/2016   BUN 27 (H) 12/23/2016   CO2 31 12/23/2016   TSH 1.662 11/18/2016   PSA 1.7 11/01/2016   INR 3.68 11/18/2016    Ct Abdomen Pelvis W Contrast  Result Date: 12/26/2016 CLINICAL DATA:  30 pound weight loss. Weakness. Decreased appetite. Low albumin. Prostate cancer with elevated PSA. EXAM: CT ABDOMEN AND PELVIS WITH CONTRAST TECHNIQUE: Multidetector CT imaging of the abdomen and pelvis was performed using the standard protocol following bolus administration of intravenous contrast. CONTRAST:  152m ISOVUE-300 IOPAMIDOL (ISOVUE-300) INJECTION 61% COMPARISON:  12/12/2016 F18 PET.  FDG PET of 10/31/2016. FINDINGS: Lower chest: Right base atelectasis. Small bilateral pleural effusions persist. Mild cardiomegaly. Elevated right heart pressure suspected, as evidenced by contrast reflux into the IVC and hepatic veins. Hepatobiliary: Heterogeneous hepatic enhancement, likely related to elevated right heart pressures. Right hepatic lobe 2.2 cm cyst inferiorly. Normal gallbladder, without biliary ductal dilatation. Pancreas: Normal, without mass or ductal dilatation. Spleen: Old granulomatous disease in the spleen. Adrenals/Urinary Tract: Normal adrenal glands. Well-circumscribed hypoattenuating right renal lesions are most consistent with cysts. Left sided renal sinus cysts. No hydronephrosis. Normal urinary bladder. Stomach/Bowel: Normal stomach, without wall thickening. Colonic stool burden suggests constipation. Normal terminal ileum. Normal small bowel. Vascular/Lymphatic: Advanced aortic and branch vessel atherosclerosis. Retroperitoneal adenopathy. An index aortocaval node measures 1.7 cm on image 26/series 2 versus 1.4 cm on 12/12/2016. Index node adjacent to the aortic bifurcation measures 1.7 x 1.7 cm on image 41/series 2 versus 2.1 x 1.5 cm on the prior. Right external iliac 1.5  cm node on image 56/series 2 is similar (when remeasured). Reproductive: Moderate prostatomegaly. Other: No significant free fluid. Anasarca. Nonspecific soft tissue thickening about the umbilicus on image 363/[id number]. Musculoskeletal: Degenerative partial fusion of the bilateral sacroiliac joints. Convex left lumbar spine curvature. Lumbosacral spondylosis. IMPRESSION: 1. Re- demonstration of abdominopelvic adenopathy. Similar to slightly increased compared to 12/12/2016. Differential considerations remain lymphoproliferative process or metastatic prostate cancer. 2.  Possible constipation. 3. Similar small bilateral pleural effusions. Suspect elevated right heart pressures. 4. Aortic Atherosclerosis (ICD10-I70.0). Electronically Signed   By: KAbigail MiyamotoM.D.   On: 12/26/2016 16:21    Assessment & Plan:   WAnshulwas seen today for follow-up.  Diagnoses and all orders for this visit:  Furuncle of buttock -     cephALEXin (KEFLEX) 500 MG capsule; Take 1 capsule (500 mg total) by mouth 3 (three) times daily.  Constipation, unspecified constipation type -     polyethylene glycol (MIRALAX / GLYCOLAX) packet; Take 17 g by mouth daily as needed. -     lactulose (CHRONULAC) 10 GM/15ML solution; Take 30 mLs (20 g total) by mouth 2 (two) times daily as needed for mild constipation.  Bilateral lower extremity edema  Adjustment disorder with depressed mood -     DULoxetine (CYMBALTA) 30 MG capsule; Take 1 capsule (30  mg total) by mouth daily. -     mirtazapine (REMERON) 15 MG tablet; Take 1 tablet (15 mg total) by mouth at bedtime.  Protein-calorie malnutrition, severe ([place]) -     Ambulatory referral to Gastroenterology  Abnormal intentional weight loss -     Ambulatory referral to Gastroenterology  Anorexia -     Ambulatory referral to Gastroenterology   I have changed Mr. Petro's furosemide. I am also having him start on polyethylene glycol, lactulose, cephALEXin, and mirtazapine.  Additionally, I am having him maintain his vitamin C, rivaroxaban, spironolactone, acetaminophen, vitamin B-12, and DULoxetine.  Meds ordered this encounter  Medications  . polyethylene glycol (MIRALAX / GLYCOLAX) packet    Sig: Take 17 g by mouth daily as needed.    Dispense:  14 each    Refill:  0    Order Specific Question:   Supervising Provider    Answer:   Cassandria Anger [[id number]]  . lactulose (CHRONULAC) 10 GM/15ML solution    Sig: Take 30 mLs (20 g total) by mouth 2 (two) times daily as needed for mild constipation.    Dispense:  240 mL    Refill:  0    Order Specific Question:   Supervising Provider    Answer:   Cassandria Anger [[id number]]  . DULoxetine (CYMBALTA) 30 MG capsule    Sig: Take 1 capsule (30 mg total) by mouth daily.    Dispense:  8 capsule    Refill:  0    Order Specific Question:   Supervising Provider    Answer:   Cassandria Anger [[id number]]  . cephALEXin (KEFLEX) 500 MG capsule    Sig: Take 1 capsule (500 mg total) by mouth 3 (three) times daily.    Dispense:  21 capsule    Refill:  0    Order Specific Question:   Supervising Provider    Answer:   Cassandria Anger [[id number]]  . mirtazapine (REMERON) 15 MG tablet    Sig: Take 1 tablet (15 mg total) by mouth at bedtime.    Dispense:  30 tablet    Refill:  3    Order Specific Question:   Supervising Provider    Answer:   Cassandria Anger [[id number]]  . furosemide (LASIX) 40 MG tablet    Sig: Take 0.5 tablets (20 mg total) by mouth every other day.    Dispense:  30 tablet    Order Specific Question:   Supervising Provider    Answer:   Cassandria Anger [[id number]]    Follow-up: Return in about 3 months (around 04/01/2017) for weight loss.  Wilfred Lacy, NP

## 2016-12-30 NOTE — Patient Instructions (Signed)
Wean off cymbalta: take 1cap every other day x 4days, then 1cap every 2days x 4days, then stop.  Start remeron as prescribed.  Skin Abscess A skin abscess is an infected area on or under your skin that contains pus and other material. An abscess can happen almost anywhere on your body. Some abscesses break open (rupture) on their own. Most continue to get worse unless they are treated. The infection can spread deeper into the body and into your blood, which can make you feel sick. Treatment usually involves draining the abscess. Follow these instructions at home: Abscess Care  If you have an abscess that has not drained, place a warm, clean, wet washcloth over the abscess several times a day. Do this as told by your doctor.  Follow instructions from your doctor about how to take care of your abscess. Make sure you: ? Cover the abscess with a bandage (dressing). ? Change your bandage or gauze as told by your doctor. ? Wash your hands with soap and water before you change the bandage or gauze. If you cannot use soap and water, use hand sanitizer.  Check your abscess every day for signs that the infection is getting worse. Check for: ? More redness, swelling, or pain. ? More fluid or blood. ? Warmth. ? More pus or a bad smell. Medicines   Take over-the-counter and prescription medicines only as told by your doctor.  If you were prescribed an antibiotic medicine, take it as told by your doctor. Do not stop taking the antibiotic even if you start to feel better. General instructions  To avoid spreading the infection: ? Do not share personal care items, towels, or hot tubs with others. ? Avoid making skin-to-skin contact with other people.  Keep all follow-up visits as told by your doctor. This is important. Contact a doctor if:  You have more redness, swelling, or pain around your abscess.  You have more fluid or blood coming from your abscess.  Your abscess feels warm when you  touch it.  You have more pus or a bad smell coming from your abscess.  You have a fever.  Your muscles ache.  You have chills.  You feel sick. Get help right away if:  You have very bad (severe) pain.  You see red streaks on your skin spreading away from the abscess. This information is not intended to replace advice given to you by your health care provider. Make sure you discuss any questions you have with your health care provider. Document Released: 10/16/2007 Document Revised: 12/24/2015 Document Reviewed: 03/08/2015 Elsevier Interactive Patient Education  [employer].

## 2016-12-30 NOTE — Assessment & Plan Note (Addendum)
Continues high protein diet. BMP Latest Ref Rng & Units 12/23/2016 12/02/2016 11/23/2016  Glucose 70 - 99 mg/dL 88 97 96  BUN 6 - 23 mg/dL 27(H) 27(H) 28(H)  Creatinine 0.40 - 1.50 mg/dL 0.65 0.82 0.93  BUN/Creat Ratio 10 - 24 - - -  Sodium 135 - 145 mEq/L 135 134(L) 131(L)  Potassium 3.5 - 5.1 mEq/L 4.0 4.1 3.5  Chloride 96 - 112 mEq/L 100 96 97(L)  CO2 19 - 32 mEq/L 31 34(H) 28  Calcium 8.4 - 10.5 mg/dL 8.4 8.3(L) 8.0(L)   Hepatic Function Latest Ref Rng & Units 12/23/2016 12/02/2016 11/20/2016  Total Protein 6.0 - 8.3 g/dL 5.9(L) 5.6(L) 6.2(L)  Albumin 3.5 - 5.2 g/dL 2.4(L) 2.1(L) 1.4(L)  AST 0 - 37 U/L 17 23 37  ALT 0 - 53 U/L _0 Alk Phosphatase 39 - 117 U/L 194(H) 220(H) 245(H)  Total Bilirubin 0.2 - 1.2 mg/dL 0.4 0.4 0.8  Bilirubin, Direct 0.0 - 0.3 mg/dL 0.4(H) - -

## 2016-12-30 NOTE — Assessment & Plan Note (Signed)
Wean off cymbalta over 8days. Start remeron Consider use of seroquel if no improvement with remeron

## 2016-12-30 NOTE — Assessment & Plan Note (Signed)
Decreased food intake due to sensation of gagging with food intake per patient. Wife encourages small frequent meals and ensure (high protien) or muscle milk supplement. PET scan indicates possible prostate cancer with mets? CT ABD/pelvis: Re- demonstration of abdominopelvic adenopathy. Similar to slightly increased compared to 12/12/2016. Differential considerations remain lymphoproliferative process or metastatic prostate cancer. Has appt with oncology this month. Entered referral to GI.

## 2016-12-31 ENCOUNTER — Ambulatory Visit: Payer: Medicare Other | Admitting: Nurse Practitioner

## 2016-12-31 ENCOUNTER — Telehealth: Payer: Self-pay | Admitting: Nurse Practitioner

## 2016-12-31 DIAGNOSIS — R634 Abnormal weight loss: Secondary | ICD-10-CM

## 2016-12-31 DIAGNOSIS — R198 Other specified symptoms and signs involving the digestive system and abdomen: Secondary | ICD-10-CM

## 2016-12-31 NOTE — Telephone Encounter (Signed)
Notified Minda w/[place] response. Needing referral place to be done at [facility]. Referral entered...[name]

## 2016-12-31 NOTE — Telephone Encounter (Signed)
Needs verbals for speech therapy for 2week2  Also would like a referral for a modified barium swollow study  Please advise

## 2016-12-31 NOTE — Telephone Encounter (Signed)
[facility] for speech and barium swallow test

## 2017-01-01 ENCOUNTER — Encounter: Payer: Self-pay | Admitting: Gastroenterology

## 2017-01-01 ENCOUNTER — Ambulatory Visit (INDEPENDENT_AMBULATORY_CARE_PROVIDER_SITE_OTHER): Payer: Medicare Other | Admitting: Gastroenterology

## 2017-01-01 VITALS — BP 66/42 | HR 80 | Ht 70.47 in | Wt 139.0 lb

## 2017-01-01 DIAGNOSIS — R634 Abnormal weight loss: Secondary | ICD-10-CM | POA: Diagnosis not present

## 2017-01-01 DIAGNOSIS — R630 Anorexia: Secondary | ICD-10-CM

## 2017-01-01 DIAGNOSIS — K5900 Constipation, unspecified: Secondary | ICD-10-CM | POA: Diagnosis not present

## 2017-01-01 NOTE — Patient Instructions (Addendum)
You can take over the counter Miralax starting at three times a day until your bowels are more regular then titrate to 1-3 x daily.   Contact your primary care physician regarding your blood pressure.   Thank you for choosing me and [facility].  Pricilla Riffle. Dagoberto Ligas., MD., Marval Regal

## 2017-01-01 NOTE — Progress Notes (Signed)
History of Present Illness: This is a 71 year old male referred by [name], Charlene Brooke, NP for the evaluation of weight loss, anorexia. He is accompanied by his wife. Hospitalized for PNA in 06/4399 complicated by protein calorie malnutrition, COPD, pulmonary hypertension, CHF, afib, pleural effusion, anasarca, prostate cancer. He has been hospitalized several times since 08/2015. He relates a 40 pound weight loss in a depressed appetite for 8-9 months. He relates a colonoscopy performed about 8 years ago in [place], [place]. He has ongoing problems with constipation and small pellet-like stools. Denies abdominal pain, diarrhea, change in stool caliber, melena, hematochezia, nausea, vomiting, dysphagia, reflux symptoms, chest pain.   PET: NECK, CHEST, ABDOMEN, PELVIS 12/12/2016 IMPRESSION: 1. No clear evidence of prostate cancer nodal metastasis by Fluciclovine imaging. No significant radiotracer accumulation within the enlarged subcarinal lymph node, periaortic lymph nodes or deep RIGHT pelvis lymph nodes. 2. Diffuse uptake within the prostate gland greater on the RIGHT. Differential includes prostate hypertrophy versus residual carcinoma. 3. No evidence skeletal metastasis 4. Interval increase in bilateral pleural effusions.  Abd/pelvic CT 12/26/2016 IMPRESSION: 1. Re- demonstration of abdominopelvic adenopathy. Similar to slightly increased compared to 12/12/2016. Differential considerations remain lymphoproliferative process or metastatic prostate cancer. 2.  Possible constipation. 3. Similar small bilateral pleural effusions. Suspect elevated right heart pressures. 4. Aortic Atherosclerosis (ICD10-I70.0).   Allergies  Allergen Reactions  . Demerol [Meperidine] Other (See Comments)    UNSPECIFIED REACTION  Causes system to shutdown. ?   . Oxycodone Hcl Other (See Comments)    Pt states this medication 'wires him up' and makes pt hyper; pt does not want to take this ever again    Outpatient Medications Prior to Visit  Medication Sig Dispense Refill  . acetaminophen (TYLENOL) 325 MG tablet Take 2 tablets (650 mg total) by mouth every 6 (six) hours as needed for mild pain or fever.    . Ascorbic Acid (VITAMIN C) 1000 MG tablet Take 1,000 mg by mouth daily.     . cephALEXin (KEFLEX) 500 MG capsule Take 1 capsule (500 mg total) by mouth 3 (three) times daily. 21 capsule 0  . DULoxetine (CYMBALTA) 30 MG capsule Take 1 capsule (30 mg total) by mouth daily. 8 capsule 0  . furosemide (LASIX) 40 MG tablet Take 0.5 tablets (20 mg total) by mouth every other day. 30 tablet   . lactulose (CHRONULAC) 10 GM/15ML solution Take 30 mLs (20 g total) by mouth 2 (two) times daily as needed for mild constipation. 240 mL 0  . mirtazapine (REMERON) 15 MG tablet Take 1 tablet (15 mg total) by mouth at bedtime. 30 tablet 3  . polyethylene glycol (MIRALAX / GLYCOLAX) packet Take 17 g by mouth daily as needed. 14 each 0  . rivaroxaban (XARELTO) 20 MG TABS tablet Take 1 tablet (20 mg total) by mouth daily with supper. 30 tablet 5  . spironolactone (ALDACTONE) 25 MG tablet Take 0.5 tablets (12.5 mg total) by mouth daily. 45 tablet 3  . vitamin B-12 (CYANOCOBALAMIN) 1000 MCG tablet Take 1,000 mcg by mouth daily.     No facility-administered medications prior to visit.    Past Medical History:  Diagnosis Date  . Anxiety   . Arthritis    "neck" (08/15/2015)  . Chronic bronchitis ([place])   . Chronic diastolic CHF (congestive heart failure) ([place])   . Constipation   . COPD (chronic obstructive pulmonary disease) ([place])   . DDD (degenerative disc disease), cervical   . Depression   .  Dilated aortic root ([place])    50m on echo 05/2016  . Dysrhythmia    A-Fib  cardioverted on 10-11-2016  . Edema of left lower extremity   . Facial basal cell cancer   . H/O acne vulgaris 1960s   [facility]  . Headache    history of - left temporal- years ago- not current  (08/15/2015)  . Persistent atrial fibrillation (H[facility])    on coumadin with CHADS2VASC score of 2  . Pneumonia 1960s; 2015 X 2  . Prostate cancer ([facility] early 2000[facility]   "low spreading; non aggressive type" (08/15/2015)  . Pulmonary HTN ([place]    PASP 459mg by echo 05/2016  . Skin cancer    "back"   Past Surgical History:  Procedure Laterality Date  . CARDIOVERSION N/A 10/11/2016   Procedure: CARDIOVERSION;  Surgeon: CrSanda KleinMD;  Location: [facility];  Service: Cardiovascular;  Laterality: N/A;  . COLONOSCOPY    . EXCISIONAL HEMORRHOIDECTOMY  1960s  . INGUINAL HERNIA REPAIR Right 2002  . INGUINAL HERNIA REPAIR Bilateral 08/15/2015  . INGUINAL HERNIA REPAIR Bilateral 08/15/2015   Procedure: OPEN REPAIR RECURRENT RIGHT INGUINAL HERNIA WITH MESH AND REPAIR LEFT INGUINAL HERNIA WITH MESH;  Surgeon: HaFanny SkatesMD;  [facility] Service: General;  Laterality: Bilateral;  . INSERTION OF MESH Bilateral 08/15/2015   Procedure: INSERTION OF MESH;  Surgeon: HaFanny SkatesMD;  [place]: [facility] Service: General;  Laterality: Bilateral;  . IR THORACENTESIS ASP PLEURAL SPACE W/IMG GUIDE  11/18/2016  . LYMPH NODE BIOPSY Bilateral 10/22/2016   Procedure: LEFT NECK LYMPH NODE BIOPSY;  Surgeon: VaIvin PootMD;  [facility] Service: Thoracic;  Laterality: Bilateral;  . PROSTATE BIOPSY    . TONSILLECTOMY  1950s  . VIDEO ASSISTED THORACOSCOPY (VATS)/EMPYEMA Right 05/20/2016   Procedure: VIDEO ASSISTED THORACOSCOPY (VATS) with drainage of pleural effusion;  Surgeon: PeIvin PootMD;  [facility] Service: Thoracic;  Laterality: Right;  . Marland KitchenIDEO BRONCHOSCOPY WITH ENDOBRONCHIAL ULTRASOUND Right 05/20/2016   Procedure: VIDEO BRONCHOSCOPY WITH ENDOBRONCHIAL ULTRASOUND;  Surgeon: PeIvin PootMD;  [facility];  Service: Thoracic;  Laterality: Right;   Social History   Social History  . Marital status: Married    Spouse name: N/A  . Number of children: N/A  . Years of  education: N/A   Social History Main Topics  . Smoking status: Former Smoker    Packs/day: 1.00    Years: 62.00    Types: Cigarettes    Quit date: 05/14/2016  . Smokeless tobacco: Never Used  . Alcohol use No  . Drug use: No     Comment: CBD OIL  . Sexual activity: Not Currently    Partners: Female   Other Topics Concern  . None   Social History Narrative  . None   Family History  Problem Relation Age of Onset  . Colon cancer Mother   . Ovarian cancer Mother   . Alcoholism Father   . CAD Father   . Alcohol abuse Father   . Ovarian cancer Sister   . Diabetes Neg Hx   . Stomach cancer Neg Hx       Review of Systems: Pertinent positive and negative review of systems were noted in the above HPI section. All other review of systems were otherwise negative.    Physical Exam: General: Well developed, well nourished, Thin, chronically ill-appearing, no acute distress Head:  Normocephalic and atraumatic Eyes:  sclerae anicteric, EOMI Ears: Normal auditory acuity Mouth: No deformity or lesions Neck: Supple, no masses or thyromegaly Lungs: Clear throughout to auscultation Heart: Regular rate and rhythm; no murmurs, rubs or bruits Abdomen: Soft, non tender and non distended. No masses, hepatosplenomegaly or hernias noted. Normal Bowel sounds Musculoskeletal: Symmetrical with no gross deformities  Skin: No lesions on visible extremities Pulses:  Normal pulses noted Extremities: No clubbing, cyanosis, edema or deformities noted Neurological: Alert oriented x 4, grossly nonfocal Cervical Nodes:  No significant cervical adenopathy Inguinal Nodes: No significant inguinal adenopathy Psychological:  Alert and cooperative. Normal mood and affect  Assessment and Recommendations:  1. Weight loss and anorexia. Likely secondary to COPD, pulmonary hypertension and multiple hospitalizations over the past year. Abdominal/pelvic CT scan and PET scan did not reveal any significant  gastrointestinal pathology. Consider EGD and consider colonoscopy if symptoms do not improve. He is not fit to undergo procedures at this time with hypotension, generalized deconditioning. Will attempt to obtain records from his prior colonoscopy in [place]. He has an appointment with Dr. Alen Blew tomorrow. REV in 2 months.   2. Hypotension without orthostasis or dizziness. BP=66/42. Advised to contact his PCP today.  3. Constipation. Increase MiraLAX to 3 times daily for the next several days and then titrate dose between 1 and 3 times a day for adequate bowel movements. REV in 2 months.   4. Protein calorie malnutrition. Nutritional supplements 3 times a day between meals. Follow-up with PCP.  cc: Flossie Buffy, NP [place] [address] [place], [place] [id number]

## 2017-01-02 ENCOUNTER — Ambulatory Visit (HOSPITAL_BASED_OUTPATIENT_CLINIC_OR_DEPARTMENT_OTHER): Payer: Medicare Other | Admitting: Oncology

## 2017-01-02 ENCOUNTER — Telehealth: Payer: Self-pay | Admitting: Nurse Practitioner

## 2017-01-02 ENCOUNTER — Telehealth: Payer: Self-pay | Admitting: Cardiology

## 2017-01-02 VITALS — BP 88/61 | HR 90 | Temp 97.7°F | Resp 18 | Ht 70.47 in | Wt 139.7 lb

## 2017-01-02 DIAGNOSIS — R634 Abnormal weight loss: Secondary | ICD-10-CM

## 2017-01-02 DIAGNOSIS — R627 Adult failure to thrive: Secondary | ICD-10-CM | POA: Diagnosis not present

## 2017-01-02 DIAGNOSIS — Z8546 Personal history of malignant neoplasm of prostate: Secondary | ICD-10-CM

## 2017-01-02 DIAGNOSIS — C61 Malignant neoplasm of prostate: Secondary | ICD-10-CM

## 2017-01-02 DIAGNOSIS — R591 Generalized enlarged lymph nodes: Secondary | ICD-10-CM | POA: Diagnosis not present

## 2017-01-02 DIAGNOSIS — I5033 Acute on chronic diastolic (congestive) heart failure: Secondary | ICD-10-CM

## 2017-01-02 DIAGNOSIS — R630 Anorexia: Secondary | ICD-10-CM | POA: Diagnosis not present

## 2017-01-02 DIAGNOSIS — R109 Unspecified abdominal pain: Secondary | ICD-10-CM | POA: Diagnosis not present

## 2017-01-02 NOTE — Telephone Encounter (Signed)
New message   Pt c/o BP issue: STAT if pt c/o blurred vision, one-sided weakness or slurred speech  1. What are your last 5 BP readings? bp is in the 60's and very low per pt  2. Are you having any other symptoms (ex. Dizziness, headache, blurred vision, passed out)? no  3. What is your BP issue? Pt bp is low, wants to take off LASIX

## 2017-01-02 NOTE — Progress Notes (Signed)
Reason for Referral: Prostate cancer and lymphadenopathy.   HPI: 71 year old gentleman currently of [place] but lived multiple locations and native of [place]. She is a retired Radiographer, therapeutic but also served in [employer] and receives some care at [facility] including medications at times. He has history of COPD, atrial fibrillation among other comorbid conditions. He was diagnosed with prostate cancer in 2012 after presenting with a PSA of 7.1 and found to have a 1 core of Gleason 6 disease. He has been on observation and surveillance without any evidence to suggest recurrent or advanced disease. His most recent PSA in June 2018 was 1.7. Biopsy in 2014 showed Gleason score 6 without any major changes. He was found to have borderline lymphadenopathy dating back to January 2018. He had a mediastinal adenopathy that was borderline detected on a CT scan. A repeat CT scan on 05/17/2016 showed persistent enlarged mediastinal lymph nodes and a left internal mammary lymph node. Differential considerations include lymphoproliferative disorder, granulomatous inflammation/infection or reactive adenopathy.   PET CT scan obtained on 10/31/2016 showed a axillary lymph node measuring 13 mm, subcarinal nodal mass measuring 2.7 cm with an SUV of 2.4. There is also adenopathy within the abdomen and pelvis that is primarily non-hypermetabolic with low level hypermetabolism within the subcarinal mass. These findings were suspicious for metastatic disease from prostate cancer although low-grade lymphoma felt less likely.  A lymph node biopsy of the left cervical chain obtained on 10/31/2016 showed benign findings.  In July 2018 he was hospitalized for Streptococcus pneumonia and bacteremia between July 8 and 11/24/2016. At that time CT scan of the chest did show axillary and mediastinal adenopathy.   Axumin PET scan was done on 12/12/2016 showed no clear evidence of prostate cancer known metastasis. No significant  radiotracer accumulation within the subcarinal lymph node or the periaortic lymph nodes.  Clinically, [name] continues to struggle with vague complaints of abdominal pain and failure to thrive. He does report increased in his recent mobility and more stabilization in his blood pressure. He does have periodic hypotension and fatigue associated with it. His weight has been stable in the last few weeks.  He does not report any headaches, blurry vision, syncope or seizures. He does not report any fevers or chills or sweats. He does not report any cough, wheezing or hemoptysis. He does not report any nausea, vomiting or hematochezia. He does not report any frequency urgency or hesitancy. He is not report any skeletal complaints. Remaining review of systems unremarkable.      Past Medical History:  Diagnosis Date  . Anxiety   . Arthritis    "neck" (08/15/2015)  . Chronic bronchitis ([place])   . Chronic diastolic CHF (congestive heart failure) ([place])   . Constipation   . COPD (chronic obstructive pulmonary disease) ([place])   . DDD (degenerative disc disease), cervical   . Depression   . Dilated aortic root ([place])    71m on echo 05/2016  . Dysrhythmia    A-Fib  cardioverted on 10-11-2016  . Edema of left lower extremity   . Facial basal cell cancer   . H/O acne vulgaris 1960s   [facility]  . Headache    history of - left temporal- years ago- not current (08/15/2015)  . Persistent atrial fibrillation (HCC)    on coumadin with CHADS2VASC score of 2  . Pneumonia 1960s; 2015 X 2  . Prostate cancer ([place] dx'd early 2000s   "  low spreading; non aggressive type" (08/15/2015)  . Pulmonary HTN ([place])    PASP 71mHg by echo 05/2016  . Skin cancer    "back"  :  Past Surgical History:  Procedure Laterality Date  . CARDIOVERSION N/A 10/11/2016   Procedure: CARDIOVERSION;  Surgeon: CSanda Klein MD;  [facility];  Service: Cardiovascular;  Laterality:  N/A;  . COLONOSCOPY    . EXCISIONAL HEMORRHOIDECTOMY  1960s  . INGUINAL HERNIA REPAIR Right 2002  . INGUINAL HERNIA REPAIR Bilateral 08/15/2015  . INGUINAL HERNIA REPAIR Bilateral 08/15/2015   Procedure: OPEN REPAIR RECURRENT RIGHT INGUINAL HERNIA WITH MESH AND REPAIR LEFT INGUINAL HERNIA WITH MESH;  Surgeon: HFanny Skates MD;  [place]  Service: General;  Laterality: Bilateral;  . INSERTION OF MESH Bilateral 08/15/2015   Procedure: INSERTION OF MESH;  Surgeon: HFanny Skates MD;  [place]  Service: General;  Laterality: Bilateral;  . IR THORACENTESIS ASP PLEURAL SPACE W/IMG GUIDE  11/18/2016  . LYMPH NODE BIOPSY Bilateral 10/22/2016   Procedure: LEFT NECK LYMPH NODE BIOPSY;  Surgeon: VIvin Poot MD;  [place] [facility] [place]  Service: Thoracic;  Laterality: Bilateral;  . PROSTATE BIOPSY    . TONSILLECTOMY  1950s  . VIDEO ASSISTED THORACOSCOPY (VATS)/EMPYEMA Right 05/20/2016   Procedure: VIDEO ASSISTED THORACOSCOPY (VATS) with drainage of pleural effusion;  Surgeon: PIvin Poot MD;  [place] [facility]  Service: Thoracic;  Laterality: Right;  .Marland KitchenVIDEO BRONCHOSCOPY WITH ENDOBRONCHIAL ULTRASOUND Right 05/20/2016   Procedure: VIDEO BRONCHOSCOPY WITH ENDOBRONCHIAL ULTRASOUND;  Surgeon: PIvin Poot MD;  [facility]  Service: Thoracic;  Laterality: Right;  :   Current Outpatient Prescriptions:  .  acetaminophen (TYLENOL) 325 MG tablet, Take 2 tablets (650 mg total) by mouth every 6 (six) hours as needed for mild pain or fever., Disp: , Rfl:  .  Ascorbic Acid (VITAMIN C) 1000 MG tablet, Take 1,000 mg by mouth daily. , Disp: , Rfl:  .  cephALEXin (KEFLEX) 500 MG capsule, Take 1 capsule (500 mg total) by mouth 3 (three) times daily., Disp: 21 capsule, Rfl: 0 .  DULoxetine (CYMBALTA) 30 MG capsule, Take 1 capsule (30 mg total) by mouth daily., Disp: 8 capsule, Rfl: 0 .  furosemide (LASIX) 40 MG tablet, Take 0.5 tablets (20 mg total) by mouth every other day., Disp: 30 tablet, Rfl:  .   lactulose (CHRONULAC) 10 GM/15ML solution, Take 30 mLs (20 g total) by mouth 2 (two) times daily as needed for mild constipation., Disp: 240 mL, Rfl: 0 .  mirtazapine (REMERON) 15 MG tablet, Take 1 tablet (15 mg total) by mouth at bedtime., Disp: 30 tablet, Rfl: 3 .  polyethylene glycol (MIRALAX / GLYCOLAX) packet, Take 17 g by mouth daily as needed., Disp: 14 each, Rfl: 0 .  rivaroxaban (XARELTO) 20 MG TABS tablet, Take 1 tablet (20 mg total) by mouth daily with supper., Disp: 30 tablet, Rfl: 5 .  vitamin B-12 (CYANOCOBALAMIN) 1000 MCG tablet, Take 1,000 mcg by mouth daily., Disp: , Rfl: :  Allergies  Allergen Reactions  . Demerol [Meperidine] Other (See Comments)    UNSPECIFIED REACTION  Causes system to shutdown. ?   . Oxycodone Hcl Other (See Comments)    Pt states this medication 'wires him up' and makes pt hyper; pt does not want to take this ever again  :  Family History  Problem Relation Age of Onset  . Colon cancer Mother   . Ovarian cancer Mother   . Alcoholism Father   .  CAD Father   . Alcohol abuse Father   . Ovarian cancer Sister   . Diabetes Neg Hx   . Stomach cancer Neg Hx   :  Social History   Social History  . Marital status: Married    Spouse name: N/A  . Number of children: N/A  . Years of education: N/A   Occupational History  . Not on file.   Social History Main Topics  . Smoking status: Former Smoker    Packs/day: 1.00    Years: 62.00    Types: Cigarettes    Quit date: 05/14/2016  . Smokeless tobacco: Never Used  . Alcohol use No  . Drug use: No     Comment: CBD OIL  . Sexual activity: Not Currently    Partners: Female   Other Topics Concern  . Not on file   Social History Narrative  . No narrative on file  :  Pertinent items are noted in HPI.  Exam: Blood pressure (!) 88/61, pulse 90, temperature 97.7 F (36.5 C), temperature source Oral, resp. rate 18, height 5' 10.47" (1.79 m), weight 139 lb 11.2 oz (63.4 kg), SpO2 98 %.  ECOG 1   General appearance: Chronically ill appearing gentleman appeared without distress today. Throat: No oral thrush or ulcers. Neck: no adenopathy Back: negative Resp: clear to auscultation bilaterally Chest wall: no tenderness Cardio: regular rate and rhythm, S1, S2 normal, no murmur, click, rub or gallop GI: soft, non-tender; bowel sounds normal; no masses,  no organomegaly Extremities: extremities normal, atraumatic, no cyanosis or edema Pulses: 2+ and symmetric  CBC    Component Value Date/Time   WBC 8.6 12/02/2016 1057   RBC 4.14 (L) 12/02/2016 1057   HGB 12.3 (L) 12/02/2016 1057   HGB 13.8 10/04/2016 1134   HCT 38.4 (L) 12/02/2016 1057   HCT 41.5 10/04/2016 1134   PLT 493.0 (H) 12/02/2016 1057   PLT 469 (H) 10/04/2016 1134   MCV 92.8 12/02/2016 1057   MCV 90 10/04/2016 1134   MCH 29.3 11/22/2016 0318   MCHC 32.0 12/02/2016 1057   RDW 14.1 12/02/2016 1057   RDW 14.9 10/04/2016 1134   LYMPHSABS 1.3 12/02/2016 1057   MONOABS 1.4 (H) 12/02/2016 1057   EOSABS 0.1 12/02/2016 1057   BASOSABS 0.1 12/02/2016 1057     Ct Abdomen Pelvis W Contrast  Result Date: 12/26/2016 CLINICAL DATA:  30 pound weight loss. Weakness. Decreased appetite. Low albumin. Prostate cancer with elevated PSA. EXAM: CT ABDOMEN AND PELVIS WITH CONTRAST TECHNIQUE: Multidetector CT imaging of the abdomen and pelvis was performed using the standard protocol following bolus administration of intravenous contrast. CONTRAST:  152m ISOVUE-300 IOPAMIDOL (ISOVUE-300) INJECTION 61% COMPARISON:  12/12/2016 F18 PET.  FDG PET of 10/31/2016. FINDINGS: Lower chest: Right base atelectasis. Small bilateral pleural effusions persist. Mild cardiomegaly. Elevated right heart pressure suspected, as evidenced by contrast reflux into the IVC and hepatic veins. Hepatobiliary: Heterogeneous hepatic enhancement, likely related to elevated right heart pressures. Right hepatic lobe 2.2 cm cyst inferiorly. Normal gallbladder, without  biliary ductal dilatation. Pancreas: Normal, without mass or ductal dilatation. Spleen: Old granulomatous disease in the spleen. Adrenals/Urinary Tract: Normal adrenal glands. Well-circumscribed hypoattenuating right renal lesions are most consistent with cysts. Left sided renal sinus cysts. No hydronephrosis. Normal urinary bladder. Stomach/Bowel: Normal stomach, without wall thickening. Colonic stool burden suggests constipation. Normal terminal ileum. Normal small bowel. Vascular/Lymphatic: Advanced aortic and branch vessel atherosclerosis. Retroperitoneal adenopathy. An index aortocaval node measures 1.7 cm on image 26/series 2  versus 1.4 cm on 12/12/2016. Index node adjacent to the aortic bifurcation measures 1.7 x 1.7 cm on image 41/series 2 versus 2.1 x 1.5 cm on the prior. Right external iliac 1.5 cm node on image 56/series 2 is similar (when remeasured). Reproductive: Moderate prostatomegaly. Other: No significant free fluid. Anasarca. Nonspecific soft tissue thickening about the umbilicus on image 77/OEUMPN 2. Musculoskeletal: Degenerative partial fusion of the bilateral sacroiliac joints. Convex left lumbar spine curvature. Lumbosacral spondylosis. IMPRESSION: 1. Re- demonstration of abdominopelvic adenopathy. Similar to slightly increased compared to 12/12/2016. Differential considerations remain lymphoproliferative process or metastatic prostate cancer. 2.  Possible constipation. 3. Similar small bilateral pleural effusions. Suspect elevated right heart pressures. 4. Aortic Atherosclerosis (ICD10-I70.0). Electronically Signed   By: Abigail Miyamoto M.D.   On: 12/26/2016 16:21   Nm Pet (axumin) Skull Base To Mid Thigh  Result Date: 12/12/2016 CLINICAL DATA:  Prostate carcinoma with biochemical recurrence. PSA equal 1.7. EXAM: NUCLEAR MEDICINE PET SKULL BASE TO THIGH TECHNIQUE: mCi F-18 Fluciclovine was injected intravenously. Full-ring PET imaging was performed from the skull base to thigh after the  radiotracer. CT data was obtained and used for attenuation correction and anatomic localization. COMPARISON:  None. FINDINGS: NECK No radiotracer activity in neck lymph nodes. CHEST No significant radiotracer accumulation within the large subcarinal mass measuring 3.8 cm short axis. Mass lesion is increased in size from 2.7 cm on comparison exam (PET-CT 10/30/2016). There are bilateral pleural effusions which are increased . Mild associated basilar atelectasis. ABDOMEN/PELVIS No significant radiotracer activity associated with the periaortic lymph nodes or pelvic lymph nodes. For example LEFT periaortic lymph node just above the bifurcation measures 13 mm short axis) compared to 13 mm on prior. No significant radiotracer accumulation. Lymph node in the deep RIGHT operator space measures 11 mm short axis (image 156, series 4) compared with 18 mm. Again no radiotracer accumulation with a prostate specific imaging agent. In the prostate gland itself, there is diffuse uptake which is moderate intensity with SUV max equal 4.5. There is greater activity on the RIGHT than LEFT. SKELETON No focal  activity to suggest skeletal metastasis. IMPRESSION: 1. No clear evidence of prostate cancer nodal metastasis by Fluciclovine imaging. No significant radiotracer accumulation within the enlarged subcarinal lymph node, periaortic lymph nodes or deep RIGHT pelvis lymph nodes. 2. Diffuse uptake within the prostate gland greater on the RIGHT. Differential includes prostate hypertrophy versus residual carcinoma. 3. No evidence skeletal metastasis 4. Interval increase in bilateral pleural effusions. Electronically Signed   By: Suzy Bouchard M.D.   On: 12/12/2016 16:12    Assessment and Plan:    71 year old gentleman with the following issues:  1. Lymphadenopathy diagnosed in January 2018. He had multiple imaging studies including CT scan and a PET scan with size of lymphadenopathy ranges between 1.1 cm to 3.8 cm and the  subcarinal area that has increased from 2.7 cm between June and August 2018. He had a lymph node biopsy in June 2018 that was nondiagnostic. Axumin scan obtained in August 2018 showed no radiotracer uptake ruling out prostate cancer as a possibility. The PET scan also did not show any evidence of other malignancy.  The differential diagnosis was discussed today with the patient and his wife. His lymphadenopathy could be related to a low grade lymphoproliferative disorder. His CBC did not show any lymphocytosis to suggest CLL or mantle cell lymphoma. Other causes such as follicular lymphoma could be also a consideration. Nonmalignant etiologies such as sarcoidosis, fungal infection and other reactive  lymphadenopathy could also be a possibility.  From management standpoint I recommended continuous monitoring and repeat imaging studies in 4-6 months. If he continues to have enlarging lymphadenopathy then a biopsy will be needed. Bronchoscopy and transbronchial biopsy or EBUS would be the next step at that time. If he develops clinical deterioration in the interim, this can be expedited.  2. The prostate cancer: This appears to be a low-grade malignancy diagnosed in 2012. He presented with a Gleason score of 6 and his PSA continues to decline. PSA in July 2018 was 1.7. He continues to follow Dr. Jeffie Pollock regarding this issue.  3. Anorexia, weight loss: Appears to be related to his recent hospitalization and bacteremia. I doubt his symptoms are related to a lymphoproliferative disorder that causing his lymphadenopathy. There is certainly a possibility if his symptoms do not improve in the immediate future.  4. Follow-up: Will be in 6 months to follow his progress and repeat imaging studies.

## 2017-01-02 NOTE — Telephone Encounter (Signed)
Please advise  Referral place for more evaluation on un explain weight loss.

## 2017-01-02 NOTE — Telephone Encounter (Signed)
Left vm for pt to call back, need to inform Charlotte's massage below.

## 2017-01-02 NOTE — Telephone Encounter (Signed)
Patients wife called and said yesterday when they went to GI the patient's BP was running low. By the time they got home it was back to normal. They want to know if he can come off the lasix all together. I asked them to keep track of his BP today for when they get a call back.  They also are unsure why he is being referred to a Kidney doctor.   Please follow up with patient. Thank you.

## 2017-01-02 NOTE — Telephone Encounter (Signed)
Stop aldactone and get an BMET in 1 week

## 2017-01-02 NOTE — Telephone Encounter (Signed)
Instructed DPR to STOP ALDACTONE. HTN Clinic and BMET scheduled next Thursday. DPR agrees with treatment plan.

## 2017-01-02 NOTE — Telephone Encounter (Signed)
Patient &wife called back, Informed of the notes, understood.

## 2017-01-02 NOTE — Telephone Encounter (Signed)
Patient's wife reports they went to the GI doctor yesterday and when they checked his BP, systolic was in the 04B. They checked it several times and it did not go up until they gave him a glass of water. It increased to 82/68. He was completely asymptomatic and "felt fine." When they returned home after the GI visit yesterday, they checked BP with their home cuff and the reading was 95/58 (which she states is normal for him). His BP this morning is 99/64, but he has not taken any of his medications yet. She reports his weight is stable and he has no swelling. "His legs are the skinniest they've ever been." Patient's wife reports she is nervous to continue Lasix given low blood pressure yesterday and will hold the medication until Dr. Radford Pax gives recommendations.

## 2017-01-02 NOTE — Telephone Encounter (Signed)
Yes stop lasix and contiue to monitor BP twice a day and record. Go to hospital if BP drops that low again.  Referral to nephrology due to protein in urine and low albumin.

## 2017-01-07 ENCOUNTER — Encounter: Payer: Self-pay | Admitting: Internal Medicine

## 2017-01-08 DIAGNOSIS — I110 Hypertensive heart disease with heart failure: Secondary | ICD-10-CM | POA: Diagnosis not present

## 2017-01-08 DIAGNOSIS — C61 Malignant neoplasm of prostate: Secondary | ICD-10-CM | POA: Diagnosis not present

## 2017-01-08 DIAGNOSIS — B953 Streptococcus pneumoniae as the cause of diseases classified elsewhere: Secondary | ICD-10-CM | POA: Diagnosis not present

## 2017-01-08 DIAGNOSIS — N403 Nodular prostate with lower urinary tract symptoms: Secondary | ICD-10-CM | POA: Diagnosis not present

## 2017-01-08 DIAGNOSIS — R3912 Poor urinary stream: Secondary | ICD-10-CM | POA: Diagnosis not present

## 2017-01-08 DIAGNOSIS — M1990 Unspecified osteoarthritis, unspecified site: Secondary | ICD-10-CM | POA: Diagnosis not present

## 2017-01-08 DIAGNOSIS — D630 Anemia in neoplastic disease: Secondary | ICD-10-CM | POA: Diagnosis not present

## 2017-01-08 DIAGNOSIS — Z87891 Personal history of nicotine dependence: Secondary | ICD-10-CM | POA: Diagnosis not present

## 2017-01-08 DIAGNOSIS — J449 Chronic obstructive pulmonary disease, unspecified: Secondary | ICD-10-CM | POA: Diagnosis not present

## 2017-01-08 DIAGNOSIS — I272 Pulmonary hypertension, unspecified: Secondary | ICD-10-CM | POA: Diagnosis not present

## 2017-01-08 DIAGNOSIS — I5032 Chronic diastolic (congestive) heart failure: Secondary | ICD-10-CM | POA: Diagnosis not present

## 2017-01-08 DIAGNOSIS — Z9981 Dependence on supplemental oxygen: Secondary | ICD-10-CM | POA: Diagnosis not present

## 2017-01-08 DIAGNOSIS — I480 Paroxysmal atrial fibrillation: Secondary | ICD-10-CM | POA: Diagnosis not present

## 2017-01-08 DIAGNOSIS — R7881 Bacteremia: Secondary | ICD-10-CM | POA: Diagnosis not present

## 2017-01-09 ENCOUNTER — Ambulatory Visit: Payer: Medicare Other | Admitting: Pharmacist

## 2017-01-09 ENCOUNTER — Other Ambulatory Visit: Payer: Medicare Other | Admitting: *Deleted

## 2017-01-09 DIAGNOSIS — J449 Chronic obstructive pulmonary disease, unspecified: Secondary | ICD-10-CM | POA: Diagnosis not present

## 2017-01-09 DIAGNOSIS — I5033 Acute on chronic diastolic (congestive) heart failure: Secondary | ICD-10-CM

## 2017-01-09 DIAGNOSIS — R0902 Hypoxemia: Secondary | ICD-10-CM | POA: Diagnosis not present

## 2017-01-09 NOTE — Progress Notes (Deleted)
Patient ID: [name]                 DOB: [birth year]                      MRN: [id number]     HPI: [name] is a 71 y.o. male referred by Dr. Radford Pax to HTN clinic. 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 Pt called clinic 1 week ago with reports of low BP. He went to the GI doctor where his systolic BP was in the 47S, checked multiple times. He was given a glass of water which increased BP to 82/68. Normal BP at home ranges 90s/50-60s. Pt was advised to stop his spironolactone and presents today for BP check and BMET.  Pt called clinic 1 week ago with complaints of low BP readings  Current HTN meds: furosemide 48m every other day Previously tried: spironolactone 12.545mdaily - d/c'ed 1 week ago due to hypotension BP goal: <130/8061m  Family History: The patient's family history includes Alcohol abuse in his father; Alcoholism in his father; CAD in his father; Cancer in his mother; Ovarian cancer in his sister.   Social History: Former smoker 1 PPD for 62 years, quit in January 2018. Denies alcohol and illicit drug use.  Diet: Does enjoy salt but overall has a limited appetite.  Exercise: Minimal, has metastatic prostate cancer and is on home O2  Home BP readings:   Wt Readings from Last 3 Encounters:  01/02/17 139 lb 11.2 oz (63.4 kg)  01/01/17 139 lb (63 kg)  12/30/16 139 lb (63 kg)   BP Readings from Last 3 Encounters:  01/02/17 (!) 88/61  01/01/17 (!) 66/42  12/30/16 (!) 80/60   Pulse Readings from Last 3 Encounters:  01/02/17 90  01/01/17 80  12/30/16 87    Renal function: Estimated Creatinine Clearance: 73.7 mL/min (by C-G formula based on SCr of 0.65 mg/dL).  Past Medical History:  Diagnosis Date  . Anxiety   . Arthritis    "neck" (08/15/2015)  . Chronic [place]itis ([place] . Chronic diastolic CHF (congestive heart  failure) ([facility] . Constipation   . COPD (ch[place] obstructive pulmonary disease) ([place] . DDD (degenerative disc disease), cervical   . Depression   . Dilated [place] root ([place]  30m92m echo 05/2016  . Dysrhythmia    A-Fib  cardioverted on 10-11-2016  . Edema of left lower extremity   . Facial basal cell cancer   . H/O acne vulgaris 1960s   [facility]  . Headache    history of - left temporal- years ago- not current (08/15/2015)  . Persistent atrial fibrillation (HCC)    on coumadin with CHADS2VASC score of 2  . Pneumonia 1960s; 2015 X 2  . Prostate cancer (HCC)[place] early 2000s   "low spreading; non aggressive type" (08/15/2015)  . Pulmonary HTN ([facility])[place] PASP 43mm21my echo 05/2016  . Skin cancer    "back"    Current Outpatient Prescriptions on File Prior to Visit  Medication Sig Dispense Refill  . acetaminophen (TYLENOL) 325 MG tablet Take 2 tablets (650 mg total) by mouth every 6 (six) hours as needed for mild pain or fever.    . Ascorbic Acid (VITAMIN C)  1000 MG tablet Take 1,000 mg by mouth daily.     . cephALEXin (KEFLEX) 500 MG capsule Take 1 capsule (500 mg total) by mouth 3 (three) times daily. 21 capsule 0  . DULoxetine (CYMBALTA) 30 MG capsule Take 1 capsule (30 mg total) by mouth daily. 8 capsule 0  . furosemide (LASIX) 40 MG tablet Take 0.5 tablets (20 mg total) by mouth every other day. 30 tablet   . lactulose (CHRONULAC) 10 GM/15ML solution Take 30 mLs (20 g total) by mouth 2 (two) times daily as needed for mild constipation. 240 mL 0  . mirtazapine (REMERON) 15 MG tablet Take 1 tablet (15 mg total) by mouth at bedtime. 30 tablet 3  . polyethylene glycol (MIRALAX / GLYCOLAX) packet Take 17 g by mouth daily as needed. 14 each 0  . rivaroxaban (XARELTO) 20 MG TABS tablet Take 1 tablet (20 mg total) by mouth daily with supper. 30 tablet 5  . vitamin B-12 (CYANOCOBALAMIN) 1000 MCG tablet Take 1,000 mcg by mouth daily.     No  current facility-administered medications on file prior to visit.     Allergies  Allergen Reactions  . Demerol [Meperidine] Other (See Comments)    UNSPECIFIED REACTION  Causes system to shutdown. ?   . Oxycodone Hcl Other (See Comments)    Pt states this medication 'wires him up' and makes pt hyper; pt does not want to take this ever again     Assessment/Plan:

## 2017-01-10 ENCOUNTER — Telehealth: Payer: Self-pay | Admitting: Nurse Practitioner

## 2017-01-10 ENCOUNTER — Telehealth: Payer: Self-pay | Admitting: Internal Medicine

## 2017-01-10 ENCOUNTER — Telehealth: Payer: Self-pay | Admitting: Cardiology

## 2017-01-10 DIAGNOSIS — R7881 Bacteremia: Secondary | ICD-10-CM | POA: Diagnosis not present

## 2017-01-10 DIAGNOSIS — I110 Hypertensive heart disease with heart failure: Secondary | ICD-10-CM | POA: Diagnosis not present

## 2017-01-10 DIAGNOSIS — J449 Chronic obstructive pulmonary disease, unspecified: Secondary | ICD-10-CM | POA: Diagnosis not present

## 2017-01-10 DIAGNOSIS — Z9981 Dependence on supplemental oxygen: Secondary | ICD-10-CM | POA: Diagnosis not present

## 2017-01-10 DIAGNOSIS — I5032 Chronic diastolic (congestive) heart failure: Secondary | ICD-10-CM | POA: Diagnosis not present

## 2017-01-10 DIAGNOSIS — C61 Malignant neoplasm of prostate: Secondary | ICD-10-CM | POA: Diagnosis not present

## 2017-01-10 DIAGNOSIS — M1990 Unspecified osteoarthritis, unspecified site: Secondary | ICD-10-CM | POA: Diagnosis not present

## 2017-01-10 DIAGNOSIS — I272 Pulmonary hypertension, unspecified: Secondary | ICD-10-CM | POA: Diagnosis not present

## 2017-01-10 DIAGNOSIS — D630 Anemia in neoplastic disease: Secondary | ICD-10-CM | POA: Diagnosis not present

## 2017-01-10 DIAGNOSIS — Z87891 Personal history of nicotine dependence: Secondary | ICD-10-CM | POA: Diagnosis not present

## 2017-01-10 DIAGNOSIS — B953 Streptococcus pneumoniae as the cause of diseases classified elsewhere: Secondary | ICD-10-CM | POA: Diagnosis not present

## 2017-01-10 DIAGNOSIS — I480 Paroxysmal atrial fibrillation: Secondary | ICD-10-CM | POA: Diagnosis not present

## 2017-01-10 LAB — BASIC METABOLIC PANEL
BUN/Creatinine Ratio: 28 — ABNORMAL HIGH (ref 10–24)
BUN: 26 mg/dL (ref 8–27)
CALCIUM: 8.3 mg/dL — AB (ref 8.6–10.2)
CO2: 24 mmol/L (ref 20–29)
Chloride: 100 mmol/L (ref 96–106)
Creatinine, Ser: 0.92 mg/dL (ref 0.76–1.27)
GFR, EST AFRICAN AMERICAN: 95 mL/min/{1.73_m2} (ref >59)
GFR, EST NON AFRICAN AMERICAN: 82 mL/min/{1.73_m2} (ref >59)
Glucose: 74 mg/dL (ref 65–99)
POTASSIUM: 4.7 mmol/L (ref 3.5–5.2)
Sodium: 139 mmol/L (ref 134–144)

## 2017-01-10 NOTE — Telephone Encounter (Signed)
I think this came in and was sent to scan, but my understanding is that scanning is 2 weeks behind. Can you try getting another report from the DME company for [place] to work with please.

## 2017-01-10 NOTE — Telephone Encounter (Signed)
Started nursing for once a week for 4 weeks for general assessment.

## 2017-01-10 NOTE — Telephone Encounter (Signed)
CY please advise if you have seen the results of the ONO that was done for the pt by [facility]  Thanks

## 2017-01-10 NOTE — Telephone Encounter (Signed)
Informed patient's wife that Dr. Radford Pax has not sent results. Informed her they will be called with results once Dr. Radford Pax sends notes to Nursing. She was grateful for call.

## 2017-01-10 NOTE — Telephone Encounter (Signed)
Follow Up   Calling to follow upon lab results. Please call.

## 2017-01-10 NOTE — Telephone Encounter (Signed)
Called [facility] and was on hold for >5 mins. Will need to call back.

## 2017-01-10 NOTE — Telephone Encounter (Signed)
ok 

## 2017-01-14 ENCOUNTER — Other Ambulatory Visit: Payer: Self-pay | Admitting: *Deleted

## 2017-01-14 DIAGNOSIS — Z87891 Personal history of nicotine dependence: Secondary | ICD-10-CM | POA: Diagnosis not present

## 2017-01-14 DIAGNOSIS — R600 Localized edema: Secondary | ICD-10-CM

## 2017-01-14 DIAGNOSIS — J449 Chronic obstructive pulmonary disease, unspecified: Secondary | ICD-10-CM | POA: Diagnosis not present

## 2017-01-14 DIAGNOSIS — R7881 Bacteremia: Secondary | ICD-10-CM | POA: Diagnosis not present

## 2017-01-14 DIAGNOSIS — M1990 Unspecified osteoarthritis, unspecified site: Secondary | ICD-10-CM | POA: Diagnosis not present

## 2017-01-14 DIAGNOSIS — C61 Malignant neoplasm of prostate: Secondary | ICD-10-CM | POA: Diagnosis not present

## 2017-01-14 DIAGNOSIS — I110 Hypertensive heart disease with heart failure: Secondary | ICD-10-CM | POA: Diagnosis not present

## 2017-01-14 DIAGNOSIS — I480 Paroxysmal atrial fibrillation: Secondary | ICD-10-CM | POA: Diagnosis not present

## 2017-01-14 DIAGNOSIS — I272 Pulmonary hypertension, unspecified: Secondary | ICD-10-CM | POA: Diagnosis not present

## 2017-01-14 DIAGNOSIS — Z9981 Dependence on supplemental oxygen: Secondary | ICD-10-CM | POA: Diagnosis not present

## 2017-01-14 DIAGNOSIS — I5032 Chronic diastolic (congestive) heart failure: Secondary | ICD-10-CM | POA: Diagnosis not present

## 2017-01-14 DIAGNOSIS — D630 Anemia in neoplastic disease: Secondary | ICD-10-CM | POA: Diagnosis not present

## 2017-01-14 DIAGNOSIS — B953 Streptococcus pneumoniae as the cause of diseases classified elsewhere: Secondary | ICD-10-CM | POA: Diagnosis not present

## 2017-01-14 MED ORDER — FUROSEMIDE 40 MG PO TABS: 20.0000 mg | ORAL_TABLET | ORAL | 6 refills | Status: DC

## 2017-01-14 MED ORDER — RIVAROXABAN 20 MG PO TABS: 20.0000 mg | ORAL_TABLET | Freq: Every day | ORAL | 6 refills | Status: DC

## 2017-01-14 NOTE — Telephone Encounter (Signed)
Attempted to call [facility] to get the ONO faxed over to [place].  Will call back once respiratory gets in.

## 2017-01-14 NOTE — Telephone Encounter (Signed)
Called [name] no answer LMOM w/[name] response...[name]

## 2017-01-14 NOTE — Telephone Encounter (Signed)
Patient is calling to follow up on results.

## 2017-01-14 NOTE — Telephone Encounter (Signed)
LMTCB-Pt needs to continue wearing 2l during sleep but can be off while awake per CY and most recent ONO results.

## 2017-01-15 ENCOUNTER — Telehealth: Payer: Self-pay | Admitting: Internal Medicine

## 2017-01-15 DIAGNOSIS — J9601 Acute respiratory failure with hypoxia: Secondary | ICD-10-CM

## 2017-01-15 NOTE — Telephone Encounter (Signed)
The lowest his oxygen saturation got was 59%. He spent almost an hour and a half with saturation at 88% or less, which is in the range that is considered unsafe, and Medicare covers the oxygen with these criteria because of the health risk.

## 2017-01-15 NOTE — Telephone Encounter (Signed)
Spoke with pt's wife, [name]. She was given the pt's ONO results >> Pt needs to continue wearing 2l during sleep but can be off while awake per CY and most recent ONO results.  There were a lot of questions. When can he be retested? What was the lowest his oxygen fell?  CY - please advise. Thanks.

## 2017-01-15 NOTE — Telephone Encounter (Signed)
ono ordered.  Pt aware.  Nothing further needed.

## 2017-01-15 NOTE — Telephone Encounter (Signed)
Pt's wife is returning call. tr

## 2017-01-15 NOTE — Telephone Encounter (Signed)
Spoke with the pt and his spouse and explained the results  They verbalized understanding He states he wants off of o2 asap  I advised that he should just keep his planned f/u in Nov to discuss this more, and possibly repeat the ONO again then  He wants it done much sooner though "as soon as I can have another one done"  Please advise if okay to order, thanks

## 2017-01-15 NOTE — Telephone Encounter (Signed)
LMTCB

## 2017-01-15 NOTE — Telephone Encounter (Signed)
Spoke with pt's wife, [name]. She is aware of pt's ONO results. Nothing further was needed.

## 2017-01-15 NOTE — Telephone Encounter (Signed)
Ok to order ONOX on room air   Dx chronic hypoxic respiratory failure

## 2017-01-15 NOTE — Telephone Encounter (Signed)
Patient's wife [name] is returning phone call [phone]

## 2017-01-25 DIAGNOSIS — J449 Chronic obstructive pulmonary disease, unspecified: Secondary | ICD-10-CM | POA: Diagnosis not present

## 2017-01-26 DIAGNOSIS — I480 Paroxysmal atrial fibrillation: Secondary | ICD-10-CM | POA: Diagnosis not present

## 2017-01-26 DIAGNOSIS — J449 Chronic obstructive pulmonary disease, unspecified: Secondary | ICD-10-CM | POA: Diagnosis not present

## 2017-01-26 DIAGNOSIS — D630 Anemia in neoplastic disease: Secondary | ICD-10-CM | POA: Diagnosis not present

## 2017-01-26 DIAGNOSIS — C61 Malignant neoplasm of prostate: Secondary | ICD-10-CM | POA: Diagnosis not present

## 2017-01-26 DIAGNOSIS — I5032 Chronic diastolic (congestive) heart failure: Secondary | ICD-10-CM | POA: Diagnosis not present

## 2017-01-26 DIAGNOSIS — B953 Streptococcus pneumoniae as the cause of diseases classified elsewhere: Secondary | ICD-10-CM | POA: Diagnosis not present

## 2017-01-26 DIAGNOSIS — I110 Hypertensive heart disease with heart failure: Secondary | ICD-10-CM | POA: Diagnosis not present

## 2017-01-26 DIAGNOSIS — M1990 Unspecified osteoarthritis, unspecified site: Secondary | ICD-10-CM | POA: Diagnosis not present

## 2017-01-26 DIAGNOSIS — I272 Pulmonary hypertension, unspecified: Secondary | ICD-10-CM | POA: Diagnosis not present

## 2017-01-26 DIAGNOSIS — R7881 Bacteremia: Secondary | ICD-10-CM | POA: Diagnosis not present

## 2017-01-26 DIAGNOSIS — Z87891 Personal history of nicotine dependence: Secondary | ICD-10-CM | POA: Diagnosis not present

## 2017-01-26 DIAGNOSIS — Z9981 Dependence on supplemental oxygen: Secondary | ICD-10-CM | POA: Diagnosis not present

## 2017-01-28 ENCOUNTER — Telehealth: Payer: Self-pay | Admitting: Nurse Practitioner

## 2017-01-28 NOTE — Telephone Encounter (Signed)
Called pt wife she stated that husband ([name]) forearm and fingers was swollen, but since they elevated his arm the swelling has went down a little, denies any pain or bruises, Wife states she wanted to ask if pt can take additional lasix since his arm is still swollen. She states he is currently taking lasix 40 mg a day...[name]

## 2017-01-28 NOTE — Telephone Encounter (Signed)
Do not change lasix dose at this time. Continue with arm elevation as much as possible. If no improvement in next 2days, he needs to be examined in office

## 2017-01-28 NOTE — Telephone Encounter (Signed)
Pt wife called in and said that forearm and hands are swollen, has some question about this meds that he is on and if they could be causing the swelling   Best number [phone]

## 2017-01-29 ENCOUNTER — Other Ambulatory Visit: Payer: Self-pay | Admitting: *Deleted

## 2017-01-29 ENCOUNTER — Encounter: Payer: Self-pay | Admitting: *Deleted

## 2017-01-29 NOTE — Patient Outreach (Signed)
[facility]) Care Management  01/29/2017  JAREE TRINKA July 19, 1945 [id number]   RN spoke with pt today and confirmed identifiers. RN reintroduced [facility] and the purpose for today's call. Pt expressed how he was doing and denies any HF symptoms remaining in the GREEN zone. Although pt admits not weighing daily he states he has been "losing weight". RN stress the importance of ongoing daily weights to monitor possible fluid retention as pt verbalized an understanding. RN also verified pt continues to received assistance from [place] for his home O2 that he reports wearing only at night with no reported problems. Pt states his is having some financial difficulty due to all the required co-pays. RN encouraged pt ot contact the agencies any request possible indigent or financial assistance if available. Also encouraged pt to inquire with his providers on possible payment arrangements so that he may attend all providers visits as recommended. RN offered any other needed resources however pt declined at this time with no additional needs.RN verified pt has been attending all his scheduled medical appointments and has enough refills on all his medications with no discrepancies.  Based upon pt's progress concerning managing his HF RN has informed pt that he will be discharged from the [facility] program with his goals met and plan of care discussed with pt's understanding. RN has informed pt of [facility] information if resources are needed in the future. Also informed pt that his primary provider will be updated on pt's disposition with [facility] at this time graduating fro the [facility] program and services with all goals met. Pt grateful for the ongoing contacts as the call ended.  [name], RN Care Management Coordinator [place] Office [phone]

## 2017-01-29 NOTE — Telephone Encounter (Signed)
Notified pt wife w/Charlotte response..../lm,b

## 2017-02-05 ENCOUNTER — Telehealth: Payer: Self-pay | Admitting: Nurse Practitioner

## 2017-02-05 ENCOUNTER — Encounter: Payer: Medicare Other | Admitting: Cardiothoracic Surgery

## 2017-02-05 ENCOUNTER — Telehealth: Payer: Self-pay | Admitting: Cardiology

## 2017-02-05 DIAGNOSIS — R0902 Hypoxemia: Secondary | ICD-10-CM | POA: Diagnosis not present

## 2017-02-05 DIAGNOSIS — J449 Chronic obstructive pulmonary disease, unspecified: Secondary | ICD-10-CM | POA: Diagnosis not present

## 2017-02-05 NOTE — Telephone Encounter (Signed)
I spoke with pt's wife. She reports pt has swelling in left forearm.  Has been going on for about a week. It is not painful.  Wife thinks pt may sleep on it at night. Improves some with elevation during the day.  Pt is seeing primary care for this tomorrow and I instructed wife to keep this appointment.

## 2017-02-05 NOTE — Telephone Encounter (Signed)
Pt wife called and made an appointment for pt for tomorrow, she would like to know what they can do in the mean time for the swelling in his arm and hand, please call back in regard

## 2017-02-05 NOTE — Telephone Encounter (Signed)
I cannot provide any advice without additional information as I am unsure of how the swelling started or if he had a fall, etc.  I would recommend being seen at an Urgent Care if there is a concern.

## 2017-02-05 NOTE — Telephone Encounter (Signed)
I have advised janet that patient can elevate arm to help reduce swelling---patient has not fallen, however I hesitate with adding any further lasix or other diuretic due to albumin abnormal on lab/kidney function abnormality---advised janet that if she thinks its too bad to wait until tomorrow (has already scheduled appt with charlotte tomorrow), to take him to urgent care or ED, otherwise charlotte can evaluate tomorrow--forwarding to charlotte, fyi.[name]

## 2017-02-05 NOTE — Telephone Encounter (Signed)
New message     Pt c/o swelling: STAT is pt has developed SOB within 24 hours  1. How long have you been experiencing swelling? Week ago   2. Where is the swelling located? Left forearm and hand   3.  Are you currently taking a "fluid pill"?  yes  4.  Are you currently SOB?  no  5.  Have you traveled recently? No

## 2017-02-05 NOTE — Telephone Encounter (Signed)
Routing to greg, can you please advise in the absence of charlotte---I will call patient back, thanks

## 2017-02-06 ENCOUNTER — Encounter: Payer: Self-pay | Admitting: Nurse Practitioner

## 2017-02-06 ENCOUNTER — Ambulatory Visit (INDEPENDENT_AMBULATORY_CARE_PROVIDER_SITE_OTHER): Payer: Medicare Other | Admitting: Nurse Practitioner

## 2017-02-06 VITALS — BP 96/60 | HR 74 | Temp 97.5°F | Ht 71.0 in | Wt 153.0 lb

## 2017-02-06 DIAGNOSIS — M7989 Other specified soft tissue disorders: Secondary | ICD-10-CM

## 2017-02-06 DIAGNOSIS — R590 Localized enlarged lymph nodes: Secondary | ICD-10-CM

## 2017-02-06 DIAGNOSIS — Z23 Encounter for immunization: Secondary | ICD-10-CM | POA: Diagnosis not present

## 2017-02-06 NOTE — Progress Notes (Signed)
Subjective:  Patient ID: [name], male    DOB: May 22, 1945  Age: 71 y.o. MRN: [id number]  CC: Edema (left arms and hand swelling, going on for 3 week, tight, flu shot?)  Other  This is a recurrent (left Forearm swelling) problem. The current episode started more than 1 month ago. The problem occurs intermittently. The problem has been waxing and waning. Associated symptoms include arthralgias. Pertinent negatives include no chest pain, chills, congestion, coughing, diaphoresis, fever, joint swelling, myalgias, neck pain, numbness, rash or weakness. Nothing aggravates the symptoms. Treatments tried: elevation. The treatment provided moderate relief.  denies any injury. No paresthesia, no weakness. Reports onset after left cervical lymph node biopsy by Dr. Darcey Nora (10/22/2016). Pathology was benign. Recent CXR 12/11/2016: stable. CT Chest done 11/18/2016: persistent mediastinal and bilateral axillary adenopathy (mild increase). Axumin PET scan was done on 12/12/2016 showed no clear evidence of prostate cancer known metastasis. No significant radiotracer accumulation within the subcarinal lymph node or the periaortic lymph nodes. Recently evaluated by urology 11/2016 and oncology 12/2016 Differental diagnosis per Oncology: low grade lymphoproliferative disorder or follicular lymphoma or sarcoidosis or fungal infection. He is to f/up  With oncology in 4-48month for repeat impaging  Outpatient Medications Prior to Visit  Medication Sig Dispense Refill  . acetaminophen (TYLENOL) 325 MG tablet Take 2 tablets (650 mg total) by mouth every 6 (six) hours as needed for mild pain or fever.    . Ascorbic Acid (VITAMIN C) 1000 MG tablet Take 1,000 mg by mouth daily.     . cephALEXin (KEFLEX) 500 MG capsule Take 1 capsule (500 mg total) by mouth 3 (three) times daily. 21 capsule 0  . DULoxetine (CYMBALTA) 30 MG capsule Take 1 capsule (30 mg total) by mouth daily. 8 capsule 0  . furosemide (LASIX)  40 MG tablet Take 0.5 tablets (20 mg total) by mouth every other day. 30 tablet 6  . lactulose (CHRONULAC) 10 GM/15ML solution Take 30 mLs (20 g total) by mouth 2 (two) times daily as needed for mild constipation. 240 mL 0  . mirtazapine (REMERON) 15 MG tablet Take 1 tablet (15 mg total) by mouth at bedtime. 30 tablet 3  . polyethylene glycol (MIRALAX / GLYCOLAX) packet Take 17 g by mouth daily as needed. 14 each 0  . rivaroxaban (XARELTO) 20 MG TABS tablet Take 1 tablet (20 mg total) by mouth daily with supper. 30 tablet 6  . vitamin B-12 (CYANOCOBALAMIN) 1000 MCG tablet Take 1,000 mcg by mouth daily.     No facility-administered medications prior to visit.     ROS See HPI  Objective:  BP 96/60   Pulse 74   Temp (!) 97.5 F (36.4 C)   Ht _0  (1.803 m)   Wt 153 lb (69.4 kg)   SpO2 98%   BMI 21.34 kg/m   BP Readings from Last 3 Encounters:  02/06/17 96/60  01/02/17 (!) 88/61  01/01/17 (!) 66/42    Wt Readings from Last 3 Encounters:  02/06/17 153 lb (69.4 kg)  01/02/17 139 lb 11.2 oz (63.4 kg)  01/01/17 139 lb (63 kg)    Physical Exam  Constitutional: He is oriented to person, place, and time.  Neck: Normal range of motion. Neck supple. No tracheal deviation present. No thyromegaly present.  Cardiovascular: Normal rate.   Pulmonary/Chest: Effort normal. No stridor.  Musculoskeletal: He exhibits edema. He exhibits no tenderness or deformity.       Left shoulder: Normal.  Left elbow: Normal.       Left wrist: Normal.       Cervical back: Normal.       Left upper arm: Normal.       Left forearm: He exhibits swelling and edema. He exhibits no tenderness, no bony tenderness, no deformity and no laceration.       Left hand: Normal.  No axillary or subclavicular lymphadenopathy.  Lymphadenopathy:    He has no cervical adenopathy.  Neurological: He is alert and oriented to person, place, and time.  Skin: Skin is warm and dry. No erythema.  Vitals  reviewed.   Lab Results  Component Value Date   WBC 8.6 12/02/2016   HGB 12.3 (L) 12/02/2016   HCT 38.4 (L) 12/02/2016   PLT 493.0 (H) 12/02/2016   GLUCOSE 74 01/09/2017   ALT 14 12/23/2016   AST 17 12/23/2016   NA 139 01/09/2017   K 4.7 01/09/2017   CL 100 01/09/2017   CREATININE 0.92 01/09/2017   BUN 26 01/09/2017   CO2 24 01/09/2017   TSH 1.662 11/18/2016   PSA 1.7 11/01/2016   INR 3.68 11/18/2016    Ct Abdomen Pelvis W Contrast  Result Date: 12/26/2016 CLINICAL DATA:  30 pound weight loss. Weakness. Decreased appetite. Low albumin. Prostate cancer with elevated PSA. EXAM: CT ABDOMEN AND PELVIS WITH CONTRAST TECHNIQUE: Multidetector CT imaging of the abdomen and pelvis was performed using the standard protocol following bolus administration of intravenous contrast. CONTRAST:  170m ISOVUE-300 IOPAMIDOL (ISOVUE-300) INJECTION 61% COMPARISON:  12/12/2016 F18 PET.  FDG PET of 10/31/2016. FINDINGS: Lower chest: Right base atelectasis. Small bilateral pleural effusions persist. Mild cardiomegaly. Elevated right heart pressure suspected, as evidenced by contrast reflux into the IVC and hepatic veins. Hepatobiliary: Heterogeneous hepatic enhancement, likely related to elevated right heart pressures. Right hepatic lobe 2.2 cm cyst inferiorly. Normal gallbladder, without biliary ductal dilatation. Pancreas: Normal, without mass or ductal dilatation. Spleen: Old granulomatous disease in the spleen. Adrenals/Urinary Tract: Normal adrenal glands. Well-circumscribed hypoattenuating right renal lesions are most consistent with cysts. Left sided renal sinus cysts. No hydronephrosis. Normal urinary bladder. Stomach/Bowel: Normal stomach, without wall thickening. Colonic stool burden suggests constipation. Normal terminal ileum. Normal small bowel. Vascular/Lymphatic: Advanced aortic and branch vessel atherosclerosis. Retroperitoneal adenopathy. An index aortocaval node measures 1.7 cm on image 26/series  2 versus 1.4 cm on 12/12/2016. Index node adjacent to the aortic bifurcation measures 1.7 x 1.7 cm on image 41/series 2 versus 2.1 x 1.5 cm on the prior. Right external iliac 1.5 cm node on image 56/series 2 is similar (when remeasured). Reproductive: Moderate prostatomegaly. Other: No significant free fluid. Anasarca. Nonspecific soft tissue thickening about the umbilicus on image 327/OZDGUY2. Musculoskeletal: Degenerative partial fusion of the bilateral sacroiliac joints. Convex left lumbar spine curvature. Lumbosacral spondylosis. IMPRESSION: 1. Re- demonstration of abdominopelvic adenopathy. Similar to slightly increased compared to 12/12/2016. Differential considerations remain lymphoproliferative process or metastatic prostate cancer. 2.  Possible constipation. 3. Similar small bilateral pleural effusions. Suspect elevated right heart pressures. 4. Aortic Atherosclerosis (ICD10-I70.0). Electronically Signed   By: KAbigail MiyamotoM.D.   On: 12/26/2016 16:21    Assessment & Plan:   WAshrafwas seen today for edema.  Diagnoses and all orders for this visit:  Swelling of arm -     Cancel: VAS [place]OWER EXTREMITY VENOUS (DVT); Future -     Cancel: [place]Venous Img Upper Uni Left; Future -     VAS [place]PPER EXTREMITY VENOUS DUPLEX;  Future  Need for influenza vaccination -     Flu vaccine HIGH DOSE PF  Mediastinal lymphadenopathy   I am having Mr. Boquet maintain his vitamin C, acetaminophen, vitamin B-12, polyethylene glycol, lactulose, DULoxetine, cephALEXin, mirtazapine, furosemide, and rivaroxaban.  No orders of the defined types were placed in this encounter.   Follow-up: Return if symptoms worsen or fail to improve.  Wilfred Lacy, NP

## 2017-02-06 NOTE — Patient Instructions (Addendum)
You will be contacted with appt for left arm [place]. Low suspicion for DVT due to current xarelto use.  Forearm swelling possibly related to lymphadema. Advised patient to use compression sleeve daily (off at night).  Consider vascular referral if venous doppler is negative.

## 2017-02-07 ENCOUNTER — Telehealth: Payer: Self-pay | Admitting: Nurse Practitioner

## 2017-02-07 ENCOUNTER — Telehealth: Payer: Self-pay | Admitting: Cardiology

## 2017-02-07 NOTE — Telephone Encounter (Signed)
Spoke with patients wife and she reports that the patient has been taking one whole pill of the furosemide qd. She is questioning how the patient should actually be taking the medication. The last refill from our office was sent in for one-half tablet qod. Patients pcp has been involved with dosing as well but when the patient requested a refill from their office at his visit yesterday, they were instructed to call our office. Please advise on what patients current dose should be. Thanks, MI

## 2017-02-07 NOTE — Telephone Encounter (Signed)
New Message   They needs a new prescription sent pt was taking more then he was suppose to and now he is out    *STAT* If patient is at the pharmacy, call can be transferred to refill team.   1. Which medications need to be refilled? (please list name of each medication and dose if known)  Lasix   2. Which pharmacy/location (including street and city if local pharmacy) is medication to be sent to? [facility] on [address]   3. Do they need a 30 day or 90 day supply?  [place]

## 2017-02-07 NOTE — Assessment & Plan Note (Signed)
Consider referral to oncology to further investigation multiple thoracic lymphadenopathy and weight loss.

## 2017-02-07 NOTE — Telephone Encounter (Signed)
Pt wife called and states they have an appointment with the nephrologists, Dr. Justin Mend on October 31st, they would liek to know how they can get an appointment sooner. Please advise and call back

## 2017-02-07 NOTE — Telephone Encounter (Signed)
Spoke with wife at length RE: pt's Furosemide dosage and what he should be on vs what he has been taking.  Advised per Dr Theodosia Blender last orders pt should be taking 40 mg tablets 1/2 tablet every other day.  Wife reports someone had told them at some point to increase to a whole tablet every day.  While I do not doubt this I am unable to locate any documentation r/t an increase in Furosemide.  Per wife, pt is unable to get the medication refilled d/t insurance limitations and they do not have any at time time. Advised they can pay for it cash price and not use the insurance to cover it but did advise to return to RX as written.  Wife is concerned also because she doesn't know who to call when pt has a problem with swelling.  Advised if she feels it is heart related-swelling in feet, ankles, abdomin, weight increase over night etc to call Dr Radford Pax or this office. If it's something like colds, flu, fever etc to call PCP.  Wife is questioning why pt has been referred to Nephrology.  Advised of last lab Crea was NL at 0.92/BUN at 26.  Wife reports she asked today at the PCP office and was told he was being referred because he has protein in his urine.  The last U/A I have been able to locate is in media scanned in from [facility] in 11/2016.  There was protein in his urine at that time however it was after a recent hospitalization for sepsis, pleural effusion, anasarca r/t CHF among many other issues including a large weight loss and malnutirtion.  He was noted to have had hypoalbuminemia at that time felt to be nutritional.  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  Advised wife if pt is being referred to nephrology d/t this u/a completed in 7/18 it may be prudent to recheck a u/a for albumin and protein prior to that appt.  Wife states agreement and is interested in him having a repeat BMP and hepatic function testing as well.  She will ask Monday for a repeat of these.  She  will c/b with further concerns and questions.  Will forward to Dr Radford Pax for her review.

## 2017-02-10 ENCOUNTER — Ambulatory Visit (HOSPITAL_COMMUNITY)
Admission: RE | Admit: 2017-02-10 | Discharge: 2017-02-10 | Disposition: A | Discharge status: Home / Self Care | Payer: Medicare Other | Source: Ambulatory Visit | Attending: Cardiology | Admitting: Cardiology

## 2017-02-10 DIAGNOSIS — M79622 Pain in left upper arm: Secondary | ICD-10-CM | POA: Diagnosis not present

## 2017-02-10 DIAGNOSIS — M7989 Other specified soft tissue disorders: Secondary | ICD-10-CM | POA: Insufficient documentation

## 2017-02-10 DIAGNOSIS — R590 Localized enlarged lymph nodes: Secondary | ICD-10-CM | POA: Insufficient documentation

## 2017-02-10 NOTE — Telephone Encounter (Signed)
Pt had an appt on 9/26 with Dr. Justin Mend and no showed. I called [facility] to see if we can get him a sooner appt. Unfortunately they can not.  Left msg on pt's vm to call back

## 2017-02-10 NOTE — Assessment & Plan Note (Signed)
left cervical lymph node biopsy by Dr. Darcey Nora (10/22/2016). Pathology was benign. Recent CXR 12/11/2016: stable. CT Chest done 11/18/2016: persistent mediastinal and bilateral axillary adenopathy (mild increase). Axumin PET scan was done on 12/12/2016 showed no clear evidence of prostate cancer known metastasis. No significant radiotracer accumulation within the subcarinal lymph node or the periaortic lymph nodes. Recently evaluated by urology 11/2016 and oncology 12/2016 Differental diagnosis per Oncology: low grade lymphoproliferative disorder or follicular lymphoma or sarcoidosis or fungal infection. He is to f/up  With oncology in 4-47month for repeat impaging

## 2017-02-11 NOTE — Telephone Encounter (Signed)
Pt is aware of massage below.

## 2017-02-11 NOTE — Telephone Encounter (Signed)
Per Estella Husk last not he was to be on lasix 62m daily but his PCP may have changed the dose - we did not refer him to nephrology - that must have been his PCP.  He needs to check with them.  Also if he is having SOB he needs worked in with the [facility]

## 2017-02-11 NOTE — Telephone Encounter (Signed)
-----  Message from Flossie Buffy, NP sent at 02/10/2017  9:26 PM EDT ----- Regarding: FW: Upper Extremity Venous Duplex Venous doppler was negative for DVT. He is to use compression sleeve as discussed in office. If no improvement, will refer to vascular.   ----- Message ----- From: Charlaine Dalton, RVT Sent: 02/10/2017   1:15 PM To: Flossie Buffy, NP Subject: Upper Extremity Venous Duplex                  Today's left upper extremity venous duplex is negative for DVT.

## 2017-02-11 NOTE — Telephone Encounter (Signed)
Left pt a message to call back. 

## 2017-02-13 NOTE — Telephone Encounter (Signed)
Left message for patient to call back.

## 2017-02-17 ENCOUNTER — Telehealth: Payer: Self-pay | Admitting: Nurse Practitioner

## 2017-02-17 ENCOUNTER — Telehealth: Payer: Self-pay | Admitting: Cardiology

## 2017-02-17 NOTE — Telephone Encounter (Signed)
See note from 02/17/17

## 2017-02-17 NOTE — Telephone Encounter (Signed)
Patient complaining of gaining over 5 lb in one week after going from Lasix 40 mg daily to Lasix 20 mg every other day. Patient's pulmonology office changed patient's Lasix due to his low BP at his OV on 12/30/16. Patient's wife (DPR) states that patient has a rash on chest and BLE /BUE swelling. Patient's HR 76 BP 103/90. Patient's recent doppler on BUE showed no DVT. Patient has been referred to nephrologist by PCP, but will not be seen until the end of the month. Patient denies any SOB or CP. Consulted DOD, Dr. Rayann Heman. He recommend for Dr. Radford Pax to review patient's message and advise. Called patient back and informed patient's wife that message would be sent to Dr. Radford Pax, but she should call her PCP in the mean time to discuss patient's Lasix.  Patient's wife also wanted to ask Dr. Radford Pax about patient being referred to an EP doctor for patient's A. FIB. Will forward to Dr. Radford Pax for advisement.

## 2017-02-17 NOTE — Telephone Encounter (Signed)
Spouse has called back.  Gave her charlottes response.  Spouse has scheduled appt for patient on 10/09

## 2017-02-17 NOTE — Telephone Encounter (Signed)
Called pt/wife no answer LMOM w/Charlotte response...[name]

## 2017-02-17 NOTE — Telephone Encounter (Signed)
Patient's wife and wanted to know if the patients LASIX could be upped. He is still holding onto fluid. He was seen on 9/28. Please advise. Thank you.

## 2017-02-17 NOTE — Telephone Encounter (Signed)
I will recommended she discuss additional lasix dose with cardiology. I do not think she should increase dose at this time.

## 2017-02-17 NOTE — Telephone Encounter (Signed)
Pt c/o swelling: STAT is pt has developed SOB within 24 hours  1) How much weight have you gained and in what time span? 5bl    Last mon.156.6-161.8 today  2) If swelling, where is the swelling located? ** feet, ankles, hand and arms  3) Are you currently taking a fluid pill? Yes    4) Are you currently SOB? no  5) Do you have a log of your daily weights (if so, list)? YES   MON  156.6 TUE    157.2 SAT    160.8 SUN    162.0  BP 103/90 TODAY 161.8   6) Have you gained 3 pounds in a day or 5 pounds in a week? YES  7) Have you traveled recently? NO    LASIX 1 HALF EVERY OTHRT DAY  Per wife

## 2017-02-18 ENCOUNTER — Ambulatory Visit: Payer: Medicare Other | Admitting: Nurse Practitioner

## 2017-02-18 NOTE — Telephone Encounter (Signed)
Left a message for the pt to call back and ask to speak with a triage Nurse, to endorse Dr. Theodosia Blender recommendation.

## 2017-02-18 NOTE — Telephone Encounter (Signed)
Follow Up:;   Returning your call.

## 2017-02-18 NOTE — Telephone Encounter (Signed)
Spent over 15 mins with the pt and wife going over Dr. Theodosia Blender recommendations.  Per both parties, the pts weight did trend up, and he does have some lower extremity edema.  Informed the both parties that unfortunately, there are no availabilities with a PA the rest of the week.  Did offer the pt an appt to see Dr Radford Pax for this Friday 10/12 at 2 pm.  Advised the pt and wife to arrive 15 mins prior to this appt.  Informed the pt that in the meantime, if his symptoms worsen and his weight continues to trend up and his sob increases, then he should refer to the ER for further eval.  Advised the pt to elevate his head at night and continue using his PRN O2.  Advised the pt to refrain from high humidity outdoors.  Advised the pt to maintain a low sodium diet.  Informed the pt that I will route this message back to Dr Radford Pax to review and advise if she would like to temporarily increase his lasix, until he is able to come in and see her on 10/12.  Informed the pt that if she provides PRN orders until then, a triage Nurse will call him back and endorse this to him.  Both parties verbalized understanding and agree with this plan.

## 2017-02-18 NOTE — Telephone Encounter (Signed)
Patient needs to come in tomorrow to see a PA for evaluation

## 2017-02-21 ENCOUNTER — Encounter: Payer: Self-pay | Admitting: Cardiology

## 2017-02-21 ENCOUNTER — Ambulatory Visit (INDEPENDENT_AMBULATORY_CARE_PROVIDER_SITE_OTHER): Payer: Medicare Other | Admitting: Cardiology

## 2017-02-21 ENCOUNTER — Inpatient Hospital Stay (HOSPITAL_COMMUNITY)
Admission: EM | Admit: 2017-02-21 | Discharge: 2017-03-02 | DRG: 190 | Disposition: A | Discharge status: Home Health | Payer: Medicare Other | Attending: Internal Medicine | Admitting: Internal Medicine

## 2017-02-21 ENCOUNTER — Encounter (HOSPITAL_COMMUNITY): Payer: Self-pay | Admitting: Emergency Medicine

## 2017-02-21 ENCOUNTER — Emergency Department (HOSPITAL_COMMUNITY): Payer: Medicare Other

## 2017-02-21 VITALS — BP 92/60 | HR 79 | Ht 71.0 in | Wt 165.8 lb

## 2017-02-21 DIAGNOSIS — I480 Paroxysmal atrial fibrillation: Secondary | ICD-10-CM | POA: Diagnosis present

## 2017-02-21 DIAGNOSIS — I083 Combined rheumatic disorders of mitral, aortic and tricuspid valves: Secondary | ICD-10-CM | POA: Diagnosis present

## 2017-02-21 DIAGNOSIS — D638 Anemia in other chronic diseases classified elsewhere: Secondary | ICD-10-CM | POA: Diagnosis not present

## 2017-02-21 DIAGNOSIS — I5043 Acute on chronic combined systolic (congestive) and diastolic (congestive) heart failure: Secondary | ICD-10-CM

## 2017-02-21 DIAGNOSIS — J9621 Acute and chronic respiratory failure with hypoxia: Secondary | ICD-10-CM | POA: Diagnosis present

## 2017-02-21 DIAGNOSIS — Z9981 Dependence on supplemental oxygen: Secondary | ICD-10-CM | POA: Diagnosis not present

## 2017-02-21 DIAGNOSIS — E46 Unspecified protein-calorie malnutrition: Secondary | ICD-10-CM | POA: Diagnosis not present

## 2017-02-21 DIAGNOSIS — E8779 Other fluid overload: Secondary | ICD-10-CM | POA: Diagnosis not present

## 2017-02-21 DIAGNOSIS — I5033 Acute on chronic diastolic (congestive) heart failure: Secondary | ICD-10-CM | POA: Diagnosis not present

## 2017-02-21 DIAGNOSIS — I481 Persistent atrial fibrillation: Secondary | ICD-10-CM

## 2017-02-21 DIAGNOSIS — J441 Chronic obstructive pulmonary disease with (acute) exacerbation: Principal | ICD-10-CM | POA: Diagnosis present

## 2017-02-21 DIAGNOSIS — Z87891 Personal history of nicotine dependence: Secondary | ICD-10-CM

## 2017-02-21 DIAGNOSIS — E8809 Other disorders of plasma-protein metabolism, not elsewhere classified: Secondary | ICD-10-CM | POA: Diagnosis present

## 2017-02-21 DIAGNOSIS — Z800 Family history of malignant neoplasm of digestive organs: Secondary | ICD-10-CM

## 2017-02-21 DIAGNOSIS — M7989 Other specified soft tissue disorders: Secondary | ICD-10-CM | POA: Diagnosis present

## 2017-02-21 DIAGNOSIS — I9589 Other hypotension: Secondary | ICD-10-CM | POA: Diagnosis present

## 2017-02-21 DIAGNOSIS — I272 Pulmonary hypertension, unspecified: Secondary | ICD-10-CM

## 2017-02-21 DIAGNOSIS — C771 Secondary and unspecified malignant neoplasm of intrathoracic lymph nodes: Secondary | ICD-10-CM | POA: Diagnosis not present

## 2017-02-21 DIAGNOSIS — Z9889 Other specified postprocedural states: Secondary | ICD-10-CM

## 2017-02-21 DIAGNOSIS — Z720 Tobacco use: Secondary | ICD-10-CM | POA: Diagnosis not present

## 2017-02-21 DIAGNOSIS — R7989 Other specified abnormal findings of blood chemistry: Secondary | ICD-10-CM

## 2017-02-21 DIAGNOSIS — R600 Localized edema: Secondary | ICD-10-CM

## 2017-02-21 DIAGNOSIS — I472 Ventricular tachycardia: Secondary | ICD-10-CM | POA: Diagnosis not present

## 2017-02-21 DIAGNOSIS — I2729 Other secondary pulmonary hypertension: Secondary | ICD-10-CM | POA: Diagnosis present

## 2017-02-21 DIAGNOSIS — I77810 Thoracic aortic ectasia: Secondary | ICD-10-CM | POA: Diagnosis present

## 2017-02-21 DIAGNOSIS — Z79899 Other long term (current) drug therapy: Secondary | ICD-10-CM

## 2017-02-21 DIAGNOSIS — Z6821 Body mass index (BMI) 21.0-21.9, adult: Secondary | ICD-10-CM | POA: Diagnosis not present

## 2017-02-21 DIAGNOSIS — G8929 Other chronic pain: Secondary | ICD-10-CM | POA: Diagnosis not present

## 2017-02-21 DIAGNOSIS — J449 Chronic obstructive pulmonary disease, unspecified: Secondary | ICD-10-CM | POA: Diagnosis not present

## 2017-02-21 DIAGNOSIS — Z885 Allergy status to narcotic agent status: Secondary | ICD-10-CM

## 2017-02-21 DIAGNOSIS — E8581 Light chain (AL) amyloidosis: Secondary | ICD-10-CM | POA: Diagnosis not present

## 2017-02-21 DIAGNOSIS — I950 Idiopathic hypotension: Secondary | ICD-10-CM | POA: Diagnosis not present

## 2017-02-21 DIAGNOSIS — Z8701 Personal history of pneumonia (recurrent): Secondary | ICD-10-CM

## 2017-02-21 DIAGNOSIS — C61 Malignant neoplasm of prostate: Secondary | ICD-10-CM | POA: Diagnosis present

## 2017-02-21 DIAGNOSIS — M1991 Primary osteoarthritis, unspecified site: Secondary | ICD-10-CM | POA: Diagnosis present

## 2017-02-21 DIAGNOSIS — F329 Major depressive disorder, single episode, unspecified: Secondary | ICD-10-CM | POA: Diagnosis present

## 2017-02-21 DIAGNOSIS — R7982 Elevated C-reactive protein (CRP): Secondary | ICD-10-CM | POA: Diagnosis not present

## 2017-02-21 DIAGNOSIS — I4819 Other persistent atrial fibrillation: Secondary | ICD-10-CM

## 2017-02-21 DIAGNOSIS — R0602 Shortness of breath: Secondary | ICD-10-CM | POA: Diagnosis not present

## 2017-02-21 DIAGNOSIS — R809 Proteinuria, unspecified: Secondary | ICD-10-CM | POA: Diagnosis not present

## 2017-02-21 DIAGNOSIS — J9620 Acute and chronic respiratory failure, unspecified whether with hypoxia or hypercapnia: Secondary | ICD-10-CM | POA: Diagnosis present

## 2017-02-21 DIAGNOSIS — I4891 Unspecified atrial fibrillation: Secondary | ICD-10-CM | POA: Diagnosis not present

## 2017-02-21 DIAGNOSIS — I110 Hypertensive heart disease with heart failure: Secondary | ICD-10-CM | POA: Diagnosis present

## 2017-02-21 DIAGNOSIS — E8770 Fluid overload, unspecified: Secondary | ICD-10-CM | POA: Diagnosis present

## 2017-02-21 DIAGNOSIS — Z8249 Family history of ischemic heart disease and other diseases of the circulatory system: Secondary | ICD-10-CM

## 2017-02-21 DIAGNOSIS — I959 Hypotension, unspecified: Secondary | ICD-10-CM | POA: Diagnosis not present

## 2017-02-21 DIAGNOSIS — N049 Nephrotic syndrome with unspecified morphologic changes: Secondary | ICD-10-CM | POA: Diagnosis present

## 2017-02-21 DIAGNOSIS — I361 Nonrheumatic tricuspid (valve) insufficiency: Secondary | ICD-10-CM | POA: Diagnosis not present

## 2017-02-21 DIAGNOSIS — F419 Anxiety disorder, unspecified: Secondary | ICD-10-CM | POA: Diagnosis present

## 2017-02-21 DIAGNOSIS — I340 Nonrheumatic mitral (valve) insufficiency: Secondary | ICD-10-CM | POA: Diagnosis not present

## 2017-02-21 DIAGNOSIS — J90 Pleural effusion, not elsewhere classified: Secondary | ICD-10-CM

## 2017-02-21 DIAGNOSIS — Z8041 Family history of malignant neoplasm of ovary: Secondary | ICD-10-CM

## 2017-02-21 DIAGNOSIS — R0603 Acute respiratory distress: Secondary | ICD-10-CM | POA: Diagnosis not present

## 2017-02-21 DIAGNOSIS — R918 Other nonspecific abnormal finding of lung field: Secondary | ICD-10-CM | POA: Diagnosis present

## 2017-02-21 DIAGNOSIS — R0600 Dyspnea, unspecified: Secondary | ICD-10-CM

## 2017-02-21 DIAGNOSIS — Z85828 Personal history of other malignant neoplasm of skin: Secondary | ICD-10-CM

## 2017-02-21 DIAGNOSIS — J9601 Acute respiratory failure with hypoxia: Secondary | ICD-10-CM | POA: Diagnosis not present

## 2017-02-21 DIAGNOSIS — E440 Moderate protein-calorie malnutrition: Secondary | ICD-10-CM | POA: Diagnosis present

## 2017-02-21 DIAGNOSIS — I482 Chronic atrial fibrillation: Secondary | ICD-10-CM | POA: Diagnosis not present

## 2017-02-21 DIAGNOSIS — Z7901 Long term (current) use of anticoagulants: Secondary | ICD-10-CM

## 2017-02-21 DIAGNOSIS — R0902 Hypoxemia: Secondary | ICD-10-CM

## 2017-02-21 LAB — CBC
HCT: 38.7 % — ABNORMAL LOW (ref 39.0–52.0)
Hemoglobin: 12.7 g/dL — ABNORMAL LOW (ref 13.0–17.0)
MCH: 30.5 pg (ref 26.0–34.0)
MCHC: 32.8 g/dL (ref 30.0–36.0)
MCV: 93 fL (ref 78.0–100.0)
PLATELETS: 333 10*3/uL (ref 150–400)
RBC: 4.16 MIL/uL — AB (ref 4.22–5.81)
RDW: 16.6 % — ABNORMAL HIGH (ref 11.5–15.5)
WBC: 8.4 10*3/uL (ref 4.0–10.5)

## 2017-02-21 LAB — COMPREHENSIVE METABOLIC PANEL
ALT: 16 U/L — ABNORMAL LOW (ref 17–63)
ANION GAP: 7 (ref 5–15)
AST: 25 U/L (ref 15–41)
Albumin: 1.7 g/dL — ABNORMAL LOW (ref 3.5–5.0)
Alkaline Phosphatase: 215 U/L — ABNORMAL HIGH (ref 38–126)
BUN: 26 mg/dL — ABNORMAL HIGH (ref 6–20)
CHLORIDE: 103 mmol/L (ref 101–111)
CO2: 27 mmol/L (ref 22–32)
Calcium: 8.4 mg/dL — ABNORMAL LOW (ref 8.9–10.3)
Creatinine, Ser: 0.95 mg/dL (ref 0.61–1.24)
GLUCOSE: 107 mg/dL — AB (ref 65–99)
POTASSIUM: 4.3 mmol/L (ref 3.5–5.1)
Sodium: 137 mmol/L (ref 135–145)
TOTAL PROTEIN: 6 g/dL — AB (ref 6.5–8.1)
Total Bilirubin: 0.6 mg/dL (ref 0.3–1.2)

## 2017-02-21 LAB — BRAIN NATRIURETIC PEPTIDE: B NATRIURETIC PEPTIDE 5: 626.5 pg/mL — AB (ref 0.0–100.0)

## 2017-02-21 LAB — TROPONIN I: TROPONIN I: 0.06 ng/mL — AB (ref <0.03)

## 2017-02-21 MED ORDER — ONDANSETRON HCL 4 MG PO TABS: 4.0000 mg | ORAL_TABLET | Freq: Four times a day (QID) | ORAL | Status: DC | PRN

## 2017-02-21 MED ORDER — HYDROCODONE-ACETAMINOPHEN 5-325 MG PO TABS
1.0000 | ORAL_TABLET | ORAL | Status: DC | PRN
  Administered 2017-02-22: 2 via ORAL
  Administered 2017-02-23: 1 via ORAL
  Administered 2017-02-25 – 2017-02-26 (×3): 2 via ORAL
  Administered 2017-02-27: 1 via ORAL
  Administered 2017-02-27 – 2017-02-28 (×2): 2 via ORAL
  Administered 2017-02-28 – 2017-03-01 (×4): 1 via ORAL
  Administered 2017-03-01 – 2017-03-02 (×2): 2 via ORAL
  Filled 2017-02-21: qty 2
  Filled 2017-02-21: qty 1
  Filled 2017-02-21 (×3): qty 2
  Filled 2017-02-21: qty 1
  Filled 2017-02-21: qty 2
  Filled 2017-02-21: qty 1
  Filled 2017-02-21: qty 2
  Filled 2017-02-21: qty 1
  Filled 2017-02-21 (×4): qty 2

## 2017-02-21 MED ORDER — FOLIC ACID 1 MG PO TABS
1.0000 mg | ORAL_TABLET | Freq: Every day | ORAL | Status: DC
  Administered 2017-02-22 – 2017-03-02 (×9): 1 mg via ORAL
  Filled 2017-02-21 (×9): qty 1

## 2017-02-21 MED ORDER — ACETAMINOPHEN 325 MG PO TABS
650.0000 mg | ORAL_TABLET | Freq: Four times a day (QID) | ORAL | Status: DC | PRN
  Filled 2017-02-21: qty 2

## 2017-02-21 MED ORDER — SODIUM CHLORIDE 0.9 % IV BOLUS (SEPSIS)
250.0000 mL | Freq: Once | INTRAVENOUS | Status: AC
  Administered 2017-02-21: 250 mL via INTRAVENOUS

## 2017-02-21 MED ORDER — GABAPENTIN 100 MG PO CAPS
100.0000 mg | ORAL_CAPSULE | Freq: Every day | ORAL | Status: DC
  Administered 2017-02-26: 100 mg via ORAL
  Filled 2017-02-21 (×4): qty 1

## 2017-02-21 MED ORDER — FUROSEMIDE 10 MG/ML IJ SOLN
20.0000 mg | Freq: Every day | INTRAMUSCULAR | Status: DC
  Administered 2017-02-23: 20 mg via INTRAVENOUS
  Filled 2017-02-21 (×2): qty 2

## 2017-02-21 MED ORDER — VITAMIN B-1 100 MG PO TABS
100.0000 mg | ORAL_TABLET | Freq: Every day | ORAL | Status: DC
  Administered 2017-02-22 – 2017-03-02 (×9): 100 mg via ORAL
  Filled 2017-02-21 (×9): qty 1

## 2017-02-21 MED ORDER — FUROSEMIDE 10 MG/ML IJ SOLN
20.0000 mg | Freq: Once | INTRAMUSCULAR | Status: AC
  Administered 2017-02-21: 20 mg via INTRAVENOUS
  Filled 2017-02-21: qty 2

## 2017-02-21 MED ORDER — ADULT MULTIVITAMIN W/MINERALS CH
1.0000 | ORAL_TABLET | Freq: Every day | ORAL | Status: DC
  Administered 2017-02-22 – 2017-03-02 (×9): 1 via ORAL
  Filled 2017-02-21 (×9): qty 1

## 2017-02-21 MED ORDER — ACETAMINOPHEN 650 MG RE SUPP: 650.0000 mg | Freq: Four times a day (QID) | RECTAL | Status: DC | PRN

## 2017-02-21 MED ORDER — RIVAROXABAN 20 MG PO TABS
20.0000 mg | ORAL_TABLET | Freq: Every day | ORAL | Status: DC
  Administered 2017-02-22 – 2017-02-25 (×4): 20 mg via ORAL
  Filled 2017-02-21 (×4): qty 1

## 2017-02-21 MED ORDER — ONDANSETRON HCL 4 MG/2ML IJ SOLN: 4.0000 mg | Freq: Four times a day (QID) | INTRAMUSCULAR | Status: DC | PRN

## 2017-02-21 NOTE — ED Notes (Signed)
Patient transported to X-ray

## 2017-02-21 NOTE — ED Provider Notes (Signed)
[facility] DEPT Provider Note   CSN: [id number] Arrival date & time: 02/21/17  1519   History   Chief Complaint Chief Complaint  Patient presents with  . Arm Swelling  . Hypotension  . Fatigue    HPI [name] is a 71 y.o. male.  The patient is a 71 year old male with past medical history significant for HFpEF (EF 65-70% 05/2016), COPD on oxygen at night, pulmonary hypertension, atrial fibrillation on Xarelto, and prostate cancer (no active therapy), who presents to the ED for left arm swelling and fatigue.  The patient was sent to the ED by his cardiologist after an appointment earlier today.  His left upper extremity swelling has been present for the past 4 months, but has worsened significantly over the past 2-3 days.  Chart review reveals a negative LUE venous Duplex on 02/10/17.  The patient is complaining of worsening fatigue for the past 2-3 days and shortness of breath when he awakens each morning.  He has recently made adjustments to his Lasix dose and reports an 8-pound weight gain over the past 10-12 days with worsening pedal edema.  He denies chest pain at this time.     The history is provided by the patient, medical records and the spouse. No language interpreter was used.   Past Medical History:  Diagnosis Date  . Anxiety   . Arthritis    "neck" (08/15/2015)  . Chronic bronchitis ([place])   . Chronic diastolic CHF (congestive heart failure) ([place])   . Chronic diastolic heart failure ([place]) 02/21/2017  . Constipation   . COPD (chronic obstructive pulmonary disease) ([place])   . DDD (degenerative disc disease), cervical   . Depression   . Dilated aortic root ([place])    28m on echo 05/2016  . Dysrhythmia    A-Fib  cardioverted on 10-11-2016  . Edema of left lower extremity   . Facial basal cell cancer   . H/O acne vulgaris 1960s   [facility]  . Headache    history of - left temporal- years ago- not current (08/15/2015)  .  Persistent atrial fibrillation (HCC)    on coumadin with CHADS2VASC score of 2  . Pneumonia 1960s; 2015 X 2  . Prostate cancer ([place] dx'd early 2000s   "low spreading; non aggressive type" (08/15/2015)  . Pulmonary HTN ([facility]    PASP 440mg by echo 05/2016  . Skin cancer    "back"    Patient Active Problem List   Diagnosis Date Noted  . Chronic diastolic heart failure (HCSasser10/04/2017  . Mediastinal lymphadenopathy 02/10/2017  . Generalized pain 12/30/2016  . Adjustment disorder with depressed mood 12/03/2016  . Bacteremia due to Streptococcus pneumoniae 11/19/2016  . Sepsis (HCC[place]07/02/2017  . Hypoalbuminemia due to protein-calorie malnutrition (HCCrom[place]07/02/2017  . Right leg pain 11/19/2016  . AKI (acute kidney injury) (HCBaileyton07/02/2017  . Acute urinary retention 11/19/2016  . Hyponatremia 11/18/2016  . Pneumonia 11/18/2016  . HCAP (healthcare-associated pneumonia) 11/18/2016  . History of prostate cancer 11/15/2016  . Chronic post-operative pain 11/15/2016  . Persistent atrial fibrillation (HCRice[place]  . Pulmonary HTN (HCMiddle [place]  . Dilated aortic root (HCPil[place]  . Septic shock (HCMedford Lakes  . Bilateral lower extremity edema   . Acute respiratory failure with hypoxia (HCGassville01/01/2017  . Lung mass 05/21/2016  . Loculated pleural effusion 05/21/2016  . Empyema (HCHillsboro01/01/2017  . Bilateral pleural effusion 05/14/2016  . COPD mixed  type ([place]) 05/14/2016  . Tobacco abuse 05/14/2016  . Protein-calorie malnutrition, severe ([place]) 05/14/2016    Past Surgical History:  Procedure Laterality Date  . CARDIOVERSION N/A 10/11/2016   Procedure: CARDIOVERSION;  Surgeon: Sanda Klein, MD;  [facility];  Service: Cardiovascular;  Laterality: N/A;  . COLONOSCOPY    . EXCISIONAL HEMORRHOIDECTOMY  1960s  . INGUINAL HERNIA REPAIR Right 2002  . INGUINAL HERNIA REPAIR Bilateral 08/15/2015  . INGUINAL HERNIA REPAIR Bilateral 08/15/2015   Procedure: OPEN REPAIR RECURRENT RIGHT INGUINAL HERNIA WITH MESH  AND REPAIR LEFT INGUINAL HERNIA WITH MESH;  Surgeon: [name], MD;  Location: [facility];  Service: General;  Laterality: Bilateral;  . INSERTION OF MESH Bilateral 08/15/2015   Procedure: INSERTION OF MESH;  Surgeon: [name], MD;  Location: [facility];  Service: General;  Laterality: Bilateral;  . IR THORACENTESIS ASP PLEURAL SPACE W/IMG GUIDE  11/18/2016  . LYMPH NODE BIOPSY Bilateral 10/22/2016   Procedure: LEFT NECK LYMPH NODE BIOPSY;  Surgeon: [name], MD;  Location: [facility];  Service: Thoracic;  Laterality: Bilateral;  . PROSTATE BIOPSY    . TONSILLECTOMY  1950s  . VIDEO ASSISTED THORACOSCOPY (VATS)/EMPYEMA Right 05/20/2016   Procedure: VIDEO ASSISTED THORACOSCOPY (VATS) with drainage of pleural effusion;  Surgeon: [name], MD;  [place] [facility] [place];  Service: Thoracic;  Laterality: Right;  [name] VIDEO BRONCHOSCOPY WITH ENDOBRONCHIAL ULTRASOUND Right 05/20/2016   Procedure: VIDEO BRONCHOSCOPY WITH ENDOBRONCHIAL ULTRASOUND;  Surgeon: [name], MD;  Location: [facility];  Service: Thoracic;  Laterality: Right;     Home Medications    Prior to Admission medications   Medication Sig Start Date End Date Taking? Authorizing Provider  acetaminophen (TYLENOL) 325 MG tablet Take 2 tablets (650 mg total) by mouth every 6 (six) hours as needed for mild pain or fever. 11/24/16   Hongalgi, Lenis Dickinson, MD  furosemide (LASIX) 40 MG tablet Take 0.5 tablets (20 mg total) by mouth every other day. 01/14/17   [name], MD  polyethylene glycol (MIRALAX / GLYCOLAX) packet Take 17 g by mouth daily as needed. 12/30/16   Nche, Charlene Brooke, NP  rivaroxaban (XARELTO) 20 MG TABS tablet Take 1 tablet (20 mg total) by mouth daily with supper. 01/14/17   [name], MD    Family History Family History  Problem Relation Age of Onset  . Colon cancer Mother   . Ovarian cancer Mother   . Alcoholism Father   . CAD Father   . Alcohol abuse Father   . Ovarian cancer Sister   . Diabetes Neg Hx   . Stomach  cancer Neg Hx     Social History Social History  Substance Use Topics  . Smoking status: Former Smoker    Packs/day: 1.00    Years: 62.00    Types: Cigarettes    Quit date: 05/14/2016  . Smokeless tobacco: Never Used  . Alcohol use No     Allergies   Demerol [meperidine] and Oxycodone hcl   Review of Systems Review of Systems  Constitutional: Negative for chills and fever.  HENT: Negative.   Eyes: Negative for visual disturbance.  Respiratory: Positive for shortness of breath. Negative for cough.   Cardiovascular: Positive for leg swelling. Negative for chest pain and palpitations.  Gastrointestinal: Negative for abdominal pain, constipation, diarrhea and vomiting.  Genitourinary: Negative for dysuria, flank pain and hematuria.  Musculoskeletal: Negative for arthralgias and back pain.       Left shoulder pain, LUE swelling  Skin: Positive for rash (LUE). Negative for color change.  Allergic/Immunologic: Negative for immunocompromised state.  Neurological: Negative for seizures and syncope.  Hematological: Negative.   Psychiatric/Behavioral: Negative.   All other systems reviewed and are negative.  Physical Exam Updated Vital Signs BP (!) 85/62 (BP Location: Right Arm)   Pulse 67   Temp (!) 97.3 F (36.3 C) (Oral)   Resp 18   SpO2 95%   Physical Exam  Constitutional: He appears well-developed and well-nourished. No distress.  Elderly, cachectic male in no acute distress  HENT:  Head: Normocephalic and atraumatic.  Eyes: Conjunctivae and EOM are normal.  Neck: Neck supple.  Cardiovascular: Normal rate.   No murmur heard. Irregularly irregular  Pulmonary/Chest: Effort normal. No respiratory distress. He has no wheezes. He has rales (Fine rales in lung bases).  Hypoxic to mid 80s on my initial exam on room air  Abdominal: Soft. There is no tenderness.  Musculoskeletal: He exhibits edema (pitting edema in LUE and bilateral lower extremities).  Normal gross motor  function and sensation to left upper extremity including the hand, wrist, and elbow; tenderness to palpation to the left shoulder grossly with limited ROM due to pain  Neurological: He is alert.  Skin: Skin is warm and dry.  Psychiatric: He has a normal mood and affect.  Nursing note and vitals reviewed.  ED Treatments / Results  Labs (all labs ordered are listed, but only abnormal results are displayed) Labs Reviewed - No data to display  EKG  EKG Interpretation None      Radiology No results found.  Procedures Procedures (including critical care time)  Medications Ordered in ED Medications - No data to display  Initial Impression / Assessment and Plan / ED Course  I have reviewed the triage vital signs and the nursing notes.  Pertinent labs & imaging results that were available during my care of the patient were reviewed by me and considered in my medical decision making (see chart for details).    Initial differential diagnosis included CHF, nephrotic syndrome, lymphedema, medication problem, ACS, hypoalbuminemia, failure to thrive, and deconditioning.  Pertinent labs included CBC without leukocytosis; normocytic anemia noted.  CMP with hypoalbuminemia; no AKI.  Alk phos elevated without transaminitis.  BNP elevated 626.5, c/w patient's recent studies.  Troponin elevated to 0.06, c/w baseline.  EKG with a normal rate and in atrial fibrillation; narrow QRS and normal QTc.  T wave inversions noted in V4-V6, however similar when compared to EKG from 12/09/16.  Imaging studies included a CXR with bilateral pleural effusions and diffuse intersitial fluid.  The patient was given a small IVF bolus for hypotension and a small IV lasix dose for diuresis.   The patient's condition is complicated by hypotension and fluid overload, both in the setting of hypoalbuminemia.  Based on the above findings, I suspect his fatigue and pedal edema are likely related to recent medication changes and/or  worsening heart failure.  Hypoalbuminemia is also likely contributory.  I believe he will benefit significantly from further inpatient work-up and evaluation.  The patient was admitted to the Hospitalist service for further evaluation and treatment.  The patient was in stable condition at the time of admission.  Final Clinical Impressions(s) / ED Diagnoses   Final diagnoses:  None   New Prescriptions New Prescriptions   No medications on file     Charisse March, MD 02/22/17 [id number]    Davonna Belling, MD 02/22/17 1515

## 2017-02-21 NOTE — Patient Instructions (Signed)
Please report to the Emergency Department at [facility].  Follow-Up: Follow up as directed after your hospitalization.  If you need a refill on your cardiac medications before your next appointment, please call your pharmacy.  Thank you for choosing [facility]!!

## 2017-02-21 NOTE — ED Notes (Signed)
ED Provider at bedside. 

## 2017-02-21 NOTE — Telephone Encounter (Signed)
Patient has appt today with Dr. Radford Pax.

## 2017-02-21 NOTE — H&P (Signed)
[name] BWL:[id number] DOB: 1946-02-14 DOA: 02/21/2017     PCP: Flossie Buffy, NP   Outpatient Specialists: Cardiology Golden Hurter, Oncology Alen Blew, Urology Jeffie Pollock, Pulmonology Young Patient coming from:    home Lives  With family    Chief Complaint: Fatigue and generalized swelling HPI: [name] is a 71 y.o. male with medical history significant of PAF on Xarelto status post DCCV 10/18/09, diastolic CHF with LVEF 57-26% on echo 05/14/16, COPD with pulmonary hypertension, s/p right VATS 05/20/16 for empyema  underlying lung CA with lymph node biopsy 10/22/16 negative for CA.   Presented with  Recently noted to be hypotensive his lasix was decreased last week but after that he developed leg edema. He has had decreased by mouth intake and increased fatigue he was seen in cardiology office today and was noted to have weight increased from 153 pounds on 27th of September to 165 pounds today  Audiology have had difficulty managing his diuresis secondary to recurrent hypotension. Patient was sent to emergency department for further management and evaluation. In ER noted to be again hypotensive abdomen down to 1.7 generalized anasarca. Patient had had negative lower extremity Doppler on October 1. His Lasix dose was decreased to 20 g every other day. Family states he does not tolerate Ensure or glucerna.  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 Last echogram showed preserved EF moderately to severe dilated right atrium and moderate TR and mild to moderate pulmonary hypertension  IN ER:  Temp (24hrs), Avg:97.3 F (36.3 C), Min:97.3 F (36.3 C), Max:97.3 F (36.3 C)      on arrival  ED Triage Vitals  Enc Vitals Group     BP 02/21/17 1549 (!) 85/62     Pulse Rate 02/21/17 1549 67     Resp 02/21/17 1549 18     Temp 02/21/17 1549  (!) 97.3 F (36.3 C)     Temp Source 02/21/17 1549 Oral     SpO2 02/21/17 1549 95 %     Weight --      Height --      Head Circumference --      Peak Flow --      Pain Score 02/21/17 1552 5     Pain Loc --      Pain Edu? --      Excl. in [place]? --     Latest RR 18 87% HR 74 BP 104/68 Wbc 8.4 HG 12.7 PLT 333 BNP 626 Na 137 K 4.3 BUN 26 Alb 1.7 Trop 0.6 at baseline  Succession allergen bilateral effusion left greater than right  Following Medications were ordered in ER: Medications  sodium chloride 0.9 % bolus 250 mL (250 mLs Intravenous New Bag/Given 02/21/17 1911)  furosemide (LASIX) injection 20 mg (20 mg Intravenous Given 02/21/17 1911)     Hospitalist was called for admission for fluid over load  Review of Systems:    Pertinent positives include:  fatigue, shortness of breath at rest.  dyspnea on exertion,  Bilateral lower extremity swelling anasarca,  Constitutional:  No weight loss, night sweats, Fevers, chills, weight loss  HEENT:  No headaches, Difficulty swallowing,Tooth/dental problems,Sore throat,  No sneezing, itching, ear ache, nasal congestion, post nasal drip,  Cardio-vascular:  No chest pain, Orthopnea, PND,  dizziness, palpitations  GI:  No heartburn, indigestion, abdominal pain, nausea, vomiting, diarrhea, change in  bowel habits, loss of appetite, melena, blood in stool, hematemesis Resp:   No excess mucus, no productive cough, No non-productive cough, No coughing up of blood.No change in color of mucus.No wheezing. Skin:  no rash or lesions. No jaundice GU:  no dysuria, change in color of urine, no urgency or frequency. No straining to urinate.  No flank pain.  Musculoskeletal:  No joint pain or no joint swelling. No decreased range of motion. No back pain.  Psych:  No change in mood or affect. No depression or anxiety. No memory loss.  Neuro: no localizing neurological complaints, no tingling, no weakness, no double vision, no gait abnormality, no  slurred speech, no confusion  As per HPI otherwise 10 point review of systems negative.   Past Medical History: Past Medical History:  Diagnosis Date  . Anxiety   . Arthritis    "neck" (08/15/2015)  . Chronic bronchitis ([place])   . Chronic diastolic CHF (congestive heart failure) ([place])   . Chronic diastolic heart failure ([place]) 02/21/2017  . Constipation   . COPD (chronic obstructive pulmonary disease) ([place])   . DDD (degenerative disc disease), cervical   . Depression   . Dilated aortic root ([place])    55m on echo 05/2016  . Dysrhythmia    A-Fib  cardioverted on 10-11-2016  . Edema of left lower extremity   . Facial basal cell cancer   . H/O acne vulgaris 1960s   [facility]"  . Headache    history of - left temporal- years ago- not current (08/15/2015)  . Persistent atrial fibrillation (HCC)    on coumadin with CHADS2VASC score of 2  . Pneumonia 1960s; 2015 X 2  . Prostate cancer ([facility]erdinand dx'd early 2000s   "low spreading; non aggressive type" (08/15/2015)  . Pulmonary HTN ([place]    PASP 439mg by echo 05/2016  . Skin cancer    "back"   Past Surgical History:  Procedure Laterality Date  . CARDIOVERSION N/A 10/11/2016   Procedure: CARDIOVERSION;  Surgeon: CrSanda KleinMD;  [facility];  Service: Cardiovascular;  Laterality: N/A;  . COLONOSCOPY    . EXCISIONAL HEMORRHOIDECTOMY  1960s  . INGUINAL HERNIA REPAIR Right 2002  . INGUINAL HERNIA REPAIR Bilateral 08/15/2015  . INGUINAL HERNIA REPAIR Bilateral 08/15/2015   Procedure: OPEN REPAIR RECURRENT RIGHT INGUINAL HERNIA WITH MESH AND REPAIR LEFT INGUINAL HERNIA WITH MESH;  Surgeon: HaFanny SkatesMD;  [place] Service: General;  Laterality: Bilateral;  . INSERTION OF MESH Bilateral 08/15/2015   Procedure: INSERTION OF MESH;  Surgeon: HaFanny SkatesMD;  [facility] Service: General;  Laterality: Bilateral;  . IR THORACENTESIS ASP PLEURAL SPACE W/IMG GUIDE  11/18/2016  .  LYMPH NODE BIOPSY Bilateral 10/22/2016   Procedure: LEFT NECK LYMPH NODE BIOPSY;  Surgeon: VaIvin PootMD;  [facility] Service: Thoracic;  Laterality: Bilateral;  . PROSTATE BIOPSY    . TONSILLECTOMY  1950s  . VIDEO ASSISTED THORACOSCOPY (VATS)/EMPYEMA Right 05/20/2016   Procedure: VIDEO ASSISTED THORACOSCOPY (VATS) with drainage of pleural effusion;  Surgeon: PeIvin PootMD;  [place] [facility] Service: Thoracic;  Laterality: Right;  . Marland KitchenIDEO BRONCHOSCOPY WITH ENDOBRONCHIAL ULTRASOUND Right 05/20/2016   Procedure: VIDEO BRONCHOSCOPY WITH ENDOBRONCHIAL ULTRASOUND;  Surgeon: PeIvin PootMD;  [facility] Service: Thoracic;  Laterality: Right;     Social History:  Ambulatory   independently     reports  that he quit smoking about 9 months ago. His smoking use included Cigarettes. He has a 62.00 pack-year smoking history. He has never used smokeless tobacco. He reports that he does not drink alcohol or use drugs.  Allergies:   Allergies  Allergen Reactions  . Demerol [Meperidine] Other (See Comments)    UNSPECIFIED REACTION  Causes system to shutdown. ?   . Oxycodone Hcl Other (See Comments)    Pt states this medication 'wires him up' and makes pt hyper; pt does not want to take this ever again     Family History:   Family History  Problem Relation Age of Onset  . Colon cancer Mother   . Ovarian cancer Mother   . Alcoholism Father   . CAD Father   . Alcohol abuse Father   . Ovarian cancer Sister   . Diabetes Neg Hx   . Stomach cancer Neg Hx     Medications: Prior to Admission medications   Medication Sig Start Date End Date Taking? Authorizing Provider  acetaminophen (TYLENOL) 325 MG tablet Take 2 tablets (650 mg total) by mouth every 6 (six) hours as needed for mild pain or fever. 11/24/16  Yes Hongalgi, Lenis Dickinson, MD  furosemide (LASIX) 40 MG tablet Take 0.5 tablets (20 mg total) by mouth every other day. 01/14/17  Yes Turner, Eber Hong, MD  polyethylene  glycol (MIRALAX / GLYCOLAX) packet Take 17 g by mouth daily as needed. 12/30/16  Yes Nche, Charlene Brooke, NP  rivaroxaban (XARELTO) 20 MG TABS tablet Take 1 tablet (20 mg total) by mouth daily with supper. 01/14/17  Yes [name], MD    Physical Exam: Patient Vitals for the past 24 hrs:  BP Temp Temp src Pulse Resp SpO2  02/21/17 1900 104/68 - - 74 18 (!) 87 %  02/21/17 1845 102/70 - - 71 - -  02/21/17 1830 97/73 - - 68 18 -  02/21/17 1815 95/63 - - 65 (!) 21 93 %  02/21/17 1745 94/69 - - 69 19 (!) 86 %  02/21/17 1730 95/72 - - 60 13 (!) 88 %  02/21/17 1715 93/67 - - 68 16 91 %  02/21/17 1645 95/70 - - 65 13 92 %  02/21/17 1549 (!) 85/62 (!) 97.3 F (36.3 C) Oral 67 18 95 %    1. General:  in No Acute distress  Chronically ill   -appearing 2. Psychological: Alert and   Oriented 3. Head/ENT:   Moist  Mucous Membranes                          Head Non traumatic, neck supple                        Poor Dentition 4. SKIN: normal  Skin turgor,  Skin clean Dry and intact no rash 5. Heart: Regular rate and rhythm no  Murmur, no Rub or gallop 6. Lungs:  no wheezes or crackles   7. Abdomen: Soft, non-tender, Non distended bowel sounds present 8. Lower extremities: no clubbing, cyanosis, 2+ edema 9. Neurologically Grossly intact, moving all 4 extremities equally   10. MSK: Normal range of motion   body mass index is unknown because there is no height or weight on file.  Labs on Admission:   Labs on Admission: I have personally reviewed following labs and imaging studies  CBC:  Recent Labs Lab 02/21/17 1637  WBC 8.4  HGB  12.7*  HCT 38.7*  MCV 93.0  PLT 671   Basic Metabolic Panel:  Recent Labs Lab 02/21/17 1637  NA 137  K 4.3  CL 103  CO2 27  GLUCOSE 107*  BUN 26*  CREATININE 0.95  CALCIUM 8.4*   GFR: Estimated Creatinine Clearance: 73.7 mL/min (by C-G formula based on SCr of 0.95 mg/dL). Liver Function Tests:  Recent Labs Lab 02/21/17 1637  AST 25    ALT 16*  ALKPHOS 215*  BILITOT 0.6  PROT 6.0*  ALBUMIN 1.7*   No results for input(s): LIPASE, AMYLASE in the last 168 hours. No results for input(s): AMMONIA in the last 168 hours. Coagulation Profile: No results for input(s): INR, PROTIME in the last 168 hours. Cardiac Enzymes:  Recent Labs Lab 02/21/17 1637  TROPONINI 0.06*   BNP (last 3 results) No results for input(s): PROBNP in the last 8760 hours. HbA1C: No results for input(s): HGBA1C in the last 72 hours. CBG: No results for input(s): GLUCAP in the last 168 hours. Lipid Profile: No results for input(s): CHOL, HDL, LDLCALC, TRIG, CHOLHDL, LDLDIRECT in the last 72 hours. Thyroid Function Tests: No results for input(s): TSH, T4TOTAL, FREET4, T3FREE, THYROIDAB in the last 72 hours. Anemia Panel: No results for input(s): VITAMINB12, FOLATE, FERRITIN, TIBC, IRON, RETICCTPCT in the last 72 hours. Urine analysis:  Sepsis Labs: _0 (procalcitonin:4,lacticidven:4) )No results found for this or any previous visit (from the past 240 hour(s)).     UA  ordered  No results found for: HGBA1C  Estimated Creatinine Clearance: 73.7 mL/min (by C-G formula based on SCr of 0.95 mg/dL).  BNP (last 3 results) No results for input(s): PROBNP in the last 8760 hours.   ECG REPORT  Independently reviewed Rate: 75  Rhythm: Incomplete right-sided block ST&T Change: No acute ischemic changes   QTC 458  There were no vitals filed for this visit.   Cultures:    Component Value Date/Time   SDES BLOOD BLOOD RIGHT HAND 11/20/2016 0547   SDES BLOOD BLOOD LEFT HAND 11/20/2016 0547   SPECREQUEST  11/20/2016 0547    Blood Culture adequate volume BOTTLES DRAWN AEROBIC AND ANAEROBIC   SPECREQUEST  11/20/2016 0547    Blood Culture adequate volume BOTTLES DRAWN AEROBIC AND ANAEROBIC   CULT NO GROWTH 5 DAYS 11/20/2016 0547   CULT NO GROWTH 5 DAYS 11/20/2016 0547   REPTSTATUS 11/25/2016 FINAL 11/20/2016 0547   REPTSTATUS  11/25/2016 FINAL 11/20/2016 0547     Radiological Exams on Admission: Dg Chest 2 View  Result Date: 02/21/2017 CLINICAL DATA:  Left upper extremity swelling for 3 months. Worsening fatigue. Dyspnea. EXAM: CHEST  2 VIEW COMPARISON:  12/11/2016 FINDINGS: Moderate pleural effusions bilaterally, enlarged. Diffuse interstitial thickening or fluid, worsened. Moderate cardiomegaly, unchanged. Basilar lung opacity adjacent to the pleural fluid collections, left greater than right, atelectasis versus infectious infiltrate. IMPRESSION: Enlarging bilateral effusions, left greater than right. Diffuse interstitial fluid or thickening, worsened. Electronically Signed   By: Andreas Newport M.D.   On: 02/21/2017 18:07    Chart has been reviewed    Assessment/Plan   71 y.o. male with medical history significant of PAF on Xarelto status post DCCV 06/16/56, diastolic CHF with LVEF 09-98% on echo 05/14/16, COPD with pulmonary hypertension, s/p right VATS 05/20/16 for empyema  underlying lung CA with lymph node biopsy 10/22/16 negative for CA.      Admitted for generalized fatigue hypotension and fluid overload in a setting of hypoalbuminemia  Present on Admission: . Acute respiratory failure  with hypoxia ([place]) - improving with gentle diuresis, Will appreciate cardiology consult to help manage difficult fluid status . COPD mixed type ([place]) - currently appears to be stable . Hypoalbuminemia due to protein-calorie malnutrition (HCC) - check prealbumin, nutritional consult very low alb of 1.7 likely contributing to anasarca. May benefit from albumin infusion . Lung mass - follow up with Oncology    . Hypotension - - avoid over diuresis careful management will be helpful to eval cardiac output  PAF           - CHA2DS2 vas score 4  : continue current anticoagulation with  Xarelto,         -  Rate control:  Currently controlled      Chronic generalized pain - seem neuropathic in nature will try low dose Neurontin  and titrate if able   Other plan as per orders.  DVT prophylaxis:  Xarelto    Code Status:  FULL CODE   as per patient    Family Communication:   Family   at  Bedside  plan of care was discussed with  Wife  Disposition Plan:   likely will need placement for rehabilitation                                  Would benefit from PT/OT eval prior to DC ordered                      Nutrition  consulted                          Consults called: email cardiology    Admission status:    Inpatient    Level of care      tele            I have spent a total of 56 min on this admission    Fronia Depass 02/21/2017, 10:29 PM    [facility]  Pager [phone]   after 2 AM please page floor coverage PA If 7AM-7PM, please contact the day team taking care of the patient  [email or web address]  Password [email or web address]

## 2017-02-21 NOTE — Progress Notes (Signed)
Cardiology Office Note:    Date:  02/22/2017   ID:  [name], DOB 17-Mar-1946, MRN [id number]  PCP:  Christopher Buffy, NP  Cardiologist:  Christopher Him, MD   Referring MD: Christopher Buffy, NP   Chief Complaint  Patient presents with  . Atrial Fibrillation  . Congestive Heart Failure    History of Present Illness:    [name] is a 71 y.o. male with a hx of PAF on Xarelto status post DCCV 11/16/26, diastolic CHF with LVEF 31-51% on echo 05/14/16, COPD with pulmonary hypertension, s/p right VATS 05/20/16 for empyema with a concern for underlying lung CA with lymph node biopsy 10/22/16 negative for CA. He saw Dr. Prescott [name] and was noted to be in A. fib and was hypotensive. His diuretics were cut back and he then developed leg edema.   [name], [place] 11/15/16 at which time he was not feeling well. He was unable to tell when he was in atrial fibrillation. He had persistent anorexia. Aldactone 12.5 mg daily was resumed.  He was readmitted to the hospital 11/17/16 with bacteremia due to Streptococcus pneumonia. Patient also had anasarca and was treated with IV Lasix. Weight decreased from 184 pounds to 170 pounds. 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 2-D echo was done and LVEF was 55-60% moderately to severely dilated right atrium moderate TR and mild to moderately increased pulmonary pressures.   He was seen by Christopher Husk, PA 12/09/2016 and weight was159.  He complained of having no appetite. His edema had improved and he was back on Lasix 40 mg once daily. He has called recently over the past few days stating that he has been gaining weight with swelling in his ankles, feet and arms.  His last albumin last month was 2.4.  He is here today with ongoing LE edema.  His wife says that his appetite has recently picked up.  He also has a rash on his left forearm that he does not know how he got.  The same left arm has become markedly  edematous over the past week and his PCP got an [place] that did not reveal any DVT.  He has continued to have SOB and LE edema that has worsened.  His diuretic regimen has been difficult to manage due to soft BP.  Past Medical History:  Diagnosis Date  . Anxiety   . Arthritis    "neck" (08/15/2015)  . Chronic bronchitis ([place])   . Chronic diastolic CHF (congestive heart failure) ([place])   . Constipation   . COPD (chronic obstructive pulmonary disease) ([place])   . DDD (degenerative disc disease), cervical   . Depression   . Dilated aortic root ([place])    72m on echo 05/2016  . Edema of left lower extremity   . Facial basal cell cancer   . H/O acne vulgaris 1960s   [facility]  . Headache    history of - left temporal- years ago- not current (08/15/2015)  . Persistent atrial fibrillation (HCC)    on coumadin with CHADS2VASC score of 2  . Pneumonia 1960s; 2015 X 2  . Prostate cancer ([place] dx'd early 2000s   "low spreading; non aggressive type" (08/15/2015)  . Pulmonary HTN ([place]    PASP 473mg by echo 05/2016  . Skin cancer    "back"    Past Surgical History:  Procedure Laterality Date  .  CARDIOVERSION N/A 10/11/2016   Procedure: CARDIOVERSION;  Surgeon: Christopher Klein, MD;  [facility];  Service: Cardiovascular;  Laterality: N/A;  . COLONOSCOPY    . EXCISIONAL HEMORRHOIDECTOMY  1960s  . INGUINAL HERNIA REPAIR Right 2002  . INGUINAL HERNIA REPAIR Bilateral 08/15/2015  . INGUINAL HERNIA REPAIR Bilateral 08/15/2015   Procedure: OPEN REPAIR RECURRENT RIGHT INGUINAL HERNIA WITH MESH AND REPAIR LEFT INGUINAL HERNIA WITH MESH;  Surgeon: Christopher Skates, MD;  Location: [place];  Service: General;  Laterality: Bilateral;  . INSERTION OF MESH Bilateral 08/15/2015   Procedure: INSERTION OF MESH;  Surgeon: Christopher Skates, MD;  Location: [place];  Service: General;  Laterality: Bilateral;  . IR THORACENTESIS ASP PLEURAL SPACE W/IMG GUIDE  11/18/2016  . LYMPH NODE  BIOPSY Bilateral 10/22/2016   Procedure: LEFT NECK LYMPH NODE BIOPSY;  Surgeon: Christopher Poot, MD;  Location: [place];  Service: Thoracic;  Laterality: Bilateral;  . PROSTATE BIOPSY    . TONSILLECTOMY  1950s  . VIDEO ASSISTED THORACOSCOPY (VATS)/EMPYEMA Right 05/20/2016   Procedure: VIDEO ASSISTED THORACOSCOPY (VATS) with drainage of pleural effusion;  Surgeon: Christopher Poot, MD;  Location: [place];  Service: Thoracic;  Laterality: Right;  [name] VIDEO BRONCHOSCOPY WITH ENDOBRONCHIAL ULTRASOUND Right 05/20/2016   Procedure: VIDEO BRONCHOSCOPY WITH ENDOBRONCHIAL ULTRASOUND;  Surgeon: Christopher Poot, MD;  [facility];  Service: Thoracic;  Laterality: Right;    Current Medications: Current Meds  Medication Sig  . acetaminophen (TYLENOL) 325 MG tablet Take 2 tablets (650 mg total) by mouth every 6 (six) hours as needed for mild pain or fever.  . furosemide (LASIX) 40 MG tablet Take 0.5 tablets (20 mg total) by mouth every other day.  . polyethylene glycol (MIRALAX / GLYCOLAX) packet Take 17 g by mouth daily as needed.  . rivaroxaban (XARELTO) 20 MG TABS tablet Take 1 tablet (20 mg total) by mouth daily with supper.     Allergies:   Demerol [meperidine] and Oxycodone hcl   Social History   Social History  . Marital status: Married    Spouse name: N/A  . Number of children: N/A  . Years of education: N/A   Social History Main Topics  . Smoking status: Former Smoker    Packs/day: 1.00    Years: 62.00    Types: Cigarettes    Quit date: 05/14/2016  . Smokeless tobacco: Never Used  . Alcohol use No  . Drug use: No     Comment: CBD OIL  . Sexual activity: Not Currently    Partners: Female   Other Topics Concern  . None   Social History Narrative  . None     Family History: The patient's family history includes Alcohol abuse in his father; Alcoholism in his father; CAD in his father; Colon cancer in his mother; Ovarian cancer in his mother and sister. There is no history of Diabetes  or Stomach cancer.  ROS:   Please see the history of present illness.    ROS  All other systems reviewed and negative.   EKGs/Labs/Other Studies Reviewed:    The following studies were reviewed today: none  EKG:  EKG is not ordered today.    Recent Labs: 02/21/2017: B Natriuretic Peptide 626.5 02/22/2017: ALT 14; BUN 25; Creatinine, Ser 0.86; Hemoglobin 10.5; Magnesium 1.4; Platelets 326; Potassium 3.9; Sodium 136; TSH 2.489   Recent Lipid Panel No results found for: CHOL, TRIG, HDL, CHOLHDL, VLDL, LDLCALC, LDLDIRECT  Physical Exam:    VS:  BP 92/60  Pulse 79   Ht _0  (1.803 m)   Wt 165 lb 12.8 oz (75.2 kg)   SpO2 96%   BMI 23.12 kg/m     Wt Readings from Last 3 Encounters:  02/22/17 161 lb (73 kg)  02/21/17 165 lb 12.8 oz (75.2 kg)  02/06/17 153 lb (69.4 kg)     GEN: thin ill appearing WM in NAD HEENT: Normal NECK: No JVD; No carotid bruits LYMPHATICS: No lymphadenopathy CARDIAC: RRR, no murmurs, rubs, gallops RESPIRATORY:  Clear to auscultation without rales, wheezing or rhonchi  ABDOMEN: Soft, non-tender, non-distended MUSCULOSKELETAL: rash over left forearm that appears to possibly be due to insect bites.  There is marked pitting edema of the Left forearm extending into the elbow.  2+ pitting edema of the legs bilaterally SKIN: Warm and dry NEUROLOGIC:  Alert and oriented x 3 PSYCHIATRIC:  Normal affect   ASSESSMENT:    1. Persistent atrial fibrillation ([place])   2. Pulmonary HTN ([place])   3. Dilated aortic root ([place])   4. Acute on chronic combined systolic and diastolic CHF (congestive heart failure) ([place])    PLAN:    In order of problems listed above:  1. Persistent atrial fibrillation - he is maintaining NSR on exam today. He will continue on Xarelto for CHADS2VASC score of 2.  We have not been able to keep on BB due to soft BP.    2.  Acute on Chronic diastolic CHF - his weight is up 12lbs from a few weeks ago.  Unfortunately we have had  difficulty in managing his diuretics due to hypotension.  I think that some of his volume overload and edema is due to hypoalbuminemia.   He has preferential swelling of his LUE with etiology unclear to me at this time. I am concerned about a DVT but venous doppler was negative on 02/10/2017.  I do not think the left arm swelling is due to CHF.  He clearly is volume overloaded with increased weight but again I think a lot of his problem is due to malnutrition and hypoalbuminea.  I am concerned that with the history of pulmonary HTN that he may have right heart failure.  At this time with a SBP of 69mHg he needs to come in for diuresis and close following of his BP and renal function. I will repeat 2D echo to see if there has been a change in LVF.  I think that he will likely need a right heart cath and I will ask advanced HF to see in the hospital.    3.  Pulmonary HTN - moderate with PASP 458mg by echo 11/2016 likely related to Group 2 pulmonary venous HTN.  He will continue on diuretics as BP tolerates. He needs a right heart cath to help manage therapy.   4.  Dilated aortic root - stable by echo at 3522m    Medication Adjustments/Labs and Tests Ordered: Current medicines are reviewed at length with the patient today.  Concerns regarding medicines are outlined above.  No orders of the defined types were placed in this encounter.  No orders of the defined types were placed in this encounter.   Signed, TraFransico HimD  02/22/2017 4:10 PM    [place]

## 2017-02-21 NOTE — ED Triage Notes (Signed)
Pt presents to ED from his cardiologist for assessment of left arm swelling x 3 months, also increasing fatigue and swelling x 4 days, with SOB when rising in the am, and hypotension at his cardiologist's and at triage.

## 2017-02-22 ENCOUNTER — Inpatient Hospital Stay (HOSPITAL_COMMUNITY): Payer: Medicare Other

## 2017-02-22 ENCOUNTER — Encounter: Payer: Self-pay | Admitting: Cardiology

## 2017-02-22 DIAGNOSIS — I340 Nonrheumatic mitral (valve) insufficiency: Secondary | ICD-10-CM

## 2017-02-22 DIAGNOSIS — I361 Nonrheumatic tricuspid (valve) insufficiency: Secondary | ICD-10-CM

## 2017-02-22 DIAGNOSIS — R7989 Other specified abnormal findings of blood chemistry: Secondary | ICD-10-CM

## 2017-02-22 DIAGNOSIS — J9601 Acute respiratory failure with hypoxia: Secondary | ICD-10-CM

## 2017-02-22 DIAGNOSIS — E8809 Other disorders of plasma-protein metabolism, not elsewhere classified: Secondary | ICD-10-CM

## 2017-02-22 LAB — TROPONIN I
Troponin I: 0.07 ng/mL (ref <0.03)
Troponin I: 0.07 ng/mL (ref <0.03)
Troponin I: 0.06 ng/mL (ref <0.03)

## 2017-02-22 LAB — PROTEIN, PLEURAL OR PERITONEAL FLUID

## 2017-02-22 LAB — LACTATE DEHYDROGENASE, PLEURAL OR PERITONEAL FLUID: LD, Fluid: 24 U/L — ABNORMAL HIGH (ref 3–23)

## 2017-02-22 LAB — COMPREHENSIVE METABOLIC PANEL
ALBUMIN: 1.5 g/dL — AB (ref 3.5–5.0)
ALT: 15 U/L — ABNORMAL LOW (ref 17–63)
ALT: 14 U/L — ABNORMAL LOW (ref 17–63)
ANION GAP: 7 (ref 5–15)
ANION GAP: 8 (ref 5–15)
AST: 22 U/L (ref 15–41)
AST: 22 U/L (ref 15–41)
Albumin: 1.6 g/dL — ABNORMAL LOW (ref 3.5–5.0)
Alkaline Phosphatase: 185 U/L — ABNORMAL HIGH (ref 38–126)
Alkaline Phosphatase: 183 U/L — ABNORMAL HIGH (ref 38–126)
BILIRUBIN TOTAL: 0.6 mg/dL (ref 0.3–1.2)
BUN: 24 mg/dL — ABNORMAL HIGH (ref 6–20)
BUN: 25 mg/dL — ABNORMAL HIGH (ref 6–20)
CALCIUM: 7.9 mg/dL — AB (ref 8.9–10.3)
CHLORIDE: 104 mmol/L (ref 101–111)
CHLORIDE: 103 mmol/L (ref 101–111)
CO2: 27 mmol/L (ref 22–32)
CO2: 25 mmol/L (ref 22–32)
Calcium: 8.1 mg/dL — ABNORMAL LOW (ref 8.9–10.3)
Creatinine, Ser: 0.95 mg/dL (ref 0.61–1.24)
Creatinine, Ser: 0.86 mg/dL (ref 0.61–1.24)
GFR calc Af Amer: >60 mL/min (ref >60)
GFR calc non Af Amer: >60 mL/min (ref >60)
Glucose, Bld: 88 mg/dL (ref 65–99)
Glucose, Bld: 88 mg/dL (ref 65–99)
POTASSIUM: 4 mmol/L (ref 3.5–5.1)
Potassium: 3.9 mmol/L (ref 3.5–5.1)
SODIUM: 136 mmol/L (ref 135–145)
Sodium: 138 mmol/L (ref 135–145)
TOTAL PROTEIN: 5.4 g/dL — AB (ref 6.5–8.1)
Total Bilirubin: 0.4 mg/dL (ref 0.3–1.2)
Total Protein: 5.5 g/dL — ABNORMAL LOW (ref 6.5–8.1)

## 2017-02-22 LAB — BODY FLUID CELL COUNT WITH DIFFERENTIAL
EOS FL: 1 %
Lymphs, Fluid: 61 %
Monocyte-Macrophage-Serous Fluid: 26 % — ABNORMAL LOW (ref 50–90)
Neutrophil Count, Fluid: 12 % (ref 0–25)
WBC FLUID: 321 uL (ref 0–1000)

## 2017-02-22 LAB — GLUCOSE, PLEURAL OR PERITONEAL FLUID: Glucose, Fluid: 107 mg/dL

## 2017-02-22 LAB — PREALBUMIN: PREALBUMIN: 14.9 mg/dL — AB (ref 18–38)

## 2017-02-22 LAB — CBC
HEMATOCRIT: 32.6 % — AB (ref 39.0–52.0)
HEMOGLOBIN: 10.5 g/dL — AB (ref 13.0–17.0)
MCH: 29.7 pg (ref 26.0–34.0)
MCHC: 32.2 g/dL (ref 30.0–36.0)
MCV: 92.4 fL (ref 78.0–100.0)
PLATELETS: 326 10*3/uL (ref 150–400)
RBC: 3.53 MIL/uL — ABNORMAL LOW (ref 4.22–5.81)
RDW: 16.6 % — AB (ref 11.5–15.5)
WBC: 8.2 10*3/uL (ref 4.0–10.5)

## 2017-02-22 LAB — PHOSPHORUS: PHOSPHORUS: 3.9 mg/dL (ref 2.5–4.6)

## 2017-02-22 LAB — ECHOCARDIOGRAM COMPLETE
Height: 71 in
Weight: 2576 oz

## 2017-02-22 LAB — MAGNESIUM: MAGNESIUM: 1.4 mg/dL — AB (ref 1.7–2.4)

## 2017-02-22 LAB — TSH: TSH: 2.489 u[IU]/mL (ref 0.350–4.500)

## 2017-02-22 LAB — ALBUMIN, PLEURAL OR PERITONEAL FLUID: Albumin, Fluid: <1 g/dL

## 2017-02-22 MED ORDER — IOPAMIDOL (ISOVUE-370) INJECTION 76%
INTRAVENOUS | Status: AC
  Administered 2017-02-22: 100 mL
  Filled 2017-02-22: qty 100

## 2017-02-22 MED ORDER — SODIUM CHLORIDE 0.9 % IV BOLUS (SEPSIS)
500.0000 mL | Freq: Once | INTRAVENOUS | Status: AC
  Administered 2017-02-22: 500 mL via INTRAVENOUS

## 2017-02-22 MED ORDER — MAGNESIUM SULFATE 2 GM/50ML IV SOLN
2.0000 g | Freq: Once | INTRAVENOUS | Status: AC
  Administered 2017-02-22: 2 g via INTRAVENOUS
  Filled 2017-02-22: qty 50

## 2017-02-22 MED ORDER — ALBUMIN HUMAN 25 % IV SOLN
12.5000 g | Freq: Four times a day (QID) | INTRAVENOUS | Status: AC
  Administered 2017-02-22 – 2017-02-23 (×4): 12.5 g via INTRAVENOUS
  Filled 2017-02-22 (×4): qty 50

## 2017-02-22 MED ORDER — SIMETHICONE 80 MG PO CHEW
80.0000 mg | CHEWABLE_TABLET | Freq: Once | ORAL | Status: DC
  Filled 2017-02-22: qty 1

## 2017-02-22 MED ORDER — PRO-STAT SUGAR FREE PO LIQD
30.0000 mL | Freq: Three times a day (TID) | ORAL | Status: DC
  Administered 2017-02-22 – 2017-03-02 (×21): 30 mL via ORAL
  Filled 2017-02-22 (×21): qty 30

## 2017-02-22 MED ORDER — LIDOCAINE HCL (PF) 1 % IJ SOLN
INTRAMUSCULAR | Status: AC
  Filled 2017-02-22: qty 30

## 2017-02-22 NOTE — Consult Note (Signed)
Cardiology Office Note:    Date:  02/22/2017   ID:  [name], DOB Apr 19, 1946, MRN [id number]  PCP:  Flossie Buffy, NP          Cardiologist:  Fransico Him, MD   Referring MD: Flossie Buffy, NP      Chief Complaint  Patient presents with  . Atrial Fibrillation  . Congestive Heart Failure    History of Present Illness:    [name] is a 71 y.o. male with a hx of PAF on Xarelto status post DCCV 09/11/86, diastolic CHF with LVEF 41-66% on echo 05/14/16, COPD with pulmonary hypertension, s/p right VATS 05/20/16 for empyema with a concern for underlying lung CA with lymph node biopsy 10/22/16 negative for CA. He saw Dr. Prescott Gum and was noted to be in A. fib and was hypotensive. His diuretics were cut back and he then developed leg edema.   [place], [place] 11/15/16 at which time he was not feeling well. He was unable to tell when he was in atrial fibrillation. He had persistent anorexia. Aldactone 12.5 mg daily was resumed.  He was readmitted to the hospital 11/17/16 with bacteremia due to Streptococcus pneumonia. Patient also had anasarca and was treated with IV Lasix. Weight decreased from 184 pounds to 170 pounds. 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   He was seen by Estella Husk, PA 12/09/2016 and weight was159.  He complained of having no appetite. His edema had improved and he was back on Lasix 40 mg once daily. He has called recently over the past few days stating that he has been gaining weight with swelling in his ankles, feet and arms.  His last albumin last month was 2.4.  He is here today with ongoing LE edema.  His wife says that his appetite has recently picked up.  He also has a rash on his left forearm that he does not know how he got.  The same left arm  has become markedly edematous over the past week and his PCP got an [place] that did not reveal any DVT.  He has continued to have SOB and LE edema that has worsened.  His diuretic regimen has been difficult to manage due to soft BP.      Past Medical History:  Diagnosis Date  . Anxiety   . Arthritis    "neck" (08/15/2015)  . Chronic bronchitis ([place])   . Chronic diastolic CHF (congestive heart failure) ([place])   . Constipation   . COPD (chronic obstructive pulmonary disease) ([place])   . DDD (degenerative disc disease), cervical   . Depression   . Dilated aortic root ([place])    26m on echo 05/2016  . Edema of left lower extremity   . Facial basal cell cancer   . H/O acne vulgaris 1960s   [facility]  . Headache    history of - left temporal- years ago- not current (08/15/2015)  . Persistent atrial fibrillation (HCC)    on coumadin with CHADS2VASC score of 2  . Pneumonia 1960s; 2015 X 2  . Prostate cancer ([facility] dx[place] early 2000s   "low spreading; non aggressive type" (08/15/2015)  . Pulmonary HTN ([place]    PASP 464mg by echo 05/2016  . Skin cancer    "  back"         Past Surgical History:  Procedure Laterality Date  . CARDIOVERSION N/A 10/11/2016   Procedure: CARDIOVERSION;  Surgeon: Sanda Klein, MD;  [facility];  Service: Cardiovascular;  Laterality: N/A;  . COLONOSCOPY    . EXCISIONAL HEMORRHOIDECTOMY  1960s  . INGUINAL HERNIA REPAIR Right 2002  . INGUINAL HERNIA REPAIR Bilateral 08/15/2015  . INGUINAL HERNIA REPAIR Bilateral 08/15/2015   Procedure: OPEN REPAIR RECURRENT RIGHT INGUINAL HERNIA WITH MESH AND REPAIR LEFT INGUINAL HERNIA WITH MESH;  Surgeon: [name], MD;  Location: [place];  Service: General;  Laterality: Bilateral;  . INSERTION OF MESH Bilateral 08/15/2015   Procedure: INSERTION OF MESH;  Surgeon: [name], MD;  Location: [place];  Service: General;  Laterality: Bilateral;  . IR  THORACENTESIS ASP PLEURAL SPACE W/IMG GUIDE  11/18/2016  . LYMPH NODE BIOPSY Bilateral 10/22/2016   Procedure: LEFT NECK LYMPH NODE BIOPSY;  Surgeon: [name], MD;  Location: [place];  Service: Thoracic;  Laterality: Bilateral;  . PROSTATE BIOPSY    . TONSILLECTOMY  1950s  . VIDEO ASSISTED THORACOSCOPY (VATS)/EMPYEMA Right 05/20/2016   Procedure: VIDEO ASSISTED THORACOSCOPY (VATS) with drainage of pleural effusion;  Surgeon: [name], MD;  Location: [place];  Service: Thoracic;  Laterality: Right;  [name] VIDEO BRONCHOSCOPY WITH ENDOBRONCHIAL ULTRASOUND Right 05/20/2016   Procedure: VIDEO BRONCHOSCOPY WITH ENDOBRONCHIAL ULTRASOUND;  Surgeon: [name], MD;  Location[facility];  Service: Thoracic;  Laterality: Right;    Current Medications: ActiveMedications      Current Meds  Medication Sig  . acetaminophen (TYLENOL) 325 MG tablet Take 2 tablets (650 mg total) by mouth every 6 (six) hours as needed for mild pain or fever.  . furosemide (LASIX) 40 MG tablet Take 0.5 tablets (20 mg total) by mouth every other day.  . polyethylene glycol (MIRALAX / GLYCOLAX) packet Take 17 g by mouth daily as needed.  . rivaroxaban (XARELTO) 20 MG TABS tablet Take 1 tablet (20 mg total) by mouth daily with supper.       Allergies:   Demerol [meperidine] and Oxycodone hcl   Social History        Social History  . Marital status: Married    Spouse name: N/A  . Number of children: N/A  . Years of education: N/A         Social History Main Topics  . Smoking status: Former Smoker    Packs/day: 1.00    Years: 62.00    Types: Cigarettes    Quit date: 05/14/2016  . Smokeless tobacco: Never Used  . Alcohol use No  . Drug use: No     Comment: CBD OIL  . Sexual activity: Not Currently    Partners: Female       Other Topics Concern  . None      Social History Narrative  . None     Family History: The patient's family history includes Alcohol abuse in his  father; Alcoholism in his father; CAD in his father; Colon cancer in his mother; Ovarian cancer in his mother and sister. There is no history of Diabetes or Stomach cancer.  ROS:   Please see the history of present illness.    ROS  All other systems reviewed and negative.   EKGs/Labs/Other Studies Reviewed:    The following studies were reviewed today: none  EKG:  EKG is not ordered today.    Recent Labs: 02/21/2017: B Natriuretic Peptide 626.5 02/22/2017: ALT  14; BUN 25; Creatinine, Ser 0.86; Hemoglobin 10.5; Magnesium 1.4; Platelets 326; Potassium 3.9; Sodium 136; TSH 2.489   Recent Lipid Panel Labs(Brief)  No results found for: CHOL, TRIG, HDL, CHOLHDL, VLDL, LDLCALC, LDLDIRECT    Physical Exam:    VS:  BP 92/60   Pulse 79   Ht _0  (1.803 m)   Wt 165 lb 12.8 oz (75.2 kg)   SpO2 96%   BMI 23.12 kg/m        Wt Readings from Last 3 Encounters:  02/22/17 161 lb (73 kg)  02/21/17 165 lb 12.8 oz (75.2 kg)  02/06/17 153 lb (69.4 kg)     GEN: thin ill appearing WM in NAD HEENT: Normal NECK: No JVD; No carotid bruits LYMPHATICS: No lymphadenopathy CARDIAC: RRR, no murmurs, rubs, gallops RESPIRATORY:  Clear to auscultation without rales, wheezing or rhonchi  ABDOMEN: Soft, non-tender, non-distended MUSCULOSKELETAL: rash over left forearm that appears to possibly be due to insect bites.  There is marked pitting edema of the Left forearm extending into the elbow.  2+ pitting edema of the legs bilaterally SKIN: Warm and dry NEUROLOGIC:  Alert and oriented x 3 PSYCHIATRIC:  Normal affect   ASSESSMENT:    1. Persistent atrial fibrillation ([place])   2. Pulmonary HTN ([place])   3. Dilated aortic root ([place])   4. Acute on chronic combined systolic and diastolic CHF (congestive heart failure) ([place])    PLAN:    In order of problems listed above:  1. Persistent atrial fibrillation - he is maintaining NSR on exam today. He will continue on Xarelto for  CHADS2VASC score of 2.  We have not been able to keep on BB due to soft BP.    2.  Acute on Chronic diastolic CHF - his weight is up 12lbs from a few weeks ago.  Unfortunately we have had difficulty in managing his diuretics due to hypotension.  I think that some of his volume overload and edema is due to hypoalbuminemia.   He has preferential swelling of his LUE with etiology unclear to me at this time. I am concerned about a DVT but venous doppler was negative on 02/10/2017.  I do not think the left arm swelling is due to CHF.  He clearly is volume overloaded with increased weight but again I think a lot of his problem is due to malnutrition and hypoalbuminea.  I am concerned that with the history of pulmonary HTN that he may have right heart failure.  At this time with a SBP of 42mHg he needs to come in for diuresis and close following of his BP and renal function. I will repeat 2D echo to see if there has been a change in LVF.  I think that he will likely need a right heart cath and I will ask advanced HF to see in the hospital.    3.  Pulmonary HTN - moderate with PASP 498mg by echo 11/2016 likely related to Group 2 pulmonary venous HTN.  He will continue on diuretics as BP tolerates. He needs a right heart cath to help manage therapy.   4.  Dilated aortic root - stable by echo at 3582m    Medication Adjustments/Labs and Tests Ordered: Current medicines are reviewed at length with the patient today.  Concerns regarding medicines are outlined above.  No orders of the defined types were placed in this encounter.  No orders of the defined types were placed in this encounter.   Signed, TraTressia Miners  Radford Pax, MD  02/22/2017 4:10 PM    [facility]    Electronically signed by [name], MD at 02/22/2017 4:11 PM

## 2017-02-22 NOTE — Progress Notes (Signed)
  Echocardiogram 2D Echocardiogram has been performed.  [name] 02/22/2017, 11:55 AM

## 2017-02-22 NOTE — Evaluation (Signed)
Physical Therapy Evaluation Patient Details Name: [name] MRN: [id number] DOB: [birth year] Today's Date: 02/22/2017   History of Present Illness  SEQUOYAH COUNTERMAN a 71 y.o.malewith medical history significant of PAF on Xarelto status post DCCV 0/8/67, diastolic CHF with LVEF 61-95% on echo 05/14/16, COPD with pulmonary hypertension, s/p right VATS 05/20/16 for empyema underlying lung CA with lymph node biopsy 10/22/16 negative for CA. Admitted for fatigue and generalized swelling.  Clinical Impression  Pt functioning at minA level, Pt with chronic overall body pain with focus on R posterior mid thoracic area making it harder to breath/take efficient breaths. Pt with dec in SPO2 with amb into 80s on RA. Pt to benefit from cardio pulmonary rehab upon d/c to improve endurance and breathing during mobility/activity. Acute PT to con't to follow..    Follow Up Recommendations Supervision/Assistance - 24 hour (cardiopulmonary rehab)    Equipment Recommendations  None recommended by PT    Recommendations for Other Services       Precautions / Restrictions Precautions Precautions: Fall Precaution Comments: monitor SpO2 Restrictions Weight Bearing Restrictions: No      Mobility  Bed Mobility Overal bed mobility: Needs Assistance Bed Mobility: Supine to Sit     Supine to sit: Min assist     General bed mobility comments: labored effort, increased time, minA for trunk elevation  Transfers Overall transfer level: Needs assistance Equipment used: 1 person hand held assist Transfers: Sit to/from Stand Sit to Stand: Min assist         General transfer comment: minA to steady pt upon standing  Ambulation/Gait Ambulation/Gait assistance: Min assist Ambulation Distance (Feet): 75 Feet Assistive device: 1 person hand held assist (pushed IV pole) Gait Pattern/deviations: Step-through pattern;Narrow base of support Gait velocity: slow Gait velocity interpretation: Below  normal speed for age/gender General Gait Details: + SOB, 4 standing rest breaks, unable to get a good reading from pulse ox, pt with reading from 80-88 on RA however poor waveform  Stairs            Wheelchair Mobility    Modified Rankin (Stroke Patients Only)       Balance                                             Pertinent Vitals/Pain Pain Assessment: 0-10 Pain Score: 6  Pain Location: all over, especially R mid thoracic Pain Descriptors / Indicators: Constant Pain Intervention(s): Monitored during session    Home Living Family/patient expects to be discharged to:: Private residence Living Arrangements: Spouse/significant other Available Help at Discharge: Family Type of Home: House Home Access: Stairs to enter Entrance Stairs-Rails: Left Entrance Stairs-Number of Steps: 5 Home Layout: One level Home Equipment: None      Prior Function Level of Independence: Independent               Hand Dominance   Dominant Hand: Right    Extremity/Trunk Assessment   Upper Extremity Assessment Upper Extremity Assessment: Generalized weakness;LUE deficits/detail LUE Deficits / Details: forearm swelling    Lower Extremity Assessment Lower Extremity Assessment: Generalized weakness    Cervical / Trunk Assessment Cervical / Trunk Assessment: Normal  Communication   Communication: No difficulties  Cognition Arousal/Alertness: Awake/alert Behavior During Therapy: WFL for tasks assessed/performed Overall Cognitive Status: Within Functional Limits for tasks assessed  General Comments General comments (skin integrity, edema, etc.): discussed at length with patient and spouse about posture. pt reports of R mid thoracic back pain    Exercises     Assessment/Plan    PT Assessment Patient needs continued PT services  PT Problem List Decreased strength;Decreased activity  tolerance;Decreased balance;Decreased mobility;Cardiopulmonary status limiting activity;Pain       PT Treatment Interventions DME instruction;Gait training;Stair training;Functional mobility training;Therapeutic activities;Therapeutic exercise    PT Goals (Current goals can be found in the Care Plan section)  Acute Rehab PT Goals Patient Stated Goal: stop the pain so i can breath better PT Goal Formulation: With patient Time For Goal Achievement: 03/01/17 Potential to Achieve Goals: Good    Frequency Min 3X/week   Barriers to discharge        Co-evaluation               AM-PAC PT "6 Clicks" Daily Activity  Outcome Measure Difficulty turning over in bed (including adjusting bedclothes, sheets and blankets)?: A Lot Difficulty moving from lying on back to sitting on the side of the bed? : A Lot Difficulty sitting down on and standing up from a chair with arms (e.g., wheelchair, bedside commode, etc,.)?: A Little Help needed moving to and from a bed to chair (including a wheelchair)?: A Little Help needed walking in hospital room?: A Little Help needed climbing 3-5 steps with a railing? : A Lot 6 Click Score: 15    End of Session Equipment Utilized During Treatment: Gait belt Activity Tolerance: Patient tolerated treatment well Patient left: in bed;with call bell/phone within reach;with family/visitor present Nurse Communication: Mobility status PT Visit Diagnosis: Difficulty in walking, not elsewhere classified (R26.2)    Time: 6004-5997 PT Time Calculation (min) (ACUTE ONLY): 38 min   Charges:   PT Evaluation $PT Eval Moderate Complexity: 1 Mod PT Treatments $Gait Training: 8-22 mins $Therapeutic Activity: 8-22 mins   PT G Codes:        Kittie Plater, PT, DPT Pager #: [phone] Office #: [phone]   [place] 02/22/2017, 3:22 PM

## 2017-02-22 NOTE — Progress Notes (Signed)
Progress Note  Patient Name: [name] Date of Encounter: 02/22/2017  Primary Cardiologist: Dr Radford Pax  Subjective   He feels slightly better today.   Inpatient Medications    Scheduled Meds: . folic acid  1 mg Oral Daily  . furosemide  20 mg Intravenous Daily  . gabapentin  100 mg Oral QHS  . multivitamin with minerals  1 tablet Oral Daily  . rivaroxaban  20 mg Oral Q supper  . simethicone  80 mg Oral Once  . thiamine  100 mg Oral Daily   Continuous Infusions: . albumin human     PRN Meds: acetaminophen **OR** acetaminophen, HYDROcodone-acetaminophen, ondansetron **OR** ondansetron (ZOFRAN) IV   Vital Signs    Vitals:   02/21/17 2124 02/21/17 2310 02/22/17 0600 02/22/17 0929  BP:  98/61 (!) 93/59 (!) 78/44  Pulse:  71 (!) 59 62  Resp:  _0 Temp:  98.1 F (36.7 C) 97.7 F (36.5 C) 97.7 F (36.5 C)  TempSrc:  Oral Oral Oral  SpO2:  91% 91% 95%  Weight: 165 lb (74.8 kg) 162 lb 6.4 oz (73.7 kg) 161 lb (73 kg)   Height: _1  (1.803 m) _2  (1.803 m)      Intake/Output Summary (Last 24 hours) at 02/22/17 1051 Last data filed at 02/22/17 0900  Gross per 24 hour  Intake              240 ml  Output              600 ml  Net             -360 ml   Filed Weights   02/21/17 2124 02/21/17 2310 02/22/17 0600  Weight: 165 lb (74.8 kg) 162 lb 6.4 oz (73.7 kg) 161 lb (73 kg)   Telemetry    A-fib - Personally Reviewed  Physical Exam   GEN: No acute distress.   Neck: + 6 cm B/L JVD Cardiac: RRR, no murmurs, rubs, or gallops.  Respiratory: Crackles bilaterally. Decreased BS at the bases. GI: Soft, nontender, non-distended  MS: No edema; No deformity. Neuro:  Nonfocal  Psych: Normal affect   Labs    Chemistry Recent Labs Lab 02/21/17 1637 02/22/17 0438  NA 137 138  K 4.3 4.0  CL 103 104  CO2 27 27  GLUCOSE 107* 88  BUN 26* 24*  CREATININE 0.95 0.95  CALCIUM 8.4* 8.1*  PROT 6.0* 5.4*  ALBUMIN 1.7* 1.5*  AST 25 22  ALT 16* 15*    ALKPHOS 215* 185*  BILITOT 0.6 0.6  GFRNONAA >60 >60  GFRAA >60 >60  ANIONGAP 7 7    Hematology Recent Labs Lab 02/21/17 1637 02/22/17 0438  WBC 8.4 8.2  RBC 4.16* 3.53*  HGB 12.7* 10.5*  HCT 38.7* 32.6*  MCV 93.0 92.4  MCH 30.5 29.7  MCHC 32.8 32.2  RDW 16.6* 16.6*  PLT 333 326    Cardiac Enzymes Recent Labs Lab 02/21/17 1637 02/21/17 2347 02/22/17 0438  TROPONINI 0.06* 0.07* 0.07*   No results for input(s): TROPIPOC in the last 168 hours.   BNP Recent Labs Lab 02/21/17 1637  BNP 626.5*    DDimer No results for input(s): DDIMER in the last 168 hours.   Radiology    Dg Chest 2 View  Result Date: 02/21/2017 CLINICAL DATA:  Left upper extremity swelling for 3 months. Worsening fatigue. Dyspnea. EXAM: CHEST  2 VIEW COMPARISON:  12/11/2016 FINDINGS: Moderate pleural effusions bilaterally, enlarged.  Diffuse interstitial thickening or fluid, worsened. Moderate cardiomegaly, unchanged. Basilar lung opacity adjacent to the pleural fluid collections, left greater than right, atelectasis versus infectious infiltrate. IMPRESSION: Enlarging bilateral effusions, left greater than right. Diffuse interstitial fluid or thickening, worsened. Electronically Signed   By: Andreas Newport M.D.   On: 02/21/2017 18:07   Cardiac Studies      Patient Profile     71 y.o. male   Assessment & Plan    1. Persistent atrial fibrillation - rate controlled  2.  Acute on chronic diastolic CHF - his weight is up 12lbs from a few weeks ago. Down 4 lbs overnight. Unfortunately we have had difficulty in managing his diuretics due to hypotension.  I think that some of his volume overload and edema is due to hypoalbuminemia.  He is getting albumin iv and also getting nutrition consult for supplements.  CHF team to see tomorrow. ? Benefit from pressors use.  Bedside echo shows large pleural effusions, I think he would benefit from thoracentesis, this was discussed with primary team.  3.   Pulmonary HTN - moderate with PASP 34mHg by echo 11/2016 likely related to Group 2 pulmonary venous HTN.  He will continue on diuretics.    4.  Dilated aortic root - stable by echo at 366m  For questions or updates, please contact [facility] consult [email or web address] for contact info under Cardiology/STEMI.      Signed, KaEna DawleyMD  02/22/2017, 10:51 AM

## 2017-02-22 NOTE — Progress Notes (Signed)
Patient and wife asking for compression stockings and the intermittent compression wraps.  Patient blood pressure recheck is 92/58.  Dr. Starla Link said ok to give lasix now.

## 2017-02-22 NOTE — Procedures (Addendum)
PROCEDURE SUMMARY:  Patient presented to radiology department for thoracentesis related to bilateral pleural effusions.  Both right and left chest were assessed with [place].  Left chest shows fluid amenable to thoracentesis.  Right chest shows thick, loculated fluid which would not be amenable to thoracentesis.   Successful US guided diagnostic and therapeutic left thoracentesis. Yielded 1.2 liters of pale yellow fluid. Pt tolerated procedure well. No immediate complications.  Specimen was sent for labs. CXR shows no pneumothorax post-procedure.   Attempted call family at both numbers listed in chart at patient request with no answer.   Docia Barrier PA-C 02/22/2017 3:46 PM

## 2017-02-22 NOTE — Progress Notes (Signed)
Patient returned from ultrasound. No complaints of pain, bandaid clean, dry, and intact.  Patient's BP 78/52.  Lasix was not given earlier as patient went to ultrasound.  Dr. Starla Link made aware of blood pressure and lasix.  Will continue to monitor.

## 2017-02-22 NOTE — Discharge Instructions (Signed)
Information on my medicine - XARELTO (Rivaroxaban)  This medication education was reviewed with me or my healthcare representative as part of my discharge preparation.  Why was Xarelto prescribed for you? Xarelto was prescribed for you to reduce the risk of a blood clot forming that can cause a stroke if you have a medical condition called atrial fibrillation (a type of irregular heartbeat).  What do you need to know about xarelto ? Take your Xarelto ONCE DAILY at the same time every day with your evening meal. If you have difficulty swallowing the tablet whole, you may crush it and mix in applesauce just prior to taking your dose.  Take Xarelto exactly as prescribed by your doctor and DO NOT stop taking Xarelto without talking to the doctor who prescribed the medication.  Stopping without other stroke prevention medication to take the place of Xarelto may increase your risk of developing a clot that causes a stroke.  Refill your prescription before you run out.  After discharge, you should have regular check-up appointments with your healthcare provider that is prescribing your Xarelto.  In the future your dose may need to be changed if your kidney function or weight changes by a significant amount.  What do you do if you miss a dose? If you are taking Xarelto ONCE DAILY and you miss a dose, take it as soon as you remember on the same day then continue your regularly scheduled once daily regimen the next day. Do not take two doses of Xarelto at the same time or on the same day.   Important Safety Information A possible side effect of Xarelto is bleeding. You should call your healthcare provider right away if you experience any of the following: ? Bleeding from an injury or your nose that does not stop. ? Unusual colored urine (red or dark brown) or unusual colored stools (red or black). ? Unusual bruising for unknown reasons. ? A serious fall or if you hit your head (even if there  is no bleeding).  Some medicines may interact with Xarelto and might increase your risk of bleeding while on Xarelto. To help avoid this, consult your healthcare provider or pharmacist prior to using any new prescription or non-prescription medications, including herbals, vitamins, non-steroidal anti-inflammatory drugs (NSAIDs) and supplements.  This website has more information on Xarelto: [email or web address]

## 2017-02-22 NOTE — Progress Notes (Signed)
Initial Nutrition Assessment  DOCUMENTATION CODES:  Non-severe (moderate) malnutrition in context of chronic illness  INTERVENTION:  Will order 30 mL Prostat TID, each supplement provides 100 kcal and 15 grams of protein.  Diet education regarding low sodium diet, high protein diet and answered miscellaneous nutrition questions  Reviewed menu options, how to order, ordered a high protein lunch  Please note, pt has had a chronically low albumin 1.7-2.2 for a year. He has had period of good intake during this period. He has not had this severe of edema for the duration  NUTRITION DIAGNOSIS:  Increased nutrient needs related to chronic illness (COPD, HF, Prostate Cancer) as evidenced by estimated nutritional requirements for these comorbidities  GOAL:  Patient will meet greater than or equal to 90% of their needs  MONITOR:  PO intake, Supplement acceptance, Labs, Weight trends, Skin  REASON FOR ASSESSMENT:  Consult Assessment of nutrition requirement/status  ASSESSMENT:  71 y/o male PMHx PAF, CHF, COPD, HTN, Prostate Cancer, Anxiety/Depression. Presented with fatigue, decreased intake and generalized swelling. Had recently had decrease in Lasix dose. At 10/12 outpatient cardiology appointment, noted to have gained 8 lbs in approximately 2 weeks and have albumin 1.7. RD consulted regarding this.   Spoke with pt, wife and daughter (entered later). Wife reports the patient's appetite in general has been poor for a while now, but notes that the past week, his intake has actually been better. He has been saying he is hungry, which is something he has not done recently. At baseline, he tries to follow a lower sodium diet. This week, his diet has been a breakfast if Eggs and cheese toast. Skips lunch. PM snack is Olives and grapes w/ cheese. Dinner is meat w/ potato. He does not drink supplements. Says ensure/boost causes diarrhea.   Pt reports UBW of 183, but notes it has been years since he  weighed this. RD reviewed his charted weight history. Was weighing 175 lbs at roughly this time last year. He was averaging 150-160 lbs this past spring. He dropped down to 139 lbs late summer. His wife confirms this and says this was due to a hospitalization for sepsis. He had regained to 153 lbs on 02/06/2017. Wife notes he had minimal edema at this time (will use as dry/dosing weight). Now, he is up to 161, has noted edema.    RD had long, but quite productive conversation with family. They state they have been told by multiple providers for a year now that the patient is malnourished. RD reviewed chart-his albumin has been 1.7-2.2 for almost 1 year-swelling has not persisted this entire time).  note his albumin and he had not had as much swelling  We discussed the nature of albumin and how it can certainly be affected by malnutrition, but can also be directly influenced by chronic diseases such as HF and prostate cancer. Improving his nutrition potentially may improve his albumin, but given his chronic low albumin, RD is sceptical it will return it to normal limits with nutrition alone.   That said, RD went over importance of adequate protein/nutrition intake with minimization of sodium. Patient notes he does not like the hospital food. RD went through menu, took food requests and ordered a lunch.   We discussed a low sodium diet outpatient. Patient is struggling with replacing salt. We discussed alternatives such as garlic/onion powder. Went over appropriate vs inappropriate food choices at home. Stressed everything in moderating.   Reviewed potential supplements. They were very much interested in  trying the liquid protein (Prosat). Will order this.   Answered miscellaneous nutrition questions. Discussed [place] salt. Patient has stopped smoking and has turned to lemon drops to quell his cravings. Patient does not have diabetes. Confirmed that choosing these over cigarettes is good.   Physical  Exam: Severe edema LUE. Moderate edema to BLU. MOderate muscle wasting of deltoids, clavicular musculature. Mild muscle wasting of temporalis, interosseous. Moderate fat wasting of thorax, orbitals. Mild fat wasting of R underarm.   Labs: Albumin: 1.5 (1.7-2.2 for nearly 1 year), Mag 1.4, BUN/creat:24/.95, Prealbumin:14.9, BNP: 627, Alk phos: 185.  Meds: Folate, Thiamin, MVI with min, Lasix, IVF, IV albumin   Recent Labs Lab 02/21/17 1637 02/22/17 0438  NA 137 138  K 4.3 4.0  CL 103 104  CO2 27 27  BUN 26* 24*  CREATININE 0.95 0.95  CALCIUM 8.4* 8.1*  MG  --  1.4*  PHOS  --  3.9  GLUCOSE 107* 88     Diet Order:  Diet Heart Room service appropriate? Yes; Fluid consistency: Thin  Skin:  Reviewed, no issues  Last BM:  10/12  Height:  Ht Readings from Last 1 Encounters:  02/21/17 5' 11" (1.803 m)   Weight:  Wt Readings from Last 1 Encounters:  02/22/17 161 lb (73 kg)   Wt Readings from Last 10 Encounters:  02/22/17 161 lb (73 kg)  02/21/17 165 lb 12.8 oz (75.2 kg)  02/06/17 153 lb (69.4 kg)  01/02/17 139 lb 11.2 oz (63.4 kg)  01/01/17 139 lb (63 kg)  12/30/16 139 lb (63 kg)  12/23/16 142 lb 6.4 oz (64.6 kg)  12/11/16 155 lb 12.8 oz (70.7 kg)  12/09/16 159 lb 6.4 oz (72.3 kg)  12/02/16 164 lb (74.4 kg)   Ideal Body Weight:  78.18 kg  BMI:  Body mass index using dry weight is 21.3 kg/m.  Used estimated dry weight of 68.4 kg as dosing Estimated Nutritional Needs:  Kcal:  1950-2150 (28-31 kcal/kg dw) Protein:  90-105g Pro (1.3-1.5 g/kg bw) Fluid:  Per md   EDUCATION NEEDS:  Education needs addressed  Burtis Junes RD, LDN, CNSC Clinical Nutrition Pager: [phone] 02/22/2017 11:48 AM

## 2017-02-22 NOTE — Progress Notes (Signed)
Patient ID: [name], male   DOB: 1946/03/15, 71 y.o.   MRN: [id number]  PROGRESS NOTE    [name]  AGT:[id number] DOB: 1945-07-01 DOA: 02/21/2017 PCP: Flossie Buffy, NP   Brief Narrative:  71 year old male with history of paroxysmal A. fib on xarelto status post DCCV on 22/48/2500, diastolic CHF with LVEF of 65-70% on echo on 118, COPD with pulmonary hypertension status post right VATS on 05/20/2016 for empyema with concern for underlying lung cancer with lymph node biopsy on 10/22/2016 negative for cancer presented with generalized swelling and fatigue. He was found to be hypotensive in the ED for which he was given IV fluids and Lasix. Cardiology was consulted  Assessment & Plan:   Active Problems:   COPD mixed type ([place])   Tobacco abuse   Acute respiratory failure with hypoxia ([facility])   Lung mass   Hypoalbuminemia due to protein-calorie malnutrition ([facility])   Hypotension   Fluid overload   Hypoxia - Probably from fluid overload from worsening CHF - Continue oxygen supplementation - CAT scan of the chest to evaluate for PE/pleural effusion/status of mediastinal lymphadenopathy  Acute on chronic diastolic heart failure - Patient has anasarca and hypoalbuminemia. We will start the patient on IV albumin. Currently hypotensive; blood pressure systolic in the 37C; will give normal saline IV bolus. If blood pressure improves, we might have to give him more Lasix. - Cardiology evaluation is pending. Follow 2-D echo. Strict input and output, daily weights  - Repeat a.m. labs including creatinine  Hypotension - Plan as above  Hypoalbuminemia due to probable severe protein calorie malnutrition - Nutrition consult. Plan as above  History of mediastinal lymphadenopathy - Status post biopsy in the past which was negative. Outpatient follow-up with oncology. - Check CAT scan of the chest  Chronic generalized pain - Continue Neurontin  History of COPD -  Stable  Hypomagnesemia - Replace. Repeat a.m. labs    DVT prophylaxis: xarelto Code Status:  Full Family Communication: discussed with wife at bedside Disposition Plan:depends on clinical outcome  Consultants: Cardiology  Procedures: Echo pending  Antimicrobials:None   Subjective: Patient seen and examined at bedside. He denies any current dizziness or chest pain. He feels weak and tired and short of breath.  Objective: Vitals:   02/21/17 2124 02/21/17 2310 02/22/17 0600 02/22/17 0929  BP:  98/61 (!) 93/59 (!) 78/44  Pulse:  71 (!) 59 62  Resp:  _0 Temp:  98.1 F (36.7 C) 97.7 F (36.5 C) 97.7 F (36.5 C)  TempSrc:  Oral Oral Oral  SpO2:  91% 91% 95%  Weight: 74.8 kg (165 lb) 73.7 kg (162 lb 6.4 oz) 73 kg (161 lb)   Height: _1  (1.803 m) _2  (1.803 m)      Intake/Output Summary (Last 24 hours) at 02/22/17 1045 Last data filed at 02/22/17 0900  Gross per 24 hour  Intake              240 ml  Output              600 ml  Net             -360 ml   Filed Weights   02/21/17 2124 02/21/17 2310 02/22/17 0600  Weight: 74.8 kg (165 lb) 73.7 kg (162 lb 6.4 oz) 73 kg (161 lb)    Examination:  General exam:Ill-looking man lying in bed currently in no distress  Respiratory system: Bilateral decreased breath sound at  bases With basilar crackles  Cardiovascular system: S1 & S2 heard,rate controlled   Gastrointestinal system: Abdomen is nondistended, soft and nontender. Normal bowel sounds heard. Extremities: No cyanosis, clubbing; Trace edema bilaterally; left upper extremity edema    Data Reviewed: I have personally reviewed following labs and imaging studies  CBC:  Recent Labs Lab 02/21/17 1637 02/22/17 0438  WBC 8.4 8.2  HGB 12.7* 10.5*  HCT 38.7* 32.6*  MCV 93.0 92.4  PLT 333 299   Basic Metabolic Panel:  Recent Labs Lab 02/21/17 1637 02/22/17 0438  NA 137 138  K 4.3 4.0  CL 103 104  CO2 27 27  GLUCOSE 107* 88  BUN 26* 24*   CREATININE 0.95 0.95  CALCIUM 8.4* 8.1*  MG  --  1.4*  PHOS  --  3.9   GFR: Estimated Creatinine Clearance: 71.5 mL/min (by C-G formula based on SCr of 0.95 mg/dL). Liver Function Tests:  Recent Labs Lab 02/21/17 1637 02/22/17 0438  AST 25 22  ALT 16* 15*  ALKPHOS 215* 185*  BILITOT 0.6 0.6  PROT 6.0* 5.4*  ALBUMIN 1.7* 1.5*   No results for input(s): LIPASE, AMYLASE in the last 168 hours. No results for input(s): AMMONIA in the last 168 hours. Coagulation Profile: No results for input(s): INR, PROTIME in the last 168 hours. Cardiac Enzymes:  Recent Labs Lab 02/21/17 1637 02/21/17 2347 02/22/17 0438  TROPONINI 0.06* 0.07* 0.07*   BNP (last 3 results) No results for input(s): PROBNP in the last 8760 hours. HbA1C: No results for input(s): HGBA1C in the last 72 hours. CBG: No results for input(s): GLUCAP in the last 168 hours. Lipid Profile: No results for input(s): CHOL, HDL, LDLCALC, TRIG, CHOLHDL, LDLDIRECT in the last 72 hours. Thyroid Function Tests:  Recent Labs  02/22/17 0438  TSH 2.489   Anemia Panel: No results for input(s): VITAMINB12, FOLATE, FERRITIN, TIBC, IRON, RETICCTPCT in the last 72 hours. Sepsis Labs: No results for input(s): PROCALCITON, LATICACIDVEN in the last 168 hours.  No results found for this or any previous visit (from the past 240 hour(s)).       Radiology Studies: Dg Chest 2 View  Result Date: 02/21/2017 CLINICAL DATA:  Left upper extremity swelling for 3 months. Worsening fatigue. Dyspnea. EXAM: CHEST  2 VIEW COMPARISON:  12/11/2016 FINDINGS: Moderate pleural effusions bilaterally, enlarged. Diffuse interstitial thickening or fluid, worsened. Moderate cardiomegaly, unchanged. Basilar lung opacity adjacent to the pleural fluid collections, left greater than right, atelectasis versus infectious infiltrate. IMPRESSION: Enlarging bilateral effusions, left greater than right. Diffuse interstitial fluid or thickening, worsened.  Electronically Signed   By: Andreas Newport M.D.   On: 02/21/2017 18:07        Scheduled Meds: . folic acid  1 mg Oral Daily  . furosemide  20 mg Intravenous Daily  . gabapentin  100 mg Oral QHS  . multivitamin with minerals  1 tablet Oral Daily  . rivaroxaban  20 mg Oral Q supper  . simethicone  80 mg Oral Once  . thiamine  100 mg Oral Daily   Continuous Infusions: . albumin human       LOS: 1 day        Aline August, MD [facility] Pager [phone]  If 7PM-7AM, please contact night-coverage [email or web address] Password [email or web address] 02/22/2017, 10:45 AM

## 2017-02-23 ENCOUNTER — Other Ambulatory Visit: Payer: Self-pay

## 2017-02-23 DIAGNOSIS — Z9889 Other specified postprocedural states: Secondary | ICD-10-CM

## 2017-02-23 DIAGNOSIS — E440 Moderate protein-calorie malnutrition: Secondary | ICD-10-CM | POA: Insufficient documentation

## 2017-02-23 DIAGNOSIS — Z720 Tobacco use: Secondary | ICD-10-CM

## 2017-02-23 DIAGNOSIS — I959 Hypotension, unspecified: Secondary | ICD-10-CM

## 2017-02-23 DIAGNOSIS — E8779 Other fluid overload: Secondary | ICD-10-CM

## 2017-02-23 DIAGNOSIS — I5033 Acute on chronic diastolic (congestive) heart failure: Secondary | ICD-10-CM

## 2017-02-23 LAB — URINALYSIS, ROUTINE W REFLEX MICROSCOPIC
BACTERIA UA: NONE SEEN
Bilirubin Urine: NEGATIVE
GLUCOSE, UA: NEGATIVE mg/dL
Hgb urine dipstick: NEGATIVE
Ketones, ur: NEGATIVE mg/dL
Leukocytes, UA: NEGATIVE
Nitrite: NEGATIVE
PH: 5 (ref 5.0–8.0)
PROTEIN: 100 mg/dL — AB
RBC / HPF: NONE SEEN RBC/hpf (ref 0–5)
SQUAMOUS EPITHELIAL / LPF: NONE SEEN
Specific Gravity, Urine: 1.009 (ref 1.005–1.030)
WBC, UA: NONE SEEN WBC/hpf (ref 0–5)

## 2017-02-23 LAB — GRAM STAIN

## 2017-02-23 LAB — CBC WITH DIFFERENTIAL/PLATELET
BASOS PCT: 1 %
Basophils Absolute: 0.1 10*3/uL (ref 0.0–0.1)
EOS ABS: 0.1 10*3/uL (ref 0.0–0.7)
Eosinophils Relative: 1 %
HEMATOCRIT: 32.7 % — AB (ref 39.0–52.0)
Hemoglobin: 10.3 g/dL — ABNORMAL LOW (ref 13.0–17.0)
Lymphocytes Relative: 30 %
Lymphs Abs: 2.4 10*3/uL (ref 0.7–4.0)
MCH: 29.2 pg (ref 26.0–34.0)
MCHC: 31.5 g/dL (ref 30.0–36.0)
MCV: 92.6 fL (ref 78.0–100.0)
MONO ABS: 1.4 10*3/uL — AB (ref 0.1–1.0)
MONOS PCT: 18 %
NEUTROS ABS: 4 10*3/uL (ref 1.7–7.7)
Neutrophils Relative %: 50 %
Platelets: 318 10*3/uL (ref 150–400)
RBC: 3.53 MIL/uL — ABNORMAL LOW (ref 4.22–5.81)
RDW: 16.3 % — AB (ref 11.5–15.5)
WBC: 8 10*3/uL (ref 4.0–10.5)

## 2017-02-23 LAB — COMPREHENSIVE METABOLIC PANEL
ALK PHOS: 179 U/L — AB (ref 38–126)
ALT: 14 U/L — ABNORMAL LOW (ref 17–63)
ANION GAP: 5 (ref 5–15)
AST: 20 U/L (ref 15–41)
Albumin: 1.6 g/dL — ABNORMAL LOW (ref 3.5–5.0)
BUN: 24 mg/dL — ABNORMAL HIGH (ref 6–20)
CO2: 27 mmol/L (ref 22–32)
Calcium: 7.9 mg/dL — ABNORMAL LOW (ref 8.9–10.3)
Chloride: 105 mmol/L (ref 101–111)
Creatinine, Ser: 0.92 mg/dL (ref 0.61–1.24)
Glucose, Bld: 89 mg/dL (ref 65–99)
Potassium: 4.2 mmol/L (ref 3.5–5.1)
SODIUM: 137 mmol/L (ref 135–145)
Total Bilirubin: 0.5 mg/dL (ref 0.3–1.2)
Total Protein: 5.3 g/dL — ABNORMAL LOW (ref 6.5–8.1)

## 2017-02-23 LAB — MAGNESIUM: Magnesium: 1.8 mg/dL (ref 1.7–2.4)

## 2017-02-23 LAB — PROTEIN / CREATININE RATIO, URINE
CREATININE, URINE: 16.78 mg/dL
PROTEIN CREATININE RATIO: 6.67 mg/mg{creat} — AB (ref 0.00–0.15)
TOTAL PROTEIN, URINE: 112 mg/dL

## 2017-02-23 MED ORDER — VANCOMYCIN HCL IN DEXTROSE 1-5 GM/200ML-% IV SOLN
1000.0000 mg | Freq: Two times a day (BID) | INTRAVENOUS | Status: DC
  Administered 2017-02-23 – 2017-02-25 (×4): 1000 mg via INTRAVENOUS
  Filled 2017-02-23 (×6): qty 200

## 2017-02-23 MED ORDER — ALBUMIN HUMAN 25 % IV SOLN
12.5000 g | Freq: Four times a day (QID) | INTRAVENOUS | Status: AC
  Administered 2017-02-23 – 2017-02-24 (×3): 12.5 g via INTRAVENOUS
  Filled 2017-02-23 (×3): qty 50

## 2017-02-23 MED ORDER — ALBUMIN HUMAN 25 % IV SOLN: 12.5000 g | Freq: Four times a day (QID) | INTRAVENOUS | Status: DC

## 2017-02-23 MED ORDER — FUROSEMIDE 10 MG/ML IJ SOLN: 40.0000 mg | Freq: Two times a day (BID) | INTRAMUSCULAR | Status: DC

## 2017-02-23 MED ORDER — SODIUM CHLORIDE 0.9% FLUSH
3.0000 mL | Freq: Two times a day (BID) | INTRAVENOUS | Status: DC
  Administered 2017-02-23 – 2017-03-02 (×11): 3 mL via INTRAVENOUS

## 2017-02-23 MED ORDER — FUROSEMIDE 10 MG/ML IJ SOLN
40.0000 mg | Freq: Two times a day (BID) | INTRAMUSCULAR | Status: DC
  Administered 2017-02-23: 40 mg via INTRAVENOUS
  Filled 2017-02-23: qty 4

## 2017-02-23 MED ORDER — MIDODRINE HCL 5 MG PO TABS
5.0000 mg | ORAL_TABLET | Freq: Three times a day (TID) | ORAL | Status: DC
  Administered 2017-02-23 – 2017-02-26 (×10): 5 mg via ORAL
  Filled 2017-02-23 (×10): qty 1

## 2017-02-23 MED ORDER — LIDOCAINE 5 % EX PTCH
1.0000 | MEDICATED_PATCH | CUTANEOUS | Status: DC
  Administered 2017-02-23 – 2017-03-02 (×3): 1 via TRANSDERMAL
  Filled 2017-02-23 (×5): qty 1

## 2017-02-23 MED ORDER — SODIUM CHLORIDE 0.9 % IV SOLN: 250.0000 mL | INTRAVENOUS | Status: DC

## 2017-02-23 MED ORDER — FUROSEMIDE 10 MG/ML IJ SOLN
20.0000 mg | Freq: Once | INTRAMUSCULAR | Status: AC
  Administered 2017-02-23: 20 mg via INTRAVENOUS
  Filled 2017-02-23: qty 2

## 2017-02-23 MED ORDER — SODIUM CHLORIDE 0.9% FLUSH: 3.0000 mL | INTRAVENOUS | Status: DC | PRN

## 2017-02-23 NOTE — Progress Notes (Signed)
Pharmacy Antibiotic Note  [name] is a 71 y.o. male admitted on 02/21/2017 with generalized swelling and fatigue. The patient was noted to have a recent R-VATS in Jan'18 for empyema. This admission the patient had B/L pleural effusions now s/p thoracentesis by IR on 10/13 with 1.2L of fluid removed. The fluid cultures are now growing gram positive cocci and pharmacy has been consulted to start Vancomycin for empiric coverage. SCr 0.92, CrCl~70-80 ml/min.   Plan: 1. Start Vancomycin 1g IV every 12 hours 2. Will continue to follow renal function, culture results, LOT, and antibiotic de-escalation plans   Height: 5' 11" (180.3 cm) Weight: 161 lb 9.6 oz (73.3 kg) IBW/kg (Calculated) : 75.3  Temp (24hrs), Avg:97.9 F (36.6 C), Min:97.7 F (36.5 C), Max:98.3 F (36.8 C)   Recent Labs Lab 02/21/17 1637 02/22/17 0438 02/22/17 1217 02/23/17 0445  WBC 8.4 8.2  --  8.0  CREATININE 0.95 0.95 0.86 0.92    Estimated Creatinine Clearance: 74.1 mL/min (by C-G formula based on SCr of 0.92 mg/dL).    Allergies  Allergen Reactions  . Demerol [Meperidine] Other (See Comments)    UNSPECIFIED REACTION  Causes system to shutdown. ?   . Oxycodone Hcl Other (See Comments)    Pt states this medication 'wires him up' and makes pt hyper; pt does not want to take this ever again    Antimicrobials this admission: Vancomycin 10/14 >>  Dose adjustments this admission: n/a  Microbiology results: 10/13 L-pleural fluid >> GPC  Thank you for allowing pharmacy to be a part of this patient's care.  Lawson Radar 02/23/2017 7:06 PM

## 2017-02-23 NOTE — Progress Notes (Signed)
Patient ambulated in the hall with minimal assistance.  Patient's oxygen saturation down to 75% on room air.  While walking with 2L oxygen 90-93%.

## 2017-02-23 NOTE — Progress Notes (Addendum)
Patient ID: [name], male   DOB: 12-22-1945, 71 y.o.   MRN: [id number]  PROGRESS NOTE    [name]  IFO:[id number] DOB: 1945/09/24 DOA: 02/21/2017 PCP: Flossie Buffy, NP   Brief Narrative:  71 year old male with history of paroxysmal A. fib on xarelto status post DCCV on 67/67/2094, diastolic CHF with LVEF of 65-70% on echo on 118, COPD with pulmonary hypertension status post right VATS on 05/20/2016 for empyema with concern for underlying lung cancer with lymph node biopsy on 10/22/2016 negative for cancer presented with generalized swelling and fatigue. He was found to be hypotensive in the ED for which he was given IV fluids and Lasix. Cardiology was consulted.  Assessment & Plan:   Active Problems:   COPD mixed type ([place])   Tobacco abuse   Acute respiratory failure with hypoxia (HCC)   Lung mass   Hypoalbuminemia   Hypotension   Fluid overload   Elevated brain natriuretic peptide (BNP) level   Malnutrition of moderate degree   Acute on chronic diastolic CHF (congestive heart failure) (HCC)   Hypoxia - Probably from fluid overload from worsening CHF - Continue oxygen supplementation - CAT scan of the chest was negative for PE  Acute on chronic diastolic heart failure - cardiology following. Patient has anasarca and hypoalbuminemia. Patient received a few doses of IV albumin. Patient is being started on midodrine along with the Lasix as per cardiology recommendations - echo showed ejection fraction of 60-65%. Strict input and output, daily weights. Negative fluid balance of 740 mL since admission - Repeat a.m. labs including creatinine  Bilateral pleural effusion with moderate left-sided pleural effusion - status post thoracentesis and removal of 1.2 L fluid on 02/22/2017.  Hypotension - monitor blood pressure. Midodrine being started today   Hypoalbuminemia due to probable moderate protein calorie malnutrition - follow nutrition recommendations.  Patient received a few doses of IV albumin  History of mediastinal lymphadenopathy - Status post biopsy in the past which was negative. Outpatient follow-up with oncology. - CAT scan done yesterday did not show significant mediastinal lymphadenopathy  Chronic generalized pain - patient and family state that they don't use Neurontin currently. We will start Lidoderm patch. PT/OT eval. Patient had a PET scan done in August 2018 which did not show any evidence of skeletal metastases to account for his chronic upper and lower back pain.  History of COPD - Stable  Hypomagnesemia - improved  Generalized deconditioning - PT/OT eval. Care management consult   DVT prophylaxis: xarelto Code Status:  Full Family Communication: discussed with wife at bedside Disposition Plan:depends on clinical outcome  Consultants: Cardiology  Procedures:  Study Conclusions  - Left ventricle: The cavity size was normal. There was moderate   concentric hypertrophy. Systolic function was normal. The   estimated ejection fraction was in the range of 60% to 65%. Wall   motion was normal; there were no regional wall motion   abnormalities. The study was not technically sufficient to allow   evaluation of LV diastolic dysfunction due to atrial   fibrillation. - Aortic valve: Trileaflet; normal thickness, mildly calcified   leaflets. There was trivial regurgitation. - Mitral valve: Calcified annulus. There was mild regurgitation. - Left atrium: The atrium was mildly dilated. - Right ventricle: The cavity size was moderately dilated. Wall   thickness was normal. - Right atrium: The atrium was massively dilated. - Tricuspid valve: There was moderate regurgitation. - Pulmonic valve: There was trivial regurgitation. - Pulmonary arteries: PA peak  pressure: 51 mm Hg (S).  Left-sided thoracentesis and removal of 1.2 L fluid on 02/22/2017  Antimicrobials:None   Subjective: Patient seen and examined at  bedside.he feels slightly better than yesterday. He states that he is appetite has significantly improved. He is still short of breath with exertion. He denies any current dizziness or chest pain. Overnight fever, nausea or vomiting  Objective: Vitals:   02/22/17 1948 02/22/17 2326 02/23/17 0503 02/23/17 0832  BP: 95/60 (!) 104/58 (!) 99/57 (!) 102/58  Pulse: 77 73 77   Resp: _0 Temp: 98.3 F (36.8 C) 97.7 F (36.5 C) 97.8 F (36.6 C)   TempSrc: Oral Oral Oral   SpO2: 97% 99% 96%   Weight:   73.3 kg (161 lb 9.6 oz)   Height:        Intake/Output Summary (Last 24 hours) at 02/23/17 1044 Last data filed at 02/23/17 0504  Gross per 24 hour  Intake              270 ml  Output              650 ml  Net             -380 ml   Filed Weights   02/21/17 2310 02/22/17 0600 02/23/17 0503  Weight: 73.7 kg (162 lb 6.4 oz) 73 kg (161 lb) 73.3 kg (161 lb 9.6 oz)    Examination:  General exam:Ill-looking man lying in bed currently in no distress  Respiratory system: Bilateral decreased breath sound at bases With basilar crackles  Cardiovascular system: S1 & S2 heard,rate controlled   Gastrointestinal system: Abdomen is nondistended, soft and nontender. Normal bowel sounds heard. Extremities: No cyanosis, clubbing; Trace edema bilaterally; left upper extremity edema    Data Reviewed: I have personally reviewed following labs and imaging studies  CBC:  Recent Labs Lab 02/21/17 1637 02/22/17 0438 02/23/17 0445  WBC 8.4 8.2 8.0  NEUTROABS  --   --  4.0  HGB 12.7* 10.5* 10.3*  HCT 38.7* 32.6* 32.7*  MCV 93.0 92.4 92.6  PLT 333 326 828   Basic Metabolic Panel:  Recent Labs Lab 02/21/17 1637 02/22/17 0438 02/22/17 1217 02/23/17 0445  NA 137 138 136 137  K 4.3 4.0 3.9 4.2  CL 103 104 103 105  CO2 _1 GLUCOSE 107* 88 88 89  BUN 26* 24* 25* 24*  CREATININE 0.95 0.95 0.86 0.92  CALCIUM 8.4* 8.1* 7.9* 7.9*  MG  --  1.4*  --  1.8  PHOS  --  3.9  --   --     GFR: Estimated Creatinine Clearance: 74.1 mL/min (by C-G formula based on SCr of 0.92 mg/dL). Liver Function Tests:  Recent Labs Lab 02/21/17 1637 02/22/17 0438 02/22/17 1217 02/23/17 0445  AST _2 ALT 16* 15* 14* 14*  ALKPHOS 215* 185* 183* 179*  BILITOT 0.6 0.6 0.4 0.5  PROT 6.0* 5.4* 5.5* 5.3*  ALBUMIN 1.7* 1.5* 1.6* 1.6*   No results for input(s): LIPASE, AMYLASE in the last 168 hours. No results for input(s): AMMONIA in the last 168 hours. Coagulation Profile: No results for input(s): INR, PROTIME in the last 168 hours. Cardiac Enzymes:  Recent Labs Lab 02/21/17 1637 02/21/17 2347 02/22/17 0438 02/22/17 1217  TROPONINI 0.06* 0.07* 0.07* 0.06*   BNP (last 3 results) No results for input(s): PROBNP in the last 8760 hours. HbA1C: No results for input(s): HGBA1C in the last  72 hours. CBG: No results for input(s): GLUCAP in the last 168 hours. Lipid Profile: No results for input(s): CHOL, HDL, LDLCALC, TRIG, CHOLHDL, LDLDIRECT in the last 72 hours. Thyroid Function Tests:  Recent Labs  02/22/17 0438  TSH 2.489   Anemia Panel: No results for input(s): VITAMINB12, FOLATE, FERRITIN, TIBC, IRON, RETICCTPCT in the last 72 hours. Sepsis Labs: No results for input(s): PROCALCITON, LATICACIDVEN in the last 168 hours.  Recent Results (from the past 240 hour(s))  Gram stain     Status: None   Collection Time: 02/22/17  3:36 PM  Result Value Ref Range Status   Specimen Description PLEURAL LEFT  Final   Special Requests NONE  Final   Gram Stain   Final    CYTOSPIN SMEAR WBC PRESENT,BOTH PMN AND MONONUCLEAR NO ORGANISMS SEEN    Report Status 02/23/2017 FINAL  Final         Radiology Studies: Dg Chest 1 View  Result Date: 02/22/2017 CLINICAL DATA:  Status post LEFT thoracentesis EXAM: CHEST 1 VIEW COMPARISON:  Chest radiograph 02/21/2017 FINDINGS: Interval reduction of LEFT pleural fluid volume. Small effusion remains on the LEFT. No LEFT  pneumothorax. Small loculated RIGHT pleural effusion additionally. IMPRESSION: No pneumothorax following LEFT thoracentesis. Reduction in pleural fluid on the LEFT. Electronically Signed   By: Suzy Bouchard M.D.   On: 02/22/2017 16:10   Dg Chest 2 View  Result Date: 02/21/2017 CLINICAL DATA:  Left upper extremity swelling for 3 months. Worsening fatigue. Dyspnea. EXAM: CHEST  2 VIEW COMPARISON:  12/11/2016 FINDINGS: Moderate pleural effusions bilaterally, enlarged. Diffuse interstitial thickening or fluid, worsened. Moderate cardiomegaly, unchanged. Basilar lung opacity adjacent to the pleural fluid collections, left greater than right, atelectasis versus infectious infiltrate. IMPRESSION: Enlarging bilateral effusions, left greater than right. Diffuse interstitial fluid or thickening, worsened. Electronically Signed   By: Andreas Newport M.D.   On: 02/21/2017 18:07   Ct Angio Chest Pe W Or Wo Contrast  Result Date: 02/22/2017 CLINICAL DATA:  Shortness of breath. Bilateral pleural effusions. History of prostate carcinoma. EXAM: CT ANGIOGRAPHY CHEST WITH CONTRAST TECHNIQUE: Multidetector CT imaging of the chest was performed using the standard protocol during bolus administration of intravenous contrast. Multiplanar CT image reconstructions and MIPs were obtained to evaluate the vascular anatomy. CONTRAST:  100 mL Isovue 370 IV COMPARISON:  CT of the chest without contrast on 11/18/2016 and PET scan on 12/12/2016 FINDINGS: Cardiovascular: The pulmonary arteries are densely opacified with contrast. There is no evidence of pulmonary embolism. No pericardial fluid. There is some reflux of contrast into the IVC and hepatic veins consistent with right heart failure. Mediastinum/Nodes: No enlarged mediastinal, hilar, or axillary lymph nodes. Thyroid gland, trachea, and esophagus demonstrate no significant findings. Lungs/Pleura: Bilateral pleural effusions again noted. A smaller right pleural effusion  appears stable compared to prior imaging. There is some enlargement of left pleural fluid volume which is now moderate. Lungs show evidence of stable emphysema and probable congestive heart failure. No focal airspace consolidation or pneumothorax identified. Upper Abdomen: No acute abnormality. Musculoskeletal: No chest wall abnormality. No acute or significant osseous findings. Review of the MIP images confirms the above findings. IMPRESSION: 1. No evidence of acute pulmonary embolism. 2. Evidence of congestive heart failure and right heart failure. Chronic bilateral pleural effusions again noted with some enlargement of the left pleural effusion compared to prior studies. There is a smaller right pleural effusion present which appears relatively stable in volume. 3. Stable emphysematous lung disease. Emphysema (ICD10-J43.9). Electronically Signed  By: Aletta Edouard M.D.   On: 02/22/2017 14:09   US Thoracentesis Asp Pleural Space W/img Guide  Result Date: 02/23/2017 INDICATION: Hit with history of CHF and anasarca, now with pleural effusion. Request is made for diagnostic and therapeutic thoracentesis on the left. EXAM: ULTRASOUND GUIDED DIAGNOSTIC AND THERAPEUTIC LEFT THORACENTESIS MEDICATIONS: 10 mL 1% lidocaine COMPLICATIONS: None immediate. PROCEDURE: An ultrasound guided thoracentesis was thoroughly discussed with the patient and questions answered. The benefits, risks, alternatives and complications were also discussed. The patient understands and wishes to proceed with the procedure. Written consent was obtained. Ultrasound was performed to localize and mark an adequate pocket of fluid in the left chest. The area was then prepped and draped in the normal sterile fashion. 1% Lidocaine was used for local anesthesia. Under ultrasound guidance a Safe-T-Centesis catheter was introduced. Thoracentesis was performed. The catheter was removed and a dressing applied. FINDINGS: A total of approximately 1.2  liters of pale yellow fluid was removed. Samples were sent to the laboratory as requested by the clinical team. Limited ultrasound of the chest on the right shows loculated pleural fluid. Note patient did undergo a right VATS procedure on 05/20/2016. IMPRESSION: Successful ultrasound guided diagnostic and therapeutic left thoracentesis yielding 1.2 liters of pleural fluid. Electronically Signed   By: Aletta Edouard M.D.   On: 02/22/2017 16:16        Scheduled Meds: . feeding supplement (PRO-STAT SUGAR FREE 64)  30 mL Oral TID BM  . folic acid  1 mg Oral Daily  . furosemide  40 mg Intravenous BID  . gabapentin  100 mg Oral QHS  . midodrine  5 mg Oral TID WC  . multivitamin with minerals  1 tablet Oral Daily  . rivaroxaban  20 mg Oral Q supper  . simethicone  80 mg Oral Once  . sodium chloride flush  3 mL Intravenous Q12H  . thiamine  100 mg Oral Daily   Continuous Infusions: . sodium chloride       LOS: 2 days        Aline August, MD [facility] Pager [phone]  If 7PM-7AM, please contact night-coverage [email or web address] Password [email or web address] 02/23/2017, 10:44 AM

## 2017-02-23 NOTE — Progress Notes (Signed)
Received critical value of gram positive cocci in clusters in pleural fluid.  Dr. Starla Link paged.

## 2017-02-23 NOTE — Evaluation (Signed)
Occupational Therapy Evaluation Patient Details Name: [name] MRN: [id number] DOB: April 07, 1946 Today's Date: 02/23/2017    History of Present Illness [name] a 71 y.o.malewith medical history significant of PAF on Xarelto status post DCCV 05/14/22, diastolic CHF with LVEF 82-50% on echo 05/14/16, COPD with pulmonary hypertension, s/p right VATS 05/20/16 for empyema underlying lung CA with lymph node biopsy 10/22/16 negative for CA. Admitted for fatigue and generalized swelling.   Clinical Impression   PTA Pt modified independent in ADL and mobility, however Pt reports extreme fatigue and loss of meaningful occupations recently due to health problems. Pt is generally min guard assist for ADL and sit to stand. Pt will benefit from skilled OT in the acute setting and afterwards at [facility] level to maximize safety and independence in ADL and functional transfers as well as other meaningful occupations to Pt (Pt talked at length about wanting to play piano again, missing washing his car, helping his wife with housework). Energy conservation handout provided and talked with Pt and wife about thinking about energy distribution throughout the day and doing small activities more frequently instead of exhausting himself right at the beginning of the day. Edema education provided in elevation and movement assisting.    Follow Up Recommendations  Home health OT;Supervision/Assistance - 24 hour    Equipment Recommendations  Tub/shower seat    Recommendations for Other Services       Precautions / Restrictions Precautions Precautions: Fall Precaution Comments: monitor SpO2 Restrictions Weight Bearing Restrictions: No      Mobility Bed Mobility Overal bed mobility: Needs Assistance Bed Mobility: Supine to Sit     Supine to sit: Min assist     General bed mobility comments: labored effort, increased time, minA for trunk elevation  Transfers Overall transfer level: Needs  assistance Equipment used: 1 person hand held assist Transfers: Sit to/from Stand Sit to Stand: Min assist         General transfer comment: minA to steady pt upon standing    Balance Overall balance assessment: Needs assistance Sitting-balance support: Single extremity supported;Feet supported Sitting balance-Leahy Scale: Fair Sitting balance - Comments: sitting EOB with no back support   Standing balance support: No upper extremity supported;During functional activity Standing balance-Leahy Scale: Poor Standing balance comment: static standing for urination                           ADL either performed or assessed with clinical judgement   ADL Overall ADL's : Needs assistance/impaired Eating/Feeding: Modified independent;Sitting   Grooming: Set up;Bed level;Sitting;Wash/dry hands;Wash/dry face   Upper Body Bathing: Minimal assistance   Lower Body Bathing: Minimal assistance   Upper Body Dressing : Set up   Lower Body Dressing: Minimal assistance   Toilet Transfer: Min guard (sit to stand for use of urinal) Toilet Transfer Details (indicate cue type and reason): Pt stood from EOB to use urinal Toileting- Clothing Manipulation and Hygiene: Set up       Functional mobility during ADLs:  (not assessed this session, sit to stand only) General ADL Comments: Pt is very limited by pain and fatigue     Vision Baseline Vision/History: Wears glasses Wears Glasses: At all times Patient Visual Report: No change from baseline       Perception     Praxis      Pertinent Vitals/Pain Pain Assessment: 0-10 Pain Score: 6  Pain Location: all over, especially R mid thoracic Pain Descriptors /  Indicators: Constant Pain Intervention(s): Monitored during session;Repositioned     Hand Dominance Right   Extremity/Trunk Assessment Upper Extremity Assessment Upper Extremity Assessment: Generalized weakness;LUE deficits/detail LUE Deficits / Details: forearm  swelling   Lower Extremity Assessment Lower Extremity Assessment: Generalized weakness   Cervical / Trunk Assessment Cervical / Trunk Assessment: Kyphotic   Communication Communication Communication: No difficulties   Cognition Arousal/Alertness: Awake/alert Behavior During Therapy: WFL for tasks assessed/performed Overall Cognitive Status: Within Functional Limits for tasks assessed                                     General Comments  Spent significant time with Pt and wife talking about energy conservation and picking meaningful activities that Pt would like to do (like playing the piano, assisting with house cleaning, etc and energy strategies to allow him to work towards those). Wife in room, very receptive and realistic about what goals can be.     Exercises     Shoulder Instructions      Home Living Family/patient expects to be discharged to:: Private residence Living Arrangements: Spouse/significant other Available Help at Discharge: Family Type of Home: House Home Access: Stairs to enter Technical brewer of Steps: 5 Entrance Stairs-Rails: Left Home Layout: One level     Bathroom Shower/Tub: Corporate investment banker: [place]: None          Prior Functioning/Environment Level of Independence: Needs assistance    ADL's / Homemaking Assistance Needed: Pt is supervision for ADL and also requires increased time and effort (recently)   Comments: Optometrist and works frequently        OT Problem List: Decreased strength;Decreased activity tolerance;Impaired balance (sitting and/or standing);Decreased safety awareness;Decreased knowledge of use of DME or AE;Pain;Increased edema      OT Treatment/Interventions: Self-care/ADL training;Therapeutic exercise;Energy conservation;DME and/or AE instruction;Therapeutic activities;Patient/family education;Balance training    OT Goals(Current goals can be  found in the care plan section) Acute Rehab OT Goals Patient Stated Goal: start participating in meaningful activities (playing piano again) OT Goal Formulation: With patient/family Time For Goal Achievement: 03/09/17 Potential to Achieve Goals: Fair ADL Goals Pt Will Perform Grooming: standing;with supervision (taking sitting rest breaks as needed) Pt Will Transfer to Toilet: with supervision;ambulating Pt Will Perform Toileting - Clothing Manipulation and hygiene: with modified independence;sit to/from stand Additional ADL Goal #1: Pt will recall and implement 3 energy conservation strategies to assist in ADL activity  with less than 2 verbalcues  OT Frequency: Min 2X/week   Barriers to D/C:            Co-evaluation              AM-PAC PT "6 Clicks" Daily Activity     Outcome Measure Help from another person eating meals?: None Help from another person taking care of personal grooming?: None Help from another person toileting, which includes using toliet, bedpan, or urinal?: A Little Help from another person bathing (including washing, rinsing, drying)?: A Little Help from another person to put on and taking off regular upper body clothing?: A Little Help from another person to put on and taking off regular lower body clothing?: A Lot 6 Click Score: 19   End of Session Equipment Utilized During Treatment: Oxygen Nurse Communication: Mobility status  Activity Tolerance: Patient limited by fatigue;Patient limited by pain Patient left: in bed;with call bell/phone within  reach;with family/visitor present  OT Visit Diagnosis: Unsteadiness on feet (R26.81);History of falling (Z91.81);Adult, failure to thrive (R62.7);Pain;Muscle weakness (generalized) (M62.81) Pain - Right/Left: Right (generalized)                Time: 1025-8527 OT Time Calculation (min): 55 min Charges:  OT General Charges $OT Visit: 1 Visit OT Evaluation $OT Eval Moderate Complexity: 1 Mod OT  Treatments $Self Care/Home Management : 38-52 mins G-Codes:     Hulda Humphrey OTR/L [place] 02/23/2017, 6:05 PM

## 2017-02-23 NOTE — Progress Notes (Signed)
Progress Note  Patient Name: [name] Date of Encounter: 02/23/2017  Primary Cardiologist: Dr Radford Pax  Subjective   He feels tired, no significant SOB.  Inpatient Medications    Scheduled Meds: . feeding supplement (PRO-STAT SUGAR FREE 64)  30 mL Oral TID BM  . folic acid  1 mg Oral Daily  . furosemide  20 mg Intravenous Daily  . gabapentin  100 mg Oral QHS  . multivitamin with minerals  1 tablet Oral Daily  . rivaroxaban  20 mg Oral Q supper  . simethicone  80 mg Oral Once  . thiamine  100 mg Oral Daily   Continuous Infusions:  PRN Meds: acetaminophen **OR** acetaminophen, HYDROcodone-acetaminophen, ondansetron **OR** ondansetron (ZOFRAN) IV   Vital Signs    Vitals:   02/22/17 1948 02/22/17 2326 02/23/17 0503 02/23/17 0832  BP: 95/60 (!) 104/58 (!) 99/57 (!) 102/58  Pulse: 77 73 77   Resp: _0 Temp: 98.3 F (36.8 C) 97.7 F (36.5 C) 97.8 F (36.6 C)   TempSrc: Oral Oral Oral   SpO2: 97% 99% 96%   Weight:   161 lb 9.6 oz (73.3 kg)   Height:        Intake/Output Summary (Last 24 hours) at 02/23/17 0949 Last data filed at 02/23/17 0504  Gross per 24 hour  Intake              270 ml  Output              650 ml  Net             -380 ml   Filed Weights   02/21/17 2310 02/22/17 0600 02/23/17 0503  Weight: 162 lb 6.4 oz (73.7 kg) 161 lb (73 kg) 161 lb 9.6 oz (73.3 kg)   Telemetry    A-fib - Personally Reviewed  Physical Exam   GEN: No acute distress.   Neck: No JVD Cardiac: RRR, no murmurs, rubs, or gallops.  Respiratory: clear bilaterally. Decreased BS at the right base. GI: Soft, nontender, non-distended  MS: No edema; No deformity. Neuro:  Nonfocal  Psych: Normal affect   Labs    Chemistry  Recent Labs Lab 02/22/17 0438 02/22/17 1217 02/23/17 0445  NA 138 136 137  K 4.0 3.9 4.2  CL 104 103 105  CO2 _1 GLUCOSE 88 88 89  BUN 24* 25* 24*  CREATININE 0.95 0.86 0.92  CALCIUM 8.1* 7.9* 7.9*  PROT 5.4* 5.5* 5.3*    ALBUMIN 1.5* 1.6* 1.6*  AST _2 ALT 15* 14* 14*  ALKPHOS 185* 183* 179*  BILITOT 0.6 0.4 0.5  GFRNONAA >60 >60 >60  GFRAA >60 >60 >60  ANIONGAP _3 Hematology  Recent Labs Lab 02/21/17 1637 02/22/17 0438 02/23/17 0445  WBC 8.4 8.2 8.0  RBC 4.16* 3.53* 3.53*  HGB 12.7* 10.5* 10.3*  HCT 38.7* 32.6* 32.7*  MCV 93.0 92.4 92.6  MCH 30.5 29.7 29.2  MCHC 32.8 32.2 31.5  RDW 16.6* 16.6* 16.3*  PLT 333 326 318    Cardiac Enzymes  Recent Labs Lab 02/21/17 1637 02/21/17 2347 02/22/17 0438 02/22/17 1217  TROPONINI 0.06* 0.07* 0.07* 0.06*   No results for input(s): TROPIPOC in the last 168 hours.   BNP  Recent Labs Lab 02/21/17 1637  BNP 626.5*    DDimer No results for input(s): DDIMER in the last 168 hours.   Radiology    Dg Chest  1 View  Result Date: 02/22/2017 CLINICAL DATA:  Status post LEFT thoracentesis EXAM: CHEST 1 VIEW COMPARISON:  Chest radiograph 02/21/2017 FINDINGS: Interval reduction of LEFT pleural fluid volume. Small effusion remains on the LEFT. No LEFT pneumothorax. Small loculated RIGHT pleural effusion additionally. IMPRESSION: No pneumothorax following LEFT thoracentesis. Reduction in pleural fluid on the LEFT. Electronically Signed   By: Suzy Bouchard M.D.   On: 02/22/2017 16:10   Dg Chest 2 View  Result Date: 02/21/2017 CLINICAL DATA:  Left upper extremity swelling for 3 months. Worsening fatigue. Dyspnea. EXAM: CHEST  2 VIEW COMPARISON:  12/11/2016 FINDINGS: Moderate pleural effusions bilaterally, enlarged. Diffuse interstitial thickening or fluid, worsened. Moderate cardiomegaly, unchanged. Basilar lung opacity adjacent to the pleural fluid collections, left greater than right, atelectasis versus infectious infiltrate. IMPRESSION: Enlarging bilateral effusions, left greater than right. Diffuse interstitial fluid or thickening, worsened. Electronically Signed   By: Andreas Newport M.D.   On: 02/21/2017 18:07   Ct Angio Chest Pe W  Or Wo Contrast  Result Date: 02/22/2017 CLINICAL DATA:  Shortness of breath. Bilateral pleural effusions. History of prostate carcinoma. EXAM: CT ANGIOGRAPHY CHEST WITH CONTRAST TECHNIQUE: Multidetector CT imaging of the chest was performed using the standard protocol during bolus administration of intravenous contrast. Multiplanar CT image reconstructions and MIPs were obtained to evaluate the vascular anatomy. CONTRAST:  100 mL Isovue 370 IV COMPARISON:  CT of the chest without contrast on 11/18/2016 and PET scan on 12/12/2016 FINDINGS: Cardiovascular: The pulmonary arteries are densely opacified with contrast. There is no evidence of pulmonary embolism. No pericardial fluid. There is some reflux of contrast into the IVC and hepatic veins consistent with right heart failure. Mediastinum/Nodes: No enlarged mediastinal, hilar, or axillary lymph nodes. Thyroid gland, trachea, and esophagus demonstrate no significant findings. Lungs/Pleura: Bilateral pleural effusions again noted. A smaller right pleural effusion appears stable compared to prior imaging. There is some enlargement of left pleural fluid volume which is now moderate. Lungs show evidence of stable emphysema and probable congestive heart failure. No focal airspace consolidation or pneumothorax identified. Upper Abdomen: No acute abnormality. Musculoskeletal: No chest wall abnormality. No acute or significant osseous findings. Review of the MIP images confirms the above findings. IMPRESSION: 1. No evidence of acute pulmonary embolism. 2. Evidence of congestive heart failure and right heart failure. Chronic bilateral pleural effusions again noted with some enlargement of the left pleural effusion compared to prior studies. There is a smaller right pleural effusion present which appears relatively stable in volume. 3. Stable emphysematous lung disease. Emphysema (ICD10-J43.9). Electronically Signed   By: Aletta Edouard M.D.   On: 02/22/2017 14:09   US  Thoracentesis Asp Pleural Space W/img Guide  Result Date: 02/23/2017 INDICATION: Hit with history of CHF and anasarca, now with pleural effusion. Request is made for diagnostic and therapeutic thoracentesis on the left. EXAM: ULTRASOUND GUIDED DIAGNOSTIC AND THERAPEUTIC LEFT THORACENTESIS MEDICATIONS: 10 mL 1% lidocaine COMPLICATIONS: None immediate. PROCEDURE: An ultrasound guided thoracentesis was thoroughly discussed with the patient and questions answered. The benefits, risks, alternatives and complications were also discussed. The patient understands and wishes to proceed with the procedure. Written consent was obtained. Ultrasound was performed to localize and mark an adequate pocket of fluid in the left chest. The area was then prepped and draped in the normal sterile fashion. 1% Lidocaine was used for local anesthesia. Under ultrasound guidance a Safe-T-Centesis catheter was introduced. Thoracentesis was performed. The catheter was removed and a dressing applied. FINDINGS: A total of approximately 1.2  liters of pale yellow fluid was removed. Samples were sent to the laboratory as requested by the clinical team. Limited ultrasound of the chest on the right shows loculated pleural fluid. Note patient did undergo a right VATS procedure on 05/20/2016. IMPRESSION: Successful ultrasound guided diagnostic and therapeutic left thoracentesis yielding 1.2 liters of pleural fluid. Electronically Signed   By: Aletta Edouard M.D.   On: 02/22/2017 16:16   Cardiac Studies      Patient Profile     71 y.o. male   Assessment & Plan    1. Persistent atrial fibrillation - rate controlled, if he remains in a-fib, we will plan for DCCV on Tuesday.   2.  Acute on chronic diastolic CHF - his weight is up 12lbs from a few weeks ago. Down 4 lbs overnight, another pound since yesterday, also s/o left sided thoracentesis yesterday, negative 1.2 L, right - unable to tap. I think that some of his volume overload and  edema is due to hypoalbuminemia.  He is getting albumin iv and also getting nutrition consult for supplements.  CHF team to see tomorrow. ? Benefit from pressors use.  Bedside echo shows large pleural effusions, I think he would benefit from thoracentesis, this was discussed with primary team. He needs tremendous physical therapy, he is severely deconditioned and complains of fatigue, we will schedule PT&OT here but will need to continue at home.  3.  Pulmonary HTN - moderate with PASP 26mHg by echo 11/2016 likely related to Group 2 pulmonary venous HTN.  He will continue on diuretics.    4.  Dilated aortic root - stable by echo at 360m  For questions or updates, please contact [facility] consult [email or web address] for contact info under Cardiology/STEMI.      Signed, KaEna DawleyMD  02/23/2017, 9:49 AM

## 2017-02-23 NOTE — Progress Notes (Signed)
Patient ID: [name], male   DOB: 12/04/1945, 71 y.o.   MRN: [id number]     Advanced Heart Failure Rounding Note  Primary Cardiologist: Radford Pax HF: (new) [name]  Subjective:    Patient was admitted with fatigue and edema. Difficult to diurese due to low BP.  Marked hypoalbuminemia with poor po intake x months.    Appetite better in the hospital.  Had left thoracentesis on 10/13 with 1.2 L out, looks like transudate.   This morning, rhythm appears to be atrial fibrillation but difficult.  He has not diuresed much.  SBP now 90s-100s.   Echo: EF 60-65%, mild MR, moderately dilated RV, PASP 51 mmHg, moderate TR.   LUE [place]: No LUE DVT  Objective:   Weight Range: 161 lb 9.6 oz (73.3 kg) Body mass index is 22.54 kg/m.   Vital Signs:   Temp:  [97.4 F (36.3 C)-98.3 F (36.8 C)] 97.8 F (36.6 C) (10/14 0503) Pulse Rate:  [68-77] 77 (10/14 0503) Resp:  [18] 18 (10/14 0503) BP: (78-104)/(52-67) 102/58 (10/14 0832) SpO2:  [95 %-99 %] 96 % (10/14 0503) Weight:  [161 lb 9.6 oz (73.3 kg)] 161 lb 9.6 oz (73.3 kg) (10/14 0503) Last BM Date: 02/22/17  Weight change: Filed Weights   02/21/17 2310 02/22/17 0600 02/23/17 0503  Weight: 162 lb 6.4 oz (73.7 kg) 161 lb (73 kg) 161 lb 9.6 oz (73.3 kg)    Intake/Output:   Intake/Output Summary (Last 24 hours) at 02/23/17 1018 Last data filed at 02/23/17 0504  Gross per 24 hour  Intake              270 ml  Output              650 ml  Net             -380 ml      Physical Exam    General:  Chronically ill-appearing HEENT: Normal Neck: Supple. JVP 10 cm.  No lymphadenopathy or thyromegaly appreciated. Cor: PMI nondisplaced. Iregular rate & rhythm. No rubs, gallops or murmurs. Lungs:  Abdomen: Soft, nontender, nondistended. No hepatosplenomegaly. No bruits or masses. Good bowel sounds. Extremities: No cyanosis, clubbing, rash.  2+ edema left forearm.  2+ edema to knees bilaterally.  Neuro: Alert & orientedx3, cranial nerves  grossly intact. moves all 4 extremities w/o difficulty. Affect pleasant   Telemetry   Probably atrial fibrillation (personally reviewed)  Labs    CBC  Recent Labs  02/22/17 0438 02/23/17 0445  WBC 8.2 8.0  NEUTROABS  --  4.0  HGB 10.5* 10.3*  HCT 32.6* 32.7*  MCV 92.4 92.6  PLT 326 356   Basic Metabolic Panel  Recent Labs  02/22/17 0438 02/22/17 1217 02/23/17 0445  NA 138 136 137  K 4.0 3.9 4.2  CL 104 103 105  CO2 _0 GLUCOSE 88 88 89  BUN 24* 25* 24*  CREATININE 0.95 0.86 0.92  CALCIUM 8.1* 7.9* 7.9*  MG 1.4*  --  1.8  PHOS 3.9  --   --    Liver Function Tests  Recent Labs  02/22/17 1217 02/23/17 0445  AST 22 20  ALT 14* 14*  ALKPHOS 183* 179*  BILITOT 0.4 0.5  PROT 5.5* 5.3*  ALBUMIN 1.6* 1.6*   No results for input(s): LIPASE, AMYLASE in the last 72 hours. Cardiac Enzymes  Recent Labs  02/21/17 2347 02/22/17 0438 02/22/17 1217  TROPONINI 0.07* 0.07* 0.06*    BNP: BNP (last 3  results)  Recent Labs  11/19/16 0909 11/22/16 0318 02/21/17 1637  BNP 433.1* 678.9* 626.5*    ProBNP (last 3 results) No results for input(s): PROBNP in the last 8760 hours.   D-Dimer No results for input(s): DDIMER in the last 72 hours. Hemoglobin A1C No results for input(s): HGBA1C in the last 72 hours. Fasting Lipid Panel No results for input(s): CHOL, HDL, LDLCALC, TRIG, CHOLHDL, LDLDIRECT in the last 72 hours. Thyroid Function Tests  Recent Labs  02/22/17 0438  TSH 2.489    Other results:   Imaging    Dg Chest 1 View  Result Date: 02/22/2017 CLINICAL DATA:  Status post LEFT thoracentesis EXAM: CHEST 1 VIEW COMPARISON:  Chest radiograph 02/21/2017 FINDINGS: Interval reduction of LEFT pleural fluid volume. Small effusion remains on the LEFT. No LEFT pneumothorax. Small loculated RIGHT pleural effusion additionally. IMPRESSION: No pneumothorax following LEFT thoracentesis. Reduction in pleural fluid on the LEFT. Electronically Signed    By: Suzy Bouchard M.D.   On: 02/22/2017 16:10   Ct Angio Chest Pe W Or Wo Contrast  Result Date: 02/22/2017 CLINICAL DATA:  Shortness of breath. Bilateral pleural effusions. History of prostate carcinoma. EXAM: CT ANGIOGRAPHY CHEST WITH CONTRAST TECHNIQUE: Multidetector CT imaging of the chest was performed using the standard protocol during bolus administration of intravenous contrast. Multiplanar CT image reconstructions and MIPs were obtained to evaluate the vascular anatomy. CONTRAST:  100 mL Isovue 370 IV COMPARISON:  CT of the chest without contrast on 11/18/2016 and PET scan on 12/12/2016 FINDINGS: Cardiovascular: The pulmonary arteries are densely opacified with contrast. There is no evidence of pulmonary embolism. No pericardial fluid. There is some reflux of contrast into the IVC and hepatic veins consistent with right heart failure. Mediastinum/Nodes: No enlarged mediastinal, hilar, or axillary lymph nodes. Thyroid gland, trachea, and esophagus demonstrate no significant findings. Lungs/Pleura: Bilateral pleural effusions again noted. A smaller right pleural effusion appears stable compared to prior imaging. There is some enlargement of left pleural fluid volume which is now moderate. Lungs show evidence of stable emphysema and probable congestive heart failure. No focal airspace consolidation or pneumothorax identified. Upper Abdomen: No acute abnormality. Musculoskeletal: No chest wall abnormality. No acute or significant osseous findings. Review of the MIP images confirms the above findings. IMPRESSION: 1. No evidence of acute pulmonary embolism. 2. Evidence of congestive heart failure and right heart failure. Chronic bilateral pleural effusions again noted with some enlargement of the left pleural effusion compared to prior studies. There is a smaller right pleural effusion present which appears relatively stable in volume. 3. Stable emphysematous lung disease. Emphysema (ICD10-J43.9).  Electronically Signed   By: Aletta Edouard M.D.   On: 02/22/2017 14:09   US Thoracentesis Asp Pleural Space W/img Guide  Result Date: 02/23/2017 INDICATION: Hit with history of CHF and anasarca, now with pleural effusion. Request is made for diagnostic and therapeutic thoracentesis on the left. EXAM: ULTRASOUND GUIDED DIAGNOSTIC AND THERAPEUTIC LEFT THORACENTESIS MEDICATIONS: 10 mL 1% lidocaine COMPLICATIONS: None immediate. PROCEDURE: An ultrasound guided thoracentesis was thoroughly discussed with the patient and questions answered. The benefits, risks, alternatives and complications were also discussed. The patient understands and wishes to proceed with the procedure. Written consent was obtained. Ultrasound was performed to localize and mark an adequate pocket of fluid in the left chest. The area was then prepped and draped in the normal sterile fashion. 1% Lidocaine was used for local anesthesia. Under ultrasound guidance a Safe-T-Centesis catheter was introduced. Thoracentesis was performed. The catheter was removed  and a dressing applied. FINDINGS: A total of approximately 1.2 liters of pale yellow fluid was removed. Samples were sent to the laboratory as requested by the clinical team. Limited ultrasound of the chest on the right shows loculated pleural fluid. Note patient did undergo a right VATS procedure on 05/20/2016. IMPRESSION: Successful ultrasound guided diagnostic and therapeutic left thoracentesis yielding 1.2 liters of pleural fluid. Electronically Signed   By: Aletta Edouard M.D.   On: 02/22/2017 16:16      Medications:     Scheduled Medications: . feeding supplement (PRO-STAT SUGAR FREE 64)  30 mL Oral TID BM  . folic acid  1 mg Oral Daily  . furosemide  20 mg Intravenous Daily  . furosemide  40 mg Intravenous BID  . gabapentin  100 mg Oral QHS  . midodrine  5 mg Oral TID WC  . multivitamin with minerals  1 tablet Oral Daily  . rivaroxaban  20 mg Oral Q supper  .  simethicone  80 mg Oral Once  . thiamine  100 mg Oral Daily     Infusions:   PRN Medications:  acetaminophen **OR** acetaminophen, HYDROcodone-acetaminophen, ondansetron **OR** ondansetron (ZOFRAN) IV    Patient Profile   71 yo with persistent atrial fibrillation, chronic diastolic CHF with anasarca, hypoalbuminemia was admitted with CHF and hypotension.   Assessment/Plan   1. Acute on chronic diastolic CHF: Patient is volume overloaded but diuresis has been difficult due to low BP and hypoalbuminemia.  He has hypoalbuminemia and suspect a component of 3rd-spacing from low protein.  He has received only low doses of Lasix here and has diuresed very little.  Still volume overloaded on exam with JVD, but suspect he has a significant component of 3rd-spacing as well from hypoalbuminemia that will be difficult to correct until the underlying problem is corrected.  - Will start midodrine 5 mg tid.  - Increase Lasix to 40 mg IV bid.  - Place ted hose.  2. Hypoalbuminemia: With 3rd spacing.  Possibly due to poor po intake x months.  Has seen nutrition and is eating better.  Need to rule out renal protein loss (nephrotic syndrome).  - Check spot urine protein/creatinine ratio.  3. Anorexia: ?Etiology.  Has history of prostate cancer and has been noted to have enlarged mediastinal nodes, but so far workup of lymphadenopathy has not been revealing of cancer.  4. Atrial fibrillation: Persistent, probably in atrial fibrillation today.  Would be reasonable to attempt cardioversion after diuresis as he was recently in NSR.  Will continue Xarelto.  5. Pleural effusions: Bilateral, partially loculated on right.  Had left thoracentesis, fluid appears to be a transudate.  Due to CHF versus low oncotic pressure.   Length of Stay: 2  [name], MD  02/23/2017, 10:18 AM  Advanced Heart Failure Team Pager [phone] (M-F; 7a - 4p)  Please contact [facility] for night-coverage after hours (4p  -7a ) and weekends on [email or web address]

## 2017-02-24 LAB — CBC WITH DIFFERENTIAL/PLATELET
Basophils Absolute: 0 10*3/uL (ref 0.0–0.1)
Basophils Relative: 0 %
EOS PCT: 1 %
Eosinophils Absolute: 0 10*3/uL (ref 0.0–0.7)
HEMATOCRIT: 30.5 % — AB (ref 39.0–52.0)
Hemoglobin: 9.7 g/dL — ABNORMAL LOW (ref 13.0–17.0)
LYMPHS ABS: 2.6 10*3/uL (ref 0.7–4.0)
LYMPHS PCT: 32 %
MCH: 29.4 pg (ref 26.0–34.0)
MCHC: 31.8 g/dL (ref 30.0–36.0)
MCV: 92.4 fL (ref 78.0–100.0)
MONO ABS: 1.1 10*3/uL — AB (ref 0.1–1.0)
MONOS PCT: 14 %
NEUTROS ABS: 4.2 10*3/uL (ref 1.7–7.7)
Neutrophils Relative %: 53 %
PLATELETS: 271 10*3/uL (ref 150–400)
RBC: 3.3 MIL/uL — ABNORMAL LOW (ref 4.22–5.81)
RDW: 16.4 % — AB (ref 11.5–15.5)
WBC: 7.9 10*3/uL (ref 4.0–10.5)

## 2017-02-24 LAB — LIPID PANEL
CHOLESTEROL: 115 mg/dL (ref 0–200)
HDL: 39 mg/dL — ABNORMAL LOW (ref >40)
LDL CALC: 66 mg/dL (ref 0–99)
TRIGLYCERIDES: 51 mg/dL (ref <150)
Total CHOL/HDL Ratio: 2.9 RATIO
VLDL: 10 mg/dL (ref 0–40)

## 2017-02-24 LAB — URINALYSIS, COMPLETE (UACMP) WITH MICROSCOPIC
BILIRUBIN URINE: NEGATIVE
GLUCOSE, UA: NEGATIVE mg/dL
Ketones, ur: NEGATIVE mg/dL
LEUKOCYTES UA: NEGATIVE
NITRITE: NEGATIVE
PROTEIN: 100 mg/dL — AB
SPECIFIC GRAVITY, URINE: 1.008 (ref 1.005–1.030)
Squamous Epithelial / LPF: NONE SEEN
pH: 5 (ref 5.0–8.0)

## 2017-02-24 LAB — BASIC METABOLIC PANEL
Anion gap: 8 (ref 5–15)
BUN: 26 mg/dL — AB (ref 6–20)
CHLORIDE: 100 mmol/L — AB (ref 101–111)
CO2: 29 mmol/L (ref 22–32)
Calcium: 8.2 mg/dL — ABNORMAL LOW (ref 8.9–10.3)
Creatinine, Ser: 0.98 mg/dL (ref 0.61–1.24)
GFR calc Af Amer: >60 mL/min (ref >60)
GFR calc non Af Amer: >60 mL/min (ref >60)
GLUCOSE: 85 mg/dL (ref 65–99)
POTASSIUM: 3.7 mmol/L (ref 3.5–5.1)
Sodium: 137 mmol/L (ref 135–145)

## 2017-02-24 LAB — PATHOLOGIST SMEAR REVIEW

## 2017-02-24 LAB — PROTEIN / CREATININE RATIO, URINE
Creatinine, Urine: 15.28 mg/dL
Protein Creatinine Ratio: 6.15 mg/mg{Cre} — ABNORMAL HIGH (ref 0.00–0.15)
Total Protein, Urine: 94 mg/dL

## 2017-02-24 LAB — MAGNESIUM: Magnesium: 1.7 mg/dL (ref 1.7–2.4)

## 2017-02-24 MED ORDER — FUROSEMIDE 10 MG/ML IJ SOLN
80.0000 mg | Freq: Two times a day (BID) | INTRAMUSCULAR | Status: DC
Start: 2017-02-24 — End: 2017-02-25
  Administered 2017-02-24 – 2017-02-25 (×3): 80 mg via INTRAVENOUS
  Filled 2017-02-24 (×3): qty 8

## 2017-02-24 MED ORDER — POTASSIUM CHLORIDE CRYS ER 20 MEQ PO TBCR
40.0000 meq | EXTENDED_RELEASE_TABLET | Freq: Once | ORAL | Status: AC
  Administered 2017-02-24: 40 meq via ORAL
  Filled 2017-02-24: qty 2

## 2017-02-24 MED ORDER — MAGNESIUM SULFATE 2 GM/50ML IV SOLN
2.0000 g | Freq: Once | INTRAVENOUS | Status: AC
  Administered 2017-02-24: 2 g via INTRAVENOUS
  Filled 2017-02-24: qty 50

## 2017-02-24 NOTE — Progress Notes (Signed)
Pt refusing bed alarm, wife at bedside Educated on high risk due to history of falls  Will continue to monitor

## 2017-02-24 NOTE — Progress Notes (Signed)
Patient ID: ARHAM SYMMONDS, male   DOB: Aug 27, 1945, 71 y.o.   MRN: [id number]  PROGRESS NOTE    MALVERN KADLEC  PJK:[id number] DOB: 1945-10-29 DOA: 02/21/2017 PCP: Flossie Buffy, NP   Brief Narrative:  71 year old male with history of paroxysmal A. fib on xarelto status post DCCV on 80/99/8338, diastolic CHF with LVEF of 65-70% on echo on 05/14/16, COPD with pulmonary hypertension status post right VATS on 05/20/2016 for empyema with concern for underlying lung cancer with lymph node biopsy on 10/22/2016 negative for cancer presented with generalized swelling and fatigue. He was found to be hypotensive in the ED for which he was given IV fluids and Lasix. Cardiology was consulted.  Assessment & Plan:   Active Problems:   COPD mixed type ([place])   Tobacco abuse   Acute respiratory failure with hypoxia (HCC)   Lung mass   Hypoalbuminemia   Hypotension   Fluid overload   Elevated brain natriuretic peptide (BNP) level   Malnutrition of moderate degree   Acute on chronic diastolic CHF (congestive heart failure) (HCC)   Hypoxia - Probably from fluid overload from worsening CHF - Continue oxygen supplementation - CAT scan of the chest was negative for PE  Acute on chronic diastolic heart failure - cardiology following. Patient has anasarca and hypoalbuminemia. Patient received a few doses of IV albumin.  - continue Lasix and midodrine as per cardiology recommendations. - echo showed ejection fraction of 60-65%. Strict input and output, daily weights. Negative fluid balance of 1580 mL since admission - Repeat a.m. labs including creatinine  Bilateral pleural effusion with moderate left-sided pleural effusion - status post thoracentesis and removal of 1.2 L fluid on 02/22/2017. - Repeat chest x-ray in a.m.  Hypotension - monitor blood pressure. Continue midodrine.   Hypoalbuminemia due to probable moderate protein calorie malnutrition - follow nutrition recommendations. Patient  has received a 7 doses of IV albumin since admission.  History of mediastinal lymphadenopathy - Status post biopsy in the past which was negative. Outpatient follow-up with oncology. - CAT scan did not show significant mediastinal lymphadenopathy  Chronic generalized pain - patient and family state that they don't use Neurontin currently. Continue Lidoderm patch. PT/OT eval. Patient had a PET scan done in August 2018 which did not show any evidence of skeletal metastases to account for his chronic upper and lower back pain.  History of COPD - Stable  Hypomagnesemia - improved  Generalized deconditioning - PT/OT eval. Care management consult   DVT prophylaxis: xarelto Code Status:  Full Family Communication: discussed with wife at bedside Disposition Plan:depends on clinical outcome  Consultants: Cardiology  Procedures:  Study Conclusions  - Left ventricle: The cavity size was normal. There was moderate   concentric hypertrophy. Systolic function was normal. The   estimated ejection fraction was in the range of 60% to 65%. Wall   motion was normal; there were no regional wall motion   abnormalities. The study was not technically sufficient to allow   evaluation of LV diastolic dysfunction due to atrial   fibrillation. - Aortic valve: Trileaflet; normal thickness, mildly calcified   leaflets. There was trivial regurgitation. - Mitral valve: Calcified annulus. There was mild regurgitation. - Left atrium: The atrium was mildly dilated. - Right ventricle: The cavity size was moderately dilated. Wall   thickness was normal. - Right atrium: The atrium was massively dilated. - Tricuspid valve: There was moderate regurgitation. - Pulmonic valve: There was trivial regurgitation. - Pulmonary arteries: PA peak pressure:  51 mm Hg (S).  Left-sided thoracentesis and removal of 1.2 L fluid on 02/22/2017  Antimicrobials:None   Subjective: Patient seen and examined at bedside.he  feels slightly better than yesterday. He states that he is appetite has significantly improved. His shortness of breath is improving but is still significantly short of breath on exertion. He denies any current dizziness or chest pain. Overnight fever, nausea or vomiting  Objective: Vitals:   02/23/17 1802 02/23/17 2046 02/24/17 0527 02/24/17 0900  BP: (!) 94/58 102/71 (!) 88/52 (!) 88/50  Pulse:  73 67 72  Resp:  _0 Temp:  98 F (36.7 C) 97.8 F (36.6 C)   TempSrc:  Oral Oral   SpO2:  97% 97%   Weight:   71.8 kg (158 lb 6.4 oz)   Height:        Intake/Output Summary (Last 24 hours) at 02/24/17 1102 Last data filed at 02/24/17 1055  Gross per 24 hour  Intake             1460 ml  Output             2350 ml  Net             -890 ml   Filed Weights   02/22/17 0600 02/23/17 0503 02/24/17 0527  Weight: 73 kg (161 lb) 73.3 kg (161 lb 9.6 oz) 71.8 kg (158 lb 6.4 oz)    Examination:  General exam:Ill-looking man; currently in no distress  Respiratory system: Bilateral decreased breath sound at bases With basilar crackles  Cardiovascular system: S1 & S2 heard,rate controlled   Gastrointestinal system: Abdomen is nondistended, soft and nontender. Normal bowel sounds heard. Extremities: No cyanosis, clubbing; Trace edema bilaterally; left upper extremity edema    Data Reviewed: I have personally reviewed following labs and imaging studies  CBC:  Recent Labs Lab 02/21/17 1637 02/22/17 0438 02/23/17 0445 02/24/17 0516  WBC 8.4 8.2 8.0 7.9  NEUTROABS  --   --  4.0 4.2  HGB 12.7* 10.5* 10.3* 9.7*  HCT 38.7* 32.6* 32.7* 30.5*  MCV 93.0 92.4 92.6 92.4  PLT 333 326 318 270   Basic Metabolic Panel:  Recent Labs Lab 02/21/17 1637 02/22/17 0438 02/22/17 1217 02/23/17 0445 02/24/17 0516  NA 137 138 136 137 137  K 4.3 4.0 3.9 4.2 3.7  CL 103 104 103 105 100*  CO2 _1 GLUCOSE 107* 88 88 89 85  BUN 26* 24* 25* 24* 26*  CREATININE 0.95 0.95 0.86 0.92  0.98  CALCIUM 8.4* 8.1* 7.9* 7.9* 8.2*  MG  --  1.4*  --  1.8 1.7  PHOS  --  3.9  --   --   --    GFR: Estimated Creatinine Clearance: 68.3 mL/min (by C-G formula based on SCr of 0.98 mg/dL). Liver Function Tests:  Recent Labs Lab 02/21/17 1637 02/22/17 0438 02/22/17 1217 02/23/17 0445  AST _2 ALT 16* 15* 14* 14*  ALKPHOS 215* 185* 183* 179*  BILITOT 0.6 0.6 0.4 0.5  PROT 6.0* 5.4* 5.5* 5.3*  ALBUMIN 1.7* 1.5* 1.6* 1.6*   No results for input(s): LIPASE, AMYLASE in the last 168 hours. No results for input(s): AMMONIA in the last 168 hours. Coagulation Profile: No results for input(s): INR, PROTIME in the last 168 hours. Cardiac Enzymes:  Recent Labs Lab 02/21/17 1637 02/21/17 2347 02/22/17 0438 02/22/17 1217  TROPONINI 0.06* 0.07* 0.07* 0.06*   BNP (last 3  results) No results for input(s): PROBNP in the last 8760 hours. HbA1C: No results for input(s): HGBA1C in the last 72 hours. CBG: No results for input(s): GLUCAP in the last 168 hours. Lipid Profile: No results for input(s): CHOL, HDL, LDLCALC, TRIG, CHOLHDL, LDLDIRECT in the last 72 hours. Thyroid Function Tests:  Recent Labs  02/22/17 0438  TSH 2.489   Anemia Panel: No results for input(s): VITAMINB12, FOLATE, FERRITIN, TIBC, IRON, RETICCTPCT in the last 72 hours. Sepsis Labs: No results for input(s): PROCALCITON, LATICACIDVEN in the last 168 hours.  Recent Results (from the past 240 hour(s))  Gram stain     Status: None   Collection Time: 02/22/17  3:36 PM  Result Value Ref Range Status   Specimen Description PLEURAL LEFT  Final   Special Requests NONE  Final   Gram Stain   Final    CYTOSPIN SMEAR WBC PRESENT,BOTH PMN AND MONONUCLEAR NO ORGANISMS SEEN    Report Status 02/23/2017 FINAL  Final  Culture, body fluid-bottle     Status: None (Preliminary result)   Collection Time: 02/22/17  3:36 PM  Result Value Ref Range Status   Specimen Description PLEURAL LEFT  Final   Special  Requests NONE  Final   Gram Stain   Final    GRAM POSITIVE COCCI IN CLUSTERS AEROBIC BOTTLE ONLY CRITICAL RESULT CALLED TO, READ BACK BY AND VERIFIED WITH: L MCELWEE,RN AT 1820 02/23/17 BY L BENFIELD    Culture GRAM POSITIVE COCCI  Final   Report Status PENDING  Incomplete         Radiology Studies: Dg Chest 1 View  Result Date: 02/22/2017 CLINICAL DATA:  Status post LEFT thoracentesis EXAM: CHEST 1 VIEW COMPARISON:  Chest radiograph 02/21/2017 FINDINGS: Interval reduction of LEFT pleural fluid volume. Small effusion remains on the LEFT. No LEFT pneumothorax. Small loculated RIGHT pleural effusion additionally. IMPRESSION: No pneumothorax following LEFT thoracentesis. Reduction in pleural fluid on the LEFT. Electronically Signed   By: Suzy Bouchard M.D.   On: 02/22/2017 16:10   Ct Angio Chest Pe W Or Wo Contrast  Result Date: 02/22/2017 CLINICAL DATA:  Shortness of breath. Bilateral pleural effusions. History of prostate carcinoma. EXAM: CT ANGIOGRAPHY CHEST WITH CONTRAST TECHNIQUE: Multidetector CT imaging of the chest was performed using the standard protocol during bolus administration of intravenous contrast. Multiplanar CT image reconstructions and MIPs were obtained to evaluate the vascular anatomy. CONTRAST:  100 mL Isovue 370 IV COMPARISON:  CT of the chest without contrast on 11/18/2016 and PET scan on 12/12/2016 FINDINGS: Cardiovascular: The pulmonary arteries are densely opacified with contrast. There is no evidence of pulmonary embolism. No pericardial fluid. There is some reflux of contrast into the IVC and hepatic veins consistent with right heart failure. Mediastinum/Nodes: No enlarged mediastinal, hilar, or axillary lymph nodes. Thyroid gland, trachea, and esophagus demonstrate no significant findings. Lungs/Pleura: Bilateral pleural effusions again noted. A smaller right pleural effusion appears stable compared to prior imaging. There is some enlargement of left pleural  fluid volume which is now moderate. Lungs show evidence of stable emphysema and probable congestive heart failure. No focal airspace consolidation or pneumothorax identified. Upper Abdomen: No acute abnormality. Musculoskeletal: No chest wall abnormality. No acute or significant osseous findings. Review of the MIP images confirms the above findings. IMPRESSION: 1. No evidence of acute pulmonary embolism. 2. Evidence of congestive heart failure and right heart failure. Chronic bilateral pleural effusions again noted with some enlargement of the left pleural effusion compared to prior studies. There  is a smaller right pleural effusion present which appears relatively stable in volume. 3. Stable emphysematous lung disease. Emphysema (ICD10-J43.9). Electronically Signed   By: Aletta Edouard M.D.   On: 02/22/2017 14:09   US Thoracentesis Asp Pleural Space W/img Guide  Result Date: 02/23/2017 INDICATION: Hit with history of CHF and anasarca, now with pleural effusion. Request is made for diagnostic and therapeutic thoracentesis on the left. EXAM: ULTRASOUND GUIDED DIAGNOSTIC AND THERAPEUTIC LEFT THORACENTESIS MEDICATIONS: 10 mL 1% lidocaine COMPLICATIONS: None immediate. PROCEDURE: An ultrasound guided thoracentesis was thoroughly discussed with the patient and questions answered. The benefits, risks, alternatives and complications were also discussed. The patient understands and wishes to proceed with the procedure. Written consent was obtained. Ultrasound was performed to localize and mark an adequate pocket of fluid in the left chest. The area was then prepped and draped in the normal sterile fashion. 1% Lidocaine was used for local anesthesia. Under ultrasound guidance a Safe-T-Centesis catheter was introduced. Thoracentesis was performed. The catheter was removed and a dressing applied. FINDINGS: A total of approximately 1.2 liters of pale yellow fluid was removed. Samples were sent to the laboratory as  requested by the clinical team. Limited ultrasound of the chest on the right shows loculated pleural fluid. Note patient did undergo a right VATS procedure on 05/20/2016. IMPRESSION: Successful ultrasound guided diagnostic and therapeutic left thoracentesis yielding 1.2 liters of pleural fluid. Electronically Signed   By: Aletta Edouard M.D.   On: 02/22/2017 16:16        Scheduled Meds: . feeding supplement (PRO-STAT SUGAR FREE 64)  30 mL Oral TID BM  . folic acid  1 mg Oral Daily  . furosemide  80 mg Intravenous BID  . gabapentin  100 mg Oral QHS  . lidocaine  1 patch Transdermal Q24H  . midodrine  5 mg Oral TID WC  . multivitamin with minerals  1 tablet Oral Daily  . rivaroxaban  20 mg Oral Q supper  . simethicone  80 mg Oral Once  . sodium chloride flush  3 mL Intravenous Q12H  . thiamine  100 mg Oral Daily   Continuous Infusions: . sodium chloride    . vancomycin Stopped (02/24/17 1024)     LOS: 3 days        Aline August, MD [facility] Pager [phone]  If 7PM-7AM, please contact night-coverage [email or web address] Password [email or web address] 02/24/2017, 11:02 AM

## 2017-02-24 NOTE — Progress Notes (Signed)
MD Mclean stated to give lasix, add heart healthy diet and keep NPO after midnight tonight  MD aware of pt low blood pressure 88/52

## 2017-02-24 NOTE — Progress Notes (Signed)
Patient ID: [name], male   DOB: 1945/11/19, 71 y.o.   MRN: [id number]     Advanced Heart Failure Rounding Note  Primary Cardiologist: Radford Pax HF: (new) Deny Chevez  Subjective:    Patient was admitted with fatigue and edema. Difficult to diurese due to low BP.  Marked hypoalbuminemia with poor po intake x months.  Urine protein/creatinine ratio 6.67 from 10/14, suggestive of nephrotic-range proteinuria.   Had left thoracentesis on 10/13 with 1.2 L out, looks like transudate but growing GPCs so started on vancomycin.  Has history of empyema on right.   He remains in atrial fibrillation SBP 90s-100s on midodrine.  Creatinine stable.  He diuresed better yesterday on higher Lasix, weight down.   Echo: EF 60-65%, mild MR, moderately dilated RV, PASP 51 mmHg, moderate TR.   LUE [place]: No LUE DVT  Objective:   Weight Range: 158 lb 6.4 oz (71.8 kg) Body mass index is 22.09 kg/m.   Vital Signs:   Temp:  [97.8 F (36.6 C)-98 F (36.7 C)] 97.8 F (36.6 C) (10/15 0527) Pulse Rate:  [61-73] 67 (10/15 0527) Resp:  [16-18] 16 (10/15 0527) BP: (88-102)/(52-71) 88/52 (10/15 0527) SpO2:  [97 %-98 %] 97 % (10/15 0527) Weight:  [158 lb 6.4 oz (71.8 kg)] 158 lb 6.4 oz (71.8 kg) (10/15 0527) Last BM Date: 02/23/17  Weight change: Filed Weights   02/22/17 0600 02/23/17 0503 02/24/17 0527  Weight: 161 lb (73 kg) 161 lb 9.6 oz (73.3 kg) 158 lb 6.4 oz (71.8 kg)    Intake/Output:   Intake/Output Summary (Last 24 hours) at 02/24/17 0730 Last data filed at 02/23/17 2200  Gross per 24 hour  Intake             1210 ml  Output             2050 ml  Net             -840 ml      Physical Exam    General: NAD, chronically ill-appearing Neck: JVP 10-12 cm, no thyromegaly or thyroid nodule.  Lungs: Decreased breath sounds at bases bilaterally.  CV: Nondisplaced PMI.  Heart irregular S1/S2, no S3/S4, no murmur.  1+ edema to knees bilaterally, 1+ forearm edema on left.  Abdomen: Soft, nontender,  no hepatosplenomegaly, no distention.  Skin: Intact without lesions or rashes.  Neurologic: Alert and oriented x 3.  Psych: Normal affect. Extremities: No clubbing or cyanosis.  HEENT: Normal.    Telemetry   Atrial fibrillation in 70s (personally reviewed)  Labs    CBC  Recent Labs  02/23/17 0445 02/24/17 0516  WBC 8.0 7.9  NEUTROABS 4.0 4.2  HGB 10.3* 9.7*  HCT 32.7* 30.5*  MCV 92.6 92.4  PLT 318 244   Basic Metabolic Panel  Recent Labs  02/22/17 0438  02/23/17 0445 02/24/17 0516  NA 138  < > 137 137  K 4.0  < > 4.2 3.7  CL 104  < > 105 100*  CO2 27  < > 27 29  GLUCOSE 88  < > 89 85  BUN 24*  < > 24* 26*  CREATININE 0.95  < > 0.92 0.98  CALCIUM 8.1*  < > 7.9* 8.2*  MG 1.4*  --  1.8 1.7  PHOS 3.9  --   --   --   < > = values in this interval not displayed. Liver Function Tests  Recent Labs  02/22/17 1217 02/23/17 0445  AST 22 20  ALT 14* 14*  ALKPHOS 183* 179*  BILITOT 0.4 0.5  PROT 5.5* 5.3*  ALBUMIN 1.6* 1.6*   No results for input(s): LIPASE, AMYLASE in the last 72 hours. Cardiac Enzymes  Recent Labs  02/21/17 2347 02/22/17 0438 02/22/17 1217  TROPONINI 0.07* 0.07* 0.06*    BNP: BNP (last 3 results)  Recent Labs  11/19/16 0909 11/22/16 0318 02/21/17 1637  BNP 433.1* 678.9* 626.5*    ProBNP (last 3 results) No results for input(s): PROBNP in the last 8760 hours.   D-Dimer No results for input(s): DDIMER in the last 72 hours. Hemoglobin A1C No results for input(s): HGBA1C in the last 72 hours. Fasting Lipid Panel No results for input(s): CHOL, HDL, LDLCALC, TRIG, CHOLHDL, LDLDIRECT in the last 72 hours. Thyroid Function Tests  Recent Labs  02/22/17 0438  TSH 2.489    Other results:   Imaging    No results found.   Medications:     Scheduled Medications: . feeding supplement (PRO-STAT SUGAR FREE 64)  30 mL Oral TID BM  . folic acid  1 mg Oral Daily  . furosemide  40 mg Intravenous BID  . gabapentin  100  mg Oral QHS  . lidocaine  1 patch Transdermal Q24H  . midodrine  5 mg Oral TID WC  . multivitamin with minerals  1 tablet Oral Daily  . rivaroxaban  20 mg Oral Q supper  . simethicone  80 mg Oral Once  . sodium chloride flush  3 mL Intravenous Q12H  . thiamine  100 mg Oral Daily    Infusions: . sodium chloride    . vancomycin Stopped (02/23/17 2139)    PRN Medications: acetaminophen **OR** acetaminophen, HYDROcodone-acetaminophen, ondansetron **OR** ondansetron (ZOFRAN) IV, sodium chloride flush    Patient Profile   72 yo with persistent atrial fibrillation, chronic diastolic CHF with anasarca, hypoalbuminemia was admitted with CHF and hypotension.   Assessment/Plan   1. Acute on chronic diastolic CHF: Patient is volume overloaded but diuresis has been difficult due to low BP and hypoalbuminemia.  He has hypoalbuminemia and suspect a component of 3rd-spacing from low protein.  Still volume overloaded on exam with JVD, but suspect he has a significant component of 3rd-spacing as well from hypoalbuminemia that will be difficult to correct until the underlying problem is corrected.  Midodrine started yesterday given soft BP/orthostasis and Lasix increased to 40 mg bid.  Better diuresis, weight down.  - Continue midodrine 5 mg tid.  - Increase Lasix to 80 mg IV bid. Replace K and Mg.  - Continue ted hose.  2. Hypoalbuminemia: With 3rd spacing.  Possibly due to poor po intake x months.  Has seen nutrition and is eating better.  However, spot urine protein/creatinine ratio yesterday was 6.67.  This is suggestive of nephrotic range proteinuria.  I will ask renal to consult for nephrotic syndrome.  3. Anorexia: ?Etiology.  Has history of prostate cancer and has been noted to have enlarged mediastinal nodes, but so far workup of lymphadenopathy has not been revealing of cancer.  4. Atrial fibrillation: Persistent,  in atrial fibrillation today.  Would be reasonable to attempt cardioversion  after diuresis as he was recently in NSR.  Will continue Xarelto.  - Tentatively scheduled for DCCV tomorrow.  5. Pleural effusions: Bilateral, partially loculated on right.  Had left thoracentesis, fluid appeared to be a transudate but growing GPCs now.  He has been started on vancomycin, no fever/normal WBCs.  Awaiting final culture data.   Length  of Stay: 3  [name], MD  02/24/2017, 7:30 AM  Advanced Heart Failure Team Pager [phone] (M-F; 7a - 4p)  Please contact [facility] for night-coverage after hours (4p -7a ) and weekends on [email or web address]

## 2017-02-24 NOTE — Consult Note (Signed)
Reason for Consult: Proteinuria and anasarca  Referring Physician: Dr. Eulah Citizen [name] is an 71 y.o. male.  HPI: Patient is a Gaffer with a past medical history of paroxysmal atrial fibrillation ( currently on xarelto ), diastolic CHF, COPD, pulmonary hypertension, prostate cancer (stage T1 currently on active surveillance at [facility] in [facility]) with mediastinal lymphadenopathy (biopsied 6/18- non diagnostic, axumin scan 8/18 without radiotracer uptake) and anemia of chronic disease. Patient presented 10/12 with generalized edema and fatigue. He reports intermittent peripheral edema since January 2017. In the past 4 months (since the time of lymph node biopsy) the swelling has gotten significantly worse in his left arm and over the days prior to admission his lower extremity swelling worsened.   He was sent to the ED from his cardiologist office when he presented with the left arm edema and he was found to be hypotensive. The ED he was found to be hypotensive with a blood pressure 85/62. He had pitting edema in bilateral lower extremities and the left arm. Labs were significant for an albumin of 1.7, alkaline phosphatase 215, and BNP 626. Urinalysis showed 1+ protein without RBC or cast. Protein/creatinine ratio 10/14 was 6.67, this was collected mid day.  He had thoracentesis 10/13 which was consistent with transudative, gram stain revealed GPCs in clusters and culture is pending. He has been started on vancomycin. He is maintaining good urine output and had no worsening of his creatinine.   He reports intermittent frothy urine over the past 10 years. Denies changes in urine output.   Trend in Creatinine:  Creatinine, Ser  Date/Time Value Ref Range Status  02/24/2017 05:16 AM 0.98 0.61 - 1.24 mg/dL Final  02/23/2017 04:45 AM 0.92 0.61 - 1.24 mg/dL Final  02/22/2017 12:17 PM 0.86 0.61 - 1.24 mg/dL Final  02/22/2017 04:38 AM 0.95 0.61 - 1.24 mg/dL Final  02/21/2017  04:37 PM 0.95 0.61 - 1.24 mg/dL Final  01/09/2017 12:56 PM 0.92 0.76 - 1.27 mg/dL Final  12/23/2016 12:53 PM 0.65 0.40 - 1.50 mg/dL Final  12/02/2016 10:57 AM 0.82 0.40 - 1.50 mg/dL Final  11/23/2016 04:22 AM 0.93 0.61 - 1.24 mg/dL Final  11/22/2016 03:18 AM 0.89 0.61 - 1.24 mg/dL Final  11/21/2016 04:51 AM 1.16 0.61 - 1.24 mg/dL Final  11/20/2016 05:47 AM 1.00 0.61 - 1.24 mg/dL Final  11/19/2016 03:48 AM 1.15 0.61 - 1.24 mg/dL Final  11/18/2016 04:53 AM 1.08 0.61 - 1.24 mg/dL Final  11/17/2016 08:12 PM 1.48 (H) 0.61 - 1.24 mg/dL Final  10/17/2016 12:25 PM 1.01 0.61 - 1.24 mg/dL Final  10/04/2016 11:34 AM 0.94 0.76 - 1.27 mg/dL Final  09/12/2016 11:46 AM 1.02 0.76 - 1.27 mg/dL Final  09/03/2016 11:25 AM 0.82 0.76 - 1.27 mg/dL Final  06/08/2016 08:13 PM 1.10 0.61 - 1.24 mg/dL Final  05/31/2016 02:50 AM 1.09 0.61 - 1.24 mg/dL Final  05/30/2016 03:46 AM 0.94 0.61 - 1.24 mg/dL Final  05/30/2016 03:46 AM 1.01 0.61 - 1.24 mg/dL Final  05/29/2016 03:37 AM 0.98 0.61 - 1.24 mg/dL Final  05/29/2016 03:37 AM 0.95 0.61 - 1.24 mg/dL Final  05/28/2016 02:47 AM 1.03 0.61 - 1.24 mg/dL Final  05/27/2016 03:30 AM 1.14 0.61 - 1.24 mg/dL Final  05/26/2016 05:00 AM 1.07 0.61 - 1.24 mg/dL Final  05/25/2016 05:00 AM 1.24 0.61 - 1.24 mg/dL Final  05/24/2016 04:20 AM 1.13 0.61 - 1.24 mg/dL Final  05/23/2016 03:50 AM 1.14 0.61 - 1.24 mg/dL Final  05/22/2016 03:45 AM 1.17 0.61 -  1.24 mg/dL Final  05/21/2016 04:10 AM 1.23 0.61 - 1.24 mg/dL Final  05/20/2016 03:20 AM 0.79 0.61 - 1.24 mg/dL Final  05/19/2016 10:38 AM 0.84 0.61 - 1.24 mg/dL Final  05/19/2016 04:45 AM 0.79 0.61 - 1.24 mg/dL Final  05/18/2016 06:39 AM 0.81 0.61 - 1.24 mg/dL Final  05/17/2016 01:36 AM 0.90 0.61 - 1.24 mg/dL Final  05/16/2016 05:07 AM 0.82 0.61 - 1.24 mg/dL Final  05/15/2016 09:11 AM 1.12 0.61 - 1.24 mg/dL Final  05/14/2016 03:47 AM 1.08 0.61 - 1.24 mg/dL Final  05/13/2016 10:59 PM 1.29 (H) 0.61 - 1.24 mg/dL Final  08/15/2015  12:32 PM 0.73 0.61 - 1.24 mg/dL Final  08/08/2015 10:43 AM 0.83 0.61 - 1.24 mg/dL Final    PMH:   Past Medical History:  Diagnosis Date  . Anxiety   . Arthritis    "neck" (08/15/2015)  . Chronic bronchitis ([place])   . Chronic diastolic CHF (congestive heart failure) ([place])   . Constipation   . COPD (chronic obstructive pulmonary disease) ([place])   . DDD (degenerative disc disease), cervical   . Depression   . Dilated aortic root ([place])    51m on echo 05/2016  . Edema of left lower extremity   . Facial basal cell cancer   . H/O acne vulgaris 1960s   [facility]  . Headache    history of - left temporal- years ago- not current (08/15/2015)  . Persistent atrial fibrillation (HCC)    on coumadin with CHADS2VASC score of 2  . Pneumonia 1960s; 2015 X 2  . Prostate cancer [place] 2000[place]   "low spreading; non aggressive type" (08/15/2015)  . Pulmonary HTN ([place]    PASP 468mg by echo 05/2016  . Skin cancer    "back"    PSH:   Past Surgical History:  Procedure Laterality Date  . CARDIOVERSION N/A 10/11/2016   Procedure: CARDIOVERSION;  Surgeon: CrSanda KleinMD;  [facility]: [facility] END[facility];  Service: Cardiovascular;  Laterality: N/A;  . COLONOSCOPY    . EXCISIONAL HEMORRHOIDECTOMY  1960s  . INGUINAL HERNIA REPAIR Right 2002  . INGUINAL HERNIA REPAIR Bilateral 08/15/2015  . INGUINAL HERNIA REPAIR Bilateral 08/15/2015   Procedure: OPEN REPAIR RECURRENT RIGHT INGUINAL HERNIA WITH MESH AND REPAIR LEFT INGUINAL HERNIA WITH MESH;  Surgeon: HaFanny SkatesMD;  [facility] General;  Laterality: Bilateral;  . INSERTION OF MESH Bilateral 08/15/2015   Procedure: INSERTION OF MESH;  Surgeon: HaFanny SkatesMD;  [facility] [place]: General;  Laterality: Bilateral;  . IR THORACENTESIS ASP PLEURAL SPACE W/IMG GUIDE  11/18/2016  . LYMPH NODE BIOPSY Bilateral 10/22/2016   Procedure: LEFT NECK LYMPH NODE BIOPSY;  Surgeon: VaIvin PootMD;   [place] [facility] [place] Service: Thoracic;  Laterality: Bilateral;  . PROSTATE BIOPSY    . TONSILLECTOMY  1950s  . VIDEO ASSISTED THORACOSCOPY (VATS)/EMPYEMA Right 05/20/2016   Procedure: VIDEO ASSISTED THORACOSCOPY (VATS) with drainage of pleural effusion;  Surgeon: PeIvin PootMD;  [place] [facility] [facility]: Thoracic;  Laterality: Right;  . Marland KitchenIDEO BRONCHOSCOPY WITH ENDOBRONCHIAL ULTRASOUND Right 05/20/2016   Procedure: VIDEO BRONCHOSCOPY WITH ENDOBRONCHIAL ULTRASOUND;  Surgeon: PeIvin PootMD;  [place] Thoracic;  Laterality: Right;    Allergies:  Allergies  Allergen Reactions  . Demerol [Meperidine] Other (See Comments)    UNSPECIFIED REACTION  Causes system to shutdown. ?   . Oxycodone Hcl Other (See  Comments)    Pt states this medication 'wires him up' and makes pt hyper; pt does not want to take this ever again    Medications:   Prior to Admission medications   Medication Sig Start Date End Date Taking? Authorizing Provider  acetaminophen (TYLENOL) 325 MG tablet Take 2 tablets (650 mg total) by mouth every 6 (six) hours as needed for mild pain or fever. 11/24/16  Yes Hongalgi, Lenis Dickinson, MD  furosemide (LASIX) 40 MG tablet Take 0.5 tablets (20 mg total) by mouth every other day. 01/14/17  Yes Turner, Eber Hong, MD  polyethylene glycol (MIRALAX / GLYCOLAX) packet Take 17 g by mouth daily as needed. 12/30/16  Yes Nche, Charlene Brooke, NP  rivaroxaban (XARELTO) 20 MG TABS tablet Take 1 tablet (20 mg total) by mouth daily with supper. 01/14/17  Yes [name], MD    Inpatient medications: . feeding supplement (PRO-STAT SUGAR FREE 64)  30 mL Oral TID BM  . folic acid  1 mg Oral Daily  . furosemide  80 mg Intravenous BID  . gabapentin  100 mg Oral QHS  . lidocaine  1 patch Transdermal Q24H  . midodrine  5 mg Oral TID WC  . multivitamin with minerals  1 tablet Oral Daily  . rivaroxaban  20 mg Oral Q supper  . simethicone  80 mg Oral Once  . sodium chloride flush  3 mL  Intravenous Q12H  . thiamine  100 mg Oral Daily    Discontinued Meds:   Medications Discontinued During This Encounter  Medication Reason  . furosemide (LASIX) injection 20 mg   . albumin human 25 % solution 12.5 g   . furosemide (LASIX) injection 40 mg   . furosemide (LASIX) injection 40 mg     Social History:  reports that he quit smoking about 9 months ago. His smoking use included Cigarettes. He has a 62.00 pack-year smoking history. He has never used smokeless tobacco. He reports that he does not drink alcohol or use drugs.  Family History:   Family History  Problem Relation Age of Onset  . Colon cancer Mother   . Ovarian cancer Mother   . Alcoholism Father   . CAD Father   . Alcohol abuse Father   . Ovarian cancer Sister   . Diabetes Neg Hx   . Stomach cancer Neg Hx     Pertinent items noted in HPI and remainder of comprehensive ROS otherwise negative. Weight change: -3 lb 3.2 oz (-1.452 kg)  Intake/Output Summary (Last 24 hours) at 02/24/17 1652 Last data filed at 02/24/17 1527  Gross per 24 hour  Intake             1050 ml  Output             2725 ml  Net            -1675 ml   BP 105/65 (BP Location: Right Arm)   Pulse 70   Temp 98.1 F (36.7 C) (Oral)   Resp 18   Ht 5' 11" (1.803 m)   Wt 158 lb 6.4 oz (71.8 kg)   SpO2 97%   BMI 22.09 kg/m  Vitals:   02/24/17 0527 02/24/17 0900 02/24/17 1219 02/24/17 1642  BP: (!) 88/52 (!) 88/50 (!) 89/62 105/65  Pulse: 67 72 (!) 51 70  Resp: _0 Temp: 97.8 F (36.6 C)  98.1 F (36.7 C)   TempSrc: Oral  Oral   SpO2:  97%  97%   Weight: 158 lb 6.4 oz (71.8 kg)     Height:        General: chronically ill appearing, cachectic  Cardiac:  irregularly irregular, no murmurs appreciated, 1+ non pitting edema of b/l lower extremities, 1+ pitting edema of bilateral upper extremities  Pulmonary: decreased breath sounds at bilateral bases   Labs: Basic Metabolic Panel:  Recent Labs Lab 02/21/17 1637  02/22/17 0438 02/22/17 1217 02/23/17 0445 02/24/17 0516  NA 137 138 136 137 137  K 4.3 4.0 3.9 4.2 3.7  CL 103 104 103 105 100*  CO2 _0 GLUCOSE 107* 88 88 89 85  BUN 26* 24* 25* 24* 26*  CREATININE 0.95 0.95 0.86 0.92 0.98  ALBUMIN 1.7* 1.5* 1.6* 1.6*  --   CALCIUM 8.4* 8.1* 7.9* 7.9* 8.2*  PHOS  --  3.9  --   --   --    Liver Function Tests:  Recent Labs Lab 02/22/17 0438 02/22/17 1217 02/23/17 0445  AST _1 ALT 15* 14* 14*  ALKPHOS 185* 183* 179*  BILITOT 0.6 0.4 0.5  PROT 5.4* 5.5* 5.3*  ALBUMIN 1.5* 1.6* 1.6*   No results for input(s): LIPASE, AMYLASE in the last 168 hours. No results for input(s): AMMONIA in the last 168 hours. CBC:  Recent Labs Lab 02/21/17 1637 02/22/17 0438 02/23/17 0445 02/24/17 0516  WBC 8.4 8.2 8.0 7.9  NEUTROABS  --   --  4.0 4.2  HGB 12.7* 10.5* 10.3* 9.7*  HCT 38.7* 32.6* 32.7* 30.5*  MCV 93.0 92.4 92.6 92.4  PLT 333 326 318 271   PT/INR: _2 (inr:5) Cardiac Enzymes: ) Recent Labs Lab 02/21/17 1637 02/21/17 2347 02/22/17 0438 02/22/17 1217  TROPONINI 0.06* 0.07* 0.07* 0.06*   CBG: No results for input(s): GLUCAP in the last 168 hours.  Iron Studies: No results for input(s): IRON, TIBC, TRANSFERRIN, FERRITIN in the last 168 hours.  Xrays/Other Studies: No results found.   Assessment/Plan: 1. Proteinuria possibly 2/2 nephrotic syndrome  Not yet clear if this is nephrotic-range proteinuria with nephrotic syndrome, there are other possible explanations for his hypoalbmuminemia ( possibly secondary to recent extensive admission for acute illnesses and deconditioning and poor po intake as well as recent infection), although the protein/crt ration is suggestive of nephrotic syndrome it was not collected from first void in the morning and urinalysis with 1+ protein is not nephrotic range. Proteinuria dates back to 2015. If 24 hour urine protein is consistent with nephrotic syndrome, would then  proceed with renal biopsy however anticoagulant will need to be held prior to this.  - 24 hour urine protein collection  - repeat urine/crt with first morning urine collection  - ordered lipid panel  - ordered ANA, Anti- dsDNA, Anti- GBM, ANcA, HIV, Anti- streptolysin O, C3, C4 and total complement, SPEP   Ledell Noss 02/24/2017, 4:52 PM   I have seen and examined this patient and agree with plan and assessment in the above note with renal recommendations/intervention highlighted.  Proteinuria is variable throughout the day which is why first morning urine sample is best to approximate 24 hour proteinuria and given his anticoagulation will need to confirm nephrotic range proteinuria before proceeding with renal biopsy due to risks of bleeding.  He also has other reasons for hypoalbuminemia and he reports having edema for years.  Will continue to follow.   Governor Rooks Montrey Buist,MD 02/24/2017 7:19 PM

## 2017-02-24 NOTE — Progress Notes (Signed)
PT Cancellation Note  Patient Details Name: [name] MRN: [id number] DOB: 1945/07/17   Cancelled Treatment:    Reason Eval/Treat Not Completed: Other (comment). Attempted to see pt x2. Pt refused first time due to recent panic attack over removal of condom catheter and now pt desires to eat breakfast prior to ambulation due to "I need energy". PT to return as able.   Shiah Berhow M Marquita Lias 02/24/2017, 8:26 AM  Kittie Plater, PT, DPT Pager #: [phone] Office #: [phone]

## 2017-02-25 ENCOUNTER — Inpatient Hospital Stay (HOSPITAL_COMMUNITY): Payer: Medicare Other

## 2017-02-25 DIAGNOSIS — I482 Chronic atrial fibrillation: Secondary | ICD-10-CM

## 2017-02-25 LAB — C4 COMPLEMENT: COMPLEMENT C4, BODY FLUID: 14 mg/dL (ref 14–44)

## 2017-02-25 LAB — PROTEIN ELECTROPHORESIS, SERUM
A/G Ratio: 0.6 — ABNORMAL LOW (ref 0.7–1.7)
ALBUMIN ELP: 1.9 g/dL — AB (ref 2.9–4.4)
ALPHA-1-GLOBULIN: 0.2 g/dL (ref 0.0–0.4)
ALPHA-2-GLOBULIN: 0.7 g/dL (ref 0.4–1.0)
BETA GLOBULIN: 0.6 g/dL — AB (ref 0.7–1.3)
GAMMA GLOBULIN: 1.4 g/dL (ref 0.4–1.8)
Globulin, Total: 3 g/dL (ref 2.2–3.9)
M-SPIKE, %: 1.1 g/dL — AB
Total Protein ELP: 4.9 g/dL — ABNORMAL LOW (ref 6.0–8.5)

## 2017-02-25 LAB — BASIC METABOLIC PANEL
Anion gap: 7 (ref 5–15)
BUN: 27 mg/dL — AB (ref 6–20)
CHLORIDE: 98 mmol/L — AB (ref 101–111)
CO2: 30 mmol/L (ref 22–32)
CREATININE: 0.89 mg/dL (ref 0.61–1.24)
Calcium: 8 mg/dL — ABNORMAL LOW (ref 8.9–10.3)
GFR calc Af Amer: >60 mL/min (ref >60)
GFR calc non Af Amer: >60 mL/min (ref >60)
Glucose, Bld: 97 mg/dL (ref 65–99)
POTASSIUM: 3.6 mmol/L (ref 3.5–5.1)
Sodium: 135 mmol/L (ref 135–145)

## 2017-02-25 LAB — CULTURE, BODY FLUID-BOTTLE

## 2017-02-25 LAB — MPO/PR-3 (ANCA) ANTIBODIES
ANCA Proteinase 3: <3.5 U/mL (ref 0.0–3.5)
Myeloperoxidase Abs: <9 U/mL (ref 0.0–9.0)

## 2017-02-25 LAB — CULTURE, BODY FLUID W GRAM STAIN -BOTTLE

## 2017-02-25 LAB — COMPLEMENT, TOTAL

## 2017-02-25 LAB — ANTISTREPTOLYSIN O TITER: ASO: <20 IU/mL (ref 0.0–200.0)

## 2017-02-25 LAB — ANTINUCLEAR ANTIBODIES, IFA: ANTINUCLEAR ANTIBODIES, IFA: NEGATIVE

## 2017-02-25 LAB — MAGNESIUM: Magnesium: 1.7 mg/dL (ref 1.7–2.4)

## 2017-02-25 LAB — ANTI-DNA ANTIBODY, DOUBLE-STRANDED

## 2017-02-25 LAB — HIV ANTIBODY (ROUTINE TESTING W REFLEX): HIV SCREEN 4TH GENERATION: NONREACTIVE

## 2017-02-25 LAB — C3 COMPLEMENT: C3 COMPLEMENT: 111 mg/dL (ref 82–167)

## 2017-02-25 MED ORDER — SODIUM CHLORIDE 0.9% FLUSH: 3.0000 mL | INTRAVENOUS | Status: DC | PRN

## 2017-02-25 MED ORDER — FUROSEMIDE 10 MG/ML IJ SOLN
80.0000 mg | Freq: Every day | INTRAMUSCULAR | Status: DC
  Administered 2017-02-27: 80 mg via INTRAVENOUS
  Filled 2017-02-25 (×2): qty 8

## 2017-02-25 MED ORDER — TRAMADOL HCL 50 MG PO TABS: 50.0000 mg | ORAL_TABLET | Freq: Four times a day (QID) | ORAL | Status: DC | PRN

## 2017-02-25 MED ORDER — SODIUM CHLORIDE 0.9% FLUSH
3.0000 mL | Freq: Two times a day (BID) | INTRAVENOUS | Status: DC
  Administered 2017-02-25 – 2017-02-27 (×3): 3 mL via INTRAVENOUS

## 2017-02-25 MED ORDER — SODIUM CHLORIDE 0.9 % IV SOLN: 250.0000 mL | INTRAVENOUS | Status: DC

## 2017-02-25 NOTE — Progress Notes (Signed)
PT Cancellation Note  Patient Details Name: [name] MRN: [id number] DOB: 1945/10/12   Cancelled Treatment:    Reason Eval/Treat Not Completed: Other (comment) (pt working with mobility tech) PT will continue to follow acutely.    [place] April, PTA Pager: [phone]   02/25/2017, 1:29 PM

## 2017-02-25 NOTE — Progress Notes (Signed)
OT Cancellation    02/25/17 1600  OT Visit Information  Last OT Received On 02/25/17  Reason Eval/Treat Not Completed Patient declined, no reason specified. Pt reporting "I am tired." Pt's wife stating that he did not sleep well last night due to frequent urination and he walked with mobility tech a few hour ago and now is too exhausted to participate in therapy.   Wol[place], OTR/L Acute Rehab Pager: [phone] Office: [phone]

## 2017-02-25 NOTE — Progress Notes (Signed)
S: [name] is feeling well today, denies any new complaints. Collected urine overnight and feels that his peripheral swelling continues to improve today from the day of admission. He request to try a compression stocking for his arm. Prepared for DCCV today. Pleural fluid cultures positive for coag negative staph, awaiting sensitivities.   O:BP (!) 95/50 (BP Location: Right Wrist)   Pulse 73   Temp 98.2 F (36.8 C) (Oral)   Resp 19   Ht _0  (1.803 m)   Wt 153 lb 11.2 oz (69.7 kg)   SpO2 98%   BMI 21.44 kg/m   Intake/Output Summary (Last 24 hours) at 02/25/17 0900 Last data filed at 02/24/17 2300  Gross per 24 hour  Intake              850 ml  Output             3225 ml  Net            -2375 ml   Intake/Output: I/O last 3 completed shifts: In: 3474 [P.O.:720; IV Piggyback:450] Out: 2595 [GLOVF:6433]  Intake/Output this shift:  No intake/output data recorded. Weight change: -4 lb 11.2 oz (-2.132 kg) Gen: chronically ill appearing elder man CVS: irregular rhythm, 1+ dependent edema in b/l upper and lower extremities Resp: decreased breath sounds over lower lung fields, no rales or wheezing  IRJ:JOAC, non distended, non- tender but does report allodynia Ext: pustular rash over left forearm   Recent Labs Lab 02/21/17 1637 02/22/17 0438 02/22/17 1217 02/23/17 0445 02/24/17 0516 02/25/17 0504  NA 137 138 136 137 137 135  K 4.3 4.0 3.9 4.2 3.7 3.6  CL 103 104 103 105 100* 98*  CO2 _1 GLUCOSE 107* 88 88 89 85 97  BUN 26* 24* 25* 24* 26* 27*  CREATININE 0.95 0.95 0.86 0.92 0.98 0.89  ALBUMIN 1.7* 1.5* 1.6* 1.6*  --   --   CALCIUM 8.4* 8.1* 7.9* 7.9* 8.2* 8.0*  PHOS  --  3.9  --   --   --   --   AST _2 --   --   ALT 16* 15* 14* 14*  --   --    Liver Function Tests:  Recent Labs Lab 02/22/17 0438 02/22/17 1217 02/23/17 0445  AST _3 ALT 15* 14* 14*  ALKPHOS 185* 183* 179*  BILITOT 0.6 0.4 0.5  PROT 5.4* 5.5* 5.3*  ALBUMIN  1.5* 1.6* 1.6*   No results for input(s): LIPASE, AMYLASE in the last 168 hours. No results for input(s): AMMONIA in the last 168 hours. CBC:  Recent Labs Lab 02/21/17 1637 02/22/17 0438 02/23/17 0445 02/24/17 0516  WBC 8.4 8.2 8.0 7.9  NEUTROABS  --   --  4.0 4.2  HGB 12.7* 10.5* 10.3* 9.7*  HCT 38.7* 32.6* 32.7* 30.5*  MCV 93.0 92.4 92.6 92.4  PLT 333 326 318 271   Cardiac Enzymes:  Recent Labs Lab 02/21/17 1637 02/21/17 2347 02/22/17 0438 02/22/17 1217  TROPONINI 0.06* 0.07* 0.07* 0.06*   CBG: No results for input(s): GLUCAP in the last 168 hours.  Iron Studies: No results for input(s): IRON, TIBC, TRANSFERRIN, FERRITIN in the last 72 hours. Studies/Results: No results found. . feeding supplement (PRO-STAT SUGAR FREE 64)  30 mL Oral TID BM  . folic acid  1 mg Oral Daily  . furosemide  80 mg Intravenous BID  . gabapentin  100 mg Oral  QHS  . lidocaine  1 patch Transdermal Q24H  . midodrine  5 mg Oral TID WC  . multivitamin with minerals  1 tablet Oral Daily  . rivaroxaban  20 mg Oral Q supper  . simethicone  80 mg Oral Once  . sodium chloride flush  3 mL Intravenous Q12H  . thiamine  100 mg Oral Daily    BMET    Component Value Date/Time   NA 135 02/25/2017 0504   NA 139 01/09/2017 1256   K 3.6 02/25/2017 0504   CL 98 (L) 02/25/2017 0504   CO2 30 02/25/2017 0504   GLUCOSE 97 02/25/2017 0504   BUN 27 (H) 02/25/2017 0504   BUN 26 01/09/2017 1256   CREATININE 0.89 02/25/2017 0504   CREATININE 0.84 06/12/2014 0922   CALCIUM 8.0 (L) 02/25/2017 0504   GFRNONAA >60 02/25/2017 0504   GFRAA >60 02/25/2017 0504   CBC    Component Value Date/Time   WBC 7.9 02/24/2017 0516   RBC 3.30 (L) 02/24/2017 0516   HGB 9.7 (L) 02/24/2017 0516   HGB 13.8 10/04/2016 1134   HCT 30.5 (L) 02/24/2017 0516   HCT 41.5 10/04/2016 1134   PLT 271 02/24/2017 0516   PLT 469 (H) 10/04/2016 1134   MCV 92.4 02/24/2017 0516   MCV 90 10/04/2016 1134   MCH 29.4 02/24/2017 0516    MCHC 31.8 02/24/2017 0516   RDW 16.4 (H) 02/24/2017 0516   RDW 14.9 10/04/2016 1134   LYMPHSABS 2.6 02/24/2017 0516   MONOABS 1.1 (H) 02/24/2017 0516   EOSABS 0.0 02/24/2017 0516   BASOSABS 0.0 02/24/2017 0516    Assessment/Plan:  1. Proteinuria possibly secondary to nephrotic syndrome  Awaiting 24 hour urine protein before deciding whether to proceed with renal biopsy. Lipid panel within the normal limit is not suggestive of nephrotic syndrome. Peripheral edema seems improved from yesterday. C3 complement is on the lower end of normal, a deficiency in this can be related to increased disposition to infections.  - follow up 24 hour urinary protein  - follow up ANA, Anti- dsDNA, Anti- GBM, ANCA, and SPEP  - first void urine protein/creatinine ratio not collected today, reordered for tomorrow  - pt request compression stocking over left upper extremity  2.  Hypotension, chronic and asymptomatic  3. Volume overload- marked diuresis, will decrease lasix due to low UNa and risk for inducing ischemic ATN.    Ledell Noss, MD  PGY 2 IMTS    I have seen and examined this patient and agree with plan and assessment in the above note with renal recommendations/intervention highlighted.  Awaiting 24 hour urine protein quantification prior to proceeding with renal biopsy given risk of bleeding.  He does not have hyperlipidemia and has other reasons for hypoalbuminemia and edema.  He understands the risks of the procedure and will proceed if he does in fact have nephrotic-range proteinuria.  Will continue with diuresis and follow along for now.  Christopher Rooks Payeton Germani,MD 02/25/2017 11:57 AM   Donetta Potts, MD [facility] [phone]

## 2017-02-25 NOTE — Care Management Important Message (Signed)
Important Message  Patient Details  Name: [name] MRN: [id number] Date of Birth: March 07, 1946   Medicare Important Message Given:  Yes    Orbie Pyo 02/25/2017, 12:13 PM

## 2017-02-25 NOTE — Progress Notes (Signed)
[id number] Pt just walked with mobility staff an hour ago. Tired now. Gave CHF booklet and low sodium diets. Discussed 2000 mg sodium restriction and daily weights. Reviewed when to call MD with signs/symptoms.  Pt stated he is limited to 5 8oz cups per day. Understanding voiced of ed. Will follow up tomorrow. Graylon Good RN BSN 02/25/2017 2:41 PM

## 2017-02-25 NOTE — Progress Notes (Addendum)
Patient ID: [name], male   DOB: Jan 08, 1946, 71 y.o.   MRN: [id number]  PROGRESS NOTE    [name]  MVH:[id number] DOB: 01-Oct-1945 DOA: 02/21/2017 PCP: Flossie Buffy, NP   Brief Narrative:  71 year old male with history of paroxysmal A. fib on xarelto status post DCCV on 84/13/2440, diastolic CHF with LVEF of 65-70% on echo on 05/14/16, COPD with pulmonary hypertension status post right VATS on 05/20/2016 for empyema with concern for underlying lung cancer with lymph node biopsy on 10/22/2016 negative for cancer presented with generalized swelling and fatigue. He was found to be hypotensive in the ED for which he was given IV fluids and Lasix. Cardiology was consulted.  Assessment & Plan:   Active Problems:   COPD mixed type ([place])   Tobacco abuse   Acute respiratory failure with hypoxia (HCC)   Lung mass   Hypoalbuminemia   Hypotension   Fluid overload   Elevated brain natriuretic peptide (BNP) level   Malnutrition of moderate degree   Acute on chronic diastolic CHF (congestive heart failure) (HCC)   Hypoxia - Probably from fluid overload from worsening CHF - Continue oxygen supplementation - CAT scan of the chest was negative for PE  Acute on chronic diastolic heart failure - cardiology following. Patient has anasarca and hypoalbuminemia. Patient received 7 doses of IV albumin.  - continue Lasix and midodrine as per cardiology recommendations. - echo showed ejection fraction of 60-65%. Strict input and output, daily weights. Negative fluid balance of 3955 mL since admission - Repeat a.m. labs including creatinine  Bilateral pleural effusion with moderate left-sided pleural effusion - status post thoracentesis and removal of 1.2 L fluid on 02/22/2017. Pleural fluid analysis is growing coagulase negative staph; probably a contaminant. he is afebrile without any leukocytosis and pleural fluid on drainage was clear apparently. Patient is currently on vancomycin but  will consider discontinuing it if okay with the cardiology team as well. - follow repeat cxr for today including decubitus films  Proteinuria - Nephrology following. Patient might need renal biopsy. We will follow recommendations  Persistent A. Fib - cardiology is planning for cardioversion.  Hypotension - monitor blood pressure. Continue midodrine.   Hypoalbuminemia due to probable moderate protein calorie malnutrition - follow nutrition recommendations. Patient has received a 7 doses of IV albumin since admission. - patient's apetite improving  History of mediastinal lymphadenopathy - Status post biopsy in the past which was negative. Outpatient follow-up with oncology. - CAT scan did not show significant mediastinal lymphadenopathy  Chronic generalized pain - Lidoderm patch not helping. Will try tramadol. PT/OT eval. Patient had a PET scan done in August 2018 which did not show any evidence of skeletal metastases to account for his chronic upper and lower back pain. - continue PT  History of COPD - Stable  Hypomagnesemia - improved  Generalized deconditioning - PT/OT eval. Care management consult   DVT prophylaxis: xarelto Code Status:  Full Family Communication: discussed with wife at bedside Disposition Plan:depends on clinical outcome  Consultants: Cardiology  Procedures:  Study Conclusions  - Left ventricle: The cavity size was normal. There was moderate   concentric hypertrophy. Systolic function was normal. The   estimated ejection fraction was in the range of 60% to 65%. Wall   motion was normal; there were no regional wall motion   abnormalities. The study was not technically sufficient to allow   evaluation of LV diastolic dysfunction due to atrial   fibrillation. - Aortic valve: Trileaflet; normal  thickness, mildly calcified   leaflets. There was trivial regurgitation. - Mitral valve: Calcified annulus. There was mild regurgitation. - Left atrium:  The atrium was mildly dilated. - Right ventricle: The cavity size was moderately dilated. Wall   thickness was normal. - Right atrium: The atrium was massively dilated. - Tricuspid valve: There was moderate regurgitation. - Pulmonic valve: There was trivial regurgitation. - Pulmonary arteries: PA peak pressure: 51 mm Hg (S).  Left-sided thoracentesis and removal of 1.2 L fluid on 02/22/2017  Antimicrobials: Vancomycin from 02/23/2017 onwards   Subjective: Patient seen and examined at bedside.he feels slightly better than yesterday. He states that he is appetite has significantly improved. His shortness of breath is improving but is still significantly short of breath on exertion. He denies any current dizziness or chest pain. No Overnight fever, nausea or vomiting. He is still complaining of back pain.  Objective: Vitals:   02/24/17 1219 02/24/17 1642 02/24/17 1937 02/25/17 0349  BP: (!) 89/62 105/65 (!) 98/58 (!) 95/50  Pulse: (!) 51 70 76 73  Resp: _0 Temp: 98.1 F (36.7 C)  98.4 F (36.9 C) 98.2 F (36.8 C)  TempSrc: Oral  Oral Oral  SpO2: 97%  100% 98%  Weight:    69.7 kg (153 lb 11.2 oz)  Height:        Intake/Output Summary (Last 24 hours) at 02/25/17 0940 Last data filed at 02/24/17 2300  Gross per 24 hour  Intake              600 ml  Output             3225 ml  Net            -2625 ml   Filed Weights   02/23/17 0503 02/24/17 0527 02/25/17 0349  Weight: 73.3 kg (161 lb 9.6 oz) 71.8 kg (158 lb 6.4 oz) 69.7 kg (153 lb 11.2 oz)    Examination:  General exam: currently in no distress  Respiratory system: Bilateral decreased breath sound at bases With basilar crackles  Cardiovascular system: S1 & S2 heard,rate controlled   Gastrointestinal system: Abdomen is nondistended, soft and nontender. Normal bowel sounds heard. Extremities: No cyanosis, clubbing; Trace edema bilaterally; left upper extremity edema    Data Reviewed: I have personally reviewed  following labs and imaging studies  CBC:  Recent Labs Lab 02/21/17 1637 02/22/17 0438 02/23/17 0445 02/24/17 0516  WBC 8.4 8.2 8.0 7.9  NEUTROABS  --   --  4.0 4.2  HGB 12.7* 10.5* 10.3* 9.7*  HCT 38.7* 32.6* 32.7* 30.5*  MCV 93.0 92.4 92.6 92.4  PLT 333 326 318 099   Basic Metabolic Panel:  Recent Labs Lab 02/22/17 0438 02/22/17 1217 02/23/17 0445 02/24/17 0516 02/25/17 0504  NA 138 136 137 137 135  K 4.0 3.9 4.2 3.7 3.6  CL 104 103 105 100* 98*  CO2 _1 GLUCOSE 88 88 89 85 97  BUN 24* 25* 24* 26* 27*  CREATININE 0.95 0.86 0.92 0.98 0.89  CALCIUM 8.1* 7.9* 7.9* 8.2* 8.0*  MG 1.4*  --  1.8 1.7 1.7  PHOS 3.9  --   --   --   --    GFR: Estimated Creatinine Clearance: 72.9 mL/min (by C-G formula based on SCr of 0.89 mg/dL). Liver Function Tests:  Recent Labs Lab 02/21/17 1637 02/22/17 0438 02/22/17 1217 02/23/17 0445  AST _2 ALT 16* 15* 14* 14*  ALKPHOS 215* 185* 183* 179*  BILITOT 0.6 0.6 0.4 0.5  PROT 6.0* 5.4* 5.5* 5.3*  ALBUMIN 1.7* 1.5* 1.6* 1.6*   No results for input(s): LIPASE, AMYLASE in the last 168 hours. No results for input(s): AMMONIA in the last 168 hours. Coagulation Profile: No results for input(s): INR, PROTIME in the last 168 hours. Cardiac Enzymes:  Recent Labs Lab 02/21/17 1637 02/21/17 2347 02/22/17 0438 02/22/17 1217  TROPONINI 0.06* 0.07* 0.07* 0.06*   BNP (last 3 results) No results for input(s): PROBNP in the last 8760 hours. HbA1C: No results for input(s): HGBA1C in the last 72 hours. CBG: No results for input(s): GLUCAP in the last 168 hours. Lipid Profile:  Recent Labs  02/24/17 1429  CHOL 115  HDL 39*  LDLCALC 66  TRIG 51  CHOLHDL 2.9   Thyroid Function Tests: No results for input(s): TSH, T4TOTAL, FREET4, T3FREE, THYROIDAB in the last 72 hours. Anemia Panel: No results for input(s): VITAMINB12, FOLATE, FERRITIN, TIBC, IRON, RETICCTPCT in the last 72 hours. Sepsis Labs: No  results for input(s): PROCALCITON, LATICACIDVEN in the last 168 hours.  Recent Results (from the past 240 hour(s))  Gram stain     Status: None   Collection Time: 02/22/17  3:36 PM  Result Value Ref Range Status   Specimen Description PLEURAL LEFT  Final   Special Requests NONE  Final   Gram Stain   Final    CYTOSPIN SMEAR WBC PRESENT,BOTH PMN AND MONONUCLEAR NO ORGANISMS SEEN    Report Status 02/23/2017 FINAL  Final  Culture, body fluid-bottle     Status: Abnormal (Preliminary result)   Collection Time: 02/22/17  3:36 PM  Result Value Ref Range Status   Specimen Description PLEURAL LEFT  Final   Special Requests NONE  Final   Gram Stain   Final    GRAM POSITIVE COCCI IN CLUSTERS AEROBIC BOTTLE ONLY CRITICAL RESULT CALLED TO, READ BACK BY AND VERIFIED WITH: L MCELWEE,RN AT 3202 02/23/17 BY L BENFIELD    Culture (A)  Final    STAPHYLOCOCCUS SPECIES (COAGULASE NEGATIVE) SUSCEPTIBILITIES TO FOLLOW    Report Status PENDING  Incomplete         Radiology Studies: No results found.      Scheduled Meds: . feeding supplement (PRO-STAT SUGAR FREE 64)  30 mL Oral TID BM  . folic acid  1 mg Oral Daily  . furosemide  80 mg Intravenous BID  . gabapentin  100 mg Oral QHS  . lidocaine  1 patch Transdermal Q24H  . midodrine  5 mg Oral TID WC  . multivitamin with minerals  1 tablet Oral Daily  . rivaroxaban  20 mg Oral Q supper  . simethicone  80 mg Oral Once  . sodium chloride flush  3 mL Intravenous Q12H  . thiamine  100 mg Oral Daily   Continuous Infusions: . sodium chloride    . vancomycin Stopped (02/24/17 2352)     LOS: 4 days        Aline August, MD [facility] Pager [phone]  If 7PM-7AM, please contact night-coverage [email or web address] Password [email or web address] 02/25/2017, 9:40 AM

## 2017-02-25 NOTE — Progress Notes (Signed)
PT Cancellation Note  Patient Details Name: [name] MRN: [id number] DOB: 05/06/1946   Cancelled Treatment:    Reason Eval/Treat Not Completed: Patient at procedure or test/unavailable transport present to take pt for X ray and cardioversion planned for today as well. PT will check on pt later as time allows.    [place] April, PTA Pager: [phone]   02/25/2017, 8:50 AM

## 2017-02-25 NOTE — Consult Note (Signed)
   [facility] Inpatient Consult   02/25/2017  [name] 31-Jul-1945 [id number]   Patient was assessed for [facility] for community services in the [employer]. Patient was previously active with [facility].  Met with patient and his wife, [name] at bedside regarding being restarted with [facility] services. A written consent form signed on file.  Patient endorses Wilfred Lacy, NP.  This office is listed to provide the post hospital transition of care calls and follow up.   Patient and wife did not feel that a follow up with [facility] is necessary at this time, as they are potentially working on home health care with [facility] as in the past.  They did accept a brochure, 24 hour nurse advise line magnet and contact information.    Of note, [facility] Care Management services does not replace or interfere with any services that are arranged by inpatient case management or social work. For additional questions or referrals please contact:  Natividad Brood, RN BSN [facility] Liaison  [phone] business mobile phone Toll free office [phone]

## 2017-02-25 NOTE — Progress Notes (Signed)
Lab called about 24 hour urine acquisitions. Lab not able to see order, paged MD to verify. Awaiting callback

## 2017-02-25 NOTE — Progress Notes (Signed)
Patient ID: [name], male   DOB: [birth year], 71 y.o.   MRN: [id number] P    Advanced Heart Failure Rounding Note  Primary Cardiologist: Radford Pax HF: (new) Johndaniel Catlin  Subjective:    Patient was admitted with fatigue and edema. Difficult to diurese due to low BP.  Marked hypoalbuminemia with poor po intake x months.  Urine protein/creatinine ratio 6.67 from 10/14, suggestive of nephrotic-range proteinuria.   Had left thoracentesis on 10/13 with 1.2 L out, looks like transudate but growing GPCs so started on vancomycin.  Has history of empyema on right. Looks now like coag negative Staph.   He remains in atrial fibrillation. SBP 90s-100s on midodrine.  Creatinine stable.  He diuresed well again yesterday and weight down again.  Breathing ok when walking in halls.    Echo: EF 60-65%, mild MR, moderately dilated RV, PASP 51 mmHg, moderate TR.   LUE [place]: No LUE DVT  Objective:   Weight Range: 153 lb 11.2 oz (69.7 kg) Body mass index is 21.44 kg/m.   Vital Signs:   Temp:  [98.1 F (36.7 C)-98.4 F (36.9 C)] 98.2 F (36.8 C) (10/16 0349) Pulse Rate:  [51-76] 73 (10/16 0349) Resp:  [18-19] 19 (10/16 0349) BP: (89-105)/(50-65) 95/50 (10/16 0349) SpO2:  [97 %-100 %] 98 % (10/16 0349) Weight:  [153 lb 11.2 oz (69.7 kg)] 153 lb 11.2 oz (69.7 kg) (10/16 0349) Last BM Date: 02/23/17  Weight change: Filed Weights   02/23/17 0503 02/24/17 0527 02/25/17 0349  Weight: 161 lb 9.6 oz (73.3 kg) 158 lb 6.4 oz (71.8 kg) 153 lb 11.2 oz (69.7 kg)    Intake/Output:   Intake/Output Summary (Last 24 hours) at 02/25/17 0950 Last data filed at 02/25/17 0948  Gross per 24 hour  Intake              600 ml  Output             3225 ml  Net            -2625 ml      Physical Exam    General: chronically ill-appearing Neck: JVP 8-9 cm, no thyromegaly or thyroid nodule.  Lungs: Decreased breath sounds at bases bilaterally.  CV: Nondisplaced PMI.  Heart irregular S1/S2, no S3/S4, no murmur.  1+  edema to knees bilaterally, 2+ left forearm edema.   Abdomen: Soft, nontender, no hepatosplenomegaly, no distention.  Skin: Intact without lesions or rashes.  Neurologic: Alert and oriented x 3.  Psych: Normal affect. Extremities: No clubbing or cyanosis.  HEENT: Normal.    Telemetry   Atrial fibrillation in 70s (personally reviewed)  Labs    CBC  Recent Labs  02/23/17 0445 02/24/17 0516  WBC 8.0 7.9  NEUTROABS 4.0 4.2  HGB 10.3* 9.7*  HCT 32.7* 30.5*  MCV 92.6 92.4  PLT 318 700   Basic Metabolic Panel  Recent Labs  02/24/17 0516 02/25/17 0504  NA 137 135  K 3.7 3.6  CL 100* 98*  CO2 29 30  GLUCOSE 85 97  BUN 26* 27*  CREATININE 0.98 0.89  CALCIUM 8.2* 8.0*  MG 1.7 1.7   Liver Function Tests  Recent Labs  02/22/17 1217 02/23/17 0445  AST 22 20  ALT 14* 14*  ALKPHOS 183* 179*  BILITOT 0.4 0.5  PROT 5.5* 5.3*  ALBUMIN 1.6* 1.6*   No results for input(s): LIPASE, AMYLASE in the last 72 hours. Cardiac Enzymes  Recent Labs  02/22/17 1217  TROPONINI 0.06*  BNP: BNP (last 3 results)  Recent Labs  11/19/16 0909 11/22/16 0318 02/21/17 1637  BNP 433.1* 678.9* 626.5*    ProBNP (last 3 results) No results for input(s): PROBNP in the last 8760 hours.   D-Dimer No results for input(s): DDIMER in the last 72 hours. Hemoglobin A1C No results for input(s): HGBA1C in the last 72 hours. Fasting Lipid Panel  Recent Labs  02/24/17 1429  CHOL 115  HDL 39*  LDLCALC 66  TRIG 51  CHOLHDL 2.9   Thyroid Function Tests No results for input(s): TSH, T4TOTAL, T3FREE, THYROIDAB in the last 72 hours.  Invalid input(s): FREET3  Other results:   Imaging    No results found.   Medications:     Scheduled Medications: . feeding supplement (PRO-STAT SUGAR FREE 64)  30 mL Oral TID BM  . folic acid  1 mg Oral Daily  . furosemide  80 mg Intravenous BID  . gabapentin  100 mg Oral QHS  . lidocaine  1 patch Transdermal Q24H  . midodrine  5  mg Oral TID WC  . multivitamin with minerals  1 tablet Oral Daily  . rivaroxaban  20 mg Oral Q supper  . simethicone  80 mg Oral Once  . sodium chloride flush  3 mL Intravenous Q12H  . sodium chloride flush  3 mL Intravenous Q12H  . thiamine  100 mg Oral Daily    Infusions: . sodium chloride    . sodium chloride    . vancomycin 1,000 mg (02/25/17 0940)    PRN Medications: acetaminophen **OR** acetaminophen, HYDROcodone-acetaminophen, ondansetron **OR** ondansetron (ZOFRAN) IV, sodium chloride flush, sodium chloride flush    Patient Profile   71 yo with persistent atrial fibrillation, chronic diastolic CHF with anasarca, hypoalbuminemia was admitted with CHF and hypotension.   Assessment/Plan   1. Acute on chronic diastolic CHF: Patient is volume overloaded but diuresis has been difficult due to low BP and hypoalbuminemia.  He has hypoalbuminemia and suspect a component of 3rd-spacing from low protein.  Still volume overloaded on exam with JVD, but suspect he has a significant component of 3rd-spacing as well from hypoalbuminemia that will be difficult to correct until the underlying problem is corrected.  Midodrine started given soft BP/orthostasis and Lasix increased to 80 mg IV bid.  Better diuresis, weight down again.  - Continue midodrine 5 mg tid.  - Continue current IV Lasix today, possibly to po tomorrow.  - Continue ted hose.  2. Hypoalbuminemia: With 3rd spacing.  Possibly due to poor po intake x months.  Has seen nutrition and is eating better.  However, spot urine protein/creatinine ratio was 6.67.  This is suggestive of nephrotic range proteinuria though not as specific for this as it was not an initial am urine.  Nephrology has seen, he is having a 24 hour urine collection for protein.  If nephrotic range, will need renal biopsy.  3. Anorexia: ?Etiology.  Has history of prostate cancer and has been noted to have enlarged mediastinal nodes, but so far workup of  lymphadenopathy has not been revealing of cancer.  4. Atrial fibrillation: Persistent,  in atrial fibrillation today.  Would be reasonable to attempt cardioversion after diuresis as he was recently in NSR.  Will continue Xarelto.  - DCCV tomorrow.  Discussed risks/benefits with patient and he agrees to proceed.  5. Pleural effusions: Bilateral, partially loculated on right.  Had left thoracentesis, fluid appeared to be a transudate but growing GPCs => coag negative Staph, likely contaminant.  No fever/normal WBCs, think we can stop vancomycin.   Length of Stay: [place], MD  02/25/2017, 9:50 AM  Advanced Heart Failure Team Pager [phone] (M-F; 7a - 4p)  Please contact [facility] for night-coverage after hours (4p -7a ) and weekends on [email or web address]

## 2017-02-25 NOTE — Progress Notes (Signed)
Called materials for compression stocking per patient requests for L upper extremity. Unavailable at this time. Will inform MD

## 2017-02-26 ENCOUNTER — Institutional Professional Consult (permissible substitution): Payer: Medicare Other | Admitting: Pulmonary Disease

## 2017-02-26 ENCOUNTER — Inpatient Hospital Stay (HOSPITAL_COMMUNITY): Payer: Medicare Other | Admitting: Anesthesiology

## 2017-02-26 ENCOUNTER — Ambulatory Visit: Payer: Self-pay | Admitting: *Deleted

## 2017-02-26 ENCOUNTER — Encounter (HOSPITAL_COMMUNITY): Payer: Self-pay | Admitting: *Deleted

## 2017-02-26 ENCOUNTER — Encounter (HOSPITAL_COMMUNITY)
Admission: EM | Disposition: A | Discharge status: Home Health | Payer: Self-pay | Source: Home / Self Care | Attending: Internal Medicine

## 2017-02-26 DIAGNOSIS — I950 Idiopathic hypotension: Secondary | ICD-10-CM

## 2017-02-26 DIAGNOSIS — M7989 Other specified soft tissue disorders: Secondary | ICD-10-CM

## 2017-02-26 DIAGNOSIS — R918 Other nonspecific abnormal finding of lung field: Secondary | ICD-10-CM

## 2017-02-26 DIAGNOSIS — I5033 Acute on chronic diastolic (congestive) heart failure: Secondary | ICD-10-CM

## 2017-02-26 DIAGNOSIS — R0603 Acute respiratory distress: Secondary | ICD-10-CM

## 2017-02-26 DIAGNOSIS — I4891 Unspecified atrial fibrillation: Secondary | ICD-10-CM

## 2017-02-26 DIAGNOSIS — R0600 Dyspnea, unspecified: Secondary | ICD-10-CM

## 2017-02-26 DIAGNOSIS — J449 Chronic obstructive pulmonary disease, unspecified: Secondary | ICD-10-CM

## 2017-02-26 HISTORY — PX: CARDIOVERSION: SHX1299

## 2017-02-26 LAB — URINALYSIS, COMPLETE (UACMP) WITH MICROSCOPIC
Bilirubin Urine: NEGATIVE
GLUCOSE, UA: NEGATIVE mg/dL
Hgb urine dipstick: NEGATIVE
KETONES UR: NEGATIVE mg/dL
Leukocytes, UA: NEGATIVE
Nitrite: NEGATIVE
SPECIFIC GRAVITY, URINE: 1.023 (ref 1.005–1.030)
Squamous Epithelial / LPF: NONE SEEN
pH: 6 (ref 5.0–8.0)

## 2017-02-26 LAB — CBC WITH DIFFERENTIAL/PLATELET
BASOS ABS: 0 10*3/uL (ref 0.0–0.1)
Basophils Relative: 0 %
EOS PCT: 1 %
Eosinophils Absolute: 0.1 10*3/uL (ref 0.0–0.7)
HEMATOCRIT: 31.3 % — AB (ref 39.0–52.0)
Hemoglobin: 9.9 g/dL — ABNORMAL LOW (ref 13.0–17.0)
LYMPHS ABS: 2.6 10*3/uL (ref 0.7–4.0)
LYMPHS PCT: 28 %
MCH: 29.3 pg (ref 26.0–34.0)
MCHC: 31.6 g/dL (ref 30.0–36.0)
MCV: 92.6 fL (ref 78.0–100.0)
MONO ABS: 1.8 10*3/uL — AB (ref 0.1–1.0)
Monocytes Relative: 20 %
NEUTROS ABS: 4.7 10*3/uL (ref 1.7–7.7)
Neutrophils Relative %: 51 %
PLATELETS: 239 10*3/uL (ref 150–400)
RBC: 3.38 MIL/uL — AB (ref 4.22–5.81)
RDW: 16.1 % — AB (ref 11.5–15.5)
WBC: 9.2 10*3/uL (ref 4.0–10.5)

## 2017-02-26 LAB — BASIC METABOLIC PANEL
Anion gap: 6 (ref 5–15)
BUN: 32 mg/dL — AB (ref 6–20)
CO2: 33 mmol/L — ABNORMAL HIGH (ref 22–32)
CREATININE: 0.93 mg/dL (ref 0.61–1.24)
Calcium: 8 mg/dL — ABNORMAL LOW (ref 8.9–10.3)
Chloride: 96 mmol/L — ABNORMAL LOW (ref 101–111)
GFR calc Af Amer: >60 mL/min (ref >60)
GLUCOSE: 90 mg/dL (ref 65–99)
POTASSIUM: 4 mmol/L (ref 3.5–5.1)
SODIUM: 135 mmol/L (ref 135–145)

## 2017-02-26 LAB — IFE+PROTEIN ELECTRO, 24-HR UR
% BETA, URINE: 7.7 %
ALPHA 1 URINE: 8.5 %
ALPHA 2 UR: 8.2 %
Albumin, U: 68.5 %
GAMMA GLOBULIN URINE: 7 %
M-SPIKE %, URINE: 1.9 % — AB
M-SPIKE, MG/24 HR: 118 mg/(24.h) — AB
TOTAL PROTEIN, URINE-UPE24: 163.9 mg/dL
Total Protein, Urine-Ur/day: 6228 mg/24 hr — ABNORMAL HIGH (ref 30–150)
Total Volume: 3800

## 2017-02-26 LAB — PROTEIN / CREATININE RATIO, URINE
Creatinine, Urine: 111 mg/dL
PROTEIN CREATININE RATIO: 5.32 mg/mg{creat} — AB (ref 0.00–0.15)
Total Protein, Urine: 590 mg/dL

## 2017-02-26 LAB — GLOMERULAR BASEMENT MEMBRANE ANTIBODIES: GBM Ab: 4 units (ref 0–20)

## 2017-02-26 LAB — APTT: aPTT: 35 seconds (ref 24–36)

## 2017-02-26 LAB — MAGNESIUM: Magnesium: 1.7 mg/dL (ref 1.7–2.4)

## 2017-02-26 LAB — ALBUMIN: Albumin: 1.7 g/dL — ABNORMAL LOW (ref 3.5–5.0)

## 2017-02-26 SURGERY — CARDIOVERSION
Anesthesia: General

## 2017-02-26 MED ORDER — HYDROCORTISONE 1 % EX CREA
1.0000 "application " | TOPICAL_CREAM | Freq: Four times a day (QID) | CUTANEOUS | Status: DC | PRN
  Administered 2017-02-26 – 2017-03-01 (×4): 1 via TOPICAL
  Filled 2017-02-26 (×2): qty 28

## 2017-02-26 MED ORDER — HEPARIN (PORCINE) IN NACL 100-0.45 UNIT/ML-% IJ SOLN
2200.0000 [IU]/h | INTRAMUSCULAR | Status: DC
  Administered 2017-02-26: 1800 [IU]/h via INTRAVENOUS
  Filled 2017-02-26 (×2): qty 250

## 2017-02-26 MED ORDER — MAGNESIUM SULFATE 2 GM/50ML IV SOLN
2.0000 g | Freq: Once | INTRAVENOUS | Status: AC
Start: 2017-02-26 — End: 2017-02-26
  Administered 2017-02-26: 2 g via INTRAVENOUS
  Filled 2017-02-26: qty 50

## 2017-02-26 MED ORDER — SODIUM CHLORIDE 0.9 % IV SOLN
INTRAVENOUS | Status: DC | PRN
  Administered 2017-02-26 (×2): via INTRAVENOUS

## 2017-02-26 MED ORDER — PHENYLEPHRINE HCL 10 MG/ML IJ SOLN
INTRAMUSCULAR | Status: DC | PRN
  Administered 2017-02-26: 80 ug via INTRAVENOUS
  Administered 2017-02-26 (×2): 120 ug via INTRAVENOUS

## 2017-02-26 MED ORDER — PROPOFOL 10 MG/ML IV BOLUS
INTRAVENOUS | Status: DC | PRN
  Administered 2017-02-26: 40 mg via INTRAVENOUS
  Administered 2017-02-26: 60 mg via INTRAVENOUS

## 2017-02-26 MED ORDER — LIDOCAINE HCL (CARDIAC) 20 MG/ML IV SOLN
INTRAVENOUS | Status: DC | PRN
  Administered 2017-02-26: 40 mg via INTRAVENOUS

## 2017-02-26 NOTE — Progress Notes (Signed)
ANTICOAGULATION CONSULT NOTE - Initial Consult  Pharmacy Consult for heparin Indication: atrial fibrillation  Allergies  Allergen Reactions  . Demerol [Meperidine] Other (See Comments)    UNSPECIFIED REACTION  Causes system to shutdown. ?   . Oxycodone Hcl Other (See Comments)    Pt states this medication 'wires him up' and makes pt hyper; pt does not want to take this ever again    Patient Measurements: Height: _0  (180.3 cm) Weight: 151 lb 14.4 oz (68.9 kg) IBW/kg (Calculated) : 75.3 Heparin Dosing Weight: 70kg  Vital Signs: Temp: 97.6 F (36.4 C) (10/17 1416) Temp Source: Oral (10/17 1416) BP: 108/61 (10/17 1416) Pulse Rate: 88 (10/17 1416)  Labs:  Recent Labs  02/24/17 0516 02/25/17 0504 02/26/17 0449  HGB 9.7*  --  9.9*  HCT 30.5*  --  31.3*  PLT 271  --  239  CREATININE 0.98 0.89 0.93    Estimated Creatinine Clearance: 68.9 mL/min (by C-G formula based on SCr of 0.93 mg/dL).   Medical History: Past Medical History:  Diagnosis Date  . Anxiety   . Arthritis    "neck" (08/15/2015)  . Chronic bronchitis ([place])   . Chronic diastolic CHF (congestive heart failure) ([place])   . Constipation   . COPD (chronic obstructive pulmonary disease) ([place])   . DDD (degenerative disc disease), cervical   . Depression   . Dilated aortic root ([place])    63m on echo 05/2016  . Edema of left lower extremity   . Facial basal cell cancer   . H/O acne vulgaris 1960s   [facility]  . Headache    history of - left temporal- years ago- not current (08/15/2015)  . Persistent atrial fibrillation (HCC)    on coumadin with CHADS2VASC score of 2  . Pneumonia 1960s; 2015 X 2  . [place] 2000[place]   "low spreading; non aggressive type" (08/15/2015)  . [place]    PASP 427mg by echo 05/2016  . Skin cancer    "back"   Assessment: [age]ear old male with afib now s/p successful dccv this afternoon. Patient has been  on xarelto since prior to admission, his last dose was last night ~1700. New plans noted for renal biopsy Friday by IR, xarelto was stopped in anticipation. D/w cardiology, given new dccv will bridge with heparin.   Note that patient had rather large heparin requirements back in January of around 2400 units/hr with still undetectable levels at that time. Will start at moderate rate and titrate up rapidly as indicated.   Goal of Therapy:  Heparin level 0.3-0.7 units/ml Monitor platelets by anticoagulation protocol: Yes   Plan:  Start heparin infusion at 1800 units/hr Check anti-Xa level in 8 hours and daily while on heparin Continue to monitor H&H and platelets  FrErin HearingharmD., BCPS Clinical Pharmacist Pager [phone]/17/2018 3:12 PM

## 2017-02-26 NOTE — Procedures (Signed)
Electrical Cardioversion Procedure Note [name] [id number] 03-23-1946  Procedure: Electrical Cardioversion Indications:  Atrial Fibrillation  Procedure Details Consent: Risks of procedure as well as the alternatives and risks of each were explained to the (patient/caregiver).  Consent for procedure obtained. Time Out: Verified patient identification, verified procedure, site/side was marked, verified correct patient position, special equipment/implants available, medications/allergies/relevent history reviewed, required imaging and test results available.  Performed  Patient placed on cardiac monitor, pulse oximetry, supplemental oxygen as necessary.  Sedation given: Propofol Pacer pads placed anterior and posterior chest.  Cardioverted 2 time(s).  Cardioverted at [place].  Evaluation Findings: Post procedure EKG shows: NSR Complications: None Patient did tolerate procedure well.   [name] 02/26/2017, 12:56 PM

## 2017-02-26 NOTE — Progress Notes (Signed)
PT Cancellation Note  Patient Details Name: [name] MRN: [id number] DOB: [birth year]   Cancelled Treatment:    Reason Eval/Treat Not Completed: Pain limiting ability to participate;Fatigue/lethargy limiting ability to participate.  Will return at later date.   Despina Pole 02/26/2017, 7:04 PM Carita Pian. Sanjuana Kava, Guadalupe Guerra Pager [phone]

## 2017-02-26 NOTE — Progress Notes (Signed)
S: continues to feel better, having good urine output, peripheral edema is improving  O:BP (!) 93/48 (BP Location: Right Arm)   Pulse 74   Temp 97.9 F (36.6 C) (Oral)   Resp 18   Ht 5' 11" (1.803 m)   Wt 151 lb 14.4 oz (68.9 kg)   SpO2 98%   BMI 21.19 kg/m   Intake/Output Summary (Last 24 hours) at 02/26/17 1109 Last data filed at 02/26/17 0907  Gross per 24 hour  Intake              483 ml  Output              950 ml  Net             -467 ml   Intake/Output: I/O last 3 completed shifts: In: 1003 [P.O.:600; I.V.:3; IV Piggyback:400] Out: 2450 [Urine:2450]  Intake/Output this shift:  Total I/O In: 3 [I.V.:3] Out: -  Weight change: -1 lb 12.8 oz (-0.816 kg) Gen: thin elder man  CVS: irregular rhythm, 1+ dependent edema in b/l upper and lower extremities Resp:clear to auscultation  ZTI:WPYK nontender nondistended DXI:[id number] stockings in place over bilateral lower extremities   Recent Labs Lab 02/21/17 1637 02/22/17 0438 02/22/17 1217 02/23/17 0445 02/24/17 0516 02/25/17 0504 02/26/17 0449  NA 137 138 136 137 137 135 135  K 4.3 4.0 3.9 4.2 3.7 3.6 4.0  CL 103 104 103 105 100* 98* 96*  CO2 _0 33*  GLUCOSE 107* 88 88 89 85 97 90  BUN 26* 24* 25* 24* 26* 27* 32*  CREATININE 0.95 0.95 0.86 0.92 0.98 0.89 0.93  ALBUMIN 1.7* 1.5* 1.6* 1.6*  --   --  1.7*  CALCIUM 8.4* 8.1* 7.9* 7.9* 8.2* 8.0* 8.0*  PHOS  --  3.9  --   --   --   --   --   AST _1 --   --   --   ALT 16* 15* 14* 14*  --   --   --    Liver Function Tests:  Recent Labs Lab 02/22/17 0438 02/22/17 1217 02/23/17 0445 02/26/17 0449  AST _2 --   ALT 15* 14* 14*  --   ALKPHOS 185* 183* 179*  --   BILITOT 0.6 0.4 0.5  --   PROT 5.4* 5.5* 5.3*  --   ALBUMIN 1.5* 1.6* 1.6* 1.7*   No results for input(s): LIPASE, AMYLASE in the last 168 hours. No results for input(s): AMMONIA in the last 168 hours. CBC:  Recent Labs Lab 02/21/17 1637 02/22/17 0438  02/23/17 0445 02/24/17 0516 02/26/17 0449  WBC 8.4 8.2 8.0 7.9 9.2  NEUTROABS  --   --  4.0 4.2 4.7  HGB 12.7* 10.5* 10.3* 9.7* 9.9*  HCT 38.7* 32.6* 32.7* 30.5* 31.3*  MCV 93.0 92.4 92.6 92.4 92.6  PLT 333 326 318 271 239   Cardiac Enzymes:  Recent Labs Lab 02/21/17 1637 02/21/17 2347 02/22/17 0438 02/22/17 1217  TROPONINI 0.06* 0.07* 0.07* 0.06*   CBG: No results for input(s): GLUCAP in the last 168 hours.  Iron Studies: No results for input(s): IRON, TIBC, TRANSFERRIN, FERRITIN in the last 72 hours. Studies/Results: Dg Chest 2 View  Result Date: 02/25/2017 CLINICAL DATA:  Shortness of breath, back pain. EXAM: CHEST  2 VIEW COMPARISON:  02/22/2017 FINDINGS: There is small to moderate right pleural effusion. Cardiomegaly with diffuse interstitial and likely early alveolar opacities,  right greater than left. Favor edema/ CHF. Slight interval worsening since prior study. No acute bony abnormality. IMPRESSION: Worsening small to moderate right pleural effusion and diffuse interstitial and early alveolar opacities, likely edema/CHF. Electronically Signed   By: Rolm Baptise M.D.   On: 02/25/2017 09:50   Dg Chest Bilateral Decubitus  Result Date: 02/25/2017 CLINICAL DATA:  Shortness of breath. EXAM: CHEST - BILATERAL DECUBITUS VIEW COMPARISON:  Chest x-ray today. FINDINGS: Moderate right pleural effusion which appears to remain present laterally on the left decubitus view suggesting loculation. No layering left pleural effusion. No pneumothorax. IMPRESSION: Moderate right pleural effusion, likely at least partially loculated laterally. Electronically Signed   By: Rolm Baptise M.D.   On: 02/25/2017 09:52   . feeding supplement (PRO-STAT SUGAR FREE 64)  30 mL Oral TID BM  . folic acid  1 mg Oral Daily  . furosemide  80 mg Intravenous Daily  . gabapentin  100 mg Oral QHS  . lidocaine  1 patch Transdermal Q24H  . midodrine  5 mg Oral TID WC  . multivitamin with minerals  1 tablet  Oral Daily  . rivaroxaban  20 mg Oral Q supper  . simethicone  80 mg Oral Once  . sodium chloride flush  3 mL Intravenous Q12H  . sodium chloride flush  3 mL Intravenous Q12H  . thiamine  100 mg Oral Daily    BMET    Component Value Date/Time   NA 135 02/26/2017 0449   NA 139 01/09/2017 1256   K 4.0 02/26/2017 0449   CL 96 (L) 02/26/2017 0449   CO2 33 (H) 02/26/2017 0449   GLUCOSE 90 02/26/2017 0449   BUN 32 (H) 02/26/2017 0449   BUN 26 01/09/2017 1256   CREATININE 0.93 02/26/2017 0449   CREATININE 0.84 06/12/2014 0922   CALCIUM 8.0 (L) 02/26/2017 0449   GFRNONAA >60 02/26/2017 0449   GFRAA >60 02/26/2017 0449   CBC    Component Value Date/Time   WBC 9.2 02/26/2017 0449   RBC 3.38 (L) 02/26/2017 0449   HGB 9.9 (L) 02/26/2017 0449   HGB 13.8 10/04/2016 1134   HCT 31.3 (L) 02/26/2017 0449   HCT 41.5 10/04/2016 1134   PLT 239 02/26/2017 0449   PLT 469 (H) 10/04/2016 1134   MCV 92.6 02/26/2017 0449   MCV 90 10/04/2016 1134   MCH 29.3 02/26/2017 0449   MCHC 31.6 02/26/2017 0449   RDW 16.1 (H) 02/26/2017 0449   RDW 14.9 10/04/2016 1134   LYMPHSABS 2.6 02/26/2017 0449   MONOABS 1.8 (H) 02/26/2017 0449   EOSABS 0.1 02/26/2017 0449   BASOSABS 0.0 02/26/2017 0449     Assessment/Plan:  1. Nephrotic range Proteinuria  - urinalysis with 3+ protein and first void protein crt ratio consistent with nephrotic range proteinuria, will plan for kidney biopsy through IR either tomorrow or Friday, will need to hold anticoagulation prior to this procedure, IR consulted  - SPEP positive for gamma protein spike, kidney biopsy and urine IFE will give more information regarding this  - follow up 24 hour urine protein   2. Volume overload  - significant improvement with peripheral edema - net -567 cc out yesterday and now with contraction alkylosis, the volume overload is extravascular  - lasix decreased from 80 BID to once daily, agree with switching this to PO today   3. Acute on  chronic congestive heart failure- received IV albumin, midodrine, and lasix, heart failure is managing  4. Pleural effusion - transudate  with GPCs are though to be contaminant, vanc stopped, afebrile and without leukocytosis, CXR yesterday did show worsening despite thoracentesis 10/13 with 1.2 L out 5.  persistent afib - DCCV planned for today, on xarelto  6. Chronic Hypotension - improving, asymptomatic   Ledell Noss, MD  PGY2 IMTS   I have seen and examined this patient and agree with plan and assessment in the above note with renal recommendations/intervention highlighted.  Will consult IR for US-guided renal biopsy in light of nephrotic range proteinuria and M spike. Governor Rooks Luvenia Cranford,MD 02/26/2017 12:10 PM   Donetta Potts, MD [facility] [phone]

## 2017-02-26 NOTE — Interval H&P Note (Signed)
History and Physical Interval Note:  02/26/2017 12:43 PM  [name]  has presented today for surgery, with the diagnosis of a fib  The various methods of treatment have been discussed with the patient and family. After consideration of risks, benefits and other options for treatment, the patient has consented to  Procedure(s): CARDIOVERSION (N/A) as a surgical intervention .  The patient's history has been reviewed, patient examined, no change in status, stable for surgery.  I have reviewed the patient's chart and labs.  Questions were answered to the patient's satisfaction.     Arthelia Callicott [employer]

## 2017-02-26 NOTE — Progress Notes (Signed)
OT Cancellation Note  Patient Details Name: AMED DATTA MRN: [id number] DOB: 1945/11/09   Cancelled Treatment:    Reason Eval/Treat Not Completed: Patient at procedure or test/ unavailable (Endo) Will follow.  Malka So 02/26/2017, 11:46 AM  02/26/2017 Nestor Lewandowsky, OTR/L Pager: [phone]

## 2017-02-26 NOTE — Progress Notes (Signed)
Patient ID: [name], male   DOB: December 22, 1945, 71 y.o.   MRN: [id number] P    Advanced Heart Failure Rounding Note  Primary Cardiologist: [name] HF: (new) [name]  Subjective:    Patient was admitted with fatigue and edema. Difficult to diurese due to low BP.  Marked hypoalbuminemia with poor po intake x months.  Urine protein/creatinine ratio 6.67 from 10/14, suggestive of nephrotic-range proteinuria.   Had left thoracentesis on 10/13 with 1.2 L out, looks like transudate but growing GPCs so started on vancomycin.  Has history of empyema on right. Looks now like coag negative Staph.   Remains in Afib. SBPs 80-90s on midodrine. Creatinine stable. Weight trending down.   Feeling OK. Tired and anxious to feel better and go home. Mild lightheadedness with standing. Swelling still present but improving.   Negative 500 cc. Down 2 lbs.   Echo: EF 60-65%, mild MR, moderately dilated RV, PASP 51 mmHg, moderate TR.   LUE [place]: No LUE DVT  Objective:   Weight Range: 151 lb 14.4 oz (68.9 kg) Body mass index is 21.19 kg/m.   Vital Signs:   Temp:  [97.9 F (36.6 C)-98.1 F (36.7 C)] 97.9 F (36.6 C) (10/17 0557) Pulse Rate:  [71-78] 74 (10/17 0557) Resp:  [16-20] 18 (10/17 0557) BP: (84-96)/(51-77) 90/77 (10/17 0557) SpO2:  [95 %-98 %] 98 % (10/17 0557) Weight:  [151 lb 14.4 oz (68.9 kg)] 151 lb 14.4 oz (68.9 kg) (10/17 0557) Last BM Date: 02/23/17  Weight change: Filed Weights   02/24/17 0527 02/25/17 0349 02/26/17 0557  Weight: 158 lb 6.4 oz (71.8 kg) 153 lb 11.2 oz (69.7 kg) 151 lb 14.4 oz (68.9 kg)    Intake/Output:   Intake/Output Summary (Last 24 hours) at 02/26/17 0800 Last data filed at 02/26/17 0118  Gross per 24 hour  Intake              883 ml  Output             1450 ml  Net             -567 ml      Physical Exam    General: Chronically ill-appearing. NAD.  HEENT: Normal Neck: Supple. JVP 8-9. Carotids 2+ bilat; no bruits. No thyromegaly or nodule  noted. Cor: PMI nondisplaced. Irregularly irregular.  Lungs: CTAB, normal effort. Abdomen: Soft, non-tender, non-distended, no HSM. No bruits or masses. +BS  Extremities: No cyanosis, clubbing, or rash. Trace to 1+ edema to knees. 1-2+ left forearm edema.  Neuro: Alert & orientedx3, cranial nerves grossly intact. moves all 4 extremities w/o difficulty. Affect pleasant   Telemetry   Atrial fibrillation 70-80s, Personally reviewed.   Labs    CBC  Recent Labs  02/24/17 0516 02/26/17 0449  WBC 7.9 9.2  NEUTROABS 4.2 4.7  HGB 9.7* 9.9*  HCT 30.5* 31.3*  MCV 92.4 92.6  PLT 271 915   Basic Metabolic Panel  Recent Labs  02/25/17 0504 02/26/17 0449  NA 135 135  K 3.6 4.0  CL 98* 96*  CO2 30 33*  GLUCOSE 97 90  BUN 27* 32*  CREATININE 0.89 0.93  CALCIUM 8.0* 8.0*  MG 1.7 1.7   Liver Function Tests  Recent Labs  02/26/17 0449  ALBUMIN 1.7*   No results for input(s): LIPASE, AMYLASE in the last 72 hours. Cardiac Enzymes No results for input(s): CKTOTAL, CKMB, CKMBINDEX, TROPONINI in the last 72 hours.  BNP: BNP (last 3 results)  Recent Labs  11/19/16 0909 11/22/16 0318 02/21/17 1637  BNP 433.1* 678.9* 626.5*    ProBNP (last 3 results) No results for input(s): PROBNP in the last 8760 hours.   D-Dimer No results for input(s): DDIMER in the last 72 hours. Hemoglobin A1C No results for input(s): HGBA1C in the last 72 hours. Fasting Lipid Panel  Recent Labs  02/24/17 1429  CHOL 115  HDL 39*  LDLCALC 66  TRIG 51  CHOLHDL 2.9   Thyroid Function Tests No results for input(s): TSH, T4TOTAL, T3FREE, THYROIDAB in the last 72 hours.  Invalid input(s): FREET3  Other results:   Imaging    Dg Chest 2 View  Result Date: 02/25/2017 CLINICAL DATA:  Shortness of breath, back pain. EXAM: CHEST  2 VIEW COMPARISON:  02/22/2017 FINDINGS: There is small to moderate right pleural effusion. Cardiomegaly with diffuse interstitial and likely early alveolar  opacities, right greater than left. Favor edema/ CHF. Slight interval worsening since prior study. No acute bony abnormality. IMPRESSION: Worsening small to moderate right pleural effusion and diffuse interstitial and early alveolar opacities, likely edema/CHF. Electronically Signed   By: [name] M.D.   On: 02/25/2017 09:50   Dg Chest Bilateral Decubitus  Result Date: 02/25/2017 CLINICAL DATA:  Shortness of breath. EXAM: CHEST - BILATERAL DECUBITUS VIEW COMPARISON:  Chest x-ray today. FINDINGS: Moderate right pleural effusion which appears to remain present laterally on the left decubitus view suggesting loculation. No layering left pleural effusion. No pneumothorax. IMPRESSION: Moderate right pleural effusion, likely at least partially loculated laterally. Electronically Signed   By: [name] M.D.   On: 02/25/2017 09:52     Medications:     Scheduled Medications: . feeding supplement (PRO-STAT SUGAR FREE 64)  30 mL Oral TID BM  . folic acid  1 mg Oral Daily  . furosemide  80 mg Intravenous Daily  . gabapentin  100 mg Oral QHS  . lidocaine  1 patch Transdermal Q24H  . midodrine  5 mg Oral TID WC  . multivitamin with minerals  1 tablet Oral Daily  . rivaroxaban  20 mg Oral Q supper  . simethicone  80 mg Oral Once  . sodium chloride flush  3 mL Intravenous Q12H  . sodium chloride flush  3 mL Intravenous Q12H  . thiamine  100 mg Oral Daily    Infusions: . sodium chloride    . sodium chloride      PRN Medications: acetaminophen **OR** acetaminophen, HYDROcodone-acetaminophen, ondansetron **OR** ondansetron (ZOFRAN) IV, sodium chloride flush, sodium chloride flush, traMADol    Patient Profile   71 yo with persistent atrial fibrillation, chronic diastolic CHF with anasarca, hypoalbuminemia was admitted with CHF and hypotension.   Assessment/Plan   1. Acute on chronic diastolic CHF: Patient is volume overloaded but diuresis has been difficult due to low BP and  hypoalbuminemia.  He has hypoalbuminemia and suspect a component of 3rd-spacing from low protein.   -  Midodrine started given soft BP/orthostasis and Lasix increased to 80 mg IV bid.  Weight continues to come down. BP remains soft.   - Volume status improving, but has significant component of 3rd spacing. (See #2) - Continue midodrine 5 mg tid.  - Ordered for one dose IV lasix 80 mg per renal. Would give 1 more day IV Lasix and transition to po tomorrow.  - Continue ted hose.  2. Hypoalbuminemia: With 3rd spacing.  Possibly due to poor po intake x months.  Has seen nutrition and is eating better.  However, spot  urine protein/creatinine ratio was 6.67 and repeated as an early am test => 5.32.  This is suggestive of nephrotic syndrome.  Protein on UA is also suggestive.  - Still pending 24 hour urine protein. - Plan for renal biopsy per nephrology for evaluation of nephrotic syndrome.   3. Anorexia: ?Etiology.  Has history of prostate cancer and has been noted to have enlarged mediastinal nodes, but so far workup of lymphadenopathy has not been revealing of cancer. No change. 4. Atrial fibrillation: Persistent,  in atrial fibrillation today.  Would be reasonable to attempt cardioversion after diuresis as he was recently in NSR.  Will continue Xarelto.  - DCCV this afternoon. Have previously discussed risks/benefits with patient and he agrees to proceed.  5. Pleural effusions:  - Bilateral, partially loculated on right.  Had left thoracentesis, fluid appeared to be a transudate but growing GPCs => coag negative Staph, likely contaminant.   - Afebrile. No WBC. Vancomycin stopped.  6. Hypomagnesemia - Will supp Mg with DCCV this afternoon.  7. Nephrotic syndrome: Associate with M-spike on SPEP.  ?AL amyloidosis versus multiple myeloma as cause of nephrotic syndrome. Moderate LVH on echo, could also have presence of cardiac amyloidosis.  To get renal biopsy, would make sure to stain for amyloid.    Length of Stay: [place], [place]  02/26/2017, 8:00 AM  Advanced Heart Failure Team Pager [phone] (M-F; 7a - 4p)  Please contact [facility] for night-coverage after hours (4p -7a ) and weekends on [email or web address]  Patient seen with PA, agree with the above note.   He was successfully cardioverted back to NSR today.   Probably mild central volume overload still but also has prominent 3rd-spacing from hypoalbuminemia/nephrotic syndrome.   He does appear to have nephrotic syndrome, plan for renal biopsy this week.    SPEP with M-spike. 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  Will have renal biopsy, will need amyloid staining.   [name] 02/26/2017 1:13 PM

## 2017-02-26 NOTE — Progress Notes (Signed)
PT Cancellation Note  Patient Details Name: [name] MRN: [id number] DOB: [birth year]   Cancelled Treatment:    Reason Eval/Treat Not Completed: Patient at procedure or test/unavailable.  Will return at later time.   Despina Pole 02/26/2017, 12:01 PM Carita Pian Sanjuana Kava, [place] Pager [phone]

## 2017-02-26 NOTE — Anesthesia Procedure Notes (Signed)
Procedure Name: General with mask airway Date/Time: 02/26/2017 12:45 PM Performed by: Jenne Campus Pre-anesthesia Checklist: Patient identified, Emergency Drugs available, Suction available, Patient being monitored and Timeout performed Patient Re-evaluated:Patient Re-evaluated prior to induction Oxygen Delivery Method: Ambu bag Preoxygenation: Pre-oxygenation with 100% oxygen

## 2017-02-26 NOTE — Anesthesia Postprocedure Evaluation (Signed)
Anesthesia Post Note  Patient: [name]  Procedure(s) Performed: CARDIOVERSION (N/A )     Patient location during evaluation: Endoscopy Anesthesia Type: General Level of consciousness: awake and alert Pain management: pain level controlled Vital Signs Assessment: post-procedure vital signs reviewed and stable Respiratory status: spontaneous breathing, nonlabored ventilation, respiratory function stable and patient connected to nasal cannula oxygen Cardiovascular status: blood pressure returned to baseline and stable Postop Assessment: no apparent nausea or vomiting Anesthetic complications: no    Last Vitals:  Vitals:   02/26/17 1330 02/26/17 1340  BP: (!) 88/52 97/65  Pulse:  83  Resp: 16 15  Temp:    SpO2: 100% 100%    Last Pain:  Vitals:   02/26/17 1340  TempSrc:   PainSc: 2                  Zafir Schauer,JAMES TERRILL

## 2017-02-26 NOTE — Anesthesia Preprocedure Evaluation (Addendum)
Anesthesia Evaluation  Patient identified by MRN, date of birth, ID band Patient awake    Reviewed: Allergy & Precautions, NPO status , Patient's Chart, lab work & pertinent test results  Airway Mallampati: II  TM Distance: >3 FB Neck ROM: Full    Dental no notable dental hx. (+) Poor Dentition, Dental Advisory Given   Pulmonary COPD, former smoker,     + decreased breath sounds      Cardiovascular + Peripheral Vascular Disease and +CHF   Rhythm:Irregular Rate:Normal     Neuro/Psych    GI/Hepatic negative GI ROS, Neg liver ROS,   Endo/Other    Renal/GU Renal disease     Musculoskeletal  (+) Arthritis ,   Abdominal   Peds  Hematology   Anesthesia Other Findings   Reproductive/Obstetrics                            Anesthesia Physical Anesthesia Plan  ASA: III  Anesthesia Plan: General   Post-op Pain Management:    Induction: Intravenous  PONV Risk Score and Plan: 2 and Ondansetron, Dexamethasone and Treatment may vary due to age or medical condition  Airway Management Planned: Simple Face Mask  Additional Equipment:   Intra-op Plan:   Post-operative Plan:   Informed Consent: I have reviewed the patients History and Physical, chart, labs and discussed the procedure including the risks, benefits and alternatives for the proposed anesthesia with the patient or authorized representative who has indicated his/her understanding and acceptance.   Dental advisory given  Plan Discussed with: CRNA  Anesthesia Plan Comments:         Anesthesia Quick Evaluation

## 2017-02-26 NOTE — H&P (View-Only) (Signed)
Patient ID: [name], male   DOB: 25-Mar-1946, 71 y.o.   MRN: [id number] P    Advanced Heart Failure Rounding Note  Primary Cardiologist: [name] HF: (new) [name]  Subjective:    Patient was admitted with fatigue and edema. Difficult to diurese due to low BP.  Marked hypoalbuminemia with poor po intake x months.  Urine protein/creatinine ratio 6.67 from 10/14, suggestive of nephrotic-range proteinuria.   Had left thoracentesis on 10/13 with 1.2 L out, looks like transudate but growing GPCs so started on vancomycin.  Has history of empyema on right. Looks now like coag negative Staph.   He remains in atrial fibrillation. SBP 90s-100s on midodrine.  Creatinine stable.  He diuresed well again yesterday and weight down again.  Breathing ok when walking in halls.    Echo: EF 60-65%, mild MR, moderately dilated RV, PASP 51 mmHg, moderate TR.   LUE [place]: No LUE DVT  Objective:   Weight Range: 153 lb 11.2 oz (69.7 kg) Body mass index is 21.44 kg/m.   Vital Signs:   Temp:  [98.1 F (36.7 C)-98.4 F (36.9 C)] 98.2 F (36.8 C) (10/16 0349) Pulse Rate:  [51-76] 73 (10/16 0349) Resp:  [18-19] 19 (10/16 0349) BP: (89-105)/(50-65) 95/50 (10/16 0349) SpO2:  [97 %-100 %] 98 % (10/16 0349) Weight:  [153 lb 11.2 oz (69.7 kg)] 153 lb 11.2 oz (69.7 kg) (10/16 0349) Last BM Date: 02/23/17  Weight change: Filed Weights   02/23/17 0503 02/24/17 0527 02/25/17 0349  Weight: 161 lb 9.6 oz (73.3 kg) 158 lb 6.4 oz (71.8 kg) 153 lb 11.2 oz (69.7 kg)    Intake/Output:   Intake/Output Summary (Last 24 hours) at 02/25/17 0950 Last data filed at 02/25/17 0948  Gross per 24 hour  Intake              600 ml  Output             3225 ml  Net            -2625 ml      Physical Exam    General: chronically ill-appearing Neck: JVP 8-9 cm, no thyromegaly or thyroid nodule.  Lungs: Decreased breath sounds at bases bilaterally.  CV: Nondisplaced PMI.  Heart irregular S1/S2, no S3/S4, no murmur.  1+  edema to knees bilaterally, 2+ left forearm edema.   Abdomen: Soft, nontender, no hepatosplenomegaly, no distention.  Skin: Intact without lesions or rashes.  Neurologic: Alert and oriented x 3.  Psych: Normal affect. Extremities: No clubbing or cyanosis.  HEENT: Normal.    Telemetry   Atrial fibrillation in 70s (personally reviewed)  Labs    CBC  Recent Labs  02/23/17 0445 02/24/17 0516  WBC 8.0 7.9  NEUTROABS 4.0 4.2  HGB 10.3* 9.7*  HCT 32.7* 30.5*  MCV 92.6 92.4  PLT 318 630   Basic Metabolic Panel  Recent Labs  02/24/17 0516 02/25/17 0504  NA 137 135  K 3.7 3.6  CL 100* 98*  CO2 29 30  GLUCOSE 85 97  BUN 26* 27*  CREATININE 0.98 0.89  CALCIUM 8.2* 8.0*  MG 1.7 1.7   Liver Function Tests  Recent Labs  02/22/17 1217 02/23/17 0445  AST 22 20  ALT 14* 14*  ALKPHOS 183* 179*  BILITOT 0.4 0.5  PROT 5.5* 5.3*  ALBUMIN 1.6* 1.6*   No results for input(s): LIPASE, AMYLASE in the last 72 hours. Cardiac Enzymes  Recent Labs  02/22/17 1217  TROPONINI 0.06*  BNP: BNP (last 3 results)  Recent Labs  11/19/16 0909 11/22/16 0318 02/21/17 1637  BNP 433.1* 678.9* 626.5*    ProBNP (last 3 results) No results for input(s): PROBNP in the last 8760 hours.   D-Dimer No results for input(s): DDIMER in the last 72 hours. Hemoglobin A1C No results for input(s): HGBA1C in the last 72 hours. Fasting Lipid Panel  Recent Labs  02/24/17 1429  CHOL 115  HDL 39*  LDLCALC 66  TRIG 51  CHOLHDL 2.9   Thyroid Function Tests No results for input(s): TSH, T4TOTAL, T3FREE, THYROIDAB in the last 72 hours.  Invalid input(s): FREET3  Other results:   Imaging    No results found.   Medications:     Scheduled Medications: . feeding supplement (PRO-STAT SUGAR FREE 64)  30 mL Oral TID BM  . folic acid  1 mg Oral Daily  . furosemide  80 mg Intravenous BID  . gabapentin  100 mg Oral QHS  . lidocaine  1 patch Transdermal Q24H  . midodrine  5  mg Oral TID WC  . multivitamin with minerals  1 tablet Oral Daily  . rivaroxaban  20 mg Oral Q supper  . simethicone  80 mg Oral Once  . sodium chloride flush  3 mL Intravenous Q12H  . sodium chloride flush  3 mL Intravenous Q12H  . thiamine  100 mg Oral Daily    Infusions: . sodium chloride    . sodium chloride    . vancomycin 1,000 mg (02/25/17 0940)    PRN Medications: acetaminophen **OR** acetaminophen, HYDROcodone-acetaminophen, ondansetron **OR** ondansetron (ZOFRAN) IV, sodium chloride flush, sodium chloride flush    Patient Profile   71 yo with persistent atrial fibrillation, chronic diastolic CHF with anasarca, hypoalbuminemia was admitted with CHF and hypotension.   Assessment/Plan   1. Acute on chronic diastolic CHF: Patient is volume overloaded but diuresis has been difficult due to low BP and hypoalbuminemia.  He has hypoalbuminemia and suspect a component of 3rd-spacing from low protein.  Still volume overloaded on exam with JVD, but suspect he has a significant component of 3rd-spacing as well from hypoalbuminemia that will be difficult to correct until the underlying problem is corrected.  Midodrine started given soft BP/orthostasis and Lasix increased to 80 mg IV bid.  Better diuresis, weight down again.  - Continue midodrine 5 mg tid.  - Continue current IV Lasix today, possibly to po tomorrow.  - Continue ted hose.  2. Hypoalbuminemia: With 3rd spacing.  Possibly due to poor po intake x months.  Has seen nutrition and is eating better.  However, spot urine protein/creatinine ratio was 6.67.  This is suggestive of nephrotic range proteinuria though not as specific for this as it was not an initial am urine.  Nephrology has seen, he is having a 24 hour urine collection for protein.  If nephrotic range, will need renal biopsy.  3. Anorexia: ?Etiology.  Has history of prostate cancer and has been noted to have enlarged mediastinal nodes, but so far workup of  lymphadenopathy has not been revealing of cancer.  4. Atrial fibrillation: Persistent,  in atrial fibrillation today.  Would be reasonable to attempt cardioversion after diuresis as he was recently in NSR.  Will continue Xarelto.  - DCCV tomorrow.  Discussed risks/benefits with patient and he agrees to proceed.  5. Pleural effusions: Bilateral, partially loculated on right.  Had left thoracentesis, fluid appeared to be a transudate but growing GPCs => coag negative Staph, likely contaminant.    No fever/normal WBCs, think we can stop vancomycin.   Length of Stay: [place], MD  02/25/2017, 9:50 AM  Advanced Heart Failure Team Pager [phone] (M-F; 7a - 4p)  Please contact [facility] for night-coverage after hours (4p -7a ) and weekends on [email or web address]

## 2017-02-26 NOTE — Transfer of Care (Signed)
Immediate Anesthesia Transfer of Care Note  Patient: [name]  Procedure(s) Performed: CARDIOVERSION (N/A )  Patient Location: Endoscopy Unit  Anesthesia Type:General  Level of Consciousness: oriented, sedated and patient cooperative  Airway & Oxygen Therapy: Patient Spontanous Breathing and Patient connected to nasal cannula oxygen  Post-op Assessment: Report given to RN and Post -op Vital signs reviewed and stable  Post vital signs: Reviewed  Last Vitals:  Vitals:   02/26/17 0913 02/26/17 1144  BP: (!) 93/48 99/66  Pulse:  76  Resp:  16  Temp:  36.6 C  SpO2:  96%    Last Pain:  Vitals:   02/26/17 1144  TempSrc: Oral  PainSc: 5       Patients Stated Pain Goal: 2 (46/56/81 2751)  Complications: No apparent anesthesia complications

## 2017-02-26 NOTE — Plan of Care (Signed)
Problem: Activity: Goal: Risk for activity intolerance will decrease Outcome: Progressing Will continue to encourage patient to ambulate.

## 2017-02-26 NOTE — Progress Notes (Signed)
1030 Offered to walk with pt but he stated he wants to wait until after procedure. He stated he had already talked with Mobility staff and he will walk with them later. Will continue to follow. Graylon Good RN BSN 02/26/2017 10:42 AM

## 2017-02-26 NOTE — Progress Notes (Signed)
PROGRESS NOTE  [name] VIF:[id number] DOB: 01-19-1946 DOA: 02/21/2017 PCP: Flossie Buffy, NP  HPI/Recap of past 24 hours: [name] is a 71 year old male with past medical history significant for paroxysmal A. fib on Xarelto, diastolic CHF with EF of 14-70% (05/14/16), COPD, pulmonary hypertension status post right VATS in May 20, 2016 due to empyema, history of lung mass, who presented on 02/21/17 with complaints of fatigue and generalized swelling. Patient was admitted for acute hypoxic respiratory failure, fluid overload with hypoalbuminemia due to protein calorie malnutrition.  Overnight, there were no acute events. Patient reports he feels better however he continues to have generalized fatigue and poor appetite. He denies any chest pain, palpitations, dyspnea, abdominal pain, melena or hematochezia. Hemoglobin dropped this morning from 12 to 9k.    Objective: Vitals:   02/25/17 2323 02/26/17 0557 02/26/17 0913 02/26/17 1144  BP: (!) 84/51 90/77 (!) 93/48 99/66  Pulse: 78 74  76  Resp: _0 Temp: 98.1 F (36.7 C) 97.9 F (36.6 C)  97.9 F (36.6 C)  TempSrc: Oral Oral  Oral  SpO2: 96% 98%  96%  Weight:  68.9 kg (151 lb 14.4 oz)    Height:        Intake/Output Summary (Last 24 hours) at 02/26/17 1251 Last data filed at 02/26/17 9295  Gross per 24 hour  Intake              483 ml  Output              950 ml  Net             -467 ml   Filed Weights   02/24/17 0527 02/25/17 0349 02/26/17 0557  Weight: 71.8 kg (158 lb 6.4 oz) 69.7 kg (153 lb 11.2 oz) 68.9 kg (151 lb 14.4 oz)    Exam:   General: A 71 year old Caucasian male, lying in bed, breathing at room air, appears in no acute distress.  Cardiovascular: Irregular rate and rhythm with no murmurs or rubs noted.  Trace edema in lower extremities bilaterally greater on the left.  Respiratory: Lungs are clear to auscultation bilaterally.  No wheezes or rhonchi noted.  Abdomen: Soft nontender  nondistended with normal bowel sounds.  Musculoskeletal: Strength intact in all 4 extremities.  Moves all limbs without any difficulties.  Skin: No moderate ulcerative lesions or rash  Psychiatry: Normal mood and affect for condition and circumstances   Data Reviewed: CBC:  Recent Labs Lab 02/21/17 1637 02/22/17 0438 02/23/17 0445 02/24/17 0516 02/26/17 0449  WBC 8.4 8.2 8.0 7.9 9.2  NEUTROABS  --   --  4.0 4.2 4.7  HGB 12.7* 10.5* 10.3* 9.7* 9.9*  HCT 38.7* 32.6* 32.7* 30.5* 31.3*  MCV 93.0 92.4 92.6 92.4 92.6  PLT 333 326 318 271 747   Basic Metabolic Panel:  Recent Labs Lab 02/22/17 0438 02/22/17 1217 02/23/17 0445 02/24/17 0516 02/25/17 0504 02/26/17 0449  NA 138 136 137 137 135 135  K 4.0 3.9 4.2 3.7 3.6 4.0  CL 104 103 105 100* 98* 96*  CO2 _1 33*  GLUCOSE 88 88 89 85 97 90  BUN 24* 25* 24* 26* 27* 32*  CREATININE 0.95 0.86 0.92 0.98 0.89 0.93  CALCIUM 8.1* 7.9* 7.9* 8.2* 8.0* 8.0*  MG 1.4*  --  1.8 1.7 1.7 1.7  PHOS 3.9  --   --   --   --   --  GFR: Estimated Creatinine Clearance: 68.9 mL/min (by C-G formula based on SCr of 0.93 mg/dL). Liver Function Tests:  Recent Labs Lab 02/21/17 1637 02/22/17 0438 02/22/17 1217 02/23/17 0445 02/26/17 0449  AST _0 --   ALT 16* 15* 14* 14*  --   ALKPHOS 215* 185* 183* 179*  --   BILITOT 0.6 0.6 0.4 0.5  --   PROT 6.0* 5.4* 5.5* 5.3*  --   ALBUMIN 1.7* 1.5* 1.6* 1.6* 1.7*   No results for input(s): LIPASE, AMYLASE in the last 168 hours. No results for input(s): AMMONIA in the last 168 hours. Coagulation Profile: No results for input(s): INR, PROTIME in the last 168 hours. Cardiac Enzymes:  Recent Labs Lab 02/21/17 1637 02/21/17 2347 02/22/17 0438 02/22/17 1217  TROPONINI 0.06* 0.07* 0.07* 0.06*   BNP (last 3 results) No results for input(s): PROBNP in the last 8760 hours. HbA1C: No results for input(s): HGBA1C in the last 72 hours. CBG: No results for input(s): GLUCAP  in the last 168 hours. Lipid Profile:  Recent Labs  02/24/17 1429  CHOL 115  HDL 39*  LDLCALC 66  TRIG 51  CHOLHDL 2.9   Thyroid Function Tests: No results for input(s): TSH, T4TOTAL, FREET4, T3FREE, THYROIDAB in the last 72 hours. Anemia Panel: No results for input(s): VITAMINB12, FOLATE, FERRITIN, TIBC, IRON, RETICCTPCT in the last 72 hours. Urine analysis:    Component Value Date/Time   COLORURINE YELLOW 02/25/2017 0654   APPEARANCEUR CLEAR 02/25/2017 0654   LABSPEC 1.023 02/25/2017 0654   PHURINE 6.0 02/25/2017 0654   GLUCOSEU NEGATIVE 02/25/2017 0654   HGBUR NEGATIVE 02/25/2017 0654   BILIRUBINUR NEGATIVE 02/25/2017 0654   KETONESUR NEGATIVE 02/25/2017 0654   PROTEINUR >=300 (A) 02/25/2017 0654   NITRITE NEGATIVE 02/25/2017 0654   LEUKOCYTESUR NEGATIVE 02/25/2017 0654   Sepsis Labs: _1 (procalcitonin:4,lacticidven:4)  ) Recent Results (from the past 240 hour(s))  Gram stain     Status: None   Collection Time: 02/22/17  3:36 PM  Result Value Ref Range Status   Specimen Description PLEURAL LEFT  Final   Special Requests NONE  Final   Gram Stain   Final    CYTOSPIN SMEAR WBC PRESENT,BOTH PMN AND MONONUCLEAR NO ORGANISMS SEEN    Report Status 02/23/2017 FINAL  Final  Culture, body fluid-bottle     Status: Abnormal   Collection Time: 02/22/17  3:36 PM  Result Value Ref Range Status   Specimen Description PLEURAL LEFT  Final   Special Requests NONE  Final   Gram Stain   Final    GRAM POSITIVE COCCI IN CLUSTERS AEROBIC BOTTLE ONLY CRITICAL RESULT CALLED TO, READ BACK BY AND VERIFIED WITH: L MCELWEE,RN AT 1820 02/23/17 BY L BENFIELD    Culture STAPHYLOCOCCUS SPECIES (COAGULASE NEGATIVE) (A)  Final   Report Status 02/25/2017 FINAL  Final   Organism ID, Bacteria STAPHYLOCOCCUS SPECIES (COAGULASE NEGATIVE)  Final      Susceptibility   Staphylococcus species (coagulase negative) - MIC*    CIPROFLOXACIN <=0.5 SENSITIVE Sensitive     ERYTHROMYCIN <=0.25  SENSITIVE Sensitive     GENTAMICIN <=0.5 SENSITIVE Sensitive     OXACILLIN <=0.25 SENSITIVE Sensitive     TETRACYCLINE <=1 SENSITIVE Sensitive     VANCOMYCIN <=0.5 SENSITIVE Sensitive     TRIMETH/SULFA <=10 SENSITIVE Sensitive     CLINDAMYCIN <=0.25 SENSITIVE Sensitive     RIFAMPIN <=0.5 SENSITIVE Sensitive     Inducible Clindamycin NEGATIVE Sensitive     * STAPHYLOCOCCUS SPECIES (  COAGULASE NEGATIVE)      Studies: No results found.  Scheduled Meds: . [MAR Hold] feeding supplement (PRO-STAT SUGAR FREE 64)  30 mL Oral TID BM  . [MAR Hold] folic acid  1 mg Oral Daily  . [MAR Hold] furosemide  80 mg Intravenous Daily  . [MAR Hold] gabapentin  100 mg Oral QHS  . [MAR Hold] lidocaine  1 patch Transdermal Q24H  . [MAR Hold] midodrine  5 mg Oral TID WC  . [MAR Hold] multivitamin with minerals  1 tablet Oral Daily  . [MAR Hold] simethicone  80 mg Oral Once  . [MAR Hold] sodium chloride flush  3 mL Intravenous Q12H  . [MAR Hold] sodium chloride flush  3 mL Intravenous Q12H  . [MAR Hold] thiamine  100 mg Oral Daily    Continuous Infusions: . sodium chloride    . sodium chloride    . [MAR Hold] magnesium sulfate 1 - 4 g bolus IVPB       LOS: 5 days    Assessment/Plan: Active Problems:  Acute on chronic hypoxic respiratory failure -Improving -Most likely multifactorial secondary to fluid overload versus COPD versus chronic lung mass -Continue breathing treatment as needed  Anemia -No reported obvious bleed -Recent hemoglobin 12; hemoglobin 9.9 today -FOBT ordered -Repeat CBC in the morning    COPD mixed type (HCC) -Appears stable at this time -Continue inhalers and breathing treatments    Lung mass -Keep appointment with hemonc    Hypoalbuminemia -Most likely secondary to protein calorie malnutrition -Patient encouraged to increase p.o. Intake -p.o. supplements started today    Hypotension -Continue to maintain map greater than 65 -Continue close monitoring of  vital signs    Fluid overload -Stable -Hold off Lasix due to hypotension    Acute on chronic diastolic CHF (congestive heart failure) (HCC) -Appears stable at this time -Continue close monitoring  Anasarca -Resolved  Proteinuria - Stable   Code Status: Full code  Family Communication: I spoke with the patient's wife in the room with the patient about his condition.  All questions answered to their satisfaction.  Disposition Plan: We will stay in the midnight due to persistent generalized weakness.   Consultants: Cardiology and nephrology  Procedures: None  Antimicrobials: None  DVT prophylaxis: On Xarelto for paroxysmal A. fib    Kayleen Memos, MD [facility] Pager [phone]-  If 7PM-7AM, please contact night-coverage [email or web address] Password [email or web address] 02/26/2017, 12:51 PM

## 2017-02-27 ENCOUNTER — Other Ambulatory Visit: Payer: Self-pay | Admitting: Interventional Radiology

## 2017-02-27 ENCOUNTER — Inpatient Hospital Stay (HOSPITAL_COMMUNITY): Payer: Medicare Other

## 2017-02-27 DIAGNOSIS — N049 Nephrotic syndrome with unspecified morphologic changes: Secondary | ICD-10-CM

## 2017-02-27 DIAGNOSIS — R809 Proteinuria, unspecified: Secondary | ICD-10-CM

## 2017-02-27 DIAGNOSIS — R0902 Hypoxemia: Secondary | ICD-10-CM

## 2017-02-27 LAB — COMPREHENSIVE METABOLIC PANEL
ALK PHOS: 202 U/L — AB (ref 38–126)
ALT: 14 U/L — ABNORMAL LOW (ref 17–63)
ANION GAP: 6 (ref 5–15)
AST: 21 U/L (ref 15–41)
Albumin: 1.7 g/dL — ABNORMAL LOW (ref 3.5–5.0)
BILIRUBIN TOTAL: 0.6 mg/dL (ref 0.3–1.2)
BUN: 33 mg/dL — ABNORMAL HIGH (ref 6–20)
CALCIUM: 8.1 mg/dL — AB (ref 8.9–10.3)
CO2: 32 mmol/L (ref 22–32)
Chloride: 99 mmol/L — ABNORMAL LOW (ref 101–111)
Creatinine, Ser: 1 mg/dL (ref 0.61–1.24)
GFR calc non Af Amer: >60 mL/min (ref >60)
Glucose, Bld: 103 mg/dL — ABNORMAL HIGH (ref 65–99)
Potassium: 5 mmol/L (ref 3.5–5.1)
Sodium: 137 mmol/L (ref 135–145)
TOTAL PROTEIN: 5.3 g/dL — AB (ref 6.5–8.1)

## 2017-02-27 LAB — HEPARIN LEVEL (UNFRACTIONATED): Heparin Unfractionated: 1.4 IU/mL — ABNORMAL HIGH (ref 0.30–0.70)

## 2017-02-27 LAB — APTT
aPTT: 37 seconds — ABNORMAL HIGH (ref 24–36)
aPTT: 34 seconds (ref 24–36)

## 2017-02-27 LAB — CBC WITH DIFFERENTIAL/PLATELET
Basophils Absolute: 0 10*3/uL (ref 0.0–0.1)
Basophils Relative: 0 %
EOS ABS: 0.1 10*3/uL (ref 0.0–0.7)
Eosinophils Relative: 1 %
HEMATOCRIT: 31.4 % — AB (ref 39.0–52.0)
HEMOGLOBIN: 10 g/dL — AB (ref 13.0–17.0)
LYMPHS ABS: 2.6 10*3/uL (ref 0.7–4.0)
Lymphocytes Relative: 29 %
MCH: 29.7 pg (ref 26.0–34.0)
MCHC: 31.8 g/dL (ref 30.0–36.0)
MCV: 93.2 fL (ref 78.0–100.0)
MONOS PCT: 14 %
Monocytes Absolute: 1.3 10*3/uL — ABNORMAL HIGH (ref 0.1–1.0)
NEUTROS PCT: 56 %
Neutro Abs: 4.9 10*3/uL (ref 1.7–7.7)
Platelets: 210 10*3/uL (ref 150–400)
RBC: 3.37 MIL/uL — ABNORMAL LOW (ref 4.22–5.81)
RDW: 15.9 % — ABNORMAL HIGH (ref 11.5–15.5)
WBC: 8.9 10*3/uL (ref 4.0–10.5)

## 2017-02-27 LAB — PROTIME-INR
INR: 1.44
PROTHROMBIN TIME: 17.5 s — AB (ref 11.4–15.2)

## 2017-02-27 LAB — MAGNESIUM: MAGNESIUM: 2.1 mg/dL (ref 1.7–2.4)

## 2017-02-27 MED ORDER — MIDAZOLAM HCL 2 MG/2ML IJ SOLN
INTRAMUSCULAR | Status: AC
  Filled 2017-02-27: qty 2

## 2017-02-27 MED ORDER — MIDODRINE HCL 5 MG PO TABS
5.0000 mg | ORAL_TABLET | Freq: Three times a day (TID) | ORAL | Status: DC
  Administered 2017-02-27 – 2017-03-02 (×12): 5 mg via ORAL
  Filled 2017-02-27 (×12): qty 1

## 2017-02-27 MED ORDER — LIDOCAINE HCL (PF) 1 % IJ SOLN
INTRAMUSCULAR | Status: AC
  Filled 2017-02-27: qty 30

## 2017-02-27 MED ORDER — FENTANYL CITRATE (PF) 100 MCG/2ML IJ SOLN
INTRAMUSCULAR | Status: AC | PRN
  Administered 2017-02-27: 50 ug via INTRAVENOUS

## 2017-02-27 MED ORDER — RIVAROXABAN 20 MG PO TABS: 20.0000 mg | ORAL_TABLET | Freq: Every day | ORAL | Status: DC

## 2017-02-27 MED ORDER — FENTANYL CITRATE (PF) 100 MCG/2ML IJ SOLN
INTRAMUSCULAR | Status: AC
  Filled 2017-02-27: qty 2

## 2017-02-27 MED ORDER — SODIUM CHLORIDE 0.9 % IV SOLN
INTRAVENOUS | Status: AC | PRN
  Administered 2017-02-27: 10 mL/h via INTRAVENOUS

## 2017-02-27 MED ORDER — MIDAZOLAM HCL 2 MG/2ML IJ SOLN
INTRAMUSCULAR | Status: AC | PRN
  Administered 2017-02-27: 1 mg via INTRAVENOUS

## 2017-02-27 MED ORDER — HEPARIN (PORCINE) IN NACL 100-0.45 UNIT/ML-% IJ SOLN
2200.0000 [IU]/h | INTRAMUSCULAR | Status: DC
  Administered 2017-02-27: 2200 [IU]/h via INTRAVENOUS
  Filled 2017-02-27: qty 250
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
## 2017-02-27 NOTE — Progress Notes (Signed)
S: continues to report fatigue and generalized body pain, DCCV yesterday and back in NSR but does not feel any different, reports decreased urine output yesterday  O:BP 92/60 (BP Location: Right Arm)   Pulse 79   Temp (!) 97.3 F (36.3 C) (Oral)   Resp 18   Ht _0  (1.803 m)   Wt 153 lb 14.4 oz (69.8 kg) Comment: scale a  SpO2 94%   BMI 21.46 kg/m   Intake/Output Summary (Last 24 hours) at 02/27/17 0813 Last data filed at 02/26/17 2146  Gross per 24 hour  Intake              503 ml  Output              500 ml  Net                3 ml   Intake/Output: I/O last 3 completed shifts: In: 503 [I.V.:503] Out: 600 [Urine:600]  Intake/Output this shift:  No intake/output data recorded. Weight change: 2 lb (0.907 kg) Gen: chronically ill appearing, no acute distress  CVS:RRR, no murmur  Resp: expiratory wheezes relieved with coughing  XKG:YJEH, non tender, non distended  Ext: 1+ bilateral upper and lower extremity pitting    Recent Labs Lab 02/21/17 1637 02/22/17 0438 02/22/17 1217 02/23/17 0445 02/24/17 0516 02/25/17 0504 02/26/17 0449 02/27/17 0216  NA 137 138 136 137 137 135 135 137  K 4.3 4.0 3.9 4.2 3.7 3.6 4.0 5.0  CL 103 104 103 105 100* 98* 96* 99*  CO2 _1 33* 32  GLUCOSE 107* 88 88 89 85 97 90 103*  BUN 26* 24* 25* 24* 26* 27* 32* 33*  CREATININE 0.95 0.95 0.86 0.92 0.98 0.89 0.93 1.00  ALBUMIN 1.7* 1.5* 1.6* 1.6*  --   --  1.7* 1.7*  CALCIUM 8.4* 8.1* 7.9* 7.9* 8.2* 8.0* 8.0* 8.1*  PHOS  --  3.9  --   --   --   --   --   --   AST _2 --   --   --  21  ALT 16* 15* 14* 14*  --   --   --  14*   Liver Function Tests:  Recent Labs Lab 02/22/17 1217 02/23/17 0445 02/26/17 0449 02/27/17 0216  AST 22 20  --  21  ALT 14* 14*  --  14*  ALKPHOS 183* 179*  --  202*  BILITOT 0.4 0.5  --  0.6  PROT 5.5* 5.3*  --  5.3*  ALBUMIN 1.6* 1.6* 1.7* 1.7*   No results for input(s): LIPASE, AMYLASE in the last 168 hours. No results for  input(s): AMMONIA in the last 168 hours. CBC:  Recent Labs Lab 02/22/17 0438  02/23/17 0445 02/24/17 0516 02/26/17 0449 02/27/17 0216  WBC 8.2  --  8.0 7.9 9.2 8.9  NEUTROABS  --   < > 4.0 4.2 4.7 4.9  HGB 10.5*  --  10.3* 9.7* 9.9* 10.0*  HCT 32.6*  --  32.7* 30.5* 31.3* 31.4*  MCV 92.4  --  92.6 92.4 92.6 93.2  PLT 326  --  318 271 239 210  < > = values in this interval not displayed. Cardiac Enzymes:  Recent Labs Lab 02/21/17 1637 02/21/17 2347 02/22/17 0438 02/22/17 1217  TROPONINI 0.06* 0.07* 0.07* 0.06*   CBG: No results for input(s): GLUCAP in the last 168 hours.  Iron Studies: No results for input(s):  IRON, TIBC, TRANSFERRIN, FERRITIN in the last 72 hours. Studies/Results: Dg Chest 2 View  Result Date: 02/25/2017 CLINICAL DATA:  Shortness of breath, back pain. EXAM: CHEST  2 VIEW COMPARISON:  02/22/2017 FINDINGS: There is small to moderate right pleural effusion. Cardiomegaly with diffuse interstitial and likely early alveolar opacities, right greater than left. Favor edema/ CHF. Slight interval worsening since prior study. No acute bony abnormality. IMPRESSION: Worsening small to moderate right pleural effusion and diffuse interstitial and early alveolar opacities, likely edema/CHF. Electronically Signed   By: Rolm Baptise M.D.   On: 02/25/2017 09:50   Dg Chest Bilateral Decubitus  Result Date: 02/25/2017 CLINICAL DATA:  Shortness of breath. EXAM: CHEST - BILATERAL DECUBITUS VIEW COMPARISON:  Chest x-ray today. FINDINGS: Moderate right pleural effusion which appears to remain present laterally on the left decubitus view suggesting loculation. No layering left pleural effusion. No pneumothorax. IMPRESSION: Moderate right pleural effusion, likely at least partially loculated laterally. Electronically Signed   By: Rolm Baptise M.D.   On: 02/25/2017 09:52   . feeding supplement (PRO-STAT SUGAR FREE 64)  30 mL Oral TID BM  . folic acid  1 mg Oral Daily  . furosemide   80 mg Intravenous Daily  . gabapentin  100 mg Oral QHS  . lidocaine  1 patch Transdermal Q24H  . midodrine  5 mg Oral TID WC  . multivitamin with minerals  1 tablet Oral Daily  . simethicone  80 mg Oral Once  . sodium chloride flush  3 mL Intravenous Q12H  . sodium chloride flush  3 mL Intravenous Q12H  . thiamine  100 mg Oral Daily    BMET    Component Value Date/Time   NA 137 02/27/2017 0216   NA 139 01/09/2017 1256   K 5.0 02/27/2017 0216   CL 99 (L) 02/27/2017 0216   CO2 32 02/27/2017 0216   GLUCOSE 103 (H) 02/27/2017 0216   BUN 33 (H) 02/27/2017 0216   BUN 26 01/09/2017 1256   CREATININE 1.00 02/27/2017 0216   CREATININE 0.84 06/12/2014 0922   CALCIUM 8.1 (L) 02/27/2017 0216   GFRNONAA >60 02/27/2017 0216   GFRAA >60 02/27/2017 0216   CBC    Component Value Date/Time   WBC 8.9 02/27/2017 0216   RBC 3.37 (L) 02/27/2017 0216   HGB 10.0 (L) 02/27/2017 0216   HGB 13.8 10/04/2016 1134   HCT 31.4 (L) 02/27/2017 0216   HCT 41.5 10/04/2016 1134   PLT 210 02/27/2017 0216   PLT 469 (H) 10/04/2016 1134   MCV 93.2 02/27/2017 0216   MCV 90 10/04/2016 1134   MCH 29.7 02/27/2017 0216   MCHC 31.8 02/27/2017 0216   RDW 15.9 (H) 02/27/2017 0216   RDW 14.9 10/04/2016 1134   LYMPHSABS 2.6 02/27/2017 0216   MONOABS 1.3 (H) 02/27/2017 0216   EOSABS 0.1 02/27/2017 0216   BASOSABS 0.0 02/27/2017 0216     Assessment/Plan:  1. 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 Decreased urine output - bladder scan today   3. Volume overload  - peripheral edema improving  - net positive urine output  yesterday, did not receive lasix  - IV lasix 80 mg qd   4. Acute on chronic congestive heart failure- received IV albumin, midodrine, and lasix, heart failure is managing  5. Pleural effusion - transudate with GPCs are though to be contaminant, vanc stopped, afebrile and without leukocytosis, CXR showed worsening despite thoracentesis 10/13 with 1.2 L out 6.  persistent afib - successful DCCV yesterday, on heparin bridge  7. Chronic Hypotension - improving, asymptomatic   Ledell Noss PGY2 IMTS    I have seen and examined this patient and agree with plan and assessment in the above note with renal recommendations/intervention highlighted.  For renal biopsy today, appreciate IR assistance.  He will need heme consult to eval M spike if renal biopsy shows light chain disease or myeloma.  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  Governor Rooks Nivek Powley,MD 02/27/2017 12:14 PM    Donetta Potts, MD [facility] [phone]

## 2017-02-27 NOTE — Progress Notes (Signed)
Spoke to IR  Stated to keep pt NPO and turn off heparin drip IR stated pt will be pulled for renal biopsy in 4 hours Stated okay for pt to have medications with sips of water

## 2017-02-27 NOTE — Sedation Documentation (Signed)
Patient is resting comfortably. 

## 2017-02-27 NOTE — Progress Notes (Signed)
Patient off oxygen for about 40 minutes per patient/family due to patient took oxygen off and forgot to put it back in nose. Patient lying in bed resting at this time. Oxygen level on room air 76%. Nasal cannula with 2L/min oxygen replaced and oxygen level increased to 84%. Patient instructed to cough and deep breathe. Patient coughed up yellow phlegm and oxygen level increased to 93%. Patient asymptomatic during this time.

## 2017-02-27 NOTE — Progress Notes (Signed)
ANTICOAGULATION CONSULT NOTE - [place] for heparin Indication: atrial fibrillation  Allergies  Allergen Reactions  . Demerol [Meperidine] Other (See Comments)    UNSPECIFIED REACTION  Causes system to shutdown. ?   . Oxycodone Hcl Other (See Comments)    Pt states this medication 'wires him up' and makes pt hyper; pt does not want to take this ever again    Patient Measurements: Height: _0  (180.3 cm) Weight: 153 lb 14.4 oz (69.8 kg) (scale a) IBW/kg (Calculated) : 75.3 Heparin Dosing Weight: 70kg  Vital Signs: Temp: 97.3 F (36.3 C) (10/18 0532) Temp Source: Oral (10/18 0532) BP: 89/54 (10/18 1405) Pulse Rate: 72 (10/18 1405)  Labs:  Recent Labs  02/25/17 0504 02/26/17 0449 02/26/17 1517 02/27/17 0216 02/27/17 1001  HGB  --  9.9*  --  10.0*  --   HCT  --  31.3*  --  31.4*  --   PLT  --  239  --  210  --   APTT  --   --  35 37* 34  LABPROT  --   --   --  17.5*  --   INR  --   --   --  1.44  --   HEPARINUNFRC  --   --   --  1.40*  --   CREATININE 0.89 0.93  --  1.00  --     Estimated Creatinine Clearance: 65 mL/min (by C-G formula based on SCr of 1 mg/dL).   Medical History: Past Medical History:  Diagnosis Date  . Anxiety   . Arthritis    "neck" (08/15/2015)  . Chronic bronchitis ([place])   . Chronic diastolic CHF (congestive heart failure) ([place])   . Constipation   . COPD (chronic obstructive pulmonary disease) ([place])   . DDD (degenerative disc disease), cervical   . Depression   . Dilated aortic root ([place])    15m on echo 05/2016  . Edema of left lower extremity   . Facial basal cell cancer   . H/O acne vulgaris 1960s   [facility]  . Headache    history of - left temporal- years ago- not current (08/15/2015)  . Persistent atrial fibrillation ([facility])    on coumadin with CHADS2VASC score of 2  . Pneumonia 1960s; 2015 X 2  . Prostate cancer ([facility] 2000[facility]   "low spreading; non  aggressive type" (08/15/2015)  . [place] HTN [place]    PASP 454mg by echo 05/2016  . Skin cancer    "back"    Assessment: [age]oM with PMH AFib on rivaroxaban PTA now s/p DCCV on 10/17 to NSR. Pt now s/p kidney biopsy today with heparin held this morning prior to procedure. Discussed with IR, okay to resume heparin ~6 hours after procedure.   Goal of Therapy:  Heparin level 0.3-0.7 units/ml  aPTT 66-102s Monitor platelets by anticoagulation protocol: Yes   Plan:  -Resume heparin 2200 units/hr at 2030 with no bolus -Check 8-hr aPTT -Daily aPTT/heparin level until correlating -Likely transition back to rivaroxaban tomorrow with supper  MiArrie SenatePharmD PGY-2 Cardiology Pharmacy Resident Pager: [phone]/18/2018

## 2017-02-27 NOTE — Progress Notes (Signed)
Pt transferred to IR

## 2017-02-27 NOTE — Progress Notes (Signed)
PT Cancellation Note  Patient Details Name: OSSIEL MARCHIO MRN: [id number] DOB: Jul 22, 1945   Cancelled Treatment:    Reason Eval/Treat Not Completed: Fatigue/lethargy limiting ability to participate Pt is s/p renal biopsy today and RN request PT be deferred until tomorrow. PT to follow up then.  Adriona Kaney B. Migdalia Dk PT, DPT Acute Rehabilitation  [phone] Pager [phone]  [place] 02/27/2017, 3:48 PM

## 2017-02-27 NOTE — Progress Notes (Signed)
Called IR  IR stated okay for pt to have PRN norco
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
## 2017-02-27 NOTE — Progress Notes (Signed)
OT Cancellation Note  Patient Details Name: [name] MRN: [id number] DOB: 22-Sep-1945   Cancelled Treatment:    Reason Eval/Treat Not Completed: Patient at procedure or test/ unavailable.  Attempted to see pt several times today.  Pt with multiple providers and then at procedure.  Will reattempt.  [place], OTR/L [phone]   Lucille Passy M 02/27/2017, 7:20 PM

## 2017-02-27 NOTE — Progress Notes (Signed)
[place] for heparin Indication: atrial fibrillation  Allergies  Allergen Reactions  . Demerol [Meperidine] Other (See Comments)    UNSPECIFIED REACTION  Causes system to shutdown. ?   . Oxycodone Hcl Other (See Comments)    Pt states this medication 'wires him up' and makes pt hyper; pt does not want to take this ever again    Patient Measurements: Height: _0  (180.3 cm) Weight: 151 lb 14.4 oz (68.9 kg) IBW/kg (Calculated) : 75.3 Heparin Dosing Weight: 70kg  Vital Signs: Temp: 98.3 F (36.8 C) (10/17 1943) Temp Source: Oral (10/17 1943) BP: 94/58 (10/17 2115) Pulse Rate: 88 (10/17 1943)  Labs:  Recent Labs  02/24/17 0516 02/25/17 0504 02/26/17 0449 02/26/17 1517 02/27/17 0216  HGB 9.7*  --  9.9*  --  10.0*  HCT 30.5*  --  31.3*  --  31.4*  PLT 271  --  239  --  210  APTT  --   --   --  35 37*  LABPROT  --   --   --   --  17.5*  INR  --   --   --   --  1.44  HEPARINUNFRC  --   --   --   --  1.40*  CREATININE 0.98 0.89 0.93  --  1.00    Estimated Creatinine Clearance: 64.1 mL/min (by C-G formula based on SCr of 1 mg/dL).  Assessment: 71 y.o. male with h/o Afib, Xarelto on hold, for heparin  Goal of Therapy:  APTT 66-102 Heparin level 0.3-0.7 units/ml Monitor platelets by anticoagulation protocol: Yes   Plan:  Increase Heparin  2200 units/hr APTT in 8 hours  Phillis Knack, PharmD, BCPS  02/27/2017 3:44 AM

## 2017-02-27 NOTE — Procedures (Signed)
Interventional Radiology Procedure Note  Procedure: US guided random renal biopsy  Complications: None  Estimated Blood Loss: None  Recommendations: - Bedrest x 4 hrs  Signed,  Criselda Peaches, MD

## 2017-02-27 NOTE — Progress Notes (Signed)
PROGRESS NOTE  [name] JEH:[id number] DOB: 04-07-1946 DOA: 02/21/2017 PCP: Flossie Buffy, NP  HPI/Recap of past 24 hours: [name] is a 71 year old male with past medical history significant for paroxysmal A. fib on Xarelto, diastolic CHF with EF of 37-85% (05/14/16), COPD on 2L O2 at night, pulmonary hypertension status post right VATS in May 20, 2016 due to empyema, history of lung mass, who presented on 02/21/17 with complaints of fatigue and generalized swelling. Patient was admitted for acute hypoxic respiratory failure, fluid overload, and chronic proteinuria with hypoalbuminemia.  Patient had cardioversion done yesterday. Reports some tenderness on chest and back due to skin irritation post cardioversion, sharp pain to the touch, 3/10 at its best, improved with hydrocortisone cream. Breathing is improved. Patient is scheduled for renal biopsy today by interventional radiology. No other complaints.  Exam:   General: A 71 year old Caucasian male, lying in bed on 2L O2 [place]. Appears mildly uncomfortable when moved in the bed due to skin irritation.  Cardiovascular: Regular rate and rhythm with no murmurs or rubs noted.  Trace edema in lower extremities bilaterally.  Respiratory: Lungs are clear to auscultation bilaterally.  No wheezes or rhonchi noted.  Abdomen: Soft nontender nondistended with normal bowel sounds.  Musculoskeletal: Strength intact in all 4 extremities. Moves all limbs without any difficulties.  Skin: Erythema noted on left side of chest and back.  Psychiatry: Normal mood and affect for condition and circumstances   Objective: Vitals:   02/27/17 1345 02/27/17 1356 02/27/17 1400 02/27/17 1405  BP: 123/78 104/70 98/67 (!) 89/54  Pulse: 78 76 73 72  Resp: _0 Temp:      TempSrc:      SpO2: 97% 97% 98% 93%  Weight:      Height:        Intake/Output Summary (Last 24 hours) at 02/27/17 1410 Last data filed at 02/27/17 1140  Gross  per 24 hour  Intake              480 ml  Output              850 ml  Net             -370 ml   Filed Weights   02/25/17 0349 02/26/17 0557 02/27/17 0354  Weight: 69.7 kg (153 lb 11.2 oz) 68.9 kg (151 lb 14.4 oz) 69.8 kg (153 lb 14.4 oz)     Data Reviewed: CBC:  Recent Labs Lab 02/22/17 0438 02/23/17 0445 02/24/17 0516 02/26/17 0449 02/27/17 0216  WBC 8.2 8.0 7.9 9.2 8.9  NEUTROABS  --  4.0 4.2 4.7 4.9  HGB 10.5* 10.3* 9.7* 9.9* 10.0*  HCT 32.6* 32.7* 30.5* 31.3* 31.4*  MCV 92.4 92.6 92.4 92.6 93.2  PLT 326 318 271 239 885   Basic Metabolic Panel:  Recent Labs Lab 02/22/17 0438  02/23/17 0445 02/24/17 0516 02/25/17 0504 02/26/17 0449 02/27/17 0216  NA 138  < > 137 137 135 135 137  K 4.0  < > 4.2 3.7 3.6 4.0 5.0  CL 104  < > 105 100* 98* 96* 99*  CO2 27  < > _1 33* 32  GLUCOSE 88  < > 89 85 97 90 103*  BUN 24*  < > 24* 26* 27* 32* 33*  CREATININE 0.95  < > 0.92 0.98 0.89 0.93 1.00  CALCIUM 8.1*  < > 7.9* 8.2* 8.0* 8.0* 8.1*  MG 1.4*  --  1.8 1.7  1.7 1.7 2.1  PHOS 3.9  --   --   --   --   --   --   < > = values in this interval not displayed. GFR: Estimated Creatinine Clearance: 65 mL/min (by C-G formula based on SCr of 1 mg/dL). Liver Function Tests:  Recent Labs Lab 02/21/17 1637 02/22/17 0438 02/22/17 1217 02/23/17 0445 02/26/17 0449 02/27/17 0216  AST _0 --  21  ALT 16* 15* 14* 14*  --  14*  ALKPHOS 215* 185* 183* 179*  --  202*  BILITOT 0.6 0.6 0.4 0.5  --  0.6  PROT 6.0* 5.4* 5.5* 5.3*  --  5.3*  ALBUMIN 1.7* 1.5* 1.6* 1.6* 1.7* 1.7*   No results for input(s): LIPASE, AMYLASE in the last 168 hours. No results for input(s): AMMONIA in the last 168 hours. Coagulation Profile:  Recent Labs Lab 02/27/17 0216  INR 1.44   Cardiac Enzymes:  Recent Labs Lab 02/21/17 1637 02/21/17 2347 02/22/17 0438 02/22/17 1217  TROPONINI 0.06* 0.07* 0.07* 0.06*   BNP (last 3 results) No results for input(s): PROBNP in the last 8760  hours. HbA1C: No results for input(s): HGBA1C in the last 72 hours. CBG: No results for input(s): GLUCAP in the last 168 hours. Lipid Profile:  Recent Labs  02/24/17 1429  CHOL 115  HDL 39*  LDLCALC 66  TRIG 51  CHOLHDL 2.9   Thyroid Function Tests: No results for input(s): TSH, T4TOTAL, FREET4, T3FREE, THYROIDAB in the last 72 hours. Anemia Panel: No results for input(s): VITAMINB12, FOLATE, FERRITIN, TIBC, IRON, RETICCTPCT in the last 72 hours. Urine analysis:    Component Value Date/Time   COLORURINE YELLOW 02/25/2017 0654   APPEARANCEUR CLEAR 02/25/2017 0654   LABSPEC 1.023 02/25/2017 0654   PHURINE 6.0 02/25/2017 0654   GLUCOSEU NEGATIVE 02/25/2017 0654   HGBUR NEGATIVE 02/25/2017 0654   BILIRUBINUR NEGATIVE 02/25/2017 0654   KETONESUR NEGATIVE 02/25/2017 0654   PROTEINUR >=300 (A) 02/25/2017 0654   NITRITE NEGATIVE 02/25/2017 0654   LEUKOCYTESUR NEGATIVE 02/25/2017 0654   Sepsis Labs: _1 (procalcitonin:4,lacticidven:4)  ) Recent Results (from the past 240 hour(s))  Gram stain     Status: None   Collection Time: 02/22/17  3:36 PM  Result Value Ref Range Status   Specimen Description PLEURAL LEFT  Final   Special Requests NONE  Final   Gram Stain   Final    CYTOSPIN SMEAR WBC PRESENT,BOTH PMN AND MONONUCLEAR NO ORGANISMS SEEN    Report Status 02/23/2017 FINAL  Final  Culture, body fluid-bottle     Status: Abnormal   Collection Time: 02/22/17  3:36 PM  Result Value Ref Range Status   Specimen Description PLEURAL LEFT  Final   Special Requests NONE  Final   Gram Stain   Final    GRAM POSITIVE COCCI IN CLUSTERS AEROBIC BOTTLE ONLY CRITICAL RESULT CALLED TO, READ BACK BY AND VERIFIED WITH: L MCELWEE,RN AT 3500 02/23/17 BY L BENFIELD    Culture STAPHYLOCOCCUS SPECIES (COAGULASE NEGATIVE) (A)  Final   Report Status 02/25/2017 FINAL  Final   Organism ID, Bacteria STAPHYLOCOCCUS SPECIES (COAGULASE NEGATIVE)  Final      Susceptibility    Staphylococcus species (coagulase negative) - MIC*    CIPROFLOXACIN <=0.5 SENSITIVE Sensitive     ERYTHROMYCIN <=0.25 SENSITIVE Sensitive     GENTAMICIN <=0.5 SENSITIVE Sensitive     OXACILLIN <=0.25 SENSITIVE Sensitive     TETRACYCLINE <=1 SENSITIVE Sensitive     VANCOMYCIN <=  0.5 SENSITIVE Sensitive     TRIMETH/SULFA <=10 SENSITIVE Sensitive     CLINDAMYCIN <=0.25 SENSITIVE Sensitive     RIFAMPIN <=0.5 SENSITIVE Sensitive     Inducible Clindamycin NEGATIVE Sensitive     * STAPHYLOCOCCUS SPECIES (COAGULASE NEGATIVE)      Studies: No results found.  Scheduled Meds: . feeding supplement (PRO-STAT SUGAR FREE 64)  30 mL Oral TID BM  . fentaNYL      . folic acid  1 mg Oral Daily  . furosemide  80 mg Intravenous Daily  . gabapentin  100 mg Oral QHS  . lidocaine  1 patch Transdermal Q24H  . lidocaine (PF)      . lidocaine (PF)      . midazolam      . midodrine  5 mg Oral TID WC  . multivitamin with minerals  1 tablet Oral Daily  . simethicone  80 mg Oral Once  . sodium chloride flush  3 mL Intravenous Q12H  . sodium chloride flush  3 mL Intravenous Q12H  . thiamine  100 mg Oral Daily    Continuous Infusions: . sodium chloride    . sodium chloride    . sodium chloride 10 mL/hr (02/27/17 1345)     LOS: 6 days    Assessment/Plan: Active Problems:  Acute on chronic hypoxic respiratory failure -Improving -Most likely multifactorial secondary to fluid overload versus COPD versus chronic lung mass -Continue O2 supplement as needed and breathing treatment  Hypoalbuminemia 2/2 chronic proteinuria - Proteinuria reported since 2015 - Nephrology following - Renal biopsy scheduled today by interventional radiology  Moderate calorie protein malnutrition - Albumin 1.7, total protein 5.3 (02/27/17) - Continue oral supplement - Patient encouraged to increase po protein calorie intake   Hypotension -Continue to maintain MAP greater than 65 -May consider midodrine if MAP less  than 60 -Continue close monitoring of vital signs  Anemia -Hg is stable -No reported obvious bleed -Hemoglobin 10.0 from 9.9 yesterday -Repeat CBC in the morning    COPD mixed type (HCC) -Appears stable at this time -Continue breathing treatment with duonebs    Lung mass -Keep appointment with hemoncology    Fluid overload -Improving -Hold off Lasix due to hypotension    Acute on chronic diastolic CHF (congestive heart failure) (HCC) -Appears stable at this time. 2D Echo EF 60-65% (02/22/17) -Hold off lasix due to hypotension -Continue close monitoring  Anasarca -Improving  Code Status: Full code  Family Communication: I spoke with the patient's wife in the room. All questions answered to their satisfaction.  Disposition Plan: Will stay another midnight due to planned renal biopsy today. Will monitor BP and symptoms closely overnight.   Consultants: Cardiology and nephrology  Procedures: Renal biopsy planned today.  Antimicrobials: None   DVT prophylaxis: On heparin drip due to planned renal biopsy later on today.     Kayleen Memos, MD [facility] Pager [phone]-  If 7PM-7AM, please contact night-coverage [email or web address] Password [email or web address] 02/27/2017, 2:10 PM

## 2017-02-28 LAB — CBC WITH DIFFERENTIAL/PLATELET
BASOS ABS: 0.1 10*3/uL (ref 0.0–0.1)
BASOS PCT: 1 %
EOS PCT: 2 %
Eosinophils Absolute: 0.1 10*3/uL (ref 0.0–0.7)
HEMATOCRIT: 36.2 % — AB (ref 39.0–52.0)
Hemoglobin: 11.8 g/dL — ABNORMAL LOW (ref 13.0–17.0)
Lymphocytes Relative: 22 %
Lymphs Abs: 1.8 10*3/uL (ref 0.7–4.0)
MCH: 30.5 pg (ref 26.0–34.0)
MCHC: 32.6 g/dL (ref 30.0–36.0)
MCV: 93.5 fL (ref 78.0–100.0)
MONO ABS: 1 10*3/uL (ref 0.1–1.0)
MONOS PCT: 12 %
NEUTROS ABS: 5.3 10*3/uL (ref 1.7–7.7)
Neutrophils Relative %: 63 %
PLATELETS: 221 10*3/uL (ref 150–400)
RBC: 3.87 MIL/uL — ABNORMAL LOW (ref 4.22–5.81)
RDW: 15.7 % — AB (ref 11.5–15.5)
WBC: 8.2 10*3/uL (ref 4.0–10.5)

## 2017-02-28 LAB — COMPREHENSIVE METABOLIC PANEL
ALBUMIN: 1.9 g/dL — AB (ref 3.5–5.0)
ALT: 16 U/L — ABNORMAL LOW (ref 17–63)
ANION GAP: 7 (ref 5–15)
AST: 25 U/L (ref 15–41)
Alkaline Phosphatase: 222 U/L — ABNORMAL HIGH (ref 38–126)
BILIRUBIN TOTAL: 0.5 mg/dL (ref 0.3–1.2)
BUN: 31 mg/dL — ABNORMAL HIGH (ref 6–20)
CHLORIDE: 97 mmol/L — AB (ref 101–111)
CO2: 32 mmol/L (ref 22–32)
Calcium: 8.4 mg/dL — ABNORMAL LOW (ref 8.9–10.3)
Creatinine, Ser: 0.94 mg/dL (ref 0.61–1.24)
GFR calc Af Amer: >60 mL/min (ref >60)
Glucose, Bld: 88 mg/dL (ref 65–99)
POTASSIUM: 3.6 mmol/L (ref 3.5–5.1)
Sodium: 136 mmol/L (ref 135–145)
TOTAL PROTEIN: 6.5 g/dL (ref 6.5–8.1)

## 2017-02-28 LAB — MAGNESIUM: MAGNESIUM: 2 mg/dL (ref 1.7–2.4)

## 2017-02-28 MED ORDER — BOOST / RESOURCE BREEZE PO LIQD
1.0000 | Freq: Three times a day (TID) | ORAL | Status: DC
  Administered 2017-03-01 (×3): 1 via ORAL

## 2017-02-28 MED ORDER — RIVAROXABAN 20 MG PO TABS
20.0000 mg | ORAL_TABLET | Freq: Every day | ORAL | Status: DC
  Administered 2017-02-28 – 2017-03-02 (×3): 20 mg via ORAL
  Filled 2017-02-28 (×4): qty 1

## 2017-02-28 MED ORDER — FUROSEMIDE 40 MG PO TABS
40.0000 mg | ORAL_TABLET | Freq: Every day | ORAL | Status: DC
  Administered 2017-02-28 – 2017-03-02 (×3): 40 mg via ORAL
  Filled 2017-02-28 (×3): qty 1

## 2017-02-28 MED ORDER — GABAPENTIN 100 MG PO CAPS
200.0000 mg | ORAL_CAPSULE | Freq: Every day | ORAL | Status: DC
  Filled 2017-02-28: qty 2

## 2017-02-28 NOTE — Progress Notes (Signed)
Nutrition Follow Up  DOCUMENTATION CODES:   Non-severe (moderate) malnutrition in context of chronic illness  INTERVENTION:    Prostat liquid protein po 30 ml TID with meals, each supplement provides 100 kcal, 15 grams protein   Boost Breeze po TID, each supplement provides 250 kcal and 9 grams of protein  NUTRITION DIAGNOSIS:   Increased nutrient needs related to chronic illness (COPD, HF, Prostate Cancer) as evidenced by estimated nutritional requirements for these comorbidities, ongoing  GOAL:   Patient will meet greater than or equal to 90% of their needs, progressing   MONITOR:   PO intake, Supplement acceptance, Labs, Weight trends, Skin  ASSESSMENT:   71 y/o male PMHx PAF, CHF, COPD, HTN, Prostate Cancer, Anxiety/Depression. Presented with fatigue, decreased intake and generalized swelling. Had recently had decrease in Lasix dose. At 10/12 outpatient cardiology appointment, noted to have gained 8 lbs in approximately 2 weeks and have albumin 1.7. RD consulted regarding this.   Pt reports his appetite is "okay". He stated "I'm eating". PO intake variable at 25-85% per flowsheet records. Has unopened Ensure supplement on his tray table. Does not like.  States he might discharge today but agreeable to try [employer].  Medications reviewed and include folvite, MVI and thiamine. Labs reviewed. Cl 97 (L). BUN 31 (H).  Diet Order:  Diet Heart Room service appropriate? Yes; Fluid consistency: Thin  Skin:  Reviewed, no issues  Last BM:  10/17  Height:  Ht Readings from Last 1 Encounters:  02/21/17 _0  (1.803 m)   Weight:  Wt Readings from Last 1 Encounters:  02/28/17 150 lb 12.8 oz (68.4 kg)   Wt Readings from Last 10 Encounters:  02/28/17 150 lb 12.8 oz (68.4 kg)  02/21/17 165 lb 12.8 oz (75.2 kg)  02/06/17 153 lb (69.4 kg)  01/02/17 139 lb 11.2 oz (63.4 kg)  01/01/17 139 lb (63 kg)  12/30/16 139 lb (63 kg)  12/23/16 142 lb 6.4 oz (64.6 kg)  12/11/16  155 lb 12.8 oz (70.7 kg)  12/09/16 159 lb 6.4 oz (72.3 kg)  12/02/16 164 lb (74.4 kg)   Ideal Body Weight:  78.18 kg  BMI:  Body mass index using dry weight is 21.3 kg/m.  Estimated Nutritional Needs:   Kcal:  1950-2150   Protein:  90-105 gm   Fluid:  1.9-2.1 L   EDUCATION NEEDS:   No education needs identified at this time   Arthur Holms, RD, LDN Pager #: [phone] After-Hours Pager #: [phone]

## 2017-02-28 NOTE — Progress Notes (Addendum)
S: reports he is feeling well today. No problems with renal biopsy yesterday. O:BP (!) 90/50 (BP Location: Right Arm)   Pulse 74   Temp 97.9 F (36.6 C) (Oral)   Resp 18   Ht 5' 11" (1.803 m)   Wt 150 lb 12.8 oz (68.4 kg) Comment: scale a  SpO2 93%   BMI 21.03 kg/m   Intake/Output Summary (Last 24 hours) at 02/28/17 1337 Last data filed at 02/28/17 0700  Gross per 24 hour  Intake          1049.37 ml  Output              945 ml  Net           104.37 ml   Intake/Output: I/O last 3 completed shifts: In: 1049.4 [P.O.:840; I.V.:209.4] Out: 1795 [Urine:1795]  Intake/Output this shift:  No intake/output data recorded. Weight change: -3 lb 1.6 oz (-1.406 kg) Gen:lert and oriented, no acute distress CVS: regular rate and rhythm, no murmur Resp: clear to auscultation  Abd:soft, nontender Ext: 1+ upper and lower extremity edema   Recent Labs Lab 02/21/17 1637 02/22/17 0438 02/22/17 1217 02/23/17 0445 02/24/17 0516 02/25/17 0504 02/26/17 0449 02/27/17 0216 02/28/17 1115  NA 137 138 136 137 137 135 135 137 136  K 4.3 4.0 3.9 4.2 3.7 3.6 4.0 5.0 3.6  CL 103 104 103 105 100* 98* 96* 99* 97*  CO2 27 27 25 27 29 30 33* 32 32  GLUCOSE 107* 88 88 89 85 97 90 103* 88  BUN 26* 24* 25* 24* 26* 27* 32* 33* 31*  CREATININE 0.95 0.95 0.86 0.92 0.98 0.89 0.93 1.00 0.94  ALBUMIN 1.7* 1.5* 1.6* 1.6*  --   --  1.7* 1.7* 1.9*  CALCIUM 8.4* 8.1* 7.9* 7.9* 8.2* 8.0* 8.0* 8.1* 8.4*  PHOS  --  3.9  --   --   --   --   --   --   --   AST 25 22 22 20  --   --   --  21 25  ALT 16* 15* 14* 14*  --   --   --  14* 16*   Liver Function Tests:  Recent Labs Lab 02/23/17 0445 02/26/17 0449 02/27/17 0216 02/28/17 1115  AST 20  --  21 25  ALT 14*  --  14* 16*  ALKPHOS 179*  --  202* 222*  BILITOT 0.5  --  0.6 0.5  PROT 5.3*  --  5.3* 6.5  ALBUMIN 1.6* 1.7* 1.7* 1.9*   No results for input(s): LIPASE, AMYLASE in the last 168 hours. No results for input(s): AMMONIA in the last 168  hours. CBC:  Recent Labs Lab 02/23/17 0445 02/24/17 0516 02/26/17 0449 02/27/17 0216 02/28/17 1115  WBC 8.0 7.9 9.2 8.9 8.2  NEUTROABS 4.0 4.2 4.7 4.9 5.3  HGB 10.3* 9.7* 9.9* 10.0* 11.8*  HCT 32.7* 30.5* 31.3* 31.4* 36.2*  MCV 92.6 92.4 92.6 93.2 93.5  PLT 318 271 239 210 221   Cardiac Enzymes:  Recent Labs Lab 02/21/17 1637 02/21/17 2347 02/22/17 0438 02/22/17 1217  TROPONINI 0.06* 0.07* 0.07* 0.06*   CBG: No results for input(s): GLUCAP in the last 168 hours.  Iron Studies: No results for input(s): IRON, TIBC, TRANSFERRIN, FERRITIN in the last 72 hours. Studies/Results: Us Biopsy (kidney)  Result Date: 02/27/2017 INDICATION: 71-year-old male with proteinuria. EXAM: ULTRASOUND GUIDED RENAL BIOPSY COMPARISON:  None. MEDICATIONS: Fentanyl 100 mcg IV; Versed 2 mg IV ANESTHESIA/SEDATION:   Total Moderate Sedation time 10 minutes COMPLICATIONS: None immediate PROCEDURE: Informed written consent was obtained from the patient after a discussion of the risks, benefits and alternatives to treatment. The patient understands and consents the procedure. A timeout was performed prior to the initiation of the procedure. Ultrasound scanning was performed of the bilateral flanks. The inferior pole of the right kidney was selected for biopsy due to location and sonographic window. The procedure was planned. The operative site was prepped and draped in the usual sterile fashion. The overlying soft tissues were anesthetized with 1% lidocaine with epinephrine. A 17 gauge core needle biopsy device was advanced into the inferior cortex of the right kidney and 2 core biopsies were obtained under direct ultrasound guidance. Images were saved for documentation purposes. The biopsy device was removed and hemostasis was obtained with manual compression. Post procedural scanning was negative for significant post procedural hemorrhage or additional complication. A dressing was placed. The patient tolerated the  procedure well without immediate post procedural complication. IMPRESSION: Technically successful ultrasound guided right renal biopsy. Electronically Signed   By: Jacqulynn Cadet M.D.   On: 02/27/2017 14:33   . feeding supplement (PRO-STAT SUGAR FREE 64)  30 mL Oral TID BM  . folic acid  1 mg Oral Daily  . furosemide  40 mg Oral Daily  . gabapentin  100 mg Oral QHS  . lidocaine  1 patch Transdermal Q24H  . midodrine  5 mg Oral TID WC  . multivitamin with minerals  1 tablet Oral Daily  . rivaroxaban  20 mg Oral Q supper  . simethicone  80 mg Oral Once  . sodium chloride flush  3 mL Intravenous Q12H  . sodium chloride flush  3 mL Intravenous Q12H  . thiamine  100 mg Oral Daily    BMET    Component Value Date/Time   NA 136 02/28/2017 1115   NA 139 01/09/2017 1256   K 3.6 02/28/2017 1115   CL 97 (L) 02/28/2017 1115   CO2 32 02/28/2017 1115   GLUCOSE 88 02/28/2017 1115   BUN 31 (H) 02/28/2017 1115   BUN 26 01/09/2017 1256   CREATININE 0.94 02/28/2017 1115   CREATININE 0.84 06/12/2014 0922   CALCIUM 8.4 (L) 02/28/2017 1115   GFRNONAA >60 02/28/2017 1115   GFRAA >60 02/28/2017 1115   CBC    Component Value Date/Time   WBC 8.2 02/28/2017 1115   RBC 3.87 (L) 02/28/2017 1115   HGB 11.8 (L) 02/28/2017 1115   HGB 13.8 10/04/2016 1134   HCT 36.2 (L) 02/28/2017 1115   HCT 41.5 10/04/2016 1134   PLT 221 02/28/2017 1115   PLT 469 (H) 10/04/2016 1134   MCV 93.5 02/28/2017 1115   MCV 90 10/04/2016 1134   MCH 30.5 02/28/2017 1115   MCHC 32.6 02/28/2017 1115   RDW 15.7 (H) 02/28/2017 1115   RDW 14.9 10/04/2016 1134   LYMPHSABS 1.8 02/28/2017 1115   MONOABS 1.0 02/28/2017 1115   EOSABS 0.1 02/28/2017 1115   BASOSABS 0.1 02/28/2017 1115     Assessment/Plan:  1. Nephrotic range Proteinuria - renal biopsy yesterday, will follow-up results. 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 The other differential is amyloid, also possible given his heart and  kidney dysfunction.   2. Decreased urine output- improved after lasix given yesterday. He has been switched to po lasix.   3. Volume overload - peripheral edema continues to improve, he will be switched to po lasix  4. Acute on chronic congestive heart failure- received IV albumin, midodrine, and lasix, heart failureis managing  5. Pleural effusion - transudate with GPCs are though to be contaminant, vanc stopped, afebrile and without leukocytosis, CXR showed worsening despite thoracentesis 10/13 with 1.2 L out 6. persistent afib- NSR today, back on xarelto  7.Chronic Hypotension -stable, still asymptomatic   Ledell Noss, MD   I have seen and examined this patient and agree with plan and assessment in the above note with renal recommendations/intervention highlighted.  Agree with Dr. Aundra Dubin regarding suspicion for AL amyloidosis.  Will await renal biopsy and may need Heme Onc consult for M spike and possible amyloid.  Governor Rooks Shon Indelicato,MD 02/28/2017 2:20 PM   Donetta Potts, MD [facility] [phone]

## 2017-02-28 NOTE — Progress Notes (Signed)
SATURATION QUALIFICATIONS: (This note is used to comply with regulatory documentation for home oxygen)  Patient Saturations on Room Air at Rest = 93%  Patient Saturations on Room Air while Ambulating = 85%  Patient Saturations on 2 Liters of oxygen while Ambulating = 94%  Please briefly explain why patient needs home oxygen: Pt unable to maintain SaO2  Greater than 90%O2 without 2L/min supplemental oxygen.  [name] B. Migdalia Dk PT, DPT Acute Rehabilitation  [phone] Pager [phone]

## 2017-02-28 NOTE — Progress Notes (Signed)
PROGRESS NOTE  [name] KDX:[id number] DOB: 1945-10-25 DOA: 02/21/2017 PCP: Flossie Buffy, NP  HPI/Recap of past 24 hours: Mr. Sondgeroth is a 71 year old male with past medical history significant for paroxysmal A. fib on Xarelto,diastolic CHF with EF of 97-67% (05/14/16),COPD on 2L O2 at night,pulmonary hypertension status post right VATS in May 20, 2016 due toempyema, history of lung mass,who presented on 10/12/18with complaints of fatigue and generalized swelling. Patient was admitted for acute hypoxic respiratory failure, fluid overload, and chronic proteinuriawith hypoalbuminemia.  No acute issues overnight. Renal biopsy done yesterday 02/27/17.[name] Results revealed AL amyolodosis  Exam:   General:A 71 year old Caucasian male,lying in bed on 2L O2 [place]. In no acute distress.  Cardiovascular:Regular rate and rhythm with no murmurs or rubs noted.Trace edema in lower extremities bilaterally.  Respiratory:Lungs are clear to auscultation bilaterally.No wheezes or rhonchi noted.  Abdomen:Soft nontender nondistended with normal bowel sounds.  Musculoskeletal: Strength intact in all 4 extremities.Moves all limbs without any difficulties.  Skin:Erythema noted on left side of chest and back.  Psychiatry:Normal mood and affect for condition and circumstances   Objective: Vitals:   02/27/17 2119 02/28/17 0524 02/28/17 0900 02/28/17 1320  BP: 98/63 99/60 (!) 90/50 (!) 100/56  Pulse: 82 77 74 76  Resp: _0 Temp: 98.2 F (36.8 C) 97.9 F (36.6 C)  97.8 F (36.6 C)  TempSrc: Oral Oral  Oral  SpO2: 98% 93%  93%  Weight:  68.4 kg (150 lb 12.8 oz)    Height:        Intake/Output Summary (Last 24 hours) at 02/28/17 1832 Last data filed at 02/28/17 1752  Gross per 24 hour  Intake          1169.37 ml  Output             1270 ml  Net          -100.63 ml   Filed Weights   02/26/17 0557 02/27/17 0354 02/28/17 0524  Weight: 68.9 kg (151 lb 14.4  oz) 69.8 kg (153 lb 14.4 oz) 68.4 kg (150 lb 12.8 oz)     Data Reviewed: CBC:  Recent Labs Lab 02/23/17 0445 02/24/17 0516 02/26/17 0449 02/27/17 0216 02/28/17 1115  WBC 8.0 7.9 9.2 8.9 8.2  NEUTROABS 4.0 4.2 4.7 4.9 5.3  HGB 10.3* 9.7* 9.9* 10.0* 11.8*  HCT 32.7* 30.5* 31.3* 31.4* 36.2*  MCV 92.6 92.4 92.6 93.2 93.5  PLT 318 271 239 210 341   Basic Metabolic Panel:  Recent Labs Lab 02/22/17 0438  02/24/17 0516 02/25/17 0504 02/26/17 0449 02/27/17 0216 02/28/17 1115  NA 138  < > 137 135 135 137 136  K 4.0  < > 3.7 3.6 4.0 5.0 3.6  CL 104  < > 100* 98* 96* 99* 97*  CO2 27  < > 29 30 33* 32 32  GLUCOSE 88  < > 85 97 90 103* 88  BUN 24*  < > 26* 27* 32* 33* 31*  CREATININE 0.95  < > 0.98 0.89 0.93 1.00 0.94  CALCIUM 8.1*  < > 8.2* 8.0* 8.0* 8.1* 8.4*  MG 1.4*  < > 1.7 1.7 1.7 2.1 2.0  PHOS 3.9  --   --   --   --   --   --   < > = values in this interval not displayed. GFR: Estimated Creatinine Clearance: 67.7 mL/min (by C-G formula based on SCr of 0.94 mg/dL). Liver Function Tests:  Recent Labs Lab  02/22/17 0438 02/22/17 1217 02/23/17 0445 02/26/17 0449 02/27/17 0216 02/28/17 1115  AST _0 --  21 25  ALT 15* 14* 14*  --  14* 16*  ALKPHOS 185* 183* 179*  --  202* 222*  BILITOT 0.6 0.4 0.5  --  0.6 0.5  PROT 5.4* 5.5* 5.3*  --  5.3* 6.5  ALBUMIN 1.5* 1.6* 1.6* 1.7* 1.7* 1.9*   No results for input(s): LIPASE, AMYLASE in the last 168 hours. No results for input(s): AMMONIA in the last 168 hours. Coagulation Profile:  Recent Labs Lab 02/27/17 0216  INR 1.44   Cardiac Enzymes:  Recent Labs Lab 02/21/17 2347 02/22/17 0438 02/22/17 1217  TROPONINI 0.07* 0.07* 0.06*   BNP (last 3 results) No results for input(s): PROBNP in the last 8760 hours. HbA1C: No results for input(s): HGBA1C in the last 72 hours. CBG: No results for input(s): GLUCAP in the last 168 hours. Lipid Profile: No results for input(s): CHOL, HDL, LDLCALC, TRIG, CHOLHDL,  LDLDIRECT in the last 72 hours. Thyroid Function Tests: No results for input(s): TSH, T4TOTAL, FREET4, T3FREE, THYROIDAB in the last 72 hours. Anemia Panel: No results for input(s): VITAMINB12, FOLATE, FERRITIN, TIBC, IRON, RETICCTPCT in the last 72 hours. Urine analysis:    Component Value Date/Time   COLORURINE YELLOW 02/25/2017 0654   APPEARANCEUR CLEAR 02/25/2017 0654   LABSPEC 1.023 02/25/2017 0654   PHURINE 6.0 02/25/2017 0654   GLUCOSEU NEGATIVE 02/25/2017 0654   HGBUR NEGATIVE 02/25/2017 0654   BILIRUBINUR NEGATIVE 02/25/2017 0654   KETONESUR NEGATIVE 02/25/2017 0654   PROTEINUR >=300 (A) 02/25/2017 0654   NITRITE NEGATIVE 02/25/2017 0654   LEUKOCYTESUR NEGATIVE 02/25/2017 0654   Sepsis Labs: _1 (procalcitonin:4,lacticidven:4)  ) Recent Results (from the past 240 hour(s))  Gram stain     Status: None   Collection Time: 02/22/17  3:36 PM  Result Value Ref Range Status   Specimen Description PLEURAL LEFT  Final   Special Requests NONE  Final   Gram Stain   Final    CYTOSPIN SMEAR WBC PRESENT,BOTH PMN AND MONONUCLEAR NO ORGANISMS SEEN    Report Status 02/23/2017 FINAL  Final  Culture, body fluid-bottle     Status: Abnormal   Collection Time: 02/22/17  3:36 PM  Result Value Ref Range Status   Specimen Description PLEURAL LEFT  Final   Special Requests NONE  Final   Gram Stain   Final    GRAM POSITIVE COCCI IN CLUSTERS AEROBIC BOTTLE ONLY CRITICAL RESULT CALLED TO, READ BACK BY AND VERIFIED WITH: L MCELWEE,RN AT 1820 02/23/17 BY L BENFIELD    Culture STAPHYLOCOCCUS SPECIES (COAGULASE NEGATIVE) (A)  Final   Report Status 02/25/2017 FINAL  Final   Organism ID, Bacteria STAPHYLOCOCCUS SPECIES (COAGULASE NEGATIVE)  Final      Susceptibility   Staphylococcus species (coagulase negative) - MIC*    CIPROFLOXACIN <=0.5 SENSITIVE Sensitive     ERYTHROMYCIN <=0.25 SENSITIVE Sensitive     GENTAMICIN <=0.5 SENSITIVE Sensitive     OXACILLIN <=0.25 SENSITIVE  Sensitive     TETRACYCLINE <=1 SENSITIVE Sensitive     VANCOMYCIN <=0.5 SENSITIVE Sensitive     TRIMETH/SULFA <=10 SENSITIVE Sensitive     CLINDAMYCIN <=0.25 SENSITIVE Sensitive     RIFAMPIN <=0.5 SENSITIVE Sensitive     Inducible Clindamycin NEGATIVE Sensitive     * STAPHYLOCOCCUS SPECIES (COAGULASE NEGATIVE)      Studies: No results found.  Scheduled Meds: . feeding supplement  1 Container Oral TID BM  .  feeding supplement (PRO-STAT SUGAR FREE 64)  30 mL Oral TID BM  . folic acid  1 mg Oral Daily  . furosemide  40 mg Oral Daily  . gabapentin  100 mg Oral QHS  . lidocaine  1 patch Transdermal Q24H  . midodrine  5 mg Oral TID WC  . multivitamin with minerals  1 tablet Oral Daily  . rivaroxaban  20 mg Oral Q supper  . simethicone  80 mg Oral Once  . sodium chloride flush  3 mL Intravenous Q12H  . sodium chloride flush  3 mL Intravenous Q12H  . thiamine  100 mg Oral Daily    Continuous Infusions: . sodium chloride    . sodium chloride       LOS: 7 days    Newly diagnosed AL amyloidosis - Consult hematologist in the morning to discuss treatment options.  Acute on chronic hypoxic respiratory failure -Improving -Most likely multifactorial secondary to fluid overload versus COPD exacerbation versus chronic lung mass -Continue O2 supplement as needed and breathing treatment  Hypoalbuminemia 2/2 chronic proteinuria - Proteinuria reported since 2015 - Nephrology following; we appreciate recommendations. 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 - avoid opioids   Moderate calorie protein malnutrition - Albumin 1.7, total protein 5.3 (02/27/17) - Continue oral supplement - Patient encouraged to increase po protein calorie intake  Hypotension -BP is stable -Continueto maintain MAP greater than 65 -Continue midodrine -Continue close monitoring of vital  signs  Minor skin burn -From electrocardioversion -continue hydrocortisone cream  Anemia -Hg is stable -No reported obvious bleed -Repeat CBC in the morning  COPD mixed type (HCC) -Appears stable at this time -Continue breathing treatment with duonebs  Lung mass -Keep appointment with hemoncology  Fluid overload -Improving -Hold off Lasix due to hypotension  Acute on chronic diastolic CHF (congestive heart failure) (HCC) -Appears stable at this time. 2D Echo EF 60-65% (02/22/17) -Continue lasix 40 mg po daily -Continue close monitoring  Anasarca -Improving -continue lasix 40 mg po daily  Code Status:Full code  Family Communication:I spoke with the patient's wife in the room. All questions answered to their satisfaction.  Disposition Plan:Will stay another midnight due to newly diagnosed AL amyloidosis.  Consultants: Cardiology and nephrology, hematology will be consulted.  Procedures:Renal biopsy 02/27/17  Antimicrobials:None   DVT prophylaxis: Xarelto   Kayleen Memos, MD [facility] Pager [phone]  If 7PM-7AM, please contact night-coverage [email or web address] Password [facility] 02/28/2017, 6:32 PM

## 2017-02-28 NOTE — Progress Notes (Signed)
Occupational Therapy Treatment Patient Details Name: [name] MRN: [id number] DOB: 08/12/1945 Today's Date: 02/28/2017    History of present illness [name] a 71 y.o.malewith medical history significant of PAF on Xarelto status post DCCV 10/12/68, diastolic CHF with LVEF 35-00% on echo 05/14/16, COPD with pulmonary hypertension, s/p right VATS 05/20/16 for empyema underlying lung CA with lymph node biopsy 10/22/16 negative for CA. Admitted for fatigue and generalized swelling.   OT comments  Pt progressing towards acute OT goals. Focus of session was activity tolerance for ADLs and reviewing energy conservation strategies. Spouse present. Pt received on 2L of supplemental O2. On RA during OT session (bed mobility, stood 2-3 minutes, walked to/from bathroom. Breathing techniques incorporated. Pt's O2 sats after in-room ADL tasks was mid-upper 70s-low 80s. Reapplied Accident at 2L at end of session with sats recovering to low 90s. Discussed with pt and family possibility waiting until next venue [facility] therapies) to wean fully off daytime O2.     Follow Up Recommendations  Home health OT;Supervision/Assistance - 24 hour    Equipment Recommendations  Tub/shower seat    Recommendations for Other Services      Precautions / Restrictions Precautions Precautions: Fall Precaution Comments: monitor SpO2 Restrictions Weight Bearing Restrictions: No       Mobility Bed Mobility Overal bed mobility: Needs Assistance Bed Mobility: Supine to Sit     Supine to sit: Min guard     General bed mobility comments: bed rails used for trunk elevation  Transfers Overall transfer level: Needs assistance   Transfers: Sit to/from Stand Sit to Stand: Min guard         General transfer comment: close min guard    Balance Overall balance assessment: Needs assistance Sitting-balance support: Single extremity supported;Feet supported Sitting balance-Leahy Scale: Fair Sitting balance -  Comments: sitting EOB with no back support   Standing balance support: No upper extremity supported;During functional activity Standing balance-Leahy Scale: Poor Standing balance comment: fair static, poor dynamic; uses furniture in room for support during ambulation                           ADL either performed or assessed with clinical judgement   ADL Overall ADL's : Needs assistance/impaired                         Toilet Transfer: Min Psychiatric nurse Details (indicate cue type and reason): close min guard. reaching for external supports.  Toileting- Clothing Manipulation and Hygiene: Set up       Functional mobility during ADLs: Min guard General ADL Comments: Pt is very limited by pain and fatigue. Reviewed energy conservation.     Vision       Perception     Praxis      Cognition Arousal/Alertness: Awake/alert Behavior During Therapy: WFL for tasks assessed/performed Overall Cognitive Status: Within Functional Limits for tasks assessed                                          Exercises     Shoulder Instructions       General Comments      Pertinent Vitals/ Pain       Pain Assessment: Faces Faces Pain Scale: Hurts little more Pain Location: "all over" Pain Descriptors / Indicators: Constant Pain Intervention(s):  Monitored during session  Home Living                                          Prior Functioning/Environment              Frequency  Min 2X/week        Progress Toward Goals  OT Goals(current goals can now be found in the care plan section)  Progress towards OT goals: Progressing toward goals  Acute Rehab OT Goals Patient Stated Goal: start participating in meaningful activities (playing piano again) OT Goal Formulation: With patient/family Time For Goal Achievement: 03/09/17 Potential to Achieve Goals: Fair ADL Goals Pt Will Perform Grooming: standing;with  supervision (taking sitting rest breaks as needed)) Pt Will Transfer to Toilet: with supervision;ambulating Pt Will Perform Toileting - Clothing Manipulation and hygiene: with modified independence;sit to/from stand Additional ADL Goal #1: Pt will recall and implement 3 energy conservation strategies to assist in ADL activity  with less than 2 verbalcues  Plan Discharge plan remains appropriate    Co-evaluation                 AM-PAC PT "6 Clicks" Daily Activity     Outcome Measure   Help from another person eating meals?: None Help from another person taking care of personal grooming?: None Help from another person toileting, which includes using toliet, bedpan, or urinal?: A Little Help from another person bathing (including washing, rinsing, drying)?: A Little Help from another person to put on and taking off regular upper body clothing?: A Little Help from another person to put on and taking off regular lower body clothing?: A Lot 6 Click Score: 19    End of Session Equipment Utilized During Treatment: Other (comment) (received on O2, RA for session, reappled O2 at end of sessio)  OT Visit Diagnosis: Unsteadiness on feet (R26.81);History of falling (Z91.81);Adult, failure to thrive (R62.7);Pain;Muscle weakness (generalized) (M62.81)   Activity Tolerance Patient limited by fatigue;Patient limited by pain   Patient Left in bed;with call bell/phone within reach;with family/visitor present   Nurse Communication          Time: 1032-1050 OT Time Calculation (min): 18 min  Charges: OT General Charges $OT Visit: 1 Visit OT Treatments $Self Care/Home Management : 8-22 mins     Hortencia Pilar 02/28/2017, 11:13 AM

## 2017-02-28 NOTE — Progress Notes (Signed)
Physical Therapy Treatment Patient Details Name: [name] MRN: [id number] DOB: [birth year] Today's Date: 02/28/2017    History of Present Illness [name] a 71 y.o.malewith medical history significant of PAF on Xarelto status post DCCV 05/15/22, diastolic CHF with LVEF 40-10% on echo 05/14/16, COPD with pulmonary hypertension, s/p right VATS 05/20/16 for empyema underlying lung CA with lymph node biopsy 10/22/16 negative for CA. Admitted for fatigue and generalized swelling.    PT Comments    Pt is making good progress towards his goals. Pt currently needs to use supplemental O2 at 2L via nasal cannula to maintain SaO2 above 90% O2 with gait. Oxygen weaning will be a goal of HHPT. Pt currently min guard for transfers and ambulation of 350 feet with no AD. Pt requires skilled PT to progress mobility and improve strength and endurance to safely navigate their discharge environment.      Equipment Recommendations  None recommended by PT    Recommendations for Other Services       Precautions / Restrictions Precautions Precautions: Fall Precaution Comments: monitor SpO2 Restrictions Weight Bearing Restrictions: No    Mobility  Bed Mobility Overal bed mobility: Needs Assistance Bed Mobility: Supine to Sit     Supine to sit: Min guard     General bed mobility comments: bed rails used for trunk elevation  Transfers Overall transfer level: Needs assistance   Transfers: Sit to/from Stand Sit to Stand: Min guard         General transfer comment: close min guard  Ambulation/Gait Ambulation/Gait assistance: Min guard Ambulation Distance (Feet): 350 Feet Assistive device: None Gait Pattern/deviations: Step-through pattern;Narrow base of support Gait velocity: slow Gait velocity interpretation: Below normal speed for age/gender General Gait Details: initially utilized handrail in hall for increased stability, pt stability increased with distance         Balance Overall balance assessment: Needs assistance Sitting-balance support: Single extremity supported;Feet supported Sitting balance-Leahy Scale: Fair Sitting balance - Comments: sitting EOB with no back support   Standing balance support: No upper extremity supported;During functional activity Standing balance-Leahy Scale: Poor Standing balance comment: fair static, poor dynamic; uses furniture in room for support during ambulation                            Cognition Arousal/Alertness: Awake/alert Behavior During Therapy: WFL for tasks assessed/performed Overall Cognitive Status: Within Functional Limits for tasks assessed                                           General Comments General comments (skin integrity, edema, etc.): Pt on 2L O2 via nasal cannula SaO2, see medicare note for O2 needs       Pertinent Vitals/Pain Pain Assessment: Faces Faces Pain Scale: Hurts little more Pain Location: "all over" Pain Descriptors / Indicators: Constant Pain Intervention(s): Monitored during session;Limited activity within patient's tolerance           PT Goals (current goals can now be found in the care plan section) Acute Rehab PT Goals Patient Stated Goal: start participating in meaningful activities (playing piano again) PT Goal Formulation: With patient Time For Goal Achievement: 03/01/17 Potential to Achieve Goals: Good Progress towards PT goals: Progressing toward goals    Frequency    Min 3X/week      PT Plan Current plan remains appropriate  AM-PAC PT "6 Clicks" Daily Activity  Outcome Measure  Difficulty turning over in bed (including adjusting bedclothes, sheets and blankets)?: A Little Difficulty moving from lying on back to sitting on the side of the bed? : A Little Difficulty sitting down on and standing up from a chair with arms (e.g., wheelchair, bedside commode, etc,.)?: A Little Help needed moving to and from a  bed to chair (including a wheelchair)?: A Little Help needed walking in hospital room?: A Little Help needed climbing 3-5 steps with a railing? : A Lot 6 Click Score: 17    End of Session Equipment Utilized During Treatment: Gait belt Activity Tolerance: Patient tolerated treatment well Patient left: in bed;with call bell/phone within reach;with family/visitor present Nurse Communication: Mobility status PT Visit Diagnosis: Difficulty in walking, not elsewhere classified (R26.2)     Time: 5537-4827 PT Time Calculation (min) (ACUTE ONLY): 22 min  Charges:  $Gait Training: 8-22 mins                    G Codes:       Dima Mini B. Migdalia Dk PT, DPT Acute Rehabilitation  [phone] Pager [phone]     [place] 02/28/2017, 1:19 PM

## 2017-02-28 NOTE — Progress Notes (Signed)
I just received a preliminary biopsy result on [name] and unfortunately it is AL amyloidosis.  Will need to consult Hematology in am for discussion on treatment options

## 2017-02-28 NOTE — Progress Notes (Addendum)
Patient ID: [name], male   DOB: July 29, 1945, 71 y.o.   MRN: [id number] P    Advanced Heart Failure Rounding Note  Primary Cardiologist: Radford Pax HF: (new) Shanoah Asbill  Subjective:    Patient was admitted with fatigue and edema. Difficult to diurese due to low BP.  Marked hypoalbuminemia with poor po intake x months.  Urine protein/creatinine ratio 6.67 from 10/14, suggestive of nephrotic-range proteinuria.   Had left thoracentesis on 10/13 with 1.2 L out, looks like transudate but grew coag neg Staph (suspect contaminant).  Now off vancomycin.  Has history of empyema on right.   S/P Successful DC-CV 10/17. Electric burn from procedure. Remains in NSR today.   Nephrotic range proteinuria, 6.2 g/24 hours on collection.  He had renal biopsy on 10/18, pending result.   Denies SOB.  Got IV lasix yesterday with weight down about 3 lbs.   Echo: EF 60-65% with moderate LVH, mild MR, moderately dilated RV, PASP 51 mmHg, moderate TR.   LUE [place]: No LUE DVT  Objective:   Weight Range: 150 lb 12.8 oz (68.4 kg) Body mass index is 21.03 kg/m.   Vital Signs:   Temp:  [97.9 F (36.6 C)-98.2 F (36.8 C)] 97.9 F (36.6 C) (10/19 0524) Pulse Rate:  [71-106] 77 (10/19 0524) Resp:  [10-19] 18 (10/19 0524) BP: (88-123)/(49-78) 99/60 (10/19 0524) SpO2:  [93 %-98 %] 93 % (10/19 0524) Weight:  [150 lb 12.8 oz (68.4 kg)] 150 lb 12.8 oz (68.4 kg) (10/19 0524) Last BM Date: 02/26/17  Weight change: Filed Weights   02/26/17 0557 02/27/17 0354 02/28/17 0524  Weight: 151 lb 14.4 oz (68.9 kg) 153 lb 14.4 oz (69.8 kg) 150 lb 12.8 oz (68.4 kg)    Intake/Output:   Intake/Output Summary (Last 24 hours) at 02/28/17 0903 Last data filed at 02/28/17 0700  Gross per 24 hour  Intake          1049.37 ml  Output             1495 ml  Net          -445.63 ml      Physical Exam    General: Frail/thin Neck: No JVD, no thyromegaly or thyroid nodule.  Lungs: Clear to auscultation bilaterally with normal  respiratory effort. CV: Nondisplaced PMI.  Heart regular S1/S2, no S3/S4, no murmur.  1+ ankle edema bilaterally (improved).  1+ edema left forearm.  Abdomen: Soft, nontender, no hepatosplenomegaly, no distention.  Skin: rash with small papules left forearm.   Neurologic: Alert and oriented x 3.  Psych: Normal affect. Extremities: No clubbing or cyanosis.  HEENT: Normal.    Telemetry  NSR 60s-70s (personally reviewed)  Labs    CBC  Recent Labs  02/26/17 0449 02/27/17 0216  WBC 9.2 8.9  NEUTROABS 4.7 4.9  HGB 9.9* 10.0*  HCT 31.3* 31.4*  MCV 92.6 93.2  PLT 239 726   Basic Metabolic Panel  Recent Labs  02/26/17 0449 02/27/17 0216  NA 135 137  K 4.0 5.0  CL 96* 99*  CO2 33* 32  GLUCOSE 90 103*  BUN 32* 33*  CREATININE 0.93 1.00  CALCIUM 8.0* 8.1*  MG 1.7 2.1   Liver Function Tests  Recent Labs  02/26/17 0449 02/27/17 0216  AST  --  21  ALT  --  14*  ALKPHOS  --  202*  BILITOT  --  0.6  PROT  --  5.3*  ALBUMIN 1.7* 1.7*   No results for input(s):  LIPASE, AMYLASE in the last 72 hours. Cardiac Enzymes No results for input(s): CKTOTAL, CKMB, CKMBINDEX, TROPONINI in the last 72 hours.  BNP: BNP (last 3 results)  Recent Labs  11/19/16 0909 11/22/16 0318 02/21/17 1637  BNP 433.1* 678.9* 626.5*    ProBNP (last 3 results) No results for input(s): PROBNP in the last 8760 hours.   D-Dimer No results for input(s): DDIMER in the last 72 hours. Hemoglobin A1C No results for input(s): HGBA1C in the last 72 hours. Fasting Lipid Panel No results for input(s): CHOL, HDL, LDLCALC, TRIG, CHOLHDL, LDLDIRECT in the last 72 hours. Thyroid Function Tests No results for input(s): TSH, T4TOTAL, T3FREE, THYROIDAB in the last 72 hours.  Invalid input(s): FREET3  Other results:   Imaging    US Biopsy (kidney)  Result Date: 02/27/2017 INDICATION: 71 year old male with proteinuria. EXAM: ULTRASOUND GUIDED RENAL BIOPSY COMPARISON:  None. MEDICATIONS:  Fentanyl 100 mcg IV; Versed 2 mg IV ANESTHESIA/SEDATION: Total Moderate Sedation time 10 minutes COMPLICATIONS: None immediate PROCEDURE: Informed written consent was obtained from the patient after a discussion of the risks, benefits and alternatives to treatment. The patient understands and consents the procedure. A timeout was performed prior to the initiation of the procedure. Ultrasound scanning was performed of the bilateral flanks. The inferior pole of the right kidney was selected for biopsy due to location and sonographic window. The procedure was planned. The operative site was prepped and draped in the usual sterile fashion. The overlying soft tissues were anesthetized with 1% lidocaine with epinephrine. A 17 gauge core needle biopsy device was advanced into the inferior cortex of the right kidney and 2 core biopsies were obtained under direct ultrasound guidance. Images were saved for documentation purposes. The biopsy device was removed and hemostasis was obtained with manual compression. Post procedural scanning was negative for significant post procedural hemorrhage or additional complication. A dressing was placed. The patient tolerated the procedure well without immediate post procedural complication. IMPRESSION: Technically successful ultrasound guided right renal biopsy. Electronically Signed   By: Jacqulynn Cadet M.D.   On: 02/27/2017 14:33     Medications:     Scheduled Medications: . feeding supplement (PRO-STAT SUGAR FREE 64)  30 mL Oral TID BM  . folic acid  1 mg Oral Daily  . furosemide  40 mg Oral Daily  . gabapentin  100 mg Oral QHS  . lidocaine  1 patch Transdermal Q24H  . midodrine  5 mg Oral TID WC  . multivitamin with minerals  1 tablet Oral Daily  . rivaroxaban  20 mg Oral Q supper  . simethicone  80 mg Oral Once  . sodium chloride flush  3 mL Intravenous Q12H  . sodium chloride flush  3 mL Intravenous Q12H  . thiamine  100 mg Oral Daily    Infusions: . sodium  chloride    . sodium chloride      PRN Medications: acetaminophen **OR** acetaminophen, HYDROcodone-acetaminophen, hydrocortisone cream, ondansetron **OR** ondansetron (ZOFRAN) IV, sodium chloride flush, sodium chloride flush, traMADol    Patient Profile   71 yo with persistent atrial fibrillation, chronic diastolic CHF with anasarca, hypoalbuminemia was admitted with CHF and hypotension.   Assessment/Plan   1. Acute on chronic diastolic CHF: Patient has been volume overloaded but diuresis has been difficult due to low BP and hypoalbuminemia.  He has hypoalbuminemia and suspect a component of 3rd-spacing from low protein.  Diuresed better with increased IV Lasix + midodrine.  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  Volume status looks improved.  - Continue midodrine.  - Weight down 15 lbs, think we can switch over to po Lasix => would use 40 mg daily, has been on 20 mg every other day at home.   - Pending BMET today, send stat.   - Continue ted hose, trying to get lymphedema sleeve for left arm.   2. Hypoalbuminemia: With 3rd spacing.  Initially thought due to poor po intake x months.  Has seen nutrition and is eating better.  However, spot urine protein/creatinine ratio was 6.67 and repeated as an early am test => 5.32, 24 hour urine collection with 6.2 g protein.  This is suggestive of nephrotic syndrome.   - He has had renal biopsy and we await results.    3. Anorexia: ?Etiology.  Has history of prostate cancer and has been noted to have enlarged mediastinal nodes, but so far workup of lymphadenopathy has not been revealing of cancer. With abnormal SPEP/UPEP, multiple myeloma is a concern.  4. Atrial fibrillation: NSR after DCCV.  - Transition from IV heparin (post-renal biopsy) back to Xarelto today.  5. Pleural effusions: Bilateral, partially loculated on right.  Had left thoracentesis, fluid appeared to be a  transudate but growing GPCs => coag negative Staph, likely contaminant.   - Afebrile. No WBC. Vancomycin stopped.  6. Nephrotic syndrome: Associate with M-spike on SPEP and UPEP.  ?AL amyloidosis versus multiple myeloma as cause of nephrotic syndrome. Moderate LVH on echo, could also have presence of cardiac amyloidosis.  Await pathology from renal biopsy that was done yesterday. 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  - If renal biopsy shows amyloidosis, probably can hold off on cardiac MRI.  If not, would consider cardiac MRI to see if there is a delayed enhancement pattern suggestive of amyloidosis.  7. Electric Burn after DC-CV on chest and back. Continue hydrocortisone to chest and apply Allevyn to back.   .Length of Stay: 7  [name], MD  02/28/2017, 9:03 AM  Advanced Heart Failure Team Pager [phone] (M-F; 7a - 4p)  Please contact [facility] for night-coverage after hours (4p -7a ) and weekends on [email or web address]

## 2017-03-01 DIAGNOSIS — R0602 Shortness of breath: Secondary | ICD-10-CM

## 2017-03-01 DIAGNOSIS — N049 Nephrotic syndrome with unspecified morphologic changes: Secondary | ICD-10-CM

## 2017-03-01 DIAGNOSIS — E8581 Light chain (AL) amyloidosis: Secondary | ICD-10-CM

## 2017-03-01 LAB — CBC WITH DIFFERENTIAL/PLATELET
BASOS PCT: 1 %
Basophils Absolute: 0.1 10*3/uL (ref 0.0–0.1)
EOS ABS: 0.2 10*3/uL (ref 0.0–0.7)
Eosinophils Relative: 3 %
HCT: 32.2 % — ABNORMAL LOW (ref 39.0–52.0)
HEMOGLOBIN: 10.2 g/dL — AB (ref 13.0–17.0)
Lymphocytes Relative: 27 %
Lymphs Abs: 2.4 10*3/uL (ref 0.7–4.0)
MCH: 29.5 pg (ref 26.0–34.0)
MCHC: 31.7 g/dL (ref 30.0–36.0)
MCV: 93.1 fL (ref 78.0–100.0)
MONOS PCT: 19 %
Monocytes Absolute: 1.7 10*3/uL — ABNORMAL HIGH (ref 0.1–1.0)
NEUTROS PCT: 50 %
Neutro Abs: 4.5 10*3/uL (ref 1.7–7.7)
Platelets: 204 10*3/uL (ref 150–400)
RBC: 3.46 MIL/uL — ABNORMAL LOW (ref 4.22–5.81)
RDW: 15.7 % — ABNORMAL HIGH (ref 11.5–15.5)
WBC: 8.9 10*3/uL (ref 4.0–10.5)

## 2017-03-01 LAB — LACTATE DEHYDROGENASE: LDH: 148 U/L (ref 98–192)

## 2017-03-01 LAB — BASIC METABOLIC PANEL
Anion gap: 6 (ref 5–15)
BUN: 35 mg/dL — ABNORMAL HIGH (ref 6–20)
CALCIUM: 7.9 mg/dL — AB (ref 8.9–10.3)
CO2: 31 mmol/L (ref 22–32)
CREATININE: 0.9 mg/dL (ref 0.61–1.24)
Chloride: 97 mmol/L — ABNORMAL LOW (ref 101–111)
GFR calc non Af Amer: >60 mL/min (ref >60)
Glucose, Bld: 93 mg/dL (ref 65–99)
Potassium: 3.6 mmol/L (ref 3.5–5.1)
SODIUM: 134 mmol/L — AB (ref 135–145)

## 2017-03-01 LAB — SEDIMENTATION RATE: SED RATE: 121 mm/h — AB (ref 0–16)

## 2017-03-01 NOTE — Progress Notes (Signed)
Pt is currently in bed with eyes open when he had 6-beats run of V-Tach. MD has been notified but she recommends that we watch the patient closely. Pt has denied any chest pain, or discomfort. Will continue monitoring.

## 2017-03-01 NOTE — Progress Notes (Signed)
Oncology consult received from Dr Meredeth Ide. Patient will be seen today. Thanks Sullivan Lone MD MS

## 2017-03-01 NOTE — Progress Notes (Signed)
PROGRESS NOTE  [name] EVO:[id number] DOB: 1945/12/25 DOA: 02/21/2017 PCP: Flossie Buffy, NP  HPI/Recap of past 24 hours: [name] is a 71 year old male with past medical history significant for paroxysmal A. fib on Xarelto,diastolic CHF with EF of 29-93% (05/14/16),COPD on 2L O2 at night,pulmonary hypertension status post right VATS in May 20, 2016 due toempyema, history of lung mass,who presented on 10/12/18with complaints of fatigue and generalized swelling. Patient was admitted for acute hypoxic respiratory failure, fluid overload, and chronic proteinuriawith hypoalbuminemia.  Overnight reported 6 beats run of V. Tach. by RN. This morning, patient denies chest pain, dyspnea, or palpitations.  Admits to generalized fatigue and poor appetite.  Patient has been informed of new diagnosis of AL amyloidosis. Oncology consult placed.  Exam:   General:A 71 year old Caucasian male,lying in bed on 2L O2 [place]. No acute distress.  Cardiovascular:Regular rate and rhythm with no murmurs or rubs noted.Trace edema in lower extremities bilaterally.  Respiratory:Lungs are clear to auscultation bilaterally.No wheezes or rhonchi noted.  Abdomen:Soft nontender nondistended with normal bowel sounds.  Musculoskeletal: Strength intact in all 4 extremities.Moves all limbs without any difficulties.  Skin:Erythema noted on left side of chest and back.  Psychiatry:Normal mood and affect for condition and circumstances   Objective: Vitals:   02/28/17 2230 03/01/17 0252 03/01/17 0622 03/01/17 0900  BP: (!) 101/58 (!) 94/52 106/68 (!) 90/49  Pulse: 81 79 80 83  Resp:   18   Temp:   98 F (36.7 C)   TempSrc:   Oral   SpO2: 96% 97% 98%   Weight:   69 kg (152 lb 3.2 oz)   Height:        Intake/Output Summary (Last 24 hours) at 03/01/17 1552 Last data filed at 03/01/17 1014  Gross per 24 hour  Intake              120 ml  Output              850 ml  Net              -730 ml   Filed Weights   02/27/17 0354 02/28/17 0524 03/01/17 0622  Weight: 69.8 kg (153 lb 14.4 oz) 68.4 kg (150 lb 12.8 oz) 69 kg (152 lb 3.2 oz)    Data Reviewed: CBC:  Recent Labs Lab 02/24/17 0516 02/26/17 0449 02/27/17 0216 02/28/17 1115 03/01/17 0442  WBC 7.9 9.2 8.9 8.2 8.9  NEUTROABS 4.2 4.7 4.9 5.3 4.5  HGB 9.7* 9.9* 10.0* 11.8* 10.2*  HCT 30.5* 31.3* 31.4* 36.2* 32.2*  MCV 92.4 92.6 93.2 93.5 93.1  PLT 271 239 210 221 716   Basic Metabolic Panel:  Recent Labs Lab 02/24/17 0516 02/25/17 0504 02/26/17 0449 02/27/17 0216 02/28/17 1115 03/01/17 0442  NA 137 135 135 137 136 134*  K 3.7 3.6 4.0 5.0 3.6 3.6  CL 100* 98* 96* 99* 97* 97*  CO2 29 30 33* 32 32 31  GLUCOSE 85 97 90 103* 88 93  BUN 26* 27* 32* 33* 31* 35*  CREATININE 0.98 0.89 0.93 1.00 0.94 0.90  CALCIUM 8.2* 8.0* 8.0* 8.1* 8.4* 7.9*  MG 1.7 1.7 1.7 2.1 2.0  --    GFR: Estimated Creatinine Clearance: 71.3 mL/min (by C-G formula based on SCr of 0.9 mg/dL). Liver Function Tests:  Recent Labs Lab 02/23/17 0445 02/26/17 0449 02/27/17 0216 02/28/17 1115  AST 20  --  21 25  ALT 14*  --  14* 16*  ALKPHOS  179*  --  202* 222*  BILITOT 0.5  --  0.6 0.5  PROT 5.3*  --  5.3* 6.5  ALBUMIN 1.6* 1.7* 1.7* 1.9*   No results for input(s): LIPASE, AMYLASE in the last 168 hours. No results for input(s): AMMONIA in the last 168 hours. Coagulation Profile:  Recent Labs Lab 02/27/17 0216  INR 1.44   Cardiac Enzymes: No results for input(s): CKTOTAL, CKMB, CKMBINDEX, TROPONINI in the last 168 hours. BNP (last 3 results) No results for input(s): PROBNP in the last 8760 hours. HbA1C: No results for input(s): HGBA1C in the last 72 hours. CBG: No results for input(s): GLUCAP in the last 168 hours. Lipid Profile: No results for input(s): CHOL, HDL, LDLCALC, TRIG, CHOLHDL, LDLDIRECT in the last 72 hours. Thyroid Function Tests: No results for input(s): TSH, T4TOTAL, FREET4, T3FREE, THYROIDAB in  the last 72 hours. Anemia Panel: No results for input(s): VITAMINB12, FOLATE, FERRITIN, TIBC, IRON, RETICCTPCT in the last 72 hours. Urine analysis:    Component Value Date/Time   COLORURINE YELLOW 02/25/2017 0654   APPEARANCEUR CLEAR 02/25/2017 0654   LABSPEC 1.023 02/25/2017 0654   PHURINE 6.0 02/25/2017 0654   GLUCOSEU NEGATIVE 02/25/2017 0654   HGBUR NEGATIVE 02/25/2017 0654   BILIRUBINUR NEGATIVE 02/25/2017 0654   KETONESUR NEGATIVE 02/25/2017 0654   PROTEINUR >=300 (A) 02/25/2017 0654   NITRITE NEGATIVE 02/25/2017 0654   LEUKOCYTESUR NEGATIVE 02/25/2017 0654   Sepsis Labs: _0 (procalcitonin:4,lacticidven:4)  ) Recent Results (from the past 240 hour(s))  Gram stain     Status: None   Collection Time: 02/22/17  3:36 PM  Result Value Ref Range Status   Specimen Description PLEURAL LEFT  Final   Special Requests NONE  Final   Gram Stain   Final    CYTOSPIN SMEAR WBC PRESENT,BOTH PMN AND MONONUCLEAR NO ORGANISMS SEEN    Report Status 02/23/2017 FINAL  Final  Culture, body fluid-bottle     Status: Abnormal   Collection Time: 02/22/17  3:36 PM  Result Value Ref Range Status   Specimen Description PLEURAL LEFT  Final   Special Requests NONE  Final   Gram Stain   Final    GRAM POSITIVE COCCI IN CLUSTERS AEROBIC BOTTLE ONLY CRITICAL RESULT CALLED TO, READ BACK BY AND VERIFIED WITH: L MCELWEE,RN AT 1820 02/23/17 BY L BENFIELD    Culture STAPHYLOCOCCUS SPECIES (COAGULASE NEGATIVE) (A)  Final   Report Status 02/25/2017 FINAL  Final   Organism ID, Bacteria STAPHYLOCOCCUS SPECIES (COAGULASE NEGATIVE)  Final      Susceptibility   Staphylococcus species (coagulase negative) - MIC*    CIPROFLOXACIN <=0.5 SENSITIVE Sensitive     ERYTHROMYCIN <=0.25 SENSITIVE Sensitive     GENTAMICIN <=0.5 SENSITIVE Sensitive     OXACILLIN <=0.25 SENSITIVE Sensitive     TETRACYCLINE <=1 SENSITIVE Sensitive     VANCOMYCIN <=0.5 SENSITIVE Sensitive     TRIMETH/SULFA <=10 SENSITIVE  Sensitive     CLINDAMYCIN <=0.25 SENSITIVE Sensitive     RIFAMPIN <=0.5 SENSITIVE Sensitive     Inducible Clindamycin NEGATIVE Sensitive     * STAPHYLOCOCCUS SPECIES (COAGULASE NEGATIVE)      Studies: No results found.  Scheduled Meds: . feeding supplement  1 Container Oral TID BM  . feeding supplement (PRO-STAT SUGAR FREE 64)  30 mL Oral TID BM  . folic acid  1 mg Oral Daily  . furosemide  40 mg Oral Daily  . gabapentin  200 mg Oral QHS  . lidocaine  1 patch Transdermal Q24H  .  midodrine  5 mg Oral TID WC  . multivitamin with minerals  1 tablet Oral Daily  . rivaroxaban  20 mg Oral Q supper  . simethicone  80 mg Oral Once  . sodium chloride flush  3 mL Intravenous Q12H  . sodium chloride flush  3 mL Intravenous Q12H  . thiamine  100 mg Oral Daily    Continuous Infusions: . sodium chloride    . sodium chloride       LOS: 8 days   Newly diagnosed AL amyloidosis - Hematology consulted today to discuss treatment options.  Acute on chronic hypoxic respiratory failure -Improving -Most likely multifactorial secondary to fluid overload versus COPD exacerbation versus chronic lung mass -Continue O2 supplement as needed and breathing treatment  Hypoalbuminemia 2/2 chronic proteinuria - Proteinuria reported since 2015 - Nephrology following; we appreciate recommendations. 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 - avoid opioids   Moderate calorie protein malnutrition - Albumin 1.7, total protein 5.3 (02/27/17) - Continue oral supplement - Patient encouraged to increase po protein calorie intake  Hypotension -BP is stable -Continueto maintain MAPgreater than 65 -Continue midodrine -Continue close monitoring of vital signs  Minor skin burn -From electrocardioversion -continue hydrocortisone cream  Anemia -Hg is stable -No reported obvious  bleed -Repeat CBC in the morning  COPD mixed type (HCC) -Appears stable at this time -Continue breathing treatment with duonebs  Lung mass -Keep appointment with hemoncology  Fluid overload -Improving -Hold off Lasix due to hypotension  Acute on chronic diastolic CHF (congestive heart failure) (HCC) -Appears stable at this time. 2D Echo EF 60-65% (02/22/17) -Continue lasix 40 mg po daily -Continue close monitoring  Anasarca -Improving -continue lasix 40 mg po daily  Code Status:Full code  Family Communication:I spoke with the patient's wife in the room. All questions answered to their satisfaction.  Disposition Plan:Will stay anothermidnight due to newly diagnosed AL amyloidosis and persistent fatigue.  Heme oncology will evaluate today.  Consultants: Cardiology, nephrology, hematology.  Procedures:Renal biopsy 02/27/17- AL amyloidosis.  Antimicrobials:None   DVT prophylaxis: Xarelto   Kayleen Memos, MD [facility] Pager [phone]-  If 7PM-7AM, please contact night-coverage [email or web address] Password [email or web address] 03/01/2017, 3:52 PM

## 2017-03-01 NOTE — Progress Notes (Signed)
S: Continues to feel fatigued today, otherwise no new symptomatic concerns.  O:BP (!) 90/49 (BP Location: Right Arm)   Pulse 83   Temp 98 F (36.7 C) (Oral)   Resp 18   Ht _0  (1.803 m)   Wt 152 lb 3.2 oz (69 kg) Comment: scale a  SpO2 98%   BMI 21.23 kg/m   Intake/Output Summary (Last 24 hours) at 03/01/17 1404 Last data filed at 03/01/17 1014  Gross per 24 hour  Intake              360 ml  Output             1050 ml  Net             -690 ml   Intake/Output: I/O last 3 completed shifts: In: 1169.4 [P.O.:960; I.V.:209.4] Out: 1770 [Urine:1770]  Intake/Output this shift:  Total I/O In: 120 [P.O.:120] Out: 100 [Urine:100] Weight change: 1 lb 6.4 oz (0.635 kg) QMV:HQIONGEXBMW ill appearing  CVS:RRR, no murmur  Resp:lungs clear to auscultation  UXL:KGMW, non tender, non distended  Ext: trace bilateral upper extremity edema, 1+ right lower extremity edema, no calf swelling or tenderness    Recent Labs Lab 02/23/17 0445 02/24/17 0516 02/25/17 0504 02/26/17 0449 02/27/17 0216 02/28/17 1115 03/01/17 0442  NA 137 137 135 135 137 136 134*  K 4.2 3.7 3.6 4.0 5.0 3.6 3.6  CL 105 100* 98* 96* 99* 97* 97*  CO2 _1 33* 32 32 31  GLUCOSE 89 85 97 90 103* 88 93  BUN 24* 26* 27* 32* 33* 31* 35*  CREATININE 0.92 0.98 0.89 0.93 1.00 0.94 0.90  ALBUMIN 1.6*  --   --  1.7* 1.7* 1.9*  --   CALCIUM 7.9* 8.2* 8.0* 8.0* 8.1* 8.4* 7.9*  AST 20  --   --   --  21 25  --   ALT 14*  --   --   --  14* 16*  --    Liver Function Tests:  Recent Labs Lab 02/23/17 0445 02/26/17 0449 02/27/17 0216 02/28/17 1115  AST 20  --  21 25  ALT 14*  --  14* 16*  ALKPHOS 179*  --  202* 222*  BILITOT 0.5  --  0.6 0.5  PROT 5.3*  --  5.3* 6.5  ALBUMIN 1.6* 1.7* 1.7* 1.9*   No results for input(s): LIPASE, AMYLASE in the last 168 hours. No results for input(s): AMMONIA in the last 168 hours. CBC:  Recent Labs Lab 02/24/17 0516 02/26/17 0449 02/27/17 0216 02/28/17 1115  03/01/17 0442  WBC 7.9 9.2 8.9 8.2 8.9  NEUTROABS 4.2 4.7 4.9 5.3 4.5  HGB 9.7* 9.9* 10.0* 11.8* 10.2*  HCT 30.5* 31.3* 31.4* 36.2* 32.2*  MCV 92.4 92.6 93.2 93.5 93.1  PLT 271 239 210 221 204   Cardiac Enzymes: No results for input(s): CKTOTAL, CKMB, CKMBINDEX, TROPONINI in the last 168 hours. CBG: No results for input(s): GLUCAP in the last 168 hours.  Iron Studies: No results for input(s): IRON, TIBC, TRANSFERRIN, FERRITIN in the last 72 hours. Studies/Results: US Biopsy (kidney)  Result Date: 02/27/2017 INDICATION: 71 year old male with proteinuria. EXAM: ULTRASOUND GUIDED RENAL BIOPSY COMPARISON:  None. MEDICATIONS: Fentanyl 100 mcg IV; Versed 2 mg IV ANESTHESIA/SEDATION: Total Moderate Sedation time 10 minutes COMPLICATIONS: None immediate PROCEDURE: Informed written consent was obtained from the patient after a discussion of the risks, benefits and alternatives to treatment. The patient understands and consents the procedure. A  timeout was performed prior to the initiation of the procedure. Ultrasound scanning was performed of the bilateral flanks. The inferior pole of the right kidney was selected for biopsy due to location and sonographic window. The procedure was planned. The operative site was prepped and draped in the usual sterile fashion. The overlying soft tissues were anesthetized with 1% lidocaine with epinephrine. A 17 gauge core needle biopsy device was advanced into the inferior cortex of the right kidney and 2 core biopsies were obtained under direct ultrasound guidance. Images were saved for documentation purposes. The biopsy device was removed and hemostasis was obtained with manual compression. Post procedural scanning was negative for significant post procedural hemorrhage or additional complication. A dressing was placed. The patient tolerated the procedure well without immediate post procedural complication. IMPRESSION: Technically successful ultrasound guided right  renal biopsy. Electronically Signed   By: Jacqulynn Cadet M.D.   On: 02/27/2017 14:33   . feeding supplement  1 Container Oral TID BM  . feeding supplement (PRO-STAT SUGAR FREE 64)  30 mL Oral TID BM  . folic acid  1 mg Oral Daily  . furosemide  40 mg Oral Daily  . gabapentin  200 mg Oral QHS  . lidocaine  1 patch Transdermal Q24H  . midodrine  5 mg Oral TID WC  . multivitamin with minerals  1 tablet Oral Daily  . rivaroxaban  20 mg Oral Q supper  . simethicone  80 mg Oral Once  . sodium chloride flush  3 mL Intravenous Q12H  . sodium chloride flush  3 mL Intravenous Q12H  . thiamine  100 mg Oral Daily    BMET    Component Value Date/Time   NA 134 (L) 03/01/2017 0442   NA 139 01/09/2017 1256   K 3.6 03/01/2017 0442   CL 97 (L) 03/01/2017 0442   CO2 31 03/01/2017 0442   GLUCOSE 93 03/01/2017 0442   BUN 35 (H) 03/01/2017 0442   BUN 26 01/09/2017 1256   CREATININE 0.90 03/01/2017 0442   CREATININE 0.84 06/12/2014 0922   CALCIUM 7.9 (L) 03/01/2017 0442   GFRNONAA >60 03/01/2017 0442   GFRAA >60 03/01/2017 0442   CBC    Component Value Date/Time   WBC 8.9 03/01/2017 0442   RBC 3.46 (L) 03/01/2017 0442   HGB 10.2 (L) 03/01/2017 0442   HGB 13.8 10/04/2016 1134   HCT 32.2 (L) 03/01/2017 0442   HCT 41.5 10/04/2016 1134   PLT 204 03/01/2017 0442   PLT 469 (H) 10/04/2016 1134   MCV 93.1 03/01/2017 0442   MCV 90 10/04/2016 1134   MCH 29.5 03/01/2017 0442   MCHC 31.7 03/01/2017 0442   RDW 15.7 (H) 03/01/2017 0442   RDW 14.9 10/04/2016 1134   LYMPHSABS 2.4 03/01/2017 0442   MONOABS 1.7 (H) 03/01/2017 0442   EOSABS 0.2 03/01/2017 0442   BASOSABS 0.1 03/01/2017 0442     Assessment/Plan:  1. 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 Will need hematology input on treatment options and patient counseling for this.   2. Decreased urine output, resolved 3. Volume overload -  peripheral edema continues to improve, now on po lasix 40 mg daily  4. Acute on chronic congestive heart failure- received IV albumin, midodrine, and lasix, heart failureis managing  5. Pleural effusion - transudate with GPCs are though to be contaminant, vanc stopped, afebrile and without leukocytosis, CXR showedworsening despite thoracentesis 10/13 with 1.2  L out 6. persistent afib- NSR today, on xarelto  7.Chronic Hypotension -stable, still asymptomatic   Ledell Noss, MD   I have seen and examined this patient and agree with plan and assessment in the above note with renal recommendations/intervention highlighted.  Discussed case with Irene Limbo who has kindly agreed to see [name] today.  Await further plans.  Thankfully his renal function and volume status are stable. Governor Rooks Bethanny Toelle,MD 03/01/2017 2:26 PM   Donetta Potts, MD [facility] [phone]

## 2017-03-01 NOTE — Consult Note (Signed)
[name]    HEMATOLOGY/ONCOLOGY CONSULTATION NOTE  Date of Service: 03/01/2017  Patient Care Team: Nche, Charlene Brooke, NP as PCP - General (Internal Medicine) Donita Brooks, MD as Attending Physician (Internal Medicine) Mack Guise. Lacinda Axon, MD as Consulting Physician (Urology)  CHIEF COMPLAINTS/PURPOSE OF CONSULTATION:  AL Amyloidosis  HISTORY OF PRESENTING ILLNESS:  [name] is a wonderful 71 y.o. 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  Patient follows with Dr Alen Blew for his h/o prostate cancer and was found to have borderline lymphadenopathy dating back to January 2018. He had a mediastinal adenopathy that was borderline detected on a CT scan. A repeat CT scan on 05/17/2016 showed persistent enlarged mediastinal lymph nodes and a left internal mammary lymph node. Differential considerations include lymphoproliferative disorder, granulomatous inflammation/infection or reactive adenopathy. left cervical LN Bx in 10/2016 was unrevealing.  Patient has multiple co-morbidities including PAF on Xarelto status post DCCV 09/13/95, diastolic CHF with LVEF 67-34% on echo 05/14/16, COPD with pulmonary hypertension on home O2 at night time s/p right VATS 05/20/16 for empyema. He was admitted with  Worsening fatigue and generalized swelling.  He was noted to have hypoalbuminemia and further w/u demonstrated nephrotic range proteinuria of about 6g per day. Patient's nephrology w/u showed monoclonal paraproteinemia with M spike of 1.1g/dl. He subsequently had a kidney biopsy on 02/27/2017 - which per report --prelim showed AL Amyoidosis. Final pathology pending. Patient is being diuresed aggresivel. ECHO 10/13 showed EF of 19-37%  Diastolic function could not be evaluated due to Atrial fibrillation.  I discussed the available results in details with the patient and his wife at bedside. He has no focal  bone pain, Has some anemia. No overt hypercalcemia at this time.  MEDICAL HISTORY:  Past Medical History:  Diagnosis Date  . Anxiety   . Arthritis    "neck" (08/15/2015)  . Chronic bronchitis ([place])   . Chronic diastolic CHF (congestive heart failure) ([place])   . Constipation   . COPD (chronic obstructive pulmonary disease) ([place])   . DDD (degenerative disc disease), cervical   . Depression   . Dilated aortic root ([place])    72m on echo 05/2016  . Edema of left lower extremity   . Facial basal cell cancer   . H/O acne vulgaris 1960s   [facility]  . Headache    history of - left temporal- years ago- not current (08/15/2015)  . Persistent atrial fibrillation (HCC)    on coumadin with CHADS2VASC score of 2  . Pneumonia 1960s; 2015 X 2  . [place] 2000[place]   "low spreading; non aggressive type" (08/15/2015)  . Pulmonary HTN ([place]    PASP 424mg by echo 05/2016  . Skin cancer    "back"    SURGICAL HISTORY: Past Surgical History:  Procedure Laterality Date  . CARDIOVERSION N/A 10/11/2016   Procedure: CARDIOVERSION;  Surgeon: CrSanda KleinMD;  [facility];  Service: Cardiovascular;  Laterality: N/A;  . CARDIOVERSION N/A 02/26/2017   Procedure: CARDIOVERSION;  Surgeon: McLarey DresserMD;  [facility];  Service: Cardiovascular;  Laterality: N/A;  . COLONOSCOPY    . EXCISIONAL HEMORRHOIDECTOMY  1960s  . INGUINAL HERNIA REPAIR Right 2002  . INGUINAL HERNIA REPAIR Bilateral 08/15/2015  . INGUINAL HERNIA REPAIR Bilateral 08/15/2015   Procedure: OPEN REPAIR RECURRENT RIGHT INGUINAL  HERNIA WITH MESH AND REPAIR LEFT INGUINAL HERNIA WITH MESH;  Surgeon: [name], MD;  Location: [place];  Service: General;  Laterality: Bilateral;  . INSERTION OF MESH Bilateral 08/15/2015   Procedure: INSERTION OF MESH;  Surgeon: [name], MD;  Location: [place];  Service: General;  Laterality: Bilateral;  . IR THORACENTESIS  ASP PLEURAL SPACE W/IMG GUIDE  11/18/2016  . LYMPH NODE BIOPSY Bilateral 10/22/2016   Procedure: LEFT NECK LYMPH NODE BIOPSY;  Surgeon: [name], MD;  Location: [place];  Service: Thoracic;  Laterality: Bilateral;  . PROSTATE BIOPSY    . TONSILLECTOMY  1950s  . VIDEO ASSISTED THORACOSCOPY (VATS)/EMPYEMA Right 05/20/2016   Procedure: VIDEO ASSISTED THORACOSCOPY (VATS) with drainage of pleural effusion;  Surgeon: [name], MD;  Location: [place];  Service: Thoracic;  Laterality: Right;  [name] VIDEO BRONCHOSCOPY WITH ENDOBRONCHIAL ULTRASOUND Right 05/20/2016   Procedure: VIDEO BRONCHOSCOPY WITH ENDOBRONCHIAL ULTRASOUND;  Surgeon: [name], MD;  Location: [place];  Service: Thoracic;  Laterality: Right;    SOCIAL HISTORY: Social History   Social History  . Marital status: Married    Spouse name: N/A  . Number of children: N/A  . Years of education: N/A   Occupational History  . Not on file.   Social History Main Topics  . Smoking status: Former Smoker    Packs/day: 1.00    Years: 62.00    Types: Cigarettes    Quit date: 05/14/2016  . Smokeless tobacco: Never Used  . Alcohol use No  . Drug use: No     Comment: CBD OIL  . Sexual activity: Not Currently    Partners: Female   Other Topics Concern  . Not on file   Social History Narrative  . No narrative on file    FAMILY HISTORY: Family History  Problem Relation Age of Onset  . Colon cancer Mother   . Ovarian cancer Mother   . Alcoholism Father   . CAD Father   . Alcohol abuse Father   . Ovarian cancer Sister   . Diabetes Neg Hx   . Stomach cancer Neg Hx     ALLERGIES:  is allergic to demerol [meperidine] and oxycodone hcl.  MEDICATIONS:  Current Facility-Administered Medications  Medication Dose Route Frequency Provider Last Rate Last Dose  . 0.9 %  sodium chloride infusion  250 mL Intravenous Continuous Ena Dawley H, MD      . 0.9 %  sodium chloride infusion  250 mL Intravenous Continuous [name], MD      . acetaminophen (TYLENOL) tablet 650 mg  650 mg Oral Q6H PRN Toy Baker, MD       Or  . acetaminophen (TYLENOL) suppository 650 mg  650 mg Rectal Q6H PRN Doutova, Anastassia, MD      . feeding supplement (BOOST / RESOURCE BREEZE) liquid 1 Container  1 Container Oral TID BM Kayleen Memos, DO   1 Container at 03/01/17 2045  . feeding supplement (PRO-STAT SUGAR FREE 64) liquid 30 mL  30 mL Oral TID BM Starla Link, Kshitiz, MD   30 mL at 03/01/17 2229  . folic acid (FOLVITE) tablet 1 mg  1 mg Oral Daily Doutova, Anastassia, MD   1 mg at 03/01/17 0941  . furosemide (LASIX) tablet 40 mg  40 mg Oral Daily [name], MD   40 mg at 03/01/17 0941  . gabapentin (NEURONTIN) capsule 200 mg  200 mg Oral QHS Hall, Carole N, DO      .  HYDROcodone-acetaminophen (NORCO/VICODIN) 5-325 MG per tablet 1-2 tablet  1-2 tablet Oral Q4H PRN Toy Baker, MD   2 tablet at 03/01/17 2228  . hydrocortisone cream 1 % 1 application  1 application Topical QID PRN [name], MD   1 application at 79/02/40 1813  . lidocaine (LIDODERM) 5 % 1 patch  1 patch Transdermal Q24H Aline August, MD   1 patch at 02/25/17 1049  . midodrine (PROAMATINE) tablet 5 mg  5 mg Oral TID WC Hall, Carole N, DO   5 mg at 03/01/17 1741  . multivitamin with minerals tablet 1 tablet  1 tablet Oral Daily Toy Baker, MD   1 tablet at 03/01/17 0941  . ondansetron (ZOFRAN) tablet 4 mg  4 mg Oral Q6H PRN Doutova, Anastassia, MD       Or  . ondansetron (ZOFRAN) injection 4 mg  4 mg Intravenous Q6H PRN Doutova, Anastassia, MD      . rivaroxaban (XARELTO) tablet 20 mg  20 mg Oral Q supper [name], MD   20 mg at 03/01/17 1741  . simethicone (MYLICON) chewable tablet 80 mg  80 mg Oral Once Blount, Scarlette Shorts T, NP      . sodium chloride flush (NS) 0.9 % injection 3 mL  3 mL Intravenous Q12H Dorothy Spark, MD   3 mL at 03/01/17 2229  . sodium chloride flush (NS) 0.9 % injection 3 mL  3 mL Intravenous PRN  Dorothy Spark, MD      . sodium chloride flush (NS) 0.9 % injection 3 mL  3 mL Intravenous Q12H [name], MD   3 mL at 02/27/17 1000  . sodium chloride flush (NS) 0.9 % injection 3 mL  3 mL Intravenous PRN [name], MD      . thiamine (VITAMIN B-1) tablet 100 mg  100 mg Oral Daily Doutova, Anastassia, MD   100 mg at 03/01/17 0941  . traMADol (ULTRAM) tablet 50 mg  50 mg Oral Q6H PRN Aline August, MD        REVIEW OF SYSTEMS:    10 Point review of Systems was done is negative except as noted above.  PHYSICAL EXAMINATION: ECOG PERFORMANCE STATUS: 2 - Symptomatic, <50% confined to bed  . Vitals:   03/01/17 0622 03/01/17 0900  BP: 106/68 (!) 90/49  Pulse: 80 83  Resp: 18   Temp: 98 F (36.7 C)   SpO2: 98%    Filed Weights   02/27/17 0354 02/28/17 0524 03/01/17 0622  Weight: 153 lb 14.4 oz (69.8 kg) 150 lb 12.8 oz (68.4 kg) 152 lb 3.2 oz (69 kg)   .Body mass index is 21.23 kg/m.  GENERAL:alert, in no acute distress and comfortable SKIN: no acute rashes, no significant lesions EYES: conjunctiva are pink and non-injected, sclera anicteric OROPHARYNX: MMM, no exudates, no oropharyngeal erythema or ulceration NECK: supple, no JVD LYMPH:  no palpable lymphadenopathy in the cervical, axillary or inguinal regions LUNGS: clear to auscultation b/l with normal respiratory effort HEART: regular rate & rhythm ABDOMEN:  normoactive bowel sounds , non tender, not distended. Extremity: 2+pitting pedal edema PSYCH: alert & oriented x 3 with fluent speech NEURO: no focal motor/sensory deficits  LABORATORY DATA:  I have reviewed the data as listed  . CBC Latest Ref Rng & Units 03/01/2017 02/28/2017 02/27/2017  WBC 4.0 - 10.5 K/uL 8.9 8.2 8.9  Hemoglobin 13.0 - 17.0 g/dL 10.2(L) 11.8(L) 10.0(L)  Hematocrit 39.0 - 52.0 % 32.2(L) 36.2(L) 31.4(L)  Platelets 150 - 400 K/uL 204 221 210    CBC    Component Value Date/Time   WBC 8.9 03/01/2017 0442   RBC 3.46 (L)  03/01/2017 0442   HGB 10.2 (L) 03/01/2017 0442   HGB 13.8 10/04/2016 1134   HCT 32.2 (L) 03/01/2017 0442   HCT 41.5 10/04/2016 1134   PLT 204 03/01/2017 0442   PLT 469 (H) 10/04/2016 1134   MCV 93.1 03/01/2017 0442   MCV 90 10/04/2016 1134   MCH 29.5 03/01/2017 0442   MCHC 31.7 03/01/2017 0442   RDW 15.7 (H) 03/01/2017 0442   RDW 14.9 10/04/2016 1134   LYMPHSABS 2.4 03/01/2017 0442   MONOABS 1.7 (H) 03/01/2017 0442   EOSABS 0.2 03/01/2017 0442   BASOSABS 0.1 03/01/2017 0442   . CMP Latest Ref Rng & Units 03/01/2017 02/28/2017 02/27/2017  Glucose 65 - 99 mg/dL 93 88 103(H)  BUN 6 - 20 mg/dL 35(H) 31(H) 33(H)  Creatinine 0.61 - 1.24 mg/dL 0.90 0.94 1.00  Sodium 135 - 145 mmol/L 134(L) 136 137  Potassium 3.5 - 5.1 mmol/L 3.6 3.6 5.0  Chloride 101 - 111 mmol/L 97(L) 97(L) 99(L)  CO2 22 - 32 mmol/L 31 32 32  Calcium 8.9 - 10.3 mg/dL 7.9(L) 8.4(L) 8.1(L)  Total Protein 6.5 - 8.1 g/dL - 6.5 5.3(L)  Total Bilirubin 0.3 - 1.2 mg/dL - 0.5 0.6  Alkaline Phos 38 - 126 U/L - 222(H) 202(H)  AST 15 - 41 U/L - 25 21  ALT 17 - 63 U/L - 16(L) 14(L)   Component     Latest Ref Rng & Units 02/24/2017 02/26/2017  Total Protein, Urine-UPE24     Not Estab. mg/dL 163.9   Total Protein, Urine-Ur/day     30 - 150 mg/24 hr 6,228 (H)   ALBUMIN, U     % 68.5   ALPHA 1 URINE     % 8.5   Alpha 2, Urine     % 8.2   % BETA, Urine     % 7.7   GAMMA GLOBULIN URINE     % 7.0   M-SPIKE %, Urine     Not Observed % 1.9 (H)   M-Spike, mg/24 hr     Not Observed mg/24 hr 118 (H)   Immunofixation Result, Urine      Comment   NOTE:      Comment   Total Volume      3,800   Total Protein ELP     6.0 - 8.5 g/dL 4.9 (L)   Albumin ELP     2.9 - 4.4 g/dL 1.9 (L)   Alpha-1-Globulin     0.0 - 0.4 g/dL 0.2   Alpha-2-Globulin     0.4 - 1.0 g/dL 0.7   Beta Globulin     0.7 - 1.3 g/dL 0.6 (L)   Gamma Globulin     0.4 - 1.8 g/dL 1.4   M-SPIKE, %     Not Observed g/dL 1.1 (H)   SPE Interp.       Comment   Comment      Comment   Globulin, Total     2.2 - 3.9 g/dL 3.0   A/G Ratio     0.7 - 1.7 0.6 (L)   Creatinine, Urine     mg/dL 15.28 111.00  Total Protein, Urine     mg/dL 94 590  Protein Creatinine Ratio     0.00 - 0.15 mg/mgCre 6.15 (H) 5.32 (H)  Myeloperoxidase Abs  0.0 - 9.0 U/mL <9.0   ANCA Proteinase 3     0.0 - 3.5 U/mL <3.5   ANA Ab, IFA      Negative   C3 Complement     82 - 167 mg/dL 111   Complement C4, Body Fluid     14 - 44 mg/dL 14   Compl, Total (CH50)     >41 U/mL >60   ds DNA Ab     0 - 9 IU/mL <1   ASO     0.0 - 200.0 IU/mL <20.0   GBM Ab     0 - 20 units 4   HIV Screen 4th Generation wRfx     Non Reactive Non Reactive                                 *[place]*                   *[facility]*                         [address], [place] [address]                            [phone]  ------------------------------------------------------------------- Transthoracic Echocardiography  Patient:    [name], [name] MR #:       [id number] Study Date: 02/22/2017 Gender:     M Age:        67 Height:     180.3 cm Weight:     73 kg BSA:        1.91 m^2 Pt. Status: Room:       [place], [name], [place]  Gillis Santa [id number]  Esau Grew [id number]  PERFORMING   [facility], Inpatient  SONOGRAPHER  Jannett Celestine, RDCS  cc:  ------------------------------------------------------------------- LV EF: 60% -   65%  ------------------------------------------------------------------- Indications:      CHF - 428.0.  ------------------------------------------------------------------- History:   PMH:  Pulmonary Hypertension  Atrial fibrillation.  Congestive heart failure.  Chronic obstructive pulmonary disease.  Risk factors:  Former tobacco use.     ------------------------------------------------------------------- Study Conclusions  - Left ventricle: The cavity size was normal. There was moderate   concentric hypertrophy. Systolic function was normal. The   estimated ejection fraction was in the range of 60% to 65%. Wall   motion was normal; there were no regional wall motion   abnormalities. The study was not technically sufficient to allow   evaluation of LV diastolic dysfunction due to atrial   fibrillation. - Aortic valve: Trileaflet; normal thickness, mildly calcified   leaflets. There was trivial regurgitation. - Mitral valve: Calcified annulus. There was mild regurgitation. - Left atrium: The atrium was mildly dilated. - Right ventricle: The cavity size was moderately dilated. Wall   thickness was normal. - Right atrium: The atrium was massively dilated. - Tricuspid valve: There was moderate regurgitation. - Pulmonic valve: There was trivial regurgitation. - Pulmonary arteries: PA peak pressure: 51 mm Hg (S).  RADIOGRAPHIC STUDIES: I have personally reviewed the radiological images as listed and  agreed with the findings in the report. Dg Chest 1 View  Result Date: 02/22/2017 CLINICAL DATA:  Status post LEFT thoracentesis EXAM: CHEST 1 VIEW COMPARISON:  Chest radiograph 02/21/2017 FINDINGS: Interval reduction of LEFT pleural fluid volume. Small effusion remains on the LEFT. No LEFT pneumothorax. Small loculated RIGHT pleural effusion additionally. IMPRESSION: No pneumothorax following LEFT thoracentesis. Reduction in pleural fluid on the LEFT. Electronically Signed   By: Suzy Bouchard M.D.   On: 02/22/2017 16:10   Dg Chest 2 View  Result Date: 02/25/2017 CLINICAL DATA:  Shortness of breath, back pain. EXAM: CHEST  2 VIEW COMPARISON:  02/22/2017 FINDINGS: There is small to moderate right pleural effusion. Cardiomegaly with diffuse interstitial and likely early alveolar opacities, right greater than left. Favor edema/  CHF. Slight interval worsening since prior study. No acute bony abnormality. IMPRESSION: Worsening small to moderate right pleural effusion and diffuse interstitial and early alveolar opacities, likely edema/CHF. Electronically Signed   By: Rolm Baptise M.D.   On: 02/25/2017 09:50   Dg Chest 2 View  Result Date: 02/21/2017 CLINICAL DATA:  Left upper extremity swelling for 3 months. Worsening fatigue. Dyspnea. EXAM: CHEST  2 VIEW COMPARISON:  12/11/2016 FINDINGS: Moderate pleural effusions bilaterally, enlarged. Diffuse interstitial thickening or fluid, worsened. Moderate cardiomegaly, unchanged. Basilar lung opacity adjacent to the pleural fluid collections, left greater than right, atelectasis versus infectious infiltrate. IMPRESSION: Enlarging bilateral effusions, left greater than right. Diffuse interstitial fluid or thickening, worsened. Electronically Signed   By: Andreas Newport M.D.   On: 02/21/2017 18:07   Ct Angio Chest Pe W Or Wo Contrast  Result Date: 02/22/2017 CLINICAL DATA:  Shortness of breath. Bilateral pleural effusions. History of prostate carcinoma. EXAM: CT ANGIOGRAPHY CHEST WITH CONTRAST TECHNIQUE: Multidetector CT imaging of the chest was performed using the standard protocol during bolus administration of intravenous contrast. Multiplanar CT image reconstructions and MIPs were obtained to evaluate the vascular anatomy. CONTRAST:  100 mL Isovue 370 IV COMPARISON:  CT of the chest without contrast on 11/18/2016 and PET scan on 12/12/2016 FINDINGS: Cardiovascular: The pulmonary arteries are densely opacified with contrast. There is no evidence of pulmonary embolism. No pericardial fluid. There is some reflux of contrast into the IVC and hepatic veins consistent with right heart failure. Mediastinum/Nodes: No enlarged mediastinal, hilar, or axillary lymph nodes. Thyroid gland, trachea, and esophagus demonstrate no significant findings. Lungs/Pleura: Bilateral pleural effusions again  noted. A smaller right pleural effusion appears stable compared to prior imaging. There is some enlargement of left pleural fluid volume which is now moderate. Lungs show evidence of stable emphysema and probable congestive heart failure. No focal airspace consolidation or pneumothorax identified. Upper Abdomen: No acute abnormality. Musculoskeletal: No chest wall abnormality. No acute or significant osseous findings. Review of the MIP images confirms the above findings. IMPRESSION: 1. No evidence of acute pulmonary embolism. 2. Evidence of congestive heart failure and right heart failure. Chronic bilateral pleural effusions again noted with some enlargement of the left pleural effusion compared to prior studies. There is a smaller right pleural effusion present which appears relatively stable in volume. 3. Stable emphysematous lung disease. Emphysema (ICD10-J43.9). Electronically Signed   By: Aletta Edouard M.D.   On: 02/22/2017 14:09   Dg Chest Bilateral Decubitus  Result Date: 02/25/2017 CLINICAL DATA:  Shortness of breath. EXAM: CHEST - BILATERAL DECUBITUS VIEW COMPARISON:  Chest x-ray today. FINDINGS: Moderate right pleural effusion which appears to remain present laterally on the left decubitus view suggesting loculation. No layering left  pleural effusion. No pneumothorax. IMPRESSION: Moderate right pleural effusion, likely at least partially loculated laterally. Electronically Signed   By: Rolm Baptise M.D.   On: 02/25/2017 09:52   US Biopsy (kidney)  Result Date: 02/27/2017 INDICATION: 71 year old male with proteinuria. EXAM: ULTRASOUND GUIDED RENAL BIOPSY COMPARISON:  None. MEDICATIONS: Fentanyl 100 mcg IV; Versed 2 mg IV ANESTHESIA/SEDATION: Total Moderate Sedation time 10 minutes COMPLICATIONS: None immediate PROCEDURE: Informed written consent was obtained from the patient after a discussion of the risks, benefits and alternatives to treatment. The patient understands and consents the  procedure. A timeout was performed prior to the initiation of the procedure. Ultrasound scanning was performed of the bilateral flanks. The inferior pole of the right kidney was selected for biopsy due to location and sonographic window. The procedure was planned. The operative site was prepped and draped in the usual sterile fashion. The overlying soft tissues were anesthetized with 1% lidocaine with epinephrine. A 17 gauge core needle biopsy device was advanced into the inferior cortex of the right kidney and 2 core biopsies were obtained under direct ultrasound guidance. Images were saved for documentation purposes. The biopsy device was removed and hemostasis was obtained with manual compression. Post procedural scanning was negative for significant post procedural hemorrhage or additional complication. A dressing was placed. The patient tolerated the procedure well without immediate post procedural complication. IMPRESSION: Technically successful ultrasound guided right renal biopsy. Electronically Signed   By: Jacqulynn Cadet M.D.   On: 02/27/2017 14:33   US Thoracentesis Asp Pleural Space W/img Guide  Result Date: 02/23/2017 INDICATION: Hit with history of CHF and anasarca, now with pleural effusion. Request is made for diagnostic and therapeutic thoracentesis on the left. EXAM: ULTRASOUND GUIDED DIAGNOSTIC AND THERAPEUTIC LEFT THORACENTESIS MEDICATIONS: 10 mL 1% lidocaine COMPLICATIONS: None immediate. PROCEDURE: An ultrasound guided thoracentesis was thoroughly discussed with the patient and questions answered. The benefits, risks, alternatives and complications were also discussed. The patient understands and wishes to proceed with the procedure. Written consent was obtained. Ultrasound was performed to localize and mark an adequate pocket of fluid in the left chest. The area was then prepped and draped in the normal sterile fashion. 1% Lidocaine was used for local anesthesia. Under ultrasound  guidance a Safe-T-Centesis catheter was introduced. Thoracentesis was performed. The catheter was removed and a dressing applied. FINDINGS: A total of approximately 1.2 liters of pale yellow fluid was removed. Samples were sent to the laboratory as requested by the clinical team. Limited ultrasound of the chest on the right shows loculated pleural fluid. Note patient did undergo a right VATS procedure on 05/20/2016. IMPRESSION: Successful ultrasound guided diagnostic and therapeutic left thoracentesis yielding 1.2 liters of pleural fluid. Electronically Signed   By: Aletta Edouard M.D.   On: 02/22/2017 16:16    ASSESSMENT & PLAN:   71 yo caucasian male with multiple medical co-morbidities with   1) Newly Diagnosed AL Amyloidosis based on prelim results of renal biopsy . Final pathology pending. Unlikely to be associated with Myeloma but will need to be ruled out. Patient has anemia but no overt hypercalcemia or focal bone pains. 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 -I spent a significant amount of time with the patient and his wife at decide explaining the prelim diagnosis of AL Amyloidosis, the significance of the diagnosis, natural history, prognosis , workup and treatment options. -will need continued diuresis to  achieve euvolemic status -cardiac MRI inpatient vs outpatient. -myeloma panel, serum free light chains -24h UPEP/TP -Whole body skeletal survey -Bone marrow biopsy inpatient vs outpatient. (can be done as outpatient if patient is ready to be discharged) -would likely need to by treated with CyBorD regimen as tolerated with appropriate prophylactic medications. -outpatient f/u with his oncologist Dr Alen Blew in 5-7 days post discharge. -I have messaged Dr Drue Flirt help plan post hospitalization f/u.  Plz call with additional questions if any. Case was discussed  with Dr Marval Regal.  All of the patients questions were answered with apparent satisfaction. The patient knows to call the clinic with any problems, questions or concerns.  I spent 60 minutes counseling the patient face to face. The total time spent in the appointment was 80 minutes and more than 50% was on counseling and direct patient cares.    Sullivan Lone MD [place] AAHIVMS [facility] Hematology/Oncology Physician [facility]  (Office):       [phone] (Work cell):  [phone] (Fax):           [phone]  03/01/2017 11:32 AM

## 2017-03-01 NOTE — Progress Notes (Signed)
Patient ID: [name], male   DOB: 02-09-1946, 71 y.o.   MRN: [id number]    Primary Cardiologist: Radford Pax HF: (new) [place]  Subjective:    Pt complains of fatigue; mild dyspnea; no chest pain Patient was admitted with fatigue and edema. Difficult to diurese due to low BP.  Marked hypoalbuminemia with poor po intake x months.  Urine protein/creatinine ratio 6.67 from 10/14, suggestive of nephrotic-range proteinuria.   Had left thoracentesis on 10/13 with 1.2 L out, looks like transudate but grew coag neg Staph (suspect contaminant).  Now off vancomycin.  Has history of empyema on right.   S/P Successful DC-CV 10/17. Electric burn from procedure.   Nephrotic range proteinuria, 6.2 g/24 hours on collection.  He had renal biopsy on 10/18, preliminary bx results c/w amyloid  Echo: EF 60-65% with moderate LVH, mild MR, moderately dilated RV, PASP 51 mmHg, moderate TR.   LUE [place]: No LUE DVT  Objective:   Weight Range: 69 kg (152 lb 3.2 oz) Body mass index is 21.23 kg/m.   Vital Signs:   Temp:  [97.8 F (36.6 C)-98.3 F (36.8 C)] 98 F (36.7 C) (10/20 0622) Pulse Rate:  [76-83] 83 (10/20 0900) Resp:  [16-18] 18 (10/20 0622) BP: (90-116)/(49-68) 90/49 (10/20 0900) SpO2:  [93 %-98 %] 98 % (10/20 0622) Weight:  [69 kg (152 lb 3.2 oz)] 69 kg (152 lb 3.2 oz) (10/20 0622) Last BM Date: 02/26/17  Weight change: Filed Weights   02/27/17 0354 02/28/17 0524 03/01/17 0622  Weight: 69.8 kg (153 lb 14.4 oz) 68.4 kg (150 lb 12.8 oz) 69 kg (152 lb 3.2 oz)    Intake/Output:   Intake/Output Summary (Last 24 hours) at 03/01/17 1134 Last data filed at 03/01/17 1014  Gross per 24 hour  Intake              360 ml  Output             1250 ml  Net             -890 ml      Physical Exam    General: Chronically ill appearing HEENT: normal Neck: supple Lungs: Diminished BS bases CV: RRR Abdomen: NT/mildly distended; no masses Neurologic: grossly intact Extremities: trace  edema    Telemetry  NSR with NSVT (personally reviewed)  Labs    CBC  Recent Labs  02/28/17 1115 03/01/17 0442  WBC 8.2 8.9  NEUTROABS 5.3 4.5  HGB 11.8* 10.2*  HCT 36.2* 32.2*  MCV 93.5 93.1  PLT 221 428   Basic Metabolic Panel  Recent Labs  02/27/17 0216 02/28/17 1115 03/01/17 0442  NA 137 136 134*  K 5.0 3.6 3.6  CL 99* 97* 97*  CO2 32 32 31  GLUCOSE 103* 88 93  BUN 33* 31* 35*  CREATININE 1.00 0.94 0.90  CALCIUM 8.1* 8.4* 7.9*  MG 2.1 2.0  --    Liver Function Tests  Recent Labs  02/27/17 0216 02/28/17 1115  AST 21 25  ALT 14* 16*  ALKPHOS 202* 222*  BILITOT 0.6 0.5  PROT 5.3* 6.5  ALBUMIN 1.7* 1.9*   BNP: BNP (last 3 results)  Recent Labs  11/19/16 0909 11/22/16 0318 02/21/17 1637  BNP 433.1* 678.9* 626.5*     Medications:     Scheduled Medications: . feeding supplement  1 Container Oral TID BM  . feeding supplement (PRO-STAT SUGAR FREE 64)  30 mL Oral TID BM  . folic acid  1 mg  Oral Daily  . furosemide  40 mg Oral Daily  . gabapentin  200 mg Oral QHS  . lidocaine  1 patch Transdermal Q24H  . midodrine  5 mg Oral TID WC  . multivitamin with minerals  1 tablet Oral Daily  . rivaroxaban  20 mg Oral Q supper  . simethicone  80 mg Oral Once  . sodium chloride flush  3 mL Intravenous Q12H  . sodium chloride flush  3 mL Intravenous Q12H  . thiamine  100 mg Oral Daily    Infusions: . sodium chloride    . sodium chloride      PRN Medications: acetaminophen **OR** acetaminophen, HYDROcodone-acetaminophen, hydrocortisone cream, ondansetron **OR** ondansetron (ZOFRAN) IV, sodium chloride flush, sodium chloride flush, traMADol    Patient Profile   71 yo with persistent atrial fibrillation, chronic diastolic CHF with anasarca, hypoalbuminemia was admitted with CHF and hypotension. Now with diagnosis of amyloid  Assessment/Plan   1. Acute on chronic diastolic CHF: Patient's volume status appears to be stable today. Continue  present dose of Lasix. Follow renal function. There appears to be a component of decreased oncotic pressure related to hypoalbuminemia. 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  - Continue midodrine.  2. Hypoalbuminemia: Likely combination of nephrotic syndrome and decreased by mouth intake. -Renal biopsy consistent with amyloid. Oncology has been consulted with recommendations pending. 3. Atrial fibrillation: NSR after DCCV.  - continue xarelto. 4. Pleural effusions: Bilateral, partially loculated on right.  Had left thoracentesis, fluid appeared to be a transudate but growing GPCs => coag negative Staph, likely contaminant.   5. Nephrotic syndrome: Associate with M-spike on SPEP and UPEP.  Amyloidosis on preliminary results of renal bx. Moderate LVH on echo; likely with cardiac involvement.  6. Electric Burn after DC-CV on chest and back. Continue hydrocortisone to chest and apply Allevyn to back.  7. Amyloid-oncology consult pending.  .Length of Stay: Curry, MD  03/01/2017, 11:34 AM

## 2017-03-02 ENCOUNTER — Inpatient Hospital Stay (HOSPITAL_COMMUNITY): Payer: Medicare Other

## 2017-03-02 ENCOUNTER — Other Ambulatory Visit (HOSPITAL_COMMUNITY): Payer: Self-pay | Admitting: Radiology

## 2017-03-02 ENCOUNTER — Other Ambulatory Visit: Payer: Self-pay | Admitting: Radiology

## 2017-03-02 DIAGNOSIS — R600 Localized edema: Secondary | ICD-10-CM

## 2017-03-02 DIAGNOSIS — R801 Persistent proteinuria, unspecified: Secondary | ICD-10-CM

## 2017-03-02 DIAGNOSIS — J90 Pleural effusion, not elsewhere classified: Secondary | ICD-10-CM

## 2017-03-02 DIAGNOSIS — E8581 Light chain (AL) amyloidosis: Secondary | ICD-10-CM

## 2017-03-02 DIAGNOSIS — R590 Localized enlarged lymph nodes: Secondary | ICD-10-CM

## 2017-03-02 LAB — CBC WITH DIFFERENTIAL/PLATELET
BASOS PCT: 1 %
Basophils Absolute: 0 10*3/uL (ref 0.0–0.1)
EOS ABS: 0.2 10*3/uL (ref 0.0–0.7)
EOS PCT: 3 %
HCT: 32.1 % — ABNORMAL LOW (ref 39.0–52.0)
Hemoglobin: 10.5 g/dL — ABNORMAL LOW (ref 13.0–17.0)
LYMPHS ABS: 2.7 10*3/uL (ref 0.7–4.0)
Lymphocytes Relative: 34 %
MCH: 30.7 pg (ref 26.0–34.0)
MCHC: 32.7 g/dL (ref 30.0–36.0)
MCV: 93.9 fL (ref 78.0–100.0)
MONO ABS: 1.1 10*3/uL — AB (ref 0.1–1.0)
MONOS PCT: 14 %
Neutro Abs: 3.9 10*3/uL (ref 1.7–7.7)
Neutrophils Relative %: 48 %
Platelets: 229 10*3/uL (ref 150–400)
RBC: 3.42 MIL/uL — ABNORMAL LOW (ref 4.22–5.81)
RDW: 16 % — AB (ref 11.5–15.5)
WBC: 8 10*3/uL (ref 4.0–10.5)

## 2017-03-02 LAB — BASIC METABOLIC PANEL
Anion gap: 5 (ref 5–15)
BUN: 33 mg/dL — AB (ref 6–20)
CALCIUM: 8.1 mg/dL — AB (ref 8.9–10.3)
CO2: 33 mmol/L — AB (ref 22–32)
CREATININE: 0.97 mg/dL (ref 0.61–1.24)
Chloride: 99 mmol/L — ABNORMAL LOW (ref 101–111)
GFR calc non Af Amer: >60 mL/min (ref >60)
GLUCOSE: 91 mg/dL (ref 65–99)
Potassium: 3.6 mmol/L (ref 3.5–5.1)
Sodium: 137 mmol/L (ref 135–145)

## 2017-03-02 LAB — BETA 2 MICROGLOBULIN, SERUM: Beta-2 Microglobulin: 4 mg/L — ABNORMAL HIGH (ref 0.6–2.4)

## 2017-03-02 MED ORDER — BOOST / RESOURCE BREEZE PO LIQD: 1.0000 | Freq: Three times a day (TID) | ORAL | 0 refills | Status: DC

## 2017-03-02 MED ORDER — HYDROCORTISONE 1 % EX CREA: 1.0000 "application " | TOPICAL_CREAM | Freq: Four times a day (QID) | CUTANEOUS | 0 refills | Status: DC | PRN

## 2017-03-02 NOTE — Discharge Summary (Signed)
Discharge Summary  [name] UYQ:[id number] DOB: 07/22/1945  PCP: Christopher Buffy, NP  Admit date: 02/21/2017 Discharge date: 03/02/2017  Time spent: 25 minutes  Recommendations for Outpatient Follow-up:  1. Follow-up with primary care provider within a week 2. Follow-up with heme oncology within a week 3. Bone marrow biopsy as outpatient as recommended by heme oncology, patient will make the appointment 4. Follow-up with nephrology within a week 5. Keep appointment with your cardiologist  Discharge Diagnoses:  Active Hospital Problems   Diagnosis Date Noted  . Light chain (AL) amyloidosis (HCC)   . Hypoxia   . Nephrotic syndrome   . Proteinuria   . Dyspnea   . Left arm swelling   . Malnutrition of moderate degree 02/23/2017  . Acute on chronic diastolic CHF (congestive heart failure) ([place])   . Elevated brain natriuretic peptide (BNP) level   . Hypotension 02/21/2017  . Fluid overload 02/21/2017  . Hypoalbuminemia 11/19/2016  . Acute respiratory failure with hypoxia ([place]) 05/21/2016  . Lung mass 05/21/2016  . 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 Tobacco abuse 05/14/2016    Resolved Hospital Problems   Diagnosis Date Noted Date Resolved  No resolved problems to display.    Discharge Condition: Stable  Diet recommendation: Increase p.o. protein calorie intake  Vitals:   03/02/17 0455 03/02/17 1224  BP: (!) 95/56 115/74  Pulse: 69 78  Resp: 18 18  Temp: 97.9 F (36.6 C) 97.8 F (36.6 C)  SpO2: 98% 97%    History of present illness:  [name] is a 71 year old male with past medical history significant for paroxysmal A. fib on Xarelto,diastolic CHF with EF of 63-87% (05/14/16),COPD on 2L O2 at night,pulmonary hypertension status post right VATS in May 20, 2016 due toempyema, history of lung mass,pleural effusion who presented on 10/12/18with complaints of fatigue and generalized swelling. Patient was admitted for acute hypoxic respiratory  failure, fluid overload, and chronic proteinuriawith hypoalbuminemia.  Renal biopsy done on 02/28/2015 preliminary results revealed AL amyloidosis.  Heme oncology was consulted.  During this admission patient was followed by cardiology, nephrology, and hematology.  Patient ambulated with physical therapy and received recommendations from dietitian.  On the day of discharge the patient was hemodynamically stable. Patient was advised to follow-up with his primary care provider, heme oncologist, cardiologist, and nephrologist.  Patient was also advised to take his medications as prescribed, to continue physical therapy and increase p.o. protein calorie intake.  Hospital Course:  Active Problems:   COPD mixed type ([place])   Tobacco abuse   Acute respiratory failure with hypoxia (HCC)   Lung mass   Hypoalbuminemia   Hypotension   Fluid overload   Elevated brain natriuretic peptide (BNP) level   Malnutrition of moderate degree   Acute on chronic diastolic CHF (congestive heart failure) (HCC)   Dyspnea   Left arm swelling   Hypoxia   Nephrotic syndrome   Proteinuria   Light chain (AL) amyloidosis (HCC)  Newly diagnosed AL amyloidosis per preliminary results from renal biopsy done on 02/27/2017 -Heme oncology consulted and recommended bone marrow biopsy which will be done as outpatient  Acute on chronic hypoxic respiratory failure -Improving -Most likely multifactorial secondary to fluid overload versus COPD exacerbationversus chronic lung mass  Hypoalbuminemia 2/2 chronic proteinuria - Proteinuria reported since 2015 - Nephrology following; - Renal biopsy done 02/27/17 revealed AL amyloidosis  Neuropathic pain -Follow-up with primary care provider - avoid opioids   Moderate calorie protein malnutrition -Improving with oral supplement -  Albumin 1.9 from 1.7 - Continue oral supplement - Patient encouraged to increase po protein calorie intake  Hypotension -BP is  stable -Continueto maintain MAPgreater than 65 -Continue midodrine  Minor skin burn -From electrocardioversion -continue hydrocortisone cream  Anemia -Hg is stable -No reported obvious bleed  COPD mixed type (HCC) -Avoid tobacco products -2 L O2 supplementation at night  Lung mass -Keep appointment with hemoncology  Fluid overload -Improving -Continue Lasix  Acute on chronic diastolic CHF (congestive heart failure) ([place]) -2D Echo EF 60-65% (02/22/17)  Anasarca -Resolved   Procedures:  Renal biopsy 02/27/2017 done by interventional radiology  Consultations:  Cardiology, nephrology, and heme oncology  Discharge Exam: BP 115/74 (BP Location: Right Arm)   Pulse 78   Temp 97.8 F (36.6 C) (Oral)   Resp 18   Ht _0  (1.803 m)   Wt 69 kg (152 lb 3.2 oz) Comment: scale b  SpO2 97%   BMI 21.23 kg/m    General: 71 year old Caucasian male alert oriented x4 cachectic laying in bed in no apparent acute distress Cardiovascular: Regular rate and rhythm with no murmurs rubs or gallops Respiratory: Clear to auscultation with no wheezes or rhonchi appreciated.  Discharge Instructions You were cared for by a hospitalist during your hospital stay. If you have any questions about your discharge medications or the care you received while you were in the hospital after you are discharged, you can call the unit and asked to speak with the hospitalist on call if the hospitalist that took care of you is not available. Once you are discharged, your primary care physician will handle any further medical issues. Please note that NO REFILLS for any discharge medications will be authorized once you are discharged, as it is imperative that you return to your primary care physician (or establish a relationship with a primary care physician if you do not have one) for your aftercare needs so that they can reassess your need for medications and monitor your lab  values.   Allergies as of 03/02/2017      Reactions   Demerol [meperidine] Other (See Comments)   UNSPECIFIED REACTION  Causes system to shutdown. ?    Oxycodone Hcl Other (See Comments)   Pt states this medication 'wires him up' and makes pt hyper; pt does not want to take this ever again      Medication List    STOP taking these medications   acetaminophen 325 MG tablet Commonly known as:  TYLENOL     TAKE these medications   feeding supplement Liqd Take 1 Container by mouth 3 (three) times daily between meals.   furosemide 40 MG tablet Commonly known as:  LASIX Take 0.5 tablets (20 mg total) by mouth every other day.   hydrocortisone cream 1 % Apply 1 application topically 4 (four) times daily as needed for itching.   polyethylene glycol packet Commonly known as:  MIRALAX / GLYCOLAX Take 17 g by mouth daily as needed.   rivaroxaban 20 MG Tabs tablet Commonly known as:  XARELTO Take 1 tablet (20 mg total) by mouth daily with supper.      Allergies  Allergen Reactions  . Demerol [Meperidine] Other (See Comments)    UNSPECIFIED REACTION  Causes system to shutdown. ?   . Oxycodone Hcl Other (See Comments)    Pt states this medication 'wires him up' and makes pt hyper; pt does not want to take this ever again   [place], Baldo Ash  Lum, NP Follow up.   Specialty:  Internal Medicine Contact information: [address] [place] [address] [phone]            The results of significant diagnostics from this hospitalization (including imaging, microbiology, ancillary and laboratory) are listed below for reference.    Significant Diagnostic Studies: Dg Chest 1 View  Result Date: 02/22/2017 CLINICAL DATA:  Status post LEFT thoracentesis EXAM: CHEST 1 VIEW COMPARISON:  Chest radiograph 02/21/2017 FINDINGS: Interval reduction of LEFT pleural fluid volume. Small effusion remains on the LEFT. No LEFT pneumothorax. Small loculated RIGHT  pleural effusion additionally. IMPRESSION: No pneumothorax following LEFT thoracentesis. Reduction in pleural fluid on the LEFT. Electronically Signed   By: Suzy Bouchard M.D.   On: 02/22/2017 16:10   Dg Chest 2 View  Result Date: 02/25/2017 CLINICAL DATA:  Shortness of breath, back pain. EXAM: CHEST  2 VIEW COMPARISON:  02/22/2017 FINDINGS: There is small to moderate right pleural effusion. Cardiomegaly with diffuse interstitial and likely early alveolar opacities, right greater than left. Favor edema/ CHF. Slight interval worsening since prior study. No acute bony abnormality. IMPRESSION: Worsening small to moderate right pleural effusion and diffuse interstitial and early alveolar opacities, likely edema/CHF. Electronically Signed   By: Rolm Baptise M.D.   On: 02/25/2017 09:50   Dg Chest 2 View  Result Date: 02/21/2017 CLINICAL DATA:  Left upper extremity swelling for 3 months. Worsening fatigue. Dyspnea. EXAM: CHEST  2 VIEW COMPARISON:  12/11/2016 FINDINGS: Moderate pleural effusions bilaterally, enlarged. Diffuse interstitial thickening or fluid, worsened. Moderate cardiomegaly, unchanged. Basilar lung opacity adjacent to the pleural fluid collections, left greater than right, atelectasis versus infectious infiltrate. IMPRESSION: Enlarging bilateral effusions, left greater than right. Diffuse interstitial fluid or thickening, worsened. Electronically Signed   By: Andreas Newport M.D.   On: 02/21/2017 18:07   Ct Angio Chest Pe W Or Wo Contrast  Result Date: 02/22/2017 CLINICAL DATA:  Shortness of breath. Bilateral pleural effusions. History of prostate carcinoma. EXAM: CT ANGIOGRAPHY CHEST WITH CONTRAST TECHNIQUE: Multidetector CT imaging of the chest was performed using the standard protocol during bolus administration of intravenous contrast. Multiplanar CT image reconstructions and MIPs were obtained to evaluate the vascular anatomy. CONTRAST:  100 mL Isovue 370 IV COMPARISON:  CT of the  chest without contrast on 11/18/2016 and PET scan on 12/12/2016 FINDINGS: Cardiovascular: The pulmonary arteries are densely opacified with contrast. There is no evidence of pulmonary embolism. No pericardial fluid. There is some reflux of contrast into the IVC and hepatic veins consistent with right heart failure. Mediastinum/Nodes: No enlarged mediastinal, hilar, or axillary lymph nodes. Thyroid gland, trachea, and esophagus demonstrate no significant findings. Lungs/Pleura: Bilateral pleural effusions again noted. A smaller right pleural effusion appears stable compared to prior imaging. There is some enlargement of left pleural fluid volume which is now moderate. Lungs show evidence of stable emphysema and probable congestive heart failure. No focal airspace consolidation or pneumothorax identified. Upper Abdomen: No acute abnormality. Musculoskeletal: No chest wall abnormality. No acute or significant osseous findings. Review of the MIP images confirms the above findings. IMPRESSION: 1. No evidence of acute pulmonary embolism. 2. Evidence of congestive heart failure and right heart failure. Chronic bilateral pleural effusions again noted with some enlargement of the left pleural effusion compared to prior studies. There is a smaller right pleural effusion present which appears relatively stable in volume. 3. Stable emphysematous lung disease. Emphysema (ICD10-J43.9). Electronically Signed   By: Aletta Edouard M.D.   On: 02/22/2017  14:09   Dg Chest Bilateral Decubitus  Result Date: 02/25/2017 CLINICAL DATA:  Shortness of breath. EXAM: CHEST - BILATERAL DECUBITUS VIEW COMPARISON:  Chest x-ray today. FINDINGS: Moderate right pleural effusion which appears to remain present laterally on the left decubitus view suggesting loculation. No layering left pleural effusion. No pneumothorax. IMPRESSION: Moderate right pleural effusion, likely at least partially loculated laterally. Electronically Signed   By: Rolm Baptise M.D.   On: 02/25/2017 09:52   Dg Bone Survey Met  Result Date: 03/02/2017 CLINICAL DATA:  Plasma cell dyscrasia EXAM: METASTATIC BONE SURVEY COMPARISON:  PET-CT 12/12/2016, 10/31/2016 FINDINGS: Multiple x-rays of the axial and appendicular skeleton were performed. No aggressive lytic or sclerotic osseous lesion. Moderate right pleural effusion. Bilateral interstitial thickening. Stable cardiomegaly. No acute fracture or dislocation. Degenerative disc disease with mild disc height loss at C3-4, C4-5 and C5-6. S-shaped scoliosis of the thoracolumbar spine. Mild diffuse thoracolumbar spine spondylosis. Mild osteoarthritis of the left hip. Peripheral vascular atherosclerotic disease. IMPRESSION: 1. No aggressive lytic or sclerotic bone lesion of the axial and appendicular skeleton. Electronically Signed   By: Kathreen Devoid   On: 03/02/2017 12:47   US Biopsy (kidney)  Result Date: 02/27/2017 INDICATION: 71 year old male with proteinuria. EXAM: ULTRASOUND GUIDED RENAL BIOPSY COMPARISON:  None. MEDICATIONS: Fentanyl 100 mcg IV; Versed 2 mg IV ANESTHESIA/SEDATION: Total Moderate Sedation time 10 minutes COMPLICATIONS: None immediate PROCEDURE: Informed written consent was obtained from the patient after a discussion of the risks, benefits and alternatives to treatment. The patient understands and consents the procedure. A timeout was performed prior to the initiation of the procedure. Ultrasound scanning was performed of the bilateral flanks. The inferior pole of the right kidney was selected for biopsy due to location and sonographic window. The procedure was planned. The operative site was prepped and draped in the usual sterile fashion. The overlying soft tissues were anesthetized with 1% lidocaine with epinephrine. A 17 gauge core needle biopsy device was advanced into the inferior cortex of the right kidney and 2 core biopsies were obtained under direct ultrasound guidance. Images were saved for  documentation purposes. The biopsy device was removed and hemostasis was obtained with manual compression. Post procedural scanning was negative for significant post procedural hemorrhage or additional complication. A dressing was placed. The patient tolerated the procedure well without immediate post procedural complication. IMPRESSION: Technically successful ultrasound guided right renal biopsy. Electronically Signed   By: Jacqulynn Cadet M.D.   On: 02/27/2017 14:33   US Thoracentesis Asp Pleural Space W/img Guide  Result Date: 02/23/2017 INDICATION: Hit with history of CHF and anasarca, now with pleural effusion. Request is made for diagnostic and therapeutic thoracentesis on the left. EXAM: ULTRASOUND GUIDED DIAGNOSTIC AND THERAPEUTIC LEFT THORACENTESIS MEDICATIONS: 10 mL 1% lidocaine COMPLICATIONS: None immediate. PROCEDURE: An ultrasound guided thoracentesis was thoroughly discussed with the patient and questions answered. The benefits, risks, alternatives and complications were also discussed. The patient understands and wishes to proceed with the procedure. Written consent was obtained. Ultrasound was performed to localize and mark an adequate pocket of fluid in the left chest. The area was then prepped and draped in the normal sterile fashion. 1% Lidocaine was used for local anesthesia. Under ultrasound guidance a Safe-T-Centesis catheter was introduced. Thoracentesis was performed. The catheter was removed and a dressing applied. FINDINGS: A total of approximately 1.2 liters of pale yellow fluid was removed. Samples were sent to the laboratory as requested by the clinical team. Limited ultrasound of the chest on the  right shows loculated pleural fluid. Note patient did undergo a right VATS procedure on 05/20/2016. IMPRESSION: Successful ultrasound guided diagnostic and therapeutic left thoracentesis yielding 1.2 liters of pleural fluid. Electronically Signed   By: Aletta Edouard M.D.   On:  02/22/2017 16:16    Microbiology: Recent Results (from the past 240 hour(s))  Gram stain     Status: None   Collection Time: 02/22/17  3:36 PM  Result Value Ref Range Status   Specimen Description PLEURAL LEFT  Final   Special Requests NONE  Final   Gram Stain   Final    CYTOSPIN SMEAR WBC PRESENT,BOTH PMN AND MONONUCLEAR NO ORGANISMS SEEN    Report Status 02/23/2017 FINAL  Final  Culture, body fluid-bottle     Status: Abnormal   Collection Time: 02/22/17  3:36 PM  Result Value Ref Range Status   Specimen Description PLEURAL LEFT  Final   Special Requests NONE  Final   Gram Stain   Final    GRAM POSITIVE COCCI IN CLUSTERS AEROBIC BOTTLE ONLY CRITICAL RESULT CALLED TO, READ BACK BY AND VERIFIED WITH: L MCELWEE,RN AT 1820 02/23/17 BY L BENFIELD    Culture STAPHYLOCOCCUS SPECIES (COAGULASE NEGATIVE) (A)  Final   Report Status 02/25/2017 FINAL  Final   Organism ID, Bacteria STAPHYLOCOCCUS SPECIES (COAGULASE NEGATIVE)  Final      Susceptibility   Staphylococcus species (coagulase negative) - MIC*    CIPROFLOXACIN <=0.5 SENSITIVE Sensitive     ERYTHROMYCIN <=0.25 SENSITIVE Sensitive     GENTAMICIN <=0.5 SENSITIVE Sensitive     OXACILLIN <=0.25 SENSITIVE Sensitive     TETRACYCLINE <=1 SENSITIVE Sensitive     VANCOMYCIN <=0.5 SENSITIVE Sensitive     TRIMETH/SULFA <=10 SENSITIVE Sensitive     CLINDAMYCIN <=0.25 SENSITIVE Sensitive     RIFAMPIN <=0.5 SENSITIVE Sensitive     Inducible Clindamycin NEGATIVE Sensitive     * STAPHYLOCOCCUS SPECIES (COAGULASE NEGATIVE)     Labs: Basic Metabolic Panel:  Recent Labs Lab 02/24/17 0516 02/25/17 0504 02/26/17 0449 02/27/17 0216 02/28/17 1115 03/01/17 0442 03/02/17 0640  NA 137 135 135 137 136 134* 137  K 3.7 3.6 4.0 5.0 3.6 3.6 3.6  CL 100* 98* 96* 99* 97* 97* 99*  CO2 29 30 33* 32 32 31 33*  GLUCOSE 85 97 90 103* 88 93 91  BUN 26* 27* 32* 33* 31* 35* 33*  CREATININE 0.98 0.89 0.93 1.00 0.94 0.90 0.97  CALCIUM 8.2* 8.0* 8.0*  8.1* 8.4* 7.9* 8.1*  MG 1.7 1.7 1.7 2.1 2.0  --   --    Liver Function Tests:  Recent Labs Lab 02/26/17 0449 02/27/17 0216 02/28/17 1115  AST  --  21 25  ALT  --  14* 16*  ALKPHOS  --  202* 222*  BILITOT  --  0.6 0.5  PROT  --  5.3* 6.5  ALBUMIN 1.7* 1.7* 1.9*   No results for input(s): LIPASE, AMYLASE in the last 168 hours. No results for input(s): AMMONIA in the last 168 hours. CBC:  Recent Labs Lab 02/26/17 0449 02/27/17 0216 02/28/17 1115 03/01/17 0442 03/02/17 0640  WBC 9.2 8.9 8.2 8.9 8.0  NEUTROABS 4.7 4.9 5.3 4.5 3.9  HGB 9.9* 10.0* 11.8* 10.2* 10.5*  HCT 31.3* 31.4* 36.2* 32.2* 32.1*  MCV 92.6 93.2 93.5 93.1 93.9  PLT 239 210 221 204 229   Cardiac Enzymes: No results for input(s): CKTOTAL, CKMB, CKMBINDEX, TROPONINI in the last 168 hours. BNP: BNP (last 3 results)  Recent Labs  11/19/16 0909 11/22/16 0318 02/21/17 1637  BNP 433.1* 678.9* 626.5*    ProBNP (last 3 results) No results for input(s): PROBNP in the last 8760 hours.  CBG: No results for input(s): GLUCAP in the last 168 hours.     Signed:  Kayleen Memos, MD [facility] 03/02/2017, 5:33 PM

## 2017-03-02 NOTE — Progress Notes (Signed)
S:Reported back pain during the bone scan but this improved with vicodin when he returned to his room. Denies new concerns. Very happy with the conversation and education they had with Dr. Irene Limbo yesterday.   O:BP (!) 95/56 (BP Location: Right Arm)   Pulse 69   Temp 97.9 F (36.6 C) (Oral)   Resp 18   Ht 5' 11" (1.803 m)   Wt 152 lb 3.2 oz (69 kg) Comment: scale b  SpO2 98%   BMI 21.23 kg/m   Intake/Output Summary (Last 24 hours) at 03/02/17 1105 Last data filed at 03/02/17 1006  Gross per 24 hour  Intake              462 ml  Output             1190 ml  Net             -728 ml   Intake/Output: I/O last 3 completed shifts: In: 360 [P.O.:360] Out: 1590 [Urine:1590]  Intake/Output this shift:  Total I/O In: 222 [P.O.:222] Out: 200 [Urine:200] Weight change: 0 lb (0 kg) Gen: chronically ill appearing  CVS: RRR  Resp: lungs sound clear  Abd: soft, non tender Ext:1+ bilateral upper and lower extremity edema, compression stockings in place    Recent Labs Lab 02/24/17 0516 02/25/17 0504 02/26/17 0449 02/27/17 0216 02/28/17 1115 03/01/17 0442 03/02/17 0640  NA 137 135 135 137 136 134* 137  K 3.7 3.6 4.0 5.0 3.6 3.6 3.6  CL 100* 98* 96* 99* 97* 97* 99*  CO2 29 30 33* 32 32 31 33*  GLUCOSE 85 97 90 103* 88 93 91  BUN 26* 27* 32* 33* 31* 35* 33*  CREATININE 0.98 0.89 0.93 1.00 0.94 0.90 0.97  ALBUMIN  --   --  1.7* 1.7* 1.9*  --   --   CALCIUM 8.2* 8.0* 8.0* 8.1* 8.4* 7.9* 8.1*  AST  --   --   --  21 25  --   --   ALT  --   --   --  14* 16*  --   --    Liver Function Tests:  Recent Labs Lab 02/26/17 0449 02/27/17 0216 02/28/17 1115  AST  --  21 25  ALT  --  14* 16*  ALKPHOS  --  202* 222*  BILITOT  --  0.6 0.5  PROT  --  5.3* 6.5  ALBUMIN 1.7* 1.7* 1.9*   No results for input(s): LIPASE, AMYLASE in the last 168 hours. No results for input(s): AMMONIA in the last 168 hours. CBC:  Recent Labs Lab 02/26/17 0449 02/27/17 0216 02/28/17 1115 03/01/17 0442  03/02/17 0640  WBC 9.2 8.9 8.2 8.9 8.0  NEUTROABS 4.7 4.9 5.3 4.5 3.9  HGB 9.9* 10.0* 11.8* 10.2* 10.5*  HCT 31.3* 31.4* 36.2* 32.2* 32.1*  MCV 92.6 93.2 93.5 93.1 93.9  PLT 239 210 221 204 229   Cardiac Enzymes: No results for input(s): CKTOTAL, CKMB, CKMBINDEX, TROPONINI in the last 168 hours. CBG: No results for input(s): GLUCAP in the last 168 hours.  Iron Studies: No results for input(s): IRON, TIBC, TRANSFERRIN, FERRITIN in the last 72 hours. Studies/Results: No results found. . feeding supplement  1 Container Oral TID BM  . feeding supplement (PRO-STAT SUGAR FREE 64)  30 mL Oral TID BM  . folic acid  1 mg Oral Daily  . furosemide  40 mg Oral Daily  . gabapentin  200 mg Oral QHS  . lidocaine  1 patch Transdermal Q24H  . midodrine  5 mg Oral TID WC  . multivitamin with minerals  1 tablet Oral Daily  . rivaroxaban  20 mg Oral Q supper  . simethicone  80 mg Oral Once  . sodium chloride flush  3 mL Intravenous Q12H  . sodium chloride flush  3 mL Intravenous Q12H  . thiamine  100 mg Oral Daily    BMET    Component Value Date/Time   NA 137 03/02/2017 0640   NA 139 01/09/2017 1256   K 3.6 03/02/2017 0640   CL 99 (L) 03/02/2017 0640   CO2 33 (H) 03/02/2017 0640   GLUCOSE 91 03/02/2017 0640   BUN 33 (H) 03/02/2017 0640   BUN 26 01/09/2017 1256   CREATININE 0.97 03/02/2017 0640   CREATININE 0.84 06/12/2014 0922   CALCIUM 8.1 (L) 03/02/2017 0640   GFRNONAA >60 03/02/2017 0640   GFRAA >60 03/02/2017 0640   CBC    Component Value Date/Time   WBC 8.0 03/02/2017 0640   RBC 3.42 (L) 03/02/2017 0640   HGB 10.5 (L) 03/02/2017 0640   HGB 13.8 10/04/2016 1134   HCT 32.1 (L) 03/02/2017 0640   HCT 41.5 10/04/2016 1134   PLT 229 03/02/2017 0640   PLT 469 (H) 10/04/2016 1134   MCV 93.9 03/02/2017 0640   MCV 90 10/04/2016 1134   MCH 30.7 03/02/2017 0640   MCHC 32.7 03/02/2017 0640   RDW 16.0 (H) 03/02/2017 0640   RDW 14.9 10/04/2016 1134   LYMPHSABS 2.7 03/02/2017 0640    MONOABS 1.1 (H) 03/02/2017 0640   EOSABS 0.2 03/02/2017 0640   BASOSABS 0.0 03/02/2017 0640     Assessment/Plan: 1. 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 Dr. Irene Limbo evaluated him yesterday and has initiated further workup.  - He has outpatient follow up with hematology arranged. If he develops renal dysfunction, which is a possibility after bone marrow replacement, he should follow up with [place] [facility] Dr. Marval Regal.  2. Decreased urine output, resolved 3.  Acute on chronic congestive heart failure- received IV albumin, midodrine, and lasix, continue po lasix and compression stockings outpatient.   4. Pleural effusion, will need continued monitoring at outpatient follow up  6. persistent afib- NSR today, on xarelto  7.Chronic Hypotension -stable, still asymptomatic   I have seen and examined this patient and agree with plan and assessment in the above note with renal recommendations/intervention highlighted.  Appreciate assistance with Heme/Onc.  No renal issues at this point and can follow up as needed with our office.  Will sign off as patient is likely to be discharged later today.  He does not need to stay for 24 hour upep as one has already been completed prior to biopsy. Governor Rooks Dorian Duval,MD 03/02/2017 1:58 PM   Donetta Potts, MD [facility] [place] [phone]

## 2017-03-02 NOTE — Progress Notes (Signed)
Pt has orders to be discharged. Discharge instructions given and pt has no additional questions at this time. Medication regimen reviewed and pt educated. Pt verbalized understanding and has no additional questions. Telemetry box removed. IV removed and site in good condition. Pt upset that he will not have pain med for home use. MD notified. Pt stable and waiting for transportation.

## 2017-03-02 NOTE — Progress Notes (Signed)
Patient ID: [name], male   DOB: Jun 15, 1945, 71 y.o.   MRN: [id number]    Primary Cardiologist: Radford Pax HF: (new) [place]  Subjective:    Mild dyspnea this AM; no chest pain.  Patient was admitted with fatigue and edema. Difficult to diurese due to low BP.  Marked hypoalbuminemia with poor po intake x months.  Urine protein/creatinine ratio 6.67 from 10/14, suggestive of nephrotic-range proteinuria.   Had left thoracentesis on 10/13 with 1.2 L out, looks like transudate but grew coag neg Staph (suspect contaminant).  Now off vancomycin.  Has history of empyema on right.   S/P Successful DC-CV 10/17. Electric burn from procedure.   Nephrotic range proteinuria, 6.2 g/24 hours on collection.  He had renal biopsy on 10/18, preliminary bx results c/w amyloid  Echo: EF 60-65% with moderate LVH, mild MR, moderately dilated RV, PASP 51 mmHg, moderate TR.   LUE [place]: No LUE DVT  Objective:   Weight Range: 69 kg (152 lb 3.2 oz) Body mass index is 21.23 kg/m.   Vital Signs:   Temp:  [97.9 F (36.6 C)-98.5 F (36.9 C)] 97.9 F (36.6 C) (10/21 0455) Pulse Rate:  [69-86] 69 (10/21 0455) Resp:  [18] 18 (10/21 0455) BP: (95-97)/(47-56) 95/56 (10/21 0455) SpO2:  [98 %] 98 % (10/21 0455) Weight:  [69 kg (152 lb 3.2 oz)] 69 kg (152 lb 3.2 oz) (10/21 0455) Last BM Date: 03/01/17  Weight change: Filed Weights   02/28/17 0524 03/01/17 0622 03/02/17 0455  Weight: 68.4 kg (150 lb 12.8 oz) 69 kg (152 lb 3.2 oz) 69 kg (152 lb 3.2 oz)    Intake/Output:   Intake/Output Summary (Last 24 hours) at 03/02/17 1031 Last data filed at 03/02/17 1006  Gross per 24 hour  Intake              462 ml  Output             1190 ml  Net             -728 ml      Physical Exam    General: WD Chronically ill appearing HEENT: normal, normal eyelids Neck: supple. JVP 10 cm Lungs: Diminished BS bases; no wheeze CV: RRR, no murmur Abdomen: NT/ND Neurologic: grossly intact; no focal  findings Extremities: Trace edema    Telemetry  Sinus bradycardia with HR in 30s at times (personally reviewed)  Labs    CBC  Recent Labs  03/01/17 0442 03/02/17 0640  WBC 8.9 8.0  NEUTROABS 4.5 3.9  HGB 10.2* 10.5*  HCT 32.2* 32.1*  MCV 93.1 93.9  PLT 204 267   Basic Metabolic Panel  Recent Labs  02/28/17 1115 03/01/17 0442 03/02/17 0640  NA 136 134* 137  K 3.6 3.6 3.6  CL 97* 97* 99*  CO2 32 31 33*  GLUCOSE 88 93 91  BUN 31* 35* 33*  CREATININE 0.94 0.90 0.97  CALCIUM 8.4* 7.9* 8.1*  MG 2.0  --   --    Liver Function Tests  Recent Labs  02/28/17 1115  AST 25  ALT 16*  ALKPHOS 222*  BILITOT 0.5  PROT 6.5  ALBUMIN 1.9*   BNP: BNP (last 3 results)  Recent Labs  11/19/16 0909 11/22/16 0318 02/21/17 1637  BNP 433.1* 678.9* 626.5*     Medications:     Scheduled Medications: . feeding supplement  1 Container Oral TID BM  . feeding supplement (PRO-STAT SUGAR FREE 64)  30 mL Oral TID BM  .  folic acid  1 mg Oral Daily  . furosemide  40 mg Oral Daily  . gabapentin  200 mg Oral QHS  . lidocaine  1 patch Transdermal Q24H  . midodrine  5 mg Oral TID WC  . multivitamin with minerals  1 tablet Oral Daily  . rivaroxaban  20 mg Oral Q supper  . simethicone  80 mg Oral Once  . sodium chloride flush  3 mL Intravenous Q12H  . sodium chloride flush  3 mL Intravenous Q12H  . thiamine  100 mg Oral Daily    Infusions: . sodium chloride    . sodium chloride      PRN Medications: acetaminophen **OR** acetaminophen, HYDROcodone-acetaminophen, hydrocortisone cream, ondansetron **OR** ondansetron (ZOFRAN) IV, sodium chloride flush, sodium chloride flush, traMADol    Patient Profile   71 yo with persistent atrial fibrillation, chronic diastolic CHF with anasarca, hypoalbuminemia was admitted with CHF and hypotension. Now with diagnosis of amyloid  Assessment/Plan   1. Acute on chronic diastolic CHF: Patient euvolemic. Continue present dose of  Lasix. Follow renal function as outpt. There appears to be a component of decreased oncotic pressure related to hypoalbuminemia. 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  - Continue midodrine.  2. Hypoalbuminemia: Likely combination of nephrotic syndrome and decreased oral intake. -Renal biopsy consistent with amyloid. Oncology now following. 3. Atrial fibrillation: Sinus bradycardia after DCCV.  - continue xarelto. 4. Pleural effusions: Bilateral, partially loculated on right.  Had left thoracentesis, fluid appeared to be a transudate but growing GPCs => coag negative Staph, likely contaminant.   5. Nephrotic syndrome: Associate with M-spike on SPEP and UPEP.  Amyloidosis on preliminary results of renal bx. Moderate LVH on echo; likely with cardiac involvement. Can arrange cardiac MRI as outpt. 6. Electric Burn after DC-CV on chest and back. Continue hydrocortisone to chest and apply Allevyn to back.  7. Amyloid-oncology now following. Pt can be DCed from a cardiac standpoint; FU Dr Aundra Dubin 2 weeks.  .Length of Stay: [place], MD  03/02/2017, 10:31 AM

## 2017-03-03 ENCOUNTER — Other Ambulatory Visit: Payer: Self-pay | Admitting: Radiology

## 2017-03-03 LAB — KAPPA/LAMBDA LIGHT CHAINS
KAPPA, LAMDA LIGHT CHAIN RATIO: 1.25 (ref 0.26–1.65)
Kappa free light chain: 168.2 mg/L — ABNORMAL HIGH (ref 3.3–19.4)
Lambda free light chains: 134.6 mg/L — ABNORMAL HIGH (ref 5.7–26.3)

## 2017-03-04 ENCOUNTER — Encounter: Payer: Self-pay | Admitting: Internal Medicine

## 2017-03-04 ENCOUNTER — Other Ambulatory Visit: Payer: Self-pay | Admitting: Radiology

## 2017-03-04 ENCOUNTER — Inpatient Hospital Stay: Payer: Medicare Other | Admitting: Nurse Practitioner

## 2017-03-04 ENCOUNTER — Ambulatory Visit: Payer: Medicare Other | Admitting: Gastroenterology

## 2017-03-04 ENCOUNTER — Ambulatory Visit (INDEPENDENT_AMBULATORY_CARE_PROVIDER_SITE_OTHER): Payer: Medicare Other | Admitting: Internal Medicine

## 2017-03-04 VITALS — BP 90/54 | HR 76 | Temp 97.7°F | Resp 16 | Wt 157.0 lb

## 2017-03-04 DIAGNOSIS — J90 Pleural effusion, not elsewhere classified: Secondary | ICD-10-CM

## 2017-03-04 DIAGNOSIS — M7989 Other specified soft tissue disorders: Secondary | ICD-10-CM | POA: Diagnosis not present

## 2017-03-04 DIAGNOSIS — R590 Localized enlarged lymph nodes: Secondary | ICD-10-CM

## 2017-03-04 DIAGNOSIS — R52 Pain, unspecified: Secondary | ICD-10-CM

## 2017-03-04 DIAGNOSIS — E43 Unspecified severe protein-calorie malnutrition: Secondary | ICD-10-CM

## 2017-03-04 DIAGNOSIS — J9601 Acute respiratory failure with hypoxia: Secondary | ICD-10-CM | POA: Diagnosis not present

## 2017-03-04 DIAGNOSIS — E8581 Light chain (AL) amyloidosis: Secondary | ICD-10-CM | POA: Diagnosis not present

## 2017-03-04 DIAGNOSIS — R531 Weakness: Secondary | ICD-10-CM

## 2017-03-04 DIAGNOSIS — I5032 Chronic diastolic (congestive) heart failure: Secondary | ICD-10-CM | POA: Diagnosis not present

## 2017-03-04 DIAGNOSIS — E8779 Other fluid overload: Secondary | ICD-10-CM

## 2017-03-04 DIAGNOSIS — E440 Moderate protein-calorie malnutrition: Secondary | ICD-10-CM | POA: Diagnosis not present

## 2017-03-04 DIAGNOSIS — G8929 Other chronic pain: Secondary | ICD-10-CM

## 2017-03-04 DIAGNOSIS — I5033 Acute on chronic diastolic (congestive) heart failure: Secondary | ICD-10-CM

## 2017-03-04 DIAGNOSIS — R600 Localized edema: Secondary | ICD-10-CM

## 2017-03-04 DIAGNOSIS — R627 Adult failure to thrive: Secondary | ICD-10-CM | POA: Diagnosis not present

## 2017-03-04 DIAGNOSIS — M549 Dorsalgia, unspecified: Secondary | ICD-10-CM

## 2017-03-04 LAB — MULTIPLE MYELOMA PANEL, SERUM
ALBUMIN/GLOB SERPL: 0.5 — AB (ref 0.7–1.7)
ALPHA2 GLOB SERPL ELPH-MCNC: 0.8 g/dL (ref 0.4–1.0)
Albumin SerPl Elph-Mcnc: 1.6 g/dL — ABNORMAL LOW (ref 2.9–4.4)
Alpha 1: 0.3 g/dL (ref 0.0–0.4)
B-Globulin SerPl Elph-Mcnc: 0.7 g/dL (ref 0.7–1.3)
GAMMA GLOB SERPL ELPH-MCNC: 1.5 g/dL (ref 0.4–1.8)
GLOBULIN, TOTAL: 3.3 g/dL (ref 2.2–3.9)
IGG (IMMUNOGLOBIN G), SERUM: 476 mg/dL — AB (ref 700–1600)
IGM (IMMUNOGLOBULIN M), SRM: 1757 mg/dL — AB (ref 15–143)
IgA: 72 mg/dL (ref 61–437)
M PROTEIN SERPL ELPH-MCNC: 1.2 g/dL — AB
Total Protein ELP: 4.9 g/dL — ABNORMAL LOW (ref 6.0–8.5)

## 2017-03-04 MED ORDER — DOCUSATE SODIUM 100 MG PO CAPS: 100.0000 mg | ORAL_CAPSULE | Freq: Every day | ORAL | 5 refills | Status: DC

## 2017-03-04 MED ORDER — FUROSEMIDE 40 MG PO TABS: 20.0000 mg | ORAL_TABLET | Freq: Every day | ORAL | 6 refills | Status: DC

## 2017-03-04 MED ORDER — HYDROCODONE-ACETAMINOPHEN 5-325 MG PO TABS: 1.0000 | ORAL_TABLET | Freq: Three times a day (TID) | ORAL | 0 refills | Status: DC | PRN

## 2017-03-04 NOTE — Assessment & Plan Note (Signed)
Recent admission for fluid overload and acute on chronic diastolic dysfunction Received Lasix during the hospitalization and symptoms improved Discharged on prior Lasix dose of 20 mg every other day-his wife states his fluid will likely increase on this dose Start Lasix 20 mg daily Reevaluate next week Will need kidney function and potassium rechecked

## 2017-03-04 NOTE — Progress Notes (Signed)
Subjective:    Patient ID: [name], male    DOB: [birth year], 71 y.o.   MRN: [id number]  HPI The patient is here for follow up from the hospital.  admitted 02/21/17 - 03/02/17.  Recommendations for Outpatient Follow-up:  1. Follow-up with primary care provider within a week 2. Follow-up with heme oncology within a week 3. Bone marrow biopsy as outpatient as recommended by heme oncology, patient will make the appointment 4. Follow-up with nephrology within a week - was discharged by nephrology 5. Keep appointment with your cardiologist  [name] is a 71 year old male with past medical history significant for paroxysmal A. fib on Xarelto,diastolic CHF with EF of 48-54% (05/14/16),COPD on 2L O2 at night,pulmonary hypertension status post right VATS in May 20, 2016 due toempyema, history of lung mass,pleural effusion who presented on 02/21/17 with fatigue and generalized edema.   He was admitted for acute hypoxic respiratory failure, fluid overload, chornic proteinuria w/ hypoabluminemia.  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  Final biopsy results are still pending.  Hem/onc was consulted.  Cardiology and nephrology also were consulted.    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  - hem onc follow up and BM biopsy as an outpatient  -They do not have an appointment with hematology/oncology.  His wife was unsure if they would be called or they needed to call to make that appointment.  They have several questions regarding his new diagnosis of course.  Hypoalbuminemia 2/2 chronic proteinuria:  - proteinuria since 2015 - nephrology followed while he was in the hospital.  According to the patient and his wife nephrology discharged him and they can see him if needed. Kidney function is stable  Acute on chronic hypoxic respiratory failure -Improving Likely multifactorial secondary to fluid overload her COPD exacerbation versus chronic lung mass He has  some mild shortness of breath, which is chronic.  He feels his shortness of breath is at baseline. He does have follow-up with pulmonary regarding his right lower lobe effusion  Neuropathic pain  - avoid opioids  Moderate calorie protein malnutrition  - Improving with oral supplement Patient encouraged to increase oral protein His appetite has improved slightly and he feels he is eating more, but still with protein malnutrition especially given the loss of protein We will have follow-up blood work through oncology and his PCP next week  Hypertension -BP stable while in the hospital Blood pressure slightly low here today We will need to monitor Will have home health nurse come and help monitor at home-home health ordered  Minor skin burn -From electrocardioversion -Continue hydrocortisone cream Shae Augello improving  Anemia -Hemoglobin stable, no bleeding  COPD -2 L oxygen supplementation at night  Lung mass Has an appointment with pulmonary  Fluid overload, acute on chronic diastolic CHF -Improving, anasarca resolved -Echocardiogram EF 60-65% Continue Lasix.  His wife is concerned that he is not on enough Lasix-the dose he was on prior to going into the hospital was not sufficient and he was kept on this at discharge Increase Lasix to 20 mg daily Follow-up next week and will need repeat blood work to assess kidney function and potassium He would like to go to the heart failure clinic-we will refer We will have home nurse come and help monitor his fluid status, blood pressure, edema  Chronic pain He states he has had chronic pain for some time He is not able to take NSAIDs and tramadol in the past was not effective  He has chronic back pain and shoulder pain.  He has pain from his prior VATS surgery.  He has some pain from the previous hernia surgery His pain is causing him to be less active He did receive Vicodin in the hospital and that did help I am not his primary care  physician, but he seems low likelihood to abuse narcotic pain medication He may need further evaluation of some of his chronic pain through sports medicine or orthopedics I agreed to prescribe 5 days worth of Vicodin-he will need to discuss further prescriptions with his primary care physician  Generalized weakness, physical deconditioning He needs physical therapy to improve his strength We will order home physical therapy  Left arm edema, possible lymphedema This did improve in the hospital They were advised that he would need a lymphedema sleeve He is experiencing some bilateral hand weakness Will order home occupational therapy for further evaluation   Follow-up with PCP in 5 days   Medications and allergies reviewed with patient and updated if appropriate.  Patient Active Problem List   Diagnosis Date Noted  . Light chain (AL) amyloidosis (HCC)   . Hypoxia   . Nephrotic syndrome   . Proteinuria   . Dyspnea   . Left arm swelling   . Malnutrition of moderate degree 02/23/2017  . Acute on chronic diastolic CHF (congestive heart failure) ([place])   . Elevated brain natriuretic peptide (BNP) level   . Chronic diastolic heart failure ([place]) 02/21/2017  . Hypotension 02/21/2017  . Fluid overload 02/21/2017  . Mediastinal lymphadenopathy 02/10/2017  . Generalized pain 12/30/2016  . Adjustment disorder with depressed mood 12/03/2016  . Bacteremia due to Streptococcus pneumoniae 11/19/2016  . Sepsis ([place]) 11/19/2016  . Hypoalbuminemia 11/19/2016  . Right leg pain 11/19/2016  . AKI (acute kidney injury) ([place]) 11/19/2016  . Acute urinary retention 11/19/2016  . Hyponatremia 11/18/2016  . Pneumonia 11/18/2016  . HCAP (healthcare-associated pneumonia) 11/18/2016  . History of prostate cancer 11/15/2016  . Chronic post-operative pain 11/15/2016  . Persistent atrial fibrillation ([place])   . Acute on chronic combined systolic and diastolic CHF (congestive heart failure) ([place])   .  Pulmonary HTN ([place])   . Dilated aortic root ([place])   . Septic shock ([place])   . Pedal edema   . Acute respiratory failure with hypoxia ([place]) 05/21/2016  . Lung mass 05/21/2016  . Loculated pleural effusion 05/21/2016  . Empyema ([place]) 05/21/2016  . Bilateral pleural effusion 05/14/2016  . COPD mixed type ([place]) 05/14/2016  . Tobacco abuse 05/14/2016  . Protein-calorie malnutrition, severe ([place]) 05/14/2016    Current Outpatient Prescriptions on File Prior to Visit  Medication Sig Dispense Refill  . feeding supplement (BOOST / RESOURCE BREEZE) LIQD Take 1 Container by mouth 3 (three) times daily between meals. 5 Container 0  . hydrocortisone cream 1 % Apply 1 application topically 4 (four) times daily as needed for itching. 30 g 0  . polyethylene glycol (MIRALAX / GLYCOLAX) packet Take 17 g by mouth daily as needed. 14 each 0  . rivaroxaban (XARELTO) 20 MG TABS tablet Take 1 tablet (20 mg total) by mouth daily with supper. 30 tablet 6   No current facility-administered medications on file prior to visit.     Past Medical History:  Diagnosis Date  . Anxiety   . Arthritis    "neck" (08/15/2015)  . Chronic bronchitis ([place])   . Chronic diastolic CHF (congestive heart failure) ([place])   . Constipation   . COPD (chronic  obstructive pulmonary disease) ([place])   . DDD (degenerative disc disease), cervical   . Depression   . Dilated aortic root ([place])    50m on echo 05/2016  . Edema of left lower extremity   . Facial basal cell cancer   . H/O acne vulgaris 1960s   [facility]  . Headache    history of - left temporal- years ago- not current (08/15/2015)  . Persistent atrial fibrillation (HCC)    on coumadin with CHADS2VASC score of 2  . Pneumonia 1960s; 2015 X 2  . Prostate cancer ([place] dx'd early 2000s   "low spreading; non aggressive type" (08/15/2015)  . Pulmonary HTN ([place]    PASP 471mg by echo 05/2016  . Skin cancer    "back"    Past Surgical  History:  Procedure Laterality Date  . CARDIOVERSION N/A 10/11/2016   Procedure: CARDIOVERSION;  Surgeon: CrSanda KleinMD;  [facility];  Service: Cardiovascular;  Laterality: N/A;  . CARDIOVERSION N/A 02/26/2017   Procedure: CARDIOVERSION;  Surgeon: McLarey DresserMD;  [facility];  Service: Cardiovascular;  Laterality: N/A;  . COLONOSCOPY    . EXCISIONAL HEMORRHOIDECTOMY  1960s  . INGUINAL HERNIA REPAIR Right 2002  . INGUINAL HERNIA REPAIR Bilateral 08/15/2015  . INGUINAL HERNIA REPAIR Bilateral 08/15/2015   Procedure: OPEN REPAIR RECURRENT RIGHT INGUINAL HERNIA WITH MESH AND REPAIR LEFT INGUINAL HERNIA WITH MESH;  Surgeon: HaFanny SkatesMD;  Location: [facility] Service: General;  Laterality: Bilateral;  . INSERTION OF MESH Bilateral 08/15/2015   Procedure: INSERTION OF MESH;  Surgeon: HaFanny SkatesMD;  [facility] Service: General;  Laterality: Bilateral;  . IR THORACENTESIS ASP PLEURAL SPACE W/IMG GUIDE  11/18/2016  . LYMPH NODE BIOPSY Bilateral 10/22/2016   Procedure: LEFT NECK LYMPH NODE BIOPSY;  Surgeon: VaIvin PootMD;  Location: [facility] Service: Thoracic;  Laterality: Bilateral;  . PROSTATE BIOPSY    . TONSILLECTOMY  1950s  . VIDEO ASSISTED THORACOSCOPY (VATS)/EMPYEMA Right 05/20/2016   Procedure: VIDEO ASSISTED THORACOSCOPY (VATS) with drainage of pleural effusion;  Surgeon: PeIvin PootMD;  [facility] Service: Thoracic;  Laterality: Right;  . Marland KitchenIDEO BRONCHOSCOPY WITH ENDOBRONCHIAL ULTRASOUND Right 05/20/2016   Procedure: VIDEO BRONCHOSCOPY WITH ENDOBRONCHIAL ULTRASOUND;  Surgeon: PeIvin PootMD;  [facility];  Service: Thoracic;  Laterality: Right;    Social History   Social History  . Marital status: Married    Spouse name: N/A  . Number of children: N/A  . Years of education: N/A   Social History Main Topics  . Smoking status: Former Smoker    Packs/day: 1.00    Years: 62.00    Types: Cigarettes    Quit date: 05/14/2016   . Smokeless tobacco: Never Used  . Alcohol use No  . Drug use: No     Comment: CBD OIL  . Sexual activity: Not Currently    Partners: Female   Other Topics Concern  . Not on file   Social History Narrative  . No narrative on file    Family History  Problem Relation Age of Onset  . Colon cancer Mother   . Ovarian cancer Mother   . Alcoholism Father   . CAD Father   . Alcohol abuse Father   . Ovarian cancer Sister   . Diabetes Neg Hx   . Stomach cancer Neg Hx     Review of Systems  Constitutional: Positive for fatigue. Negative for appetite change (improved), chills and fever.  Respiratory: Positive for shortness of breath (mild, chronic). Negative for cough and wheezing.   Cardiovascular: Positive for leg swelling (chronic, controlled). Negative for chest pain and palpitations.  Gastrointestinal: Positive for constipation. Negative for abdominal pain.  Musculoskeletal: Positive for arthralgias (left shoulder pain, ) and back pain (upper and lower back - chronic).  Neurological: Negative for light-headedness and headaches.       Objective:   Vitals:   03/04/17 1431  BP: (!) 90/54  Pulse: 76  Resp: 16  Temp: 97.7 F (36.5 C)   Wt Readings from Last 3 Encounters:  03/04/17 157 lb (71.2 kg)  03/02/17 152 lb 3.2 oz (69 kg)  02/21/17 165 lb 12.8 oz (75.2 kg)   Body mass index is 21.9 kg/m.   Physical Exam    Constitutional: Chronically ill-appearing, malnutritioned. No distress.  HENT:  Head: Normocephalic and atraumatic.  Neck: Neck supple. No tracheal deviation present. No thyromegaly present.  No cervical lymphadenopathy Cardiovascular: Normal rate, regular rhythm and normal heart sounds.   No murmur heard.  Left arm edema and forearm and distal upper arm there is pitting nature; bilateral 2+ ankle edema Pulmonary/Chest:   No respiratory distress, labored breathing, decreased breath sounds right lower lobe.  No wheezing or crackles.  Abdomen: Soft,  nondistended, tenderness with palpation in upper abdomen and right lower quadrant, which per patient is chronic and unchanged; no rebound or guarding Skin: Skin is warm and dry. Not diaphoretic. Burn area on the anterior chest without broken skin, surrounding redness or discharge; Oneal Schoenberger on back bandaged-Per wife they are improving Psychiatric: Normal mood and affect. Behavior is normal.   Blood work reviewed. All imaging results reviewed.   DG Bone Survey Met CLINICAL DATA:  Plasma cell dyscrasia  EXAM: METASTATIC BONE SURVEY  COMPARISON:  PET-CT 12/12/2016, 10/31/2016  FINDINGS: Multiple x-rays of the axial and appendicular skeleton were performed.  No aggressive lytic or sclerotic osseous lesion.  Moderate right pleural effusion. Bilateral interstitial thickening. Stable cardiomegaly.  No acute fracture or dislocation.  Degenerative disc disease with mild disc height loss at C3-4, C4-5 and C5-6.  S-shaped scoliosis of the thoracolumbar spine. Mild diffuse thoracolumbar spine spondylosis.  Mild osteoarthritis of the left hip.  Peripheral vascular atherosclerotic disease.  IMPRESSION: 1. No aggressive lytic or sclerotic bone lesion of the axial and appendicular skeleton.  Electronically Signed   By: Kathreen Devoid   On: 03/02/2017 12:47  Chest bilateral decubitus, 02/25/17: Impression: Moderate right pleural effusion, likely at least partially loculated laterally.  Chest x-ray 2 view, 02/25/17: Worsening small to moderate right pleural effusion and diffuse interstitial and early alveolar opacities, likely edema/CHF  Ultrasound thoracentesis, 02/23/17: Successful ultrasound-guided diagnostic and therapeutic left thoracentesis yielding 1.2 L of pleural fluid  Chest x-ray one view, 02/22/17: No pneumothorax following left thoracentesis.  Reduction in pleural fluid on the left  CT angios chest, 02/22/17: No evidence of acute pulmonary embolism.  Evidence of  congestive heart failure and right heart failure.  Chronic bilateral pleural effusions again noted with some enlargement of the left pleural effusion compared to prior studies.  There is a smaller right pleural effusion present which appears relatively stable.  Stable emphysematous lung changes  Chest x-ray, 2 view; 02/21/17 Enlarging bilateral effusions, left greater than right.  Diffuse interstitial fluid or thickening, worsened   Assessment & Plan:    55 minutes were spent face-to-face with the patient, over 50% of  which was spent counseling regarding hospital course, results of tests during the hospital stay, management of acute and chronic problems and coordination of care (referrals ordered for heart failure clinic and home health including nursing, physical therapy and occupational therapy).    See Problem List for Assessment and Plan of chronic medical problems.

## 2017-03-04 NOTE — Patient Instructions (Addendum)
Call oncology and make an appointment   Medications reviewed and updated.  Changes include increasing lasix to daily.  Starting vicodin for pain - you will need to discuss this further with [facility].  Start a stool softener.   Your prescription(s) have been submitted to your pharmacy. Please take as directed and contact our office if you believe you are having problem(s) with the medication(s).  A referral was ordered for the CHF clinic - Dr Benjamine Mola.  A referral was made of home health - nursing, PT and OT.  Please followup in with [facility] in 5 days

## 2017-03-04 NOTE — Assessment & Plan Note (Signed)
Severe protein-calorie malnutrition Appetite is improving We will need to monitor May need to see nutrition

## 2017-03-04 NOTE — Assessment & Plan Note (Signed)
He has chronic upper and lower back pain in addition to shoulder pain His pain is affecting his physical abilities and quality of life His pain likely needs to be further evaluated He did receive Vicodin in the hospital and it did help I believe he is low risk for addiction and chronic pain medication may have significant more benefit than harm I will prescribe him 5 days worth of Vicodin Advised him that he will need to discuss this further with his primary care physician regarding long-term prescriptions

## 2017-03-04 NOTE — Assessment & Plan Note (Signed)
Will refer to heart failure clinic-they would like to see Dr. Aundra Dubin

## 2017-03-04 NOTE — Assessment & Plan Note (Signed)
Possible lymphedema ?  Cause Improved while in the hospital We will have occupational therapy evaluate him for possible lymphedema sleeve

## 2017-03-04 NOTE — Assessment & Plan Note (Signed)
Right lower lobe Status post thoracentesis Has decreased breath sounds on exam Has pulmonary follow-up

## 2017-03-04 NOTE — Assessment & Plan Note (Signed)
Had significant fluid overload on admission, which improved with IV Lasix diuresis Still has some moderate edema of left arm bilateral ankles Increase Lasix to 20 mg daily and may need a higher dose Reevaluate next week

## 2017-03-04 NOTE — Assessment & Plan Note (Signed)
They will call to schedule an appointment with oncology

## 2017-03-04 NOTE — Assessment & Plan Note (Signed)
Acute on chronic respiratory failure likely related to fluid overload Improved with diuresis Uses oxygen at night, continue Has pulmonary follow-up

## 2017-03-05 ENCOUNTER — Other Ambulatory Visit: Payer: Self-pay | Admitting: Hematology

## 2017-03-05 ENCOUNTER — Other Ambulatory Visit: Payer: Self-pay | Admitting: Radiology

## 2017-03-05 DIAGNOSIS — E8581 Light chain (AL) amyloidosis: Secondary | ICD-10-CM

## 2017-03-06 ENCOUNTER — Telehealth: Payer: Self-pay

## 2017-03-06 ENCOUNTER — Telehealth (HOSPITAL_COMMUNITY): Payer: Self-pay | Admitting: Internal Medicine

## 2017-03-06 NOTE — Telephone Encounter (Signed)
Patient's wife called back and spoke with [place].  Pt unable to come 03/26/17 and was scheduled for 04/09/17 _0 :40 am.  New pt packet mailed.

## 2017-03-06 NOTE — Telephone Encounter (Addendum)
Left vm for patient to call back.  Need to scheduled New CHF Pt appt (put in notes also Pulm Htn) with Dr. Haroldine Laws.  Per Kevan Rosebush, RN, see if pt is available 03/26/17 _0  pm.

## 2017-03-06 NOTE — Telephone Encounter (Signed)
Pt scheduled for BMX 11/2. Dr. Irene Limbo requested pt to be scheduled for a f/u in the office 5-7 days after. Scheduling message sent.

## 2017-03-07 ENCOUNTER — Telehealth: Payer: Self-pay | Admitting: Nurse Practitioner

## 2017-03-07 DIAGNOSIS — Z87891 Personal history of nicotine dependence: Secondary | ICD-10-CM | POA: Diagnosis not present

## 2017-03-07 DIAGNOSIS — I739 Peripheral vascular disease, unspecified: Secondary | ICD-10-CM | POA: Diagnosis not present

## 2017-03-07 DIAGNOSIS — Z9181 History of falling: Secondary | ICD-10-CM | POA: Diagnosis not present

## 2017-03-07 DIAGNOSIS — M50922 Unspecified cervical disc disorder at C5-C6 level: Secondary | ICD-10-CM | POA: Diagnosis not present

## 2017-03-07 DIAGNOSIS — Z7901 Long term (current) use of anticoagulants: Secondary | ICD-10-CM | POA: Diagnosis not present

## 2017-03-07 DIAGNOSIS — R918 Other nonspecific abnormal finding of lung field: Secondary | ICD-10-CM | POA: Diagnosis not present

## 2017-03-07 DIAGNOSIS — M1612 Unilateral primary osteoarthritis, left hip: Secondary | ICD-10-CM | POA: Diagnosis not present

## 2017-03-07 DIAGNOSIS — G8929 Other chronic pain: Secondary | ICD-10-CM | POA: Diagnosis not present

## 2017-03-07 DIAGNOSIS — Z9981 Dependence on supplemental oxygen: Secondary | ICD-10-CM | POA: Diagnosis not present

## 2017-03-07 DIAGNOSIS — J9611 Chronic respiratory failure with hypoxia: Secondary | ICD-10-CM | POA: Diagnosis not present

## 2017-03-07 DIAGNOSIS — Z79891 Long term (current) use of opiate analgesic: Secondary | ICD-10-CM | POA: Diagnosis not present

## 2017-03-07 DIAGNOSIS — I110 Hypertensive heart disease with heart failure: Secondary | ICD-10-CM | POA: Diagnosis not present

## 2017-03-07 DIAGNOSIS — I481 Persistent atrial fibrillation: Secondary | ICD-10-CM | POA: Diagnosis not present

## 2017-03-07 DIAGNOSIS — I272 Pulmonary hypertension, unspecified: Secondary | ICD-10-CM | POA: Diagnosis not present

## 2017-03-07 DIAGNOSIS — E43 Unspecified severe protein-calorie malnutrition: Secondary | ICD-10-CM | POA: Diagnosis not present

## 2017-03-07 DIAGNOSIS — I5042 Chronic combined systolic (congestive) and diastolic (congestive) heart failure: Secondary | ICD-10-CM | POA: Diagnosis not present

## 2017-03-07 DIAGNOSIS — J439 Emphysema, unspecified: Secondary | ICD-10-CM | POA: Diagnosis not present

## 2017-03-07 DIAGNOSIS — D649 Anemia, unspecified: Secondary | ICD-10-CM | POA: Diagnosis not present

## 2017-03-07 NOTE — Telephone Encounter (Signed)
Spoke with Estill Bamberg and gave her the okay from [place].

## 2017-03-07 NOTE — Telephone Encounter (Signed)
Ok to order

## 2017-03-07 NOTE — Telephone Encounter (Signed)
Estill Bamberg from [facility] called stating that she went to visit the patient and they are doing to admit him for nursing services. She is needing verbal orders one time a week for 1 week, two times a week for 1 week and one times a week for 7 weeks. They are planning to end on 47/09 with a re-certification visit at that time.

## 2017-03-10 ENCOUNTER — Encounter: Payer: Self-pay | Admitting: Nurse Practitioner

## 2017-03-10 ENCOUNTER — Other Ambulatory Visit (INDEPENDENT_AMBULATORY_CARE_PROVIDER_SITE_OTHER): Payer: Medicare Other

## 2017-03-10 ENCOUNTER — Other Ambulatory Visit: Payer: Medicare Other

## 2017-03-10 ENCOUNTER — Ambulatory Visit (INDEPENDENT_AMBULATORY_CARE_PROVIDER_SITE_OTHER): Payer: Medicare Other | Admitting: Nurse Practitioner

## 2017-03-10 VITALS — BP 86/60 | HR 75 | Ht 71.0 in | Wt 161.0 lb

## 2017-03-10 DIAGNOSIS — J9611 Chronic respiratory failure with hypoxia: Secondary | ICD-10-CM | POA: Diagnosis not present

## 2017-03-10 DIAGNOSIS — I5043 Acute on chronic combined systolic (congestive) and diastolic (congestive) heart failure: Secondary | ICD-10-CM

## 2017-03-10 DIAGNOSIS — Z0289 Encounter for other administrative examinations: Secondary | ICD-10-CM | POA: Diagnosis not present

## 2017-03-10 DIAGNOSIS — Z7901 Long term (current) use of anticoagulants: Secondary | ICD-10-CM | POA: Diagnosis not present

## 2017-03-10 DIAGNOSIS — J439 Emphysema, unspecified: Secondary | ICD-10-CM | POA: Diagnosis not present

## 2017-03-10 DIAGNOSIS — Z9981 Dependence on supplemental oxygen: Secondary | ICD-10-CM | POA: Diagnosis not present

## 2017-03-10 DIAGNOSIS — M50922 Unspecified cervical disc disorder at C5-C6 level: Secondary | ICD-10-CM | POA: Diagnosis not present

## 2017-03-10 DIAGNOSIS — F4321 Adjustment disorder with depressed mood: Secondary | ICD-10-CM | POA: Diagnosis not present

## 2017-03-10 DIAGNOSIS — D649 Anemia, unspecified: Secondary | ICD-10-CM | POA: Diagnosis not present

## 2017-03-10 DIAGNOSIS — I739 Peripheral vascular disease, unspecified: Secondary | ICD-10-CM | POA: Diagnosis not present

## 2017-03-10 DIAGNOSIS — I110 Hypertensive heart disease with heart failure: Secondary | ICD-10-CM | POA: Diagnosis not present

## 2017-03-10 DIAGNOSIS — E43 Unspecified severe protein-calorie malnutrition: Secondary | ICD-10-CM | POA: Diagnosis not present

## 2017-03-10 DIAGNOSIS — R52 Pain, unspecified: Principal | ICD-10-CM

## 2017-03-10 DIAGNOSIS — I481 Persistent atrial fibrillation: Secondary | ICD-10-CM | POA: Diagnosis not present

## 2017-03-10 DIAGNOSIS — R918 Other nonspecific abnormal finding of lung field: Secondary | ICD-10-CM | POA: Diagnosis not present

## 2017-03-10 DIAGNOSIS — I272 Pulmonary hypertension, unspecified: Secondary | ICD-10-CM | POA: Diagnosis not present

## 2017-03-10 DIAGNOSIS — G8929 Other chronic pain: Secondary | ICD-10-CM

## 2017-03-10 DIAGNOSIS — Z9181 History of falling: Secondary | ICD-10-CM | POA: Diagnosis not present

## 2017-03-10 DIAGNOSIS — Z87891 Personal history of nicotine dependence: Secondary | ICD-10-CM | POA: Diagnosis not present

## 2017-03-10 DIAGNOSIS — I5042 Chronic combined systolic (congestive) and diastolic (congestive) heart failure: Secondary | ICD-10-CM | POA: Diagnosis not present

## 2017-03-10 DIAGNOSIS — M1612 Unilateral primary osteoarthritis, left hip: Secondary | ICD-10-CM | POA: Diagnosis not present

## 2017-03-10 DIAGNOSIS — Z79891 Long term (current) use of opiate analgesic: Secondary | ICD-10-CM | POA: Diagnosis not present

## 2017-03-10 DIAGNOSIS — G8928 Other chronic postprocedural pain: Secondary | ICD-10-CM

## 2017-03-10 LAB — BASIC METABOLIC PANEL
BUN: 32 mg/dL — AB (ref 6–23)
CALCIUM: 8.3 mg/dL — AB (ref 8.4–10.5)
CHLORIDE: 100 meq/L (ref 96–112)
CO2: 31 mEq/L (ref 19–32)
CREATININE: 0.89 mg/dL (ref 0.40–1.50)
GFR: 88.87 mL/min (ref >60.00)
GLUCOSE: 95 mg/dL (ref 70–99)
Potassium: 4.4 mEq/L (ref 3.5–5.1)
Sodium: 136 mEq/L (ref 135–145)

## 2017-03-10 MED ORDER — HYDROCODONE-ACETAMINOPHEN 5-325 MG PO TABS: 1.0000 | ORAL_TABLET | Freq: Three times a day (TID) | ORAL | 0 refills | Status: DC | PRN

## 2017-03-10 MED ORDER — GABAPENTIN 300 MG PO CAPS: 300.0000 mg | ORAL_CAPSULE | Freq: Two times a day (BID) | ORAL | 0 refills | Status: DC

## 2017-03-10 NOTE — Assessment & Plan Note (Signed)
Depressed Mood and insomnia could be related to pain and/or declining health in last 63month. Remeron and cymbalta were reported not to be helpful. Consider trazodone or SSRI or nortriptyline? Also consider psychology counseling?

## 2017-03-10 NOTE — Progress Notes (Addendum)
Subjective:  Patient ID: [name], male    DOB: 1945/12/07  Age: 71 y.o. MRN: [id number]  CC: Follow-up (follow up on pain med/ bone marrow biopsy schedule on 03/14/2017--wants consult about this. ) and Hand Problem (left hand swelling since last night)   HPI Indication for chronic opioid: back pain (unknown cause at this time) Rate Pain (1-10 scale):5-6/10 Medication and dose: hydrcodone/acetaminophen 5/310m # pills per month: 30 Last UDS date:none Pain contract signed (Y/N): yes Date narcotic database last reviewed (include red flags): none Pharmacy Used: [facility] on [address] [place] Chronic generalized Pain: Acute on chronic Worse on back and chest wall. Onset after inguinal hernia repair and VATS 05/2016. Waxing and waning with multiple hospitalization and invasive procedures. Worse since recent radiology imaging that required laying supine for 429ms. Tramadol used in past with minimal relief. Stopped taking gabapentin and cymbalta  (unable to remember how long he took medication before stopping, states medications did not help) Rates pain at 5/10-6/10 constant. Sometimes at 10/10. Has difficulty with ADLs and sleep due to pain (sleeps in 2hr increments) Reports difficulty to deep breathing. Rates pain at 3/10-4/10 with 1tab of hydrocodone/acetaminophen twice a day. Also reports improved sleep and deep breathing with use of vicodin Denies any adverse effects with opioid at this time. Denies use of ETOH or any other illicit drug.  Outpatient Medications Prior to Visit  Medication Sig Dispense Refill  . docusate sodium (COLACE) 100 MG capsule Take 1-3 capsules (100-300 mg total) by mouth daily. 90 capsule 5  . feeding supplement (BOOST / RESOURCE BREEZE) LIQD Take 1 Container by mouth 3 (three) times daily between meals. 5 Container 0  . furosemide (LASIX) 40 MG tablet Take 0.5 tablets (20 mg total) by mouth daily. 30 tablet 6  . hydrocortisone cream 1 %  Apply 1 application topically 4 (four) times daily as needed for itching. 30 g 0  . polyethylene glycol (MIRALAX / GLYCOLAX) packet Take 17 g by mouth daily as needed. 14 each 0  . rivaroxaban (XARELTO) 20 MG TABS tablet Take 1 tablet (20 mg total) by mouth daily with supper. 30 tablet 6  . HYDROcodone-acetaminophen (NORCO/VICODIN) 5-325 MG tablet Take 1-2 tablets by mouth every 8 (eight) hours as needed (severe pain in back). 30 tablet 0   No facility-administered medications prior to visit.     ROS See HPI  Objective:  BP (!) 86/60   Pulse 75   Ht 5' 11" (1.803 m)   Wt 161 lb (73 kg)   SpO2 97%   BMI 22.45 kg/m   BP Readings from Last 3 Encounters:  03/10/17 (!) 86/60  03/04/17 (!) 90/54  03/02/17 115/74    Wt Readings from Last 3 Encounters:  03/10/17 161 lb (73 kg)  03/04/17 157 lb (71.2 kg)  03/02/17 152 lb 3.2 oz (69 kg)    Physical Exam  Constitutional: He is oriented to person, place, and time.  Cardiovascular: Normal rate.   Pulmonary/Chest: Effort normal.  Musculoskeletal: He exhibits edema and tenderness.  Standing with 1assist  Neurological: He is alert and oriented to person, place, and time.  Psychiatric: He has a normal mood and affect. His behavior is normal. Thought content normal.  Vitals reviewed.   Lab Results  Component Value Date   WBC 8.0 03/02/2017   HGB 10.5 (L) 03/02/2017   HCT 32.1 (L) 03/02/2017   PLT 229 03/02/2017   GLUCOSE 95 03/10/2017   CHOL 115 02/24/2017  TRIG 51 02/24/2017   HDL 39 (L) 02/24/2017   LDLCALC 66 02/24/2017   ALT 16 (L) 02/28/2017   AST 25 02/28/2017   NA 136 03/10/2017   K 4.4 03/10/2017   CL 100 03/10/2017   CREATININE 0.89 03/10/2017   BUN 32 (H) 03/10/2017   CO2 31 03/10/2017   TSH 2.489 02/22/2017   PSA 1.7 11/01/2016   INR 1.44 02/27/2017    Dg Chest 1 View  Result Date: 02/22/2017 CLINICAL DATA:  Status post LEFT thoracentesis EXAM: CHEST 1 VIEW COMPARISON:  Chest radiograph 02/21/2017  FINDINGS: Interval reduction of LEFT pleural fluid volume. Small effusion remains on the LEFT. No LEFT pneumothorax. Small loculated RIGHT pleural effusion additionally. IMPRESSION: No pneumothorax following LEFT thoracentesis. Reduction in pleural fluid on the LEFT. Electronically Signed   By: Suzy Bouchard M.D.   On: 02/22/2017 16:10   Dg Chest 2 View  Result Date: 02/21/2017 CLINICAL DATA:  Left upper extremity swelling for 3 months. Worsening fatigue. Dyspnea. EXAM: CHEST  2 VIEW COMPARISON:  12/11/2016 FINDINGS: Moderate pleural effusions bilaterally, enlarged. Diffuse interstitial thickening or fluid, worsened. Moderate cardiomegaly, unchanged. Basilar lung opacity adjacent to the pleural fluid collections, left greater than right, atelectasis versus infectious infiltrate. IMPRESSION: Enlarging bilateral effusions, left greater than right. Diffuse interstitial fluid or thickening, worsened. Electronically Signed   By: Andreas Newport M.D.   On: 02/21/2017 18:07   Ct Angio Chest Pe W Or Wo Contrast  Result Date: 02/22/2017 CLINICAL DATA:  Shortness of breath. Bilateral pleural effusions. History of prostate carcinoma. EXAM: CT ANGIOGRAPHY CHEST WITH CONTRAST TECHNIQUE: Multidetector CT imaging of the chest was performed using the standard protocol during bolus administration of intravenous contrast. Multiplanar CT image reconstructions and MIPs were obtained to evaluate the vascular anatomy. CONTRAST:  100 mL Isovue 370 IV COMPARISON:  CT of the chest without contrast on 11/18/2016 and PET scan on 12/12/2016 FINDINGS: Cardiovascular: The pulmonary arteries are densely opacified with contrast. There is no evidence of pulmonary embolism. No pericardial fluid. There is some reflux of contrast into the IVC and hepatic veins consistent with right heart failure. Mediastinum/Nodes: No enlarged mediastinal, hilar, or axillary lymph nodes. Thyroid gland, trachea, and esophagus demonstrate no significant  findings. Lungs/Pleura: Bilateral pleural effusions again noted. A smaller right pleural effusion appears stable compared to prior imaging. There is some enlargement of left pleural fluid volume which is now moderate. Lungs show evidence of stable emphysema and probable congestive heart failure. No focal airspace consolidation or pneumothorax identified. Upper Abdomen: No acute abnormality. Musculoskeletal: No chest wall abnormality. No acute or significant osseous findings. Review of the MIP images confirms the above findings. IMPRESSION: 1. No evidence of acute pulmonary embolism. 2. Evidence of congestive heart failure and right heart failure. Chronic bilateral pleural effusions again noted with some enlargement of the left pleural effusion compared to prior studies. There is a smaller right pleural effusion present which appears relatively stable in volume. 3. Stable emphysematous lung disease. Emphysema (ICD10-J43.9). Electronically Signed   By: Aletta Edouard M.D.   On: 02/22/2017 14:09   US Thoracentesis Asp Pleural Space W/img Guide  Result Date: 02/23/2017 INDICATION: Hit with history of CHF and anasarca, now with pleural effusion. Request is made for diagnostic and therapeutic thoracentesis on the left. EXAM: ULTRASOUND GUIDED DIAGNOSTIC AND THERAPEUTIC LEFT THORACENTESIS MEDICATIONS: 10 mL 1% lidocaine COMPLICATIONS: None immediate. PROCEDURE: An ultrasound guided thoracentesis was thoroughly discussed with the patient and questions answered. The benefits, risks, alternatives and complications were  also discussed. The patient understands and wishes to proceed with the procedure. Written consent was obtained. Ultrasound was performed to localize and mark an adequate pocket of fluid in the left chest. The area was then prepped and draped in the normal sterile fashion. 1% Lidocaine was used for local anesthesia. Under ultrasound guidance a Safe-T-Centesis catheter was introduced. Thoracentesis was  performed. The catheter was removed and a dressing applied. FINDINGS: A total of approximately 1.2 liters of pale yellow fluid was removed. Samples were sent to the laboratory as requested by the clinical team. Limited ultrasound of the chest on the right shows loculated pleural fluid. Note patient did undergo a right VATS procedure on 05/20/2016. IMPRESSION: Successful ultrasound guided diagnostic and therapeutic left thoracentesis yielding 1.2 liters of pleural fluid. Electronically Signed   By: Aletta Edouard M.D.   On: 02/22/2017 16:16    Assessment & Plan:   I discussed the need for chronic pain management with hydrocodone/acetaminophen 5/346m with Dr. PAlain Marion(supervising Physician). He agreed with treatment plans. Advised to do UDS and ETOH during next OV.  WWendellwas seen today for follow-up and hand problem.  Diagnoses and all orders for this visit:  Chronic generalized pain disorder -     HYDROcodone-acetaminophen (NORCO/VICODIN) 5-325 MG tablet; Take 1 tablet by mouth every 8 (eight) hours as needed (severe pain in back). -     gabapentin (NEURONTIN) 300 MG capsule; Take 1 capsule (300 mg total) by mouth 2 (two) times daily. -     Ambulatory referral to Pain Clinic  Acute on chronic combined systolic and diastolic CHF (congestive heart failure) (HCC) -     Basic metabolic panel; Future  Chronic post-operative pain -     HYDROcodone-acetaminophen (NORCO/VICODIN) 5-325 MG tablet; Take 1 tablet by mouth every 8 (eight) hours as needed (severe pain in back). -     gabapentin (NEURONTIN) 300 MG capsule; Take 1 capsule (300 mg total) by mouth 2 (two) times daily. -     Ambulatory referral to Pain Clinic  Pain management contract signed -     HYDROcodone-acetaminophen (NORCO/VICODIN) 5-325 MG tablet; Take 1 tablet by mouth every 8 (eight) hours as needed (severe pain in back). -     gabapentin (NEURONTIN) 300 MG capsule; Take 1 capsule (300 mg total) by mouth 2 (two) times  daily.  Adjustment disorder with depressed mood   I have changed Mr. Frisbie's HYDROcodone-acetaminophen. I am also having him start on gabapentin. Additionally, I am having him maintain his polyethylene glycol, rivaroxaban, feeding supplement, hydrocortisone cream, docusate sodium, and furosemide.  Meds ordered this encounter  Medications  . HYDROcodone-acetaminophen (NORCO/VICODIN) 5-325 MG tablet    Sig: Take 1 tablet by mouth every 8 (eight) hours as needed (severe pain in back).    Dispense:  30 tablet    Refill:  0    Order Specific Question:   Supervising Provider    Answer:   PCassandria Anger[[id number]]  . gabapentin (NEURONTIN) 300 MG capsule    Sig: Take 1 capsule (300 mg total) by mouth 2 (two) times daily.    Dispense:  60 capsule    Refill:  0    Order Specific Question:   Supervising Provider    Answer:   PCassandria Anger[[id number]]    Follow-up: Return in about 2 weeks (around 03/24/2017) for chronic pain management (needs UDS, ETOH, and PHQ9 screen).  CWilfred Lacy NP

## 2017-03-10 NOTE — Assessment & Plan Note (Signed)
Start gabapentin as prescribed. Use Hydrocodone for breakthrough pain.  I have discussed [place] Pain management policy, risk vs benefit of chronic opioid use (dependence, constipation, respiration suppression due to overdose). I advised patinent that the goal of chronic pain management is to increase functionality and improve sleep.  Use colace and miralax as prescribed to prevent constipation.  I have entered referral to pain clinic. You will be contacted to schedule appt.  Provided patient and wife with signed copy of pain contract and pain management policy.  F/up in 2weeks, needs UDS

## 2017-03-10 NOTE — Patient Instructions (Addendum)
Stable BMP, continue current dose of furosemide.  Start gabapentin as prescribed. Use Hydrocodone for breakthrough pain.  I have discussed [place] Pain management policy, risk vs benefit of chronic opioid use (dependence, constipation, respiration suppression due to overdose). I advised patinent that the goal of chronic pain management is to increase functionality and improve sleep.  Use colace and miralax as prescribed to prevent constipation.  I have entered referral to pain clinic. You will be contacted to schedule appt.  Provided patient and wife with signed copy of pain contract and pain management policy.  Chronic Back Pain When back pain lasts longer than 3 months, it is called chronic back pain.The cause of your back pain may not be known. Some common causes include:  Wear and tear (degenerative disease) of the bones, ligaments, or disks in your back.  Inflammation and stiffness in your back (arthritis).  People who have chronic back pain often go through certain periods in which the pain is more intense (flare-ups). Many people can learn to manage the pain with home care. Follow these instructions at home: Pay attention to any changes in your symptoms. Take these actions to help with your pain: Activity  Avoid bending and activities that make the problem worse.  Do not sit or stand in one place for long periods of time.  Take brief periods of rest throughout the day. This will reduce your pain. Resting in a lying or standing position is usually better than sitting to rest.  When you are resting for longer periods, mix in some mild activity or stretching between periods of rest. This will help to prevent stiffness and pain.  Get regular exercise. Ask your health care provider what activities are safe for you.  Do not lift anything that is heavier than 10 lb (4.5 kg). Always use proper lifting technique, which includes: ? Bending your knees. ? Keeping the load close to  your body. ? Avoiding twisting. Managing pain  If directed, apply ice to the painful area. Your health care provider may recommend applying ice during the first 24-48 hours after a flare-up begins. ? Put ice in a plastic bag. ? Place a towel between your skin and the bag. ? Leave the ice on for 20 minutes, 2-3 times per day.  After icing, apply heat to the affected area as often as told by your health care provider. Use the heat source that your health care provider recommends, such as a moist heat pack or a heating pad. ? Place a towel between your skin and the heat source. ? Leave the heat on for 20-30 minutes. ? Remove the heat if your skin turns bright red. This is especially important if you are unable to feel pain, heat, or cold. You may have a greater risk of getting burned.  Try soaking in a warm tub.  Take over-the-counter and prescription medicines only as told by your health care provider.  Keep all follow-up visits as told by your health care provider. This is important. Contact a health care provider if:  You have pain that is not relieved with rest or medicine. Get help right away if:  You have weakness or numbness in one or both of your legs or feet.  You have trouble controlling your bladder or your bowels.  You have nausea or vomiting.  You have pain in your abdomen.  You have shortness of breath or you faint. This information is not intended to replace advice given to you by your health  care provider. Make sure you discuss any questions you have with your health care provider. Document Released: 06/06/2004 Document Revised: 09/07/2015 Document Reviewed: 10/17/2014 Elsevier Interactive Patient Education  2018 [employer].

## 2017-03-11 ENCOUNTER — Telehealth: Payer: Self-pay | Admitting: Hematology

## 2017-03-11 ENCOUNTER — Telehealth: Payer: Self-pay | Admitting: Nurse Practitioner

## 2017-03-11 ENCOUNTER — Encounter: Payer: Self-pay | Admitting: Physical Medicine & Rehabilitation

## 2017-03-11 ENCOUNTER — Telehealth: Payer: Self-pay | Admitting: Cardiology

## 2017-03-11 DIAGNOSIS — Z87891 Personal history of nicotine dependence: Secondary | ICD-10-CM | POA: Diagnosis not present

## 2017-03-11 DIAGNOSIS — G8929 Other chronic pain: Secondary | ICD-10-CM | POA: Diagnosis not present

## 2017-03-11 DIAGNOSIS — R918 Other nonspecific abnormal finding of lung field: Secondary | ICD-10-CM | POA: Diagnosis not present

## 2017-03-11 DIAGNOSIS — Z7901 Long term (current) use of anticoagulants: Secondary | ICD-10-CM | POA: Diagnosis not present

## 2017-03-11 DIAGNOSIS — J9611 Chronic respiratory failure with hypoxia: Secondary | ICD-10-CM | POA: Diagnosis not present

## 2017-03-11 DIAGNOSIS — I5042 Chronic combined systolic (congestive) and diastolic (congestive) heart failure: Secondary | ICD-10-CM | POA: Diagnosis not present

## 2017-03-11 DIAGNOSIS — Z79891 Long term (current) use of opiate analgesic: Secondary | ICD-10-CM | POA: Diagnosis not present

## 2017-03-11 DIAGNOSIS — Z9981 Dependence on supplemental oxygen: Secondary | ICD-10-CM | POA: Diagnosis not present

## 2017-03-11 DIAGNOSIS — I481 Persistent atrial fibrillation: Secondary | ICD-10-CM | POA: Diagnosis not present

## 2017-03-11 DIAGNOSIS — I110 Hypertensive heart disease with heart failure: Secondary | ICD-10-CM | POA: Diagnosis not present

## 2017-03-11 DIAGNOSIS — I739 Peripheral vascular disease, unspecified: Secondary | ICD-10-CM | POA: Diagnosis not present

## 2017-03-11 DIAGNOSIS — I272 Pulmonary hypertension, unspecified: Secondary | ICD-10-CM | POA: Diagnosis not present

## 2017-03-11 DIAGNOSIS — D649 Anemia, unspecified: Secondary | ICD-10-CM | POA: Diagnosis not present

## 2017-03-11 DIAGNOSIS — J439 Emphysema, unspecified: Secondary | ICD-10-CM | POA: Diagnosis not present

## 2017-03-11 DIAGNOSIS — Z9181 History of falling: Secondary | ICD-10-CM | POA: Diagnosis not present

## 2017-03-11 DIAGNOSIS — M50922 Unspecified cervical disc disorder at C5-C6 level: Secondary | ICD-10-CM | POA: Diagnosis not present

## 2017-03-11 DIAGNOSIS — M1612 Unilateral primary osteoarthritis, left hip: Secondary | ICD-10-CM | POA: Diagnosis not present

## 2017-03-11 DIAGNOSIS — E43 Unspecified severe protein-calorie malnutrition: Secondary | ICD-10-CM | POA: Diagnosis not present

## 2017-03-11 NOTE — Telephone Encounter (Signed)
Patient scheduled per 10/25 sch message. Spoke to patients wife regarding updated schedule.

## 2017-03-11 NOTE — Telephone Encounter (Signed)
LM giving ok from [facility].

## 2017-03-11 NOTE — Telephone Encounter (Signed)
Patient's wife reports a weight gain of about 7 pounds since the patient was discharged from the hospital last Sunday.  She states the swelling in his legs is obviously worse as well.  He is very fatigued. "He is no more short of breath than usual." She states his BP and HR are "fine." He is taking Lasix 20 mg daily.  Instructed her to have patient take an extra half dose of Lasix today (creatinine = 0.89 10/29) and a whole pill in the AM if BP is holding. Scheduled patient tomorrow with Truitt Merle, NP for evaluation. Patient's wife was grateful for call and agrees with treatment plan.

## 2017-03-11 NOTE — Telephone Encounter (Signed)
Needs verbals for OT for 2week4 Can LVM to give orders.

## 2017-03-11 NOTE — Telephone Encounter (Signed)
New message    Call from [facility] [place] [phone] calling to inform staff of swelling. Patient has swelling.  Pt c/o swelling: STAT is pt has developed SOB within 24 hours  1) How much weight have you gained and in what time span?  In 4 days gained 4 lbs  2) If swelling, where is the swelling located? legs  3) Are you currently taking a fluid pill? yes  4) Are you currently SOB? 89% on room air  5) Do you have a log of your daily weights (if so, list)? 159.2, 159.4, 155.4  6) Have you gained 3 pounds in a day or 5 pounds in a week? Close to 5 pounds   7) Have you traveled recently? No

## 2017-03-11 NOTE — Telephone Encounter (Signed)
ok 

## 2017-03-12 ENCOUNTER — Encounter: Payer: Self-pay | Admitting: Nurse Practitioner

## 2017-03-12 ENCOUNTER — Encounter (INDEPENDENT_AMBULATORY_CARE_PROVIDER_SITE_OTHER): Payer: Self-pay

## 2017-03-12 ENCOUNTER — Ambulatory Visit (INDEPENDENT_AMBULATORY_CARE_PROVIDER_SITE_OTHER): Payer: Medicare Other | Admitting: Nurse Practitioner

## 2017-03-12 ENCOUNTER — Other Ambulatory Visit: Payer: Self-pay | Admitting: Radiology

## 2017-03-12 VITALS — BP 80/40 | HR 81 | Ht 71.0 in | Wt 158.1 lb

## 2017-03-12 DIAGNOSIS — D649 Anemia, unspecified: Secondary | ICD-10-CM | POA: Diagnosis not present

## 2017-03-12 DIAGNOSIS — M50922 Unspecified cervical disc disorder at C5-C6 level: Secondary | ICD-10-CM | POA: Diagnosis not present

## 2017-03-12 DIAGNOSIS — I272 Pulmonary hypertension, unspecified: Secondary | ICD-10-CM | POA: Diagnosis not present

## 2017-03-12 DIAGNOSIS — N049 Nephrotic syndrome with unspecified morphologic changes: Secondary | ICD-10-CM | POA: Diagnosis not present

## 2017-03-12 DIAGNOSIS — J9611 Chronic respiratory failure with hypoxia: Secondary | ICD-10-CM | POA: Diagnosis not present

## 2017-03-12 DIAGNOSIS — I5042 Chronic combined systolic (congestive) and diastolic (congestive) heart failure: Secondary | ICD-10-CM | POA: Diagnosis not present

## 2017-03-12 DIAGNOSIS — Z79891 Long term (current) use of opiate analgesic: Secondary | ICD-10-CM | POA: Diagnosis not present

## 2017-03-12 DIAGNOSIS — I481 Persistent atrial fibrillation: Secondary | ICD-10-CM | POA: Diagnosis not present

## 2017-03-12 DIAGNOSIS — Z9181 History of falling: Secondary | ICD-10-CM | POA: Diagnosis not present

## 2017-03-12 DIAGNOSIS — R918 Other nonspecific abnormal finding of lung field: Secondary | ICD-10-CM | POA: Diagnosis not present

## 2017-03-12 DIAGNOSIS — I5033 Acute on chronic diastolic (congestive) heart failure: Secondary | ICD-10-CM

## 2017-03-12 DIAGNOSIS — I110 Hypertensive heart disease with heart failure: Secondary | ICD-10-CM | POA: Diagnosis not present

## 2017-03-12 DIAGNOSIS — I4819 Other persistent atrial fibrillation: Secondary | ICD-10-CM

## 2017-03-12 DIAGNOSIS — Z9981 Dependence on supplemental oxygen: Secondary | ICD-10-CM | POA: Diagnosis not present

## 2017-03-12 DIAGNOSIS — I739 Peripheral vascular disease, unspecified: Secondary | ICD-10-CM | POA: Diagnosis not present

## 2017-03-12 DIAGNOSIS — M1612 Unilateral primary osteoarthritis, left hip: Secondary | ICD-10-CM | POA: Diagnosis not present

## 2017-03-12 DIAGNOSIS — J439 Emphysema, unspecified: Secondary | ICD-10-CM | POA: Diagnosis not present

## 2017-03-12 DIAGNOSIS — R600 Localized edema: Secondary | ICD-10-CM | POA: Diagnosis not present

## 2017-03-12 DIAGNOSIS — Z7901 Long term (current) use of anticoagulants: Secondary | ICD-10-CM | POA: Diagnosis not present

## 2017-03-12 DIAGNOSIS — Z87891 Personal history of nicotine dependence: Secondary | ICD-10-CM | POA: Diagnosis not present

## 2017-03-12 DIAGNOSIS — G8929 Other chronic pain: Secondary | ICD-10-CM | POA: Diagnosis not present

## 2017-03-12 DIAGNOSIS — E43 Unspecified severe protein-calorie malnutrition: Secondary | ICD-10-CM | POA: Diagnosis not present

## 2017-03-12 MED ORDER — MIDODRINE HCL 5 MG PO TABS: 5.0000 mg | ORAL_TABLET | Freq: Three times a day (TID) | ORAL | 3 refills | Status: DC

## 2017-03-12 MED ORDER — FUROSEMIDE 40 MG PO TABS: 40.0000 mg | ORAL_TABLET | Freq: Two times a day (BID) | ORAL | 6 refills | Status: DC

## 2017-03-12 NOTE — Patient Instructions (Addendum)
We will be checking the following labs today - BMET & CBC   Medication Instructions:    Continue with your current medicines. BUT  I am adding Midodrine 5 mg to take three times a time  I am increasing your Lasix to 40 mg twice a day for 5 days - then just 40 mg a day     Testing/Procedures To Be Arranged:  N/A  Follow-Up:   See Dr. Aundra Dubin in the [place] clinic at [facility] - follow the signs to "Heart and Vascular" code for the gate is 8001  Keep appointments for your bone marrow biopsy for Friday    Other Special Instructions:   Keep weighing daily    If you need a refill on your cardiac medications before your next appointment, please call your pharmacy.   Call the [place] office at [phone] if you have any questions, problems or concerns.

## 2017-03-12 NOTE — Progress Notes (Signed)
CARDIOLOGY OFFICE NOTE  Date:  03/12/2017    [name] Date of Birth: [birth year] Medical Record #[id number]  PCP:  Flossie Buffy, NP  Cardiologist:  Radford Pax   Chief Complaint  Patient presents with  . Congestive Heart Failure  . Edema    Work in visit - seen for Dr. Radford Pax    History of Present Illness:  [name] is a 71 y.o. male who presents today for a work in visit. Seen for Dr. Radford Pax. Apparently his care was to be transitioned over the CHF clinic with Dr. Aundra Dubin.   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  PAF on Xarelto status post DCCV 10/11/16 and again 24/8250, chronic diastolic CHF with LVEF 03-70% on echo 05/14/16, COPD with pulmonary hypertension, s/p right VATS 05/20/16 for empyema with a concern for underlying lung CA with lymph node biopsy 10/22/16 negative for CA. He saw Dr. Prescott Gum and was noted to be in A. fib and was hypotensive. His diuretics were cut back and he then developed leg edema. Has chronic pain disorder - on chronic narcotics.   He saw Kerin Ransom, [place] 11/15/16 at which time he was not feeling well. He was unable to tell when he was in atrial fibrillation. He had persistent anorexia. Aldactone 12.5 mg daily was resumed.  He was readmitted to the hospital 11/17/16 with bacteremia due to Streptococcus pneumonia. Patient also had anasarca and was treated with IV Lasix. Weight decreased from 184 pounds to 170 pounds. 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   He was seen by Estella Husk, PA 12/09/2016 and weight was 159.  He complained of having no appetite. His edema had improved and he was back on Lasix 40 mg once daily.   Last seen by Dr. Radford Pax just about 2 weeks ago - continued to be short of breath  and having edema. Noted that his diuretic regimen has been difficult to manage due to soft BP. He was referred on for admission back to [facility].   Admitted with respiratory failure. Seen by cardiology, nephrology and hematology during that admission. New diagnosis of AL amyloidosis. Needs BMS as outpatient. ? Chronic lung mass noted as well. His respiratory failure was felt to be multifactorial in nature.Urine protein/creatinine ratio 6.67 from 10/14, suggestive of nephrotic-range proteinuria.  He was in AF and was cardioverted back to NSR on 02/26/2017. Looks like he was referred to the HF clinic with Dr. Aundra Dubin. Did require thoracentesis for pleural effusion on the left with 1.2 liters obtained. Will possibly need cardiac MRI arranged to rule out cardiac involvement. Was felt that he was would be treated with CyBorD regimen.   Seen in PCP office earlier this week to get his pain medicines - referred to the pain clinic. For BMS later this week.   Phone call yesterday - "Patient's wife reports a weight gain of about 7 pounds since the patient was discharged from the hospital last Sunday. She states the swelling in his legs is obviously worse as well. He is very fatigued. "He is no more short of breath than usual." She states his BP and HR are "fine." He is taking Lasix 20 mg daily.  Instructed her to have patient take an extra half dose of Lasix today (creatinine =  0.89 10/29) and a whole pill in the AM if BP is holding.  Scheduled patient tomorrow with Truitt Merle, NP for evaluation. Patient's wife was grateful for call and agrees with treatment plan".   Thus added to my schedule for today.   Comes in today. Here with his wife. He is in a wheelchair. Has not really done well since discharge. Feels "like crap". His skin burns still are bothersome but improving. He has chronic pain as well. His back pain was very much exacerbated while he was in the hospital from his testing. Some shortness of breath -  especially in the mornings. Back on his oxygen during the day as well as night. His weight is climbing. Edema "way bad" since discharge. Weight 159 yesterday at home. Asking lots of questions that I do not have the answers too. They were under the impression that Dr. Aundra Dubin would be "taking over".   Past Medical History:  Diagnosis Date  . Anxiety   . Arthritis    "neck" (08/15/2015)  . Chronic bronchitis ([place])   . Chronic diastolic CHF (congestive heart failure) ([place])   . Constipation   . COPD (chronic obstructive pulmonary disease) ([place])   . DDD (degenerative disc disease), cervical   . Depression   . Dilated aortic root ([place])    65m on echo 05/2016  . Edema of left lower extremity   . Facial basal cell cancer   . H/O acne vulgaris 1960s   [facility]  . Headache    history of - left temporal- years ago- not current (08/15/2015)  . Persistent atrial fibrillation (HCC)    on coumadin with CHADS2VASC score of 2  . Pneumonia 1960s; 2015 X 2  . Prostate cancer ([place] dx'd early 2000s   "low spreading; non aggressive type" (08/15/2015)  . Pulmonary HTN ([place]    PASP 438mg by echo 05/2016  . Skin cancer    "back"    Past Surgical History:  Procedure Laterality Date  . CARDIOVERSION N/A 10/11/2016   Procedure: CARDIOVERSION;  Surgeon: CrSanda KleinMD;  [facility];  Service: Cardiovascular;  Laterality: N/A;  . CARDIOVERSION N/A 02/26/2017   Procedure: CARDIOVERSION;  Surgeon: McLarey DresserMD;  [facility];  Service: Cardiovascular;  Laterality: N/A;  . COLONOSCOPY    . EXCISIONAL HEMORRHOIDECTOMY  1960s  . INGUINAL HERNIA REPAIR Right 2002  . INGUINAL HERNIA REPAIR Bilateral 08/15/2015  . INGUINAL HERNIA REPAIR Bilateral 08/15/2015   Procedure: OPEN REPAIR RECURRENT RIGHT INGUINAL HERNIA WITH MESH AND REPAIR LEFT INGUINAL HERNIA WITH MESH;  Surgeon: HaFanny SkatesMD;  [place] [facility]: General;  Laterality:  Bilateral;  . INSERTION OF MESH Bilateral 08/15/2015   Procedure: INSERTION OF MESH;  Surgeon: HaFanny SkatesMD;  [facility] Service: General;  Laterality: Bilateral;  . IR THORACENTESIS ASP PLEURAL SPACE W/IMG GUIDE  11/18/2016  . LYMPH NODE BIOPSY Bilateral 10/22/2016   Procedure: LEFT NECK LYMPH NODE BIOPSY;  Surgeon: VaIvin PootMD;  [place] Service: Thoracic;  Laterality: Bilateral;  . PROSTATE BIOPSY    . TONSILLECTOMY  1950s  . VIDEO ASSISTED THORACOSCOPY (VATS)/EMPYEMA Right 05/20/2016   Procedure: VIDEO ASSISTED THORACOSCOPY (VATS) with drainage of pleural effusion;  Surgeon: PeIvin PootMD;  [place] Service: Thoracic;  Laterality: Right;  . Marland KitchenIDEO BRONCHOSCOPY WITH ENDOBRONCHIAL ULTRASOUND Right 05/20/2016   Procedure: VIDEO BRONCHOSCOPY WITH ENDOBRONCHIAL ULTRASOUND;  Surgeon: [name], MD;  [facility];  Service: Thoracic;  Laterality: Right;     Medications: Current Meds  Medication Sig  . docusate sodium (COLACE) 100 MG capsule Take 1-3 capsules (100-300 mg total) by mouth daily.  . feeding supplement (BOOST / RESOURCE BREEZE) LIQD Take 1 Container by mouth 3 (three) times daily between meals.  . furosemide (LASIX) 40 MG tablet Take 1 tablet (40 mg total) by mouth 2 (two) times daily. 40 mg BID for 5 days then 40 mg a day  . gabapentin (NEURONTIN) 300 MG capsule Take 1 capsule (300 mg total) by mouth 2 (two) times daily.  [name] HYDROcodone-acetaminophen (NORCO/VICODIN) 5-325 MG tablet Take 1 tablet by mouth every 8 (eight) hours as needed (severe pain in back).  . hydrocortisone cream 1 % Apply 1 application topically 4 (four) times daily as needed for itching.  . polyethylene glycol (MIRALAX / GLYCOLAX) packet Take 17 g by mouth daily as needed.  . rivaroxaban (XARELTO) 20 MG TABS tablet Take 1 tablet (20 mg total) by mouth daily with supper.  . [DISCONTINUED] furosemide (LASIX) 40 MG tablet Take 0.5 tablets (20 mg total) by mouth daily.       Allergies: Allergies  Allergen Reactions  . Demerol [Meperidine] Other (See Comments)    UNSPECIFIED REACTION  Causes system to shutdown. ?   . Oxycodone Hcl Other (See Comments)    Pt states this medication 'wires him up' and makes pt hyper; pt does not want to take this ever again    Social History: The patient  reports that he quit smoking about 9 months ago. His smoking use included Cigarettes. He has a 62.00 pack-year smoking history. He has never used smokeless tobacco. He reports that he does not drink alcohol or use drugs.   Family History: The patient's family history includes Alcohol abuse in his father; Alcoholism in his father; CAD in his father; Colon cancer in his mother; Ovarian cancer in his mother and sister.   Review of Systems: Please see the history of present illness.   Otherwise, the review of systems is positive for none.   All other systems are reviewed and negative.   Physical Exam: VS:  BP (!) 80/40   Pulse 81   Ht _0  (1.803 m)   Wt 158 lb 1.9 oz (71.7 kg)   BMI 22.05 kg/m  .  BMI Body mass index is 22.05 kg/m.  Wt Readings from Last 3 Encounters:  03/12/17 158 lb 1.9 oz (71.7 kg)  03/10/17 161 lb (73 kg)  03/04/17 157 lb (71.2 kg)    General: Elderly, frail and chronically ill appearing. He is alert and in no acute distress. His weight at discharge was 152 on 03/02/2017. Color is sallow.   HEENT: Normal.  Neck: Supple, no JVD, carotid bruits, or masses noted.  Cardiac: Irregular irregular rhythm. His rate is ok. Heart tones are distant.  Over 2+ bilateral edema.  Respiratory:  Lungs with decreased breath sounds but with normal work of breathing at rest. His oxygen sat is 99% by his pulse ox.  GI: Soft and nontender.  MS: No deformity or atrophy. Gait not tested. Skin: Warm and dry. Color is sallow Neuro:  Strength and sensation are intact and no gross focal deficits noted.  Psych: Alert, appropriate and with normal  affect.   LABORATORY DATA:  EKG:  EKG is ordered today. This demonstrates recurrent AF with HR of 79.  Lab Results  Component Value  Date   WBC 8.0 03/02/2017   HGB 10.5 (L) 03/02/2017   HCT 32.1 (L) 03/02/2017   PLT 229 03/02/2017   GLUCOSE 95 03/10/2017   CHOL 115 02/24/2017   TRIG 51 02/24/2017   HDL 39 (L) 02/24/2017   LDLCALC 66 02/24/2017   ALT 16 (L) 02/28/2017   AST 25 02/28/2017   NA 136 03/10/2017   K 4.4 03/10/2017   CL 100 03/10/2017   CREATININE 0.89 03/10/2017   BUN 32 (H) 03/10/2017   CO2 31 03/10/2017   TSH 2.489 02/22/2017   PSA 1.7 11/01/2016   INR 1.44 02/27/2017     BNP (last 3 results)  Recent Labs  11/19/16 0909 11/22/16 0318 02/21/17 1637  BNP 433.1* 678.9* 626.5*    ProBNP (last 3 results) No results for input(s): PROBNP in the last 8760 hours.   Other Studies Reviewed Today:  Echo Study Conclusions 02/2017  - Left ventricle: The cavity size was normal. There was moderate   concentric hypertrophy. Systolic function was normal. The   estimated ejection fraction was in the range of 60% to 65%. Wall   motion was normal; there were no regional wall motion   abnormalities. The study was not technically sufficient to allow   evaluation of LV diastolic dysfunction due to atrial   fibrillation. - Aortic valve: Trileaflet; normal thickness, mildly calcified   leaflets. There was trivial regurgitation. - Mitral valve: Calcified annulus. There was mild regurgitation. - Left atrium: The atrium was mildly dilated. - Right ventricle: The cavity size was moderately dilated. Wall   thickness was normal. - Right atrium: The atrium was massively dilated. - Tricuspid valve: There was moderate regurgitation. - Pulmonic valve: There was trivial regurgitation. - Pulmonary arteries: PA peak pressure: 51 mm Hg (S).   Myoview Study Highlights 07/2016   Nuclear stress EF: 58%.  The study is normal.  The left ventricular ejection fraction is  normal (55-65%).  This is a low risk study.     Assessment/Plan:  1. Acute on chronic diastolic CHF: Most likely related to amyloid. He is now volume overloaded again. BP way too soft. More shortness of breath. Really does not wish to be readmitted.  Was apparently on Midodrine but not now. Have been able to speak to Dr. Aundra Dubin by phone - he has advised restarting Midodrine 5 mg TID. Increasing Lasix to 40 mg BID for 5 days, then 40 mg a day. He will see him in the CHF clinic going forward.   2. Hypoalbuminemia: Likely combination of nephrotic syndrome and decreased oral intake.  -Renal biopsy consistent with amyloid. Oncology now following.  3. Atrial fibrillation: Sinus bradycardia after DCCV. Has reverted back to AF. Rate is controlled. He is on anticoagulation. For now, would leave in AF.   4. Pleural effusions: Bilateral, partially loculated on right.  Had left thoracentesis, fluid appeared to be a transudate but growing GPCs => coag negative Staph, likely contaminant.  Will need to be followed.   5. Nephrotic syndrome: Associate with M-spike on SPEP and UPEP.  Amyloidosis on preliminary results of renal bx. Moderate LVH on echo; likely with cardiac involvement. He may need cardiac MRI. Will defer to oncology and Dr. Aundra Dubin.   6. Electric Burn after DC-CV on chest and back. This seems to be improving.   7. Amyloid-oncology now following. For BMS later this week.   His overall course seems quite tenuous to me. We have arranged follow up in the CHF clinic. Both Mr. &  Mrs. Terpstra were quite appreciative for the plan of care outlined today. Reminded that he may still need to be readmitted if fails to improve.   Current medicines are reviewed with the patient today.  The patient does not have concerns regarding medicines other than what has been noted above.  The following changes have been made:  See above.  Labs/ tests ordered today include:    Orders Placed This Encounter   Procedures  . Basic metabolic panel  . CBC  . EKG 12-Lead     Disposition:   FU with Dr. Aundra Dubin in the CHF clinic.    Patient is agreeable to this plan and will call if any problems develop in the interim.   SignedTruitt Merle, NP  03/12/2017 3:25 PM  [facility] [address] [place], [place]  [address] Phone: [phone] Fax: [phone]

## 2017-03-13 ENCOUNTER — Encounter (HOSPITAL_COMMUNITY): Payer: Self-pay

## 2017-03-13 ENCOUNTER — Telehealth: Payer: Self-pay | Admitting: *Deleted

## 2017-03-13 DIAGNOSIS — Z9181 History of falling: Secondary | ICD-10-CM | POA: Diagnosis not present

## 2017-03-13 DIAGNOSIS — I481 Persistent atrial fibrillation: Secondary | ICD-10-CM | POA: Diagnosis not present

## 2017-03-13 DIAGNOSIS — G8929 Other chronic pain: Secondary | ICD-10-CM | POA: Diagnosis not present

## 2017-03-13 DIAGNOSIS — Z7901 Long term (current) use of anticoagulants: Secondary | ICD-10-CM | POA: Diagnosis not present

## 2017-03-13 DIAGNOSIS — I5042 Chronic combined systolic (congestive) and diastolic (congestive) heart failure: Secondary | ICD-10-CM | POA: Diagnosis not present

## 2017-03-13 DIAGNOSIS — I110 Hypertensive heart disease with heart failure: Secondary | ICD-10-CM | POA: Diagnosis not present

## 2017-03-13 DIAGNOSIS — R918 Other nonspecific abnormal finding of lung field: Secondary | ICD-10-CM | POA: Diagnosis not present

## 2017-03-13 DIAGNOSIS — I739 Peripheral vascular disease, unspecified: Secondary | ICD-10-CM | POA: Diagnosis not present

## 2017-03-13 DIAGNOSIS — E43 Unspecified severe protein-calorie malnutrition: Secondary | ICD-10-CM | POA: Diagnosis not present

## 2017-03-13 DIAGNOSIS — Z9981 Dependence on supplemental oxygen: Secondary | ICD-10-CM | POA: Diagnosis not present

## 2017-03-13 DIAGNOSIS — M50922 Unspecified cervical disc disorder at C5-C6 level: Secondary | ICD-10-CM | POA: Diagnosis not present

## 2017-03-13 DIAGNOSIS — M1612 Unilateral primary osteoarthritis, left hip: Secondary | ICD-10-CM | POA: Diagnosis not present

## 2017-03-13 DIAGNOSIS — D649 Anemia, unspecified: Secondary | ICD-10-CM | POA: Diagnosis not present

## 2017-03-13 DIAGNOSIS — J439 Emphysema, unspecified: Secondary | ICD-10-CM | POA: Diagnosis not present

## 2017-03-13 DIAGNOSIS — J9611 Chronic respiratory failure with hypoxia: Secondary | ICD-10-CM | POA: Diagnosis not present

## 2017-03-13 DIAGNOSIS — Z87891 Personal history of nicotine dependence: Secondary | ICD-10-CM | POA: Diagnosis not present

## 2017-03-13 DIAGNOSIS — Z79891 Long term (current) use of opiate analgesic: Secondary | ICD-10-CM | POA: Diagnosis not present

## 2017-03-13 DIAGNOSIS — I272 Pulmonary hypertension, unspecified: Secondary | ICD-10-CM | POA: Diagnosis not present

## 2017-03-13 LAB — BASIC METABOLIC PANEL
BUN/Creatinine Ratio: 28 — ABNORMAL HIGH (ref 10–24)
BUN: 30 mg/dL — ABNORMAL HIGH (ref 8–27)
CO2: 25 mmol/L (ref 20–29)
Calcium: 7.9 mg/dL — ABNORMAL LOW (ref 8.6–10.2)
Chloride: 99 mmol/L (ref 96–106)
Creatinine, Ser: 1.06 mg/dL (ref 0.76–1.27)
GFR calc Af Amer: 80 mL/min/{1.73_m2} (ref >59)
GFR calc non Af Amer: 69 mL/min/{1.73_m2} (ref >59)
Glucose: 87 mg/dL (ref 65–99)
Potassium: 4.6 mmol/L (ref 3.5–5.2)
Sodium: 137 mmol/L (ref 134–144)

## 2017-03-13 LAB — CBC
Hematocrit: 30.2 % — ABNORMAL LOW (ref 37.5–51.0)
Hemoglobin: 9.7 g/dL — ABNORMAL LOW (ref 13.0–17.7)
MCH: 29.8 pg (ref 26.6–33.0)
MCHC: 32.1 g/dL (ref 31.5–35.7)
MCV: 93 fL (ref 79–97)
Platelets: 477 10*3/uL — ABNORMAL HIGH (ref 150–379)
RBC: 3.25 x10E6/uL — ABNORMAL LOW (ref 4.14–5.80)
RDW: 15.5 % — ABNORMAL HIGH (ref 12.3–15.4)
WBC: 8 10*3/uL (ref 3.4–10.8)

## 2017-03-13 NOTE — Telephone Encounter (Signed)
Pt's wife [name] called asking if pt needs to hold Xarelto prior BMBX tomorrow.  Per Dr. Irene Limbo, no medical necessity to hold blood thinner prior to procedure.  [name] verbalized understanding/thankful for information.

## 2017-03-13 NOTE — Progress Notes (Signed)
appt scheduled 11/5

## 2017-03-14 ENCOUNTER — Telehealth: Payer: Self-pay | Admitting: Nurse Practitioner

## 2017-03-14 ENCOUNTER — Encounter (HOSPITAL_COMMUNITY): Payer: Self-pay

## 2017-03-14 ENCOUNTER — Ambulatory Visit (HOSPITAL_COMMUNITY)
Admission: RE | Admit: 2017-03-14 | Discharge: 2017-03-14 | Disposition: A | Discharge status: Home / Self Care | Payer: Medicare Other | Source: Ambulatory Visit | Attending: Radiology | Admitting: Radiology

## 2017-03-14 ENCOUNTER — Ambulatory Visit (HOSPITAL_COMMUNITY)
Admission: RE | Admit: 2017-03-14 | Discharge: 2017-03-14 | Disposition: A | Discharge status: Home / Self Care | Payer: Medicare Other | Source: Ambulatory Visit | Attending: Hematology | Admitting: Hematology

## 2017-03-14 DIAGNOSIS — Z800 Family history of malignant neoplasm of digestive organs: Secondary | ICD-10-CM | POA: Diagnosis not present

## 2017-03-14 DIAGNOSIS — N049 Nephrotic syndrome with unspecified morphologic changes: Secondary | ICD-10-CM | POA: Insufficient documentation

## 2017-03-14 DIAGNOSIS — Z87891 Personal history of nicotine dependence: Secondary | ICD-10-CM | POA: Diagnosis not present

## 2017-03-14 DIAGNOSIS — Z8546 Personal history of malignant neoplasm of prostate: Secondary | ICD-10-CM | POA: Insufficient documentation

## 2017-03-14 DIAGNOSIS — M1990 Unspecified osteoarthritis, unspecified site: Secondary | ICD-10-CM | POA: Insufficient documentation

## 2017-03-14 DIAGNOSIS — Z811 Family history of alcohol abuse and dependence: Secondary | ICD-10-CM | POA: Diagnosis not present

## 2017-03-14 DIAGNOSIS — F329 Major depressive disorder, single episode, unspecified: Secondary | ICD-10-CM | POA: Diagnosis not present

## 2017-03-14 DIAGNOSIS — Z8041 Family history of malignant neoplasm of ovary: Secondary | ICD-10-CM | POA: Diagnosis not present

## 2017-03-14 DIAGNOSIS — J449 Chronic obstructive pulmonary disease, unspecified: Secondary | ICD-10-CM | POA: Diagnosis not present

## 2017-03-14 DIAGNOSIS — I481 Persistent atrial fibrillation: Secondary | ICD-10-CM | POA: Insufficient documentation

## 2017-03-14 DIAGNOSIS — I5032 Chronic diastolic (congestive) heart failure: Secondary | ICD-10-CM | POA: Insufficient documentation

## 2017-03-14 DIAGNOSIS — F419 Anxiety disorder, unspecified: Secondary | ICD-10-CM | POA: Diagnosis not present

## 2017-03-14 DIAGNOSIS — D7589 Other specified diseases of blood and blood-forming organs: Secondary | ICD-10-CM | POA: Diagnosis not present

## 2017-03-14 DIAGNOSIS — Z85828 Personal history of other malignant neoplasm of skin: Secondary | ICD-10-CM | POA: Diagnosis not present

## 2017-03-14 DIAGNOSIS — Z7901 Long term (current) use of anticoagulants: Secondary | ICD-10-CM | POA: Insufficient documentation

## 2017-03-14 DIAGNOSIS — Z79899 Other long term (current) drug therapy: Secondary | ICD-10-CM | POA: Diagnosis not present

## 2017-03-14 DIAGNOSIS — Z8249 Family history of ischemic heart disease and other diseases of the circulatory system: Secondary | ICD-10-CM | POA: Diagnosis not present

## 2017-03-14 DIAGNOSIS — E8581 Light chain (AL) amyloidosis: Secondary | ICD-10-CM | POA: Diagnosis not present

## 2017-03-14 DIAGNOSIS — R590 Localized enlarged lymph nodes: Secondary | ICD-10-CM

## 2017-03-14 DIAGNOSIS — E8809 Other disorders of plasma-protein metabolism, not elsewhere classified: Secondary | ICD-10-CM | POA: Diagnosis not present

## 2017-03-14 DIAGNOSIS — D649 Anemia, unspecified: Secondary | ICD-10-CM | POA: Diagnosis not present

## 2017-03-14 DIAGNOSIS — Z885 Allergy status to narcotic agent status: Secondary | ICD-10-CM | POA: Insufficient documentation

## 2017-03-14 DIAGNOSIS — C8519 Unspecified B-cell lymphoma, extranodal and solid organ sites: Secondary | ICD-10-CM | POA: Diagnosis not present

## 2017-03-14 LAB — CBC WITH DIFFERENTIAL/PLATELET
BASOS PCT: 1 %
Basophils Absolute: 0.1 10*3/uL (ref 0.0–0.1)
EOS ABS: 0.1 10*3/uL (ref 0.0–0.7)
Eosinophils Relative: 1 %
HEMATOCRIT: 34.3 % — AB (ref 39.0–52.0)
Hemoglobin: 11.3 g/dL — ABNORMAL LOW (ref 13.0–17.0)
Lymphocytes Relative: 23 %
Lymphs Abs: 2.2 10*3/uL (ref 0.7–4.0)
MCH: 30.6 pg (ref 26.0–34.0)
MCHC: 32.9 g/dL (ref 30.0–36.0)
MCV: 93 fL (ref 78.0–100.0)
MONO ABS: 1.5 10*3/uL — AB (ref 0.1–1.0)
MONOS PCT: 15 %
Neutro Abs: 6 10*3/uL (ref 1.7–7.7)
Neutrophils Relative %: 62 %
Platelets: 444 10*3/uL — ABNORMAL HIGH (ref 150–400)
RBC: 3.69 MIL/uL — ABNORMAL LOW (ref 4.22–5.81)
RDW: 15.5 % (ref 11.5–15.5)
WBC: 9.7 10*3/uL (ref 4.0–10.5)

## 2017-03-14 LAB — PROTIME-INR
INR: 1.62
PROTHROMBIN TIME: 19.1 s — AB (ref 11.4–15.2)

## 2017-03-14 MED ORDER — MIDAZOLAM HCL 2 MG/2ML IJ SOLN
INTRAMUSCULAR | Status: AC | PRN
  Administered 2017-03-14 (×2): 0.5 mg via INTRAVENOUS

## 2017-03-14 MED ORDER — FENTANYL CITRATE (PF) 100 MCG/2ML IJ SOLN
INTRAMUSCULAR | Status: AC
  Filled 2017-03-14: qty 4

## 2017-03-14 MED ORDER — FENTANYL CITRATE (PF) 100 MCG/2ML IJ SOLN
INTRAMUSCULAR | Status: AC | PRN
  Administered 2017-03-14 (×3): 12.5 ug via INTRAVENOUS

## 2017-03-14 MED ORDER — SODIUM CHLORIDE 0.9 % IV SOLN
INTRAVENOUS | Status: DC
  Administered 2017-03-14: 07:00:00 via INTRAVENOUS

## 2017-03-14 MED ORDER — MIDAZOLAM HCL 2 MG/2ML IJ SOLN
INTRAMUSCULAR | Status: AC
  Filled 2017-03-14: qty 6

## 2017-03-14 NOTE — Procedures (Signed)
CT guided bone marrow biopsy. 2 aspirates and 2 cores. Minimal blood loss and no immediate complication.   

## 2017-03-14 NOTE — Telephone Encounter (Signed)
New message   Pt wife verbalized that the laxis was increased and that the pharmacy has only a half pill every other day   And she is afraid that pt will run out,  She is asking for Cecille Rubin to call pharmacy for different instructions to be updated for pill pick up

## 2017-03-14 NOTE — H&P (Signed)
Chief Complaint: Patient was seen in consultation today for plasma cell dyscrasia  Referring Physician(s): Dr. Irene Limbo  Supervising Physician: Markus Daft  Patient Status: [facility] - Out-pt  History of Present Illness: [name] is a 71 y.o. male with past medical history of anxiety, arthritis, COPD, CHF, amyloid, nephrotic syndrome who is known to radiology from recent renal biopsy. He is also being evaluated by Oncology/Hematology for plasma cell dyscrasia.  IR consulted for bone marrow biopsy at the request of Dr. Irene Limbo.   Patient presents today in his usual state of health.  He has been NPO.  He last took Xarelto yesterday.   Past Medical History:  Diagnosis Date  . Anxiety   . Arthritis    "neck" (08/15/2015)  . Chronic bronchitis ([place])   . Chronic diastolic CHF (congestive heart failure) ([place])   . Constipation   . COPD (chronic obstructive pulmonary disease) ([place])   . DDD (degenerative disc disease), cervical   . Depression   . Dilated aortic root ([place])    70m on echo 05/2016  . Edema of left lower extremity   . Facial basal cell cancer   . H/O acne vulgaris 1960s   [facility]  . Headache    history of - left temporal- years ago- not current (08/15/2015)  . Persistent atrial fibrillation (HCC)    on coumadin with CHADS2VASC score of 2  . Pneumonia 1960s; 2015 X 2  . Prostate cancer ([facility] dx'd early 2000s   "low spreading; non aggressive type" (08/15/2015)  . Pulmonary HTN ([facility]    PASP 480mg by echo 05/2016  . Skin cancer    "back"    Past Surgical History:  Procedure Laterality Date  . CARDIOVERSION N/A 10/11/2016   Procedure: CARDIOVERSION;  Surgeon: CrSanda KleinMD;  [facility];  Service: Cardiovascular;  Laterality: N/A;  . CARDIOVERSION N/A 02/26/2017   Procedure: CARDIOVERSION;  Surgeon: McLarey DresserMD;  [facility];  Service: Cardiovascular;  Laterality: N/A;  . COLONOSCOPY    .  EXCISIONAL HEMORRHOIDECTOMY  1960s  . INGUINAL HERNIA REPAIR Right 2002  . INGUINAL HERNIA REPAIR Bilateral 08/15/2015  . INGUINAL HERNIA REPAIR Bilateral 08/15/2015   Procedure: OPEN REPAIR RECURRENT RIGHT INGUINAL HERNIA WITH MESH AND REPAIR LEFT INGUINAL HERNIA WITH MESH;  Surgeon: HaFanny SkatesMD;  [facility] Service: General;  Laterality: Bilateral;  . INSERTION OF MESH Bilateral 08/15/2015   Procedure: INSERTION OF MESH;  Surgeon: HaFanny SkatesMD;  Location: [facility] Service: General;  Laterality: Bilateral;  . IR THORACENTESIS ASP PLEURAL SPACE W/IMG GUIDE  11/18/2016  . LYMPH NODE BIOPSY Bilateral 10/22/2016   Procedure: LEFT NECK LYMPH NODE BIOPSY;  Surgeon: VaIvin PootMD;  Location: [facility] Service: Thoracic;  Laterality: Bilateral;  . PROSTATE BIOPSY    . TONSILLECTOMY  1950s  . VIDEO ASSISTED THORACOSCOPY (VATS)/EMPYEMA Right 05/20/2016   Procedure: VIDEO ASSISTED THORACOSCOPY (VATS) with drainage of pleural effusion;  Surgeon: PeIvin PootMD;  [facility] Service: Thoracic;  Laterality: Right;  . Marland KitchenIDEO BRONCHOSCOPY WITH ENDOBRONCHIAL ULTRASOUND Right 05/20/2016   Procedure: VIDEO BRONCHOSCOPY WITH ENDOBRONCHIAL ULTRASOUND;  Surgeon: PeIvin PootMD;  [place]: Thoracic;  Laterality: Right;    Allergies: Demerol [meperidine] and Oxycodone hcl  Medications: Prior to Admission medications   Medication Sig Start Date End Date Taking? Authorizing Provider  feeding supplement (BOOST / RESOURCE  BREEZE) LIQD Take 1 Container by mouth 3 (three) times daily between meals. 03/02/17  Yes Kayleen Memos, DO  furosemide (LASIX) 40 MG tablet Take 1 tablet (40 mg total) by mouth 2 (two) times daily. 40 mg BID for 5 days then 40 mg a day 03/12/17  Yes Burtis Junes, NP  HYDROcodone-acetaminophen (NORCO/VICODIN) 5-325 MG tablet Take 1 tablet by mouth every 8 (eight) hours as needed (severe pain in back). 03/10/17  Yes Nche, Charlene Brooke, NP    hydrocortisone cream 1 % Apply 1 application topically 4 (four) times daily as needed for itching. 03/02/17  Yes Hall, Carole N, DO  midodrine (PROAMATINE) 5 MG tablet Take 1 tablet (5 mg total) by mouth 3 (three) times daily with meals. 03/12/17  Yes Burtis Junes, NP  polyethylene glycol (MIRALAX / GLYCOLAX) packet Take 17 g by mouth daily as needed. 12/30/16  Yes Nche, Charlene Brooke, NP  rivaroxaban (XARELTO) 20 MG TABS tablet Take 1 tablet (20 mg total) by mouth daily with supper. 01/14/17  Yes Turner, Eber Hong, MD  docusate sodium (COLACE) 100 MG capsule Take 1-3 capsules (100-300 mg total) by mouth daily. 03/04/17   Binnie Rail, MD  gabapentin (NEURONTIN) 300 MG capsule Take 1 capsule (300 mg total) by mouth 2 (two) times daily. 03/10/17   Nche, Charlene Brooke, NP     Family History  Problem Relation Age of Onset  . Colon cancer Mother   . Ovarian cancer Mother   . Alcoholism Father   . CAD Father   . Alcohol abuse Father   . Ovarian cancer Sister   . Diabetes Neg Hx   . Stomach cancer Neg Hx     Social History   Social History  . Marital status: Married    Spouse name: N/A  . Number of children: N/A  . Years of education: N/A   Social History Main Topics  . Smoking status: Former Smoker    Packs/day: 1.00    Years: 62.00    Types: Cigarettes    Quit date: 05/14/2016  . Smokeless tobacco: Never Used  . Alcohol use No  . Drug use: No     Comment: CBD OIL  . Sexual activity: Not Currently    Partners: Female   Other Topics Concern  . None   Social History Narrative  . None    Review of Systems  Constitutional: Negative for fatigue and fever.  Respiratory: Negative for cough and shortness of breath.   Cardiovascular: Negative for chest pain.  Gastrointestinal: Negative for abdominal pain.  Musculoskeletal: Negative for back pain.  Psychiatric/Behavioral: Negative for behavioral problems and confusion.    Vital Signs: BP 93/65   Pulse 86   Temp 97.8 F  (36.6 C) (Oral)   Resp 18   Ht _0  (1.803 m)   Wt 158 lb (71.7 kg)   SpO2 94%   BMI 22.04 kg/m   Physical Exam  Constitutional: He is oriented to person, place, and time. He appears well-developed.  Cardiovascular: Normal rate, regular rhythm and normal heart sounds.   Pulmonary/Chest: Effort normal and breath sounds normal. No respiratory distress.  Neurological: He is alert and oriented to person, place, and time.  Skin: Skin is warm and dry.  Psychiatric: He has a normal mood and affect. His behavior is normal. Judgment and thought content normal.  Nursing note and vitals reviewed.   Imaging: Dg Chest 1 View  Result Date: 02/22/2017 CLINICAL DATA:  Status post  LEFT thoracentesis EXAM: CHEST 1 VIEW COMPARISON:  Chest radiograph 02/21/2017 FINDINGS: Interval reduction of LEFT pleural fluid volume. Small effusion remains on the LEFT. No LEFT pneumothorax. Small loculated RIGHT pleural effusion additionally. IMPRESSION: No pneumothorax following LEFT thoracentesis. Reduction in pleural fluid on the LEFT. Electronically Signed   By: Suzy Bouchard M.D.   On: 02/22/2017 16:10   Dg Chest 2 View  Result Date: 02/25/2017 CLINICAL DATA:  Shortness of breath, back pain. EXAM: CHEST  2 VIEW COMPARISON:  02/22/2017 FINDINGS: There is small to moderate right pleural effusion. Cardiomegaly with diffuse interstitial and likely early alveolar opacities, right greater than left. Favor edema/ CHF. Slight interval worsening since prior study. No acute bony abnormality. IMPRESSION: Worsening small to moderate right pleural effusion and diffuse interstitial and early alveolar opacities, likely edema/CHF. Electronically Signed   By: Rolm Baptise M.D.   On: 02/25/2017 09:50   Dg Chest 2 View  Result Date: 02/21/2017 CLINICAL DATA:  Left upper extremity swelling for 3 months. Worsening fatigue. Dyspnea. EXAM: CHEST  2 VIEW COMPARISON:  12/11/2016 FINDINGS: Moderate pleural effusions bilaterally,  enlarged. Diffuse interstitial thickening or fluid, worsened. Moderate cardiomegaly, unchanged. Basilar lung opacity adjacent to the pleural fluid collections, left greater than right, atelectasis versus infectious infiltrate. IMPRESSION: Enlarging bilateral effusions, left greater than right. Diffuse interstitial fluid or thickening, worsened. Electronically Signed   By: Andreas Newport M.D.   On: 02/21/2017 18:07   Ct Angio Chest Pe W Or Wo Contrast  Result Date: 02/22/2017 CLINICAL DATA:  Shortness of breath. Bilateral pleural effusions. History of prostate carcinoma. EXAM: CT ANGIOGRAPHY CHEST WITH CONTRAST TECHNIQUE: Multidetector CT imaging of the chest was performed using the standard protocol during bolus administration of intravenous contrast. Multiplanar CT image reconstructions and MIPs were obtained to evaluate the vascular anatomy. CONTRAST:  100 mL Isovue 370 IV COMPARISON:  CT of the chest without contrast on 11/18/2016 and PET scan on 12/12/2016 FINDINGS: Cardiovascular: The pulmonary arteries are densely opacified with contrast. There is no evidence of pulmonary embolism. No pericardial fluid. There is some reflux of contrast into the IVC and hepatic veins consistent with right heart failure. Mediastinum/Nodes: No enlarged mediastinal, hilar, or axillary lymph nodes. Thyroid gland, trachea, and esophagus demonstrate no significant findings. Lungs/Pleura: Bilateral pleural effusions again noted. A smaller right pleural effusion appears stable compared to prior imaging. There is some enlargement of left pleural fluid volume which is now moderate. Lungs show evidence of stable emphysema and probable congestive heart failure. No focal airspace consolidation or pneumothorax identified. Upper Abdomen: No acute abnormality. Musculoskeletal: No chest wall abnormality. No acute or significant osseous findings. Review of the MIP images confirms the above findings. IMPRESSION: 1. No evidence of acute  pulmonary embolism. 2. Evidence of congestive heart failure and right heart failure. Chronic bilateral pleural effusions again noted with some enlargement of the left pleural effusion compared to prior studies. There is a smaller right pleural effusion present which appears relatively stable in volume. 3. Stable emphysematous lung disease. Emphysema (ICD10-J43.9). Electronically Signed   By: Aletta Edouard M.D.   On: 02/22/2017 14:09   Dg Chest Bilateral Decubitus  Result Date: 02/25/2017 CLINICAL DATA:  Shortness of breath. EXAM: CHEST - BILATERAL DECUBITUS VIEW COMPARISON:  Chest x-ray today. FINDINGS: Moderate right pleural effusion which appears to remain present laterally on the left decubitus view suggesting loculation. No layering left pleural effusion. No pneumothorax. IMPRESSION: Moderate right pleural effusion, likely at least partially loculated laterally. Electronically Signed   By:  Rolm Baptise M.D.   On: 02/25/2017 09:52   Dg Bone Survey Met  Result Date: 03/02/2017 CLINICAL DATA:  Plasma cell dyscrasia EXAM: METASTATIC BONE SURVEY COMPARISON:  PET-CT 12/12/2016, 10/31/2016 FINDINGS: Multiple x-rays of the axial and appendicular skeleton were performed. No aggressive lytic or sclerotic osseous lesion. Moderate right pleural effusion. Bilateral interstitial thickening. Stable cardiomegaly. No acute fracture or dislocation. Degenerative disc disease with mild disc height loss at C3-4, C4-5 and C5-6. S-shaped scoliosis of the thoracolumbar spine. Mild diffuse thoracolumbar spine spondylosis. Mild osteoarthritis of the left hip. Peripheral vascular atherosclerotic disease. IMPRESSION: 1. No aggressive lytic or sclerotic bone lesion of the axial and appendicular skeleton. Electronically Signed   By: Kathreen Devoid   On: 03/02/2017 12:47   US Biopsy (kidney)  Result Date: 02/27/2017 INDICATION: 71 year old male with proteinuria. EXAM: ULTRASOUND GUIDED RENAL BIOPSY COMPARISON:  None.  MEDICATIONS: Fentanyl 100 mcg IV; Versed 2 mg IV ANESTHESIA/SEDATION: Total Moderate Sedation time 10 minutes COMPLICATIONS: None immediate PROCEDURE: Informed written consent was obtained from the patient after a discussion of the risks, benefits and alternatives to treatment. The patient understands and consents the procedure. A timeout was performed prior to the initiation of the procedure. Ultrasound scanning was performed of the bilateral flanks. The inferior pole of the right kidney was selected for biopsy due to location and sonographic window. The procedure was planned. The operative site was prepped and draped in the usual sterile fashion. The overlying soft tissues were anesthetized with 1% lidocaine with epinephrine. A 17 gauge core needle biopsy device was advanced into the inferior cortex of the right kidney and 2 core biopsies were obtained under direct ultrasound guidance. Images were saved for documentation purposes. The biopsy device was removed and hemostasis was obtained with manual compression. Post procedural scanning was negative for significant post procedural hemorrhage or additional complication. A dressing was placed. The patient tolerated the procedure well without immediate post procedural complication. IMPRESSION: Technically successful ultrasound guided right renal biopsy. Electronically Signed   By: Jacqulynn Cadet M.D.   On: 02/27/2017 14:33   US Thoracentesis Asp Pleural Space W/img Guide  Result Date: 02/23/2017 INDICATION: Hit with history of CHF and anasarca, now with pleural effusion. Request is made for diagnostic and therapeutic thoracentesis on the left. EXAM: ULTRASOUND GUIDED DIAGNOSTIC AND THERAPEUTIC LEFT THORACENTESIS MEDICATIONS: 10 mL 1% lidocaine COMPLICATIONS: None immediate. PROCEDURE: An ultrasound guided thoracentesis was thoroughly discussed with the patient and questions answered. The benefits, risks, alternatives and complications were also discussed. The  patient understands and wishes to proceed with the procedure. Written consent was obtained. Ultrasound was performed to localize and mark an adequate pocket of fluid in the left chest. The area was then prepped and draped in the normal sterile fashion. 1% Lidocaine was used for local anesthesia. Under ultrasound guidance a Safe-T-Centesis catheter was introduced. Thoracentesis was performed. The catheter was removed and a dressing applied. FINDINGS: A total of approximately 1.2 liters of pale yellow fluid was removed. Samples were sent to the laboratory as requested by the clinical team. Limited ultrasound of the chest on the right shows loculated pleural fluid. Note patient did undergo a right VATS procedure on 05/20/2016. IMPRESSION: Successful ultrasound guided diagnostic and therapeutic left thoracentesis yielding 1.2 liters of pleural fluid. Electronically Signed   By: Aletta Edouard M.D.   On: 02/22/2017 16:16    Labs:  CBC:  Recent Labs  03/01/17 0442 03/02/17 0640 03/12/17 1533 03/14/17 0720  WBC 8.9 8.0 8.0 9.7  HGB 10.2* 10.5* 9.7* 11.3*  HCT 32.2* 32.1* 30.2* 34.3*  PLT 204 229 477* 444*    COAGS:  Recent Labs  10/17/16 1225 11/18/16 1241 11/18/16 1830 02/26/17 1517 02/27/17 0216 02/27/17 1001 03/14/17 0720  INR 1.34 3.61 3.68  --  1.44  --  1.62  APTT 34  --   --  35 37* 34  --     BMP:  Recent Labs  02/28/17 1115 03/01/17 0442 03/02/17 0640 03/10/17 1241 03/12/17 1533  NA 136 134* 137 136 137  K 3.6 3.6 3.6 4.4 4.6  CL 97* 97* 99* 100 99  CO2 32 31 33* 31 25  GLUCOSE 88 93 91 95 87  BUN 31* 35* 33* 32* 30*  CALCIUM 8.4* 7.9* 8.1* 8.3* 7.9*  CREATININE 0.94 0.90 0.97 0.89 1.06  GFRNONAA >60 >60 >60  --  69  GFRAA >60 >60 >60  --  80    LIVER FUNCTION TESTS:  Recent Labs  02/22/17 1217 02/23/17 0445 02/26/17 0449 02/27/17 0216 02/28/17 1115  BILITOT 0.4 0.5  --  0.6 0.5  AST 22 20  --  21 25  ALT 14* 14*  --  14* 16*  ALKPHOS 183* 179*   --  202* 222*  PROT 5.5* 5.3*  --  5.3* 6.5  ALBUMIN 1.6* 1.6* 1.7* 1.7* 1.9*    TUMOR MARKERS: No results for input(s): AFPTM, CEA, CA199, CHROMGRNA in the last 8760 hours.  Assessment and Plan: Patient with past medical history of CHF and nephrotic syndrome presents with complaint of plasma cell dyscrasia.  IR consulted for bone marrow biopsy at the request of Dr. Irene Limbo. Patient presents today in their usual state of health.  He has been NPO.  Risks and benefits discussed with the patient including, but not limited to bleeding, infection, damage to adjacent structures or low yield requiring additional tests. All of the patient's questions were answered, patient is agreeable to proceed. Consent signed and in chart.   Thank you for this interesting consult.  I greatly enjoyed meeting AADITH RAUDENBUSH and look forward to participating in their care.  A copy of this report was sent to the requesting provider on this date.  Electronically Signed: Docia Barrier, PA 03/14/2017, 8:53 AM   I spent a total of    15 Minutes in face to face in clinical consultation, greater than 50% of which was counseling/coordinating care for plasma cell dyscrasia.

## 2017-03-14 NOTE — Discharge Instructions (Signed)

## 2017-03-14 NOTE — Telephone Encounter (Signed)
Spoke with patient's wife regarding the Lasix medication dosage.  She was confused about the medication distribution.  Once I reviewed it with her, she verbalized understanding.

## 2017-03-17 ENCOUNTER — Ambulatory Visit (HOSPITAL_COMMUNITY)
Admission: RE | Admit: 2017-03-17 | Discharge: 2017-03-17 | Disposition: A | Discharge status: Home / Self Care | Payer: Medicare Other | Source: Ambulatory Visit | Attending: Cardiology | Admitting: Cardiology

## 2017-03-17 ENCOUNTER — Encounter (HOSPITAL_COMMUNITY): Payer: Self-pay | Admitting: Cardiology

## 2017-03-17 VITALS — BP 90/62 | HR 65 | Wt 160.8 lb

## 2017-03-17 DIAGNOSIS — I481 Persistent atrial fibrillation: Secondary | ICD-10-CM | POA: Diagnosis not present

## 2017-03-17 DIAGNOSIS — J9611 Chronic respiratory failure with hypoxia: Secondary | ICD-10-CM | POA: Diagnosis not present

## 2017-03-17 DIAGNOSIS — D649 Anemia, unspecified: Secondary | ICD-10-CM | POA: Diagnosis not present

## 2017-03-17 DIAGNOSIS — I739 Peripheral vascular disease, unspecified: Secondary | ICD-10-CM | POA: Diagnosis not present

## 2017-03-17 DIAGNOSIS — Z7901 Long term (current) use of anticoagulants: Secondary | ICD-10-CM | POA: Diagnosis not present

## 2017-03-17 DIAGNOSIS — R918 Other nonspecific abnormal finding of lung field: Secondary | ICD-10-CM | POA: Diagnosis not present

## 2017-03-17 DIAGNOSIS — I482 Chronic atrial fibrillation: Secondary | ICD-10-CM | POA: Insufficient documentation

## 2017-03-17 DIAGNOSIS — E8809 Other disorders of plasma-protein metabolism, not elsewhere classified: Secondary | ICD-10-CM | POA: Diagnosis not present

## 2017-03-17 DIAGNOSIS — G8929 Other chronic pain: Secondary | ICD-10-CM | POA: Diagnosis not present

## 2017-03-17 DIAGNOSIS — Z79899 Other long term (current) drug therapy: Secondary | ICD-10-CM | POA: Diagnosis not present

## 2017-03-17 DIAGNOSIS — I272 Pulmonary hypertension, unspecified: Secondary | ICD-10-CM | POA: Diagnosis not present

## 2017-03-17 DIAGNOSIS — Z9181 History of falling: Secondary | ICD-10-CM | POA: Diagnosis not present

## 2017-03-17 DIAGNOSIS — I5032 Chronic diastolic (congestive) heart failure: Secondary | ICD-10-CM

## 2017-03-17 DIAGNOSIS — Z8546 Personal history of malignant neoplasm of prostate: Secondary | ICD-10-CM | POA: Diagnosis not present

## 2017-03-17 DIAGNOSIS — R54 Age-related physical debility: Secondary | ICD-10-CM | POA: Insufficient documentation

## 2017-03-17 DIAGNOSIS — I5042 Chronic combined systolic (congestive) and diastolic (congestive) heart failure: Secondary | ICD-10-CM | POA: Diagnosis not present

## 2017-03-17 DIAGNOSIS — I951 Orthostatic hypotension: Secondary | ICD-10-CM | POA: Diagnosis not present

## 2017-03-17 DIAGNOSIS — R630 Anorexia: Secondary | ICD-10-CM | POA: Insufficient documentation

## 2017-03-17 DIAGNOSIS — I110 Hypertensive heart disease with heart failure: Secondary | ICD-10-CM | POA: Diagnosis not present

## 2017-03-17 DIAGNOSIS — J439 Emphysema, unspecified: Secondary | ICD-10-CM | POA: Diagnosis not present

## 2017-03-17 DIAGNOSIS — Z9981 Dependence on supplemental oxygen: Secondary | ICD-10-CM | POA: Diagnosis not present

## 2017-03-17 DIAGNOSIS — Z8249 Family history of ischemic heart disease and other diseases of the circulatory system: Secondary | ICD-10-CM | POA: Insufficient documentation

## 2017-03-17 DIAGNOSIS — N049 Nephrotic syndrome with unspecified morphologic changes: Secondary | ICD-10-CM | POA: Diagnosis not present

## 2017-03-17 DIAGNOSIS — I4819 Other persistent atrial fibrillation: Secondary | ICD-10-CM

## 2017-03-17 DIAGNOSIS — E43 Unspecified severe protein-calorie malnutrition: Secondary | ICD-10-CM | POA: Diagnosis not present

## 2017-03-17 DIAGNOSIS — M1612 Unilateral primary osteoarthritis, left hip: Secondary | ICD-10-CM | POA: Diagnosis not present

## 2017-03-17 DIAGNOSIS — Z79891 Long term (current) use of opiate analgesic: Secondary | ICD-10-CM | POA: Diagnosis not present

## 2017-03-17 DIAGNOSIS — M50922 Unspecified cervical disc disorder at C5-C6 level: Secondary | ICD-10-CM | POA: Diagnosis not present

## 2017-03-17 DIAGNOSIS — Z87891 Personal history of nicotine dependence: Secondary | ICD-10-CM | POA: Insufficient documentation

## 2017-03-17 LAB — BASIC METABOLIC PANEL WITH GFR
Anion gap: 7 (ref 5–15)
BUN: 26 mg/dL — ABNORMAL HIGH (ref 6–20)
CO2: 30 mmol/L (ref 22–32)
Calcium: 8.2 mg/dL — ABNORMAL LOW (ref 8.9–10.3)
Chloride: 96 mmol/L — ABNORMAL LOW (ref 101–111)
Creatinine, Ser: 1.25 mg/dL — ABNORMAL HIGH (ref 0.61–1.24)
GFR calc Af Amer: >60 mL/min
GFR calc non Af Amer: 55 mL/min — ABNORMAL LOW
Glucose, Bld: 116 mg/dL — ABNORMAL HIGH (ref 65–99)
Potassium: 3.8 mmol/L (ref 3.5–5.1)
Sodium: 133 mmol/L — ABNORMAL LOW (ref 135–145)

## 2017-03-17 NOTE — Progress Notes (Signed)
PCP: Primary HF Cardiologist: Dr Aundra Dubin  Hematologist:  Dr Irene Limbo   HPI:  71 yo with persistent atrial fibrillation, chronic diastolic CHF with anasarca, neprotic syndrome, weight loss, pleural effusions, and hypoalbuminemia.  Admitted 10/12 /18 with respiratory failure and volume overload. Hospital course complicated by A fib, nephrotic syndrome, pleural effusion, and newly diagnosed AL amyloidosis. Hematology consulted.  On 03/14/2017 he had bone marrow biopsy.    Today he returns for post hospital follow up. Overall feeling fair. Says he fatigues quickly. Having pain all over with sensitive skin, back pain, and muscular pain. Difficulty sleeping. Wakes up every night with excruciating back pain. Able to to take a few steps. Had a fall Saturday night. Denies syncope. He hit his head but denies headache/visual disturbance. Sits most of the day.  Appetite poor. SBP at home 90s. Weight at home 159 pounds.   Labs (10/18): K 4.6, creatinine 1.06, ESR 121, IgM monoclonal protein.   ECG (personally reviewed): atrial fibrillation rate 79, inferior and anterior TWIs  PMH:  1. Atrial fibrillation: Persistent.  DCCV in 10/18 but atrial fibrillation recurred.  2. Chronic Diastolic Heart Failure: Echo (10/18) with moderate LVH, EF 60-65%, moderately dilated RV with normal systolic function, mild MR, moderate TR.  - Suspect cardiac amyloidosis involvement.  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  3. Nephrotic Syndrome: Secondary to AL amyloidosis.  4. 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  5. 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  Diagnosed by renal biopsy, [place] red positive. Manifested as nephrotic syndrome, CHF, and autonomic neuropathy.   6. Waldenstrom's macroglobulinemia suggested by bone marrow biopsy 11/18.  7. H/o prostate cancer.   ROS: All systems negative except as listed in HPI, PMH and Problem  List.  SH:  Social History   Socioeconomic History  . Marital status: Married    Spouse name: Not on file  . Number of children: Not on file  . Years of education: Not on file  . Highest education level: Not on file  Social Needs  . Financial resource strain: Not on file  . Food insecurity - worry: Not on file  . Food insecurity - inability: Not on file  . Transportation needs - medical: Not on file  . Transportation needs - non-medical: Not on file  Occupational History  . Not on file  Tobacco Use  . Smoking status: Former Smoker    Packs/day: 1.00    Years: 62.00    Pack years: 62.00    Types: Cigarettes    Last attempt to quit: 05/14/2016    Years since quitting: 0.8  . Smokeless tobacco: Never Used  Substance and Sexual Activity  . Alcohol use: No    Alcohol/week: 0.0 oz  . Drug use: No    Comment: CBD OIL  . Sexual activity: Not Currently    Partners: Female  Other Topics Concern  . Not on file  Social History Narrative  . Not on file    FH:  Family History  Problem Relation Age of Onset  . Colon cancer Mother   . Ovarian cancer Mother   . Alcoholism Father   . CAD Father   . Alcohol abuse Father   . Ovarian cancer Sister   . Diabetes Neg Hx   . Stomach cancer Neg Hx     Current Outpatient Medications  Medication Sig Dispense Refill  . docusate sodium (COLACE) 100 MG capsule Take 1-3 capsules (100-300 mg total)  by mouth daily. 90 capsule 5  . feeding supplement (BOOST / RESOURCE BREEZE) LIQD Take 1 Container by mouth 3 (three) times daily between meals. 5 Container 0  . furosemide (LASIX) 40 MG tablet Take 1 tablet (40 mg total) by mouth 2 (two) times daily. 40 mg BID for 5 days then 40 mg a day 30 tablet 6  . gabapentin (NEURONTIN) 300 MG capsule Take 1 capsule (300 mg total) by mouth 2 (two) times daily. 60 capsule 0  . HYDROcodone-acetaminophen (NORCO/VICODIN) 5-325 MG tablet Take 1 tablet by mouth every 8 (eight) hours as needed (severe pain in  back). 30 tablet 0  . hydrocortisone cream 1 % Apply 1 application topically 4 (four) times daily as needed for itching. 30 g 0  . midodrine (PROAMATINE) 5 MG tablet Take 1 tablet (5 mg total) by mouth 3 (three) times daily with meals. 90 tablet 3  . polyethylene glycol (MIRALAX / GLYCOLAX) packet Take 17 g by mouth daily as needed. 14 each 0  . rivaroxaban (XARELTO) 20 MG TABS tablet Take 1 tablet (20 mg total) by mouth daily with supper. 30 tablet 6   No current facility-administered medications for this encounter.     Vitals:   03/17/17 1609  BP: 90/62  Pulse: 65  SpO2: 99%  Weight: 160 lb 12.8 oz (72.9 kg)    PHYSICAL EXAM: General: Elderly. Frail.  Appears fatigued. No resp difficulty. Arrived in a wheel chair. HEENT: normal Neck: supple. JVP  Carotids 2+ bilaterally; no bruits. No lymphadenopathy or thryomegaly appreciated. Cor: PMI normal. Irregular rate & rhythm. No rubs, gallops or murmurs. Lungs: clear Abdomen: soft, nontender, nondistended. No hepatosplenomegaly. No bruits or masses. Good bowel sounds. Extremities: no cyanosis, clubbing, rash, R and LLE 2+ edema Neuro: alert & orientedx3, cranial nerves grossly intact. Moves all 4 extremities w/o difficulty. Affect pleasant.   ASSESSMENT & PLAN: 1. Chronic Diastolic Heart Failure NYHA III. Volume status elevated. Continue lasix 40 mg twice a day.  2. Chronic A fib Had successful DC-CV 02/26/2017 but now back in A fib.  Rate controlled today. Back in A fib. Continue xarelto  3. Amyloidosis AL- BM BX results pending. Has follow up with oncology this week.  4. Hypotension- continue midodrine. 5. Nephrotic Proteinuria -Renal bx + for AL amyloid protein with congo red staining.  -Check BMET today.   6. Fall- mechanical. Does not appear to be from ortho stasis. Continue midodrine.  7. Hypoalbuminemia 8. Back Pain  Check BMET today. Darrick Grinder NP-C   Patient seen with NP, agree with the above note.  I made  changes to the note to reflect my thoughts.   1. Chronic diastolic CHF: Patient has been volume overloaded but diuresis has been difficult due to low BP and hypoalbuminemia.  He has hypoalbuminemia.  He has been diagnosed with AL amyloidosis, this has caused nephrotic syndrome and likely has also involved the heart with diastolic CHF.  - Continue midodrine 5 mg tid.  - I will have him keep Lasix at 40 mg bid.  - BMET today. - Wear compression stockings.   2. Hypoalbuminemia: With 3rd spacing. Due to nephrotic syndrome from renal involvement by amyloidosis.     3. Anorexia: Encouraged protein supplementation.  4. Atrial fibrillation: Persistent.  He is back in atrial fibrillation after DCCV in 10/18.  Given good rate control and no change in symptoms when he was in NSR, we will continue rate control/anticoagulation strategy.   - Continue Xarelto.  5. Nephrotic syndrome: Suspect due to AL amyloidosis.  6. Orthostatic hypotension: Suspect from autonomic neuropathy from amyloidosis. He is on midodrine.  7. Bone marrow biopsy suggestive of Waldenstrom's macroglobulinemia which may underly the amyloidosis.  He has an appointment with hematology.  8. Frailty: Will be doing home PT.    [name] 03/19/2017

## 2017-03-17 NOTE — Patient Instructions (Signed)
Labs drawn today (if we do not call you, then your lab work was stable)   Your physician recommends that you schedule a follow-up appointment in: 3 weeks

## 2017-03-18 ENCOUNTER — Other Ambulatory Visit: Payer: Self-pay

## 2017-03-18 ENCOUNTER — Encounter (HOSPITAL_COMMUNITY): Payer: Self-pay

## 2017-03-18 ENCOUNTER — Ambulatory Visit: Payer: Medicare Other | Admitting: Cardiology

## 2017-03-18 DIAGNOSIS — E43 Unspecified severe protein-calorie malnutrition: Secondary | ICD-10-CM | POA: Diagnosis not present

## 2017-03-18 DIAGNOSIS — Z9981 Dependence on supplemental oxygen: Secondary | ICD-10-CM | POA: Diagnosis not present

## 2017-03-18 DIAGNOSIS — I5042 Chronic combined systolic (congestive) and diastolic (congestive) heart failure: Secondary | ICD-10-CM | POA: Diagnosis not present

## 2017-03-18 DIAGNOSIS — Z79891 Long term (current) use of opiate analgesic: Secondary | ICD-10-CM | POA: Diagnosis not present

## 2017-03-18 DIAGNOSIS — I739 Peripheral vascular disease, unspecified: Secondary | ICD-10-CM | POA: Diagnosis not present

## 2017-03-18 DIAGNOSIS — Z87891 Personal history of nicotine dependence: Secondary | ICD-10-CM | POA: Diagnosis not present

## 2017-03-18 DIAGNOSIS — J9611 Chronic respiratory failure with hypoxia: Secondary | ICD-10-CM | POA: Diagnosis not present

## 2017-03-18 DIAGNOSIS — M1612 Unilateral primary osteoarthritis, left hip: Secondary | ICD-10-CM | POA: Diagnosis not present

## 2017-03-18 DIAGNOSIS — I481 Persistent atrial fibrillation: Secondary | ICD-10-CM | POA: Diagnosis not present

## 2017-03-18 DIAGNOSIS — G8929 Other chronic pain: Secondary | ICD-10-CM | POA: Diagnosis not present

## 2017-03-18 DIAGNOSIS — Z7901 Long term (current) use of anticoagulants: Secondary | ICD-10-CM | POA: Diagnosis not present

## 2017-03-18 DIAGNOSIS — R600 Localized edema: Secondary | ICD-10-CM

## 2017-03-18 DIAGNOSIS — I272 Pulmonary hypertension, unspecified: Secondary | ICD-10-CM | POA: Diagnosis not present

## 2017-03-18 DIAGNOSIS — Z9181 History of falling: Secondary | ICD-10-CM | POA: Diagnosis not present

## 2017-03-18 DIAGNOSIS — J439 Emphysema, unspecified: Secondary | ICD-10-CM | POA: Diagnosis not present

## 2017-03-18 DIAGNOSIS — M50922 Unspecified cervical disc disorder at C5-C6 level: Secondary | ICD-10-CM | POA: Diagnosis not present

## 2017-03-18 DIAGNOSIS — D649 Anemia, unspecified: Secondary | ICD-10-CM | POA: Diagnosis not present

## 2017-03-18 DIAGNOSIS — I110 Hypertensive heart disease with heart failure: Secondary | ICD-10-CM | POA: Diagnosis not present

## 2017-03-18 DIAGNOSIS — R918 Other nonspecific abnormal finding of lung field: Secondary | ICD-10-CM | POA: Diagnosis not present

## 2017-03-18 MED ORDER — FUROSEMIDE 40 MG PO TABS: 40.0000 mg | ORAL_TABLET | Freq: Two times a day (BID) | ORAL | 6 refills | Status: DC

## 2017-03-19 ENCOUNTER — Encounter: Payer: Self-pay | Admitting: Hematology

## 2017-03-19 ENCOUNTER — Ambulatory Visit (HOSPITAL_BASED_OUTPATIENT_CLINIC_OR_DEPARTMENT_OTHER): Payer: Medicare Other | Admitting: Hematology

## 2017-03-19 ENCOUNTER — Telehealth: Payer: Self-pay | Admitting: Hematology

## 2017-03-19 VITALS — BP 92/50 | HR 72 | Temp 97.7°F | Resp 20 | Ht 71.0 in | Wt 159.9 lb

## 2017-03-19 DIAGNOSIS — M549 Dorsalgia, unspecified: Secondary | ICD-10-CM | POA: Diagnosis not present

## 2017-03-19 DIAGNOSIS — G8929 Other chronic pain: Secondary | ICD-10-CM | POA: Diagnosis not present

## 2017-03-19 DIAGNOSIS — E8581 Light chain (AL) amyloidosis: Secondary | ICD-10-CM | POA: Diagnosis not present

## 2017-03-19 DIAGNOSIS — Z9181 History of falling: Secondary | ICD-10-CM | POA: Diagnosis not present

## 2017-03-19 DIAGNOSIS — Z9981 Dependence on supplemental oxygen: Secondary | ICD-10-CM | POA: Diagnosis not present

## 2017-03-19 DIAGNOSIS — N049 Nephrotic syndrome with unspecified morphologic changes: Secondary | ICD-10-CM

## 2017-03-19 DIAGNOSIS — C8300 Small cell B-cell lymphoma, unspecified site: Secondary | ICD-10-CM

## 2017-03-19 DIAGNOSIS — M1612 Unilateral primary osteoarthritis, left hip: Secondary | ICD-10-CM | POA: Diagnosis not present

## 2017-03-19 DIAGNOSIS — Z7189 Other specified counseling: Secondary | ICD-10-CM

## 2017-03-19 DIAGNOSIS — D472 Monoclonal gammopathy: Secondary | ICD-10-CM | POA: Diagnosis not present

## 2017-03-19 DIAGNOSIS — C880 Waldenstrom macroglobulinemia: Secondary | ICD-10-CM

## 2017-03-19 DIAGNOSIS — M50922 Unspecified cervical disc disorder at C5-C6 level: Secondary | ICD-10-CM | POA: Diagnosis not present

## 2017-03-19 DIAGNOSIS — I739 Peripheral vascular disease, unspecified: Secondary | ICD-10-CM | POA: Diagnosis not present

## 2017-03-19 DIAGNOSIS — I5042 Chronic combined systolic (congestive) and diastolic (congestive) heart failure: Secondary | ICD-10-CM | POA: Diagnosis not present

## 2017-03-19 DIAGNOSIS — Z79891 Long term (current) use of opiate analgesic: Secondary | ICD-10-CM | POA: Diagnosis not present

## 2017-03-19 DIAGNOSIS — J9611 Chronic respiratory failure with hypoxia: Secondary | ICD-10-CM | POA: Diagnosis not present

## 2017-03-19 DIAGNOSIS — I272 Pulmonary hypertension, unspecified: Secondary | ICD-10-CM | POA: Diagnosis not present

## 2017-03-19 DIAGNOSIS — I481 Persistent atrial fibrillation: Secondary | ICD-10-CM | POA: Diagnosis not present

## 2017-03-19 DIAGNOSIS — Z87891 Personal history of nicotine dependence: Secondary | ICD-10-CM | POA: Diagnosis not present

## 2017-03-19 DIAGNOSIS — E43 Unspecified severe protein-calorie malnutrition: Secondary | ICD-10-CM | POA: Diagnosis not present

## 2017-03-19 DIAGNOSIS — Z7901 Long term (current) use of anticoagulants: Secondary | ICD-10-CM | POA: Diagnosis not present

## 2017-03-19 DIAGNOSIS — I110 Hypertensive heart disease with heart failure: Secondary | ICD-10-CM | POA: Diagnosis not present

## 2017-03-19 DIAGNOSIS — R918 Other nonspecific abnormal finding of lung field: Secondary | ICD-10-CM | POA: Diagnosis not present

## 2017-03-19 DIAGNOSIS — J439 Emphysema, unspecified: Secondary | ICD-10-CM | POA: Diagnosis not present

## 2017-03-19 DIAGNOSIS — D649 Anemia, unspecified: Secondary | ICD-10-CM | POA: Diagnosis not present

## 2017-03-19 NOTE — Telephone Encounter (Signed)
Gave avs however waiting to hear back from [facility]

## 2017-03-20 ENCOUNTER — Telehealth: Payer: Self-pay | Admitting: Hematology

## 2017-03-20 DIAGNOSIS — R918 Other nonspecific abnormal finding of lung field: Secondary | ICD-10-CM | POA: Diagnosis not present

## 2017-03-20 DIAGNOSIS — J439 Emphysema, unspecified: Secondary | ICD-10-CM | POA: Diagnosis not present

## 2017-03-20 DIAGNOSIS — Z87891 Personal history of nicotine dependence: Secondary | ICD-10-CM | POA: Diagnosis not present

## 2017-03-20 DIAGNOSIS — G8929 Other chronic pain: Secondary | ICD-10-CM | POA: Diagnosis not present

## 2017-03-20 DIAGNOSIS — M1612 Unilateral primary osteoarthritis, left hip: Secondary | ICD-10-CM | POA: Diagnosis not present

## 2017-03-20 DIAGNOSIS — E43 Unspecified severe protein-calorie malnutrition: Secondary | ICD-10-CM | POA: Diagnosis not present

## 2017-03-20 DIAGNOSIS — I272 Pulmonary hypertension, unspecified: Secondary | ICD-10-CM | POA: Diagnosis not present

## 2017-03-20 DIAGNOSIS — Z7901 Long term (current) use of anticoagulants: Secondary | ICD-10-CM | POA: Diagnosis not present

## 2017-03-20 DIAGNOSIS — I5042 Chronic combined systolic (congestive) and diastolic (congestive) heart failure: Secondary | ICD-10-CM | POA: Diagnosis not present

## 2017-03-20 DIAGNOSIS — M50922 Unspecified cervical disc disorder at C5-C6 level: Secondary | ICD-10-CM | POA: Diagnosis not present

## 2017-03-20 DIAGNOSIS — I110 Hypertensive heart disease with heart failure: Secondary | ICD-10-CM | POA: Diagnosis not present

## 2017-03-20 DIAGNOSIS — Z9981 Dependence on supplemental oxygen: Secondary | ICD-10-CM | POA: Diagnosis not present

## 2017-03-20 DIAGNOSIS — J9611 Chronic respiratory failure with hypoxia: Secondary | ICD-10-CM | POA: Diagnosis not present

## 2017-03-20 DIAGNOSIS — Z9181 History of falling: Secondary | ICD-10-CM | POA: Diagnosis not present

## 2017-03-20 DIAGNOSIS — I481 Persistent atrial fibrillation: Secondary | ICD-10-CM | POA: Diagnosis not present

## 2017-03-20 DIAGNOSIS — D649 Anemia, unspecified: Secondary | ICD-10-CM | POA: Diagnosis not present

## 2017-03-20 DIAGNOSIS — Z79891 Long term (current) use of opiate analgesic: Secondary | ICD-10-CM | POA: Diagnosis not present

## 2017-03-20 DIAGNOSIS — I739 Peripheral vascular disease, unspecified: Secondary | ICD-10-CM | POA: Diagnosis not present

## 2017-03-20 NOTE — Telephone Encounter (Signed)
Spoke with patients wife regarding appts added per 11/7 sch msg.

## 2017-03-21 ENCOUNTER — Telehealth: Payer: Self-pay | Admitting: Nurse Practitioner

## 2017-03-21 NOTE — Telephone Encounter (Signed)
Spouse called stating that patient did not realize that [place] had cut vicodin in half.  States patient takes two vicodin every eight hours.  States patient has to have two vicodin a day.  States patient does have appt with pain management on 11/26.  States patient only has four pills left.  Is requesting additional pills.

## 2017-03-21 NOTE — Telephone Encounter (Signed)
Pt called back to check up on this, pt seeing Dr. Jenny Reichmann 03/26/2017 to transfer care and they going to run out of this med on Sunday. Pt expecting a phone today before 5 pm. Please advise.

## 2017-03-24 ENCOUNTER — Ambulatory Visit: Payer: Medicare Other | Admitting: Nurse Practitioner

## 2017-03-24 ENCOUNTER — Other Ambulatory Visit: Payer: Medicare Other

## 2017-03-24 ENCOUNTER — Other Ambulatory Visit: Payer: Self-pay | Admitting: Medical

## 2017-03-24 DIAGNOSIS — R918 Other nonspecific abnormal finding of lung field: Secondary | ICD-10-CM | POA: Diagnosis not present

## 2017-03-24 DIAGNOSIS — Z7189 Other specified counseling: Secondary | ICD-10-CM | POA: Insufficient documentation

## 2017-03-24 DIAGNOSIS — I739 Peripheral vascular disease, unspecified: Secondary | ICD-10-CM | POA: Diagnosis not present

## 2017-03-24 DIAGNOSIS — J9611 Chronic respiratory failure with hypoxia: Secondary | ICD-10-CM | POA: Diagnosis not present

## 2017-03-24 DIAGNOSIS — G8929 Other chronic pain: Secondary | ICD-10-CM | POA: Diagnosis not present

## 2017-03-24 DIAGNOSIS — C8300 Small cell B-cell lymphoma, unspecified site: Secondary | ICD-10-CM | POA: Insufficient documentation

## 2017-03-24 DIAGNOSIS — I272 Pulmonary hypertension, unspecified: Secondary | ICD-10-CM | POA: Diagnosis not present

## 2017-03-24 DIAGNOSIS — G8928 Other chronic postprocedural pain: Secondary | ICD-10-CM

## 2017-03-24 DIAGNOSIS — Z87891 Personal history of nicotine dependence: Secondary | ICD-10-CM

## 2017-03-24 DIAGNOSIS — M1612 Unilateral primary osteoarthritis, left hip: Secondary | ICD-10-CM | POA: Diagnosis not present

## 2017-03-24 DIAGNOSIS — Z9981 Dependence on supplemental oxygen: Secondary | ICD-10-CM | POA: Diagnosis not present

## 2017-03-24 DIAGNOSIS — M50922 Unspecified cervical disc disorder at C5-C6 level: Secondary | ICD-10-CM | POA: Diagnosis not present

## 2017-03-24 DIAGNOSIS — R52 Pain, unspecified: Secondary | ICD-10-CM

## 2017-03-24 DIAGNOSIS — Z0289 Encounter for other administrative examinations: Secondary | ICD-10-CM

## 2017-03-24 DIAGNOSIS — Z7901 Long term (current) use of anticoagulants: Secondary | ICD-10-CM

## 2017-03-24 DIAGNOSIS — Z9181 History of falling: Secondary | ICD-10-CM

## 2017-03-24 DIAGNOSIS — Z79891 Long term (current) use of opiate analgesic: Secondary | ICD-10-CM

## 2017-03-24 DIAGNOSIS — D649 Anemia, unspecified: Secondary | ICD-10-CM | POA: Diagnosis not present

## 2017-03-24 DIAGNOSIS — I110 Hypertensive heart disease with heart failure: Secondary | ICD-10-CM | POA: Diagnosis not present

## 2017-03-24 DIAGNOSIS — J439 Emphysema, unspecified: Secondary | ICD-10-CM | POA: Diagnosis not present

## 2017-03-24 DIAGNOSIS — I5042 Chronic combined systolic (congestive) and diastolic (congestive) heart failure: Secondary | ICD-10-CM | POA: Diagnosis not present

## 2017-03-24 DIAGNOSIS — E43 Unspecified severe protein-calorie malnutrition: Secondary | ICD-10-CM

## 2017-03-24 DIAGNOSIS — F419 Anxiety disorder, unspecified: Secondary | ICD-10-CM | POA: Diagnosis not present

## 2017-03-24 DIAGNOSIS — Z8546 Personal history of malignant neoplasm of prostate: Secondary | ICD-10-CM

## 2017-03-24 DIAGNOSIS — I481 Persistent atrial fibrillation: Secondary | ICD-10-CM | POA: Diagnosis not present

## 2017-03-24 MED ORDER — HYDROCODONE-ACETAMINOPHEN 5-325 MG PO TABS: 1.0000 | ORAL_TABLET | ORAL | 0 refills | Status: DC | PRN

## 2017-03-24 MED ORDER — ONDANSETRON HCL 8 MG PO TABS: 8.0000 mg | ORAL_TABLET | Freq: Two times a day (BID) | ORAL | 1 refills | Status: DC | PRN

## 2017-03-24 MED ORDER — ALLOPURINOL 100 MG PO TABS: ORAL_TABLET | ORAL | 1 refills | Status: DC

## 2017-03-24 MED ORDER — DEXAMETHASONE 4 MG PO TABS: 8.0000 mg | ORAL_TABLET | Freq: Every day | ORAL | 1 refills | Status: DC

## 2017-03-24 MED ORDER — ALLOPURINOL 300 MG PO TABS: 150.0000 mg | ORAL_TABLET | Freq: Every day | ORAL | 3 refills | Status: DC

## 2017-03-24 MED ORDER — ACYCLOVIR 400 MG PO TABS: 400.0000 mg | ORAL_TABLET | Freq: Every day | ORAL | 3 refills | Status: DC

## 2017-03-24 MED ORDER — PROCHLORPERAZINE MALEATE 10 MG PO TABS: 10.0000 mg | ORAL_TABLET | Freq: Four times a day (QID) | ORAL | 1 refills | Status: DC | PRN

## 2017-03-24 NOTE — Progress Notes (Signed)
START ON PATHWAY REGIMEN - Lymphoma and CLL     A cycle is every 28 days:     Bendamustine      Rituximab   **Always confirm dose/schedule in your pharmacy ordering system**    Patient Characteristics: [place] Macroglobulinemia/Lymphoplasmacytic Lymphoma, Symptomatic, First Line Disease Type: [place] Macroglobulinemia/Lymphoplasmacytic Lymphoma Disease Type: Not Applicable Disease Type: Not Applicable Asymptomatic or Symptomatic<= Symptomatic Line of therapy: First Line Intent of Therapy: Non-Curative / Palliative Intent, Discussed with Patient

## 2017-03-24 NOTE — Progress Notes (Signed)
HEMATOLOGY/ONCOLOGY CLINIC NOTE  Date of Service: .03/19/2017  Patient Care Team:  Nche, Charlene Brooke, NP as PCP - General (Internal Medicine)  Donita Brooks, MD as Attending Physician (Internal Medicine)  Mack Guise. Lacinda Axon, MD as Consulting Physician (Urology)   CHIEF COMPLAINTS/PURPOSE OF CONSULTATION:  AL Amyloidosis  Newly diagnosed lymphoplasmacytic lymphoma   HISTORY OF PRESENTING ILLNESS:  [name] is a wonderful 71 y.o. 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  Patient follows with Dr Alen Blew for his h/o prostate cancer and was found to have borderline lymphadenopathy dating back to January 2018. He had a mediastinal adenopathy that was borderline detected on a CT scan. A repeat CT scan on 05/17/2016 showed persistent enlarged mediastinal lymph nodes and a left internal mammary lymph node. Differential considerations include lymphoproliferative disorder, granulomatous inflammation/infection or reactive adenopathy. left cervical LN Bx in 10/2016 was unrevealing.  Patient has multiple co-morbidities including PAF on Xarelto status post DCCV 0/9/60, diastolic CHF with LVEF 45-40% on echo 05/14/16, COPD with pulmonary hypertension on home O2 at night time s/p right VATS 05/20/16 for empyema. He was admitted with  Worsening fatigue and generalized swelling.  He was noted to have hypoalbuminemia and further w/u demonstrated nephrotic range proteinuria of about 6g per day. Patient's nephrology w/u showed monoclonal paraproteinemia with M spike of 1.1g/dl. He subsequently had a kidney biopsy on 02/27/2017 - which per report --prelim showed AL Amyoidosis. Final pathology pending.  Patient is being diuresed aggresivel.  ECHO 10/13 showed EF of 98-11% Diastolic function could not be evaluated due to Atrial fibrillation.  I discussed the available results in details with the patient and  his wife at bedside.  He has no focal bone pain, Has some anemia. No overt hypercalcemia at this time.   INTERVAL HISTORY:  [name] is here for fu of his newly diagnosed Renal AL Amyloidosis lambda light chain.  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 CBC during that time: plt were at 444k, hgb 11.3 and RBC 3.69. Prothrombin Time was at 19.1 seconds.   He is accompanied today by his wife. He states that he is doing well overall. He notes back pains in the area of right shoulder blade that his wife describes as spasmoidic since January of this year. He has seen a chiropractor and physical therapist for this with minimal results. He states he is taking 2 lasix per day and his leg swelling has remained about the same. He is on midodrine to maintain BP as per his cardiologist.  He notes having neuropathy. He uses a walker occasionally to aid with walking he notes fatigue and back pain are hindering his ability to walk around without assistance.   A significant period of time was spent discussing his pathology results, natural history, treatment options and how his functional status and multiple medical co-morbidities are limiting.  On review of systems, pt reports fatigue, back pain in area of right shoulder blade and denies     MEDICAL HISTORY:      Past Medical History:  Diagnosis Date  . Anxiety   . Arthritis    "neck" (08/15/2015)  . Chronic bronchitis ([place])   . Chronic diastolic CHF (congestive heart failure) ([place])   . Constipation   . COPD (chronic obstructive pulmonary disease) ([place])   .  DDD (degenerative disc disease), cervical   . Depression   . Dilated aortic root ([place])    57m on echo 05/2016  . Edema of left lower extremity   . Facial basal cell cancer   . H/O acne vulgaris 1960s   [facility]  . Headache    history  of - left temporal- years ago- not current (08/15/2015)  . Persistent atrial fibrillation (H[facility])    on coumadin with CHADS2VASC score of 2  . Pneumonia 1960s; 2015 X 2  . [place]state cancer [place] 2000[place]   "low spreading; non aggressive type" (08/15/2015)  . Pulmonary HTN ([place]    PASP 45mg by echo 05/2016  . Skin cancer    "back"   SURGICAL HISTORY:       Past Surgical History:  Procedure Laterality Date  . CARDIOVERSION N/A 10/11/2016   Procedure: CARDIOVERSION; Surgeon: CrSanda KleinMD; [facility]; Service: Cardiovascular; Laterality: N/A;  . CARDIOVERSION N/A 02/26/2017   Procedure: CARDIOVERSION; Surgeon: McLarey DresserMD; [facility]; Service: Cardiovascular; Laterality: N/A;  . COLONOSCOPY    . EXCISIONAL HEMORRHOIDECTOMY  1960s  . INGUINAL HERNIA REPAIR Right 2002  . INGUINAL HERNIA REPAIR Bilateral 08/15/2015  . INGUINAL HERNIA REPAIR Bilateral 08/15/2015   Procedure: OPEN REPAIR RECURRENT RIGHT INGUINAL HERNIA WITH MESH AND REPAIR LEFT INGUINAL HERNIA WITH MESH; Surgeon: HaFanny SkatesMD; [facility]; Laterality: Bilateral;  . INSERTION OF MESH Bilateral 08/15/2015   Procedure: INSERTION OF MESH; Surgeon: HaFanny SkatesMD; [place] [facility] [place]; Laterality: Bilateral;  . IR THORACENTESIS ASP PLEURAL SPACE W/IMG GUIDE  11/18/2016  . LYMPH NODE BIOPSY Bilateral 10/22/2016   Procedure: LEFT NECK LYMPH NODE BIOPSY; Surgeon: VaIvin PootMD; [facility]: Thoracic; Laterality: Bilateral;  . PROSTATE BIOPSY    . TONSILLECTOMY  1950s  . VIDEO ASSISTED THORACOSCOPY (VATS)/EMPYEMA Right 05/20/2016   Procedure: VIDEO ASSISTED THORACOSCOPY (VATS) with drainage of pleural effusion; Surgeon: PeIvin PootMD; [place] [facility]; Laterality: Right;  . Marland KitchenIDEO BRONCHOSCOPY WITH ENDOBRONCHIAL ULTRASOUND Right 05/20/2016   Procedure: VIDEO BRONCHOSCOPY WITH ENDOBRONCHIAL ULTRASOUND; Surgeon: PeIvin PootMD; [place] [facility] Thor[facility]; Laterality: Right;   SOCIAL HISTORY:  Social History        Social History  . Marital status: Married    Spouse name: N/A  . Number of children: N/A  . Years of education: N/A      Occupational History  . Not on file.         Social History Main Topics  . Smoking status: Former Smoker    Packs/day: 1.00    Years: 62.00    Types: Cigarettes    Quit date: 05/14/2016  . Smokeless tobacco: Never Used  . Alcohol use No  . Drug use: No     Comment: CBD OIL  . Sexual activity: Not Currently    Partners: Female       Other Topics Concern  . Not on file      Social History Narrative  . No narrative on file   FAMILY HISTORY:       Family History  Problem Relation Age of Onset  . Colon cancer Mother   . Ovarian cancer Mother   . Alcoholism Father   . CAD Father   . Alcohol abuse Father   . Ovarian cancer Sister   . Diabetes Neg Hx   .  Stomach cancer Neg Hx    ALLERGIES: is allergic to demerol [meperidine] and oxycodone hcl.  MEDICATIONS:   . Current Outpatient Medications:  .  docusate sodium (COLACE) 100 MG capsule, Take 1-3 capsules (100-300 mg total) by mouth daily., Disp: 90 capsule, Rfl: 5 .  feeding supplement (BOOST / RESOURCE BREEZE) LIQD, Take 1 Container by mouth 3 (three) times daily between meals., Disp: 5 Container, Rfl: 0 .  furosemide (LASIX) 40 MG tablet, Take 1 tablet (40 mg total) 2 (two) times daily by mouth. 40 mg BID for 5 days then 40 mg a day, Disp: 30 tablet, Rfl: 6 .  hydrocortisone cream 1 %, Apply 1 application topically 4 (four) times daily as needed for itching., Disp: 30 g, Rfl: 0 .  midodrine (PROAMATINE) 5 MG tablet, Take 1 tablet (5 mg total) by mouth 3 (three) times daily with meals., Disp: 90 tablet, Rfl: 3 .  polyethylene glycol (MIRALAX / GLYCOLAX) packet, Take 17 g by mouth daily as needed., Disp: 14 each, Rfl: 0 .  rivaroxaban (XARELTO) 20 MG TABS tablet, Take 1 tablet (20 mg  total) by mouth daily with supper., Disp: 30 tablet, Rfl: 6 .  allopurinol (ZYLOPRIM) 100 MG tablet, 1 to 3 tablets daily as directed prior to chemotherapy, Disp: 30 tablet, Rfl: 1 .  HYDROcodone-acetaminophen (NORCO/VICODIN) 5-325 MG tablet, Take 1 tablet every 4 (four) hours as needed by mouth (severe pain in back)., Disp: 40 tablet, Rfl: 0  REVIEW OF SYSTEMS:  10 Point review of Systems was done is negative except as noted above.   PHYSICAL EXAMINATION:  ECOG PERFORMANCE STATUS: 2 - Symptomatic, <50% confined to bed  .      Vitals:   03/01/17 0622 03/01/17 0900  BP: 106/68 (!) 90/49  Pulse: 80 83  Resp: 18   Temp: 98 F (36.7 C)   SpO2: 98%         Filed Weights   02/27/17 0354 02/28/17 0524 03/01/17 0622  Weight: 153 lb 14.4 oz (69.8 kg) 150 lb 12.8 oz (68.4 kg) 152 lb 3.2 oz (69 kg)   .Body mass index is 21.23 kg/m.  GENERAL:alert, in no acute distress and comfortable  SKIN: no acute rashes, no significant lesions  EYES: conjunctiva are pink and non-injected, sclera anicteric  OROPHARYNX: MMM, no exudates, no oropharyngeal erythema or ulceration  NECK: supple, no JVD  LYMPH: no palpable lymphadenopathy in the cervical, axillary or inguinal regions  LUNGS: clear to auscultation b/l with normal respiratory effort  HEART: regular rate & rhythm  ABDOMEN: normoactive bowel sounds , non tender, not distended.  Extremity: 2+pitting pedal edema  PSYCH: alert & oriented x 3 with fluent speech  NEURO: no focal motor/sensory deficits   LABORATORY DATA:  I have reviewed the data as listed   . CBC Latest Ref Rng & Units 03/14/2017 03/12/2017 03/02/2017  WBC 4.0 - 10.5 K/uL 9.7 8.0 8.0  Hemoglobin 13.0 - 17.0 g/dL 11.3(L) 9.7(L) 10.5(L)  Hematocrit 39.0 - 52.0 % 34.3(L) 30.2(L) 32.1(L)  Platelets 150 - 400 K/uL 444(H) 477(H) 229    . CMP Latest Ref Rng & Units 03/17/2017 03/12/2017 03/10/2017  Glucose 65 - 99 mg/dL 116(H) 87 95  BUN 6 - 20 mg/dL 26(H) 30(H) 32(H)    Creatinine 0.61 - 1.24 mg/dL 1.25(H) 1.06 0.89  Sodium 135 - 145 mmol/L 133(L) 137 136  Potassium 3.5 - 5.1 mmol/L 3.8 4.6 4.4  Chloride 101 - 111 mmol/L 96(L) 99 100  CO2 22 - 32  mmol/L _0 Calcium 8.9 - 10.3 mg/dL 8.2(L) 7.9(L) 8.3(L)  Total Protein 6.5 - 8.1 g/dL - - -  Total Bilirubin 0.3 - 1.2 mg/dL - - -  Alkaline Phos 38 - 126 U/L - - -  AST 15 - 41 U/L - - -  ALT 17 - 63 U/L - - -   Component     Latest Ref Rng & Units 03/01/2017  IgG (Immunoglobin G), Serum     700 - 1,600 mg/dL 476 (L)  IgA     61 - 437 mg/dL 72  IgM (Immunoglobulin M), Srm     15 - 143 mg/dL 1,757 (H)  Total Protein ELP     6.0 - 8.5 g/dL 4.9 (L)  Albumin SerPl Elph-Mcnc     2.9 - 4.4 g/dL 1.6 (L)  Alpha 1     0.0 - 0.4 g/dL 0.3  Alpha2 Glob SerPl Elph-Mcnc     0.4 - 1.0 g/dL 0.8  B-Globulin SerPl Elph-Mcnc     0.7 - 1.3 g/dL 0.7  Gamma Glob SerPl Elph-Mcnc     0.4 - 1.8 g/dL 1.5  M Protein SerPl Elph-Mcnc     Not Observed g/dL 1.2 (H)  Globulin, Total     2.2 - 3.9 g/dL 3.3  Albumin/Glob SerPl     0.7 - 1.7 0.5 (L)  IFE 1      Comment  Please Note (HCV):      Comment  Kappa free light chain     3.3 - 19.4 mg/L 168.2 (H)  Lamda free light chains     5.7 - 26.3 mg/L 134.6 (H)  Kappa, lamda light chain ratio     0.26 - 1.65 1.25  LDH     98 - 192 U/L 148  Beta-2 Microglobulin     0.6 - 2.4 mg/L 4.0 (H)                 *[place]*  *[facility]*  [address], [place] [address]  [phone]  -------------------------------------------------------------------  Transthoracic Echocardiography  Patient: Hanad, Leino  MR #: [id number]  Study Date: 02/22/2017  Gender: M  Age: 58  Height: 180.3 cm  Weight: 73 kg  BSA: 1.91 m^2  Pt. Status:  Room: [place], [place], [place]  Gillis Santa [id number]  Esau Grew [id number]  PERFORMING [facility],  Inpatient  SONOGRAPHER Jannett Celestine, RDCS  cc:  -------------------------------------------------------------------  LV EF: 60% - 65%  -------------------------------------------------------------------  Indications: CHF - 428.0.  -------------------------------------------------------------------  History: PMH: Pulmonary Hypertension  Atrial fibrillation. Congestive heart failure. Chronic  obstructive pulmonary disease. Risk factors: Former tobacco use.  -------------------------------------------------------------------  Study Conclusions  - Left ventricle: The cavity size was normal. There was moderate  concentric hypertrophy. Systolic function was normal. The  estimated ejection fraction was in the range of 60% to 65%. Wall  motion was normal; there were no regional wall motion  abnormalities. The study was not technically sufficient to allow  evaluation of LV diastolic dysfunction due to atrial  fibrillation.  - Aortic valve: Trileaflet; normal thickness, mildly calcified  leaflets. There was trivial regurgitation.  - Mitral valve: Calcified annulus. There was mild regurgitation.  - Left atrium: The atrium was mildly dilated.  - Right ventricle: The cavity size was moderately dilated. Wall  thickness was normal.  - Right atrium: The atrium was massively dilated.  - Tricuspid valve: There  was moderate regurgitation.  - Pulmonic valve: There was trivial regurgitation.  - Pulmonary arteries: PA peak pressure: 51 mm Hg (S).  RADIOGRAPHIC STUDIES:  I have personally reviewed the radiological images as listed and agreed with the findings in the report.   Imaging Results    Whole body skeletal survey 03/02/2017  IMPRESSION: 1. No aggressive lytic or sclerotic bone lesion of the axial and appendicular skeleton.   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 24h UPEP with 6.22g  Proteinuria daily.  PLAN  -all available results of BM Bx and other labs were discussed in details. -I spent a significant amount of time with the patient and his wife discussing the significance of the diagnosis of Lymphoplasmacytic lymphoma and AL Amyloidosis, natural history, prognosis , workup and treatment options.  -will need continued diuresis to achieve euvolemic status as per cardiology/nephrology/PCP. He is currently also on midodrine to maintain BP. -PET/CT for initial staging of newly diagnosed lymphoplasmacytic lymphoma for staging. -discussed treatment options and treatment tolerance limitation from his multiple other medical co-morbidities. -will pursue Bendamustine (dose reduced)/Rituxan with G-CSF support. --Will give referral to nutritionist to aid with appetite and nutrients -Pt opted to hold off on a port at this time and will consider if needed -F/u with pcp for pain management of chronic back pain - could consider orthopedic evaluation if needed.  3.)Back pain - Defer pain management and further evaluation to PCP.  PLAN PET/CT in 5 days Dietician consultation Ernestene Kiel (in 2-3 days) Chemo-counseling for Bendamustine/Rituxan in 2-3 days Schedule to start Bendamustine/Rituxan on 11/15 and 11/16 Labs with D1 of chemotherapy RTC with Dr Irene Limbo 1 week post chemotherapy with labs for toxicity check    All of the patients questions were answered with apparent satisfaction. The patient knows to call the clinic with any problems, questions or concerns.    I spent 35 minutes counseling the  patient face to face. The total time spent in the appointment was 45 minutes and more than 50% was on counseling and direct patient cares.   Sullivan Lone MD [place] AAHIVMS [facility]  Hematology/Oncology Physician  [facility]  (Office): [phone]  (Work cell): [phone]  (Fax): [phone]   03/01/2017 11:32 AM  This document serves as a record of services personally performed by Sullivan Lone, MD. It was created on his behalf by Alean Rinne, a trained medical scribe. The creation of this record is based on the scribe's personal observations and the provider's statements to them.   .I have reviewed the above documentation for accuracy and completeness, and I agree with the above. Brunetta Genera MD

## 2017-03-25 ENCOUNTER — Telehealth: Payer: Self-pay | Admitting: Nurse Practitioner

## 2017-03-25 ENCOUNTER — Telehealth (HOSPITAL_COMMUNITY): Payer: Self-pay

## 2017-03-25 ENCOUNTER — Ambulatory Visit: Payer: Medicare Other | Admitting: Nutrition

## 2017-03-25 NOTE — Telephone Encounter (Signed)
Spoke with pt's spouse, gave advise. She is aware.

## 2017-03-25 NOTE — Telephone Encounter (Signed)
Patient's wife calling to report patient was weighed at home on telemonitoring yesterday but thinks it was inaccurate.  States he is usually at 157-158 lbs but yesterday it was 151 lbs and today he is back up to 158 lbs, however he is no different in presentation and feels the same. Agreed this was likely a false low weight yesterday and to continue to monitor and stick with strict fluid and sodium restrictions. Wife asking if we should change his diuretic since Dr. Aundra Dubin stated he was fluid overloaded at last week's apt and he has not lost any weight.  Still taking lasix 40 mg twice daily, but does not c/o SOB or swelling and feels stable.  Also wants Dr. Aundra Dubin to know he starts chemo this week I advised the patient's wife to continue their daily med, fluid, and diet regimen as they have been and I will return a call to them only if Dr. Aundra Dubin recommends any further changes at this time. Will forward to Dr. Aundra Dubin to further advise.  Renee Pain, RN

## 2017-03-25 NOTE — Telephone Encounter (Signed)
No changes.

## 2017-03-25 NOTE — Progress Notes (Signed)
Patient is a 71 year old male diagnosed with non-Hodgkin's lymphoma and prostate cancer.  Past medical history includes AL Amyloidosis, atrial fibrillation, depression, COPD, CHF, and anxiety.  Medications include Colace, Lasix, MiraLAX.  Labs include sodium 133, glucose 116, BUN 26, and creatinine 1.25.  Height: 5 feet 11 inches. Weight: 159.9 pounds on November 7. BMI: 22.3.  Patient did not attend nutrition consultation. Patient's wife came alone. She reports she is very frustrated and needs help scheduling patient's meals and snacks. The patient is only consuming smaller portions.   She reports his physician wants him to have more protein.  Nutrition diagnosis:  Food and nutrition related knowledge deficit related to diagnosis of NHL as evidenced by no prior need for nutrition related information.  Intervention: Educated patient's wife to provide patient with small frequent meals and snacks 6 times daily. Encouraged high-protein foods.   Provided fact sheets. Suggested oral nutrition supplements and provided samples and purchasing information. Encouraged weight maintenance (lean body mass)  Monitoring, evaluation, goals: Patient will tolerate increased calories and protein to maintain lean body mass.  Next visit: To be scheduled as needed.  Contact information was provided for questions.  **Disclaimer: This note was dictated with voice recognition software. Similar sounding words can inadvertently be transcribed and this note may contain transcription errors which may not have been corrected upon publication of note.**

## 2017-03-25 NOTE — Telephone Encounter (Signed)
Do they plan on maintaining upcoming appt with Dr. Jenny Reichmann? If there is an acute altered mental status, he should be going to hospital.

## 2017-03-25 NOTE — Telephone Encounter (Signed)
Called reporting a fall on Sunday 11/11 at night, he was walking to the bathroom from his bed and he started shouting for help and started reaching for things that were nor there, and he started to fall and the daughter and wife were not able to regain his balance and led him to the floor. No injury. They were able to get him back up.  The patient states he was reaching for something that was not there(reaching for security)  She recommend him to use his walker and use motion detector lights in the halls  Does have a bed side toilet and she recommends he uses it   Yesterday his BP was 70/45 and he had just taken his vicodin, she got him to eat and drink water and it went up to 82/60 States when he fell before he had just taken his vicodin as well  No symptoms with his low BP, but does believe he is falling from his low BP,   Can call wife if you have any questions

## 2017-03-26 ENCOUNTER — Ambulatory Visit: Payer: Medicare Other | Admitting: Internal Medicine

## 2017-03-26 ENCOUNTER — Ambulatory Visit (INDEPENDENT_AMBULATORY_CARE_PROVIDER_SITE_OTHER): Payer: Medicare Other | Admitting: Internal Medicine

## 2017-03-26 ENCOUNTER — Other Ambulatory Visit: Payer: Self-pay | Admitting: Pharmacist

## 2017-03-26 ENCOUNTER — Other Ambulatory Visit: Payer: Self-pay | Admitting: *Deleted

## 2017-03-26 ENCOUNTER — Telehealth: Payer: Self-pay | Admitting: Hematology

## 2017-03-26 ENCOUNTER — Encounter: Payer: Self-pay | Admitting: Internal Medicine

## 2017-03-26 VITALS — BP 100/62 | HR 63 | Temp 97.6°F | Ht 71.0 in | Wt 156.0 lb

## 2017-03-26 DIAGNOSIS — I739 Peripheral vascular disease, unspecified: Secondary | ICD-10-CM | POA: Diagnosis not present

## 2017-03-26 DIAGNOSIS — E43 Unspecified severe protein-calorie malnutrition: Secondary | ICD-10-CM

## 2017-03-26 DIAGNOSIS — Z9181 History of falling: Secondary | ICD-10-CM | POA: Diagnosis not present

## 2017-03-26 DIAGNOSIS — G4700 Insomnia, unspecified: Secondary | ICD-10-CM

## 2017-03-26 DIAGNOSIS — J9611 Chronic respiratory failure with hypoxia: Secondary | ICD-10-CM | POA: Diagnosis not present

## 2017-03-26 DIAGNOSIS — I272 Pulmonary hypertension, unspecified: Secondary | ICD-10-CM | POA: Diagnosis not present

## 2017-03-26 DIAGNOSIS — M50922 Unspecified cervical disc disorder at C5-C6 level: Secondary | ICD-10-CM | POA: Diagnosis not present

## 2017-03-26 DIAGNOSIS — Z7901 Long term (current) use of anticoagulants: Secondary | ICD-10-CM | POA: Diagnosis not present

## 2017-03-26 DIAGNOSIS — M546 Pain in thoracic spine: Secondary | ICD-10-CM

## 2017-03-26 DIAGNOSIS — M1612 Unilateral primary osteoarthritis, left hip: Secondary | ICD-10-CM | POA: Diagnosis not present

## 2017-03-26 DIAGNOSIS — I5042 Chronic combined systolic (congestive) and diastolic (congestive) heart failure: Secondary | ICD-10-CM | POA: Diagnosis not present

## 2017-03-26 DIAGNOSIS — I110 Hypertensive heart disease with heart failure: Secondary | ICD-10-CM | POA: Diagnosis not present

## 2017-03-26 DIAGNOSIS — I890 Lymphedema, not elsewhere classified: Secondary | ICD-10-CM | POA: Diagnosis not present

## 2017-03-26 DIAGNOSIS — D649 Anemia, unspecified: Secondary | ICD-10-CM | POA: Diagnosis not present

## 2017-03-26 DIAGNOSIS — J439 Emphysema, unspecified: Secondary | ICD-10-CM | POA: Diagnosis not present

## 2017-03-26 DIAGNOSIS — Z9981 Dependence on supplemental oxygen: Secondary | ICD-10-CM | POA: Diagnosis not present

## 2017-03-26 DIAGNOSIS — G8929 Other chronic pain: Secondary | ICD-10-CM | POA: Diagnosis not present

## 2017-03-26 DIAGNOSIS — Z79891 Long term (current) use of opiate analgesic: Secondary | ICD-10-CM | POA: Diagnosis not present

## 2017-03-26 DIAGNOSIS — R918 Other nonspecific abnormal finding of lung field: Secondary | ICD-10-CM | POA: Diagnosis not present

## 2017-03-26 DIAGNOSIS — Z87891 Personal history of nicotine dependence: Secondary | ICD-10-CM | POA: Diagnosis not present

## 2017-03-26 DIAGNOSIS — I481 Persistent atrial fibrillation: Secondary | ICD-10-CM | POA: Diagnosis not present

## 2017-03-26 MED ORDER — MIRTAZAPINE 30 MG PO TABS: 30.0000 mg | ORAL_TABLET | Freq: Every day | ORAL | 3 refills | Status: DC

## 2017-03-26 MED ORDER — HYDROCODONE-ACETAMINOPHEN 10-325 MG PO TABS: 1.0000 | ORAL_TABLET | Freq: Four times a day (QID) | ORAL | 0 refills | Status: DC | PRN

## 2017-03-26 NOTE — Telephone Encounter (Signed)
Scheduled appt per 11/14 sch msg - spoke with patient regarding the appt.

## 2017-03-26 NOTE — Progress Notes (Signed)
error 

## 2017-03-26 NOTE — Patient Instructions (Addendum)
Please take all new medication as prescribed - the higher dose vicodin  You will be contacted regarding the referral for: MRI thoracic spine  You are given the prescription for the compression sleeve  Please take all new medication as prescribed - the remeron for sleep and appetite (may also help with depressoin)  Please continue all other medications as before, and refills have been done if requested.  Please have the pharmacy call with any other refills you may need.  Please keep your appointments with your specialists as you may have planned - Pain Management Nov 26, and Oncology  No further lab work needed today  Please return in 6 months, or sooner if needed

## 2017-03-26 NOTE — Progress Notes (Signed)
Subjective:    Patient ID: [name], male    DOB: [birth year], 71 y.o.   MRN: [id number]  HPI  Here to establish as new pt; has unfortunate complicated PMH and multiple illnesses not the least of which is very recent dx of lymphoma.  Has been hospd x 3 since jan 2018 for illnesses. For Chemo start tomorrow.  Pt has recurring chronic back pain, worst at night, and hypersensitive skin to light tough that causes so much pain.   Pt continues to have recurring right mid thoracic paravertebral pain without change in severity, bowel or bladder change, fever, wt loss,  worsening LE pain/numbness/weakness, but has pain in radicular fashion about right t6 dermatome.  Current pain about 7/10, has been recently been given vicodin 5/325 but has to take 2 for adequate relief.  Incidentally asks for left compression sleeve as suggested per Home Health.  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   Pain usually 5-6/10, takes 2 vicodin qhs prn to get through the night, and usually only o/w takes 2 per daytime. Has appt with pain management nov 26. Past Medical History:  Diagnosis Date  . Anxiety   . Arthritis    "neck" (08/15/2015)  . Chronic bronchitis ([place])   . Chronic diastolic CHF (congestive heart failure) ([place])   . Constipation   . COPD (chronic obstructive pulmonary disease) ([place])   . DDD (degenerative disc disease), cervical   . Depression   . Dilated aortic root ([place])    40m on echo 05/2016  . Edema of left lower extremity   . Facial basal cell cancer   . H/O acne vulgaris 1960s   "[facility]"  . Headache    history of - left temporal- years ago- not current (08/15/2015)  . Persistent atrial fibrillation (H[facility])    on coumadin with CHADS2VASC score of 2  . Pneumonia 1960s; 2015 X 2  . Prostate cancer [place] dx[place] early 2000s   "low spreading; non aggressive type"  (08/15/2015)  . Pulmonary HTN ([place]    PASP 483mg by echo 05/2016  . Skin cancer    "back"   Past Surgical History:  Procedure Laterality Date  . CARDIOVERSION N/A 02/26/2017   [facility] CARDIOVERSION N/A 10/11/2016   [facility]. COLONOSCOPY    . EXCISIONAL HEMORRHOIDECTOMY  1960s  . INGUINAL HERNIA REPAIR Right 2002  . INGUINAL HERNIA REPAIR Bilateral 08/15/2015  . INSERTION OF MESH Bilateral 08/15/2015   [facility] IR THORACENTESIS ASP PLEURAL SPACE W/IMG GUIDE  11/18/2016  . LEFT NECK LYMPH NODE BIOPSY Bilateral 10/22/2016   [facility] OPEN REPAIR RECURRENT RIGHT INGUINAL HERNIA WITH MESH AND REPAIR LEFT INGUINAL HERNIA WITH MESH Bilateral 08/15/2015   [facility]  . TONSILLECTOMY  1950s  . VIDEO ASSISTED THORACOSCOPY (VATS) with drainage of pleural effusion Right 05/20/2016   [facility]. VIDEO BRONCHOSCOPY WITH ENDOBRONCHIAL ULTRASOUND Right 05/20/2016   [facility]formed by Va[facility]cott [facility]Collier Salina[facility]  reports that he quit smoking about 10  months ago. His smoking use included cigarettes. He has a 62.00 pack-year smoking history. he has never used smokeless tobacco. He reports that he does not drink alcohol or use drugs. family history includes Alcohol abuse in his father; Alcoholism in his father; CAD in his father; Colon cancer in his mother; Ovarian cancer in his mother and sister. Allergies  Allergen Reactions  . Demerol [Meperidine] Other (See Comments)    UNSPECIFIED REACTION  Causes system to shutdown. ?   . Oxycodone Hcl Other (See Comments)    Pt states this medication 'wires him up' and makes pt hyper; pt does not want to take this ever again   Current Outpatient Medications on File Prior to Visit  Medication Sig Dispense Refill  . acyclovir  (ZOVIRAX) 400 MG tablet Take 1 tablet (400 mg total) daily by mouth. 30 tablet 3  . allopurinol (ZYLOPRIM) 100 MG tablet 1 to 3 tablets daily as directed prior to chemotherapy 30 tablet 1  . allopurinol (ZYLOPRIM) 300 MG tablet Take 0.5 tablets (150 mg total) daily by mouth. 30 tablet 3  . dexamethasone (DECADRON) 4 MG tablet Take 2 tablets (8 mg total) daily by mouth. Start the day after bendamustine chemotherapy for 2 days. Take with food. 30 tablet 1  . docusate sodium (COLACE) 100 MG capsule Take 1-3 capsules (100-300 mg total) by mouth daily. 90 capsule 5  . feeding supplement (BOOST / RESOURCE BREEZE) LIQD Take 1 Container by mouth 3 (three) times daily between meals. 5 Container 0  . furosemide (LASIX) 40 MG tablet Take 1 tablet (40 mg total) 2 (two) times daily by mouth. 40 mg BID for 5 days then 40 mg a day 30 tablet 6  . hydrocortisone cream 1 % Apply 1 application topically 4 (four) times daily as needed for itching. 30 g 0  . midodrine (PROAMATINE) 5 MG tablet Take 1 tablet (5 mg total) by mouth 3 (three) times daily with meals. 90 tablet 3  . ondansetron (ZOFRAN) 8 MG tablet Take 1 tablet (8 mg total) 2 (two) times daily as needed by mouth for refractory nausea / vomiting. Start on day 2 after bendamustine chemo. 30 tablet 1  . polyethylene glycol (MIRALAX / GLYCOLAX) packet Take 17 g by mouth daily as needed. 14 each 0  . prochlorperazine (COMPAZINE) 10 MG tablet Take 1 tablet (10 mg total) every 6 (six) hours as needed by mouth (Nausea or vomiting). 30 tablet 1  . rivaroxaban (XARELTO) 20 MG TABS tablet Take 1 tablet (20 mg total) by mouth daily with supper. 30 tablet 6   No current facility-administered medications on file prior to visit.    Review of Systems  Constitutional: Negative for other unusual diaphoresis or sweats HENT: Negative for ear discharge or swelling Eyes: Negative for other worsening visual disturbances Respiratory: Negative for stridor or other swelling    Gastrointestinal: Negative for worsening distension or other blood Genitourinary: Negative for retention or other urinary change Musculoskeletal: Negative for other MSK pain or swelling Skin: Negative for color change or other new lesions Neurological: Negative for worsening tremors and other numbness  Psychiatric/Behavioral: Negative for worsening agitation or other fatigue All other system neg per pt    Objective:   Physical Exam BP 100/62   Pulse 63   Temp 97.6 F (36.4 C) (Oral)   Ht _0  (1.803 m)   Wt 156 lb (70.8 kg)   SpO2 98%   BMI 21.76 kg/m  on Home o2 2L qhs but  prn daytime as well VS noted, low normal BMI, chronic ill appearing, examined in wheelchair Constitutional: Pt appears in NAD HENT: Head: NCAT.  Right Ear: External ear normal.  Left Ear: External ear normal.  Eyes: . Pupils are equal, round, and reactive to light. Conjunctivae and EOM are normal Nose: without d/c or deformity Neck: Neck supple. Gross normal ROM Cardiovascular: Normal rate and regular rhythm.   Pulmonary/Chest: Effort normal and breath sounds decreased without rales or wheezing.  Spine; mod tender right about t6 level thoracic paravertebral area without rash or swelling Abd:  Soft, NT, ND Neurological: Pt is alert. At baseline orientation, motor grossly intact Skin: Skin is warm. No rashes, other new lesions, no LE edema Psychiatric: Pt behavior is normal without agitation , + depressed affect No other exam findings     Assessment & Plan:

## 2017-03-27 ENCOUNTER — Other Ambulatory Visit (HOSPITAL_BASED_OUTPATIENT_CLINIC_OR_DEPARTMENT_OTHER): Payer: Medicare Other

## 2017-03-27 ENCOUNTER — Ambulatory Visit (HOSPITAL_BASED_OUTPATIENT_CLINIC_OR_DEPARTMENT_OTHER): Payer: Medicare Other

## 2017-03-27 VITALS — BP 82/55 | HR 75 | Temp 97.9°F | Resp 19

## 2017-03-27 DIAGNOSIS — C8300 Small cell B-cell lymphoma, unspecified site: Secondary | ICD-10-CM | POA: Diagnosis not present

## 2017-03-27 DIAGNOSIS — E8581 Light chain (AL) amyloidosis: Secondary | ICD-10-CM

## 2017-03-27 DIAGNOSIS — Z7189 Other specified counseling: Secondary | ICD-10-CM

## 2017-03-27 DIAGNOSIS — Z5112 Encounter for antineoplastic immunotherapy: Secondary | ICD-10-CM

## 2017-03-27 DIAGNOSIS — Z5111 Encounter for antineoplastic chemotherapy: Secondary | ICD-10-CM

## 2017-03-27 DIAGNOSIS — C880 Waldenstrom macroglobulinemia: Secondary | ICD-10-CM

## 2017-03-27 DIAGNOSIS — J449 Chronic obstructive pulmonary disease, unspecified: Secondary | ICD-10-CM | POA: Diagnosis not present

## 2017-03-27 MED ORDER — ACETAMINOPHEN 325 MG PO TABS
ORAL_TABLET | ORAL | Status: AC
  Filled 2017-03-27: qty 2

## 2017-03-27 MED ORDER — SODIUM CHLORIDE 0.9 % IV SOLN
70.0000 mg/m2 | Freq: Once | INTRAVENOUS | Status: AC
  Administered 2017-03-27: 125 mg via INTRAVENOUS
  Filled 2017-03-27: qty 5

## 2017-03-27 MED ORDER — SODIUM CHLORIDE 0.9 % IV SOLN
375.0000 mg/m2 | Freq: Once | INTRAVENOUS | Status: AC
  Administered 2017-03-27: 700 mg via INTRAVENOUS
  Filled 2017-03-27: qty 50

## 2017-03-27 MED ORDER — DEXAMETHASONE SODIUM PHOSPHATE 10 MG/ML IJ SOLN
INTRAMUSCULAR | Status: AC
  Filled 2017-03-27: qty 1

## 2017-03-27 MED ORDER — PALONOSETRON HCL INJECTION 0.25 MG/5ML
INTRAVENOUS | Status: AC
  Filled 2017-03-27: qty 5

## 2017-03-27 MED ORDER — PALONOSETRON HCL INJECTION 0.25 MG/5ML
0.2500 mg | Freq: Once | INTRAVENOUS | Status: AC
  Administered 2017-03-27: 0.25 mg via INTRAVENOUS

## 2017-03-27 MED ORDER — DEXAMETHASONE SODIUM PHOSPHATE 10 MG/ML IJ SOLN
10.0000 mg | Freq: Once | INTRAMUSCULAR | Status: AC
  Administered 2017-03-27: 10 mg via INTRAVENOUS

## 2017-03-27 MED ORDER — ACETAMINOPHEN 325 MG PO TABS
650.0000 mg | ORAL_TABLET | Freq: Once | ORAL | Status: AC
  Administered 2017-03-27: 650 mg via ORAL

## 2017-03-27 MED ORDER — DIPHENHYDRAMINE HCL 25 MG PO CAPS
50.0000 mg | ORAL_CAPSULE | Freq: Once | ORAL | Status: AC
  Administered 2017-03-27: 50 mg via ORAL

## 2017-03-27 MED ORDER — SODIUM CHLORIDE 0.9 % IV SOLN
Freq: Once | INTRAVENOUS | Status: AC
  Administered 2017-03-27: 10:00:00 via INTRAVENOUS

## 2017-03-27 MED ORDER — DIPHENHYDRAMINE HCL 25 MG PO CAPS
ORAL_CAPSULE | ORAL | Status: AC
  Filled 2017-03-27: qty 2

## 2017-03-27 NOTE — Progress Notes (Signed)
Ok to begin treatment with blood pressure of 80/51 per Dr. Irene Limbo. Pt instructed to take his midodrine when it is time.  Cyndia Bent RN

## 2017-03-27 NOTE — Patient Instructions (Signed)
[name] Discharge Instructions for Patients Receiving Chemotherapy  Today you received the following chemotherapy agents Rituximab, Bendeka.   To help prevent nausea and vomiting after your treatment, we encourage you to take your nausea medication as prescribed.    If you develop nausea and vomiting that is not controlled by your nausea medication, call the clinic.   BELOW ARE SYMPTOMS THAT SHOULD BE REPORTED IMMEDIATELY:  *FEVER GREATER THAN 100.5 F  *CHILLS WITH OR WITHOUT FEVER  NAUSEA AND VOMITING THAT IS NOT CONTROLLED WITH YOUR NAUSEA MEDICATION  *UNUSUAL SHORTNESS OF BREATH  *UNUSUAL BRUISING OR BLEEDING  TENDERNESS IN MOUTH AND THROAT WITH OR WITHOUT PRESENCE OF ULCERS  *URINARY PROBLEMS  *BOWEL PROBLEMS  UNUSUAL RASH Items with * indicate a potential emergency and should be followed up as soon as possible.  Feel free to call the clinic should you have any questions or concerns. The clinic phone number is [phone].  Please show the [place] at check-in to the Emergency Department and triage nurse.   Rituximab injection What is this medicine? RITUXIMAB (ri TUX i mab) is a monoclonal antibody. It is used to treat certain types of cancer like non-Hodgkin lymphoma and chronic lymphocytic leukemia. It is also used to treat rheumatoid arthritis, granulomatosis with polyangiitis (or Wegener's granulomatosis), and microscopic polyangiitis. This medicine may be used for other purposes; ask your health care provider or pharmacist if you have questions. COMMON BRAND NAME(S): Rituxan What should I tell my health care provider before I take this medicine? They need to know if you have any of these conditions: -heart disease -infection (especially a virus infection such as hepatitis B, chickenpox, cold sores, or herpes) -immune system problems -irregular heartbeat -kidney disease -lung or breathing disease, like asthma -recently received or  scheduled to receive a vaccine -an unusual or allergic reaction to rituximab, mouse proteins, other medicines, foods, dyes, or preservatives -pregnant or trying to get pregnant -breast-feeding How should I use this medicine? This medicine is for infusion into a vein. It is administered in a hospital or clinic by a specially trained health care professional. A special MedGuide will be given to you by the pharmacist with each prescription and refill. Be sure to read this information carefully each time. Talk to your pediatrician regarding the use of this medicine in children. This medicine is not approved for use in children. Overdosage: If you think you have taken too much of this medicine contact a poison control center or emergency room at once. NOTE: This medicine is only for you. Do not share this medicine with others. What if I miss a dose? It is important not to miss a dose. Call your doctor or health care professional if you are unable to keep an appointment. What may interact with this medicine? -cisplatin -other medicines for arthritis like disease modifying antirheumatic drugs or tumor necrosis factor inhibitors -live virus vaccines This list may not describe all possible interactions. Give your health care provider a list of all the medicines, herbs, non-prescription drugs, or dietary supplements you use. Also tell them if you smoke, drink alcohol, or use illegal drugs. Some items may interact with your medicine. What should I watch for while using this medicine? Your condition will be monitored carefully while you are receiving this medicine. You may need blood work done while you are taking this medicine. This medicine can cause serious allergic reactions. To reduce your risk you may need to take medicine before treatment with this  medicine. Take your medicine as directed. In some patients, this medicine may cause a serious brain infection that may cause death. If you have any  problems seeing, thinking, speaking, walking, or standing, tell your doctor right away. If you cannot reach your doctor, urgently seek other source of medical care. Call your doctor or health care professional for advice if you get a fever, chills or sore throat, or other symptoms of a cold or flu. Do not treat yourself. This drug decreases your body's ability to fight infections. Try to avoid being around people who are sick. Do not become pregnant while taking this medicine or for 12 months after stopping it. Women should inform their doctor if they wish to become pregnant or think they might be pregnant. There is a potential for serious side effects to an unborn child. Talk to your health care professional or pharmacist for more information. What side effects may I notice from receiving this medicine? Side effects that you should report to your doctor or health care professional as soon as possible: -breathing problems -chest pain -dizziness or feeling faint -fast, irregular heartbeat -low blood counts - this medicine may decrease the number of white blood cells, red blood cells and platelets. You may be at increased risk for infections and bleeding. -mouth sores -redness, blistering, peeling or loosening of the skin, including inside the mouth (this can be added for any serious or exfoliative rash that could lead to hospitalization) -signs of infection - fever or chills, cough, sore throat, pain or difficulty passing urine -signs and symptoms of kidney injury like trouble passing urine or change in the amount of urine -signs and symptoms of liver injury like dark yellow or brown urine; general ill feeling or flu-like symptoms; light-colored stools; loss of appetite; nausea; right upper belly pain; unusually weak or tired; yellowing of the eyes or skin -stomach pain -vomiting Side effects that usually do not require medical attention (report to your doctor or health care professional if they  continue or are bothersome): -headache -joint pain -muscle cramps or muscle pain This list may not describe all possible side effects. Call your doctor for medical advice about side effects. You may report side effects to FDA at 1-800-FDA-1088. Where should I keep my medicine? This drug is given in a hospital or clinic and will not be stored at home. NOTE: This sheet is a summary. It may not cover all possible information. If you have questions about this medicine, talk to your doctor, pharmacist, or health care provider.  2018 Elsevier/Gold Standard (2015-12-06 15:28:09)   Bendamustine Injection What is this medicine? BENDAMUSTINE (BEN da MUS teen) is a chemotherapy drug. It is used to treat chronic lymphocytic leukemia and non-Hodgkin lymphoma. This medicine may be used for other purposes; ask your health care provider or pharmacist if you have questions. COMMON BRAND NAME(S): BENDEKA, Treanda What should I tell my health care provider before I take this medicine? They need to know if you have any of these conditions: -infection (especially a virus infection such as chickenpox, cold sores, or herpes) -kidney disease -liver disease -an unusual or allergic reaction to bendamustine, mannitol, other medicines, foods, dyes, or preservatives -pregnant or trying to get pregnant -breast-feeding How should I use this medicine? This medicine is for infusion into a vein. It is given by a health care professional in a hospital or clinic setting. Talk to your pediatrician regarding the use of this medicine in children. Special care may be needed. Overdosage: If you  think you have taken too much of this medicine contact a poison control center or emergency room at once. NOTE: This medicine is only for you. Do not share this medicine with others. What if I miss a dose? It is important not to miss your dose. Call your doctor or health care professional if you are unable to keep an appointment. What  may interact with this medicine? Do not take this medicine with any of the following medications: -clozapine This medicine may also interact with the following medications: -atazanavir -cimetidine -ciprofloxacin -enoxacin -fluvoxamine -medicines for seizures like carbamazepine and phenobarbital -mexiletine -rifampin -tacrine -thiabendazole -zileuton This list may not describe all possible interactions. Give your health care provider a list of all the medicines, herbs, non-prescription drugs, or dietary supplements you use. Also tell them if you smoke, drink alcohol, or use illegal drugs. Some items may interact with your medicine. What should I watch for while using this medicine? This drug may make you feel generally unwell. This is not uncommon, as chemotherapy can affect healthy cells as well as cancer cells. Report any side effects. Continue your course of treatment even though you feel ill unless your doctor tells you to stop. You may need blood work done while you are taking this medicine. Call your doctor or health care professional for advice if you get a fever, chills or sore throat, or other symptoms of a cold or flu. Do not treat yourself. This drug decreases your body's ability to fight infections. Try to avoid being around people who are sick. This medicine may increase your risk to bruise or bleed. Call your doctor or health care professional if you notice any unusual bleeding. Talk to your doctor about your risk of cancer. You may be more at risk for certain types of cancers if you take this medicine. Do not become pregnant while taking this medicine or for 3 months after stopping it. Women should inform their doctor if they wish to become pregnant or think they might be pregnant. Men should not father a child while taking this medicine and for 3 months after stopping it.There is a potential for serious side effects to an unborn child. Talk to your health care professional or  pharmacist for more information. Do not breast-feed an infant while taking this medicine. This medicine may interfere with the ability to have a child. You should talk with your doctor or health care professional if you are concerned about your fertility. What side effects may I notice from receiving this medicine? Side effects that you should report to your doctor or health care professional as soon as possible: -allergic reactions like skin rash, itching or hives, swelling of the face, lips, or tongue -low blood counts - this medicine may decrease the number of white blood cells, red blood cells and platelets. You may be at increased risk for infections and bleeding. -redness, blistering, peeling or loosening of the skin, including inside the mouth -signs of infection - fever or chills, cough, sore throat, pain or difficulty passing urine -signs of decreased platelets or bleeding - bruising, pinpoint red spots on the skin, black, tarry stools, blood in the urine -signs of decreased red blood cells - unusually weak or tired, fainting spells, lightheadedness -signs and symptoms of kidney injury like trouble passing urine or change in the amount of urine -signs and symptoms of liver injury like dark yellow or brown urine; general ill feeling or flu-like symptoms; light-colored stools; loss of appetite; nausea; right upper belly  pain; unusually weak or tired; yellowing of the eyes or skin Side effects that usually do not require medical attention (report to your doctor or health care professional if they continue or are bothersome): -constipation -decreased appetite -diarrhea -headache -mouth sores -nausea/vomiting -tiredness This list may not describe all possible side effects. Call your doctor for medical advice about side effects. You may report side effects to FDA at 1-800-FDA-1088. Where should I keep my medicine? This drug is given in a hospital or clinic and will not be stored at  home. NOTE: This sheet is a summary. It may not cover all possible information. If you have questions about this medicine, talk to your doctor, pharmacist, or health care provider.  2018 Elsevier/Gold Standard (2015-03-02 08:45:41)

## 2017-03-28 ENCOUNTER — Telehealth: Payer: Self-pay

## 2017-03-28 ENCOUNTER — Telehealth: Payer: Self-pay | Admitting: Internal Medicine

## 2017-03-28 ENCOUNTER — Ambulatory Visit (HOSPITAL_BASED_OUTPATIENT_CLINIC_OR_DEPARTMENT_OTHER): Payer: Medicare Other

## 2017-03-28 ENCOUNTER — Encounter: Payer: Self-pay | Admitting: *Deleted

## 2017-03-28 VITALS — BP 100/78 | HR 71 | Temp 97.6°F | Resp 18

## 2017-03-28 DIAGNOSIS — C8300 Small cell B-cell lymphoma, unspecified site: Secondary | ICD-10-CM

## 2017-03-28 DIAGNOSIS — Z7189 Other specified counseling: Secondary | ICD-10-CM

## 2017-03-28 DIAGNOSIS — Z5111 Encounter for antineoplastic chemotherapy: Secondary | ICD-10-CM

## 2017-03-28 DIAGNOSIS — E8581 Light chain (AL) amyloidosis: Secondary | ICD-10-CM | POA: Diagnosis not present

## 2017-03-28 DIAGNOSIS — C880 Waldenstrom macroglobulinemia: Secondary | ICD-10-CM

## 2017-03-28 LAB — HEPATITIS B SURFACE ANTIBODY,QUALITATIVE: HEP B SURFACE AB, QUAL: NONREACTIVE

## 2017-03-28 LAB — HEPATITIS B CORE ANTIBODY, IGM: HEP B C IGM: NEGATIVE

## 2017-03-28 LAB — HEPATITIS B SURFACE ANTIGEN: HBsAg Screen: NEGATIVE

## 2017-03-28 MED ORDER — SODIUM CHLORIDE 0.9 % IV SOLN
70.0000 mg/m2 | Freq: Once | INTRAVENOUS | Status: AC
  Administered 2017-03-28: 125 mg via INTRAVENOUS
  Filled 2017-03-28: qty 5

## 2017-03-28 MED ORDER — SODIUM CHLORIDE 0.9 % IV SOLN
Freq: Once | INTRAVENOUS | Status: AC
  Administered 2017-03-28: 12:00:00 via INTRAVENOUS

## 2017-03-28 MED ORDER — DEXAMETHASONE SODIUM PHOSPHATE 10 MG/ML IJ SOLN
10.0000 mg | Freq: Once | INTRAMUSCULAR | Status: AC
  Administered 2017-03-28: 10 mg via INTRAVENOUS

## 2017-03-28 MED ORDER — DEXAMETHASONE SODIUM PHOSPHATE 10 MG/ML IJ SOLN
INTRAMUSCULAR | Status: AC
  Filled 2017-03-28: qty 1

## 2017-03-28 MED ORDER — PEGFILGRASTIM 6 MG/0.6ML ~~LOC~~ PSKT
6.0000 mg | PREFILLED_SYRINGE | Freq: Once | SUBCUTANEOUS | Status: AC
  Administered 2017-03-28: 6 mg via SUBCUTANEOUS
  Filled 2017-03-28: qty 0.6

## 2017-03-28 NOTE — Progress Notes (Signed)
[name]  Clinical Social [name] received Associate Professor, patient's wife, [name], interested in transportation resources.  CSW unable to reach patient's wife at phone number provided. CSW left message on patient's voicemail requesting return call.     Maryjean Morn, MSW, LCSW, OSW-C Clinical Social Worker [facility] [phone]

## 2017-03-28 NOTE — Telephone Encounter (Signed)
Wife called that pt missed his mirtazapine last night.  Dr Jenny Reichmann prescribed the mirtazapine for sleep and appetite. Instructed her to wait until tonight to use it. She asked about the other new medications. Instructed her to bring them with her today for his infusion appt and the nurse can help her then.

## 2017-03-28 NOTE — Patient Instructions (Signed)
[place] Discharge Instructions for Patients Receiving Chemotherapy  Today you received the following chemotherapy agents Bendeka.   To help prevent nausea and vomiting after your treatment, we encourage you to take your nausea medication as prescribed.    If you develop nausea and vomiting that is not controlled by your nausea medication, call the clinic.   BELOW ARE SYMPTOMS THAT SHOULD BE REPORTED IMMEDIATELY:  *FEVER GREATER THAN 100.5 F  *CHILLS WITH OR WITHOUT FEVER  NAUSEA AND VOMITING THAT IS NOT CONTROLLED WITH YOUR NAUSEA MEDICATION  *UNUSUAL SHORTNESS OF BREATH  *UNUSUAL BRUISING OR BLEEDING  TENDERNESS IN MOUTH AND THROAT WITH OR WITHOUT PRESENCE OF ULCERS  *URINARY PROBLEMS  *BOWEL PROBLEMS  UNUSUAL RASH Items with * indicate a potential emergency and should be followed up as soon as possible.  Feel free to call the clinic should you have any questions or concerns. The clinic phone number is [phone].  Please show the [place] at check-in to the Emergency Department and triage nurse.

## 2017-03-28 NOTE — Telephone Encounter (Signed)
Noted.  Dr. Denice Bors

## 2017-03-28 NOTE — Telephone Encounter (Signed)
[name] called stating they missed one of their PT visits today, the patient started Chemo and Radiation today

## 2017-03-29 NOTE — Assessment & Plan Note (Addendum)
Etiology unclear, will need MRI t spine to determine, ok for vicodin 10 325 prn, f/u pain management as planned

## 2017-03-29 NOTE — Assessment & Plan Note (Signed)
For remeron as above

## 2017-03-29 NOTE — Assessment & Plan Note (Signed)
Ok for remeron qhs, may help with appetite as well

## 2017-03-29 NOTE — Assessment & Plan Note (Signed)
Ok for compression sleeve,  to f/u any worsening symptoms or concerns

## 2017-03-31 ENCOUNTER — Telehealth: Payer: Self-pay

## 2017-03-31 ENCOUNTER — Telehealth: Payer: Self-pay | Admitting: Internal Medicine

## 2017-03-31 DIAGNOSIS — Z87891 Personal history of nicotine dependence: Secondary | ICD-10-CM | POA: Diagnosis not present

## 2017-03-31 DIAGNOSIS — E43 Unspecified severe protein-calorie malnutrition: Secondary | ICD-10-CM | POA: Diagnosis not present

## 2017-03-31 DIAGNOSIS — I5042 Chronic combined systolic (congestive) and diastolic (congestive) heart failure: Secondary | ICD-10-CM | POA: Diagnosis not present

## 2017-03-31 DIAGNOSIS — I739 Peripheral vascular disease, unspecified: Secondary | ICD-10-CM | POA: Diagnosis not present

## 2017-03-31 DIAGNOSIS — M50922 Unspecified cervical disc disorder at C5-C6 level: Secondary | ICD-10-CM | POA: Diagnosis not present

## 2017-03-31 DIAGNOSIS — J9611 Chronic respiratory failure with hypoxia: Secondary | ICD-10-CM | POA: Diagnosis not present

## 2017-03-31 DIAGNOSIS — R918 Other nonspecific abnormal finding of lung field: Secondary | ICD-10-CM | POA: Diagnosis not present

## 2017-03-31 DIAGNOSIS — I272 Pulmonary hypertension, unspecified: Secondary | ICD-10-CM | POA: Diagnosis not present

## 2017-03-31 DIAGNOSIS — I481 Persistent atrial fibrillation: Secondary | ICD-10-CM | POA: Diagnosis not present

## 2017-03-31 DIAGNOSIS — I110 Hypertensive heart disease with heart failure: Secondary | ICD-10-CM | POA: Diagnosis not present

## 2017-03-31 DIAGNOSIS — J439 Emphysema, unspecified: Secondary | ICD-10-CM | POA: Diagnosis not present

## 2017-03-31 DIAGNOSIS — Z9181 History of falling: Secondary | ICD-10-CM | POA: Diagnosis not present

## 2017-03-31 DIAGNOSIS — Z79891 Long term (current) use of opiate analgesic: Secondary | ICD-10-CM | POA: Diagnosis not present

## 2017-03-31 DIAGNOSIS — Z9981 Dependence on supplemental oxygen: Secondary | ICD-10-CM | POA: Diagnosis not present

## 2017-03-31 DIAGNOSIS — Z7901 Long term (current) use of anticoagulants: Secondary | ICD-10-CM | POA: Diagnosis not present

## 2017-03-31 DIAGNOSIS — D649 Anemia, unspecified: Secondary | ICD-10-CM | POA: Diagnosis not present

## 2017-03-31 DIAGNOSIS — M1612 Unilateral primary osteoarthritis, left hip: Secondary | ICD-10-CM | POA: Diagnosis not present

## 2017-03-31 DIAGNOSIS — G8929 Other chronic pain: Secondary | ICD-10-CM | POA: Diagnosis not present

## 2017-03-31 MED ORDER — HYDROCODONE-ACETAMINOPHEN 10-325 MG PO TABS: 1.0000 | ORAL_TABLET | Freq: Four times a day (QID) | ORAL | 0 refills | Status: DC | PRN

## 2017-03-31 NOTE — Progress Notes (Signed)
HEMATOLOGY/ONCOLOGY CLINIC NOTE  Date of Service: .03/19/2017  Patient Care Team:  Nche, Charlene Brooke, NP as PCP - General (Internal Medicine)  Donita Brooks, MD as Attending Physician (Internal Medicine)  Mack Guise. Lacinda Axon, MD as Consulting Physician (Urology)   CHIEF COMPLAINTS/PURPOSE OF CONSULTATION:  AL Amyloidosis  Newly diagnosed lymphoplasmacytic lymphoma   HISTORY OF PRESENTING ILLNESS:  [name] is a wonderful 71 y.o. 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  Patient follows with Dr Alen Blew for his h/o prostate cancer and was found to have borderline lymphadenopathy dating back to January 2018. He had a mediastinal adenopathy that was borderline detected on a CT scan. A repeat CT scan on 05/17/2016 showed persistent enlarged mediastinal lymph nodes and a left internal mammary lymph node. Differential considerations include lymphoproliferative disorder, granulomatous inflammation/infection or reactive adenopathy. left cervical LN Bx in 10/2016 was unrevealing.  Patient has multiple co-morbidities including PAF on Xarelto status post DCCV 12/15/44, diastolic CHF with LVEF 27-03% on echo 05/14/16, COPD with pulmonary hypertension on home O2 at night time s/p right VATS 05/20/16 for empyema. He was admitted with  Worsening fatigue and generalized swelling.  He was noted to have hypoalbuminemia and further w/u demonstrated nephrotic range proteinuria of about 6g per day. Patient's nephrology w/u showed monoclonal paraproteinemia with M spike of 1.1g/dl. He subsequently had a kidney biopsy on 02/27/2017 - which per report --prelim showed AL Amyoidosis. Final pathology pending.  Patient is being diuresed aggresivel.  ECHO 10/13 showed EF of 50-09% Diastolic function could not be evaluated due to Atrial fibrillation.  I discussed the available results in details with the patient and  his wife at bedside.  He has no focal bone pain, Has some anemia. No overt hypercalcemia at this time.   INTERVAL HISTORY:  [name] is here for fu of his newly diagnosed Renal AL Amyloidosis lambda light chain accompanied by his wife. His RBC is 3.77, hgb 11.2 today 04/02/2017.  He is not doing very well overall. He states he has been painfully constipated with hard stools and started taking dulcolax for this. His wife states edema has not improved despite taking water pill twice daily. He notes MR thoracic spine was ordered by Dr. Cathlean Cower on 03/26/17 for his severe back pain.  No overt prohibitive toxicity from the Bendamustine/Rituxan treatment at this time. Has continued to have low BP and has been on midodrine as per his cardiologist. On review of systems, pt reports constipation, severe back pain and denies diarrhea, vomiting, nausea, fever, chills, and any other accompanying symptoms.     MEDICAL HISTORY:      Past Medical History:  Diagnosis Date  . Anxiety   . Arthritis    "neck" (08/15/2015)  . Chronic bronchitis ([place])   . Chronic diastolic CHF (congestive heart failure) ([place])   . Constipation   . COPD (chronic obstructive pulmonary disease) ([place])   . DDD (degenerative disc disease), cervical   . Depression   . Dilated aortic root ([place])    52m on echo 05/2016  . Edema of left lower extremity   . Facial basal cell cancer   . H/O acne vulgaris 1960s   [facility]  . Headache    history of - left temporal- years ago- not current (08/15/2015)  . Persistent atrial fibrillation ([place]  on coumadin with CHADS2VASC score of 2  . Pneumonia 1960s; 2015 X 2  . Prostate cancer ([place]) dx'd early 2000s   "low spreading; non aggressive type" (08/15/2015)  . Pulmonary HTN ([place])    PASP 61mHg by echo 05/2016  . Skin cancer    "back"   SURGICAL HISTORY:       Past Surgical History:  Procedure Laterality Date  . CARDIOVERSION N/A  10/11/2016   Procedure: CARDIOVERSION; Surgeon: CSanda Klein MD; [facility]; Service: Cardiovascular; Laterality: N/A;  . CARDIOVERSION N/A 02/26/2017   Procedure: CARDIOVERSION; Surgeon: MLarey Dresser MD; [facility]; Service: Cardiovascular; Laterality: N/A;  . COLONOSCOPY    . EXCISIONAL HEMORRHOIDECTOMY  1960s  . INGUINAL HERNIA REPAIR Right 2002  . INGUINAL HERNIA REPAIR Bilateral 08/15/2015  . INGUINAL HERNIA REPAIR Bilateral 08/15/2015   Procedure: OPEN REPAIR RECURRENT RIGHT INGUINAL HERNIA WITH MESH AND REPAIR LEFT INGUINAL HERNIA WITH MESH; Surgeon: HFanny Skates MD; [place] [facility] Service: General; Laterality: Bilateral;  . INSERTION OF MESH Bilateral 08/15/2015   Procedure: INSERTION OF MESH; Surgeon: HFanny Skates MD; [place] [facility] Service: General; Laterality: Bilateral;  . IR THORACENTESIS ASP PLEURAL SPACE W/IMG GUIDE  11/18/2016  . LYMPH NODE BIOPSY Bilateral 10/22/2016   Procedure: LEFT NECK LYMPH NODE BIOPSY; Surgeon: VIvin Poot MD; [place] Service: Thoracic; Laterality: Bilateral;  . PROSTATE BIOPSY    . TONSILLECTOMY  1950s  . VIDEO ASSISTED THORACOSCOPY (VATS)/EMPYEMA Right 05/20/2016   Procedure: VIDEO ASSISTED THORACOSCOPY (VATS) with drainage of pleural effusion; Surgeon: PIvin Poot MD; [place] [facility] Service: Thoracic; Laterality: Right;  .Marland KitchenVIDEO BRONCHOSCOPY WITH ENDOBRONCHIAL ULTRASOUND Right 05/20/2016   Procedure: VIDEO BRONCHOSCOPY WITH ENDOBRONCHIAL ULTRASOUND; Surgeon: PIvin Poot MD; [place] [facility] Service: Thoracic; Laterality: Right;   SOCIAL HISTORY:  Social History        Social History  . Marital status: Married    Spouse name: N/A  . Number of children: N/A  . Years of education: N/A      Occupational History  . Not on file.         Social History Main Topics  . Smoking status: Former Smoker    Packs/day: 1.00    Years: 62.00    Types: Cigarettes    Quit date: 05/14/2016  .  Smokeless tobacco: Never Used  . Alcohol use No  . Drug use: No     Comment: CBD OIL  . Sexual activity: Not Currently    Partners: Female       Other Topics Concern  . Not on file      Social History Narrative  . No narrative on file   FAMILY HISTORY:       Family History  Problem Relation Age of Onset  . Colon cancer Mother   . Ovarian cancer Mother   . Alcoholism Father   . CAD Father   . Alcohol abuse Father   . Ovarian cancer Sister   . Diabetes Neg Hx   . Stomach cancer Neg Hx    ALLERGIES: is allergic to demerol [meperidine] and oxycodone hcl.  MEDICATIONS:   . Current Outpatient Medications:  .  acyclovir (ZOVIRAX) 400 MG tablet, Take 1 tablet (400 mg total) daily by mouth., Disp: 30 tablet, Rfl: 3 .  allopurinol (ZYLOPRIM) 100 MG tablet, 1 to 3 tablets daily as directed prior to chemotherapy, Disp: 30 tablet, Rfl: 1 .  allopurinol (ZYLOPRIM) 300 MG tablet, Take 0.5 tablets (150 mg total) daily  by mouth., Disp: 30 tablet, Rfl: 3 .  dexamethasone (DECADRON) 4 MG tablet, Take 2 tablets (8 mg total) daily by mouth. Start the day after bendamustine chemotherapy for 2 days. Take with food., Disp: 30 tablet, Rfl: 1 .  docusate sodium (COLACE) 100 MG capsule, Take 1-3 capsules (100-300 mg total) by mouth daily., Disp: 90 capsule, Rfl: 5 .  feeding supplement (BOOST / RESOURCE BREEZE) LIQD, Take 1 Container by mouth 3 (three) times daily between meals., Disp: 5 Container, Rfl: 0 .  furosemide (LASIX) 40 MG tablet, Take 1 tablet (40 mg total) 2 (two) times daily by mouth. 40 mg BID for 5 days then 40 mg a day, Disp: 30 tablet, Rfl: 6 .  HYDROcodone-acetaminophen (NORCO) 10-325 MG tablet, Take 1 tablet every 6 (six) hours as needed by mouth., Disp: 120 tablet, Rfl: 0 .  hydrocortisone cream 1 %, Apply 1 application topically 4 (four) times daily as needed for itching., Disp: 30 g, Rfl: 0 .  midodrine (PROAMATINE) 5 MG tablet, Take 1 tablet (5 mg total) by mouth 3 (three) times  daily with meals., Disp: 90 tablet, Rfl: 3 .  mirtazapine (REMERON) 30 MG tablet, Take 1 tablet (30 mg total) at bedtime by mouth., Disp: 90 tablet, Rfl: 3 .  ondansetron (ZOFRAN) 8 MG tablet, Take 1 tablet (8 mg total) 2 (two) times daily as needed by mouth for refractory nausea / vomiting. Start on day 2 after bendamustine chemo., Disp: 30 tablet, Rfl: 1 .  polyethylene glycol (MIRALAX / GLYCOLAX) packet, Take 17 g by mouth daily as needed., Disp: 14 each, Rfl: 0 .  prochlorperazine (COMPAZINE) 10 MG tablet, Take 1 tablet (10 mg total) every 6 (six) hours as needed by mouth (Nausea or vomiting)., Disp: 30 tablet, Rfl: 1 .  rivaroxaban (XARELTO) 20 MG TABS tablet, Take 1 tablet (20 mg total) by mouth daily with supper., Disp: 30 tablet, Rfl: 6  REVIEW OF SYSTEMS:  10 Point review of Systems was done is negative except as noted above.   PHYSICAL EXAMINATION:  ECOG PERFORMANCE STATUS: 2 - Symptomatic, <50% confined to bed  . Marland KitchenBP (!) 87/61 (BP Location: Right Arm, Patient Position: Sitting) Comment: RN Samantha aware of bp  Pulse 84   Temp 97.7 F (36.5 C) (Oral)   Resp 16   Ht _0  (1.803 m)   Wt 165 lb (74.8 kg)   SpO2 97%   PF (!) 2 L/min   BMI 23.01 kg/m    .Body mass index is 21.23 kg/m.  GENERAL:alert, in no acute distress and comfortable  SKIN: no acute rashes, no significant lesions  EYES: conjunctiva are pink and non-injected, sclera anicteric  OROPHARYNX: MMM, no exudates, no oropharyngeal erythema or ulceration  NECK: supple, no JVD  LYMPH: no palpable lymphadenopathy in the cervical, axillary or inguinal regions  LUNGS: clear to auscultation b/l with normal respiratory effort  HEART: regular rate & rhythm  ABDOMEN: normoactive bowel sounds , non tender, not distended.  Extremity: 2+pitting pedal edema  PSYCH: alert & oriented x 3 with fluent speech  NEURO: no focal motor/sensory deficits   LABORATORY DATA:  I have reviewed the data as listed   . CBC Latest Ref  Rng & Units 04/02/2017 03/14/2017 03/12/2017  WBC 4.0 - 10.3 10e3/uL 49.4(H) 9.7 8.0  Hemoglobin 13.0 - 17.1 g/dL 11.2(L) 11.3(L) 9.7(L)  Hematocrit 38.4 - 49.9 % 35.2(L) 34.3(L) 30.2(L)  Platelets 140 - 400 10e3/uL 334 444(H) 477(H)    . CMP Latest Ref  Rng & Units 04/02/2017 03/17/2017 03/12/2017  Glucose 70 - 140 mg/dl 83 116(H) 87  BUN 7.0 - 26.0 mg/dL 49.3(H) 26(H) 30(H)  Creatinine 0.7 - 1.3 mg/dL 1.4(H) 1.25(H) 1.06  Sodium 136 - 145 mEq/L 134(L) 133(L) 137  Potassium 3.5 - 5.1 mEq/L 4.2 3.8 4.6  Chloride 101 - 111 mmol/L - 96(L) 99  CO2 22 - 29 mEq/L _0 Calcium 8.4 - 10.4 mg/dL 8.8 8.2(L) 7.9(L)  Total Protein 6.4 - 8.3 g/dL 5.7(L) - -  Total Bilirubin 0.20 - 1.20 mg/dL 0.60 - -  Alkaline Phos 40 - 150 U/L 303(H) - -  AST 5 - 34 U/L 25 - -  ALT 0 - 55 U/L 18 - -   Component     Latest Ref Rng & Units 03/01/2017  IgG (Immunoglobin G), Serum     700 - 1,600 mg/dL 476 (L)  IgA     61 - 437 mg/dL 72  IgM (Immunoglobulin M), Srm     15 - 143 mg/dL 1,757 (H)  Total Protein ELP     6.0 - 8.5 g/dL 4.9 (L)  Albumin SerPl Elph-Mcnc     2.9 - 4.4 g/dL 1.6 (L)  Alpha 1     0.0 - 0.4 g/dL 0.3  Alpha2 Glob SerPl Elph-Mcnc     0.4 - 1.0 g/dL 0.8  B-Globulin SerPl Elph-Mcnc     0.7 - 1.3 g/dL 0.7  Gamma Glob SerPl Elph-Mcnc     0.4 - 1.8 g/dL 1.5  M Protein SerPl Elph-Mcnc     Not Observed g/dL 1.2 (H)  Globulin, Total     2.2 - 3.9 g/dL 3.3  Albumin/Glob SerPl     0.7 - 1.7 0.5 (L)  IFE 1      Comment  Please Note (HCV):      Comment  Kappa free light chain     3.3 - 19.4 mg/L 168.2 (H)  Lamda free light chains     5.7 - 26.3 mg/L 134.6 (H)  Kappa, lamda light chain ratio     0.26 - 1.65 1.25  LDH     98 - 192 U/L 148  Beta-2 Microglobulin     0.6 - 2.4 mg/L 4.0 (H)                 *[place]*  *[facility]*  [address], [place] [address]  [phone]    -------------------------------------------------------------------  Transthoracic Echocardiography  Patient: Trevis, Eden  MR #: [id number]  Study Date: 02/22/2017  Gender: M  Age: 61  Height: 180.3 cm  Weight: 73 kg  BSA: 1.91 m^2  Pt. Status:  Room: [place], [place], [place]  [place] [id number]  Esau Grew [id number]  PERFORMING [facility], Inpatient  SONOGRAPHER Jannett Celestine, RDCS  cc:  -------------------------------------------------------------------  LV EF: 60% - 65%  -------------------------------------------------------------------  Indications: CHF - 428.0.  -------------------------------------------------------------------  History: PMH: Pulmonary Hypertension  Atrial fibrillation. Congestive heart failure. Chronic  obstructive pulmonary disease. Risk factors: Former tobacco use.  -------------------------------------------------------------------  Study Conclusions  - Left ventricle: The cavity size was normal. There was moderate  concentric hypertrophy. Systolic function was normal. The  estimated ejection fraction was in the range of 60% to 65%. Wall  motion was normal; there were no regional wall motion  abnormalities. The study was not technically sufficient to allow  evaluation of LV diastolic dysfunction due to  atrial  fibrillation.  - Aortic valve: Trileaflet; normal thickness, mildly calcified  leaflets. There was trivial regurgitation.  - Mitral valve: Calcified annulus. There was mild regurgitation.  - Left atrium: The atrium was mildly dilated.  - Right ventricle: The cavity size was moderately dilated. Wall  thickness was normal.  - Right atrium: The atrium was massively dilated.  - Tricuspid valve: There was moderate regurgitation.  - Pulmonic valve: There was trivial regurgitation.  - Pulmonary arteries: PA peak pressure: 51 mm Hg (S).  RADIOGRAPHIC STUDIES:   I have personally reviewed the radiological images as listed and agreed with the findings in the report.   Imaging Results    Whole body skeletal survey 03/02/2017  IMPRESSION: 1. No aggressive lytic or sclerotic bone lesion of the axial and appendicular skeleton.   Electronically Signed   By: Kathreen Devoid   On: 03/02/2017 12:47    ASSESSMENT & PLAN:   71 yo caucasian male with multiple medical co-morbidities with   1) Newly Diagnosed Systemic AL Amyloidosis - lambda light chain based associated with newly diagnosed Lymphoplasmacytic Lymphoma ([place])  No overt evidence of cardiac involvement based on ECHO   IgM lambda monoclonal paraproteinemia. 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 24h UPEP with 6.22g  Proteinuria daily.  PLAN  -labs reviewed with the patient and his wife -no prohibitve toxicity from C1 of BR treatment. -will need close f/u with PCP/nephrology/cardiology for continue mx of diuretics in the setting of soft BP and needing the use of midodrine. -PET/CT for initial staging of newly diagnosed lymphoplasmacytic lymphoma for staging-pending. -will pursue continued Bendamustine (dose reduced)/Rituxan with G-CSF support. -Will give referral to nutritionist to aid with optimizing his nutritional status -Pt opted to hold off on a port at this time and will consider if needed -F/u with pcp for pain management of chronic back pain - could consider orthopedic evaluation if needed.  3.)Back pain - Defer pain management and further evaluation to PCP. -MR thoracic spine was ordered by Dr. Cathlean Cower on 03/26/17 for this    4.) SEVERE protein calorie malnutrition  -refferal to nutritional therapy for this   PLAN Continue Bendamustine/Rituxan with G-CSF support as per orders Dietician consultation RTC with Dr Irene Limbo C2D1 on 04/24/2017 with labs   All of the patients questions  were answered with apparent satisfaction. The patient knows to call the clinic with any problems, questions or concerns.    I spent 20 minutes counseling the patient face to face. The total time spent in the appointment was 25 minutes and more than 50% was on counseling and direct patient cares.   Sullivan Lone MD [place] AAHIVMS [facility]  Hematology/Oncology Physician  [facility]  (Office): [phone]  (Work cell): [phone]  (Fax): [phone]    This document serves as a record of services personally performed by Sullivan Lone, MD. It was created on his behalf by Alean Rinne, a trained medical scribe. The creation of this record is based on the scribe's personal observations and the provider's statements to them.   .I have reviewed the above documentation for accuracy and completeness, and I agree with the above. Brunetta Genera MD

## 2017-03-31 NOTE — Telephone Encounter (Signed)
Pt wife had questions regarding allopurinol and acyclovir. Per Dr. Irene Limbo, pt to take allopurinol 156m daily and acyclovir 4054mdaily. Pt has visit on 11/21 if any other questions. Completed chemotherapy f/u phone call. Pt not experiencing any additional symptoms post rituxan/bendeka. Still experiencing fatigue and back pain, but those were symptoms prior to initiation of chemotherapy and immunotherapy.

## 2017-03-31 NOTE — Telephone Encounter (Signed)
Pt wife called stating she was told [facility] would fill their HYDROcodone-acetaminophen ([place]) 10-325 MG tablet Until he got an appointment with pain management, he has an appointment on 11/26, and would like enough to last until then, please advise and call back in regard

## 2017-03-31 NOTE — Telephone Encounter (Signed)
A message was left on pts voicemail regarding his first visit with Rituxan and Bendeka on 03/27/2017.  I asked him to call back with any questions or concerns he may have.

## 2017-03-31 NOTE — Telephone Encounter (Signed)
Done erx 

## 2017-04-01 ENCOUNTER — Ambulatory Visit: Payer: Medicare Other | Admitting: Nurse Practitioner

## 2017-04-01 DIAGNOSIS — I272 Pulmonary hypertension, unspecified: Secondary | ICD-10-CM | POA: Diagnosis not present

## 2017-04-01 DIAGNOSIS — M50922 Unspecified cervical disc disorder at C5-C6 level: Secondary | ICD-10-CM | POA: Diagnosis not present

## 2017-04-01 DIAGNOSIS — Z9181 History of falling: Secondary | ICD-10-CM | POA: Diagnosis not present

## 2017-04-01 DIAGNOSIS — J439 Emphysema, unspecified: Secondary | ICD-10-CM | POA: Diagnosis not present

## 2017-04-01 DIAGNOSIS — Z7901 Long term (current) use of anticoagulants: Secondary | ICD-10-CM | POA: Diagnosis not present

## 2017-04-01 DIAGNOSIS — I739 Peripheral vascular disease, unspecified: Secondary | ICD-10-CM | POA: Diagnosis not present

## 2017-04-01 DIAGNOSIS — I481 Persistent atrial fibrillation: Secondary | ICD-10-CM | POA: Diagnosis not present

## 2017-04-01 DIAGNOSIS — I110 Hypertensive heart disease with heart failure: Secondary | ICD-10-CM | POA: Diagnosis not present

## 2017-04-01 DIAGNOSIS — M1612 Unilateral primary osteoarthritis, left hip: Secondary | ICD-10-CM | POA: Diagnosis not present

## 2017-04-01 DIAGNOSIS — J9611 Chronic respiratory failure with hypoxia: Secondary | ICD-10-CM | POA: Diagnosis not present

## 2017-04-01 DIAGNOSIS — Z9981 Dependence on supplemental oxygen: Secondary | ICD-10-CM | POA: Diagnosis not present

## 2017-04-01 DIAGNOSIS — Z87891 Personal history of nicotine dependence: Secondary | ICD-10-CM | POA: Diagnosis not present

## 2017-04-01 DIAGNOSIS — Z79891 Long term (current) use of opiate analgesic: Secondary | ICD-10-CM | POA: Diagnosis not present

## 2017-04-01 DIAGNOSIS — D649 Anemia, unspecified: Secondary | ICD-10-CM | POA: Diagnosis not present

## 2017-04-01 DIAGNOSIS — I5042 Chronic combined systolic (congestive) and diastolic (congestive) heart failure: Secondary | ICD-10-CM | POA: Diagnosis not present

## 2017-04-01 DIAGNOSIS — E43 Unspecified severe protein-calorie malnutrition: Secondary | ICD-10-CM | POA: Diagnosis not present

## 2017-04-01 DIAGNOSIS — R918 Other nonspecific abnormal finding of lung field: Secondary | ICD-10-CM | POA: Diagnosis not present

## 2017-04-01 DIAGNOSIS — G8929 Other chronic pain: Secondary | ICD-10-CM | POA: Diagnosis not present

## 2017-04-02 ENCOUNTER — Other Ambulatory Visit (HOSPITAL_BASED_OUTPATIENT_CLINIC_OR_DEPARTMENT_OTHER): Payer: Medicare Other

## 2017-04-02 ENCOUNTER — Ambulatory Visit (HOSPITAL_BASED_OUTPATIENT_CLINIC_OR_DEPARTMENT_OTHER): Payer: Medicare Other | Admitting: Hematology

## 2017-04-02 ENCOUNTER — Telehealth: Payer: Self-pay | Admitting: Hematology

## 2017-04-02 ENCOUNTER — Telehealth: Payer: Self-pay | Admitting: Internal Medicine

## 2017-04-02 ENCOUNTER — Encounter: Payer: Self-pay | Admitting: Hematology

## 2017-04-02 ENCOUNTER — Other Ambulatory Visit: Payer: Self-pay

## 2017-04-02 VITALS — BP 87/61 | HR 84 | Temp 97.7°F | Resp 16 | Ht 71.0 in | Wt 165.0 lb

## 2017-04-02 DIAGNOSIS — E8581 Light chain (AL) amyloidosis: Secondary | ICD-10-CM

## 2017-04-02 DIAGNOSIS — C880 Waldenstrom macroglobulinemia: Secondary | ICD-10-CM | POA: Diagnosis not present

## 2017-04-02 DIAGNOSIS — Z9981 Dependence on supplemental oxygen: Secondary | ICD-10-CM | POA: Diagnosis not present

## 2017-04-02 DIAGNOSIS — M50922 Unspecified cervical disc disorder at C5-C6 level: Secondary | ICD-10-CM | POA: Diagnosis not present

## 2017-04-02 DIAGNOSIS — M549 Dorsalgia, unspecified: Secondary | ICD-10-CM | POA: Diagnosis not present

## 2017-04-02 DIAGNOSIS — I5042 Chronic combined systolic (congestive) and diastolic (congestive) heart failure: Secondary | ICD-10-CM | POA: Diagnosis not present

## 2017-04-02 DIAGNOSIS — D472 Monoclonal gammopathy: Secondary | ICD-10-CM

## 2017-04-02 DIAGNOSIS — J439 Emphysema, unspecified: Secondary | ICD-10-CM | POA: Diagnosis not present

## 2017-04-02 DIAGNOSIS — Z87891 Personal history of nicotine dependence: Secondary | ICD-10-CM | POA: Diagnosis not present

## 2017-04-02 DIAGNOSIS — I481 Persistent atrial fibrillation: Secondary | ICD-10-CM | POA: Diagnosis not present

## 2017-04-02 DIAGNOSIS — E43 Unspecified severe protein-calorie malnutrition: Secondary | ICD-10-CM

## 2017-04-02 DIAGNOSIS — Z9181 History of falling: Secondary | ICD-10-CM | POA: Diagnosis not present

## 2017-04-02 DIAGNOSIS — I739 Peripheral vascular disease, unspecified: Secondary | ICD-10-CM | POA: Diagnosis not present

## 2017-04-02 DIAGNOSIS — J9611 Chronic respiratory failure with hypoxia: Secondary | ICD-10-CM | POA: Diagnosis not present

## 2017-04-02 DIAGNOSIS — I272 Pulmonary hypertension, unspecified: Secondary | ICD-10-CM | POA: Diagnosis not present

## 2017-04-02 DIAGNOSIS — N049 Nephrotic syndrome with unspecified morphologic changes: Secondary | ICD-10-CM

## 2017-04-02 DIAGNOSIS — Z79891 Long term (current) use of opiate analgesic: Secondary | ICD-10-CM | POA: Diagnosis not present

## 2017-04-02 DIAGNOSIS — C8300 Small cell B-cell lymphoma, unspecified site: Secondary | ICD-10-CM

## 2017-04-02 DIAGNOSIS — G8929 Other chronic pain: Secondary | ICD-10-CM | POA: Diagnosis not present

## 2017-04-02 DIAGNOSIS — M1612 Unilateral primary osteoarthritis, left hip: Secondary | ICD-10-CM | POA: Diagnosis not present

## 2017-04-02 DIAGNOSIS — I110 Hypertensive heart disease with heart failure: Secondary | ICD-10-CM | POA: Diagnosis not present

## 2017-04-02 DIAGNOSIS — Z7901 Long term (current) use of anticoagulants: Secondary | ICD-10-CM | POA: Diagnosis not present

## 2017-04-02 DIAGNOSIS — D649 Anemia, unspecified: Secondary | ICD-10-CM | POA: Diagnosis not present

## 2017-04-02 DIAGNOSIS — R918 Other nonspecific abnormal finding of lung field: Secondary | ICD-10-CM | POA: Diagnosis not present

## 2017-04-02 LAB — CBC WITH DIFFERENTIAL/PLATELET
BASO%: 0.2 % (ref 0.0–2.0)
BASOS ABS: 0.1 10*3/uL (ref 0.0–0.1)
EOS%: 0.1 % (ref 0.0–7.0)
Eosinophils Absolute: 0.1 10*3/uL (ref 0.0–0.5)
HCT: 35.2 % — ABNORMAL LOW (ref 38.4–49.9)
HGB: 11.2 g/dL — ABNORMAL LOW (ref 13.0–17.1)
LYMPH#: 0.3 10*3/uL — AB (ref 0.9–3.3)
LYMPH%: 0.5 % — ABNORMAL LOW (ref 14.0–49.0)
MCH: 29.8 pg (ref 27.2–33.4)
MCHC: 31.9 g/dL — AB (ref 32.0–36.0)
MCV: 93.5 fL (ref 79.3–98.0)
MONO#: 1.9 10*3/uL — ABNORMAL HIGH (ref 0.1–0.9)
MONO%: 3.8 % (ref 0.0–14.0)
NEUT#: 47.1 10*3/uL — ABNORMAL HIGH (ref 1.5–6.5)
NEUT%: 95.4 % — AB (ref 39.0–75.0)
Platelets: 334 10*3/uL (ref 140–400)
RBC: 3.77 10*6/uL — AB (ref 4.20–5.82)
RDW: 14.9 % — ABNORMAL HIGH (ref 11.0–14.6)
WBC: 49.4 10*3/uL — ABNORMAL HIGH (ref 4.0–10.3)

## 2017-04-02 LAB — COMPREHENSIVE METABOLIC PANEL
ALT: 18 U/L (ref 0–55)
AST: 25 U/L (ref 5–34)
Albumin: 1.5 g/dL — ABNORMAL LOW (ref 3.5–5.0)
Alkaline Phosphatase: 303 U/L — ABNORMAL HIGH (ref 40–150)
Anion Gap: 8 mEq/L (ref 3–11)
BUN: 49.3 mg/dL — AB (ref 7.0–26.0)
CHLORIDE: 97 meq/L — AB (ref 98–109)
CO2: 29 meq/L (ref 22–29)
CREATININE: 1.4 mg/dL — AB (ref 0.7–1.3)
Calcium: 8.8 mg/dL (ref 8.4–10.4)
EGFR: 51 mL/min/{1.73_m2} — ABNORMAL LOW (ref >60)
GLUCOSE: 83 mg/dL (ref 70–140)
POTASSIUM: 4.2 meq/L (ref 3.5–5.1)
SODIUM: 134 meq/L — AB (ref 136–145)
Total Bilirubin: 0.6 mg/dL (ref 0.20–1.20)
Total Protein: 5.7 g/dL — ABNORMAL LOW (ref 6.4–8.3)

## 2017-04-02 MED ORDER — SENNOSIDES-DOCUSATE SODIUM 8.6-50 MG PO TABS: 2.0000 | ORAL_TABLET | Freq: Two times a day (BID) | ORAL | 1 refills | Status: DC

## 2017-04-02 NOTE — Telephone Encounter (Signed)
Scheduled appt per 11/21 los - Gave patient AVS and calender per los.

## 2017-04-02 NOTE — Telephone Encounter (Signed)
Amanda with [facility] called requesting verbal orders to continue OT two times a week for 3 weeks.  Can leave a message.

## 2017-04-04 ENCOUNTER — Ambulatory Visit (HOSPITAL_COMMUNITY)
Admission: RE | Admit: 2017-04-04 | Discharge: 2017-04-04 | Disposition: A | Discharge status: Home / Self Care | Payer: Medicare Other | Source: Ambulatory Visit | Attending: Hematology | Admitting: Hematology

## 2017-04-04 DIAGNOSIS — C8300 Small cell B-cell lymphoma, unspecified site: Secondary | ICD-10-CM | POA: Insufficient documentation

## 2017-04-04 DIAGNOSIS — N049 Nephrotic syndrome with unspecified morphologic changes: Secondary | ICD-10-CM | POA: Diagnosis not present

## 2017-04-04 DIAGNOSIS — D71 Functional disorders of polymorphonuclear neutrophils: Secondary | ICD-10-CM | POA: Insufficient documentation

## 2017-04-04 DIAGNOSIS — E8581 Light chain (AL) amyloidosis: Secondary | ICD-10-CM | POA: Insufficient documentation

## 2017-04-04 DIAGNOSIS — J984 Other disorders of lung: Secondary | ICD-10-CM | POA: Insufficient documentation

## 2017-04-04 DIAGNOSIS — J90 Pleural effusion, not elsewhere classified: Secondary | ICD-10-CM | POA: Insufficient documentation

## 2017-04-04 DIAGNOSIS — K8020 Calculus of gallbladder without cholecystitis without obstruction: Secondary | ICD-10-CM | POA: Diagnosis not present

## 2017-04-04 DIAGNOSIS — J439 Emphysema, unspecified: Secondary | ICD-10-CM | POA: Diagnosis not present

## 2017-04-04 DIAGNOSIS — J320 Chronic maxillary sinusitis: Secondary | ICD-10-CM | POA: Diagnosis not present

## 2017-04-04 DIAGNOSIS — I517 Cardiomegaly: Secondary | ICD-10-CM | POA: Diagnosis not present

## 2017-04-04 DIAGNOSIS — I700 Atherosclerosis of aorta: Secondary | ICD-10-CM | POA: Diagnosis not present

## 2017-04-04 DIAGNOSIS — I2510 Atherosclerotic heart disease of native coronary artery without angina pectoris: Secondary | ICD-10-CM | POA: Diagnosis not present

## 2017-04-04 LAB — GLUCOSE, CAPILLARY: GLUCOSE-CAPILLARY: 78 mg/dL (ref 65–99)

## 2017-04-07 ENCOUNTER — Ambulatory Visit: Payer: Medicare Other | Admitting: Physical Medicine & Rehabilitation

## 2017-04-07 ENCOUNTER — Ambulatory Visit (HOSPITAL_COMMUNITY)
Admission: RE | Admit: 2017-04-07 | Discharge: 2017-04-07 | Disposition: A | Discharge status: Home / Self Care | Payer: Medicare Other | Source: Ambulatory Visit | Attending: Cardiology | Admitting: Cardiology

## 2017-04-07 VITALS — BP 92/54 | HR 82 | Wt 162.8 lb

## 2017-04-07 DIAGNOSIS — Z8546 Personal history of malignant neoplasm of prostate: Secondary | ICD-10-CM | POA: Diagnosis not present

## 2017-04-07 DIAGNOSIS — N049 Nephrotic syndrome with unspecified morphologic changes: Secondary | ICD-10-CM | POA: Diagnosis not present

## 2017-04-07 DIAGNOSIS — Z87891 Personal history of nicotine dependence: Secondary | ICD-10-CM | POA: Insufficient documentation

## 2017-04-07 DIAGNOSIS — I4819 Other persistent atrial fibrillation: Secondary | ICD-10-CM

## 2017-04-07 DIAGNOSIS — Z79899 Other long term (current) drug therapy: Secondary | ICD-10-CM | POA: Diagnosis not present

## 2017-04-07 DIAGNOSIS — Z833 Family history of diabetes mellitus: Secondary | ICD-10-CM | POA: Diagnosis not present

## 2017-04-07 DIAGNOSIS — I481 Persistent atrial fibrillation: Secondary | ICD-10-CM

## 2017-04-07 DIAGNOSIS — I5032 Chronic diastolic (congestive) heart failure: Secondary | ICD-10-CM

## 2017-04-07 DIAGNOSIS — E8809 Other disorders of plasma-protein metabolism, not elsewhere classified: Secondary | ICD-10-CM | POA: Insufficient documentation

## 2017-04-07 DIAGNOSIS — Z7901 Long term (current) use of anticoagulants: Secondary | ICD-10-CM | POA: Diagnosis not present

## 2017-04-07 DIAGNOSIS — Z808 Family history of malignant neoplasm of other organs or systems: Secondary | ICD-10-CM | POA: Insufficient documentation

## 2017-04-07 DIAGNOSIS — I951 Orthostatic hypotension: Secondary | ICD-10-CM | POA: Diagnosis not present

## 2017-04-07 NOTE — Telephone Encounter (Signed)
Verbal orders have been given

## 2017-04-07 NOTE — Patient Instructions (Signed)
Home Health will provide unna boots   Your physician recommends that you schedule a follow-up appointment in: 2 months with Dr. Aundra Dubin

## 2017-04-07 NOTE — Progress Notes (Signed)
PCP: Dr. Jenny Reichmann Cardiology: Dr Aundra Dubin  Hematologist:  Dr Irene Limbo   HPI:  71 yo with persistent atrial fibrillation, chronic diastolic CHF with anasarca, neprotic syndrome, weight loss, pleural effusions, and hypoalbuminemia.  Admitted 02/21/17 with respiratory failure and volume overload. Hospital course complicated by atrial fibrillation, acute on chronic diastolic CHF, nephrotic syndrome, pleural effusion, orthostatic hypotension requiring midodrine, and diagnosis of AL amyloidosis. Hematology consulted.  He was cardioverted to NSR during this admission but atrial fibrillation had recurred at hospital followup.   On 03/14/2017 he had bone marrow biopsy suggestive of Waldenstrom's macroglobulinemia. He has been started on chemotherapy with bendamustine and rituximab.   Weight has been stable.  SBP primarily in the 100s on midodrine without lightheadedness or syncope.  Main problem recently has been severe mid to low back pain.  This has severely limited his mobility and greatly impaired quality of life. Hurts to move/breathe.  MRI ordered but not yet done. He is not short of breath at rest of with light activity (not doing much more than light activity at this point).  No chest pain.  No orthopnea/PND. He still has significant lower leg edema. Left forearm remains swollen.   Labs (10/18): K 4.6, creatinine 1.06, ESR 121, IgM monoclonal protein.  Labs (11/18): K 4.2, creatinine 1.4  ECG (personally reviewed): atrial fibrillation rate 79, inferior and anterior TWIs  PMH:  1. Atrial fibrillation: Persistent.  DCCV in 10/18 but atrial fibrillation recurred.  2. Chronic Diastolic Heart Failure: Echo (10/18) with moderate LVH, EF 60-65%, moderately dilated RV with normal systolic function, mild MR, moderate TR.  - Suspect cardiac amyloidosis involvement.  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  3. Nephrotic Syndrome: Secondary to AL amyloidosis.  4. 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  5. 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  Diagnosed by renal biopsy, [place] red positive. Manifested as nephrotic syndrome, CHF, and autonomic neuropathy.   6. Waldenstrom's macroglobulinemia suggested by bone marrow biopsy 11/18.  - bendamustine/Rituximab chemo planned for 6 months.  7. H/o prostate cancer.   ROS: All systems negative except as listed in HPI, PMH and Problem List.  SH:  Social History   Socioeconomic History  . Marital status: Married    Spouse name: Not on file  . Number of children: Not on file  . Years of education: Not on file  . Highest education level: Not on file  Social Needs  . Financial resource strain: Not on file  . Food insecurity - worry: Not on file  . Food insecurity - inability: Not on file  . Transportation needs - medical: Not on file  . Transportation needs - non-medical: Not on file  Occupational History  . Not on file  Tobacco Use  . Smoking status: Former Smoker    Packs/day: 1.00    Years: 62.00    Pack years: 62.00    Types: Cigarettes    Last attempt to quit: 05/14/2016    Years since quitting: 0.8  . Smokeless tobacco: Never Used  Substance and Sexual Activity  . Alcohol use: No    Alcohol/week: 0.0 oz  . Drug use: No    Comment: CBD OIL  . Sexual activity: Not Currently    Partners: Female  Other Topics Concern  . Not on file  Social History Narrative  . Not on file    FH:  Family History  Problem Relation Age of Onset  . Colon cancer  Mother   . Ovarian cancer Mother   . Alcoholism Father   . CAD Father   . Alcohol abuse Father   . Ovarian cancer Sister   . Diabetes Neg Hx   . Stomach cancer Neg Hx     Current Outpatient Medications  Medication Sig Dispense Refill  . acyclovir (ZOVIRAX) 400 MG tablet Take 1 tablet (400 mg total) daily by mouth. 30 tablet 3  . allopurinol (ZYLOPRIM) 100 MG tablet 1 to 3 tablets daily as directed prior to  chemotherapy 30 tablet 1  . dexamethasone (DECADRON) 4 MG tablet Take 2 tablets (8 mg total) daily by mouth. Start the day after bendamustine chemotherapy for 2 days. Take with food. 30 tablet 1  . docusate sodium (COLACE) 100 MG capsule Take 1-3 capsules (100-300 mg total) by mouth daily. 90 capsule 5  . feeding supplement (BOOST / RESOURCE BREEZE) LIQD Take 1 Container by mouth 3 (three) times daily between meals. 5 Container 0  . furosemide (LASIX) 40 MG tablet Take 1 tablet (40 mg total) 2 (two) times daily by mouth. 40 mg BID for 5 days then 40 mg a day 30 tablet 6  . HYDROcodone-acetaminophen (NORCO) 10-325 MG tablet Take 1 tablet every 6 (six) hours as needed by mouth. 120 tablet 0  . hydrocortisone cream 1 % Apply 1 application topically 4 (four) times daily as needed for itching. 30 g 0  . midodrine (PROAMATINE) 5 MG tablet Take 1 tablet (5 mg total) by mouth 3 (three) times daily with meals. 90 tablet 3  . mirtazapine (REMERON) 30 MG tablet Take 1 tablet (30 mg total) at bedtime by mouth. 90 tablet 3  . ondansetron (ZOFRAN) 8 MG tablet Take 1 tablet (8 mg total) 2 (two) times daily as needed by mouth for refractory nausea / vomiting. Start on day 2 after bendamustine chemo. 30 tablet 1  . polyethylene glycol (MIRALAX / GLYCOLAX) packet Take 17 g by mouth daily as needed. 14 each 0  . prochlorperazine (COMPAZINE) 10 MG tablet Take 1 tablet (10 mg total) every 6 (six) hours as needed by mouth (Nausea or vomiting). 30 tablet 1  . rivaroxaban (XARELTO) 20 MG TABS tablet Take 1 tablet (20 mg total) by mouth daily with supper. 30 tablet 6  . senna-docusate (SENNA S) 8.6-50 MG tablet Take 2 tablets by mouth 2 (two) times daily. Reduce to 2 tab po HS once constipation resolved 60 tablet 1   No current facility-administered medications for this encounter.     Vitals:   04/07/17 1104  BP: (!) 92/54  Pulse: 82  SpO2: 92%  Weight: 162 lb 12.8 oz (73.8 kg)    PHYSICAL EXAM: General: Frail,  elderly, in wheelchair Neck: JVP 7-8 cm, no thyromegaly or thyroid nodule.  Lungs: Clear to auscultation bilaterally with normal respiratory effort. CV: Nondisplaced PMI.  Heart irregular S1/S2, no S3/S4, no murmur.  2+ edema to knees bilaterally.  Left forearm swollen.  No carotid bruit. Difficult to palpate pedal pulses with edema.   Abdomen: Soft, nontender, no hepatosplenomegaly, no distention.  Skin: Intact without lesions or rashes.  Neurologic: Alert and oriented x 3.  Psych: Normal affect. Extremities: No clubbing or cyanosis.  HEENT: Normal.   ASSESSMENT & PLAN: 1. Chronic diastolic CHF: Patient has been volume overloaded but diuresis has been difficult due to low BP and hypoalbuminemia.  He has been diagnosed with AL amyloidosis, this has caused nephrotic syndrome and likely has also involved the heart with diastolic  CHF.  - Continue midodrine 5 mg tid.  - I will continue Lasix at 40 mg bid.  - I will try to arrange for Unna boot placement given significant lower leg edema.    2. Hypoalbuminemia: With 3rd spacing. Due to nephrotic syndrome from renal involvement by amyloidosis. 3. Anorexia: Encouraged protein supplementation.  4. Atrial fibrillation: Persistent.  He is back in atrial fibrillation after DCCV in 10/18.  Given good rate control and no definite improvement in symptoms when he was in NSR, we will continue rate control/anticoagulation strategy.   - Continue Xarelto.  5. Nephrotic syndrome: Suspect due to AL amyloidosis.  6. Orthostatic hypotension: Suspect from autonomic neuropathy from amyloidosis. He is on midodrine, symptoms improved.  7. Bone marrow biopsy suggestive of Waldenstrom's macroglobulinemia which likely underlies the amyloidosis.  He has an appointment with hematology.   Followup in 2 months.    [name] 04/07/2017

## 2017-04-07 NOTE — Telephone Encounter (Signed)
[place] for [employer]

## 2017-04-08 DIAGNOSIS — G8929 Other chronic pain: Secondary | ICD-10-CM | POA: Diagnosis not present

## 2017-04-08 DIAGNOSIS — I481 Persistent atrial fibrillation: Secondary | ICD-10-CM | POA: Diagnosis not present

## 2017-04-08 DIAGNOSIS — I110 Hypertensive heart disease with heart failure: Secondary | ICD-10-CM | POA: Diagnosis not present

## 2017-04-08 DIAGNOSIS — R918 Other nonspecific abnormal finding of lung field: Secondary | ICD-10-CM | POA: Diagnosis not present

## 2017-04-08 DIAGNOSIS — M50922 Unspecified cervical disc disorder at C5-C6 level: Secondary | ICD-10-CM | POA: Diagnosis not present

## 2017-04-08 DIAGNOSIS — I272 Pulmonary hypertension, unspecified: Secondary | ICD-10-CM | POA: Diagnosis not present

## 2017-04-08 DIAGNOSIS — J9611 Chronic respiratory failure with hypoxia: Secondary | ICD-10-CM | POA: Diagnosis not present

## 2017-04-08 DIAGNOSIS — D649 Anemia, unspecified: Secondary | ICD-10-CM | POA: Diagnosis not present

## 2017-04-08 DIAGNOSIS — I739 Peripheral vascular disease, unspecified: Secondary | ICD-10-CM | POA: Diagnosis not present

## 2017-04-08 DIAGNOSIS — M1612 Unilateral primary osteoarthritis, left hip: Secondary | ICD-10-CM | POA: Diagnosis not present

## 2017-04-08 DIAGNOSIS — I5042 Chronic combined systolic (congestive) and diastolic (congestive) heart failure: Secondary | ICD-10-CM | POA: Diagnosis not present

## 2017-04-08 DIAGNOSIS — J439 Emphysema, unspecified: Secondary | ICD-10-CM | POA: Diagnosis not present

## 2017-04-09 ENCOUNTER — Encounter (HOSPITAL_COMMUNITY): Payer: Medicare Other | Admitting: Internal Medicine

## 2017-04-09 ENCOUNTER — Telehealth: Payer: Self-pay | Admitting: Internal Medicine

## 2017-04-09 DIAGNOSIS — I481 Persistent atrial fibrillation: Secondary | ICD-10-CM | POA: Diagnosis not present

## 2017-04-09 DIAGNOSIS — M1612 Unilateral primary osteoarthritis, left hip: Secondary | ICD-10-CM | POA: Diagnosis not present

## 2017-04-09 DIAGNOSIS — R918 Other nonspecific abnormal finding of lung field: Secondary | ICD-10-CM | POA: Diagnosis not present

## 2017-04-09 DIAGNOSIS — J439 Emphysema, unspecified: Secondary | ICD-10-CM | POA: Diagnosis not present

## 2017-04-09 DIAGNOSIS — M50922 Unspecified cervical disc disorder at C5-C6 level: Secondary | ICD-10-CM | POA: Diagnosis not present

## 2017-04-09 DIAGNOSIS — I110 Hypertensive heart disease with heart failure: Secondary | ICD-10-CM | POA: Diagnosis not present

## 2017-04-09 DIAGNOSIS — D649 Anemia, unspecified: Secondary | ICD-10-CM | POA: Diagnosis not present

## 2017-04-09 DIAGNOSIS — J9611 Chronic respiratory failure with hypoxia: Secondary | ICD-10-CM | POA: Diagnosis not present

## 2017-04-09 DIAGNOSIS — I5042 Chronic combined systolic (congestive) and diastolic (congestive) heart failure: Secondary | ICD-10-CM | POA: Diagnosis not present

## 2017-04-09 DIAGNOSIS — I272 Pulmonary hypertension, unspecified: Secondary | ICD-10-CM | POA: Diagnosis not present

## 2017-04-09 DIAGNOSIS — I739 Peripheral vascular disease, unspecified: Secondary | ICD-10-CM | POA: Diagnosis not present

## 2017-04-09 DIAGNOSIS — G8929 Other chronic pain: Secondary | ICD-10-CM | POA: Diagnosis not present

## 2017-04-09 NOTE — Telephone Encounter (Signed)
Unfortunately this is not possible, as altogether would be over 3 hours in the MRI, and costing around $10,000, and the initial evalution should be at the site of most concern to start with, which is the lower back.  The other can be possibly done in the future, but this would require further office visit and documentation to make sure this is appropriate and covvered  By the insurance

## 2017-04-09 NOTE — Telephone Encounter (Signed)
Copied from [place] [phone]. Topic: General - Other >> Apr 09, 2017 11:24 AM Neva Seat wrote: Pt's wife called saying her husband is scheduled for his 6:45 pm MRI Appt on Nov. 29th. At  [facility] on [address]. [phone] Pt is in pain from neck to lower back and wants to make sure he is being looked at for his upper lower back - thoracic area and to make sure of no muscle damage Pt wants the request make to make sure pt has all these areas checked with the MRI.

## 2017-04-09 NOTE — Telephone Encounter (Signed)
Called pt, LVM  CRM done.

## 2017-04-09 NOTE — Telephone Encounter (Signed)
See message below

## 2017-04-10 ENCOUNTER — Ambulatory Visit (HOSPITAL_COMMUNITY)
Admission: RE | Admit: 2017-04-10 | Discharge: 2017-04-10 | Disposition: A | Discharge status: Home / Self Care | Payer: Medicare Other | Source: Ambulatory Visit | Attending: Internal Medicine | Admitting: Internal Medicine

## 2017-04-10 DIAGNOSIS — I110 Hypertensive heart disease with heart failure: Secondary | ICD-10-CM | POA: Diagnosis not present

## 2017-04-10 DIAGNOSIS — D649 Anemia, unspecified: Secondary | ICD-10-CM | POA: Diagnosis not present

## 2017-04-10 DIAGNOSIS — M546 Pain in thoracic spine: Secondary | ICD-10-CM | POA: Diagnosis present

## 2017-04-10 DIAGNOSIS — J9811 Atelectasis: Secondary | ICD-10-CM | POA: Diagnosis not present

## 2017-04-10 DIAGNOSIS — J439 Emphysema, unspecified: Secondary | ICD-10-CM | POA: Diagnosis not present

## 2017-04-10 DIAGNOSIS — M47814 Spondylosis without myelopathy or radiculopathy, thoracic region: Secondary | ICD-10-CM | POA: Diagnosis not present

## 2017-04-10 DIAGNOSIS — M5134 Other intervertebral disc degeneration, thoracic region: Secondary | ICD-10-CM | POA: Diagnosis not present

## 2017-04-10 DIAGNOSIS — J9611 Chronic respiratory failure with hypoxia: Secondary | ICD-10-CM | POA: Diagnosis not present

## 2017-04-10 DIAGNOSIS — I5042 Chronic combined systolic (congestive) and diastolic (congestive) heart failure: Secondary | ICD-10-CM | POA: Diagnosis not present

## 2017-04-10 DIAGNOSIS — R918 Other nonspecific abnormal finding of lung field: Secondary | ICD-10-CM | POA: Diagnosis not present

## 2017-04-10 DIAGNOSIS — M1612 Unilateral primary osteoarthritis, left hip: Secondary | ICD-10-CM | POA: Diagnosis not present

## 2017-04-10 DIAGNOSIS — I481 Persistent atrial fibrillation: Secondary | ICD-10-CM | POA: Diagnosis not present

## 2017-04-10 DIAGNOSIS — I272 Pulmonary hypertension, unspecified: Secondary | ICD-10-CM | POA: Diagnosis not present

## 2017-04-10 DIAGNOSIS — I739 Peripheral vascular disease, unspecified: Secondary | ICD-10-CM | POA: Diagnosis not present

## 2017-04-10 DIAGNOSIS — J90 Pleural effusion, not elsewhere classified: Secondary | ICD-10-CM | POA: Insufficient documentation

## 2017-04-10 DIAGNOSIS — M4184 Other forms of scoliosis, thoracic region: Secondary | ICD-10-CM | POA: Diagnosis not present

## 2017-04-10 DIAGNOSIS — M50922 Unspecified cervical disc disorder at C5-C6 level: Secondary | ICD-10-CM | POA: Diagnosis not present

## 2017-04-10 DIAGNOSIS — G8929 Other chronic pain: Secondary | ICD-10-CM | POA: Diagnosis not present

## 2017-04-10 MED ORDER — DIAZEPAM 5 MG PO TABS: ORAL_TABLET | ORAL | 0 refills | Status: DC

## 2017-04-10 NOTE — Telephone Encounter (Signed)
Copied from [place]. Topic: Quick Communication - See Telephone Encounter >> Apr 10, 2017  3:51 PM Vernona Rieger wrote: CRM for notification. See Telephone encounter for:  Pt is having a MRI tonight at 7pm, pt's wife is requesting he have one pill called in for anxious and nervousness. Pharmacy is [facility] on [address].

## 2017-04-10 NOTE — Telephone Encounter (Signed)
Medication  Request

## 2017-04-10 NOTE — Telephone Encounter (Signed)
Done erx 

## 2017-04-11 ENCOUNTER — Encounter: Payer: Self-pay | Admitting: Internal Medicine

## 2017-04-11 ENCOUNTER — Telehealth: Payer: Self-pay | Admitting: Internal Medicine

## 2017-04-11 NOTE — Telephone Encounter (Signed)
Copied from [place] [phone]. Topic: Inquiry >> Apr 11, 2017  9:34 AM Bea Graff, NT wrote: Reason for CRM: Nicolette from [facility] calling needing a verbal order for this pt to have home health and physical therapy 2 times per week for 4 weeks. Medical social work eval for transportation. CB#: [phone]

## 2017-04-11 NOTE — Telephone Encounter (Signed)
Spoke with Nicollete and gave verbal okay as requested.

## 2017-04-11 NOTE — Telephone Encounter (Signed)
[name] for [employer] for all

## 2017-04-14 DIAGNOSIS — I481 Persistent atrial fibrillation: Secondary | ICD-10-CM | POA: Diagnosis not present

## 2017-04-14 DIAGNOSIS — I5042 Chronic combined systolic (congestive) and diastolic (congestive) heart failure: Secondary | ICD-10-CM | POA: Diagnosis not present

## 2017-04-14 DIAGNOSIS — D649 Anemia, unspecified: Secondary | ICD-10-CM | POA: Diagnosis not present

## 2017-04-14 DIAGNOSIS — J9611 Chronic respiratory failure with hypoxia: Secondary | ICD-10-CM | POA: Diagnosis not present

## 2017-04-14 DIAGNOSIS — R918 Other nonspecific abnormal finding of lung field: Secondary | ICD-10-CM | POA: Diagnosis not present

## 2017-04-14 DIAGNOSIS — I739 Peripheral vascular disease, unspecified: Secondary | ICD-10-CM | POA: Diagnosis not present

## 2017-04-14 DIAGNOSIS — M50922 Unspecified cervical disc disorder at C5-C6 level: Secondary | ICD-10-CM | POA: Diagnosis not present

## 2017-04-14 DIAGNOSIS — J439 Emphysema, unspecified: Secondary | ICD-10-CM | POA: Diagnosis not present

## 2017-04-14 DIAGNOSIS — M1612 Unilateral primary osteoarthritis, left hip: Secondary | ICD-10-CM | POA: Diagnosis not present

## 2017-04-14 DIAGNOSIS — I110 Hypertensive heart disease with heart failure: Secondary | ICD-10-CM | POA: Diagnosis not present

## 2017-04-14 DIAGNOSIS — I272 Pulmonary hypertension, unspecified: Secondary | ICD-10-CM | POA: Diagnosis not present

## 2017-04-14 DIAGNOSIS — G8929 Other chronic pain: Secondary | ICD-10-CM | POA: Diagnosis not present

## 2017-04-15 ENCOUNTER — Telehealth: Payer: Self-pay | Admitting: Internal Medicine

## 2017-04-15 DIAGNOSIS — I481 Persistent atrial fibrillation: Secondary | ICD-10-CM | POA: Diagnosis not present

## 2017-04-15 DIAGNOSIS — J9611 Chronic respiratory failure with hypoxia: Secondary | ICD-10-CM | POA: Diagnosis not present

## 2017-04-15 DIAGNOSIS — D649 Anemia, unspecified: Secondary | ICD-10-CM | POA: Diagnosis not present

## 2017-04-15 DIAGNOSIS — G8929 Other chronic pain: Secondary | ICD-10-CM | POA: Diagnosis not present

## 2017-04-15 DIAGNOSIS — M1612 Unilateral primary osteoarthritis, left hip: Secondary | ICD-10-CM | POA: Diagnosis not present

## 2017-04-15 DIAGNOSIS — J439 Emphysema, unspecified: Secondary | ICD-10-CM | POA: Diagnosis not present

## 2017-04-15 DIAGNOSIS — R918 Other nonspecific abnormal finding of lung field: Secondary | ICD-10-CM | POA: Diagnosis not present

## 2017-04-15 DIAGNOSIS — I5042 Chronic combined systolic (congestive) and diastolic (congestive) heart failure: Secondary | ICD-10-CM | POA: Diagnosis not present

## 2017-04-15 DIAGNOSIS — M50922 Unspecified cervical disc disorder at C5-C6 level: Secondary | ICD-10-CM | POA: Diagnosis not present

## 2017-04-15 DIAGNOSIS — I739 Peripheral vascular disease, unspecified: Secondary | ICD-10-CM | POA: Diagnosis not present

## 2017-04-15 DIAGNOSIS — I110 Hypertensive heart disease with heart failure: Secondary | ICD-10-CM | POA: Diagnosis not present

## 2017-04-15 DIAGNOSIS — I272 Pulmonary hypertension, unspecified: Secondary | ICD-10-CM | POA: Diagnosis not present

## 2017-04-15 NOTE — Telephone Encounter (Deleted)
ERROR

## 2017-04-15 NOTE — Telephone Encounter (Signed)
Pt has been informed and expressed understanding.  

## 2017-04-15 NOTE — Telephone Encounter (Signed)
Please advise 

## 2017-04-15 NOTE — Telephone Encounter (Signed)
Letter was sent  T spine with scoliosis and mild disc deterioration only  No need for change in tx

## 2017-04-15 NOTE — Telephone Encounter (Signed)
Copied from [place]. Topic: Quick Communication - See Telephone Encounter >> Apr 15, 2017 12:05 PM Aurelio Brash B wrote: CRM for notification. See Telephone encounter for:  PT is asking for MRI results 04/15/17.

## 2017-04-16 ENCOUNTER — Telehealth: Payer: Self-pay | Admitting: Internal Medicine

## 2017-04-16 DIAGNOSIS — D649 Anemia, unspecified: Secondary | ICD-10-CM | POA: Diagnosis not present

## 2017-04-16 DIAGNOSIS — I5042 Chronic combined systolic (congestive) and diastolic (congestive) heart failure: Secondary | ICD-10-CM | POA: Diagnosis not present

## 2017-04-16 DIAGNOSIS — R918 Other nonspecific abnormal finding of lung field: Secondary | ICD-10-CM | POA: Diagnosis not present

## 2017-04-16 DIAGNOSIS — M546 Pain in thoracic spine: Secondary | ICD-10-CM | POA: Diagnosis not present

## 2017-04-16 DIAGNOSIS — M50922 Unspecified cervical disc disorder at C5-C6 level: Secondary | ICD-10-CM | POA: Diagnosis not present

## 2017-04-16 DIAGNOSIS — I272 Pulmonary hypertension, unspecified: Secondary | ICD-10-CM | POA: Diagnosis not present

## 2017-04-16 DIAGNOSIS — I481 Persistent atrial fibrillation: Secondary | ICD-10-CM | POA: Diagnosis not present

## 2017-04-16 DIAGNOSIS — G8929 Other chronic pain: Secondary | ICD-10-CM | POA: Diagnosis not present

## 2017-04-16 DIAGNOSIS — J9611 Chronic respiratory failure with hypoxia: Secondary | ICD-10-CM | POA: Diagnosis not present

## 2017-04-16 DIAGNOSIS — J439 Emphysema, unspecified: Secondary | ICD-10-CM | POA: Diagnosis not present

## 2017-04-16 DIAGNOSIS — I110 Hypertensive heart disease with heart failure: Secondary | ICD-10-CM | POA: Diagnosis not present

## 2017-04-16 DIAGNOSIS — M545 Low back pain: Secondary | ICD-10-CM | POA: Diagnosis not present

## 2017-04-16 DIAGNOSIS — M1612 Unilateral primary osteoarthritis, left hip: Secondary | ICD-10-CM | POA: Diagnosis not present

## 2017-04-16 DIAGNOSIS — I739 Peripheral vascular disease, unspecified: Secondary | ICD-10-CM | POA: Diagnosis not present

## 2017-04-16 NOTE — Telephone Encounter (Signed)
Copied from [place]. Topic: Inquiry >> Apr 16, 2017 11:32 AM Conception Chancy, NT wrote: Reason for CRM: Lysbeth Galas from [facility] homehealth he fell this morning, verbal orders for home health aid to help with shower 2x a week.   Lysbeth Galas [phone]

## 2017-04-16 NOTE — Telephone Encounter (Signed)
Notified Lysbeth Galas w/MD approval../lmb

## 2017-04-16 NOTE — Telephone Encounter (Signed)
MD is out of the office this pls advise on msg below.Marland KitchenJohny Chess

## 2017-04-16 NOTE — Telephone Encounter (Signed)
Ok to give verbal 

## 2017-04-17 DIAGNOSIS — I272 Pulmonary hypertension, unspecified: Secondary | ICD-10-CM | POA: Diagnosis not present

## 2017-04-17 DIAGNOSIS — M1612 Unilateral primary osteoarthritis, left hip: Secondary | ICD-10-CM | POA: Diagnosis not present

## 2017-04-17 DIAGNOSIS — I481 Persistent atrial fibrillation: Secondary | ICD-10-CM | POA: Diagnosis not present

## 2017-04-17 DIAGNOSIS — I5042 Chronic combined systolic (congestive) and diastolic (congestive) heart failure: Secondary | ICD-10-CM | POA: Diagnosis not present

## 2017-04-17 DIAGNOSIS — J9611 Chronic respiratory failure with hypoxia: Secondary | ICD-10-CM | POA: Diagnosis not present

## 2017-04-17 DIAGNOSIS — D649 Anemia, unspecified: Secondary | ICD-10-CM | POA: Diagnosis not present

## 2017-04-17 DIAGNOSIS — I739 Peripheral vascular disease, unspecified: Secondary | ICD-10-CM | POA: Diagnosis not present

## 2017-04-17 DIAGNOSIS — G8929 Other chronic pain: Secondary | ICD-10-CM | POA: Diagnosis not present

## 2017-04-17 DIAGNOSIS — J439 Emphysema, unspecified: Secondary | ICD-10-CM | POA: Diagnosis not present

## 2017-04-17 DIAGNOSIS — I110 Hypertensive heart disease with heart failure: Secondary | ICD-10-CM | POA: Diagnosis not present

## 2017-04-17 DIAGNOSIS — M50922 Unspecified cervical disc disorder at C5-C6 level: Secondary | ICD-10-CM | POA: Diagnosis not present

## 2017-04-17 DIAGNOSIS — R918 Other nonspecific abnormal finding of lung field: Secondary | ICD-10-CM | POA: Diagnosis not present

## 2017-04-18 ENCOUNTER — Telehealth: Payer: Self-pay | Admitting: Internal Medicine

## 2017-04-18 DIAGNOSIS — M50922 Unspecified cervical disc disorder at C5-C6 level: Secondary | ICD-10-CM | POA: Diagnosis not present

## 2017-04-18 DIAGNOSIS — D649 Anemia, unspecified: Secondary | ICD-10-CM | POA: Diagnosis not present

## 2017-04-18 DIAGNOSIS — I110 Hypertensive heart disease with heart failure: Secondary | ICD-10-CM | POA: Diagnosis not present

## 2017-04-18 DIAGNOSIS — R918 Other nonspecific abnormal finding of lung field: Secondary | ICD-10-CM | POA: Diagnosis not present

## 2017-04-18 DIAGNOSIS — I5042 Chronic combined systolic (congestive) and diastolic (congestive) heart failure: Secondary | ICD-10-CM | POA: Diagnosis not present

## 2017-04-18 DIAGNOSIS — J9611 Chronic respiratory failure with hypoxia: Secondary | ICD-10-CM | POA: Diagnosis not present

## 2017-04-18 DIAGNOSIS — I272 Pulmonary hypertension, unspecified: Secondary | ICD-10-CM | POA: Diagnosis not present

## 2017-04-18 DIAGNOSIS — J439 Emphysema, unspecified: Secondary | ICD-10-CM | POA: Diagnosis not present

## 2017-04-18 DIAGNOSIS — I739 Peripheral vascular disease, unspecified: Secondary | ICD-10-CM | POA: Diagnosis not present

## 2017-04-18 DIAGNOSIS — G8929 Other chronic pain: Secondary | ICD-10-CM | POA: Diagnosis not present

## 2017-04-18 DIAGNOSIS — I481 Persistent atrial fibrillation: Secondary | ICD-10-CM | POA: Diagnosis not present

## 2017-04-18 DIAGNOSIS — M1612 Unilateral primary osteoarthritis, left hip: Secondary | ICD-10-CM | POA: Diagnosis not present

## 2017-04-18 NOTE — Telephone Encounter (Signed)
Very sorry, I am unable to assist as I do not provide chronic pain management in my practice  We will need to refer to pain management, or perhaps pt and wife will need to seek other PCP, sorry

## 2017-04-18 NOTE — Telephone Encounter (Signed)
Copied from [place] [phone]. Topic: Inquiry >> Apr 18, 2017  3:12 PM [place], [place], [place] wrote: Reason for CRM: Patient wife called because she wanted to let Dr.John know that her husband can't go to the pain clinic because he is home bound now and wanted to know if he could manage his pain medication. Also he had an episode this morning that hasn't happened before. He stared shaking all over. If someone could give her a call back about these questions at [phone]

## 2017-04-23 NOTE — Telephone Encounter (Signed)
CRM created.

## 2017-04-23 NOTE — Telephone Encounter (Signed)
Called pt, LVM.   

## 2017-04-24 ENCOUNTER — Ambulatory Visit (HOSPITAL_BASED_OUTPATIENT_CLINIC_OR_DEPARTMENT_OTHER): Payer: Medicare Other | Admitting: Hematology

## 2017-04-24 ENCOUNTER — Other Ambulatory Visit (HOSPITAL_BASED_OUTPATIENT_CLINIC_OR_DEPARTMENT_OTHER): Payer: Medicare Other

## 2017-04-24 ENCOUNTER — Encounter: Payer: Self-pay | Admitting: Hematology

## 2017-04-24 ENCOUNTER — Ambulatory Visit (HOSPITAL_BASED_OUTPATIENT_CLINIC_OR_DEPARTMENT_OTHER): Payer: Medicare Other

## 2017-04-24 ENCOUNTER — Ambulatory Visit: Payer: Medicare Other | Admitting: Nutrition

## 2017-04-24 VITALS — BP 88/66 | HR 64 | Temp 97.9°F | Resp 16

## 2017-04-24 VITALS — BP 90/49 | HR 82 | Temp 97.7°F | Resp 20 | Wt 163.0 lb

## 2017-04-24 DIAGNOSIS — D472 Monoclonal gammopathy: Secondary | ICD-10-CM | POA: Diagnosis not present

## 2017-04-24 DIAGNOSIS — C880 Waldenstrom macroglobulinemia: Secondary | ICD-10-CM

## 2017-04-24 DIAGNOSIS — E43 Unspecified severe protein-calorie malnutrition: Secondary | ICD-10-CM

## 2017-04-24 DIAGNOSIS — D892 Hypergammaglobulinemia, unspecified: Secondary | ICD-10-CM

## 2017-04-24 DIAGNOSIS — E8581 Light chain (AL) amyloidosis: Secondary | ICD-10-CM

## 2017-04-24 DIAGNOSIS — Z5112 Encounter for antineoplastic immunotherapy: Secondary | ICD-10-CM

## 2017-04-24 DIAGNOSIS — I959 Hypotension, unspecified: Secondary | ICD-10-CM | POA: Diagnosis not present

## 2017-04-24 DIAGNOSIS — R5383 Other fatigue: Secondary | ICD-10-CM | POA: Diagnosis not present

## 2017-04-24 DIAGNOSIS — N049 Nephrotic syndrome with unspecified morphologic changes: Secondary | ICD-10-CM

## 2017-04-24 DIAGNOSIS — C8300 Small cell B-cell lymphoma, unspecified site: Secondary | ICD-10-CM

## 2017-04-24 DIAGNOSIS — M549 Dorsalgia, unspecified: Secondary | ICD-10-CM

## 2017-04-24 DIAGNOSIS — Z7189 Other specified counseling: Secondary | ICD-10-CM

## 2017-04-24 LAB — COMPREHENSIVE METABOLIC PANEL
ALBUMIN: 1.5 g/dL — AB (ref 3.5–5.0)
ALK PHOS: 341 U/L — AB (ref 40–150)
ALT: 14 U/L (ref 0–55)
ANION GAP: 8 meq/L (ref 3–11)
AST: 19 U/L (ref 5–34)
BUN: 27.9 mg/dL — ABNORMAL HIGH (ref 7.0–26.0)
CALCIUM: 8.3 mg/dL — AB (ref 8.4–10.4)
CHLORIDE: 101 meq/L (ref 98–109)
CO2: 27 mEq/L (ref 22–29)
Creatinine: 1 mg/dL (ref 0.7–1.3)
EGFR: >60 mL/min/{1.73_m2} (ref >60)
Glucose: 94 mg/dl (ref 70–140)
POTASSIUM: 4 meq/L (ref 3.5–5.1)
Sodium: 136 mEq/L (ref 136–145)
Total Bilirubin: 0.42 mg/dL (ref 0.20–1.20)
Total Protein: 5.2 g/dL — ABNORMAL LOW (ref 6.4–8.3)

## 2017-04-24 LAB — CBC & DIFF AND RETIC
BASO%: 1.1 % (ref 0.0–2.0)
Basophils Absolute: 0.1 10*3/uL (ref 0.0–0.1)
EOS ABS: 0.2 10*3/uL (ref 0.0–0.5)
EOS%: 4.3 % (ref 0.0–7.0)
HCT: 34.9 % — ABNORMAL LOW (ref 38.4–49.9)
HEMOGLOBIN: 11 g/dL — AB (ref 13.0–17.1)
IMMATURE RETIC FRACT: 1.7 % — AB (ref 3.00–10.60)
LYMPH#: 0.5 10*3/uL — AB (ref 0.9–3.3)
LYMPH%: 9.3 % — ABNORMAL LOW (ref 14.0–49.0)
MCH: 30.1 pg (ref 27.2–33.4)
MCHC: 31.5 g/dL — ABNORMAL LOW (ref 32.0–36.0)
MCV: 95.6 fL (ref 79.3–98.0)
MONO#: 0.5 10*3/uL (ref 0.1–0.9)
MONO%: 8.7 % (ref 0.0–14.0)
NEUT#: 4.1 10*3/uL (ref 1.5–6.5)
NEUT%: 76.6 % — AB (ref 39.0–75.0)
Platelets: 273 10*3/uL (ref 140–400)
RBC: 3.65 10*6/uL — AB (ref 4.20–5.82)
RDW: 16.2 % — ABNORMAL HIGH (ref 11.0–14.6)
RETIC CT ABS: 45.26 10*3/uL (ref 34.80–93.90)
Retic %: 1.24 % (ref 0.80–1.80)
WBC: 5.4 10*3/uL (ref 4.0–10.3)

## 2017-04-24 LAB — MAGNESIUM: MAGNESIUM: 1.6 mg/dL (ref 1.5–2.5)

## 2017-04-24 MED ORDER — ACETAMINOPHEN 325 MG PO TABS
ORAL_TABLET | ORAL | Status: AC
  Filled 2017-04-24: qty 2

## 2017-04-24 MED ORDER — DEXAMETHASONE SODIUM PHOSPHATE 10 MG/ML IJ SOLN
INTRAMUSCULAR | Status: AC
  Filled 2017-04-24: qty 1

## 2017-04-24 MED ORDER — SODIUM CHLORIDE 0.9 % IV SOLN
70.0000 mg/m2 | Freq: Once | INTRAVENOUS | Status: AC
  Administered 2017-04-24: 125 mg via INTRAVENOUS
  Filled 2017-04-24: qty 5

## 2017-04-24 MED ORDER — PALONOSETRON HCL INJECTION 0.25 MG/5ML
0.2500 mg | Freq: Once | INTRAVENOUS | Status: AC
Start: 2017-04-24 — End: 2017-04-24
  Administered 2017-04-24: 0.25 mg via INTRAVENOUS

## 2017-04-24 MED ORDER — DEXAMETHASONE SODIUM PHOSPHATE 10 MG/ML IJ SOLN
10.0000 mg | Freq: Once | INTRAMUSCULAR | Status: AC
  Administered 2017-04-24: 10 mg via INTRAVENOUS

## 2017-04-24 MED ORDER — PALONOSETRON HCL INJECTION 0.25 MG/5ML
INTRAVENOUS | Status: AC
  Filled 2017-04-24: qty 5

## 2017-04-24 MED ORDER — SODIUM CHLORIDE 0.9 % IV SOLN
375.0000 mg/m2 | Freq: Once | INTRAVENOUS | Status: AC
  Administered 2017-04-24: 700 mg via INTRAVENOUS
  Filled 2017-04-24: qty 70

## 2017-04-24 MED ORDER — DIPHENHYDRAMINE HCL 25 MG PO CAPS
ORAL_CAPSULE | ORAL | Status: AC
  Filled 2017-04-24: qty 2

## 2017-04-24 MED ORDER — SODIUM CHLORIDE 0.9 % IV SOLN
Freq: Once | INTRAVENOUS | Status: AC
  Administered 2017-04-24: 11:00:00 via INTRAVENOUS

## 2017-04-24 MED ORDER — DIPHENHYDRAMINE HCL 25 MG PO CAPS
50.0000 mg | ORAL_CAPSULE | Freq: Once | ORAL | Status: AC
Start: 2017-04-24 — End: 2017-04-24
  Administered 2017-04-24: 50 mg via ORAL

## 2017-04-24 MED ORDER — ACETAMINOPHEN 325 MG PO TABS
650.0000 mg | ORAL_TABLET | Freq: Once | ORAL | Status: AC
  Administered 2017-04-24: 650 mg via ORAL

## 2017-04-24 NOTE — Progress Notes (Signed)
Nutrition follow-up completed with patient in his wife, during infusion for non-Hodgkin's lymphoma. Weight increased and documented as 163 pounds December 13. Patient reports swelling in hands and legs. He does not have much but taste for meat and complains of frequent gagging. Patient does enjoy other protein foods and understands its important for him to eat these often.  Nutrition diagnosis: Food and nutrition related knowledge deficit improved.  Intervention: Educated patient on additional high-protein foods. Provided coupons for boost plus, patient's preferred oral nutrition supplement. Teach back method used.  Monitoring, evaluation, goals: Patient will continue to tolerate increased calories and protein to maintain lean body mass and help with edema.  Next visit: To be scheduled as needed.  Patient and wife have my contact information.  **Disclaimer: This note was dictated with voice recognition software. Similar sounding words can inadvertently be transcribed and this note may contain transcription errors which may not have been corrected upon publication of note.**

## 2017-04-24 NOTE — Patient Instructions (Signed)
Thank you for choosing [facility] to provide your oncology and hematology care.  To afford each patient quality time with our providers, please arrive 30 minutes before your scheduled appointment time.  If you arrive late for your appointment, you may be asked to reschedule.  We strive to give you quality time with our providers, and arriving late affects you and other patients whose appointments are after yours.   If you are a no show for multiple scheduled visits, you may be dismissed from the clinic at the providers discretion.    Again, thank you for choosing [facility], our hope is that these requests will decrease the amount of time that you wait before being seen by our physicians.  ______________________________________________________________________  Should you have questions after your visit to the [facility], please contact our office at [phone] between the hours of 8:30 and 4:30 p.m.    Voicemails left after 4:30p.m will not be returned until the following business day.    For prescription refill requests, please have your pharmacy contact us directly.  Please also try to allow 48 hours for prescription requests.    Please contact the scheduling department for questions regarding scheduling.  For scheduling of procedures such as PET scans, CT scans, MRI, Ultrasound, etc please contact central scheduling at [phone].    Resources For Cancer Patients and Caregivers:   [employer]ink[employer]org:  A wonderful resource for patients and healthcare providers for information regarding your disease, ways to tract your treatment, what to expect, etc.     Central:  415-588-5294  Can help patients locate various types of support and financial assistance  Cancer Care: 1-800-813-HOPE 970-386-7078) Provides financial assistance, online support groups, medication/co-pay assistance.    [place]:  425-114-0838 Where to apply for food  stamps, Medicaid, and utility assistance  Medicare Rights Center: (445)538-2258 Helps people with Medicare understand their rights and benefits, navigate the Medicare system, and secure the quality healthcare they deserve  [employer]: [place] [employer]'s shared-ride transportation service for eligible riders who have a disability that prevents them from riding the fixed route bus.    For additional information on assistance programs please contact our social worker:   Sh[place]:  [phone]

## 2017-04-24 NOTE — Patient Instructions (Signed)
[facility] Discharge Instructions for Patients Receiving Chemotherapy  Today you received the following chemotherapy agents Rituximab, Bendeka.   To help prevent nausea and vomiting after your treatment, we encourage you to take your nausea medication as prescribed.    If you develop nausea and vomiting that is not controlled by your nausea medication, call the clinic.   BELOW ARE SYMPTOMS THAT SHOULD BE REPORTED IMMEDIATELY:  *FEVER GREATER THAN 100.5 F  *CHILLS WITH OR WITHOUT FEVER  NAUSEA AND VOMITING THAT IS NOT CONTROLLED WITH YOUR NAUSEA MEDICATION  *UNUSUAL SHORTNESS OF BREATH  *UNUSUAL BRUISING OR BLEEDING  TENDERNESS IN MOUTH AND THROAT WITH OR WITHOUT PRESENCE OF ULCERS  *URINARY PROBLEMS  *BOWEL PROBLEMS  UNUSUAL RASH Items with * indicate a potential emergency and should be followed up as soon as possible.  Feel free to call the clinic should you have any questions or concerns. The clinic phone number is [phone].  Please show the CHEMO ALERT CARD at check-in to the Emergency Department and triage nurse.   Rituximab injection What is this medicine? RITUXIMAB (ri TUX i mab) is a monoclonal antibody. It is used to treat certain types of cancer like non-Hodgkin lymphoma and chronic lymphocytic leukemia. It is also used to treat rheumatoid arthritis, granulomatosis with polyangiitis (or Wegener's granulomatosis), and microscopic polyangiitis. This medicine may be used for other purposes; ask your health care provider or pharmacist if you have questions. COMMON BRAND NAME(S): Rituxan What should I tell my health care provider before I take this medicine? They need to know if you have any of these conditions: -heart disease -infection (especially a virus infection such as hepatitis B, chickenpox, cold sores, or herpes) -immune system problems -irregular heartbeat -kidney disease -lung or breathing disease, like asthma -recently received or  scheduled to receive a vaccine -an unusual or allergic reaction to rituximab, mouse proteins, other medicines, foods, dyes, or preservatives -pregnant or trying to get pregnant -breast-feeding How should I use this medicine? This medicine is for infusion into a vein. It is administered in a hospital or clinic by a specially trained health care professional. A special MedGuide will be given to you by the pharmacist with each prescription and refill. Be sure to read this information carefully each time. Talk to your pediatrician regarding the use of this medicine in children. This medicine is not approved for use in children. Overdosage: If you think you have taken too much of this medicine contact a poison control center or emergency room at once. NOTE: This medicine is only for you. Do not share this medicine with others. What if I miss a dose? It is important not to miss a dose. Call your doctor or health care professional if you are unable to keep an appointment. What may interact with this medicine? -cisplatin -other medicines for arthritis like disease modifying antirheumatic drugs or tumor necrosis factor inhibitors -live virus vaccines This list may not describe all possible interactions. Give your health care provider a list of all the medicines, herbs, non-prescription drugs, or dietary supplements you use. Also tell them if you smoke, drink alcohol, or use illegal drugs. Some items may interact with your medicine. What should I watch for while using this medicine? Your condition will be monitored carefully while you are receiving this medicine. You may need blood work done while you are taking this medicine. This medicine can cause serious allergic reactions. To reduce your risk you may need to take medicine before treatment with this   medicine. Take your medicine as directed. In some patients, this medicine may cause a serious brain infection that may cause death. If you have any  problems seeing, thinking, speaking, walking, or standing, tell your doctor right away. If you cannot reach your doctor, urgently seek other source of medical care. Call your doctor or health care professional for advice if you get a fever, chills or sore throat, or other symptoms of a cold or flu. Do not treat yourself. This drug decreases your body's ability to fight infections. Try to avoid being around people who are sick. Do not become pregnant while taking this medicine or for 12 months after stopping it. Women should inform their doctor if they wish to become pregnant or think they might be pregnant. There is a potential for serious side effects to an unborn child. Talk to your health care professional or pharmacist for more information. What side effects may I notice from receiving this medicine? Side effects that you should report to your doctor or health care professional as soon as possible: -breathing problems -chest pain -dizziness or feeling faint -fast, irregular heartbeat -low blood counts - this medicine may decrease the number of white blood cells, red blood cells and platelets. You may be at increased risk for infections and bleeding. -mouth sores -redness, blistering, peeling or loosening of the skin, including inside the mouth (this can be added for any serious or exfoliative rash that could lead to hospitalization) -signs of infection - fever or chills, cough, sore throat, pain or difficulty passing urine -signs and symptoms of kidney injury like trouble passing urine or change in the amount of urine -signs and symptoms of liver injury like dark yellow or brown urine; general ill feeling or flu-like symptoms; light-colored stools; loss of appetite; nausea; right upper belly pain; unusually weak or tired; yellowing of the eyes or skin -stomach pain -vomiting Side effects that usually do not require medical attention (report to your doctor or health care professional if they  continue or are bothersome): -headache -joint pain -muscle cramps or muscle pain This list may not describe all possible side effects. Call your doctor for medical advice about side effects. You may report side effects to FDA at 1-800-FDA-1088. Where should I keep my medicine? This drug is given in a hospital or clinic and will not be stored at home. NOTE: This sheet is a summary. It may not cover all possible information. If you have questions about this medicine, talk to your doctor, pharmacist, or health care provider.  2018 Elsevier/Gold Standard (2015-12-06 15:28:09)   Bendamustine Injection What is this medicine? BENDAMUSTINE (BEN da MUS teen) is a chemotherapy drug. It is used to treat chronic lymphocytic leukemia and non-Hodgkin lymphoma. This medicine may be used for other purposes; ask your health care provider or pharmacist if you have questions. COMMON BRAND NAME(S): BENDEKA, Treanda What should I tell my health care provider before I take this medicine? They need to know if you have any of these conditions: -infection (especially a virus infection such as chickenpox, cold sores, or herpes) -kidney disease -liver disease -an unusual or allergic reaction to bendamustine, mannitol, other medicines, foods, dyes, or preservatives -pregnant or trying to get pregnant -breast-feeding How should I use this medicine? This medicine is for infusion into a vein. It is given by a health care professional in a hospital or clinic setting. Talk to your pediatrician regarding the use of this medicine in children. Special care may be needed. Overdosage: If you  think you have taken too much of this medicine contact a poison control center or emergency room at once. NOTE: This medicine is only for you. Do not share this medicine with others. What if I miss a dose? It is important not to miss your dose. Call your doctor or health care professional if you are unable to keep an appointment. What  may interact with this medicine? Do not take this medicine with any of the following medications: -clozapine This medicine may also interact with the following medications: -atazanavir -cimetidine -ciprofloxacin -enoxacin -fluvoxamine -medicines for seizures like carbamazepine and phenobarbital -mexiletine -rifampin -tacrine -thiabendazole -zileuton This list may not describe all possible interactions. Give your health care provider a list of all the medicines, herbs, non-prescription drugs, or dietary supplements you use. Also tell them if you smoke, drink alcohol, or use illegal drugs. Some items may interact with your medicine. What should I watch for while using this medicine? This drug may make you feel generally unwell. This is not uncommon, as chemotherapy can affect healthy cells as well as cancer cells. Report any side effects. Continue your course of treatment even though you feel ill unless your doctor tells you to stop. You may need blood work done while you are taking this medicine. Call your doctor or health care professional for advice if you get a fever, chills or sore throat, or other symptoms of a cold or flu. Do not treat yourself. This drug decreases your body's ability to fight infections. Try to avoid being around people who are sick. This medicine may increase your risk to bruise or bleed. Call your doctor or health care professional if you notice any unusual bleeding. Talk to your doctor about your risk of cancer. You may be more at risk for certain types of cancers if you take this medicine. Do not become pregnant while taking this medicine or for 3 months after stopping it. Women should inform their doctor if they wish to become pregnant or think they might be pregnant. Men should not father a child while taking this medicine and for 3 months after stopping it.There is a potential for serious side effects to an unborn child. Talk to your health care professional or  pharmacist for more information. Do not breast-feed an infant while taking this medicine. This medicine may interfere with the ability to have a child. You should talk with your doctor or health care professional if you are concerned about your fertility. What side effects may I notice from receiving this medicine? Side effects that you should report to your doctor or health care professional as soon as possible: -allergic reactions like skin rash, itching or hives, swelling of the face, lips, or tongue -low blood counts - this medicine may decrease the number of white blood cells, red blood cells and platelets. You may be at increased risk for infections and bleeding. -redness, blistering, peeling or loosening of the skin, including inside the mouth -signs of infection - fever or chills, cough, sore throat, pain or difficulty passing urine -signs of decreased platelets or bleeding - bruising, pinpoint red spots on the skin, black, tarry stools, blood in the urine -signs of decreased red blood cells - unusually weak or tired, fainting spells, lightheadedness -signs and symptoms of kidney injury like trouble passing urine or change in the amount of urine -signs and symptoms of liver injury like dark yellow or brown urine; general ill feeling or flu-like symptoms; light-colored stools; loss of appetite; nausea; right upper belly   pain; unusually weak or tired; yellowing of the eyes or skin Side effects that usually do not require medical attention (report to your doctor or health care professional if they continue or are bothersome): -constipation -decreased appetite -diarrhea -headache -mouth sores -nausea/vomiting -tiredness This list may not describe all possible side effects. Call your doctor for medical advice about side effects. You may report side effects to FDA at 1-800-FDA-1088. Where should I keep my medicine? This drug is given in a hospital or clinic and will not be stored at  home. NOTE: This sheet is a summary. It may not cover all possible information. If you have questions about this medicine, talk to your doctor, pharmacist, or health care provider.  2018 Elsevier/Gold Standard (2015-03-02 08:45:41)

## 2017-04-25 ENCOUNTER — Encounter (HOSPITAL_COMMUNITY): Payer: Self-pay | Admitting: Emergency Medicine

## 2017-04-25 ENCOUNTER — Emergency Department (HOSPITAL_COMMUNITY): Payer: Medicare Other

## 2017-04-25 ENCOUNTER — Ambulatory Visit: Payer: Medicare Other

## 2017-04-25 ENCOUNTER — Emergency Department (HOSPITAL_COMMUNITY)
Admission: EM | Admit: 2017-04-25 | Discharge: 2017-04-25 | Disposition: A | Discharge status: Home / Self Care | Payer: Medicare Other | Attending: Emergency Medicine | Admitting: Emergency Medicine

## 2017-04-25 DIAGNOSIS — I5043 Acute on chronic combined systolic (congestive) and diastolic (congestive) heart failure: Secondary | ICD-10-CM | POA: Insufficient documentation

## 2017-04-25 DIAGNOSIS — Z79899 Other long term (current) drug therapy: Secondary | ICD-10-CM | POA: Diagnosis not present

## 2017-04-25 DIAGNOSIS — Z87891 Personal history of nicotine dependence: Secondary | ICD-10-CM | POA: Insufficient documentation

## 2017-04-25 DIAGNOSIS — J449 Chronic obstructive pulmonary disease, unspecified: Secondary | ICD-10-CM | POA: Diagnosis not present

## 2017-04-25 DIAGNOSIS — L03314 Cellulitis of groin: Secondary | ICD-10-CM

## 2017-04-25 DIAGNOSIS — Z7901 Long term (current) use of anticoagulants: Secondary | ICD-10-CM | POA: Insufficient documentation

## 2017-04-25 DIAGNOSIS — Z85828 Personal history of other malignant neoplasm of skin: Secondary | ICD-10-CM | POA: Diagnosis not present

## 2017-04-25 DIAGNOSIS — Z8546 Personal history of malignant neoplasm of prostate: Secondary | ICD-10-CM | POA: Diagnosis not present

## 2017-04-25 DIAGNOSIS — R600 Localized edema: Secondary | ICD-10-CM | POA: Diagnosis present

## 2017-04-25 DIAGNOSIS — R109 Unspecified abdominal pain: Secondary | ICD-10-CM | POA: Diagnosis not present

## 2017-04-25 LAB — COMPREHENSIVE METABOLIC PANEL
ALT: 15 U/L — ABNORMAL LOW (ref 17–63)
ANION GAP: 7 (ref 5–15)
AST: 26 U/L (ref 15–41)
Albumin: 1.9 g/dL — ABNORMAL LOW (ref 3.5–5.0)
Alkaline Phosphatase: 302 U/L — ABNORMAL HIGH (ref 38–126)
BUN: 32 mg/dL — ABNORMAL HIGH (ref 6–20)
CHLORIDE: 98 mmol/L — AB (ref 101–111)
CO2: 30 mmol/L (ref 22–32)
CREATININE: 1.21 mg/dL (ref 0.61–1.24)
Calcium: 8.4 mg/dL — ABNORMAL LOW (ref 8.9–10.3)
GFR, EST NON AFRICAN AMERICAN: 58 mL/min — AB (ref >60)
Glucose, Bld: 101 mg/dL — ABNORMAL HIGH (ref 65–99)
POTASSIUM: 4.2 mmol/L (ref 3.5–5.1)
SODIUM: 135 mmol/L (ref 135–145)
Total Bilirubin: 0.3 mg/dL (ref 0.3–1.2)
Total Protein: 5.3 g/dL — ABNORMAL LOW (ref 6.5–8.1)

## 2017-04-25 LAB — CBC WITH DIFFERENTIAL/PLATELET
Basophils Absolute: 0 10*3/uL (ref 0.0–0.1)
Basophils Relative: 0 %
EOS ABS: 0 10*3/uL (ref 0.0–0.7)
Eosinophils Relative: 1 %
HEMATOCRIT: 34.8 % — AB (ref 39.0–52.0)
HEMOGLOBIN: 11.4 g/dL — AB (ref 13.0–17.0)
LYMPHS ABS: 0.6 10*3/uL — AB (ref 0.7–4.0)
LYMPHS PCT: 7 %
MCH: 31 pg (ref 26.0–34.0)
MCHC: 32.8 g/dL (ref 30.0–36.0)
MCV: 94.6 fL (ref 78.0–100.0)
Monocytes Absolute: 0.3 10*3/uL (ref 0.1–1.0)
Monocytes Relative: 3 %
NEUTROS ABS: 7.6 10*3/uL (ref 1.7–7.7)
NEUTROS PCT: 89 %
Platelets: 281 10*3/uL (ref 150–400)
RBC: 3.68 MIL/uL — AB (ref 4.22–5.81)
RDW: 16.6 % — ABNORMAL HIGH (ref 11.5–15.5)
WBC: 8.6 10*3/uL (ref 4.0–10.5)

## 2017-04-25 LAB — PHOSPHORUS: PHOSPHORUS: 3.3 mg/dL (ref 2.5–4.5)

## 2017-04-25 LAB — I-STAT CG4 LACTIC ACID, ED: LACTIC ACID, VENOUS: 1.58 mmol/L (ref 0.5–1.9)

## 2017-04-25 MED ORDER — VANCOMYCIN HCL IN DEXTROSE 1-5 GM/200ML-% IV SOLN
1000.0000 mg | Freq: Once | INTRAVENOUS | Status: AC
  Administered 2017-04-25: 1000 mg via INTRAVENOUS
  Filled 2017-04-25: qty 200

## 2017-04-25 MED ORDER — MORPHINE SULFATE (PF) 4 MG/ML IV SOLN
4.0000 mg | Freq: Once | INTRAVENOUS | Status: AC
  Administered 2017-04-25: 4 mg via INTRAVENOUS
  Filled 2017-04-25: qty 1

## 2017-04-25 MED ORDER — IOPAMIDOL (ISOVUE-300) INJECTION 61%
100.0000 mL | Freq: Once | INTRAVENOUS | Status: AC | PRN
  Administered 2017-04-25: 100 mL via INTRAVENOUS

## 2017-04-25 MED ORDER — PIPERACILLIN-TAZOBACTAM 3.375 G IVPB 30 MIN
3.3750 g | Freq: Once | INTRAVENOUS | Status: AC
  Administered 2017-04-25: 3.375 g via INTRAVENOUS
  Filled 2017-04-25: qty 50

## 2017-04-25 MED ORDER — HYDROCODONE-ACETAMINOPHEN 5-325 MG PO TABS
2.0000 | ORAL_TABLET | Freq: Once | ORAL | Status: AC
  Administered 2017-04-25: 2 via ORAL
  Filled 2017-04-25: qty 2

## 2017-04-25 MED ORDER — IOPAMIDOL (ISOVUE-300) INJECTION 61%
INTRAVENOUS | Status: AC
  Filled 2017-04-25: qty 100

## 2017-04-25 MED ORDER — DOXYCYCLINE HYCLATE 100 MG PO CAPS: 100.0000 mg | ORAL_CAPSULE | Freq: Two times a day (BID) | ORAL | 0 refills | Status: DC

## 2017-04-25 MED ORDER — METRONIDAZOLE IN NACL 5-0.79 MG/ML-% IV SOLN
500.0000 mg | Freq: Once | INTRAVENOUS | Status: AC
  Administered 2017-04-25: 500 mg via INTRAVENOUS
  Filled 2017-04-25: qty 100

## 2017-04-25 NOTE — ED Notes (Signed)
Patient transported to CT 

## 2017-04-25 NOTE — Progress Notes (Signed)
A consult was received from an ED physician for Zosyn per pharmacy dosing.  The patient's profile has been reviewed for ht/wt/allergies/indication/available labs.   A one time order has been placed for Zosyn 3.375g.  Further antibiotics/pharmacy consults should be ordered by admitting physician if indicated.                       Thank you, Kara Mead 04/25/2017  1:30 PM

## 2017-04-25 NOTE — Progress Notes (Signed)
Pt came in today c/o weeping around testicles causing sticking to undergarments. Additionally, area has become painful, red, and edematous. Dr. Irene Limbo in infusion to see patient. Concerned that this may be an anaerobic infection that could progress to gangrene at the rate that it progressed. Pt was seen by MD in the clinic yesterday and no c/o this symptom. Pain 6/10 per pt explanation. Report given to [facility], RN in the ED. Transferred to Rm 10. No treatment today.

## 2017-04-25 NOTE — ED Notes (Signed)
Patient spouse states that their ride home is currently in movie and will be about hour or so before they can come get patient.  Made Charge RN, MATT, aware about delay in discharge.

## 2017-04-25 NOTE — Care Management Note (Signed)
Case Management Note  Patient Details  Name: [name] MRN: [id number] Date of Birth: 31-Jul-1945  CM noted pt was active with [facility].  Confirmed with Adacia pt has RN PT NA through [facility].  Expected Discharge Date:    Unknown              Expected Discharge Plan:  [place]  Post Acute Care Choice:  Home Health  HH Arranged:  PT, Nurse's Aide, RN [facility] Agency:  [facility]  Status of Service:  In process, will continue to follow  Rae Mar, RN 04/25/2017, 2:42 PM

## 2017-04-25 NOTE — ED Notes (Signed)
ED Provider at bedside. EDP ALLEN

## 2017-04-25 NOTE — ED Notes (Signed)
2ND SET BLOOD CULTURE OBTAINED 5ML EACH  RT FOREARM. PT TOLERATED

## 2017-04-25 NOTE — ED Triage Notes (Signed)
Pt brought over from [facility] for increased swelling, erythema and pain to groin and testicles since yesterday with weeping.  Patient reports that pain is worse with movement due to sticking to underwear.  Patient wasn't able to get chemo treatment today, as afraid infection spreading so rapidly, patient needs antibiotics and possible urology consult.

## 2017-04-25 NOTE — Discharge Instructions (Signed)
Return here once for fever or increased redness to your groin

## 2017-04-25 NOTE — ED Provider Notes (Signed)
[facility] DEPT Provider Note   CSN: [id number] Arrival date & time: 04/25/17  1254     History   Chief Complaint Chief Complaint  Patient presents with  . Groin Swelling    HPI [name] is a 71 y.o. male.  71 year old male presents with 1 day history of progressing swelling and erythema to his left perineum.  Denies any urinary symptoms.  No reported fever or chills.  Severe pain characterized as sharp and worse with any movement.  Patient currently being treated for lymphoma last chemotherapy was a week ago.  Was at his cancer doctor today and sent here for further management.  No treatment given prior to arrival      Past Medical History:  Diagnosis Date  . Anxiety   . Arthritis    "neck" (08/15/2015)  . Chronic bronchitis ([place])   . Chronic diastolic CHF (congestive heart failure) ([place])   . Constipation   . COPD (chronic obstructive pulmonary disease) ([place])   . DDD (degenerative disc disease), cervical   . Depression   . Dilated aortic root ([place])    6m on echo 05/2016  . Edema of left lower extremity   . Facial basal cell cancer   . H/O acne vulgaris 1960s   [facility]  . Headache    history of - left temporal- years ago- not current (08/15/2015)  . Persistent atrial fibrillation (HCC)    on coumadin with CHADS2VASC score of 2  . Pneumonia 1960s; 2015 X 2  . Prostate cancer (HGlenarden dx'd early 2000s   "low spreading; non aggressive type" (08/15/2015)  . Pulmonary HTN (HLake Ozark    PASP 42mg by echo 05/2016  . Skin cancer    "back"    Patient Active Problem List   Diagnosis Date Noted  . Right-sided thoracic back pain 03/26/2017  . Insomnia 03/26/2017  . Lymphedema of left arm 03/26/2017  . Malignant lymphoplasmacytic lymphoma (HCSun Valley11/04/2017  . Counseling regarding advanced care planning and goals of care 03/24/2017  . Pain management contract signed 03/10/2017  . Chronic  generalized pain disorder 03/04/2017  . Light chain (AL) amyloidosis (HCC)   . Nephrotic syndrome   . Proteinuria   . Dyspnea   . Left arm swelling   . Acute on chronic diastolic CHF (congestive heart failure) (HCNucla  . Elevated brain natriuretic peptide (BNP) level   . Chronic diastolic heart failure (HCHublersburg10/04/2017  . Hypotension 02/21/2017  . Fluid overload 02/21/2017  . Mediastinal lymphadenopathy 02/10/2017  . Adjustment disorder with depressed mood 12/03/2016  . Bacteremia due to Streptococcus pneumoniae 11/19/2016  . Sepsis (HCEmery07/02/2017  . Hypoalbuminemia 11/19/2016  . Right leg pain 11/19/2016  . AKI (acute kidney injury) (HCGlen Aubrey07/02/2017  . Acute urinary retention 11/19/2016  . Hyponatremia 11/18/2016  . Pneumonia 11/18/2016  . HCAP (healthcare-associated pneumonia) 11/18/2016  . History of prostate cancer 11/15/2016  . Chronic post-operative pain 11/15/2016  . Persistent atrial fibrillation (HCMachesney Park  . Acute on chronic combined systolic and diastolic CHF (congestive heart failure) (HCRendon  . Pulmonary HTN (HCCastle Valley  . Dilated aortic root (HCDawson  . Septic shock (HCZephyrhills South  . Pedal edema   . Acute respiratory failure with hypoxia (HCMason Neck01/01/2017  . Lung mass 05/21/2016  . Loculated pleural effusion 05/21/2016  . Empyema (HCPierre01/01/2017  . S/P thoracentesis   . Bilateral pleural effusion 05/14/2016  . COPD mixed  type ([place]) 05/14/2016  . Tobacco abuse 05/14/2016  . Protein-calorie malnutrition, severe ([place]) 05/14/2016    Past Surgical History:  Procedure Laterality Date  . CARDIOVERSION N/A 10/11/2016   Procedure: CARDIOVERSION;  Surgeon: Sanda Klein, MD;  Location: [facility];  Service: Cardiovascular;  Laterality: N/A;  . CARDIOVERSION N/A 02/26/2017   Procedure: CARDIOVERSION;  Surgeon: [name], MD;  Location[facility] ENDOSCOPY;  Service: Cardiovascular;  Laterality: N/A;  . COLONOSCOPY    . EXCISIONAL HEMORRHOIDECTOMY  1960s  . INGUINAL HERNIA REPAIR  Right 2002  . INGUINAL HERNIA REPAIR Bilateral 08/15/2015  . INGUINAL HERNIA REPAIR Bilateral 08/15/2015   Procedure: OPEN REPAIR RECURRENT RIGHT INGUINAL HERNIA WITH MESH AND REPAIR LEFT INGUINAL HERNIA WITH MESH;  Surgeon: [name], MD;  Location: [facility];  Service: General;  Laterality: Bilateral;  . INSERTION OF MESH Bilateral 08/15/2015   Procedure: INSERTION OF MESH;  Surgeon: [name], MD;  Location: [facility];  Service: General;  Laterality: Bilateral;  . IR THORACENTESIS ASP PLEURAL SPACE W/IMG GUIDE  11/18/2016  . LYMPH NODE BIOPSY Bilateral 10/22/2016   Procedure: LEFT NECK LYMPH NODE BIOPSY;  Surgeon: [name], MD;  Location: [facility];  Service: Thoracic;  Laterality: Bilateral;  . PROSTATE BIOPSY    . TONSILLECTOMY  1950s  . VIDEO ASSISTED THORACOSCOPY (VATS)/EMPYEMA Right 05/20/2016   Procedure: VIDEO ASSISTED THORACOSCOPY (VATS) with drainage of pleural effusion;  Surgeon: [name], MD;  Location: [facility];  Service: Thoracic;  Laterality: Right;  [name] VIDEO BRONCHOSCOPY WITH ENDOBRONCHIAL ULTRASOUND Right 05/20/2016   Procedure: VIDEO BRONCHOSCOPY WITH ENDOBRONCHIAL ULTRASOUND;  Surgeon: [name], MD;  Location: [facility];  Service: Thoracic;  Laterality: Right;       Home Medications    Prior to Admission medications   Medication Sig Start Date End Date Taking? Authorizing Provider  acyclovir (ZOVIRAX) 400 MG tablet Take 1 tablet (400 mg total) daily by mouth. 03/24/17   Brunetta Genera, MD  dexamethasone (DECADRON) 4 MG tablet Take 2 tablets (8 mg total) daily by mouth. Start the day after bendamustine chemotherapy for 2 days. Take with food. 03/24/17   Brunetta Genera, MD  diazepam (VALIUM) 5 MG tablet 1 by mouth prior to MRI 04/10/17   Biagio Borg, MD  docusate sodium (COLACE) 100 MG capsule Take 1-3 capsules (100-300 mg total) by mouth daily. 03/04/17   Binnie Rail, MD  feeding supplement (BOOST / RESOURCE BREEZE) LIQD Take 1 Container by mouth 3  (three) times daily between meals. 03/02/17   Kayleen Memos, DO  furosemide (LASIX) 40 MG tablet Take 1 tablet (40 mg total) 2 (two) times daily by mouth. 40 mg BID for 5 days then 40 mg a day 03/18/17   [name], MD  HYDROcodone-acetaminophen [facility]) 10-325 MG tablet Take 1 tablet every 6 (six) hours as needed by mouth. 03/31/17   Biagio Borg, MD  hydrocortisone cream 1 % Apply 1 application topically 4 (four) times daily as needed for itching. 03/02/17   Kayleen Memos, DO  midodrine (PROAMATINE) 5 MG tablet Take 1 tablet (5 mg total) by mouth 3 (three) times daily with meals. 03/12/17   Burtis Junes, NP  mirtazapine (REMERON) 30 MG tablet Take 1 tablet (30 mg total) at bedtime by mouth. 03/26/17   Biagio Borg, MD  ondansetron (ZOFRAN) 8 MG tablet Take 1 tablet (8 mg total) 2 (two) times daily as needed by mouth for refractory nausea / vomiting.  Start on day 2 after bendamustine chemo. 03/24/17   Brunetta Genera, MD  polyethylene glycol [facility] / Floria Raveling) packet Take 17 g by mouth daily as needed. 12/30/16   Nche, Charlene Brooke, NP  prochlorperazine (COMPAZINE) 10 MG tablet Take 1 tablet (10 mg total) every 6 (six) hours as needed by mouth (Nausea or vomiting). 03/24/17   Brunetta Genera, MD  rivaroxaban (XARELTO) 20 MG TABS tablet Take 1 tablet (20 mg total) by mouth daily with supper. 01/14/17   [name], MD  senna-docusate (SENNA S) 8.6-50 MG tablet Take 2 tablets by mouth 2 (two) times daily. Reduce to 2 tab po HS once constipation resolved 04/02/17   Brunetta Genera, MD    Family History Family History  Problem Relation Age of Onset  . Colon cancer Mother   . Ovarian cancer Mother   . Alcoholism Father   . CAD Father   . Alcohol abuse Father   . Ovarian cancer Sister   . Diabetes Neg Hx   . Stomach cancer Neg Hx     Social History Social History   Tobacco Use  . Smoking status: Former Smoker    Packs/day: 1.00    Years: 62.00    Pack years:  62.00    Types: Cigarettes    Last attempt to quit: 05/14/2016    Years since quitting: 0.9  . Smokeless tobacco: Never Used  Substance Use Topics  . Alcohol use: No    Alcohol/week: 0.0 oz  . Drug use: No    Comment: CBD OIL     Allergies   Demerol [meperidine] and Oxycodone hcl   Review of Systems Review of Systems  All other systems reviewed and are negative.    Physical Exam Updated Vital Signs BP 96/62 (BP Location: Right Arm)   Pulse 60   Temp (!) 97.3 F (36.3 C) (Oral)   Resp 19   Ht 1.803 m (_0 )   Wt 70.8 kg (156 lb)   SpO2 95%   BMI 21.76 kg/m   Physical Exam  Constitutional: He is oriented to person, place, and time. He appears well-developed and well-nourished.  Non-toxic appearance. No distress.  HENT:  Head: Normocephalic and atraumatic.  Eyes: Conjunctivae, EOM and lids are normal. Pupils are equal, round, and reactive to light.  Neck: Normal range of motion. Neck supple. No tracheal deviation present. No thyroid mass present.  Cardiovascular: Normal rate, regular rhythm and normal heart sounds. Exam reveals no gallop.  No murmur heard. Pulmonary/Chest: Effort normal and breath sounds normal. No stridor. No respiratory distress. He has no decreased breath sounds. He has no wheezes. He has no rhonchi. He has no rales.  Abdominal: Soft. Normal appearance and bowel sounds are normal. He exhibits no distension. There is no tenderness. There is no rebound and no CVA tenderness.  Genitourinary:     Musculoskeletal: Normal range of motion. He exhibits no edema or tenderness.  Neurological: He is alert and oriented to person, place, and time. He has normal strength. No cranial nerve deficit or sensory deficit. GCS eye subscore is 4. GCS verbal subscore is 5. GCS motor subscore is 6.  Skin: Skin is warm and dry. No abrasion and no rash noted.  Psychiatric: He has a normal mood and affect. His speech is normal and behavior is normal.  Nursing note and  vitals reviewed.    ED Treatments / Results  Labs (all labs ordered are listed, but only abnormal results are displayed)  Labs Reviewed  CBC WITH DIFFERENTIAL/PLATELET - Abnormal; Notable for the following components:      Result Value   RBC 3.68 (*)    Hemoglobin 11.4 (*)    HCT 34.8 (*)    RDW 16.6 (*)    Lymphs Abs 0.6 (*)    All other components within normal limits  CULTURE, BLOOD (ROUTINE X 2)  CULTURE, BLOOD (ROUTINE X 2)  COMPREHENSIVE METABOLIC PANEL  URINALYSIS, ROUTINE W REFLEX MICROSCOPIC  I-STAT CG4 LACTIC ACID, ED    EKG  EKG Interpretation None       Radiology No results found.  Procedures Procedures (including critical care time)  Medications Ordered in ED Medications  piperacillin-tazobactam (ZOSYN) IVPB 3.375 g (not administered)     Initial Impression / Assessment and Plan / ED Course  I have reviewed the triage vital signs and the nursing notes.  Pertinent labs & imaging results that were available during my care of the patient were reviewed by me and considered in my medical decision making (see chart for details).    Patient given empiric antibiotics due to concern for pelvic infection.  Pelvic CT without evidence of deep process.  Patient's groin reexamined and no drainable wounds noted.  Suspect that patient may have superficial cellulitis.  Will place patient on antibiotics and strict return precautions given  Final Clinical Impressions(s) / ED Diagnoses   Final diagnoses:  None    ED Discharge Orders    None       Lacretia Leigh, MD 04/25/17 1723

## 2017-04-25 NOTE — ED Notes (Signed)
Bed: SL37 Expected date:  Expected time:  Means of arrival:  Comments: Pt from cancer center, red swollen scrotum

## 2017-04-26 DIAGNOSIS — J449 Chronic obstructive pulmonary disease, unspecified: Secondary | ICD-10-CM | POA: Diagnosis not present

## 2017-04-28 ENCOUNTER — Telehealth (HOSPITAL_COMMUNITY): Payer: Self-pay | Admitting: Cardiology

## 2017-04-28 DIAGNOSIS — I739 Peripheral vascular disease, unspecified: Secondary | ICD-10-CM | POA: Diagnosis not present

## 2017-04-28 DIAGNOSIS — I272 Pulmonary hypertension, unspecified: Secondary | ICD-10-CM | POA: Diagnosis not present

## 2017-04-28 DIAGNOSIS — I481 Persistent atrial fibrillation: Secondary | ICD-10-CM | POA: Diagnosis not present

## 2017-04-28 DIAGNOSIS — I110 Hypertensive heart disease with heart failure: Secondary | ICD-10-CM | POA: Diagnosis not present

## 2017-04-28 DIAGNOSIS — M50922 Unspecified cervical disc disorder at C5-C6 level: Secondary | ICD-10-CM | POA: Diagnosis not present

## 2017-04-28 DIAGNOSIS — D649 Anemia, unspecified: Secondary | ICD-10-CM | POA: Diagnosis not present

## 2017-04-28 DIAGNOSIS — I5042 Chronic combined systolic (congestive) and diastolic (congestive) heart failure: Secondary | ICD-10-CM | POA: Diagnosis not present

## 2017-04-28 DIAGNOSIS — R918 Other nonspecific abnormal finding of lung field: Secondary | ICD-10-CM | POA: Diagnosis not present

## 2017-04-28 DIAGNOSIS — J439 Emphysema, unspecified: Secondary | ICD-10-CM | POA: Diagnosis not present

## 2017-04-28 DIAGNOSIS — J9611 Chronic respiratory failure with hypoxia: Secondary | ICD-10-CM | POA: Diagnosis not present

## 2017-04-28 DIAGNOSIS — G8929 Other chronic pain: Secondary | ICD-10-CM | POA: Diagnosis not present

## 2017-04-28 DIAGNOSIS — M1612 Unilateral primary osteoarthritis, left hip: Secondary | ICD-10-CM | POA: Diagnosis not present

## 2017-04-28 NOTE — Telephone Encounter (Signed)
HHPT called to report they are unable to wraps legs for unna boots as ordered per Dr Aundra Dubin,  As patient does not have an open wound/sores. They cannot provide a compression wrapping for edema however then can  Do ACE wraps.   [facility] [phone]  Please advise further

## 2017-04-28 NOTE — Telephone Encounter (Signed)
Do ace wraps if they can't do Unna boots.

## 2017-04-29 DIAGNOSIS — I5042 Chronic combined systolic (congestive) and diastolic (congestive) heart failure: Secondary | ICD-10-CM | POA: Diagnosis not present

## 2017-04-29 DIAGNOSIS — R918 Other nonspecific abnormal finding of lung field: Secondary | ICD-10-CM | POA: Diagnosis not present

## 2017-04-29 DIAGNOSIS — I481 Persistent atrial fibrillation: Secondary | ICD-10-CM | POA: Diagnosis not present

## 2017-04-29 DIAGNOSIS — I272 Pulmonary hypertension, unspecified: Secondary | ICD-10-CM | POA: Diagnosis not present

## 2017-04-29 DIAGNOSIS — I739 Peripheral vascular disease, unspecified: Secondary | ICD-10-CM | POA: Diagnosis not present

## 2017-04-29 DIAGNOSIS — D649 Anemia, unspecified: Secondary | ICD-10-CM | POA: Diagnosis not present

## 2017-04-29 DIAGNOSIS — J9611 Chronic respiratory failure with hypoxia: Secondary | ICD-10-CM | POA: Diagnosis not present

## 2017-04-29 DIAGNOSIS — M50922 Unspecified cervical disc disorder at C5-C6 level: Secondary | ICD-10-CM | POA: Diagnosis not present

## 2017-04-29 DIAGNOSIS — J439 Emphysema, unspecified: Secondary | ICD-10-CM | POA: Diagnosis not present

## 2017-04-29 DIAGNOSIS — I110 Hypertensive heart disease with heart failure: Secondary | ICD-10-CM | POA: Diagnosis not present

## 2017-04-29 DIAGNOSIS — G8929 Other chronic pain: Secondary | ICD-10-CM | POA: Diagnosis not present

## 2017-04-29 DIAGNOSIS — M1612 Unilateral primary osteoarthritis, left hip: Secondary | ICD-10-CM | POA: Diagnosis not present

## 2017-04-29 NOTE — Telephone Encounter (Signed)
Left VM that they can do Ace wraps on Pt.

## 2017-04-30 ENCOUNTER — Other Ambulatory Visit: Payer: Self-pay

## 2017-04-30 ENCOUNTER — Inpatient Hospital Stay (HOSPITAL_COMMUNITY)
Admission: EM | Admit: 2017-04-30 | Discharge: 2017-05-12 | DRG: 291 | Disposition: A | Discharge status: Home Health | Payer: Medicare Other | Attending: Internal Medicine | Admitting: Internal Medicine

## 2017-04-30 ENCOUNTER — Inpatient Hospital Stay (HOSPITAL_COMMUNITY): Payer: Medicare Other

## 2017-04-30 ENCOUNTER — Encounter (HOSPITAL_COMMUNITY): Payer: Self-pay

## 2017-04-30 ENCOUNTER — Emergency Department (HOSPITAL_COMMUNITY): Payer: Medicare Other

## 2017-04-30 DIAGNOSIS — K5900 Constipation, unspecified: Secondary | ICD-10-CM | POA: Diagnosis not present

## 2017-04-30 DIAGNOSIS — E8581 Light chain (AL) amyloidosis: Secondary | ICD-10-CM | POA: Diagnosis present

## 2017-04-30 DIAGNOSIS — I509 Heart failure, unspecified: Secondary | ICD-10-CM

## 2017-04-30 DIAGNOSIS — Z6822 Body mass index (BMI) 22.0-22.9, adult: Secondary | ICD-10-CM | POA: Diagnosis not present

## 2017-04-30 DIAGNOSIS — I5033 Acute on chronic diastolic (congestive) heart failure: Secondary | ICD-10-CM | POA: Diagnosis not present

## 2017-04-30 DIAGNOSIS — J90 Pleural effusion, not elsewhere classified: Secondary | ICD-10-CM | POA: Diagnosis not present

## 2017-04-30 DIAGNOSIS — I472 Ventricular tachycardia: Secondary | ICD-10-CM | POA: Diagnosis not present

## 2017-04-30 DIAGNOSIS — Z515 Encounter for palliative care: Secondary | ICD-10-CM | POA: Diagnosis present

## 2017-04-30 DIAGNOSIS — I951 Orthostatic hypotension: Secondary | ICD-10-CM | POA: Diagnosis present

## 2017-04-30 DIAGNOSIS — E43 Unspecified severe protein-calorie malnutrition: Secondary | ICD-10-CM | POA: Diagnosis present

## 2017-04-30 DIAGNOSIS — F419 Anxiety disorder, unspecified: Secondary | ICD-10-CM | POA: Diagnosis present

## 2017-04-30 DIAGNOSIS — Z8546 Personal history of malignant neoplasm of prostate: Secondary | ICD-10-CM | POA: Diagnosis not present

## 2017-04-30 DIAGNOSIS — J449 Chronic obstructive pulmonary disease, unspecified: Secondary | ICD-10-CM | POA: Diagnosis not present

## 2017-04-30 DIAGNOSIS — L03314 Cellulitis of groin: Secondary | ICD-10-CM | POA: Diagnosis present

## 2017-04-30 DIAGNOSIS — K602 Anal fissure, unspecified: Secondary | ICD-10-CM | POA: Diagnosis present

## 2017-04-30 DIAGNOSIS — C8300 Small cell B-cell lymphoma, unspecified site: Secondary | ICD-10-CM | POA: Diagnosis not present

## 2017-04-30 DIAGNOSIS — L8990 Pressure ulcer of unspecified site, unspecified stage: Secondary | ICD-10-CM

## 2017-04-30 DIAGNOSIS — R404 Transient alteration of awareness: Secondary | ICD-10-CM | POA: Diagnosis not present

## 2017-04-30 DIAGNOSIS — I481 Persistent atrial fibrillation: Secondary | ICD-10-CM | POA: Diagnosis present

## 2017-04-30 DIAGNOSIS — R0602 Shortness of breath: Secondary | ICD-10-CM | POA: Diagnosis not present

## 2017-04-30 DIAGNOSIS — I959 Hypotension, unspecified: Secondary | ICD-10-CM | POA: Diagnosis not present

## 2017-04-30 DIAGNOSIS — I248 Other forms of acute ischemic heart disease: Secondary | ICD-10-CM | POA: Diagnosis not present

## 2017-04-30 DIAGNOSIS — Z7189 Other specified counseling: Secondary | ICD-10-CM | POA: Diagnosis not present

## 2017-04-30 DIAGNOSIS — E440 Moderate protein-calorie malnutrition: Secondary | ICD-10-CM | POA: Diagnosis not present

## 2017-04-30 DIAGNOSIS — I361 Nonrheumatic tricuspid (valve) insufficiency: Secondary | ICD-10-CM | POA: Diagnosis not present

## 2017-04-30 DIAGNOSIS — I110 Hypertensive heart disease with heart failure: Principal | ICD-10-CM | POA: Diagnosis present

## 2017-04-30 DIAGNOSIS — J9611 Chronic respiratory failure with hypoxia: Secondary | ICD-10-CM | POA: Diagnosis not present

## 2017-04-30 DIAGNOSIS — R55 Syncope and collapse: Secondary | ICD-10-CM | POA: Diagnosis not present

## 2017-04-30 DIAGNOSIS — Z87891 Personal history of nicotine dependence: Secondary | ICD-10-CM

## 2017-04-30 DIAGNOSIS — Z9981 Dependence on supplemental oxygen: Secondary | ICD-10-CM

## 2017-04-30 DIAGNOSIS — Z85828 Personal history of other malignant neoplasm of skin: Secondary | ICD-10-CM

## 2017-04-30 DIAGNOSIS — R531 Weakness: Secondary | ICD-10-CM | POA: Diagnosis not present

## 2017-04-30 DIAGNOSIS — I77810 Thoracic aortic ectasia: Secondary | ICD-10-CM | POA: Diagnosis not present

## 2017-04-30 DIAGNOSIS — F329 Major depressive disorder, single episode, unspecified: Secondary | ICD-10-CM | POA: Diagnosis present

## 2017-04-30 DIAGNOSIS — I272 Pulmonary hypertension, unspecified: Secondary | ICD-10-CM | POA: Diagnosis not present

## 2017-04-30 DIAGNOSIS — Z79899 Other long term (current) drug therapy: Secondary | ICD-10-CM

## 2017-04-30 DIAGNOSIS — I4819 Other persistent atrial fibrillation: Secondary | ICD-10-CM | POA: Diagnosis present

## 2017-04-30 DIAGNOSIS — Z7901 Long term (current) use of anticoagulants: Secondary | ICD-10-CM

## 2017-04-30 DIAGNOSIS — N049 Nephrotic syndrome with unspecified morphologic changes: Secondary | ICD-10-CM | POA: Diagnosis present

## 2017-04-30 DIAGNOSIS — R0600 Dyspnea, unspecified: Secondary | ICD-10-CM

## 2017-04-30 DIAGNOSIS — M549 Dorsalgia, unspecified: Secondary | ICD-10-CM | POA: Diagnosis present

## 2017-04-30 DIAGNOSIS — J9601 Acute respiratory failure with hypoxia: Secondary | ICD-10-CM | POA: Diagnosis not present

## 2017-04-30 DIAGNOSIS — I5032 Chronic diastolic (congestive) heart failure: Secondary | ICD-10-CM | POA: Diagnosis not present

## 2017-04-30 DIAGNOSIS — G8929 Other chronic pain: Secondary | ICD-10-CM | POA: Diagnosis present

## 2017-04-30 DIAGNOSIS — E876 Hypokalemia: Secondary | ICD-10-CM | POA: Diagnosis not present

## 2017-04-30 DIAGNOSIS — D630 Anemia in neoplastic disease: Secondary | ICD-10-CM | POA: Diagnosis present

## 2017-04-30 DIAGNOSIS — R601 Generalized edema: Secondary | ICD-10-CM | POA: Diagnosis not present

## 2017-04-30 LAB — CBC
HCT: 32.7 % — ABNORMAL LOW (ref 39.0–52.0)
Hemoglobin: 10.8 g/dL — ABNORMAL LOW (ref 13.0–17.0)
MCH: 31 pg (ref 26.0–34.0)
MCHC: 33 g/dL (ref 30.0–36.0)
MCV: 94 fL (ref 78.0–100.0)
PLATELETS: 309 10*3/uL (ref 150–400)
RBC: 3.48 MIL/uL — AB (ref 4.22–5.81)
RDW: 16.4 % — AB (ref 11.5–15.5)
WBC: 3.1 10*3/uL — AB (ref 4.0–10.5)

## 2017-04-30 LAB — BASIC METABOLIC PANEL
Anion gap: 5 (ref 5–15)
BUN: 34 mg/dL — AB (ref 6–20)
CALCIUM: 7.8 mg/dL — AB (ref 8.9–10.3)
CHLORIDE: 100 mmol/L — AB (ref 101–111)
CO2: 30 mmol/L (ref 22–32)
CREATININE: 1.06 mg/dL (ref 0.61–1.24)
GFR calc Af Amer: >60 mL/min (ref >60)
Glucose, Bld: 96 mg/dL (ref 65–99)
Potassium: 4.1 mmol/L (ref 3.5–5.1)
SODIUM: 135 mmol/L (ref 135–145)

## 2017-04-30 LAB — CULTURE, BLOOD (ROUTINE X 2)
CULTURE: NO GROWTH
Culture: NO GROWTH
Special Requests: ADEQUATE
Special Requests: ADEQUATE

## 2017-04-30 LAB — BRAIN NATRIURETIC PEPTIDE: B Natriuretic Peptide: 680 pg/mL — ABNORMAL HIGH (ref 0.0–100.0)

## 2017-04-30 LAB — URINALYSIS, ROUTINE W REFLEX MICROSCOPIC
Bilirubin Urine: NEGATIVE
GLUCOSE, UA: NEGATIVE mg/dL
Hgb urine dipstick: NEGATIVE
KETONES UR: NEGATIVE mg/dL
Leukocytes, UA: NEGATIVE
NITRITE: NEGATIVE
PH: 7 (ref 5.0–8.0)
Protein, ur: 100 mg/dL — AB
SPECIFIC GRAVITY, URINE: 1.012 (ref 1.005–1.030)

## 2017-04-30 LAB — TROPONIN I: Troponin I: 0.08 ng/mL (ref <0.03)

## 2017-04-30 LAB — CBG MONITORING, ED: GLUCOSE-CAPILLARY: 79 mg/dL (ref 65–99)

## 2017-04-30 LAB — POC OCCULT BLOOD, ED: Fecal Occult Bld: POSITIVE — AB

## 2017-04-30 MED ORDER — FUROSEMIDE 10 MG/ML IJ SOLN
60.0000 mg | Freq: Once | INTRAMUSCULAR | Status: AC
  Administered 2017-04-30: 60 mg via INTRAVENOUS
  Filled 2017-04-30: qty 8

## 2017-04-30 MED ORDER — ALBUTEROL SULFATE (2.5 MG/3ML) 0.083% IN NEBU: 2.5000 mg | INHALATION_SOLUTION | RESPIRATORY_TRACT | Status: DC | PRN

## 2017-04-30 MED ORDER — GADOBENATE DIMEGLUMINE 529 MG/ML IV SOLN
20.0000 mL | Freq: Once | INTRAVENOUS | Status: AC | PRN
  Administered 2017-04-30: 14 mL via INTRAVENOUS

## 2017-04-30 MED ORDER — ONDANSETRON HCL 4 MG/2ML IJ SOLN
4.0000 mg | Freq: Four times a day (QID) | INTRAMUSCULAR | Status: DC | PRN
  Filled 2017-04-30: qty 2

## 2017-04-30 MED ORDER — MORPHINE SULFATE (PF) 4 MG/ML IV SOLN
1.0000 mg | INTRAVENOUS | Status: DC | PRN
  Administered 2017-04-30 – 2017-05-12 (×24): 1 mg via INTRAVENOUS
  Filled 2017-04-30 (×26): qty 1

## 2017-04-30 MED ORDER — SODIUM CHLORIDE 0.9% FLUSH
3.0000 mL | Freq: Two times a day (BID) | INTRAVENOUS | Status: DC
  Administered 2017-04-30 – 2017-05-12 (×21): 3 mL via INTRAVENOUS

## 2017-04-30 MED ORDER — DOCUSATE SODIUM 100 MG PO CAPS
100.0000 mg | ORAL_CAPSULE | Freq: Every day | ORAL | Status: DC
  Administered 2017-04-30 – 2017-05-03 (×4): 100 mg via ORAL
  Administered 2017-05-04: 300 mg via ORAL
  Administered 2017-05-05 – 2017-05-09 (×4): 100 mg via ORAL
  Administered 2017-05-10: 300 mg via ORAL
  Administered 2017-05-11 – 2017-05-12 (×2): 100 mg via ORAL
  Filled 2017-04-30 (×13): qty 1

## 2017-04-30 MED ORDER — ACYCLOVIR 400 MG PO TABS
400.0000 mg | ORAL_TABLET | Freq: Every day | ORAL | Status: DC
  Administered 2017-04-30 – 2017-05-12 (×13): 400 mg via ORAL
  Filled 2017-04-30 (×13): qty 1

## 2017-04-30 MED ORDER — SENNOSIDES-DOCUSATE SODIUM 8.6-50 MG PO TABS
2.0000 | ORAL_TABLET | Freq: Two times a day (BID) | ORAL | Status: DC
  Administered 2017-04-30 – 2017-05-12 (×21): 2 via ORAL
  Filled 2017-04-30 (×27): qty 2

## 2017-04-30 MED ORDER — ACETAMINOPHEN 325 MG PO TABS: 650.0000 mg | ORAL_TABLET | ORAL | Status: DC | PRN

## 2017-04-30 MED ORDER — SODIUM CHLORIDE 0.9% FLUSH: 3.0000 mL | INTRAVENOUS | Status: DC | PRN

## 2017-04-30 MED ORDER — SODIUM CHLORIDE 0.9 % IV SOLN: 250.0000 mL | INTRAVENOUS | Status: DC | PRN

## 2017-04-30 MED ORDER — MORPHINE SULFATE (PF) 4 MG/ML IV SOLN
2.0000 mg | Freq: Once | INTRAVENOUS | Status: AC
  Administered 2017-04-30: 2 mg via INTRAVENOUS
  Filled 2017-04-30: qty 1

## 2017-04-30 MED ORDER — ZOLPIDEM TARTRATE 5 MG PO TABS
5.0000 mg | ORAL_TABLET | Freq: Every evening | ORAL | Status: DC | PRN
  Administered 2017-04-30: 5 mg via ORAL
  Filled 2017-04-30: qty 1

## 2017-04-30 MED ORDER — HYDROCODONE-ACETAMINOPHEN 10-325 MG PO TABS
1.0000 | ORAL_TABLET | Freq: Four times a day (QID) | ORAL | Status: DC | PRN
  Administered 2017-04-30: 1 via ORAL
  Filled 2017-04-30: qty 1

## 2017-04-30 MED ORDER — MIDODRINE HCL 5 MG PO TABS
5.0000 mg | ORAL_TABLET | Freq: Three times a day (TID) | ORAL | Status: DC
  Administered 2017-05-01: 5 mg via ORAL
  Filled 2017-04-30 (×3): qty 1

## 2017-04-30 MED ORDER — DOXYCYCLINE HYCLATE 100 MG PO TABS
100.0000 mg | ORAL_TABLET | Freq: Two times a day (BID) | ORAL | Status: AC
  Administered 2017-04-30 – 2017-05-02 (×5): 100 mg via ORAL
  Filled 2017-04-30 (×5): qty 1

## 2017-04-30 MED ORDER — RIVAROXABAN 20 MG PO TABS
20.0000 mg | ORAL_TABLET | Freq: Every day | ORAL | Status: DC
  Administered 2017-04-30 – 2017-05-11 (×12): 20 mg via ORAL
  Filled 2017-04-30 (×12): qty 1

## 2017-04-30 MED ORDER — HYDROCODONE-ACETAMINOPHEN 10-325 MG PO TABS
1.0000 | ORAL_TABLET | ORAL | Status: DC | PRN
  Administered 2017-04-30 – 2017-05-01 (×2): 1 via ORAL
  Filled 2017-04-30 (×2): qty 1

## 2017-04-30 MED ORDER — FUROSEMIDE 10 MG/ML PO SOLN
40.0000 mg | Freq: Two times a day (BID) | ORAL | Status: DC
  Administered 2017-05-01: 40 mg via ORAL
  Filled 2017-04-30 (×2): qty 4
  Filled 2017-04-30: qty 5

## 2017-04-30 MED ORDER — BOOST / RESOURCE BREEZE PO LIQD CUSTOM
1.0000 | Freq: Three times a day (TID) | ORAL | Status: DC
  Administered 2017-05-01 – 2017-05-07 (×15): 1 via ORAL
  Filled 2017-04-30: qty 1

## 2017-04-30 NOTE — ED Notes (Signed)
Bed: IR48 Expected date:  Expected time:  Means of arrival:  Comments: Colonoscopy..will be back

## 2017-04-30 NOTE — ED Notes (Signed)
Patient transported to MRI 

## 2017-04-30 NOTE — Progress Notes (Signed)
Writer helped pt out of bed to bedside commode with help of another RN. While pt was standing up to sit on the bedside commode pt stopped talking and his eyes rolled back and became limp. This Probation officer and other RN brought pt to bed without incident. Pt did not fall. For approximately two minutes pt was unconscious. Writer felt pulse on pt and pt became alert and oriented X4. Rapid response called and up to assess pt. On call [place] notified of incident. Will continue to monitor pt closely.

## 2017-04-30 NOTE — ED Notes (Signed)
Assigned room 1445 _0 

## 2017-04-30 NOTE — ED Triage Notes (Signed)
Patient arrive via [facility] from home. Has experienced near syncopal episodes in the past. Patient did pass out today. Boarded line hypotension (86-96). Pt has weakness and lack of appetite which is his baseline. Patient has lymphoma. Patient had his second chemo treatment last week but did not finish it. Edema present in his extremities. Hx. Of CHF. Sinus brady. Pt is bed bound at home but, wife gets him up in chair and takes care of him per [facility].

## 2017-04-30 NOTE — H&P (Signed)
History and Physical    [name] NFA:[id number] DOB: July 06, 1945 DOA: 04/30/2017  Referring MD/NP/PA:   PCP: Biagio Borg, MD   Patient coming from:  The patient is coming from home.  At baseline, pt is independent for most of ADL   Chief Complaint: Syncope, oxygen desaturation  HPI: [name] is a 72 y.o. male with medical history significant of COPD on 2 L nasal cannula oxygen, dCHF, prostate cancer, depression, anxiety, skin cancer, lymphoma on chemotherapy, cellulitis in groin area, a fib on Xarelto, who presents with syncope.  The patient's family, patient has been feeling weak and has poor appetite recently.  He had 2 episodes of syncope yesterday and today.  Today he passed out for about 2 minutes while he was urinating.  No seizure like activity.  Per family, patient did not injure his head or neck.  He does not have head and neck pain.  He does not have unilateral weakness, numbness or tingling to the extremities.  No vision change, slurred speech, hearing loss.  Patient denies any symptoms of shortness of breath, but he was found to have oxygen desaturation to 70s-80s on room air when he is laying down, improved to 90% when he is sitting up.  He denies chest pain or cough.  No fever or chills. Per his wife, patient has very tiny amount of blood in stool when he wipes.  He has been treated for groin cellulitis with doxycycline in the past 2 days.  Patient does not have nausea, vomiting, diarrhea or abdominal pain.  Denies symptoms of UTI.  ED Course: pt was found to have WBC 3.1, BNP 680, positive FOBT, creatinine 1.04, temperature normal, no tachycardia, oxygen saturation 100% on room air, chest x-ray showed CHF.  Patient is admitted to telemetry bed as inpatient.  Review of Systems:   General: no fevers, chills, no body weight gain, has poor appetite, has fatigue HEENT: no blurry vision, hearing changes or sore throat Respiratory: no dyspnea, coughing, wheezing CV:  no chest pain, no palpitations GI: no nausea, vomiting, abdominal pain, diarrhea, constipation GU: no dysuria, burning on urination, increased urinary frequency, hematuria  Ext: has leg edema Neuro: no unilateral weakness, numbness, or tingling, no vision change or hearing loss. Has syncope. Skin: no rash, no skin tear. MSK: No muscle spasm, no deformity, no limitation of range of movement in spin Heme: No easy bruising.  Travel history: No recent long distant travel.  Allergy:  Allergies  Allergen Reactions  . Demerol [Meperidine] Other (See Comments)    UNSPECIFIED REACTION  Causes system to shutdown. ?   . Oxycodone Hcl Other (See Comments)    Pt states this medication 'wires him up' and makes pt hyper; pt does not want to take this ever again    Past Medical History:  Diagnosis Date  . Anxiety   . Arthritis    "neck" (08/15/2015)  . Chronic bronchitis ([place])   . Chronic diastolic CHF (congestive heart failure) ([place])   . Constipation   . COPD (chronic obstructive pulmonary disease) ([place])   . DDD (degenerative disc disease), cervical   . Depression   . Dilated aortic root ([place])    78m on echo 05/2016  . Edema of left lower extremity   . Facial basal cell cancer   . H/O acne vulgaris 1960s   [facility]"  . Headache    history of - left temporal- years ago-  not current (08/15/2015)  . Persistent atrial fibrillation (HCC)    on coumadin with CHADS2VASC score of 2  . Pneumonia 1960s; 2015 X 2  . Prostate cancer ([place]) dx'd early 2000s   "low spreading; non aggressive type" (08/15/2015)  . Pulmonary HTN ([place])    PASP 31mHg by echo 05/2016  . Skin cancer    "back"    Past Surgical History:  Procedure Laterality Date  . CARDIOVERSION N/A 10/11/2016   Procedure: CARDIOVERSION;  Surgeon: CSanda Klein MD;  [facility];  Service: Cardiovascular;  Laterality: N/A;  . CARDIOVERSION N/A 02/26/2017   Procedure: CARDIOVERSION;   Surgeon: MLarey Dresser MD;  [facility];  Service: Cardiovascular;  Laterality: N/A;  . COLONOSCOPY    . EXCISIONAL HEMORRHOIDECTOMY  1960s  . INGUINAL HERNIA REPAIR Right 2002  . INGUINAL HERNIA REPAIR Bilateral 08/15/2015  . INGUINAL HERNIA REPAIR Bilateral 08/15/2015   Procedure: OPEN REPAIR RECURRENT RIGHT INGUINAL HERNIA WITH MESH AND REPAIR LEFT INGUINAL HERNIA WITH MESH;  Surgeon: HFanny Skates MD;  [place] [facility]  Service: General;  Laterality: Bilateral;  . INSERTION OF MESH Bilateral 08/15/2015   Procedure: INSERTION OF MESH;  Surgeon: HFanny Skates MD;  [place]  Service: General;  Laterality: Bilateral;  . IR THORACENTESIS ASP PLEURAL SPACE W/IMG GUIDE  11/18/2016  . LYMPH NODE BIOPSY Bilateral 10/22/2016   Procedure: LEFT NECK LYMPH NODE BIOPSY;  Surgeon: VIvin Poot MD;  [facility] [facility]  Service: Thoracic;  Laterality: Bilateral;  . PROSTATE BIOPSY    . TONSILLECTOMY  1950s  . VIDEO ASSISTED THORACOSCOPY (VATS)/EMPYEMA Right 05/20/2016   Procedure: VIDEO ASSISTED THORACOSCOPY (VATS) with drainage of pleural effusion;  Surgeon: PIvin Poot MD;  [place] [facility]  Service: Thoracic;  Laterality: Right;  .Marland KitchenVIDEO BRONCHOSCOPY WITH ENDOBRONCHIAL ULTRASOUND Right 05/20/2016   Procedure: VIDEO BRONCHOSCOPY WITH ENDOBRONCHIAL ULTRASOUND;  Surgeon: PIvin Poot MD;  [facility];  Service: Thoracic;  Laterality: Right;    Social History:  reports that he quit smoking about a year ago. His smoking use included cigarettes. He has a 62.00 pack-year smoking history. he has never used smokeless tobacco. He reports that he does not drink alcohol or use drugs.  Family History:  Family History  Problem Relation Age of Onset  . Colon cancer Mother   . Ovarian cancer Mother   . Alcoholism Father   . CAD Father   . Alcohol abuse Father   . Ovarian cancer Sister   . Diabetes Neg Hx   . Stomach cancer Neg Hx      Prior to Admission medications     Medication Sig Start Date End Date Taking? Authorizing Provider  acyclovir (ZOVIRAX) 400 MG tablet Take 1 tablet (400 mg total) daily by mouth. 03/24/17  Yes KBrunetta Genera MD  docusate sodium (COLACE) 100 MG capsule Take 1-3 capsules (100-300 mg total) by mouth daily. 03/04/17  Yes Burns, SClaudina Lick MD  doxycycline (VIBRAMYCIN) 100 MG capsule Take 1 capsule (100 mg total) by mouth 2 (two) times daily. 04/25/17  Yes ALacretia Leigh MD  feeding supplement (BOOST / RESOURCE BREEZE) LIQD Take 1 Container by mouth 3 (three) times daily between meals. 03/02/17  Yes HKayleen Memos DO  furosemide (LASIX) 40 MG tablet Take 1 tablet (40 mg total) 2 (two) times daily by mouth. 40 mg BID for 5 days then 40 mg a day 03/18/17  Yes Turner, TEber Hong MD  HYDROcodone-acetaminophen (NORCO) 10-325 MG tablet Take  1 tablet every 6 (six) hours as needed by mouth. 03/31/17  Yes Biagio Borg, MD  midodrine (PROAMATINE) 5 MG tablet Take 1 tablet (5 mg total) by mouth 3 (three) times daily with meals. 03/12/17  Yes Burtis Junes, NP  rivaroxaban (XARELTO) 20 MG TABS tablet Take 1 tablet (20 mg total) by mouth daily with supper. 01/14/17  Yes Turner, Eber Hong, MD  senna-docusate (SENNA S) 8.6-50 MG tablet Take 2 tablets by mouth 2 (two) times daily. Reduce to 2 tab po HS once constipation resolved 04/02/17  Yes Brunetta Genera, MD  dexamethasone (DECADRON) 4 MG tablet Take 2 tablets (8 mg total) daily by mouth. Start the day after bendamustine chemotherapy for 2 days. Take with food. Patient not taking: Reported on 04/30/2017 03/24/17   Brunetta Genera, MD  diazepam (VALIUM) 5 MG tablet 1 by mouth prior to MRI Patient not taking: Reported on 04/25/2017 04/10/17   Biagio Borg, MD  hydrocortisone cream 1 % Apply 1 application topically 4 (four) times daily as needed for itching. Patient not taking: Reported on 04/25/2017 03/02/17   Kayleen Memos, DO  mirtazapine (REMERON) 30 MG tablet Take 1 tablet (30 mg  total) at bedtime by mouth. Patient not taking: Reported on 04/25/2017 03/26/17   Biagio Borg, MD  ondansetron (ZOFRAN) 8 MG tablet Take 1 tablet (8 mg total) 2 (two) times daily as needed by mouth for refractory nausea / vomiting. Start on day 2 after bendamustine chemo. Patient not taking: Reported on 04/25/2017 03/24/17   Brunetta Genera, MD  polyethylene glycol [facility] / Floria Raveling) packet Take 17 g by mouth daily as needed. Patient not taking: Reported on 04/25/2017 12/30/16   Nche, Charlene Brooke, NP  prochlorperazine (COMPAZINE) 10 MG tablet Take 1 tablet (10 mg total) every 6 (six) hours as needed by mouth (Nausea or vomiting). Patient not taking: Reported on 04/25/2017 03/24/17   Brunetta Genera, MD    Physical Exam: Vitals:   04/30/17 1615 04/30/17 1730 04/30/17 1800 04/30/17 1900  BP: 96/68 104/65 97/67 107/69  Pulse: 61 85  83  Resp: _0 Temp:      TempSrc:      SpO2: 100% 100%  99%   General: Not in acute distress HEENT:       Eyes: PERRL, EOMI, no scleral icterus.       ENT: No discharge from the ears and nose, no pharynx injection, no tonsillar enlargement.        Neck: positive JVD, no bruit, no mass felt. Heme: No neck lymph node enlargement. Cardiac: S1/S2, RRR, No murmurs, No gallops or rubs. Respiratory:  No rales, wheezing, rhonchi or rubs. GI: Soft, nondistended, nontender, no rebound pain, no organomegaly, BS present. GU: No hematuria Ext: 3+ pitting leg edema bilaterally. 2+DP/PT pulse bilaterally. Musculoskeletal: No joint deformities, No joint redness or warmth, no limitation of ROM in spin. Skin: No rashes.  Neuro: Alert, oriented X3, cranial nerves II-XII grossly intact, moves all extremities normally.  Psych: Patient is not psychotic, no suicidal or hemocidal ideation.  Labs on Admission: I have personally reviewed following labs and imaging studies  CBC: Recent Labs  Lab 04/24/17 0830 04/25/17 1308 04/30/17 1327  WBC 5.4 8.6  3.1*  NEUTROABS 4.1 7.6  --   HGB 11.0* 11.4* 10.8*  HCT 34.9* 34.8* 32.7*  MCV 95.6 94.6 94.0  PLT 273 281 416   Basic Metabolic Panel: Recent Labs  Lab 04/24/17 0830  04/25/17 1308 04/30/17 1327  NA 136 135 135  K 4.0 4.2 4.1  CL  --  98* 100*  CO2 _0 GLUCOSE 94 101* 96  BUN 27.9* 32* 34*  CREATININE 1.0 1.21 1.06  CALCIUM 8.3* 8.4* 7.8*  MG 1.6  --   --   PHOS 3.3  --   --    GFR: Estimated Creatinine Clearance: 62.2 mL/min (by C-G formula based on SCr of 1.06 mg/dL). Liver Function Tests: Recent Labs  Lab 04/24/17 0830 04/25/17 1308  AST 19 26  ALT 14 15*  ALKPHOS 341* 302*  BILITOT 0.42 0.3  PROT 5.2* 5.3*  ALBUMIN 1.5* 1.9*   No results for input(s): LIPASE, AMYLASE in the last 168 hours. No results for input(s): AMMONIA in the last 168 hours. Coagulation Profile: No results for input(s): INR, PROTIME in the last 168 hours. Cardiac Enzymes: Recent Labs  Lab 04/30/17 1806  TROPONINI 0.08*   BNP (last 3 results) No results for input(s): PROBNP in the last 8760 hours. HbA1C: No results for input(s): HGBA1C in the last 72 hours. CBG: Recent Labs  Lab 04/30/17 1318  GLUCAP 79   Lipid Profile: No results for input(s): CHOL, HDL, LDLCALC, TRIG, CHOLHDL, LDLDIRECT in the last 72 hours. Thyroid Function Tests: No results for input(s): TSH, T4TOTAL, FREET4, T3FREE, THYROIDAB in the last 72 hours. Anemia Panel: No results for input(s): VITAMINB12, FOLATE, FERRITIN, TIBC, IRON, RETICCTPCT in the last 72 hours. Urine analysis:    Component Value Date/Time   COLORURINE YELLOW 04/30/2017 1720   APPEARANCEUR CLEAR 04/30/2017 1720   LABSPEC 1.012 04/30/2017 1720   PHURINE 7.0 04/30/2017 1720   GLUCOSEU NEGATIVE 04/30/2017 1720   HGBUR NEGATIVE 04/30/2017 1720   BILIRUBINUR NEGATIVE 04/30/2017 1720   KETONESUR NEGATIVE 04/30/2017 1720   PROTEINUR 100 (A) 04/30/2017 1720   NITRITE NEGATIVE 04/30/2017 1720   LEUKOCYTESUR NEGATIVE 04/30/2017 1720    Sepsis Labs: _1 (procalcitonin:4,lacticidven:4) ) Recent Results (from the past 240 hour(s))  Blood culture (routine x 2)     Status: None   Collection Time: 04/25/17  1:00 PM  Result Value Ref Range Status   Specimen Description BLOOD RIGHT ANTECUBITAL  Final   Special Requests   Final    BOTTLES DRAWN AEROBIC AND ANAEROBIC Blood Culture adequate volume   Culture   Final    NO GROWTH 5 DAYS Performed at [facility], [place] [address] [place], [place] [address]    Report Status 04/30/2017 FINAL  Final  Blood culture (routine x 2)     Status: None   Collection Time: 04/25/17  1:25 PM  Result Value Ref Range Status   Specimen Description BLOOD RIGHT FOREARM  Final   Special Requests   Final    BOTTLES DRAWN AEROBIC AND ANAEROBIC Blood Culture adequate volume   Culture   Final    NO GROWTH 5 DAYS Performed at [facility], [place] [address], [place], [place] [address]    Report Status 04/30/2017 FINAL  Final     Radiological Exams on Admission: Dg Chest 2 View  Result Date: 04/30/2017 CLINICAL DATA:  Back pain, body aches, near syncopal and syncopal episodes, hypotension. Patient has lymphoma and underwent second chemotherapy treatment last week but did not completed. Peripheral edema. History of CHF. EXAM: CHEST  2 VIEW COMPARISON:  Chest x-ray dated February 25, 2017 FINDINGS: The lungs are adequately inflated. There is a moderate-sized right pleural effusion which has increased slightly since the  previous study. There is a tiny pleural effusion layering posteriorly. The cardiac silhouette is enlarged. The pulmonary vascularity is engorged. The interstitial markings are increased. The mediastinum is normal in width. IMPRESSION: Findings compatible with congestive heart failure with interstitial edema and a moderate size right pleural effusion. Thoracic aortic atherosclerosis. Electronically Signed   By: David  Martinique M.D.   On: 04/30/2017 14:44     EKG:  Independently reviewed.  Atrial fibrillation, QTC 464, right axis deviation, mild T wave inversion in inferior leads and precordial leads.  Assessment/Plan Principal Problem:   Acute on chronic diastolic (congestive) heart failure (HCC) Active Problems:   Protein-calorie malnutrition, severe (HCC)   Persistent atrial fibrillation (HCC)   Malignant lymphoplasmacytic lymphoma (HCC)   Syncope and collapse   Acute on chronic diastolic [facility]congestive) heart failure [facility] Patient does not have shortness breath or chest pain, but was found to have oxygen desaturation.  BNP is elevated, patient also has 3+ leg edema and positive JVD, clinically consistent with CHF exacerbation.  -will admit to tele bed as inpt. -Lasix 40 mg bid by IV (patient received 1 dose of Lasix 60 mg in ED) -trop x 3 -2d echo -Daily weights  Protein-calorie malnutrition, severe ([place]): -Nutrition consult  Atrial Fibrillation: CHA2DS2-VASc Score is 2, needs oral anticoagulation. Patient is on Xarelto at home. [name] Heart rate is well controlled. -continue Xarelto  Malignant lymphoplasmacytic lymphoma ([place]): on chemotherapy, last week had segment dose of chemotherapy, but could not complete the treatment. -Patient follow with [facility]  Syncope and collapse: Etiology is not clear.  Familt's description seems to be vasovagal, it happened when patient was urinating today, but will need to rule out other possibilities given history of atrial fibrillation and lymphoma -MRI of the brain, 2D echo, carotid Doppler  Cellulitis in groin area: No fever or leukocytosis. -continue doxycycline  COPD: No wheezing on auscultation -PRN albuterol nebulizers   DVT ppx: on Xarelto Code Status: Full code Family Communication: Yes, patient's wife and duaghtet at bed side Disposition Plan:  Anticipate discharge back to previous home environment Consults called:  none Admission status:   Inpatient/tele     Date of Service 04/30/2017     Ivor Costa [facility] Pager [phone]  If 7PM-7AM, please contact night-coverage [email or web address] Password [facility] 04/30/2017, 8:17 PM

## 2017-04-30 NOTE — ED Notes (Signed)
Date and time results received: 04/30/17 7:03 PM  (use smartphrase ".now" to insert current time)  Test: Troponin Critical Value:0.09  Name of Provider Notified: Rolla Plate, RN  Orders Received? Or Actions Taken?:

## 2017-04-30 NOTE — Care Management Note (Signed)
Case Management Note  Patient Details  Name: [name] MRN: [id number] Date of Birth: Jun 24, 1945  CM noted pt's return.  Contacted Ad[place] with [facility] to advise her that pt was being admitted. CM will follow on inpt unit as needed.  Expected Discharge Date:  (unknown)               Expected Discharge Plan:  [place]  Post Acute Care Choice:  Home Health Choice offered to:  Patient  HH Arranged:  RN, PT, Nurse's Aide Page Agency:  [facility]  Status of Service:  In process, will continue to follow  Rae Mar, RN 04/30/2017, 5:48 PM

## 2017-04-30 NOTE — ED Provider Notes (Signed)
[facility] DEPT Provider Note   CSN: [id number] Arrival date & time: 04/30/17  1152     History   Chief Complaint Chief Complaint  Patient presents with  . Near Syncope    Patient passed out all the way.     HPI [name] is a 71 y.o. male past medical history of CHF, COPD, lymphoma who presents for evaluation of syncope, generalized weakness, and fatigue.  Wife reports that patient had 2 syncopal episodes since yesterday.  She states that both were witnessed and that patient did not his head.  Wife reports that today, patient seemed more fatigued and weak than normal.  She states that she had got him up to go the bathroom and noticed that when he was sitting upright, his O2 sat dropped.  She was ago put him back on the bed when they got to the bed, he had a new syncopal episode where he fell back onto the bed.  Wife reports that syncopal episode lasted approximately 1 minute before patient came to.  Wife reports the patient has not been eating for the last 2 days.  Patient also has a history of CHF and chronic bilateral lower extremity edema.  He notes that he has been having some swelling to bilateral lower extremities but states that it is slightly improved.  Wife also reports that he was recently treated for an infection in his scrotum.  Patient reports generalized malaise.  Wife denies fever, vomiting, cough  The history is provided by the patient and the spouse.    Past Medical History:  Diagnosis Date  . Anxiety   . Arthritis    "neck" (08/15/2015)  . Chronic bronchitis ([place])   . Chronic diastolic CHF (congestive heart failure) ([place])   . Constipation   . COPD (chronic obstructive pulmonary disease) ([place])   . DDD (degenerative disc disease), cervical   . Depression   . Dilated aortic root ([place])    84m on echo 05/2016  . Edema of left lower extremity   . Facial basal cell cancer   . H/O acne vulgaris 1960s   [facility]  . Headache    history of - left temporal- years ago- not current (08/15/2015)  . Persistent atrial fibrillation (HCC)    on coumadin with CHADS2VASC score of 2  . Pneumonia 1960s; 2015 X 2  . Prostate cancer ([place] dx[place] early 2000s   "low spreading; non aggressive type" (08/15/2015)  . Pulmonary HTN ([place]    PASP 448mg by echo 05/2016  . Skin cancer    "back"    Patient Active Problem List   Diagnosis Date Noted  . Right-sided thoracic back pain 03/26/2017  . Insomnia 03/26/2017  . Lymphedema of left arm 03/26/2017  . 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 Counseling regarding advanced care planning and goals of care 03/24/2017  . Pain management contract signed 03/10/2017  . Chronic generalized pain disorder 03/04/2017  . Light chain (AL) amyloidosis (HCC)   . Nephrotic syndrome   . Proteinuria   . Dyspnea   . Left arm swelling   . Acute on chronic diastolic CHF (congestive heart failure) ([place]  . Elevated brain natriuretic peptide (BNP) level   . Chronic diastolic heart failure (H[place]10/04/2017  . Hypotension 02/21/2017  . Fluid overload 02/21/2017  . Mediastinal lymphadenopathy 02/10/2017  . Adjustment disorder with depressed mood 12/03/2016  . Bacteremia due to  Streptococcus pneumoniae 11/19/2016  . Sepsis ([place]) 11/19/2016  . Hypoalbuminemia 11/19/2016  . Right leg pain 11/19/2016  . AKI (acute kidney injury) ([place]) 11/19/2016  . Acute urinary retention 11/19/2016  . Hyponatremia 11/18/2016  . Pneumonia 11/18/2016  . HCAP (healthcare-associated pneumonia) 11/18/2016  . History of prostate cancer 11/15/2016  . Chronic post-operative pain 11/15/2016  . Persistent atrial fibrillation ([place])   . Acute on chronic combined systolic and diastolic CHF (congestive heart failure) ([place])   . Pulmonary HTN ([place])   . Dilated aortic root ([place])   . Septic shock ([place])   . Pedal edema   . Acute respiratory failure with hypoxia ([place])  05/21/2016  . Lung mass 05/21/2016  . Loculated pleural effusion 05/21/2016  . Empyema ([place]) 05/21/2016  . S/P thoracentesis   . Bilateral pleural effusion 05/14/2016  . COPD mixed type ([place]) 05/14/2016  . Tobacco abuse 05/14/2016  . Protein-calorie malnutrition, severe ([place]) 05/14/2016    Past Surgical History:  Procedure Laterality Date  . CARDIOVERSION N/A 10/11/2016   Procedure: CARDIOVERSION;  Surgeon: Sanda Klein, MD;  Location: [facility] ENDOSCOPY;  Service: Cardiovascular;  Laterality: N/A;  . CARDIOVERSION N/A 02/26/2017   Procedure: CARDIOVERSION;  Surgeon: [name], MD;  Location[facility]COPY;  Service: Cardiovascular;  Laterality: N/A;  . COLONOSCOPY    . EXCISIONAL HEMORRHOIDECTOMY  1960s  . INGUINAL HERNIA REPAIR Right 2002  . INGUINAL HERNIA REPAIR Bilateral 08/15/2015  . INGUINAL HERNIA REPAIR Bilateral 08/15/2015   Procedure: OPEN REPAIR RECURRENT RIGHT INGUINAL HERNIA WITH MESH AND REPAIR LEFT INGUINAL HERNIA WITH MESH;  Surgeon: [name], MD;  Location: [facility];  Service: General;  Laterality: Bilateral;  . INSERTION OF MESH Bilateral 08/15/2015   Procedure: INSERTION OF MESH;  Surgeon: [name], MD;  Location: [facility];  Service: General;  Laterality: Bilateral;  . IR THORACENTESIS ASP PLEURAL SPACE W/IMG GUIDE  11/18/2016  . LYMPH NODE BIOPSY Bilateral 10/22/2016   Procedure: LEFT NECK LYMPH NODE BIOPSY;  Surgeon: [name], MD;  Location: [facility];  Service: Thoracic;  Laterality: Bilateral;  . PROSTATE BIOPSY    . TONSILLECTOMY  1950s  . VIDEO ASSISTED THORACOSCOPY (VATS)/EMPYEMA Right 05/20/2016   Procedure: VIDEO ASSISTED THORACOSCOPY (VATS) with drainage of pleural effusion;  Surgeon: [name], MD;  Location: [facility];  Service: Thoracic;  Laterality: Right;  [name] VIDEO BRONCHOSCOPY WITH ENDOBRONCHIAL ULTRASOUND Right 05/20/2016   Procedure: VIDEO BRONCHOSCOPY WITH ENDOBRONCHIAL ULTRASOUND;  Surgeon: [name], MD;  Location: [facility];  Service:  Thoracic;  Laterality: Right;       Home Medications    Prior to Admission medications   Medication Sig Start Date End Date Taking? Authorizing Provider  acyclovir (ZOVIRAX) 400 MG tablet Take 1 tablet (400 mg total) daily by mouth. 03/24/17  Yes Brunetta Genera, MD  docusate sodium (COLACE) 100 MG capsule Take 1-3 capsules (100-300 mg total) by mouth daily. 03/04/17  Yes Burns, Claudina Lick, MD  doxycycline (VIBRAMYCIN) 100 MG capsule Take 1 capsule (100 mg total) by mouth 2 (two) times daily. 04/25/17  Yes Lacretia Leigh, MD  feeding supplement (BOOST / RESOURCE BREEZE) LIQD Take 1 Container by mouth 3 (three) times daily between meals. 03/02/17  Yes Kayleen Memos, DO  furosemide (LASIX) 40 MG tablet Take 1 tablet (40 mg total) 2 (two) times daily by mouth. 40 mg BID for 5 days then 40 mg a day 03/18/17  Yes Turner, Eber Hong, MD  HYDROcodone-acetaminophen (NORCO) 10-325 MG tablet Take  1 tablet every 6 (six) hours as needed by mouth. 03/31/17  Yes Biagio Borg, MD  midodrine (PROAMATINE) 5 MG tablet Take 1 tablet (5 mg total) by mouth 3 (three) times daily with meals. 03/12/17  Yes Burtis Junes, NP  rivaroxaban (XARELTO) 20 MG TABS tablet Take 1 tablet (20 mg total) by mouth daily with supper. 01/14/17  Yes Turner, Eber Hong, MD  senna-docusate (SENNA S) 8.6-50 MG tablet Take 2 tablets by mouth 2 (two) times daily. Reduce to 2 tab po HS once constipation resolved 04/02/17  Yes Brunetta Genera, MD  dexamethasone (DECADRON) 4 MG tablet Take 2 tablets (8 mg total) daily by mouth. Start the day after bendamustine chemotherapy for 2 days. Take with food. Patient not taking: Reported on 04/30/2017 03/24/17   Brunetta Genera, MD  diazepam (VALIUM) 5 MG tablet 1 by mouth prior to MRI Patient not taking: Reported on 04/25/2017 04/10/17   Biagio Borg, MD  hydrocortisone cream 1 % Apply 1 application topically 4 (four) times daily as needed for itching. Patient not taking: Reported on  04/25/2017 03/02/17   Kayleen Memos, DO  mirtazapine (REMERON) 30 MG tablet Take 1 tablet (30 mg total) at bedtime by mouth. Patient not taking: Reported on 04/25/2017 03/26/17   Biagio Borg, MD  ondansetron (ZOFRAN) 8 MG tablet Take 1 tablet (8 mg total) 2 (two) times daily as needed by mouth for refractory nausea / vomiting. Start on day 2 after bendamustine chemo. Patient not taking: Reported on 04/25/2017 03/24/17   Brunetta Genera, MD  polyethylene glycol [facility] Floria Raveling) packet Take 17 g by mouth daily as needed. Patient not taking: Reported on 04/25/2017 12/30/16   Nche, Charlene Brooke, NP  prochlorperazine (COMPAZINE) 10 MG tablet Take 1 tablet (10 mg total) every 6 (six) hours as needed by mouth (Nausea or vomiting). Patient not taking: Reported on 04/25/2017 03/24/17   Brunetta Genera, MD    Family History Family History  Problem Relation Age of Onset  . Colon cancer Mother   . Ovarian cancer Mother   . Alcoholism Father   . CAD Father   . Alcohol abuse Father   . Ovarian cancer Sister   . Diabetes Neg Hx   . Stomach cancer Neg Hx     Social History Social History   Tobacco Use  . Smoking status: Former Smoker    Packs/day: 1.00    Years: 62.00    Pack years: 62.00    Types: Cigarettes    Last attempt to quit: 05/14/2016    Years since quitting: 0.9  . Smokeless tobacco: Never Used  Substance Use Topics  . Alcohol use: No    Alcohol/week: 0.0 oz  . Drug use: No    Comment: CBD OIL     Allergies   Demerol [meperidine] and Oxycodone hcl   Review of Systems Review of Systems   Physical Exam Updated Vital Signs BP 96/68   Pulse 61   Temp 97.8 F (36.6 C) (Oral)   Resp 10   SpO2 100%   Physical Exam  Constitutional: He is oriented to person, place, and time. He appears well-developed and well-nourished.  Elderly and frail-appearing  HENT:  Head: Normocephalic and atraumatic.  Mouth/Throat: Oropharynx is clear and moist and mucous  membranes are normal.  Eyes: Conjunctivae, EOM and lids are normal. Pupils are equal, round, and reactive to light.  Neck: Full passive range of motion without pain.  Cardiovascular: Normal  rate, regular rhythm, normal heart sounds and normal pulses. Exam reveals no gallop and no friction rub.  No murmur heard. Pulmonary/Chest: Effort normal. He has rales.  Patient able to speak in full sentences without difficulty.  He is in no respiratory distress on 2 L nasal cannula.  He has diffuse rales throughout the lower lung fields.  Abdominal: Soft. Normal appearance. There is no tenderness. There is no rigidity and no guarding.  Genitourinary: Penis normal.  Genitourinary Comments: The exam was performed with a chaperone present.  Condom cath in place.  Diffuse erythema noted to bilateral testicles that extends to the perineum.  Slight overlying warmth.  The erythema does not extend down into the perianal region.  Rectal exam shows no tenderness, fluctuance noted.  Patient does have some stool in the rectal vault.  Patient has several small, nonthrombosed hemorrhoids noted.  Small internal hemorrhoid noted.  Musculoskeletal: Normal range of motion.  2+ pitting edema noted from the knees extending distally to bilateral lower extremities.  There is some noted edema to the left upper extremity.  Neurological: He is alert and oriented to person, place, and time.  Skin: Skin is warm and dry. Capillary refill takes less than 2 seconds.  Psychiatric: He has a normal mood and affect. His speech is normal.  Nursing note and vitals reviewed.    ED Treatments / Results  Labs (all labs ordered are listed, but only abnormal results are displayed) Labs Reviewed  BASIC METABOLIC PANEL - Abnormal; Notable for the following components:      Result Value   Chloride 100 (*)    BUN 34 (*)    Calcium 7.8 (*)    All other components within normal limits  CBC - Abnormal; Notable for the following components:   WBC  3.1 (*)    RBC 3.48 (*)    Hemoglobin 10.8 (*)    HCT 32.7 (*)    RDW 16.4 (*)    All other components within normal limits  BRAIN NATRIURETIC PEPTIDE - Abnormal; Notable for the following components:   B Natriuretic Peptide 680.0 (*)    All other components within normal limits  POC OCCULT BLOOD, ED - Abnormal; Notable for the following components:   Fecal Occult Bld POSITIVE (*)    All other components within normal limits  URINALYSIS, ROUTINE W REFLEX MICROSCOPIC  OCCULT BLOOD X 1 CARD TO LAB, STOOL  CBG MONITORING, ED    EKG  EKG Interpretation  Date/Time:  Wednesday April 30 2017 13:10:03 EST Ventricular Rate:  67 PR Interval:    QRS Duration: 120 QT Interval:  440 QTC Calculation: 464 R Axis:   92 Text Interpretation:  Atrial fibrillation with premature ventricular or aberrantly conducted complexes Right bundle branch block T wave abnormality No significant change since last tracing Abnormal ekg Confirmed by Carmin Muskrat [id number]) on 04/30/2017 1:14:22 PM       Radiology Dg Chest 2 View  Result Date: 04/30/2017 CLINICAL DATA:  Back pain, body aches, near syncopal and syncopal episodes, hypotension. Patient has lymphoma and underwent second chemotherapy treatment last week but did not completed. Peripheral edema. History of CHF. EXAM: CHEST  2 VIEW COMPARISON:  Chest x-ray dated February 25, 2017 FINDINGS: The lungs are adequately inflated. There is a moderate-sized right pleural effusion which has increased slightly since the previous study. There is a tiny pleural effusion layering posteriorly. The cardiac silhouette is enlarged. The pulmonary vascularity is engorged. The interstitial markings are increased. The mediastinum is  normal in width. IMPRESSION: Findings compatible with congestive heart failure with interstitial edema and a moderate size right pleural effusion. Thoracic aortic atherosclerosis. Electronically Signed   By: David  Martinique M.D.   On: 04/30/2017  14:44    Procedures Procedures (including critical care time)  Medications Ordered in ED Medications  morphine 4 MG/ML injection 2 mg (2 mg Intravenous Given 04/30/17 1615)  furosemide (LASIX) injection 60 mg (60 mg Intravenous Given 04/30/17 1615)     Initial Impression / Assessment and Plan / ED Course  I have reviewed the triage vital signs and the nursing notes.  Pertinent labs & imaging results that were available during my care of the patient were reviewed by me and considered in my medical decision making (see chart for details).     71 y.o. F past medical history of lymphoma, CHF who his last chemo appointment was 1 week ago resents for evaluation of syncope, worsening fatigue, decreased appetite.  Wife reports that patient has several intermittent episodes of syncope for the last year.  She reports that over the last 2 days, has become more frequent.  He had a syncopal episode yesterday that lasted a few seconds.  Wife became concerned because a syncopal episode today lasted for more than a minute.  Additionally, patient has been complaining of some worsening fatigue, decreased appetite.  Patient has a history of CHF but always has chronic bilateral lower extremity edema.  No changes noted.  Patient is on 2 L of O2 at home intermittently.  He has required 2 L O2 here continuously. Patient is afebrile, non-toxic appearing. Vital signs reviewed and stable.  Physical exam shows diffuse rales throughout the mid lung fields that extend down.  Bilateral lower extremity edema noted.  Patient does have some erythema of the scrotum that extends to the perineum region.  It does not extend to the perianal.  Plan to check basic labs including BNP, BMP, CBC, UA, chest x-ray.  BNP is elevated at 680.  Patient has a history of elevated BNP's but today's appears slightly worse.  CBC shows anemia of 10.8 and 32.7.  This is a drop from his most recent visit 5 days ago.  Patient has not had any episodes  of bright red blood per rectum or melena.  We will do a fecal occult for further evaluation.  BMP shows slightly elevated BUN at 34 otherwise unremarkable.  UA pending.  Chest x-ray concerning for just of heart failure.  Patient does have some erythema noted to the scrotal region.  He was discharged on doxycycline on 04/25/17 which she has been taking.  Rectal exam shows 2, small nonthrombosed external hemorrhoids.  There is evidence of one small internal hemorrhoid.  No anal fissures noted.  Fecal occult positive.  Suspect that it might be due to hemorrhoids.  Low suspicion for GI bleed.  Given concerns of syncope, fatigue, CHF exacerbation and lowering hemoglobin, will plan for admission.  Discussed patient with Dr. Tomie China (hospitalist). Will admit.   Final Clinical Impressions(s) / ED Diagnoses   Final diagnoses:  Syncope and collapse  Acute on chronic congestive heart failure, unspecified heart failure type [facility])    ED Discharge Orders    None       Desma Mcgregor 04/30/17 1626    Carmin Muskrat, MD 05/02/17 2205

## 2017-04-30 NOTE — ED Notes (Signed)
ED TO INPATIENT HANDOFF REPORT  Name/Age/Gender [name] 71 y.o. male  Code Status    Code Status Orders  (From admission, onward)        Start     Ordered   04/30/17 1712  Full code  Continuous     04/30/17 1713    Code Status History    Date Active Date Inactive Code Status Order ID Comments User Context   02/21/2017 23:00 03/02/2017 22:45 Full Code [id number]  Toy Baker, MD Inpatient   11/18/2016 04:07 11/24/2016 20:24 Full Code [id number]  Jani Gravel, MD ED   05/20/2016 15:50 05/31/2016 19:14 Full Code [id number]  Nani Skillern, PA-C Inpatient   05/14/2016 02:56 05/20/2016 15:50 Full Code [id number]  Toy Baker, MD Inpatient      Home/SNF/Other Home  Chief Complaint syncope   Level of Care/Admitting Diagnosis ED Disposition    ED Disposition Condition [place]: [facility] [[id number]]  Level of Care: Telemetry [5]  Admit to tele based on following criteria: Other see comments  Comments: chf  Diagnosis: Acute on chronic diastolic (congestive) heart failure [facility]) [[id number]]  Admitting Physician: Ivor Costa [[id number]]  Attending Physician: Ivor Costa [phone]  Estimated length of stay: past midnight tomorrow  Certification:: I certify this patient will need inpatient services for at least 2 midnights  PT Class (Do Not Modify): Inpatient [101]  PT Acc Code (Do Not Modify): Private [1]       Medical History Past Medical History:  Diagnosis Date  . Anxiety   . Arthritis    "neck" (08/15/2015)  . Chronic bronchitis ([place])   . Chronic diastolic CHF (congestive heart failure) ([place])   . Constipation   . COPD (chronic obstructive pulmonary disease) ([place])   . DDD (degenerative disc disease), cervical   . Depression   . Dilated aortic root ([place])    57m on echo 05/2016  . Edema of left lower extremity   . Facial basal cell cancer   . H/O acne vulgaris 1960s   [facility]  . Headache    history of - left temporal- years ago- not current (08/15/2015)  . Persistent atrial fibrillation (HCC)    on coumadin with CHADS2VASC score of 2  . Pneumonia 1960s; 2015 X 2  . Prostate cancer ([facility] early 2000s   "low spreading; non aggressive type" (08/15/2015)  . [place]    PASP 443mg by echo 05/2016  . Skin cancer    "back"    Allergies Allergies  Allergen Reactions  . Demerol [Meperidine] Other (See Comments)    UNSPECIFIED REACTION  Causes system to shutdown. ?   . Oxycodone Hcl Other (See Comments)    Pt states this medication 'wires him up' and makes pt hyper; pt does not want to take this ever again    IV Location/Drains/Wounds Patient Lines/Drains/Airways Status   Active Line/Drains/Airways    Name:   Placement date:   Placement time:   Site:   Days:   Peripheral IV 04/30/17 Right Antecubital   04/30/17    1314    Antecubital   less than 1   Peripheral IV   -    -    -      Incision (Closed) 02/27/17 Back   02/27/17    1453     62   Wound / Incision (Open or Dehisced) 02/27/17 Burn Back  02/27/17    1207    Back   62          Labs/Imaging Results for orders placed or performed during the hospital encounter of 04/30/17 (from the past 48 hour(s))  CBG monitoring, ED     Status: None   Collection Time: 04/30/17  1:18 PM  Result Value Ref Range   Glucose-Capillary 79 65 - 99 mg/dL  Basic metabolic panel     Status: Abnormal   Collection Time: 04/30/17  1:27 PM  Result Value Ref Range   Sodium 135 135 - 145 mmol/L   Potassium 4.1 3.5 - 5.1 mmol/L   Chloride 100 (L) 101 - 111 mmol/L   CO2 30 22 - 32 mmol/L   Glucose, Bld 96 65 - 99 mg/dL   BUN 34 (H) 6 - 20 mg/dL   Creatinine, Ser 1.06 0.61 - 1.24 mg/dL   Calcium 7.8 (L) 8.9 - 10.3 mg/dL   GFR calc non Af Amer >60 >60 mL/min   GFR calc Af Amer >60 >60 mL/min    Comment: (NOTE) The eGFR has been calculated using the CKD EPI equation. This calculation has not been  validated in all clinical situations. eGFR's persistently <60 mL/min signify possible Chronic Kidney Disease.    Anion gap 5 5 - 15  CBC     Status: Abnormal   Collection Time: 04/30/17  1:27 PM  Result Value Ref Range   WBC 3.1 (L) 4.0 - 10.5 K/uL   RBC 3.48 (L) 4.22 - 5.81 MIL/uL   Hemoglobin 10.8 (L) 13.0 - 17.0 g/dL   HCT 32.7 (L) 39.0 - 52.0 %   MCV 94.0 78.0 - 100.0 fL   MCH 31.0 26.0 - 34.0 pg   MCHC 33.0 30.0 - 36.0 g/dL   RDW 16.4 (H) 11.5 - 15.5 %   Platelets 309 150 - 400 K/uL  Brain natriuretic peptide     Status: Abnormal   Collection Time: 04/30/17  1:27 PM  Result Value Ref Range   B Natriuretic Peptide 680.0 (H) 0.0 - 100.0 pg/mL  POC occult blood, ED     Status: Abnormal   Collection Time: 04/30/17  4:01 PM  Result Value Ref Range   Fecal Occult Bld POSITIVE (A) NEGATIVE  Urinalysis, Routine w reflex microscopic     Status: Abnormal   Collection Time: 04/30/17  5:20 PM  Result Value Ref Range   Color, Urine YELLOW YELLOW   APPearance CLEAR CLEAR   Specific Gravity, Urine 1.012 1.005 - 1.030   pH 7.0 5.0 - 8.0   Glucose, UA NEGATIVE NEGATIVE mg/dL   Hgb urine dipstick NEGATIVE NEGATIVE   Bilirubin Urine NEGATIVE NEGATIVE   Ketones, ur NEGATIVE NEGATIVE mg/dL   Protein, ur 100 (A) NEGATIVE mg/dL   Nitrite NEGATIVE NEGATIVE   Leukocytes, UA NEGATIVE NEGATIVE   RBC / HPF 0-5 0 - 5 RBC/hpf   WBC, UA 0-5 0 - 5 WBC/hpf   Bacteria, UA RARE (A) NONE SEEN   Squamous Epithelial / LPF 0-5 (A) NONE SEEN  Troponin I (q 6hr x 3)     Status: Abnormal   Collection Time: 04/30/17  6:06 PM  Result Value Ref Range   Troponin I 0.08 (HH) <0.03 ng/mL    Comment: CRITICAL RESULT CALLED TO, READ BACK BY AND VERIFIED WITH: TIM SMITH,RN [id number] @ 1901 BY J SCOTTON    Dg Chest 2 View  Result Date: 04/30/2017 CLINICAL DATA:  Back pain, body aches, near syncopal  and syncopal episodes, hypotension. Patient has lymphoma and underwent second chemotherapy treatment last week but  did not completed. Peripheral edema. History of CHF. EXAM: CHEST  2 VIEW COMPARISON:  Chest x-ray dated February 25, 2017 FINDINGS: The lungs are adequately inflated. There is a moderate-sized right pleural effusion which has increased slightly since the previous study. There is a tiny pleural effusion layering posteriorly. The cardiac silhouette is enlarged. The pulmonary vascularity is engorged. The interstitial markings are increased. The mediastinum is normal in width. IMPRESSION: Findings compatible with congestive heart failure with interstitial edema and a moderate size right pleural effusion. Thoracic aortic atherosclerosis. Electronically Signed   By: David  Martinique M.D.   On: 04/30/2017 14:44   Mr Jeri Cos OQ Contrast  Result Date: 04/30/2017 CLINICAL DATA:  Syncopal episode. On chemotherapy for lymphoma. Assess for stroke or metastasis. History of headache, prostate cancer, tobacco abuse. EXAM: MRI HEAD WITHOUT AND WITH CONTRAST TECHNIQUE: Multiplanar, multiecho pulse sequences of the brain and surrounding structures were obtained without and with intravenous contrast. CONTRAST:  25m MULTIHANCE GADOBENATE DIMEGLUMINE 529 MG/ML IV SOLN COMPARISON:  None. FINDINGS: Multiple sequences are moderately motion degraded. INTRACRANIAL CONTENTS: No reduced diffusion to suggest acute ischemia or hypercellular tumor. No susceptibility artifact to suggest hemorrhage. The ventricles and sulci are normal for patient's age. A few scattered subcentimeter supratentorial white matter FLAIR T2 hyperintensities compatible with mild chronic small vessel ischemic disease, less than expected for age. 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 No suspicious parenchymal signal, masses, mass effect. No abnormal intraparenchymal or extra-axial enhancement. No abnormal extra-axial fluid collections. No extra-axial masses. VASCULAR: Normal major intracranial vascular flow voids present at skull base. SKULL AND UPPER CERVICAL SPINE:  Abnormally decreased signal RIGHT lateral mass and C1-2 articulation with enhancing effusion and arthropathy. No abnormal sellar expansion. No suspicious calvarial bone marrow signal. Craniocervical junction maintained. Small front and enhancing scalp nodule, possible subacute hematoma SINUSES/ORBITS: Mild paranasal sinus mucosal thickening with bilateral maxillary mucosal retention cyst. The included ocular globes and orbital contents are non-suspicious. OTHER: None. IMPRESSION: 1. No acute intracranial process or intracranial metastasis on this motion degraded examination. 2. Old small cerebellar infarcts, otherwise negative MRI of the head for age. 3. RIGHT C1-2 abnormal signal, arthropathy and enhancing effusion, likely reactive. Recommend dedicated CT cervical spine to evaluate osseous changes. Electronically Signed   By: CElon AlasM.D.   On: 04/30/2017 20:57    Pending Labs Unresulted Labs (From admission, onward)   Start     Ordered   05/01/17 09476 Basic metabolic panel  Daily,   R     04/30/17 1713   04/30/17 1714  Troponin I (q 6hr x 3)  Now then every 6 hours,   R     04/30/17 1713   04/30/17 1601  Occult blood card to lab, stool  Once,   STAT     04/30/17 1600      Vitals/Pain Today's Vitals   04/30/17 1930 04/30/17 2044 04/30/17 2121 04/30/17 2200  BP: (!) 99/59  99/72 95/62  Pulse: 74  70 89  Resp: _0 Temp:      TempSrc:      SpO2: 100%  100% 99%  PainSc:  5       Isolation Precautions No active isolations  Medications Medications  morphine 4 MG/ML injection 1 mg (1 mg Intravenous Given 04/30/17 2046)  acyclovir (ZOVIRAX) tablet 400 mg (400 mg Oral Given 04/30/17 1814)  docusate sodium (COLACE) capsule 100-300  mg (100 mg Oral Given 04/30/17 1828)  doxycycline (VIBRA-TABS) tablet 100 mg (not administered)  feeding supplement (BOOST / RESOURCE BREEZE) liquid 1 Container (not administered)  HYDROcodone-acetaminophen (NORCO) 10-325 MG per tablet 1 tablet  (1 tablet Oral Given 04/30/17 1814)  midodrine (PROAMATINE) tablet 5 mg (not administered)  rivaroxaban (XARELTO) tablet 20 mg (20 mg Oral Given 04/30/17 1815)  senna-docusate (Senokot-S) tablet 2 tablet (not administered)  furosemide (LASIX) 8 MG/ML solution 40 mg (not administered)  sodium chloride flush (NS) 0.9 % injection 3 mL (not administered)  sodium chloride flush (NS) 0.9 % injection 3 mL (not administered)  0.9 %  sodium chloride infusion (not administered)  acetaminophen (TYLENOL) tablet 650 mg (not administered)  ondansetron (ZOFRAN) injection 4 mg (not administered)  zolpidem (AMBIEN) tablet 5 mg (not administered)  albuterol (PROVENTIL) (2.5 MG/3ML) 0.083% nebulizer solution 2.5 mg (not administered)  morphine 4 MG/ML injection 2 mg (2 mg Intravenous Given 04/30/17 1615)  furosemide (LASIX) injection 60 mg (60 mg Intravenous Given 04/30/17 1615)  gadobenate dimeglumine (MULTIHANCE) injection 20 mL (14 mLs Intravenous Contrast Given 04/30/17 2028)    Mobility Walks (unsteady at this time)

## 2017-04-30 NOTE — Progress Notes (Addendum)
Rapid Response Event Note  Overview:  RRT called at 2320 for pt having brief loss of consciousness. Pt was admitted today regarding having syncopal episodes at home.    Initial Focused Assessment: Pt alert and oriented x4 upon my arrival. Pt was getting up to urinate, with assistance by staff, when he became unconscious. Pt was aided by staff back into the bed during syncopal episode. Per primary RN, pt immediately regained full consciousness after syncopal episode. RN checked for a pulse after helping pt back to bed during syncopal event, a pulse was felt. Pt currently on telemetry, and a change in pt rhythm was not noted by staff or CCMD during or prior to syncopal episode. Per RN, pt has been in A-fib, rate controlled and pt has a history of "persistent atrial fibrillation".   Interventions: 12 lead EKG Primary RN paged Lamar Blinks, NP to update her regarding syncopal episode.   Plan of Care (if not transferred): Per Dr. Edgar Frisk note, MD was aware of pt having syncopal episodes when he admitted pt to telemetry unit. RN also paged Lamar Blinks, NP to make her aware of syncopal event. Primary RN to continue to monitor pt HR for changes that could be related to syncopal episodes. I recommended that pt not get up out of bed or bare down for tonight, until further evaluation by attending MD. Pt may need orthostatic vital sign check, but I recommended RN wait and consult with attending MD before doing so.   Event Summary: VS at 2326: BP 92/58, HR 89, RR 18, O2 100% on RA VS at 2330: BP 93/64, HR 92, RR 21, O2 100% on RA  Kenna Seward L Reyonna Haack

## 2017-04-30 NOTE — ED Notes (Signed)
Date and time results received: 04/30/17  1720   Test: Troponin Critical Value: 0.08

## 2017-05-01 ENCOUNTER — Other Ambulatory Visit: Payer: Self-pay

## 2017-05-01 ENCOUNTER — Inpatient Hospital Stay (HOSPITAL_COMMUNITY): Payer: Medicare Other

## 2017-05-01 ENCOUNTER — Encounter (HOSPITAL_COMMUNITY): Payer: Self-pay

## 2017-05-01 DIAGNOSIS — R55 Syncope and collapse: Secondary | ICD-10-CM

## 2017-05-01 DIAGNOSIS — L8990 Pressure ulcer of unspecified site, unspecified stage: Secondary | ICD-10-CM

## 2017-05-01 DIAGNOSIS — I361 Nonrheumatic tricuspid (valve) insufficiency: Secondary | ICD-10-CM

## 2017-05-01 LAB — ECHOCARDIOGRAM COMPLETE
CHL CUP DOP CALC LVOT VTI: 11.4 cm
CHL CUP RV SYS PRESS: 27 mmHg
E decel time: 239 msec
EERAT: 7.75
FS: 22 % — AB (ref 28–44)
HEIGHTINCHES: 71 in
IV/PV OW: 1.4
LA ID, A-P, ES: 44 mm
LA diam end sys: 44 mm
LA diam index: 2.28 cm/m2
LA vol A4C: 96.4 ml
LV E/e'average: 7.75
LVEEMED: 7.75
LVELAT: 5.59 cm/s
LVOT SV: 39 mL
LVOT area: 3.46 cm2
LVOT diameter: 21 mm
LVOTPV: 61.4 cm/s
MV Dec: 239
MVPKEVEL: 43.3 m/s
PW: 10.6 mm — AB (ref 0.6–1.1)
Reg peak vel: 244 cm/s
TDI e' lateral: 5.59
TDI e' medial: 4.53
TR max vel: 244 cm/s
WEIGHTICAEL: 2610.25 [oz_av]

## 2017-05-01 LAB — TROPONIN I
Troponin I: 0.08 ng/mL (ref <0.03)
Troponin I: 0.08 ng/mL (ref <0.03)

## 2017-05-01 LAB — URINALYSIS, ROUTINE W REFLEX MICROSCOPIC
Bilirubin Urine: NEGATIVE
GLUCOSE, UA: NEGATIVE mg/dL
Ketones, ur: NEGATIVE mg/dL
LEUKOCYTES UA: NEGATIVE
Nitrite: NEGATIVE
Protein, ur: 100 mg/dL — AB
SPECIFIC GRAVITY, URINE: 1.02 (ref 1.005–1.030)
pH: 6 (ref 5.0–8.0)

## 2017-05-01 LAB — URINALYSIS, MICROSCOPIC (REFLEX): BACTERIA UA: NONE SEEN

## 2017-05-01 LAB — CHROMOSOME ANALYSIS, BONE MARROW

## 2017-05-01 LAB — BASIC METABOLIC PANEL
ANION GAP: 7 (ref 5–15)
BUN: 34 mg/dL — ABNORMAL HIGH (ref 6–20)
CO2: 29 mmol/L (ref 22–32)
Calcium: 8.2 mg/dL — ABNORMAL LOW (ref 8.9–10.3)
Chloride: 97 mmol/L — ABNORMAL LOW (ref 101–111)
Creatinine, Ser: 1.27 mg/dL — ABNORMAL HIGH (ref 0.61–1.24)
GFR, EST NON AFRICAN AMERICAN: 54 mL/min — AB (ref >60)
Glucose, Bld: 96 mg/dL (ref 65–99)
POTASSIUM: 4.3 mmol/L (ref 3.5–5.1)
SODIUM: 133 mmol/L — AB (ref 135–145)

## 2017-05-01 LAB — CORTISOL-AM, BLOOD: CORTISOL - AM: 16.9 ug/dL (ref 6.7–22.6)

## 2017-05-01 MED ORDER — MUSCLE RUB 10-15 % EX CREA
TOPICAL_CREAM | CUTANEOUS | Status: DC | PRN
  Administered 2017-05-01: 12:00:00 via TOPICAL
  Filled 2017-05-01: qty 85

## 2017-05-01 MED ORDER — MIDODRINE HCL 5 MG PO TABS
10.0000 mg | ORAL_TABLET | Freq: Three times a day (TID) | ORAL | Status: DC
  Administered 2017-05-01 – 2017-05-12 (×34): 10 mg via ORAL
  Filled 2017-05-01 (×35): qty 2

## 2017-05-01 MED ORDER — MAGNESIUM HYDROXIDE 400 MG/5ML PO SUSP: 30.0000 mL | Freq: Every day | ORAL | Status: DC | PRN

## 2017-05-01 MED ORDER — POLYETHYLENE GLYCOL 3350 17 G PO PACK
17.0000 g | PACK | Freq: Two times a day (BID) | ORAL | Status: DC
  Administered 2017-05-01 – 2017-05-03 (×4): 17 g via ORAL
  Filled 2017-05-01 (×5): qty 1

## 2017-05-01 MED ORDER — OXYCODONE-ACETAMINOPHEN 5-325 MG PO TABS
1.0000 | ORAL_TABLET | Freq: Four times a day (QID) | ORAL | Status: DC | PRN
  Administered 2017-05-01 – 2017-05-03 (×8): 1 via ORAL
  Administered 2017-05-04: 2 via ORAL
  Administered 2017-05-04 (×3): 1 via ORAL
  Administered 2017-05-05 – 2017-05-09 (×13): 2 via ORAL
  Administered 2017-05-10: 1 via ORAL
  Administered 2017-05-10 – 2017-05-12 (×6): 2 via ORAL
  Filled 2017-05-01: qty 2
  Filled 2017-05-01: qty 1
  Filled 2017-05-01: qty 2
  Filled 2017-05-01: qty 1
  Filled 2017-05-01 (×8): qty 2
  Filled 2017-05-01 (×2): qty 1
  Filled 2017-05-01: qty 2
  Filled 2017-05-01: qty 1
  Filled 2017-05-01: qty 2
  Filled 2017-05-01: qty 1
  Filled 2017-05-01: qty 2
  Filled 2017-05-01: qty 1
  Filled 2017-05-01 (×3): qty 2
  Filled 2017-05-01: qty 1
  Filled 2017-05-01: qty 2
  Filled 2017-05-01: qty 1
  Filled 2017-05-01: qty 2
  Filled 2017-05-01: qty 1
  Filled 2017-05-01: qty 2
  Filled 2017-05-01: qty 1
  Filled 2017-05-01 (×2): qty 2

## 2017-05-01 MED ORDER — MIRTAZAPINE 15 MG PO TBDP
7.5000 mg | ORAL_TABLET | Freq: Every day | ORAL | Status: DC
  Administered 2017-05-01 – 2017-05-11 (×11): 7.5 mg via ORAL
  Filled 2017-05-01 (×11): qty 0.5

## 2017-05-01 NOTE — Progress Notes (Signed)
Pt is active with [facility] for HHPT/NA/RN.

## 2017-05-01 NOTE — Progress Notes (Signed)
  Echocardiogram 2D Echocardiogram has been performed.  Darlina Sicilian M 05/01/2017, 2:16 PM

## 2017-05-01 NOTE — Progress Notes (Signed)
[facility]  PROGRESS NOTE  [name] YOV:[id number] DOB: 1946/03/30 DOA: 04/30/2017 PCP: Biagio Borg, MD   Brief HPI:    71 y.o. male with medical history significant of COPD on 2 L nasal cannula oxygen, dCHF, prostate cancer, depression, anxiety, skin cancer, lymphoma on chemotherapy, cellulitis in groin area, a fib on Xarelto, who presents with syncope.     Subjective   Patient complains of back pain which has been chronic. Also patient has positive orthostatic hypotension, and had another syncopal episode.   Assessment/Plan:     1. Syncope- likely from orthostatic hypotension, systolic blood pressure dropped to 35/22, patient collapsed. Will check serum cortisol level in am. TED hose, increase the dose of midodrine to 10 mg by mouth 3 times a day. Echocardiogram showed normal EF 55-60%, no wall motion abnormality. MRI brain shows no acute abnormality. 2. Acute on chronic diastolic CHF- patient has significant edema of all extremities. Patient started on IV Lasix. Will hold Lasix due to orthostatic hypotension. 3. Anasarca- from severe protein calorie malnutrition, nutrition consulted. Albumin is 1.9. 4. Atrial fibrillation- CHA2DS2-VASc Score is 2, patient is on Xarelto at home. Continue Xarelto. 5. 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 6. Cellulitis  in groin area- continue doxycycline. 7. COPD- continue prn albuterol. 8. Chronic back pain- will start Percocet 1-2 tablets every 6 hours when necessary, local muscle rub cream. 9. Constipation- we'll start MiraLAX 17 g by mouth twice a day.  10. FOBT positive- hemoglobin is stable at 10.8, patient did have anal fissure from straining due to constipation as per patient's wife. Follow CBC in a.m.     DVT prophylaxis: Xarelto  Code Status: Full code  Family Communication: Discussed with patient's wife  at bedside.  Disposition Plan: Pending workup for  syncope.   Consultants:  None  Procedures:  None  Continuous infusions . sodium chloride        Antibiotics:   Anti-infectives (From admission, onward)   Start     Dose/Rate Route Frequency Ordered Stop   04/30/17 2200  doxycycline (VIBRA-TABS) tablet 100 mg     100 mg Oral 2 times daily 04/30/17 1708 05/02/17 2359   04/30/17 1800  acyclovir (ZOVIRAX) tablet 400 mg     400 mg Oral Daily 04/30/17 1708         Objective   Vitals:   05/01/17 1305 05/01/17 1317 05/01/17 1733 05/01/17 1736  BP: (!) 83/45 (!) 78/46 (!) 85/51 (!) 91/55  Pulse: 69 65 65   Resp: 12     Temp: (!) 97.4 F (36.3 C)  97.8 F (36.6 C)   TempSrc: Oral  Oral   SpO2: 100%     Weight:      Height:        Intake/Output Summary (Last 24 hours) at 05/01/2017 1903 Last data filed at 05/01/2017 1800 Gross per 24 hour  Intake 440 ml  Output 1250 ml  Net -810 ml   Filed Weights   04/30/17 2256 05/01/17 0500  Weight: 74.4 kg (164 lb 0.4 oz) 74 kg (163 lb 2.3 oz)     Physical Examination:   Physical Exam: Eyes: No icterus, extraocular muscles intact  Mouth: Oral mucosa is moist, no lesions on palate,  Neck: Supple, no deformities, masses, or tenderness Lungs: Normal respiratory effort, bilateral clear to auscultation, no crackles or wheezes.  Heart: Regular rate and rhythm, S1 and S2 normal, no murmurs, rubs auscultated Abdomen: BS normoactive,soft,nondistended,non-tender to  palpation,no organomegaly Extremities: 2+ pitting edema in all 4 extremities Neuro : Alert and oriented to time, place and person, No focal deficits  Skin: No rashes seen on exam     Data Reviewed: I have personally reviewed following labs and imaging studies  CBG: Recent Labs  Lab 04/30/17 1318  GLUCAP 79    CBC: Recent Labs  Lab 04/25/17 1308 04/30/17 1327  WBC 8.6 3.1*  NEUTROABS 7.6  --   HGB 11.4* 10.8*  HCT 34.8* 32.7*  MCV 94.6 94.0  PLT 281 242    Basic Metabolic Panel: Recent Labs   Lab 04/25/17 1308 04/30/17 1327 05/01/17 0515  NA 135 135 133*  K 4.2 4.1 4.3  CL 98* 100* 97*  CO2 _0 GLUCOSE 101* 96 96  BUN 32* 34* 34*  CREATININE 1.21 1.06 1.27*  CALCIUM 8.4* 7.8* 8.2*    Recent Results (from the past 240 hour(s))  Blood culture (routine x 2)     Status: None   Collection Time: 04/25/17  1:00 PM  Result Value Ref Range Status   Specimen Description BLOOD RIGHT ANTECUBITAL  Final   Special Requests   Final    BOTTLES DRAWN AEROBIC AND ANAEROBIC Blood Culture adequate volume   Culture   Final    NO GROWTH 5 DAYS Performed at [facility] [address], [place], [place] [address]    Report Status 04/30/2017 FINAL  Final  Blood culture (routine x 2)     Status: None   Collection Time: 04/25/17  1:25 PM  Result Value Ref Range Status   Specimen Description BLOOD RIGHT FOREARM  Final   Special Requests   Final    BOTTLES DRAWN AEROBIC AND ANAEROBIC Blood Culture adequate volume   Culture   Final    NO GROWTH 5 DAYS Performed at [facility], [place] [address], [place], [place] [address]    Report Status 04/30/2017 FINAL  Final     Liver Function Tests: Recent Labs  Lab 04/25/17 1308  AST 26  ALT 15*  ALKPHOS 302*  BILITOT 0.3  PROT 5.3*  ALBUMIN 1.9*   No results for input(s): LIPASE, AMYLASE in the last 168 hours. No results for input(s): AMMONIA in the last 168 hours.  Cardiac Enzymes: Recent Labs  Lab 04/30/17 1806 04/30/17 2340 05/01/17 0515  TROPONINI 0.08* 0.08* 0.08*   BNP (last 3 results) Recent Labs    11/22/16 0318 02/21/17 1637 04/30/17 1327  BNP 678.9* 626.5* 680.0*    ProBNP (last 3 results) No results for input(s): PROBNP in the last 8760 hours.    Studies: Dg Chest 2 View  Result Date: 04/30/2017 CLINICAL DATA:  Back pain, body aches, near syncopal and syncopal episodes, hypotension. Patient has lymphoma and underwent second chemotherapy treatment last week but did not completed.  Peripheral edema. History of CHF. EXAM: CHEST  2 VIEW COMPARISON:  Chest x-ray dated February 25, 2017 FINDINGS: The lungs are adequately inflated. There is a moderate-sized right pleural effusion which has increased slightly since the previous study. There is a tiny pleural effusion layering posteriorly. The cardiac silhouette is enlarged. The pulmonary vascularity is engorged. The interstitial markings are increased. The mediastinum is normal in width. IMPRESSION: Findings compatible with congestive heart failure with interstitial edema and a moderate size right pleural effusion. Thoracic aortic atherosclerosis. Electronically Signed   By: David  Martinique M.D.   On: 04/30/2017 14:44   Mr Jeri Cos QM Contrast  Result Date: 04/30/2017 CLINICAL DATA:  Syncopal episode. On chemotherapy for lymphoma. Assess for stroke or metastasis. History of headache, prostate cancer, tobacco abuse. EXAM: MRI HEAD WITHOUT AND WITH CONTRAST TECHNIQUE: Multiplanar, multiecho pulse sequences of the brain and surrounding structures were obtained without and with intravenous contrast. CONTRAST:  54m MULTIHANCE GADOBENATE DIMEGLUMINE 529 MG/ML IV SOLN COMPARISON:  None. FINDINGS: Multiple sequences are moderately motion degraded. INTRACRANIAL CONTENTS: No reduced diffusion to suggest acute ischemia or hypercellular tumor. No susceptibility artifact to suggest hemorrhage. The ventricles and sulci are normal for patient's age. A few scattered subcentimeter supratentorial white matter FLAIR T2 hyperintensities compatible with mild chronic small vessel ischemic disease, less than expected for age. 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 No suspicious parenchymal signal, masses, mass effect. No abnormal intraparenchymal or extra-axial enhancement. No abnormal extra-axial fluid collections. No extra-axial masses. VASCULAR: Normal major intracranial vascular flow voids present at skull base. SKULL AND UPPER CERVICAL SPINE: Abnormally decreased  signal RIGHT lateral mass and C1-2 articulation with enhancing effusion and arthropathy. No abnormal sellar expansion. No suspicious calvarial bone marrow signal. Craniocervical junction maintained. Small front and enhancing scalp nodule, possible subacute hematoma SINUSES/ORBITS: Mild paranasal sinus mucosal thickening with bilateral maxillary mucosal retention cyst. The included ocular globes and orbital contents are non-suspicious. OTHER: None. IMPRESSION: 1. No acute intracranial process or intracranial metastasis on this motion degraded examination. 2. Old small cerebellar infarcts, otherwise negative MRI of the head for age. 3. RIGHT C1-2 abnormal signal, arthropathy and enhancing effusion, likely reactive. Recommend dedicated CT cervical spine to evaluate osseous changes. Electronically Signed   By: CElon AlasM.D.   On: 04/30/2017 20:57    Scheduled Meds: . acyclovir  400 mg Oral Daily  . docusate sodium  100-300 mg Oral Daily  . doxycycline  100 mg Oral BID  . feeding supplement  1 Container Oral TID BM  . furosemide  40 mg Oral BID  . midodrine  10 mg Oral TID WC  . mirtazapine  7.5 mg Oral QHS  . polyethylene glycol  17 g Oral BID  . rivaroxaban  20 mg Oral Q supper  . senna-docusate  2 tablet Oral BID  . sodium chloride flush  3 mL Intravenous Q12H      Time spent: 20 min  [facility] Pager [phone] If 7PM-7AM, please contact night-coverage at [email or web address], Office  [phone] password [email or web address]  05/01/2017, 7:03 PM  LOS: 1 day

## 2017-05-01 NOTE — Progress Notes (Signed)
Carotid artery duplex has been completed. 1-39% ICA stenosis bilaterally.  05/01/17 9:17 AM [name] RVT

## 2017-05-02 ENCOUNTER — Encounter (HOSPITAL_COMMUNITY): Payer: Self-pay

## 2017-05-02 DIAGNOSIS — E440 Moderate protein-calorie malnutrition: Secondary | ICD-10-CM

## 2017-05-02 LAB — BASIC METABOLIC PANEL
Anion gap: 7 (ref 5–15)
BUN: 35 mg/dL — ABNORMAL HIGH (ref 6–20)
CHLORIDE: 98 mmol/L — AB (ref 101–111)
CO2: 29 mmol/L (ref 22–32)
CREATININE: 1.15 mg/dL (ref 0.61–1.24)
Calcium: 8.2 mg/dL — ABNORMAL LOW (ref 8.9–10.3)
GFR calc non Af Amer: >60 mL/min (ref >60)
Glucose, Bld: 85 mg/dL (ref 65–99)
Potassium: 3.6 mmol/L (ref 3.5–5.1)
Sodium: 134 mmol/L — ABNORMAL LOW (ref 135–145)

## 2017-05-02 LAB — CBC
HEMATOCRIT: 32 % — AB (ref 39.0–52.0)
HEMOGLOBIN: 10.7 g/dL — AB (ref 13.0–17.0)
MCH: 31 pg (ref 26.0–34.0)
MCHC: 33.4 g/dL (ref 30.0–36.0)
MCV: 92.8 fL (ref 78.0–100.0)
Platelets: 307 10*3/uL (ref 150–400)
RBC: 3.45 MIL/uL — AB (ref 4.22–5.81)
RDW: 16.9 % — AB (ref 11.5–15.5)
WBC: 3.3 10*3/uL — AB (ref 4.0–10.5)

## 2017-05-02 LAB — CORTISOL: CORTISOL PLASMA: 8.4 ug/dL

## 2017-05-02 NOTE — Progress Notes (Signed)
Durable Medical Equipment  (From admission, onward)        Start     Ordered   05/02/17 1610  For home use only DME lightweight manual wheelchair with seat cushion  Once    Comments:  Patient suffers from Acute on Chronic CHF which impairs their ability to perform daily activities like Grooming, dressing in the home.  A cane or walker will not resolve  issue with performing activities of daily living. A wheelchair will allow patient to safely perform daily activities. Patient is not able to propel themselves in the home using a standard weight wheelchair due to poor balance and weakness. Patient can self propel in the lightweight wheelchair.  Accessories: elevating leg rests (ELRs), wheel locks, extensions and anti-tippers.   05/02/17 1614

## 2017-05-02 NOTE — Progress Notes (Signed)
Initial Nutrition Assessment  DOCUMENTATION CODES:   Non-severe (moderate) malnutrition in context of chronic illness  INTERVENTION:   Boost Breeze po TID, each supplement provides 250 kcal and 9 grams of protein Magic cup TID with meals, each supplement provides 290 kcal and 9 grams of protein  NUTRITION DIAGNOSIS:   Moderate Malnutrition related to chronic illness, cancer and cancer related treatments as evidenced by edema, severe muscle depletion, moderate muscle depletion, moderate fat depletion, severe fat depletion.  GOAL:   Patient will meet greater than or equal to 90% of their needs  MONITOR:   PO intake, Weight trends, Labs, Supplement acceptance  REASON FOR ASSESSMENT:   Consult Assessment of nutrition requirement/status  ASSESSMENT:   Pt with PMH significant for COPD, dCHF, prostate cancer, depression, lymphoma on chemotherapy, and cellulitis in groin. Presents this admission with acute on chronic heart failure and anasarca.    Spoke with pt at bedside. Admits to having decreased PO intake for "months" related to taste changes. States he does not consume full meals and will often pick throughout the day. Pt tries to drink supplementation at home, but is having a hard time tolerating the taste. Pt is drinking Boost Breeze this hospital stay and would like to continue with them. Spoke with nurse who reports pt's wife feeds him all meals. If pt does not like a texture of a food he will begin to vomit. Pt is followed by cancer center RD. Will encourage PO intake and continue with supplementation. (Attempt Magic Cup as well)  Pt reports a UBW of 180 lb, the last time being at that weight in January 2018. Weight trends show fluctuations in weight throughout the year likely related to CHF history. Suspect pt has lost significant amount of weight but hard to determine exact amount given fluid accumulation.   Nutrition-Focused physical exam completed. Bilateral lower  extremities show moderate swelling in the calf and feet area. Bilateral upper extremities show severe edema. Fluid accumulation may be masking muscle depletions.   Medications reviewed and include: colace, lasix Labs reviewed: Na 134 (L) Cl 98 (L) BUN 35 (H)   NUTRITION - FOCUSED PHYSICAL EXAM:    Most Recent Value  Orbital Region  Moderate depletion  Upper Arm Region  Severe depletion  Thoracic and Lumbar Region  Unable to assess  Buccal Region  Moderate depletion  Temple Region  Moderate depletion  Clavicle Bone Region  Moderate depletion  Clavicle and Acromion Bone Region  Severe depletion  Scapular Bone Region  Unable to assess  Dorsal Hand  Unable to assess  Patellar Region  Severe depletion  Anterior Thigh Region  Severe depletion  Posterior Calf Region  Moderate depletion  Edema (RD Assessment)  Moderate  Hair  Reviewed  Eyes  Reviewed  Mouth  Reviewed  Skin  Reviewed       Diet Order:  Diet Heart Room service appropriate? Yes; Fluid consistency: Thin; Fluid restriction: 1200 mL Fluid  EDUCATION NEEDS:   Not appropriate for education at this time  Skin:  Skin Assessment: Skin Integrity Issues: Skin Integrity Issues:: Stage I Stage I: coccyx  Last BM:  05/01/17  Height:   Ht Readings from Last 1 Encounters:  04/30/17 _0  (1.803 m)    Weight:   Wt Readings from Last 1 Encounters:  05/01/17 163 lb 2.3 oz (74 kg)    Ideal Body Weight:  78.2 kg  BMI:  Body mass index is 22.75 kg/m.  Estimated Nutritional Needs:   Kcal:  2100-2300 kcal/day  Protein:  105-115 g/day  Fluid:  >2.1 L/day    Mariana Single RD, LDN Clinical Nutrition Pager # [phone]

## 2017-05-02 NOTE — Progress Notes (Addendum)
[facility]  PROGRESS NOTE  [name] OZH:[id number] DOB: 05-May-1946 DOA: 04/30/2017 PCP: Biagio Borg, MD   Brief HPI:    71 y.o. male with medical history significant of COPD on 2 L nasal cannula oxygen, dCHF, prostate cancer, depression, anxiety, skin cancer, lymphoma on chemotherapy, cellulitis in groin area, a fib on Xarelto, who presents with syncope.     Subjective   Patient today complains of generalized body aches. Had a bowel movement last night in the bed, had to be cleaned and feels more pain after cleaned due to movement. Blood pressure has improved after putting TED hose and increasing Midodrin. Back pain has improved with Percocet and menthol muscle rub.   Assessment/Plan:     1. Syncope- lfrom orthostatic hypotension, systolic blood pressure dropped to 35/22, patient collapsed. Serum cortisol level this morning is 8.4. TED hose, dose of midodrine changed to 10 mg by mouth 3 times a day. Echocardiogram showed normal EF 55-60%, no wall motion abnormality. MRI brain shows no acute abnormality. Will check orthostatic vital signs in a.m. 2. Chronic diastolic CHF- patient has significant edema of all extremities. Patient was started on IV Lasix but due to severe hypertension have been discontinued. I don't feel that patient is having CHF exacerbation at this time. His edema is due to anasarca. Echocardiogram showed EF 55-60% 3. Elevated troponin- patient had mild elevation of troponin 0.08, likely from demand ischemia from significant hypotension. Patient denies chest pain. 4. Anasarca- from severe protein calorie malnutrition, nutrition consulted. Albumin is 1.9. 5. Atrial fibrillation- CHA2DS2-VASc Score is 2, patient is on Xarelto at home. Continue Xarelto. 6. 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 7. Cellulitis  in groin area- continue doxycycline. 8. COPD- continue prn albuterol. 9. Chronic back pain-  improved with Percocet 1-2 tablets every 6 hours when necessary, local muscle rub cream. 10. Constipation-  MiraLAX 17 g by mouth twice a day.  11. FOBT positive- hemoglobin is stable at 10.7, patient did have anal fissure from straining due to constipation as per patient's wife. Follow CBC in a.m.     DVT prophylaxis: Xarelto  Code Status: Full code  Family Communication: Discussed with patient's wife  at bedside.  Disposition Plan: Pending workup for syncope.   Consultants:  None  Procedures:  None  Continuous infusions . sodium chloride        Antibiotics:   Anti-infectives (From admission, onward)   Start     Dose/Rate Route Frequency Ordered Stop   04/30/17 2200  doxycycline (VIBRA-TABS) tablet 100 mg     100 mg Oral 2 times daily 04/30/17 1708 05/02/17 2359   04/30/17 1800  acyclovir (ZOVIRAX) tablet 400 mg     400 mg Oral Daily 04/30/17 1708         Objective   Vitals:   05/01/17 1736 05/01/17 2037 05/02/17 0653 05/02/17 1255  BP: (!) 91/55 93/62 (!) 100/53 (!) 91/52  Pulse:  66 73 65  Resp:  _0 Temp:  97.9 F (36.6 C) 97.6 F (36.4 C) 98.4 F (36.9 C)  TempSrc:  Oral Oral Oral  SpO2:  100% 93% 100%  Weight:      Height:        Intake/Output Summary (Last 24 hours) at 05/02/2017 1355 Last data filed at 05/02/2017 1000 Gross per 24 hour  Intake 580 ml  Output 750 ml  Net -170 ml   Filed Weights   04/30/17 2256 05/01/17 0500  Weight: 74.4 kg (164 lb 0.4 oz) 74 kg (163 lb 2.3 oz)     Physical Examination:   Physical Exam: Eyes: No icterus, extraocular muscles intact  Mouth: Oral mucosa is moist, no lesions on palate,  Neck: Supple, no deformities, masses, or tenderness Lungs: Normal respiratory effort, bilateral clear to auscultation, no crackles or wheezes.  Heart: Regular rate and rhythm, S1 and S2 normal, no murmurs, rubs auscultated Abdomen: BS normoactive,soft,nondistended,non-tender to palpation,no  organomegaly Extremities: No pretibial edema, no erythema, no cyanosis, no clubbing Neuro : Alert and oriented to time, place and person, No focal deficits     Data Reviewed: I have personally reviewed following labs and imaging studies  CBG: Recent Labs  Lab 04/30/17 1318  GLUCAP 79    CBC: Recent Labs  Lab 04/30/17 1327 05/02/17 0413  WBC 3.1* 3.3*  HGB 10.8* 10.7*  HCT 32.7* 32.0*  MCV 94.0 92.8  PLT 309 163    Basic Metabolic Panel: Recent Labs  Lab 04/30/17 1327 05/01/17 0515 05/02/17 0413  NA 135 133* 134*  K 4.1 4.3 3.6  CL 100* 97* 98*  CO2 _0 GLUCOSE 96 96 85  BUN 34* 34* 35*  CREATININE 1.06 1.27* 1.15  CALCIUM 7.8* 8.2* 8.2*    Recent Results (from the past 240 hour(s))  Blood culture (routine x 2)     Status: None   Collection Time: 04/25/17  1:00 PM  Result Value Ref Range Status   Specimen Description BLOOD RIGHT ANTECUBITAL  Final   Special Requests   Final    BOTTLES DRAWN AEROBIC AND ANAEROBIC Blood Culture adequate volume   Culture   Final    NO GROWTH 5 DAYS Performed at [facility], [place] [address] [place], [place] [address]    Report Status 04/30/2017 FINAL  Final  Blood culture (routine x 2)     Status: None   Collection Time: 04/25/17  1:25 PM  Result Value Ref Range Status   Specimen Description BLOOD RIGHT FOREARM  Final   Special Requests   Final    BOTTLES DRAWN AEROBIC AND ANAEROBIC Blood Culture adequate volume   Culture   Final    NO GROWTH 5 DAYS Performed at [facility], [place] [address], [place], [place] [address]    Report Status 04/30/2017 FINAL  Final     Liver Function Tests: No results for input(s): AST, ALT, ALKPHOS, BILITOT, PROT, ALBUMIN in the last 168 hours. No results for input(s): LIPASE, AMYLASE in the last 168 hours. No results for input(s): AMMONIA in the last 168 hours.  Cardiac Enzymes: Recent Labs  Lab 04/30/17 1806 04/30/17 2340 05/01/17 0515  TROPONINI 0.08* 0.08*  0.08*   BNP (last 3 results) Recent Labs    11/22/16 0318 02/21/17 1637 04/30/17 1327  BNP 678.9* 626.5* 680.0*    ProBNP (last 3 results) No results for input(s): PROBNP in the last 8760 hours.    Studies: Dg Chest 2 View  Result Date: 04/30/2017 CLINICAL DATA:  Back pain, body aches, near syncopal and syncopal episodes, hypotension. Patient has lymphoma and underwent second chemotherapy treatment last week but did not completed. Peripheral edema. History of CHF. EXAM: CHEST  2 VIEW COMPARISON:  Chest x-ray dated February 25, 2017 FINDINGS: The lungs are adequately inflated. There is a moderate-sized right pleural effusion which has increased slightly since the previous study. There is a tiny pleural effusion layering posteriorly. The cardiac silhouette is enlarged. The pulmonary vascularity  is engorged. The interstitial markings are increased. The mediastinum is normal in width. IMPRESSION: Findings compatible with congestive heart failure with interstitial edema and a moderate size right pleural effusion. Thoracic aortic atherosclerosis. Electronically Signed   By: David  Martinique M.D.   On: 04/30/2017 14:44   Mr Jeri Cos DG Contrast  Result Date: 04/30/2017 CLINICAL DATA:  Syncopal episode. On chemotherapy for lymphoma. Assess for stroke or metastasis. History of headache, prostate cancer, tobacco abuse. EXAM: MRI HEAD WITHOUT AND WITH CONTRAST TECHNIQUE: Multiplanar, multiecho pulse sequences of the brain and surrounding structures were obtained without and with intravenous contrast. CONTRAST:  66m MULTIHANCE GADOBENATE DIMEGLUMINE 529 MG/ML IV SOLN COMPARISON:  None. FINDINGS: Multiple sequences are moderately motion degraded. INTRACRANIAL CONTENTS: No reduced diffusion to suggest acute ischemia or hypercellular tumor. No susceptibility artifact to suggest hemorrhage. The ventricles and sulci are normal for patient's age. A few scattered subcentimeter supratentorial white matter FLAIR T2  hyperintensities compatible with mild chronic small vessel ischemic disease, less than expected for age. 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 No suspicious parenchymal signal, masses, mass effect. No abnormal intraparenchymal or extra-axial enhancement. No abnormal extra-axial fluid collections. No extra-axial masses. VASCULAR: Normal major intracranial vascular flow voids present at skull base. SKULL AND UPPER CERVICAL SPINE: Abnormally decreased signal RIGHT lateral mass and C1-2 articulation with enhancing effusion and arthropathy. No abnormal sellar expansion. No suspicious calvarial bone marrow signal. Craniocervical junction maintained. Small front and enhancing scalp nodule, possible subacute hematoma SINUSES/ORBITS: Mild paranasal sinus mucosal thickening with bilateral maxillary mucosal retention cyst. The included ocular globes and orbital contents are non-suspicious. OTHER: None. IMPRESSION: 1. No acute intracranial process or intracranial metastasis on this motion degraded examination. 2. Old small cerebellar infarcts, otherwise negative MRI of the head for age. 3. RIGHT C1-2 abnormal signal, arthropathy and enhancing effusion, likely reactive. Recommend dedicated CT cervical spine to evaluate osseous changes. Electronically Signed   By: CElon AlasM.D.   On: 04/30/2017 20:57    Scheduled Meds: . acyclovir  400 mg Oral Daily  . docusate sodium  100-300 mg Oral Daily  . doxycycline  100 mg Oral BID  . feeding supplement  1 Container Oral TID BM  . midodrine  10 mg Oral TID WC  . mirtazapine  7.5 mg Oral QHS  . polyethylene glycol  17 g Oral BID  . rivaroxaban  20 mg Oral Q supper  . senna-docusate  2 tablet Oral BID  . sodium chloride flush  3 mL Intravenous Q12H      Time spent: 20 min  [facility] Pager [phone] If 7PM-7AM, please contact night-coverage at [email or web address], Office  [phone] password [email or web address]  05/02/2017, 1:55 PM  LOS: 2 days

## 2017-05-02 NOTE — Progress Notes (Signed)
PT Cancellation Note  Patient Details Name: [name] MRN: [id number] DOB: November 01, 1945   Cancelled Treatment:    Reason Eval/Treat Not Completed: Fatigue/lethargy limiting ability to participate(wife reports pt had "very rough night with frequent BMs and pain" and needs to rest right now. Will try back later today or tomorrow. )   Philomena Doheny 05/02/2017, 9:20 AM [phone]

## 2017-05-02 NOTE — Progress Notes (Signed)
PT Cancellation Note  Patient Details Name: [name] MRN: [id number] DOB: [birth year]   Cancelled Treatment:    Reason Eval/Treat Not Completed: Pain limiting ability to participate(per RN pt is having significant pain "all over with just light touch". RN requested PT hold until tomorrow. Will follow. )   Philomena Doheny 05/02/2017, 1:42 PM [phone]

## 2017-05-03 LAB — BASIC METABOLIC PANEL
Anion gap: 6 (ref 5–15)
BUN: 29 mg/dL — ABNORMAL HIGH (ref 6–20)
CO2: 30 mmol/L (ref 22–32)
Calcium: 8.2 mg/dL — ABNORMAL LOW (ref 8.9–10.3)
Chloride: 99 mmol/L — ABNORMAL LOW (ref 101–111)
Creatinine, Ser: 1.11 mg/dL (ref 0.61–1.24)
GFR calc non Af Amer: >60 mL/min (ref >60)
Glucose, Bld: 80 mg/dL (ref 65–99)
Potassium: 3.7 mmol/L (ref 3.5–5.1)
SODIUM: 135 mmol/L (ref 135–145)

## 2017-05-03 LAB — URINE CULTURE: Culture: NO GROWTH

## 2017-05-03 MED ORDER — POLYETHYLENE GLYCOL 3350 17 G PO PACK: 17.0000 g | PACK | Freq: Every day | ORAL | Status: DC | PRN

## 2017-05-03 NOTE — Progress Notes (Signed)
Patient ID: [name], male   DOB: 09/09/1945, 71 y.o.   MRN: [id number]  PROGRESS NOTE    [name]  TMA:[id number] DOB: June 10, 1945 DOA: 04/30/2017 PCP: Biagio Borg, MD   Brief Narrative:  72 year old male with history of COPD, chronic hypoxic respiratory failure on home oxygen, chronic diastolic CHF, prostate cancer, depression, anxiety, skin cancer, lymphoma on chemotherapy, cellulitis and groin area, A. fib on Xarelto presented with syncope.   Assessment & Plan:   Principal Problem:   Acute on chronic diastolic (congestive) heart failure (HCC) Active Problems:   Protein-calorie malnutrition, severe (HCC)   Persistent atrial fibrillation (HCC)   Malignant lymphoplasmacytic lymphoma (HCC)   Syncope and collapse   Pressure injury of skin   Malnutrition of moderate degree     1. Syncope- probably from orthostatic hypotension, systolic blood pressure dropped to 35/22, patient collapsed. Serum cortisol level  8.4. TED hose; continue midodrine10 mg by mouth 3 times a day. Echocardiogram showed normal EF 55-60%, no wall motion abnormality. MRI brain showed no acute abnormality.  Ultrasound carotids did not show significant stenosis. check orthostatic vitals.  PT eval  2. chronic diastolic CHF-currently no signs of volume overload.  Lower extremity edema has improved.  Lasix has been discontinued due to hypotension. . His edema is due to anasarca. Echocardiogram showed EF 55-60% 3. Elevated troponin- patient had mild elevation of troponin 0.08, likely from demand ischemia from significant hypotension. Patient denies chest pain.  Outpatient follow-up with cardiology 4. Anasarca- from severe protein calorie malnutrition.  Follow nutrition recommendations  5. atrial fibrillation- CHA2DS2-VASc Scoreis 2, Continue Xarelto.  Rate controlled 6. 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 7. Cellulitis  in groin area- continue  doxycycline. 8. COPD- continue prn albuterol. 9. Chronic back pain- improved with Percocet 1-2 tablets every 6 hours when necessary, local muscle rub cream. 10. Constipation-  continue MiraLAX  11. FOBT positive- hemoglobin is stable at 10.7, patient did have anal fissure from straining due to constipation as per patient's wife. Follow CBC in a.m.   DVT prophylaxis: Xarelto Code Status: Full Family Communication: Spoke to patient's wife at bedside Disposition Plan: Home in 1-2 days  Consultants: None  Procedures:  Echo on 05/01/2017 Study Conclusions  - Left ventricle: The cavity size was normal. There was moderate   focal basal hypertrophy of the septum. Systolic function was   normal. The estimated ejection fraction was in the range of 55%   to 60%. Wall motion was normal; there were no regional wall   motion abnormalities. - Aortic valve: Transvalvular velocity was within the normal range.   There was no stenosis. There was no regurgitation. - Mitral valve: Mildly calcified annulus. Transvalvular velocity   was within the normal range. There was no evidence for stenosis.   There was trivial regurgitation. - Left atrium: The atrium was severely dilated. - Right ventricle: The cavity size was normal. Wall thickness was   normal. Systolic function was normal. - Atrial septum: There appears to be a patent foramen ovale with   left to right flow at rest. - Tricuspid valve: There was mild regurgitation. - Pulmonary arteries: Systolic pressure was within the normal   range. PA peak pressure: 27 mm Hg (S).  Bilateral carotid duplex ultrasound on 05/01/2017 Final Interpretation: Right Carotid: There is evidence in the right ICA of a 1-39% stenosis.  Left Carotid: There is evidence in the left ICA of a 1-39% stenosis.  Vertebrals: Both  vertebral arteries were patent with antegrade flow.  Antimicrobials: Doxycycline   Subjective: Patient seen and examined at bedside.  He  feels very weak and tired.  His appetite is slightly improved.  No overnight fever or vomiting.  No chest pain.  Objective: Vitals:   05/02/17 1255 05/02/17 2100 05/03/17 0445 05/03/17 1400  BP: (!) 91/52 114/70 91/61 (!) 89/52  Pulse: 65 66 64 (!) 55  Resp: _0 Temp: 98.4 F (36.9 C) 98.6 F (37 C) 97.9 F (36.6 C) 97.7 F (36.5 C)  TempSrc: Oral Oral Oral Oral  SpO2: 100% 99% 98% 100%  Weight:   72.1 kg (159 lb)   Height:        Intake/Output Summary (Last 24 hours) at 05/03/2017 1512 Last data filed at 05/03/2017 1402 Gross per 24 hour  Intake 360 ml  Output 600 ml  Net -240 ml   Filed Weights   04/30/17 2256 05/01/17 0500 05/03/17 0445  Weight: 74.4 kg (164 lb 0.4 oz) 74 kg (163 lb 2.3 oz) 72.1 kg (159 lb)    Examination:  General exam: Looks older than stated age.  No acute distress  respiratory system: Bilateral decreased breath sound at bases Cardiovascular system: S1 & S2 heard, controlled  gastrointestinal system: Abdomen is nondistended, soft and nontender. Normal bowel sounds heard. Extremities: No cyanosis, clubbing; trace edema  Data Reviewed: I have personally reviewed following labs and imaging studies  CBC: Recent Labs  Lab 04/30/17 1327 05/02/17 0413  WBC 3.1* 3.3*  HGB 10.8* 10.7*  HCT 32.7* 32.0*  MCV 94.0 92.8  PLT 309 588   Basic Metabolic Panel: Recent Labs  Lab 04/30/17 1327 05/01/17 0515 05/02/17 0413 05/03/17 0510  NA 135 133* 134* 135  K 4.1 4.3 3.6 3.7  CL 100* 97* 98* 99*  CO2 _1 GLUCOSE 96 96 85 80  BUN 34* 34* 35* 29*  CREATININE 1.06 1.27* 1.15 1.11  CALCIUM 7.8* 8.2* 8.2* 8.2*   GFR: Estimated Creatinine Clearance: 60.4 mL/min (by C-G formula based on SCr of 1.11 mg/dL). Liver Function Tests: No results for input(s): AST, ALT, ALKPHOS, BILITOT, PROT, ALBUMIN in the last 168 hours. No results for input(s): LIPASE, AMYLASE in the last 168 hours. No results for input(s): AMMONIA in the last 168  hours. Coagulation Profile: No results for input(s): INR, PROTIME in the last 168 hours. Cardiac Enzymes: Recent Labs  Lab 04/30/17 1806 04/30/17 2340 05/01/17 0515  TROPONINI 0.08* 0.08* 0.08*   BNP (last 3 results) No results for input(s): PROBNP in the last 8760 hours. HbA1C: No results for input(s): HGBA1C in the last 72 hours. CBG: Recent Labs  Lab 04/30/17 1318  GLUCAP 79   Lipid Profile: No results for input(s): CHOL, HDL, LDLCALC, TRIG, CHOLHDL, LDLDIRECT in the last 72 hours. Thyroid Function Tests: No results for input(s): TSH, T4TOTAL, FREET4, T3FREE, THYROIDAB in the last 72 hours. Anemia Panel: No results for input(s): VITAMINB12, FOLATE, FERRITIN, TIBC, IRON, RETICCTPCT in the last 72 hours. Sepsis Labs: No results for input(s): PROCALCITON, LATICACIDVEN in the last 168 hours.  Recent Results (from the past 240 hour(s))  Blood culture (routine x 2)     Status: None   Collection Time: 04/25/17  1:00 PM  Result Value Ref Range Status   Specimen Description BLOOD RIGHT ANTECUBITAL  Final   Special Requests   Final    BOTTLES DRAWN AEROBIC AND ANAEROBIC Blood Culture adequate volume   Culture  Final    NO GROWTH 5 DAYS Performed at [facility], [place] [address], [place], [place] [address]    Report Status 04/30/2017 FINAL  Final  Blood culture (routine x 2)     Status: None   Collection Time: 04/25/17  1:25 PM  Result Value Ref Range Status   Specimen Description BLOOD RIGHT FOREARM  Final   Special Requests   Final    BOTTLES DRAWN AEROBIC AND ANAEROBIC Blood Culture adequate volume   Culture   Final    NO GROWTH 5 DAYS Performed at [facility], [place] [address] [place], [place] [address]    Report Status 04/30/2017 FINAL  Final  Culture, Urine     Status: None   Collection Time: 05/02/17 10:57 AM  Result Value Ref Range Status   Specimen Description URINE, CLEAN CATCH  Final   Special Requests NONE  Final   Culture   Final    NO  GROWTH Performed at [facility], [place] [address] [place], [place] [address]    Report Status 05/03/2017 FINAL  Final         Radiology Studies: No results found.      Scheduled Meds: . acyclovir  400 mg Oral Daily  . docusate sodium  100-300 mg Oral Daily  . feeding supplement  1 Container Oral TID BM  . midodrine  10 mg Oral TID WC  . mirtazapine  7.5 mg Oral QHS  . polyethylene glycol  17 g Oral BID  . rivaroxaban  20 mg Oral Q supper  . senna-docusate  2 tablet Oral BID  . sodium chloride flush  3 mL Intravenous Q12H   Continuous Infusions: . sodium chloride       LOS: 3 days        Aline August, MD [facility] Pager [phone]  If 7PM-7AM, please contact night-coverage [email or web address] Password [email or web address] 05/03/2017, 3:12 PM

## 2017-05-03 NOTE — Evaluation (Signed)
Physical Therapy Evaluation Patient Details Name: [name] MRN: [id number] DOB: 23-Jul-1945 Today's Date: 05/03/2017   History of Present Illness  71 year old male with history of COPD, chronic hypoxic respiratory failure on home oxygen, chronic diastolic CHF, prostate cancer, depression, anxiety, skin cancer, lymphoma on chemotherapy, cellulitis and groin area, A. fib on Xarelto presented with syncope.  Clinical Impression  Pt admitted with above diagnosis. Pt currently with functional limitations due to the deficits listed below (see PT Problem List).  Pt will benefit from skilled PT to increase their independence and safety with mobility to allow discharge to the venue listed below. Pt cannot tolerate upright position > 15 minutes due to orthostatic hypotension.  Wife would like to take pt home at time of d/c if able.  If she does, recommend w/c and hospital bed.  He would also need ambulance transfer home as there are 5 steps to enter and she states it was taking 3 people to help him even when he was ambulating.  Pt discussed the need for a ramp as a long term solution.  Did discuss possibility of SNF with pt and wife and will need to continue to assess progress during acute stay.  ORTHOSTATICS WITH PT: Supine: 97/62 HR 57 Sitting: 74/44, HR 73 Sitting 5 mins: 83/63 HR 57  Sitting 15 mins 61/42 HR 76 (returned supine) Supine 101/66 HR 62     Follow Up Recommendations Home health PT;Supervision/Assistance - 24 hour(if progresses, if not need to consider SNF)    Equipment Recommendations  Wheelchair (measurements PT);Hospital bed    Recommendations for Other Services       Precautions / Restrictions Precautions Precautions: Fall Restrictions Weight Bearing Restrictions: No      Mobility  Bed Mobility Overal bed mobility: Needs Assistance Bed Mobility: Sit to Supine;Supine to Sit     Supine to sit: Min guard Sit to supine: Min assist   [name] bed mobility  comments: slow transition from supine > sit due to back pain.  A for LE for sit > supine  Transfers                 [name] transfer comment: deferred sue to BP  Ambulation/Gait                Stairs            Wheelchair Mobility    Modified Rankin (Stroke Patients Only)       Balance Overall balance assessment: Needs assistance Sitting-balance support: Feet supported;No upper extremity supported Sitting balance-Leahy Scale: Fair Sitting balance - Comments: sits flexed                                     Pertinent Vitals/Pain Pain Assessment: 0-10 Pain Score: 5  Pain Location: chronic back pain Pain Descriptors / Indicators: Constant Pain Intervention(s): Monitored during session    Home Living Family/patient expects to be discharged to:: Private residence Living Arrangements: Spouse/significant other Available Help at Discharge: Family Type of Home: House Home Access: Stairs to enter Entrance Stairs-Rails: Left Entrance Stairs-Number of Steps: 5 Home Layout: One level Home Equipment: Environmental consultant - 2 wheels;Bedside commode;Shower seat Additional Comments: Wife reports it was taking 3 people to get him in and out of the house when he could walk.    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     Hand Dominance        Extremity/Trunk Assessment   Upper Extremity Assessment Upper Extremity Assessment: Generalized weakness    Lower Extremity Assessment Lower Extremity Assessment: Generalized weakness    Cervical / Trunk Assessment Cervical / Trunk Assessment: Kyphotic  Communication      Cognition Arousal/Alertness: Awake/alert Behavior During Therapy: WFL for tasks assessed/performed Overall Cognitive Status:  Within Functional Limits for tasks assessed                                        [name] Comments [name] comments (skin integrity, edema, etc.): see above note for orthostatics    Exercises [name] Exercises - Lower Extremity Ankle Circles/Pumps: AROM;Both;10 reps;Seated Long Arc Quad: AROM;10 reps;Seated Hip Flexion/Marching: AROM;20 reps;Seated   Assessment/Plan    PT Assessment Patient needs continued PT services  PT Problem List Decreased strength;Decreased range of motion;Decreased activity tolerance;Decreased balance;Decreased mobility;Cardiopulmonary status limiting activity;Pain       PT Treatment Interventions Functional mobility training;Therapeutic activities;Therapeutic exercise;Patient/family education    PT Goals (Current goals can be found in the Care Plan section)  Acute Rehab PT Goals Patient Stated Goal: wife would like to take pt home instead of SNF. PT Goal Formulation: With patient/family Time For Goal Achievement: 05/17/17 Potential to Achieve Goals: Good    Frequency Min 3X/week   Barriers to discharge        Co-evaluation               AM-PAC PT "6 Clicks" Daily Activity  Outcome Measure Difficulty turning over in bed (including adjusting bedclothes, sheets and blankets)?: A Lot Difficulty moving from lying on back to sitting on the side of the bed? : A Lot Difficulty sitting down on and standing up from a chair with arms (e.g., wheelchair, bedside commode, etc,.)?: Unable Help needed moving to and from a bed to chair (including a wheelchair)?: Total Help needed walking in hospital room?: Total Help needed climbing 3-5 steps with a railing? : Total 6 Click Score: 8    End of Session Equipment Utilized During Treatment: Gait belt Activity Tolerance: Patient limited by fatigue;Patient limited by pain;Treatment limited secondary to medical complications (Comment)(hypotensive) Patient left: in bed Nurse Communication:  Mobility status;Other (comment)(BPs) PT Visit Diagnosis: Muscle weakness (generalized) (M62.81);History of falling (Z91.81)    Time: 1021-1173 PT Time Calculation (min) (ACUTE ONLY): 50 min   Charges:   PT Evaluation $PT Eval Moderate Complexity: 1 Mod PT Treatments $Therapeutic Activity: 23-37 mins   PT G Codes:        Arissa Fagin L. Tamala Julian, [place] Pager [phone] 05/03/2017   Galen Manila 05/03/2017, 4:28 PM

## 2017-05-03 NOTE — Progress Notes (Signed)
Wife called tonight and stated she did not want anybody getting him out of bed at least until late morning.

## 2017-05-04 LAB — COMPREHENSIVE METABOLIC PANEL
ALBUMIN: 1.5 g/dL — AB (ref 3.5–5.0)
ALK PHOS: 176 U/L — AB (ref 38–126)
ALT: 12 U/L — ABNORMAL LOW (ref 17–63)
ANION GAP: 5 (ref 5–15)
AST: 22 U/L (ref 15–41)
BUN: 27 mg/dL — ABNORMAL HIGH (ref 6–20)
CHLORIDE: 100 mmol/L — AB (ref 101–111)
CO2: 30 mmol/L (ref 22–32)
Calcium: 8 mg/dL — ABNORMAL LOW (ref 8.9–10.3)
Creatinine, Ser: 1.08 mg/dL (ref 0.61–1.24)
GFR calc non Af Amer: >60 mL/min (ref >60)
GLUCOSE: 85 mg/dL (ref 65–99)
POTASSIUM: 3.8 mmol/L (ref 3.5–5.1)
SODIUM: 135 mmol/L (ref 135–145)
Total Bilirubin: 0.6 mg/dL (ref 0.3–1.2)
Total Protein: 4.3 g/dL — ABNORMAL LOW (ref 6.5–8.1)

## 2017-05-04 LAB — CBC WITH DIFFERENTIAL/PLATELET
BASOS PCT: 2 %
Basophils Absolute: 0.1 10*3/uL (ref 0.0–0.1)
EOS ABS: 0.2 10*3/uL (ref 0.0–0.7)
EOS PCT: 7 %
HCT: 35 % — ABNORMAL LOW (ref 39.0–52.0)
HEMOGLOBIN: 11.5 g/dL — AB (ref 13.0–17.0)
LYMPHS ABS: 0.5 10*3/uL — AB (ref 0.7–4.0)
Lymphocytes Relative: 14 %
MCH: 31.3 pg (ref 26.0–34.0)
MCHC: 32.9 g/dL (ref 30.0–36.0)
MCV: 95.1 fL (ref 78.0–100.0)
MONOS PCT: 15 %
Monocytes Absolute: 0.5 10*3/uL (ref 0.1–1.0)
NEUTROS PCT: 62 %
Neutro Abs: 2.1 10*3/uL (ref 1.7–7.7)
PLATELETS: 298 10*3/uL (ref 150–400)
RBC: 3.68 MIL/uL — ABNORMAL LOW (ref 4.22–5.81)
RDW: 17.1 % — ABNORMAL HIGH (ref 11.5–15.5)
WBC: 3.5 10*3/uL — AB (ref 4.0–10.5)

## 2017-05-04 LAB — MAGNESIUM: Magnesium: 1.5 mg/dL — ABNORMAL LOW (ref 1.7–2.4)

## 2017-05-04 MED ORDER — MAGNESIUM SULFATE 2 GM/50ML IV SOLN
2.0000 g | Freq: Once | INTRAVENOUS | Status: AC
  Administered 2017-05-04: 2 g via INTRAVENOUS
  Filled 2017-05-04: qty 50

## 2017-05-04 NOTE — Progress Notes (Signed)
Physical Therapy Treatment Patient Details Name: [name] MRN: [id number] DOB: 10/05/1945 Today's Date: 05/04/2017    History of Present Illness 71 year old male with history of COPD, chronic hypoxic respiratory failure on home oxygen, chronic diastolic CHF, prostate cancer, depression, anxiety, skin cancer, lymphoma on chemotherapy, cellulitis and groin area, A. fib on Xarelto presented with syncope.    PT Comments    Assisted to EOB and standing while taking vitals Supine    BP 91/52         HR 62   RA 99% EOB       BP 70/43          HR 63   RA 98% Standing  BP 52/27(MAP 33)  HR 75  RA  85%  Unable to attempt amb, returned pt to supine.  Pt stated, "I feel real weak".   Follow Up Recommendations  Home health PT;Supervision/Assistance - 24 hour     Equipment Recommendations  Wheelchair (measurements PT);Hospital bed    Recommendations for Other Services       Precautions / Restrictions Precautions Precautions: Fall Precaution Comments: monitor BP    Mobility  Bed Mobility Overal bed mobility: Needs Assistance Bed Mobility: Sit to Supine;Supine to Sit     Supine to sit: Min guard Sit to supine: Min assist   General bed mobility comments: slow transition from supine > sit due to back pain.  A for LE for sit > supine  Transfers Overall transfer level: Needs assistance Equipment used: Rolling walker (2 wheeled) Transfers: Sit to/from Stand Sit to Stand: Min assist;+2 physical assistance;+2 safety/equipment         General transfer comment: sit to stand only due to low BP  Ambulation/Gait             General Gait Details: did not attempt amb due to low BP   Stairs            Wheelchair Mobility    Modified Rankin (Stroke Patients Only)       Balance                                            Cognition Arousal/Alertness: Awake/alert Behavior During Therapy: WFL for tasks assessed/performed Overall Cognitive  Status: Within Functional Limits for tasks assessed                                        Exercises      General Comments        Pertinent Vitals/Pain Pain Location: chronic back pain Pain Descriptors / Indicators: Constant    Home Living                      Prior Function            PT Goals (current goals can now be found in the care plan section) Progress towards PT goals: Progressing toward goals    Frequency    Min 3X/week      PT Plan Current plan remains appropriate    Co-evaluation              AM-PAC PT "6 Clicks" Daily Activity  Outcome Measure  Difficulty turning over in bed (including adjusting bedclothes, sheets and blankets)?: A Lot Difficulty moving  from lying on back to sitting on the side of the bed? : A Lot Difficulty sitting down on and standing up from a chair with arms (e.g., wheelchair, bedside commode, etc,.)?: Unable Help needed moving to and from a bed to chair (including a wheelchair)?: Total Help needed walking in hospital room?: Total Help needed climbing 3-5 steps with a railing? : Total 6 Click Score: 8    End of Session Equipment Utilized During Treatment: Gait belt Activity Tolerance: Patient limited by fatigue;Patient limited by pain;Treatment limited secondary to medical complications (Comment) Patient left: in bed   PT Visit Diagnosis: Muscle weakness (generalized) (M62.81);History of falling (Z91.81)     Time: 1110-1135 PT Time Calculation (min) (ACUTE ONLY): 25 min  Charges:  $Therapeutic Activity: 23-37 mins                    G Codes:       Rica Koyanagi  PTA [facility]  Acute  Rehab Pager      [phone]

## 2017-05-04 NOTE — Progress Notes (Signed)
Patient ID: [name], male   DOB: 08-May-1946, 71 y.o.   MRN: [id number]  PROGRESS NOTE    [name]  GQQ:[id number] DOB: 01/15/1946 DOA: 04/30/2017 PCP: Biagio Borg, MD   Brief Narrative:  71 year old male with history of COPD, chronic hypoxic respiratory failure on home oxygen, chronic diastolic CHF, prostate cancer, depression, anxiety, skin cancer, lymphoma on chemotherapy, cellulitis and groin area, A. fib on Xarelto presented with syncope. It was initially thought that patient had volume overload and was started on Lasix. Lower extremity edema improved, Lasix was discontinued because of hypotension. Patient is still very deconditioned.   Assessment & Plan:   Principal Problem:   Acute on chronic diastolic (congestive) heart failure (HCC) Active Problems:   Protein-calorie malnutrition, severe (HCC)   Persistent atrial fibrillation (HCC)   Malignant lymphoplasmacytic lymphoma (HCC)   Syncope and collapse   Pressure injury of skin   Malnutrition of moderate degree     1. Syncope- probably from orthostatic hypotension, systolic blood pressure dropped to 35/22, patient collapsed. Serum cortisol level  8.4. TED hose; continue midodrine10 mg by mouth 3 times a day. Echocardiogram showed normal EF 55-60%, no wall motion abnormality. MRI brain showed no acute abnormality.  Ultrasound carotids did not show significant stenosis. check orthostatic vitals.  PT eval  2. chronic diastolic CHF-currently no signs of volume overload.  Lower extremity edema has improved; patient has some upper extremity edema.  Lasix has been discontinued due to hypotension. If blood pressure allows, low dose Lasix can be restarted for his upper extremity edema. His edema is due to anasarca. Echocardiogram showed EF 55-60% 3. Elevated troponin- patient had mild elevation of troponin 0.08, likely from demand ischemia from significant hypotension. Patient denies chest pain.  Outpatient follow-up with  cardiology 4. Anasarca- from severe protein calorie malnutrition.  Follow nutrition recommendations  5. atrial fibrillation- CHA2DS2-VASc Scoreis 2, Continue Xarelto.  Rate controlled 6. 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 7. Cellulitis  in groin area-  status post treatment with doxycycline 8. COPD- continue prn albuterol. 9. Chronic back pain- improved with Percocet 1-2 tablets every 6 hours when necessary, local muscle rub cream. 10. Constipation-  continue MiraLAX  11. FOBT positive- hemoglobin is stable, patient did have anal fissure from straining due to constipation as per patient's wife. Follow CBC in a.m. 12. Hypomagnesemia-replace. Repeat a.m. labs   DVT prophylaxis: Xarelto Code Status: Full Family Communication: Spoke to patient's wife at bedside Disposition Plan: Home versus nursing home in 1-2 days  Consultants: None  Procedures:  Echo on 05/01/2017 Study Conclusions  - Left ventricle: The cavity size was normal. There was moderate   focal basal hypertrophy of the septum. Systolic function was   normal. The estimated ejection fraction was in the range of 55%   to 60%. Wall motion was normal; there were no regional wall   motion abnormalities. - Aortic valve: Transvalvular velocity was within the normal range.   There was no stenosis. There was no regurgitation. - Mitral valve: Mildly calcified annulus. Transvalvular velocity   was within the normal range. There was no evidence for stenosis.   There was trivial regurgitation. - Left atrium: The atrium was severely dilated. - Right ventricle: The cavity size was normal. Wall thickness was   normal. Systolic function was normal. - Atrial septum: There appears to be a patent foramen ovale with   left to right flow at rest. - Tricuspid valve: There was mild  regurgitation. - Pulmonary arteries: Systolic pressure was within the normal   range. PA peak  pressure: 27 mm Hg (S).  Bilateral carotid duplex ultrasound on 05/01/2017 Final Interpretation: Right Carotid: There is evidence in the right ICA of a 1-39% stenosis.  Left Carotid: There is evidence in the left ICA of a 1-39% stenosis.  Vertebrals: Both vertebral arteries were patent with antegrade flow.  Antimicrobials: Off Doxycycline   Subjective: Patient seen and examined at bedside.  He feels very weak and tired. He had a rough night. His appetite is slightly improved.  No overnight fever or vomiting.  No chest pain.  Objective: Vitals:   05/03/17 1400 05/03/17 2214 05/04/17 0500 05/04/17 0518  BP: (!) 89/52 (!) 84/48    Pulse: (!) 55 65    Resp: _0 Temp: 97.7 F (36.5 C) 98.9 F (37.2 C)    TempSrc: Oral Oral    SpO2: 100% 100%  100%  Weight:   71.3 kg (157 lb 3 oz)   Height:        Intake/Output Summary (Last 24 hours) at 05/04/2017 1047 Last data filed at 05/03/2017 2215 Gross per 24 hour  Intake 180 ml  Output 525 ml  Net -345 ml   Filed Weights   05/01/17 0500 05/03/17 0445 05/04/17 0500  Weight: 74 kg (163 lb 2.3 oz) 72.1 kg (159 lb) 71.3 kg (157 lb 3 oz)    Examination:  General exam: Looks older than stated age.  No acute distress  respiratory system: Bilateral decreased breath sounds at bases Cardiovascular system: S1 & S2 heard, controlled  gastrointestinal system: Abdomen is nondistended, soft and nontender. Normal bowel sounds heard. Extremities: No cyanosis, clubbing; no lower extremity edema. 1+ upper extremity edema   Data Reviewed: I have personally reviewed following labs and imaging studies  CBC: Recent Labs  Lab 04/30/17 1327 05/02/17 0413 05/04/17 0813  WBC 3.1* 3.3* 3.5*  NEUTROABS  --   --  2.1  HGB 10.8* 10.7* 11.5*  HCT 32.7* 32.0* 35.0*  MCV 94.0 92.8 95.1  PLT 309 307 230   Basic Metabolic Panel: Recent Labs  Lab 04/30/17 1327 05/01/17 0515 05/02/17 0413 05/03/17 0510 05/04/17 0813  NA 135 133* 134*  135 135  K 4.1 4.3 3.6 3.7 3.8  CL 100* 97* 98* 99* 100*  CO2 _1 GLUCOSE 96 96 85 80 85  BUN 34* 34* 35* 29* 27*  CREATININE 1.06 1.27* 1.15 1.11 1.08  CALCIUM 7.8* 8.2* 8.2* 8.2* 8.0*  MG  --   --   --   --  1.5*   GFR: Estimated Creatinine Clearance: 61.4 mL/min (by C-G formula based on SCr of 1.08 mg/dL). Liver Function Tests: Recent Labs  Lab 05/04/17 0813  AST 22  ALT 12*  ALKPHOS 176*  BILITOT 0.6  PROT 4.3*  ALBUMIN 1.5*   No results for input(s): LIPASE, AMYLASE in the last 168 hours. No results for input(s): AMMONIA in the last 168 hours. Coagulation Profile: No results for input(s): INR, PROTIME in the last 168 hours. Cardiac Enzymes: Recent Labs  Lab 04/30/17 1806 04/30/17 2340 05/01/17 0515  TROPONINI 0.08* 0.08* 0.08*   BNP (last 3 results) No results for input(s): PROBNP in the last 8760 hours. HbA1C: No results for input(s): HGBA1C in the last 72 hours. CBG: Recent Labs  Lab 04/30/17 1318  GLUCAP 79   Lipid Profile: No results for input(s): CHOL, HDL, LDLCALC, TRIG, CHOLHDL, LDLDIRECT  in the last 72 hours. Thyroid Function Tests: No results for input(s): TSH, T4TOTAL, FREET4, T3FREE, THYROIDAB in the last 72 hours. Anemia Panel: No results for input(s): VITAMINB12, FOLATE, FERRITIN, TIBC, IRON, RETICCTPCT in the last 72 hours. Sepsis Labs: No results for input(s): PROCALCITON, LATICACIDVEN in the last 168 hours.  Recent Results (from the past 240 hour(s))  Blood culture (routine x 2)     Status: None   Collection Time: 04/25/17  1:00 PM  Result Value Ref Range Status   Specimen Description BLOOD RIGHT ANTECUBITAL  Final   Special Requests   Final    BOTTLES DRAWN AEROBIC AND ANAEROBIC Blood Culture adequate volume   Culture   Final    NO GROWTH 5 DAYS Performed at [facility], [address], [place], [place] [address]    Report Status 04/30/2017 FINAL  Final  Blood culture (routine x 2)     Status: None    Collection Time: 04/25/17  1:25 PM  Result Value Ref Range Status   Specimen Description BLOOD RIGHT FOREARM  Final   Special Requests   Final    BOTTLES DRAWN AEROBIC AND ANAEROBIC Blood Culture adequate volume   Culture   Final    NO GROWTH 5 DAYS Performed at [facility], [place] [address] [place], [place] [address]    Report Status 04/30/2017 FINAL  Final  Culture, Urine     Status: None   Collection Time: 05/02/17 10:57 AM  Result Value Ref Range Status   Specimen Description URINE, CLEAN CATCH  Final   Special Requests NONE  Final   Culture   Final    NO GROWTH Performed at [facility], [place] [address] [place], [place] [address]    Report Status 05/03/2017 FINAL  Final         Radiology Studies: No results found.      Scheduled Meds: . acyclovir  400 mg Oral Daily  . docusate sodium  100-300 mg Oral Daily  . feeding supplement  1 Container Oral TID BM  . midodrine  10 mg Oral TID WC  . mirtazapine  7.5 mg Oral QHS  . rivaroxaban  20 mg Oral Q supper  . senna-docusate  2 tablet Oral BID  . sodium chloride flush  3 mL Intravenous Q12H   Continuous Infusions: . sodium chloride       LOS: 4 days        Aline August, MD [facility] Pager [phone]  If 7PM-7AM, please contact night-coverage [email or web address] Password [facility] 05/04/2017, 10:47 AM

## 2017-05-04 NOTE — Plan of Care (Signed)
  Pain Managment: General experience of comfort will improve 05/04/2017 0236 - Progressing by Mickie Kay, RN

## 2017-05-05 DIAGNOSIS — I5033 Acute on chronic diastolic (congestive) heart failure: Secondary | ICD-10-CM

## 2017-05-05 LAB — BASIC METABOLIC PANEL
Anion gap: 6 (ref 5–15)
BUN: 27 mg/dL — AB (ref 6–20)
CALCIUM: 8.2 mg/dL — AB (ref 8.9–10.3)
CO2: 30 mmol/L (ref 22–32)
Chloride: 100 mmol/L — ABNORMAL LOW (ref 101–111)
Creatinine, Ser: 1.08 mg/dL (ref 0.61–1.24)
GFR calc Af Amer: >60 mL/min (ref >60)
GLUCOSE: 81 mg/dL (ref 65–99)
Potassium: 4.1 mmol/L (ref 3.5–5.1)
Sodium: 136 mmol/L (ref 135–145)

## 2017-05-05 LAB — CBC WITH DIFFERENTIAL/PLATELET
BASOS ABS: 0.1 10*3/uL (ref 0.0–0.1)
BASOS PCT: 2 %
EOS PCT: 10 %
Eosinophils Absolute: 0.3 10*3/uL (ref 0.0–0.7)
HCT: 36.5 % — ABNORMAL LOW (ref 39.0–52.0)
Hemoglobin: 12.2 g/dL — ABNORMAL LOW (ref 13.0–17.0)
Lymphocytes Relative: 18 %
Lymphs Abs: 0.6 10*3/uL — ABNORMAL LOW (ref 0.7–4.0)
MCH: 31.7 pg (ref 26.0–34.0)
MCHC: 33.4 g/dL (ref 30.0–36.0)
MCV: 94.8 fL (ref 78.0–100.0)
MONO ABS: 0.4 10*3/uL (ref 0.1–1.0)
Monocytes Relative: 12 %
Neutro Abs: 2 10*3/uL (ref 1.7–7.7)
Neutrophils Relative %: 58 %
PLATELETS: 285 10*3/uL (ref 150–400)
RBC: 3.85 MIL/uL — ABNORMAL LOW (ref 4.22–5.81)
RDW: 17.3 % — AB (ref 11.5–15.5)
WBC: 3.3 10*3/uL — ABNORMAL LOW (ref 4.0–10.5)

## 2017-05-05 LAB — ALBUMIN: Albumin: 1.5 g/dL — ABNORMAL LOW (ref 3.5–5.0)

## 2017-05-05 MED ORDER — ALBUMIN HUMAN 25 % IV SOLN
25.0000 g | Freq: Four times a day (QID) | INTRAVENOUS | Status: AC
  Administered 2017-05-05 (×3): 25 g via INTRAVENOUS
  Filled 2017-05-05 (×3): qty 100

## 2017-05-05 NOTE — Consult Note (Signed)
Cardiology Consultation:   Patient ID: [name]; [id number]; 11/26/1945   Admit date: 04/30/2017 Date of Consult: 05/05/2017  Primary Care Provider: Biagio Borg, MD Primary Cardiologist: Dr Aundra Dubin   Patient Profile:   [name] is a 71 y.o. male with a hx of COPD, chronic hypoxic respiratory failure on home oxygen, chronic diastolic CHF, prostate cancer, depression, anxiety, skin cancer, lymphoma on chemotherapy, cellulitis of groin area, atrial fib on Xarelto who is being seen today for the evaluation of CHF and persistent orthostatic hypotension and syncope at the request of Dr. Starla Link.  History of Present Illness:   [name] was brought into [facility] ED by his family on 04/30/17 for feeling weak and having a poor appetite recently. He had had 2 episodes of syncope during the previous day. He passed out once when he first got up from bed in the morning prior to his first dose of midodrine. He reportedly had no seizure like activity. He had no stroke findings. The patient denied shortness of breath but was found to have an oxygen saturation level of 70's-80's on room air while recumbent. This improved with sitting up. He denied chest pain, cough, fever or chills. He was being treated for cellulitis of the groin with doxycycline for the previous 2 days. He has had 2 sessions of chemo that were both not completed due to the infection in his groin. He has been very weak and easily fatigued. He denies chest pain.  BNP was 680, CXR showed CHF. Troponins were mildly elevated in flat trend- 0.08 X 3. The patient was initially thought to have volume overload and was started on lasix. Lower extremity edema improved.   [name] was last seen in our office By Dr. Aundra Dubin on 04/07/17 at [place] time he was noted to have anasarca, nephrotic syndrome, wt loss, pleural effusions and hypoalbuminemia. The pt had been admitted 02/21/17 with respiratory failure and volume overload. Hospital course  complicated by atrial fibrillation, acute on chronic diastolic CHF, nephrotic syndrome, pleural effusion, orthostatic hypotension requiring midodrine, and diagnosis of AL amyloidosis. Hematology consulted.  He was cardioverted to NSR during this admission but atrial fibrillation had recurred at hospital followup.   On 03/14/2017 he had bone marrow biopsy suggestive of Waldenstrom's macroglobulinemia. He has been started on chemotherapy with bendamustine and rituximab.    Past Medical History:  Diagnosis Date  . Anxiety   . Arthritis    "neck" (08/15/2015)  . Chronic bronchitis ([place])   . Chronic diastolic CHF (congestive heart failure) ([place])   . Constipation   . COPD (chronic obstructive pulmonary disease) ([place])   . DDD (degenerative disc disease), cervical   . Depression   . Dilated aortic root ([place])    16m on echo 05/2016  . Edema of left lower extremity   . Facial basal cell cancer   . H/O acne vulgaris 1960s   [facility]  . Headache    history of - left temporal- years ago- not current (08/15/2015)  . Persistent atrial fibrillation (HCC)    on coumadin with CHADS2VASC score of 2  . Pneumonia 1960s; 2015 X 2  . Prostate cancer ([facility] dx'd early 2000s   "low spreading; non aggressive type" (08/15/2015)  . Pulmonary HTN ([place]    PASP 464mg by echo 05/2016  . Skin cancer    "back"    Past Surgical History:  Procedure Laterality Date  . CARDIOVERSION N/A 10/11/2016  Procedure: CARDIOVERSION;  Surgeon: Sanda Klein, MD;  Location: [place] ENDOSCOPY;  Service: Cardiovascular;  Laterality: N/A;  . CARDIOVERSION N/A 02/26/2017   Procedure: CARDIOVERSION;  Surgeon: [name], MD;  Location: [facility] ENDOSCOPY;  Service: Cardiovascular;  Laterality: N/A;  . COLONOSCOPY    . EXCISIONAL HEMORRHOIDECTOMY  1960s  . INGUINAL HERNIA REPAIR Right 2002  . INGUINAL HERNIA REPAIR Bilateral 08/15/2015  . INGUINAL HERNIA REPAIR Bilateral 08/15/2015   Procedure:  OPEN REPAIR RECURRENT RIGHT INGUINAL HERNIA WITH MESH AND REPAIR LEFT INGUINAL HERNIA WITH MESH;  Surgeon: [name], MD;  [facility];  Service: General;  Laterality: Bilateral;  . INSERTION OF MESH Bilateral 08/15/2015   Procedure: INSERTION OF MESH;  Surgeon: [name], MD;  Location: [place];  Service: General;  Laterality: Bilateral;  . IR THORACENTESIS ASP PLEURAL SPACE W/IMG GUIDE  11/18/2016  . LYMPH NODE BIOPSY Bilateral 10/22/2016   Procedure: LEFT NECK LYMPH NODE BIOPSY;  Surgeon: [name], MD;  Location: [place];  Service: Thoracic;  Laterality: Bilateral;  . PROSTATE BIOPSY    . TONSILLECTOMY  1950s  . VIDEO ASSISTED THORACOSCOPY (VATS)/EMPYEMA Right 05/20/2016   Procedure: VIDEO ASSISTED THORACOSCOPY (VATS) with drainage of pleural effusion;  Surgeon: [name], MD;  Location: [place];  Service: Thoracic;  Laterality: Right;  [name] VIDEO BRONCHOSCOPY WITH ENDOBRONCHIAL ULTRASOUND Right 05/20/2016   Procedure: VIDEO BRONCHOSCOPY WITH ENDOBRONCHIAL ULTRASOUND;  Surgeon: [name], MD;  Location: [facility] OR;  Service: Thoracic;  Laterality: Right;       Inpatient Medications: Scheduled Meds: . acyclovir  400 mg Oral Daily  . docusate sodium  100-300 mg Oral Daily  . feeding supplement  1 Container Oral TID BM  . midodrine  10 mg Oral TID WC  . mirtazapine  7.5 mg Oral QHS  . rivaroxaban  20 mg Oral Q supper  . senna-docusate  2 tablet Oral BID  . sodium chloride flush  3 mL Intravenous Q12H   Continuous Infusions: . sodium chloride    . albumin human     PRN Meds: sodium chloride, albuterol, magnesium hydroxide, morphine, MUSCLE RUB, ondansetron (ZOFRAN) IV, oxyCODONE-acetaminophen, polyethylene glycol, sodium chloride flush  Allergies:    Allergies  Allergen Reactions  . Demerol [Meperidine] Other (See Comments)    UNSPECIFIED REACTION  Causes system to shutdown. ?   . Oxycodone Hcl Other (See Comments)    Pt states this medication 'wires him up' and  makes pt hyper; pt does not want to take this ever again    Social History:   Social History   Socioeconomic History  . Marital status: Married    Spouse name: Not on file  . Number of children: Not on file  . Years of education: Not on file  . Highest education level: Not on file  Social Needs  . Financial resource strain: Not on file  . Food insecurity - worry: Not on file  . Food insecurity - inability: Not on file  . Transportation needs - medical: Not on file  . Transportation needs - non-medical: Not on file  Occupational History  . Not on file  Tobacco Use  . Smoking status: Former Smoker    Packs/day: 1.00    Years: 62.00    Pack years: 62.00    Types: Cigarettes    Last attempt to quit: 05/14/2016    Years since quitting: 0.9  . Smokeless tobacco: Never Used  Substance and Sexual Activity  . Alcohol use: No  Alcohol/week: 0.0 oz  . Drug use: No    Comment: CBD OIL  . Sexual activity: Not Currently    Partners: Female  Other Topics Concern  . Not on file  Social History Narrative  . Not on file    Family History:    Family History  Problem Relation Age of Onset  . Colon cancer Mother   . Ovarian cancer Mother   . Alcoholism Father   . CAD Father   . Alcohol abuse Father   . Ovarian cancer Sister   . Diabetes Neg Hx   . Stomach cancer Neg Hx      ROS:  Please see the history of present illness.  ROS  All other ROS reviewed and negative.     Physical Exam/Data:   Vitals:   05/04/17 0518 05/04/17 1523 05/04/17 2152 05/05/17 0537  BP:  (!) 90/36 (!) 83/46 (!) 84/58  Pulse:  65 61 70  Resp: _0 Temp:  98.6 F (37 C) 98.5 F (36.9 C) 98 F (36.7 C)  TempSrc:  Oral Oral Oral  SpO2: 100% 100% 98% 100%  Weight:    158 lb 11.7 oz (72 kg)  Height:        Intake/Output Summary (Last 24 hours) at 05/05/2017 1225 Last data filed at 05/05/2017 1107 Gross per 24 hour  Intake 340 ml  Output 301 ml  Net 39 ml   Filed Weights    05/03/17 0445 05/04/17 0500 05/05/17 0537  Weight: 159 lb (72.1 kg) 157 lb 3 oz (71.3 kg) 158 lb 11.7 oz (72 kg)   Body mass index is 22.14 kg/m.  General:  Thin, frail, elderly male, in no acute distress HEENT: normal Neck: no JVD Vascular: No carotid bruits; FA pulses 2+ bilaterally without bruits  Cardiac:  normal S1, S2; irregularly irregular rhythm; no murmur  Lungs: Expiratory wheezes Abd: soft, nontender, no hepatomegaly  Ext: Soft non-pitting edema of extremities X 4 Musculoskeletal:  No deformities, generalized weakness Skin: warm and dry  Neuro:  CNs 2-12 intact, no focal abnormalities noted Psych:  Normal affect   EKG:  The EKG was personally reviewed and demonstrates:  Atrial fibrillation with premature ventricular or aberrantly conducted complexes, 67 bpm, RBBB, No changes from previous Telemetry:  Telemetry was personally reviewed and demonstrates:  afib in the 60's  Relevant CV Studies:  Echocardiogram 05/01/2017 Study Conclusions  - Left ventricle: The cavity size was normal. There was moderate   focal basal hypertrophy of the septum. Systolic function was   normal. The estimated ejection fraction was in the range of 55%   to 60%. Wall motion was normal; there were no regional wall   motion abnormalities. - Aortic valve: Transvalvular velocity was within the normal range.   There was no stenosis. There was no regurgitation. - Mitral valve: Mildly calcified annulus. Transvalvular velocity   was within the normal range. There was no evidence for stenosis.   There was trivial regurgitation. - Left atrium: The atrium was severely dilated. - Right ventricle: The cavity size was normal. Wall thickness was   normal. Systolic function was normal. - Atrial septum: There appears to be a patent foramen ovale with   left to right flow at rest. - Tricuspid valve: There was mild regurgitation. - Pulmonary arteries: Systolic pressure was within the normal   range. PA peak  pressure: 27 mm Hg (S).  Laboratory Data:  Chemistry Recent Labs  Lab 05/03/17 0510 05/04/17 0813 05/05/17  0413  NA 135 135 136  K 3.7 3.8 4.1  CL 99* 100* 100*  CO2 _0 GLUCOSE 80 85 81  BUN 29* 27* 27*  CREATININE 1.11 1.08 1.08  CALCIUM 8.2* 8.0* 8.2*  GFRNONAA >60 >60 >60  GFRAA >60 >60 >60  ANIONGAP _1 Recent Labs  Lab 05/04/17 0813 05/05/17 0413  PROT 4.3*  --   ALBUMIN 1.5* 1.5*  AST 22  --   ALT 12*  --   ALKPHOS 176*  --   BILITOT 0.6  --    Hematology Recent Labs  Lab 05/02/17 0413 05/04/17 0813 05/05/17 0413  WBC 3.3* 3.5* 3.3*  RBC 3.45* 3.68* 3.85*  HGB 10.7* 11.5* 12.2*  HCT 32.0* 35.0* 36.5*  MCV 92.8 95.1 94.8  MCH 31.0 31.3 31.7  MCHC 33.4 32.9 33.4  RDW 16.9* 17.1* 17.3*  PLT 307 298 285   Cardiac Enzymes Recent Labs  Lab 04/30/17 1806 04/30/17 2340 05/01/17 0515  TROPONINI 0.08* 0.08* 0.08*   No results for input(s): TROPIPOC in the last 168 hours.  BNP Recent Labs  Lab 04/30/17 1327  BNP 680.0*    DDimer No results for input(s): DDIMER in the last 168 hours.  Radiology/Studies:  No results found.  Assessment and Plan:   Chronic diastolic heart failure -Echo in 02/2017 showed moderate LVH, EF 60-65%, moderately dilated RV with normal systolic function, mild MR, Moderate TR. PA peak pressure 51 mmHg. Echo 05/01/2017 showed EF 55-60%, normal wall motion, trivial MR, severely dilated LA, normal RV systolic function, mild TR. PA peak pressure 27 mmHg -Cardiac amyloidosis is suspected. 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  -Pt received 1 dose of IV lasix and one oral dose. Then discontinued due to hypotension. Lower extremity edema improved.  -Admission wt was 164 lbs, today is 158 lbs. Wt in office on 04/07/17 was 162. -Pt does not appear fluid overloaded.   Orthostatic hypotension with syncope -Pt had 2 episodes of syncope in the day prior to admission when rising from resting position.   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  -Just got up this am to [facility] with staff assit X2 without syncope.   Atrial fibrillation, persistent -Persistent. He was cardioverted in 02/2017, but reverted back to afib shortly after.  -CHA2DS2/VAS Stroke Risk Score is 2 (CHF, Age). He is continued on Xarelto 20 mg daily for stroke risk reduction. No unusual bleeding noted per pt but FOBT was positive. Hgb stable. Pt did have anal fissure from straining with constipation per wife.       Hypomagnesemia -Mag level 1.5. Supplementation given.   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  Diagnosed by renal biopsy, [place] red positive. Manifested as nephrotic syndrome, CHF, and autonomic neuropathy   Waldenstrom's macroglobulinemia suggested by bone marrow biopsy 11/18.  - bendamustine/Rituximab chemo planned for 6 months.   For questions or updates, please contact [place] Please consult [email or web address] for contact info under Cardiology/STEMI.   Signed, Daune Perch, NP  05/05/2017 12:25 PM   History and all data above reviewed.  Patient examined.  I agree with the findings as above.  He has multiple chronic problems as above.  His initial complaint was the skin rash.  He does not report acute SOB.  He does not have new chest  pain.  He does have syncope and severe orthostasis.  his biggest complaint is back pain.  The patient exam reveals VPX:[id number]  ,  Lungs: Clear  ,  Abd: Positive bowel sounds, no rebound no guarding, Ext  Moderate edema .  All available labs, radiology testing, previous records reviewed. Agree with documented assessment and plan. ORTHOSTATIC HYPOTENSION:  I asked nursing to get him some tighter compression stockings.  Agree with increased midodrine which should be given at 7 AM, 12 PM and 2 PM.  Also we could consider an  abdominal binder.  CHRONIC DIASTOLIC HF:  No indication for further diuresis.  ATRIAL FIB:  Rate controlled.  Continue current therapy.    Jeneen Rinks Hochrein  2:35 PM  05/05/2017

## 2017-05-05 NOTE — Progress Notes (Signed)
Patient ID: [name], male   DOB: [birth year], 71 y.o.   MRN: [id number]  PROGRESS NOTE    [name]  YSA:[id number] DOB: 1946-02-01 DOA: 04/30/2017 PCP: Biagio Borg, MD   Brief Narrative:  71 year old male with history of COPD, chronic hypoxic respiratory failure on home oxygen, chronic diastolic CHF, prostate cancer, depression, anxiety, skin cancer, lymphoma on chemotherapy, cellulitis and groin area, A. fib on Xarelto presented with syncope. It was initially thought that patient had volume overload and was started on Lasix. Lower extremity edema improved, Lasix was discontinued because of hypotension. Patient is still very deconditioned.   Assessment & Plan:   Principal Problem:   Acute on chronic diastolic (congestive) heart failure (HCC) Active Problems:   Protein-calorie malnutrition, severe (HCC)   Persistent atrial fibrillation (HCC)   Malignant lymphoplasmacytic lymphoma (HCC)   Syncope and collapse   Pressure injury of skin   Malnutrition of moderate degree     1. Syncope- probably from orthostatic hypotension, systolic blood pressure dropped to 35/22, patient collapsed. Serum cortisol level  8.4. TED hose; continue midodrine10 mg by mouth 3 times a day. Echocardiogram showed normal EF 55-60%, no wall motion abnormality. MRI brain showed no acute abnormality.  Ultrasound carotids did not show significant stenosis. check orthostatic vitals.  PT eval. patient was still very orthostatic yesterday when PT evaluated him. 2. chronic diastolic CHF-currently no signs of volume overload.  Lower extremity edema has improved; patient has some upper extremity edema.  Lasix has been discontinued due to hypotension. If blood pressure allows, low dose Lasix can be restarted for his upper extremity edema. His edema is due to anasarca. Echocardiogram showed EF 55-60%. Consult cardiology for the same and also persistent orthostatic hypotension and syncope. 3. Elevated troponin-  patient had mild elevation of troponin 0.08, likely from demand ischemia from significant hypotension. Patient denies chest pain.   4. Anasarca- from severe protein calorie malnutrition and hypoalbuminemia.  Follow nutrition recommendations  5. Recent diagnosis of AL amyloidosis and nephrotic syndrome- outpatient follow-up with nephrology 6. atrial fibrillation- CHA2DS2-VASc Scoreis 2, Continue Xarelto.  Rate controlled 7. 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 8. Cellulitis  in groin area-  status post treatment with doxycycline 9. COPD- continue prn albuterol. 10. Chronic back pain- improved with Percocet 1-2 tablets every 6 hours when necessary, local muscle rub cream. 11. Constipation-  continue MiraLAX  12. FOBT positive- hemoglobin is stable, patient did have anal fissure from straining due to constipation as per patient's wife. Follow CBC in a.m. 13. Hypomagnesemia- Repeat a.m. Labs 14. Generalized deconditioning- overall condition not improving. If condition worsens, might benefit from palliative care evaluation 15. Hypoalbuminemia- from nephrotic syndrome. Give 3 doses of IV albumin today. Repeat a.m. labs   DVT prophylaxis: Xarelto Code Status: Full Family Communication: Spoke to patient's wife at bedside Disposition Plan: Home versus nursing home in 1-2 days  Consultants: None  Procedures:  Echo on 05/01/2017 Study Conclusions  - Left ventricle: The cavity size was normal. There was moderate   focal basal hypertrophy of the septum. Systolic function was   normal. The estimated ejection fraction was in the range of 55%   to 60%. Wall motion was normal; there were no regional wall   motion abnormalities. - Aortic valve: Transvalvular velocity was within the normal range.   There was no stenosis. There was no regurgitation. - Mitral valve: Mildly calcified annulus. Transvalvular velocity   was within the normal  range. There was no evidence for stenosis.   There was trivial regurgitation. - Left atrium: The atrium was severely dilated. - Right ventricle: The cavity size was normal. Wall thickness was   normal. Systolic function was normal. - Atrial septum: There appears to be a patent foramen ovale with   left to right flow at rest. - Tricuspid valve: There was mild regurgitation. - Pulmonary arteries: Systolic pressure was within the normal   range. PA peak pressure: 27 mm Hg (S).  Bilateral carotid duplex ultrasound on 05/01/2017 Final Interpretation: Right Carotid: There is evidence in the right ICA of a 1-39% stenosis.  Left Carotid: There is evidence in the left ICA of a 1-39% stenosis.  Vertebrals: Both vertebral arteries were patent with antegrade flow.  Antimicrobials: Off Doxycycline   Subjective: Patient seen and examined at bedside.  He feels very weak and tired. he does not feel any better. No overnight fever or vomiting. No chest pains Objective: Vitals:   05/04/17 0518 05/04/17 1523 05/04/17 2152 05/05/17 0537  BP:  (!) 90/36 (!) 83/46 (!) 84/58  Pulse:  65 61 70  Resp: _0 Temp:  98.6 F (37 C) 98.5 F (36.9 C) 98 F (36.7 C)  TempSrc:  Oral Oral Oral  SpO2: 100% 100% 98% 100%  Weight:    72 kg (158 lb 11.7 oz)  Height:        Intake/Output Summary (Last 24 hours) at 05/05/2017 1027 Last data filed at 05/05/2017 0913 Gross per 24 hour  Intake 340 ml  Output 101 ml  Net 239 ml   Filed Weights   05/03/17 0445 05/04/17 0500 05/05/17 0537  Weight: 72.1 kg (159 lb) 71.3 kg (157 lb 3 oz) 72 kg (158 lb 11.7 oz)    Examination:  General exam: Looks older than stated age.  No acute distress  respiratory system: Bilateral decreased breath sounds at bases with scattered crackles  Cardiovascular system: S1 & S2 heard, controlled  gastrointestinal system: Abdomen is nondistended, soft and nontender. Normal bowel sounds heard. Extremities: No cyanosis,  clubbing; no lower extremity edema. 1+ upper extremity edema   Data Reviewed: I have personally reviewed following labs and imaging studies  CBC: Recent Labs  Lab 04/30/17 1327 05/02/17 0413 05/04/17 0813 05/05/17 0413  WBC 3.1* 3.3* 3.5* 3.3*  NEUTROABS  --   --  2.1 2.0  HGB 10.8* 10.7* 11.5* 12.2*  HCT 32.7* 32.0* 35.0* 36.5*  MCV 94.0 92.8 95.1 94.8  PLT 309 307 298 250   Basic Metabolic Panel: Recent Labs  Lab 05/01/17 0515 05/02/17 0413 05/03/17 0510 05/04/17 0813 05/05/17 0413  NA 133* 134* 135 135 136  K 4.3 3.6 3.7 3.8 4.1  CL 97* 98* 99* 100* 100*  CO2 _1 GLUCOSE 96 85 80 85 81  BUN 34* 35* 29* 27* 27*  CREATININE 1.27* 1.15 1.11 1.08 1.08  CALCIUM 8.2* 8.2* 8.2* 8.0* 8.2*  MG  --   --   --  1.5*  --    GFR: Estimated Creatinine Clearance: 62 mL/min (by C-G formula based on SCr of 1.08 mg/dL). Liver Function Tests: Recent Labs  Lab 05/04/17 0813 05/05/17 0413  AST 22  --   ALT 12*  --   ALKPHOS 176*  --   BILITOT 0.6  --   PROT 4.3*  --   ALBUMIN 1.5* 1.5*   No results for input(s): LIPASE, AMYLASE in the last 168 hours. No  results for input(s): AMMONIA in the last 168 hours. Coagulation Profile: No results for input(s): INR, PROTIME in the last 168 hours. Cardiac Enzymes: Recent Labs  Lab 04/30/17 1806 04/30/17 2340 05/01/17 0515  TROPONINI 0.08* 0.08* 0.08*   BNP (last 3 results) No results for input(s): PROBNP in the last 8760 hours. HbA1C: No results for input(s): HGBA1C in the last 72 hours. CBG: Recent Labs  Lab 04/30/17 1318  GLUCAP 79   Lipid Profile: No results for input(s): CHOL, HDL, LDLCALC, TRIG, CHOLHDL, LDLDIRECT in the last 72 hours. Thyroid Function Tests: No results for input(s): TSH, T4TOTAL, FREET4, T3FREE, THYROIDAB in the last 72 hours. Anemia Panel: No results for input(s): VITAMINB12, FOLATE, FERRITIN, TIBC, IRON, RETICCTPCT in the last 72 hours. Sepsis Labs: No results for input(s):  PROCALCITON, LATICACIDVEN in the last 168 hours.  Recent Results (from the past 240 hour(s))  Blood culture (routine x 2)     Status: None   Collection Time: 04/25/17  1:00 PM  Result Value Ref Range Status   Specimen Description BLOOD RIGHT ANTECUBITAL  Final   Special Requests   Final    BOTTLES DRAWN AEROBIC AND ANAEROBIC Blood Culture adequate volume   Culture   Final    NO GROWTH 5 DAYS Performed at [facility], [address], [place], [place] [address]    Report Status 04/30/2017 FINAL  Final  Blood culture (routine x 2)     Status: None   Collection Time: 04/25/17  1:25 PM  Result Value Ref Range Status   Specimen Description BLOOD RIGHT FOREARM  Final   Special Requests   Final    BOTTLES DRAWN AEROBIC AND ANAEROBIC Blood Culture adequate volume   Culture   Final    NO GROWTH 5 DAYS Performed at [facility], [place] [address], [place], [place] [address]    Report Status 04/30/2017 FINAL  Final  Culture, Urine     Status: None   Collection Time: 05/02/17 10:57 AM  Result Value Ref Range Status   Specimen Description URINE, CLEAN CATCH  Final   Special Requests NONE  Final   Culture   Final    NO GROWTH Performed at [facility], [place] [address], [place], [place] [address]    Report Status 05/03/2017 FINAL  Final         Radiology Studies: No results found.      Scheduled Meds: . acyclovir  400 mg Oral Daily  . docusate sodium  100-300 mg Oral Daily  . feeding supplement  1 Container Oral TID BM  . midodrine  10 mg Oral TID WC  . mirtazapine  7.5 mg Oral QHS  . rivaroxaban  20 mg Oral Q supper  . senna-docusate  2 tablet Oral BID  . sodium chloride flush  3 mL Intravenous Q12H   Continuous Infusions: . sodium chloride    . albumin human       LOS: 5 days        Aline August, MD [facility] Pager [phone]  If 7PM-7AM, please contact night-coverage [email or web address] Password [facility] 05/05/2017, 10:27 AM

## 2017-05-06 ENCOUNTER — Inpatient Hospital Stay (HOSPITAL_COMMUNITY): Payer: Medicare Other

## 2017-05-06 LAB — COMPREHENSIVE METABOLIC PANEL
ALBUMIN: 2.2 g/dL — AB (ref 3.5–5.0)
ALK PHOS: 172 U/L — AB (ref 38–126)
ALT: 11 U/L — ABNORMAL LOW (ref 17–63)
ANION GAP: 5 (ref 5–15)
AST: 20 U/L (ref 15–41)
BUN: 25 mg/dL — ABNORMAL HIGH (ref 6–20)
CALCIUM: 8.3 mg/dL — AB (ref 8.9–10.3)
CHLORIDE: 101 mmol/L (ref 101–111)
CO2: 31 mmol/L (ref 22–32)
Creatinine, Ser: 1.04 mg/dL (ref 0.61–1.24)
GFR calc non Af Amer: >60 mL/min (ref >60)
GLUCOSE: 87 mg/dL (ref 65–99)
POTASSIUM: 3.6 mmol/L (ref 3.5–5.1)
Sodium: 137 mmol/L (ref 135–145)
Total Bilirubin: 1 mg/dL (ref 0.3–1.2)
Total Protein: 4.6 g/dL — ABNORMAL LOW (ref 6.5–8.1)

## 2017-05-06 LAB — CBC WITH DIFFERENTIAL/PLATELET
BASOS PCT: 2 %
Basophils Absolute: 0.1 10*3/uL (ref 0.0–0.1)
EOS PCT: 10 %
Eosinophils Absolute: 0.3 10*3/uL (ref 0.0–0.7)
HCT: 30 % — ABNORMAL LOW (ref 39.0–52.0)
HEMOGLOBIN: 9.8 g/dL — AB (ref 13.0–17.0)
Lymphocytes Relative: 12 %
Lymphs Abs: 0.4 10*3/uL — ABNORMAL LOW (ref 0.7–4.0)
MCH: 31.2 pg (ref 26.0–34.0)
MCHC: 32.7 g/dL (ref 30.0–36.0)
MCV: 95.5 fL (ref 78.0–100.0)
MONOS PCT: 13 %
Monocytes Absolute: 0.4 10*3/uL (ref 0.1–1.0)
NEUTROS PCT: 63 %
Neutro Abs: 1.9 10*3/uL (ref 1.7–7.7)
PLATELETS: 251 10*3/uL (ref 150–400)
RBC: 3.14 MIL/uL — ABNORMAL LOW (ref 4.22–5.81)
RDW: 17.5 % — ABNORMAL HIGH (ref 11.5–15.5)
WBC: 3 10*3/uL — AB (ref 4.0–10.5)

## 2017-05-06 LAB — MAGNESIUM: Magnesium: 1.9 mg/dL (ref 1.7–2.4)

## 2017-05-06 MED ORDER — FLUDROCORTISONE ACETATE 0.1 MG PO TABS
0.1000 mg | ORAL_TABLET | Freq: Every day | ORAL | Status: DC
  Administered 2017-05-06 – 2017-05-07 (×2): 0.1 mg via ORAL
  Filled 2017-05-06 (×3): qty 1

## 2017-05-06 MED ORDER — ALBUMIN HUMAN 25 % IV SOLN
25.0000 g | Freq: Three times a day (TID) | INTRAVENOUS | Status: AC
  Administered 2017-05-06 (×3): 25 g via INTRAVENOUS
  Filled 2017-05-06 (×3): qty 100

## 2017-05-06 NOTE — Progress Notes (Signed)
Notified MD Baltazar Najjar of 16 beat run of vtach, pt asymptomatic. Will continue to monitor.  Rosine Beat, RN

## 2017-05-06 NOTE — Progress Notes (Signed)
Progress Note  Patient Name: [name] Date of Encounter: 05/06/2017  Primary Cardiologist:   No primary care provider on file.   Subjective   Doing OK.  No acute SOB overnight.    Inpatient Medications    Scheduled Meds: . acyclovir  400 mg Oral Daily  . docusate sodium  100-300 mg Oral Daily  . feeding supplement  1 Container Oral TID BM  . midodrine  10 mg Oral TID WC  . mirtazapine  7.5 mg Oral QHS  . rivaroxaban  20 mg Oral Q supper  . senna-docusate  2 tablet Oral BID  . sodium chloride flush  3 mL Intravenous Q12H   Continuous Infusions: . sodium chloride Stopped (05/05/17 0913)   PRN Meds: sodium chloride, albuterol, magnesium hydroxide, morphine, MUSCLE RUB, ondansetron (ZOFRAN) IV, oxyCODONE-acetaminophen, polyethylene glycol, sodium chloride flush   Vital Signs    Vitals:   05/05/17 2130 05/06/17 0200 05/06/17 0500 05/06/17 0611  BP: (!) 101/55 (!) 90/56  91/61  Pulse: 64 83  66  Resp: 18   18  Temp: 98.4 F (36.9 C)   97.7 F (36.5 C)  TempSrc: Oral   Oral  SpO2: 98% 95%  98%  Weight:   162 lb 0.6 oz (73.5 kg)   Height:        Intake/Output Summary (Last 24 hours) at 05/06/2017 0709 Last data filed at 05/05/2017 1700 Gross per 24 hour  Intake 780 ml  Output 600 ml  Net 180 ml   Filed Weights   05/04/17 0500 05/05/17 0537 05/06/17 0500  Weight: 157 lb 3 oz (71.3 kg) 158 lb 11.7 oz (72 kg) 162 lb 0.6 oz (73.5 kg)    Telemetry    Atrial fib with controlled rate - Personally Reviewed  ECG     - Personally Reviewed  Physical Exam   GEN: No acute distress.   Neck: No  JVD Cardiac: Irregular RR,  murmurs, rubs, or gallops.  Respiratory: Clear  to auscultation bilaterally. GI: Soft, nontender, non-distended  MS: No edema; No deformity. Neuro:  Nonfocal  Psych: Normal affect   Labs    Chemistry Recent Labs  Lab 05/04/17 0813 05/05/17 0413 05/06/17 0430  NA 135 136 137  K 3.8 4.1 3.6  CL 100* 100* 101  CO2 _0 GLUCOSE 85 81 87  BUN 27* 27* 25*  CREATININE 1.08 1.08 1.04  CALCIUM 8.0* 8.2* 8.3*  PROT 4.3*  --  4.6*  ALBUMIN 1.5* 1.5* 2.2*  AST 22  --  20  ALT 12*  --  11*  ALKPHOS 176*  --  172*  BILITOT 0.6  --  1.0  GFRNONAA >60 >60 >60  GFRAA >60 >60 >60  ANIONGAP _1 Hematology Recent Labs  Lab 05/04/17 0813 05/05/17 0413 05/06/17 0430  WBC 3.5* 3.3* 3.0*  RBC 3.68* 3.85* 3.14*  HGB 11.5* 12.2* 9.8*  HCT 35.0* 36.5* 30.0*  MCV 95.1 94.8 95.5  MCH 31.3 31.7 31.2  MCHC 32.9 33.4 32.7  RDW 17.1* 17.3* 17.5*  PLT 298 285 251    Cardiac Enzymes Recent Labs  Lab 04/30/17 1806 04/30/17 2340 05/01/17 0515  TROPONINI 0.08* 0.08* 0.08*   No results for input(s): TROPIPOC in the last 168 hours.   BNP Recent Labs  Lab 04/30/17 1327  BNP 680.0*     DDimer No results for input(s): DDIMER in the last 168 hours.   Radiology  No results found.  Cardiac Studies   Echo    Study Conclusions  - Left ventricle: The cavity size was normal. There was moderate   focal basal hypertrophy of the septum. Systolic function was   normal. The estimated ejection fraction was in the range of 55%   to 60%. Wall motion was normal; there were no regional wall   motion abnormalities. - Aortic valve: Transvalvular velocity was within the normal range.   There was no stenosis. There was no regurgitation. - Mitral valve: Mildly calcified annulus. Transvalvular velocity   was within the normal range. There was no evidence for stenosis.   There was trivial regurgitation. - Left atrium: The atrium was severely dilated. - Right ventricle: The cavity size was normal. Wall thickness was   normal. Systolic function was normal. - Atrial septum: There appears to be a patent foramen ovale with   left to right flow at rest. - Tricuspid valve: There was mild regurgitation. - Pulmonary arteries: Systolic pressure was within the normal   range. PA peak pressure: 27 mm Hg  (S).    Patient Profile     71 y.o. male with a hx of COPD, chronic hypoxic respiratory failure on home oxygen, chronic diastolic CHF, prostate cancer, depression, anxiety, skin cancer, lymphoma on chemotherapy, cellulitis of groin area, atrial fib on Xarelto who is being seen today for the evaluation of CHF and persistent orthostatic hypotension and syncope at the request of Dr. Starla Link.   Assessment & Plan    CHRONIC DIASTOLIC HF:    Seems to be euvolemic.  Continue current therapy.    ORTHOSTATIC HYPOTENSION:  No change in therapy.  Ambulate today.  If he has significant orthostatic BP changes he will need an abdominal binder.    We will see as needed.    For questions or updates, please contact [facility] Please consult [email or web address] for contact info under Cardiology/STEMI.   Signed, Minus Breeding, MD  05/06/2017, 7:09 AM

## 2017-05-06 NOTE — Progress Notes (Signed)
Patient ID: LYMON KIDNEY, male   DOB: [birth year], 71 y.o.   MRN: [id number]  PROGRESS NOTE    OLIVIER FRAYRE  GBT:[id number] DOB: 12-04-1945 DOA: 04/30/2017 PCP: Biagio Borg, MD   Brief Narrative:  71 year old male with history of COPD, chronic hypoxic respiratory failure on home oxygen, chronic diastolic CHF, prostate cancer, depression, anxiety, skin cancer, lymphoma on chemotherapy, cellulitis and groin area, A. fib on Xarelto presented with syncope. It was initially thought that patient had volume overload and was started on Lasix. Lower extremity edema improved, Lasix was discontinued because of hypotension. Patient is still very deconditioned. Cardiology was consulted for CHF and syncope.   Assessment & Plan:   Principal Problem:   Acute on chronic diastolic (congestive) heart failure (HCC) Active Problems:   Protein-calorie malnutrition, severe (HCC)   Persistent atrial fibrillation (HCC)   Malignant lymphoplasmacytic lymphoma (HCC)   Syncope and collapse   Pressure injury of skin   Malnutrition of moderate degree     1. Syncope- probably from orthostatic hypotension, systolic blood pressure dropped to 35/22, patient collapsed. Serum cortisol level  8.4. TED hose; continue midodrine10 mg by mouth 3 times a day. Echocardiogram showed normal EF 55-60%, no wall motion abnormality. MRI brain showed no acute abnormality.  Ultrasound carotids did not show significant stenosis. check orthostatic vitals. Add Fludrocortisone 0.1 mg daily 2. chronic diastolic CHF-currently no signs of volume overload.  Lower extremity edema has improved; patient has some upper extremity edema.  Lasix has been discontinued due to hypotension. If blood pressure allows, low dose Lasix can be restarted for his upper extremity edema. His edema is due to anasarca. Echocardiogram showed EF 55-60%. Cardiology following. 3. Elevated troponin- patient had mild elevation of troponin 0.08, likely from demand  ischemia from significant hypotension. Patient denies chest pain.   4. Anasarca- from severe protein calorie malnutrition and hypoalbuminemia.  Follow nutrition recommendations  5. Recent diagnosis of AL amyloidosis and nephrotic syndrome- outpatient follow-up with nephrology 6. atrial fibrillation- CHA2DS2-VASc Scoreis 2, Continue Xarelto.  Rate controlled 7. Malignant lymphoplasmacytic lymphoma- patient on chemotherapy; followed by oncology as outpatient. 8. Cellulitis  in groin area-  status post treatment with doxycycline 9. COPD- continue prn albuterol. 10. Chronic back pain-continue pain management 11. Constipation-  continue MiraLAX  12. FOBT positive-hemoglobin stable 13. Hypomagnesemia- replaced. Improved 14. Generalized deconditioning- overall condition not improving. If condition worsens, might benefit from palliative care evaluation 15. Hypoalbuminemia- from nephrotic syndrome. Give 3 more doses of IV albumin today. Repeat a.m. labs   DVT prophylaxis: Xarelto Code Status: Full Family Communication: None at bedside  Disposition Plan: Home versus nursing home in 1-3 days  Consultants: Cardiology  Procedures:  Echo on 05/01/2017 Study Conclusions  - Left ventricle: The cavity size was normal. There was moderate   focal basal hypertrophy of the septum. Systolic function was   normal. The estimated ejection fraction was in the range of 55%   to 60%. Wall motion was normal; there were no regional wall   motion abnormalities. - Aortic valve: Transvalvular velocity was within the normal range.   There was no stenosis. There was no regurgitation. - Mitral valve: Mildly calcified annulus. Transvalvular velocity   was within the normal range. There was no evidence for stenosis.   There was trivial regurgitation. - Left atrium: The atrium was severely dilated. - Right ventricle: The cavity size was normal. Wall thickness was   normal. Systolic function was normal. - Atrial  septum: There appears to  be a patent foramen ovale with   left to right flow at rest. - Tricuspid valve: There was mild regurgitation. - Pulmonary arteries: Systolic pressure was within the normal   range. PA peak pressure: 27 mm Hg (S).  Bilateral carotid duplex ultrasound on 05/01/2017 Final Interpretation: Right Carotid: There is evidence in the right ICA of a 1-39% stenosis.  Left Carotid: There is evidence in the left ICA of a 1-39% stenosis.  Vertebrals: Both vertebral arteries were patent with antegrade flow.  Antimicrobials: Off Doxycycline   Subjective: Patient seen and examined at bedside.  He feels slightly better than yesterday but still feels very weak and tired. No overnight fever or vomiting. No chest pains Objective: Vitals:   05/05/17 2130 05/06/17 0200 05/06/17 0500 05/06/17 0611  BP: (!) 101/55 (!) 90/56  91/61  Pulse: 64 83  66  Resp: 18   18  Temp: 98.4 F (36.9 C)   97.7 F (36.5 C)  TempSrc: Oral   Oral  SpO2: 98% 95%  98%  Weight:   73.5 kg (162 lb 0.6 oz)   Height:        Intake/Output Summary (Last 24 hours) at 05/06/2017 2446 Last data filed at 05/05/2017 1700 Gross per 24 hour  Intake 560 ml  Output 600 ml  Net -40 ml   Filed Weights   05/04/17 0500 05/05/17 0537 05/06/17 0500  Weight: 71.3 kg (157 lb 3 oz) 72 kg (158 lb 11.7 oz) 73.5 kg (162 lb 0.6 oz)    Examination:  General exam: Looks older than stated age.  No acute distress  respiratory system: Bilateral decreased breath sounds at bases with scattered crackles. Not tachypneic Cardiovascular system: S1 & S2 heard, controlled  gastrointestinal system: Abdomen is nondistended, soft and nontender. Normal bowel sounds heard. Extremities: No cyanosis, clubbing; no lower extremity edema. 1+ upper extremity edema   Data Reviewed: I have personally reviewed following labs and imaging studies  CBC: Recent Labs  Lab 04/30/17 1327 05/02/17 0413 05/04/17 0813 05/05/17 0413  05/06/17 0430  WBC 3.1* 3.3* 3.5* 3.3* 3.0*  NEUTROABS  --   --  2.1 2.0 1.9  HGB 10.8* 10.7* 11.5* 12.2* 9.8*  HCT 32.7* 32.0* 35.0* 36.5* 30.0*  MCV 94.0 92.8 95.1 94.8 95.5  PLT 309 307 298 285 950   Basic Metabolic Panel: Recent Labs  Lab 05/02/17 0413 05/03/17 0510 05/04/17 0813 05/05/17 0413 05/06/17 0430  NA 134* 135 135 136 137  K 3.6 3.7 3.8 4.1 3.6  CL 98* 99* 100* 100* 101  CO2 _0 GLUCOSE 85 80 85 81 87  BUN 35* 29* 27* 27* 25*  CREATININE 1.15 1.11 1.08 1.08 1.04  CALCIUM 8.2* 8.2* 8.0* 8.2* 8.3*  MG  --   --  1.5*  --  1.9   GFR: Estimated Creatinine Clearance: 65.8 mL/min (by C-G formula based on SCr of 1.04 mg/dL). Liver Function Tests: Recent Labs  Lab 05/04/17 0813 05/05/17 0413 05/06/17 0430  AST 22  --  20  ALT 12*  --  11*  ALKPHOS 176*  --  172*  BILITOT 0.6  --  1.0  PROT 4.3*  --  4.6*  ALBUMIN 1.5* 1.5* 2.2*   No results for input(s): LIPASE, AMYLASE in the last 168 hours. No results for input(s): AMMONIA in the last 168 hours. Coagulation Profile: No results for input(s): INR, PROTIME in the last 168 hours. Cardiac Enzymes: Recent Labs  Lab 04/30/17 1806 04/30/17  2340 05/01/17 0515  TROPONINI 0.08* 0.08* 0.08*   BNP (last 3 results) No results for input(s): PROBNP in the last 8760 hours. HbA1C: No results for input(s): HGBA1C in the last 72 hours. CBG: Recent Labs  Lab 04/30/17 1318  GLUCAP 79   Lipid Profile: No results for input(s): CHOL, HDL, LDLCALC, TRIG, CHOLHDL, LDLDIRECT in the last 72 hours. Thyroid Function Tests: No results for input(s): TSH, T4TOTAL, FREET4, T3FREE, THYROIDAB in the last 72 hours. Anemia Panel: No results for input(s): VITAMINB12, FOLATE, FERRITIN, TIBC, IRON, RETICCTPCT in the last 72 hours. Sepsis Labs: No results for input(s): PROCALCITON, LATICACIDVEN in the last 168 hours.  Recent Results (from the past 240 hour(s))  Culture, Urine     Status: None   Collection Time:  05/02/17 10:57 AM  Result Value Ref Range Status   Specimen Description URINE, CLEAN CATCH  Final   Special Requests NONE  Final   Culture   Final    NO GROWTH Performed at [facility], [address], [place], [place] [address]    Report Status 05/03/2017 FINAL  Final         Radiology Studies: Dg Chest Port 1 View  Result Date: 05/06/2017 CLINICAL DATA:  Shortness of Breath EXAM: PORTABLE CHEST 1 VIEW COMPARISON:  04/30/2017 FINDINGS: Severe diffuse bilateral airspace disease, worsening since prior study. Moderate to large right effusion appears loculated and increased since prior study. Cardiomegaly, stable. IMPRESSION: Severe diffuse bilateral airspace disease, worsening since prior study, edema or infection. Increasing loculated right pleural effusion. Electronically Signed   By: Rolm Baptise M.D.   On: 05/06/2017 07:14        Scheduled Meds: . acyclovir  400 mg Oral Daily  . docusate sodium  100-300 mg Oral Daily  . feeding supplement  1 Container Oral TID BM  . midodrine  10 mg Oral TID WC  . mirtazapine  7.5 mg Oral QHS  . rivaroxaban  20 mg Oral Q supper  . senna-docusate  2 tablet Oral BID  . sodium chloride flush  3 mL Intravenous Q12H   Continuous Infusions: . sodium chloride Stopped (05/05/17 0913)     LOS: 6 days        Aline August, MD [facility] Pager [phone]  If 7PM-7AM, please contact night-coverage [email or web address] Password [email or web address] 05/06/2017, 9:25 AM

## 2017-05-07 LAB — CBC WITH DIFFERENTIAL/PLATELET
Basophils Absolute: 0 10*3/uL (ref 0.0–0.1)
Basophils Relative: 2 %
Eosinophils Absolute: 0.2 10*3/uL (ref 0.0–0.7)
Eosinophils Relative: 9 %
HEMATOCRIT: 28.4 % — AB (ref 39.0–52.0)
HEMOGLOBIN: 9.3 g/dL — AB (ref 13.0–17.0)
LYMPHS ABS: 0.3 10*3/uL — AB (ref 0.7–4.0)
LYMPHS PCT: 14 %
MCH: 31.6 pg (ref 26.0–34.0)
MCHC: 32.7 g/dL (ref 30.0–36.0)
MCV: 96.6 fL (ref 78.0–100.0)
MONOS PCT: 14 %
Monocytes Absolute: 0.3 10*3/uL (ref 0.1–1.0)
NEUTROS ABS: 1.5 10*3/uL — AB (ref 1.7–7.7)
NEUTROS PCT: 61 %
Platelets: 230 10*3/uL (ref 150–400)
RBC: 2.94 MIL/uL — AB (ref 4.22–5.81)
RDW: 17.6 % — ABNORMAL HIGH (ref 11.5–15.5)
WBC: 2.4 10*3/uL — AB (ref 4.0–10.5)

## 2017-05-07 LAB — BASIC METABOLIC PANEL
Anion gap: 4 — ABNORMAL LOW (ref 5–15)
BUN: 24 mg/dL — AB (ref 6–20)
CHLORIDE: 103 mmol/L (ref 101–111)
CO2: 31 mmol/L (ref 22–32)
CREATININE: 0.91 mg/dL (ref 0.61–1.24)
Calcium: 8.4 mg/dL — ABNORMAL LOW (ref 8.9–10.3)
GFR calc Af Amer: >60 mL/min (ref >60)
GFR calc non Af Amer: >60 mL/min (ref >60)
Glucose, Bld: 85 mg/dL (ref 65–99)
POTASSIUM: 3.7 mmol/L (ref 3.5–5.1)
SODIUM: 138 mmol/L (ref 135–145)

## 2017-05-07 LAB — MAGNESIUM: MAGNESIUM: 1.8 mg/dL (ref 1.7–2.4)

## 2017-05-07 MED ORDER — FUROSEMIDE 10 MG/ML IJ SOLN
20.0000 mg | Freq: Two times a day (BID) | INTRAMUSCULAR | Status: DC
  Administered 2017-05-07 – 2017-05-08 (×2): 20 mg via INTRAVENOUS
  Filled 2017-05-07 (×2): qty 2

## 2017-05-07 NOTE — Progress Notes (Signed)
Physical Therapy Treatment Patient Details Name: [name] MRN: [id number] DOB: 02-05-1946 Today's Date: 05/07/2017    History of Present Illness 71 year old male with history of COPD, chronic hypoxic respiratory failure on home oxygen, chronic diastolic CHF, prostate cancer, depression, anxiety, skin cancer, lymphoma on chemotherapy, cellulitis and groin area, A. fib on Xarelto presented with syncope.    PT Comments    Pt cooperative and c/o fatigue but denies dizziness with mobilization despite dropping BP.  Supine 105/62 HR 49; sit 90/49 HR 65; stand 81/61 HR 103 and stand 3 min 61/37 HR 92.   Follow Up Recommendations  Home health PT;Supervision/Assistance - 24 hour     Equipment Recommendations  Wheelchair (measurements PT);Hospital bed    Recommendations for Other Services       Precautions / Restrictions Precautions Precautions: Fall Precaution Comments: monitor BP Restrictions Weight Bearing Restrictions: No    Mobility  Bed Mobility Overal bed mobility: Needs Assistance Bed Mobility: Sit to Supine;Supine to Sit     Supine to sit: Min assist Sit to supine: Min assist   General bed mobility comments: slow transition from supine > sit due to back pain.  A for LE for sit > supine  Transfers Overall transfer level: Needs assistance Equipment used: Rolling walker (2 wheeled) Transfers: Sit to/from Stand Sit to Stand: Min assist;+2 physical assistance;+2 safety/equipment         General transfer comment: cues for use of UEs to self assist  Ambulation/Gait Ambulation/Gait assistance: Min assist;+2 safety/equipment Ambulation Distance (Feet): 12 Feet Assistive device: Rolling walker (2 wheeled) Gait Pattern/deviations: Step-through pattern;Shuffle;Trunk flexed Gait velocity: decr Gait velocity interpretation: Below normal speed for age/gender General Gait Details: marched in place and ambulated short distance but ltd by dropping BP   Stairs            Wheelchair Mobility    Modified Rankin (Stroke Patients Only)       Balance Overall balance assessment: Needs assistance Sitting-balance support: Feet supported;No upper extremity supported Sitting balance-Leahy Scale: Fair Sitting balance - Comments: sits flexed   Standing balance support: Bilateral upper extremity supported Standing balance-Leahy Scale: Poor                              Cognition Arousal/Alertness: Awake/alert Behavior During Therapy: WFL for tasks assessed/performed Overall Cognitive Status: Within Functional Limits for tasks assessed                                        Exercises General Exercises - Lower Extremity Hip Flexion/Marching: AROM;20 reps;Standing    General Comments        Pertinent Vitals/Pain Pain Assessment: 0-10 Pain Score: 5  Pain Location: chronic back pain Pain Descriptors / Indicators: Constant Pain Intervention(s): Limited activity within patient's tolerance;Monitored during session;Premedicated before session    Home Living                      Prior Function            PT Goals (current goals can now be found in the care plan section) Acute Rehab PT Goals Patient Stated Goal: wife would like to take pt home instead of SNF. PT Goal Formulation: With patient/family Time For Goal Achievement: 05/17/17 Potential to Achieve Goals: Good Progress towards PT goals: Progressing toward goals  Frequency    Min 3X/week      PT Plan Current plan remains appropriate    Co-evaluation              AM-PAC PT "6 Clicks" Daily Activity  Outcome Measure  Difficulty turning over in bed (including adjusting bedclothes, sheets and blankets)?: A Lot Difficulty moving from lying on back to sitting on the side of the bed? : A Lot Difficulty sitting down on and standing up from a chair with arms (e.g., wheelchair, bedside commode, etc,.)?: Unable Help needed moving to and  from a bed to chair (including a wheelchair)?: Total Help needed walking in hospital room?: A Little Help needed climbing 3-5 steps with a railing? : Total 6 Click Score: 10    End of Session Equipment Utilized During Treatment: Gait belt Activity Tolerance: Patient limited by fatigue Patient left: in bed Nurse Communication: Mobility status;Other (comment) PT Visit Diagnosis: Muscle weakness (generalized) (M62.81);History of falling (Z91.81)     Time: 4718-5501 PT Time Calculation (min) (ACUTE ONLY): 28 min  Charges:  $Gait Training: 8-22 mins $Therapeutic Activity: 8-22 mins                    G Codes:       Pg [phone]    Cornell Bourbon 05/07/2017, 4:39 PM

## 2017-05-07 NOTE — Progress Notes (Signed)
Patient ID: [name], male   DOB: 06-Apr-1946, 71 y.o.   MRN: [id number]  PROGRESS NOTE    [name]  UOH:[id number] DOB: 28-Aug-1945 DOA: 04/30/2017 PCP: Biagio Borg, MD   Brief Narrative:  71 year old male with history of COPD, chronic hypoxic respiratory failure on home oxygen, chronic diastolic CHF, prostate cancer, depression, anxiety, skin cancer, lymphoma on chemotherapy, cellulitis and groin area, A. fib on Xarelto presented with syncope. It was initially thought that patient had volume overload and was started on Lasix. Lower extremity edema improved, Lasix was discontinued because of hypotension. Patient is still very deconditioned. Cardiology was consulted for CHF and syncope.   Assessment & Plan:   Principal Problem:   Acute on chronic diastolic (congestive) heart failure (HCC) Active Problems:   Protein-calorie malnutrition, severe (HCC)   Persistent atrial fibrillation (HCC)   Malignant lymphoplasmacytic lymphoma (HCC)   Syncope and collapse   Pressure injury of skin   Malnutrition of moderate degree     1. Syncope- probably from orthostatic hypotension, systolic blood pressure dropped to 35/22, patient collapsed. Serum cortisol level  8.4. TED hose; continue midodrine10 mg by mouth 3 times a day. Echocardiogram showed normal EF 55-60%, no wall motion abnormality. MRI brain showed no acute abnormality.  Ultrasound carotids did not show significant stenosis. check orthostatic vitals. Continue Fludrocortisone 0.1 mg daily 2. chronic diastolic CHF- Lower extremity edema has improved; patient has significant upper extremity edema.  Lasix has been discontinued due to hypotension. If blood pressure allows, low dose Lasix will be tried today. His edema is due to anasarca. Echocardiogram showed EF 55-60%. Cardiology following. Chest x-ray from yesterday shows worsening congestion. 3. Elevated troponin- patient had mild elevation of troponin 0.08, likely from demand  ischemia from significant hypotension. Patient denies chest pain.   4. Anasarca- from severe protein calorie malnutrition and hypoalbuminemia.  Follow nutrition recommendations  5. Recent diagnosis of AL amyloidosis and nephrotic syndrome- outpatient follow-up with nephrology 6. atrial fibrillation- CHA2DS2-VASc Scoreis 2, Continue Xarelto.  Rate controlled 7. Malignant lymphoplasmacytic lymphoma- patient on chemotherapy; followed by oncology as outpatient. 8. Cellulitis  in groin area-  status post treatment with doxycycline 9. COPD- continue prn albuterol. 10. Chronic back pain-continue pain management 11. Constipation-  continue MiraLAX  12. FOBT positive-hemoglobin stable 13. Hypomagnesemia- replaced. Improved 14. Generalized deconditioning- overall condition not improving. If condition worsens, might benefit from palliative care evaluation 15. Hypoalbuminemia- from nephrotic syndrome. Patient received 3 doses of IV albumin again yesterday. We will avoid any more albumin because of worsening upper extremity edema and chest x-ray findings.   DVT prophylaxis: Xarelto Code Status: Full Family Communication: None at bedside  Disposition Plan: Home versus nursing home in 1-3 days  Consultants: Cardiology  Procedures:  Echo on 05/01/2017 Study Conclusions  - Left ventricle: The cavity size was normal. There was moderate   focal basal hypertrophy of the septum. Systolic function was   normal. The estimated ejection fraction was in the range of 55%   to 60%. Wall motion was normal; there were no regional wall   motion abnormalities. - Aortic valve: Transvalvular velocity was within the normal range.   There was no stenosis. There was no regurgitation. - Mitral valve: Mildly calcified annulus. Transvalvular velocity   was within the normal range. There was no evidence for stenosis.   There was trivial regurgitation. - Left atrium: The atrium was severely dilated. - Right ventricle:  The cavity size was normal. Wall thickness was  normal. Systolic function was normal. - Atrial septum: There appears to be a patent foramen ovale with   left to right flow at rest. - Tricuspid valve: There was mild regurgitation. - Pulmonary arteries: Systolic pressure was within the normal   range. PA peak pressure: 27 mm Hg (S).  Bilateral carotid duplex ultrasound on 05/01/2017 Final Interpretation: Right Carotid: There is evidence in the right ICA of a 1-39% stenosis.  Left Carotid: There is evidence in the left ICA of a 1-39% stenosis.  Vertebrals: Both vertebral arteries were patent with antegrade flow.  Antimicrobials: Off Doxycycline   Subjective: Patient seen and examined at bedside.  He feels slightly better than yesterday but still feels very weak and tired. No overnight fever or vomiting. No chest pains. No increased shortness of breath Objective: Vitals:   05/06/17 2017 05/06/17 2223 05/07/17 0551 05/07/17 0630  BP: 95/64 96/66 (!) 81/54   Pulse: 65 (!) 55 64   Resp: _0 Temp: 98 F (36.7 C) 98.4 F (36.9 C) 97.8 F (36.6 C)   TempSrc: Oral Oral Oral   SpO2: 96% 96% 98%   Weight:    73.1 kg (161 lb 2.5 oz)  Height:        Intake/Output Summary (Last 24 hours) at 05/07/2017 3235 Last data filed at 05/07/2017 5732 Gross per 24 hour  Intake 320 ml  Output 575 ml  Net -255 ml   Filed Weights   05/05/17 0537 05/06/17 0500 05/07/17 0630  Weight: 72 kg (158 lb 11.7 oz) 73.5 kg (162 lb 0.6 oz) 73.1 kg (161 lb 2.5 oz)    Examination:  General exam: Looks older than stated age.  No acute distress  respiratory system: Bilateral decreased breath sounds at bases with scattered crackles Cardiovascular system: S1 & S2 heard, controlled  gastrointestinal system: Abdomen is nondistended, soft and nontender. Normal bowel sounds heard. Extremities: No cyanosis, clubbing; no lower extremity edema. 1-2+ upper extremity edema   Data Reviewed: I have personally  reviewed following labs and imaging studies  CBC: Recent Labs  Lab 05/02/17 0413 05/04/17 0813 05/05/17 0413 05/06/17 0430 05/07/17 0500  WBC 3.3* 3.5* 3.3* 3.0* 2.4*  NEUTROABS  --  2.1 2.0 1.9 1.5*  HGB 10.7* 11.5* 12.2* 9.8* 9.3*  HCT 32.0* 35.0* 36.5* 30.0* 28.4*  MCV 92.8 95.1 94.8 95.5 96.6  PLT 307 298 285 251 202   Basic Metabolic Panel: Recent Labs  Lab 05/03/17 0510 05/04/17 0813 05/05/17 0413 05/06/17 0430 05/07/17 0500  NA 135 135 136 137 138  K 3.7 3.8 4.1 3.6 3.7  CL 99* 100* 100* 101 103  CO2 _1 GLUCOSE 80 85 81 87 85  BUN 29* 27* 27* 25* 24*  CREATININE 1.11 1.08 1.08 1.04 0.91  CALCIUM 8.2* 8.0* 8.2* 8.3* 8.4*  MG  --  1.5*  --  1.9 1.8   GFR: Estimated Creatinine Clearance: 74.8 mL/min (by C-G formula based on SCr of 0.91 mg/dL). Liver Function Tests: Recent Labs  Lab 05/04/17 0813 05/05/17 0413 05/06/17 0430  AST 22  --  20  ALT 12*  --  11*  ALKPHOS 176*  --  172*  BILITOT 0.6  --  1.0  PROT 4.3*  --  4.6*  ALBUMIN 1.5* 1.5* 2.2*   No results for input(s): LIPASE, AMYLASE in the last 168 hours. No results for input(s): AMMONIA in the last 168 hours. Coagulation Profile: No results for input(s): INR, PROTIME in  the last 168 hours. Cardiac Enzymes: Recent Labs  Lab 04/30/17 1806 04/30/17 2340 05/01/17 0515  TROPONINI 0.08* 0.08* 0.08*   BNP (last 3 results) No results for input(s): PROBNP in the last 8760 hours. HbA1C: No results for input(s): HGBA1C in the last 72 hours. CBG: Recent Labs  Lab 04/30/17 1318  GLUCAP 79   Lipid Profile: No results for input(s): CHOL, HDL, LDLCALC, TRIG, CHOLHDL, LDLDIRECT in the last 72 hours. Thyroid Function Tests: No results for input(s): TSH, T4TOTAL, FREET4, T3FREE, THYROIDAB in the last 72 hours. Anemia Panel: No results for input(s): VITAMINB12, FOLATE, FERRITIN, TIBC, IRON, RETICCTPCT in the last 72 hours. Sepsis Labs: No results for input(s): PROCALCITON, LATICACIDVEN  in the last 168 hours.  Recent Results (from the past 240 hour(s))  Culture, Urine     Status: None   Collection Time: 05/02/17 10:57 AM  Result Value Ref Range Status   Specimen Description URINE, CLEAN CATCH  Final   Special Requests NONE  Final   Culture   Final    NO GROWTH Performed at [facility], [address], [place], [place] [address]    Report Status 05/03/2017 FINAL  Final         Radiology Studies: Dg Chest Port 1 View  Result Date: 05/06/2017 CLINICAL DATA:  Shortness of Breath EXAM: PORTABLE CHEST 1 VIEW COMPARISON:  04/30/2017 FINDINGS: Severe diffuse bilateral airspace disease, worsening since prior study. Moderate to large right effusion appears loculated and increased since prior study. Cardiomegaly, stable. IMPRESSION: Severe diffuse bilateral airspace disease, worsening since prior study, edema or infection. Increasing loculated right pleural effusion. Electronically Signed   By: Rolm Baptise M.D.   On: 05/06/2017 07:14        Scheduled Meds: . acyclovir  400 mg Oral Daily  . docusate sodium  100-300 mg Oral Daily  . feeding supplement  1 Container Oral TID BM  . fludrocortisone  0.1 mg Oral Daily  . midodrine  10 mg Oral TID WC  . mirtazapine  7.5 mg Oral QHS  . rivaroxaban  20 mg Oral Q supper  . senna-docusate  2 tablet Oral BID  . sodium chloride flush  3 mL Intravenous Q12H   Continuous Infusions: . sodium chloride Stopped (05/05/17 0913)     LOS: 7 days        Aline August, MD [facility] Pager [phone]  If 7PM-7AM, please contact night-coverage [email or web address] Password [facility] 05/07/2017, 9:39 AM

## 2017-05-07 NOTE — Progress Notes (Signed)
Progress Note  Patient Name: [name] Date of Encounter: 05/07/2017  Primary Cardiologist:   [name], MD   Subjective   He denies any SOB.  He was very orthostatic when his BP was checked although he really did not feel his BP going down to the 60s.    Inpatient Medications    Scheduled Meds: . acyclovir  400 mg Oral Daily  . docusate sodium  100-300 mg Oral Daily  . feeding supplement  1 Container Oral TID BM  . fludrocortisone  0.1 mg Oral Daily  . furosemide  20 mg Intravenous Q12H  . midodrine  10 mg Oral TID WC  . mirtazapine  7.5 mg Oral QHS  . rivaroxaban  20 mg Oral Q supper  . senna-docusate  2 tablet Oral BID  . sodium chloride flush  3 mL Intravenous Q12H   Continuous Infusions: . sodium chloride Stopped (05/05/17 0913)   PRN Meds: sodium chloride, albuterol, magnesium hydroxide, morphine, MUSCLE RUB, ondansetron (ZOFRAN) IV, oxyCODONE-acetaminophen, polyethylene glycol, sodium chloride flush   Vital Signs    Vitals:   05/06/17 2223 05/07/17 0551 05/07/17 0630 05/07/17 1351  BP: 96/66 (!) 81/54  (!) 97/54  Pulse: (!) 55 64  61  Resp: _0 Temp: 98.4 F (36.9 C) 97.8 F (36.6 C)  98.2 F (36.8 C)  TempSrc: Oral Oral  Oral  SpO2: 96% 98%  95%  Weight:   161 lb 2.5 oz (73.1 kg)   Height:        Intake/Output Summary (Last 24 hours) at 05/07/2017 1433 Last data filed at 05/07/2017 0552 Gross per 24 hour  Intake 220 ml  Output 575 ml  Net -355 ml   Filed Weights   05/05/17 0537 05/06/17 0500 05/07/17 0630  Weight: 158 lb 11.7 oz (72 kg) 162 lb 0.6 oz (73.5 kg) 161 lb 2.5 oz (73.1 kg)    Telemetry    Atrial fib with controlled rate - Personally Reviewed  ECG     - Personally Reviewed  Physical Exam   GEN: No acute distress.  Frail Neck: No  JVD Cardiac: Irregular RR, no murmurs, rubs, or gallops.  Respiratory: Clear  to auscultation bilaterally. GI: Soft, nontender, non-distended  MS: Moderate leg edema and upper  extremity edema; No deformity. Neuro:  Nonfocal  Psych: Normal affect   Labs    Chemistry Recent Labs  Lab 05/04/17 0813 05/05/17 0413 05/06/17 0430 05/07/17 0500  NA 135 136 137 138  K 3.8 4.1 3.6 3.7  CL 100* 100* 101 103  CO2 _1 GLUCOSE 85 81 87 85  BUN 27* 27* 25* 24*  CREATININE 1.08 1.08 1.04 0.91  CALCIUM 8.0* 8.2* 8.3* 8.4*  PROT 4.3*  --  4.6*  --   ALBUMIN 1.5* 1.5* 2.2*  --   AST 22  --  20  --   ALT 12*  --  11*  --   ALKPHOS 176*  --  172*  --   BILITOT 0.6  --  1.0  --   GFRNONAA >60 >60 >60 >60  GFRAA >60 >60 >60 >60  ANIONGAP _2 4*     Hematology Recent Labs  Lab 05/05/17 0413 05/06/17 0430 05/07/17 0500  WBC 3.3* 3.0* 2.4*  RBC 3.85* 3.14* 2.94*  HGB 12.2* 9.8* 9.3*  HCT 36.5* 30.0* 28.4*  MCV 94.8 95.5 96.6  MCH 31.7 31.2 31.6  MCHC 33.4 32.7 32.7  RDW  17.3* 17.5* 17.6*  PLT 285 251 230    Cardiac Enzymes Recent Labs  Lab 04/30/17 1806 04/30/17 2340 05/01/17 0515  TROPONINI 0.08* 0.08* 0.08*   No results for input(s): TROPIPOC in the last 168 hours.   BNPNo results for input(s): BNP, PROBNP in the last 168 hours.   DDimer No results for input(s): DDIMER in the last 168 hours.   Radiology    Dg Chest Port 1 View  Result Date: 05/06/2017 CLINICAL DATA:  Shortness of Breath EXAM: PORTABLE CHEST 1 VIEW COMPARISON:  04/30/2017 FINDINGS: Severe diffuse bilateral airspace disease, worsening since prior study. Moderate to large right effusion appears loculated and increased since prior study. Cardiomegaly, stable. IMPRESSION: Severe diffuse bilateral airspace disease, worsening since prior study, edema or infection. Increasing loculated right pleural effusion. Electronically Signed   By: Rolm Baptise M.D.   On: 05/06/2017 07:14    Cardiac Studies   Echo    Study Conclusions  - Left ventricle: The cavity size was normal. There was moderate focal basal hypertrophy of the septum. Systolic function was normal. The  estimated ejection fraction was in the range of 55% to 60%. Wall motion was normal; there were no regional wall motion abnormalities. - Aortic valve: Transvalvular velocity was within the normal range. There was no stenosis. There was no regurgitation. - Mitral valve: Mildly calcified annulus. Transvalvular velocity was within the normal range. There was no evidence for stenosis. There was trivial regurgitation. - Left atrium: The atrium was severely dilated. - Right ventricle: The cavity size was normal. Wall thickness was normal. Systolic function was normal. - Atrial septum: There appears to be a patent foramen ovale with left to right flow at rest. - Tricuspid valve: There was mild regurgitation. - Pulmonary arteries: Systolic pressure was within the normal range. PA peak pressure: 27 mm Hg (S).     Patient Profile     71 y.o. male a hx of COPD, chronic hypoxic respiratory failure on home oxygen, chronic diastolic CHF, prostate cancer, depression,anxiety, skin cancer, lymphoma on chemotherapy, cellulitis of groin area, atrial fib on Xareltowho is being seen today for the evaluation of CHF and persistent orthostatic hypotension and syncopeat the request ofDr. Alekh.    Assessment & Plan    ACUTE ON CHRONIC DIASTOLIC HF:  I would not suggest fludrocortisone for his orthostatic hypotension given the concern for increasing edema.   Rather I would stop this and treat the orthostatic with the knee high or thigh high compression stockings plus an abdominal binder.  I will defer to the primary team who started this yesterday.    ORTHOSTATIC HYPOTENSION:    I will order an abdominal binder.   However, he has very sensitive skin and might not tolerate this.    For questions or updates, please contact [place] Please consult [email or web address] for contact info under Cardiology/STEMI.   Signed, Minus Breeding, MD  05/07/2017, 2:33 PM

## 2017-05-08 ENCOUNTER — Inpatient Hospital Stay (HOSPITAL_COMMUNITY): Payer: Medicare Other

## 2017-05-08 DIAGNOSIS — R601 Generalized edema: Secondary | ICD-10-CM

## 2017-05-08 LAB — CBC WITH DIFFERENTIAL/PLATELET
BASOS ABS: 0.1 10*3/uL (ref 0.0–0.1)
Basophils Relative: 2 %
EOS PCT: 6 %
Eosinophils Absolute: 0.2 10*3/uL (ref 0.0–0.7)
HEMATOCRIT: 32 % — AB (ref 39.0–52.0)
Hemoglobin: 10.3 g/dL — ABNORMAL LOW (ref 13.0–17.0)
LYMPHS ABS: 0.4 10*3/uL — AB (ref 0.7–4.0)
LYMPHS PCT: 14 %
MCH: 30.9 pg (ref 26.0–34.0)
MCHC: 32.2 g/dL (ref 30.0–36.0)
MCV: 96.1 fL (ref 78.0–100.0)
Monocytes Absolute: 0.5 10*3/uL (ref 0.1–1.0)
Monocytes Relative: 18 %
NEUTROS ABS: 1.8 10*3/uL (ref 1.7–7.7)
Neutrophils Relative %: 60 %
PLATELETS: 243 10*3/uL (ref 150–400)
RBC: 3.33 MIL/uL — AB (ref 4.22–5.81)
RDW: 17.7 % — ABNORMAL HIGH (ref 11.5–15.5)
WBC: 3 10*3/uL — AB (ref 4.0–10.5)

## 2017-05-08 LAB — BASIC METABOLIC PANEL
ANION GAP: 4 — AB (ref 5–15)
BUN: 23 mg/dL — AB (ref 6–20)
CHLORIDE: 102 mmol/L (ref 101–111)
CO2: 31 mmol/L (ref 22–32)
Calcium: 8.4 mg/dL — ABNORMAL LOW (ref 8.9–10.3)
Creatinine, Ser: 0.99 mg/dL (ref 0.61–1.24)
GFR calc Af Amer: >60 mL/min (ref >60)
GLUCOSE: 90 mg/dL (ref 65–99)
POTASSIUM: 3.4 mmol/L — AB (ref 3.5–5.1)
Sodium: 137 mmol/L (ref 135–145)

## 2017-05-08 LAB — MAGNESIUM: Magnesium: 1.7 mg/dL (ref 1.7–2.4)

## 2017-05-08 LAB — ALBUMIN: Albumin: 2.5 g/dL — ABNORMAL LOW (ref 3.5–5.0)

## 2017-05-08 MED ORDER — BOOST / RESOURCE BREEZE PO LIQD CUSTOM
1.0000 | ORAL | Status: DC
  Administered 2017-05-09 – 2017-05-12 (×4): 1 via ORAL

## 2017-05-08 MED ORDER — POTASSIUM CHLORIDE CRYS ER 20 MEQ PO TBCR
60.0000 meq | EXTENDED_RELEASE_TABLET | Freq: Once | ORAL | Status: AC
  Administered 2017-05-08: 60 meq via ORAL
  Filled 2017-05-08: qty 3

## 2017-05-08 MED ORDER — MAGNESIUM OXIDE 400 (241.3 MG) MG PO TABS
400.0000 mg | ORAL_TABLET | Freq: Every day | ORAL | Status: DC
  Administered 2017-05-08: 400 mg via ORAL
  Filled 2017-05-08: qty 1

## 2017-05-08 MED ORDER — ENSURE ENLIVE PO LIQD
237.0000 mL | ORAL | Status: DC
  Administered 2017-05-08 – 2017-05-11 (×2): 237 mL via ORAL

## 2017-05-08 NOTE — Progress Notes (Signed)
Nutrition Follow-up  DOCUMENTATION CODES:   Non-severe (moderate) malnutrition in context of chronic illness  INTERVENTION:    Boost Breeze po Q24, each supplement provides 250 kcal and 9 grams of protein  Ensure Enlive po Q24, each supplement provides 350 kcal and 20 grams of protein  NUTRITION DIAGNOSIS:   Moderate Malnutrition related to chronic illness, cancer and cancer related treatments as evidenced by edema, severe muscle depletion, moderate muscle depletion, moderate fat depletion, severe fat depletion.  Ongoing  GOAL:   Patient will meet greater than or equal to 90% of their needs  Not meeting  MONITOR:   PO intake, Weight trends, Labs, Supplement acceptance  REASON FOR ASSESSMENT:   Consult Assessment of nutrition requirement/status  ASSESSMENT:   Pt with PMH significant for COPD, dCHF, prostate cancer, depression, lymphoma on chemotherapy, and cellulitis in groin. Presents this admission with acute on chronic heart failure and anasarca.   Pt reports appetite is getting better slowly. Some food options he can tolerate and other make him nauseous. Pt does not like eating hospital food so wife brings home cooked meals. Pt was able to eat macaroni and cheese last night without any complication. Pt noted to eat 50% of breakfast this morning. Supplement intake is off and on. Pt requests to have one Ensure per day along with Boost. RD to change. Weight noted to be down two pounds since last RD visit. Upper extremities remain edematous.   Medications reviewed and include: colace, mag oxide Labs reviewed: K 3.4 (L)   Diet Order:  Diet Heart Room service appropriate? Yes; Fluid consistency: Thin; Fluid restriction: 1200 mL Fluid  EDUCATION NEEDS:   Not appropriate for education at this time  Skin:  Skin Assessment: Skin Integrity Issues: Skin Integrity Issues:: Stage I Stage I: coccyx  Last BM:  05/07/17  Height:   Ht Readings from Last 1 Encounters:   04/30/17 5' 11" (1.803 m)    Weight:   Wt Readings from Last 1 Encounters:  05/07/17 161 lb 2.5 oz (73.1 kg)    Ideal Body Weight:  78.2 kg  BMI:  Body mass index is 22.48 kg/m.  Estimated Nutritional Needs:   Kcal:  2100-2300 kcal/day  Protein:  105-115 g/day  Fluid:  >2.1 L/day    Mariana Single RD, LDN Clinical Nutrition Pager # - [phone]

## 2017-05-08 NOTE — Progress Notes (Signed)
Patient ID: NHIA HEAPHY, male   DOB: 10-18-1945, 71 y.o.   MRN: [id number]  PROGRESS NOTE    [name]  NLG:[id number] DOB: [birth year] DOA: 04/30/2017 PCP: Biagio Borg, MD   Brief Narrative:  70 year old male with history of COPD, chronic hypoxic respiratory failure on home oxygen, chronic diastolic CHF, prostate cancer, depression, anxiety, skin cancer, lymphoma on chemotherapy, cellulitis and groin area, A. fib on Xarelto presented with syncope. It was initially thought that patient had volume overload and was started on Lasix. Lower extremity edema improved, Lasix was discontinued because of hypotension. Patient is still very deconditioned. Cardiology was consulted for CHF and syncope.   Assessment & Plan:   Principal Problem:   Acute on chronic diastolic (congestive) heart failure (HCC) Active Problems:   Protein-calorie malnutrition, severe (HCC)   Persistent atrial fibrillation (HCC)   Malignant lymphoplasmacytic lymphoma (HCC)   Syncope and collapse   Pressure injury of skin   Malnutrition of moderate degree     1. Syncope- probably from orthostatic hypotension, systolic blood pressure dropped to 35/22, patient collapsed. Serum cortisol level  8.4. TED hose; continue midodrine10 mg by mouth 3 times a day. Echocardiogram showed normal EF 55-60%, no wall motion abnormality. MRI brain showed no acute abnormality.  Ultrasound carotids did not show significant stenosis. check orthostatic vitals. DC fludrocortisone because of edema. 2. chronic diastolic CHF- Lower extremity edema has improved; upper extremity edema has significantly improved since yesterday after IV Lasix. Continue IV Lasix if blood pressure allows. His edema is due to anasarca. Echocardiogram showed EF 55-60%. Cardiology following. Repeat chest x-ray for tomorrow  3. Elevated troponin- patient had mild elevation of troponin 0.08, likely from demand ischemia from significant hypotension. Patient denies chest  pain.   4. Anasarca- from severe protein calorie malnutrition and hypoalbuminemia.  Follow nutrition recommendations  5. Recent diagnosis of AL amyloidosis and nephrotic syndrome- outpatient follow-up with nephrology 6. atrial fibrillation- CHA2DS2-VASc Scoreis 2, Continue Xarelto.  Rate controlled 7. Malignant lymphoplasmacytic lymphoma- patient on chemotherapy; followed by oncology as outpatient. 8. Cellulitis  in groin area-  status post treatment with doxycycline 9. COPD- continue prn albuterol. 10. Chronic back pain-continue pain management 11. Constipation-  continue MiraLAX  12. FOBT positive-hemoglobin stable 13. Hypomagnesemia- replaced. Improved 14. Generalized deconditioning- overall condition not improving. If condition worsens and if cardiology also agreeable, might benefit from palliative care evaluation 15. Hypoalbuminemia- from nephrotic syndrome. Patient received doses of intravenous albumin few days ago. We will avoid any more albumin for now.    DVT prophylaxis: Xarelto Code Status: Full Family Communication: None at bedside  Disposition Plan: Depends on clinical outcome  Consultants: Cardiology  Procedures:  Echo on 05/01/2017 Study Conclusions  - Left ventricle: The cavity size was normal. There was moderate   focal basal hypertrophy of the septum. Systolic function was   normal. The estimated ejection fraction was in the range of 55%   to 60%. Wall motion was normal; there were no regional wall   motion abnormalities. - Aortic valve: Transvalvular velocity was within the normal range.   There was no stenosis. There was no regurgitation. - Mitral valve: Mildly calcified annulus. Transvalvular velocity   was within the normal range. There was no evidence for stenosis.   There was trivial regurgitation. - Left atrium: The atrium was severely dilated. - Right ventricle: The cavity size was normal. Wall thickness was   normal. Systolic function was normal. -  Atrial septum: There appears to be a  patent foramen ovale with   left to right flow at rest. - Tricuspid valve: There was mild regurgitation. - Pulmonary arteries: Systolic pressure was within the normal   range. PA peak pressure: 27 mm Hg (S).  Bilateral carotid duplex ultrasound on 05/01/2017 Final Interpretation: Right Carotid: There is evidence in the right ICA of a 1-39% stenosis.  Left Carotid: There is evidence in the left ICA of a 1-39% stenosis.  Vertebrals: Both vertebral arteries were patent with antegrade flow.  Antimicrobials: Off Doxycycline   Subjective: Patient seen and examined at bedside.  He feels slightly better than yesterday but still feels very weak and tired. No overnight fever or vomiting. No increased shortness of breath Objective: Vitals:   05/08/17 0056 05/08/17 0145 05/08/17 0300 05/08/17 0415  BP: 100/60  105/74 (!) 100/58  Pulse:   (!) 57 (!) 53  Resp:      Temp:    98.2 F (36.8 C)  TempSrc:    Oral  SpO2: 96% 100%  98%  Weight:      Height:        Intake/Output Summary (Last 24 hours) at 05/08/2017 1032 Last data filed at 05/08/2017 0849 Gross per 24 hour  Intake 927 ml  Output 800 ml  Net 127 ml   Filed Weights   05/05/17 0537 05/06/17 0500 05/07/17 0630  Weight: 72 kg (158 lb 11.7 oz) 73.5 kg (162 lb 0.6 oz) 73.1 kg (161 lb 2.5 oz)    Examination:  General exam: Looks older than stated age.  No acute distress  respiratory system: Bilateral decreased breath sounds at bases with scattered basilar crackles Cardiovascular system: Mild bradycardia, S1-S2 heard gastrointestinal system: Abdomen is nondistended, soft and nontender. Normal bowel sounds heard. Extremities: No cyanosis, clubbing; no lower extremity edema. Trace upper extremity edema, much improved compared to yesterday  Data Reviewed: I have personally reviewed following labs and imaging studies  CBC: Recent Labs  Lab 05/04/17 0813 05/05/17 0413 05/06/17 0430  05/07/17 0500 05/08/17 0401  WBC 3.5* 3.3* 3.0* 2.4* 3.0*  NEUTROABS 2.1 2.0 1.9 1.5* 1.8  HGB 11.5* 12.2* 9.8* 9.3* 10.3*  HCT 35.0* 36.5* 30.0* 28.4* 32.0*  MCV 95.1 94.8 95.5 96.6 96.1  PLT 298 285 251 230 401   Basic Metabolic Panel: Recent Labs  Lab 05/04/17 0813 05/05/17 0413 05/06/17 0430 05/07/17 0500 05/08/17 0401  NA 135 136 137 138 137  K 3.8 4.1 3.6 3.7 3.4*  CL 100* 100* 101 103 102  CO2 _0 GLUCOSE 85 81 87 85 90  BUN 27* 27* 25* 24* 23*  CREATININE 1.08 1.08 1.04 0.91 0.99  CALCIUM 8.0* 8.2* 8.3* 8.4* 8.4*  MG 1.5*  --  1.9 1.8 1.7   GFR: Estimated Creatinine Clearance: 68.7 mL/min (by C-G formula based on SCr of 0.99 mg/dL). Liver Function Tests: Recent Labs  Lab 05/04/17 0813 05/05/17 0413 05/06/17 0430 05/08/17 0401  AST 22  --  20  --   ALT 12*  --  11*  --   ALKPHOS 176*  --  172*  --   BILITOT 0.6  --  1.0  --   PROT 4.3*  --  4.6*  --   ALBUMIN 1.5* 1.5* 2.2* 2.5*   No results for input(s): LIPASE, AMYLASE in the last 168 hours. No results for input(s): AMMONIA in the last 168 hours. Coagulation Profile: No results for input(s): INR, PROTIME in the last 168 hours. Cardiac Enzymes: No results for  input(s): CKTOTAL, CKMB, CKMBINDEX, TROPONINI in the last 168 hours. BNP (last 3 results) No results for input(s): PROBNP in the last 8760 hours. HbA1C: No results for input(s): HGBA1C in the last 72 hours. CBG: No results for input(s): GLUCAP in the last 168 hours. Lipid Profile: No results for input(s): CHOL, HDL, LDLCALC, TRIG, CHOLHDL, LDLDIRECT in the last 72 hours. Thyroid Function Tests: No results for input(s): TSH, T4TOTAL, FREET4, T3FREE, THYROIDAB in the last 72 hours. Anemia Panel: No results for input(s): VITAMINB12, FOLATE, FERRITIN, TIBC, IRON, RETICCTPCT in the last 72 hours. Sepsis Labs: No results for input(s): PROCALCITON, LATICACIDVEN in the last 168 hours.  Recent Results (from the past 240 hour(s))    Culture, Urine     Status: None   Collection Time: 05/02/17 10:57 AM  Result Value Ref Range Status   Specimen Description URINE, CLEAN CATCH  Final   Special Requests NONE  Final   Culture   Final    NO GROWTH Performed at [facility], [address], [place], [place] [address]    Report Status 05/03/2017 FINAL  Final         Radiology Studies: Dg Chest Port 1 View  Result Date: 05/08/2017 CLINICAL DATA:  71 year old male with a history of dyspnea EXAM: PORTABLE CHEST 1 VIEW COMPARISON:  05/06/2017, 04/30/2017 FINDINGS: Cardiomediastinal silhouette unchanged, partially obscured by overlying lung and pleural disease. Persisting bilateral interstitial opacities and interlobular septal thickening with failed opacities at the bilateral lung bases. pleuroparenchymal thickening bilaterally persists. Calcified lymph nodes of the mediastinum again visualized. No displaced fracture. IMPRESSION: Similar appearance of the chest x-ray, with persisting diffuse mixed interstitial and airspace disease, and bilateral pleural effusions, with partial loculation on the right. Electronically Signed   By: Corrie Mckusick D.O.   On: 05/08/2017 07:43        Scheduled Meds: . acyclovir  400 mg Oral Daily  . docusate sodium  100-300 mg Oral Daily  . feeding supplement  1 Container Oral TID BM  . magnesium oxide  400 mg Oral Daily  . midodrine  10 mg Oral TID WC  . mirtazapine  7.5 mg Oral QHS  . rivaroxaban  20 mg Oral Q supper  . senna-docusate  2 tablet Oral BID  . sodium chloride flush  3 mL Intravenous Q12H   Continuous Infusions: . sodium chloride Stopped (05/05/17 0913)     LOS: 8 days        Aline August, MD [facility] Pager [phone]  If 7PM-7AM, please contact night-coverage [email or web address] Password [facility] 05/08/2017, 10:32 AM

## 2017-05-08 NOTE — Progress Notes (Signed)
Progress Note  Patient Name: [name] Date of Encounter: 05/08/2017  Primary Cardiologist: Dr. Aundra Dubin (CHF)  Subjective   Feeling a little better this AM - not dizzy - but still quite weak and becomes winded with mild activity which he states is chronic.  Inpatient Medications    Scheduled Meds: . acyclovir  400 mg Oral Daily  . docusate sodium  100-300 mg Oral Daily  . feeding supplement  1 Container Oral TID BM  . furosemide  20 mg Intravenous Q12H  . midodrine  10 mg Oral TID WC  . mirtazapine  7.5 mg Oral QHS  . rivaroxaban  20 mg Oral Q supper  . senna-docusate  2 tablet Oral BID  . sodium chloride flush  3 mL Intravenous Q12H   Continuous Infusions: . sodium chloride Stopped (05/05/17 0913)   PRN Meds: sodium chloride, albuterol, magnesium hydroxide, morphine, MUSCLE RUB, ondansetron (ZOFRAN) IV, oxyCODONE-acetaminophen, polyethylene glycol, sodium chloride flush   Vital Signs    Vitals:   05/08/17 0056 05/08/17 0145 05/08/17 0300 05/08/17 0415  BP: 100/60  105/74 (!) 100/58  Pulse:   (!) 57 (!) 53  Resp:      Temp:    98.2 F (36.8 C)  TempSrc:    Oral  SpO2: 96% 100%  98%  Weight:      Height:        Intake/Output Summary (Last 24 hours) at 05/08/2017 0819 Last data filed at 05/08/2017 0600 Gross per 24 hour  Intake 687 ml  Output 800 ml  Net -113 ml   Filed Weights   05/05/17 0537 05/06/17 0500 05/07/17 0630  Weight: 158 lb 11.7 oz (72 kg) 162 lb 0.6 oz (73.5 kg) 161 lb 2.5 oz (73.1 kg)    Telemetry    Atrial fib rate controlled occ PVCs - Personally Reviewed   Physical Exam   GEN: Frail cachectic appearing WM NAD  HEENT: Normocephalic, atraumatic, sclera non-icteric. Neck: No JVD or bruits. Cardiac: Irregularly irregular, no murmurs, rubs, or gallops.  Radials/DP/PT 1+ and equal bilaterally.  Respiratory: Coarse and crackly diffusely. Breathing is mildly labored after moving around/repositioning at bed, settles down with  rest. GI: Soft, nontender, non-distended, BS +x 4. MS: no deformity. Extremities: No clubbing or cyanosis. No LE edema but trace UE noted. Distal pedal pulses are 2+ and equal bilaterally. Neuro:  AAOx3. Follows commands. Psych:  Responds to questions appropriately with a normal affect.  Labs    Chemistry Recent Labs  Lab 05/04/17 0813 05/05/17 0413 05/06/17 0430 05/07/17 0500 05/08/17 0401  NA 135 136 137 138 137  K 3.8 4.1 3.6 3.7 3.4*  CL 100* 100* 101 103 102  CO2 _0 GLUCOSE 85 81 87 85 90  BUN 27* 27* 25* 24* 23*  CREATININE 1.08 1.08 1.04 0.91 0.99  CALCIUM 8.0* 8.2* 8.3* 8.4* 8.4*  PROT 4.3*  --  4.6*  --   --   ALBUMIN 1.5* 1.5* 2.2*  --  2.5*  AST 22  --  20  --   --   ALT 12*  --  11*  --   --   ALKPHOS 176*  --  172*  --   --   BILITOT 0.6  --  1.0  --   --   GFRNONAA >60 >60 >60 >60 >60  GFRAA >60 >60 >60 >60 >60  ANIONGAP _1 4* 4*     Hematology Recent Labs  Lab  05/06/17 0430 05/07/17 0500 05/08/17 0401  WBC 3.0* 2.4* 3.0*  RBC 3.14* 2.94* 3.33*  HGB 9.8* 9.3* 10.3*  HCT 30.0* 28.4* 32.0*  MCV 95.5 96.6 96.1  MCH 31.2 31.6 30.9  MCHC 32.7 32.7 32.2  RDW 17.5* 17.6* 17.7*  PLT 251 230 243    Cardiac EnzymesNo results for input(s): TROPONINI in the last 168 hours. No results for input(s): TROPIPOC in the last 168 hours.   BNPNo results for input(s): BNP, PROBNP in the last 168 hours.   DDimer No results for input(s): DDIMER in the last 168 hours.   Radiology    Dg Chest Port 1 View  Result Date: 05/08/2017 CLINICAL DATA:  71 year old male with a history of dyspnea EXAM: PORTABLE CHEST 1 VIEW COMPARISON:  05/06/2017, 04/30/2017 FINDINGS: Cardiomediastinal silhouette unchanged, partially obscured by overlying lung and pleural disease. Persisting bilateral interstitial opacities and interlobular septal thickening with failed opacities at the bilateral lung bases. pleuroparenchymal thickening bilaterally persists. Calcified lymph  nodes of the mediastinum again visualized. No displaced fracture. IMPRESSION: Similar appearance of the chest x-ray, with persisting diffuse mixed interstitial and airspace disease, and bilateral pleural effusions, with partial loculation on the right. Electronically Signed   By: Corrie Mckusick D.O.   On: 05/08/2017 07:43    Cardiac Studies  2D echo 05/01/17 - Left ventricle: The cavity size was normal. There was moderate   focal basal hypertrophy of the septum. Systolic function was   normal. The estimated ejection fraction was in the range of 55%   to 60%. Wall motion was normal; there were no regional wall   motion abnormalities. - Aortic valve: Transvalvular velocity was within the normal range.   There was no stenosis. There was no regurgitation. - Mitral valve: Mildly calcified annulus. Transvalvular velocity   was within the normal range. There was no evidence for stenosis.   There was trivial regurgitation. - Left atrium: The atrium was severely dilated. - Right ventricle: The cavity size was normal. Wall thickness was   normal. Systolic function was normal. - Atrial septum: There appears to be a patent foramen ovale with   left to right flow at rest. - Tricuspid valve: There was mild regurgitation. - Pulmonary arteries: Systolic pressure was within the normal   range. PA peak pressure: 27 mm Hg (S).  Patient Profile     71 y.o. 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 Recent admission 02/2017 with respiratory failure and volume overload. Hospital course complicated by atrial fibrillation, acute on chronic diastolic CHF,  nephrotic syndrome, pleural effusion, orthostatic hypotension requiring midodrine, and diagnosis of AL amyloidosis. Hematology consulted  He was cardioverted to NSR during this admission but atrial fibrillation had recurred at hospital followup 11/26 thus rate control strategy was pursued. Patienthas beenvolume overloaded but diuresis has been difficult due to low BP and hypoalbuminemia. He was readmitted with syncope and profound orthostasis.   Assessment & Plan    1. Orthostatic hypotension/syncope - slightly improved on higher dose of midodrine. Has not yet used abdominal binder but nurse states it had been ordered. He is not sure he will tolerate this due to sensitive skin. Compression hose have also been recommended. Given that there is no easy solution to this and  his progressive decline this year it may be unreasonable to expect complete resolution to this issue. In order to avoid falls he may be more dependent on assistive device such as wheelchair particularly since he is on Xarelto and is at risk for falls.  2. Chronic diastolic CHF - weight is currently 1lb below OV with Dr. Aundra Dubin recently. As he had stated, diuresis difficult due to his orthostasis and nephrotic syndrome. Lasix was held overnight due to hypotension but he did get a dose at 1am.  I will hold further doses and review perhaps transition to oral form with Dr. Percival Spanish. Lungs continue to sound coarse bilaterally, however. Again, as above, no easy solution. I question whether he is reaching the point where palliative discussion needs to take place. This is my first day meeting this patient thus will await MD input regarding this sensitive topic. He received albumin this admission per IM but this has been limited to development of UE edema.  3. Hypokalemia/hypomagnesemia -IM team repleted potassium. Mg remains lower normal, will start MagOx 462m daily.  4. NSVT - 16 beats reported earlier in notes, telemetry most recently without  recurrence. BP too low for BB. See above re: lytes.  5. Anemia, above malignancies, and + FOBT - per IM.  6. Minimal troponin elevation - no recent CP. This has been felt due to demand ischemia, but does portend a poorer prognosis overall in light of multiple comorbidities. He is not on aspirin due to Xarelto.  7. Persistent afib - continue rate control strategy and Xarelto. Dose appropriate.  For questions or updates, please contact CFillmorePlease consult [email or web address] for contact info under Cardiology/STEMI.  Signed, DCharlie Pitter PA-C 05/08/2017, 8:19 AM    History and all data above reviewed.  Patient examined.  I agree with the findings as above.  He is weak but in no distress.  Denies chest pain or SOB.  Has chronic pain in his skin.   The patient exam reveals COR:RRR  ,  Lungs: Clear  ,  Abd: Positive bowel sounds, no rebound no guarding, Ext No leg edema, positive arm edema  .  All available labs, radiology testing, previous records reviewed. Agree with documented assessment and plan. ORTHOSTASIS:  I asked the nurses to apply the abdominal binder and repeat his orthostatic readings today.  As above, our therapies are very limited.  Diuretic will likely be PRN.  At this point, I would hold off on diuretic as long as he is oxygenating well.   JJeneen RinksHochrein  11:41 AM  05/08/2017

## 2017-05-08 NOTE — Progress Notes (Signed)
Pt had 67m IV lasix due at 2200. Pt BP at 2209 was 89/62, NP K. KBaltazar Najjarpaged and asked if RN should hold medication. No new orders given, RN held lasix. Pt BP at 0050 was 100/60, NP K. KBaltazar Najjarpaged at 0100 and asked if RN should continue to hold IV lasix. NP KTylene Fantasiacalled RN and said to administer the IV lasix.

## 2017-05-09 ENCOUNTER — Inpatient Hospital Stay (HOSPITAL_COMMUNITY): Payer: Medicare Other

## 2017-05-09 DIAGNOSIS — Z515 Encounter for palliative care: Secondary | ICD-10-CM

## 2017-05-09 DIAGNOSIS — Z7189 Other specified counseling: Secondary | ICD-10-CM

## 2017-05-09 DIAGNOSIS — C8300 Small cell B-cell lymphoma, unspecified site: Secondary | ICD-10-CM

## 2017-05-09 DIAGNOSIS — I481 Persistent atrial fibrillation: Secondary | ICD-10-CM

## 2017-05-09 DIAGNOSIS — E8581 Light chain (AL) amyloidosis: Secondary | ICD-10-CM

## 2017-05-09 LAB — CBC WITH DIFFERENTIAL/PLATELET
BASOS ABS: 0 10*3/uL (ref 0.0–0.1)
Basophils Relative: 1 %
EOS PCT: 4 %
Eosinophils Absolute: 0.1 10*3/uL (ref 0.0–0.7)
HCT: 30.5 % — ABNORMAL LOW (ref 39.0–52.0)
HEMOGLOBIN: 10 g/dL — AB (ref 13.0–17.0)
LYMPHS ABS: 0.3 10*3/uL — AB (ref 0.7–4.0)
LYMPHS PCT: 12 %
MCH: 31.4 pg (ref 26.0–34.0)
MCHC: 32.8 g/dL (ref 30.0–36.0)
MCV: 95.9 fL (ref 78.0–100.0)
Monocytes Absolute: 0.7 10*3/uL (ref 0.1–1.0)
Monocytes Relative: 25 %
NEUTROS ABS: 1.6 10*3/uL — AB (ref 1.7–7.7)
NEUTROS PCT: 58 %
PLATELETS: 239 10*3/uL (ref 150–400)
RBC: 3.18 MIL/uL — AB (ref 4.22–5.81)
RDW: 17.8 % — ABNORMAL HIGH (ref 11.5–15.5)
WBC: 2.8 10*3/uL — AB (ref 4.0–10.5)

## 2017-05-09 LAB — BASIC METABOLIC PANEL
ANION GAP: 6 (ref 5–15)
BUN: 24 mg/dL — ABNORMAL HIGH (ref 6–20)
CHLORIDE: 103 mmol/L (ref 101–111)
CO2: 29 mmol/L (ref 22–32)
CREATININE: 0.99 mg/dL (ref 0.61–1.24)
Calcium: 8.4 mg/dL — ABNORMAL LOW (ref 8.9–10.3)
GFR calc non Af Amer: >60 mL/min (ref >60)
Glucose, Bld: 89 mg/dL (ref 65–99)
POTASSIUM: 4 mmol/L (ref 3.5–5.1)
SODIUM: 138 mmol/L (ref 135–145)

## 2017-05-09 LAB — MAGNESIUM: MAGNESIUM: 1.6 mg/dL — AB (ref 1.7–2.4)

## 2017-05-09 MED ORDER — SALINE SPRAY 0.65 % NA SOLN
1.0000 | NASAL | Status: DC | PRN
  Filled 2017-05-09: qty 44

## 2017-05-09 MED ORDER — MAGNESIUM OXIDE 400 (241.3 MG) MG PO TABS
400.0000 mg | ORAL_TABLET | Freq: Every day | ORAL | Status: DC
  Administered 2017-05-10 – 2017-05-12 (×3): 400 mg via ORAL
  Filled 2017-05-09 (×3): qty 1

## 2017-05-09 MED ORDER — MAGNESIUM SULFATE 2 GM/50ML IV SOLN
2.0000 g | Freq: Once | INTRAVENOUS | Status: AC
  Administered 2017-05-09: 2 g via INTRAVENOUS
  Filled 2017-05-09: qty 50

## 2017-05-09 NOTE — Progress Notes (Signed)
Progress Note  Patient Name: [name] Date of Encounter: 05/09/2017  Primary Cardiologist: [name], MD   Subjective   71 year old gentleman with a history of COPD, on home oxygen, chronic diastolic congestive heart failure, atrial fibrillation  Patient has had continued issues with orthostatic hypotension and syncope.  Inpatient Medications    Scheduled Meds: . acyclovir  400 mg Oral Daily  . docusate sodium  100-300 mg Oral Daily  . feeding supplement  1 Container Oral Q24H  . feeding supplement (ENSURE ENLIVE)  237 mL Oral Q24H  . [START ON 05/10/2017] magnesium oxide  400 mg Oral Daily  . midodrine  10 mg Oral TID WC  . mirtazapine  7.5 mg Oral QHS  . rivaroxaban  20 mg Oral Q supper  . senna-docusate  2 tablet Oral BID  . sodium chloride flush  3 mL Intravenous Q12H   Continuous Infusions: . sodium chloride Stopped (05/05/17 0913)  . magnesium sulfate 1 - 4 g bolus IVPB     PRN Meds: sodium chloride, albuterol, magnesium hydroxide, morphine, MUSCLE RUB, ondansetron (ZOFRAN) IV, oxyCODONE-acetaminophen, polyethylene glycol, sodium chloride flush   Vital Signs    Vitals:   05/08/17 1524 05/08/17 2029 05/09/17 0452 05/09/17 0517  BP:  106/74 98/71   Pulse:  (!) 102 72   Resp: _0 Temp: 98 F (36.7 C) 97.8 F (36.6 C) 98 F (36.7 C)   TempSrc: Oral Oral Oral   SpO2:  94% 94%   Weight:    165 lb 5.5 oz (75 kg)  Height:        Intake/Output Summary (Last 24 hours) at 05/09/2017 1109 Last data filed at 05/09/2017 0453 Gross per 24 hour  Intake 600 ml  Output 800 ml  Net -200 ml   Filed Weights   05/06/17 0500 05/07/17 0630 05/09/17 0517  Weight: 162 lb 0.6 oz (73.5 kg) 161 lb 2.5 oz (73.1 kg) 165 lb 5.5 oz (75 kg)    Telemetry     Atrial fib - Personally Reviewed  ECG       Physical Exam   GEN:  Elderly gentleman, appears chronically ill. Neck: No JVD Cardiac:  Irregularly irregular, systolic murmur Respiratory: Clear  to auscultation bilaterally. GI:  thin  MS:  no significant edema  Neuro:  Nonfocal  Psych: Normal affect   Labs    Chemistry Recent Labs  Lab 05/04/17 0813 05/05/17 0413 05/06/17 0430 05/07/17 0500 05/08/17 0401 05/09/17 0353  NA 135 136 137 138 137 138  K 3.8 4.1 3.6 3.7 3.4* 4.0  CL 100* 100* 101 103 102 103  CO2 _1 GLUCOSE 85 81 87 85 90 89  BUN 27* 27* 25* 24* 23* 24*  CREATININE 1.08 1.08 1.04 0.91 0.99 0.99  CALCIUM 8.0* 8.2* 8.3* 8.4* 8.4* 8.4*  PROT 4.3*  --  4.6*  --   --   --   ALBUMIN 1.5* 1.5* 2.2*  --  2.5*  --   AST 22  --  20  --   --   --   ALT 12*  --  11*  --   --   --   ALKPHOS 176*  --  172*  --   --   --   BILITOT 0.6  --  1.0  --   --   --   GFRNONAA >60 >60 >60 >60 >60 >60  GFRAA >60 >60 >60 >60 >60 >60  ANIONGAP _0 4* 4* 6     Hematology Recent Labs  Lab 05/07/17 0500 05/08/17 0401 05/09/17 0353  WBC 2.4* 3.0* 2.8*  RBC 2.94* 3.33* 3.18*  HGB 9.3* 10.3* 10.0*  HCT 28.4* 32.0* 30.5*  MCV 96.6 96.1 95.9  MCH 31.6 30.9 31.4  MCHC 32.7 32.2 32.8  RDW 17.6* 17.7* 17.8*  PLT 230 243 239    Cardiac EnzymesNo results for input(s): TROPONINI in the last 168 hours. No results for input(s): TROPIPOC in the last 168 hours.   BNPNo results for input(s): BNP, PROBNP in the last 168 hours.   DDimer No results for input(s): DDIMER in the last 168 hours.   Radiology    Dg Chest Port 1 View  Result Date: 05/09/2017 CLINICAL DATA:  Shortness of Breath EXAM: PORTABLE CHEST 1 VIEW COMPARISON:  May 08, 2017 FINDINGS: There is cardiomegaly with pulmonary venous hypertension. There are pleural effusions bilaterally. There is interstitial edema throughout the lungs. There is patchy alveolar opacity in portions of the lung bases, stable. There is no evident new opacity. There are calcified lymph nodes in the right hilar and azygos regions, stable. There is also a small calcified granuloma in the right upper lobe. IMPRESSION:  Cardiomegaly with bilateral pleural effusions and pulmonary venous hypertension. There is interstitial pulmonary edema. There may be mild alveolar opacity in the lung bases as well. The overall appearance is felt to be indicative of congestive heart failure. Evidence of prior granulomatous disease. Electronically Signed   By: Lowella Grip III M.D.   On: 05/09/2017 07:11   Dg Chest Port 1 View  Result Date: 05/08/2017 CLINICAL DATA:  71 year old male with a history of dyspnea EXAM: PORTABLE CHEST 1 VIEW COMPARISON:  05/06/2017, 04/30/2017 FINDINGS: Cardiomediastinal silhouette unchanged, partially obscured by overlying lung and pleural disease. Persisting bilateral interstitial opacities and interlobular septal thickening with failed opacities at the bilateral lung bases. pleuroparenchymal thickening bilaterally persists. Calcified lymph nodes of the mediastinum again visualized. No displaced fracture. IMPRESSION: Similar appearance of the chest x-ray, with persisting diffuse mixed interstitial and airspace disease, and bilateral pleural effusions, with partial loculation on the right. Electronically Signed   By: Corrie Mckusick D.O.   On: 05/08/2017 07:43    Cardiac Studies      Patient Profile     71 y.o. male with nephrotic syndrome and orthostatic hypotension.  Assessment & Plan    1.  Nephrotic syndrome: The patient does not have much edema at this point.  He was previously on Lasix but has not had any in 2 days.  He seems to be maintaining without the Lasix for now.  Wife is interested in decreasing or eliminating the Lasix as much as possible because it does cause his orthostatic hypotension to worsen.  2.  Orthostatic hypertension: Patient is been largely confined to the bed.  Physical therapy.  Worried that he is becoming significantly deconditioned.  3.  Atrial fibrillation: Heart rate is well controlled.  Continue Xarelto.  For questions or updates, please contact Ken Caryl Please consult [email or web address] for contact info under Cardiology/STEMI.      Signed, Mertie Moores, MD  05/09/2017, 11:09 AM

## 2017-05-09 NOTE — Progress Notes (Signed)
PROGRESS NOTE    [name]  BSW:[id number] DOB: [birth year] DOA: 04/30/2017 PCP: Biagio Borg, MD     Brief Narrative:  [name] is a 71 year old male with history of COPD, chronic hypoxic respiratory failure on home oxygen, chronic diastolic CHF, prostate cancer, depression, anxiety, skin cancer, lymphoma on chemotherapy, A. fib on Xarelto presented with syncope. It was initially thought that patient had volume overload and was started on Lasix. Lower extremity edema improved, however Lasix was discontinued because of hypotension. Patient is still very deconditioned. Cardiology was consulted for CHF and syncope.  Assessment & Plan:   Principal Problem:   Acute on chronic diastolic (congestive) heart failure (HCC) Active Problems:   Protein-calorie malnutrition, severe (HCC)   Persistent atrial fibrillation (HCC)   Malignant lymphoplasmacytic lymphoma (HCC)   Syncope and collapse   Pressure injury of skin   Malnutrition of moderate degree  Syncope secondary to orthostatic hypotension -Noted to have SBP 35 with syncope  -Fludrocortisone was discontinued due to edema -Patient currently on TED hose, Midrin 10 mg 3 times daily  Acute on chronic diastolic heart failure -He does have upper extremity edema, but no lower extremity edema today  -No further Lasix ordered for now   Anasarca  -Secondary to heart failure, severe protein calorie malnutrition, hypoalbuminemia, nephrotic syndrome  Recent diagnosis of AL amyloidosis and nephrotic syndrome -Follow-up with nephrology as outpatient  Persistent atrial fibrillation -Rate controlled, CHADSVASc 2, continue Xarelto  Malignant lymphoplasmacytic lymphoma -On chemotherapy, followed by oncology as outpatient  Elevated troponin secondary to demand ischemia from significant hypotension -No further evaluation  Generalized deconditioning -PT OT evaluation, recommending HHPT   Hypomagnesemia -Replace, trend   Goals  of care -Patient has multiple medical comorbidities listed as above.  Very difficult case with poor prognosis overall as patient has several medical issues contributing to his overall decline.  Palliative care medicine consulted.   DVT prophylaxis: Xarelto Code Status: Full Family Communication: No family at bedside Disposition Plan: Pending further stabilization, Palliative care medicine consult   Consultants:   Cardiology  Palliative care   Procedures:   None   Antimicrobials:  Anti-infectives (From admission, onward)   Start     Dose/Rate Route Frequency Ordered Stop   04/30/17 2200  doxycycline (VIBRA-TABS) tablet 100 mg     100 mg Oral 2 times daily 04/30/17 1708 05/02/17 2110   04/30/17 1800  acyclovir (ZOVIRAX) tablet 400 mg     400 mg Oral Daily 04/30/17 1708         Subjective: No new complaints, had weeping of his upper extremities this morning.  Denies any pain or shortness of breath.  Objective: Vitals:   05/08/17 1524 05/08/17 2029 05/09/17 0452 05/09/17 0517  BP:  106/74 98/71   Pulse:  (!) 102 72   Resp: _0 Temp: 98 F (36.7 C) 97.8 F (36.6 C) 98 F (36.7 C)   TempSrc: Oral Oral Oral   SpO2:  94% 94%   Weight:    75 kg (165 lb 5.5 oz)  Height:        Intake/Output Summary (Last 24 hours) at 05/09/2017 1404 Last data filed at 05/09/2017 0453 Gross per 24 hour  Intake 480 ml  Output 800 ml  Net -320 ml   Filed Weights   05/06/17 0500 05/07/17 0630 05/09/17 0517  Weight: 73.5 kg (162 lb 0.6 oz) 73.1 kg (161 lb 2.5 oz) 75 kg (165 lb 5.5 oz)  Examination:  General exam: Appears calm and comfortable  Respiratory system: Clear to auscultation. Respiratory effort normal. Cardiovascular system: S1 & S2 heard, Irregular rhythm rate 70s. No JVD, murmurs, rubs, gallops or clicks. No pedal edema. +bilateral UE pitting edema  Gastrointestinal system: Abdomen is nondistended, soft and nontender. No organomegaly or masses felt. Normal bowel  sounds heard. Central nervous system: Alert and oriented. No focal neurological deficits. Extremities: Symmetric Skin: No rashes, lesions or ulcers Psychiatry: Judgement and insight appear normal. Mood & affect appropriate.   Data Reviewed: I have personally reviewed following labs and imaging studies  CBC: Recent Labs  Lab 05/05/17 0413 05/06/17 0430 05/07/17 0500 05/08/17 0401 05/09/17 0353  WBC 3.3* 3.0* 2.4* 3.0* 2.8*  NEUTROABS 2.0 1.9 1.5* 1.8 1.6*  HGB 12.2* 9.8* 9.3* 10.3* 10.0*  HCT 36.5* 30.0* 28.4* 32.0* 30.5*  MCV 94.8 95.5 96.6 96.1 95.9  PLT 285 251 230 243 929   Basic Metabolic Panel: Recent Labs  Lab 05/04/17 0813 05/05/17 0413 05/06/17 0430 05/07/17 0500 05/08/17 0401 05/09/17 0353  NA 135 136 137 138 137 138  K 3.8 4.1 3.6 3.7 3.4* 4.0  CL 100* 100* 101 103 102 103  CO2 _0 GLUCOSE 85 81 87 85 90 89  BUN 27* 27* 25* 24* 23* 24*  CREATININE 1.08 1.08 1.04 0.91 0.99 0.99  CALCIUM 8.0* 8.2* 8.3* 8.4* 8.4* 8.4*  MG 1.5*  --  1.9 1.8 1.7 1.6*   GFR: Estimated Creatinine Clearance: 70.5 mL/min (by C-G formula based on SCr of 0.99 mg/dL). Liver Function Tests: Recent Labs  Lab 05/04/17 0813 05/05/17 0413 05/06/17 0430 05/08/17 0401  AST 22  --  20  --   ALT 12*  --  11*  --   ALKPHOS 176*  --  172*  --   BILITOT 0.6  --  1.0  --   PROT 4.3*  --  4.6*  --   ALBUMIN 1.5* 1.5* 2.2* 2.5*   No results for input(s): LIPASE, AMYLASE in the last 168 hours. No results for input(s): AMMONIA in the last 168 hours. Coagulation Profile: No results for input(s): INR, PROTIME in the last 168 hours. Cardiac Enzymes: No results for input(s): CKTOTAL, CKMB, CKMBINDEX, TROPONINI in the last 168 hours. BNP (last 3 results) No results for input(s): PROBNP in the last 8760 hours. HbA1C: No results for input(s): HGBA1C in the last 72 hours. CBG: No results for input(s): GLUCAP in the last 168 hours. Lipid Profile: No results for input(s):  CHOL, HDL, LDLCALC, TRIG, CHOLHDL, LDLDIRECT in the last 72 hours. Thyroid Function Tests: No results for input(s): TSH, T4TOTAL, FREET4, T3FREE, THYROIDAB in the last 72 hours. Anemia Panel: No results for input(s): VITAMINB12, FOLATE, FERRITIN, TIBC, IRON, RETICCTPCT in the last 72 hours. Sepsis Labs: No results for input(s): PROCALCITON, LATICACIDVEN in the last 168 hours.  Recent Results (from the past 240 hour(s))  Culture, Urine     Status: None   Collection Time: 05/02/17 10:57 AM  Result Value Ref Range Status   Specimen Description URINE, CLEAN CATCH  Final   Special Requests NONE  Final   Culture   Final    NO GROWTH Performed at [facility], [address], [place], [place] [address]    Report Status 05/03/2017 FINAL  Final       Radiology Studies: Dg Chest Port 1 View  Result Date: 05/09/2017 CLINICAL DATA:  Shortness of Breath EXAM: PORTABLE CHEST 1  VIEW COMPARISON:  May 08, 2017 FINDINGS: There is cardiomegaly with pulmonary venous hypertension. There are pleural effusions bilaterally. There is interstitial edema throughout the lungs. There is patchy alveolar opacity in portions of the lung bases, stable. There is no evident new opacity. There are calcified lymph nodes in the right hilar and azygos regions, stable. There is also a small calcified granuloma in the right upper lobe. IMPRESSION: Cardiomegaly with bilateral pleural effusions and pulmonary venous hypertension. There is interstitial pulmonary edema. There may be mild alveolar opacity in the lung bases as well. The overall appearance is felt to be indicative of congestive heart failure. Evidence of prior granulomatous disease. Electronically Signed   By: Lowella Grip III M.D.   On: 05/09/2017 07:11   Dg Chest Port 1 View  Result Date: 05/08/2017 CLINICAL DATA:  71 year old male with a history of dyspnea EXAM: PORTABLE CHEST 1 VIEW COMPARISON:  05/06/2017, 04/30/2017 FINDINGS: Cardiomediastinal  silhouette unchanged, partially obscured by overlying lung and pleural disease. Persisting bilateral interstitial opacities and interlobular septal thickening with failed opacities at the bilateral lung bases. pleuroparenchymal thickening bilaterally persists. Calcified lymph nodes of the mediastinum again visualized. No displaced fracture. IMPRESSION: Similar appearance of the chest x-ray, with persisting diffuse mixed interstitial and airspace disease, and bilateral pleural effusions, with partial loculation on the right. Electronically Signed   By: Corrie Mckusick D.O.   On: 05/08/2017 07:43      Scheduled Meds: . acyclovir  400 mg Oral Daily  . docusate sodium  100-300 mg Oral Daily  . feeding supplement  1 Container Oral Q24H  . feeding supplement (ENSURE ENLIVE)  237 mL Oral Q24H  . [START ON 05/10/2017] magnesium oxide  400 mg Oral Daily  . midodrine  10 mg Oral TID WC  . mirtazapine  7.5 mg Oral QHS  . rivaroxaban  20 mg Oral Q supper  . senna-docusate  2 tablet Oral BID  . sodium chloride flush  3 mL Intravenous Q12H   Continuous Infusions: . sodium chloride Stopped (05/05/17 0913)     LOS: 9 days    Time spent: 40 minutes   Dessa Phi, DO [facility] [email or web address] Password [email or web address] 05/09/2017, 2:04 PM

## 2017-05-09 NOTE — Care Management Important Message (Signed)
Important Message  Patient Details  Name: [name] MRN: [id number] Date of Birth: 1946-01-13   Medicare Important Message Given:  Yes    Kerin Salen 05/09/2017, 11:02 AMImportant Message  Patient Details  Name: [name] MRN: [id number] Date of Birth: [birth year]   Medicare Important Message Given:  Yes    Kerin Salen 05/09/2017, 11:02 AM

## 2017-05-09 NOTE — Consult Note (Signed)
Consultation Note Date: 05/09/2017   Patient Name: [name]  DOB: [birth year]  MRN: [id number]  Age / Sex: 71 y.o., male  PCP: Biagio Borg, MD Referring Physician: Dessa Phi, DO  Reason for Consultation: Establishing goals of care  HPI/Patient Profile: 71 y.o. male  with past medical history of COPD on 2 L nasal cannula oxygen, diastolic CHF, prostate cancer, depression, anxiety, skin cancer, AL Amyloidosis, nephrotic syndrome, recently diagnosed lymphoplasmacytic lymphoma ([place]) on chemotherapy, cellulitis in groin area, a fib on Xarelto, h/o prostate cancer, s/p VATS Jan 2018 for empyema admitted on 04/30/2017 with syncope with orthostatic hypotension. He struggles with continued hypoalbuminemia and malnutrition and anasarca. Palliative requested to assist with [place] discussion.   Clinical Assessment and Goals of Care: I met today with [name] and we were joined by his wife. He is a very pleasant gentleman who is struggling with his significant decline since June/July and health struggles over this year. He recognizes that his QOL is very poor currently. He is very hopeful for improved status and QOL BUT worried that we will not be able to get him to this point. He admits that he desires to remain hopeful and not ready to discuss his wishes or Advance Directives.   He and his wife tell me their main goal is to keep him at home. For this reason their main concern is to obtain max assistance at home (need wheelchair, transportation for appts, and access to aide/caregiver at times) to make this possible. They have struggled to obtain assistance from [place] (he was in the [employer] for 2 years but did not serve overseas) and request help with navigating [place] system. This is their main concern. His other goal is to get well enough to get back to playing his piano.   Emotional support provided. [name]  acknowledges the limits of medicine and that we may not be able to get him any better but not yet emotionally ready to discuss EOL. Palliative will be available as needed over the weekend - call if needed and I will follow up Tuesday if still hospitalized.   Primary Decision Maker PATIENT    SUMMARY OF RECOMMENDATIONS   - Continues to be hopeful for improvement and currently desires full aggressive care - Goal is to ultimately return home with wife - Also hopeful to be able to play his piano again  Code Status/Advance Care Planning:  Full code   Symptom Management:   Chronic back pain: Continue current regimen. No changes.   Palliative Prophylaxis:   Aspiration, Delirium Protocol, Frequent Pain Assessment and Turn Reposition  Additional Recommendations (Limitations, Scope, Preferences):  Full Scope Treatment  Psycho-social/Spiritual:   Desire for further Chaplaincy support:no  Additional Recommendations: Caregiving  Support/Resources  Prognosis:   Unable to determine  Discharge Planning: To Be Determined      Primary Diagnoses: Present on Admission: . Protein-calorie malnutrition, severe ([place]) . Persistent atrial fibrillation ([place]) . Malignant lymphoplasmacytic lymphoma ([place]) . Acute on chronic diastolic (congestive) heart failure ([place])  I have reviewed the medical record, interviewed the patient and family, and examined the patient. The following aspects are pertinent.  Past Medical History:  Diagnosis Date  . Anxiety   . Arthritis    "neck" (08/15/2015)  . Chronic bronchitis ([place])   . Chronic diastolic CHF (congestive heart failure) ([place])   . Constipation   . COPD (chronic obstructive pulmonary disease) ([place])   . DDD (degenerative disc disease), cervical   . Depression   . Dilated aortic root ([place])    66m on echo 05/2016  . Edema of left lower extremity   . Facial basal cell cancer   . H/O acne vulgaris 1960s   [facility]  . Headache    history of - left temporal- years ago- not current (08/15/2015)  . Persistent atrial fibrillation (HCC)    on coumadin with CHADS2VASC score of 2  . Pneumonia 1960s; 2015 X 2  . Prostate cancer ([place] dx'd early 2000s   "low spreading; non aggressive type" (08/15/2015)  . Pulmonary HTN ([place]    PASP 421mg by echo 05/2016  . Skin cancer    "back"   Social History   Socioeconomic History  . Marital status: Married    Spouse name: None  . Number of children: None  . Years of education: None  . Highest education level: None  Social Needs  . Financial resource strain: None  . Food insecurity - worry: None  . Food insecurity - inability: None  . Transportation needs - medical: None  . Transportation needs - non-medical: None  Occupational History  . None  Tobacco Use  . Smoking status: Former Smoker    Packs/day: 1.00    Years: 62.00    Pack years: 62.00    Types: Cigarettes    Last attempt to quit: 05/14/2016    Years since quitting: 0.9  . Smokeless tobacco: Never Used  Substance and Sexual Activity  . Alcohol use: No    Alcohol/week: 0.0 oz  . Drug use: No    Comment: CBD OIL  . Sexual activity: Not Currently    Partners: Female  Other Topics Concern  . None  Social History Narrative  . None   Family History  Problem Relation Age of Onset  . Colon cancer Mother   . Ovarian cancer Mother   . Alcoholism Father   . CAD Father   . Alcohol abuse Father   . Ovarian cancer Sister   . Diabetes Neg Hx   . Stomach cancer Neg Hx    Scheduled Meds: . acyclovir  400 mg Oral Daily  . docusate sodium  100-300 mg Oral Daily  . feeding supplement  1 Container Oral Q24H  . feeding supplement (ENSURE ENLIVE)  237 mL Oral Q24H  . [START ON 05/10/2017] magnesium oxide  400 mg Oral Daily  . midodrine  10 mg Oral TID WC  . mirtazapine  7.5 mg Oral QHS  . rivaroxaban  20 mg Oral Q supper  . senna-docusate  2 tablet Oral BID  . sodium chloride flush   3 mL Intravenous Q12H   Continuous Infusions: . sodium chloride Stopped (05/05/17 0913)   PRN Meds:.sodium chloride, albuterol, magnesium hydroxide, morphine, MUSCLE RUB, ondansetron (ZOFRAN) IV, oxyCODONE-acetaminophen, polyethylene glycol, sodium chloride flush Allergies  Allergen Reactions  . Demerol [Meperidine] Other (See Comments)    UNSPECIFIED REACTION  Causes system to shutdown. ?   . Oxycodone Hcl Other (See  Comments)    Pt states this medication 'wires him up' and makes pt hyper; pt does not want to take this ever again   Review of Systems  Constitutional: Positive for activity change, appetite change, fatigue and unexpected weight change.  Neurological: Positive for weakness.    Physical Exam  Constitutional: He is oriented to person, place, and time. He has a sickly appearance.  Thin, frail  Cardiovascular: Normal rate. An irregularly irregular rhythm present.  Pulmonary/Chest: Effort normal. No accessory muscle usage. No tachypnea. No respiratory distress.  Abdominal: Normal appearance.  Neurological: He is alert and oriented to person, place, and time.  Skin:  Generalized edema, anasarca  Nursing note and vitals reviewed.   Vital Signs: BP 98/71 (BP Location: Right Arm)   Pulse 72   Temp 98 F (36.7 C) (Oral)   Resp 18   Ht _0  (1.803 m)   Wt 75 kg (165 lb 5.5 oz)   SpO2 94%   BMI 23.06 kg/m  Pain Assessment: 0-10 POSS *See Group Information*: S-Acceptable,Sleep, easy to arouse Pain Score: Asleep   SpO2: SpO2: 94 % O2 Device:SpO2: 94 % O2 Flow Rate: .O2 Flow Rate (L/min): 2 L/min  IO: Intake/output summary:   Intake/Output Summary (Last 24 hours) at 05/09/2017 1351 Last data filed at 05/09/2017 0453 Gross per 24 hour  Intake 480 ml  Output 800 ml  Net -320 ml    LBM: Last BM Date: 05/08/17 Baseline Weight: Weight: 74.4 kg (164 lb 0.4 oz) Most recent weight: Weight: 75 kg (165 lb 5.5 oz)     Palliative Assessment/Data:     Time  Total: 80 min  Greater than 50%  of this time was spent counseling and coordinating care related to the above assessment and plan.  Signed by: Vinie Sill, NP Palliative Medicine Team Pager # [phone] (M-F 8a-5p) Team Phone # [phone] (Nights/Weekends)

## 2017-05-10 DIAGNOSIS — I951 Orthostatic hypotension: Secondary | ICD-10-CM

## 2017-05-10 LAB — BASIC METABOLIC PANEL
Anion gap: 5 (ref 5–15)
BUN: 26 mg/dL — ABNORMAL HIGH (ref 6–20)
CHLORIDE: 103 mmol/L (ref 101–111)
CO2: 30 mmol/L (ref 22–32)
Calcium: 8.3 mg/dL — ABNORMAL LOW (ref 8.9–10.3)
Creatinine, Ser: 1.07 mg/dL (ref 0.61–1.24)
GFR calc non Af Amer: >60 mL/min (ref >60)
Glucose, Bld: 83 mg/dL (ref 65–99)
POTASSIUM: 4.4 mmol/L (ref 3.5–5.1)
SODIUM: 138 mmol/L (ref 135–145)

## 2017-05-10 LAB — MAGNESIUM: MAGNESIUM: 1.9 mg/dL (ref 1.7–2.4)

## 2017-05-10 NOTE — Progress Notes (Addendum)
Progress Note  Patient Name: [name] Date of Encounter: 05/10/2017  Primary Cardiologist: [name], MD   Subjective   71 year old gentleman with a history of atrial fibrillation, nephrotic syndrome, orthostatic hypotension.  Lasix has been held for the past several days.  His edema is significantly better.  He is still orthostatic but is feeling quite well sitting down.  Inpatient Medications    Scheduled Meds: . acyclovir  400 mg Oral Daily  . docusate sodium  100-300 mg Oral Daily  . feeding supplement  1 Container Oral Q24H  . feeding supplement (ENSURE ENLIVE)  237 mL Oral Q24H  . magnesium oxide  400 mg Oral Daily  . midodrine  10 mg Oral TID WC  . mirtazapine  7.5 mg Oral QHS  . rivaroxaban  20 mg Oral Q supper  . senna-docusate  2 tablet Oral BID  . sodium chloride flush  3 mL Intravenous Q12H   Continuous Infusions: . sodium chloride Stopped (05/05/17 0913)   PRN Meds: sodium chloride, albuterol, magnesium hydroxide, morphine, MUSCLE RUB, ondansetron (ZOFRAN) IV, oxyCODONE-acetaminophen, polyethylene glycol, sodium chloride, sodium chloride flush   Vital Signs    Vitals:   05/09/17 0517 05/09/17 1700 05/09/17 2210 05/10/17 0650  BP:   (!) 113/96 101/65  Pulse:   (!) 58 60  Resp:   18 18  Temp:  98.2 F (36.8 C) 97.6 F (36.4 C) 98.2 F (36.8 C)  TempSrc:  Oral Oral Oral  SpO2:  94% 100% 100%  Weight: 165 lb 5.5 oz (75 kg)   165 lb 2 oz (74.9 kg)  Height:        Intake/Output Summary (Last 24 hours) at 05/10/2017 9628 Last data filed at 05/10/2017 0400 Gross per 24 hour  Intake 120 ml  Output 450 ml  Net -330 ml   Filed Weights   05/07/17 0630 05/09/17 0517 05/10/17 0650  Weight: 161 lb 2.5 oz (73.1 kg) 165 lb 5.5 oz (75 kg) 165 lb 2 oz (74.9 kg)    Telemetry    Atrial fib , HR of 66  - Personally Reviewed  ECG     - Personally Reviewed  Physical Exam   GEN:  Elderly gentleman, no acute distress Neck: No JVD Cardiac:   Irregularly irregular no murmurs, rubs, or gallops.  Respiratory: Clear to auscultation bilaterally. GI: Soft, nontender, non-distended  MS: No edema;  Neuro:  Nonfocal  Psych: Normal affect   Labs    Chemistry Recent Labs  Lab 05/04/17 0813 05/05/17 0413 05/06/17 0430  05/08/17 0401 05/09/17 0353 05/10/17 0416  NA 135 136 137   < > 137 138 138  K 3.8 4.1 3.6   < > 3.4* 4.0 4.4  CL 100* 100* 101   < > 102 103 103  CO2 _0 < > _1 GLUCOSE 85 81 87   < > 90 89 83  BUN 27* 27* 25*   < > 23* 24* 26*  CREATININE 1.08 1.08 1.04   < > 0.99 0.99 1.07  CALCIUM 8.0* 8.2* 8.3*   < > 8.4* 8.4* 8.3*  PROT 4.3*  --  4.6*  --   --   --   --   ALBUMIN 1.5* 1.5* 2.2*  --  2.5*  --   --   AST 22  --  20  --   --   --   --   ALT 12*  --  11*  --   --   --   --  ALKPHOS 176*  --  172*  --   --   --   --   BILITOT 0.6  --  1.0  --   --   --   --   GFRNONAA >60 >60 >60   < > >60 >60 >60  GFRAA >60 >60 >60   < > >60 >60 >60  ANIONGAP _0 < > 4* 6 5   < > = values in this interval not displayed.     Hematology Recent Labs  Lab 05/07/17 0500 05/08/17 0401 05/09/17 0353  WBC 2.4* 3.0* 2.8*  RBC 2.94* 3.33* 3.18*  HGB 9.3* 10.3* 10.0*  HCT 28.4* 32.0* 30.5*  MCV 96.6 96.1 95.9  MCH 31.6 30.9 31.4  MCHC 32.7 32.2 32.8  RDW 17.6* 17.7* 17.8*  PLT 230 243 239    Cardiac EnzymesNo results for input(s): TROPONINI in the last 168 hours. No results for input(s): TROPIPOC in the last 168 hours.   BNPNo results for input(s): BNP, PROBNP in the last 168 hours.   DDimer No results for input(s): DDIMER in the last 168 hours.   Radiology    Dg Chest Port 1 View  Result Date: 05/09/2017 CLINICAL DATA:  Shortness of Breath EXAM: PORTABLE CHEST 1 VIEW COMPARISON:  May 08, 2017 FINDINGS: There is cardiomegaly with pulmonary venous hypertension. There are pleural effusions bilaterally. There is interstitial edema throughout the lungs. There is patchy alveolar opacity in  portions of the lung bases, stable. There is no evident new opacity. There are calcified lymph nodes in the right hilar and azygos regions, stable. There is also a small calcified granuloma in the right upper lobe. IMPRESSION: Cardiomegaly with bilateral pleural effusions and pulmonary venous hypertension. There is interstitial pulmonary edema. There may be mild alveolar opacity in the lung bases as well. The overall appearance is felt to be indicative of congestive heart failure. Evidence of prior granulomatous disease. Electronically Signed   By: Lowella Grip III M.D.   On: 05/09/2017 07:11    Cardiac Studies     Patient Profile     71 y.o. male with persistent atrial fibrillation, orthostatic hypotension  Assessment & Plan    1.  Atrial fibrillation: His heart rate is well controlled.  Continue Xarelto  2.  Orthostatic hypotension.  He developed significant edema on Florinef.  Continue midodrine.  He still has orthostasis but overall his symptoms seem to be gradually improving.  The plan is to avoid Lasix if possible.  3.  Nephrotic syndrome: No significant edema at this time.  4.  Acute on chronic presumed diastolic congestive heart failure: Patient is feeling better.  He has a normal left ventricular systolic function.  We were not able to assess his diastolic function because of the atrial fibrillation but it is assumed that he has diastolic dysfunction.  He seems to be quite stable currently off Lasix.   avoid Florinef if possible  For questions or updates, please contact [place] Please consult [email or web address] for contact info under Cardiology/STEMI.      Signed, Mertie Moores, MD  05/10/2017, 9:37 AM

## 2017-05-10 NOTE — Progress Notes (Signed)
PROGRESS NOTE    [name]  WIN:[id number] DOB: February 13, 1946 DOA: 04/30/2017 PCP: Biagio Borg, MD     Brief Narrative:  [name] is a 71 year old male with history of COPD, chronic hypoxic respiratory failure on home oxygen, chronic diastolic CHF, prostate cancer, depression, anxiety, skin cancer, lymphoma on chemotherapy, A. fib on Xarelto presented with syncope. It was initially thought that patient had volume overload and was started on Lasix. Lower extremity edema improved, however Lasix was discontinued because of hypotension. Patient is still very deconditioned. Cardiology was consulted for CHF and syncope.  Assessment & Plan:   Principal Problem:   Acute on chronic diastolic (congestive) heart failure (HCC) Active Problems:   Protein-calorie malnutrition, severe (HCC)   Persistent atrial fibrillation (HCC)   Malignant lymphoplasmacytic lymphoma (HCC)   Syncope and collapse   Pressure injury of skin   Malnutrition of moderate degree   Palliative care encounter  Syncope secondary to orthostatic hypotension -Noted to have SBP 35 with syncope  -Fludrocortisone was discontinued due to edema -Patient currently on TED hose, Midrin 10 mg 3 times daily -Continues to have positive orthostasis. Resting BP stable.   Acute on chronic diastolic heart failure -He does have upper extremity edema, but no lower extremity edema today  -No further Lasix ordered for now   Anasarca  -Secondary to heart failure, severe protein calorie malnutrition, hypoalbuminemia, nephrotic syndrome  Recent diagnosis of AL amyloidosis and nephrotic syndrome -Follow-up with nephrology as outpatient  Persistent atrial fibrillation -Rate controlled, CHADSVASc 2, continue Xarelto  Malignant lymphoplasmacytic lymphoma -On chemotherapy, followed by oncology as outpatient  Elevated troponin secondary to demand ischemia from significant hypotension -No further evaluation  Generalized  deconditioning -PT OT evaluation, recommending HHPT   Hypomagnesemia -Replace, trend   Goals of care -Patient has multiple medical comorbidities listed as above.  Very difficult case with poor prognosis overall as patient has several medical issues contributing to his overall decline.  Palliative care medicine consulted.    DVT prophylaxis: Xarelto Code Status: Full Family Communication: No family at bedside Disposition Plan: Pending further stabilization   Consultants:   Cardiology  Palliative care   Procedures:   None   Antimicrobials:  Anti-infectives (From admission, onward)   Start     Dose/Rate Route Frequency Ordered Stop   04/30/17 2200  doxycycline (VIBRA-TABS) tablet 100 mg     100 mg Oral 2 times daily 04/30/17 1708 05/02/17 2110   04/30/17 1800  acyclovir (ZOVIRAX) tablet 400 mg     400 mg Oral Daily 04/30/17 1708         Subjective: No new complaints. Weak.   Objective: Vitals:   05/09/17 2210 05/10/17 0650 05/10/17 1252 05/10/17 1300  BP: (!) 113/96 101/65 (!) 92/58 (!) 85/58  Pulse: (!) 58 60 62 66  Resp: _0 Temp: 97.6 F (36.4 C) 98.2 F (36.8 C) 97.9 F (36.6 C)   TempSrc: Oral Oral Axillary   SpO2: 100% 100% 99%   Weight:  74.9 kg (165 lb 2 oz)    Height:        Intake/Output Summary (Last 24 hours) at 05/10/2017 1422 Last data filed at 05/10/2017 0400 Gross per 24 hour  Intake -  Output 250 ml  Net -250 ml   Filed Weights   05/07/17 0630 05/09/17 0517 05/10/17 0650  Weight: 73.1 kg (161 lb 2.5 oz) 75 kg (165 lb 5.5 oz) 74.9 kg (165 lb 2 oz)    Examination:  General exam: Appears calm and comfortable  Respiratory system: Clear to auscultation. Respiratory effort normal. Cardiovascular system: S1 & S2 heard, Irregular rhythm rate 60s. No JVD, murmurs, rubs, gallops or clicks. No pedal edema. +bilateral UE edema  Gastrointestinal system: Abdomen is nondistended, soft and nontender. No organomegaly or masses felt. Normal  bowel sounds heard. Central nervous system: Alert and oriented. No focal neurological deficits. Extremities: Symmetric Skin: No rashes, lesions or ulcers Psychiatry: Judgement and insight appear normal. Mood & affect appropriate.   Data Reviewed: I have personally reviewed following labs and imaging studies  CBC: Recent Labs  Lab 05/05/17 0413 05/06/17 0430 05/07/17 0500 05/08/17 0401 05/09/17 0353  WBC 3.3* 3.0* 2.4* 3.0* 2.8*  NEUTROABS 2.0 1.9 1.5* 1.8 1.6*  HGB 12.2* 9.8* 9.3* 10.3* 10.0*  HCT 36.5* 30.0* 28.4* 32.0* 30.5*  MCV 94.8 95.5 96.6 96.1 95.9  PLT 285 251 230 243 592   Basic Metabolic Panel: Recent Labs  Lab 05/06/17 0430 05/07/17 0500 05/08/17 0401 05/09/17 0353 05/10/17 0416  NA 137 138 137 138 138  K 3.6 3.7 3.4* 4.0 4.4  CL 101 103 102 103 103  CO2 _0 GLUCOSE 87 85 90 89 83  BUN 25* 24* 23* 24* 26*  CREATININE 1.04 0.91 0.99 0.99 1.07  CALCIUM 8.3* 8.4* 8.4* 8.4* 8.3*  MG 1.9 1.8 1.7 1.6* 1.9   GFR: Estimated Creatinine Clearance: 65.1 mL/min (by C-G formula based on SCr of 1.07 mg/dL). Liver Function Tests: Recent Labs  Lab 05/04/17 0813 05/05/17 0413 05/06/17 0430 05/08/17 0401  AST 22  --  20  --   ALT 12*  --  11*  --   ALKPHOS 176*  --  172*  --   BILITOT 0.6  --  1.0  --   PROT 4.3*  --  4.6*  --   ALBUMIN 1.5* 1.5* 2.2* 2.5*   No results for input(s): LIPASE, AMYLASE in the last 168 hours. No results for input(s): AMMONIA in the last 168 hours. Coagulation Profile: No results for input(s): INR, PROTIME in the last 168 hours. Cardiac Enzymes: No results for input(s): CKTOTAL, CKMB, CKMBINDEX, TROPONINI in the last 168 hours. BNP (last 3 results) No results for input(s): PROBNP in the last 8760 hours. HbA1C: No results for input(s): HGBA1C in the last 72 hours. CBG: No results for input(s): GLUCAP in the last 168 hours. Lipid Profile: No results for input(s): CHOL, HDL, LDLCALC, TRIG, CHOLHDL, LDLDIRECT in the  last 72 hours. Thyroid Function Tests: No results for input(s): TSH, T4TOTAL, FREET4, T3FREE, THYROIDAB in the last 72 hours. Anemia Panel: No results for input(s): VITAMINB12, FOLATE, FERRITIN, TIBC, IRON, RETICCTPCT in the last 72 hours. Sepsis Labs: No results for input(s): PROCALCITON, LATICACIDVEN in the last 168 hours.  Recent Results (from the past 240 hour(s))  Culture, Urine     Status: None   Collection Time: 05/02/17 10:57 AM  Result Value Ref Range Status   Specimen Description URINE, CLEAN CATCH  Final   Special Requests NONE  Final   Culture   Final    NO GROWTH Performed at [facility], [address], [place], [place] [address]    Report Status 05/03/2017 FINAL  Final       Radiology Studies: Dg Chest Port 1 View  Result Date: 05/09/2017 CLINICAL DATA:  Shortness of Breath EXAM: PORTABLE CHEST 1 VIEW COMPARISON:  May 08, 2017 FINDINGS: There is cardiomegaly with pulmonary venous hypertension. There are  pleural effusions bilaterally. There is interstitial edema throughout the lungs. There is patchy alveolar opacity in portions of the lung bases, stable. There is no evident new opacity. There are calcified lymph nodes in the right hilar and azygos regions, stable. There is also a small calcified granuloma in the right upper lobe. IMPRESSION: Cardiomegaly with bilateral pleural effusions and pulmonary venous hypertension. There is interstitial pulmonary edema. There may be mild alveolar opacity in the lung bases as well. The overall appearance is felt to be indicative of congestive heart failure. Evidence of prior granulomatous disease. Electronically Signed   By: Lowella Grip III M.D.   On: 05/09/2017 07:11      Scheduled Meds: . acyclovir  400 mg Oral Daily  . docusate sodium  100-300 mg Oral Daily  . feeding supplement  1 Container Oral Q24H  . feeding supplement (ENSURE ENLIVE)  237 mL Oral Q24H  . magnesium oxide  400 mg Oral Daily  . midodrine   10 mg Oral TID WC  . mirtazapine  7.5 mg Oral QHS  . rivaroxaban  20 mg Oral Q supper  . senna-docusate  2 tablet Oral BID  . sodium chloride flush  3 mL Intravenous Q12H   Continuous Infusions: . sodium chloride Stopped (05/05/17 0913)     LOS: 10 days    Time spent: 30 minutes   Dessa Phi, DO [facility] [email or web address] Password [email or web address] 05/10/2017, 2:22 PM

## 2017-05-11 DIAGNOSIS — I5032 Chronic diastolic (congestive) heart failure: Secondary | ICD-10-CM

## 2017-05-11 LAB — MAGNESIUM: MAGNESIUM: 1.9 mg/dL (ref 1.7–2.4)

## 2017-05-11 LAB — BASIC METABOLIC PANEL
ANION GAP: 6 (ref 5–15)
BUN: 25 mg/dL — ABNORMAL HIGH (ref 6–20)
CHLORIDE: 103 mmol/L (ref 101–111)
CO2: 28 mmol/L (ref 22–32)
Calcium: 8.3 mg/dL — ABNORMAL LOW (ref 8.9–10.3)
Creatinine, Ser: 0.93 mg/dL (ref 0.61–1.24)
GFR calc Af Amer: >60 mL/min (ref >60)
GLUCOSE: 89 mg/dL (ref 65–99)
POTASSIUM: 4.2 mmol/L (ref 3.5–5.1)
SODIUM: 137 mmol/L (ref 135–145)

## 2017-05-11 MED ORDER — MORPHINE SULFATE (PF) 4 MG/ML IV SOLN
1.0000 mg | Freq: Once | INTRAVENOUS | Status: AC
  Administered 2017-05-11: 1 mg via INTRAVENOUS
  Filled 2017-05-11: qty 1

## 2017-05-11 NOTE — Progress Notes (Signed)
Progress Note  Patient Name: [name] Date of Encounter: 05/11/2017  Primary Cardiologist: [name], MD   Subjective   71 year old gentleman with a history of COPD, chronic hypoxic respiratory failure on home oxygen, chronic diastolic congestive heart failure, prostate cancer, depression.  He has had profound orthostatic hypotension.  He has a history of atrial fibrillation and is on Xarelto.  He is not having any significant symptoms of heart failure.  He is able to lie flat without any problems.  He has not had any leg swelling.  He complains of a sore left ankle today.  Inpatient Medications    Scheduled Meds: . acyclovir  400 mg Oral Daily  . docusate sodium  100-300 mg Oral Daily  . feeding supplement  1 Container Oral Q24H  . feeding supplement (ENSURE ENLIVE)  237 mL Oral Q24H  . magnesium oxide  400 mg Oral Daily  . midodrine  10 mg Oral TID WC  . mirtazapine  7.5 mg Oral QHS  . rivaroxaban  20 mg Oral Q supper  . senna-docusate  2 tablet Oral BID  . sodium chloride flush  3 mL Intravenous Q12H   Continuous Infusions: . sodium chloride Stopped (05/05/17 0913)   PRN Meds: sodium chloride, albuterol, magnesium hydroxide, morphine, MUSCLE RUB, ondansetron (ZOFRAN) IV, oxyCODONE-acetaminophen, polyethylene glycol, sodium chloride, sodium chloride flush   Vital Signs    Vitals:   05/11/17 0212 05/11/17 0354 05/11/17 0355 05/11/17 0511  BP: 107/74 (!) 85/58  (!) 95/57  Pulse: 66 77  67  Resp:  20    Temp:  (!) 97.5 F (36.4 C)    TempSrc:  Oral    SpO2:  91%  93%  Weight:   164 lb 0.4 oz (74.4 kg)   Height:        Intake/Output Summary (Last 24 hours) at 05/11/2017 0856 Last data filed at 05/10/2017 1858 Gross per 24 hour  Intake 640 ml  Output -  Net 640 ml   Filed Weights   05/09/17 0517 05/10/17 0650 05/11/17 0355  Weight: 165 lb 5.5 oz (75 kg) 165 lb 2 oz (74.9 kg) 164 lb 0.4 oz (74.4 kg)    Telemetry    ATrial fib  - Personally  Reviewed  ECG     atrial fib  - Personally Reviewed  Physical Exam   GEN:  Chronically ill-appearing elderly gentleman.  He is in no acute distress.  He is very deconditioned. Neck: No JVD Cardiac:  Irregularly irregular Respiratory:  Clear anteriorly GI: Soft, nontender, non-distended  MS:  Very small area of focal edema of the left ankle.  There is no edema on the right leg and no edema above the left ankle.  Does not appear to be a cardiac issue. Neuro:  Nonfocal  Psych: Normal affect   Labs    Chemistry Recent Labs  Lab 05/05/17 0413 05/06/17 0430  05/08/17 0401 05/09/17 0353 05/10/17 0416 05/11/17 0440  NA 136 137   < > 137 138 138 137  K 4.1 3.6   < > 3.4* 4.0 4.4 4.2  CL 100* 101   < > 102 103 103 103  CO2 30 31   < > _0 GLUCOSE 81 87   < > 90 89 83 89  BUN 27* 25*   < > 23* 24* 26* 25*  CREATININE 1.08 1.04   < > 0.99 0.99 1.07 0.93  CALCIUM 8.2* 8.3*   < > 8.4*  8.4* 8.3* 8.3*  PROT  --  4.6*  --   --   --   --   --   ALBUMIN 1.5* 2.2*  --  2.5*  --   --   --   AST  --  20  --   --   --   --   --   ALT  --  11*  --   --   --   --   --   ALKPHOS  --  172*  --   --   --   --   --   BILITOT  --  1.0  --   --   --   --   --   GFRNONAA >60 >60   < > >60 >60 >60 >60  GFRAA >60 >60   < > >60 >60 >60 >60  ANIONGAP 6 5   < > 4* _0 < > = values in this interval not displayed.     Hematology Recent Labs  Lab 05/07/17 0500 05/08/17 0401 05/09/17 0353  WBC 2.4* 3.0* 2.8*  RBC 2.94* 3.33* 3.18*  HGB 9.3* 10.3* 10.0*  HCT 28.4* 32.0* 30.5*  MCV 96.6 96.1 95.9  MCH 31.6 30.9 31.4  MCHC 32.7 32.2 32.8  RDW 17.6* 17.7* 17.8*  PLT 230 243 239    Cardiac EnzymesNo results for input(s): TROPONINI in the last 168 hours. No results for input(s): TROPIPOC in the last 168 hours.   BNPNo results for input(s): BNP, PROBNP in the last 168 hours.   DDimer No results for input(s): DDIMER in the last 168 hours.   Radiology    No results  found.  Cardiac Studies     Patient Profile     71 y.o. male with chronic diastolic congestive heart failure and profound orthostatic hypotension.  Assessment & Plan    Chronic diastolic congestive heart failure: The patient is doing well without Lasix.  His blood pressure is still fairly low and he still has symptoms of orthostatic hypotension.  He seems to be doing better with out the Lasix  At this point I think we should give Lasix only as needed as this will tend to worsen his orthostatic hypotension.  2.  Atrial fibrillation: His heart rate is well controlled.  Continue Xarelto  At this point he does not have any active cardiac issues.  We will sign off.  Please call for questions.  He is extremely deconditioned and will need significant rehab.   For questions or updates, please contact [place] Please consult [email or web address] for contact info under Cardiology/STEMI.      Signed, Mertie Moores, MD  05/11/2017, 8:56 AM

## 2017-05-11 NOTE — Progress Notes (Signed)
PROGRESS NOTE    [name]  JYN:[id number] DOB: 1945/06/28 DOA: 04/30/2017 PCP: Biagio Borg, MD     Brief Narrative:  [name] is a 71 year old male with history of COPD, chronic hypoxic respiratory failure on home oxygen, chronic diastolic CHF, prostate cancer, depression, anxiety, skin cancer, lymphoma on chemotherapy, A. fib on Xarelto presented with syncope. It was initially thought that patient had volume overload and was started on Lasix. Lower extremity edema improved and Lasix was discontinued because of hypotension. Patient is still very deconditioned. Cardiology was consulted for CHF and syncope.  Assessment & Plan:   Principal Problem:   Acute on chronic diastolic (congestive) heart failure (HCC) Active Problems:   Protein-calorie malnutrition, severe (HCC)   Persistent atrial fibrillation (HCC)   Malignant lymphoplasmacytic lymphoma (HCC)   Syncope and collapse   Pressure injury of skin   Malnutrition of moderate degree   Palliative care encounter  Syncope secondary to orthostatic hypotension -Noted to have SBP 35 with syncope  -Fludrocortisone was discontinued due to edema -Patient currently on TED hose, Midrin 10 mg 3 times daily -Continues to have positive orthostasis   Acute on chronic diastolic heart failure -He does have upper extremity edema, but no lower extremity edema today  -No further Lasix ordered for now, only use as needed as lasix will worsen orthostatic hypotension  -Appreciate cardiology, now signed off   Anasarca  -Secondary to heart failure, severe protein calorie malnutrition, hypoalbuminemia, nephrotic syndrome -Improving   Recent diagnosis of AL amyloidosis and nephrotic syndrome -Follow-up with nephrology as outpatient  Persistent atrial fibrillation -Rate controlled, CHADSVASc 2, continue Xarelto  Malignant lymphoplasmacytic lymphoma -On chemotherapy, followed by oncology as outpatient  Elevated troponin secondary  to demand ischemia from significant hypotension -No further evaluation  Generalized deconditioning -PT OT evaluation, recommending HHPT. Will ask to re-evaluate as patient very weak and unclear if wife will be able to provide care at home at this point. May need rehab  Goals of care -Patient has multiple medical comorbidities listed as above.  Very difficult case with poor prognosis overall as patient has several medical issues contributing to his overall decline.  Palliative care medicine consulted. Continue present treatment.    DVT prophylaxis: Xarelto Code Status: Full Family Communication: Wife at bedside  Disposition Plan: Pending further therapy evaluation. Patient very deconditioned.    Consultants:   Cardiology  Palliative care   Procedures:   None   Antimicrobials:  Anti-infectives (From admission, onward)   Start     Dose/Rate Route Frequency Ordered Stop   04/30/17 2200  doxycycline (VIBRA-TABS) tablet 100 mg     100 mg Oral 2 times daily 04/30/17 1708 05/02/17 2110   04/30/17 1800  acyclovir (ZOVIRAX) tablet 400 mg     400 mg Oral Daily 04/30/17 1708         Subjective: No new complaints. Weak.   Objective: Vitals:   05/11/17 0354 05/11/17 0355 05/11/17 0511 05/11/17 1214  BP: (!) 85/58  (!) 95/57 (!) 79/58  Pulse: 77  67 78  Resp: 20   17  Temp: (!) 97.5 F (36.4 C)   98 F (36.7 C)  TempSrc: Oral   Oral  SpO2: 91%  93% 98%  Weight:  74.4 kg (164 lb 0.4 oz)    Height:        Intake/Output Summary (Last 24 hours) at 05/11/2017 1241 Last data filed at 05/10/2017 1858 Gross per 24 hour  Intake 440 ml  Output -  Net 440 ml   Filed Weights   05/09/17 0517 05/10/17 0650 05/11/17 0355  Weight: 75 kg (165 lb 5.5 oz) 74.9 kg (165 lb 2 oz) 74.4 kg (164 lb 0.4 oz)    Examination:  General exam: Appears calm and comfortable  Respiratory system: Clear to auscultation. Respiratory effort normal. Cardiovascular system: S1 & S2 heard, Irregular  rhythm rate 60s. No JVD, murmurs, rubs, gallops or clicks. No pedal edema. +bilateral UE edema, stable  Gastrointestinal system: Abdomen is nondistended, soft and nontender. No organomegaly or masses felt. Normal bowel sounds heard. Central nervous system: Alert and oriented. No focal neurological deficits. Extremities: Symmetric Skin: No rashes, lesions or ulcers Psychiatry: Judgement and insight appear normal. Mood & affect appropriate.   Data Reviewed: I have personally reviewed following labs and imaging studies  CBC: Recent Labs  Lab 05/05/17 0413 05/06/17 0430 05/07/17 0500 05/08/17 0401 05/09/17 0353  WBC 3.3* 3.0* 2.4* 3.0* 2.8*  NEUTROABS 2.0 1.9 1.5* 1.8 1.6*  HGB 12.2* 9.8* 9.3* 10.3* 10.0*  HCT 36.5* 30.0* 28.4* 32.0* 30.5*  MCV 94.8 95.5 96.6 96.1 95.9  PLT 285 251 230 243 295   Basic Metabolic Panel: Recent Labs  Lab 05/07/17 0500 05/08/17 0401 05/09/17 0353 05/10/17 0416 05/11/17 0440  NA 138 137 138 138 137  K 3.7 3.4* 4.0 4.4 4.2  CL 103 102 103 103 103  CO2 _0 GLUCOSE 85 90 89 83 89  BUN 24* 23* 24* 26* 25*  CREATININE 0.91 0.99 0.99 1.07 0.93  CALCIUM 8.4* 8.4* 8.4* 8.3* 8.3*  MG 1.8 1.7 1.6* 1.9 1.9   GFR: Estimated Creatinine Clearance: 74.4 mL/min (by C-G formula based on SCr of 0.93 mg/dL). Liver Function Tests: Recent Labs  Lab 05/05/17 0413 05/06/17 0430 05/08/17 0401  AST  --  20  --   ALT  --  11*  --   ALKPHOS  --  172*  --   BILITOT  --  1.0  --   PROT  --  4.6*  --   ALBUMIN 1.5* 2.2* 2.5*   No results for input(s): LIPASE, AMYLASE in the last 168 hours. No results for input(s): AMMONIA in the last 168 hours. Coagulation Profile: No results for input(s): INR, PROTIME in the last 168 hours. Cardiac Enzymes: No results for input(s): CKTOTAL, CKMB, CKMBINDEX, TROPONINI in the last 168 hours. BNP (last 3 results) No results for input(s): PROBNP in the last 8760 hours. HbA1C: No results for input(s): HGBA1C in the  last 72 hours. CBG: No results for input(s): GLUCAP in the last 168 hours. Lipid Profile: No results for input(s): CHOL, HDL, LDLCALC, TRIG, CHOLHDL, LDLDIRECT in the last 72 hours. Thyroid Function Tests: No results for input(s): TSH, T4TOTAL, FREET4, T3FREE, THYROIDAB in the last 72 hours. Anemia Panel: No results for input(s): VITAMINB12, FOLATE, FERRITIN, TIBC, IRON, RETICCTPCT in the last 72 hours. Sepsis Labs: No results for input(s): PROCALCITON, LATICACIDVEN in the last 168 hours.  Recent Results (from the past 240 hour(s))  Culture, Urine     Status: None   Collection Time: 05/02/17 10:57 AM  Result Value Ref Range Status   Specimen Description URINE, CLEAN CATCH  Final   Special Requests NONE  Final   Culture   Final    NO GROWTH Performed at [facility], [address] [place], [place] [address]    Report Status 05/03/2017 FINAL  Final       Radiology Studies: No results  found.    Scheduled Meds: . acyclovir  400 mg Oral Daily  . docusate sodium  100-300 mg Oral Daily  . feeding supplement  1 Container Oral Q24H  . feeding supplement (ENSURE ENLIVE)  237 mL Oral Q24H  . magnesium oxide  400 mg Oral Daily  . midodrine  10 mg Oral TID WC  . mirtazapine  7.5 mg Oral QHS  . rivaroxaban  20 mg Oral Q supper  . senna-docusate  2 tablet Oral BID  . sodium chloride flush  3 mL Intravenous Q12H   Continuous Infusions: . sodium chloride Stopped (05/05/17 0913)     LOS: 11 days    Time spent: 30 minutes   Dessa Phi, DO [facility] [email or web address] Password [email or web address] 05/11/2017, 12:41 PM

## 2017-05-12 LAB — BASIC METABOLIC PANEL
Anion gap: 5 (ref 5–15)
BUN: 26 mg/dL — AB (ref 6–20)
CHLORIDE: 102 mmol/L (ref 101–111)
CO2: 28 mmol/L (ref 22–32)
CREATININE: 0.94 mg/dL (ref 0.61–1.24)
Calcium: 8.2 mg/dL — ABNORMAL LOW (ref 8.9–10.3)
GFR calc Af Amer: >60 mL/min (ref >60)
GFR calc non Af Amer: >60 mL/min (ref >60)
Glucose, Bld: 87 mg/dL (ref 65–99)
Potassium: 4.1 mmol/L (ref 3.5–5.1)
SODIUM: 135 mmol/L (ref 135–145)

## 2017-05-12 LAB — CBC
HCT: 33.8 % — ABNORMAL LOW (ref 39.0–52.0)
HEMOGLOBIN: 11.1 g/dL — AB (ref 13.0–17.0)
MCH: 31.5 pg (ref 26.0–34.0)
MCHC: 32.8 g/dL (ref 30.0–36.0)
MCV: 96 fL (ref 78.0–100.0)
Platelets: 244 10*3/uL (ref 150–400)
RBC: 3.52 MIL/uL — ABNORMAL LOW (ref 4.22–5.81)
RDW: 18.1 % — ABNORMAL HIGH (ref 11.5–15.5)
WBC: 2.7 10*3/uL — ABNORMAL LOW (ref 4.0–10.5)

## 2017-05-12 MED ORDER — MIDODRINE HCL 10 MG PO TABS: 10.0000 mg | ORAL_TABLET | Freq: Three times a day (TID) | ORAL | 3 refills | Status: DC

## 2017-05-12 NOTE — Care Management (Cosign Needed)
Durable Medical Equipment  (From admission, onward)        Start     Ordered   05/12/17 1404  For home use only DME Hospital bed  Once    Comments:  Severe malnutrition, syncope, persistent atrial fibrillation.  Question Answer Comment  Patient has (list medical condition): Acute on chronic diastolic Congestive heart failure   The above medical condition requires: Patient requires the ability to reposition frequently   Head must be elevated greater than: 45 degrees   Bed type Semi-electric   Hoyer Lift Yes   Trapeze Bar Yes   Support Surface: Gel Overlay      05/12/17 1408   05/02/17 1610  For home use only DME lightweight manual wheelchair with seat cushion  Once    Comments:  Patient suffers from Acute on Chronic CHF which impairs their ability to perform daily activities like Grooming, dressing in the home.  A cane or walker will not resolve  issue with performing activities of daily living. A wheelchair will allow patient to safely perform daily activities. Patient is not able to propel themselves in the home using a standard weight wheelchair due to poor balance and weakness. Patient can self propel in the lightweight wheelchair.  Accessories: elevating leg rests (ELRs), wheel locks, extensions and anti-tippers.   05/02/17 1614

## 2017-05-12 NOTE — Progress Notes (Signed)
Physical Therapy Treatment Patient Details Name: [name] MRN: [id number] DOB: 1945/08/24 Today's Date: 05/12/2017    History of Present Illness 71 year old male with history of COPD, chronic hypoxic respiratory failure on home oxygen, chronic diastolic CHF, prostate cancer, depression, anxiety, skin cancer, lymphoma on chemotherapy, cellulitis and groin area, A. fib on Xarelto presented with syncope.    PT Comments    Pt continues to be limited by BP issues/orthostatic; BP in sitting 97/75 (map 82), HR 86, standing BP after 2 mins  55/37 (map 45), HR 88, SpO2 84% on 3L O2; pt is unable to amb d/t lightheadedness and BP issues; he is able  to take a few steps to chair with min assist and RW Pt will need w/c and may also benefit from rollator with seat, HHPT has been arranged; discussed with wife, RN aware of VS and to contact MD; wife is concerned about D/C today   Follow Up Recommendations  Home health PT;Supervision/Assistance - 24 hour;Supervision for mobility/OOB     Equipment Recommendations  Wheelchair (measurements PT);Wheelchair cushion (measurements PT)    Recommendations for Other Services       Precautions / Restrictions Precautions Precautions: Fall Precaution Comments: monitor BP, sats Required Braces or Orthoses: Other Brace/Splint Other Brace/Splint: has abdominal binder, pt states it doesn't help, wife says it does Restrictions Weight Bearing Restrictions: No    Mobility  Bed Mobility Overal bed mobility: Needs Assistance Bed Mobility: Supine to Sit     Supine to sit: Min guard     General bed mobility comments: slow transition, facilitation LEs, use of rail  Transfers Overall transfer level: Needs assistance Equipment used: Rolling walker (2 wheeled) Transfers: Sit to/from [facility] Sit to Stand: Min guard;+2 safety/equipment Stand pivot transfers: Min guard;Min assist;+2 safety/equipment       General transfer comment:  min/guard for safety/BP issues; repeated x2 d/t lightheadedness  Ambulation/Gait             General Gait Details: marched in place and ambulated short distance from bed to chair but ltd by dropping BP   Stairs            Wheelchair Mobility    Modified Rankin (Stroke Patients Only)       Balance                                            Cognition Arousal/Alertness: Awake/alert Behavior During Therapy: WFL for tasks assessed/performed Overall Cognitive Status: Within Functional Limits for tasks assessed                                        Exercises General Exercises - Lower Extremity Ankle Circles/Pumps: AROM;Both;10 reps;Seated    General Comments        Pertinent Vitals/Pain Pain Assessment: No/denies pain    Home Living                      Prior Function            PT Goals (current goals can now be found in the care plan section) Acute Rehab PT Goals Patient Stated Goal: wife would like to take pt home instead of SNF. PT Goal Formulation: With patient/family Time For Goal Achievement: 05/17/17 Potential to Achieve  Goals: Good Progress towards PT goals: Progressing toward goals(slowly d/t medical issues)    Frequency    Min 3X/week      PT Plan Current plan remains appropriate    Co-evaluation              AM-PAC PT "6 Clicks" Daily Activity  Outcome Measure  Difficulty turning over in bed (including adjusting bedclothes, sheets and blankets)?: A Lot Difficulty moving from lying on back to sitting on the side of the bed? : A Lot Difficulty sitting down on and standing up from a chair with arms (e.g., wheelchair, bedside commode, etc,.)?: Unable Help needed moving to and from a bed to chair (including a wheelchair)?: A Lot Help needed walking in hospital room?: A Lot Help needed climbing 3-5 steps with a railing? : A Lot 6 Click Score: 11    End of Session Equipment Utilized  During Treatment: Gait belt;Oxygen Activity Tolerance: Treatment limited secondary to medical complications (Comment)(orthostasis) Patient left: in chair;with call bell/phone within reach;with family/visitor present Nurse Communication: Mobility status;Other (comment) PT Visit Diagnosis: Muscle weakness (generalized) (M62.81);History of falling (Z91.81)     Time: 9611-6435 PT Time Calculation (min) (ACUTE ONLY): 44 min  Charges:  $Therapeutic Activity: 38-52 mins                    G Codes:          [name] 05/17/2017, 12:43 PM

## 2017-05-12 NOTE — Discharge Summary (Signed)
Physician Discharge Summary  [name] VZD:[id number] DOB: 1946/01/17 DOA: 04/30/2017  PCP: Biagio Borg, MD  Admit date: 04/30/2017 Discharge date: 05/12/2017  Admitted From: Home Disposition:  Home  Recommendations for Outpatient Follow-up:  1. Follow up with oncologist in 1-2 weeks. 2. Follow-up with cardiology in 4 weeks. 3. Check a basic metabolic panel then. 4. Palliative care to follow-up with patient as an outpatient.  Home Health:Yes Equipment/Devices:None  Discharge Condition:stable CODE STATUS:full Diet recommendation: Heart Healthy   Brief/Interim Summary: You may copy/paste interim summary or write brief hospital course depending on length of stay  Discharge Diagnoses:  Principal Problem:   Acute on chronic diastolic (congestive) heart failure (HCC) Active Problems:   Protein-calorie malnutrition, severe (HCC)   Persistent atrial fibrillation (HCC)   Malignant lymphoplasmacytic lymphoma (HCC)   Syncope and collapse   Pressure injury of skin   Malnutrition of moderate degree   Palliative care encounter  Syncope secondary to orthostatic hypotension: Florinef was discontinued due to lower extremity edema, cardiology was consulted recommended midodrine 3 times daily and TED hose. Despite this he continues to be orthostatic.  Acute on chronic diastolic (congestive) heart failure ([place]) He was started on IV Lasix and became hypotensive. Cardiology was consulted, who recommended Lasix as needed.  Anasarca: Secondary to heart failure severe protein caloric malnutrition and nephrotic syndrome slightly improved.  Recent diagnosis of CLL and nephrotic syndrome: Follow-up with nephrology as an outpatient.  Persistent atrial fibrillation: -Rate controlled, CHADSVASc 2, continue Xarelto   Malignant lymphoplasmacytic lymphoma Continue chemotherapy as an outpatient.  Elevated troponin secondary to demand ischemia from significant hypotension: No  further evaluation.  Generalized deconditioning: PT OT evaluated the patient recommended home health PT, patient seems very weak and unclear he will be able to go home will have physical therapy reevaluate the patient.  Goals of care Patient has multiple medical comorbidities listed as above.  Very difficult case with poor prognosis overall as patient has several medical issues contributing to his overall decline.   Palliative care to follow-up as an outpatient.   Discharge Instructions  Discharge Instructions    Diet - low sodium heart healthy   Complete by:  As directed    Increase activity slowly   Complete by:  As directed      Allergies as of 05/12/2017      Reactions   Demerol [meperidine] Other (See Comments)   UNSPECIFIED REACTION  Causes system to shutdown. ?    Oxycodone Hcl Other (See Comments)   Pt states this medication 'wires him up' and makes pt hyper; pt does not want to take this ever again      Medication List    STOP taking these medications   doxycycline 100 MG capsule Commonly known as:  VIBRAMYCIN   furosemide 40 MG tablet Commonly known as:  LASIX     TAKE these medications   acyclovir 400 MG tablet Commonly known as:  ZOVIRAX Take 1 tablet (400 mg total) daily by mouth.   dexamethasone 4 MG tablet Commonly known as:  DECADRON Take 2 tablets (8 mg total) daily by mouth. Start the day after bendamustine chemotherapy for 2 days. Take with food.   diazepam 5 MG tablet Commonly known as:  VALIUM 1 by mouth prior to MRI   docusate sodium 100 MG capsule Commonly known as:  COLACE Take 1-3 capsules (100-300 mg total) by mouth daily.   feeding supplement Liqd Take 1 Container by mouth 3 (three) times daily between meals.  HYDROcodone-acetaminophen 10-325 MG tablet Commonly known as:  NORCO Take 1 tablet every 6 (six) hours as needed by mouth.   hydrocortisone cream 1 % Apply 1 application topically 4 (four) times daily as needed for  itching.   midodrine 10 MG tablet Commonly known as:  PROAMATINE Take 1 tablet (10 mg total) by mouth 3 (three) times daily with meals. What changed:    medication strength  how much to take   mirtazapine 30 MG tablet Commonly known as:  REMERON Take 1 tablet (30 mg total) at bedtime by mouth.   ondansetron 8 MG tablet Commonly known as:  ZOFRAN Take 1 tablet (8 mg total) 2 (two) times daily as needed by mouth for refractory nausea / vomiting. Start on day 2 after bendamustine chemo.   polyethylene glycol packet Commonly known as:  MIRALAX / GLYCOLAX Take 17 g by mouth daily as needed.   prochlorperazine 10 MG tablet Commonly known as:  COMPAZINE Take 1 tablet (10 mg total) every 6 (six) hours as needed by mouth (Nausea or vomiting).   rivaroxaban 20 MG Tabs tablet Commonly known as:  XARELTO Take 1 tablet (20 mg total) by mouth daily with supper.   senna-docusate 8.6-50 MG tablet Commonly known as:  SENNA S Take 2 tablets by mouth 2 (two) times daily. Reduce to 2 tab po HS once constipation resolved            Durable Medical Equipment  (From admission, onward)        Start     Ordered   05/02/17 1610  For home use only DME lightweight manual wheelchair with seat cushion  Once    Comments:  Patient suffers from Acute on Chronic CHF which impairs their ability to perform daily activities like Grooming, dressing in the home.  A cane or walker will not resolve  issue with performing activities of daily living. A wheelchair will allow patient to safely perform daily activities. Patient is not able to propel themselves in the home using a standard weight wheelchair due to poor balance and weakness. Patient can self propel in the lightweight wheelchair.  Accessories: elevating leg rests (ELRs), wheel locks, extensions and anti-tippers.   05/02/17 1614      Allergies  Allergen Reactions  . Demerol [Meperidine] Other (See Comments)    UNSPECIFIED REACTION  Causes  system to shutdown. ?   . Oxycodone Hcl Other (See Comments)    Pt states this medication 'wires him up' and makes pt hyper; pt does not want to take this ever again    Consultations:  Cardiology.   Procedures/Studies: Dg Chest 2 View  Result Date: 04/30/2017 CLINICAL DATA:  Back pain, body aches, near syncopal and syncopal episodes, hypotension. Patient has lymphoma and underwent second chemotherapy treatment last week but did not completed. Peripheral edema. History of CHF. EXAM: CHEST  2 VIEW COMPARISON:  Chest x-ray dated February 25, 2017 FINDINGS: The lungs are adequately inflated. There is a moderate-sized right pleural effusion which has increased slightly since the previous study. There is a tiny pleural effusion layering posteriorly. The cardiac silhouette is enlarged. The pulmonary vascularity is engorged. The interstitial markings are increased. The mediastinum is normal in width. IMPRESSION: Findings compatible with congestive heart failure with interstitial edema and a moderate size right pleural effusion. Thoracic aortic atherosclerosis. Electronically Signed   By: David  Martinique M.D.   On: 04/30/2017 14:44   Mr Jeri Cos KA Contrast  Result Date: 04/30/2017 CLINICAL DATA:  Syncopal  episode. On chemotherapy for lymphoma. Assess for stroke or metastasis. History of headache, prostate cancer, tobacco abuse. EXAM: MRI HEAD WITHOUT AND WITH CONTRAST TECHNIQUE: Multiplanar, multiecho pulse sequences of the brain and surrounding structures were obtained without and with intravenous contrast. CONTRAST:  41m MULTIHANCE GADOBENATE DIMEGLUMINE 529 MG/ML IV SOLN COMPARISON:  None. FINDINGS: Multiple sequences are moderately motion degraded. INTRACRANIAL CONTENTS: No reduced diffusion to suggest acute ischemia or hypercellular tumor. No susceptibility artifact to suggest hemorrhage. The ventricles and sulci are normal for patient's age. A few scattered subcentimeter supratentorial white matter  FLAIR T2 hyperintensities compatible with mild chronic small vessel ischemic disease, less than expected for age. 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 No suspicious parenchymal signal, masses, mass effect. No abnormal intraparenchymal or extra-axial enhancement. No abnormal extra-axial fluid collections. No extra-axial masses. VASCULAR: Normal major intracranial vascular flow voids present at skull base. SKULL AND UPPER CERVICAL SPINE: Abnormally decreased signal RIGHT lateral mass and C1-2 articulation with enhancing effusion and arthropathy. No abnormal sellar expansion. No suspicious calvarial bone marrow signal. Craniocervical junction maintained. Small front and enhancing scalp nodule, possible subacute hematoma SINUSES/ORBITS: Mild paranasal sinus mucosal thickening with bilateral maxillary mucosal retention cyst. The included ocular globes and orbital contents are non-suspicious. OTHER: None. IMPRESSION: 1. No acute intracranial process or intracranial metastasis on this motion degraded examination. 2. Old small cerebellar infarcts, otherwise negative MRI of the head for age. 3. RIGHT C1-2 abnormal signal, arthropathy and enhancing effusion, likely reactive. Recommend dedicated CT cervical spine to evaluate osseous changes. Electronically Signed   By: CElon AlasM.D.   On: 04/30/2017 20:57   Ct Abdomen Pelvis W Contrast  Result Date: 04/25/2017 CLINICAL DATA:  Worsening abdominal pain and swelling. Currently undergoing chemotherapy for lymphoma. EXAM: CT ABDOMEN AND PELVIS WITH CONTRAST TECHNIQUE: Multidetector CT imaging of the abdomen and pelvis was performed using the standard protocol following bolus administration of intravenous contrast. CONTRAST:  1048mISOVUE-300 IOPAMIDOL (ISOVUE-300) INJECTION 61% COMPARISON:  PET-CT 04/04/2017 FINDINGS: Lower Chest: Mild bilateral pleural effusions and compressive atelectasis, without significant change Hepatobiliary: No hepatic masses identified.  Stable small cyst in inferior right hepatic lobe. Probable layering sludge again seen within the gallbladder, however there is no evidence of cholecystitis. Pancreas:  No mass or inflammatory changes. Spleen: Within normal limits in size and appearance. Adrenals/Urinary Tract: No masses identified. Stable small cysts in lower pole of right kidney. No evidence of hydronephrosis. Unremarkable unopacified urinary bladder. Stomach/Bowel: No evidence of obstruction, inflammatory process or abnormal fluid collections. Vascular/Lymphatic: Aortic atherosclerosis. No abdominal aortic aneurysm. Mild retroperitoneal lymphadenopathy shows no significant change, with index lymph node measuring 15 mm in the aortocaval space on image 32/2. Evaluation is limited by diffuse soft tissue edema. No new or increased areas of lymphadenopathy identified. Reproductive: Stable mildly enlarged prostate gland with TURP defect. Other: Diffuse mesenteric and body wall edema is again seen. Minimal ascites again noted. Musculoskeletal:  No suspicious bone lesions identified. IMPRESSION: No acute findings. No significant change in bilateral pleural effusions, diffuse mesenteric and body wall edema, and minimal ascites, consistent with third spacing. No significant change in mild abdominal retroperitoneal lymphadenopathy. Probable layering gallbladder sludge, without radiographic signs of cholecystitis. Aortic atherosclerosis. Electronically Signed   By: JoEarle Gell.D.   On: 04/25/2017 16:58   Dg Chest Port 1 View  Result Date: 05/09/2017 CLINICAL DATA:  Shortness of Breath EXAM: PORTABLE CHEST 1 VIEW COMPARISON:  May 08, 2017 FINDINGS: There is cardiomegaly with pulmonary venous hypertension. There are pleural effusions bilaterally.  There is interstitial edema throughout the lungs. There is patchy alveolar opacity in portions of the lung bases, stable. There is no evident new opacity. There are calcified lymph nodes in the right hilar  and azygos regions, stable. There is also a small calcified granuloma in the right upper lobe. IMPRESSION: Cardiomegaly with bilateral pleural effusions and pulmonary venous hypertension. There is interstitial pulmonary edema. There may be mild alveolar opacity in the lung bases as well. The overall appearance is felt to be indicative of congestive heart failure. Evidence of prior granulomatous disease. Electronically Signed   By: Lowella Grip III M.D.   On: 05/09/2017 07:11   Dg Chest Port 1 View  Result Date: 05/08/2017 CLINICAL DATA:  72 year old male with a history of dyspnea EXAM: PORTABLE CHEST 1 VIEW COMPARISON:  05/06/2017, 04/30/2017 FINDINGS: Cardiomediastinal silhouette unchanged, partially obscured by overlying lung and pleural disease. Persisting bilateral interstitial opacities and interlobular septal thickening with failed opacities at the bilateral lung bases. pleuroparenchymal thickening bilaterally persists. Calcified lymph nodes of the mediastinum again visualized. No displaced fracture. IMPRESSION: Similar appearance of the chest x-ray, with persisting diffuse mixed interstitial and airspace disease, and bilateral pleural effusions, with partial loculation on the right. Electronically Signed   By: Corrie Mckusick D.O.   On: 05/08/2017 07:43   Dg Chest Port 1 View  Result Date: 05/06/2017 CLINICAL DATA:  Shortness of Breath EXAM: PORTABLE CHEST 1 VIEW COMPARISON:  04/30/2017 FINDINGS: Severe diffuse bilateral airspace disease, worsening since prior study. Moderate to large right effusion appears loculated and increased since prior study. Cardiomegaly, stable. IMPRESSION: Severe diffuse bilateral airspace disease, worsening since prior study, edema or infection. Increasing loculated right pleural effusion. Electronically Signed   By: Rolm Baptise M.D.   On: 05/06/2017 07:14    Subjective: No complaints.  Discharge Exam: Vitals:   05/12/17 0112 05/12/17 0427  BP: 97/63 108/71   Pulse: 74 60  Resp:  20  Temp:  97.7 F (36.5 C)  SpO2:  95%   Vitals:   05/11/17 2014 05/12/17 0112 05/12/17 0426 05/12/17 0427  BP: (!) 93/59 97/63  108/71  Pulse: 69 74  60  Resp: 18   20  Temp: 98.1 F (36.7 C)   97.7 F (36.5 C)  TempSrc: Oral   Oral  SpO2: 95%   95%  Weight:   74.3 kg (163 lb 12.8 oz)   Height:        General: Pt is alert, awake, not in acute distress Cardiovascular: RRR, S1/S2 +, no rubs, no gallops Respiratory: CTA bilaterally, no wheezing, no rhonchi Abdominal: Soft, NT, ND, bowel sounds + Extremities: no edema, no cyanosis    The results of significant diagnostics from this hospitalization (including imaging, microbiology, ancillary and laboratory) are listed below for reference.     Microbiology: Recent Results (from the past 240 hour(s))  Culture, Urine     Status: None   Collection Time: 05/02/17 10:57 AM  Result Value Ref Range Status   Specimen Description URINE, CLEAN CATCH  Final   Special Requests NONE  Final   Culture   Final    NO GROWTH Performed at [facility], [address] [place], [place] [address]    Report Status 05/03/2017 FINAL  Final     Labs: BNP (last 3 results) Recent Labs    11/22/16 0318 02/21/17 1637 04/30/17 1327  BNP 678.9* 626.5* 915.0*   Basic Metabolic Panel: Recent Labs  Lab 05/07/17 0500 05/08/17 0401 05/09/17 0353  05/10/17 0416 05/11/17 0440 05/12/17 0436  NA 138 137 138 138 137 135  K 3.7 3.4* 4.0 4.4 4.2 4.1  CL 103 102 103 103 103 102  CO2 _0 GLUCOSE 85 90 89 83 89 87  BUN 24* 23* 24* 26* 25* 26*  CREATININE 0.91 0.99 0.99 1.07 0.93 0.94  CALCIUM 8.4* 8.4* 8.4* 8.3* 8.3* 8.2*  MG 1.8 1.7 1.6* 1.9 1.9  --    Liver Function Tests: Recent Labs  Lab 05/06/17 0430 05/08/17 0401  AST 20  --   ALT 11*  --   ALKPHOS 172*  --   BILITOT 1.0  --   PROT 4.6*  --   ALBUMIN 2.2* 2.5*   No results for input(s): LIPASE, AMYLASE in the last 168 hours. No  results for input(s): AMMONIA in the last 168 hours. CBC: Recent Labs  Lab 05/06/17 0430 05/07/17 0500 05/08/17 0401 05/09/17 0353 05/12/17 0436  WBC 3.0* 2.4* 3.0* 2.8* 2.7*  NEUTROABS 1.9 1.5* 1.8 1.6*  --   HGB 9.8* 9.3* 10.3* 10.0* 11.1*  HCT 30.0* 28.4* 32.0* 30.5* 33.8*  MCV 95.5 96.6 96.1 95.9 96.0  PLT 251 230 243 239 244   Cardiac Enzymes: No results for input(s): CKTOTAL, CKMB, CKMBINDEX, TROPONINI in the last 168 hours. BNP: Invalid input(s): POCBNP CBG: No results for input(s): GLUCAP in the last 168 hours. D-Dimer No results for input(s): DDIMER in the last 72 hours. Hgb A1c No results for input(s): HGBA1C in the last 72 hours. Lipid Profile No results for input(s): CHOL, HDL, LDLCALC, TRIG, CHOLHDL, LDLDIRECT in the last 72 hours. Thyroid function studies No results for input(s): TSH, T4TOTAL, T3FREE, THYROIDAB in the last 72 hours.  Invalid input(s): FREET3 Anemia work up No results for input(s): VITAMINB12, FOLATE, FERRITIN, TIBC, IRON, RETICCTPCT in the last 72 hours. Urinalysis    Component Value Date/Time   COLORURINE YELLOW 05/01/2017 1450   APPEARANCEUR CLOUDY (A) 05/01/2017 1450   LABSPEC 1.020 05/01/2017 1450   PHURINE 6.0 05/01/2017 1450   GLUCOSEU NEGATIVE 05/01/2017 1450   HGBUR LARGE (A) 05/01/2017 1450   BILIRUBINUR NEGATIVE 05/01/2017 1450   KETONESUR NEGATIVE 05/01/2017 1450   PROTEINUR 100 (A) 05/01/2017 1450   NITRITE NEGATIVE 05/01/2017 1450   LEUKOCYTESUR NEGATIVE 05/01/2017 1450   Sepsis Labs Invalid input(s): PROCALCITONIN,  WBC,  LACTICIDVEN Microbiology Recent Results (from the past 240 hour(s))  Culture, Urine     Status: None   Collection Time: 05/02/17 10:57 AM  Result Value Ref Range Status   Specimen Description URINE, CLEAN CATCH  Final   Special Requests NONE  Final   Culture   Final    NO GROWTH Performed at [facility], [place] [address] [place], [place] [address]    Report Status 05/03/2017 FINAL  Final      Time coordinating discharge: Over 30 minutes  SIGNED:   Charlynne Cousins, MD  [facility] 05/12/2017, 10:52 AM Pager   If 7PM-7AM, please contact night-coverage [email or web address] Password [email or web address]

## 2017-05-12 NOTE — Progress Notes (Signed)
Patient and family given discharge, follow up, and medications instructions, verbalized understanding, IV and telemetry monitor removed, DC paperwork with family, PTAR to transport patient home

## 2017-05-14 ENCOUNTER — Telehealth: Payer: Self-pay | Admitting: Internal Medicine

## 2017-05-14 DIAGNOSIS — I959 Hypotension, unspecified: Secondary | ICD-10-CM

## 2017-05-14 DIAGNOSIS — I5030 Unspecified diastolic (congestive) heart failure: Secondary | ICD-10-CM

## 2017-05-14 DIAGNOSIS — C9110 Chronic lymphocytic leukemia of B-cell type not having achieved remission: Secondary | ICD-10-CM

## 2017-05-14 DIAGNOSIS — R601 Generalized edema: Secondary | ICD-10-CM

## 2017-05-14 DIAGNOSIS — R55 Syncope and collapse: Secondary | ICD-10-CM

## 2017-05-14 DIAGNOSIS — I4891 Unspecified atrial fibrillation: Secondary | ICD-10-CM

## 2017-05-14 MED ORDER — NYSTATIN 100000 UNIT/GM EX CREA: 1.0000 "application " | TOPICAL_CREAM | Freq: Two times a day (BID) | CUTANEOUS | 0 refills | Status: DC

## 2017-05-14 MED ORDER — HYDROCODONE-ACETAMINOPHEN 10-325 MG PO TABS: 1.0000 | ORAL_TABLET | Freq: Four times a day (QID) | ORAL | 0 refills | Status: DC | PRN

## 2017-05-14 NOTE — Telephone Encounter (Signed)
Called pt wife she states husband was d/c from hospital on Mon 05/12/17, and he has a place on his buttock area looks kind of like a sunburn. When he was in the hospital they put a catheter in and it kept leaking, and it has irritated his crotch area. Requesting rx for nystatin cream to put on area.  Inform wife pt need to have a hosp f/u appt, but she states he is not able to stand. He is home bond was not able to get up while being in the hospital for 12 days. She also states he is needing refill on his Hydrocodone, and orders for home health, OT, and nurses aid...[name]

## 2017-05-14 NOTE — Telephone Encounter (Signed)
Copied from [place] [id number]. Topic: Inquiry >> May 14, 2017 11:28 AM Malena Catholic I, NT wrote: Reason for CRM:  pt wife call and said his bottom is sore and she need the CMA to call the pharmacy for something for it  her num is [phone]

## 2017-05-14 NOTE — Telephone Encounter (Signed)
Ok for nystatin, pain medication refills done erx  OK for HH with PT, OT and aide

## 2017-05-14 NOTE — Progress Notes (Signed)
HEMATOLOGY/ONCOLOGY CLINIC NOTE   Date of Service: ..04/24/2017   Patient Care Team:  Nche, Charlene Brooke, NP as PCP - General (Internal Medicine)  Donita Brooks, MD as Attending Physician (Internal Medicine)  Mack Guise. Lacinda Axon, MD as Consulting Physician (Urology)   CHIEF COMPLAINTS/PURPOSE OF CONSULTATION:  AL Amyloidosis  Malignant lymphoplasmacytic lymphoma   HISTORY OF PRESENTING ILLNESS:  [name] is a wonderful 72 y.o. 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  Patient follows with Dr Alen Blew for his h/o prostate cancer and was found to have borderline lymphadenopathy dating back to January 2018. He had a mediastinal adenopathy that was borderline detected on a CT scan. A repeat CT scan on 05/17/2016 showed persistent enlarged mediastinal lymph nodes and a left internal mammary lymph node. Differential considerations include lymphoproliferative disorder, granulomatous inflammation/infection or reactive adenopathy. left cervical LN Bx in 10/2016 was unrevealing.  Patient has multiple co-morbidities including PAF on Xarelto status post DCCV 01/12/41, diastolic CHF with LVEF 68-34% on echo 05/14/16, COPD with pulmonary hypertension on home O2 at night time s/p right VATS 05/20/16 for empyema. He was admitted with  Worsening fatigue and generalized swelling.  He was noted to have hypoalbuminemia and further w/u demonstrated nephrotic range proteinuria of about 6g per day. Patient's nephrology w/u showed monoclonal paraproteinemia with M spike of 1.1g/dl. He subsequently had a kidney biopsy on 02/27/2017 - which per report --prelim showed AL Amyoidosis. Final pathology pending.  Patient is being diuresed aggresivel.  ECHO 10/13 showed EF of 19-62% Diastolic function could not be evaluated due to Atrial fibrillation.  I discussed the available results in details with the patient and his  wife at bedside.  He has no focal bone pain, Has some anemia. No overt hypercalcemia at this time.   INTERVAL HISTORY:  [name] is here for fu of his LPL and Renal AL Amyloidosis lambda light chain prior to his 2nd cycle of BR. No overt prohibitive toxicity from the Bendamustine/Rituxan treatment at this time. No fevers/chills/nightsweats. Grade 2 Fatigue. Continued chronic issues with back pain. Is following with orthopedics. Pain mx contract with PCP. Has continued to have low BP and has been on midodrine as per his cardiologist and has needed IVF at times. On review of systems, pt reports constipation, severe back pain and denies diarrhea, vomiting, nausea, fever, chills, and any other accompanying symptoms.     MEDICAL HISTORY:      Past Medical History:  Diagnosis Date  . Anxiety   . Arthritis    "neck" (08/15/2015)  . Chronic bronchitis ([place])   . Chronic diastolic CHF (congestive heart failure) ([place])   . Constipation   . COPD (chronic obstructive pulmonary disease) ([place])   . DDD (degenerative disc disease), cervical   . Depression   . Dilated aortic root ([place])    63m on echo 05/2016  . Edema of left lower extremity   . Facial basal cell cancer   . H/O acne vulgaris 1960s   [facility]  . Headache    history of - left temporal- years ago- not current (08/15/2015)  . Persistent atrial fibrillation (HCC)    on coumadin with CHADS2VASC score of 2  . Pneumonia 1960s; 2015 X 2  . [place]   "low spreading; non aggressive type" (08/15/2015)  . Pulmonary  HTN ([place])    PASP 59mHg by echo 05/2016  . Skin cancer    "back"   SURGICAL HISTORY:       Past Surgical History:  Procedure Laterality Date  . CARDIOVERSION N/A 10/11/2016   Procedure: CARDIOVERSION; Surgeon: CSanda Klein MD; [facility]; Service: Cardiovascular; Laterality: N/A;  . CARDIOVERSION N/A 02/26/2017   Procedure:  CARDIOVERSION; Surgeon: MLarey Dresser MD; [facility]; Service: Cardiovascular; Laterality: N/A;  . COLONOSCOPY    . EXCISIONAL HEMORRHOIDECTOMY  1960s  . INGUINAL HERNIA REPAIR Right 2002  . INGUINAL HERNIA REPAIR Bilateral 08/15/2015  . INGUINAL HERNIA REPAIR Bilateral 08/15/2015   Procedure: OPEN REPAIR RECURRENT RIGHT INGUINAL HERNIA WITH MESH AND REPAIR LEFT INGUINAL HERNIA WITH MESH; Surgeon: HFanny Skates MD; [facility] Service: General; Laterality: Bilateral;  . INSERTION OF MESH Bilateral 08/15/2015   Procedure: INSERTION OF MESH; Surgeon: HFanny Skates MD; [facility]: [facility] Service: General; Laterality: Bilateral;  . IR THORACENTESIS ASP PLEURAL SPACE W/IMG GUIDE  11/18/2016  . LYMPH NODE BIOPSY Bilateral 10/22/2016   Procedure: LEFT NECK LYMPH NODE BIOPSY; Surgeon: VIvin Poot MD; [facility] Service: Thoracic; Laterality: Bilateral;  . PROSTATE BIOPSY    . TONSILLECTOMY  1950s  . VIDEO ASSISTED THORACOSCOPY (VATS)/EMPYEMA Right 05/20/2016   Procedure: VIDEO ASSISTED THORACOSCOPY (VATS) with drainage of pleural effusion; Surgeon: PIvin Poot MD; Location: [facility] Service: Thoracic; Laterality: Right;  .Marland KitchenVIDEO BRONCHOSCOPY WITH ENDOBRONCHIAL ULTRASOUND Right 05/20/2016   Procedure: VIDEO BRONCHOSCOPY WITH ENDOBRONCHIAL ULTRASOUND; Surgeon: PIvin Poot MD; [place] Service: Thoracic; Laterality: Right;   SOCIAL HISTORY:  Social History        Social History  . Marital status: Married    Spouse name: N/A  . Number of children: N/A  . Years of education: N/A      Occupational History  . Not on file.         Social History Main Topics  . Smoking status: Former Smoker    Packs/day: 1.00    Years: 62.00    Types: Cigarettes    Quit date: 05/14/2016  . Smokeless tobacco: Never Used  . Alcohol use No  . Drug use: No     Comment: CBD OIL  . Sexual activity: Not Currently    Partners: Female       Other Topics Concern  . Not on  file      Social History Narrative  . No narrative on file   FAMILY HISTORY:       Family History  Problem Relation Age of Onset  . Colon cancer Mother   . Ovarian cancer Mother   . Alcoholism Father   . CAD Father   . Alcohol abuse Father   . Ovarian cancer Sister   . Diabetes Neg Hx   . Stomach cancer Neg Hx    ALLERGIES: is allergic to demerol [meperidine] and oxycodone hcl.  MEDICATIONS:   . Current Outpatient Medications:  .  acyclovir (ZOVIRAX) 400 MG tablet, Take 1 tablet (400 mg total) daily by mouth., Disp: 30 tablet, Rfl: 3 .  dexamethasone (DECADRON) 4 MG tablet, Take 2 tablets (8 mg total) daily by mouth. Start the day after bendamustine chemotherapy for 2 days. Take with food. (Patient not taking: Reported on 04/30/2017), Disp: 30 tablet, Rfl: 1 .  diazepam (VALIUM) 5 MG tablet, 1 by mouth prior to MRI (Patient not taking: Reported on 04/25/2017), Disp: 1 tablet, Rfl: 0 .  docusate sodium (COLACE) 100  MG capsule, Take 1-3 capsules (100-300 mg total) by mouth daily., Disp: 90 capsule, Rfl: 5 .  feeding supplement (BOOST / RESOURCE BREEZE) LIQD, Take 1 Container by mouth 3 (three) times daily between meals., Disp: 5 Container, Rfl: 0 .  HYDROcodone-acetaminophen (NORCO) 10-325 MG tablet, Take 1 tablet every 6 (six) hours as needed by mouth., Disp: 120 tablet, Rfl: 0 .  hydrocortisone cream 1 %, Apply 1 application topically 4 (four) times daily as needed for itching. (Patient not taking: Reported on 04/25/2017), Disp: 30 g, Rfl: 0 .  mirtazapine (REMERON) 30 MG tablet, Take 1 tablet (30 mg total) at bedtime by mouth. (Patient not taking: Reported on 04/25/2017), Disp: 90 tablet, Rfl: 3 .  ondansetron (ZOFRAN) 8 MG tablet, Take 1 tablet (8 mg total) 2 (two) times daily as needed by mouth for refractory nausea / vomiting. Start on day 2 after bendamustine chemo. (Patient not taking: Reported on 04/25/2017), Disp: 30 tablet, Rfl: 1 .  polyethylene glycol (MIRALAX / GLYCOLAX)  packet, Take 17 g by mouth daily as needed. (Patient not taking: Reported on 04/25/2017), Disp: 14 each, Rfl: 0 .  prochlorperazine (COMPAZINE) 10 MG tablet, Take 1 tablet (10 mg total) every 6 (six) hours as needed by mouth (Nausea or vomiting). (Patient not taking: Reported on 04/25/2017), Disp: 30 tablet, Rfl: 1 .  rivaroxaban (XARELTO) 20 MG TABS tablet, Take 1 tablet (20 mg total) by mouth daily with supper., Disp: 30 tablet, Rfl: 6 .  senna-docusate (SENNA S) 8.6-50 MG tablet, Take 2 tablets by mouth 2 (two) times daily. Reduce to 2 tab po HS once constipation resolved, Disp: 60 tablet, Rfl: 1 .  midodrine (PROAMATINE) 10 MG tablet, Take 1 tablet (10 mg total) by mouth 3 (three) times daily with meals., Disp: 90 tablet, Rfl: 3  REVIEW OF SYSTEMS:  10 Point review of Systems was done is negative except as noted above.   PHYSICAL EXAMINATION:  ECOG PERFORMANCE STATUS: 2 - Symptomatic, <50% confined to bed  . Marland KitchenBP (!) 90/49 (BP Location: Right Arm, Patient Position: Sitting)   Pulse 82   Temp 97.7 F (36.5 C) (Oral)   Resp 20   Wt 163 lb (73.9 kg)   SpO2 93%   BMI 22.73 kg/m    .Body mass index is 21.23 kg/m.  GENERAL:alert, in no acute distress and comfortable  SKIN: no acute rashes, no significant lesions  EYES: conjunctiva are pink and non-injected, sclera anicteric  OROPHARYNX: MMM, no exudates, no oropharyngeal erythema or ulceration  NECK: supple, no JVD  LYMPH: no palpable lymphadenopathy in the cervical, axillary or inguinal regions  LUNGS: clear to auscultation b/l with normal respiratory effort  HEART: regular rate & rhythm  ABDOMEN: normoactive bowel sounds , non tender, not distended.  Extremity: 2+pitting pedal edema  PSYCH: alert & oriented x 3 with fluent speech  NEURO: no focal motor/sensory deficits   LABORATORY DATA:  I have reviewed the data as listed   Component     Latest Ref Rng & Units 04/24/2017  WBC     4.0 - 10.3 10e3/uL 5.4  NEUT#     1.5 -  6.5 10e3/uL 4.1  Hemoglobin     13.0 - 17.1 g/dL 11.0 (L)  HCT     38.4 - 49.9 % 34.9 (L)  Platelets     140 - 400 10e3/uL 273  MCV     79.3 - 98.0 fL 95.6  MCH     27.2 - 33.4 pg 30.1  MCHC     32.0 - 36.0 g/dL 31.5 (L)  RBC     4.20 - 5.82 10e6/uL 3.65 (L)  RDW     11.0 - 14.6 % 16.2 (H)  lymph#     0.9 - 3.3 10e3/uL 0.5 (L)  MONO#     0.1 - 0.9 10e3/uL 0.5  Eosinophils Absolute     0.0 - 0.5 10e3/uL 0.2  Basophils Absolute     0.0 - 0.1 10e3/uL 0.1  NEUT%     39.0 - 75.0 % 76.6 (H)  LYMPH%     14.0 - 49.0 % 9.3 (L)  MONO%     0.0 - 14.0 % 8.7  EOS%     0.0 - 7.0 % 4.3  BASO%     0.0 - 2.0 % 1.1  Retic %     0.80 - 1.80 % 1.24  Retic Ct Abs     34.80 - 93.90 10e3/uL 45.26  Immature Retic Fract     3.00 - 10.60 % 1.70 (L)  Sodium     136 - 145 mEq/L 136  Potassium     3.5 - 5.1 mEq/L 4.0  Chloride     98 - 109 mEq/L 101  CO2     22 - 29 mEq/L 27  Glucose     70 - 140 mg/dl 94  BUN     7.0 - 26.0 mg/dL 27.9 (H)  Creatinine     0.7 - 1.3 mg/dL 1.0  Total Bilirubin     0.20 - 1.20 mg/dL 0.42  Alkaline Phosphatase     40 - 150 U/L 341 (H)  AST     5 - 34 U/L 19  ALT     0 - 55 U/L 14  Total Protein     6.4 - 8.3 g/dL 5.2 (L)  Albumin     3.5 - 5.0 g/dL 1.5 (L)  Calcium     8.4 - 10.4 mg/dL 8.3 (L)  Anion gap     3 - 11 mEq/L 8  EGFR     >60 ml/min/1.73 m2 >60  Phosphorus     2.5 - 4.5 mg/dL 3.3  Magnesium     1.5 - 2.5 mg/dl 1.6    Component     Latest Ref Rng & Units 03/01/2017  IgG (Immunoglobin G), Serum     700 - 1,600 mg/dL 476 (L)  IgA     61 - 437 mg/dL 72  IgM (Immunoglobulin M), Srm     15 - 143 mg/dL 1,757 (H)  Total Protein ELP     6.0 - 8.5 g/dL 4.9 (L)  Albumin SerPl Elph-Mcnc     2.9 - 4.4 g/dL 1.6 (L)  Alpha 1     0.0 - 0.4 g/dL 0.3  Alpha2 Glob SerPl Elph-Mcnc     0.4 - 1.0 g/dL 0.8  B-Globulin SerPl Elph-Mcnc     0.7 - 1.3 g/dL 0.7  Gamma Glob SerPl Elph-Mcnc     0.4 - 1.8 g/dL 1.5  M Protein SerPl  Elph-Mcnc     Not Observed g/dL 1.2 (H)  Globulin, Total     2.2 - 3.9 g/dL 3.3  Albumin/Glob SerPl     0.7 - 1.7 0.5 (L)  IFE 1      Comment  Please Note (HCV):      Comment  Kappa free light chain     3.3 - 19.4 mg/L 168.2 (H)  Lamda free light chains  5.7 - 26.3 mg/L 134.6 (H)  Kappa, lamda light chain ratio     0.26 - 1.65 1.25  LDH     98 - 192 U/L 148  Beta-2 Microglobulin     0.6 - 2.4 mg/L 4.0 (H)                 *[place]*  *[facility]*  [address], [place] [address]  [phone]  -------------------------------------------------------------------  Transthoracic Echocardiography  Patient: [name], [name]  MR #: [id number]  Study Date: 02/22/2017  Gender: M  Age: 23  Height: 180.3 cm  Weight: 73 kg  BSA: 1.91 m^2  Pt. Status:  Room: [place], [place], [place]  Gillis Santa [id number]  Esau Grew [id number]  PERFORMING [facility], Inpatient  SONOGRAPHER Jannett Celestine, RDCS  cc:  -------------------------------------------------------------------  LV EF: 60% - 65%  -------------------------------------------------------------------  Indications: CHF - 428.0.  -------------------------------------------------------------------  History: PMH: Pulmonary Hypertension  Atrial fibrillation. Congestive heart failure. Chronic  obstructive pulmonary disease. Risk factors: Former tobacco use.  -------------------------------------------------------------------  Study Conclusions  - Left ventricle: The cavity size was normal. There was moderate  concentric hypertrophy. Systolic function was normal. The  estimated ejection fraction was in the range of 60% to 65%. Wall  motion was normal; there were no regional wall motion  abnormalities. The study was not technically sufficient to allow  evaluation of LV diastolic dysfunction due to atrial    fibrillation.  - Aortic valve: Trileaflet; normal thickness, mildly calcified  leaflets. There was trivial regurgitation.  - Mitral valve: Calcified annulus. There was mild regurgitation.  - Left atrium: The atrium was mildly dilated.  - Right ventricle: The cavity size was moderately dilated. Wall  thickness was normal.  - Right atrium: The atrium was massively dilated.  - Tricuspid valve: There was moderate regurgitation.  - Pulmonic valve: There was trivial regurgitation.  - Pulmonary arteries: PA peak pressure: 51 mm Hg (S).  RADIOGRAPHIC STUDIES:  I have personally reviewed the radiological images as listed and agreed with the findings in the report.   Imaging Results    Whole body skeletal survey 03/02/2017  IMPRESSION: 1. No aggressive lytic or sclerotic bone lesion of the axial and appendicular skeleton.   Electronically Signed   By: Kathreen Devoid   On: 03/02/2017 12:47    ASSESSMENT & PLAN:   72 yo caucasian male with multiple medical co-morbidities with   1) Systemic AL Amyloidosis - lambda light chain based associated with newly diagnosed Lymphoplasmacytic Lymphoma ([place])  No overt evidence of cardiac involvement based on ECHO   IgM lambda monoclonal paraproteinemia. 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 24h UPEP with 6.22g  Proteinuria daily.  3) Chronic Low BP -- on midodrine. ? Autonomic dysfunction . Low albumin levels. PLAN  -labs reviewed - stable -no prohibitve toxicity from C1 of BR treatment. -appropriate to proceed with C2 f BR at this time -will need close f/u with PCP/nephrology/cardiology for continue mx of diuretics in the setting of soft BP and needing the use of midodrine. -PET/CTrevewed with patient -F/u with pcp for pain management of chronic back pain - could consider orthopedic evaluation if needed.  3.)Back pain - Defer pain management and  further evaluation to PCP. -MR thoracic spine was ordered by Dr. Cathlean Cower on 03/26/17 for this    4.) SEVERE  protein calorie malnutrition  -has been referred to nutritional therapy for this   Continue Bendamustine/Rituxan with neulasta as ordered RTC with Dr Irene Limbo in 4 weeks with C3D1 with labs  All of the patients questions were answered with apparent satisfaction. The patient knows to call the clinic with any problems, questions or concerns.    I spent 20 minutes counseling the patient face to face. The total time spent in the appointment was 20 minutes and more than 50% was on counseling and direct patient cares.   Sullivan Lone MD [place] AAHIVMS [facility]  Hematology/Oncology Physician  [facility]  (Office): [phone]  (Work cell): [phone]  (Fax): [phone]

## 2017-05-14 NOTE — Telephone Encounter (Signed)
Copied from [place] [id number]. Topic: Quick Communication - See Telephone Encounter >> May 14, 2017  9:56 AM Robina Ade, Helene Kelp D wrote: CRM for notification. See Telephone encounter for: 05/14/17. Patient wife called and would like to talk to [place] about patient, please call patient back, thanks.

## 2017-05-14 NOTE — Telephone Encounter (Signed)
Notified pt wife w/MD response..wife states will call back for hosp[ f/u appt soon as husband is able to get around....[name]

## 2017-05-14 NOTE — Telephone Encounter (Signed)
Called pt's wife, LVM

## 2017-05-16 ENCOUNTER — Encounter (HOSPITAL_COMMUNITY): Payer: Self-pay

## 2017-05-17 DIAGNOSIS — Z7901 Long term (current) use of anticoagulants: Secondary | ICD-10-CM | POA: Diagnosis not present

## 2017-05-17 DIAGNOSIS — I110 Hypertensive heart disease with heart failure: Secondary | ICD-10-CM | POA: Diagnosis not present

## 2017-05-17 DIAGNOSIS — M5030 Other cervical disc degeneration, unspecified cervical region: Secondary | ICD-10-CM | POA: Diagnosis not present

## 2017-05-17 DIAGNOSIS — I272 Pulmonary hypertension, unspecified: Secondary | ICD-10-CM | POA: Diagnosis not present

## 2017-05-17 DIAGNOSIS — J439 Emphysema, unspecified: Secondary | ICD-10-CM | POA: Diagnosis not present

## 2017-05-17 DIAGNOSIS — I5042 Chronic combined systolic (congestive) and diastolic (congestive) heart failure: Secondary | ICD-10-CM | POA: Diagnosis not present

## 2017-05-17 DIAGNOSIS — Z9981 Dependence on supplemental oxygen: Secondary | ICD-10-CM | POA: Diagnosis not present

## 2017-05-17 DIAGNOSIS — I481 Persistent atrial fibrillation: Secondary | ICD-10-CM | POA: Diagnosis not present

## 2017-05-17 DIAGNOSIS — C9190 Lymphoid leukemia, unspecified not having achieved remission: Secondary | ICD-10-CM | POA: Diagnosis not present

## 2017-05-17 DIAGNOSIS — Z9181 History of falling: Secondary | ICD-10-CM | POA: Diagnosis not present

## 2017-05-17 DIAGNOSIS — J9611 Chronic respiratory failure with hypoxia: Secondary | ICD-10-CM | POA: Diagnosis not present

## 2017-05-17 DIAGNOSIS — M1990 Unspecified osteoarthritis, unspecified site: Secondary | ICD-10-CM | POA: Diagnosis not present

## 2017-05-17 DIAGNOSIS — I951 Orthostatic hypotension: Secondary | ICD-10-CM | POA: Diagnosis not present

## 2017-05-17 DIAGNOSIS — C880 Waldenstrom macroglobulinemia: Secondary | ICD-10-CM | POA: Diagnosis not present

## 2017-05-17 DIAGNOSIS — E43 Unspecified severe protein-calorie malnutrition: Secondary | ICD-10-CM | POA: Diagnosis not present

## 2017-05-17 DIAGNOSIS — I739 Peripheral vascular disease, unspecified: Secondary | ICD-10-CM | POA: Diagnosis not present

## 2017-05-18 DIAGNOSIS — J9611 Chronic respiratory failure with hypoxia: Secondary | ICD-10-CM | POA: Diagnosis not present

## 2017-05-18 DIAGNOSIS — I272 Pulmonary hypertension, unspecified: Secondary | ICD-10-CM | POA: Diagnosis not present

## 2017-05-18 DIAGNOSIS — E43 Unspecified severe protein-calorie malnutrition: Secondary | ICD-10-CM | POA: Diagnosis not present

## 2017-05-18 DIAGNOSIS — I110 Hypertensive heart disease with heart failure: Secondary | ICD-10-CM | POA: Diagnosis not present

## 2017-05-18 DIAGNOSIS — I5042 Chronic combined systolic (congestive) and diastolic (congestive) heart failure: Secondary | ICD-10-CM | POA: Diagnosis not present

## 2017-05-18 DIAGNOSIS — I739 Peripheral vascular disease, unspecified: Secondary | ICD-10-CM | POA: Diagnosis not present

## 2017-05-18 DIAGNOSIS — Z9181 History of falling: Secondary | ICD-10-CM | POA: Diagnosis not present

## 2017-05-18 DIAGNOSIS — Z9981 Dependence on supplemental oxygen: Secondary | ICD-10-CM | POA: Diagnosis not present

## 2017-05-18 DIAGNOSIS — C880 Waldenstrom macroglobulinemia: Secondary | ICD-10-CM | POA: Diagnosis not present

## 2017-05-18 DIAGNOSIS — M5030 Other cervical disc degeneration, unspecified cervical region: Secondary | ICD-10-CM | POA: Diagnosis not present

## 2017-05-18 DIAGNOSIS — Z7901 Long term (current) use of anticoagulants: Secondary | ICD-10-CM | POA: Diagnosis not present

## 2017-05-18 DIAGNOSIS — I951 Orthostatic hypotension: Secondary | ICD-10-CM | POA: Diagnosis not present

## 2017-05-18 DIAGNOSIS — M1990 Unspecified osteoarthritis, unspecified site: Secondary | ICD-10-CM | POA: Diagnosis not present

## 2017-05-18 DIAGNOSIS — I481 Persistent atrial fibrillation: Secondary | ICD-10-CM | POA: Diagnosis not present

## 2017-05-18 DIAGNOSIS — C9190 Lymphoid leukemia, unspecified not having achieved remission: Secondary | ICD-10-CM | POA: Diagnosis not present

## 2017-05-18 DIAGNOSIS — J439 Emphysema, unspecified: Secondary | ICD-10-CM | POA: Diagnosis not present

## 2017-05-19 ENCOUNTER — Telehealth: Payer: Self-pay | Admitting: Internal Medicine

## 2017-05-19 ENCOUNTER — Other Ambulatory Visit: Payer: Self-pay | Admitting: Internal Medicine

## 2017-05-19 DIAGNOSIS — J9611 Chronic respiratory failure with hypoxia: Secondary | ICD-10-CM | POA: Diagnosis not present

## 2017-05-19 DIAGNOSIS — I951 Orthostatic hypotension: Secondary | ICD-10-CM | POA: Diagnosis not present

## 2017-05-19 DIAGNOSIS — I739 Peripheral vascular disease, unspecified: Secondary | ICD-10-CM | POA: Diagnosis not present

## 2017-05-19 DIAGNOSIS — I272 Pulmonary hypertension, unspecified: Secondary | ICD-10-CM | POA: Diagnosis not present

## 2017-05-19 DIAGNOSIS — C9190 Lymphoid leukemia, unspecified not having achieved remission: Secondary | ICD-10-CM | POA: Diagnosis not present

## 2017-05-19 DIAGNOSIS — E43 Unspecified severe protein-calorie malnutrition: Secondary | ICD-10-CM | POA: Diagnosis not present

## 2017-05-19 DIAGNOSIS — Z7901 Long term (current) use of anticoagulants: Secondary | ICD-10-CM | POA: Diagnosis not present

## 2017-05-19 DIAGNOSIS — I110 Hypertensive heart disease with heart failure: Secondary | ICD-10-CM | POA: Diagnosis not present

## 2017-05-19 DIAGNOSIS — Z9181 History of falling: Secondary | ICD-10-CM | POA: Diagnosis not present

## 2017-05-19 DIAGNOSIS — I481 Persistent atrial fibrillation: Secondary | ICD-10-CM | POA: Diagnosis not present

## 2017-05-19 DIAGNOSIS — J439 Emphysema, unspecified: Secondary | ICD-10-CM | POA: Diagnosis not present

## 2017-05-19 DIAGNOSIS — M5030 Other cervical disc degeneration, unspecified cervical region: Secondary | ICD-10-CM | POA: Diagnosis not present

## 2017-05-19 DIAGNOSIS — C880 Waldenstrom macroglobulinemia: Secondary | ICD-10-CM | POA: Diagnosis not present

## 2017-05-19 DIAGNOSIS — Z9981 Dependence on supplemental oxygen: Secondary | ICD-10-CM | POA: Diagnosis not present

## 2017-05-19 DIAGNOSIS — M1990 Unspecified osteoarthritis, unspecified site: Secondary | ICD-10-CM | POA: Diagnosis not present

## 2017-05-19 DIAGNOSIS — I5042 Chronic combined systolic (congestive) and diastolic (congestive) heart failure: Secondary | ICD-10-CM | POA: Diagnosis not present

## 2017-05-19 MED ORDER — NYSTATIN 100000 UNIT/GM EX CREA: TOPICAL_CREAM | CUTANEOUS | 0 refills | Status: DC

## 2017-05-19 MED ORDER — TRAZODONE HCL 50 MG PO TABS: 25.0000 mg | ORAL_TABLET | Freq: Every evening | ORAL | 1 refills | Status: DC | PRN

## 2017-05-19 NOTE — Telephone Encounter (Signed)
Done erx 

## 2017-05-19 NOTE — Telephone Encounter (Signed)
Copied from [place]. Topic: Quick Communication - Rx Refill/Question >> May 19, 2017 10:08 AM Synthia Innocent wrote: Has the patient contacted their pharmacy? Yes.     (Agent: If no, request that the patient contact the pharmacy for the refill.)   Preferred Pharmacy (with phone number or street name): [facility] on [address]   Agent: Please be advised that RX refills may take up to 3 business days. We ask that you follow-up with your pharmacy. Would like trazodone called in, mirtazapine (REMERON) 30 MG tablet is making him sleep to much.  Also would like refill on nystatin cream (MYCOSTATIN)

## 2017-05-19 NOTE — Telephone Encounter (Signed)
[place] for [employer]

## 2017-05-19 NOTE — Telephone Encounter (Signed)
Per chart cream already been sent pls advise on msg on remeron and trazodone.Marland KitchenJohny Chess

## 2017-05-19 NOTE — Telephone Encounter (Signed)
Copied from [place] [id number]. Topic: Inquiry >> May 19, 2017  1:54 PM Conception Chancy, NT wrote: Reason for CRM: Newell Coral is calling from [facility] and is requesting verbal orders for physical therapy 2x a week for 4 weeks. Effective yesterday 05/18/17.  Call back [phone] ok to leave voicemail

## 2017-05-19 NOTE — Telephone Encounter (Signed)
Notified Denise w/MD response...[name]

## 2017-05-19 NOTE — Telephone Encounter (Signed)
[place] for [employer]

## 2017-05-19 NOTE — Telephone Encounter (Signed)
Copied from [place]. Topic: General - Other >> May 19, 2017 11:01 AM Lolita Rieger, RMA wrote: Reason for CRM: Langley Gauss form [facility] called needing verbal for nursing 3 times a week for 1 week and 2 times a week for 3 weeks and 1 times a week for 5 weeks Please call [phone]

## 2017-05-19 NOTE — Telephone Encounter (Signed)
Notified pt wife MD sent rx's to [place]...[name]

## 2017-05-20 DIAGNOSIS — Z9981 Dependence on supplemental oxygen: Secondary | ICD-10-CM | POA: Diagnosis not present

## 2017-05-20 DIAGNOSIS — I951 Orthostatic hypotension: Secondary | ICD-10-CM | POA: Diagnosis not present

## 2017-05-20 DIAGNOSIS — I481 Persistent atrial fibrillation: Secondary | ICD-10-CM | POA: Diagnosis not present

## 2017-05-20 DIAGNOSIS — Z9181 History of falling: Secondary | ICD-10-CM | POA: Diagnosis not present

## 2017-05-20 DIAGNOSIS — J439 Emphysema, unspecified: Secondary | ICD-10-CM | POA: Diagnosis not present

## 2017-05-20 DIAGNOSIS — J9611 Chronic respiratory failure with hypoxia: Secondary | ICD-10-CM | POA: Diagnosis not present

## 2017-05-20 DIAGNOSIS — M5030 Other cervical disc degeneration, unspecified cervical region: Secondary | ICD-10-CM | POA: Diagnosis not present

## 2017-05-20 DIAGNOSIS — M1990 Unspecified osteoarthritis, unspecified site: Secondary | ICD-10-CM | POA: Diagnosis not present

## 2017-05-20 DIAGNOSIS — I272 Pulmonary hypertension, unspecified: Secondary | ICD-10-CM | POA: Diagnosis not present

## 2017-05-20 DIAGNOSIS — C9190 Lymphoid leukemia, unspecified not having achieved remission: Secondary | ICD-10-CM | POA: Diagnosis not present

## 2017-05-20 DIAGNOSIS — C880 Waldenstrom macroglobulinemia: Secondary | ICD-10-CM | POA: Diagnosis not present

## 2017-05-20 DIAGNOSIS — Z7901 Long term (current) use of anticoagulants: Secondary | ICD-10-CM | POA: Diagnosis not present

## 2017-05-20 DIAGNOSIS — I739 Peripheral vascular disease, unspecified: Secondary | ICD-10-CM | POA: Diagnosis not present

## 2017-05-20 DIAGNOSIS — E43 Unspecified severe protein-calorie malnutrition: Secondary | ICD-10-CM | POA: Diagnosis not present

## 2017-05-20 DIAGNOSIS — I5042 Chronic combined systolic (congestive) and diastolic (congestive) heart failure: Secondary | ICD-10-CM | POA: Diagnosis not present

## 2017-05-20 DIAGNOSIS — I110 Hypertensive heart disease with heart failure: Secondary | ICD-10-CM | POA: Diagnosis not present

## 2017-05-20 NOTE — Telephone Encounter (Signed)
Called [name] no answer LMOM w/MD response...[name]

## 2017-05-21 ENCOUNTER — Telehealth (HOSPITAL_COMMUNITY): Payer: Self-pay | Admitting: *Deleted

## 2017-05-21 ENCOUNTER — Ambulatory Visit: Payer: Self-pay | Admitting: *Deleted

## 2017-05-21 ENCOUNTER — Telehealth: Payer: Self-pay | Admitting: Internal Medicine

## 2017-05-21 DIAGNOSIS — C9190 Lymphoid leukemia, unspecified not having achieved remission: Secondary | ICD-10-CM | POA: Diagnosis not present

## 2017-05-21 DIAGNOSIS — J9611 Chronic respiratory failure with hypoxia: Secondary | ICD-10-CM | POA: Diagnosis not present

## 2017-05-21 DIAGNOSIS — E43 Unspecified severe protein-calorie malnutrition: Secondary | ICD-10-CM | POA: Diagnosis not present

## 2017-05-21 DIAGNOSIS — I5042 Chronic combined systolic (congestive) and diastolic (congestive) heart failure: Secondary | ICD-10-CM | POA: Diagnosis not present

## 2017-05-21 DIAGNOSIS — Z9981 Dependence on supplemental oxygen: Secondary | ICD-10-CM | POA: Diagnosis not present

## 2017-05-21 DIAGNOSIS — M1990 Unspecified osteoarthritis, unspecified site: Secondary | ICD-10-CM | POA: Diagnosis not present

## 2017-05-21 DIAGNOSIS — I272 Pulmonary hypertension, unspecified: Secondary | ICD-10-CM | POA: Diagnosis not present

## 2017-05-21 DIAGNOSIS — M5030 Other cervical disc degeneration, unspecified cervical region: Secondary | ICD-10-CM | POA: Diagnosis not present

## 2017-05-21 DIAGNOSIS — I481 Persistent atrial fibrillation: Secondary | ICD-10-CM | POA: Diagnosis not present

## 2017-05-21 DIAGNOSIS — Z7901 Long term (current) use of anticoagulants: Secondary | ICD-10-CM | POA: Diagnosis not present

## 2017-05-21 DIAGNOSIS — I739 Peripheral vascular disease, unspecified: Secondary | ICD-10-CM | POA: Diagnosis not present

## 2017-05-21 DIAGNOSIS — Z9181 History of falling: Secondary | ICD-10-CM | POA: Diagnosis not present

## 2017-05-21 DIAGNOSIS — J439 Emphysema, unspecified: Secondary | ICD-10-CM | POA: Diagnosis not present

## 2017-05-21 DIAGNOSIS — C880 Waldenstrom macroglobulinemia: Secondary | ICD-10-CM | POA: Diagnosis not present

## 2017-05-21 DIAGNOSIS — I110 Hypertensive heart disease with heart failure: Secondary | ICD-10-CM | POA: Diagnosis not present

## 2017-05-21 DIAGNOSIS — I951 Orthostatic hypotension: Secondary | ICD-10-CM | POA: Diagnosis not present

## 2017-05-21 NOTE — Telephone Encounter (Signed)
Diane the nurse with [facility] said the pt's wife called them this morning after finding dried blood on his face.   When Diane got there she cleaned him up and found a blood clot in the inner corner of his left eye.    No further bleeding noted.    (He is on blood thinners). They have a transportation problem with getting into an appt because the pt is bed bound.   They do have an appt with Oncology tomorrow at 10:00.     I routed a note to Dr. Gwynn Burly nurse pool making him aware of the situation.

## 2017-05-21 NOTE — Telephone Encounter (Signed)
RN with [facility] called to report patients wife called c/o blood on patients face and left eye. RN went to the home to visit pt this morning she was able to clean the blood off of his face but did notice a blood clot in the inner corner of patients left eye and that his entire eye was red. RN is going to contact PCP and try to get patient evaluated in PCP office today.

## 2017-05-21 NOTE — Telephone Encounter (Signed)
Triage chart opened in error

## 2017-05-22 ENCOUNTER — Inpatient Hospital Stay: Payer: Medicare Other

## 2017-05-22 ENCOUNTER — Inpatient Hospital Stay: Payer: Medicare Other | Attending: Hematology | Admitting: Hematology

## 2017-05-22 ENCOUNTER — Encounter (HOSPITAL_COMMUNITY): Payer: Self-pay

## 2017-05-22 ENCOUNTER — Telehealth: Payer: Self-pay | Admitting: Hematology

## 2017-05-22 ENCOUNTER — Telehealth (HOSPITAL_COMMUNITY): Payer: Self-pay | Admitting: *Deleted

## 2017-05-22 ENCOUNTER — Other Ambulatory Visit (HOSPITAL_COMMUNITY): Payer: Medicare Other

## 2017-05-22 VITALS — BP 91/60

## 2017-05-22 VITALS — BP 86/64 | HR 85 | Temp 98.0°F | Resp 18 | Ht 71.0 in | Wt 171.2 lb

## 2017-05-22 DIAGNOSIS — D472 Monoclonal gammopathy: Secondary | ICD-10-CM | POA: Diagnosis not present

## 2017-05-22 DIAGNOSIS — R5383 Other fatigue: Secondary | ICD-10-CM | POA: Diagnosis not present

## 2017-05-22 DIAGNOSIS — E43 Unspecified severe protein-calorie malnutrition: Secondary | ICD-10-CM | POA: Diagnosis not present

## 2017-05-22 DIAGNOSIS — M549 Dorsalgia, unspecified: Secondary | ICD-10-CM | POA: Insufficient documentation

## 2017-05-22 DIAGNOSIS — E8581 Light chain (AL) amyloidosis: Secondary | ICD-10-CM | POA: Insufficient documentation

## 2017-05-22 DIAGNOSIS — I959 Hypotension, unspecified: Secondary | ICD-10-CM | POA: Diagnosis not present

## 2017-05-22 DIAGNOSIS — C880 Waldenstrom macroglobulinemia: Secondary | ICD-10-CM | POA: Insufficient documentation

## 2017-05-22 DIAGNOSIS — G8929 Other chronic pain: Secondary | ICD-10-CM | POA: Diagnosis not present

## 2017-05-22 DIAGNOSIS — C8300 Small cell B-cell lymphoma, unspecified site: Secondary | ICD-10-CM

## 2017-05-22 DIAGNOSIS — N049 Nephrotic syndrome with unspecified morphologic changes: Secondary | ICD-10-CM | POA: Diagnosis not present

## 2017-05-22 DIAGNOSIS — Z5112 Encounter for antineoplastic immunotherapy: Secondary | ICD-10-CM | POA: Diagnosis not present

## 2017-05-22 DIAGNOSIS — Z5111 Encounter for antineoplastic chemotherapy: Secondary | ICD-10-CM | POA: Insufficient documentation

## 2017-05-22 DIAGNOSIS — Z7189 Other specified counseling: Secondary | ICD-10-CM

## 2017-05-22 LAB — COMPREHENSIVE METABOLIC PANEL
ALK PHOS: 229 U/L — AB (ref 40–150)
ALT: 14 U/L (ref 0–55)
ANION GAP: 8 (ref 3–11)
AST: 19 U/L (ref 5–34)
Albumin: 1.8 g/dL — ABNORMAL LOW (ref 3.5–5.0)
BILIRUBIN TOTAL: 0.4 mg/dL (ref 0.2–1.2)
BUN: 29 mg/dL — ABNORMAL HIGH (ref 7–26)
CALCIUM: 8.2 mg/dL — AB (ref 8.4–10.4)
CO2: 25 mmol/L (ref 22–29)
CREATININE: 0.96 mg/dL (ref 0.70–1.30)
Chloride: 105 mmol/L (ref 98–109)
GFR calc Af Amer: >60 mL/min (ref >60)
Glucose, Bld: 87 mg/dL (ref 70–140)
Potassium: 4.8 mmol/L (ref 3.5–5.1)
Sodium: 138 mmol/L (ref 136–145)
TOTAL PROTEIN: 4.4 g/dL — AB (ref 6.4–8.3)

## 2017-05-22 LAB — CBC WITH DIFFERENTIAL (CANCER CENTER ONLY)
BASOS PCT: 2 %
Basophils Absolute: 0.1 10*3/uL (ref 0.0–0.1)
EOS ABS: 0.1 10*3/uL (ref 0.0–0.5)
Eosinophils Relative: 2 %
HCT: 35.6 % — ABNORMAL LOW (ref 38.4–49.9)
HEMOGLOBIN: 11.5 g/dL — AB (ref 13.0–17.1)
Lymphocytes Relative: 13 %
Lymphs Abs: 0.5 10*3/uL — ABNORMAL LOW (ref 0.9–3.3)
MCH: 31.3 pg (ref 27.2–33.4)
MCHC: 32.3 g/dL (ref 32.0–36.0)
MCV: 96.7 fL (ref 79.3–98.0)
MONOS PCT: 8 %
Monocytes Absolute: 0.3 10*3/uL (ref 0.1–0.9)
NEUTROS PCT: 75 %
Neutro Abs: 3.1 10*3/uL (ref 1.5–6.5)
Platelet Count: 370 10*3/uL (ref 140–400)
RBC: 3.68 MIL/uL — AB (ref 4.20–5.82)
RDW: 18.5 % — ABNORMAL HIGH (ref 11.0–15.6)
WBC: 4 10*3/uL (ref 4.0–10.3)

## 2017-05-22 LAB — LACTATE DEHYDROGENASE: LDH: 193 U/L (ref 125–245)

## 2017-05-22 LAB — RETICULOCYTES
RBC.: 3.68 MIL/uL — AB (ref 4.20–5.82)
RETIC CT PCT: 1.3 % (ref 0.8–1.8)
Retic Count, Absolute: 47.8 10*3/uL (ref 34.8–93.9)

## 2017-05-22 MED ORDER — ACETAMINOPHEN 325 MG PO TABS
650.0000 mg | ORAL_TABLET | Freq: Once | ORAL | Status: AC
  Administered 2017-05-22: 650 mg via ORAL

## 2017-05-22 MED ORDER — ACETAMINOPHEN 325 MG PO TABS
ORAL_TABLET | ORAL | Status: AC
  Filled 2017-05-22: qty 2

## 2017-05-22 MED ORDER — SODIUM CHLORIDE 0.9 % IV SOLN
Freq: Once | INTRAVENOUS | Status: AC
  Administered 2017-05-22: 12:00:00 via INTRAVENOUS

## 2017-05-22 MED ORDER — DEXAMETHASONE SODIUM PHOSPHATE 10 MG/ML IJ SOLN
10.0000 mg | Freq: Once | INTRAMUSCULAR | Status: AC
  Administered 2017-05-22: 10 mg via INTRAVENOUS

## 2017-05-22 MED ORDER — DEXAMETHASONE SODIUM PHOSPHATE 10 MG/ML IJ SOLN
INTRAMUSCULAR | Status: AC
  Filled 2017-05-22: qty 1

## 2017-05-22 MED ORDER — SODIUM CHLORIDE 0.9 % IV SOLN
70.0000 mg/m2 | Freq: Once | INTRAVENOUS | Status: AC
  Administered 2017-05-22: 125 mg via INTRAVENOUS
  Filled 2017-05-22: qty 5

## 2017-05-22 MED ORDER — DIPHENHYDRAMINE HCL 25 MG PO CAPS
50.0000 mg | ORAL_CAPSULE | Freq: Once | ORAL | Status: AC
  Administered 2017-05-22: 50 mg via ORAL

## 2017-05-22 MED ORDER — SODIUM CHLORIDE 0.9 % IV SOLN
375.0000 mg/m2 | Freq: Once | INTRAVENOUS | Status: AC
  Administered 2017-05-22: 700 mg via INTRAVENOUS
  Filled 2017-05-22: qty 50

## 2017-05-22 MED ORDER — SODIUM CHLORIDE 0.9 % IV SOLN: 375.0000 mg/m2 | Freq: Once | INTRAVENOUS | Status: DC

## 2017-05-22 MED ORDER — DIPHENHYDRAMINE HCL 25 MG PO CAPS
ORAL_CAPSULE | ORAL | Status: AC
  Filled 2017-05-22: qty 2

## 2017-05-22 MED ORDER — PALONOSETRON HCL INJECTION 0.25 MG/5ML
0.2500 mg | Freq: Once | INTRAVENOUS | Status: AC
  Administered 2017-05-22: 0.25 mg via INTRAVENOUS

## 2017-05-22 MED ORDER — PALONOSETRON HCL INJECTION 0.25 MG/5ML
INTRAVENOUS | Status: AC
  Filled 2017-05-22: qty 5

## 2017-05-22 NOTE — Progress Notes (Signed)
HEMATOLOGY/ONCOLOGY CLINIC NOTE   Date of Service: 05/22/17  Patient Care Team: Biagio Borg, MD as PCP - General (Internal Medicine) [name], MD as PCP - Cardiology (Cardiology) Donita Brooks, MD as Attending Physician (Internal Medicine) Mack Guise. Lacinda Axon, MD as Consulting Physician (Urology)   CHIEF COMPLAINTS/PURPOSE OF CONSULTATION:  AL Amyloidosis  Malignant lymphoplasmacytic lymphoma   HISTORY OF PRESENTING ILLNESS:  [name] is a wonderful 72 y.o. 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  Patient follows with Dr Alen Blew for his h/o prostate cancer and was found to have borderline lymphadenopathy dating back to January 2018. He had a mediastinal adenopathy that was borderline detected on a CT scan. A repeat CT scan on 05/17/2016 showed persistent enlarged mediastinal lymph nodes and a left internal mammary lymph node. Differential considerations include lymphoproliferative disorder, granulomatous inflammation/infection or reactive adenopathy. left cervical LN Bx in 10/2016 was unrevealing.  Patient has multiple co-morbidities including PAF on Xarelto status post DCCV 07/12/47, diastolic CHF with LVEF 70-26% on echo 05/14/16, COPD with pulmonary hypertension on home O2 at night time s/p right VATS 05/20/16 for empyema. He was admitted with  Worsening fatigue and generalized swelling.  He was noted to have hypoalbuminemia and further w/u demonstrated nephrotic range proteinuria of about 6g per day. Patient's nephrology w/u showed monoclonal paraproteinemia with M spike of 1.1g/dl. He subsequently had a kidney biopsy on 02/27/2017 - which per report --prelim showed AL Amyoidosis. Final pathology pending.  Patient is being diuresed aggresivel.  ECHO 10/13 showed EF of 37-85% Diastolic function could not be evaluated due to Atrial fibrillation.  I discussed the available  results in details with the patient and his wife at bedside.  He has no focal bone pain, Has some anemia. No overt hypercalcemia at this time.   INTERVAL HISTORY:  [name]  is here for fu of his LPL and Renal AL Amyloidosis lambda light chain prior to his 3rd cycle of BR. He is accompanied by his wife. His appetite has been improving, but he still struggles with what he can tolerate. He has swelling to his bilateral hands and feet in the setting of hypoalbuminemia.  He has been better monitoring what he eats and drinks to help with the edema. The patient would like to eat or drink something that can help decrease the severe swelling in his extremities. He is slowly walking with assistance of a physical therapist.  For the most part he is tolerating chemotherapy. Per his wife, he hasn't improved or declined much since his first appointment. He is trying to stay active, even though he has been very fatigued and is in pain all over including his back (chronic). The patient denies fever, chills or any other symptoms or complaints at this time.  His cellulitis in the groin has resolved.    MEDICAL HISTORY:      Past Medical History:  Diagnosis Date  . Anxiety   . Arthritis    "neck" (08/15/2015)  . Chronic bronchitis ([place])   . Chronic diastolic CHF (congestive heart failure) ([place])   . Constipation   . COPD (chronic obstructive pulmonary disease) ([place])   . DDD (degenerative disc disease), cervical   . Depression   . Dilated aortic root ([place])    63m on echo 05/2016  . Edema of left lower extremity   . Facial basal cell cancer   .  H/O acne vulgaris 1960s   "led to my discharge from the [facility] in the mid 1960s"  . Headache    history of - left temporal- years ago- not current (08/15/2015)  . Persistent atrial fibrillation (HCC)    on coumadin with CHADS2VASC score of 2  . Pneumonia 1960s; 2015 X 2  . Prostate cancer ([place]) dx'd early 2000s   "low spreading; non aggressive type" (08/15/2015)   . Pulmonary HTN ([place])    PASP 47mHg by echo 05/2016  . Skin cancer    "back"   SURGICAL HISTORY:       Past Surgical History:  Procedure Laterality Date  . CARDIOVERSION N/A 10/11/2016   Procedure: CARDIOVERSION; Surgeon: CSanda Klein MD; [facility]; Service: Cardiovascular; Laterality: N/A;  . CARDIOVERSION N/A 02/26/2017   Procedure: CARDIOVERSION; Surgeon: MLarey Dresser MD; [facility]; Service: Cardiovascular; Laterality: N/A;  . COLONOSCOPY    . EXCISIONAL HEMORRHOIDECTOMY  1960s  . INGUINAL HERNIA REPAIR Right 2002  . INGUINAL HERNIA REPAIR Bilateral 08/15/2015  . INGUINAL HERNIA REPAIR Bilateral 08/15/2015   Procedure: OPEN REPAIR RECURRENT RIGHT INGUINAL HERNIA WITH MESH AND REPAIR LEFT INGUINAL HERNIA WITH MESH; Surgeon: HFanny Skates MD; [facility] Service: General; Laterality: Bilateral;  . INSERTION OF MESH Bilateral 08/15/2015   Procedure: INSERTION OF MESH; Surgeon: HFanny Skates MD; [place] [facility] Service: General; Laterality: Bilateral;  . IR THORACENTESIS ASP PLEURAL SPACE W/IMG GUIDE  11/18/2016  . LYMPH NODE BIOPSY Bilateral 10/22/2016   Procedure: LEFT NECK LYMPH NODE BIOPSY; Surgeon: VIvin Poot MD; [place] [facility] Service: Thoracic; Laterality: Bilateral;  . PROSTATE BIOPSY    . TONSILLECTOMY  1950s  . VIDEO ASSISTED THORACOSCOPY (VATS)/EMPYEMA Right 05/20/2016   Procedure: VIDEO ASSISTED THORACOSCOPY (VATS) with drainage of pleural effusion; Surgeon: PIvin Poot MD; [place] [facility] Service: Thoracic; Laterality: Right;  .Marland KitchenVIDEO BRONCHOSCOPY WITH ENDOBRONCHIAL ULTRASOUND Right 05/20/2016   Procedure: VIDEO BRONCHOSCOPY WITH ENDOBRONCHIAL ULTRASOUND; Surgeon: PIvin Poot MD; [place] [facility] Service: Thoracic; Laterality: Right;   SOCIAL HISTORY:  Social History        Social History  . Marital status: Married    Spouse name: N/A  . Number of children: N/A  . Years of education: N/A      Occupational  History  . Not on file.         Social History Main Topics  . Smoking status: Former Smoker    Packs/day: 1.00    Years: 62.00    Types: Cigarettes    Quit date: 05/14/2016  . Smokeless tobacco: Never Used  . Alcohol use No  . Drug use: No     Comment: CBD OIL  . Sexual activity: Not Currently    Partners: Female       Other Topics Concern  . Not on file      Social History Narrative  . No narrative on file   FAMILY HISTORY:       Family History  Problem Relation Age of Onset  . Colon cancer Mother   . Ovarian cancer Mother   . Alcoholism Father   . CAD Father   . Alcohol abuse Father   . Ovarian cancer Sister   . Diabetes Neg Hx   . Stomach cancer Neg Hx    ALLERGIES: is allergic to demerol [meperidine] and oxycodone hcl.  MEDICATIONS:   . Current Outpatient Medications:  .  acyclovir (ZOVIRAX) 400 MG tablet, Take 1 tablet (400 mg total) daily by  mouth., Disp: 30 tablet, Rfl: 3 .  dexamethasone (DECADRON) 4 MG tablet, Take 2 tablets (8 mg total) daily by mouth. Start the day after bendamustine chemotherapy for 2 days. Take with food. (Patient not taking: Reported on 04/30/2017), Disp: 30 tablet, Rfl: 1 .  diazepam (VALIUM) 5 MG tablet, 1 by mouth prior to MRI (Patient not taking: Reported on 04/25/2017), Disp: 1 tablet, Rfl: 0 .  docusate sodium (COLACE) 100 MG capsule, Take 1-3 capsules (100-300 mg total) by mouth daily., Disp: 90 capsule, Rfl: 5 .  feeding supplement (BOOST / RESOURCE BREEZE) LIQD, Take 1 Container by mouth 3 (three) times daily between meals., Disp: 5 Container, Rfl: 0 .  HYDROcodone-acetaminophen (NORCO) 10-325 MG tablet, Take 1 tablet by mouth every 6 (six) hours as needed., Disp: 120 tablet, Rfl: 0 .  hydrocortisone cream 1 %, Apply 1 application topically 4 (four) times daily as needed for itching. (Patient not taking: Reported on 04/25/2017), Disp: 30 g, Rfl: 0 .  midodrine (PROAMATINE) 10 MG tablet, Take 1 tablet (10 mg total) by mouth 3  (three) times daily with meals., Disp: 90 tablet, Rfl: 3 .  nystatin cream (MYCOSTATIN), APPLY EXTERNALLY TO THE AFFECTED AREA TWICE DAILY, Disp: 30 g, Rfl: 0 .  ondansetron (ZOFRAN) 8 MG tablet, Take 1 tablet (8 mg total) 2 (two) times daily as needed by mouth for refractory nausea / vomiting. Start on day 2 after bendamustine chemo. (Patient not taking: Reported on 04/25/2017), Disp: 30 tablet, Rfl: 1 .  polyethylene glycol (MIRALAX / GLYCOLAX) packet, Take 17 g by mouth daily as needed. (Patient not taking: Reported on 04/25/2017), Disp: 14 each, Rfl: 0 .  prochlorperazine (COMPAZINE) 10 MG tablet, Take 1 tablet (10 mg total) every 6 (six) hours as needed by mouth (Nausea or vomiting). (Patient not taking: Reported on 04/25/2017), Disp: 30 tablet, Rfl: 1 .  rivaroxaban (XARELTO) 20 MG TABS tablet, Take 1 tablet (20 mg total) by mouth daily with supper., Disp: 30 tablet, Rfl: 6 .  senna-docusate (SENNA S) 8.6-50 MG tablet, Take 2 tablets by mouth 2 (two) times daily. Reduce to 2 tab po HS once constipation resolved, Disp: 60 tablet, Rfl: 1 .  traZODone (DESYREL) 50 MG tablet, Take 0.5-1 tablets (25-50 mg total) by mouth at bedtime as needed for sleep., Disp: 90 tablet, Rfl: 1  REVIEW OF SYSTEMS:  10 Point review of Systems was done is negative except as noted above.   PHYSICAL EXAMINATION:  ECOG PERFORMANCE STATUS: 2 - Symptomatic, <50% confined to bed  . Marland KitchenBP (!) 86/64 (BP Location: Left Arm, Patient Position: Sitting)   Pulse 85   Temp 98 F (36.7 C) (Oral)   Resp 18   Ht _0  (1.803 m)   Wt 171 lb 3.2 oz (77.7 kg)   SpO2 97%   BMI 23.88 kg/m  .Body mass index is 21.23 kg/m.    GENERAL:alert, in no acute distress and comfortable  SKIN: no acute rashes, no significant lesions  EYES: conjunctiva are pink and non-injected, sclera anicteric  OROPHARYNX: MMM, no exudates, no oropharyngeal erythema or ulceration  NECK: supple, no JVD  LYMPH: no palpable lymphadenopathy in the  cervical, axillary or inguinal regions  LUNGS: clear to auscultation b/l with normal respiratory effort  HEART: regular rate & rhythm  ABDOMEN: normoactive bowel sounds , non tender, not distended.  Extremity: 3+pitting pedal edema  PSYCH: alert & oriented x 3 with fluent speech  NEURO: no focal motor/sensory deficits   LABORATORY DATA:  I have reviewed the data as listed  Component     Latest Ref Rng & Units 05/22/2017  WBC Count     4.0 - 10.3 K/uL 4.0  RBC     4.20 - 5.82 MIL/uL 3.68 (L)  Hemoglobin     13.0 - 17.1 g/dL 11.5 (L)  HCT     38.4 - 49.9 % 35.6 (L)  MCV     79.3 - 98.0 fL 96.7  MCH     27.2 - 33.4 pg 31.3  MCHC     32.0 - 36.0 g/dL 32.3  RDW     11.0 - 15.6 % 18.5 (H)  Platelet Count     140 - 400 K/uL 370  Neutrophils     % 75  NEUT#     1.5 - 6.5 K/uL 3.1  Lymphocytes     % 13  Lymphocyte #     0.9 - 3.3 K/uL 0.5 (L)  Monocytes Relative     % 8  Monocyte #     0.1 - 0.9 K/uL 0.3  Eosinophil     % 2  Eosinophils Absolute     0.0 - 0.5 K/uL 0.1  Basophil     % 2  Basophils Absolute     0.0 - 0.1 K/uL 0.1  Sodium     136 - 145 mmol/L 138  Potassium     3.5 - 5.1 mmol/L 4.8  Chloride     98 - 109 mmol/L 105  CO2     22 - 29 mmol/L 25  Glucose     70 - 140 mg/dL 87  BUN     7 - 26 mg/dL 29 (H)  Creatinine     0.70 - 1.30 mg/dL 0.96  Calcium     8.4 - 10.4 mg/dL 8.2 (L)  Total Protein     6.4 - 8.3 g/dL 4.4 (L)  Albumin     3.5 - 5.0 g/dL 1.8 (L)  AST     5 - 34 U/L 19  ALT     0 - 55 U/L 14  Alkaline Phosphatase     40 - 150 U/L 229 (H)  Total Bilirubin     0.2 - 1.2 mg/dL 0.4  GFR, Est Non African American     >60 mL/min >60  GFR, Est African American     >60 mL/min >60  Anion gap     3 - 11 8  Retic Ct Pct     0.8 - 1.8 % 1.3  RBC.     4.20 - 5.82 MIL/uL 3.68 (L)  Retic Count, Absolute     34.8 - 93.9 K/uL 47.8  LDH     125 - 245 U/L 193    Component     Latest Ref Rng & Units 03/01/2017 05/22/2017  Kappa free  light chain     3.3 - 19.4 mg/L 168.2 (H) 117.9 (H)  Lamda free light chains     5.7 - 26.3 mg/L 134.6 (H) 43.0 (H)  Kappa, lamda light chain ratio     0.26 - 1.65 1.25 2.74 (H)    Component     Latest Ref Rng & Units 03/01/2017  IgG (Immunoglobin G), Serum     700 - 1,600 mg/dL 476 (L)  IgA     61 - 437 mg/dL 72  IgM (Immunoglobulin M), Srm     15 - 143 mg/dL 1,757 (H)  Total Protein ELP  6.0 - 8.5 g/dL 4.9 (L)  Albumin SerPl Elph-Mcnc     2.9 - 4.4 g/dL 1.6 (L)  Alpha 1     0.0 - 0.4 g/dL 0.3  Alpha2 Glob SerPl Elph-Mcnc     0.4 - 1.0 g/dL 0.8  B-Globulin SerPl Elph-Mcnc     0.7 - 1.3 g/dL 0.7  Gamma Glob SerPl Elph-Mcnc     0.4 - 1.8 g/dL 1.5  M Protein SerPl Elph-Mcnc     Not Observed g/dL 1.2 (H)  Globulin, Total     2.2 - 3.9 g/dL 3.3  Albumin/Glob SerPl     0.7 - 1.7 0.5 (L)  IFE 1      Comment  Please Note (HCV):      Comment  Kappa free light chain     3.3 - 19.4 mg/L 168.2 (H)  Lamda free light chains     5.7 - 26.3 mg/L 134.6 (H)  Kappa, lamda light chain ratio     0.26 - 1.65 1.25  LDH     98 - 192 U/L 148  Beta-2 Microglobulin     0.6 - 2.4 mg/L 4.0 (H)                 *[place]*  *[facility]*  [address], [place] [address]  [phone]  -------------------------------------------------------------------  Transthoracic Echocardiography  Patient: Terell, Kincy  MR #: [id number]  Study Date: 02/22/2017  Gender: M  Age: 19  Height: 180.3 cm  Weight: 73 kg  BSA: 1.91 m^2  Pt. Status:  Room: [place], [place], [place]  Gillis Santa [id number]  Esau Grew [id number]  PERFORMING [facility], Inpatient  SONOGRAPHER Jannett Celestine, RDCS  cc:  -------------------------------------------------------------------  LV EF: 60% - 65%  -------------------------------------------------------------------  Indications: CHF -  428.0.  -------------------------------------------------------------------  History: PMH: Pulmonary Hypertension  Atrial fibrillation. Congestive heart failure. Chronic  obstructive pulmonary disease. Risk factors: Former tobacco use.  -------------------------------------------------------------------  Study Conclusions  - Left ventricle: The cavity size was normal. There was moderate  concentric hypertrophy. Systolic function was normal. The  estimated ejection fraction was in the range of 60% to 65%. Wall  motion was normal; there were no regional wall motion  abnormalities. The study was not technically sufficient to allow  evaluation of LV diastolic dysfunction due to atrial  fibrillation.  - Aortic valve: Trileaflet; normal thickness, mildly calcified  leaflets. There was trivial regurgitation.  - Mitral valve: Calcified annulus. There was mild regurgitation.  - Left atrium: The atrium was mildly dilated.  - Right ventricle: The cavity size was moderately dilated. Wall  thickness was normal.  - Right atrium: The atrium was massively dilated.  - Tricuspid valve: There was moderate regurgitation.  - Pulmonic valve: There was trivial regurgitation.  - Pulmonary arteries: PA peak pressure: 51 mm Hg (S).  RADIOGRAPHIC STUDIES:  I have personally reviewed the radiological images as listed and agreed with the findings in the report.   Imaging Results    Whole body skeletal survey 03/02/2017  IMPRESSION: 1. No aggressive lytic or sclerotic bone lesion of the axial and appendicular skeleton.   Electronically Signed   By: Kathreen Devoid   On: 03/02/2017 12:47    ASSESSMENT & PLAN:   72 yo caucasian male with multiple medical co-morbidities with   1) Systemic AL Amyloidosis - lambda light chain based associated with newly diagnosed Lymphoplasmacytic Lymphoma ([place])  No  overt evidence of cardiac involvement based on ECHO   IgM lambda monoclonal  paraproteinemia. 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 24h UPEP with 6.22g  Proteinuria daily.  3) Chronic Low BP -- on midodrine. ? Autonomic dysfunction . Low albumin levels. PLAN  -labs reviewed - stable -no prohibitve toxicity from C2 of BR treatment. -appropriate to proceed with C3 f BR at this time (after discussion of goals of care) -will need close f/u with PCP/nephrology/cardiology for continue mx of diuretics in the setting of soft BP and needing the use of midodrine. --F/u with pcp for pain management of chronic back pain - could consider orthopedic evaluation if needed. -Labs in 3 weeks  . Orders Placed This Encounter  Procedures  . CBC & Diff and Retic    Standing Status:   Future    Standing Expiration Date:   05/25/2018  . CMP ([place] only)    Standing Status:   Future    Standing Expiration Date:   05/25/2018  . Multiple Myeloma Panel (SPEP&IFE w/QIG)    Standing Status:   Future    Standing Expiration Date:   05/25/2018  . Kappa/lambda light chains    Standing Status:   Future    Standing Expiration Date:   06/29/2018  . 24 Hr Ur UPEP/TP    Standing Status:   Future    Standing Expiration Date:   05/25/2018   -will decide on continued BR vs switching to Ibrutinib + Rituxan for malignant LPL 3.)Back pain - Defer pain management and further evaluation to PCP. -MR thoracic spine was ordered by Dr. Cathlean Cower on 03/26/17 for this    4.) SEVERE protein calorie malnutrition  -has been referred to nutritional therapy for this    Labs in 3 weeks RTC with C4 of BR in 4 weeks Schedule C4 of BR as per orders.  All of the patients questions were answered with apparent satisfaction. The patient knows to call the clinic with any problems, questions or concerns.    I spent 20 minutes counseling the patient face to face. The total time spent in the appointment was 25 minutes and more than  50% was on counseling and direct patient cares.   Sullivan Lone MD [place] AAHIVMS [facility]  Hematology/Oncology Physician  [facility]  (Office): [phone]  (Work cell): [phone]  (Fax): [phone]    This document serves as a record of services personally performed by Sullivan Lone, MD. It was created on his behalf by Margit Banda, a trained medical scribe. The creation of this record is based on the scribe's personal observations and the provider's statements to them.   .I have reviewed the above documentation for accuracy and completeness, and I agree with the above. Brunetta Genera MD MS

## 2017-05-22 NOTE — Telephone Encounter (Signed)
Gave avs and calendar for January and february

## 2017-05-22 NOTE — Telephone Encounter (Signed)
Restart Lasix 40 mg daily.  BMET 1 week.  Make sure to take midodrine.

## 2017-05-22 NOTE — Patient Instructions (Addendum)
[facility] Discharge Instructions for Patients Receiving Chemotherapy  Today you received the following chemotherapy agents:  Rituxan and Bendeka.  To help prevent nausea and vomiting after your treatment, we encourage you to take your nausea medication as directed.   If you develop nausea and vomiting that is not controlled by your nausea medication, call the clinic.   BELOW ARE SYMPTOMS THAT SHOULD BE REPORTED IMMEDIATELY:  *FEVER GREATER THAN 100.5 F  *CHILLS WITH OR WITHOUT FEVER  NAUSEA AND VOMITING THAT IS NOT CONTROLLED WITH YOUR NAUSEA MEDICATION  *UNUSUAL SHORTNESS OF BREATH  *UNUSUAL BRUISING OR BLEEDING  TENDERNESS IN MOUTH AND THROAT WITH OR WITHOUT PRESENCE OF ULCERS  *URINARY PROBLEMS  *BOWEL PROBLEMS  UNUSUAL RASH Items with * indicate a potential emergency and should be followed up as soon as possible.  Feel free to call the clinic should you have any questions or concerns. The clinic phone number is [phone].  Please show the CHEMO ALERT CARD at check-in to the Emergency Department and triage nurse.   

## 2017-05-22 NOTE — Telephone Encounter (Signed)
Advanced Heart Failure Triage Encounter  Patient Name: [name]  Date of Call: 05/22/17  Problem: edema and wt gain  Patient's wife called saying he was having increased edema in his legs and arms.  Also his arms have started wheeping.  Patient's lasix was stopped when discharged from hospital.  Since then his weight has gone from 163 to 171.2 lbs.   Plan:  Will send to Dr. Aundra Dubin to review and will call back with his instructions.   Darron Doom, RN

## 2017-05-23 ENCOUNTER — Inpatient Hospital Stay: Payer: Medicare Other | Admitting: Internal Medicine

## 2017-05-23 ENCOUNTER — Emergency Department (HOSPITAL_COMMUNITY): Payer: Medicare Other

## 2017-05-23 ENCOUNTER — Inpatient Hospital Stay: Payer: Medicare Other

## 2017-05-23 ENCOUNTER — Emergency Department (HOSPITAL_COMMUNITY)
Admission: EM | Admit: 2017-05-23 | Discharge: 2017-05-23 | Disposition: A | Discharge status: Home / Self Care | Payer: Medicare Other | Attending: Emergency Medicine | Admitting: Emergency Medicine

## 2017-05-23 ENCOUNTER — Encounter (HOSPITAL_COMMUNITY): Payer: Self-pay | Admitting: Emergency Medicine

## 2017-05-23 VITALS — BP 95/73 | HR 76 | Temp 97.7°F | Resp 20

## 2017-05-23 DIAGNOSIS — M25551 Pain in right hip: Secondary | ICD-10-CM | POA: Diagnosis not present

## 2017-05-23 DIAGNOSIS — I5033 Acute on chronic diastolic (congestive) heart failure: Secondary | ICD-10-CM | POA: Insufficient documentation

## 2017-05-23 DIAGNOSIS — Z5111 Encounter for antineoplastic chemotherapy: Secondary | ICD-10-CM | POA: Diagnosis not present

## 2017-05-23 DIAGNOSIS — I959 Hypotension, unspecified: Secondary | ICD-10-CM | POA: Diagnosis not present

## 2017-05-23 DIAGNOSIS — D472 Monoclonal gammopathy: Secondary | ICD-10-CM | POA: Diagnosis not present

## 2017-05-23 DIAGNOSIS — I110 Hypertensive heart disease with heart failure: Secondary | ICD-10-CM | POA: Insufficient documentation

## 2017-05-23 DIAGNOSIS — N049 Nephrotic syndrome with unspecified morphologic changes: Secondary | ICD-10-CM | POA: Diagnosis not present

## 2017-05-23 DIAGNOSIS — E43 Unspecified severe protein-calorie malnutrition: Secondary | ICD-10-CM | POA: Diagnosis not present

## 2017-05-23 DIAGNOSIS — Z79899 Other long term (current) drug therapy: Secondary | ICD-10-CM | POA: Diagnosis not present

## 2017-05-23 DIAGNOSIS — W109XXA Fall (on) (from) unspecified stairs and steps, initial encounter: Secondary | ICD-10-CM | POA: Diagnosis not present

## 2017-05-23 DIAGNOSIS — J449 Chronic obstructive pulmonary disease, unspecified: Secondary | ICD-10-CM | POA: Diagnosis not present

## 2017-05-23 DIAGNOSIS — C8300 Small cell B-cell lymphoma, unspecified site: Secondary | ICD-10-CM

## 2017-05-23 DIAGNOSIS — W19XXXA Unspecified fall, initial encounter: Secondary | ICD-10-CM

## 2017-05-23 DIAGNOSIS — Z87891 Personal history of nicotine dependence: Secondary | ICD-10-CM | POA: Insufficient documentation

## 2017-05-23 DIAGNOSIS — R5383 Other fatigue: Secondary | ICD-10-CM | POA: Diagnosis not present

## 2017-05-23 DIAGNOSIS — Z7901 Long term (current) use of anticoagulants: Secondary | ICD-10-CM | POA: Diagnosis not present

## 2017-05-23 DIAGNOSIS — S79911A Unspecified injury of right hip, initial encounter: Secondary | ICD-10-CM | POA: Diagnosis not present

## 2017-05-23 DIAGNOSIS — C880 Waldenstrom macroglobulinemia: Secondary | ICD-10-CM | POA: Diagnosis not present

## 2017-05-23 DIAGNOSIS — M549 Dorsalgia, unspecified: Secondary | ICD-10-CM | POA: Diagnosis not present

## 2017-05-23 DIAGNOSIS — Z7189 Other specified counseling: Secondary | ICD-10-CM

## 2017-05-23 DIAGNOSIS — E8581 Light chain (AL) amyloidosis: Secondary | ICD-10-CM | POA: Diagnosis not present

## 2017-05-23 DIAGNOSIS — G8929 Other chronic pain: Secondary | ICD-10-CM | POA: Diagnosis not present

## 2017-05-23 DIAGNOSIS — Z5112 Encounter for antineoplastic immunotherapy: Secondary | ICD-10-CM | POA: Diagnosis not present

## 2017-05-23 LAB — KAPPA/LAMBDA LIGHT CHAINS
KAPPA, LAMDA LIGHT CHAIN RATIO: 2.74 — AB (ref 0.26–1.65)
Kappa free light chain: 117.9 mg/L — ABNORMAL HIGH (ref 3.3–19.4)
LAMDA FREE LIGHT CHAINS: 43 mg/L — AB (ref 5.7–26.3)

## 2017-05-23 MED ORDER — PEGFILGRASTIM 6 MG/0.6ML ~~LOC~~ PSKT
PREFILLED_SYRINGE | SUBCUTANEOUS | Status: AC
  Filled 2017-05-23: qty 0.6

## 2017-05-23 MED ORDER — SODIUM CHLORIDE 0.9 % IV SOLN
Freq: Once | INTRAVENOUS | Status: AC
  Administered 2017-05-23: 11:00:00 via INTRAVENOUS

## 2017-05-23 MED ORDER — FUROSEMIDE 40 MG PO TABS: 40.0000 mg | ORAL_TABLET | Freq: Every day | ORAL | 3 refills | Status: DC

## 2017-05-23 MED ORDER — HYDROCODONE-ACETAMINOPHEN 5-325 MG PO TABS
1.0000 | ORAL_TABLET | Freq: Once | ORAL | Status: AC
  Administered 2017-05-23: 1 via ORAL
  Filled 2017-05-23: qty 1

## 2017-05-23 MED ORDER — PEGFILGRASTIM 6 MG/0.6ML ~~LOC~~ PSKT
6.0000 mg | PREFILLED_SYRINGE | Freq: Once | SUBCUTANEOUS | Status: AC
  Administered 2017-05-23: 6 mg via SUBCUTANEOUS

## 2017-05-23 MED ORDER — SODIUM CHLORIDE 0.9 % IV SOLN
70.0000 mg/m2 | Freq: Once | INTRAVENOUS | Status: AC
  Administered 2017-05-23: 125 mg via INTRAVENOUS
  Filled 2017-05-23: qty 5

## 2017-05-23 MED ORDER — DEXAMETHASONE SODIUM PHOSPHATE 10 MG/ML IJ SOLN
INTRAMUSCULAR | Status: AC
  Filled 2017-05-23: qty 1

## 2017-05-23 MED ORDER — DEXAMETHASONE SODIUM PHOSPHATE 10 MG/ML IJ SOLN
10.0000 mg | Freq: Once | INTRAMUSCULAR | Status: AC
  Administered 2017-05-23: 10 mg via INTRAVENOUS

## 2017-05-23 NOTE — ED Notes (Signed)
Pt able to march in place with RN and PA as witness

## 2017-05-23 NOTE — Progress Notes (Signed)
Patient arrived to clinic today c/o right hip pain s/p "fall." States he was being assisted down steps when he fell on right hip. Claims he was able to bear weight at time of incident. On arrival to treatment room, patient's right hip/upper leg exquisitely tender to palpation. Could not bear weight. Distal PMS intact. Spoke with Sandi Mealy, PA-C regarding patient's complaint. Advised to have patient evaluated in ED after treatment completed. Patient escorted to ED via wheelchair after completion of treatment. Care turned over to ED staff. Patient instructed to return to [facility] for Neulasta when done in ED. Verbalized understanding.

## 2017-05-23 NOTE — ED Notes (Signed)
Bed: WTR7 Expected date:  Expected time:  Means of arrival:  Comments: 

## 2017-05-23 NOTE — Telephone Encounter (Signed)
Prescription faxed to [facility] for bmet in 1 week. [phone].

## 2017-05-23 NOTE — ED Provider Notes (Signed)
[facility] DEPT Provider Note   CSN: [id number] Arrival date & time: 05/23/17  1330     History   Chief Complaint Chief Complaint  Patient presents with  . Hip Pain    HPI [name] is a 72 y.o. male.  HPI   72 year old male with a past medical history of diastolic heart failure, persistent atrial fibrillation, malignant lymphoma, malnutrition presents today status post fall.  Patient reports that he was going to chemotherapy when 1 of his neighbors was helping him downstairs.  He notes his neighbor was injured and not at 100%.  He reports that at the last step he began to fall and his neighbor was unable to help him.  He notes falling to his right side hitting his right hip.  He notes that he has had persistent right-sided hip pain since the incident.  He notes he is able to range his hip and knee without significant discomfort.  Patient notes a history of chronic pain throughout his entire body, notes that he is unable to ambulate on his own and requires significant assistance.  Patient denies any distal neurological deficits.  Patient notes chronic edema to the bilateral upper and lower extremities.  Patient reports that he did not strike his head, denies any upper extremity pain, or changes in his neck or back pain.  Past Medical History:  Diagnosis Date  . Anxiety   . Arthritis    "neck" (08/15/2015)  . Chronic bronchitis ([place])   . Chronic diastolic CHF (congestive heart failure) ([place])   . Constipation   . COPD (chronic obstructive pulmonary disease) ([place])   . DDD (degenerative disc disease), cervical   . Depression   . Dilated aortic root ([place])    85m on echo 05/2016  . Edema of left lower extremity   . Facial basal cell cancer   . H/O acne vulgaris 1960s   [facility]  . Headache    history of - left temporal- years ago- not current (08/15/2015)  . Persistent atrial fibrillation (HCC)    on  coumadin with CHADS2VASC score of 2  . Pneumonia 1960s; 2015 X 2  . Prostate cancer (HDarby dx'd early 2000s   "low spreading; non aggressive type" (08/15/2015)  . Pulmonary HTN ([place]    PASP 428mg by echo 05/2016  . Skin cancer    "back"    Patient Active Problem List   Diagnosis Date Noted  . Palliative care encounter   . Malnutrition of moderate degree 05/02/2017  . Pressure injury of skin 05/01/2017  . Syncope and collapse 04/30/2017  . Right-sided thoracic back pain 03/26/2017  . Insomnia 03/26/2017  . Lymphedema of left arm 03/26/2017  . Malignant lymphoplasmacytic lymphoma (HCMarion11/04/2017  . Counseling regarding advanced care planning and goals of care 03/24/2017  . Pain management contract signed 03/10/2017  . Chronic generalized pain disorder 03/04/2017  . Light chain (AL) amyloidosis (HCC)   . Nephrotic syndrome   . Proteinuria   . Dyspnea   . Left arm swelling   . Acute on chronic diastolic CHF (congestive heart failure) (HCAlexis  . Elevated brain natriuretic peptide (BNP) level   . Acute on chronic diastolic (congestive) heart failure (HCYellow Springs10/04/2017  . Hypotension 02/21/2017  . Fluid overload 02/21/2017  . Mediastinal lymphadenopathy 02/10/2017  . Adjustment disorder with depressed mood 12/03/2016  . Bacteremia due to Streptococcus pneumoniae 11/19/2016  . Sepsis (HCLake Arthur07/02/2017  .  Hypoalbuminemia 11/19/2016  . Right leg pain 11/19/2016  . AKI (acute kidney injury) ([place]) 11/19/2016  . Acute urinary retention 11/19/2016  . Hyponatremia 11/18/2016  . Pneumonia 11/18/2016  . HCAP (healthcare-associated pneumonia) 11/18/2016  . History of prostate cancer 11/15/2016  . Chronic post-operative pain 11/15/2016  . Persistent atrial fibrillation ([place])   . Acute on chronic combined systolic and diastolic CHF (congestive heart failure) ([place])   . Pulmonary HTN ([place])   . Dilated aortic root ([place])   . Septic shock ([place])   . Pedal edema   . Acute respiratory failure with  hypoxia ([place]) 05/21/2016  . Lung mass 05/21/2016  . Loculated pleural effusion 05/21/2016  . Empyema ([place]) 05/21/2016  . S/P thoracentesis   . Bilateral pleural effusion 05/14/2016  . COPD mixed type ([place]) 05/14/2016  . Tobacco abuse 05/14/2016  . Protein-calorie malnutrition, severe ([place]) 05/14/2016    Past Surgical History:  Procedure Laterality Date  . CARDIOVERSION N/A 10/11/2016   Procedure: CARDIOVERSION;  Surgeon: Sanda Klein, MD;  Location: [facility];  Service: Cardiovascular;  Laterality: N/A;  . CARDIOVERSION N/A 02/26/2017   Procedure: CARDIOVERSION;  Surgeon: [name], MD;  Location: [facility]COPY;  Service: Cardiovascular;  Laterality: N/A;  . COLONOSCOPY    . EXCISIONAL HEMORRHOIDECTOMY  1960s  . INGUINAL HERNIA REPAIR Right 2002  . INGUINAL HERNIA REPAIR Bilateral 08/15/2015  . INGUINAL HERNIA REPAIR Bilateral 08/15/2015   Procedure: OPEN REPAIR RECURRENT RIGHT INGUINAL HERNIA WITH MESH AND REPAIR LEFT INGUINAL HERNIA WITH MESH;  Surgeon: [name], MD;  Location: [facility];  Service: General;  Laterality: Bilateral;  . INSERTION OF MESH Bilateral 08/15/2015   Procedure: INSERTION OF MESH;  Surgeon: [name], MD;  Location: [employer] [facility];  Service: General;  Laterality: Bilateral;  . IR THORACENTESIS ASP PLEURAL SPACE W/IMG GUIDE  11/18/2016  . LYMPH NODE BIOPSY Bilateral 10/22/2016   Procedure: LEFT NECK LYMPH NODE BIOPSY;  Surgeon: [name], MD;  Location: [facility];  Service: Thoracic;  Laterality: Bilateral;  . PROSTATE BIOPSY    . TONSILLECTOMY  1950s  . VIDEO ASSISTED THORACOSCOPY (VATS)/EMPYEMA Right 05/20/2016   Procedure: VIDEO ASSISTED THORACOSCOPY (VATS) with drainage of pleural effusion;  Surgeon: [name], MD;  Location: [facility];  Service: Thoracic;  Laterality: Right;  [name] VIDEO BRONCHOSCOPY WITH ENDOBRONCHIAL ULTRASOUND Right 05/20/2016   Procedure: VIDEO BRONCHOSCOPY WITH ENDOBRONCHIAL ULTRASOUND;  Surgeon: [name], MD;  Location: [facility];   Service: Thoracic;  Laterality: Right;       Home Medications    Prior to Admission medications   Medication Sig Start Date End Date Taking? Authorizing Provider  acyclovir (ZOVIRAX) 400 MG tablet Take 1 tablet (400 mg total) daily by mouth. 03/24/17   Brunetta Genera, MD  dexamethasone (DECADRON) 4 MG tablet Take 2 tablets (8 mg total) daily by mouth. Start the day after bendamustine chemotherapy for 2 days. Take with food. Patient not taking: Reported on 04/30/2017 03/24/17   Brunetta Genera, MD  diazepam (VALIUM) 5 MG tablet 1 by mouth prior to MRI Patient not taking: Reported on 04/25/2017 04/10/17   Biagio Borg, MD  docusate sodium (COLACE) 100 MG capsule Take 1-3 capsules (100-300 mg total) by mouth daily. 03/04/17   Binnie Rail, MD  feeding supplement (BOOST / RESOURCE BREEZE) LIQD Take 1 Container by mouth 3 (three) times daily between meals. 03/02/17   Kayleen Memos, DO  furosemide (LASIX) 40 MG tablet Take 1 tablet (40 mg total)  by mouth daily. 05/23/17   [name], MD  HYDROcodone-acetaminophen [facility]) 10-325 MG tablet Take 1 tablet by mouth every 6 (six) hours as needed. 05/14/17   Biagio Borg, MD  hydrocortisone cream 1 % Apply 1 application topically 4 (four) times daily as needed for itching. Patient not taking: Reported on 04/25/2017 03/02/17   Kayleen Memos, DO  midodrine (PROAMATINE) 10 MG tablet Take 1 tablet (10 mg total) by mouth 3 (three) times daily with meals. 05/12/17   Charlynne Cousins, MD  nystatin cream (MYCOSTATIN) APPLY EXTERNALLY TO THE AFFECTED AREA TWICE DAILY 05/19/17   Biagio Borg, MD  ondansetron (ZOFRAN) 8 MG tablet Take 1 tablet (8 mg total) 2 (two) times daily as needed by mouth for refractory nausea / vomiting. Start on day 2 after bendamustine chemo. Patient not taking: Reported on 04/25/2017 03/24/17   Brunetta Genera, MD  polyethylene glycol [facility] / Floria Raveling) packet Take 17 g by mouth daily as needed. Patient not  taking: Reported on 04/25/2017 12/30/16   Nche, Charlene Brooke, NP  prochlorperazine (COMPAZINE) 10 MG tablet Take 1 tablet (10 mg total) every 6 (six) hours as needed by mouth (Nausea or vomiting). Patient not taking: Reported on 04/25/2017 03/24/17   Brunetta Genera, MD  rivaroxaban (XARELTO) 20 MG TABS tablet Take 1 tablet (20 mg total) by mouth daily with supper. 01/14/17   [name], MD  senna-docusate (SENNA S) 8.6-50 MG tablet Take 2 tablets by mouth 2 (two) times daily. Reduce to 2 tab po HS once constipation resolved 04/02/17   Brunetta Genera, MD  traZODone (DESYREL) 50 MG tablet Take 0.5-1 tablets (25-50 mg total) by mouth at bedtime as needed for sleep. 05/19/17   Biagio Borg, MD    Family History Family History  Problem Relation Age of Onset  . Colon cancer Mother   . Ovarian cancer Mother   . Alcoholism Father   . CAD Father   . Alcohol abuse Father   . Ovarian cancer Sister   . Diabetes Neg Hx   . Stomach cancer Neg Hx     Social History Social History   Tobacco Use  . Smoking status: Former Smoker    Packs/day: 1.00    Years: 62.00    Pack years: 62.00    Types: Cigarettes    Last attempt to quit: 05/14/2016    Years since quitting: 1.0  . Smokeless tobacco: Never Used  Substance Use Topics  . Alcohol use: No    Alcohol/week: 0.0 oz  . Drug use: No    Comment: CBD OIL     Allergies   Demerol [meperidine] and Oxycodone hcl   Review of Systems Review of Systems  All other systems reviewed and are negative.    Physical Exam Updated Vital Signs There were no vitals taken for this visit.  Physical Exam  Constitutional: He is oriented to person, place, and time. He appears well-developed and well-nourished.  HENT:  Head: Normocephalic and atraumatic.  No signs of trauma  Eyes: Conjunctivae are normal. Pupils are equal, round, and reactive to light. Right eye exhibits no discharge. Left eye exhibits no discharge. No scleral icterus.    Neck: Normal range of motion. No JVD present. No tracheal deviation present.  Pulmonary/Chest: Effort normal. No stridor.  No pain with AP lateral compression of the chest  Musculoskeletal:  No CT or L-spine tenderness to palpation.  Right hip tender along the lateral aspect, range of  motion intact without significant discomfort, minor tenderness to palpation of the right lateral calf, no obvious signs of trauma full active range of motion of the knee distal sensation intact chronic edema to the lower extremity  Bilateral upper extremities nontender to palpation with chronic edema  Neurological: He is alert and oriented to person, place, and time. Coordination normal.  Psychiatric: He has a normal mood and affect. His behavior is normal. Judgment and thought content normal.  Nursing note and vitals reviewed.    ED Treatments / Results  Labs (all labs ordered are listed, but only abnormal results are displayed) Labs Reviewed - No data to display  EKG  EKG Interpretation None       Radiology Dg Hip Unilat W Or Wo Pelvis 2-3 Views Right  Result Date: 05/23/2017 CLINICAL DATA:  Right hip pain after fall. EXAM: DG HIP (WITH OR WITHOUT PELVIS) 2-3V RIGHT COMPARISON:  CT abdomen pelvis dated April 25, 2017. FINDINGS: No acute fracture or malalignment. Hip joint spaces are relatively preserved. The sacroiliac joints and pubic symphysis are intact. Atherosclerotic vascular calcifications. IMPRESSION: No acute osseous abnormality. If occult hip fracture is suspected or if the patient is unable to bear weight, MRI is the preferred modality for further evaluation. Electronically Signed   By: Titus Dubin M.D.   On: 05/23/2017 15:13    Procedures Procedures (including critical care time)  Medications Ordered in ED Medications  HYDROcodone-acetaminophen (NORCO/VICODIN) 5-325 MG per tablet 1 tablet (not administered)     Initial Impression / Assessment and Plan / ED Course  I have  reviewed the triage vital signs and the nursing notes.  Pertinent labs & imaging results that were available during my care of the patient were reviewed by me and considered in my medical decision making (see chart for details).     Final Clinical Impressions(s) / ED Diagnoses   Final diagnoses:  Fall, initial encounter  Pain of right hip joint    Labs:   Imaging:  Consults:  Therapeutics: Norco  Discharge Meds:   Assessment/Plan: 72 year old male presents status post fall.  Patient with right-sided hip pain.  His plain films showed no acute abnormality.  I personally ambulated the patient in the room, he was able to march in place without pain at the hip, have low suspicion for occult fracture.  Patient without any other acute injuries, he will be discharged and return back to the cancer center for ongoing treatment.  Patient denies any other acute complaints today.  He is given strict return precautions, he verbalized understanding and agreement to today's plan.      ED Discharge Orders    None       Okey Regal, Hershal Coria 05/23/17 1541    Blanchie Dessert, MD 05/23/17 1544

## 2017-05-23 NOTE — ED Triage Notes (Signed)
Pt comes from cancer center after complaints of falling this morning. Complains of right hip pain with trouble bearing weight. A&O x4. If finished before 1800 he needs to go back to cancer center for chemo injection.

## 2017-05-23 NOTE — Telephone Encounter (Signed)
Called and spoke with patient's wife this morning.  She will restart lasix 40 mg daily.  She asked if [facility] could draw his labs next week because its difficult for her to transport him.   I will contact you [facility] and have them draw bmet next week. Medication ordered.

## 2017-05-23 NOTE — Discharge Instructions (Signed)
Please read attached information. If you experience any new or worsening signs or symptoms please return to the emergency room for evaluation. Please follow-up with your primary care provider or specialist as discussed.

## 2017-05-23 NOTE — Patient Instructions (Signed)
[facility] Discharge Instructions for Patients Receiving Chemotherapy  Today you received the following chemotherapy agents [place].  To help prevent nausea and vomiting after your treatment, we encourage you to take your nausea medication as directed.   If you develop nausea and vomiting that is not controlled by your nausea medication, call the clinic.   BELOW ARE SYMPTOMS THAT SHOULD BE REPORTED IMMEDIATELY:  *FEVER GREATER THAN 100.5 F  *CHILLS WITH OR WITHOUT FEVER  NAUSEA AND VOMITING THAT IS NOT CONTROLLED WITH YOUR NAUSEA MEDICATION  *UNUSUAL SHORTNESS OF BREATH  *UNUSUAL BRUISING OR BLEEDING  TENDERNESS IN MOUTH AND THROAT WITH OR WITHOUT PRESENCE OF ULCERS  *URINARY PROBLEMS  *BOWEL PROBLEMS  UNUSUAL RASH Items with * indicate a potential emergency and should be followed up as soon as possible.  Feel free to call the clinic should you have any questions or concerns. The clinic phone number is [phone].  Please show the CHEMO ALERT CARD at check-in to the Emergency Department and triage nurse.   

## 2017-05-26 DIAGNOSIS — C880 Waldenstrom macroglobulinemia: Secondary | ICD-10-CM | POA: Diagnosis not present

## 2017-05-26 DIAGNOSIS — C9190 Lymphoid leukemia, unspecified not having achieved remission: Secondary | ICD-10-CM | POA: Diagnosis not present

## 2017-05-26 DIAGNOSIS — I481 Persistent atrial fibrillation: Secondary | ICD-10-CM | POA: Diagnosis not present

## 2017-05-26 DIAGNOSIS — E43 Unspecified severe protein-calorie malnutrition: Secondary | ICD-10-CM | POA: Diagnosis not present

## 2017-05-26 DIAGNOSIS — I5042 Chronic combined systolic (congestive) and diastolic (congestive) heart failure: Secondary | ICD-10-CM | POA: Diagnosis not present

## 2017-05-26 DIAGNOSIS — I951 Orthostatic hypotension: Secondary | ICD-10-CM | POA: Diagnosis not present

## 2017-05-26 DIAGNOSIS — M1990 Unspecified osteoarthritis, unspecified site: Secondary | ICD-10-CM | POA: Diagnosis not present

## 2017-05-26 DIAGNOSIS — I739 Peripheral vascular disease, unspecified: Secondary | ICD-10-CM | POA: Diagnosis not present

## 2017-05-26 DIAGNOSIS — J439 Emphysema, unspecified: Secondary | ICD-10-CM | POA: Diagnosis not present

## 2017-05-26 DIAGNOSIS — I110 Hypertensive heart disease with heart failure: Secondary | ICD-10-CM | POA: Diagnosis not present

## 2017-05-26 DIAGNOSIS — M5030 Other cervical disc degeneration, unspecified cervical region: Secondary | ICD-10-CM | POA: Diagnosis not present

## 2017-05-26 LAB — MULTIPLE MYELOMA PANEL, SERUM
ALBUMIN SERPL ELPH-MCNC: 1.9 g/dL — AB (ref 2.9–4.4)
Albumin/Glob SerPl: 0.8 (ref 0.7–1.7)
Alpha 1: 0.3 g/dL (ref 0.0–0.4)
Alpha2 Glob SerPl Elph-Mcnc: 0.7 g/dL (ref 0.4–1.0)
B-GLOBULIN SERPL ELPH-MCNC: 0.7 g/dL (ref 0.7–1.3)
GAMMA GLOB SERPL ELPH-MCNC: 1 g/dL (ref 0.4–1.8)
Globulin, Total: 2.7 g/dL (ref 2.2–3.9)
IGA: 61 mg/dL (ref 61–437)
IgG (Immunoglobin G), Serum: 348 mg/dL — ABNORMAL LOW (ref 700–1600)
IgM (Immunoglobulin M), Srm: 1103 mg/dL — ABNORMAL HIGH (ref 15–143)
M Protein SerPl Elph-Mcnc: 0.7 g/dL — ABNORMAL HIGH
TOTAL PROTEIN ELP: 4.6 g/dL — AB (ref 6.0–8.5)

## 2017-05-27 DIAGNOSIS — J449 Chronic obstructive pulmonary disease, unspecified: Secondary | ICD-10-CM | POA: Diagnosis not present

## 2017-05-27 DIAGNOSIS — I951 Orthostatic hypotension: Secondary | ICD-10-CM | POA: Diagnosis not present

## 2017-05-27 DIAGNOSIS — C880 Waldenstrom macroglobulinemia: Secondary | ICD-10-CM | POA: Diagnosis not present

## 2017-05-27 DIAGNOSIS — C9190 Lymphoid leukemia, unspecified not having achieved remission: Secondary | ICD-10-CM | POA: Diagnosis not present

## 2017-05-27 DIAGNOSIS — I739 Peripheral vascular disease, unspecified: Secondary | ICD-10-CM | POA: Diagnosis not present

## 2017-05-27 DIAGNOSIS — I5042 Chronic combined systolic (congestive) and diastolic (congestive) heart failure: Secondary | ICD-10-CM | POA: Diagnosis not present

## 2017-05-27 DIAGNOSIS — I481 Persistent atrial fibrillation: Secondary | ICD-10-CM | POA: Diagnosis not present

## 2017-05-27 DIAGNOSIS — E43 Unspecified severe protein-calorie malnutrition: Secondary | ICD-10-CM | POA: Diagnosis not present

## 2017-05-27 DIAGNOSIS — M1990 Unspecified osteoarthritis, unspecified site: Secondary | ICD-10-CM | POA: Diagnosis not present

## 2017-05-27 DIAGNOSIS — J439 Emphysema, unspecified: Secondary | ICD-10-CM | POA: Diagnosis not present

## 2017-05-27 DIAGNOSIS — M5030 Other cervical disc degeneration, unspecified cervical region: Secondary | ICD-10-CM | POA: Diagnosis not present

## 2017-05-27 DIAGNOSIS — I110 Hypertensive heart disease with heart failure: Secondary | ICD-10-CM | POA: Diagnosis not present

## 2017-05-28 DIAGNOSIS — J439 Emphysema, unspecified: Secondary | ICD-10-CM | POA: Diagnosis not present

## 2017-05-28 DIAGNOSIS — I5042 Chronic combined systolic (congestive) and diastolic (congestive) heart failure: Secondary | ICD-10-CM | POA: Diagnosis not present

## 2017-05-28 DIAGNOSIS — E43 Unspecified severe protein-calorie malnutrition: Secondary | ICD-10-CM | POA: Diagnosis not present

## 2017-05-28 DIAGNOSIS — I110 Hypertensive heart disease with heart failure: Secondary | ICD-10-CM | POA: Diagnosis not present

## 2017-05-28 DIAGNOSIS — M1990 Unspecified osteoarthritis, unspecified site: Secondary | ICD-10-CM | POA: Diagnosis not present

## 2017-05-28 DIAGNOSIS — M5030 Other cervical disc degeneration, unspecified cervical region: Secondary | ICD-10-CM | POA: Diagnosis not present

## 2017-05-28 DIAGNOSIS — C9190 Lymphoid leukemia, unspecified not having achieved remission: Secondary | ICD-10-CM | POA: Diagnosis not present

## 2017-05-28 DIAGNOSIS — I951 Orthostatic hypotension: Secondary | ICD-10-CM | POA: Diagnosis not present

## 2017-05-28 DIAGNOSIS — C880 Waldenstrom macroglobulinemia: Secondary | ICD-10-CM | POA: Diagnosis not present

## 2017-05-28 DIAGNOSIS — I481 Persistent atrial fibrillation: Secondary | ICD-10-CM | POA: Diagnosis not present

## 2017-05-28 DIAGNOSIS — I739 Peripheral vascular disease, unspecified: Secondary | ICD-10-CM | POA: Diagnosis not present

## 2017-05-29 DIAGNOSIS — M1990 Unspecified osteoarthritis, unspecified site: Secondary | ICD-10-CM | POA: Diagnosis not present

## 2017-05-29 DIAGNOSIS — I110 Hypertensive heart disease with heart failure: Secondary | ICD-10-CM | POA: Diagnosis not present

## 2017-05-29 DIAGNOSIS — I5042 Chronic combined systolic (congestive) and diastolic (congestive) heart failure: Secondary | ICD-10-CM | POA: Diagnosis not present

## 2017-05-29 DIAGNOSIS — I481 Persistent atrial fibrillation: Secondary | ICD-10-CM | POA: Diagnosis not present

## 2017-05-29 DIAGNOSIS — E43 Unspecified severe protein-calorie malnutrition: Secondary | ICD-10-CM | POA: Diagnosis not present

## 2017-05-29 DIAGNOSIS — I951 Orthostatic hypotension: Secondary | ICD-10-CM | POA: Diagnosis not present

## 2017-05-29 DIAGNOSIS — C880 Waldenstrom macroglobulinemia: Secondary | ICD-10-CM | POA: Diagnosis not present

## 2017-05-29 DIAGNOSIS — I739 Peripheral vascular disease, unspecified: Secondary | ICD-10-CM | POA: Diagnosis not present

## 2017-05-29 DIAGNOSIS — M5030 Other cervical disc degeneration, unspecified cervical region: Secondary | ICD-10-CM | POA: Diagnosis not present

## 2017-05-29 DIAGNOSIS — C9190 Lymphoid leukemia, unspecified not having achieved remission: Secondary | ICD-10-CM | POA: Diagnosis not present

## 2017-05-29 DIAGNOSIS — J439 Emphysema, unspecified: Secondary | ICD-10-CM | POA: Diagnosis not present

## 2017-05-30 DIAGNOSIS — I5042 Chronic combined systolic (congestive) and diastolic (congestive) heart failure: Secondary | ICD-10-CM | POA: Diagnosis not present

## 2017-05-30 DIAGNOSIS — Z7689 Persons encountering health services in other specified circumstances: Secondary | ICD-10-CM | POA: Diagnosis not present

## 2017-05-30 DIAGNOSIS — I951 Orthostatic hypotension: Secondary | ICD-10-CM | POA: Diagnosis not present

## 2017-05-30 DIAGNOSIS — I110 Hypertensive heart disease with heart failure: Secondary | ICD-10-CM | POA: Diagnosis not present

## 2017-05-30 DIAGNOSIS — J439 Emphysema, unspecified: Secondary | ICD-10-CM | POA: Diagnosis not present

## 2017-05-30 DIAGNOSIS — I739 Peripheral vascular disease, unspecified: Secondary | ICD-10-CM | POA: Diagnosis not present

## 2017-05-30 DIAGNOSIS — C9190 Lymphoid leukemia, unspecified not having achieved remission: Secondary | ICD-10-CM | POA: Diagnosis not present

## 2017-05-30 DIAGNOSIS — M5030 Other cervical disc degeneration, unspecified cervical region: Secondary | ICD-10-CM | POA: Diagnosis not present

## 2017-05-30 DIAGNOSIS — M1990 Unspecified osteoarthritis, unspecified site: Secondary | ICD-10-CM | POA: Diagnosis not present

## 2017-05-30 DIAGNOSIS — I481 Persistent atrial fibrillation: Secondary | ICD-10-CM | POA: Diagnosis not present

## 2017-05-30 DIAGNOSIS — C880 Waldenstrom macroglobulinemia: Secondary | ICD-10-CM | POA: Diagnosis not present

## 2017-05-30 DIAGNOSIS — E43 Unspecified severe protein-calorie malnutrition: Secondary | ICD-10-CM | POA: Diagnosis not present

## 2017-06-02 DIAGNOSIS — M5030 Other cervical disc degeneration, unspecified cervical region: Secondary | ICD-10-CM | POA: Diagnosis not present

## 2017-06-02 DIAGNOSIS — Z9181 History of falling: Secondary | ICD-10-CM | POA: Diagnosis not present

## 2017-06-02 DIAGNOSIS — C9190 Lymphoid leukemia, unspecified not having achieved remission: Secondary | ICD-10-CM | POA: Diagnosis not present

## 2017-06-02 DIAGNOSIS — M1990 Unspecified osteoarthritis, unspecified site: Secondary | ICD-10-CM | POA: Diagnosis not present

## 2017-06-02 DIAGNOSIS — I110 Hypertensive heart disease with heart failure: Secondary | ICD-10-CM | POA: Diagnosis not present

## 2017-06-02 DIAGNOSIS — C880 Waldenstrom macroglobulinemia: Secondary | ICD-10-CM | POA: Diagnosis not present

## 2017-06-02 DIAGNOSIS — E43 Unspecified severe protein-calorie malnutrition: Secondary | ICD-10-CM | POA: Diagnosis not present

## 2017-06-02 DIAGNOSIS — I5042 Chronic combined systolic (congestive) and diastolic (congestive) heart failure: Secondary | ICD-10-CM | POA: Diagnosis not present

## 2017-06-02 DIAGNOSIS — Z7901 Long term (current) use of anticoagulants: Secondary | ICD-10-CM | POA: Diagnosis not present

## 2017-06-02 DIAGNOSIS — I272 Pulmonary hypertension, unspecified: Secondary | ICD-10-CM | POA: Diagnosis not present

## 2017-06-02 DIAGNOSIS — I951 Orthostatic hypotension: Secondary | ICD-10-CM | POA: Diagnosis not present

## 2017-06-02 DIAGNOSIS — I481 Persistent atrial fibrillation: Secondary | ICD-10-CM | POA: Diagnosis not present

## 2017-06-02 DIAGNOSIS — J9611 Chronic respiratory failure with hypoxia: Secondary | ICD-10-CM | POA: Diagnosis not present

## 2017-06-02 DIAGNOSIS — J439 Emphysema, unspecified: Secondary | ICD-10-CM | POA: Diagnosis not present

## 2017-06-02 DIAGNOSIS — Z9981 Dependence on supplemental oxygen: Secondary | ICD-10-CM | POA: Diagnosis not present

## 2017-06-02 DIAGNOSIS — I739 Peripheral vascular disease, unspecified: Secondary | ICD-10-CM | POA: Diagnosis not present

## 2017-06-03 ENCOUNTER — Telehealth (HOSPITAL_COMMUNITY): Payer: Self-pay | Admitting: *Deleted

## 2017-06-03 DIAGNOSIS — I272 Pulmonary hypertension, unspecified: Secondary | ICD-10-CM | POA: Diagnosis not present

## 2017-06-03 DIAGNOSIS — Z7901 Long term (current) use of anticoagulants: Secondary | ICD-10-CM | POA: Diagnosis not present

## 2017-06-03 DIAGNOSIS — J9611 Chronic respiratory failure with hypoxia: Secondary | ICD-10-CM | POA: Diagnosis not present

## 2017-06-03 DIAGNOSIS — I739 Peripheral vascular disease, unspecified: Secondary | ICD-10-CM | POA: Diagnosis not present

## 2017-06-03 DIAGNOSIS — J439 Emphysema, unspecified: Secondary | ICD-10-CM | POA: Diagnosis not present

## 2017-06-03 DIAGNOSIS — E43 Unspecified severe protein-calorie malnutrition: Secondary | ICD-10-CM | POA: Diagnosis not present

## 2017-06-03 DIAGNOSIS — C880 Waldenstrom macroglobulinemia: Secondary | ICD-10-CM | POA: Diagnosis not present

## 2017-06-03 DIAGNOSIS — M5030 Other cervical disc degeneration, unspecified cervical region: Secondary | ICD-10-CM | POA: Diagnosis not present

## 2017-06-03 DIAGNOSIS — I951 Orthostatic hypotension: Secondary | ICD-10-CM | POA: Diagnosis not present

## 2017-06-03 DIAGNOSIS — C9190 Lymphoid leukemia, unspecified not having achieved remission: Secondary | ICD-10-CM | POA: Diagnosis not present

## 2017-06-03 DIAGNOSIS — M1990 Unspecified osteoarthritis, unspecified site: Secondary | ICD-10-CM | POA: Diagnosis not present

## 2017-06-03 DIAGNOSIS — Z9981 Dependence on supplemental oxygen: Secondary | ICD-10-CM | POA: Diagnosis not present

## 2017-06-03 DIAGNOSIS — I110 Hypertensive heart disease with heart failure: Secondary | ICD-10-CM | POA: Diagnosis not present

## 2017-06-03 DIAGNOSIS — Z9181 History of falling: Secondary | ICD-10-CM | POA: Diagnosis not present

## 2017-06-03 DIAGNOSIS — I481 Persistent atrial fibrillation: Secondary | ICD-10-CM | POA: Diagnosis not present

## 2017-06-03 DIAGNOSIS — I5042 Chronic combined systolic (congestive) and diastolic (congestive) heart failure: Secondary | ICD-10-CM | POA: Diagnosis not present

## 2017-06-03 NOTE — Telephone Encounter (Signed)
Patient's wife called asking about lab results that home health obtained last week.  Lab results given and were all stable with no further changes.

## 2017-06-04 DIAGNOSIS — E43 Unspecified severe protein-calorie malnutrition: Secondary | ICD-10-CM | POA: Diagnosis not present

## 2017-06-04 DIAGNOSIS — I5042 Chronic combined systolic (congestive) and diastolic (congestive) heart failure: Secondary | ICD-10-CM | POA: Diagnosis not present

## 2017-06-04 DIAGNOSIS — J439 Emphysema, unspecified: Secondary | ICD-10-CM | POA: Diagnosis not present

## 2017-06-04 DIAGNOSIS — I272 Pulmonary hypertension, unspecified: Secondary | ICD-10-CM | POA: Diagnosis not present

## 2017-06-04 DIAGNOSIS — J9611 Chronic respiratory failure with hypoxia: Secondary | ICD-10-CM | POA: Diagnosis not present

## 2017-06-04 DIAGNOSIS — M1990 Unspecified osteoarthritis, unspecified site: Secondary | ICD-10-CM | POA: Diagnosis not present

## 2017-06-04 DIAGNOSIS — C880 Waldenstrom macroglobulinemia: Secondary | ICD-10-CM | POA: Diagnosis not present

## 2017-06-04 DIAGNOSIS — Z9981 Dependence on supplemental oxygen: Secondary | ICD-10-CM | POA: Diagnosis not present

## 2017-06-04 DIAGNOSIS — C9190 Lymphoid leukemia, unspecified not having achieved remission: Secondary | ICD-10-CM | POA: Diagnosis not present

## 2017-06-04 DIAGNOSIS — Z7901 Long term (current) use of anticoagulants: Secondary | ICD-10-CM | POA: Diagnosis not present

## 2017-06-04 DIAGNOSIS — Z9181 History of falling: Secondary | ICD-10-CM | POA: Diagnosis not present

## 2017-06-04 DIAGNOSIS — I739 Peripheral vascular disease, unspecified: Secondary | ICD-10-CM | POA: Diagnosis not present

## 2017-06-04 DIAGNOSIS — I951 Orthostatic hypotension: Secondary | ICD-10-CM | POA: Diagnosis not present

## 2017-06-04 DIAGNOSIS — M5030 Other cervical disc degeneration, unspecified cervical region: Secondary | ICD-10-CM | POA: Diagnosis not present

## 2017-06-04 DIAGNOSIS — I110 Hypertensive heart disease with heart failure: Secondary | ICD-10-CM | POA: Diagnosis not present

## 2017-06-04 DIAGNOSIS — I481 Persistent atrial fibrillation: Secondary | ICD-10-CM | POA: Diagnosis not present

## 2017-06-05 DIAGNOSIS — E43 Unspecified severe protein-calorie malnutrition: Secondary | ICD-10-CM | POA: Diagnosis not present

## 2017-06-05 DIAGNOSIS — Z7901 Long term (current) use of anticoagulants: Secondary | ICD-10-CM

## 2017-06-05 DIAGNOSIS — C9190 Lymphoid leukemia, unspecified not having achieved remission: Secondary | ICD-10-CM | POA: Diagnosis not present

## 2017-06-05 DIAGNOSIS — M1990 Unspecified osteoarthritis, unspecified site: Secondary | ICD-10-CM

## 2017-06-05 DIAGNOSIS — I272 Pulmonary hypertension, unspecified: Secondary | ICD-10-CM

## 2017-06-05 DIAGNOSIS — I739 Peripheral vascular disease, unspecified: Secondary | ICD-10-CM | POA: Diagnosis not present

## 2017-06-05 DIAGNOSIS — M5030 Other cervical disc degeneration, unspecified cervical region: Secondary | ICD-10-CM | POA: Diagnosis not present

## 2017-06-05 DIAGNOSIS — J9611 Chronic respiratory failure with hypoxia: Secondary | ICD-10-CM

## 2017-06-05 DIAGNOSIS — F329 Major depressive disorder, single episode, unspecified: Secondary | ICD-10-CM | POA: Diagnosis not present

## 2017-06-05 DIAGNOSIS — I110 Hypertensive heart disease with heart failure: Secondary | ICD-10-CM | POA: Diagnosis not present

## 2017-06-05 DIAGNOSIS — Z9981 Dependence on supplemental oxygen: Secondary | ICD-10-CM

## 2017-06-05 DIAGNOSIS — I481 Persistent atrial fibrillation: Secondary | ICD-10-CM | POA: Diagnosis not present

## 2017-06-05 DIAGNOSIS — I5042 Chronic combined systolic (congestive) and diastolic (congestive) heart failure: Secondary | ICD-10-CM | POA: Diagnosis not present

## 2017-06-05 DIAGNOSIS — C880 Waldenstrom macroglobulinemia: Secondary | ICD-10-CM | POA: Diagnosis not present

## 2017-06-05 DIAGNOSIS — F419 Anxiety disorder, unspecified: Secondary | ICD-10-CM | POA: Diagnosis not present

## 2017-06-05 DIAGNOSIS — Z8546 Personal history of malignant neoplasm of prostate: Secondary | ICD-10-CM

## 2017-06-05 DIAGNOSIS — Z9181 History of falling: Secondary | ICD-10-CM

## 2017-06-05 DIAGNOSIS — I951 Orthostatic hypotension: Secondary | ICD-10-CM | POA: Diagnosis not present

## 2017-06-05 DIAGNOSIS — J439 Emphysema, unspecified: Secondary | ICD-10-CM | POA: Diagnosis not present

## 2017-06-06 ENCOUNTER — Other Ambulatory Visit (HOSPITAL_COMMUNITY): Payer: Self-pay | Admitting: Internal Medicine

## 2017-06-06 DIAGNOSIS — I951 Orthostatic hypotension: Secondary | ICD-10-CM | POA: Diagnosis not present

## 2017-06-06 DIAGNOSIS — Z7901 Long term (current) use of anticoagulants: Secondary | ICD-10-CM | POA: Diagnosis not present

## 2017-06-06 DIAGNOSIS — J9611 Chronic respiratory failure with hypoxia: Secondary | ICD-10-CM | POA: Diagnosis not present

## 2017-06-06 DIAGNOSIS — I5042 Chronic combined systolic (congestive) and diastolic (congestive) heart failure: Secondary | ICD-10-CM | POA: Diagnosis not present

## 2017-06-06 DIAGNOSIS — I110 Hypertensive heart disease with heart failure: Secondary | ICD-10-CM | POA: Diagnosis not present

## 2017-06-06 DIAGNOSIS — C9190 Lymphoid leukemia, unspecified not having achieved remission: Secondary | ICD-10-CM | POA: Diagnosis not present

## 2017-06-06 DIAGNOSIS — M1990 Unspecified osteoarthritis, unspecified site: Secondary | ICD-10-CM | POA: Diagnosis not present

## 2017-06-06 DIAGNOSIS — E43 Unspecified severe protein-calorie malnutrition: Secondary | ICD-10-CM | POA: Diagnosis not present

## 2017-06-06 DIAGNOSIS — I481 Persistent atrial fibrillation: Secondary | ICD-10-CM | POA: Diagnosis not present

## 2017-06-06 DIAGNOSIS — J439 Emphysema, unspecified: Secondary | ICD-10-CM | POA: Diagnosis not present

## 2017-06-06 DIAGNOSIS — C880 Waldenstrom macroglobulinemia: Secondary | ICD-10-CM | POA: Diagnosis not present

## 2017-06-06 DIAGNOSIS — Z9981 Dependence on supplemental oxygen: Secondary | ICD-10-CM | POA: Diagnosis not present

## 2017-06-06 DIAGNOSIS — I739 Peripheral vascular disease, unspecified: Secondary | ICD-10-CM | POA: Diagnosis not present

## 2017-06-06 DIAGNOSIS — I272 Pulmonary hypertension, unspecified: Secondary | ICD-10-CM | POA: Diagnosis not present

## 2017-06-06 DIAGNOSIS — Z9181 History of falling: Secondary | ICD-10-CM | POA: Diagnosis not present

## 2017-06-06 DIAGNOSIS — M5030 Other cervical disc degeneration, unspecified cervical region: Secondary | ICD-10-CM | POA: Diagnosis not present

## 2017-06-09 ENCOUNTER — Other Ambulatory Visit (HOSPITAL_COMMUNITY): Payer: Self-pay

## 2017-06-09 DIAGNOSIS — I951 Orthostatic hypotension: Secondary | ICD-10-CM | POA: Diagnosis not present

## 2017-06-09 DIAGNOSIS — I481 Persistent atrial fibrillation: Secondary | ICD-10-CM | POA: Diagnosis not present

## 2017-06-09 DIAGNOSIS — C9190 Lymphoid leukemia, unspecified not having achieved remission: Secondary | ICD-10-CM | POA: Diagnosis not present

## 2017-06-09 DIAGNOSIS — J9611 Chronic respiratory failure with hypoxia: Secondary | ICD-10-CM | POA: Diagnosis not present

## 2017-06-09 DIAGNOSIS — Z9981 Dependence on supplemental oxygen: Secondary | ICD-10-CM | POA: Diagnosis not present

## 2017-06-09 DIAGNOSIS — M5030 Other cervical disc degeneration, unspecified cervical region: Secondary | ICD-10-CM | POA: Diagnosis not present

## 2017-06-09 DIAGNOSIS — Z7901 Long term (current) use of anticoagulants: Secondary | ICD-10-CM | POA: Diagnosis not present

## 2017-06-09 DIAGNOSIS — I5042 Chronic combined systolic (congestive) and diastolic (congestive) heart failure: Secondary | ICD-10-CM | POA: Diagnosis not present

## 2017-06-09 DIAGNOSIS — C880 Waldenstrom macroglobulinemia: Secondary | ICD-10-CM | POA: Diagnosis not present

## 2017-06-09 DIAGNOSIS — J439 Emphysema, unspecified: Secondary | ICD-10-CM | POA: Diagnosis not present

## 2017-06-09 DIAGNOSIS — Z9181 History of falling: Secondary | ICD-10-CM | POA: Diagnosis not present

## 2017-06-09 DIAGNOSIS — I272 Pulmonary hypertension, unspecified: Secondary | ICD-10-CM | POA: Diagnosis not present

## 2017-06-09 DIAGNOSIS — I739 Peripheral vascular disease, unspecified: Secondary | ICD-10-CM | POA: Diagnosis not present

## 2017-06-09 DIAGNOSIS — M1990 Unspecified osteoarthritis, unspecified site: Secondary | ICD-10-CM | POA: Diagnosis not present

## 2017-06-09 DIAGNOSIS — E43 Unspecified severe protein-calorie malnutrition: Secondary | ICD-10-CM | POA: Diagnosis not present

## 2017-06-09 DIAGNOSIS — I110 Hypertensive heart disease with heart failure: Secondary | ICD-10-CM | POA: Diagnosis not present

## 2017-06-10 ENCOUNTER — Encounter (HOSPITAL_COMMUNITY): Payer: Medicare Other | Admitting: Cardiology

## 2017-06-10 DIAGNOSIS — E43 Unspecified severe protein-calorie malnutrition: Secondary | ICD-10-CM | POA: Diagnosis not present

## 2017-06-10 DIAGNOSIS — Z9981 Dependence on supplemental oxygen: Secondary | ICD-10-CM | POA: Diagnosis not present

## 2017-06-10 DIAGNOSIS — I272 Pulmonary hypertension, unspecified: Secondary | ICD-10-CM | POA: Diagnosis not present

## 2017-06-10 DIAGNOSIS — M1990 Unspecified osteoarthritis, unspecified site: Secondary | ICD-10-CM | POA: Diagnosis not present

## 2017-06-10 DIAGNOSIS — C9190 Lymphoid leukemia, unspecified not having achieved remission: Secondary | ICD-10-CM | POA: Diagnosis not present

## 2017-06-10 DIAGNOSIS — I951 Orthostatic hypotension: Secondary | ICD-10-CM | POA: Diagnosis not present

## 2017-06-10 DIAGNOSIS — C880 Waldenstrom macroglobulinemia: Secondary | ICD-10-CM | POA: Diagnosis not present

## 2017-06-10 DIAGNOSIS — I5042 Chronic combined systolic (congestive) and diastolic (congestive) heart failure: Secondary | ICD-10-CM | POA: Diagnosis not present

## 2017-06-10 DIAGNOSIS — Z9181 History of falling: Secondary | ICD-10-CM | POA: Diagnosis not present

## 2017-06-10 DIAGNOSIS — J439 Emphysema, unspecified: Secondary | ICD-10-CM | POA: Diagnosis not present

## 2017-06-10 DIAGNOSIS — I110 Hypertensive heart disease with heart failure: Secondary | ICD-10-CM | POA: Diagnosis not present

## 2017-06-10 DIAGNOSIS — I481 Persistent atrial fibrillation: Secondary | ICD-10-CM | POA: Diagnosis not present

## 2017-06-10 DIAGNOSIS — M5030 Other cervical disc degeneration, unspecified cervical region: Secondary | ICD-10-CM | POA: Diagnosis not present

## 2017-06-10 DIAGNOSIS — J9611 Chronic respiratory failure with hypoxia: Secondary | ICD-10-CM | POA: Diagnosis not present

## 2017-06-10 DIAGNOSIS — Z7901 Long term (current) use of anticoagulants: Secondary | ICD-10-CM | POA: Diagnosis not present

## 2017-06-10 DIAGNOSIS — I739 Peripheral vascular disease, unspecified: Secondary | ICD-10-CM | POA: Diagnosis not present

## 2017-06-11 ENCOUNTER — Telehealth: Payer: Self-pay | Admitting: Internal Medicine

## 2017-06-11 DIAGNOSIS — M1990 Unspecified osteoarthritis, unspecified site: Secondary | ICD-10-CM | POA: Diagnosis not present

## 2017-06-11 DIAGNOSIS — C9190 Lymphoid leukemia, unspecified not having achieved remission: Secondary | ICD-10-CM | POA: Diagnosis not present

## 2017-06-11 DIAGNOSIS — I951 Orthostatic hypotension: Secondary | ICD-10-CM | POA: Diagnosis not present

## 2017-06-11 DIAGNOSIS — I272 Pulmonary hypertension, unspecified: Secondary | ICD-10-CM | POA: Diagnosis not present

## 2017-06-11 DIAGNOSIS — M5030 Other cervical disc degeneration, unspecified cervical region: Secondary | ICD-10-CM | POA: Diagnosis not present

## 2017-06-11 DIAGNOSIS — J9611 Chronic respiratory failure with hypoxia: Secondary | ICD-10-CM | POA: Diagnosis not present

## 2017-06-11 DIAGNOSIS — I739 Peripheral vascular disease, unspecified: Secondary | ICD-10-CM | POA: Diagnosis not present

## 2017-06-11 DIAGNOSIS — I481 Persistent atrial fibrillation: Secondary | ICD-10-CM | POA: Diagnosis not present

## 2017-06-11 DIAGNOSIS — I5042 Chronic combined systolic (congestive) and diastolic (congestive) heart failure: Secondary | ICD-10-CM | POA: Diagnosis not present

## 2017-06-11 DIAGNOSIS — Z9981 Dependence on supplemental oxygen: Secondary | ICD-10-CM | POA: Diagnosis not present

## 2017-06-11 DIAGNOSIS — Z7901 Long term (current) use of anticoagulants: Secondary | ICD-10-CM | POA: Diagnosis not present

## 2017-06-11 DIAGNOSIS — E43 Unspecified severe protein-calorie malnutrition: Secondary | ICD-10-CM | POA: Diagnosis not present

## 2017-06-11 DIAGNOSIS — C880 Waldenstrom macroglobulinemia: Secondary | ICD-10-CM | POA: Diagnosis not present

## 2017-06-11 DIAGNOSIS — J439 Emphysema, unspecified: Secondary | ICD-10-CM | POA: Diagnosis not present

## 2017-06-11 DIAGNOSIS — I110 Hypertensive heart disease with heart failure: Secondary | ICD-10-CM | POA: Diagnosis not present

## 2017-06-11 DIAGNOSIS — Z9181 History of falling: Secondary | ICD-10-CM | POA: Diagnosis not present

## 2017-06-11 NOTE — Telephone Encounter (Signed)
Copied from [place]. Topic: Quick Communication - See Telephone Encounter >> Jun 11, 2017 11:45 AM Boyd Kerbs wrote: CRM for notification. See Telephone encounter for:   Belva Chimes -[facility] - [phone] notifying doctor,  pt. Canceled appt for tomorrow with Skilled Nurse as he has appt. With Cancer center  06/11/17.

## 2017-06-12 ENCOUNTER — Ambulatory Visit (HOSPITAL_COMMUNITY)
Admission: RE | Admit: 2017-06-12 | Discharge: 2017-06-12 | Disposition: A | Discharge status: Home / Self Care | Payer: Medicare Other | Source: Ambulatory Visit | Attending: Oncology | Admitting: Oncology

## 2017-06-12 ENCOUNTER — Inpatient Hospital Stay: Payer: Medicare Other

## 2017-06-12 DIAGNOSIS — Z5112 Encounter for antineoplastic immunotherapy: Secondary | ICD-10-CM | POA: Diagnosis not present

## 2017-06-12 DIAGNOSIS — G8929 Other chronic pain: Secondary | ICD-10-CM | POA: Diagnosis not present

## 2017-06-12 DIAGNOSIS — E43 Unspecified severe protein-calorie malnutrition: Secondary | ICD-10-CM | POA: Diagnosis not present

## 2017-06-12 DIAGNOSIS — R93422 Abnormal radiologic findings on diagnostic imaging of left kidney: Secondary | ICD-10-CM | POA: Insufficient documentation

## 2017-06-12 DIAGNOSIS — J439 Emphysema, unspecified: Secondary | ICD-10-CM | POA: Insufficient documentation

## 2017-06-12 DIAGNOSIS — C8300 Small cell B-cell lymphoma, unspecified site: Secondary | ICD-10-CM

## 2017-06-12 DIAGNOSIS — R188 Other ascites: Secondary | ICD-10-CM | POA: Insufficient documentation

## 2017-06-12 DIAGNOSIS — J811 Chronic pulmonary edema: Secondary | ICD-10-CM | POA: Insufficient documentation

## 2017-06-12 DIAGNOSIS — E8770 Fluid overload, unspecified: Secondary | ICD-10-CM | POA: Insufficient documentation

## 2017-06-12 DIAGNOSIS — I2510 Atherosclerotic heart disease of native coronary artery without angina pectoris: Secondary | ICD-10-CM | POA: Insufficient documentation

## 2017-06-12 DIAGNOSIS — I959 Hypotension, unspecified: Secondary | ICD-10-CM | POA: Diagnosis not present

## 2017-06-12 DIAGNOSIS — J90 Pleural effusion, not elsewhere classified: Secondary | ICD-10-CM | POA: Diagnosis not present

## 2017-06-12 DIAGNOSIS — E8581 Light chain (AL) amyloidosis: Secondary | ICD-10-CM

## 2017-06-12 DIAGNOSIS — I700 Atherosclerosis of aorta: Secondary | ICD-10-CM | POA: Insufficient documentation

## 2017-06-12 DIAGNOSIS — Z8546 Personal history of malignant neoplasm of prostate: Secondary | ICD-10-CM

## 2017-06-12 DIAGNOSIS — D472 Monoclonal gammopathy: Secondary | ICD-10-CM | POA: Diagnosis not present

## 2017-06-12 DIAGNOSIS — I5033 Acute on chronic diastolic (congestive) heart failure: Secondary | ICD-10-CM | POA: Diagnosis not present

## 2017-06-12 DIAGNOSIS — R599 Enlarged lymph nodes, unspecified: Secondary | ICD-10-CM | POA: Insufficient documentation

## 2017-06-12 DIAGNOSIS — C880 Waldenstrom macroglobulinemia: Secondary | ICD-10-CM | POA: Diagnosis not present

## 2017-06-12 DIAGNOSIS — M549 Dorsalgia, unspecified: Secondary | ICD-10-CM | POA: Diagnosis not present

## 2017-06-12 DIAGNOSIS — R600 Localized edema: Secondary | ICD-10-CM | POA: Insufficient documentation

## 2017-06-12 DIAGNOSIS — N049 Nephrotic syndrome with unspecified morphologic changes: Secondary | ICD-10-CM | POA: Diagnosis not present

## 2017-06-12 DIAGNOSIS — Z5111 Encounter for antineoplastic chemotherapy: Secondary | ICD-10-CM | POA: Diagnosis not present

## 2017-06-12 DIAGNOSIS — R5383 Other fatigue: Secondary | ICD-10-CM | POA: Diagnosis not present

## 2017-06-12 DIAGNOSIS — R55 Syncope and collapse: Secondary | ICD-10-CM | POA: Diagnosis not present

## 2017-06-12 LAB — CBC WITH DIFFERENTIAL (CANCER CENTER ONLY)
BASOS ABS: 0.1 10*3/uL (ref 0.0–0.1)
BASOS PCT: 1 %
EOS ABS: 0.2 10*3/uL (ref 0.0–0.5)
EOS PCT: 3 %
HCT: 35.7 % — ABNORMAL LOW (ref 38.4–49.9)
Hemoglobin: 11.6 g/dL — ABNORMAL LOW (ref 13.0–17.1)
Lymphocytes Relative: 8 %
Lymphs Abs: 0.5 10*3/uL — ABNORMAL LOW (ref 0.9–3.3)
MCH: 32.6 pg (ref 27.2–33.4)
MCHC: 32.5 g/dL (ref 32.0–36.0)
MCV: 100.3 fL — AB (ref 79.3–98.0)
Monocytes Absolute: 0.2 10*3/uL (ref 0.1–0.9)
Monocytes Relative: 3 %
Neutro Abs: 4.8 10*3/uL (ref 1.5–6.5)
Neutrophils Relative %: 85 %
PLATELETS: 340 10*3/uL (ref 140–400)
RBC: 3.56 MIL/uL — ABNORMAL LOW (ref 4.20–5.82)
RDW: 20 % — AB (ref 11.0–14.6)
WBC Count: 5.7 10*3/uL (ref 4.0–10.3)

## 2017-06-12 LAB — CMP (CANCER CENTER ONLY)
ALBUMIN: 2.1 g/dL — AB (ref 3.5–5.0)
ALK PHOS: 381 U/L — AB (ref 40–150)
ALT: 17 U/L (ref 0–55)
AST: 22 U/L (ref 5–34)
Anion gap: 8 (ref 3–11)
BILIRUBIN TOTAL: 0.4 mg/dL (ref 0.2–1.2)
BUN: 32 mg/dL — AB (ref 7–26)
CALCIUM: 8.7 mg/dL (ref 8.4–10.4)
CO2: 28 mmol/L (ref 22–29)
CREATININE: 0.97 mg/dL (ref 0.70–1.30)
Chloride: 102 mmol/L (ref 98–109)
GFR, Est AFR Am: >60 mL/min (ref >60)
Glucose, Bld: 81 mg/dL (ref 70–140)
POTASSIUM: 4.4 mmol/L (ref 3.5–5.1)
Sodium: 138 mmol/L (ref 136–145)
TOTAL PROTEIN: 5.5 g/dL — AB (ref 6.4–8.3)

## 2017-06-12 LAB — RETICULOCYTES
RBC.: 3.56 MIL/uL — ABNORMAL LOW (ref 4.20–5.82)
RETIC COUNT ABSOLUTE: 46.3 10*3/uL (ref 34.8–93.9)
RETIC CT PCT: 1.3 % (ref 0.8–1.8)

## 2017-06-13 LAB — KAPPA/LAMBDA LIGHT CHAINS
KAPPA FREE LGHT CHN: 117.9 mg/L — AB (ref 3.3–19.4)
Kappa, lambda light chain ratio: 4.35 — ABNORMAL HIGH (ref 0.26–1.65)
Lambda free light chains: 27.1 mg/L — ABNORMAL HIGH (ref 5.7–26.3)

## 2017-06-16 ENCOUNTER — Telehealth: Payer: Self-pay | Admitting: Internal Medicine

## 2017-06-16 ENCOUNTER — Telehealth: Payer: Self-pay

## 2017-06-16 DIAGNOSIS — I110 Hypertensive heart disease with heart failure: Secondary | ICD-10-CM | POA: Diagnosis not present

## 2017-06-16 DIAGNOSIS — J439 Emphysema, unspecified: Secondary | ICD-10-CM | POA: Diagnosis not present

## 2017-06-16 DIAGNOSIS — I739 Peripheral vascular disease, unspecified: Secondary | ICD-10-CM | POA: Diagnosis not present

## 2017-06-16 DIAGNOSIS — Z5112 Encounter for antineoplastic immunotherapy: Secondary | ICD-10-CM | POA: Diagnosis not present

## 2017-06-16 DIAGNOSIS — I5042 Chronic combined systolic (congestive) and diastolic (congestive) heart failure: Secondary | ICD-10-CM | POA: Diagnosis not present

## 2017-06-16 DIAGNOSIS — Z5111 Encounter for antineoplastic chemotherapy: Secondary | ICD-10-CM | POA: Diagnosis not present

## 2017-06-16 DIAGNOSIS — I959 Hypotension, unspecified: Secondary | ICD-10-CM | POA: Diagnosis not present

## 2017-06-16 DIAGNOSIS — I951 Orthostatic hypotension: Secondary | ICD-10-CM | POA: Diagnosis not present

## 2017-06-16 DIAGNOSIS — I481 Persistent atrial fibrillation: Secondary | ICD-10-CM | POA: Diagnosis not present

## 2017-06-16 DIAGNOSIS — N049 Nephrotic syndrome with unspecified morphologic changes: Secondary | ICD-10-CM | POA: Diagnosis not present

## 2017-06-16 DIAGNOSIS — C880 Waldenstrom macroglobulinemia: Secondary | ICD-10-CM | POA: Diagnosis not present

## 2017-06-16 DIAGNOSIS — E43 Unspecified severe protein-calorie malnutrition: Secondary | ICD-10-CM | POA: Diagnosis not present

## 2017-06-16 DIAGNOSIS — R5383 Other fatigue: Secondary | ICD-10-CM | POA: Diagnosis not present

## 2017-06-16 DIAGNOSIS — M549 Dorsalgia, unspecified: Secondary | ICD-10-CM | POA: Diagnosis not present

## 2017-06-16 DIAGNOSIS — M5030 Other cervical disc degeneration, unspecified cervical region: Secondary | ICD-10-CM | POA: Diagnosis not present

## 2017-06-16 DIAGNOSIS — D472 Monoclonal gammopathy: Secondary | ICD-10-CM | POA: Diagnosis not present

## 2017-06-16 DIAGNOSIS — E8581 Light chain (AL) amyloidosis: Secondary | ICD-10-CM | POA: Diagnosis not present

## 2017-06-16 DIAGNOSIS — M1990 Unspecified osteoarthritis, unspecified site: Secondary | ICD-10-CM | POA: Diagnosis not present

## 2017-06-16 DIAGNOSIS — G8929 Other chronic pain: Secondary | ICD-10-CM | POA: Diagnosis not present

## 2017-06-16 DIAGNOSIS — C9190 Lymphoid leukemia, unspecified not having achieved remission: Secondary | ICD-10-CM | POA: Diagnosis not present

## 2017-06-16 LAB — MULTIPLE MYELOMA PANEL, SERUM
ALBUMIN/GLOB SERPL: 0.8 (ref 0.7–1.7)
ALPHA 1: 0.3 g/dL (ref 0.0–0.4)
ALPHA2 GLOB SERPL ELPH-MCNC: 0.8 g/dL (ref 0.4–1.0)
Albumin SerPl Elph-Mcnc: 2.1 g/dL — ABNORMAL LOW (ref 2.9–4.4)
B-GLOBULIN SERPL ELPH-MCNC: 0.8 g/dL (ref 0.7–1.3)
GLOBULIN, TOTAL: 2.7 g/dL (ref 2.2–3.9)
Gamma Glob SerPl Elph-Mcnc: 0.9 g/dL (ref 0.4–1.8)
IGM (IMMUNOGLOBULIN M), SRM: 933 mg/dL — AB (ref 15–143)
IgA: 49 mg/dL — ABNORMAL LOW (ref 61–437)
IgG (Immunoglobin G), Serum: 338 mg/dL — ABNORMAL LOW (ref 700–1600)
M PROTEIN SERPL ELPH-MCNC: 0.6 g/dL — AB
Total Protein ELP: 4.8 g/dL — ABNORMAL LOW (ref 6.0–8.5)

## 2017-06-16 MED ORDER — HYDROXYZINE HCL 10 MG PO TABS: 60.0000 mg | ORAL_TABLET | Freq: Three times a day (TID) | ORAL | 0 refills | Status: DC | PRN

## 2017-06-16 NOTE — Telephone Encounter (Signed)
Pt stated that he has been having some itching at night and has switched to hypoallergenic detergent to see if it would help. He stated that he has a lot of dead skin buildup from being in the hospital off and on over the last year. He mentioned that the only thing that helps relieve the itching is coconut oil before he goes to bed. He would like to know if it is something that he could take oralyl or maybe a topical cream that would help the itching. Please advise.    Copied from [place] [phone]. Topic: Inquiry >> Jun 16, 2017 11:06 AM Corie Chiquito, NT wrote: Reason for CRM: Patient calling because he would like to speak with Dr.Johns nurse about itching. If she could give him a call back at [phone]

## 2017-06-16 NOTE — Telephone Encounter (Signed)
Called pt, left a detailed msg with notations below.

## 2017-06-16 NOTE — Telephone Encounter (Signed)
Verbal orders given.

## 2017-06-16 NOTE — Telephone Encounter (Signed)
Hard to say the cause, as it could be dry skin, some kind of inflammatory skin condition or something else;   For now, ok for atarax 10 mg prn, I will do erx  Also consider Eucerin Cream OTC for possible dry skin

## 2017-06-16 NOTE — Addendum Note (Signed)
Addended by: Biagio Borg on: 06/16/2017 11:26 AM   Modules accepted: Orders

## 2017-06-16 NOTE — Telephone Encounter (Signed)
Copied from [place] [id number]. Topic: Inquiry >> Jun 16, 2017 11:02 AM Corie Chiquito, NT wrote: Reason for CRM: Asencion Partridge called because she would like to know if she could get verbal orders for the patient to receive home health aide  to help with his showers. If someone could give her a call back at [phone]. If no answer please leave msg

## 2017-06-17 DIAGNOSIS — I5042 Chronic combined systolic (congestive) and diastolic (congestive) heart failure: Secondary | ICD-10-CM | POA: Diagnosis not present

## 2017-06-17 DIAGNOSIS — E43 Unspecified severe protein-calorie malnutrition: Secondary | ICD-10-CM | POA: Diagnosis not present

## 2017-06-17 DIAGNOSIS — J439 Emphysema, unspecified: Secondary | ICD-10-CM | POA: Diagnosis not present

## 2017-06-17 DIAGNOSIS — M1990 Unspecified osteoarthritis, unspecified site: Secondary | ICD-10-CM | POA: Diagnosis not present

## 2017-06-17 DIAGNOSIS — I110 Hypertensive heart disease with heart failure: Secondary | ICD-10-CM | POA: Diagnosis not present

## 2017-06-17 DIAGNOSIS — I739 Peripheral vascular disease, unspecified: Secondary | ICD-10-CM | POA: Diagnosis not present

## 2017-06-17 DIAGNOSIS — M5030 Other cervical disc degeneration, unspecified cervical region: Secondary | ICD-10-CM | POA: Diagnosis not present

## 2017-06-17 DIAGNOSIS — I951 Orthostatic hypotension: Secondary | ICD-10-CM | POA: Diagnosis not present

## 2017-06-17 DIAGNOSIS — I481 Persistent atrial fibrillation: Secondary | ICD-10-CM | POA: Diagnosis not present

## 2017-06-17 DIAGNOSIS — C9190 Lymphoid leukemia, unspecified not having achieved remission: Secondary | ICD-10-CM | POA: Diagnosis not present

## 2017-06-17 DIAGNOSIS — C880 Waldenstrom macroglobulinemia: Secondary | ICD-10-CM | POA: Diagnosis not present

## 2017-06-17 LAB — UPEP/TP, 24-HR URINE
ALBUMIN, U: 62.3 %
ALPHA 1 UR: 6.4 %
Alpha 2, Urine: 12.5 %
BETA UR: 11.9 %
GAMMA UR: 6.9 %
TOTAL PROTEIN, URINE-UPE24: 480.1 mg/dL
TOTAL PROTEIN, URINE-UR/DAY: 3361 mg/(24.h) — AB (ref 30–150)
Total Volume: 700

## 2017-06-18 ENCOUNTER — Encounter: Payer: Medicare Other | Admitting: Cardiothoracic Surgery

## 2017-06-18 DIAGNOSIS — I951 Orthostatic hypotension: Secondary | ICD-10-CM | POA: Diagnosis not present

## 2017-06-18 DIAGNOSIS — C880 Waldenstrom macroglobulinemia: Secondary | ICD-10-CM | POA: Diagnosis not present

## 2017-06-18 DIAGNOSIS — C9190 Lymphoid leukemia, unspecified not having achieved remission: Secondary | ICD-10-CM | POA: Diagnosis not present

## 2017-06-18 DIAGNOSIS — I5042 Chronic combined systolic (congestive) and diastolic (congestive) heart failure: Secondary | ICD-10-CM | POA: Diagnosis not present

## 2017-06-18 DIAGNOSIS — M1990 Unspecified osteoarthritis, unspecified site: Secondary | ICD-10-CM | POA: Diagnosis not present

## 2017-06-18 DIAGNOSIS — J439 Emphysema, unspecified: Secondary | ICD-10-CM | POA: Diagnosis not present

## 2017-06-18 DIAGNOSIS — E43 Unspecified severe protein-calorie malnutrition: Secondary | ICD-10-CM | POA: Diagnosis not present

## 2017-06-18 DIAGNOSIS — I110 Hypertensive heart disease with heart failure: Secondary | ICD-10-CM | POA: Diagnosis not present

## 2017-06-18 DIAGNOSIS — I481 Persistent atrial fibrillation: Secondary | ICD-10-CM | POA: Diagnosis not present

## 2017-06-18 DIAGNOSIS — M5030 Other cervical disc degeneration, unspecified cervical region: Secondary | ICD-10-CM | POA: Diagnosis not present

## 2017-06-18 DIAGNOSIS — I739 Peripheral vascular disease, unspecified: Secondary | ICD-10-CM | POA: Diagnosis not present

## 2017-06-18 NOTE — Progress Notes (Signed)
HEMATOLOGY/ONCOLOGY CLINIC NOTE   Date of Service: 06/19/17  Patient Care Team: Biagio Borg, MD as PCP - General (Internal Medicine) [name], MD as PCP - Cardiology (Cardiology) Donita Brooks, MD as Attending Physician (Internal Medicine) Mack Guise. Lacinda Axon, MD as Consulting Physician (Urology)   CHIEF COMPLAINTS/PURPOSE OF CONSULTATION:  AL Amyloidosis  Malignant lymphoplasmacytic lymphoma   HISTORY OF PRESENTING ILLNESS:  [name] is a wonderful 73 y.o. 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  Patient follows with Dr Alen Blew for his h/o prostate cancer and was found to have borderline lymphadenopathy dating back to January 2018. He had a mediastinal adenopathy that was borderline detected on a CT scan. A repeat CT scan on 05/17/2016 showed persistent enlarged mediastinal lymph nodes and a left internal mammary lymph node. Differential considerations include lymphoproliferative disorder, granulomatous inflammation/infection or reactive adenopathy. left cervical LN Bx in 10/2016 was unrevealing.  Patient has multiple co-morbidities including PAF on Xarelto status post DCCV 10/12/92, diastolic CHF with LVEF 76-54% on echo 05/14/16, COPD with pulmonary hypertension on home O2 at night time s/p right VATS 05/20/16 for empyema. He was admitted with  Worsening fatigue and generalized swelling.  He was noted to have hypoalbuminemia and further w/u demonstrated nephrotic range proteinuria of about 6g per day. Patient's nephrology w/u showed monoclonal paraproteinemia with M spike of 1.1g/dl. He subsequently had a kidney biopsy on 02/27/2017 - which per report --prelim showed AL Amyoidosis. Final pathology pending.  Patient is being diuresed aggresivel.  ECHO 10/13 showed EF of 65-03% Diastolic function could not be evaluated due to Atrial fibrillation.  I discussed the available  results in details with the patient and his wife at bedside.  He has no focal bone pain, Has some anemia. No overt hypercalcemia at this time.   INTERVAL HISTORY:  [name]  is here for fu of his LPL and Renal AL Amyloidosis lambda light chain prior to his 4th cycle of BR. The patient's last visit with [place] was on 05/22/17. He is accompanied today by his wife. The pt reports that he is doing well overall. The pt reports having a stronger appetite. After 3 cycles his blood counts and renal numbers show partial response. He reports persistent leg swelling and takes 15m lasix each day. He has not been sleeping well; mostly needing to urinate multiple times in the night. He reports feeling very tired and fatigued. He takes Hydrocodone for his persistent back pain, though his back pain has gotten a little better. He notes that he wants to continue to get around better than he has. His wife notes his attitude and energy has gotten better.  He wants the pain around his mid-section, back pain and lymphedema to get better.  He reports being cold at night which we discussed is likely due to his low BP and significant lymphedema. He wants to be able to walk on his own and participate in life outside of his wheelchair. Notes wanting to be useful again. He reports that the doctor he sees for his pain has advised completing the chemotherapy before being able to better address his pain.   Of note since the patient last visit, pt has had Abdomen and Pelvis CT w/o contrast completed on 06/12/17 with results revealing 1. Moderate degradation, secondary lack of IV contrast and the extent of soft tissue edema. This especially makes evaluation  of adenopathy difficult. 2. Given this limitation, similar abdominal adenopathy. 3. Fluid overload, including pleural effusions, ascites, and developing right lung base pulmonary edema. 4. Aortic atherosclerosis (ICD10-I70.0), coronary artery atherosclerosis and emphysema  (ICD10-J43.9). 5. Probable left nephrolithiasis.  His most recent 24hr protein-urine test (06/16/17) showed Total Protein, Urine/day has dropped by 50% to 3361 from 3 months ago.   Lab results today (06/19/17) of CBC, CMP is as follows: all values are WNL except for RBC at 3.19, Hgb at 10.5, HCT at 32.0, MCV at 100.3, RDW at 19.5, Lymphs Abs at 0.4, BUN at 30, Calcium at 8.2, Total Protein at 4.6, Albumin at 1.8, Alkaline Phosphatase at 279.   His 06/12/17 MMP labs showed IgG at 338, IgA at 49, IgM at 933, Total Protein at 4.8, Albumin SerPI at 2.1, M protein SerPI at 0.6, all others WNL. His 06/12/17 Kappa/lambda light chains showed Kappa free at 117.9, Lambda at 27.1, Kappa:lambda ratio at 4.35.   On review of systems, pt reports strong appetite, edema, back pain, skin discomfort at his abdomen, and denies groin swelling and discomfort, and any other symptoms.   MEDICAL HISTORY:      Past Medical History:  Diagnosis Date  . Anxiety   . Arthritis    "neck" (08/15/2015)  . Chronic bronchitis ([place])   . Chronic diastolic CHF (congestive heart failure) ([place])   . Constipation   . COPD (chronic obstructive pulmonary disease) ([place])   . DDD (degenerative disc disease), cervical   . Depression   . Dilated aortic root ([place])    49m on echo 05/2016  . Edema of left lower extremity   . Facial basal cell cancer   . H/O acne vulgaris 1960s   "[facility]"  . Headache    history of - left temporal- years ago- not current (08/15/2015)  . Persistent atrial fibrillation (HCC)    on coumadin with CHADS2VASC score of 2  . Pneumonia 1960s; 2015 X 2  . Prostate cancer ([place] early 2000[place]   "low spreading; non aggressive type" (08/15/2015)  . Pulmonary HTN ([place]    PASP 450mg by echo 05/2016  . Skin cancer    "back"   SURGICAL HISTORY:       Past Surgical History:  Procedure Laterality Date  . CARDIOVERSION N/A 10/11/2016   Procedure: CARDIOVERSION; Surgeon:  CrSanda KleinMD; [facility]; Service: Cardiovascular; Laterality: N/A;  . CARDIOVERSION N/A 02/26/2017   Procedure: CARDIOVERSION; Surgeon: McLarey DresserMD; [facility]; Service: Cardiovascular; Laterality: N/A;  . COLONOSCOPY    . EXCISIONAL HEMORRHOIDECTOMY  1960s  . INGUINAL HERNIA REPAIR Right 2002  . INGUINAL HERNIA REPAIR Bilateral 08/15/2015  . INGUINAL HERNIA REPAIR Bilateral 08/15/2015   Procedure: OPEN REPAIR RECURRENT RIGHT INGUINAL HERNIA WITH MESH AND REPAIR LEFT INGUINAL HERNIA WITH MESH; Surgeon: HaFanny SkatesMD; [facility]; Laterality: Bilateral;  . INSERTION OF MESH Bilateral 08/15/2015   Procedure: INSERTION OF MESH; Surgeon: HaFanny SkatesMD; [facility]; Laterality: Bilateral;  . IR THORACENTESIS ASP PLEURAL SPACE W/IMG GUIDE  11/18/2016  . LYMPH NODE BIOPSY Bilateral 10/22/2016   Procedure: LEFT NECK LYMPH NODE BIOPSY; Surgeon: VaIvin PootMD; [facility]; Laterality: Bilateral;  . PROSTATE BIOPSY    . TONSILLECTOMY  1950s  . VIDEO ASSISTED THORACOSCOPY (VATS)/EMPYEMA Right 05/20/2016   Procedure: VIDEO ASSISTED THORACOSCOPY (VATS) with drainage of pleural effusion; Surgeon: PeTharon Aquasrigt,  MD; Location: [place]; Service: Thoracic; Laterality: Right;  [name] VIDEO BRONCHOSCOPY WITH ENDOBRONCHIAL ULTRASOUND Right 05/20/2016   Procedure: VIDEO BRONCHOSCOPY WITH ENDOBRONCHIAL ULTRASOUND; Surgeon: [name], MD; Location: [place]; Service: Thoracic; Laterality: Right;   SOCIAL HISTORY:  Social History        Social History  . Marital status: Married    Spouse name: N/A  . Number of children: N/A  . Years of education: N/A      Occupational History  . Not on file.         Social History Main Topics  . Smoking status: Former Smoker    Packs/day: 1.00    Years: 62.00    Types: Cigarettes    Quit date: 05/14/2016  . Smokeless tobacco: Never Used  . Alcohol use No  .  Drug use: No     Comment: CBD OIL  . Sexual activity: Not Currently    Partners: Female       Other Topics Concern  . Not on file      Social History Narrative  . No narrative on file   FAMILY HISTORY:       Family History  Problem Relation Age of Onset  . Colon cancer Mother   . Ovarian cancer Mother   . Alcoholism Father   . CAD Father   . Alcohol abuse Father   . Ovarian cancer Sister   . Diabetes Neg Hx   . Stomach cancer Neg Hx    ALLERGIES: is allergic to demerol [meperidine] and oxycodone hcl.  MEDICATIONS:   . Current Outpatient Medications:  .  acyclovir (ZOVIRAX) 400 MG tablet, Take 1 tablet (400 mg total) daily by mouth., Disp: 30 tablet, Rfl: 3 .  dexamethasone (DECADRON) 4 MG tablet, Take 2 tablets (8 mg total) daily by mouth. Start the day after bendamustine chemotherapy for 2 days. Take with food. (Patient not taking: Reported on 04/30/2017), Disp: 30 tablet, Rfl: 1 .  diazepam (VALIUM) 5 MG tablet, 1 by mouth prior to MRI (Patient not taking: Reported on 04/25/2017), Disp: 1 tablet, Rfl: 0 .  docusate sodium (COLACE) 100 MG capsule, Take 1-3 capsules (100-300 mg total) by mouth daily., Disp: 90 capsule, Rfl: 5 .  feeding supplement (BOOST / RESOURCE BREEZE) LIQD, Take 1 Container by mouth 3 (three) times daily between meals., Disp: 5 Container, Rfl: 0 .  furosemide (LASIX) 40 MG tablet, Take 1 tablet (40 mg total) by mouth daily., Disp: 30 tablet, Rfl: 3 .  HYDROcodone-acetaminophen (NORCO) 10-325 MG tablet, Take 1 tablet by mouth every 6 (six) hours as needed., Disp: 120 tablet, Rfl: 0 .  hydrocortisone cream 1 %, Apply 1 application topically 4 (four) times daily as needed for itching. (Patient not taking: Reported on 04/25/2017), Disp: 30 g, Rfl: 0 .  hydrOXYzine (ATARAX/VISTARIL) 10 MG tablet, Take 6 tablets (60 mg total) by mouth 3 (three) times daily as needed., Disp: 60 tablet, Rfl: 0 .  midodrine (PROAMATINE) 10 MG tablet, Take 1 tablet (10 mg total) by  mouth 3 (three) times daily with meals., Disp: 90 tablet, Rfl: 3 .  nystatin cream (MYCOSTATIN), APPLY EXTERNALLY TO THE AFFECTED AREA TWICE DAILY, Disp: 30 g, Rfl: 0 .  ondansetron (ZOFRAN) 8 MG tablet, Take 1 tablet (8 mg total) 2 (two) times daily as needed by mouth for refractory nausea / vomiting. Start on day 2 after bendamustine chemo. (Patient not taking: Reported on 04/25/2017), Disp: 30 tablet, Rfl: 1 .  polyethylene glycol (MIRALAX /  GLYCOLAX) packet, Take 17 g by mouth daily as needed. (Patient not taking: Reported on 04/25/2017), Disp: 14 each, Rfl: 0 .  prochlorperazine (COMPAZINE) 10 MG tablet, Take 1 tablet (10 mg total) every 6 (six) hours as needed by mouth (Nausea or vomiting). (Patient not taking: Reported on 04/25/2017), Disp: 30 tablet, Rfl: 1 .  rivaroxaban (XARELTO) 20 MG TABS tablet, Take 1 tablet (20 mg total) by mouth daily with supper., Disp: 30 tablet, Rfl: 6 .  senna-docusate (SENNA S) 8.6-50 MG tablet, Take 2 tablets by mouth 2 (two) times daily. Reduce to 2 tab po HS once constipation resolved, Disp: 60 tablet, Rfl: 1 .  traZODone (DESYREL) 50 MG tablet, Take 0.5-1 tablets (25-50 mg total) by mouth at bedtime as needed for sleep., Disp: 90 tablet, Rfl: 1  REVIEW OF SYSTEMS:  10 Point review of Systems was done is negative except as noted above.   PHYSICAL EXAMINATION:  ECOG PERFORMANCE STATUS: 2 - Symptomatic, <50% confined to bed  . Marland KitchenBP (!) 93/59 (BP Location: Right Arm, Patient Position: Sitting)   Pulse 79   Temp 97.6 F (36.4 C) (Axillary)   Resp 20   Ht _0  (1.803 m)   SpO2 97%   BMI 23.88 kg/m  .Body mass index is 21.23 kg/m.    GENERAL:alert, in no acute distress and comfortable  SKIN: no acute rashes, no significant lesions  EYES: conjunctiva are pink and non-injected, sclera anicteric  OROPHARYNX: MMM, no exudates, no oropharyngeal erythema or ulceration  NECK: supple, no JVD  LYMPH: no palpable lymphadenopathy in the cervical, axillary or  inguinal regions  LUNGS: clear to auscultation b/l with normal respiratory effort  HEART: regular rate & rhythm  ABDOMEN: normoactive bowel sounds , non tender, not distended.  Extremity: 3+pitting pedal edema  PSYCH: alert & oriented x 3 with fluent speech  NEURO: no focal motor/sensory deficits   LABORATORY DATA:  I have reviewed the data as listed   . CBC Latest Ref Rng & Units 06/20/2017 06/19/2017 06/12/2017  WBC 4.0 - 10.5 K/uL 5.0 4.5 5.7  Hemoglobin 13.0 - 17.0 g/dL 11.0(L) 10.5(L) -  Hematocrit 39.0 - 52.0 % 33.1(L) 32.0(L) 35.7(L)  Platelets 150 - 400 K/uL 302 262 340   . CMP Latest Ref Rng & Units 06/20/2017 06/19/2017 06/12/2017  Glucose 65 - 99 mg/dL 84 105 81  BUN 6 - 20 mg/dL 30(H) 30(H) 32(H)  Creatinine 0.61 - 1.24 mg/dL 1.05 0.86 0.97  Sodium 135 - 145 mmol/L 140 141 138  Potassium 3.5 - 5.1 mmol/L 3.9 3.7 4.4  Chloride 101 - 111 mmol/L 106 106 102  CO2 22 - 32 mmol/L _1 Calcium 8.9 - 10.3 mg/dL 8.4(L) 8.2(L) 8.7  Total Protein 6.4 - 8.3 g/dL - 4.6(L) 5.5(L)  Total Bilirubin 0.2 - 1.2 mg/dL - 0.4 0.4  Alkaline Phos 40 - 150 U/L - 279(H) 381(H)  AST 5 - 34 U/L - 18 22  ALT 0 - 55 U/L - 10 17                     *[place]*  *[facility]*  [address], [place] [address]  [phone]  -------------------------------------------------------------------  Transthoracic Echocardiography  Patient: Kenson, Groh  MR #: [id number]  Study Date: 02/22/2017  Gender: M  Age: 79  Height: 180.3 cm  Weight: 73 kg  BSA: 1.91 m^2  Pt. Status:  Room: 3E10C   ATTENDING Davonna Belling  R.  Sabino Donovan [id number]  Gillis Santa [id number]  Esau Grew [id number]  PERFORMING [facility], Inpatient  SONOGRAPHER Jannett Celestine, RDCS  cc:  -------------------------------------------------------------------  LV EF: 60% - 65%    -------------------------------------------------------------------  Indications: CHF - 428.0.  -------------------------------------------------------------------  History: PMH: Pulmonary Hypertension  Atrial fibrillation. Congestive heart failure. Chronic  obstructive pulmonary disease. Risk factors: Former tobacco use.  -------------------------------------------------------------------  Study Conclusions  - Left ventricle: The cavity size was normal. There was moderate  concentric hypertrophy. Systolic function was normal. The  estimated ejection fraction was in the range of 60% to 65%. Wall  motion was normal; there were no regional wall motion  abnormalities. The study was not technically sufficient to allow  evaluation of LV diastolic dysfunction due to atrial  fibrillation.  - Aortic valve: Trileaflet; normal thickness, mildly calcified  leaflets. There was trivial regurgitation.  - Mitral valve: Calcified annulus. There was mild regurgitation.  - Left atrium: The atrium was mildly dilated.  - Right ventricle: The cavity size was moderately dilated. Wall  thickness was normal.  - Right atrium: The atrium was massively dilated.  - Tricuspid valve: There was moderate regurgitation.  - Pulmonic valve: There was trivial regurgitation.  - Pulmonary arteries: PA peak pressure: 51 mm Hg (S).  RADIOGRAPHIC STUDIES:  I have personally reviewed the radiological images as listed and agreed with the findings in the report.   Imaging Results    Whole body skeletal survey 03/02/2017  IMPRESSION: 1. No aggressive lytic or sclerotic bone lesion of the axial and appendicular skeleton.   Electronically Signed   By: Kathreen Devoid   On: 03/02/2017 12:47    ASSESSMENT & PLAN:   72 y.o.  caucasian male with multiple medical co-morbidities with   1) Systemic AL Amyloidosis - lambda light chain based associated with newly diagnosed Lymphoplasmacytic Lymphoma ([place])  No  overt evidence of cardiac involvement based on ECHO   IgM lambda monoclonal paraproteinemia. 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  -no prohibitve toxicity from C3 of BR treatment.  Partial response after 3 cycles of BR based on improvement in M spike from 1.2 to 0.6  Plan -appropriate to proceed with C4 BR at this time (after discussion of goals of care).  -could not get PIV due to significant edema  -consent for port-a-cath placement and rescheduling C4 post port -Advised that we place a port for his treatment due to difficulty in accessing his veins, which he consented to.    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 24h UPEP with 3.361 Proteinuria daily. (this represents an improvement from >6g per 24h of proteinuria)   3) Chronic Low BP -- on midodrine. ? Autonomic dysfunction . Low albumin levels. PLAN  -will need close f/u with PCP/nephrology/cardiology for continue mx of diuretics in the setting of soft BP and needing the use of midodrine.   3.) Back pain - Defer pain management and further evaluation to PCP. -MR thoracic spine was ordered by Dr. Cathlean Cower on 03/26/17 for this -F/u with pcp for pain management of chronic back pain - could consider orthopedic evaluation if needed.   -Offered communicating with pt's doctor overseeing his pain regarding any questions of pain med interfering with his chemo cycles.  4.) SEVERE protein calorie malnutrition  -has been referred to nutritional therapy for this.  Reschedule C4 of BR after port a cath placement then Please schedule last 2 cycles of Bendamustine/Rituxan IR for port-a-cath  placement RTC with Dr Irene Limbo in 4 weeks with labs with C5D1  All of the patients questions were answered with apparent satisfaction. The patient knows to call the clinic with any problems, questions or concerns.    I spent 20 minutes counseling the patient face to face. The total time spent in the appointment  was 25 minutes and more than 50% was on counseling and direct patient cares.   Sullivan Lone MD [place] AAHIVMS [facility]  Hematology/Oncology Physician  [facility]  (Office): [phone]  (Work cell): [phone]  (Fax): [phone]    This document serves as a record of services personally performed by Sullivan Lone, MD. It was created on his behalf by Baldwin Jamaica, a trained medical scribe. The creation of this record is based on the scribe's personal observations and the provider's statements to them.   .I have reviewed the above documentation for accuracy and completeness, and I agree with the above. Brunetta Genera MD MS

## 2017-06-19 ENCOUNTER — Encounter: Payer: Self-pay | Admitting: Hematology

## 2017-06-19 ENCOUNTER — Other Ambulatory Visit: Payer: Self-pay | Admitting: General Surgery

## 2017-06-19 ENCOUNTER — Inpatient Hospital Stay: Payer: Medicare Other

## 2017-06-19 ENCOUNTER — Other Ambulatory Visit: Payer: Medicare Other

## 2017-06-19 ENCOUNTER — Telehealth: Payer: Self-pay | Admitting: Hematology

## 2017-06-19 ENCOUNTER — Inpatient Hospital Stay: Payer: Medicare Other | Attending: Hematology

## 2017-06-19 ENCOUNTER — Other Ambulatory Visit: Payer: Self-pay | Admitting: Radiology

## 2017-06-19 ENCOUNTER — Inpatient Hospital Stay (HOSPITAL_BASED_OUTPATIENT_CLINIC_OR_DEPARTMENT_OTHER): Payer: Medicare Other | Admitting: Hematology

## 2017-06-19 VITALS — BP 93/59 | HR 79 | Temp 97.6°F | Resp 20 | Ht 71.0 in

## 2017-06-19 DIAGNOSIS — Z5111 Encounter for antineoplastic chemotherapy: Secondary | ICD-10-CM | POA: Diagnosis not present

## 2017-06-19 DIAGNOSIS — E8581 Light chain (AL) amyloidosis: Secondary | ICD-10-CM | POA: Insufficient documentation

## 2017-06-19 DIAGNOSIS — C8300 Small cell B-cell lymphoma, unspecified site: Secondary | ICD-10-CM

## 2017-06-19 DIAGNOSIS — M549 Dorsalgia, unspecified: Secondary | ICD-10-CM

## 2017-06-19 DIAGNOSIS — D472 Monoclonal gammopathy: Secondary | ICD-10-CM

## 2017-06-19 DIAGNOSIS — E43 Unspecified severe protein-calorie malnutrition: Secondary | ICD-10-CM

## 2017-06-19 DIAGNOSIS — C880 Waldenstrom macroglobulinemia: Secondary | ICD-10-CM

## 2017-06-19 DIAGNOSIS — I959 Hypotension, unspecified: Secondary | ICD-10-CM | POA: Insufficient documentation

## 2017-06-19 DIAGNOSIS — N049 Nephrotic syndrome with unspecified morphologic changes: Secondary | ICD-10-CM

## 2017-06-19 DIAGNOSIS — Z8546 Personal history of malignant neoplasm of prostate: Secondary | ICD-10-CM

## 2017-06-19 DIAGNOSIS — Z5112 Encounter for antineoplastic immunotherapy: Secondary | ICD-10-CM | POA: Insufficient documentation

## 2017-06-19 LAB — COMPREHENSIVE METABOLIC PANEL
ALT: 10 U/L (ref 0–55)
ANION GAP: 9 (ref 3–11)
AST: 18 U/L (ref 5–34)
Albumin: 1.8 g/dL — ABNORMAL LOW (ref 3.5–5.0)
Alkaline Phosphatase: 279 U/L — ABNORMAL HIGH (ref 40–150)
BUN: 30 mg/dL — ABNORMAL HIGH (ref 7–26)
CHLORIDE: 106 mmol/L (ref 98–109)
CO2: 26 mmol/L (ref 22–29)
Calcium: 8.2 mg/dL — ABNORMAL LOW (ref 8.4–10.4)
Creatinine, Ser: 0.86 mg/dL (ref 0.70–1.30)
GFR calc Af Amer: >60 mL/min (ref >60)
GFR calc non Af Amer: >60 mL/min (ref >60)
Glucose, Bld: 105 mg/dL (ref 70–140)
Potassium: 3.7 mmol/L (ref 3.5–5.1)
SODIUM: 141 mmol/L (ref 136–145)
Total Bilirubin: 0.4 mg/dL (ref 0.2–1.2)
Total Protein: 4.6 g/dL — ABNORMAL LOW (ref 6.4–8.3)

## 2017-06-19 LAB — CBC WITH DIFFERENTIAL/PLATELET
Basophils Absolute: 0.1 10*3/uL (ref 0.0–0.1)
Basophils Relative: 2 %
Eosinophils Absolute: 0.3 10*3/uL (ref 0.0–0.5)
Eosinophils Relative: 6 %
HEMATOCRIT: 32 % — AB (ref 38.4–49.9)
HEMOGLOBIN: 10.5 g/dL — AB (ref 13.0–17.1)
LYMPHS ABS: 0.4 10*3/uL — AB (ref 0.9–3.3)
Lymphocytes Relative: 10 %
MCH: 32.9 pg (ref 27.2–33.4)
MCHC: 32.8 g/dL (ref 32.0–36.0)
MCV: 100.3 fL — ABNORMAL HIGH (ref 79.3–98.0)
MONOS PCT: 4 %
Monocytes Absolute: 0.2 10*3/uL (ref 0.1–0.9)
NEUTROS ABS: 3.6 10*3/uL (ref 1.5–6.5)
Neutrophils Relative %: 78 %
Platelets: 262 10*3/uL (ref 140–400)
RBC: 3.19 MIL/uL — ABNORMAL LOW (ref 4.20–5.82)
RDW: 19.5 % — ABNORMAL HIGH (ref 11.0–14.6)
WBC: 4.5 10*3/uL (ref 4.0–10.3)

## 2017-06-19 NOTE — Telephone Encounter (Signed)
Gave avs and calendar for February and march

## 2017-06-19 NOTE — Patient Instructions (Signed)
Thank you for choosing [facility] to provide your oncology and hematology care.  To afford each patient quality time with our providers, please arrive 30 minutes before your scheduled appointment time.  If you arrive late for your appointment, you may be asked to reschedule.  We strive to give you quality time with our providers, and arriving late affects you and other patients whose appointments are after yours.   If you are a no show for multiple scheduled visits, you may be dismissed from the clinic at the providers discretion.    Again, thank you for choosing [facility], our hope is that these requests will decrease the amount of time that you wait before being seen by our physicians.  ______________________________________________________________________  Should you have questions after your visit to the [facility], please contact our office at [phone] between the hours of 8:30 and 4:30 p.m.    Voicemails left after 4:30p.m will not be returned until the following business day.    For prescription refill requests, please have your pharmacy contact us directly.  Please also try to allow 48 hours for prescription requests.    Please contact the scheduling department for questions regarding scheduling.  For scheduling of procedures such as PET scans, CT scans, MRI, Ultrasound, etc please contact central scheduling at [phone].    Resources For Cancer Patients and Caregivers:   Oncolink.org:  A wonderful resource for patients and healthcare providers for information regarding your disease, ways to tract your treatment, what to expect, etc.     American Cancer Society:  800-227-2345  Can help patients locate various types of support and financial assistance  Cancer Care: 1-800-813-HOPE (4673) Provides financial assistance, online support groups, medication/co-pay assistance.    [place] DSS:  336-641-3447 Where to apply for food  stamps, Medicaid, and utility assistance  Medicare Rights Center: 800-333-4114 Helps people with Medicare understand their rights and benefits, navigate the Medicare system, and secure the quality healthcare they deserve  SCAT: 336-333-6589 [place] [employer]'s shared-ride transportation service for eligible riders who have a disability that prevents them from riding the fixed route bus.    For additional information on assistance programs please contact our social worker:   Grier Hock/Abigail Elmore:  [phone]            

## 2017-06-19 NOTE — Progress Notes (Signed)
Patient scheduled for infusion today.  4 attempts made for IV assess, however all were unsuccessful.  Dr. Irene Limbo notified, verbal orders given to schedule port placement.  Infusion appointment to follow after port placement.  [facility] notified and port appointment scheduled for 06/20/2017.  Patient and patient's wife in agreement.  Infusion scheduled for 06/24/2017.

## 2017-06-19 NOTE — Patient Instructions (Addendum)
Per Anderson Malta @ [facility] Interventional Radiology Department.Marland KitchenMarland KitchenGo to [facility] tomorrow morning (06/20/2017) @ 8:30 am.  Nothing to eat or drink after 2:30 am.  You may take your morning medications with enough water to swallow medications.  You will need a driver to take you home.  Do not take Xarelto tonight or tomorrow morning.

## 2017-06-20 ENCOUNTER — Other Ambulatory Visit: Payer: Self-pay | Admitting: Hematology

## 2017-06-20 ENCOUNTER — Ambulatory Visit (HOSPITAL_COMMUNITY)
Admission: RE | Admit: 2017-06-20 | Discharge: 2017-06-20 | Disposition: A | Discharge status: Home / Self Care | Payer: Medicare Other | Source: Ambulatory Visit | Attending: Hematology | Admitting: Hematology

## 2017-06-20 ENCOUNTER — Encounter (HOSPITAL_COMMUNITY): Payer: Self-pay

## 2017-06-20 ENCOUNTER — Ambulatory Visit: Payer: Medicare Other

## 2017-06-20 DIAGNOSIS — Z5111 Encounter for antineoplastic chemotherapy: Secondary | ICD-10-CM | POA: Diagnosis not present

## 2017-06-20 DIAGNOSIS — Z87891 Personal history of nicotine dependence: Secondary | ICD-10-CM | POA: Insufficient documentation

## 2017-06-20 DIAGNOSIS — J449 Chronic obstructive pulmonary disease, unspecified: Secondary | ICD-10-CM | POA: Insufficient documentation

## 2017-06-20 DIAGNOSIS — Z7901 Long term (current) use of anticoagulants: Secondary | ICD-10-CM | POA: Insufficient documentation

## 2017-06-20 DIAGNOSIS — C8300 Small cell B-cell lymphoma, unspecified site: Secondary | ICD-10-CM

## 2017-06-20 DIAGNOSIS — F329 Major depressive disorder, single episode, unspecified: Secondary | ICD-10-CM | POA: Diagnosis not present

## 2017-06-20 DIAGNOSIS — I5032 Chronic diastolic (congestive) heart failure: Secondary | ICD-10-CM | POA: Insufficient documentation

## 2017-06-20 DIAGNOSIS — Z452 Encounter for adjustment and management of vascular access device: Secondary | ICD-10-CM | POA: Diagnosis not present

## 2017-06-20 DIAGNOSIS — I481 Persistent atrial fibrillation: Secondary | ICD-10-CM | POA: Insufficient documentation

## 2017-06-20 DIAGNOSIS — E8581 Light chain (AL) amyloidosis: Secondary | ICD-10-CM

## 2017-06-20 DIAGNOSIS — I272 Pulmonary hypertension, unspecified: Secondary | ICD-10-CM | POA: Diagnosis not present

## 2017-06-20 DIAGNOSIS — F419 Anxiety disorder, unspecified: Secondary | ICD-10-CM | POA: Diagnosis not present

## 2017-06-20 DIAGNOSIS — Z8546 Personal history of malignant neoplasm of prostate: Secondary | ICD-10-CM | POA: Insufficient documentation

## 2017-06-20 DIAGNOSIS — C880 Waldenstrom macroglobulinemia: Secondary | ICD-10-CM | POA: Diagnosis not present

## 2017-06-20 HISTORY — PX: IR FLUORO GUIDE PORT INSERTION RIGHT: IMG5741

## 2017-06-20 HISTORY — PX: IR US GUIDE VASC ACCESS RIGHT: IMG2390

## 2017-06-20 LAB — BASIC METABOLIC PANEL
Anion gap: 11 (ref 5–15)
BUN: 30 mg/dL — ABNORMAL HIGH (ref 6–20)
CALCIUM: 8.4 mg/dL — AB (ref 8.9–10.3)
CHLORIDE: 106 mmol/L (ref 101–111)
CO2: 23 mmol/L (ref 22–32)
CREATININE: 1.05 mg/dL (ref 0.61–1.24)
Glucose, Bld: 84 mg/dL (ref 65–99)
Potassium: 3.9 mmol/L (ref 3.5–5.1)
SODIUM: 140 mmol/L (ref 135–145)

## 2017-06-20 LAB — CBC
HCT: 33.1 % — ABNORMAL LOW (ref 39.0–52.0)
Hemoglobin: 11 g/dL — ABNORMAL LOW (ref 13.0–17.0)
MCH: 33 pg (ref 26.0–34.0)
MCHC: 33.2 g/dL (ref 30.0–36.0)
MCV: 99.4 fL (ref 78.0–100.0)
Platelets: 302 10*3/uL (ref 150–400)
RBC: 3.33 MIL/uL — AB (ref 4.22–5.81)
RDW: 19.6 % — AB (ref 11.5–15.5)
WBC: 5 10*3/uL (ref 4.0–10.5)

## 2017-06-20 LAB — PROTIME-INR
INR: 1.13
PROTHROMBIN TIME: 14.4 s (ref 11.4–15.2)

## 2017-06-20 MED ORDER — CEFAZOLIN SODIUM-DEXTROSE 2-4 GM/100ML-% IV SOLN
INTRAVENOUS | Status: AC
  Administered 2017-06-20: 2 g via INTRAVENOUS
  Filled 2017-06-20: qty 100

## 2017-06-20 MED ORDER — HEPARIN SOD (PORK) LOCK FLUSH 100 UNIT/ML IV SOLN
INTRAVENOUS | Status: AC | PRN
  Administered 2017-06-20: 500 [IU] via INTRAVENOUS

## 2017-06-20 MED ORDER — SODIUM CHLORIDE 0.9 % IV SOLN: INTRAVENOUS | Status: DC

## 2017-06-20 MED ORDER — LIDOCAINE-EPINEPHRINE (PF) 1 %-1:200000 IJ SOLN
INTRAMUSCULAR | Status: AC
  Filled 2017-06-20: qty 30

## 2017-06-20 MED ORDER — LIDOCAINE-EPINEPHRINE (PF) 2 %-1:200000 IJ SOLN
INTRAMUSCULAR | Status: AC | PRN
  Administered 2017-06-20: 15 mL

## 2017-06-20 MED ORDER — HEPARIN SOD (PORK) LOCK FLUSH 100 UNIT/ML IV SOLN
INTRAVENOUS | Status: AC
  Filled 2017-06-20: qty 5

## 2017-06-20 MED ORDER — MIDAZOLAM HCL 2 MG/2ML IJ SOLN
INTRAMUSCULAR | Status: AC | PRN
  Administered 2017-06-20 (×2): 1 mg via INTRAVENOUS

## 2017-06-20 MED ORDER — FENTANYL CITRATE (PF) 100 MCG/2ML IJ SOLN
INTRAMUSCULAR | Status: AC
  Filled 2017-06-20: qty 4

## 2017-06-20 MED ORDER — CEFAZOLIN SODIUM-DEXTROSE 2-4 GM/100ML-% IV SOLN
2.0000 g | INTRAVENOUS | Status: AC
  Administered 2017-06-20: 2 g via INTRAVENOUS

## 2017-06-20 MED ORDER — MIDAZOLAM HCL 2 MG/2ML IJ SOLN
INTRAMUSCULAR | Status: AC
  Filled 2017-06-20: qty 4

## 2017-06-20 MED ORDER — FENTANYL CITRATE (PF) 100 MCG/2ML IJ SOLN
INTRAMUSCULAR | Status: AC | PRN
  Administered 2017-06-20: 25 ug via INTRAVENOUS
  Administered 2017-06-20: 50 ug via INTRAVENOUS
  Administered 2017-06-20: 25 ug via INTRAVENOUS

## 2017-06-20 NOTE — Progress Notes (Signed)
Chief Complaint: Patient was seen in consultation today for Port A Cath at the request of Brunetta Genera  Referring Physician(s): Brunetta Genera  Supervising Physician: Corrie Mckusick  Patient Status: [facility] - Out-pt  History of Present Illness: [name] is a 71 y.o. 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 Anxiety   . Arthritis    "neck" (08/15/2015)  . Chronic bronchitis ([place])   . Chronic diastolic CHF (congestive heart failure) ([place])   . Constipation   . COPD (chronic obstructive pulmonary disease) ([place])   . DDD (degenerative disc disease), cervical   . Depression   . Dilated aortic root ([place])    48m on echo 05/2016  . Edema of left lower extremity   . Facial basal cell cancer   . H/O acne vulgaris 1960s   [facility]  . Headache    history of - left temporal- years ago- not current (08/15/2015)  . Persistent atrial fibrillation (HCC)    on coumadin with CHADS2VASC score of 2  . Pneumonia 1960s; 2015 X 2  . Prostate cancer ([place] dx'd early 2000s   "low spreading; non aggressive type" (08/15/2015)  . Pulmonary HTN ([place]    PASP 434mg by echo 05/2016  . Skin cancer    "back"    Past Surgical History:  Procedure Laterality Date  . CARDIOVERSION N/A 10/11/2016   Procedure: CARDIOVERSION;  Surgeon: CrSanda KleinMD;  [facility];  Service: Cardiovascular;  Laterality: N/A;  . CARDIOVERSION N/A 02/26/2017   Procedure: CARDIOVERSION;  Surgeon: McLarey DresserMD;  [facility];  Service: Cardiovascular;  Laterality: N/A;  . COLONOSCOPY    . EXCISIONAL HEMORRHOIDECTOMY  1960s  . INGUINAL HERNIA REPAIR Right 2002  . INGUINAL HERNIA REPAIR Bilateral 08/15/2015  . INGUINAL  HERNIA REPAIR Bilateral 08/15/2015   Procedure: OPEN REPAIR RECURRENT RIGHT INGUINAL HERNIA WITH MESH AND REPAIR LEFT INGUINAL HERNIA WITH MESH;  Surgeon: HaFanny SkatesMD;  Location: [facility]: General;  Laterality: Bilateral;  . INSERTION OF MESH Bilateral 08/15/2015   Procedure: INSERTION OF MESH;  Surgeon: HaFanny SkatesMD;  [facility] Service: General;  Laterality: Bilateral;  . IR THORACENTESIS ASP PLEURAL SPACE W/IMG GUIDE  11/18/2016  . LYMPH NODE BIOPSY Bilateral 10/22/2016   Procedure: LEFT NECK LYMPH NODE BIOPSY;  Surgeon: VaIvin PootMD;  Location: [facility] Service: Thoracic;  Laterality: Bilateral;  . PROSTATE BIOPSY    . TONSILLECTOMY  1950s  . VIDEO ASSISTED THORACOSCOPY (VATS)/EMPYEMA Right 05/20/2016   Procedure: VIDEO ASSISTED THORACOSCOPY (VATS) with drainage of pleural effusion;  Surgeon: PeIvin PootMD;  Location: [facility] Service: Thoracic;  Laterality: Right;  . [place] KitchenIDEO BRONCHOSCOPY WITH ENDOBRONCHIAL ULTRASOUND Right 05/20/2016   Procedure: VIDEO BRONCHOSCOPY WITH ENDOBRONCHIAL ULTRASOUND;  Surgeon: PeIvin PootMD;  [facility] Service: Thoracic;  Laterality: Right;    Allergies: Demerol [meperidine] and Oxycodone hcl  Medications: Prior to Admission medications   Medication Sig Start Date End Date Taking? Authorizing Provider  acyclovir (ZOVIRAX) 400 MG tablet Take 1 tablet (400 mg total) daily by mouth. 03/24/17   KaBrunetta GeneraMD  dexamethasone (DECADRON) 4 MG tablet Take 2 tablets (8  mg total) daily by mouth. Start the day after bendamustine chemotherapy for 2 days. Take with food. 03/24/17   Brunetta Genera, MD  diazepam (VALIUM) 5 MG tablet 1 by mouth prior to MRI 04/10/17   Biagio Borg, MD  docusate sodium (COLACE) 100 MG capsule Take 1-3 capsules (100-300 mg total) by mouth daily. 03/04/17   Binnie Rail, MD  feeding supplement (BOOST / RESOURCE BREEZE) LIQD Take 1 Container by mouth 3 (three) times daily between meals.  03/02/17   Kayleen Memos, DO  furosemide (LASIX) 40 MG tablet Take 1 tablet (40 mg total) by mouth daily. 05/23/17   [name], MD  HYDROcodone-acetaminophen [facility]) 10-325 MG tablet Take 1 tablet by mouth every 6 (six) hours as needed. 05/14/17   Biagio Borg, MD  hydrocortisone cream 1 % Apply 1 application topically 4 (four) times daily as needed for itching. 03/02/17   Kayleen Memos, DO  hydrOXYzine (ATARAX/VISTARIL) 10 MG tablet Take 6 tablets (60 mg total) by mouth 3 (three) times daily as needed. 06/16/17   Biagio Borg, MD  midodrine (PROAMATINE) 10 MG tablet Take 1 tablet (10 mg total) by mouth 3 (three) times daily with meals. 05/12/17   Charlynne Cousins, MD  nystatin cream (MYCOSTATIN) APPLY EXTERNALLY TO THE AFFECTED AREA TWICE DAILY 05/19/17   Biagio Borg, MD  ondansetron (ZOFRAN) 8 MG tablet Take 1 tablet (8 mg total) 2 (two) times daily as needed by mouth for refractory nausea / vomiting. Start on day 2 after bendamustine chemo. 03/24/17   Brunetta Genera, MD  polyethylene glycol [facility] Floria Raveling) packet Take 17 g by mouth daily as needed. 12/30/16   Nche, Charlene Brooke, NP  prochlorperazine (COMPAZINE) 10 MG tablet Take 1 tablet (10 mg total) every 6 (six) hours as needed by mouth (Nausea or vomiting). 03/24/17   Brunetta Genera, MD  rivaroxaban (XARELTO) 20 MG TABS tablet Take 1 tablet (20 mg total) by mouth daily with supper. 01/14/17   [name], MD  senna-docusate (SENNA S) 8.6-50 MG tablet Take 2 tablets by mouth 2 (two) times daily. Reduce to 2 tab po HS once constipation resolved 04/02/17   Brunetta Genera, MD  traZODone (DESYREL) 50 MG tablet Take 0.5-1 tablets (25-50 mg total) by mouth at bedtime as needed for sleep. 05/19/17   Biagio Borg, MD     Family History  Problem Relation Age of Onset  . Colon cancer Mother   . Ovarian cancer Mother   . Alcoholism Father   . CAD Father   . Alcohol abuse Father   . Ovarian cancer Sister   .  Diabetes Neg Hx   . Stomach cancer Neg Hx     Social History   Socioeconomic History  . Marital status: Married    Spouse name: None  . Number of children: None  . Years of education: None  . Highest education level: None  Social Needs  . Financial resource strain: None  . Food insecurity - worry: None  . Food insecurity - inability: None  . Transportation needs - medical: None  . Transportation needs - non-medical: None  Occupational History  . None  Tobacco Use  . Smoking status: Former Smoker    Packs/day: 1.00    Years: 62.00    Pack years: 62.00    Types: Cigarettes    Last attempt to quit: 05/14/2016    Years since quitting: 1.1  . Smokeless  tobacco: Never Used  Substance and Sexual Activity  . Alcohol use: No    Alcohol/week: 0.0 oz  . Drug use: No    Comment: CBD OIL  . Sexual activity: Not Currently    Partners: Female  Other Topics Concern  . None  Social History Narrative  . None    Review of Systems: A 12 point ROS discussed and pertinent positives are indicated in the HPI above.  All other systems are negative.  Review of Systems  Constitutional: Positive for appetite change and fatigue. Negative for fever.  Respiratory: Positive for shortness of breath.   Cardiovascular: Negative for chest pain.  Gastrointestinal: Positive for abdominal pain.  Musculoskeletal: Positive for back pain and gait problem.  Skin:       Extreme sensitive skin in abd area---since Thoracic surgery Painful to touch  Neurological: Positive for weakness.  Psychiatric/Behavioral: Negative for behavioral problems and confusion.    Vital Signs: BP (!) 95/47   Pulse 75   Temp 97.8 F (36.6 C)   Resp 18   Ht _0  (1.702 m)   Wt 170 lb (77.1 kg)   SpO2 100%   BMI 26.63 kg/m   Physical Exam  Constitutional: He is oriented to person, place, and time.  Cardiovascular:  No murmur heard. Irreg Rate  Pulmonary/Chest: Effort normal and breath sounds normal. He has no  wheezes.  Abdominal: Soft. Bowel sounds are normal. There is no tenderness.  Musculoskeletal: Normal range of motion.  Right arm swelling- edema B lower leg swelling; edema  Neurological: He is alert and oriented to person, place, and time.  Skin: Skin is warm and dry.  Psychiatric: He has a normal mood and affect. His behavior is normal. Judgment and thought content normal.  Nursing note and vitals reviewed.   Imaging: Ct Abdomen Pelvis Wo Contrast  Result Date: 06/12/2017 CLINICAL DATA:  Prostate cancer for 15 years.  Ongoing chemotherapy. EXAM: CT ABDOMEN AND PELVIS WITHOUT CONTRAST TECHNIQUE: Multidetector CT imaging of the abdomen and pelvis was performed following the standard protocol without IV contrast. COMPARISON:  04/25/2017 FINDINGS: Lower chest: emphysema at the lung bases. Smooth septal thickening, most apparent at the right lung base. Persistent bibasilar atelectasis. Normal heart size with decreased small right pleural effusion. Anterior loculation. A small to moderate left pleural effusion is grossly similar to on the prior. Right coronary artery atherosclerosis. Hepatobiliary: Right hepatic lobe cyst. Otherwise grossly normal noncontrast appearance of the liver. Gallbladder sludge without acute cholecystitis or biliary duct dilatation. Pancreas: Mild pancreatic atrophy. Spleen: Old granulomatous disease in the spleen. Adrenals/Urinary Tract: Grossly normal adrenal glands. Mild bilateral renal cortical thinning. Favor right-sided renal vascular calcification. Interpolar punctate left renal collecting system stones suspected. No hydronephrosis. No bladder calculi. Stomach/Bowel: Normal stomach, without wall thickening. Colonic stool burden suggests constipation. Normal terminal ileum. Appendix not visualized. Normal small bowel. Vascular/Lymphatic: Advanced aortic and branch vessel atherosclerosis. Secondary lack of IV contrast and the extent of diffuse soft tissue edema, presence or  absence adenopathy is suboptimally evaluated. A node just inferior to the left renal vein, within the left periaortic station, measures 1.4 cm on image 31/series 2 and is similar to on the prior exam (when remeasured).Left lobe periaortic soft tissue thickening secondary to adenopathy also measures 1.4 cm on image 48/series 2 and is not significantly changed (when remeasured). Pelvic sidewall nodal stations not well evaluated. Reproductive: Mild prostatomegaly. Other: Small volume abdominal ascites is similar. Diffuse retroperitoneal and intraperitoneal edema. Musculoskeletal: Diffuse anasarca.Degenerative partial fusion  of the bilateral sacroiliac joints. Remote right rib fractures. Lumbosacral spondylosis. Convex left lumbar spine curvature. Probable bone island in the proximal left femur at 8 mm IMPRESSION: 1. Moderate degradation, secondary lack of IV contrast and the extent of soft tissue edema. This especially makes evaluation of adenopathy difficult. 2. Given this limitation, similar abdominal adenopathy. 3. Fluid overload, including pleural effusions, ascites, and developing right lung base pulmonary edema. 4. Aortic atherosclerosis (ICD10-I70.0), coronary artery atherosclerosis and emphysema (ICD10-J43.9). 5. Probable left nephrolithiasis. Electronically Signed   By: Abigail Miyamoto M.D.   On: 06/12/2017 15:04   Dg Hip Unilat W Or Wo Pelvis 2-3 Views Right  Result Date: 05/23/2017 CLINICAL DATA:  Right hip pain after fall. EXAM: DG HIP (WITH OR WITHOUT PELVIS) 2-3V RIGHT COMPARISON:  CT abdomen pelvis dated April 25, 2017. FINDINGS: No acute fracture or malalignment. Hip joint spaces are relatively preserved. The sacroiliac joints and pubic symphysis are intact. Atherosclerotic vascular calcifications. IMPRESSION: No acute osseous abnormality. If occult hip fracture is suspected or if the patient is unable to bear weight, MRI is the preferred modality for further evaluation. Electronically Signed   By:  Titus Dubin M.D.   On: 05/23/2017 15:13    Labs:  CBC: Recent Labs    05/08/17 0401 05/09/17 0353 05/12/17 0436 05/22/17 0922 06/12/17 1253 06/19/17 0915  WBC 3.0* 2.8* 2.7* 4.0 5.7 4.5  HGB 10.3* 10.0* 11.1*  --   --  10.5*  HCT 32.0* 30.5* 33.8* 35.6* 35.7* 32.0*  PLT 243 239 244 370 340 262    COAGS: Recent Labs    10/17/16 1225 11/18/16 1241 11/18/16 1830 02/26/17 1517 02/27/17 0216 02/27/17 1001 03/14/17 0720  INR 1.34 3.61 3.68  --  1.44  --  1.62  APTT 34  --   --  35 37* 34  --     BMP: Recent Labs    05/12/17 0436 05/22/17 0922 06/12/17 1253 06/19/17 0915  NA 135 138 138 141  K 4.1 4.8 4.4 3.7  CL 102 105 102 106  CO2 _0 GLUCOSE 87 87 81 105  BUN 26* 29* 32* 30*  CALCIUM 8.2* 8.2* 8.7 8.2*  CREATININE 0.94 0.96 0.97 0.86  GFRNONAA >60 >60 >60 >60  GFRAA >60 >60 >60 >60    LIVER FUNCTION TESTS: Recent Labs    05/06/17 0430 05/08/17 0401 05/22/17 0922 06/12/17 1253 06/19/17 0915  BILITOT 1.0  --  0.4 0.4 0.4  AST 20  --  _1 ALT 11*  --  _2 ALKPHOS 172*  --  229* 381* 279*  PROT 4.6*  --  4.4* 5.5* 4.6*  ALBUMIN 2.2* 2.5* 1.8* 2.1* 1.8*    TUMOR MARKERS: No results for input(s): AFPTM, CEA, CA199, CHROMGRNA in the last 8760 hours.  Assessment and Plan:  Renal AL Amyloidosis Malignant lymphoplasmacytic lymphoma Difficult IV start Scheduled for Port a Cath placement Risks and benefits discussed with the patient including, but not limited to bleeding, infection, pneumothorax, or fibrin sheath development and need for additional procedures.  All of the patient's questions were answered, patient is agreeable to proceed. Consent signed and in chart.  Thank you for this interesting consult.  I greatly enjoyed meeting TRANELL WOJTKIEWICZ and look forward to participating in their care.  A copy of this report was sent to the requesting provider on this date.  Electronically Signed: Lavonia Drafts,  PA-C 06/20/2017, 10:01 AM   I spent a  total of  30 Minutes   in face to face in clinical consultation, greater than 50% of which was counseling/coordinating care for [facility]

## 2017-06-20 NOTE — Sedation Documentation (Signed)
Patient is resting comfortably. 

## 2017-06-20 NOTE — Discharge Instructions (Signed)
Implanted Port Insertion, Care After This sheet gives you information about how to care for yourself after your procedure. Your health care provider may also give you more specific instructions. If you have problems or questions, contact your health care provider. What can I expect after the procedure? After your procedure, it is common to have:  Discomfort at the port insertion site.  Bruising on the skin over the port. This should improve over 3-4 days.  Follow these instructions at home: [facility] care  After your port is placed, you will get a manufacturer's information card. The card has information about your port. Keep this card with you at all times.  Take care of the port as told by your health care provider. Ask your health care provider if you or a family member can get training for taking care of the port at home. A home health care nurse may also take care of the port.  Make sure to remember what type of port you have. Incision care  Follow instructions from your health care provider about how to take care of your port insertion site. Make sure you: ? Wash your hands with soap and water before you change your bandage (dressing). If soap and water are not available, use hand sanitizer. ? Change your dressing as told by your health care provider. ? Leave stitches (sutures), skin glue, or adhesive strips in place. These skin closures may need to stay in place for 2 weeks or longer. If adhesive strip edges start to loosen and curl up, you may trim the loose edges. Do not remove adhesive strips completely unless your health care provider tells you to do that.  Check your port insertion site every day for signs of infection. Check for: ? More redness, swelling, or pain. ? More fluid or blood. ? Warmth. ? Pus or a bad smell. General instructions  Do not take baths, swim, or use a hot tub until your health care provider approves.  Do not lift anything that is heavier than 10 lb (4.5  kg) for a week, or as told by your health care provider.  Ask your health care provider when it is okay to: ? Return to work or school. ? Resume usual physical activities or sports.  Do not drive for 24 hours if you were given a medicine to help you relax (sedative).  Take over-the-counter and prescription medicines only as told by your health care provider.  Wear a medical alert bracelet in case of an emergency. This will tell any health care providers that you have a port.  Keep all follow-up visits as told by your health care provider. This is important. Contact a health care provider if:  You cannot flush your port with saline as directed, or you cannot draw blood from the port.  You have a fever or chills.  You have more redness, swelling, or pain around your port insertion site.  You have more fluid or blood coming from your port insertion site.  Your port insertion site feels warm to the touch.  You have pus or a bad smell coming from the port insertion site. Get help right away if:  You have chest pain or shortness of breath.  You have bleeding from your port that you cannot control. Summary  Take care of the port as told by your health care provider.  Change your dressing as told by your health care provider.  Keep all follow-up visits as told by your health care provider.  This information is not intended to replace advice given to you by your health care provider. Make sure you discuss any questions you have with your health care provider. Document Released: 02/17/2013 Document Revised: 03/20/2016 Document Reviewed: 03/20/2016 Elsevier Interactive Patient Education  2017 [employer].

## 2017-06-20 NOTE — Procedures (Signed)
Interventional Radiology Procedure Note  Procedure: Placement of a right IJ approach single lumen PowerPort.  Tip is positioned at the superior cavoatrial junction and catheter is ready for immediate use.  Complications: No immediate Recommendations:  - Ok to shower tomorrow - Do not submerge for 7 days - Routine line care   Signed,  Criselda Peaches, MD

## 2017-06-20 NOTE — Sedation Documentation (Signed)
Sats drop 87%- open airway and increase 02 t0 5L- sat return to 93

## 2017-06-23 ENCOUNTER — Telehealth: Payer: Self-pay | Admitting: Internal Medicine

## 2017-06-23 DIAGNOSIS — I481 Persistent atrial fibrillation: Secondary | ICD-10-CM | POA: Diagnosis not present

## 2017-06-23 DIAGNOSIS — I110 Hypertensive heart disease with heart failure: Secondary | ICD-10-CM | POA: Diagnosis not present

## 2017-06-23 DIAGNOSIS — M5030 Other cervical disc degeneration, unspecified cervical region: Secondary | ICD-10-CM | POA: Diagnosis not present

## 2017-06-23 DIAGNOSIS — E43 Unspecified severe protein-calorie malnutrition: Secondary | ICD-10-CM | POA: Diagnosis not present

## 2017-06-23 DIAGNOSIS — M1990 Unspecified osteoarthritis, unspecified site: Secondary | ICD-10-CM | POA: Diagnosis not present

## 2017-06-23 DIAGNOSIS — I739 Peripheral vascular disease, unspecified: Secondary | ICD-10-CM | POA: Diagnosis not present

## 2017-06-23 DIAGNOSIS — I5042 Chronic combined systolic (congestive) and diastolic (congestive) heart failure: Secondary | ICD-10-CM | POA: Diagnosis not present

## 2017-06-23 DIAGNOSIS — J439 Emphysema, unspecified: Secondary | ICD-10-CM | POA: Diagnosis not present

## 2017-06-23 DIAGNOSIS — I951 Orthostatic hypotension: Secondary | ICD-10-CM | POA: Diagnosis not present

## 2017-06-23 DIAGNOSIS — C880 Waldenstrom macroglobulinemia: Secondary | ICD-10-CM | POA: Diagnosis not present

## 2017-06-23 DIAGNOSIS — C9190 Lymphoid leukemia, unspecified not having achieved remission: Secondary | ICD-10-CM | POA: Diagnosis not present

## 2017-06-23 MED ORDER — HYDROCODONE-ACETAMINOPHEN 10-325 MG PO TABS: 1.0000 | ORAL_TABLET | Freq: Four times a day (QID) | ORAL | 0 refills | Status: DC | PRN

## 2017-06-23 NOTE — Telephone Encounter (Signed)
[place] refill Last OV: 11-14/18 with Dr. Jenny Reichmann Last Refill:05/14/17 Pharmacy: [facility] on [address]

## 2017-06-23 NOTE — Telephone Encounter (Signed)
Done erx 

## 2017-06-23 NOTE — Telephone Encounter (Signed)
Copied from [place]. Topic: Quick Communication - Rx Refill/Question >> Jun 23, 2017  9:00 AM Synthia Innocent wrote: Medication: HYDROcodone-acetaminophen ([place]) 10-325 MG tablet   Has the patient contacted their pharmacy? Yes.     (Agent: If no, request that the patient contact the pharmacy for the refill.)   Preferred Pharmacy (with phone number or street name): [facility] on [address]: Please be advised that RX refills may take up to 3 business days. We ask that you follow-up with your pharmacy.

## 2017-06-24 ENCOUNTER — Inpatient Hospital Stay: Payer: Medicare Other

## 2017-06-24 VITALS — BP 87/59 | HR 65 | Temp 97.5°F | Resp 18

## 2017-06-24 DIAGNOSIS — D472 Monoclonal gammopathy: Secondary | ICD-10-CM | POA: Diagnosis not present

## 2017-06-24 DIAGNOSIS — C8300 Small cell B-cell lymphoma, unspecified site: Secondary | ICD-10-CM

## 2017-06-24 DIAGNOSIS — Z5111 Encounter for antineoplastic chemotherapy: Secondary | ICD-10-CM | POA: Diagnosis not present

## 2017-06-24 DIAGNOSIS — Z5112 Encounter for antineoplastic immunotherapy: Secondary | ICD-10-CM | POA: Diagnosis not present

## 2017-06-24 DIAGNOSIS — C880 Waldenstrom macroglobulinemia: Secondary | ICD-10-CM | POA: Diagnosis not present

## 2017-06-24 DIAGNOSIS — I951 Orthostatic hypotension: Secondary | ICD-10-CM | POA: Diagnosis not present

## 2017-06-24 DIAGNOSIS — M5030 Other cervical disc degeneration, unspecified cervical region: Secondary | ICD-10-CM | POA: Diagnosis not present

## 2017-06-24 DIAGNOSIS — C9190 Lymphoid leukemia, unspecified not having achieved remission: Secondary | ICD-10-CM | POA: Diagnosis not present

## 2017-06-24 DIAGNOSIS — Z7189 Other specified counseling: Secondary | ICD-10-CM

## 2017-06-24 DIAGNOSIS — I959 Hypotension, unspecified: Secondary | ICD-10-CM | POA: Diagnosis not present

## 2017-06-24 DIAGNOSIS — M1990 Unspecified osteoarthritis, unspecified site: Secondary | ICD-10-CM | POA: Diagnosis not present

## 2017-06-24 DIAGNOSIS — M549 Dorsalgia, unspecified: Secondary | ICD-10-CM | POA: Diagnosis not present

## 2017-06-24 DIAGNOSIS — I110 Hypertensive heart disease with heart failure: Secondary | ICD-10-CM | POA: Diagnosis not present

## 2017-06-24 DIAGNOSIS — E43 Unspecified severe protein-calorie malnutrition: Secondary | ICD-10-CM | POA: Diagnosis not present

## 2017-06-24 DIAGNOSIS — I739 Peripheral vascular disease, unspecified: Secondary | ICD-10-CM | POA: Diagnosis not present

## 2017-06-24 DIAGNOSIS — N049 Nephrotic syndrome with unspecified morphologic changes: Secondary | ICD-10-CM | POA: Diagnosis not present

## 2017-06-24 DIAGNOSIS — I5042 Chronic combined systolic (congestive) and diastolic (congestive) heart failure: Secondary | ICD-10-CM | POA: Diagnosis not present

## 2017-06-24 DIAGNOSIS — J439 Emphysema, unspecified: Secondary | ICD-10-CM | POA: Diagnosis not present

## 2017-06-24 DIAGNOSIS — E8581 Light chain (AL) amyloidosis: Secondary | ICD-10-CM | POA: Diagnosis not present

## 2017-06-24 DIAGNOSIS — I481 Persistent atrial fibrillation: Secondary | ICD-10-CM | POA: Diagnosis not present

## 2017-06-24 MED ORDER — ACETAMINOPHEN 325 MG PO TABS
650.0000 mg | ORAL_TABLET | Freq: Once | ORAL | Status: AC
  Administered 2017-06-24: 650 mg via ORAL

## 2017-06-24 MED ORDER — DIPHENHYDRAMINE HCL 25 MG PO CAPS
50.0000 mg | ORAL_CAPSULE | Freq: Once | ORAL | Status: AC
  Administered 2017-06-24: 50 mg via ORAL

## 2017-06-24 MED ORDER — HEPARIN SOD (PORK) LOCK FLUSH 100 UNIT/ML IV SOLN
500.0000 [IU] | Freq: Once | INTRAVENOUS | Status: AC | PRN
  Administered 2017-06-24: 500 [IU]
  Filled 2017-06-24: qty 5

## 2017-06-24 MED ORDER — ALBUMIN HUMAN 25 % IV SOLN: 12.5000 g | Freq: Once | INTRAVENOUS | 0 refills | Status: AC

## 2017-06-24 MED ORDER — ALBUMIN HUMAN 25 % IV SOLN
12.5000 g | Freq: Once | INTRAVENOUS | Status: AC
  Administered 2017-06-24: 12.5 g via INTRAVENOUS
  Filled 2017-06-24: qty 50

## 2017-06-24 MED ORDER — DIPHENHYDRAMINE HCL 25 MG PO CAPS
ORAL_CAPSULE | ORAL | Status: AC
  Filled 2017-06-24: qty 2

## 2017-06-24 MED ORDER — SODIUM CHLORIDE 0.9% FLUSH
10.0000 mL | INTRAVENOUS | Status: DC | PRN
Start: 2017-06-24 — End: 2017-06-24
  Administered 2017-06-24: 10 mL
  Filled 2017-06-24: qty 10

## 2017-06-24 MED ORDER — DEXAMETHASONE SODIUM PHOSPHATE 10 MG/ML IJ SOLN
INTRAMUSCULAR | Status: AC
  Filled 2017-06-24: qty 1

## 2017-06-24 MED ORDER — PALONOSETRON HCL INJECTION 0.25 MG/5ML
INTRAVENOUS | Status: AC
  Filled 2017-06-24: qty 5

## 2017-06-24 MED ORDER — ACETAMINOPHEN 325 MG PO TABS
ORAL_TABLET | ORAL | Status: AC
  Filled 2017-06-24: qty 2

## 2017-06-24 MED ORDER — SODIUM CHLORIDE 0.9 % IV SOLN
375.0000 mg/m2 | Freq: Once | INTRAVENOUS | Status: AC
  Administered 2017-06-24: 700 mg via INTRAVENOUS
  Filled 2017-06-24: qty 50

## 2017-06-24 MED ORDER — PALONOSETRON HCL INJECTION 0.25 MG/5ML
0.2500 mg | Freq: Once | INTRAVENOUS | Status: AC
  Administered 2017-06-24: 0.25 mg via INTRAVENOUS

## 2017-06-24 MED ORDER — SODIUM CHLORIDE 0.9 % IV SOLN
Freq: Once | INTRAVENOUS | Status: AC
  Administered 2017-06-24: 11:00:00 via INTRAVENOUS

## 2017-06-24 MED ORDER — DEXAMETHASONE SODIUM PHOSPHATE 10 MG/ML IJ SOLN
10.0000 mg | Freq: Once | INTRAMUSCULAR | Status: AC
  Administered 2017-06-24: 10 mg via INTRAVENOUS

## 2017-06-24 MED ORDER — SODIUM CHLORIDE 0.9 % IV SOLN
70.0000 mg/m2 | Freq: Once | INTRAVENOUS | Status: AC
  Administered 2017-06-24: 125 mg via INTRAVENOUS
  Filled 2017-06-24: qty 5

## 2017-06-24 NOTE — Patient Instructions (Signed)
[facility] Discharge Instructions for Patients Receiving Chemotherapy  Today you received the following chemotherapy agents:  Rituxan and Bendeka.  To help prevent nausea and vomiting after your treatment, we encourage you to take your nausea medication as directed.   If you develop nausea and vomiting that is not controlled by your nausea medication, call the clinic.   BELOW ARE SYMPTOMS THAT SHOULD BE REPORTED IMMEDIATELY:  *FEVER GREATER THAN 100.5 F  *CHILLS WITH OR WITHOUT FEVER  NAUSEA AND VOMITING THAT IS NOT CONTROLLED WITH YOUR NAUSEA MEDICATION  *UNUSUAL SHORTNESS OF BREATH  *UNUSUAL BRUISING OR BLEEDING  TENDERNESS IN MOUTH AND THROAT WITH OR WITHOUT PRESENCE OF ULCERS  *URINARY PROBLEMS  *BOWEL PROBLEMS  UNUSUAL RASH Items with * indicate a potential emergency and should be followed up as soon as possible.  Feel free to call the clinic should you have any questions or concerns. The clinic phone number is [phone].  Please show the CHEMO ALERT CARD at check-in to the Emergency Department and triage nurse.   

## 2017-06-24 NOTE — Telephone Encounter (Signed)
Called pt no answer LMOM rx has been sent to [facility]...[name]

## 2017-06-24 NOTE — Progress Notes (Signed)
Per Dr. Irene Limbo, ok to treat today as his blood pressure responded positively after receiving albumin.

## 2017-06-25 ENCOUNTER — Inpatient Hospital Stay: Payer: Medicare Other

## 2017-06-25 VITALS — BP 102/70 | HR 65 | Temp 98.0°F | Resp 18

## 2017-06-25 DIAGNOSIS — E8581 Light chain (AL) amyloidosis: Secondary | ICD-10-CM | POA: Diagnosis not present

## 2017-06-25 DIAGNOSIS — Z7189 Other specified counseling: Secondary | ICD-10-CM

## 2017-06-25 DIAGNOSIS — D472 Monoclonal gammopathy: Secondary | ICD-10-CM | POA: Diagnosis not present

## 2017-06-25 DIAGNOSIS — N049 Nephrotic syndrome with unspecified morphologic changes: Secondary | ICD-10-CM | POA: Diagnosis not present

## 2017-06-25 DIAGNOSIS — Z5111 Encounter for antineoplastic chemotherapy: Secondary | ICD-10-CM | POA: Diagnosis not present

## 2017-06-25 DIAGNOSIS — C880 Waldenstrom macroglobulinemia: Secondary | ICD-10-CM | POA: Diagnosis not present

## 2017-06-25 DIAGNOSIS — I959 Hypotension, unspecified: Secondary | ICD-10-CM | POA: Diagnosis not present

## 2017-06-25 DIAGNOSIS — C8300 Small cell B-cell lymphoma, unspecified site: Secondary | ICD-10-CM

## 2017-06-25 DIAGNOSIS — M549 Dorsalgia, unspecified: Secondary | ICD-10-CM | POA: Diagnosis not present

## 2017-06-25 DIAGNOSIS — Z5112 Encounter for antineoplastic immunotherapy: Secondary | ICD-10-CM | POA: Diagnosis not present

## 2017-06-25 MED ORDER — SODIUM CHLORIDE 0.9 % IV SOLN
Freq: Once | INTRAVENOUS | Status: AC
  Administered 2017-06-25: 15:00:00 via INTRAVENOUS

## 2017-06-25 MED ORDER — PEGFILGRASTIM 6 MG/0.6ML ~~LOC~~ PSKT
PREFILLED_SYRINGE | SUBCUTANEOUS | Status: AC
  Filled 2017-06-25: qty 0.6

## 2017-06-25 MED ORDER — SODIUM CHLORIDE 0.9 % IV SOLN
70.0000 mg/m2 | Freq: Once | INTRAVENOUS | Status: AC
  Administered 2017-06-25: 125 mg via INTRAVENOUS
  Filled 2017-06-25: qty 5

## 2017-06-25 MED ORDER — PEGFILGRASTIM 6 MG/0.6ML ~~LOC~~ PSKT
6.0000 mg | PREFILLED_SYRINGE | Freq: Once | SUBCUTANEOUS | Status: AC
  Administered 2017-06-25: 6 mg via SUBCUTANEOUS

## 2017-06-25 MED ORDER — DEXAMETHASONE SODIUM PHOSPHATE 10 MG/ML IJ SOLN
10.0000 mg | Freq: Once | INTRAMUSCULAR | Status: AC
  Administered 2017-06-25: 10 mg via INTRAVENOUS

## 2017-06-25 NOTE — Patient Instructions (Signed)
[place] Discharge Instructions for Patients Receiving Chemotherapy  Today you received the following chemotherapy agents Bendeka.  To help prevent nausea and vomiting after your treatment, we encourage you to take your nausea medication as directed.   If you develop nausea and vomiting that is not controlled by your nausea medication, call the clinic.   BELOW ARE SYMPTOMS THAT SHOULD BE REPORTED IMMEDIATELY:  *FEVER GREATER THAN 100.5 F  *CHILLS WITH OR WITHOUT FEVER  NAUSEA AND VOMITING THAT IS NOT CONTROLLED WITH YOUR NAUSEA MEDICATION  *UNUSUAL SHORTNESS OF BREATH  *UNUSUAL BRUISING OR BLEEDING  TENDERNESS IN MOUTH AND THROAT WITH OR WITHOUT PRESENCE OF ULCERS  *URINARY PROBLEMS  *BOWEL PROBLEMS  UNUSUAL RASH Items with * indicate a potential emergency and should be followed up as soon as possible.  Feel free to call the clinic should you have any questions or concerns. The clinic phone number is [phone].  Please show the [place] at check-in to the Emergency Department and triage nurse.

## 2017-06-26 ENCOUNTER — Telehealth: Payer: Self-pay

## 2017-06-26 NOTE — Telephone Encounter (Signed)
Spoke with pt wife, [name], in response to question about decadron. Pt scheduled to take decadron 2 tablets (71m) today and tomorrow as they are the two days following his treatment. Pt wife verbalized understanding.

## 2017-06-27 ENCOUNTER — Encounter: Payer: Self-pay | Admitting: Internal Medicine

## 2017-06-27 ENCOUNTER — Telehealth: Payer: Self-pay

## 2017-06-27 DIAGNOSIS — J449 Chronic obstructive pulmonary disease, unspecified: Secondary | ICD-10-CM | POA: Diagnosis not present

## 2017-06-27 DIAGNOSIS — M1990 Unspecified osteoarthritis, unspecified site: Secondary | ICD-10-CM | POA: Diagnosis not present

## 2017-06-27 DIAGNOSIS — C9190 Lymphoid leukemia, unspecified not having achieved remission: Secondary | ICD-10-CM | POA: Diagnosis not present

## 2017-06-27 DIAGNOSIS — I739 Peripheral vascular disease, unspecified: Secondary | ICD-10-CM | POA: Diagnosis not present

## 2017-06-27 DIAGNOSIS — J439 Emphysema, unspecified: Secondary | ICD-10-CM | POA: Diagnosis not present

## 2017-06-27 DIAGNOSIS — I110 Hypertensive heart disease with heart failure: Secondary | ICD-10-CM | POA: Diagnosis not present

## 2017-06-27 DIAGNOSIS — C880 Waldenstrom macroglobulinemia: Secondary | ICD-10-CM | POA: Diagnosis not present

## 2017-06-27 DIAGNOSIS — I5042 Chronic combined systolic (congestive) and diastolic (congestive) heart failure: Secondary | ICD-10-CM | POA: Diagnosis not present

## 2017-06-27 DIAGNOSIS — E43 Unspecified severe protein-calorie malnutrition: Secondary | ICD-10-CM | POA: Diagnosis not present

## 2017-06-27 DIAGNOSIS — I481 Persistent atrial fibrillation: Secondary | ICD-10-CM | POA: Diagnosis not present

## 2017-06-27 DIAGNOSIS — I951 Orthostatic hypotension: Secondary | ICD-10-CM | POA: Diagnosis not present

## 2017-06-27 DIAGNOSIS — M5030 Other cervical disc degeneration, unspecified cervical region: Secondary | ICD-10-CM | POA: Diagnosis not present

## 2017-06-27 NOTE — Telephone Encounter (Signed)
Returned phone call to pt wife, [name], on 06/26/17 at 1614. Pt wife had requested f/u on 3/14 and 3/15 instead of current scheduled dates 3/7 and 3/8. Since pt missed a treatment, accurate dates to return 3/12 and 3/13. Pt wife confirmed they would have assistance getting the patient up and down the stairs at home on those dates.   Scheduling message sent with the following request:  Please adjust pt's schedule to reflect the following and cancel appointments on 3/7 and 3/8 once complete. Please call pt wife at [phone] to confirm changes.  3/12: LAB MO, PORT FLUSH W/LAB, EST PT 20, and INFUSION 4H 2M 3/13: INFUSION 2H  Pt wife aware to expect a call from the scheduling department to change.

## 2017-06-30 DIAGNOSIS — I951 Orthostatic hypotension: Secondary | ICD-10-CM | POA: Diagnosis not present

## 2017-06-30 DIAGNOSIS — I5042 Chronic combined systolic (congestive) and diastolic (congestive) heart failure: Secondary | ICD-10-CM | POA: Diagnosis not present

## 2017-06-30 DIAGNOSIS — M5030 Other cervical disc degeneration, unspecified cervical region: Secondary | ICD-10-CM | POA: Diagnosis not present

## 2017-06-30 DIAGNOSIS — I110 Hypertensive heart disease with heart failure: Secondary | ICD-10-CM | POA: Diagnosis not present

## 2017-06-30 DIAGNOSIS — C880 Waldenstrom macroglobulinemia: Secondary | ICD-10-CM | POA: Diagnosis not present

## 2017-06-30 DIAGNOSIS — I739 Peripheral vascular disease, unspecified: Secondary | ICD-10-CM | POA: Diagnosis not present

## 2017-06-30 DIAGNOSIS — M1990 Unspecified osteoarthritis, unspecified site: Secondary | ICD-10-CM | POA: Diagnosis not present

## 2017-06-30 DIAGNOSIS — I481 Persistent atrial fibrillation: Secondary | ICD-10-CM | POA: Diagnosis not present

## 2017-06-30 DIAGNOSIS — E43 Unspecified severe protein-calorie malnutrition: Secondary | ICD-10-CM | POA: Diagnosis not present

## 2017-06-30 DIAGNOSIS — J439 Emphysema, unspecified: Secondary | ICD-10-CM | POA: Diagnosis not present

## 2017-06-30 DIAGNOSIS — C9190 Lymphoid leukemia, unspecified not having achieved remission: Secondary | ICD-10-CM | POA: Diagnosis not present

## 2017-07-01 DIAGNOSIS — E43 Unspecified severe protein-calorie malnutrition: Secondary | ICD-10-CM | POA: Diagnosis not present

## 2017-07-01 DIAGNOSIS — L988 Other specified disorders of the skin and subcutaneous tissue: Secondary | ICD-10-CM | POA: Diagnosis not present

## 2017-07-01 DIAGNOSIS — J439 Emphysema, unspecified: Secondary | ICD-10-CM | POA: Diagnosis not present

## 2017-07-01 DIAGNOSIS — M5030 Other cervical disc degeneration, unspecified cervical region: Secondary | ICD-10-CM | POA: Diagnosis not present

## 2017-07-01 DIAGNOSIS — I110 Hypertensive heart disease with heart failure: Secondary | ICD-10-CM | POA: Diagnosis not present

## 2017-07-01 DIAGNOSIS — I951 Orthostatic hypotension: Secondary | ICD-10-CM | POA: Diagnosis not present

## 2017-07-01 DIAGNOSIS — I481 Persistent atrial fibrillation: Secondary | ICD-10-CM | POA: Diagnosis not present

## 2017-07-01 DIAGNOSIS — M1990 Unspecified osteoarthritis, unspecified site: Secondary | ICD-10-CM | POA: Diagnosis not present

## 2017-07-01 DIAGNOSIS — C9190 Lymphoid leukemia, unspecified not having achieved remission: Secondary | ICD-10-CM | POA: Diagnosis not present

## 2017-07-01 DIAGNOSIS — I739 Peripheral vascular disease, unspecified: Secondary | ICD-10-CM | POA: Diagnosis not present

## 2017-07-01 DIAGNOSIS — I5042 Chronic combined systolic (congestive) and diastolic (congestive) heart failure: Secondary | ICD-10-CM | POA: Diagnosis not present

## 2017-07-01 DIAGNOSIS — C880 Waldenstrom macroglobulinemia: Secondary | ICD-10-CM | POA: Diagnosis not present

## 2017-07-03 ENCOUNTER — Other Ambulatory Visit: Payer: Medicare Other

## 2017-07-07 DIAGNOSIS — I481 Persistent atrial fibrillation: Secondary | ICD-10-CM | POA: Diagnosis not present

## 2017-07-07 DIAGNOSIS — I739 Peripheral vascular disease, unspecified: Secondary | ICD-10-CM | POA: Diagnosis not present

## 2017-07-07 DIAGNOSIS — C9190 Lymphoid leukemia, unspecified not having achieved remission: Secondary | ICD-10-CM | POA: Diagnosis not present

## 2017-07-07 DIAGNOSIS — M1990 Unspecified osteoarthritis, unspecified site: Secondary | ICD-10-CM | POA: Diagnosis not present

## 2017-07-07 DIAGNOSIS — I5042 Chronic combined systolic (congestive) and diastolic (congestive) heart failure: Secondary | ICD-10-CM | POA: Diagnosis not present

## 2017-07-07 DIAGNOSIS — I110 Hypertensive heart disease with heart failure: Secondary | ICD-10-CM | POA: Diagnosis not present

## 2017-07-07 DIAGNOSIS — C880 Waldenstrom macroglobulinemia: Secondary | ICD-10-CM | POA: Diagnosis not present

## 2017-07-07 DIAGNOSIS — M5030 Other cervical disc degeneration, unspecified cervical region: Secondary | ICD-10-CM | POA: Diagnosis not present

## 2017-07-07 DIAGNOSIS — E43 Unspecified severe protein-calorie malnutrition: Secondary | ICD-10-CM | POA: Diagnosis not present

## 2017-07-07 DIAGNOSIS — J439 Emphysema, unspecified: Secondary | ICD-10-CM | POA: Diagnosis not present

## 2017-07-07 DIAGNOSIS — I951 Orthostatic hypotension: Secondary | ICD-10-CM | POA: Diagnosis not present

## 2017-07-08 DIAGNOSIS — E43 Unspecified severe protein-calorie malnutrition: Secondary | ICD-10-CM | POA: Diagnosis not present

## 2017-07-08 DIAGNOSIS — M1990 Unspecified osteoarthritis, unspecified site: Secondary | ICD-10-CM | POA: Diagnosis not present

## 2017-07-08 DIAGNOSIS — J439 Emphysema, unspecified: Secondary | ICD-10-CM | POA: Diagnosis not present

## 2017-07-08 DIAGNOSIS — C9190 Lymphoid leukemia, unspecified not having achieved remission: Secondary | ICD-10-CM | POA: Diagnosis not present

## 2017-07-08 DIAGNOSIS — I110 Hypertensive heart disease with heart failure: Secondary | ICD-10-CM | POA: Diagnosis not present

## 2017-07-08 DIAGNOSIS — I739 Peripheral vascular disease, unspecified: Secondary | ICD-10-CM | POA: Diagnosis not present

## 2017-07-08 DIAGNOSIS — I5042 Chronic combined systolic (congestive) and diastolic (congestive) heart failure: Secondary | ICD-10-CM | POA: Diagnosis not present

## 2017-07-08 DIAGNOSIS — I951 Orthostatic hypotension: Secondary | ICD-10-CM | POA: Diagnosis not present

## 2017-07-08 DIAGNOSIS — C880 Waldenstrom macroglobulinemia: Secondary | ICD-10-CM | POA: Diagnosis not present

## 2017-07-08 DIAGNOSIS — I481 Persistent atrial fibrillation: Secondary | ICD-10-CM | POA: Diagnosis not present

## 2017-07-08 DIAGNOSIS — M5030 Other cervical disc degeneration, unspecified cervical region: Secondary | ICD-10-CM | POA: Diagnosis not present

## 2017-07-10 ENCOUNTER — Telehealth: Payer: Self-pay | Admitting: Internal Medicine

## 2017-07-10 ENCOUNTER — Ambulatory Visit: Payer: Medicare Other | Admitting: Oncology

## 2017-07-10 DIAGNOSIS — R55 Syncope and collapse: Secondary | ICD-10-CM | POA: Diagnosis not present

## 2017-07-10 DIAGNOSIS — I110 Hypertensive heart disease with heart failure: Secondary | ICD-10-CM | POA: Diagnosis not present

## 2017-07-10 DIAGNOSIS — M5030 Other cervical disc degeneration, unspecified cervical region: Secondary | ICD-10-CM | POA: Diagnosis not present

## 2017-07-10 DIAGNOSIS — I5033 Acute on chronic diastolic (congestive) heart failure: Secondary | ICD-10-CM | POA: Diagnosis not present

## 2017-07-10 DIAGNOSIS — L988 Other specified disorders of the skin and subcutaneous tissue: Secondary | ICD-10-CM | POA: Diagnosis not present

## 2017-07-10 DIAGNOSIS — I951 Orthostatic hypotension: Secondary | ICD-10-CM | POA: Diagnosis not present

## 2017-07-10 DIAGNOSIS — C9190 Lymphoid leukemia, unspecified not having achieved remission: Secondary | ICD-10-CM | POA: Diagnosis not present

## 2017-07-10 DIAGNOSIS — I5042 Chronic combined systolic (congestive) and diastolic (congestive) heart failure: Secondary | ICD-10-CM | POA: Diagnosis not present

## 2017-07-10 DIAGNOSIS — M1990 Unspecified osteoarthritis, unspecified site: Secondary | ICD-10-CM | POA: Diagnosis not present

## 2017-07-10 DIAGNOSIS — I481 Persistent atrial fibrillation: Secondary | ICD-10-CM | POA: Diagnosis not present

## 2017-07-10 DIAGNOSIS — I739 Peripheral vascular disease, unspecified: Secondary | ICD-10-CM | POA: Diagnosis not present

## 2017-07-10 DIAGNOSIS — C880 Waldenstrom macroglobulinemia: Secondary | ICD-10-CM | POA: Diagnosis not present

## 2017-07-10 DIAGNOSIS — C8300 Small cell B-cell lymphoma, unspecified site: Secondary | ICD-10-CM | POA: Diagnosis not present

## 2017-07-10 DIAGNOSIS — E43 Unspecified severe protein-calorie malnutrition: Secondary | ICD-10-CM | POA: Diagnosis not present

## 2017-07-10 DIAGNOSIS — J439 Emphysema, unspecified: Secondary | ICD-10-CM | POA: Diagnosis not present

## 2017-07-10 NOTE — Telephone Encounter (Signed)
Called pt and spoke with his wife [name] who stated she had received a letter from office stating to call and get pt scheduled for a 76-monthfollow up visit.  JMarcie Balstated pt is in a wheelchair and due having home nursing care it is hard for them to get pt and wheelchair transported to have him brought in.  At pt's last visit 12/23/16, office spirometry had tried to be done but at that time machine was not working and spirometry was not able to be done.  When pt was supposed to have come back 03/2017, pt was diagnosed with cancer and later began chemo treatments.  Dr. YAnnamaria Boots please advise if pt needs to come in for another appt with you or if there are things they can do at home based on the one OV pt had with you 12/23/16 or if pt really needs to come in for another appt.  Thanks!

## 2017-07-11 DIAGNOSIS — C9190 Lymphoid leukemia, unspecified not having achieved remission: Secondary | ICD-10-CM | POA: Diagnosis not present

## 2017-07-11 DIAGNOSIS — C880 Waldenstrom macroglobulinemia: Secondary | ICD-10-CM | POA: Diagnosis not present

## 2017-07-11 DIAGNOSIS — E43 Unspecified severe protein-calorie malnutrition: Secondary | ICD-10-CM | POA: Diagnosis not present

## 2017-07-11 DIAGNOSIS — M1990 Unspecified osteoarthritis, unspecified site: Secondary | ICD-10-CM | POA: Diagnosis not present

## 2017-07-11 DIAGNOSIS — I951 Orthostatic hypotension: Secondary | ICD-10-CM | POA: Diagnosis not present

## 2017-07-11 DIAGNOSIS — I739 Peripheral vascular disease, unspecified: Secondary | ICD-10-CM | POA: Diagnosis not present

## 2017-07-11 DIAGNOSIS — I481 Persistent atrial fibrillation: Secondary | ICD-10-CM | POA: Diagnosis not present

## 2017-07-11 DIAGNOSIS — J439 Emphysema, unspecified: Secondary | ICD-10-CM | POA: Diagnosis not present

## 2017-07-11 DIAGNOSIS — M5030 Other cervical disc degeneration, unspecified cervical region: Secondary | ICD-10-CM | POA: Diagnosis not present

## 2017-07-11 DIAGNOSIS — I5042 Chronic combined systolic (congestive) and diastolic (congestive) heart failure: Secondary | ICD-10-CM | POA: Diagnosis not present

## 2017-07-11 DIAGNOSIS — I110 Hypertensive heart disease with heart failure: Secondary | ICD-10-CM | POA: Diagnosis not present

## 2017-07-11 NOTE — Telephone Encounter (Signed)
Spoke with [name], advised her of message from [facility]. Nothing further is needed.

## 2017-07-11 NOTE — Telephone Encounter (Signed)
Understand. Ok to cancel appointment. We will be happy to see him again if we can help. Hope he feels better.

## 2017-07-14 ENCOUNTER — Telehealth (HOSPITAL_COMMUNITY): Payer: Self-pay | Admitting: *Deleted

## 2017-07-14 DIAGNOSIS — C880 Waldenstrom macroglobulinemia: Secondary | ICD-10-CM | POA: Diagnosis not present

## 2017-07-14 DIAGNOSIS — I110 Hypertensive heart disease with heart failure: Secondary | ICD-10-CM | POA: Diagnosis not present

## 2017-07-14 DIAGNOSIS — I5042 Chronic combined systolic (congestive) and diastolic (congestive) heart failure: Secondary | ICD-10-CM | POA: Diagnosis not present

## 2017-07-14 DIAGNOSIS — I951 Orthostatic hypotension: Secondary | ICD-10-CM | POA: Diagnosis not present

## 2017-07-14 DIAGNOSIS — E43 Unspecified severe protein-calorie malnutrition: Secondary | ICD-10-CM | POA: Diagnosis not present

## 2017-07-14 DIAGNOSIS — J439 Emphysema, unspecified: Secondary | ICD-10-CM | POA: Diagnosis not present

## 2017-07-14 DIAGNOSIS — I481 Persistent atrial fibrillation: Secondary | ICD-10-CM | POA: Diagnosis not present

## 2017-07-14 DIAGNOSIS — M1990 Unspecified osteoarthritis, unspecified site: Secondary | ICD-10-CM | POA: Diagnosis not present

## 2017-07-14 DIAGNOSIS — I739 Peripheral vascular disease, unspecified: Secondary | ICD-10-CM | POA: Diagnosis not present

## 2017-07-14 DIAGNOSIS — M5030 Other cervical disc degeneration, unspecified cervical region: Secondary | ICD-10-CM | POA: Diagnosis not present

## 2017-07-14 DIAGNOSIS — C9190 Lymphoid leukemia, unspecified not having achieved remission: Secondary | ICD-10-CM | POA: Diagnosis not present

## 2017-07-14 NOTE — Telephone Encounter (Signed)
This would be fine.

## 2017-07-14 NOTE — Telephone Encounter (Signed)
Advanced Heart Failure Triage Encounter  Patient Name: [name]  Date of Call: 07/14/17  Problem:  Benjamine Mola RN with [facility] home health called to request orders for 4 layer compression wraps for his leg swelling.  Also request for lymphedema  foot pumps since he is home bound and unable to go to the outpatient clinic for lymphedema management. Patient does not have any open areas to legs.   Plan:  Will forward to Dr. Aundra Dubin to verify this is ok and will call her back at [phone].  Darron Doom, RN

## 2017-07-15 ENCOUNTER — Telehealth: Payer: Self-pay | Admitting: Internal Medicine

## 2017-07-15 NOTE — Telephone Encounter (Signed)
[place] for [employer]

## 2017-07-15 NOTE — Telephone Encounter (Signed)
Called Elizabeth back and left verbal orders on VM per Dr. Aundra Dubin.

## 2017-07-15 NOTE — Telephone Encounter (Signed)
Copied from [place]. Topic: Quick Communication - See Telephone Encounter >> Jul 15, 2017 11:14 AM Cl[place], Laban Emperor wrote: CRM for notification. See Telephone encounter for:  Nicolette with [facility] calling to request verbal orders: PT for 2 week 4 and 1 week 4 to improve strength, balance and endurance. Also home health aid 2 week 2 to help with ADL.  Contact [phone] 07/15/17.

## 2017-07-16 NOTE — Telephone Encounter (Signed)
Notified Nicolette w/MD response.Marland KitchenJohny Chess

## 2017-07-17 ENCOUNTER — Other Ambulatory Visit: Payer: Medicare Other

## 2017-07-17 ENCOUNTER — Ambulatory Visit: Payer: Medicare Other

## 2017-07-17 ENCOUNTER — Ambulatory Visit: Payer: Medicare Other | Admitting: Hematology

## 2017-07-17 DIAGNOSIS — I739 Peripheral vascular disease, unspecified: Secondary | ICD-10-CM | POA: Diagnosis not present

## 2017-07-17 DIAGNOSIS — I951 Orthostatic hypotension: Secondary | ICD-10-CM | POA: Diagnosis not present

## 2017-07-17 DIAGNOSIS — C880 Waldenstrom macroglobulinemia: Secondary | ICD-10-CM | POA: Diagnosis not present

## 2017-07-17 DIAGNOSIS — L988 Other specified disorders of the skin and subcutaneous tissue: Secondary | ICD-10-CM | POA: Diagnosis not present

## 2017-07-17 DIAGNOSIS — I5042 Chronic combined systolic (congestive) and diastolic (congestive) heart failure: Secondary | ICD-10-CM | POA: Diagnosis not present

## 2017-07-17 DIAGNOSIS — M1990 Unspecified osteoarthritis, unspecified site: Secondary | ICD-10-CM | POA: Diagnosis not present

## 2017-07-17 DIAGNOSIS — I110 Hypertensive heart disease with heart failure: Secondary | ICD-10-CM | POA: Diagnosis not present

## 2017-07-17 DIAGNOSIS — I481 Persistent atrial fibrillation: Secondary | ICD-10-CM | POA: Diagnosis not present

## 2017-07-17 DIAGNOSIS — C9190 Lymphoid leukemia, unspecified not having achieved remission: Secondary | ICD-10-CM | POA: Diagnosis not present

## 2017-07-17 DIAGNOSIS — E43 Unspecified severe protein-calorie malnutrition: Secondary | ICD-10-CM | POA: Diagnosis not present

## 2017-07-17 DIAGNOSIS — M5030 Other cervical disc degeneration, unspecified cervical region: Secondary | ICD-10-CM | POA: Diagnosis not present

## 2017-07-17 DIAGNOSIS — J439 Emphysema, unspecified: Secondary | ICD-10-CM | POA: Diagnosis not present

## 2017-07-17 NOTE — Progress Notes (Signed)
HEMATOLOGY/ONCOLOGY CLINIC NOTE   Date of Service: 07/22/17  Patient Care Team: Biagio Borg, MD as PCP - General (Internal Medicine) [name], MD as PCP - Cardiology (Cardiology) Donita Brooks, MD as Attending Physician (Internal Medicine) Mack Guise. 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  Patient follows with Dr Alen Blew for his h/o prostate cancer and was found to have borderline lymphadenopathy dating back to January 2018. He had a mediastinal adenopathy that was borderline detected on a CT scan. A repeat CT scan on 05/17/2016 showed persistent enlarged mediastinal lymph nodes and a left internal mammary lymph node. Differential considerations include lymphoproliferative disorder, granulomatous inflammation/infection or reactive adenopathy. left cervical LN Bx in 10/2016 was unrevealing.  Patient has multiple co-morbidities including PAF on Xarelto status post DCCV 11/14/23, diastolic CHF with LVEF 95-63% on echo 05/14/16, COPD with pulmonary hypertension on home O2 at night time s/p right VATS 05/20/16 for empyema. He was admitted with  Worsening fatigue and generalized swelling.  He was noted to have hypoalbuminemia and further w/u demonstrated nephrotic range proteinuria of about 6g per day. Patient's nephrology w/u showed monoclonal paraproteinemia with M spike of 1.1g/dl. He subsequently had a kidney biopsy on 02/27/2017 - which per report --prelim showed AL Amyoidosis. Final pathology pending.  Patient is being diuresed aggresivel.  ECHO 10/13 showed EF of 87-56% Diastolic function could not be evaluated due to Atrial  fibrillation.  I discussed the available results in details with the patient and his wife at bedside.  He has no focal bone pain, Has some anemia. No overt hypercalcemia at this time.   INTERVAL HISTORY:   [name]  is here for follow up of his LPL and Renal AL Amyloidosis lambda light chain prior to his 5th cycle of BR. He presents to the clinic in a wheelchair with oxygen canula. He is accompanied today by his wife. He notes he has stopped PT for a little while due to insurance. He has nurses that help him bathe at home. He has been having his legs wrapped to prevent leg swelling. This has helped some. He has the port now. She notes he has the label of home bound and wonders if he should go to clinic for rehab. He has notes he has growing interests including food. His appetite has much improved.    On review of symptoms, pt notes sweating in his hands that will wake him up. He also gets very cold at night given his sweating. Leg swelling improving with leg wraps. He notes his hands have been swelling more. His wife notes his appetite has improved and his diet has changed. He has about 2 daily BM with creamy white stool. When he stands up to pee, he may have stool release. He has held back on the stool softener. He notes his has been urinating, but wants his stream was faster.     MEDICAL HISTORY:      Past Medical History:  Diagnosis Date  . Anxiety   . Arthritis    "neck" (08/15/2015)  . Chronic bronchitis ([place])   . Chronic diastolic CHF (congestive heart failure) ([place])   .  Constipation   . COPD (chronic obstructive pulmonary disease) ([place])   . DDD (degenerative disc disease), cervical   . Depression   . Dilated aortic root ([place])    13m on echo 05/2016  . Edema of left lower extremity   . Facial basal cell cancer   . H/O acne vulgaris 1960s   [facility]  . Headache    history of - left temporal- years ago- not current (08/15/2015)  .  Persistent atrial fibrillation (HCC)    on coumadin with CHADS2VASC score of 2  . Pneumonia 1960s; 2015 X 2  . Prostate cancer ([place] dx'd early 2000s   "low spreading; non aggressive type" (08/15/2015)  . Pulmonary HTN ([place]    PASP 43mg by echo 05/2016  . Skin cancer    "back"   SURGICAL HISTORY:       Past Surgical History:  Procedure Laterality Date  . CARDIOVERSION N/A 10/11/2016   Procedure: CARDIOVERSION; Surgeon: CrSanda KleinMD; [facility]; Service: Cardiovascular; Laterality: N/A;  . CARDIOVERSION N/A 02/26/2017   Procedure: CARDIOVERSION; Surgeon: McLarey DresserMD; [facility]; Service: Cardiovascular; Laterality: N/A;  . COLONOSCOPY    . EXCISIONAL HEMORRHOIDECTOMY  1960s  . INGUINAL HERNIA REPAIR Right 2002  . INGUINAL HERNIA REPAIR Bilateral 08/15/2015  . INGUINAL HERNIA REPAIR Bilateral 08/15/2015   Procedure: OPEN REPAIR RECURRENT RIGHT INGUINAL HERNIA WITH MESH AND REPAIR LEFT INGUINAL HERNIA WITH MESH; Surgeon: HaFanny SkatesMD; [facility]; Laterality: Bilateral;  . INSERTION OF MESH Bilateral 08/15/2015   Procedure: INSERTION OF MESH; Surgeon: HaFanny SkatesMD; [facility]; Laterality: Bilateral;  . IR THORACENTESIS ASP PLEURAL SPACE W/IMG GUIDE  11/18/2016  . LYMPH NODE BIOPSY Bilateral 10/22/2016   Procedure: LEFT NECK LYMPH NODE BIOPSY; Surgeon: VaIvin PootMD; [facility]; Laterality: Bilateral;  . PROSTATE BIOPSY    . TONSILLECTOMY  1950s  . VIDEO ASSISTED THORACOSCOPY (VATS)/EMPYEMA Right 05/20/2016   Procedure: VIDEO ASSISTED THORACOSCOPY (VATS) with drainage of pleural effusion; Surgeon: PeIvin PootMD; [facility]; Laterality: Right;  . Marland KitchenIDEO BRONCHOSCOPY WITH ENDOBRONCHIAL ULTRASOUND Right 05/20/2016   Procedure: VIDEO BRONCHOSCOPY WITH ENDOBRONCHIAL ULTRASOUND; Surgeon: PeIvin PootMD; [facility];  Laterality: Right;   SOCIAL HISTORY:  Social History        Social History  . Marital status: Married    Spouse name: N/A  . Number of children: N/A  . Years of education: N/A      Occupational History  . Not on file.         Social History Main Topics  . Smoking status: Former Smoker    Packs/day: 1.00    Years: 62.00    Types: Cigarettes    Quit date: 05/14/2016  . Smokeless tobacco: Never Used  . Alcohol use No  . Drug use: No     Comment: CBD OIL  . Sexual activity: Not Currently    Partners: Female       Other Topics Concern  . Not on file      Social History Narrative  . No narrative on file   FAMILY HISTORY:       Family History  Problem Relation Age of Onset  . Colon cancer Mother   . Ovarian cancer Mother   . Alcoholism Father   . CAD Father   . Alcohol abuse Father   .  Ovarian cancer Sister   . Diabetes Neg Hx   . Stomach cancer Neg Hx    ALLERGIES: is allergic to demerol [meperidine] and oxycodone hcl.  MEDICATIONS:   . Current Outpatient Medications:  .  acyclovir (ZOVIRAX) 400 MG tablet, Take 1 tablet (400 mg total) daily by mouth., Disp: 30 tablet, Rfl: 3 .  dexamethasone (DECADRON) 4 MG tablet, Take 2 tablets (8 mg total) daily by mouth. Start the day after bendamustine chemotherapy for 2 days. Take with food., Disp: 30 tablet, Rfl: 1 .  docusate sodium (COLACE) 100 MG capsule, Take 1-3 capsules (100-300 mg total) by mouth daily., Disp: 90 capsule, Rfl: 5 .  feeding supplement (BOOST / RESOURCE BREEZE) LIQD, Take 1 Container by mouth 3 (three) times daily between meals., Disp: 5 Container, Rfl: 0 .  furosemide (LASIX) 40 MG tablet, Take 1 tablet (40 mg total) by mouth daily., Disp: 30 tablet, Rfl: 3 .  HYDROcodone-acetaminophen (NORCO) 10-325 MG tablet, Take 1 tablet by mouth every 6 (six) hours as needed., Disp: 120 tablet, Rfl: 0 .  hydrOXYzine (ATARAX/VISTARIL) 10 MG tablet, Take 6 tablets (60 mg total) by mouth 3 (three) times daily as  needed., Disp: 60 tablet, Rfl: 0 .  midodrine (PROAMATINE) 10 MG tablet, Take 1 tablet (10 mg total) by mouth 3 (three) times daily with meals., Disp: 90 tablet, Rfl: 3 .  nystatin cream (MYCOSTATIN), APPLY EXTERNALLY TO THE AFFECTED AREA TWICE DAILY, Disp: 30 g, Rfl: 0 .  polyethylene glycol (MIRALAX / GLYCOLAX) packet, Take 17 g by mouth daily as needed., Disp: 14 each, Rfl: 0 .  prochlorperazine (COMPAZINE) 10 MG tablet, Take 1 tablet (10 mg total) every 6 (six) hours as needed by mouth (Nausea or vomiting)., Disp: 30 tablet, Rfl: 1 .  rivaroxaban (XARELTO) 20 MG TABS tablet, Take 1 tablet (20 mg total) by mouth daily with supper., Disp: 30 tablet, Rfl: 6 .  senna-docusate (SENNA S) 8.6-50 MG tablet, Take 2 tablets by mouth 2 (two) times daily. Reduce to 2 tab po HS once constipation resolved, Disp: 60 tablet, Rfl: 1 .  traZODone (DESYREL) 50 MG tablet, Take 0.5-1 tablets (25-50 mg total) by mouth at bedtime as needed for sleep., Disp: 90 tablet, Rfl: 1 .  hydrocortisone cream 1 %, Apply 1 application topically 4 (four) times daily as needed for itching. (Patient not taking: Reported on 07/22/2017), Disp: 30 g, Rfl: 0 .  ondansetron (ZOFRAN) 8 MG tablet, Take 1 tablet (8 mg total) 2 (two) times daily as needed by mouth for refractory nausea / vomiting. Start on day 2 after bendamustine chemo. (Patient not taking: Reported on 07/22/2017), Disp: 30 tablet, Rfl: 1  Current Facility-Administered Medications:  .  potassium chloride 20 MEQ/15ML (10%) solution 40 mEq, 40 mEq, Oral, Q2H, Regnald Bowens, Cloria Spring, MD  REVIEW OF SYSTEMS:   .10 Point review of Systems was done is negative except as noted above.   PHYSICAL EXAMINATION:  ECOG PERFORMANCE STATUS: 2 - Symptomatic, <50% confined to bed  .BP (!) 92/57 (BP Location: Left Arm, Patient Position: Sitting) Comment: Environmental education officer is aware  Pulse (!) 59 Comment: Samantha RN is aware  Temp 97.6 F (36.4 C) (Oral)   Resp 16   Ht _0  (1.702 m)   SpO2  100%   BMI 26.63 kg/m  . GENERAL:alert, in no acute distress and comfortable SKIN: no acute rashes, no significant lesions EYES: conjunctiva are pink and non-injected, sclera anicteric OROPHARYNX: MMM, no exudates, no oropharyngeal erythema or ulceration  NECK: supple, no JVD LYMPH:  no palpable lymphadenopathy in the cervical, axillary or inguinal regions LUNGS: clear to auscultation b/l with normal respiratory effort HEART: regular rate & rhythm ABDOMEN:  normoactive bowel sounds , non tender, not distended. Extremity: generalized anasarca PSYCH: alert & oriented x 3 with fluent speech NEURO: no focal motor/sensory deficits    LABORATORY DATA:  I have reviewed the data as listed   . CBC Latest Ref Rng & Units 07/22/2017 06/20/2017 06/19/2017  WBC 4.0 - 10.3 K/uL 5.9 5.0 4.5  Hemoglobin 13.0 - 17.1 g/dL 11.0(L) 11.0(L) 10.5(L)  Hematocrit 38.4 - 49.9 % 33.7(L) 33.1(L) 32.0(L)  Platelets 140 - 400 K/uL 300 302 262   . CMP Latest Ref Rng & Units 07/22/2017 06/20/2017 06/19/2017  Glucose 70 - 140 mg/dL 109 84 105  BUN 7 - 26 mg/dL 21 30(H) 30(H)  Creatinine 0.70 - 1.30 mg/dL 0.81 1.05 0.86  Sodium 136 - 145 mmol/L 136 140 141  Potassium 3.5 - 5.1 mmol/L 2.9(LL) 3.9 3.7  Chloride 98 - 109 mmol/L 98 106 106  CO2 22 - 29 mmol/L 31(H) 23 26  Calcium 8.4 - 10.4 mg/dL 8.1(L) 8.4(L) 8.2(L)  Total Protein 6.4 - 8.3 g/dL 4.4(L) - 4.6(L)  Total Bilirubin 0.2 - 1.2 mg/dL 0.4 - 0.4  Alkaline Phos 40 - 150 U/L 233(H) - 279(H)  AST 5 - 34 U/L 15 - 18  ALT 0 - 55 U/L 10 - 10                     *[place]*  *[facility]*  [address], [place] [address]  [phone]  -------------------------------------------------------------------  Transthoracic Echocardiography  Patient: [name], [name]  MR #: [id number]  Study Date: 02/22/2017  Gender: M  Age: 70  Height: 180.3 cm  Weight: 73 kg  BSA: 1.91 m^2  Pt. Status:  Room: [place], [place], [place]  Gillis Santa [id number]  Esau Grew [id number]  PERFORMING [facility], Inpatient  SONOGRAPHER Jannett Celestine, RDCS  cc:  -------------------------------------------------------------------  LV EF: 60% - 65%  -------------------------------------------------------------------  Indications: CHF - 428.0.  -------------------------------------------------------------------  History: PMH: Pulmonary Hypertension  Atrial fibrillation. Congestive heart failure. Chronic  obstructive pulmonary disease. Risk factors: Former tobacco use.  -------------------------------------------------------------------  Study Conclusions  - Left ventricle: The cavity size was normal. There was moderate  concentric hypertrophy. Systolic function was normal. The  estimated ejection fraction was in the range of 60% to 65%. Wall  motion was normal; there were no regional wall motion  abnormalities. The study was not technically sufficient to allow  evaluation of LV diastolic dysfunction due to atrial  fibrillation.  - Aortic valve: Trileaflet; normal thickness, mildly calcified  leaflets. There was trivial regurgitation.  - Mitral valve: Calcified annulus. There was mild regurgitation.  - Left atrium: The atrium was mildly dilated.  - Right ventricle: The cavity size was moderately dilated. Wall  thickness was normal.  - Right atrium: The atrium was massively dilated.  - Tricuspid valve: There was moderate regurgitation.  - Pulmonic valve: There was trivial regurgitation.  - Pulmonary arteries: PA peak pressure: 51 mm Hg (S).  RADIOGRAPHIC STUDIES:  I have personally reviewed the radiological images as listed and agreed with the findings in the report.   Imaging Results    Whole body skeletal survey 03/02/2017  IMPRESSION: 1. No aggressive lytic or sclerotic bone lesion of the  axial and appendicular  skeleton.   Electronically Signed   By: Kathreen Devoid   On: 03/02/2017 12:47    ASSESSMENT & PLAN:   72 y.o.  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  -no prohibitive toxicity from C4 of BR treatment.  Good partial response after 4 cycles of BR based on improvement in M spike from 1.2 to 0.4  Plan -Labs reviewed with pt and his wife. Potassium at 2.9, likely from lasix. Will prescribe him oral K today and also given po prescription to potassium replacement.  -appropriate to proceed with C5 BR at this time. He continues to have good partial response to treatment.  -I advised patient to hold imodium unless he has diarrhea and he can reduce use of laxities.   -I advise he increase protein in his diet and increase physical activity. -Following current regimen, I discussed the role of maintenance rituxan every 2-3 months or target therapy ibrutinib w or w/o rituxan or just surveillance.    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 24h UPEP with 3,361 Proteinuria daily. (this represents an improvement from >6g per 24h of proteinuria)   3) Chronic Low BP -- on midodrine. ? Autonomic dysfunction . Low albumin levels. PLAN  -will need close f/u with PCP/nephrology/cardiology for continue mx of diuretics in the setting of soft BP and needing the use of midodrine. -He is currently on Lasix 55m daily, except on days of chemotherapy.    3.) Back pain -Defer pain management and further evaluation to PCP. -MR thoracic spine was ordered by Dr. JCathlean Coweron 03/26/17 and was negative for fracture or metastatic disease -F/u with pcp for pain management of chronic back pain -  could consider orthopedic evaluation if needed.   -Previously offered communicating with pt's doctor overseeing his pain regarding any questions of pain med interfering with his chemo cycles.  4.) SEVERE protein calorie malnutrition -has been referred to nutritional therapy for this. -06/16/17 24hr urine shows improvement with urine protein down to 3,361.  Plz schedule C6 of Bendamustine/Rituxan RTC with Dr KIrene Limboin 4 weeks with C6D1 of ctx with labs  All of the patients questions were answered with apparent satisfaction. The patient knows to call the clinic with any problems, questions or concerns.   . The total time spent in the appointment was 25 minutes and more than 50% was on counseling and direct patient cares.    GSullivan LoneMD MValley SpringsAAHIVMS SWillow Lane InfirmaryCM[facility] Hematology/Oncology Physician  [facility] (Office): [phone] (Work cell): [phone] (Fax): [phone]   This document serves as a record of services personally performed by GSullivan Lone MD. It was created on his behalf by AJoslyn Devon a trained medical scribe. The creation of this record is based on the scribe's personal observations and the provider's statements to them.    .I have reviewed the above documentation for accuracy and completeness, and I agree with the above. .Brunetta GeneraMD MS

## 2017-07-18 ENCOUNTER — Other Ambulatory Visit: Payer: Medicare Other

## 2017-07-18 ENCOUNTER — Ambulatory Visit: Payer: Medicare Other

## 2017-07-21 ENCOUNTER — Telehealth: Payer: Self-pay | Admitting: Hematology

## 2017-07-21 ENCOUNTER — Ambulatory Visit: Payer: Medicare Other | Admitting: Internal Medicine

## 2017-07-21 DIAGNOSIS — I110 Hypertensive heart disease with heart failure: Secondary | ICD-10-CM | POA: Diagnosis not present

## 2017-07-21 DIAGNOSIS — J439 Emphysema, unspecified: Secondary | ICD-10-CM | POA: Diagnosis not present

## 2017-07-21 DIAGNOSIS — I5042 Chronic combined systolic (congestive) and diastolic (congestive) heart failure: Secondary | ICD-10-CM | POA: Diagnosis not present

## 2017-07-21 DIAGNOSIS — I739 Peripheral vascular disease, unspecified: Secondary | ICD-10-CM | POA: Diagnosis not present

## 2017-07-21 DIAGNOSIS — I481 Persistent atrial fibrillation: Secondary | ICD-10-CM | POA: Diagnosis not present

## 2017-07-21 DIAGNOSIS — C9190 Lymphoid leukemia, unspecified not having achieved remission: Secondary | ICD-10-CM | POA: Diagnosis not present

## 2017-07-21 DIAGNOSIS — E43 Unspecified severe protein-calorie malnutrition: Secondary | ICD-10-CM | POA: Diagnosis not present

## 2017-07-21 DIAGNOSIS — M5030 Other cervical disc degeneration, unspecified cervical region: Secondary | ICD-10-CM | POA: Diagnosis not present

## 2017-07-21 DIAGNOSIS — L988 Other specified disorders of the skin and subcutaneous tissue: Secondary | ICD-10-CM | POA: Diagnosis not present

## 2017-07-21 DIAGNOSIS — C880 Waldenstrom macroglobulinemia: Secondary | ICD-10-CM | POA: Diagnosis not present

## 2017-07-21 DIAGNOSIS — I951 Orthostatic hypotension: Secondary | ICD-10-CM | POA: Diagnosis not present

## 2017-07-21 NOTE — Telephone Encounter (Signed)
Called patient regarding vm

## 2017-07-22 ENCOUNTER — Inpatient Hospital Stay (HOSPITAL_BASED_OUTPATIENT_CLINIC_OR_DEPARTMENT_OTHER): Payer: Medicare Other | Admitting: Hematology

## 2017-07-22 ENCOUNTER — Inpatient Hospital Stay: Payer: Medicare Other

## 2017-07-22 ENCOUNTER — Encounter: Payer: Self-pay | Admitting: Hematology

## 2017-07-22 ENCOUNTER — Inpatient Hospital Stay: Payer: Medicare Other | Attending: Hematology

## 2017-07-22 VITALS — BP 100/74 | HR 53 | Temp 97.7°F | Resp 16

## 2017-07-22 VITALS — BP 92/57 | HR 59 | Temp 97.6°F | Resp 16 | Ht 67.0 in

## 2017-07-22 DIAGNOSIS — Z5112 Encounter for antineoplastic immunotherapy: Secondary | ICD-10-CM | POA: Diagnosis not present

## 2017-07-22 DIAGNOSIS — C880 Waldenstrom macroglobulinemia: Secondary | ICD-10-CM

## 2017-07-22 DIAGNOSIS — I959 Hypotension, unspecified: Secondary | ICD-10-CM | POA: Diagnosis not present

## 2017-07-22 DIAGNOSIS — C8300 Small cell B-cell lymphoma, unspecified site: Secondary | ICD-10-CM

## 2017-07-22 DIAGNOSIS — M549 Dorsalgia, unspecified: Secondary | ICD-10-CM

## 2017-07-22 DIAGNOSIS — Z95828 Presence of other vascular implants and grafts: Secondary | ICD-10-CM

## 2017-07-22 DIAGNOSIS — N049 Nephrotic syndrome with unspecified morphologic changes: Secondary | ICD-10-CM

## 2017-07-22 DIAGNOSIS — D472 Monoclonal gammopathy: Secondary | ICD-10-CM | POA: Diagnosis not present

## 2017-07-22 DIAGNOSIS — E8581 Light chain (AL) amyloidosis: Secondary | ICD-10-CM | POA: Diagnosis not present

## 2017-07-22 DIAGNOSIS — Z7189 Other specified counseling: Secondary | ICD-10-CM

## 2017-07-22 DIAGNOSIS — Z5111 Encounter for antineoplastic chemotherapy: Secondary | ICD-10-CM | POA: Insufficient documentation

## 2017-07-22 DIAGNOSIS — E43 Unspecified severe protein-calorie malnutrition: Secondary | ICD-10-CM | POA: Diagnosis not present

## 2017-07-22 DIAGNOSIS — T80212A Local infection due to central venous catheter, initial encounter: Secondary | ICD-10-CM | POA: Insufficient documentation

## 2017-07-22 DIAGNOSIS — E876 Hypokalemia: Secondary | ICD-10-CM

## 2017-07-22 LAB — CMP (CANCER CENTER ONLY)
ALBUMIN: 1.7 g/dL — AB (ref 3.5–5.0)
ALK PHOS: 233 U/L — AB (ref 40–150)
ALT: 10 U/L (ref 0–55)
ANION GAP: 7 (ref 3–11)
AST: 15 U/L (ref 5–34)
BUN: 21 mg/dL (ref 7–26)
CALCIUM: 8.1 mg/dL — AB (ref 8.4–10.4)
CO2: 31 mmol/L — AB (ref 22–29)
CREATININE: 0.81 mg/dL (ref 0.70–1.30)
Chloride: 98 mmol/L (ref 98–109)
GFR, Est AFR Am: >60 mL/min (ref >60)
GFR, Estimated: >60 mL/min (ref >60)
GLUCOSE: 109 mg/dL (ref 70–140)
Potassium: 2.9 mmol/L — CL (ref 3.5–5.1)
SODIUM: 136 mmol/L (ref 136–145)
Total Bilirubin: 0.4 mg/dL (ref 0.2–1.2)
Total Protein: 4.4 g/dL — ABNORMAL LOW (ref 6.4–8.3)

## 2017-07-22 LAB — CBC
HCT: 33.7 % — ABNORMAL LOW (ref 38.4–49.9)
HEMOGLOBIN: 11 g/dL — AB (ref 13.0–17.1)
MCH: 33.1 pg (ref 27.2–33.4)
MCHC: 32.6 g/dL (ref 32.0–36.0)
MCV: 101.5 fL — ABNORMAL HIGH (ref 79.3–98.0)
Platelets: 300 10*3/uL (ref 140–400)
RBC: 3.32 MIL/uL — ABNORMAL LOW (ref 4.20–5.82)
RDW: 16 % — ABNORMAL HIGH (ref 11.0–14.6)
WBC: 5.9 10*3/uL (ref 4.0–10.3)

## 2017-07-22 LAB — RETICULOCYTES
RBC.: 3.32 MIL/uL — ABNORMAL LOW (ref 4.20–5.82)
RETIC CT PCT: 0.8 % (ref 0.8–1.8)
Retic Count, Absolute: 26.6 10*3/uL — ABNORMAL LOW (ref 34.8–93.9)

## 2017-07-22 MED ORDER — DIPHENHYDRAMINE HCL 25 MG PO CAPS
50.0000 mg | ORAL_CAPSULE | Freq: Once | ORAL | Status: AC
  Administered 2017-07-22: 50 mg via ORAL

## 2017-07-22 MED ORDER — POTASSIUM CHLORIDE 20 MEQ/15ML (10%) PO SOLN
40.0000 meq | ORAL | Status: AC
  Administered 2017-07-22 (×2): 40 meq via ORAL
  Filled 2017-07-22: qty 30

## 2017-07-22 MED ORDER — HEPARIN SOD (PORK) LOCK FLUSH 100 UNIT/ML IV SOLN
500.0000 [IU] | Freq: Once | INTRAVENOUS | Status: AC | PRN
  Administered 2017-07-22: 500 [IU]
  Filled 2017-07-22: qty 5

## 2017-07-22 MED ORDER — SODIUM CHLORIDE 0.9 % IV SOLN
70.0000 mg/m2 | Freq: Once | INTRAVENOUS | Status: AC
  Administered 2017-07-22: 125 mg via INTRAVENOUS
  Filled 2017-07-22: qty 5

## 2017-07-22 MED ORDER — DIPHENHYDRAMINE HCL 25 MG PO CAPS
ORAL_CAPSULE | ORAL | Status: AC
Start: 2017-07-22 — End: 2017-07-22
  Filled 2017-07-22: qty 2

## 2017-07-22 MED ORDER — ACETAMINOPHEN 325 MG PO TABS
ORAL_TABLET | ORAL | Status: AC
  Filled 2017-07-22: qty 2

## 2017-07-22 MED ORDER — SODIUM CHLORIDE 0.9 % IV SOLN
375.0000 mg/m2 | Freq: Once | INTRAVENOUS | Status: AC
  Administered 2017-07-22: 700 mg via INTRAVENOUS
  Filled 2017-07-22: qty 50

## 2017-07-22 MED ORDER — SODIUM CHLORIDE 0.9% FLUSH
10.0000 mL | Freq: Once | INTRAVENOUS | Status: AC
  Administered 2017-07-22: 10 mL
  Filled 2017-07-22: qty 10

## 2017-07-22 MED ORDER — DEXAMETHASONE SODIUM PHOSPHATE 10 MG/ML IJ SOLN
INTRAMUSCULAR | Status: AC
  Filled 2017-07-22: qty 1

## 2017-07-22 MED ORDER — DEXAMETHASONE SODIUM PHOSPHATE 10 MG/ML IJ SOLN
10.0000 mg | Freq: Once | INTRAMUSCULAR | Status: AC
  Administered 2017-07-22: 10 mg via INTRAVENOUS

## 2017-07-22 MED ORDER — SODIUM CHLORIDE 0.9% FLUSH
10.0000 mL | INTRAVENOUS | Status: DC | PRN
  Administered 2017-07-22: 10 mL
  Filled 2017-07-22: qty 10

## 2017-07-22 MED ORDER — PALONOSETRON HCL INJECTION 0.25 MG/5ML
INTRAVENOUS | Status: AC
  Filled 2017-07-22: qty 5

## 2017-07-22 MED ORDER — POTASSIUM CHLORIDE 20 MEQ/15ML (10%) PO SOLN
40.0000 meq | ORAL | Status: DC
  Filled 2017-07-22: qty 30

## 2017-07-22 MED ORDER — ACETAMINOPHEN 325 MG PO TABS
650.0000 mg | ORAL_TABLET | Freq: Once | ORAL | Status: AC
  Administered 2017-07-22: 650 mg via ORAL

## 2017-07-22 MED ORDER — SODIUM CHLORIDE 0.9 % IV SOLN
Freq: Once | INTRAVENOUS | Status: AC
  Administered 2017-07-22: 11:00:00 via INTRAVENOUS

## 2017-07-22 MED ORDER — POTASSIUM CHLORIDE CRYS ER 20 MEQ PO TBCR: 20.0000 meq | EXTENDED_RELEASE_TABLET | Freq: Two times a day (BID) | ORAL | 0 refills | Status: DC

## 2017-07-22 MED ORDER — PALONOSETRON HCL INJECTION 0.25 MG/5ML
0.2500 mg | Freq: Once | INTRAVENOUS | Status: AC
  Administered 2017-07-22: 0.25 mg via INTRAVENOUS

## 2017-07-22 NOTE — Progress Notes (Signed)
Dr. Irene Limbo okay to tx with K 2.9, HR 59, and BP 92/57. To tx hypokalemia, pt to be given 73mq of oral potassium x 2 (2 hours apart). No albumin today to elevate BP. Pt BP at baseline today. KMerleen Nicely RN notified. JEliezer Lofts RLowery A Woodall Outpatient Surgery Facility LLCgiven clarification for potassium order as requested.

## 2017-07-22 NOTE — Patient Instructions (Signed)
[facility] Discharge Instructions for Patients Receiving Chemotherapy  Today you received the following chemotherapy agents Rituxan and Bendeka  To help prevent nausea and vomiting after your treatment, we encourage you to take your nausea medication as directed   If you develop nausea and vomiting that is not controlled by your nausea medication, call the clinic.   BELOW ARE SYMPTOMS THAT SHOULD BE REPORTED IMMEDIATELY:  *FEVER GREATER THAN 100.5 F  *CHILLS WITH OR WITHOUT FEVER  NAUSEA AND VOMITING THAT IS NOT CONTROLLED WITH YOUR NAUSEA MEDICATION  *UNUSUAL SHORTNESS OF BREATH  *UNUSUAL BRUISING OR BLEEDING  TENDERNESS IN MOUTH AND THROAT WITH OR WITHOUT PRESENCE OF ULCERS  *URINARY PROBLEMS  *BOWEL PROBLEMS  UNUSUAL RASH Items with * indicate a potential emergency and should be followed up as soon as possible.  Feel free to call the clinic should you have any questions or concerns. The clinic phone number is [phone].  Please show the [place] at check-in to the Emergency Department and triage nurse.   Potassium Content of Foods Potassium is a mineral found in many foods and drinks. It helps keep fluids and minerals balanced in your body and affects how steadily your heart beats. Potassium also helps control your blood pressure and keep your muscles and nervous system healthy. Certain health conditions and medicines may change the balance of potassium in your body. When this happens, you can help balance your level of potassium through the foods that you do or do not eat. Your health care provider or dietitian may recommend an amount of potassium that you should have each day. The following lists of foods provide the amount of potassium (in parentheses) per serving in each item. High in potassium The following foods and beverages have 200 mg or more of potassium per serving:  Apricots, 2 raw or 5 dry (200 mg).  Artichoke, 1 medium (345  mg).  Avocado, raw,  each (245 mg).  Banana, 1 medium (425 mg).  Beans, lima, or baked beans, canned,  cup (280 mg).  Beans, white, canned,  cup (595 mg).  Beef roast, 3 oz (320 mg).  Beef, ground, 3 oz (270 mg).  Beets, raw or cooked,  cup (260 mg).  Bran muffin, 2 oz (300 mg).  Broccoli,  cup (230 mg).  Brussels sprouts,  cup (250 mg).  Cantaloupe,  cup (215 mg).  Cereal, 100% bran,  cup (200-400 mg).  Cheeseburger, single, fast food, 1 each (225-400 mg).  Chicken, 3 oz (220 mg).  Clams, canned, 3 oz (535 mg).  Crab, 3 oz (225 mg).  Dates, 5 each (270 mg).  Dried beans and peas,  cup (300-475 mg).  Figs, dried, 2 each (260 mg).  Fish: halibut, tuna, cod, snapper, 3 oz (480 mg).  Fish: salmon, haddock, swordfish, perch, 3 oz (300 mg).  Fish, tuna, canned 3 oz (200 mg).  [place] fries, fast food, 3 oz (470 mg).  Granola with fruit and nuts,  cup (200 mg).  Grapefruit juice,  cup (200 mg).  Greens, beet,  cup (655 mg).  Honeydew melon,  cup (200 mg).  Kale, raw, 1 cup (300 mg).  Kiwi, 1 medium (240 mg).  Kohlrabi, rutabaga, parsnips,  cup (280 mg).  Lentils,  cup (365 mg).  Mango, 1 each (325 mg).  Milk, chocolate, 1 cup (420 mg).  Milk: nonfat, low-fat, whole, buttermilk, 1 cup (350-380 mg).  Molasses, 1 Tbsp (295 mg).  Mushrooms,  cup (280) mg.  Nectarine,  1 each (275 mg).  Nuts: almonds, peanuts, hazelnuts, [place], cashew, mixed, 1 oz (200 mg).  Nuts, pistachios, 1 oz (295 mg).  Orange, 1 each (240 mg).  Orange juice,  cup (235 mg).  Papaya, medium,  fruit (390 mg).  Peanut butter, chunky, 2 Tbsp (240 mg).  Peanut butter, smooth, 2 Tbsp (210 mg).  Pear, 1 medium (200 mg).  Pomegranate, 1 whole (400 mg).  Pomegranate juice,  cup (215 mg).  Pork, 3 oz (350 mg).  Potato chips, salted, 1 oz (465 mg).  Potato, baked with skin, 1 medium (925 mg).  Potatoes, boiled,  cup (255 mg).  Potatoes, mashed,   cup (330 mg).  Prune juice,  cup (370 mg).  Prunes, 5 each (305 mg).  Pudding, chocolate,  cup (230 mg).  Pumpkin, canned,  cup (250 mg).  Raisins, seedless,  cup (270 mg).  Seeds, sunflower or pumpkin, 1 oz (240 mg).  Soy milk, 1 cup (300 mg).  Spinach,  cup (420 mg).  Spinach, canned,  cup (370 mg).  Sweet potato, baked with skin, 1 medium (450 mg).  Swiss chard,  cup (480 mg).  Tomato or vegetable juice,  cup (275 mg).  Tomato sauce or puree,  cup (400-550 mg).  Tomato, raw, 1 medium (290 mg).  Tomatoes, canned,  cup (200-300 mg).  [place], 3 oz (250 mg).  Wheat germ, 1 oz (250 mg).  Winter squash,  cup (250 mg).  Yogurt, plain or fruited, 6 oz (260-435 mg).  Zucchini,  cup (220 mg).  Moderate in potassium The following foods and beverages have 50-200 mg of potassium per serving:  Apple, 1 each (150 mg).  Apple juice,  cup (150 mg).  Applesauce,  cup (90 mg).  Apricot nectar,  cup (140 mg).  Asparagus, small spears,  cup or 6 spears (155 mg).  Bagel, cinnamon raisin, 1 each (130 mg).  Bagel, egg or plain, 4 in., 1 each (70 mg).  Beans, green,  cup (90 mg).  Beans, yellow,  cup (190 mg).  Beer, regular, 12 oz (100 mg).  Beets, canned,  cup (125 mg).  Blackberries,  cup (115 mg).  Blueberries,  cup (60 mg).  Bread, whole wheat, 1 slice (70 mg).  Broccoli, raw,  cup (145 mg).  Cabbage,  cup (150 mg).  Carrots, cooked or raw,  cup (180 mg).  Cauliflower, raw,  cup (150 mg).  Celery, raw,  cup (155 mg).  Cereal, bran flakes, cup (120-150 mg).  Cheese, cottage,  cup (110 mg).  Cherries, 10 each (150 mg).  Chocolate, 1 oz bar (165 mg).  Coffee, brewed 6 oz (90 mg).  Corn,  cup or 1 ear (195 mg).  Cucumbers,  cup (80 mg).  Egg, large, 1 each (60 mg).  Eggplant,  cup (60 mg).  Endive, raw, cup (80 mg).  English muffin, 1 each (65 mg).  Fish, orange roughy, 3 oz (150  mg).  Frankfurter, beef or pork, 1 each (75 mg).  Fruit cocktail,  cup (115 mg).  Grape juice,  cup (170 mg).  Grapefruit,  fruit (175 mg).  Grapes,  cup (155 mg).  Greens: kale, turnip, collard,  cup (110-150 mg).  Ice cream or frozen yogurt, chocolate,  cup (175 mg).  Ice cream or frozen yogurt, vanilla,  cup (120-150 mg).  Lemons, limes, 1 each (80 mg).  Lettuce, all types, 1 cup (100 mg).  Mixed vegetables,  cup (150 mg).  Mushrooms, raw,  cup (110 mg).  Nuts:  walnuts, pecans, or macadamia, 1 oz (125 mg).  Oatmeal,  cup (80 mg).  Okra,  cup (110 mg).  Onions, raw,  cup (120 mg).  Peach, 1 each (185 mg).  Peaches, canned,  cup (120 mg).  Pears, canned,  cup (120 mg).  Peas, green, frozen,  cup (90 mg).  Peppers, green,  cup (130 mg).  Peppers, red,  cup (160 mg).  Pineapple juice,  cup (165 mg).  Pineapple, fresh or canned,  cup (100 mg).  Plums, 1 each (105 mg).  Pudding, vanilla,  cup (150 mg).  Raspberries,  cup (90 mg).  Rhubarb,  cup (115 mg).  Rice, wild,  cup (80 mg).  Shrimp, 3 oz (155 mg).  Spinach, raw, 1 cup (170 mg).  Strawberries,  cup (125 mg).  Summer squash  cup (175-200 mg).  Swiss chard, raw, 1 cup (135 mg).  Tangerines, 1 each (140 mg).  Tea, brewed, 6 oz (65 mg).  Turnips,  cup (140 mg).  Watermelon,  cup (85 mg).  Wine, red, table, 5 oz (180 mg).  Wine, white, table, 5 oz (100 mg).  Low in potassium The following foods and beverages have less than 50 mg of potassium per serving.  Bread, white, 1 slice (30 mg).  Carbonated beverages, 12 oz (less than 5 mg).  Cheese, 1 oz (20-30 mg).  Cranberries,  cup (45 mg).  Cranberry juice cocktail,  cup (20 mg).  Fats and oils, 1 Tbsp (less than 5 mg).  Hummus, 1 Tbsp (32 mg).  Nectar: papaya, mango, or pear,  cup (35 mg).  Rice, white or brown,  cup (50 mg).  Spaghetti or macaroni,  cup cooked (30 mg).  Tortilla, flour  or corn, 1 each (50 mg).  Waffle, 4 in., 1 each (50 mg).  Water chestnuts,  cup (40 mg).  This information is not intended to replace advice given to you by your health care provider. Make sure you discuss any questions you have with your health care provider. Document Released: 12/11/2004 Document Revised: 10/05/2015 Document Reviewed: 03/26/2013 Elsevier Interactive Patient Education  [employer].

## 2017-07-23 ENCOUNTER — Telehealth: Payer: Self-pay | Admitting: Hematology

## 2017-07-23 ENCOUNTER — Inpatient Hospital Stay: Payer: Medicare Other

## 2017-07-23 VITALS — BP 92/61 | HR 67 | Temp 98.1°F | Resp 16

## 2017-07-23 DIAGNOSIS — Z5111 Encounter for antineoplastic chemotherapy: Secondary | ICD-10-CM | POA: Diagnosis not present

## 2017-07-23 DIAGNOSIS — C880 Waldenstrom macroglobulinemia: Secondary | ICD-10-CM | POA: Diagnosis not present

## 2017-07-23 DIAGNOSIS — Z5112 Encounter for antineoplastic immunotherapy: Secondary | ICD-10-CM | POA: Diagnosis not present

## 2017-07-23 DIAGNOSIS — E8581 Light chain (AL) amyloidosis: Secondary | ICD-10-CM | POA: Diagnosis not present

## 2017-07-23 DIAGNOSIS — C8300 Small cell B-cell lymphoma, unspecified site: Secondary | ICD-10-CM

## 2017-07-23 DIAGNOSIS — Z7189 Other specified counseling: Secondary | ICD-10-CM

## 2017-07-23 LAB — KAPPA/LAMBDA LIGHT CHAINS
KAPPA, LAMDA LIGHT CHAIN RATIO: 5.07 — AB (ref 0.26–1.65)
Kappa free light chain: 75.6 mg/L — ABNORMAL HIGH (ref 3.3–19.4)
Lambda free light chains: 14.9 mg/L (ref 5.7–26.3)

## 2017-07-23 MED ORDER — DEXAMETHASONE SODIUM PHOSPHATE 10 MG/ML IJ SOLN
10.0000 mg | Freq: Once | INTRAMUSCULAR | Status: AC
  Administered 2017-07-23: 10 mg via INTRAVENOUS

## 2017-07-23 MED ORDER — DEXAMETHASONE SODIUM PHOSPHATE 10 MG/ML IJ SOLN
INTRAMUSCULAR | Status: AC
  Filled 2017-07-23: qty 1

## 2017-07-23 MED ORDER — PEGFILGRASTIM 6 MG/0.6ML ~~LOC~~ PSKT
PREFILLED_SYRINGE | SUBCUTANEOUS | Status: AC
  Filled 2017-07-23: qty 0.6

## 2017-07-23 MED ORDER — HEPARIN SOD (PORK) LOCK FLUSH 100 UNIT/ML IV SOLN
500.0000 [IU] | Freq: Once | INTRAVENOUS | Status: AC | PRN
  Administered 2017-07-23: 500 [IU]
  Filled 2017-07-23: qty 5

## 2017-07-23 MED ORDER — SODIUM CHLORIDE 0.9 % IV SOLN
70.0000 mg/m2 | Freq: Once | INTRAVENOUS | Status: AC
  Administered 2017-07-23: 125 mg via INTRAVENOUS
  Filled 2017-07-23: qty 5

## 2017-07-23 MED ORDER — SODIUM CHLORIDE 0.9 % IV SOLN
Freq: Once | INTRAVENOUS | Status: AC
  Administered 2017-07-23: 11:00:00 via INTRAVENOUS

## 2017-07-23 MED ORDER — SODIUM CHLORIDE 0.9% FLUSH
10.0000 mL | INTRAVENOUS | Status: DC | PRN
  Administered 2017-07-23: 10 mL
  Filled 2017-07-23: qty 10

## 2017-07-23 MED ORDER — PEGFILGRASTIM 6 MG/0.6ML ~~LOC~~ PSKT
6.0000 mg | PREFILLED_SYRINGE | Freq: Once | SUBCUTANEOUS | Status: AC
  Administered 2017-07-23: 6 mg via SUBCUTANEOUS

## 2017-07-23 NOTE — Patient Instructions (Signed)
[facility] Discharge Instructions for Patients Receiving Chemotherapy  Today you received the following chemotherapy agents Bendeka.  To help prevent nausea and vomiting after your treatment, we encourage you to take your nausea medication as directed.   If you develop nausea and vomiting that is not controlled by your nausea medication, call the clinic.   BELOW ARE SYMPTOMS THAT SHOULD BE REPORTED IMMEDIATELY:  *FEVER GREATER THAN 100.5 F  *CHILLS WITH OR WITHOUT FEVER  NAUSEA AND VOMITING THAT IS NOT CONTROLLED WITH YOUR NAUSEA MEDICATION  *UNUSUAL SHORTNESS OF BREATH  *UNUSUAL BRUISING OR BLEEDING  TENDERNESS IN MOUTH AND THROAT WITH OR WITHOUT PRESENCE OF ULCERS  *URINARY PROBLEMS  *BOWEL PROBLEMS  UNUSUAL RASH Items with * indicate a potential emergency and should be followed up as soon as possible.  Feel free to call the clinic should you have any questions or concerns. The clinic phone number is [phone].  Please show the CHEMO ALERT CARD at check-in to the Emergency Department and triage nurse.   

## 2017-07-23 NOTE — Telephone Encounter (Signed)
Scheduled appt per 3/12 los - Gave patient aVS and calender per los. -

## 2017-07-24 DIAGNOSIS — I739 Peripheral vascular disease, unspecified: Secondary | ICD-10-CM | POA: Diagnosis not present

## 2017-07-24 LAB — MULTIPLE MYELOMA PANEL, SERUM
ALBUMIN SERPL ELPH-MCNC: 1.8 g/dL — AB (ref 2.9–4.4)
ALPHA 1: 0.2 g/dL (ref 0.0–0.4)
Albumin/Glob SerPl: 0.9 (ref 0.7–1.7)
Alpha2 Glob SerPl Elph-Mcnc: 0.7 g/dL (ref 0.4–1.0)
B-Globulin SerPl Elph-Mcnc: 0.7 g/dL (ref 0.7–1.3)
Gamma Glob SerPl Elph-Mcnc: 0.6 g/dL (ref 0.4–1.8)
Globulin, Total: 2.2 g/dL (ref 2.2–3.9)
IGA: 35 mg/dL — AB (ref 61–437)
IGM (IMMUNOGLOBULIN M), SRM: 614 mg/dL — AB (ref 15–143)
IgG (Immunoglobin G), Serum: 240 mg/dL — ABNORMAL LOW (ref 700–1600)
M Protein SerPl Elph-Mcnc: 0.4 g/dL — ABNORMAL HIGH
Total Protein ELP: 4 g/dL — ABNORMAL LOW (ref 6.0–8.5)

## 2017-07-25 DIAGNOSIS — J449 Chronic obstructive pulmonary disease, unspecified: Secondary | ICD-10-CM | POA: Diagnosis not present

## 2017-07-28 DIAGNOSIS — I481 Persistent atrial fibrillation: Secondary | ICD-10-CM | POA: Diagnosis not present

## 2017-07-28 DIAGNOSIS — M5030 Other cervical disc degeneration, unspecified cervical region: Secondary | ICD-10-CM | POA: Diagnosis not present

## 2017-07-28 DIAGNOSIS — E43 Unspecified severe protein-calorie malnutrition: Secondary | ICD-10-CM | POA: Diagnosis not present

## 2017-07-28 DIAGNOSIS — I110 Hypertensive heart disease with heart failure: Secondary | ICD-10-CM | POA: Diagnosis not present

## 2017-07-28 DIAGNOSIS — L988 Other specified disorders of the skin and subcutaneous tissue: Secondary | ICD-10-CM | POA: Diagnosis not present

## 2017-07-28 DIAGNOSIS — I951 Orthostatic hypotension: Secondary | ICD-10-CM | POA: Diagnosis not present

## 2017-07-28 DIAGNOSIS — J439 Emphysema, unspecified: Secondary | ICD-10-CM | POA: Diagnosis not present

## 2017-07-28 DIAGNOSIS — I5042 Chronic combined systolic (congestive) and diastolic (congestive) heart failure: Secondary | ICD-10-CM | POA: Diagnosis not present

## 2017-07-28 DIAGNOSIS — C9190 Lymphoid leukemia, unspecified not having achieved remission: Secondary | ICD-10-CM | POA: Diagnosis not present

## 2017-07-28 DIAGNOSIS — C880 Waldenstrom macroglobulinemia: Secondary | ICD-10-CM | POA: Diagnosis not present

## 2017-07-28 DIAGNOSIS — I739 Peripheral vascular disease, unspecified: Secondary | ICD-10-CM | POA: Diagnosis not present

## 2017-07-29 DIAGNOSIS — J439 Emphysema, unspecified: Secondary | ICD-10-CM | POA: Diagnosis not present

## 2017-07-29 DIAGNOSIS — C880 Waldenstrom macroglobulinemia: Secondary | ICD-10-CM | POA: Diagnosis not present

## 2017-07-29 DIAGNOSIS — I739 Peripheral vascular disease, unspecified: Secondary | ICD-10-CM | POA: Diagnosis not present

## 2017-07-29 DIAGNOSIS — L988 Other specified disorders of the skin and subcutaneous tissue: Secondary | ICD-10-CM | POA: Diagnosis not present

## 2017-07-29 DIAGNOSIS — I481 Persistent atrial fibrillation: Secondary | ICD-10-CM | POA: Diagnosis not present

## 2017-07-29 DIAGNOSIS — I110 Hypertensive heart disease with heart failure: Secondary | ICD-10-CM | POA: Diagnosis not present

## 2017-07-29 DIAGNOSIS — I5042 Chronic combined systolic (congestive) and diastolic (congestive) heart failure: Secondary | ICD-10-CM | POA: Diagnosis not present

## 2017-07-29 DIAGNOSIS — C9190 Lymphoid leukemia, unspecified not having achieved remission: Secondary | ICD-10-CM | POA: Diagnosis not present

## 2017-07-29 DIAGNOSIS — E43 Unspecified severe protein-calorie malnutrition: Secondary | ICD-10-CM | POA: Diagnosis not present

## 2017-07-29 DIAGNOSIS — M5030 Other cervical disc degeneration, unspecified cervical region: Secondary | ICD-10-CM | POA: Diagnosis not present

## 2017-07-29 DIAGNOSIS — I951 Orthostatic hypotension: Secondary | ICD-10-CM | POA: Diagnosis not present

## 2017-07-30 DIAGNOSIS — I481 Persistent atrial fibrillation: Secondary | ICD-10-CM | POA: Diagnosis not present

## 2017-07-30 DIAGNOSIS — C880 Waldenstrom macroglobulinemia: Secondary | ICD-10-CM | POA: Diagnosis not present

## 2017-07-30 DIAGNOSIS — I739 Peripheral vascular disease, unspecified: Secondary | ICD-10-CM | POA: Diagnosis not present

## 2017-07-30 DIAGNOSIS — I951 Orthostatic hypotension: Secondary | ICD-10-CM | POA: Diagnosis not present

## 2017-07-30 DIAGNOSIS — I110 Hypertensive heart disease with heart failure: Secondary | ICD-10-CM | POA: Diagnosis not present

## 2017-07-30 DIAGNOSIS — C9190 Lymphoid leukemia, unspecified not having achieved remission: Secondary | ICD-10-CM | POA: Diagnosis not present

## 2017-07-30 DIAGNOSIS — M5030 Other cervical disc degeneration, unspecified cervical region: Secondary | ICD-10-CM | POA: Diagnosis not present

## 2017-07-30 DIAGNOSIS — J439 Emphysema, unspecified: Secondary | ICD-10-CM | POA: Diagnosis not present

## 2017-07-30 DIAGNOSIS — I5042 Chronic combined systolic (congestive) and diastolic (congestive) heart failure: Secondary | ICD-10-CM | POA: Diagnosis not present

## 2017-07-30 DIAGNOSIS — L988 Other specified disorders of the skin and subcutaneous tissue: Secondary | ICD-10-CM | POA: Diagnosis not present

## 2017-07-30 DIAGNOSIS — E43 Unspecified severe protein-calorie malnutrition: Secondary | ICD-10-CM | POA: Diagnosis not present

## 2017-07-31 ENCOUNTER — Telehealth: Payer: Self-pay

## 2017-07-31 NOTE — Telephone Encounter (Signed)
Received call from patient and spouse. Spouse reports swollen foreskin of penis last night (07/30/17); however, reports resolution during inspection today (07/31/17). Denies pain or feeling of irritation. Patient questions if this is related to chemotherapy. Spouse also verbalized being made aware of unexpected prescription of potassium after visit to pharmacy on yesterday. Spouse states patient begin taking potassium on 07/30/17. Spouse and patient questions if low blood pressure may be related to potassium supplements. Spouse reports recent blood pressure for patient of 78/55. Patient checked blood pressure while on phone with RN and obtained reading of 98/66. Patient denies headache, dizziness, nausea, or vomiting. Patient and spouse encouraged to return call back if blood pressure drops that low again and if symptoms recur. Patient and spouse verbalized understanding and denies questions or concerns on call end.

## 2017-07-31 NOTE — Telephone Encounter (Signed)
Faxed ROI to [facility] per patient and received fax today stating no results were found for Colonoscopy and pathology. ROI sent to be scanned into Epic.

## 2017-08-01 DIAGNOSIS — I739 Peripheral vascular disease, unspecified: Secondary | ICD-10-CM | POA: Diagnosis not present

## 2017-08-01 DIAGNOSIS — J439 Emphysema, unspecified: Secondary | ICD-10-CM | POA: Diagnosis not present

## 2017-08-01 DIAGNOSIS — M5030 Other cervical disc degeneration, unspecified cervical region: Secondary | ICD-10-CM | POA: Diagnosis not present

## 2017-08-01 DIAGNOSIS — I110 Hypertensive heart disease with heart failure: Secondary | ICD-10-CM | POA: Diagnosis not present

## 2017-08-01 DIAGNOSIS — I951 Orthostatic hypotension: Secondary | ICD-10-CM | POA: Diagnosis not present

## 2017-08-01 DIAGNOSIS — I5042 Chronic combined systolic (congestive) and diastolic (congestive) heart failure: Secondary | ICD-10-CM | POA: Diagnosis not present

## 2017-08-01 DIAGNOSIS — C880 Waldenstrom macroglobulinemia: Secondary | ICD-10-CM | POA: Diagnosis not present

## 2017-08-01 DIAGNOSIS — I481 Persistent atrial fibrillation: Secondary | ICD-10-CM | POA: Diagnosis not present

## 2017-08-01 DIAGNOSIS — C9190 Lymphoid leukemia, unspecified not having achieved remission: Secondary | ICD-10-CM | POA: Diagnosis not present

## 2017-08-01 DIAGNOSIS — L988 Other specified disorders of the skin and subcutaneous tissue: Secondary | ICD-10-CM | POA: Diagnosis not present

## 2017-08-01 DIAGNOSIS — E43 Unspecified severe protein-calorie malnutrition: Secondary | ICD-10-CM | POA: Diagnosis not present

## 2017-08-04 DIAGNOSIS — I739 Peripheral vascular disease, unspecified: Secondary | ICD-10-CM | POA: Diagnosis not present

## 2017-08-04 DIAGNOSIS — L988 Other specified disorders of the skin and subcutaneous tissue: Secondary | ICD-10-CM | POA: Diagnosis not present

## 2017-08-04 DIAGNOSIS — C9190 Lymphoid leukemia, unspecified not having achieved remission: Secondary | ICD-10-CM | POA: Diagnosis not present

## 2017-08-04 DIAGNOSIS — C880 Waldenstrom macroglobulinemia: Secondary | ICD-10-CM | POA: Diagnosis not present

## 2017-08-04 DIAGNOSIS — I5042 Chronic combined systolic (congestive) and diastolic (congestive) heart failure: Secondary | ICD-10-CM | POA: Diagnosis not present

## 2017-08-04 DIAGNOSIS — M5030 Other cervical disc degeneration, unspecified cervical region: Secondary | ICD-10-CM | POA: Diagnosis not present

## 2017-08-04 DIAGNOSIS — J439 Emphysema, unspecified: Secondary | ICD-10-CM | POA: Diagnosis not present

## 2017-08-04 DIAGNOSIS — E43 Unspecified severe protein-calorie malnutrition: Secondary | ICD-10-CM | POA: Diagnosis not present

## 2017-08-04 DIAGNOSIS — I481 Persistent atrial fibrillation: Secondary | ICD-10-CM | POA: Diagnosis not present

## 2017-08-04 DIAGNOSIS — I951 Orthostatic hypotension: Secondary | ICD-10-CM | POA: Diagnosis not present

## 2017-08-04 DIAGNOSIS — I110 Hypertensive heart disease with heart failure: Secondary | ICD-10-CM | POA: Diagnosis not present

## 2017-08-05 ENCOUNTER — Telehealth: Payer: Self-pay | Admitting: Internal Medicine

## 2017-08-05 ENCOUNTER — Telehealth: Payer: Self-pay

## 2017-08-05 DIAGNOSIS — M5030 Other cervical disc degeneration, unspecified cervical region: Secondary | ICD-10-CM | POA: Diagnosis not present

## 2017-08-05 DIAGNOSIS — I739 Peripheral vascular disease, unspecified: Secondary | ICD-10-CM | POA: Diagnosis not present

## 2017-08-05 DIAGNOSIS — I110 Hypertensive heart disease with heart failure: Secondary | ICD-10-CM | POA: Diagnosis not present

## 2017-08-05 DIAGNOSIS — I951 Orthostatic hypotension: Secondary | ICD-10-CM | POA: Diagnosis not present

## 2017-08-05 DIAGNOSIS — C9190 Lymphoid leukemia, unspecified not having achieved remission: Secondary | ICD-10-CM | POA: Diagnosis not present

## 2017-08-05 DIAGNOSIS — J439 Emphysema, unspecified: Secondary | ICD-10-CM | POA: Diagnosis not present

## 2017-08-05 DIAGNOSIS — I5042 Chronic combined systolic (congestive) and diastolic (congestive) heart failure: Secondary | ICD-10-CM | POA: Diagnosis not present

## 2017-08-05 DIAGNOSIS — I481 Persistent atrial fibrillation: Secondary | ICD-10-CM | POA: Diagnosis not present

## 2017-08-05 DIAGNOSIS — E43 Unspecified severe protein-calorie malnutrition: Secondary | ICD-10-CM | POA: Diagnosis not present

## 2017-08-05 DIAGNOSIS — C880 Waldenstrom macroglobulinemia: Secondary | ICD-10-CM | POA: Diagnosis not present

## 2017-08-05 DIAGNOSIS — L988 Other specified disorders of the skin and subcutaneous tissue: Secondary | ICD-10-CM | POA: Diagnosis not present

## 2017-08-05 MED ORDER — HYDROCODONE-ACETAMINOPHEN 10-325 MG PO TABS: 1.0000 | ORAL_TABLET | Freq: Four times a day (QID) | ORAL | 0 refills | Status: DC | PRN

## 2017-08-05 NOTE — Telephone Encounter (Signed)
Copied from [place] [id number]. Topic: Quick Communication - Rx Refill/Question >> Aug 05, 2017  9:42 AM Boyd Kerbs wrote: Medication: HYDROcodone-acetaminophen (NORCO) 10-325 MG tablet Asking if can up the medication a little possible 15 or 20 mg - 10 mg is not helping much.  Wife is wanting to asking if he can take Melatonin instead of Trazodone,  This is too strong for him, and wants to be sure of melatonin with his blood thinner medicine.  (traZODone (DESYREL) 50 MG tablet, he wants to stop this)   He is wanting apple cider vinagar 2 tablespoons a day, is this ok.   Has the patient contacted their pharmacy? Yes.   (Agent: If no, request that the patient contact the pharmacy for the refill.)  Preferred Pharmacy (with phone number or street name):  [facility] [phone] [address], [address] AT [address] [place] [address] Phone: [phone] Fax: [phone]   Agent: Please be advised that RX refills may take up to 3 business days. We ask that you follow-up with your pharmacy. >> Aug 05, 2017  1:20 PM Neva Seat wrote: Pt's wife called in to say pharmacy says they cannot send a refill request for this type of RX.  Please call in the refill for the Rx to be filled.

## 2017-08-05 NOTE — Telephone Encounter (Signed)
Done erx 

## 2017-08-05 NOTE — Telephone Encounter (Signed)
Notified pt/wife with MD response. Wife states he is also needing to get a refill on his Hydrocodone he only have 2 pills left,.../lmb

## 2017-08-05 NOTE — Telephone Encounter (Signed)
Pt called this morning requesting okay from MD or RN to add apple cidar vinegar and melatonin OTC to his regimen. Pt interested in adding vinegar to become more alkaline. Per Dr. Irene Limbo, the vinegar would increase acidity and would not be recommended at this time d/t pt issue with fluid overload. Pt should be in touch with pain clinic who is currently managing pain medications to ask about addition of melatonin.   Called back and spoke with wife to relay information. Encouraged her and pt to discuss pH and acidity with PCP because apple cidar vinegar and prune juice/other fruits typically are more acidic, and pt seems confused that based on YouTube video that these would increase alkalinity. Pt wife verbalized understanding and that she has already left a VM with pt PCP who prescribes pt's pain medications and will discuss with his office. Pt's wife verbalized thanks for the information.

## 2017-08-05 NOTE — Telephone Encounter (Signed)
Duplicate.

## 2017-08-05 NOTE — Telephone Encounter (Signed)
Copied from [place] [id number]. Topic: Quick Communication - Rx Refill/Question >> Aug 05, 2017  9:42 AM Boyd Kerbs wrote:  Medication: HYDROcodone-acetaminophen (NORCO) 10-325 MG tablet Asking if can up the medication a little possible 15 or 20 mg - 10 mg is not helping much.  Wife is wanting to asking if he can take Melatonin instead of Trazodone,  This is too strong for him, and wants to be sure of melatonin with his blood thinner medicine.  (traZODone (DESYREL) 50 MG tablet, he wants to stop this)   He is wanting to take apple cider vinagar 4 tablespoons a day with water, is this ok.   Please call wife, [name]  Has the patient contacted their pharmacy? Yes.   (Agent: If no, request that the patient contact the pharmacy for the refill.)  Preferred Pharmacy (with phone number or street name):  [facility] [phone] [address], [address] [place] [address] Phone: [phone] Fax: [phone]   Agent: Please be advised that RX refills may take up to 3 business days. We ask that you follow-up with your pharmacy.

## 2017-08-05 NOTE — Telephone Encounter (Signed)
Very sorry, but hydrocodone 10 is the highest approved dose, so I should not change  Also, ok to take the melatonin as he stated

## 2017-08-07 ENCOUNTER — Telehealth: Payer: Self-pay | Admitting: Internal Medicine

## 2017-08-07 DIAGNOSIS — I5042 Chronic combined systolic (congestive) and diastolic (congestive) heart failure: Secondary | ICD-10-CM | POA: Diagnosis not present

## 2017-08-07 DIAGNOSIS — M5030 Other cervical disc degeneration, unspecified cervical region: Secondary | ICD-10-CM | POA: Diagnosis not present

## 2017-08-07 DIAGNOSIS — I481 Persistent atrial fibrillation: Secondary | ICD-10-CM | POA: Diagnosis not present

## 2017-08-07 DIAGNOSIS — J439 Emphysema, unspecified: Secondary | ICD-10-CM | POA: Diagnosis not present

## 2017-08-07 DIAGNOSIS — C9190 Lymphoid leukemia, unspecified not having achieved remission: Secondary | ICD-10-CM | POA: Diagnosis not present

## 2017-08-07 DIAGNOSIS — I951 Orthostatic hypotension: Secondary | ICD-10-CM | POA: Diagnosis not present

## 2017-08-07 DIAGNOSIS — I110 Hypertensive heart disease with heart failure: Secondary | ICD-10-CM | POA: Diagnosis not present

## 2017-08-07 DIAGNOSIS — I739 Peripheral vascular disease, unspecified: Secondary | ICD-10-CM | POA: Diagnosis not present

## 2017-08-07 DIAGNOSIS — C880 Waldenstrom macroglobulinemia: Secondary | ICD-10-CM | POA: Diagnosis not present

## 2017-08-07 DIAGNOSIS — L988 Other specified disorders of the skin and subcutaneous tissue: Secondary | ICD-10-CM | POA: Diagnosis not present

## 2017-08-07 DIAGNOSIS — E43 Unspecified severe protein-calorie malnutrition: Secondary | ICD-10-CM | POA: Diagnosis not present

## 2017-08-07 NOTE — Telephone Encounter (Signed)
[place] for verbal

## 2017-08-07 NOTE — Telephone Encounter (Signed)
Verbal orders given

## 2017-08-07 NOTE — Telephone Encounter (Signed)
Copied from [place] [id number]. Topic: General - Other >> Aug 07, 2017 11:39 AM Carolyn Stare wrote:  Lattie Haw with [facility] call to report that pt fell on 08/05/17 has new injuries woud on back  would like orders to treat the would  [place]   [phone]

## 2017-08-09 ENCOUNTER — Other Ambulatory Visit: Payer: Self-pay | Admitting: Hematology

## 2017-08-09 DIAGNOSIS — C8300 Small cell B-cell lymphoma, unspecified site: Secondary | ICD-10-CM

## 2017-08-10 DIAGNOSIS — R55 Syncope and collapse: Secondary | ICD-10-CM | POA: Diagnosis not present

## 2017-08-10 DIAGNOSIS — I5033 Acute on chronic diastolic (congestive) heart failure: Secondary | ICD-10-CM | POA: Diagnosis not present

## 2017-08-10 DIAGNOSIS — C8300 Small cell B-cell lymphoma, unspecified site: Secondary | ICD-10-CM | POA: Diagnosis not present

## 2017-08-11 ENCOUNTER — Telehealth: Payer: Self-pay | Admitting: Internal Medicine

## 2017-08-11 ENCOUNTER — Other Ambulatory Visit: Payer: Self-pay

## 2017-08-11 DIAGNOSIS — C880 Waldenstrom macroglobulinemia: Secondary | ICD-10-CM | POA: Diagnosis not present

## 2017-08-11 DIAGNOSIS — J439 Emphysema, unspecified: Secondary | ICD-10-CM | POA: Diagnosis not present

## 2017-08-11 DIAGNOSIS — I110 Hypertensive heart disease with heart failure: Secondary | ICD-10-CM | POA: Diagnosis not present

## 2017-08-11 DIAGNOSIS — I5042 Chronic combined systolic (congestive) and diastolic (congestive) heart failure: Secondary | ICD-10-CM | POA: Diagnosis not present

## 2017-08-11 DIAGNOSIS — C9190 Lymphoid leukemia, unspecified not having achieved remission: Secondary | ICD-10-CM | POA: Diagnosis not present

## 2017-08-11 DIAGNOSIS — I739 Peripheral vascular disease, unspecified: Secondary | ICD-10-CM | POA: Diagnosis not present

## 2017-08-11 DIAGNOSIS — I951 Orthostatic hypotension: Secondary | ICD-10-CM | POA: Diagnosis not present

## 2017-08-11 DIAGNOSIS — I481 Persistent atrial fibrillation: Secondary | ICD-10-CM | POA: Diagnosis not present

## 2017-08-11 DIAGNOSIS — C8300 Small cell B-cell lymphoma, unspecified site: Secondary | ICD-10-CM

## 2017-08-11 DIAGNOSIS — M5030 Other cervical disc degeneration, unspecified cervical region: Secondary | ICD-10-CM | POA: Diagnosis not present

## 2017-08-11 DIAGNOSIS — L988 Other specified disorders of the skin and subcutaneous tissue: Secondary | ICD-10-CM | POA: Diagnosis not present

## 2017-08-11 DIAGNOSIS — E43 Unspecified severe protein-calorie malnutrition: Secondary | ICD-10-CM | POA: Diagnosis not present

## 2017-08-11 MED ORDER — ACYCLOVIR 400 MG PO TABS: 400.0000 mg | ORAL_TABLET | Freq: Every day | ORAL | 3 refills | Status: AC

## 2017-08-11 NOTE — Telephone Encounter (Signed)
Copied from [place] [phone]. Topic: Quick Communication - See Telephone Encounter >> Aug 11, 2017  1:10 PM Hewitt Shorts wrote: CRM for notification. 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

## 2017-08-11 NOTE — Telephone Encounter (Signed)
Called [name], LVM with office fax number to send over the orders needed to be signed.

## 2017-08-12 DIAGNOSIS — L988 Other specified disorders of the skin and subcutaneous tissue: Secondary | ICD-10-CM | POA: Diagnosis not present

## 2017-08-12 DIAGNOSIS — E43 Unspecified severe protein-calorie malnutrition: Secondary | ICD-10-CM | POA: Diagnosis not present

## 2017-08-12 DIAGNOSIS — J439 Emphysema, unspecified: Secondary | ICD-10-CM | POA: Diagnosis not present

## 2017-08-12 DIAGNOSIS — I110 Hypertensive heart disease with heart failure: Secondary | ICD-10-CM | POA: Diagnosis not present

## 2017-08-12 DIAGNOSIS — C880 Waldenstrom macroglobulinemia: Secondary | ICD-10-CM | POA: Diagnosis not present

## 2017-08-12 DIAGNOSIS — I481 Persistent atrial fibrillation: Secondary | ICD-10-CM | POA: Diagnosis not present

## 2017-08-12 DIAGNOSIS — I951 Orthostatic hypotension: Secondary | ICD-10-CM | POA: Diagnosis not present

## 2017-08-12 DIAGNOSIS — M5030 Other cervical disc degeneration, unspecified cervical region: Secondary | ICD-10-CM | POA: Diagnosis not present

## 2017-08-12 DIAGNOSIS — C9190 Lymphoid leukemia, unspecified not having achieved remission: Secondary | ICD-10-CM | POA: Diagnosis not present

## 2017-08-12 DIAGNOSIS — I5042 Chronic combined systolic (congestive) and diastolic (congestive) heart failure: Secondary | ICD-10-CM | POA: Diagnosis not present

## 2017-08-12 DIAGNOSIS — I739 Peripheral vascular disease, unspecified: Secondary | ICD-10-CM | POA: Diagnosis not present

## 2017-08-13 ENCOUNTER — Telehealth (HOSPITAL_COMMUNITY): Payer: Self-pay

## 2017-08-13 DIAGNOSIS — I739 Peripheral vascular disease, unspecified: Secondary | ICD-10-CM | POA: Diagnosis not present

## 2017-08-13 NOTE — Telephone Encounter (Signed)
Zack with [facility] param[facility] program calling from patient's home, reporting no acute EKG changes from previous EKG done 04/2017.  States patient is well-appearing with no acute complaints and HR in 60-70s. Nothing further needed at this time, will awati STAT bmet from [employer]  Renee Pain, RN

## 2017-08-13 NOTE — Telephone Encounter (Signed)
Patients wife called stating patient has had intermittent, brief periods where HR increases to 110's with decreased sats.  Nothing aggravates or alleviates this.  Patient does become more weak and short of breath with this and requires increased supplemental O2.  Patient currently 72 HR with sats 94% as reported by wife on home monitor, chronic afib. Discussed with Oda Kilts PA-C and VO received to obtain STAT BMET (faxed to [facility] and called to verify order with office) and Zack with paramedicine program to go to patient's home as he is unable to leave the home (wife feels unsafe to assist patient on her own) to obtain EKG and call back to report findings. Advised wife of plan and to report to ED for further worsening s/s.  Renee Pain, RN

## 2017-08-14 DIAGNOSIS — L988 Other specified disorders of the skin and subcutaneous tissue: Secondary | ICD-10-CM | POA: Diagnosis not present

## 2017-08-14 DIAGNOSIS — I481 Persistent atrial fibrillation: Secondary | ICD-10-CM | POA: Diagnosis not present

## 2017-08-14 DIAGNOSIS — I739 Peripheral vascular disease, unspecified: Secondary | ICD-10-CM | POA: Diagnosis not present

## 2017-08-14 DIAGNOSIS — I951 Orthostatic hypotension: Secondary | ICD-10-CM | POA: Diagnosis not present

## 2017-08-14 DIAGNOSIS — I5042 Chronic combined systolic (congestive) and diastolic (congestive) heart failure: Secondary | ICD-10-CM | POA: Diagnosis not present

## 2017-08-14 DIAGNOSIS — E43 Unspecified severe protein-calorie malnutrition: Secondary | ICD-10-CM | POA: Diagnosis not present

## 2017-08-14 DIAGNOSIS — J439 Emphysema, unspecified: Secondary | ICD-10-CM | POA: Diagnosis not present

## 2017-08-14 DIAGNOSIS — I110 Hypertensive heart disease with heart failure: Secondary | ICD-10-CM | POA: Diagnosis not present

## 2017-08-14 DIAGNOSIS — M5030 Other cervical disc degeneration, unspecified cervical region: Secondary | ICD-10-CM | POA: Diagnosis not present

## 2017-08-14 DIAGNOSIS — C9190 Lymphoid leukemia, unspecified not having achieved remission: Secondary | ICD-10-CM | POA: Diagnosis not present

## 2017-08-14 DIAGNOSIS — C880 Waldenstrom macroglobulinemia: Secondary | ICD-10-CM | POA: Diagnosis not present

## 2017-08-14 NOTE — Progress Notes (Signed)
HEMATOLOGY/ONCOLOGY CLINIC NOTE   Date of Service: 08/19/17  Patient Care Team: Biagio Borg, MD as PCP - General (Internal Medicine) [name], MD as PCP - Cardiology (Cardiology) Donita Brooks, MD as Attending Physician (Internal Medicine) Mack Guise. 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  Patient follows with Dr Alen Blew for his h/o prostate cancer and was found to have borderline lymphadenopathy dating back to January 2018. He had a mediastinal adenopathy that was borderline detected on a CT scan. A repeat CT scan on 05/17/2016 showed persistent enlarged mediastinal lymph nodes and a left internal mammary lymph node. Differential considerations include lymphoproliferative disorder, granulomatous inflammation/infection or reactive adenopathy. left cervical LN Bx in 10/2016 was unrevealing.  Patient has multiple co-morbidities including PAF on Xarelto status post DCCV 2/0/25, diastolic CHF with LVEF 42-70% on echo 05/14/16, COPD with pulmonary hypertension on home O2 at night time s/p right VATS 05/20/16 for empyema. He was admitted with  Worsening fatigue and generalized swelling.  He was noted to have hypoalbuminemia and further w/u demonstrated nephrotic range proteinuria of about 6g per day. Patient's nephrology w/u showed monoclonal paraproteinemia with M spike of 1.1g/dl. He subsequently had a kidney biopsy on 02/27/2017 - which per report --prelim showed AL Amyoidosis. Final pathology pending.  Patient is being diuresed aggresivel.  ECHO 10/13 showed EF of 62-37% Diastolic function could not be evaluated due to Atrial  fibrillation.  I discussed the available results in details with the patient and his wife at bedside.  He has no focal bone pain, Has some anemia. No overt hypercalcemia at this time.   INTERVAL HISTORY:    [name]  is here for follow up of his malignant LPL and Renal AL Amyloidosis lambda light chain prior to his 6th and last cycle of BR. He presents to the clinic in a wheelchair with oxygen canula. He is accompanied today by his wife. He has been taking oral potassium BID. Given his K is now normal he can reduce to once daily.  On review of symptoms, pt he notes his edema in his legs have gone down, he no longer freezes at night and his arms are no longer "weeping" anymore. He notes he can eat well but will have incontinence so his appetite is not that high. Wife notes rash on his groin area and would like something to help it. He notes is has increased frequency in urination. Pt notes skin sensitivity upon touch which can be back related.     MEDICAL HISTORY:      Past Medical History:  Diagnosis Date  . Anxiety   . Arthritis    "neck" (08/15/2015)  . Chronic bronchitis ([place])   . Chronic diastolic CHF (congestive heart failure) ([place])   . Constipation   . COPD (chronic obstructive pulmonary disease) ([place])   . DDD (degenerative disc disease), cervical   . Depression   . Dilated aortic root ([place])    51m on echo 05/2016  . Edema of left lower extremity   . Facial basal cell cancer   . H/O acne vulgaris 1960s   [facility]  . Headache    history of - left temporal- years ago- not current (08/15/2015)  . Persistent atrial fibrillation (HCC)    on coumadin with CHADS2VASC score of 2  . Pneumonia 1960s; 2015 X 2  . Prostate cancer ([place]) dx'd early 2000s   "low spreading; non aggressive type" (08/15/2015)  . Pulmonary HTN ([place])    PASP 28mHg by echo 05/2016  . Skin cancer    "back"   SURGICAL HISTORY:       Past Surgical History:    Procedure Laterality Date  . CARDIOVERSION N/A 10/11/2016   Procedure: CARDIOVERSION; Surgeon: CSanda Klein MD; [facility]; Service: Cardiovascular; Laterality: N/A;  . CARDIOVERSION N/A 02/26/2017   Procedure: CARDIOVERSION; Surgeon: MLarey Dresser MD; [facility]; Service: Cardiovascular; Laterality: N/A;  . COLONOSCOPY    . EXCISIONAL HEMORRHOIDECTOMY  1960s  . INGUINAL HERNIA REPAIR Right 2002  . INGUINAL HERNIA REPAIR Bilateral 08/15/2015  . INGUINAL HERNIA REPAIR Bilateral 08/15/2015   Procedure: OPEN REPAIR RECURRENT RIGHT INGUINAL HERNIA WITH MESH AND REPAIR LEFT INGUINAL HERNIA WITH MESH; Surgeon: HFanny Skates MD; [facility] Service: General; Laterality: Bilateral;  . INSERTION OF MESH Bilateral 08/15/2015   Procedure: INSERTION OF MESH; Surgeon: HFanny Skates MD; Location: [facility] Service: General; Laterality: Bilateral;  . IR THORACENTESIS ASP PLEURAL SPACE W/IMG GUIDE  11/18/2016  . LYMPH NODE BIOPSY Bilateral 10/22/2016   Procedure: LEFT NECK LYMPH NODE BIOPSY; Surgeon: VIvin Poot MD; [place] [facility] Service: Thoracic; Laterality: Bilateral;  . PROSTATE BIOPSY    . TONSILLECTOMY  1950s  . VIDEO ASSISTED THORACOSCOPY (VATS)/EMPYEMA Right 05/20/2016   Procedure: VIDEO ASSISTED THORACOSCOPY (VATS) with drainage of pleural effusion; Surgeon: PIvin Poot MD; [place] [facility] Service: Thoracic; Laterality: Right;  .Marland KitchenVIDEO BRONCHOSCOPY WITH ENDOBRONCHIAL ULTRASOUND Right 05/20/2016   Procedure: VIDEO BRONCHOSCOPY WITH ENDOBRONCHIAL ULTRASOUND; Surgeon: PIvin Poot MD; [place] [facility] Service: Thoracic; Laterality: Right;   SOCIAL HISTORY:  Social History        Social History  . Marital status: Married    Spouse name: N/A  . Number of children: N/A  . Years of education: N/A      Occupational History  . Not on file.         Social History Main Topics  . Smoking status: Former Smoker    Packs/day: 1.00    Years: 62.00     Types: Cigarettes    Quit date: 05/14/2016  . Smokeless tobacco: Never Used  . Alcohol use No  . Drug use: No     Comment: CBD OIL  . Sexual activity: Not Currently    Partners: Female       Other Topics Concern  . Not on file      Social History Narrative  . No narrative on file   FAMILY HISTORY:       Family History  Problem Relation Age of Onset  . Colon cancer Mother   . Ovarian cancer Mother   . Alcoholism Father   . CAD Father   . Alcohol abuse Father   . Ovarian cancer Sister   . Diabetes Neg Hx   . Stomach cancer Neg Hx    ALLERGIES: is allergic to demerol [meperidine] and oxycodone hcl.  MEDICATIONS:   . Current Outpatient Medications:  .  acyclovir (ZOVIRAX) 400 MG tablet, Take 1 tablet (400 mg total) by mouth daily., Disp: 30 tablet, Rfl: 3 .  dexamethasone (DECADRON) 4 MG tablet,  Take 2 tablets (8 mg total) daily by mouth. Start the day after bendamustine chemotherapy for 2 days. Take with food., Disp: 30 tablet, Rfl: 1 .  docusate sodium (COLACE) 100 MG capsule, Take 1-3 capsules (100-300 mg total) by mouth daily., Disp: 90 capsule, Rfl: 5 .  feeding supplement (BOOST / RESOURCE BREEZE) LIQD, Take 1 Container by mouth 3 (three) times daily between meals., Disp: 5 Container, Rfl: 0 .  furosemide (LASIX) 40 MG tablet, Take 1 tablet (40 mg total) by mouth daily., Disp: 30 tablet, Rfl: 3 .  HYDROcodone-acetaminophen (NORCO) 10-325 MG tablet, Take 1 tablet by mouth every 6 (six) hours as needed., Disp: 120 tablet, Rfl: 0 .  hydrocortisone cream 1 %, Apply 1 application topically 4 (four) times daily as needed for itching. (Patient not taking: Reported on 07/22/2017), Disp: 30 g, Rfl: 0 .  hydrOXYzine (ATARAX/VISTARIL) 10 MG tablet, Take 6 tablets (60 mg total) by mouth 3 (three) times daily as needed., Disp: 60 tablet, Rfl: 0 .  midodrine (PROAMATINE) 10 MG tablet, Take 1 tablet (10 mg total) by mouth 3 (three) times daily with meals., Disp: 90 tablet, Rfl: 3 .   nystatin cream (MYCOSTATIN), APPLY EXTERNALLY TO THE AFFECTED AREA TWICE DAILY, Disp: 30 g, Rfl: 0 .  ondansetron (ZOFRAN) 8 MG tablet, Take 1 tablet (8 mg total) 2 (two) times daily as needed by mouth for refractory nausea / vomiting. Start on day 2 after bendamustine chemo. (Patient not taking: Reported on 07/22/2017), Disp: 30 tablet, Rfl: 1 .  polyethylene glycol (MIRALAX / GLYCOLAX) packet, Take 17 g by mouth daily as needed., Disp: 14 each, Rfl: 0 .  potassium chloride SA (K-DUR,KLOR-CON) 20 MEQ tablet, Take 1 tablet (20 mEq total) by mouth 2 (two) times daily., Disp: 60 tablet, Rfl: 0 .  prochlorperazine (COMPAZINE) 10 MG tablet, Take 1 tablet (10 mg total) every 6 (six) hours as needed by mouth (Nausea or vomiting)., Disp: 30 tablet, Rfl: 1 .  rivaroxaban (XARELTO) 20 MG TABS tablet, Take 1 tablet (20 mg total) by mouth daily with supper., Disp: 30 tablet, Rfl: 6 .  senna-docusate (SENNA S) 8.6-50 MG tablet, Take 2 tablets by mouth 2 (two) times daily. Reduce to 2 tab po HS once constipation resolved, Disp: 60 tablet, Rfl: 1 .  traZODone (DESYREL) 50 MG tablet, Take 0.5-1 tablets (25-50 mg total) by mouth at bedtime as needed for sleep., Disp: 90 tablet, Rfl: 1  REVIEW OF SYSTEMS:   .10 Point review of Systems was done is negative except as noted above.   PHYSICAL EXAMINATION:  ECOG PERFORMANCE STATUS: 2 - Symptomatic, <50% confined to bed  .BP 99/65 (BP Location: Right Arm, Patient Position: Sitting)   Pulse 64   Temp 97.9 F (36.6 C) (Oral)   Resp 18   Ht _0  (1.702 m)   Wt 163 lb 11.2 oz (74.3 kg)   SpO2 97%   BMI 25.64 kg/m   . GENERAL:alert, in no acute distress and comfortable, somewhat frail appearing SKIN: no acute rashes, no significant lesions EYES: conjunctiva are pink and non-injected, sclera anicteric OROPHARYNX: MMM, no exudates, no oropharyngeal erythema or ulceration NECK: supple, no JVD LYMPH:  no palpable lymphadenopathy in the cervical, axillary or  inguinal regions LUNGS: clear to auscultation b/l with normal respiratory effort HEART: regular rate & rhythm ABDOMEN:  normoactive bowel sounds , non tender, not distended. Extremity: generalized anasarca -- improved from last visit. PSYCH: alert & oriented x 3 with fluent speech NEURO: no  focal motor/sensory deficits   LABORATORY DATA:  I have reviewed the data as listed   . CBC Latest Ref Rng & Units 08/19/2017 07/22/2017 06/20/2017  WBC 4.0 - 10.3 K/uL 6.3 5.9 5.0  Hemoglobin 13.0 - 17.1 g/dL 9.9(L) 11.0(L) 11.0(L)  Hematocrit 38.4 - 49.9 % 31.1(L) 33.7(L) 33.1(L)  Platelets 140 - 400 K/uL 360 300 302   . CMP Latest Ref Rng & Units 08/19/2017 07/22/2017 06/20/2017  Glucose 70 - 140 mg/dL 99 109 84  BUN 7 - 26 mg/dL 27(H) 21 30(H)  Creatinine 0.70 - 1.30 mg/dL 0.87 0.81 1.05  Sodium 136 - 145 mmol/L 137 136 140  Potassium 3.5 - 5.1 mmol/L 4.9 2.9(LL) 3.9  Chloride 98 - 109 mmol/L 103 98 106  CO2 22 - 29 mmol/L 29 31(H) 23  Calcium 8.4 - 10.4 mg/dL 8.1(L) 8.1(L) 8.4(L)  Total Protein 6.4 - 8.3 g/dL 4.5(L) 4.4(L) -  Total Bilirubin 0.2 - 1.2 mg/dL 0.3 0.4 -  Alkaline Phos 40 - 150 U/L 239(H) 233(H) -  AST 5 - 34 U/L 13 15 -  ALT 0 - 55 U/L 8 10 -     Component     Latest Ref Rng & Units 03/01/2017 05/22/2017 06/12/2017 07/22/2017  IgG (Immunoglobin G), Serum     700 - 1,600 mg/dL 476 (L) 348 (L) 338 (L) 240 (L)  IgA     61 - 437 mg/dL 72 61 49 (L) 35 (L)  IgM (Immunoglobulin M), Srm     15 - 143 mg/dL 1,757 (H) 1,103 (H) 933 (H) 614 (H)  Total Protein ELP     6.0 - 8.5 g/dL 4.9 (L) 4.6 (L) 4.8 (L) 4.0 (L)  Albumin SerPl Elph-Mcnc     2.9 - 4.4 g/dL 1.6 (L) 1.9 (L) 2.1 (L) 1.8 (L)  Alpha 1     0.0 - 0.4 g/dL 0.3 0.3 0.3 0.2  Alpha2 Glob SerPl Elph-Mcnc     0.4 - 1.0 g/dL 0.8 0.7 0.8 0.7  B-Globulin SerPl Elph-Mcnc     0.7 - 1.3 g/dL 0.7 0.7 0.8 0.7  Gamma Glob SerPl Elph-Mcnc     0.4 - 1.8 g/dL 1.5 1.0 0.9 0.6  M Protein SerPl Elph-Mcnc     Not Observed g/dL 1.2 (H) 0.7  (H) 0.6 (H) 0.4 (H)  Globulin, Total     2.2 - 3.9 g/dL 3.3 2.7 2.7 2.2  Albumin/Glob SerPl     0.7 - 1.7 0.5 (L) 0.8 0.8 0.9  IFE 1      Comment Comment Comment Comment  Please Note (HCV):      Comment Comment Comment Comment    Component     Latest Ref Rng & Units 08/19/2017  IgG (Immunoglobin G), Serum     700 - 1,600 mg/dL 214 (L)  IgA     61 - 437 mg/dL 30 (L)  IgM (Immunoglobulin M), Srm     15 - 143 mg/dL 481 (H)  Total Protein ELP     6.0 - 8.5 g/dL 4.0 (L)  Albumin SerPl Elph-Mcnc     2.9 - 4.4 g/dL 1.8 (L)  Alpha 1     0.0 - 0.4 g/dL 0.3  Alpha2 Glob SerPl Elph-Mcnc     0.4 - 1.0 g/dL 0.7  B-Globulin SerPl Elph-Mcnc     0.7 - 1.3 g/dL 0.7  Gamma Glob SerPl Elph-Mcnc     0.4 - 1.8 g/dL 0.5  M Protein SerPl Elph-Mcnc     Not Observed  g/dL 0.3 (H)  Globulin, Total     2.2 - 3.9 g/dL 2.2  Albumin/Glob SerPl     0.7 - 1.7 0.9  IFE 1      Comment  Please Note (HCV):      Comment    Component     Latest Ref Rng & Units 03/01/2017 05/22/2017 06/12/2017 07/22/2017  Kappa free light chain     3.3 - 19.4 mg/L 168.2 (H) 117.9 (H) 117.9 (H) 75.6 (H)  Lamda free light chains     5.7 - 26.3 mg/L 134.6 (H) 43.0 (H) 27.1 (H) 14.9  Kappa, lamda light chain ratio     0.26 - 1.65 1.25 2.74 (H) 4.35 (H) 5.07 (H)   Component     Latest Ref Rng & Units 08/19/2017  Kappa free light chain     3.3 - 19.4 mg/L 69.0 (H)  Lamda free light chains     5.7 - 26.3 mg/L 15.5  Kappa, lamda light chain ratio     0.26 - 1.65 4.45 (H)                 *[place]*  *[facility]*  [address], [place] [address]  [phone]  -------------------------------------------------------------------  Transthoracic Echocardiography  Patient: Santhosh, Gulino  MR #: [id number]  Study Date: 02/22/2017  Gender: M  Age: 61  Height: 180.3 cm  Weight: 73 kg  BSA: 1.91 m^2  Pt. Status:  Room: [place], [place], [place]  Gillis Santa [id number]  Esau Grew [id number]  PERFORMING [facility], Inpatient  SONOGRAPHER Jannett Celestine, RDCS  cc:  -------------------------------------------------------------------  LV EF: 60% - 65%  -------------------------------------------------------------------  Indications: CHF - 428.0.  -------------------------------------------------------------------  History: PMH: Pulmonary Hypertension  Atrial fibrillation. Congestive heart failure. Chronic  obstructive pulmonary disease. Risk factors: Former tobacco use.  -------------------------------------------------------------------  Study Conclusions  - Left ventricle: The cavity size was normal. There was moderate  concentric hypertrophy. Systolic function was normal. The  estimated ejection fraction was in the range of 60% to 65%. Wall  motion was normal; there were no regional wall motion  abnormalities. The study was not technically sufficient to allow  evaluation of LV diastolic dysfunction due to atrial  fibrillation.  - Aortic valve: Trileaflet; normal thickness, mildly calcified  leaflets. There was trivial regurgitation.  - Mitral valve: Calcified annulus. There was mild regurgitation.  - Left atrium: The atrium was mildly dilated.  - Right ventricle: The cavity size was moderately dilated. Wall  thickness was normal.  - Right atrium: The atrium was massively dilated.  - Tricuspid valve: There was moderate regurgitation.  - Pulmonic valve: There was trivial regurgitation.  - Pulmonary arteries: PA peak pressure: 51 mm Hg (S).  RADIOGRAPHIC STUDIES:  I have personally reviewed the radiological images as listed and agreed with the findings in the report.   Imaging Results    Whole body skeletal survey 03/02/2017  IMPRESSION: 1. No aggressive lytic or sclerotic bone lesion of the axial and appendicular skeleton.   Electronically Signed   By: Kathreen Devoid   On: 03/02/2017 12:47    ASSESSMENT & PLAN:   72 y.o.  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  -no prohibitive toxicity from C5 of BR treatment.  Good partial response after 5 cycles of BR based on improvement in M spike from 1.2 to 0.3 and improvement in   PLAN  -Labs reviewed with pt and his wife. Potassium improved to 4.9. He can now reduce oral potassium to once daily. Cr, total protein, and albumin stable. -He will do a 24 hour urine within the next month.  -appropriate to proceed with his 6th and last cycle BR at this time. He continues to have partial response to treatment.  -He has some stool incontinence, especially after he eats. I suggested he try to schedule his bathroom trips to regulate his BMs better. If needed I may suggest Calcium polycarbophil.  -I advised him to increase protein in his diet and increase physical activity. He can increase ensure boosts.  -Pt notes new rash on his groin, Will give antifungal powder prescription or to help and keep area dry as he is siting in a chair for long periods of time.  -Following current regimen, I again discussed the role of maintenance Rituxan every 2-3 months or target therapy ibrutinib w or w/o Rituxan or just surveillance.  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 24h UPEP with 3,361 Proteinuria daily. (this represents an improvement from >6g per 24h of proteinuria)   3) Chronic Low BP -- on midodrine. ? Autonomic dysfunction. Low albumin levels. PLAN  -will need close f/u with PCP/nephrology/cardiology for continue mx of diuretics in  the setting of soft BP and needing the use of midodrine. -He is currently on Lasix 25m daily, except on days of chemotherapy.  -BP improved to 99/65 today   3.) Back pain  -Defer pain management and further evaluation to PCP. -MR thoracic spine was ordered by Dr. JCathlean Coweron 03/26/17 and was negative for fracture or metastatic disease -F/u with pcp for pain management of chronic back pain - could consider orthopedic evaluation if needed.     4.) SEVERE protein calorie malnutrition  -has been referred to nutritional therapy for this. -06/16/17 24hr urine shows improvement with urine protein down to 3,361. -He has tried edible gummy bears to increase his appetite which has helped him eat sufficiently. He is fine to continue as needed. This helps his mood and overall pain as well.  -total protein is stable.   Labs in 5 weeks  RTC with Dr KIrene Limboin 6 weeks  All of the patients questions were answered with apparent satisfaction. The patient knows to call the clinic with any problems, questions or concerns.   . The total time spent in the appointment was 25 minutes and more than 50% was on counseling and direct patient cares.   GSullivan LoneMD MBlue MoundAAHIVMS SBapt[facility] Hematology/Oncology Physician  [facility] (Office): [phone] (Work cell): [phone] (Fax): [phone]   This document serves as a record of services personally performed by GSullivan Lone MD. It was created on his behalf by AJoslyn Devon a trained medical scribe. The creation of this record is based on the scribe's personal observations and the provider's statements to them.   .I have reviewed the above documentation for accuracy and completeness, and I agree with the above. .Brunetta GeneraMD MS

## 2017-08-18 DIAGNOSIS — E43 Unspecified severe protein-calorie malnutrition: Secondary | ICD-10-CM | POA: Diagnosis not present

## 2017-08-18 DIAGNOSIS — C880 Waldenstrom macroglobulinemia: Secondary | ICD-10-CM | POA: Diagnosis not present

## 2017-08-18 DIAGNOSIS — M5030 Other cervical disc degeneration, unspecified cervical region: Secondary | ICD-10-CM | POA: Diagnosis not present

## 2017-08-18 DIAGNOSIS — I951 Orthostatic hypotension: Secondary | ICD-10-CM | POA: Diagnosis not present

## 2017-08-18 DIAGNOSIS — J439 Emphysema, unspecified: Secondary | ICD-10-CM | POA: Diagnosis not present

## 2017-08-18 DIAGNOSIS — I5042 Chronic combined systolic (congestive) and diastolic (congestive) heart failure: Secondary | ICD-10-CM | POA: Diagnosis not present

## 2017-08-18 DIAGNOSIS — C9190 Lymphoid leukemia, unspecified not having achieved remission: Secondary | ICD-10-CM | POA: Diagnosis not present

## 2017-08-18 DIAGNOSIS — I739 Peripheral vascular disease, unspecified: Secondary | ICD-10-CM | POA: Diagnosis not present

## 2017-08-18 DIAGNOSIS — I110 Hypertensive heart disease with heart failure: Secondary | ICD-10-CM | POA: Diagnosis not present

## 2017-08-18 DIAGNOSIS — I481 Persistent atrial fibrillation: Secondary | ICD-10-CM | POA: Diagnosis not present

## 2017-08-18 DIAGNOSIS — L988 Other specified disorders of the skin and subcutaneous tissue: Secondary | ICD-10-CM | POA: Diagnosis not present

## 2017-08-19 ENCOUNTER — Inpatient Hospital Stay (HOSPITAL_BASED_OUTPATIENT_CLINIC_OR_DEPARTMENT_OTHER): Payer: Medicare Other | Admitting: Hematology

## 2017-08-19 ENCOUNTER — Inpatient Hospital Stay: Payer: Medicare Other | Attending: Hematology

## 2017-08-19 ENCOUNTER — Inpatient Hospital Stay: Payer: Medicare Other

## 2017-08-19 ENCOUNTER — Telehealth: Payer: Self-pay | Admitting: Internal Medicine

## 2017-08-19 VITALS — BP 99/65 | HR 64 | Temp 97.9°F | Resp 18 | Ht 67.0 in | Wt 163.7 lb

## 2017-08-19 VITALS — BP 98/64 | HR 80 | Temp 98.4°F | Resp 16

## 2017-08-19 DIAGNOSIS — Z95828 Presence of other vascular implants and grafts: Secondary | ICD-10-CM

## 2017-08-19 DIAGNOSIS — E43 Unspecified severe protein-calorie malnutrition: Secondary | ICD-10-CM

## 2017-08-19 DIAGNOSIS — E8581 Light chain (AL) amyloidosis: Secondary | ICD-10-CM | POA: Diagnosis not present

## 2017-08-19 DIAGNOSIS — C880 Waldenstrom macroglobulinemia: Secondary | ICD-10-CM | POA: Insufficient documentation

## 2017-08-19 DIAGNOSIS — I739 Peripheral vascular disease, unspecified: Secondary | ICD-10-CM | POA: Diagnosis not present

## 2017-08-19 DIAGNOSIS — I481 Persistent atrial fibrillation: Secondary | ICD-10-CM | POA: Diagnosis not present

## 2017-08-19 DIAGNOSIS — C8300 Small cell B-cell lymphoma, unspecified site: Secondary | ICD-10-CM

## 2017-08-19 DIAGNOSIS — I110 Hypertensive heart disease with heart failure: Secondary | ICD-10-CM | POA: Diagnosis not present

## 2017-08-19 DIAGNOSIS — M5030 Other cervical disc degeneration, unspecified cervical region: Secondary | ICD-10-CM | POA: Diagnosis not present

## 2017-08-19 DIAGNOSIS — N049 Nephrotic syndrome with unspecified morphologic changes: Secondary | ICD-10-CM | POA: Diagnosis not present

## 2017-08-19 DIAGNOSIS — Z5111 Encounter for antineoplastic chemotherapy: Secondary | ICD-10-CM | POA: Diagnosis not present

## 2017-08-19 DIAGNOSIS — I959 Hypotension, unspecified: Secondary | ICD-10-CM | POA: Diagnosis not present

## 2017-08-19 DIAGNOSIS — I951 Orthostatic hypotension: Secondary | ICD-10-CM | POA: Diagnosis not present

## 2017-08-19 DIAGNOSIS — L988 Other specified disorders of the skin and subcutaneous tissue: Secondary | ICD-10-CM | POA: Diagnosis not present

## 2017-08-19 DIAGNOSIS — Z7189 Other specified counseling: Secondary | ICD-10-CM

## 2017-08-19 DIAGNOSIS — Z5112 Encounter for antineoplastic immunotherapy: Secondary | ICD-10-CM | POA: Insufficient documentation

## 2017-08-19 DIAGNOSIS — M549 Dorsalgia, unspecified: Secondary | ICD-10-CM

## 2017-08-19 DIAGNOSIS — D472 Monoclonal gammopathy: Secondary | ICD-10-CM

## 2017-08-19 DIAGNOSIS — J439 Emphysema, unspecified: Secondary | ICD-10-CM | POA: Diagnosis not present

## 2017-08-19 DIAGNOSIS — C9190 Lymphoid leukemia, unspecified not having achieved remission: Secondary | ICD-10-CM | POA: Diagnosis not present

## 2017-08-19 DIAGNOSIS — I5042 Chronic combined systolic (congestive) and diastolic (congestive) heart failure: Secondary | ICD-10-CM | POA: Diagnosis not present

## 2017-08-19 LAB — CMP (CANCER CENTER ONLY)
ALT: 8 U/L (ref 0–55)
AST: 13 U/L (ref 5–34)
Albumin: 1.7 g/dL — ABNORMAL LOW (ref 3.5–5.0)
Alkaline Phosphatase: 239 U/L — ABNORMAL HIGH (ref 40–150)
Anion gap: 5 (ref 3–11)
BUN: 27 mg/dL — ABNORMAL HIGH (ref 7–26)
CO2: 29 mmol/L (ref 22–29)
Calcium: 8.1 mg/dL — ABNORMAL LOW (ref 8.4–10.4)
Chloride: 103 mmol/L (ref 98–109)
Creatinine: 0.87 mg/dL (ref 0.70–1.30)
GFR, Est AFR Am: >60 mL/min (ref >60)
GFR, Estimated: >60 mL/min (ref >60)
Glucose, Bld: 99 mg/dL (ref 70–140)
Potassium: 4.9 mmol/L (ref 3.5–5.1)
Sodium: 137 mmol/L (ref 136–145)
Total Bilirubin: 0.3 mg/dL (ref 0.2–1.2)
Total Protein: 4.5 g/dL — ABNORMAL LOW (ref 6.4–8.3)

## 2017-08-19 LAB — CBC WITH DIFFERENTIAL (CANCER CENTER ONLY)
BASOS ABS: 0 10*3/uL (ref 0.0–0.1)
Basophils Relative: 1 %
Eosinophils Absolute: 0.1 10*3/uL (ref 0.0–0.5)
Eosinophils Relative: 2 %
HEMATOCRIT: 31.1 % — AB (ref 38.4–49.9)
Hemoglobin: 9.9 g/dL — ABNORMAL LOW (ref 13.0–17.1)
LYMPHS PCT: 5 %
Lymphs Abs: 0.3 10*3/uL — ABNORMAL LOW (ref 0.9–3.3)
MCH: 33.3 pg (ref 27.2–33.4)
MCHC: 31.8 g/dL — ABNORMAL LOW (ref 32.0–36.0)
MCV: 104.7 fL — AB (ref 79.3–98.0)
Monocytes Absolute: 0.3 10*3/uL (ref 0.1–0.9)
Monocytes Relative: 4 %
Neutro Abs: 5.6 10*3/uL (ref 1.5–6.5)
Neutrophils Relative %: 88 %
Platelet Count: 360 10*3/uL (ref 140–400)
RBC: 2.97 MIL/uL — AB (ref 4.20–5.82)
RDW: 15.7 % — ABNORMAL HIGH (ref 11.0–14.6)
WBC: 6.3 10*3/uL (ref 4.0–10.3)

## 2017-08-19 LAB — RETICULOCYTES
RBC.: 2.97 MIL/uL — ABNORMAL LOW (ref 4.20–5.82)
RETIC COUNT ABSOLUTE: 50.5 10*3/uL (ref 34.8–93.9)
Retic Ct Pct: 1.7 % (ref 0.8–1.8)

## 2017-08-19 LAB — LACTATE DEHYDROGENASE: LDH: 186 U/L (ref 125–245)

## 2017-08-19 MED ORDER — DIPHENHYDRAMINE HCL 25 MG PO CAPS
50.0000 mg | ORAL_CAPSULE | Freq: Once | ORAL | Status: AC
  Administered 2017-08-19: 50 mg via ORAL

## 2017-08-19 MED ORDER — HEPARIN SOD (PORK) LOCK FLUSH 100 UNIT/ML IV SOLN
500.0000 [IU] | Freq: Once | INTRAVENOUS | Status: AC | PRN
  Administered 2017-08-19: 500 [IU]
  Filled 2017-08-19: qty 5

## 2017-08-19 MED ORDER — ACETAMINOPHEN 325 MG PO TABS
650.0000 mg | ORAL_TABLET | Freq: Once | ORAL | Status: AC
  Administered 2017-08-19: 650 mg via ORAL

## 2017-08-19 MED ORDER — PALONOSETRON HCL INJECTION 0.25 MG/5ML
INTRAVENOUS | Status: AC
  Filled 2017-08-19: qty 5

## 2017-08-19 MED ORDER — ACETAMINOPHEN 325 MG PO TABS
ORAL_TABLET | ORAL | Status: AC
  Filled 2017-08-19: qty 2

## 2017-08-19 MED ORDER — SODIUM CHLORIDE 0.9% FLUSH
10.0000 mL | INTRAVENOUS | Status: DC | PRN
  Administered 2017-08-19: 10 mL
  Filled 2017-08-19: qty 10

## 2017-08-19 MED ORDER — SODIUM CHLORIDE 0.9 % IV SOLN
Freq: Once | INTRAVENOUS | Status: AC
  Administered 2017-08-19: 14:00:00 via INTRAVENOUS

## 2017-08-19 MED ORDER — DIPHENHYDRAMINE HCL 25 MG PO CAPS
ORAL_CAPSULE | ORAL | Status: AC
  Filled 2017-08-19: qty 2

## 2017-08-19 MED ORDER — DEXAMETHASONE SODIUM PHOSPHATE 10 MG/ML IJ SOLN
10.0000 mg | Freq: Once | INTRAMUSCULAR | Status: AC
  Administered 2017-08-19: 10 mg via INTRAVENOUS

## 2017-08-19 MED ORDER — PALONOSETRON HCL INJECTION 0.25 MG/5ML
0.2500 mg | Freq: Once | INTRAVENOUS | Status: AC
  Administered 2017-08-19: 0.25 mg via INTRAVENOUS

## 2017-08-19 MED ORDER — DEXAMETHASONE SODIUM PHOSPHATE 10 MG/ML IJ SOLN
INTRAMUSCULAR | Status: AC
  Filled 2017-08-19: qty 1

## 2017-08-19 MED ORDER — SODIUM CHLORIDE 0.9% FLUSH
10.0000 mL | Freq: Once | INTRAVENOUS | Status: AC
  Administered 2017-08-19: 10 mL
  Filled 2017-08-19: qty 10

## 2017-08-19 MED ORDER — SODIUM CHLORIDE 0.9 % IV SOLN
375.0000 mg/m2 | Freq: Once | INTRAVENOUS | Status: AC
  Administered 2017-08-19: 700 mg via INTRAVENOUS
  Filled 2017-08-19: qty 50

## 2017-08-19 MED ORDER — SODIUM CHLORIDE 0.9 % IV SOLN
70.0000 mg/m2 | Freq: Once | INTRAVENOUS | Status: AC
  Administered 2017-08-19: 125 mg via INTRAVENOUS
  Filled 2017-08-19: qty 5

## 2017-08-19 NOTE — Telephone Encounter (Signed)
Called pt, left detailed msg and asked pt to call me back.

## 2017-08-19 NOTE — Patient Instructions (Signed)
[facility] Discharge Instructions for Patients Receiving Chemotherapy  Today you received the following chemotherapy agents:  Rituxan and Bendeka.  To help prevent nausea and vomiting after your treatment, we encourage you to take your nausea medication as directed.   If you develop nausea and vomiting that is not controlled by your nausea medication, call the clinic.   BELOW ARE SYMPTOMS THAT SHOULD BE REPORTED IMMEDIATELY:  *FEVER GREATER THAN 100.5 F  *CHILLS WITH OR WITHOUT FEVER  NAUSEA AND VOMITING THAT IS NOT CONTROLLED WITH YOUR NAUSEA MEDICATION  *UNUSUAL SHORTNESS OF BREATH  *UNUSUAL BRUISING OR BLEEDING  TENDERNESS IN MOUTH AND THROAT WITH OR WITHOUT PRESENCE OF ULCERS  *URINARY PROBLEMS  *BOWEL PROBLEMS  UNUSUAL RASH Items with * indicate a potential emergency and should be followed up as soon as possible.  Feel free to call the clinic should you have any questions or concerns. The clinic phone number is [phone].  Please show the CHEMO ALERT CARD at check-in to the Emergency Department and triage nurse.   

## 2017-08-19 NOTE — Telephone Encounter (Signed)
Copied from [place] [phone]. Topic: Inquiry >> Aug 18, 2017  5:48 PM Tye [place] wrote: Reason for CRM: [facility] called to report low bp 71/55, pt may have dehydration contact [facility] if needed @ [phone]

## 2017-08-19 NOTE — Telephone Encounter (Signed)
Please advise 

## 2017-08-19 NOTE — Telephone Encounter (Signed)
Needs to go to ED now, please

## 2017-08-20 ENCOUNTER — Telehealth: Payer: Self-pay | Admitting: Hematology

## 2017-08-20 ENCOUNTER — Inpatient Hospital Stay: Payer: Medicare Other

## 2017-08-20 VITALS — BP 98/70 | HR 62 | Temp 98.0°F | Resp 18

## 2017-08-20 DIAGNOSIS — E8581 Light chain (AL) amyloidosis: Secondary | ICD-10-CM | POA: Diagnosis not present

## 2017-08-20 DIAGNOSIS — Z5111 Encounter for antineoplastic chemotherapy: Secondary | ICD-10-CM | POA: Diagnosis not present

## 2017-08-20 DIAGNOSIS — C880 Waldenstrom macroglobulinemia: Secondary | ICD-10-CM | POA: Diagnosis not present

## 2017-08-20 DIAGNOSIS — Z5112 Encounter for antineoplastic immunotherapy: Secondary | ICD-10-CM | POA: Diagnosis not present

## 2017-08-20 DIAGNOSIS — Z7189 Other specified counseling: Secondary | ICD-10-CM

## 2017-08-20 DIAGNOSIS — C8300 Small cell B-cell lymphoma, unspecified site: Secondary | ICD-10-CM

## 2017-08-20 LAB — KAPPA/LAMBDA LIGHT CHAINS
KAPPA FREE LGHT CHN: 69 mg/L — AB (ref 3.3–19.4)
Kappa, lambda light chain ratio: 4.45 — ABNORMAL HIGH (ref 0.26–1.65)
Lambda free light chains: 15.5 mg/L (ref 5.7–26.3)

## 2017-08-20 MED ORDER — SODIUM CHLORIDE 0.9% FLUSH
10.0000 mL | INTRAVENOUS | Status: DC | PRN
  Administered 2017-08-20: 10 mL
  Filled 2017-08-20: qty 10

## 2017-08-20 MED ORDER — PEGFILGRASTIM 6 MG/0.6ML ~~LOC~~ PSKT
PREFILLED_SYRINGE | SUBCUTANEOUS | Status: AC
  Filled 2017-08-20: qty 0.6

## 2017-08-20 MED ORDER — LOPERAMIDE HCL 2 MG PO CAPS
ORAL_CAPSULE | ORAL | Status: AC
  Filled 2017-08-20: qty 2

## 2017-08-20 MED ORDER — DEXAMETHASONE SODIUM PHOSPHATE 10 MG/ML IJ SOLN
INTRAMUSCULAR | Status: AC
  Filled 2017-08-20: qty 1

## 2017-08-20 MED ORDER — SODIUM CHLORIDE 0.9 % IV SOLN
Freq: Once | INTRAVENOUS | Status: AC
  Administered 2017-08-20: 13:00:00 via INTRAVENOUS

## 2017-08-20 MED ORDER — HEPARIN SOD (PORK) LOCK FLUSH 100 UNIT/ML IV SOLN
500.0000 [IU] | Freq: Once | INTRAVENOUS | Status: AC | PRN
  Administered 2017-08-20: 500 [IU]
  Filled 2017-08-20: qty 5

## 2017-08-20 MED ORDER — DEXAMETHASONE SODIUM PHOSPHATE 10 MG/ML IJ SOLN
10.0000 mg | Freq: Once | INTRAMUSCULAR | Status: AC
  Administered 2017-08-20: 10 mg via INTRAVENOUS

## 2017-08-20 MED ORDER — LOPERAMIDE HCL 2 MG PO CAPS
2.0000 mg | ORAL_CAPSULE | Freq: Once | ORAL | Status: AC
  Administered 2017-08-20: 2 mg via ORAL

## 2017-08-20 MED ORDER — SODIUM CHLORIDE 0.9 % IV SOLN
70.0000 mg/m2 | Freq: Once | INTRAVENOUS | Status: AC
  Administered 2017-08-20: 125 mg via INTRAVENOUS
  Filled 2017-08-20: qty 5

## 2017-08-20 MED ORDER — PEGFILGRASTIM 6 MG/0.6ML ~~LOC~~ PSKT
6.0000 mg | PREFILLED_SYRINGE | Freq: Once | SUBCUTANEOUS | Status: AC
  Administered 2017-08-20: 6 mg via SUBCUTANEOUS

## 2017-08-20 NOTE — Patient Instructions (Signed)
[place] Discharge Instructions for Patients Receiving Chemotherapy  Today you received the following chemotherapy agents:  Rituxan and Bendeka.  To help prevent nausea and vomiting after your treatment, we encourage you to take your nausea medication as directed.   If you develop nausea and vomiting that is not controlled by your nausea medication, call the clinic.   BELOW ARE SYMPTOMS THAT SHOULD BE REPORTED IMMEDIATELY:  *FEVER GREATER THAN 100.5 F  *CHILLS WITH OR WITHOUT FEVER  NAUSEA AND VOMITING THAT IS NOT CONTROLLED WITH YOUR NAUSEA MEDICATION  *UNUSUAL SHORTNESS OF BREATH  *UNUSUAL BRUISING OR BLEEDING  TENDERNESS IN MOUTH AND THROAT WITH OR WITHOUT PRESENCE OF ULCERS  *URINARY PROBLEMS  *BOWEL PROBLEMS  UNUSUAL RASH Items with * indicate a potential emergency and should be followed up as soon as possible.  Feel free to call the clinic should you have any questions or concerns. The clinic phone number is [phone].  Please show the [place] at check-in to the Emergency Department and triage nurse.

## 2017-08-20 NOTE — Telephone Encounter (Signed)
Appts already scheduled per 4/9 los - gave patient calender .

## 2017-08-21 ENCOUNTER — Other Ambulatory Visit: Payer: Self-pay | Admitting: Hematology

## 2017-08-21 LAB — MULTIPLE MYELOMA PANEL, SERUM
ALBUMIN SERPL ELPH-MCNC: 1.8 g/dL — AB (ref 2.9–4.4)
Albumin/Glob SerPl: 0.9 (ref 0.7–1.7)
Alpha 1: 0.3 g/dL (ref 0.0–0.4)
Alpha2 Glob SerPl Elph-Mcnc: 0.7 g/dL (ref 0.4–1.0)
B-GLOBULIN SERPL ELPH-MCNC: 0.7 g/dL (ref 0.7–1.3)
Gamma Glob SerPl Elph-Mcnc: 0.5 g/dL (ref 0.4–1.8)
Globulin, Total: 2.2 g/dL (ref 2.2–3.9)
IgA: 30 mg/dL — ABNORMAL LOW (ref 61–437)
IgG (Immunoglobin G), Serum: 214 mg/dL — ABNORMAL LOW (ref 700–1600)
IgM (Immunoglobulin M), Srm: 481 mg/dL — ABNORMAL HIGH (ref 15–143)
M Protein SerPl Elph-Mcnc: 0.3 g/dL — ABNORMAL HIGH
TOTAL PROTEIN ELP: 4 g/dL — AB (ref 6.0–8.5)

## 2017-08-21 MED ORDER — CALCIUM POLYCARBOPHIL 625 MG PO TABS: 625.0000 mg | ORAL_TABLET | Freq: Every day | ORAL | 0 refills | Status: DC

## 2017-08-21 MED ORDER — CLOTRIMAZOLE POWD: 5.0000 g | Freq: Two times a day (BID) | 1 refills | Status: DC

## 2017-08-24 ENCOUNTER — Telehealth: Payer: Self-pay | Admitting: Physician Assistant

## 2017-08-24 NOTE — Telephone Encounter (Signed)
Patient losted 5 lbs after chemo, no diarrhea, nausea or vomiting. Currently on 79m daily lasix  (not BID as noted by Epic), was 160 lbs yesterday, now 155 lbs. Advised to hold lasix today and if weight does not come up, call tomorrow before give lasix. Continue on midodrine  SHilbert CorriganPA Pager: [phone]

## 2017-08-25 DIAGNOSIS — I739 Peripheral vascular disease, unspecified: Secondary | ICD-10-CM | POA: Diagnosis not present

## 2017-08-25 DIAGNOSIS — J439 Emphysema, unspecified: Secondary | ICD-10-CM | POA: Diagnosis not present

## 2017-08-25 DIAGNOSIS — C9190 Lymphoid leukemia, unspecified not having achieved remission: Secondary | ICD-10-CM | POA: Diagnosis not present

## 2017-08-25 DIAGNOSIS — L988 Other specified disorders of the skin and subcutaneous tissue: Secondary | ICD-10-CM | POA: Diagnosis not present

## 2017-08-25 DIAGNOSIS — I5042 Chronic combined systolic (congestive) and diastolic (congestive) heart failure: Secondary | ICD-10-CM | POA: Diagnosis not present

## 2017-08-25 DIAGNOSIS — M5030 Other cervical disc degeneration, unspecified cervical region: Secondary | ICD-10-CM | POA: Diagnosis not present

## 2017-08-25 DIAGNOSIS — C880 Waldenstrom macroglobulinemia: Secondary | ICD-10-CM | POA: Diagnosis not present

## 2017-08-25 DIAGNOSIS — I110 Hypertensive heart disease with heart failure: Secondary | ICD-10-CM | POA: Diagnosis not present

## 2017-08-25 DIAGNOSIS — I951 Orthostatic hypotension: Secondary | ICD-10-CM | POA: Diagnosis not present

## 2017-08-25 DIAGNOSIS — E43 Unspecified severe protein-calorie malnutrition: Secondary | ICD-10-CM | POA: Diagnosis not present

## 2017-08-25 DIAGNOSIS — I481 Persistent atrial fibrillation: Secondary | ICD-10-CM | POA: Diagnosis not present

## 2017-08-25 DIAGNOSIS — J449 Chronic obstructive pulmonary disease, unspecified: Secondary | ICD-10-CM | POA: Diagnosis not present

## 2017-08-26 ENCOUNTER — Telehealth: Payer: Self-pay | Admitting: Internal Medicine

## 2017-08-26 NOTE — Telephone Encounter (Signed)
Copied from [place] [id number]. Topic: Inquiry >> Aug 26, 2017  2:14 PM Scherrie Gerlach wrote: Reason for CRM: wife calling to advise [facility] does not have any future orders for pt to have aid to come into the home to give pt a bath. Wife states pt has not had a bath in a week, because they have not come to the home. Pt was not told they were not coming, and everyday they waited.  Wife finally called today and was given the information about not having any orders. Also requesting aid for bath to come in 2 times a week   [facility] [phone]

## 2017-08-26 NOTE — Telephone Encounter (Signed)
Unfortunately the rules say that unless pt has been seen in last 90 days, we cannot order [facility] with RN and aide as wife requests;  Pt was last seen May 14, 2017  We are forced by the rules to ask pt to make ROV

## 2017-08-27 ENCOUNTER — Telehealth: Payer: Self-pay | Admitting: Internal Medicine

## 2017-08-27 DIAGNOSIS — E43 Unspecified severe protein-calorie malnutrition: Secondary | ICD-10-CM | POA: Diagnosis not present

## 2017-08-27 DIAGNOSIS — I5042 Chronic combined systolic (congestive) and diastolic (congestive) heart failure: Secondary | ICD-10-CM | POA: Diagnosis not present

## 2017-08-27 DIAGNOSIS — I951 Orthostatic hypotension: Secondary | ICD-10-CM | POA: Diagnosis not present

## 2017-08-27 DIAGNOSIS — M5030 Other cervical disc degeneration, unspecified cervical region: Secondary | ICD-10-CM | POA: Diagnosis not present

## 2017-08-27 DIAGNOSIS — C880 Waldenstrom macroglobulinemia: Secondary | ICD-10-CM | POA: Diagnosis not present

## 2017-08-27 DIAGNOSIS — C9190 Lymphoid leukemia, unspecified not having achieved remission: Secondary | ICD-10-CM | POA: Diagnosis not present

## 2017-08-27 DIAGNOSIS — I739 Peripheral vascular disease, unspecified: Secondary | ICD-10-CM | POA: Diagnosis not present

## 2017-08-27 DIAGNOSIS — I481 Persistent atrial fibrillation: Secondary | ICD-10-CM | POA: Diagnosis not present

## 2017-08-27 DIAGNOSIS — I110 Hypertensive heart disease with heart failure: Secondary | ICD-10-CM | POA: Diagnosis not present

## 2017-08-27 DIAGNOSIS — L988 Other specified disorders of the skin and subcutaneous tissue: Secondary | ICD-10-CM | POA: Diagnosis not present

## 2017-08-27 DIAGNOSIS — J439 Emphysema, unspecified: Secondary | ICD-10-CM | POA: Diagnosis not present

## 2017-08-27 NOTE — Telephone Encounter (Signed)
Copied from [place]. Topic: Inquiry >> Aug 27, 2017 11:27 AM Lennox Solders wrote: Reason for CRM: dana from [facility] is calling and needs verbal orders for lymphedema pumps for patient his legs

## 2017-08-27 NOTE — Telephone Encounter (Signed)
Ok for verbals 

## 2017-08-27 NOTE — Telephone Encounter (Signed)
Called pt wife no answer left MD response below.Marland KitchenJohny [name]

## 2017-08-28 NOTE — Telephone Encounter (Signed)
Hinton Dyer given verbal orders

## 2017-08-29 ENCOUNTER — Other Ambulatory Visit: Payer: Self-pay | Admitting: *Deleted

## 2017-08-29 DIAGNOSIS — I110 Hypertensive heart disease with heart failure: Secondary | ICD-10-CM | POA: Diagnosis not present

## 2017-08-29 DIAGNOSIS — M5030 Other cervical disc degeneration, unspecified cervical region: Secondary | ICD-10-CM | POA: Diagnosis not present

## 2017-08-29 DIAGNOSIS — E43 Unspecified severe protein-calorie malnutrition: Secondary | ICD-10-CM | POA: Diagnosis not present

## 2017-08-29 DIAGNOSIS — J439 Emphysema, unspecified: Secondary | ICD-10-CM | POA: Diagnosis not present

## 2017-08-29 DIAGNOSIS — L988 Other specified disorders of the skin and subcutaneous tissue: Secondary | ICD-10-CM | POA: Diagnosis not present

## 2017-08-29 DIAGNOSIS — I5042 Chronic combined systolic (congestive) and diastolic (congestive) heart failure: Secondary | ICD-10-CM | POA: Diagnosis not present

## 2017-08-29 DIAGNOSIS — C880 Waldenstrom macroglobulinemia: Secondary | ICD-10-CM | POA: Diagnosis not present

## 2017-08-29 DIAGNOSIS — I951 Orthostatic hypotension: Secondary | ICD-10-CM | POA: Diagnosis not present

## 2017-08-29 DIAGNOSIS — I739 Peripheral vascular disease, unspecified: Secondary | ICD-10-CM | POA: Diagnosis not present

## 2017-08-29 DIAGNOSIS — C9190 Lymphoid leukemia, unspecified not having achieved remission: Secondary | ICD-10-CM | POA: Diagnosis not present

## 2017-08-29 DIAGNOSIS — I481 Persistent atrial fibrillation: Secondary | ICD-10-CM | POA: Diagnosis not present

## 2017-08-29 MED ORDER — MICONAZOLE POWD: 5.0000 g | Freq: Two times a day (BID) | 1 refills | Status: DC

## 2017-09-01 ENCOUNTER — Ambulatory Visit (INDEPENDENT_AMBULATORY_CARE_PROVIDER_SITE_OTHER): Payer: Medicare Other | Admitting: Internal Medicine

## 2017-09-01 ENCOUNTER — Encounter: Payer: Self-pay | Admitting: Internal Medicine

## 2017-09-01 VITALS — BP 92/64 | HR 61 | Ht 67.0 in | Wt 154.0 lb

## 2017-09-01 DIAGNOSIS — I5033 Acute on chronic diastolic (congestive) heart failure: Secondary | ICD-10-CM

## 2017-09-01 DIAGNOSIS — G8929 Other chronic pain: Secondary | ICD-10-CM | POA: Diagnosis not present

## 2017-09-01 DIAGNOSIS — J449 Chronic obstructive pulmonary disease, unspecified: Secondary | ICD-10-CM | POA: Diagnosis not present

## 2017-09-01 DIAGNOSIS — I481 Persistent atrial fibrillation: Secondary | ICD-10-CM | POA: Diagnosis not present

## 2017-09-01 DIAGNOSIS — C8300 Small cell B-cell lymphoma, unspecified site: Secondary | ICD-10-CM | POA: Diagnosis not present

## 2017-09-01 DIAGNOSIS — R52 Pain, unspecified: Secondary | ICD-10-CM | POA: Diagnosis not present

## 2017-09-01 DIAGNOSIS — C9190 Lymphoid leukemia, unspecified not having achieved remission: Secondary | ICD-10-CM | POA: Diagnosis not present

## 2017-09-01 DIAGNOSIS — E43 Unspecified severe protein-calorie malnutrition: Secondary | ICD-10-CM | POA: Diagnosis not present

## 2017-09-01 DIAGNOSIS — I739 Peripheral vascular disease, unspecified: Secondary | ICD-10-CM | POA: Diagnosis not present

## 2017-09-01 DIAGNOSIS — L988 Other specified disorders of the skin and subcutaneous tissue: Secondary | ICD-10-CM | POA: Diagnosis not present

## 2017-09-01 DIAGNOSIS — M5030 Other cervical disc degeneration, unspecified cervical region: Secondary | ICD-10-CM | POA: Diagnosis not present

## 2017-09-01 DIAGNOSIS — J439 Emphysema, unspecified: Secondary | ICD-10-CM | POA: Diagnosis not present

## 2017-09-01 DIAGNOSIS — C880 Waldenstrom macroglobulinemia: Secondary | ICD-10-CM | POA: Diagnosis not present

## 2017-09-01 DIAGNOSIS — I5042 Chronic combined systolic (congestive) and diastolic (congestive) heart failure: Secondary | ICD-10-CM | POA: Diagnosis not present

## 2017-09-01 DIAGNOSIS — I110 Hypertensive heart disease with heart failure: Secondary | ICD-10-CM | POA: Diagnosis not present

## 2017-09-01 DIAGNOSIS — I951 Orthostatic hypotension: Secondary | ICD-10-CM | POA: Diagnosis not present

## 2017-09-01 MED ORDER — LIDOCAINE 5 % EX PTCH: 1.0000 | MEDICATED_PATCH | CUTANEOUS | 0 refills | Status: DC

## 2017-09-01 MED ORDER — LIDOCAINE 5 % EX PTCH: 1.0000 | MEDICATED_PATCH | CUTANEOUS | 2 refills | Status: DC

## 2017-09-01 NOTE — Patient Instructions (Signed)
Please take all new medication as prescribed - the lidoderm patches  Please call in 1 week if not better, for pain management referral  Please continue all other medications as before, and refills have been done if requested.  Please have the pharmacy call with any other refills you may need.  Please continue your efforts at being more active, low cholesterol diet, and weight control.  You are otherwise up to date with prevention measures today.  Please keep your appointments with your specialists as you may have planned  Please return in 6 months, or sooner if needed

## 2017-09-01 NOTE — Assessment & Plan Note (Signed)
Volume stable, cont same tx

## 2017-09-01 NOTE — Assessment & Plan Note (Addendum)
Primarily low back, for add lidoderm patch, consider pain management referral  Note:  Total time for pt hx, exam, review of record with pt in the room, determination of diagnoses and plan for further eval and tx is > 40 min, with over 50% spent in coordination and counseling of patient including the differential dx, tx, further evaluation and other management of chronic pain, lymphoma, back pain, general weakness, copd and chf

## 2017-09-01 NOTE — Assessment & Plan Note (Signed)
stable overall by history and exam, and pt to continue medical treatment as before,  to f/u any worsening symptoms or concerns

## 2017-09-01 NOTE — Assessment & Plan Note (Signed)
[place] for aide with [facility] and RN, f/u urology as planned

## 2017-09-01 NOTE — Progress Notes (Signed)
Subjective:    Patient ID: [name], male    DOB: 23-Apr-1946, 72 y.o.   MRN: [id number]  HPI  Here for wellness and f/u;  Overall doing ok;  Pt denies Chest pain, worsening SOB, DOE, wheezing, orthopnea, PND, worsening LE edema, palpitations, dizziness or syncope.  Pt denies neurological change such as new headache, facial or extremity weakness.  Pt denies polydipsia, polyuria, or low sugar symptoms. Pt states overall good compliance with treatment and medications, good tolerability, and has been trying to follow appropriate diet.  Pt denies worsening depressive symptoms, suicidal ideation or panic. No fever, night sweats, wt loss, loss of appetite, or other constitutional symptoms.  Pt states good ability with ADL's, has mod fall risk - walks with walker at home but only walks few steps from room to room, home safety reviewed and adequate, no other significant changes in hearing or vision, and only occasionally active with exercise. Finished CMT last wk, has f/u oncology soon.  Pt continues to have recurring LBP without change in severity, bowel or bladder change, fever, wt loss,  worsening LE pain/numbness/weakness.  Current pain control not working too well with hydrocodone 10;s, though CBD gummy bears help pain and appetite.  Now with leg wraps and improve LE swelling per HH.   Pt denies fever, wt loss, night sweats, loss of appetite, or other constitutional symptoms  No other new complaints  Needs HH restarted particularly the Aide for bathing.   If did not have so much back pain, he would o/w be much more functional. In wheelchair today.  Declines hospice referral today Past Medical History:  Diagnosis Date  . Anxiety   . Arthritis    "neck" (08/15/2015)  . Chronic bronchitis ([place])   . Chronic diastolic CHF (congestive heart failure) ([place])   . Constipation   . COPD (chronic obstructive pulmonary disease) ([place])   . DDD (degenerative disc disease), cervical   . Depression   . Dilated  aortic root ([place])    38m on echo 05/2016  . Edema of left lower extremity   . Facial basal cell cancer   . H/O acne vulgaris 1960s   [facility]  . Headache    history of - left temporal- years ago- not current (08/15/2015)  . Persistent atrial fibrillation (HCC)    on coumadin with CHADS2VASC score of 2  . Pneumonia 1960s; 2015 X 2  . Prostate cancer ([place] dx[place] early 2000[place]   "low spreading; non aggressive type" (08/15/2015)  . Pulmonary HTN ([place]    PASP 410mg by echo 05/2016  . Skin cancer    "back"   Past Surgical History:  Procedure Laterality Date  . CARDIOVERSION N/A 10/11/2016   Procedure: CARDIOVERSION;  Surgeon: CrSanda KleinMD;  [facility];  Service: Cardiovascular;  Laterality: N/A;  . CARDIOVERSION N/A 02/26/2017   Procedure: CARDIOVERSION;  Surgeon: McLarey DresserMD;  [facility];  Service: Cardiovascular;  Laterality: N/A;  . COLONOSCOPY    . EXCISIONAL HEMORRHOIDECTOMY  1960s  . INGUINAL HERNIA REPAIR Right 2002  . INGUINAL HERNIA REPAIR Bilateral 08/15/2015  . INGUINAL HERNIA REPAIR Bilateral 08/15/2015   Procedure: OPEN REPAIR RECURRENT RIGHT INGUINAL HERNIA WITH MESH AND REPAIR LEFT INGUINAL HERNIA WITH MESH;  Surgeon: HaFanny SkatesMD;  [facility] Service: General;  Laterality: Bilateral;  . INSERTION OF MESH Bilateral 08/15/2015   Procedure: INSERTION OF MESH;  Surgeon: HaFanny SkatesMD;  Location: [facility];  Service: General;  Laterality: Bilateral;  . IR FLUORO GUIDE PORT INSERTION RIGHT  06/20/2017  . IR THORACENTESIS ASP PLEURAL SPACE W/IMG GUIDE  11/18/2016  . IR US GUIDE VASC ACCESS RIGHT  06/20/2017  . LYMPH NODE BIOPSY Bilateral 10/22/2016   Procedure: LEFT NECK LYMPH NODE BIOPSY;  Surgeon: [name], MD;  Location: [place];  Service: Thoracic;  Laterality: Bilateral;  . PROSTATE BIOPSY    . TONSILLECTOMY  1950s  . VIDEO ASSISTED THORACOSCOPY (VATS)/EMPYEMA Right 05/20/2016   Procedure:  VIDEO ASSISTED THORACOSCOPY (VATS) with drainage of pleural effusion;  Surgeon: [name], MD;  Location: [place];  Service: Thoracic;  Laterality: Right;  [name] VIDEO BRONCHOSCOPY WITH ENDOBRONCHIAL ULTRASOUND Right 05/20/2016   Procedure: VIDEO BRONCHOSCOPY WITH ENDOBRONCHIAL ULTRASOUND;  Surgeon: [name], MD;  Location: [place];  Service: Thoracic;  Laterality: Right;    reports that he quit smoking about 15 months ago. His smoking use included cigarettes. He has a 62.00 pack-year smoking history. He has never used smokeless tobacco. He reports that he does not drink alcohol or use drugs. family history includes Alcohol abuse in his father; Alcoholism in his father; CAD in his father; Colon cancer in his mother; Ovarian cancer in his mother and sister. Allergies  Allergen Reactions  . Demerol [Meperidine] Other (See Comments)    UNSPECIFIED REACTION  Causes system to shutdown. ?   . Oxycodone Hcl Other (See Comments)    Pt states this medication 'wires him up' and makes pt hyper; pt does not want to take this ever again   Current Outpatient Medications on File Prior to Visit  Medication Sig Dispense Refill  . acyclovir (ZOVIRAX) 400 MG tablet Take 1 tablet (400 mg total) by mouth daily. 30 tablet 3  . dexamethasone (DECADRON) 4 MG tablet Take 2 tablets (8 mg total) daily by mouth. Start the day after bendamustine chemotherapy for 2 days. Take with food. 30 tablet 1  . docusate sodium (COLACE) 100 MG capsule Take 1-3 capsules (100-300 mg total) by mouth daily. 90 capsule 5  . feeding supplement (BOOST / RESOURCE BREEZE) LIQD Take 1 Container by mouth 3 (three) times daily between meals. 5 Container 0  . furosemide (LASIX) 40 MG tablet Take 1 tablet (40 mg total) by mouth daily. 30 tablet 3  . HYDROcodone-acetaminophen (NORCO) 10-325 MG tablet Take 1 tablet by mouth every 6 (six) hours as needed. 120 tablet 0  . hydrocortisone cream 1 % Apply 1 application topically 4 (four) times daily as  needed for itching. 30 g 0  . hydrOXYzine (ATARAX/VISTARIL) 10 MG tablet Take 6 tablets (60 mg total) by mouth 3 (three) times daily as needed. 60 tablet 0  . Miconazole POWD Apply 5 g topically 2 (two) times daily. To rash in groin. 100 g 1  . midodrine (PROAMATINE) 10 MG tablet Take 1 tablet (10 mg total) by mouth 3 (three) times daily with meals. 90 tablet 3  . nystatin cream (MYCOSTATIN) APPLY EXTERNALLY TO THE AFFECTED AREA TWICE DAILY 30 g 0  . ondansetron (ZOFRAN) 8 MG tablet Take 1 tablet (8 mg total) 2 (two) times daily as needed by mouth for refractory nausea / vomiting. Start on day 2 after bendamustine chemo. 30 tablet 1  . polycarbophil (FIBERCON) 625 MG tablet Take 1 tablet (625 mg total) by mouth daily. 30 tablet 0  . polyethylene glycol (MIRALAX / GLYCOLAX) packet Take 17 g by mouth daily as needed. 14 each  0  . potassium chloride SA (K-DUR,KLOR-CON) 20 MEQ tablet Take 1 tablet (20 mEq total) by mouth 2 (two) times daily. 60 tablet 0  . prochlorperazine (COMPAZINE) 10 MG tablet Take 1 tablet (10 mg total) every 6 (six) hours as needed by mouth (Nausea or vomiting). 30 tablet 1  . rivaroxaban (XARELTO) 20 MG TABS tablet Take 1 tablet (20 mg total) by mouth daily with supper. 30 tablet 6  . senna-docusate (SENNA S) 8.6-50 MG tablet Take 2 tablets by mouth 2 (two) times daily. Reduce to 2 tab po HS once constipation resolved 60 tablet 1  . traZODone (DESYREL) 50 MG tablet Take 0.5-1 tablets (25-50 mg total) by mouth at bedtime as needed for sleep. 90 tablet 1   No current facility-administered medications on file prior to visit.    Review of Systems  Constitutional: Negative for other unusual diaphoresis or sweats HENT: Negative for ear discharge or swelling Eyes: Negative for other worsening visual disturbances Respiratory: Negative for stridor or other swelling  Gastrointestinal: Negative for worsening distension or other blood Genitourinary: Negative for retention or other  urinary change Musculoskeletal: Negative for other MSK pain or swelling Skin: Negative for color change or other new lesions Neurological: Negative for worsening tremors and other numbness  Psychiatric/Behavioral: Negative for worsening agitation or other fatigue All other system neg per pt    Objective:   Physical Exam BP 92/64   Pulse 61   Ht 5' 7" (1.702 m)   Wt 154 lb (69.9 kg)   SpO2 99%   BMI 24.12 kg/m  VS noted, chornic ill on home o2 Constitutional: Pt appears in NAD HENT: Head: NCAT.  Right Ear: External ear normal.  Left Ear: External ear normal.  Eyes: . Pupils are equal, round, and reactive to light. Conjunctivae and EOM are normal Nose: without d/c or deformity Neck: Neck supple. Gross normal ROM Cardiovascular: Normal rate and regular rhythm.   Pulmonary/Chest: Effort normal and breath sounds without rales or wheezing.  Abd:  Soft, NT, ND, + BS, no organomegaly Neurological: Pt is alert. At baseline orientation, motor grossly intact Skin: Skin is warm. No rashes, other new lesions, no LE edema Psychiatric: Pt behavior is normal without agitation  No other exam findings  Lab Results  Component Value Date   WBC 6.3 08/19/2017   HGB 9.9 (L) 08/19/2017   HCT 31.1 (L) 08/19/2017   PLT 360 08/19/2017   GLUCOSE 99 08/19/2017   CHOL 115 02/24/2017   TRIG 51 02/24/2017   HDL 39 (L) 02/24/2017   LDLCALC 66 02/24/2017   ALT 8 08/19/2017   AST 13 08/19/2017   NA 137 08/19/2017   K 4.9 08/19/2017   CL 103 08/19/2017   CREATININE 0.87 08/19/2017   BUN 27 (H) 08/19/2017   CO2 29 08/19/2017   TSH 2.489 02/22/2017   PSA 1.7 11/01/2016   INR 1.13 06/20/2017   Declines further labs    Assessment & Plan:

## 2017-09-02 ENCOUNTER — Telehealth: Payer: Self-pay

## 2017-09-02 DIAGNOSIS — I481 Persistent atrial fibrillation: Secondary | ICD-10-CM | POA: Diagnosis not present

## 2017-09-02 DIAGNOSIS — C9190 Lymphoid leukemia, unspecified not having achieved remission: Secondary | ICD-10-CM | POA: Diagnosis not present

## 2017-09-02 DIAGNOSIS — J439 Emphysema, unspecified: Secondary | ICD-10-CM | POA: Diagnosis not present

## 2017-09-02 DIAGNOSIS — C880 Waldenstrom macroglobulinemia: Secondary | ICD-10-CM | POA: Diagnosis not present

## 2017-09-02 DIAGNOSIS — I110 Hypertensive heart disease with heart failure: Secondary | ICD-10-CM | POA: Diagnosis not present

## 2017-09-02 DIAGNOSIS — M5030 Other cervical disc degeneration, unspecified cervical region: Secondary | ICD-10-CM | POA: Diagnosis not present

## 2017-09-02 DIAGNOSIS — I951 Orthostatic hypotension: Secondary | ICD-10-CM | POA: Diagnosis not present

## 2017-09-02 DIAGNOSIS — L988 Other specified disorders of the skin and subcutaneous tissue: Secondary | ICD-10-CM | POA: Diagnosis not present

## 2017-09-02 DIAGNOSIS — E43 Unspecified severe protein-calorie malnutrition: Secondary | ICD-10-CM | POA: Diagnosis not present

## 2017-09-02 DIAGNOSIS — I5042 Chronic combined systolic (congestive) and diastolic (congestive) heart failure: Secondary | ICD-10-CM | POA: Diagnosis not present

## 2017-09-02 DIAGNOSIS — I739 Peripheral vascular disease, unspecified: Secondary | ICD-10-CM | POA: Diagnosis not present

## 2017-09-02 NOTE — Telephone Encounter (Signed)
Please advise.    Copied from [place] [id number]. Topic: General - Other >> Sep 02, 2017  1:44 PM Carolyn Stare wrote:  Pt need to replace mattress for his medical bed and need a order for [place]       Pt phone number [phone]

## 2017-09-02 NOTE — Telephone Encounter (Signed)
Done hardcopy to [place]  

## 2017-09-03 ENCOUNTER — Other Ambulatory Visit: Payer: Self-pay | Admitting: Cardiology

## 2017-09-03 ENCOUNTER — Telehealth: Payer: Self-pay | Admitting: Hematology

## 2017-09-03 DIAGNOSIS — R600 Localized edema: Secondary | ICD-10-CM

## 2017-09-03 NOTE — Telephone Encounter (Signed)
Printed office notes and labs for last 6 months per patient's spouse to be picked up.Release AJ#[id number]

## 2017-09-03 NOTE — Telephone Encounter (Signed)
Faxed

## 2017-09-04 ENCOUNTER — Other Ambulatory Visit: Payer: Self-pay | Admitting: Hematology

## 2017-09-04 DIAGNOSIS — C9190 Lymphoid leukemia, unspecified not having achieved remission: Secondary | ICD-10-CM | POA: Diagnosis not present

## 2017-09-04 DIAGNOSIS — I951 Orthostatic hypotension: Secondary | ICD-10-CM | POA: Diagnosis not present

## 2017-09-04 DIAGNOSIS — J439 Emphysema, unspecified: Secondary | ICD-10-CM | POA: Diagnosis not present

## 2017-09-04 DIAGNOSIS — I481 Persistent atrial fibrillation: Secondary | ICD-10-CM | POA: Diagnosis not present

## 2017-09-04 DIAGNOSIS — I5042 Chronic combined systolic (congestive) and diastolic (congestive) heart failure: Secondary | ICD-10-CM | POA: Diagnosis not present

## 2017-09-04 DIAGNOSIS — E43 Unspecified severe protein-calorie malnutrition: Secondary | ICD-10-CM | POA: Diagnosis not present

## 2017-09-04 DIAGNOSIS — I739 Peripheral vascular disease, unspecified: Secondary | ICD-10-CM | POA: Diagnosis not present

## 2017-09-04 DIAGNOSIS — I110 Hypertensive heart disease with heart failure: Secondary | ICD-10-CM | POA: Diagnosis not present

## 2017-09-04 DIAGNOSIS — C880 Waldenstrom macroglobulinemia: Secondary | ICD-10-CM | POA: Diagnosis not present

## 2017-09-04 DIAGNOSIS — M5030 Other cervical disc degeneration, unspecified cervical region: Secondary | ICD-10-CM | POA: Diagnosis not present

## 2017-09-04 DIAGNOSIS — L988 Other specified disorders of the skin and subcutaneous tissue: Secondary | ICD-10-CM | POA: Diagnosis not present

## 2017-09-05 ENCOUNTER — Other Ambulatory Visit: Payer: Self-pay | Admitting: Cardiology

## 2017-09-05 NOTE — Telephone Encounter (Signed)
Age 72 years Wt 69.9 kg  09/01/2017 Saw Dr Aundra Dubin 04/07/2017 08/19/2017 Hgb 9.9 HCT 31.1 08/19/2017 SrCr 0.87 CrCl 73.64  Refill done for Xarelto 20 mg daily as requested

## 2017-09-08 DIAGNOSIS — I951 Orthostatic hypotension: Secondary | ICD-10-CM | POA: Diagnosis not present

## 2017-09-08 DIAGNOSIS — E43 Unspecified severe protein-calorie malnutrition: Secondary | ICD-10-CM | POA: Diagnosis not present

## 2017-09-08 DIAGNOSIS — C880 Waldenstrom macroglobulinemia: Secondary | ICD-10-CM | POA: Diagnosis not present

## 2017-09-08 DIAGNOSIS — C9190 Lymphoid leukemia, unspecified not having achieved remission: Secondary | ICD-10-CM | POA: Diagnosis not present

## 2017-09-08 DIAGNOSIS — I110 Hypertensive heart disease with heart failure: Secondary | ICD-10-CM | POA: Diagnosis not present

## 2017-09-08 DIAGNOSIS — I739 Peripheral vascular disease, unspecified: Secondary | ICD-10-CM | POA: Diagnosis not present

## 2017-09-08 DIAGNOSIS — J439 Emphysema, unspecified: Secondary | ICD-10-CM | POA: Diagnosis not present

## 2017-09-08 DIAGNOSIS — I5042 Chronic combined systolic (congestive) and diastolic (congestive) heart failure: Secondary | ICD-10-CM | POA: Diagnosis not present

## 2017-09-08 DIAGNOSIS — I481 Persistent atrial fibrillation: Secondary | ICD-10-CM | POA: Diagnosis not present

## 2017-09-08 DIAGNOSIS — M5030 Other cervical disc degeneration, unspecified cervical region: Secondary | ICD-10-CM | POA: Diagnosis not present

## 2017-09-08 DIAGNOSIS — L988 Other specified disorders of the skin and subcutaneous tissue: Secondary | ICD-10-CM | POA: Diagnosis not present

## 2017-09-09 ENCOUNTER — Telehealth: Payer: Self-pay | Admitting: Internal Medicine

## 2017-09-09 DIAGNOSIS — M5030 Other cervical disc degeneration, unspecified cervical region: Secondary | ICD-10-CM | POA: Diagnosis not present

## 2017-09-09 DIAGNOSIS — C8300 Small cell B-cell lymphoma, unspecified site: Secondary | ICD-10-CM | POA: Diagnosis not present

## 2017-09-09 DIAGNOSIS — I481 Persistent atrial fibrillation: Secondary | ICD-10-CM | POA: Diagnosis not present

## 2017-09-09 DIAGNOSIS — E43 Unspecified severe protein-calorie malnutrition: Secondary | ICD-10-CM | POA: Diagnosis not present

## 2017-09-09 DIAGNOSIS — I5042 Chronic combined systolic (congestive) and diastolic (congestive) heart failure: Secondary | ICD-10-CM | POA: Diagnosis not present

## 2017-09-09 DIAGNOSIS — C9190 Lymphoid leukemia, unspecified not having achieved remission: Secondary | ICD-10-CM | POA: Diagnosis not present

## 2017-09-09 DIAGNOSIS — I739 Peripheral vascular disease, unspecified: Secondary | ICD-10-CM | POA: Diagnosis not present

## 2017-09-09 DIAGNOSIS — I509 Heart failure, unspecified: Secondary | ICD-10-CM

## 2017-09-09 DIAGNOSIS — I110 Hypertensive heart disease with heart failure: Secondary | ICD-10-CM | POA: Diagnosis not present

## 2017-09-09 DIAGNOSIS — L988 Other specified disorders of the skin and subcutaneous tissue: Secondary | ICD-10-CM | POA: Diagnosis not present

## 2017-09-09 DIAGNOSIS — R55 Syncope and collapse: Secondary | ICD-10-CM | POA: Diagnosis not present

## 2017-09-09 DIAGNOSIS — C880 Waldenstrom macroglobulinemia: Secondary | ICD-10-CM | POA: Diagnosis not present

## 2017-09-09 DIAGNOSIS — I951 Orthostatic hypotension: Secondary | ICD-10-CM | POA: Diagnosis not present

## 2017-09-09 DIAGNOSIS — J439 Emphysema, unspecified: Secondary | ICD-10-CM | POA: Diagnosis not present

## 2017-09-09 DIAGNOSIS — C8590 Non-Hodgkin lymphoma, unspecified, unspecified site: Secondary | ICD-10-CM

## 2017-09-09 DIAGNOSIS — I5033 Acute on chronic diastolic (congestive) heart failure: Secondary | ICD-10-CM | POA: Diagnosis not present

## 2017-09-09 NOTE — Telephone Encounter (Signed)
Copied from [place] [id number]. Topic: Quick Communication - See Telephone Encounter >> Sep 09, 2017  4:04 PM Percell Belt A wrote: CRM for notification. See Telephone encounter for: 09/09/17. Lattie Haw from [facility] home health [phone] Report a new wound, a skin tear on the back of the left wrist .  She would like treat him with xeroform with tegaderm  3 times a week.  Pt is having loose stools and would like to know if dr could call him something in for it?   Also family feels they needs a Education officer, museum to come in

## 2017-09-09 NOTE — Telephone Encounter (Signed)
[facility] for wound care as recommened  HH with RN and social services ordered  OK for Immodium or kaopectate if not yet tried for the diarrhea

## 2017-09-10 ENCOUNTER — Telehealth: Payer: Self-pay

## 2017-09-10 ENCOUNTER — Ambulatory Visit: Payer: Self-pay | Admitting: *Deleted

## 2017-09-10 NOTE — Telephone Encounter (Signed)
Orders given to [facility]

## 2017-09-10 NOTE — Telephone Encounter (Signed)
Patient has had diarrhea for 2 months every time he eats- loose stool.They have been using Imodium and Kaopectate. Patient looses stool when he stands to urinate- he also loses stool when he stands to walk. Patient is having a hard time getting protein in system due to this problem. Patient's wife is trying to figure out how to get fiber in him- and the stool stopped. Wife also wants to know if the stool needs to be tested- she also has questions about probiotics and if chemo has effected that. Home nurse is coming Friday and she can collect stool sample if it needs to be tested. Please let her know what thoughts are.  Reason for Disposition . Receiving chemotherapy or radiation therapy  Answer Assessment - Initial Assessment Questions 1. DIARRHEA SEVERITY: "How bad is the diarrhea?" "How many extra stools have you had in the past 24 hours than normal?"    - MILD (Scale 1-3): Few loose or mushy BMs; increase of 1-3 stools over normal daily number of stools; mild increase in ostomy output.   - MODERATE (Scale 4-7): Increase of 4-6 stools daily over normal; moderate increase in ostomy output.   - SEVERE (Scale 8-10 or Worst Possible): Increase of 7 or more stools daily over normal; moderate increase in ostomy output; incontinence.     Every time patient eats- rice is the only food that he can eat without occurrence. Severe 2. ONSET: "When did the diarrhea begin?"      2 months 3. BM CONSISTENCY: "How loose or watery is the diarrhea?"      very soft- mushy 4. VOMITING: "Are you also vomiting?" If so, ask: "How many times in the past 24 hours?"      no 5. ABDOMINAL PAIN: "Are you having any abdominal pain?" If yes: "What does it feel like?" (e.g., crampy, dull, intermittent, constant)      no 6. ABDOMINAL PAIN SEVERITY: If present, ask: "How bad is the pain?"  (e.g., Scale 1-10; mild, moderate, or severe)    - MILD (1-3): doesn't interfere with normal activities, abdomen soft and not tender to touch      - MODERATE (4-7): interferes with normal activities or awakens from sleep, tender to touch     - SEVERE (8-10): excruciating pain, doubled over, unable to do any normal activities       none 7. ORAL INTAKE: If vomiting, "Have you been able to drink liquids?" "How much fluids have you had in the past 24 hours?"     Patient is drinking and staying hydrated 8. HYDRATION: "Any signs of dehydration?" (e.g., dry mouth [not just dry lips], too weak to stand, dizziness, new weight loss) "When did you last urinate?"     Last chemo 2 weeks ago 9. EXPOSURE: "Have you traveled to a foreign country recently?" "Have you been exposed to anyone with diarrhea?" "Could you have eaten any food that was spoiled?"     no 10. CANCER: "What type of cancer do you have?"       Lymphoma  11. CANCER - TREATMENT: "What cancer treatments have you received?" "When did you last receive them?" (e.g., recent surgery, radiation, or chemotherapy). If triager has access to the patient's medical record, triager should review treatments and administration dates.       chemotherphy 12. CANCER - NEUTROPENIA RISK: "Have you received chemotherapy recently? If Yes, "When was it and what was your last CBC and ANC (absolute neutrophil count)?" "Were you told that your white  cell count was low?" If triager has access to the patient's medical record, triager should review most recent labs. An ANC less than 1,000 - 1,500 means that the neutrophils are low and the immune system is weak.       WBC- count good 13. OTHER SYMPTOMS: "Do you have any other symptoms?" (e.g., fever, blood in stool)       No other symptoms  Protocols used: CANCER - DIARRHEA-A-AH

## 2017-09-10 NOTE — Telephone Encounter (Signed)
Called pt to notify of need to f/u with PCP regarding potassium levels and replacement. Request received for potassium prescription, but pt should see PCP for this moving forward as he is also receiving diuretic d/t fluid retention. Pt to call back with any questions [phone].

## 2017-09-10 NOTE — Telephone Encounter (Signed)
Probably would not need stool tested if no fever, pain, blood or watery (or all together);  Ok for immodium prn

## 2017-09-11 DIAGNOSIS — E43 Unspecified severe protein-calorie malnutrition: Secondary | ICD-10-CM | POA: Diagnosis not present

## 2017-09-11 DIAGNOSIS — C9190 Lymphoid leukemia, unspecified not having achieved remission: Secondary | ICD-10-CM | POA: Diagnosis not present

## 2017-09-11 DIAGNOSIS — I481 Persistent atrial fibrillation: Secondary | ICD-10-CM | POA: Diagnosis not present

## 2017-09-11 DIAGNOSIS — J439 Emphysema, unspecified: Secondary | ICD-10-CM | POA: Diagnosis not present

## 2017-09-11 DIAGNOSIS — I951 Orthostatic hypotension: Secondary | ICD-10-CM | POA: Diagnosis not present

## 2017-09-11 DIAGNOSIS — M5030 Other cervical disc degeneration, unspecified cervical region: Secondary | ICD-10-CM | POA: Diagnosis not present

## 2017-09-11 DIAGNOSIS — L988 Other specified disorders of the skin and subcutaneous tissue: Secondary | ICD-10-CM | POA: Diagnosis not present

## 2017-09-11 DIAGNOSIS — I5042 Chronic combined systolic (congestive) and diastolic (congestive) heart failure: Secondary | ICD-10-CM | POA: Diagnosis not present

## 2017-09-11 DIAGNOSIS — I110 Hypertensive heart disease with heart failure: Secondary | ICD-10-CM | POA: Diagnosis not present

## 2017-09-11 DIAGNOSIS — C880 Waldenstrom macroglobulinemia: Secondary | ICD-10-CM | POA: Diagnosis not present

## 2017-09-11 DIAGNOSIS — I739 Peripheral vascular disease, unspecified: Secondary | ICD-10-CM | POA: Diagnosis not present

## 2017-09-11 NOTE — Telephone Encounter (Signed)
Called pt, LVM.   

## 2017-09-12 ENCOUNTER — Telehealth: Payer: Self-pay | Admitting: Internal Medicine

## 2017-09-12 DIAGNOSIS — C9190 Lymphoid leukemia, unspecified not having achieved remission: Secondary | ICD-10-CM | POA: Diagnosis not present

## 2017-09-12 DIAGNOSIS — J439 Emphysema, unspecified: Secondary | ICD-10-CM | POA: Diagnosis not present

## 2017-09-12 DIAGNOSIS — I110 Hypertensive heart disease with heart failure: Secondary | ICD-10-CM | POA: Diagnosis not present

## 2017-09-12 DIAGNOSIS — C880 Waldenstrom macroglobulinemia: Secondary | ICD-10-CM | POA: Diagnosis not present

## 2017-09-12 DIAGNOSIS — I739 Peripheral vascular disease, unspecified: Secondary | ICD-10-CM | POA: Diagnosis not present

## 2017-09-12 DIAGNOSIS — I481 Persistent atrial fibrillation: Secondary | ICD-10-CM | POA: Diagnosis not present

## 2017-09-12 DIAGNOSIS — I951 Orthostatic hypotension: Secondary | ICD-10-CM | POA: Diagnosis not present

## 2017-09-12 DIAGNOSIS — L988 Other specified disorders of the skin and subcutaneous tissue: Secondary | ICD-10-CM | POA: Diagnosis not present

## 2017-09-12 DIAGNOSIS — I5042 Chronic combined systolic (congestive) and diastolic (congestive) heart failure: Secondary | ICD-10-CM | POA: Diagnosis not present

## 2017-09-12 DIAGNOSIS — M5030 Other cervical disc degeneration, unspecified cervical region: Secondary | ICD-10-CM | POA: Diagnosis not present

## 2017-09-12 DIAGNOSIS — E43 Unspecified severe protein-calorie malnutrition: Secondary | ICD-10-CM | POA: Diagnosis not present

## 2017-09-12 NOTE — Telephone Encounter (Signed)
Ok for verbals 

## 2017-09-12 NOTE — Telephone Encounter (Signed)
Copied from [place] [id number]. Topic: Quick Communication - See Telephone Encounter >> Sep 12, 2017 12:44 PM Cleaster Corin, NT wrote: CRM for notification. See Telephone encounter for: 09/12/17. Tracey calling from [facility] to request verbal orders for OT for evaluation and treatment . Linus Orn can be reached at [phone] (can leave vm)

## 2017-09-12 NOTE — Telephone Encounter (Signed)
Called tracey back no answer LMOM w/MD response.Marland KitchenJohny Chess

## 2017-09-15 ENCOUNTER — Telehealth: Payer: Self-pay | Admitting: *Deleted

## 2017-09-15 ENCOUNTER — Other Ambulatory Visit: Payer: Self-pay | Admitting: Hematology

## 2017-09-15 DIAGNOSIS — F419 Anxiety disorder, unspecified: Secondary | ICD-10-CM

## 2017-09-15 DIAGNOSIS — M5030 Other cervical disc degeneration, unspecified cervical region: Secondary | ICD-10-CM | POA: Diagnosis not present

## 2017-09-15 DIAGNOSIS — I110 Hypertensive heart disease with heart failure: Secondary | ICD-10-CM | POA: Diagnosis not present

## 2017-09-15 DIAGNOSIS — F329 Major depressive disorder, single episode, unspecified: Secondary | ICD-10-CM | POA: Diagnosis not present

## 2017-09-15 DIAGNOSIS — Z9181 History of falling: Secondary | ICD-10-CM

## 2017-09-15 DIAGNOSIS — I272 Pulmonary hypertension, unspecified: Secondary | ICD-10-CM

## 2017-09-15 DIAGNOSIS — E43 Unspecified severe protein-calorie malnutrition: Secondary | ICD-10-CM | POA: Diagnosis not present

## 2017-09-15 DIAGNOSIS — Z9981 Dependence on supplemental oxygen: Secondary | ICD-10-CM

## 2017-09-15 DIAGNOSIS — Z8546 Personal history of malignant neoplasm of prostate: Secondary | ICD-10-CM

## 2017-09-15 DIAGNOSIS — J9611 Chronic respiratory failure with hypoxia: Secondary | ICD-10-CM

## 2017-09-15 DIAGNOSIS — I951 Orthostatic hypotension: Secondary | ICD-10-CM | POA: Diagnosis not present

## 2017-09-15 DIAGNOSIS — I481 Persistent atrial fibrillation: Secondary | ICD-10-CM | POA: Diagnosis not present

## 2017-09-15 DIAGNOSIS — I739 Peripheral vascular disease, unspecified: Secondary | ICD-10-CM | POA: Diagnosis not present

## 2017-09-15 DIAGNOSIS — C880 Waldenstrom macroglobulinemia: Secondary | ICD-10-CM | POA: Diagnosis not present

## 2017-09-15 DIAGNOSIS — L988 Other specified disorders of the skin and subcutaneous tissue: Secondary | ICD-10-CM | POA: Diagnosis not present

## 2017-09-15 DIAGNOSIS — C9190 Lymphoid leukemia, unspecified not having achieved remission: Secondary | ICD-10-CM | POA: Diagnosis not present

## 2017-09-15 DIAGNOSIS — M1990 Unspecified osteoarthritis, unspecified site: Secondary | ICD-10-CM

## 2017-09-15 DIAGNOSIS — Z7901 Long term (current) use of anticoagulants: Secondary | ICD-10-CM

## 2017-09-15 DIAGNOSIS — I5042 Chronic combined systolic (congestive) and diastolic (congestive) heart failure: Secondary | ICD-10-CM | POA: Diagnosis not present

## 2017-09-15 DIAGNOSIS — J439 Emphysema, unspecified: Secondary | ICD-10-CM | POA: Diagnosis not present

## 2017-09-15 NOTE — Telephone Encounter (Signed)
Voicemail received requesting medication advice.  ."[name] wife of [name] calling to see does he still need acyclovir.  He finished chemotherapy a few weeks ago.  Call me [phone]."   Message forwarded to collaborative for further assistance with this request.

## 2017-09-16 ENCOUNTER — Telehealth: Payer: Self-pay | Admitting: Internal Medicine

## 2017-09-16 ENCOUNTER — Telehealth: Payer: Self-pay | Admitting: *Deleted

## 2017-09-16 NOTE — Telephone Encounter (Signed)
Spoke to pt's wife, she will get a fax number that the order needs to be sent to and call back.

## 2017-09-16 NOTE — Telephone Encounter (Signed)
Copied from [place] [id number] [id number]. Topic: Quick Communication - See Telephone Encounter >> Sep 16, 2017 11:22 AM Aurelio Brash B wrote: CRM for notification. See Telephone encounter for: 09/16/17.  Olivia Mackie  from [facility] is asking for Verbal orders  for  OT and Home health Aide to see Twice a week for 60 days -    continue compression wraps to legs twice a week and   wound care to forearm (left) .  She also mentioned the pt wants to be reimbursed for Medical recliner , so need  order  for the medical recliner to [facility]   Tracy's contact is [phone]

## 2017-09-16 NOTE — Telephone Encounter (Signed)
Done harcopy to [place] for [employer]

## 2017-09-16 NOTE — Telephone Encounter (Signed)
Faxed

## 2017-09-16 NOTE — Telephone Encounter (Signed)
Verbal orders given

## 2017-09-16 NOTE — Telephone Encounter (Signed)
Pts wife calling back with the fax number. Fax#: [phone] for the PA

## 2017-09-16 NOTE — Telephone Encounter (Signed)
LVM with pt's wife [name] stating patient should continue taking acyclovir as prescribed.  Call back number provided for questions/concerns.

## 2017-09-17 ENCOUNTER — Ambulatory Visit: Payer: Self-pay | Admitting: *Deleted

## 2017-09-17 ENCOUNTER — Telehealth: Payer: Self-pay | Admitting: Internal Medicine

## 2017-09-17 DIAGNOSIS — G8929 Other chronic pain: Secondary | ICD-10-CM | POA: Diagnosis not present

## 2017-09-17 DIAGNOSIS — C880 Waldenstrom macroglobulinemia: Secondary | ICD-10-CM | POA: Diagnosis not present

## 2017-09-17 DIAGNOSIS — I481 Persistent atrial fibrillation: Secondary | ICD-10-CM | POA: Diagnosis not present

## 2017-09-17 DIAGNOSIS — I5042 Chronic combined systolic (congestive) and diastolic (congestive) heart failure: Secondary | ICD-10-CM | POA: Diagnosis not present

## 2017-09-17 DIAGNOSIS — I110 Hypertensive heart disease with heart failure: Secondary | ICD-10-CM | POA: Diagnosis not present

## 2017-09-17 DIAGNOSIS — S61512D Laceration without foreign body of left wrist, subsequent encounter: Secondary | ICD-10-CM | POA: Diagnosis not present

## 2017-09-17 DIAGNOSIS — C44509 Unspecified malignant neoplasm of skin of other part of trunk: Secondary | ICD-10-CM | POA: Diagnosis not present

## 2017-09-17 DIAGNOSIS — C8300 Small cell B-cell lymphoma, unspecified site: Secondary | ICD-10-CM | POA: Diagnosis not present

## 2017-09-17 DIAGNOSIS — E43 Unspecified severe protein-calorie malnutrition: Secondary | ICD-10-CM | POA: Diagnosis not present

## 2017-09-17 DIAGNOSIS — I951 Orthostatic hypotension: Secondary | ICD-10-CM | POA: Diagnosis not present

## 2017-09-17 DIAGNOSIS — J439 Emphysema, unspecified: Secondary | ICD-10-CM | POA: Diagnosis not present

## 2017-09-17 DIAGNOSIS — I739 Peripheral vascular disease, unspecified: Secondary | ICD-10-CM | POA: Diagnosis not present

## 2017-09-17 DIAGNOSIS — I272 Pulmonary hypertension, unspecified: Secondary | ICD-10-CM | POA: Diagnosis not present

## 2017-09-17 NOTE — Telephone Encounter (Signed)
Pt's wife reports pt has been drowsy, lethargic, "More so than usual." States he took 1/2 hydrocodone yesterday at 0730. Slept all day yesterday, still drowsy, "acting drugged." States "may be from medication but he hasn't acted this way before." Pt is "up and about, tires easily."  States eating and drinking well. Wife is requesting an order for [facility] nurse to draw labs "To see if he has any infection." Pt has port. [place] RN to see later today or tomorrow. Pt is not present during call. Wife states pt denies dizziness, is afebrile, "But rarely runs temp, even with infection." States again "Something is off."   Please advise: [phone]  Reason for Disposition . Taking a medicine that could cause weakness (e.g., blood pressure medications, diuretics)  Answer Assessment - Initial Assessment Questions 1. DESCRIPTION: "Describe how you are feeling."     Pts wife reports increased drowsiness, lethargy 2. SEVERITY: "How bad is it?"  "Can you stand and walk?"   - MILD - Feels weak or tired, but does not interfere with work, school or normal activities   - [place] to stand and walk; weakness interferes with work, school, or normal activities   - SEVERE - Unable to stand or walk     moderate 3. ONSET:  "When did the weakness begin?"     Yesterday after pain medication, still drowsy 4. CAUSE: "What do you think is causing the weakness?"     unsure 5. MEDICINES: "Have you recently started a new medicine or had a change in the amount of a medicine?"     Hydrocodone 6. OTHER SYMPTOMS: "Do you have any other symptoms?" (e.g., chest pain, fever, cough, SOB, vomiting, diarrhea, bleeding)     No  Protocols used: WEAKNESS (GENERALIZED) AND FATIGUE-A-AH

## 2017-09-17 NOTE — Telephone Encounter (Signed)
Marijean Bravo, nurse with [facility] calling to see if CBC, CMP and urine could be ordered due family concern of the pt being lethargic. On call provider Dr. Alain Marion called and conference call initiated so that orders could be given.

## 2017-09-17 NOTE — Telephone Encounter (Signed)
Ok to f/u with pt in the AM may 9, to see how he feels as the medication should be worn off by then.  O/w he may need ROV or ED visit as his symptoms are otherwise too non specific to know what to do by phone

## 2017-09-18 ENCOUNTER — Other Ambulatory Visit: Payer: Self-pay | Admitting: Hematology

## 2017-09-18 ENCOUNTER — Encounter (HOSPITAL_COMMUNITY): Payer: Self-pay | Admitting: *Deleted

## 2017-09-18 ENCOUNTER — Ambulatory Visit: Payer: Self-pay

## 2017-09-18 ENCOUNTER — Other Ambulatory Visit: Payer: Self-pay

## 2017-09-18 ENCOUNTER — Inpatient Hospital Stay (HOSPITAL_COMMUNITY)
Admission: EM | Admit: 2017-09-18 | Discharge: 2017-09-26 | DRG: 291 | Disposition: A | Discharge status: Home Health | Payer: Medicare Other | Attending: Internal Medicine | Admitting: Internal Medicine

## 2017-09-18 ENCOUNTER — Emergency Department (HOSPITAL_COMMUNITY): Payer: Medicare Other

## 2017-09-18 ENCOUNTER — Encounter: Payer: Self-pay | Admitting: Internal Medicine

## 2017-09-18 DIAGNOSIS — C8300 Small cell B-cell lymphoma, unspecified site: Secondary | ICD-10-CM | POA: Diagnosis not present

## 2017-09-18 DIAGNOSIS — J9621 Acute and chronic respiratory failure with hypoxia: Secondary | ICD-10-CM | POA: Diagnosis present

## 2017-09-18 DIAGNOSIS — F329 Major depressive disorder, single episode, unspecified: Secondary | ICD-10-CM | POA: Diagnosis present

## 2017-09-18 DIAGNOSIS — J439 Emphysema, unspecified: Secondary | ICD-10-CM | POA: Diagnosis not present

## 2017-09-18 DIAGNOSIS — M2550 Pain in unspecified joint: Secondary | ICD-10-CM | POA: Diagnosis not present

## 2017-09-18 DIAGNOSIS — E854 Organ-limited amyloidosis: Secondary | ICD-10-CM | POA: Diagnosis not present

## 2017-09-18 DIAGNOSIS — T8383XA Hemorrhage of genitourinary prosthetic devices, implants and grafts, initial encounter: Secondary | ICD-10-CM | POA: Diagnosis not present

## 2017-09-18 DIAGNOSIS — Z7901 Long term (current) use of anticoagulants: Secondary | ICD-10-CM

## 2017-09-18 DIAGNOSIS — I77810 Thoracic aortic ectasia: Secondary | ICD-10-CM | POA: Diagnosis present

## 2017-09-18 DIAGNOSIS — R0602 Shortness of breath: Secondary | ICD-10-CM | POA: Diagnosis not present

## 2017-09-18 DIAGNOSIS — Y846 Urinary catheterization as the cause of abnormal reaction of the patient, or of later complication, without mention of misadventure at the time of the procedure: Secondary | ICD-10-CM | POA: Diagnosis not present

## 2017-09-18 DIAGNOSIS — R531 Weakness: Secondary | ICD-10-CM

## 2017-09-18 DIAGNOSIS — I272 Pulmonary hypertension, unspecified: Secondary | ICD-10-CM | POA: Diagnosis present

## 2017-09-18 DIAGNOSIS — R64 Cachexia: Secondary | ICD-10-CM | POA: Diagnosis not present

## 2017-09-18 DIAGNOSIS — I340 Nonrheumatic mitral (valve) insufficiency: Secondary | ICD-10-CM | POA: Diagnosis not present

## 2017-09-18 DIAGNOSIS — Z9981 Dependence on supplemental oxygen: Secondary | ICD-10-CM

## 2017-09-18 DIAGNOSIS — Y731 Therapeutic (nonsurgical) and rehabilitative gastroenterology and urology devices associated with adverse incidents: Secondary | ICD-10-CM | POA: Diagnosis not present

## 2017-09-18 DIAGNOSIS — I5042 Chronic combined systolic (congestive) and diastolic (congestive) heart failure: Secondary | ICD-10-CM | POA: Diagnosis not present

## 2017-09-18 DIAGNOSIS — I5030 Unspecified diastolic (congestive) heart failure: Secondary | ICD-10-CM | POA: Diagnosis present

## 2017-09-18 DIAGNOSIS — R7989 Other specified abnormal findings of blood chemistry: Secondary | ICD-10-CM

## 2017-09-18 DIAGNOSIS — Z7401 Bed confinement status: Secondary | ICD-10-CM | POA: Diagnosis not present

## 2017-09-18 DIAGNOSIS — Z79899 Other long term (current) drug therapy: Secondary | ICD-10-CM

## 2017-09-18 DIAGNOSIS — S61512D Laceration without foreign body of left wrist, subsequent encounter: Secondary | ICD-10-CM | POA: Diagnosis not present

## 2017-09-18 DIAGNOSIS — C880 Waldenstrom macroglobulinemia: Secondary | ICD-10-CM | POA: Diagnosis not present

## 2017-09-18 DIAGNOSIS — G8929 Other chronic pain: Secondary | ICD-10-CM | POA: Diagnosis not present

## 2017-09-18 DIAGNOSIS — I951 Orthostatic hypotension: Secondary | ICD-10-CM | POA: Diagnosis not present

## 2017-09-18 DIAGNOSIS — Z87891 Personal history of nicotine dependence: Secondary | ICD-10-CM

## 2017-09-18 DIAGNOSIS — D539 Nutritional anemia, unspecified: Secondary | ICD-10-CM | POA: Diagnosis not present

## 2017-09-18 DIAGNOSIS — J449 Chronic obstructive pulmonary disease, unspecified: Secondary | ICD-10-CM | POA: Diagnosis present

## 2017-09-18 DIAGNOSIS — I43 Cardiomyopathy in diseases classified elsewhere: Secondary | ICD-10-CM | POA: Diagnosis present

## 2017-09-18 DIAGNOSIS — I5033 Acute on chronic diastolic (congestive) heart failure: Secondary | ICD-10-CM | POA: Diagnosis not present

## 2017-09-18 DIAGNOSIS — D472 Monoclonal gammopathy: Secondary | ICD-10-CM | POA: Diagnosis present

## 2017-09-18 DIAGNOSIS — I481 Persistent atrial fibrillation: Secondary | ICD-10-CM | POA: Diagnosis not present

## 2017-09-18 DIAGNOSIS — E43 Unspecified severe protein-calorie malnutrition: Secondary | ICD-10-CM | POA: Diagnosis present

## 2017-09-18 DIAGNOSIS — Z8041 Family history of malignant neoplasm of ovary: Secondary | ICD-10-CM

## 2017-09-18 DIAGNOSIS — E8581 Light chain (AL) amyloidosis: Secondary | ICD-10-CM | POA: Diagnosis present

## 2017-09-18 DIAGNOSIS — L251 Unspecified contact dermatitis due to drugs in contact with skin: Secondary | ICD-10-CM | POA: Diagnosis not present

## 2017-09-18 DIAGNOSIS — C44509 Unspecified malignant neoplasm of skin of other part of trunk: Secondary | ICD-10-CM | POA: Diagnosis not present

## 2017-09-18 DIAGNOSIS — D529 Folate deficiency anemia, unspecified: Secondary | ICD-10-CM | POA: Diagnosis present

## 2017-09-18 DIAGNOSIS — J918 Pleural effusion in other conditions classified elsewhere: Secondary | ICD-10-CM | POA: Diagnosis not present

## 2017-09-18 DIAGNOSIS — I482 Chronic atrial fibrillation: Secondary | ICD-10-CM | POA: Diagnosis not present

## 2017-09-18 DIAGNOSIS — G629 Polyneuropathy, unspecified: Secondary | ICD-10-CM | POA: Diagnosis present

## 2017-09-18 DIAGNOSIS — R339 Retention of urine, unspecified: Secondary | ICD-10-CM | POA: Diagnosis not present

## 2017-09-18 DIAGNOSIS — Z85828 Personal history of other malignant neoplasm of skin: Secondary | ICD-10-CM

## 2017-09-18 DIAGNOSIS — Z8546 Personal history of malignant neoplasm of prostate: Secondary | ICD-10-CM | POA: Diagnosis not present

## 2017-09-18 DIAGNOSIS — Z9889 Other specified postprocedural states: Secondary | ICD-10-CM | POA: Diagnosis not present

## 2017-09-18 DIAGNOSIS — D509 Iron deficiency anemia, unspecified: Secondary | ICD-10-CM | POA: Diagnosis present

## 2017-09-18 DIAGNOSIS — B356 Tinea cruris: Secondary | ICD-10-CM | POA: Diagnosis not present

## 2017-09-18 DIAGNOSIS — Z8249 Family history of ischemic heart disease and other diseases of the circulatory system: Secondary | ICD-10-CM

## 2017-09-18 DIAGNOSIS — N049 Nephrotic syndrome with unspecified morphologic changes: Secondary | ICD-10-CM | POA: Diagnosis not present

## 2017-09-18 DIAGNOSIS — J90 Pleural effusion, not elsewhere classified: Secondary | ICD-10-CM | POA: Diagnosis not present

## 2017-09-18 DIAGNOSIS — L89152 Pressure ulcer of sacral region, stage 2: Secondary | ICD-10-CM | POA: Diagnosis present

## 2017-09-18 DIAGNOSIS — N08 Glomerular disorders in diseases classified elsewhere: Secondary | ICD-10-CM | POA: Diagnosis present

## 2017-09-18 DIAGNOSIS — R748 Abnormal levels of other serum enzymes: Secondary | ICD-10-CM | POA: Diagnosis not present

## 2017-09-18 DIAGNOSIS — I950 Idiopathic hypotension: Secondary | ICD-10-CM | POA: Diagnosis not present

## 2017-09-18 DIAGNOSIS — R778 Other specified abnormalities of plasma proteins: Secondary | ICD-10-CM | POA: Diagnosis present

## 2017-09-18 DIAGNOSIS — M5030 Other cervical disc degeneration, unspecified cervical region: Secondary | ICD-10-CM | POA: Diagnosis present

## 2017-09-18 DIAGNOSIS — Y92230 Patient room in hospital as the place of occurrence of the external cause: Secondary | ICD-10-CM | POA: Diagnosis not present

## 2017-09-18 DIAGNOSIS — I739 Peripheral vascular disease, unspecified: Secondary | ICD-10-CM | POA: Diagnosis not present

## 2017-09-18 DIAGNOSIS — Z6822 Body mass index (BMI) 22.0-22.9, adult: Secondary | ICD-10-CM

## 2017-09-18 DIAGNOSIS — I959 Hypotension, unspecified: Secondary | ICD-10-CM | POA: Diagnosis present

## 2017-09-18 DIAGNOSIS — I509 Heart failure, unspecified: Secondary | ICD-10-CM

## 2017-09-18 DIAGNOSIS — R319 Hematuria, unspecified: Secondary | ICD-10-CM | POA: Diagnosis not present

## 2017-09-18 DIAGNOSIS — K219 Gastro-esophageal reflux disease without esophagitis: Secondary | ICD-10-CM | POA: Diagnosis not present

## 2017-09-18 DIAGNOSIS — K5900 Constipation, unspecified: Secondary | ICD-10-CM | POA: Diagnosis present

## 2017-09-18 DIAGNOSIS — I110 Hypertensive heart disease with heart failure: Secondary | ICD-10-CM | POA: Diagnosis not present

## 2017-09-18 DIAGNOSIS — J811 Chronic pulmonary edema: Secondary | ICD-10-CM | POA: Diagnosis not present

## 2017-09-18 DIAGNOSIS — Z800 Family history of malignant neoplasm of digestive organs: Secondary | ICD-10-CM

## 2017-09-18 DIAGNOSIS — R918 Other nonspecific abnormal finding of lung field: Secondary | ICD-10-CM | POA: Diagnosis not present

## 2017-09-18 DIAGNOSIS — R0600 Dyspnea, unspecified: Secondary | ICD-10-CM

## 2017-09-18 DIAGNOSIS — R404 Transient alteration of awareness: Secondary | ICD-10-CM | POA: Diagnosis not present

## 2017-09-18 DIAGNOSIS — Z09 Encounter for follow-up examination after completed treatment for conditions other than malignant neoplasm: Secondary | ICD-10-CM

## 2017-09-18 DIAGNOSIS — T413X5A Adverse effect of local anesthetics, initial encounter: Secondary | ICD-10-CM | POA: Diagnosis not present

## 2017-09-18 DIAGNOSIS — R091 Pleurisy: Secondary | ICD-10-CM | POA: Diagnosis not present

## 2017-09-18 DIAGNOSIS — Z885 Allergy status to narcotic agent status: Secondary | ICD-10-CM

## 2017-09-18 LAB — URINALYSIS, ROUTINE W REFLEX MICROSCOPIC
BACTERIA UA: NONE SEEN
Bilirubin Urine: NEGATIVE
Glucose, UA: NEGATIVE mg/dL
Ketones, ur: NEGATIVE mg/dL
Nitrite: NEGATIVE
PROTEIN: 100 mg/dL — AB
Specific Gravity, Urine: 1.011 (ref 1.005–1.030)
pH: 5 (ref 5.0–8.0)

## 2017-09-18 LAB — CBC WITH DIFFERENTIAL/PLATELET
Basophils Absolute: 0 10*3/uL (ref 0.0–0.1)
Basophils Relative: 0 %
EOS ABS: 0 10*3/uL (ref 0.0–0.7)
Eosinophils Relative: 0 %
HCT: 28.8 % — ABNORMAL LOW (ref 39.0–52.0)
HEMOGLOBIN: 9.6 g/dL — AB (ref 13.0–17.0)
LYMPHS ABS: 0.3 10*3/uL — AB (ref 0.7–4.0)
Lymphocytes Relative: 2 %
MCH: 33.7 pg (ref 26.0–34.0)
MCHC: 33.3 g/dL (ref 30.0–36.0)
MCV: 101.1 fL — ABNORMAL HIGH (ref 78.0–100.0)
MONO ABS: 0.3 10*3/uL (ref 0.1–1.0)
MONOS PCT: 3 %
Neutro Abs: 9.7 10*3/uL — ABNORMAL HIGH (ref 1.7–7.7)
Neutrophils Relative %: 95 %
Platelets: 345 10*3/uL (ref 150–400)
RBC: 2.85 MIL/uL — ABNORMAL LOW (ref 4.22–5.81)
RDW: 15.8 % — AB (ref 11.5–15.5)
WBC: 10.3 10*3/uL (ref 4.0–10.5)

## 2017-09-18 LAB — COMPREHENSIVE METABOLIC PANEL
ALBUMIN: 1.6 g/dL — AB (ref 3.5–5.0)
ALK PHOS: 273 U/L — AB (ref 38–126)
ALT: 10 U/L — ABNORMAL LOW (ref 17–63)
AST: 15 U/L (ref 15–41)
Anion gap: 10 (ref 5–15)
BILIRUBIN TOTAL: 0.6 mg/dL (ref 0.3–1.2)
BUN: 28 mg/dL — AB (ref 6–20)
CALCIUM: 7.8 mg/dL — AB (ref 8.9–10.3)
CO2: 26 mmol/L (ref 22–32)
Chloride: 100 mmol/L — ABNORMAL LOW (ref 101–111)
Creatinine, Ser: 0.77 mg/dL (ref 0.61–1.24)
GFR calc non Af Amer: >60 mL/min (ref >60)
GLUCOSE: 87 mg/dL (ref 65–99)
Potassium: 3.5 mmol/L (ref 3.5–5.1)
SODIUM: 136 mmol/L (ref 135–145)
TOTAL PROTEIN: 4.4 g/dL — AB (ref 6.5–8.1)

## 2017-09-18 LAB — PROTIME-INR
INR: 2.01
PROTHROMBIN TIME: 22.6 s — AB (ref 11.4–15.2)

## 2017-09-18 LAB — TROPONIN I
Troponin I: 0.1 ng/mL (ref <0.03)
Troponin I: 0.1 ng/mL (ref <0.03)

## 2017-09-18 LAB — I-STAT CG4 LACTIC ACID, ED: Lactic Acid, Venous: 0.69 mmol/L (ref 0.5–1.9)

## 2017-09-18 LAB — BRAIN NATRIURETIC PEPTIDE: B Natriuretic Peptide: 1924.4 pg/mL — ABNORMAL HIGH (ref 0.0–100.0)

## 2017-09-18 MED ORDER — SODIUM CHLORIDE 0.9% FLUSH
3.0000 mL | Freq: Two times a day (BID) | INTRAVENOUS | Status: DC
  Administered 2017-09-18 – 2017-09-25 (×11): 3 mL via INTRAVENOUS

## 2017-09-18 MED ORDER — MICONAZOLE POWD: 5.0000 g | Freq: Two times a day (BID) | Status: DC

## 2017-09-18 MED ORDER — FUROSEMIDE 10 MG/ML IJ SOLN
40.0000 mg | Freq: Once | INTRAMUSCULAR | Status: AC
  Administered 2017-09-18: 40 mg via INTRAVENOUS
  Filled 2017-09-18: qty 4

## 2017-09-18 MED ORDER — FENTANYL CITRATE (PF) 100 MCG/2ML IJ SOLN
75.0000 ug | Freq: Once | INTRAMUSCULAR | Status: AC
  Administered 2017-09-18: 75 ug via INTRAVENOUS
  Filled 2017-09-18: qty 2

## 2017-09-18 MED ORDER — ONDANSETRON HCL 4 MG/2ML IJ SOLN: 4.0000 mg | Freq: Four times a day (QID) | INTRAMUSCULAR | Status: DC | PRN

## 2017-09-18 MED ORDER — SODIUM CHLORIDE 0.9 % IV SOLN: 250.0000 mL | INTRAVENOUS | Status: DC | PRN

## 2017-09-18 MED ORDER — ACETAMINOPHEN 325 MG PO TABS
650.0000 mg | ORAL_TABLET | ORAL | Status: DC | PRN
  Administered 2017-09-19 – 2017-09-22 (×3): 650 mg via ORAL
  Filled 2017-09-18 (×3): qty 2

## 2017-09-18 MED ORDER — SODIUM CHLORIDE 0.9% FLUSH: 3.0000 mL | INTRAVENOUS | Status: DC | PRN

## 2017-09-18 MED ORDER — ONDANSETRON HCL 4 MG/2ML IJ SOLN
4.0000 mg | Freq: Once | INTRAMUSCULAR | Status: AC
  Administered 2017-09-18: 4 mg via INTRAVENOUS
  Filled 2017-09-18: qty 2

## 2017-09-18 MED ORDER — FUROSEMIDE 10 MG/ML IJ SOLN
40.0000 mg | Freq: Two times a day (BID) | INTRAMUSCULAR | Status: DC
  Administered 2017-09-19 – 2017-09-26 (×14): 40 mg via INTRAVENOUS
  Filled 2017-09-18 (×14): qty 4

## 2017-09-18 MED ORDER — MICONAZOLE NITRATE POWD
Freq: Two times a day (BID) | Status: DC
  Administered 2017-09-19 – 2017-09-22 (×8): via TOPICAL
  Administered 2017-09-23: 1 via TOPICAL
  Administered 2017-09-23 – 2017-09-26 (×6): via TOPICAL
  Filled 2017-09-18: qty 100

## 2017-09-18 MED ORDER — RIVAROXABAN 20 MG PO TABS
20.0000 mg | ORAL_TABLET | Freq: Every day | ORAL | Status: DC
  Administered 2017-09-18 – 2017-09-26 (×9): 20 mg via ORAL
  Filled 2017-09-18 (×9): qty 1

## 2017-09-18 MED ORDER — RISAQUAD PO CAPS
1.0000 | ORAL_CAPSULE | Freq: Every day | ORAL | Status: DC
  Administered 2017-09-18 – 2017-09-26 (×9): 1 via ORAL
  Filled 2017-09-18 (×9): qty 1

## 2017-09-18 MED ORDER — HYDROCODONE-ACETAMINOPHEN 10-325 MG PO TABS
1.0000 | ORAL_TABLET | Freq: Four times a day (QID) | ORAL | Status: DC | PRN
  Administered 2017-09-18 – 2017-09-22 (×10): 1 via ORAL
  Filled 2017-09-18 (×10): qty 1

## 2017-09-18 MED ORDER — MIDODRINE HCL 5 MG PO TABS
10.0000 mg | ORAL_TABLET | Freq: Three times a day (TID) | ORAL | Status: DC
  Administered 2017-09-19 – 2017-09-26 (×24): 10 mg via ORAL
  Filled 2017-09-18 (×25): qty 2

## 2017-09-18 MED ORDER — MIDODRINE HCL 5 MG PO TABS
10.0000 mg | ORAL_TABLET | Freq: Once | ORAL | Status: AC
  Administered 2017-09-18: 10 mg via ORAL
  Filled 2017-09-18: qty 2

## 2017-09-18 MED ORDER — NYSTATIN 100000 UNIT/GM EX POWD: Freq: Two times a day (BID) | CUTANEOUS | Status: DC

## 2017-09-18 MED ORDER — ACYCLOVIR 400 MG PO TABS
400.0000 mg | ORAL_TABLET | Freq: Every day | ORAL | Status: DC
  Administered 2017-09-18 – 2017-09-26 (×9): 400 mg via ORAL
  Filled 2017-09-18 (×9): qty 1

## 2017-09-18 MED ORDER — SENNA 8.6 MG PO TABS
1.0000 | ORAL_TABLET | Freq: Every day | ORAL | Status: DC
  Administered 2017-09-19 – 2017-09-24 (×6): 8.6 mg via ORAL
  Filled 2017-09-18 (×6): qty 1

## 2017-09-18 MED ORDER — FENTANYL CITRATE (PF) 100 MCG/2ML IJ SOLN
25.0000 ug | INTRAMUSCULAR | Status: DC | PRN
  Administered 2017-09-18 – 2017-09-22 (×10): 25 ug via INTRAVENOUS
  Filled 2017-09-18 (×10): qty 2

## 2017-09-18 MED ORDER — POTASSIUM CHLORIDE CRYS ER 20 MEQ PO TBCR
20.0000 meq | EXTENDED_RELEASE_TABLET | Freq: Two times a day (BID) | ORAL | Status: DC
  Administered 2017-09-18 – 2017-09-26 (×16): 20 meq via ORAL
  Filled 2017-09-18 (×16): qty 1

## 2017-09-18 NOTE — ED Triage Notes (Signed)
Per EMS: Pt is coming from home, where his wife cares for him. Pt is complaining of weakness. Pt has bilateral edema in both feet and hands.  Pt's family reports he is normally able to walk and sit up and has not been able to recently.  Pt is alert and oriented x4. Pt's normal BP is 90's/60's and his current BP 96/53, which is normal per family.  Pt reports pain everywhere.

## 2017-09-18 NOTE — Telephone Encounter (Signed)
Phone call from pt's [facility] RN from [facility]; reported pt. Has changed from baseline.  Family reported the change started 2 days ago, "where he did not seem right."  Today, HH RN, reported that he has very dark urine, and only 140 cc output in 8 hrs. Noted to have increased lethargy, fatigue, is difficult to arouse, and not taking oral fluids.  HH RN stated he has lymphoma, and his "baseline is usually weak, tired, and poor endurance, but today he is dramatically worse."  Reported he has chronic pain in back and neck, and is on 02, and is short of breath, at baseline.  Reported his vital signs were stable.  Due to severe weakness and dramatic change in condition, advised to call EMS.   Nurse agreed, and will contact EMS.    Reason for Disposition . [1] SEVERE weakness (i.e., unable to walk or barely able to walk, requires support) AND     [2] new onset or worsening  Answer Assessment - Initial Assessment Questions 1. DESCRIPTION: "Describe how you are feeling."     Increased weakness, lethargy, urine dark, unable to drink; difficult to arouse 2. SEVERITY: "How bad is it?"  "Can you stand and walk?"   - MILD - Feels weak or tired, but does not interfere with work, school or normal activities   - [place] to stand and walk; weakness interferes with work, school, or normal activities   - SEVERE - Unable to stand or walk     severe 3. ONSET:  "When did the weakness begin?"    2 days ago  4. CAUSE: "What do you think is causing the weakness?"     *No Answer* 5. MEDICINES: "Have you recently started a new medicine or had a change in the amount of a medicine?"     *No Answer* 6. OTHER SYMPTOMS: "Do you have any other symptoms?" (e.g., chest pain, fever, cough, SOB, vomiting, diarrhea, bleeding)     Has lymphoma; always weak, tired, poor endurance; much worse;  7. PREGNANCY: "Is there any chance you are pregnant?" "When was your last menstrual period?"     *No Answer*  Protocols used: WEAKNESS  (GENERALIZED) AND FATIGUE-A-AH

## 2017-09-18 NOTE — Telephone Encounter (Signed)
Called pt's wife, LVM.

## 2017-09-18 NOTE — ED Notes (Signed)
attempted  To call report to floor room 1223-01 unable to take at this time.

## 2017-09-18 NOTE — ED Notes (Signed)
CRITICAL VALUE STICKER  CRITICAL VALUE: trop 0.10  RECEIVER (on-site recipient of call): Jake T RN  DATE & TIME NOTIFIED: 09/18/17 618p  MESSENGER (representative from lab): Nunzio Cory  MD NOTIFIED: Ellender Hose, MD  TIME OF NOTIFICATION: 618p  RESPONSE: see orders

## 2017-09-18 NOTE — H&P (Signed)
History and Physical    [name] IRJ:[id number] DOB: Dec 27, 1945 DOA: 09/18/2017  Referring MD/NP/PA: Dr. Duffy Bruce PCP: Biagio Borg, MD  Patient coming from: Home via EMS  Chief Complaint: Weakness  I have personally briefly reviewed patient's old medical records in [place]   HPI: [name] is a 72 y.o. male with medical history significant of diastolic CHF last EF 55 to 60%, A. fib on Xarelto, COPD, oxygen dependent on 3 L, and malignant lymphoplasmayctic lymphoma; who presents with complaints of weakness over the last 2-3 days.  At baseline the patient is cared for by his wife along with home health aides.  He is normally able to stand to use the restroom, sit up, watch TV, and is more conversant.  However,  he has been more lethargic sleeping all day and not getting up.  Notes being very fatigued and has had increased swelling of his legs and hands.  Patient also complains of having severe.  Pain all over for which lidocaine patches and hydrocodone have provided minimum relief.  Associated symptoms include rash of his inguinal area.  Denies any significant chest pain, shortness of breath, nausea, vomiting, abdominal pain, dysuria, blood in stool/urine, or diarrhea symptoms.  He had just recently had chemotherapy approximately 3 weeks ago, and is followed by Dr. Irene Limbo.    ED Course: Upon admission into the emergency department patient was noted to have a pulse of 59-78, respirations 11-19, blood pressure 94/66-99/70, O2 saturations 95 to 100% on 3 L.  Lab work revealed WBC 10.3, hemoglobin 9.6, BUN 28, creatinine 0.77, glucose 87, lactic acid 0.69, INR 2, troponin 0.1, and BNP 1924.4.  Chest x-ray showing signs of congestive heart failure with pulmonary edema and pleural effusions.  Patient was given 10 mg of Midrin, 40 mg of Lasix IV, 75 mg of fentanyl IV and 4 mg of Zofran.  Review of Systems  Constitutional: Positive for chills and malaise/fatigue. Negative for  fever.  HENT: Negative for ear discharge and nosebleeds.   Eyes: Negative for photophobia and pain.  Respiratory: Positive for shortness of breath.   Cardiovascular: Positive for leg swelling. Negative for chest pain.  Gastrointestinal: Positive for constipation. Negative for abdominal pain, nausea and vomiting.  Genitourinary: Negative for dysuria and frequency.  Musculoskeletal: Positive for joint pain and myalgias.  Skin: Positive for itching and rash.  Neurological: Positive for weakness. Negative for sensory change, focal weakness and loss of consciousness.  Endo/Heme/Allergies: Negative for polydipsia. Bruises/bleeds easily.  Psychiatric/Behavioral: Negative for hallucinations and substance abuse.    Past Medical History:  Diagnosis Date  . Anxiety   . Arthritis    "neck" (08/15/2015)  . Chronic bronchitis ([place])   . Chronic diastolic CHF (congestive heart failure) ()   . Constipation   . COPD (chronic obstructive pulmonary disease) ([place])   . DDD (degenerative disc disease), cervical   . Depression   . Dilated aortic root ([place])    34m on echo 05/2016  . Edema of left lower extremity   . Facial basal cell cancer   . H/O acne vulgaris 1960s   [facility]  . Headache    history of - left temporal- years ago- not current (08/15/2015)  . Persistent atrial fibrillation (H[facility])    on coumadin with CHADS2VASC score of 2  . Pneumonia 1960s; 2015 X 2  . [place]state [place]   "low spreading; non aggressive  type" (08/15/2015)  . Pulmonary HTN ([place])    PASP 70mHg by echo 05/2016  . Skin cancer    "back"    Past Surgical History:  Procedure Laterality Date  . CARDIOVERSION N/A 10/11/2016   Procedure: CARDIOVERSION;  Surgeon: CSanda Klein MD;  [facility];  Service: Cardiovascular;  Laterality: N/A;  . CARDIOVERSION N/A 02/26/2017   Procedure: CARDIOVERSION;  Surgeon: MLarey Dresser MD;  [facility];  Service: Cardiovascular;  Laterality: N/A;  . COLONOSCOPY    . EXCISIONAL HEMORRHOIDECTOMY  1960s  . INGUINAL HERNIA REPAIR Right 2002  . INGUINAL HERNIA REPAIR Bilateral 08/15/2015  . INGUINAL HERNIA REPAIR Bilateral 08/15/2015   Procedure: OPEN REPAIR RECURRENT RIGHT INGUINAL HERNIA WITH MESH AND REPAIR LEFT INGUINAL HERNIA WITH MESH;  Surgeon: HFanny Skates MD;  [place]  Service: General;  Laterality: Bilateral;  . INSERTION OF MESH Bilateral 08/15/2015   Procedure: INSERTION OF MESH;  Surgeon: HFanny Skates MD;  [place]  Service: General;  Laterality: Bilateral;  . IR FLUORO GUIDE PORT INSERTION RIGHT  06/20/2017  . IR THORACENTESIS ASP PLEURAL SPACE W/IMG GUIDE  11/18/2016  . IR [place] ACCESS RIGHT  06/20/2017  . LYMPH NODE BIOPSY Bilateral 10/22/2016   Procedure: LEFT NECK LYMPH NODE BIOPSY;  Surgeon: VIvin Poot MD;  [place]  Service: Thoracic;  Laterality: Bilateral;  . PROSTATE BIOPSY    . TONSILLECTOMY  1950s  . VIDEO ASSISTED THORACOSCOPY (VATS)/EMPYEMA Right 05/20/2016   Procedure: VIDEO ASSISTED THORACOSCOPY (VATS) with drainage of pleural effusion;  Surgeon: PIvin Poot MD;  [place]  Service: Thoracic;  Laterality: Right;  .Marland KitchenVIDEO BRONCHOSCOPY WITH ENDOBRONCHIAL ULTRASOUND Right 05/20/2016   Procedure: VIDEO BRONCHOSCOPY WITH ENDOBRONCHIAL ULTRASOUND;  Surgeon: PIvin Poot MD;  [place]  Service: Thoracic;  Laterality: Right;     reports that he quit smoking about 16 months ago. His smoking use included cigarettes. He has a 62.00 pack-year smoking history. He has never used smokeless tobacco. He reports that he does not drink alcohol or use drugs.  Allergies  Allergen Reactions  . Demerol [Meperidine] Other (See Comments)    UNSPECIFIED REACTION  Causes system to shutdown. ?   . Oxycodone Hcl Other (See Comments)    Pt states this medication 'wires him up' and makes pt hyper; pt does not want to take this  ever again    Family History  Problem Relation Age of Onset  . Colon cancer Mother   . Ovarian cancer Mother   . Alcoholism Father   . CAD Father   . Alcohol abuse Father   . Ovarian cancer Sister   . Diabetes Neg Hx   . Stomach cancer Neg Hx     Prior to Admission medications   Medication Sig Start Date End Date Taking? Authorizing Provider  acyclovir (ZOVIRAX) 400 MG tablet Take 1 tablet (400 mg total) by mouth daily. 08/11/17  Yes KBrunetta Genera MD  furosemide (LASIX) 40 MG tablet Take 1 tablet (40 mg total) by mouth daily. 05/23/17  Yes MLarey Dresser MD  HYDROcodone-acetaminophen ([facility] 10-325 MG tablet Take 1 tablet by mouth every 6 (six) hours as needed. Patient taking differently: Take 1 tablet by mouth every 6 (six) hours as needed for moderate pain.  08/05/17  Yes JBiagio Borg MD  midodrine (PROAMATINE) 10 MG tablet Take 1 tablet (10 mg total) by mouth 3 (three) times daily with meals. Patient taking differently:  Take 10 mg by mouth 2 (two) times daily.  05/12/17  Yes Charlynne Cousins, MD  potassium chloride SA (K-DUR,KLOR-CON) 20 MEQ tablet Take 1 tablet (20 mEq total) by mouth 2 (two) times daily. 07/22/17  Yes Brunetta Genera, MD  Probiotic Product (PROBIOTIC DAILY PO) Take 1 tablet by mouth daily.   Yes [provider]  XARELTO 20 MG TABS tablet TAKE 1 TABLET BY MOUTH EVERY DAY WITH SUPPER 09/05/17  Yes [name], MD  furosemide (LASIX) 40 MG tablet TAKE 1 TABLET BY MOUTH TWICE DAILY FOR 5 DAYS, THEN TAKE 1 TABLET ONCE DAILY Patient not taking: Reported on 09/18/2017 09/03/17   [name], MD  hydrOXYzine (ATARAX/VISTARIL) 10 MG tablet Take 6 tablets (60 mg total) by mouth 3 (three) times daily as needed. Patient not taking: Reported on 09/18/2017 06/16/17   Biagio Borg, MD  lidocaine (LIDODERM) 5 % Place 1 patch onto the skin daily. Remove & Discard patch within 12 hours or as directed by MD Patient not taking: Reported on 09/18/2017 09/01/17    Biagio Borg, MD  Miconazole POWD Apply 5 g topically 2 (two) times daily. To rash in groin. Patient not taking: Reported on 09/18/2017 08/29/17   Wyatt Portela, MD  nystatin cream (MYCOSTATIN) APPLY EXTERNALLY TO THE AFFECTED AREA TWICE DAILY Patient not taking: Reported on 09/18/2017 05/19/17   Biagio Borg, MD  polycarbophil (FIBERCON) 625 MG tablet Take 1 tablet (625 mg total) by mouth daily. Patient not taking: Reported on 09/18/2017 08/21/17   Brunetta Genera, MD  prochlorperazine (COMPAZINE) 10 MG tablet Take 1 tablet (10 mg total) every 6 (six) hours as needed by mouth (Nausea or vomiting). Patient not taking: Reported on 09/18/2017 03/24/17   Brunetta Genera, MD  traZODone (DESYREL) 50 MG tablet Take 0.5-1 tablets (25-50 mg total) by mouth at bedtime as needed for sleep. Patient not taking: Reported on 09/18/2017 05/19/17   Biagio Borg, MD    Physical Exam:  Constitutional: Pale elderly male who appears to be chronically ill Vitals:   09/18/17 1817 09/18/17 1821 09/18/17 1830 09/18/17 1900  BP: _0 (!) 97/54  Pulse: 78 74 67 67  Resp:  _1 SpO2: 97% 95% 99% 100%  Weight:      Height:       Eyes: PERRL, lids and conjunctivae normal ENMT: Mucous membranes are dry. Posterior pharynx clear of any exudate or lesions.   Neck: normal, supple, no masses, no thyromegaly Respiratory: Regular respiratory effort on 3 L of nasal cannula oxygen with decreased aeration and crackles appreciated. Cardiovascular: Irregularly irregular. no murmurs / rubs / gallops.  At least 2+ pitting lower extremity edema. 2+ pedal pulses. No carotid bruits.  Swelling noted of the left upper extremity. Abdomen: no tenderness, no masses palpated. No hepatosplenomegaly. Bowel sounds positive.  Musculoskeletal: no clubbing / cyanosis. No joint deformity upper and lower extremities. Good ROM, no contractures. Normal muscle tone.  Skin: Pallor present.  Inguinal rash present.  Previous  scratch of left dorsal aspect of hand healing. Neurologic: CN 2-12 grossly intact. Sensation intact, DTR normal. Strength 5/5 in all 4.  Psychiatric: Normal judgment and insight. Alert and oriented x 3. Normal mood.     Labs on Admission: I have personally reviewed following labs and imaging studies  CBC: Recent Labs  Lab 09/18/17 1718  WBC 10.3  NEUTROABS 9.7*  HGB 9.6*  HCT 28.8*  MCV 101.1*  PLT  102   Basic Metabolic Panel: Recent Labs  Lab 09/18/17 1718  NA 136  K 3.5  CL 100*  CO2 26  GLUCOSE 87  BUN 28*  CREATININE 0.77  CALCIUM 7.8*   GFR: Estimated Creatinine Clearance: 75.7 mL/min (by C-G formula based on SCr of 0.77 mg/dL). Liver Function Tests: Recent Labs  Lab 09/18/17 1718  AST 15  ALT 10*  ALKPHOS 273*  BILITOT 0.6  PROT 4.4*  ALBUMIN 1.6*   No results for input(s): LIPASE, AMYLASE in the last 168 hours. No results for input(s): AMMONIA in the last 168 hours. Coagulation Profile: Recent Labs  Lab 09/18/17 1718  INR 2.01   Cardiac Enzymes: Recent Labs  Lab 09/18/17 1718  TROPONINI 0.10*   BNP (last 3 results) No results for input(s): PROBNP in the last 8760 hours. HbA1C: No results for input(s): HGBA1C in the last 72 hours. CBG: No results for input(s): GLUCAP in the last 168 hours. Lipid Profile: No results for input(s): CHOL, HDL, LDLCALC, TRIG, CHOLHDL, LDLDIRECT in the last 72 hours. Thyroid Function Tests: No results for input(s): TSH, T4TOTAL, FREET4, T3FREE, THYROIDAB in the last 72 hours. Anemia Panel: No results for input(s): VITAMINB12, FOLATE, FERRITIN, TIBC, IRON, RETICCTPCT in the last 72 hours. Urine analysis:    Component Value Date/Time   COLORURINE YELLOW 05/01/2017 1450   APPEARANCEUR CLOUDY (A) 05/01/2017 1450   LABSPEC 1.020 05/01/2017 1450   PHURINE 6.0 05/01/2017 1450   GLUCOSEU NEGATIVE 05/01/2017 1450   HGBUR LARGE (A) 05/01/2017 1450   BILIRUBINUR NEGATIVE 05/01/2017 1450   KETONESUR NEGATIVE  05/01/2017 1450   PROTEINUR 100 (A) 05/01/2017 1450   NITRITE NEGATIVE 05/01/2017 1450   LEUKOCYTESUR NEGATIVE 05/01/2017 1450   Sepsis Labs: No results found for this or any previous visit (from the past 240 hour(s)).   Radiological Exams on Admission: Dg Chest Portable 1 View  Result Date: 09/18/2017 CLINICAL DATA:  Weakness.  Edema in the feet and hands. EXAM: PORTABLE CHEST 1 VIEW COMPARISON:  05/09/2017 and older exams. FINDINGS: Moderate enlargement of the cardiopericardial silhouette. Bilateral pleural effusions. Bilateral interstitial thickening and hazy central and lung base airspace opacities. No pneumothorax. Stable calcified right paratracheal lymph nodes. Right anterior chest wall Port-A-Cath, new from the prior exam. Catheter tip projects in the right atrium. IMPRESSION: 1. Findings are consistent with congestive heart failure with interstitial and hazy airspace pulmonary edema and bilateral pleural effusions. Electronically Signed   By: Lajean Manes M.D.   On: 09/18/2017 17:38    EKG: Independently reviewed.  Atrial fibrillation at 72 bpm with RBBB.  QTc 498.  Assessment/Plan Diastolic congestive heart failure exacerbation: Acute on chronic.  Patient presents with progressive weakness and lower extremity edema noted.  Physical exam reveals edema and crackles on lung exam.  Chest x-ray showing cardiomegaly with pleural effusions and pulmonary edema.  Patient given 40 mg of Lasix IV. - Admit to a stepdown bed stepdown bed - Heart failure orders set  initiated  - Continuous pulse oximetry with nasal cannula oxygen as needed to keep O2 saturations >92% - Strict I&Os and daily weights - Elevate lower extremities - Lasix 40 mg IV Bid  - Reassess in a.m. and adjust diuresis as needed. - Check echocardiogram - Optimize medical management as able - Message sent for cardiology to eval in a.m.   Hypotension: Chronic.  Patient's baseline systolic blood pressures appear to be in the  90s.  Patient currently takes Midodrin 2 times daily. - Continue Midodrin, but  as prescribed 3 times daily  Elevated troponin: Acute on chronic.  Initial troponin noted to be 0.1 on admission.  Suspect likely secondary to patient being acutely fluid overloaded.  EKG showing similar T wave inversions from previous tracings. - Continue to trend cardiac troponin  Macrocytic anemia: chronic.  Hemoglobin 9.6 on admission normally patient hemoglobin has been around 10-11.  Tinea cruris: Acute. - Continue miconazole  Malignant lymphoplasmayctic lymphoma: Patient currently receiving chemotherapy for treatment and is followed by Dr. Irene Limbo. - Dr. Irene Limbo  added to treatment team  Nausea and vomiting - Continue to monitor at this time holding Zofran due to prolonged QT interval  Atrial fibrillation: Chronic. CHA2DS2-VASc score = at least 2 - Continue Xarelto  Generalized weakness: - Physical therapy to eval and treat  GERD - Pharmacy substitution of Protonix   DVT prophylaxis: Xarelto Code Status: Full Family Communication: Discussed plan of care with the patient family present at bedside Disposition Plan: To be discharged home once medically stable Consults called: None  Admission status: inpatient  Norval Morton MD [facility] Pager [phone]   If 7PM-7AM, please contact night-coverage [email or web address] Password [facility]  09/18/2017, 7:39 PM
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
## 2017-09-18 NOTE — Telephone Encounter (Signed)
Pt's wife returned call. Reports pt " more alert."  Still with c/o muscular pain "all over body aches." Holding head, moaning but wife states he does this often, not new behavior. Reports hydrocodone ineffective for pain control, "Only knocks him out." States very difficult to get pt to practice.  Labs ordered yesterday evening will be drawn by [facility] nurse this am. Wife states "We'll go from there."

## 2017-09-18 NOTE — ED Provider Notes (Signed)
[facility] DEPT Provider Note   CSN: [id number] Arrival date & time: 09/18/17  1619     History   Chief Complaint Chief Complaint  Patient presents with  . Weakness    HPI [name] is a 72 y.o. male.  HPI   72 year old male with history of chronic heart failure, prostate cancer, pulmonary hypertension, A. 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  The patient states that since his most recent course of chemotherapy, he has had persistent nausea, vomiting, and diarrhea.  The patient states that over the last several days, he is gotten progressively more and more weak.  He is essentially been unable to get himself out of his chair.  He has had poor appetite.  He has had decreased urine output over the last 24 hours.  He endorses chronic, severe pain diffusely.  Denies any new areas of pain.  His diarrhea has improved over the last 2 days.  He called his PCP and was told to come to the ER.   Past Medical History:  Diagnosis Date  . Anxiety   . Arthritis    "neck" (08/15/2015)  . Chronic bronchitis ([place])   . Chronic diastolic CHF (congestive heart failure) ([place])   . Constipation   . COPD (chronic obstructive pulmonary disease) ([place])   . DDD (degenerative disc disease), cervical   . Depression   . Dilated aortic root ([place])    44m on echo 05/2016  . Edema of left lower extremity   . Facial basal cell cancer   . H/O acne vulgaris 1960s   [facility]  . Headache    history of - left temporal- years ago- not current (08/15/2015)  . Persistent atrial fibrillation (HCC)    on coumadin with CHADS2VASC score of 2  . Pneumonia 1960s; 2015 X 2  . Prostate cancer ([place] dx'd early 2000s   "low spreading; non aggressive type" (08/15/2015)  . Pulmonary HTN (HOnycha    PASP 447mg by echo 05/2016  . Skin cancer    "back"    Patient Active Problem  List   Diagnosis Date Noted  . Diastolic CHF (HCLakeway0552/84/1324. Port or reservoir infection 07/22/2017  . Port-A-Cath in place 07/22/2017  . Palliative care encounter   . Malnutrition of moderate degree 05/02/2017  . Pressure injury of skin 05/01/2017  . Syncope and collapse 04/30/2017  . Right-sided thoracic back pain 03/26/2017  . Insomnia 03/26/2017  . Lymphedema of left arm 03/26/2017  . Malignant lymphoplasmacytic lymphoma (HCStoy11/04/2017  . Counseling regarding advanced care planning and goals of care 03/24/2017  . Pain management contract signed 03/10/2017  . Chronic generalized pain disorder 03/04/2017  . Light chain (AL) amyloidosis (HCC)   . Nephrotic syndrome   . Proteinuria   . Dyspnea   . Left arm swelling   . Acute on chronic diastolic CHF (congestive heart failure) (HCLogan  . Elevated brain natriuretic peptide (BNP) level   . Acute on chronic diastolic (congestive) heart failure (HCRentchler10/04/2017  . Hypotension 02/21/2017  . Fluid overload 02/21/2017  . Mediastinal lymphadenopathy 02/10/2017  . Adjustment disorder with depressed mood 12/03/2016  . Bacteremia due to Streptococcus pneumoniae 11/19/2016  . Sepsis (HCLodge07/02/2017  . Hypoalbuminemia 11/19/2016  . Right leg pain 11/19/2016  . AKI (acute kidney injury) (HCRufus07/02/2017  . Acute urinary retention 11/19/2016  . Hyponatremia 11/18/2016  .  Pneumonia 11/18/2016  . HCAP (healthcare-associated pneumonia) 11/18/2016  . History of prostate cancer 11/15/2016  . Chronic post-operative pain 11/15/2016  . Persistent atrial fibrillation ([place])   . Acute on chronic combined systolic and diastolic CHF (congestive heart failure) ([place])   . Pulmonary HTN ([place])   . Dilated aortic root ([place])   . Septic shock ([place])   . Pedal edema   . Acute respiratory failure with hypoxia ([place]) 05/21/2016  . Lung mass 05/21/2016  . Loculated pleural effusion 05/21/2016  . Empyema ([place]) 05/21/2016  . S/P thoracentesis   .  Bilateral pleural effusion 05/14/2016  . COPD mixed type ([place]) 05/14/2016  . Tobacco abuse 05/14/2016  . Protein-calorie malnutrition, severe ([place]) 05/14/2016    Past Surgical History:  Procedure Laterality Date  . CARDIOVERSION N/A 10/11/2016   Procedure: CARDIOVERSION;  Surgeon: Sanda Klein, MD;  Location: [facility] ENDOSCOPY;  Service: Cardiovascular;  Laterality: N/A;  . CARDIOVERSION N/A 02/26/2017   Procedure: CARDIOVERSION;  Surgeon: [name], MD;  Location[facility] END[facility]COPY;  Service: Cardiovascular;  Laterality: N/A;  . COLONOSCOPY    . EXCISIONAL HEMORRHOIDECTOMY  1960s  . INGUINAL HERNIA REPAIR Right 2002  . INGUINAL HERNIA REPAIR Bilateral 08/15/2015  . INGUINAL HERNIA REPAIR Bilateral 08/15/2015   Procedure: OPEN REPAIR RECURRENT RIGHT INGUINAL HERNIA WITH MESH AND REPAIR LEFT INGUINAL HERNIA WITH MESH;  Surgeon: [name], MD;  Location: [facility];  Service: General;  Laterality: Bilateral;  . INSERTION OF MESH Bilateral 08/15/2015   Procedure: INSERTION OF MESH;  Surgeon: [name], MD;  Location: [facility];  Service: General;  Laterality: Bilateral;  . IR FLUORO GUIDE PORT INSERTION RIGHT  06/20/2017  . IR THORACENTESIS ASP PLEURAL SPACE W/IMG GUIDE  11/18/2016  . IR US GUIDE VASC ACCESS RIGHT  06/20/2017  . LYMPH NODE BIOPSY Bilateral 10/22/2016   Procedure: LEFT NECK LYMPH NODE BIOPSY;  Surgeon: [name], MD;  Location: [facility];  Service: Thoracic;  Laterality: Bilateral;  . PROSTATE BIOPSY    . TONSILLECTOMY  1950s  . VIDEO ASSISTED THORACOSCOPY (VATS)/EMPYEMA Right 05/20/2016   Procedure: VIDEO ASSISTED THORACOSCOPY (VATS) with drainage of pleural effusion;  Surgeon: [name], MD;  Location: [place] [place];  Service: Thoracic;  Laterality: Right;  [name] VIDEO BRONCHOSCOPY WITH ENDOBRONCHIAL ULTRASOUND Right 05/20/2016   Procedure: VIDEO BRONCHOSCOPY WITH ENDOBRONCHIAL ULTRASOUND;  Surgeon: [name], MD;  Location: [facility];  Service: Thoracic;  Laterality: Right;         Home Medications    Prior to Admission medications   Medication Sig Start Date End Date Taking? Authorizing Provider  acyclovir (ZOVIRAX) 400 MG tablet Take 1 tablet (400 mg total) by mouth daily. 08/11/17  Yes Brunetta Genera, MD  furosemide (LASIX) 40 MG tablet Take 1 tablet (40 mg total) by mouth daily. 05/23/17  Yes [name], MD  HYDROcodone-acetaminophen [facility]) 10-325 MG tablet Take 1 tablet by mouth every 6 (six) hours as needed. Patient taking differently: Take 1 tablet by mouth every 6 (six) hours as needed for moderate pain.  08/05/17  Yes Biagio Borg, MD  midodrine (PROAMATINE) 10 MG tablet Take 1 tablet (10 mg total) by mouth 3 (three) times daily with meals. Patient taking differently: Take 10 mg by mouth 2 (two) times daily.  05/12/17  Yes Charlynne Cousins, MD  potassium chloride SA (K-DUR,KLOR-CON) 20 MEQ tablet Take 1 tablet (20 mEq total) by mouth 2 (two) times daily. 07/22/17  Yes Brunetta Genera, MD  Probiotic  Product (PROBIOTIC DAILY PO) Take 1 tablet by mouth daily.   Yes [provider]  XARELTO 20 MG TABS tablet TAKE 1 TABLET BY MOUTH EVERY DAY WITH SUPPER 09/05/17  Yes [name], MD  furosemide (LASIX) 40 MG tablet TAKE 1 TABLET BY MOUTH TWICE DAILY FOR 5 DAYS, THEN TAKE 1 TABLET ONCE DAILY Patient not taking: Reported on 09/18/2017 09/03/17   [name], MD  hydrOXYzine (ATARAX/VISTARIL) 10 MG tablet Take 6 tablets (60 mg total) by mouth 3 (three) times daily as needed. Patient not taking: Reported on 09/18/2017 06/16/17   Biagio Borg, MD  lidocaine (LIDODERM) 5 % Place 1 patch onto the skin daily. Remove & Discard patch within 12 hours or as directed by MD Patient not taking: Reported on 09/18/2017 09/01/17   Biagio Borg, MD  Miconazole POWD Apply 5 g topically 2 (two) times daily. To rash in groin. Patient not taking: Reported on 09/18/2017 08/29/17   Wyatt Portela, MD  nystatin cream (MYCOSTATIN) APPLY EXTERNALLY TO THE  AFFECTED AREA TWICE DAILY Patient not taking: Reported on 09/18/2017 05/19/17   Biagio Borg, MD  polycarbophil (FIBERCON) 625 MG tablet Take 1 tablet (625 mg total) by mouth daily. Patient not taking: Reported on 09/18/2017 08/21/17   Brunetta Genera, MD  prochlorperazine (COMPAZINE) 10 MG tablet Take 1 tablet (10 mg total) every 6 (six) hours as needed by mouth (Nausea or vomiting). Patient not taking: Reported on 09/18/2017 03/24/17   Brunetta Genera, MD  traZODone (DESYREL) 50 MG tablet Take 0.5-1 tablets (25-50 mg total) by mouth at bedtime as needed for sleep. Patient not taking: Reported on 09/18/2017 05/19/17   Biagio Borg, MD    Family History Family History  Problem Relation Age of Onset  . Colon cancer Mother   . Ovarian cancer Mother   . Alcoholism Father   . CAD Father   . Alcohol abuse Father   . Ovarian cancer Sister   . Diabetes Neg Hx   . Stomach cancer Neg Hx     Social History Social History   Tobacco Use  . Smoking status: Former Smoker    Packs/day: 1.00    Years: 62.00    Pack years: 62.00    Types: Cigarettes    Last attempt to quit: 05/14/2016    Years since quitting: 1.3  . Smokeless tobacco: Never Used  Substance Use Topics  . Alcohol use: No    Alcohol/week: 0.0 oz  . Drug use: No    Comment: CBD OIL     Allergies   Demerol [meperidine] and Oxycodone hcl   Review of Systems Review of Systems  Constitutional: Positive for fatigue.  Respiratory: Positive for shortness of breath.   Cardiovascular: Positive for leg swelling.  Neurological: Positive for weakness.  All other systems reviewed and are negative.    Physical Exam Updated Vital Signs BP 103/69   Pulse 86   Resp (!) 24   Ht _0  (1.702 m)   Wt 69.9 kg (154 lb)   SpO2 95%   BMI 24.12 kg/m   Physical Exam  Constitutional: He is oriented to person, place, and time. He appears well-developed and well-nourished. He has a sickly appearance. He appears ill. No distress.   HENT:  Head: Normocephalic and atraumatic.  Dry mucous membranes  Eyes: Conjunctivae are normal.  Neck: Neck supple.  Cardiovascular: Normal rate and normal heart sounds. An irregularly irregular rhythm present. Exam reveals no friction  rub.  No murmur heard. Pulmonary/Chest: Effort normal. Tachypnea noted. No respiratory distress. He has decreased breath sounds. He has no wheezes. He has rales in the right lower field and the left lower field.  Abdominal: He exhibits no distension.  Musculoskeletal: He exhibits no edema.  Neurological: He is alert and oriented to person, place, and time. He exhibits normal muscle tone.  Appears drowsy but is oriented x4.  Moves all extremities.  Face symmetric.  No cranial nerve deficits.  Skin: Skin is warm. Capillary refill takes less than 2 seconds.  No skin sores.  Psychiatric: He has a normal mood and affect.  Nursing note and vitals reviewed.    ED Treatments / Results  Labs (all labs ordered are listed, but only abnormal results are displayed) Labs Reviewed  CBC WITH DIFFERENTIAL/PLATELET - Abnormal; Notable for the following components:      Result Value   RBC 2.85 (*)    Hemoglobin 9.6 (*)    HCT 28.8 (*)    MCV 101.1 (*)    RDW 15.8 (*)    Neutro Abs 9.7 (*)    Lymphs Abs 0.3 (*)    All other components within normal limits  COMPREHENSIVE METABOLIC PANEL - Abnormal; Notable for the following components:   Chloride 100 (*)    BUN 28 (*)    Calcium 7.8 (*)    Total Protein 4.4 (*)    Albumin 1.6 (*)    ALT 10 (*)    Alkaline Phosphatase 273 (*)    All other components within normal limits  BRAIN NATRIURETIC PEPTIDE - Abnormal; Notable for the following components:   B Natriuretic Peptide 1,924.4 (*)    All other components within normal limits  TROPONIN I - Abnormal; Notable for the following components:   Troponin I 0.10 (*)    All other components within normal limits  URINALYSIS, ROUTINE W REFLEX MICROSCOPIC - Abnormal;  Notable for the following components:   APPearance HAZY (*)    Hgb urine dipstick SMALL (*)    Protein, ur 100 (*)    Leukocytes, UA TRACE (*)    All other components within normal limits  PROTIME-INR - Abnormal; Notable for the following components:   Prothrombin Time 22.6 (*)    All other components within normal limits  MRSA PCR SCREENING  BASIC METABOLIC PANEL  TROPONIN I  TROPONIN I  TROPONIN I  CBC WITH DIFFERENTIAL/PLATELET  I-STAT CG4 LACTIC ACID, ED    EKG EKG Interpretation  Date/Time:  Thursday Sep 18 2017 18:57:12 EDT Ventricular Rate:  72 PR Interval:    QRS Duration: 125 QT Interval:  455 QTC Calculation: 498 R Axis:   95 Text Interpretation:  Atrial fibrillation Right bundle branch block Repol abnrm suggests ischemia, anterolateral Since last EKG, persistent ST depressions in lateral leads No other significant changes Confirmed by Duffy Bruce [id number]) on 09/18/2017 7:42:34 PM   Radiology Dg Chest Portable 1 View  Result Date: 09/18/2017 CLINICAL DATA:  Weakness.  Edema in the feet and hands. EXAM: PORTABLE CHEST 1 VIEW COMPARISON:  05/09/2017 and older exams. FINDINGS: Moderate enlargement of the cardiopericardial silhouette. Bilateral pleural effusions. Bilateral interstitial thickening and hazy central and lung base airspace opacities. No pneumothorax. Stable calcified right paratracheal lymph nodes. Right anterior chest wall Port-A-Cath, new from the prior exam. Catheter tip projects in the right atrium. IMPRESSION: 1. Findings are consistent with congestive heart failure with interstitial and hazy airspace pulmonary edema and bilateral pleural effusions. Electronically Signed  By: Lajean Manes M.D.   On: 09/18/2017 17:38    Procedures Procedures (including critical care time)  Medications Ordered in ED Medications  midodrine (PROAMATINE) tablet 10 mg (has no administration in time range)  potassium chloride SA (K-DUR,KLOR-CON) CR tablet 20 mEq (20 mEq  Oral Given 09/18/17 2316)  acidophilus (RISAQUAD) capsule 1 capsule (has no administration in time range)  rivaroxaban (XARELTO) tablet 20 mg (has no administration in time range)  acyclovir (ZOVIRAX) tablet 400 mg (400 mg Oral Given 09/18/17 2318)  sodium chloride flush (NS) 0.9 % injection 3 mL (3 mLs Intravenous Given 09/18/17 2210)  sodium chloride flush (NS) 0.9 % injection 3 mL (has no administration in time range)  0.9 %  sodium chloride infusion (has no administration in time range)  acetaminophen (TYLENOL) tablet 650 mg (has no administration in time range)  ondansetron (ZOFRAN) injection 4 mg (has no administration in time range)  furosemide (LASIX) injection 40 mg (has no administration in time range)  HYDROcodone-acetaminophen (NORCO) 10-325 MG per tablet 1 tablet (1 tablet Oral Given 09/18/17 2316)  senna (SENOKOT) tablet 8.6 mg (8.6 mg Oral Not Given 09/18/17 2321)  fentaNYL (SUBLIMAZE) injection 25 mcg (25 mcg Intravenous Given 09/18/17 2118)  miconazole nitrate (MICATIN) topical powder (has no administration in time range)  fentaNYL (SUBLIMAZE) injection 75 mcg (75 mcg Intravenous Given 09/18/17 1745)  ondansetron (ZOFRAN) injection 4 mg (4 mg Intravenous Given 09/18/17 1744)  furosemide (LASIX) injection 40 mg (40 mg Intravenous Given 09/18/17 1924)  midodrine (PROAMATINE) tablet 10 mg (10 mg Oral Given 09/18/17 2049)     Initial Impression / Assessment and Plan / ED Course  I have reviewed the triage vital signs and the nursing notes.  Pertinent labs & imaging results that were available during my care of the patient were reviewed by me and considered in my medical decision making (see chart for details).  Clinical Course as of Sep 19 2326  Thu Sep 18, 5144  4128 72 year old male here with generalized weakness in the setting of chemotherapy for lymphoma.  Differential is broad.  Primary consideration is intravascular depletion and dehydration in the setting of persistent nausea and  vomiting, with subsequent decreased urine output.  Must consider occult infection as well, particularly UTI or pneumonia.  He does have bibasilar rales.  Patient is edematous on exam with history of CHF, so will hold on fluids at this time, but may need very gentle fluids if he is dehydrated on lab work.  No fevers or signs of sepsis.  He is alert and oriented x4.   [CI]    Clinical Course User Index [CI] Duffy Bruce, MD    Lab work, imaging is c/w CHF. Renal function normal. Will give IV lasix, monitor for signs of dehydration. Trop elevated likely 2/2 demand. EKG with persistent but no new TW changes in precordial leads. Admit to medicine.  Final Clinical Impressions(s) / ED Diagnoses   Final diagnoses:  Acute on chronic heart failure, unspecified heart failure type [facility])  Weakness    ED Discharge Orders    None       Duffy Bruce, MD 09/18/17 2328

## 2017-09-18 NOTE — Care Management Note (Signed)
Case Management Note  Patient Details  Name: [name] MRN: [id number] Date of Birth: 20-May-1945  CM contacted by Dorian Pod with [facility] who advised pt is currently active with their services.  Expected Discharge Date:   Unknown               Expected Discharge Plan:  [place] Choice:  Home Health Choice offered to:  Patient  [name] Agency:  [facility]  Status of Service:  In process, will continue to follow  Rae Mar, RN 09/18/2017, 4:44 PM

## 2017-09-19 ENCOUNTER — Encounter (HOSPITAL_COMMUNITY): Payer: Self-pay | Admitting: Physician Assistant

## 2017-09-19 ENCOUNTER — Inpatient Hospital Stay (HOSPITAL_COMMUNITY): Payer: Medicare Other

## 2017-09-19 ENCOUNTER — Other Ambulatory Visit: Payer: Self-pay

## 2017-09-19 DIAGNOSIS — R7989 Other specified abnormal findings of blood chemistry: Secondary | ICD-10-CM | POA: Diagnosis present

## 2017-09-19 DIAGNOSIS — C8300 Small cell B-cell lymphoma, unspecified site: Secondary | ICD-10-CM

## 2017-09-19 DIAGNOSIS — E8581 Light chain (AL) amyloidosis: Secondary | ICD-10-CM

## 2017-09-19 DIAGNOSIS — I950 Idiopathic hypotension: Secondary | ICD-10-CM

## 2017-09-19 DIAGNOSIS — R531 Weakness: Secondary | ICD-10-CM

## 2017-09-19 DIAGNOSIS — R778 Other specified abnormalities of plasma proteins: Secondary | ICD-10-CM | POA: Diagnosis present

## 2017-09-19 DIAGNOSIS — I5033 Acute on chronic diastolic (congestive) heart failure: Principal | ICD-10-CM

## 2017-09-19 DIAGNOSIS — R748 Abnormal levels of other serum enzymes: Secondary | ICD-10-CM

## 2017-09-19 DIAGNOSIS — B356 Tinea cruris: Secondary | ICD-10-CM | POA: Diagnosis present

## 2017-09-19 DIAGNOSIS — N049 Nephrotic syndrome with unspecified morphologic changes: Secondary | ICD-10-CM

## 2017-09-19 DIAGNOSIS — I340 Nonrheumatic mitral (valve) insufficiency: Secondary | ICD-10-CM

## 2017-09-19 LAB — BASIC METABOLIC PANEL
Anion gap: 10 (ref 5–15)
BUN: 31 mg/dL — AB (ref 6–20)
CHLORIDE: 101 mmol/L (ref 101–111)
CO2: 26 mmol/L (ref 22–32)
Calcium: 8 mg/dL — ABNORMAL LOW (ref 8.9–10.3)
Creatinine, Ser: 0.9 mg/dL (ref 0.61–1.24)
GFR calc Af Amer: >60 mL/min (ref >60)
GFR calc non Af Amer: >60 mL/min (ref >60)
GLUCOSE: 85 mg/dL (ref 65–99)
POTASSIUM: 3.9 mmol/L (ref 3.5–5.1)
Sodium: 137 mmol/L (ref 135–145)

## 2017-09-19 LAB — CBC WITH DIFFERENTIAL/PLATELET
BASOS ABS: 0 10*3/uL (ref 0.0–0.1)
BASOS PCT: 0 %
Eosinophils Absolute: 0 10*3/uL (ref 0.0–0.7)
Eosinophils Relative: 0 %
HEMATOCRIT: 29.7 % — AB (ref 39.0–52.0)
HEMOGLOBIN: 9.8 g/dL — AB (ref 13.0–17.0)
Lymphocytes Relative: 3 %
Lymphs Abs: 0.3 10*3/uL — ABNORMAL LOW (ref 0.7–4.0)
MCH: 33.8 pg (ref 26.0–34.0)
MCHC: 33 g/dL (ref 30.0–36.0)
MCV: 102.4 fL — ABNORMAL HIGH (ref 78.0–100.0)
Monocytes Absolute: 0.4 10*3/uL (ref 0.1–1.0)
Monocytes Relative: 5 %
NEUTROS ABS: 8.4 10*3/uL — AB (ref 1.7–7.7)
NEUTROS PCT: 92 %
Platelets: 296 10*3/uL (ref 150–400)
RBC: 2.9 MIL/uL — ABNORMAL LOW (ref 4.22–5.81)
RDW: 15.3 % (ref 11.5–15.5)
WBC: 9.1 10*3/uL (ref 4.0–10.5)

## 2017-09-19 LAB — ECHOCARDIOGRAM COMPLETE
HEIGHTINCHES: 67 in
Weight: 2536.17 oz

## 2017-09-19 LAB — TROPONIN I
Troponin I: 0.1 ng/mL (ref <0.03)
Troponin I: 0.09 ng/mL (ref <0.03)

## 2017-09-19 LAB — RETICULOCYTES
RBC.: 2.89 MIL/uL — AB (ref 4.22–5.81)
RETIC COUNT ABSOLUTE: 40.5 10*3/uL (ref 19.0–186.0)
RETIC CT PCT: 1.4 % (ref 0.4–3.1)

## 2017-09-19 LAB — IRON AND TIBC
Iron: 29 ug/dL — ABNORMAL LOW (ref 45–182)
SATURATION RATIOS: 22 % (ref 17.9–39.5)
TIBC: 132 ug/dL — ABNORMAL LOW (ref 250–450)
UIBC: 103 ug/dL

## 2017-09-19 LAB — VITAMIN B12: Vitamin B-12: 4049 pg/mL — ABNORMAL HIGH (ref 180–914)

## 2017-09-19 LAB — FERRITIN: Ferritin: 915 ng/mL — ABNORMAL HIGH (ref 24–336)

## 2017-09-19 LAB — MRSA PCR SCREENING: MRSA by PCR: NEGATIVE

## 2017-09-19 LAB — FOLATE: Folate: 5.9 ng/mL — ABNORMAL LOW (ref >5.9)

## 2017-09-19 MED ORDER — SODIUM CHLORIDE 0.9% FLUSH
10.0000 mL | INTRAVENOUS | Status: DC | PRN
  Administered 2017-09-23 – 2017-09-26 (×2): 10 mL
  Filled 2017-09-19 (×2): qty 40

## 2017-09-19 MED ORDER — SODIUM CHLORIDE 0.9% FLUSH
10.0000 mL | Freq: Two times a day (BID) | INTRAVENOUS | Status: DC
  Administered 2017-09-19 – 2017-09-25 (×9): 10 mL

## 2017-09-19 MED ORDER — PREGABALIN 25 MG PO CAPS
25.0000 mg | ORAL_CAPSULE | Freq: Two times a day (BID) | ORAL | Status: DC
  Administered 2017-09-19 – 2017-09-24 (×11): 25 mg via ORAL
  Filled 2017-09-19 (×11): qty 1

## 2017-09-19 MED ORDER — CHLORHEXIDINE GLUCONATE CLOTH 2 % EX PADS
6.0000 | MEDICATED_PAD | Freq: Every day | CUTANEOUS | Status: DC
  Administered 2017-09-20 – 2017-09-26 (×7): 6 via TOPICAL

## 2017-09-19 MED ORDER — MORPHINE SULFATE (PF) 4 MG/ML IV SOLN
1.0000 mg | INTRAVENOUS | Status: DC | PRN
  Administered 2017-09-19 – 2017-09-23 (×9): 2 mg via INTRAVENOUS
  Administered 2017-09-24 – 2017-09-25 (×2): 1 mg via INTRAVENOUS
  Filled 2017-09-19 (×11): qty 1

## 2017-09-19 NOTE — Progress Notes (Signed)
PT Cancellation Note  Patient Details Name: [name] MRN: [id number] DOB: March 28, 1946   Cancelled Treatment:    Reason Eval/Treat Not Completed: Patient not medically ready; Troponin remains 0.10 today, cardiology consult pending, BP soft, will continue efforts   [facility] 09/19/2017, 9:29 AM

## 2017-09-19 NOTE — Plan of Care (Signed)
  Problem: Pain Managment: Goal: General experience of comfort will improve Outcome: Not Progressing   Problem: Skin Integrity: Goal: Risk for impaired skin integrity will decrease Outcome: Not Progressing   Problem: Safety: Goal: Ability to remain free from injury will improve Outcome: Progressing

## 2017-09-19 NOTE — Telephone Encounter (Signed)
  This encounter was created in error - please disregard.

## 2017-09-19 NOTE — Progress Notes (Signed)
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  Strict intake and output and use ted hoses or compression stockings.  Monitor k and creatinine on lv lasix.  Cardiology consulted and recommendations given.  Echocardiogram ordered.    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    Chronic atrial fibrillation  Rate controlled on xarelto.    H/o nephrotic syndrome   H/o orthostatic hypotension on midodrine    Severe protein calorie malnutrition:  Nutrition consulted.    Chronic pain :  Unclear etiology. Will probably need referral to pain clinic on discharge.    H/o COPD: No wheezing heard.    Elevated troponin: possibly from demand ischemia from CHF.   H/o prostate cancer:  Follow up with oncology as recommended.      DVT prophylaxis: lovenox.  Code Status: full code.  Family Communication: wife at bedside.  Disposition Plan: pending resolution of respiratory symptoms.   Consultants:   cardology  Wound care consult.    Procedures: echocardiogram.   Antimicrobials: none.   Subjective: Reports generalized body pains and requesting pain medications.   Objective: Vitals:   09/19/17 1000 09/19/17 1100 09/19/17 1200 09/19/17 1300  BP: 91/63 92/64 100/61 (!) 110/58  Pulse:  64 72 72  Resp: 16 (!) 21 12 (!) 8  Temp:   (!) 97.5 F (36.4 C)   TempSrc:   Axillary   SpO2:  92% 99%  100%  Weight:      Height:        Intake/Output Summary (Last 24 hours) at 09/19/2017 1502 Last data filed at 09/19/2017 1100 Gross per 24 hour  Intake -  Output 850 ml  Net -850 ml   Filed Weights   09/18/17 1636 09/19/17 0500  Weight: 69.9 kg (154 lb) 71.9 kg (158 lb 8.2 oz)    Examination:  General exam: Appears calm on [place] oxygen.  Respiratory system: decreased breath sounds at bases, scattered rales.  Cardiovascular system: S1 & S2 heard, irregular, No JVD,3 + pedal edema.  Gastrointestinal system: Abdomen is nondistended, soft and nontender. No organomegaly or masses felt. Normal bowel sounds heard. Central nervous system: Alert and oriented. Grossly non focal.  Extremities: pedal edema.  Skin: deep tissue injury over the sacral area Psychiatry:Mood & affect appropriate.     Data Reviewed: I have personally reviewed following labs and imaging studies  CBC: Recent Labs  Lab 09/18/17 1718 09/19/17 0151  WBC 10.3 9.1  NEUTROABS 9.7* 8.4*  HGB 9.6* 9.8*  HCT 28.8* 29.7*  MCV 101.1* 102.4*  PLT 345 709   Basic Metabolic Panel: Recent Labs  Lab 09/18/17 1718 09/19/17 0151  NA 136 137  K 3.5 3.9  CL 100* 101  CO2 26 26  GLUCOSE 87 85  BUN 28* 31*  CREATININE 0.77 0.90  CALCIUM 7.8* 8.0*   GFR: Estimated Creatinine Clearance: 67.3 mL/min (by C-G formula based on SCr of 0.9 mg/dL). Liver Function  Tests: Recent Labs  Lab 09/18/17 1718  AST 15  ALT 10*  ALKPHOS 273*  BILITOT 0.6  PROT 4.4*  ALBUMIN 1.6*   No results for input(s): LIPASE, AMYLASE in the last 168 hours. No results for input(s): AMMONIA in the last 168 hours. Coagulation Profile: Recent Labs  Lab 09/18/17 1718  INR 2.01   Cardiac Enzymes: Recent Labs  Lab 09/18/17 1718 09/18/17 1951 09/19/17 0151 09/19/17 0834  TROPONINI 0.10* 0.10* 0.10* 0.09*   BNP (last 3 results) No results for input(s): PROBNP in the last 8760 hours. HbA1C: No results for input(s): HGBA1C in the  last 72 hours. CBG: No results for input(s): GLUCAP in the last 168 hours. Lipid Profile: No results for input(s): CHOL, HDL, LDLCALC, TRIG, CHOLHDL, LDLDIRECT in the last 72 hours. Thyroid Function Tests: No results for input(s): TSH, T4TOTAL, FREET4, T3FREE, THYROIDAB in the last 72 hours. Anemia Panel: Recent Labs    09/19/17 0151  RETICCTPCT 1.4   Sepsis Labs: Recent Labs  Lab 09/18/17 1725  LATICACIDVEN 0.69    Recent Results (from the past 240 hour(s))  MRSA PCR Screening     Status: None   Collection Time: 09/18/17 10:25 PM  Result Value Ref Range Status   MRSA by PCR NEGATIVE NEGATIVE Final    Comment:        The GeneXpert MRSA Assay (FDA approved for NASAL specimens only), is one component of a comprehensive MRSA colonization surveillance program. It is not intended to diagnose MRSA infection nor to guide or monitor treatment for MRSA infections. Performed at [facility], [place] [address], [place], [place] [address]          Radiology Studies: Dg Chest Portable 1 View  Result Date: 09/18/2017 CLINICAL DATA:  Weakness.  Edema in the feet and hands. EXAM: PORTABLE CHEST 1 VIEW COMPARISON:  05/09/2017 and older exams. FINDINGS: Moderate enlargement of the cardiopericardial silhouette. Bilateral pleural effusions. Bilateral interstitial thickening and hazy central and lung base airspace opacities. No pneumothorax. Stable calcified right paratracheal lymph nodes. Right anterior chest wall Port-A-Cath, new from the prior exam. Catheter tip projects in the right atrium. IMPRESSION: 1. Findings are consistent with congestive heart failure with interstitial and hazy airspace pulmonary edema and bilateral pleural effusions. Electronically Signed   By: Lajean Manes M.D.   On: 09/18/2017 17:38        Scheduled Meds: . acidophilus  1 capsule Oral Daily  . acyclovir  400 mg Oral Daily  . Chlorhexidine Gluconate Cloth  6 each Topical Daily  .  furosemide  40 mg Intravenous BID  . miconazole nitrate   Topical BID  . midodrine  10 mg Oral TID WC  . potassium chloride SA  20 mEq Oral BID  . pregabalin  25 mg Oral BID  . rivaroxaban  20 mg Oral Q supper  . senna  1 tablet Oral Daily  . sodium chloride flush  10-40 mL Intracatheter Q12H  . sodium chloride flush  3 mL Intravenous Q12H   Continuous Infusions: . sodium chloride       LOS: 1 day    Time spent: 35 mintues.     Hosie Poisson, MD [facility] Pager [phone]   If 7PM-7AM, please contact night-coverage [email or web address] Password [email or web address] 09/19/2017, 3:02 PM

## 2017-09-19 NOTE — Progress Notes (Signed)
  Echocardiogram 2D Echocardiogram has been performed.  Merrie Roof F 09/19/2017, 12:29 PM

## 2017-09-19 NOTE — Consult Note (Addendum)
Cardiology Consultation:   Patient ID: [name]; [id number]; May 07, 1946   Admit date: 09/18/2017 Date of Consult: 09/19/2017  Primary Care Provider: Biagio Borg, MD Primary Cardiologist: Dr Aundra Dubin, 04/07/2017 Primary Electrophysiologist:  n/a   Patient Profile:   [name] is a 72 y.o. 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  History of Present Illness:   [name] tries to weigh daily.  However, with his increasing weakness he has not always been successful.  His wife keeps track of everything.  He does not remember what his weights are, but remembers his wife saying they were not changing much.  He was seen by Dr. Jenny Reichmann 09/01/2017 and his volume status was good, systolic blood pressure in the 90s, weight 154 pounds.  He has had increasing lower extremity and pedal edema for weeks.  Per phone note on 09/16/2017, home health was asking to continue compression wraps 2 times a week.  5/8, phone notes regarding decreased level of consciousness as he was drowsy more so than usual.  His wife was aware something was wrong but there was no specific complaint other than drowsiness and weakness.  He would have been seen in the office for this, but was so weak she did not think she could get them there.  Came to the ER 09/18/2017 and chest x-ray showed pulmonary edema.  He was started on IV Lasix and cardiology was asked to evaluate him.  [name] is currently sitting propped up in bed.  He states his breathing has improved.  He is awake and alert and able to answer questions.  He has been very weak and struggling to do even simple things like walk with assistance.  He is not sure if his legs are bigger  today than they were last week or not.  He just knows that the edema has been bad for a while.  He has severe pain all over.  Not only does he have pain when moving around, but his skin is extremely sensitive and he reacts to the lightest touch.  He is not aware of the atrial fibrillation.  He has not had chest pain.   Past Medical History:  Diagnosis Date  . Anxiety   . Arthritis    "neck" (08/15/2015)  . Chronic bronchitis ([name])   . Chronic diastolic CHF (congestive heart failure) ([place])   . Constipation   . COPD (chronic obstructive pulmonary disease) ([place])   . DDD (degenerative disc disease), cervical   . Depression   . Dilated aortic root ([place])    91m on echo 05/2016  . Edema of left lower extremity   . Facial basal cell cancer   . H/O acne vulgaris 1960s   [facility]  . Headache    history of - left temporal- years ago- not current (08/15/2015)  . Persistent atrial fibrillation (HCC)    on coumadin with CHADS2VASC score of 2  . Pneumonia 1960s; 2015 X 2  . Prostate cancer [place] early 2000[place]   "low spreading; non aggressive type" (08/15/2015)  . Pulmonary HTN ([place]    PASP 485mg by echo 05/2016  . Skin cancer    "back"  Past Surgical History:  Procedure Laterality Date  . CARDIOVERSION N/A 10/11/2016   Procedure: CARDIOVERSION;  Surgeon: Sanda Klein, MD;  Location: [facility] ENDOSCOPY;  Service: Cardiovascular;  Laterality: N/A;  . CARDIOVERSION N/A 02/26/2017   Procedure: CARDIOVERSION;  Surgeon: [name], MD;  Location: [facility] ENDOSCOPY;  Service: Cardiovascular;  Laterality: N/A;  . COLONOSCOPY    . EXCISIONAL HEMORRHOIDECTOMY  1960s  . INGUINAL HERNIA REPAIR Right 2002  . INGUINAL HERNIA REPAIR Bilateral 08/15/2015  . INGUINAL HERNIA REPAIR Bilateral 08/15/2015   Procedure: OPEN REPAIR RECURRENT RIGHT INGUINAL HERNIA WITH MESH AND REPAIR LEFT INGUINAL HERNIA WITH MESH;  Surgeon: [name], MD;  Location: [facility];   Service: General;  Laterality: Bilateral;  . INSERTION OF MESH Bilateral 08/15/2015   Procedure: INSERTION OF MESH;  Surgeon: [name], MD;  Location: [facility];  Service: General;  Laterality: Bilateral;  . IR FLUORO GUIDE PORT INSERTION RIGHT  06/20/2017  . IR THORACENTESIS ASP PLEURAL SPACE W/IMG GUIDE  11/18/2016  . IR US GUIDE VASC ACCESS RIGHT  06/20/2017  . LYMPH NODE BIOPSY Bilateral 10/22/2016   Procedure: LEFT NECK LYMPH NODE BIOPSY;  Surgeon: [name], MD;  Location: [facility];  Service: Thoracic;  Laterality: Bilateral;  . PROSTATE BIOPSY    . TONSILLECTOMY  1950s  . VIDEO ASSISTED THORACOSCOPY (VATS)/EMPYEMA Right 05/20/2016   Procedure: VIDEO ASSISTED THORACOSCOPY (VATS) with drainage of pleural effusion;  Surgeon: [name], MD;  Location: [facility];  Service: Thoracic;  Laterality: Right;  [name] VIDEO BRONCHOSCOPY WITH ENDOBRONCHIAL ULTRASOUND Right 05/20/2016   Procedure: VIDEO BRONCHOSCOPY WITH ENDOBRONCHIAL ULTRASOUND;  Surgeon: [name], MD;  Location: [facility];  Service: Thoracic;  Laterality: Right;     Prior to Admission medications   Medication Sig Start Date End Date Taking? Authorizing Provider  acyclovir (ZOVIRAX) 400 MG tablet Take 1 tablet (400 mg total) by mouth daily. 08/11/17  Yes Brunetta Genera, MD  furosemide (LASIX) 40 MG tablet Take 1 tablet (40 mg total) by mouth daily. 05/23/17  Yes [name], MD  HYDROcodone-acetaminophen [facility]) 10-325 MG tablet Take 1 tablet by mouth every 6 (six) hours as needed. Patient taking differently: Take 1 tablet by mouth every 6 (six) hours as needed for moderate pain.  08/05/17  Yes Biagio Borg, MD  midodrine (PROAMATINE) 10 MG tablet Take 1 tablet (10 mg total) by mouth 3 (three) times daily with meals. Patient taking differently: Take 10 mg by mouth 2 (two) times daily.  05/12/17  Yes Charlynne Cousins, MD  potassium chloride SA (K-DUR,KLOR-CON) 20 MEQ tablet Take 1 tablet (20 mEq total) by mouth 2 (two) times  daily. 07/22/17  Yes Brunetta Genera, MD  Probiotic Product (PROBIOTIC DAILY PO) Take 1 tablet by mouth daily.   Yes [provider]  XARELTO 20 MG TABS tablet TAKE 1 TABLET BY MOUTH EVERY DAY WITH SUPPER 09/05/17  Yes [name], MD  furosemide (LASIX) 40 MG tablet TAKE 1 TABLET BY MOUTH TWICE DAILY FOR 5 DAYS, THEN TAKE 1 TABLET ONCE DAILY Patient not taking: Reported on 09/18/2017 09/03/17   [name], MD  hydrOXYzine (ATARAX/VISTARIL) 10 MG tablet Take 6 tablets (60 mg total) by mouth 3 (three) times daily as needed. Patient not taking: Reported on 09/18/2017 06/16/17   Biagio Borg, MD  lidocaine (LIDODERM) 5 % Place 1 patch onto the skin daily. Remove & Discard patch within 12 hours or as directed by MD Patient not taking:  Reported on 09/18/2017 09/01/17   Biagio Borg, MD  Miconazole POWD Apply 5 g topically 2 (two) times daily. To rash in groin. Patient not taking: Reported on 09/18/2017 08/29/17   Wyatt Portela, MD  nystatin cream (MYCOSTATIN) APPLY EXTERNALLY TO THE AFFECTED AREA TWICE DAILY Patient not taking: Reported on 09/18/2017 05/19/17   Biagio Borg, MD  polycarbophil (FIBERCON) 625 MG tablet Take 1 tablet (625 mg total) by mouth daily. Patient not taking: Reported on 09/18/2017 08/21/17   Brunetta Genera, MD  prochlorperazine (COMPAZINE) 10 MG tablet Take 1 tablet (10 mg total) every 6 (six) hours as needed by mouth (Nausea or vomiting). Patient not taking: Reported on 09/18/2017 03/24/17   Brunetta Genera, MD  traZODone (DESYREL) 50 MG tablet Take 0.5-1 tablets (25-50 mg total) by mouth at bedtime as needed for sleep. Patient not taking: Reported on 09/18/2017 05/19/17   Biagio Borg, MD    Inpatient Medications: Scheduled Meds: . acidophilus  1 capsule Oral Daily  . acyclovir  400 mg Oral Daily  . Chlorhexidine Gluconate Cloth  6 each Topical Daily  . furosemide  40 mg Intravenous BID  . miconazole nitrate   Topical BID  . midodrine  10 mg Oral TID WC    . potassium chloride SA  20 mEq Oral BID  . pregabalin  25 mg Oral BID  . rivaroxaban  20 mg Oral Q supper  . senna  1 tablet Oral Daily  . sodium chloride flush  10-40 mL Intracatheter Q12H  . sodium chloride flush  3 mL Intravenous Q12H   Continuous Infusions: . sodium chloride     PRN Meds: sodium chloride, acetaminophen, fentaNYL (SUBLIMAZE) injection, HYDROcodone-acetaminophen, ondansetron (ZOFRAN) IV, sodium chloride flush, sodium chloride flush  Allergies:    Allergies  Allergen Reactions  . Demerol [Meperidine] Other (See Comments)    UNSPECIFIED REACTION  Causes system to shutdown. ?   . Oxycodone Hcl Other (See Comments)    Pt states this medication 'wires him up' and makes pt hyper; pt does not want to take this ever again    Social History:   Social History   Socioeconomic History  . Marital status: Married    Spouse name: Not on file  . Number of children: Not on file  . Years of education: Not on file  . Highest education level: Not on file  Occupational History  . Occupation: Retired  Scientific laboratory technician  . Financial resource strain: Not on file  . Food insecurity:    Worry: Not on file    Inability: Not on file  . Transportation needs:    Medical: Not on file    Non-medical: Not on file  Tobacco Use  . Smoking status: Former Smoker    Packs/day: 1.00    Years: 62.00    Pack years: 62.00    Types: Cigarettes    Last attempt to quit: 05/14/2016    Years since quitting: 1.3  . Smokeless tobacco: Never Used  Substance and Sexual Activity  . Alcohol use: No    Alcohol/week: 0.0 oz  . Drug use: No    Comment: CBD OIL  . Sexual activity: Not Currently    Partners: Female  Lifestyle  . Physical activity:    Days per week: Not on file    Minutes per session: Not on file  . Stress: Not on file  Relationships  . Social connections:    Talks on phone: Not on file  Gets together: Not on file    Attends religious service: Not on file    Active member  of club or organization: Not on file    Attends meetings of clubs or organizations: Not on file    Relationship status: Not on file  . Intimate partner violence:    Fear of current or ex partner: Not on file    Emotionally abused: Not on file    Physically abused: Not on file    Forced sexual activity: Not on file  Other Topics Concern  . Not on file  Social History Narrative   Patient lives with his wife who is a great deal of help in his care    Family History:   Family History  Problem Relation Age of Onset  . Colon cancer Mother   . Ovarian cancer Mother   . Alcoholism Father   . CAD Father   . Alcohol abuse Father   . Ovarian cancer Sister   . Diabetes Neg Hx   . Stomach cancer Neg Hx    Family Status:  Family Status  Relation Name Status  . Mother  Deceased  . Father  Deceased  . Sister  Deceased  . MGM  Deceased  . MGF  Deceased  . PGM  Deceased  . PGF  Deceased  . Neg Hx  (Not Specified)    ROS:  Please see the history of present illness.  All other ROS reviewed and negative.     Physical Exam/Data:   Vitals:   09/19/17 0351 09/19/17 0400 09/19/17 0500 09/19/17 0801  BP:  103/70 98/64   Pulse:  83 88   Resp:  14 (!) 22   Temp: 97.6 F (36.4 C)   (!) 97.4 F (36.3 C)  TempSrc: Axillary   Oral  SpO2:  96% 97%   Weight:   158 lb 8.2 oz (71.9 kg)   Height:        Intake/Output Summary (Last 24 hours) at 09/19/2017 1155 Last data filed at 09/19/2017 0501 Gross per 24 hour  Intake -  Output 525 ml  Net -525 ml   Filed Weights   09/18/17 1636 09/19/17 0500  Weight: 154 lb (69.9 kg) 158 lb 8.2 oz (71.9 kg)   Body mass index is 24.83 kg/m.  General:  Well nourished, well developed, in no acute distress HEENT: normal Lymph: no adenopathy Neck: JVD difficult to evaluate because of indwelling catheter, is elevated Endocrine:  No thryomegaly Vascular: No carotid bruits; upper extremity pulses 2+, not able to palpate lower extremity pulses due to  edema Cardiac:  normal S1, S2; irregular rate and rhythm; no murmur  Lungs: Decreased breath sounds bases with Rales bilaterally, no wheezing, rhonchi   Abd: soft, tender, no hepatomegaly  Ext: 2-3+ pedal and lower extremity edema  Musculoskeletal:  No deformities, BUE and BLE strength extremely weak but equal Skin: warm and dry  Neuro:  CNs 2-12 intact, no focal abnormalities noted Psych:  Normal affect   EKG:  The EKG was personally reviewed and demonstrates: 5/9 ECG atrial fibrillation, heart rate 72, right bundle branch block is old, lateral ST/T wave changes are old; QT/QTc checked by me and not significantly changed from 02/26/2017 ECG Telemetry:  Telemetry was personally reviewed and demonstrates: Atrial fib, rate generally controlled  Relevant CV Studies:  ECHO: Ordered for today ECHO: 05/01/2017 - Left ventricle: The cavity size was normal. There was moderate   focal basal hypertrophy of the septum. Systolic function  was   normal. The estimated ejection fraction was in the range of 55%   to 60%. Wall motion was normal; there were no regional wall   motion abnormalities. - Aortic valve: Transvalvular velocity was within the normal range.   There was no stenosis. There was no regurgitation. - Mitral valve: Mildly calcified annulus. Transvalvular velocity   was within the normal range. There was no evidence for stenosis.   There was trivial regurgitation. - Left atrium: The atrium was severely dilated. - Right ventricle: The cavity size was normal. Wall thickness was   normal. Systolic function was normal. - Atrial septum: There appears to be a patent foramen ovale with   left to right flow at rest. - Tricuspid valve: There was mild regurgitation. - Pulmonary arteries: Systolic pressure was within the normal   range. PA peak pressure: 27 mm Hg (S).  MYOVIEW: 08/08/2016   Nuclear stress EF: 58%.  The study is normal.  The left ventricular ejection fraction is normal  (55-65%).  This is a low risk study.  CATH: None  Laboratory Data:  Chemistry Recent Labs  Lab 09/18/17 1718 09/19/17 0151  NA 136 137  K 3.5 3.9  CL 100* 101  CO2 26 26  GLUCOSE 87 85  BUN 28* 31*  CREATININE 0.77 0.90  CALCIUM 7.8* 8.0*  GFRNONAA >60 >60  GFRAA >60 >60  ANIONGAP 10 10    Lab Results  Component Value Date   ALT 10 (L) 09/18/2017   AST 15 09/18/2017   ALKPHOS 273 (H) 09/18/2017   BILITOT 0.6 09/18/2017   Hematology Recent Labs  Lab 09/18/17 1718 09/19/17 0151  WBC 10.3 9.1  RBC 2.85* 2.90*  HGB 9.6* 9.8*  HCT 28.8* 29.7*  MCV 101.1* 102.4*  MCH 33.7 33.8  MCHC 33.3 33.0  RDW 15.8* 15.3  PLT 345 296   Cardiac Enzymes Recent Labs  Lab 09/18/17 1718 09/18/17 1951 09/19/17 0151 09/19/17 0834  TROPONINI 0.10* 0.10* 0.10* 0.09*     BNP Recent Labs  Lab 09/18/17 1718  BNP 1,924.4*    TSH:  Lab Results  Component Value Date   TSH 2.489 02/22/2017   Lipids: Lab Results  Component Value Date   CHOL 115 02/24/2017   HDL 39 (L) 02/24/2017   LDLCALC 66 02/24/2017   TRIG 51 02/24/2017   CHOLHDL 2.9 02/24/2017   HgbA1c:No results found for: HGBA1C Magnesium:  Magnesium  Date Value Ref Range Status  05/11/2017 1.9 1.7 - 2.4 mg/dL Final  04/24/2017 1.6 1.5 - 2.5 mg/dl Final     Radiology/Studies:  Dg Chest Portable 1 View  Result Date: 09/18/2017 CLINICAL DATA:  Weakness.  Edema in the feet and hands. EXAM: PORTABLE CHEST 1 VIEW COMPARISON:  05/09/2017 and older exams. FINDINGS: Moderate enlargement of the cardiopericardial silhouette. Bilateral pleural effusions. Bilateral interstitial thickening and hazy central and lung base airspace opacities. No pneumothorax. Stable calcified right paratracheal lymph nodes. Right anterior chest wall Port-A-Cath, new from the prior exam. Catheter tip projects in the right atrium. IMPRESSION: 1. Findings are consistent with congestive heart failure with interstitial and hazy airspace pulmonary  edema and bilateral pleural effusions. Electronically Signed   By: Lajean Manes M.D.   On: 09/18/2017 17:38    Assessment and Plan:   1.  Acute on chronic diastolic CHF: -Weight is 947 pounds today, not much above his baseline. -However, his upper body looks almost cachectic, suspect he has lost muscle mass and added fluid -Agree with diuresis,  continue Lasix 40 mg IV twice daily -However, his albumin is 1.6 and so he is likely third spacing -He would benefit from compression stockings, or Ace wraps on his legs however, he may not be able to tolerate the pain from them -Follow renal function closely with diuresis  2.  Atrial fibrillation: -He is not requiring rate control medications  3.  Orthostatic hypotension: -Continue midodrine  4.  Elevated troponin - Agree with echo. - However, feel the elevation is more consistent with CHF than ACS - If EF is normal, doubt ischemic eval needed  Otherwise, per IM Principal Problem:   Diastolic CHF ([place]) Active Problems:   COPD mixed type (HCC)   Hypotension   Malignant lymphoplasmacytic lymphoma (HCC)   Elevated troponin   Tinea cruris   Generalized weakness     For questions or updates, please contact [facility] Please consult [email or web address] for contact info under Cardiology/STEMI.   Signed, Rosaria Ferries, PA-C  09/19/2017 11:55 AM   I have seen and examined the patient along with Rosaria Ferries, PA-C .  I have reviewed the chart, notes and new data.  I agree with PA's note.  Key new complaints: Unfortunately he has generalized bone pain.  He was able to tolerate most, but not all of the echo images acquisition. Key examination changes: Cachectic who with lower extremity soft pitting edema, 3-4+.  Lungs now sound clear.  Jugular veins are not distended. Key new findings / data: I reviewed his echo images.  In addition to moderate LVH there is also clear thickening of the right ventricular wall and the overall echo  reflectivity is quite enhanced.  Diastolic tissue Doppler velocities of the mitral annulus are severely reduced.  Although nondiagnostic, the images are highly compatible with cardiac amyloidosis.  Unfortunately, with atrial fibrillation it is difficult to evaluate diastolic filling pressures, but even accounting for this, I believe there is a restrictive pattern.  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 When compared with his echo images from January 2018 in July 2018 there is clear worsening of left and right ventricular wall thickness and reduction and diastolic tissue Doppler velocities. Such accelerated progression of cardiac amyloidosis is typical of [place] amyloidosis. Continue diuresis.  Creatinine levels are still normal, BUN is slightly elevated. Mild elevation in cardiac troponin I has a "plateau" pattern and is not consistent with acute coronary syndrome, but does portend poor prognosis. 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 Unfortunately, unless his hematological malignancy can be placed in remission with marked reduction in paraprotein synthesis, his prognosis is grim.  Sanda Klein, MD, [place] [phone] 09/19/2017, 1:28 PM

## 2017-09-19 NOTE — Consult Note (Signed)
[facility] Nurse wound consult note Reason for Consult: skin tears to back Wound type: DTI to lower thoracic spine with scattered abrasions various places on the back Pressure Injury POA: Yes Measurement: The DTI measures 2 cm x 2 cm and is purple, non-blanchable in color.  Various, small, scattered abrasions/skin tears are also present.  The patient is having chronic pain with movement and also with a foam dressing to the spinal areas.  The primary RN explained the patient asked her to remove the foam dressing around 12:30 today because it was painful to him.  The patient explained that the warming pack that had been placed relieved some of the pain. Wound bed: The scattered abrasion/skin tear areas are clean, pink. Drainage (amount, consistency, odor) No drainage, no odor Periwound: Fragile, thin, with ecchymotic areas. Dressing procedure/placement/frequency: Cover skin abrasions/tears with either Xeroform gauze or vaseline gauze and a telfa pad.  LIGHTLY tape in place.  Change every other day. The patient will also need frequent turns to right or left, and minimize the amount of time he is in a supine position. Monitor the wound area(s) for worsening of condition such as: Signs/symptoms of infection,  Increase in size,  Development of or worsening of odor, Development of pain, or increased pain at the affected locations.  Notify the medical team if any of these develop.  Thank you for the consult.  Discussed plan of care with the patient and bedside nurse.  [facility] nurse will not follow at this time.  Please re-consult the [facility] team if needed.  Val Riles, RN, MSN, CWOCN, CNS-BC, pager [phone]

## 2017-09-19 NOTE — Progress Notes (Signed)
09/19/17  1300  Patient has skin tears along his spine. Erin AD and I turned patient because he was c/o pain from the foam dressing. Patient asked Junie Panning to remove the dressing. Junie Panning explained to the patient that the pink foam dressing was a bearer to help keep the skin from more damage with him being in bed. Patient states he understood but still wanted dressing removed d/t pain.  WOC RN recommended Xeroform and Telfa for pt's spine. Xeroform and telfa applied to spine.

## 2017-09-20 DIAGNOSIS — E854 Organ-limited amyloidosis: Secondary | ICD-10-CM

## 2017-09-20 DIAGNOSIS — I481 Persistent atrial fibrillation: Secondary | ICD-10-CM

## 2017-09-20 DIAGNOSIS — I43 Cardiomyopathy in diseases classified elsewhere: Secondary | ICD-10-CM

## 2017-09-20 LAB — BASIC METABOLIC PANEL
ANION GAP: 11 (ref 5–15)
BUN: 33 mg/dL — AB (ref 6–20)
CALCIUM: 8.1 mg/dL — AB (ref 8.9–10.3)
CO2: 27 mmol/L (ref 22–32)
CREATININE: 0.96 mg/dL (ref 0.61–1.24)
Chloride: 97 mmol/L — ABNORMAL LOW (ref 101–111)
GFR calc Af Amer: >60 mL/min (ref >60)
GFR calc non Af Amer: >60 mL/min (ref >60)
GLUCOSE: 73 mg/dL (ref 65–99)
Potassium: 4.5 mmol/L (ref 3.5–5.1)
Sodium: 135 mmol/L (ref 135–145)

## 2017-09-20 MED ORDER — FOLIC ACID 1 MG PO TABS
1.0000 mg | ORAL_TABLET | Freq: Every day | ORAL | Status: DC
  Administered 2017-09-20 – 2017-09-26 (×7): 1 mg via ORAL
  Filled 2017-09-20 (×7): qty 1

## 2017-09-20 NOTE — Progress Notes (Signed)
PROGRESS NOTE    [name]  TDV:[id number] DOB: 06/19/1945 DOA: 09/18/2017 PCP: Biagio Borg, MD    Brief Narrative:  SKYELAR [name] is a 72 y.o. male with medical history significant of diastolic CHF last EF 55 to 60%, A. fib on Xarelto, COPD, oxygen dependent on 3 L, and malignant lymphoplasmayctic lymphoma; who presents with complaints of weakness over the last 2-3 days associated with swelling of the lower extremities and hands and severe pain in the back. He had chemo about 3 weeks ago and follows up with Dr Irene Limbo. Chest x-ray showing signs of congestive heart failure with pulmonary edema and pleural effusions.       Assessment & Plan:   Principal Problem:   Diastolic CHF ([place]) Active Problems:   COPD mixed type (HCC)   Hypotension   Malignant lymphoplasmacytic lymphoma (HCC)   Elevated troponin   Tinea cruris   Generalized weakness   Acute respiratory failure with hypoxia secondary to acute on chronic diastolic heart failure:  Admitted to step down and started him on IV lasix BID, monitor for diuresis.  Strict intake and output and use ted hoses or compression stockings.  Monitor k and creatinine on lv lasix.  Cardiology consulted and recommendations given.  Echocardiogram ordered.  Urine output about 925 overnight and 1.5 lit since admission. Patient's bp is low to aggressively diurese him.     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    Chronic atrial fibrillation  Rate controlled on xarelto.    Anemia with  Iron and folate deficiency: Folate added.    H/o nephrotic syndrome Would explain the hypoalbuminemia.    H/o orthostatic hypotension on midodrine    Severe protein calorie malnutrition:  Nutrition consulted.    Chronic pain :  Unclear etiology. Will probably need referral to pain clinic on discharge.  Currently on lyrica, oxy and  morphinr.    H/o COPD: No wheezing heard.    Elevated troponin: possibly from demand ischemia from CHF.   H/o prostate cancer:  Follow up with oncology as recommended.      DVT prophylaxis: lovenox.  Code Status: full code.  Family Communication: wife at bedside.  Disposition Plan: pending resolution of respiratory symptoms. Remain in step down.   Consultants:   cardology  Wound care consult.    Procedures: echocardiogram.   Antimicrobials: none.   Subjective: Pt erports he had a rough night from back pain.   No chest pain or sob.   Objective: Vitals:   09/20/17 0359 09/20/17 0400 09/20/17 0620 09/20/17 0800  BP:  (!) 78/56 (!) 90/57 (!) 89/50  Pulse:  78 83 88  Resp:  _0 Temp: 97.6 F (36.4 C)   (!) 97.5 F (36.4 C)  TempSrc: Oral   Oral  SpO2:  100% 96% 98%  Weight:      Height:        Intake/Output Summary (Last 24 hours) at 09/20/2017 0924 Last data filed at 09/20/2017 0900 Gross per 24 hour  Intake 300 ml  Output 975 ml  Net -675 ml   Filed Weights   09/18/17 1636 09/19/17 0500  Weight: 69.9 kg (154 lb) 71.9 kg (158 lb 8.2 oz)    Examination:  General exam: Appears calm on [place] oxygen. No in distress.   Respiratory system: decreased breath sounds at bases, scattered rales.  Cardiovascular system: S1 & S2 heard, irregular, No JVD,3 + pedal edema.  Gastrointestinal system: Abdomen is soft NT  ND BS+ Central nervous system: Alert and oriented. Grossly non focal.  Extremities: pedal edema.  Skin: deep tissue injury over the sacral area Psychiatry:Mood & affect appropriate.     Data Reviewed: I have personally reviewed following labs and imaging studies  CBC: Recent Labs  Lab 09/18/17 1718 09/19/17 0151  WBC 10.3 9.1  NEUTROABS 9.7* 8.4*  HGB 9.6* 9.8*  HCT 28.8* 29.7*  MCV 101.1* 102.4*  PLT 345 815   Basic Metabolic Panel: Recent Labs  Lab 09/18/17 1718 09/19/17 0151 09/20/17 0500  NA 136 137 135  K 3.5 3.9 4.5  CL  100* 101 97*  CO2 _0 GLUCOSE 87 85 73  BUN 28* 31* 33*  CREATININE 0.77 0.90 0.96  CALCIUM 7.8* 8.0* 8.1*   GFR: Estimated Creatinine Clearance: 63.1 mL/min (by C-G formula based on SCr of 0.96 mg/dL). Liver Function Tests: Recent Labs  Lab 09/18/17 1718  AST 15  ALT 10*  ALKPHOS 273*  BILITOT 0.6  PROT 4.4*  ALBUMIN 1.6*   No results for input(s): LIPASE, AMYLASE in the last 168 hours. No results for input(s): AMMONIA in the last 168 hours. Coagulation Profile: Recent Labs  Lab 09/18/17 1718  INR 2.01   Cardiac Enzymes: Recent Labs  Lab 09/18/17 1718 09/18/17 1951 09/19/17 0151 09/19/17 0834  TROPONINI 0.10* 0.10* 0.10* 0.09*   BNP (last 3 results) No results for input(s): PROBNP in the last 8760 hours. HbA1C: No results for input(s): HGBA1C in the last 72 hours. CBG: No results for input(s): GLUCAP in the last 168 hours. Lipid Profile: No results for input(s): CHOL, HDL, LDLCALC, TRIG, CHOLHDL, LDLDIRECT in the last 72 hours. Thyroid Function Tests: No results for input(s): TSH, T4TOTAL, FREET4, T3FREE, THYROIDAB in the last 72 hours. Anemia Panel: Recent Labs    09/19/17 0151 09/19/17 1335  VITAMINB12  --  4,049*  FOLATE  --  5.9*  FERRITIN  --  915*  TIBC  --  132*  IRON  --  29*  RETICCTPCT 1.4  --    Sepsis Labs: Recent Labs  Lab 09/18/17 1725  LATICACIDVEN 0.69    Recent Results (from the past 240 hour(s))  MRSA PCR Screening     Status: None   Collection Time: 09/18/17 10:25 PM  Result Value Ref Range Status   MRSA by PCR NEGATIVE NEGATIVE Final    Comment:        The GeneXpert MRSA Assay (FDA approved for NASAL specimens only), is one component of a comprehensive MRSA colonization surveillance program. It is not intended to diagnose MRSA infection nor to guide or monitor treatment for MRSA infections. Performed at [facility], [place] [address], [place], [place] [address]          Radiology  Studies: Dg Chest Portable 1 View  Result Date: 09/18/2017 CLINICAL DATA:  Weakness.  Edema in the feet and hands. EXAM: PORTABLE CHEST 1 VIEW COMPARISON:  05/09/2017 and older exams. FINDINGS: Moderate enlargement of the cardiopericardial silhouette. Bilateral pleural effusions. Bilateral interstitial thickening and hazy central and lung base airspace opacities. No pneumothorax. Stable calcified right paratracheal lymph nodes. Right anterior chest wall Port-A-Cath, new from the prior exam. Catheter tip projects in the right atrium. IMPRESSION: 1. Findings are consistent with congestive heart failure with interstitial and hazy airspace pulmonary edema and bilateral pleural effusions. Electronically Signed   By: Lajean Manes M.D.   On: 09/18/2017 17:38  Scheduled Meds: . acidophilus  1 capsule Oral Daily  . acyclovir  400 mg Oral Daily  . Chlorhexidine Gluconate Cloth  6 each Topical Daily  . folic acid  1 mg Oral Daily  . furosemide  40 mg Intravenous BID  . miconazole nitrate   Topical BID  . midodrine  10 mg Oral TID WC  . potassium chloride SA  20 mEq Oral BID  . pregabalin  25 mg Oral BID  . rivaroxaban  20 mg Oral Q supper  . senna  1 tablet Oral Daily  . sodium chloride flush  10-40 mL Intracatheter Q12H  . sodium chloride flush  3 mL Intravenous Q12H   Continuous Infusions: . sodium chloride       LOS: 2 days    Time spent: 35 mintues.     Hosie Poisson, MD [facility] Pager [phone]   If 7PM-7AM, please contact night-coverage [email or web address] Password [facility] 09/20/2017, 9:24 AM

## 2017-09-20 NOTE — Progress Notes (Signed)
PT Cancellation Note  Patient Details Name: [name] MRN: [id number] DOB: 08-11-1945   Cancelled Treatment:    Reason Eval/Treat Not Completed: Medical issues which prohibited therapy- noted low BP. Will check back 09/22/17 for medical stability.   Marcelino Freestone PT [phone]  09/20/2017, 4:38 PM

## 2017-09-20 NOTE — Progress Notes (Signed)
Progress Note  Patient Name: [name] Date of Encounter: 09/20/2017  Primary Cardiologist: Dr. [name]  Subjective   Complaining of significant pain in his back.  Uncomfortable this morning.  No palpitations or chest pain.  Inpatient Medications    Scheduled Meds: . acidophilus  1 capsule Oral Daily  . acyclovir  400 mg Oral Daily  . Chlorhexidine Gluconate Cloth  6 each Topical Daily  . furosemide  40 mg Intravenous BID  . miconazole nitrate   Topical BID  . midodrine  10 mg Oral TID WC  . potassium chloride SA  20 mEq Oral BID  . pregabalin  25 mg Oral BID  . rivaroxaban  20 mg Oral Q supper  . senna  1 tablet Oral Daily  . sodium chloride flush  10-40 mL Intracatheter Q12H  . sodium chloride flush  3 mL Intravenous Q12H   Continuous Infusions: . sodium chloride     PRN Meds: sodium chloride, acetaminophen, fentaNYL (SUBLIMAZE) injection, HYDROcodone-acetaminophen, morphine injection, ondansetron (ZOFRAN) IV, sodium chloride flush, sodium chloride flush   Vital Signs    Vitals:   09/19/17 2354 09/20/17 0359 09/20/17 0400 09/20/17 0620  BP:   (!) 78/56 (!) 90/57  Pulse:   78 83  Resp:   14 17  Temp: 97.9 F (36.6 C) 97.6 F (36.4 C)    TempSrc: Oral Oral    SpO2:   100% 96%  Weight:      Height:        Intake/Output Summary (Last 24 hours) at 09/20/2017 0744 Last data filed at 09/20/2017 0600 Gross per 24 hour  Intake 240 ml  Output 925 ml  Net -685 ml   Filed Weights   09/18/17 1636 09/19/17 0500  Weight: 154 lb (69.9 kg) 158 lb 8.2 oz (71.9 kg)    Telemetry    Atrial fibrillation.  Personally reviewed.  ECG    Tracing from 09/19/2016 showed atrial fibrillation with right bundle branch block and diffuse nonspecific ST-T changes.  Personally reviewed.  Physical Exam   GEN:  Chronically ill-appearing male, no acute distress.   Neck: No JVD. Cardiac:  Irregularly irregular, no gallop.  Respiratory: Nonlabored. Clear to  auscultation bilaterally. GI: Soft, nontender, bowel sounds present. MS:  Soft leg edema; No deformity.  Muscle wasting. Neuro:  Nonfocal. Psych: Alert and oriented x 3. Normal affect.  Labs    Chemistry Recent Labs  Lab 09/18/17 1718 09/19/17 0151  NA 136 137  K 3.5 3.9  CL 100* 101  CO2 26 26  GLUCOSE 87 85  BUN 28* 31*  CREATININE 0.77 0.90  CALCIUM 7.8* 8.0*  PROT 4.4*  --   ALBUMIN 1.6*  --   AST 15  --   ALT 10*  --   ALKPHOS 273*  --   BILITOT 0.6  --   GFRNONAA >60 >60  GFRAA >60 >60  ANIONGAP 10 10     Hematology Recent Labs  Lab 09/18/17 1718 09/19/17 0151  WBC 10.3 9.1  RBC 2.85* 2.90*  2.89*  HGB 9.6* 9.8*  HCT 28.8* 29.7*  MCV 101.1* 102.4*  MCH 33.7 33.8  MCHC 33.3 33.0  RDW 15.8* 15.3  PLT 345 296    Cardiac Enzymes Recent Labs  Lab 09/18/17 1718 09/18/17 1951 09/19/17 0151 09/19/17 0834  TROPONINI 0.10* 0.10* 0.10* 0.09*   No results for input(s): TROPIPOC in the last 168 hours.   BNP Recent Labs  Lab 09/18/17 1718  BNP 1,924.4*  Radiology    Dg Chest Portable 1 View  Result Date: 09/18/2017 CLINICAL DATA:  Weakness.  Edema in the feet and hands. EXAM: PORTABLE CHEST 1 VIEW COMPARISON:  05/09/2017 and older exams. FINDINGS: Moderate enlargement of the cardiopericardial silhouette. Bilateral pleural effusions. Bilateral interstitial thickening and hazy central and lung base airspace opacities. No pneumothorax. Stable calcified right paratracheal lymph nodes. Right anterior chest wall Port-A-Cath, new from the prior exam. Catheter tip projects in the right atrium. IMPRESSION: 1. Findings are consistent with congestive heart failure with interstitial and hazy airspace pulmonary edema and bilateral pleural effusions. Electronically Signed   By: Lajean Manes M.D.   On: 09/18/2017 17:38    Cardiac Studies   Echocardiogram 09/19/2017: Study Conclusions  - Procedure narrative: The echo was ended before completing the   protocol  due to the patients pain level due to chemo and   radiation. - Left ventricle: The cavity size was normal. Wall thickness was   increased in a pattern of moderate LVH. Systolic function was   normal. The estimated ejection fraction was in the range of 60%   to 65%. Wall motion was normal; there were no regional wall   motion abnormalities. The study is not technically sufficient to   allow evaluation of LV diastolic function. - Mitral valve: Calcified annulus. Mildly thickened leaflets .   There was mild regurgitation. - Left atrium: Severely dilated. - Tricuspid valve: Mildly thickened leaflets. There was trivial   regurgitation.  Impressions:  - Incomplete study -discontinued due to patient discomfort. LVEF   60-65%, moderate LVH, mild MR, severe LAE, apperas in a-fib.   There is a prominent left pleural effusion.  Patient Profile     72 y.o. 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  Now presenting with weakness and leg swelling, diagnosed with acute on chronic diastolic heart failure  Assessment & Plan    1.  Acute on chronic diastolic heart failure.  Net output more than intake of approximately 700 cc last 24 hours on IV Lasix.  Also on potassium supplements.  Renal function and potassium are stable.  2.  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  Follow-up echocardiogram shows LVEF 60 to 65% range.  Medical therapy is limited by hypotension.  3.  Persistent atrial fibrillation, on Xarelto for stroke prophylaxis.  4.  Orthostatic hypotension in the setting of probably neuropathy.  Currently on midodrine.  Reviewed consultation note from Dr. Sallyanne Kuster yesterday.  Continue IV diuresis as tolerated.  Heart  rate control in atrial fibrillation is adequate at this time and hypotension limits addition of other medications.  Continue supportive measures for other comorbidities and keep Oncology service aware of patient's status.  Overall prognosis is poor.  Signed,  Rozann Lesches, MD  09/20/2017, 7:44 AM

## 2017-09-21 ENCOUNTER — Inpatient Hospital Stay (HOSPITAL_COMMUNITY): Payer: Medicare Other

## 2017-09-21 LAB — CBC WITH DIFFERENTIAL/PLATELET
BASOS ABS: 0 10*3/uL (ref 0.0–0.1)
BASOS PCT: 0 %
EOS ABS: 0 10*3/uL (ref 0.0–0.7)
EOS PCT: 0 %
HCT: 30.8 % — ABNORMAL LOW (ref 39.0–52.0)
HEMOGLOBIN: 10 g/dL — AB (ref 13.0–17.0)
Lymphocytes Relative: 2 %
Lymphs Abs: 0.1 10*3/uL — ABNORMAL LOW (ref 0.7–4.0)
MCH: 33.6 pg (ref 26.0–34.0)
MCHC: 32.5 g/dL (ref 30.0–36.0)
MCV: 103.4 fL — ABNORMAL HIGH (ref 78.0–100.0)
Monocytes Absolute: 0.2 10*3/uL (ref 0.1–1.0)
Monocytes Relative: 4 %
NEUTROS PCT: 94 %
Neutro Abs: 6 10*3/uL (ref 1.7–7.7)
PLATELETS: 304 10*3/uL (ref 150–400)
RBC: 2.98 MIL/uL — AB (ref 4.22–5.81)
RDW: 15.5 % (ref 11.5–15.5)
WBC: 6.4 10*3/uL (ref 4.0–10.5)

## 2017-09-21 LAB — BASIC METABOLIC PANEL
Anion gap: 10 (ref 5–15)
BUN: 37 mg/dL — AB (ref 6–20)
CALCIUM: 8 mg/dL — AB (ref 8.9–10.3)
CHLORIDE: 100 mmol/L — AB (ref 101–111)
CO2: 28 mmol/L (ref 22–32)
CREATININE: 1.1 mg/dL (ref 0.61–1.24)
GFR calc non Af Amer: >60 mL/min (ref >60)
Glucose, Bld: 116 mg/dL — ABNORMAL HIGH (ref 65–99)
Potassium: 4.6 mmol/L (ref 3.5–5.1)
SODIUM: 138 mmol/L (ref 135–145)

## 2017-09-21 MED ORDER — ENSURE ENLIVE PO LIQD
237.0000 mL | ORAL | Status: DC
  Administered 2017-09-22 – 2017-09-26 (×4): 237 mL via ORAL

## 2017-09-21 MED ORDER — BOOST / RESOURCE BREEZE PO LIQD CUSTOM
1.0000 | Freq: Three times a day (TID) | ORAL | Status: DC
  Administered 2017-09-22 – 2017-09-26 (×12): 1 via ORAL

## 2017-09-21 MED ORDER — AMOXICILLIN-POT CLAVULANATE 875-125 MG PO TABS
1.0000 | ORAL_TABLET | Freq: Two times a day (BID) | ORAL | Status: DC
  Administered 2017-09-21 – 2017-09-26 (×12): 1 via ORAL
  Filled 2017-09-21 (×12): qty 1

## 2017-09-21 MED ORDER — GUAIFENESIN-DM 100-10 MG/5ML PO SYRP: 5.0000 mL | ORAL_SOLUTION | ORAL | Status: DC | PRN

## 2017-09-21 NOTE — Progress Notes (Signed)
Progress Note  Patient Name: [name] Date of Encounter: 09/21/2017  Primary Cardiologist: Dr. [name]  Subjective   Appears more comfortable today.  Reports less back pain.  Breathing is stable, nonlabored at rest.  No palpitations or chest pain.  Inpatient Medications    Scheduled Meds: . acidophilus  1 capsule Oral Daily  . acyclovir  400 mg Oral Daily  . Chlorhexidine Gluconate Cloth  6 each Topical Daily  . folic acid  1 mg Oral Daily  . furosemide  40 mg Intravenous BID  . miconazole nitrate   Topical BID  . midodrine  10 mg Oral TID WC  . potassium chloride SA  20 mEq Oral BID  . pregabalin  25 mg Oral BID  . rivaroxaban  20 mg Oral Q supper  . senna  1 tablet Oral Daily  . sodium chloride flush  10-40 mL Intracatheter Q12H  . sodium chloride flush  3 mL Intravenous Q12H   Continuous Infusions: . sodium chloride     PRN Meds: sodium chloride, acetaminophen, fentaNYL (SUBLIMAZE) injection, HYDROcodone-acetaminophen, morphine injection, ondansetron (ZOFRAN) IV, sodium chloride flush, sodium chloride flush   Vital Signs    Vitals:   09/21/17 0451 09/21/17 0500 09/21/17 0600 09/21/17 0700  BP:   (!) 81/39   Pulse:  60 77 77  Resp:  _0 Temp:      TempSrc:      SpO2:  93% 95% 94%  Weight: 163 lb 2.3 oz (74 kg)     Height:        Intake/Output Summary (Last 24 hours) at 09/21/2017 0722 Last data filed at 09/21/2017 0500 Gross per 24 hour  Intake 633 ml  Output 2050 ml  Net -1417 ml   Filed Weights   09/19/17 0500 09/20/17 1000 09/21/17 0451  Weight: 158 lb 8.2 oz (71.9 kg) 161 lb 2.5 oz (73.1 kg) 163 lb 2.3 oz (74 kg)    Telemetry    Atrial fibrillation.  Personally reviewed.  ECG    Tracing from 09/19/2017 shows atrial fibrillation with right bundle branch block.  Personally reviewed.  Physical Exam   GEN:  Chronically ill-appearing male, no acute distress.   Neck: No JVD. Cardiac:  Irregularly irregular, no gallop.    Respiratory: Nonlabored. Clear to auscultation bilaterally. GI: Soft, nontender, bowel sounds present. MS:  Soft 1-2+ lower leg edema; muscle wasting. Neuro:  Nonfocal. Psych: Alert and oriented x 3. Normal affect.  Labs    Chemistry Recent Labs  Lab 09/18/17 1718 09/19/17 0151 09/20/17 0500 09/21/17 0333  NA 136 137 135 138  K 3.5 3.9 4.5 4.6  CL 100* 101 97* 100*  CO2 _1 GLUCOSE 87 85 73 116*  BUN 28* 31* 33* 37*  CREATININE 0.77 0.90 0.96 1.10  CALCIUM 7.8* 8.0* 8.1* 8.0*  PROT 4.4*  --   --   --   ALBUMIN 1.6*  --   --   --   AST 15  --   --   --   ALT 10*  --   --   --   ALKPHOS 273*  --   --   --   BILITOT 0.6  --   --   --   GFRNONAA >60 >60 >60 >60  GFRAA >60 >60 >60 >60  ANIONGAP _2 Hematology Recent Labs  Lab 09/18/17 1718 09/19/17 0151  WBC 10.3 9.1  RBC  2.85* 2.90*  2.89*  HGB 9.6* 9.8*  HCT 28.8* 29.7*  MCV 101.1* 102.4*  MCH 33.7 33.8  MCHC 33.3 33.0  RDW 15.8* 15.3  PLT 345 296    Cardiac Enzymes Recent Labs  Lab 09/18/17 1718 09/18/17 1951 09/19/17 0151 09/19/17 0834  TROPONINI 0.10* 0.10* 0.10* 0.09*   No results for input(s): TROPIPOC in the last 168 hours.   BNP Recent Labs  Lab 09/18/17 1718  BNP 1,924.4*     Radiology    No results found.  Cardiac Studies   Echocardiogram 09/19/2017: Study Conclusions  - Procedure narrative: The echo was ended before completing the protocol due to the patients pain level due to chemo and radiation. - Left ventricle: The cavity size was normal. Wall thickness was increased in a pattern of moderate LVH. Systolic function was normal. The estimated ejection fraction was in the range of 60% to 65%. Wall motion was normal; there were no regional wall motion abnormalities. The study is not technically sufficient to allow evaluation of LV diastolic function. - Mitral valve: Calcified annulus. Mildly thickened leaflets . There was mild  regurgitation. - Left atrium: Severely dilated. - Tricuspid valve: Mildly thickened leaflets. There was trivial regurgitation.  Impressions:  - Incomplete study -discontinued due to patient discomfort. LVEF 60-65%, moderate LVH, mild MR, severe LAE, apperas in a-fib. There is a prominent left pleural effusion.  Patient Profile     72 y.o. 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  Now presenting with weakness and leg swelling, diagnosed with acute on chronic diastolic heart failure.  Assessment & Plan    1.  Acute on chronic diastolic heart failure.  Net output greater than intake of 1400 cc last 24 hours on IV Lasix.  Renal function stable.  2.  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  He is followed by Dr. Aundra Dubin in the heart failure clinic.  Follow-up echocardiogram shows LVEF 60 to 65%.  Medical therapy is limited by hypotension.  3.  Persistent atrial fibrillation.  Continues on Xarelto for stroke prophylaxis.  Heart rate control is reasonable, currently not on AV nodal blockers.  4.  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  Continues on midodrine.  Patient more comfortable today.  Would continue with IV diuresis, he is tolerating relatively low blood pressures.  Follow-up BMET in a.m.  Suspect he will have to transition to oral diuretics within the next 24 to 48 hours.  Continue potassium supplements.  Signed, Rozann Lesches, MD  09/21/2017, 7:22 AM

## 2017-09-21 NOTE — Progress Notes (Signed)
PROGRESS NOTE    [name]  VOZ:[id number] DOB: November 23, 1945 DOA: 09/18/2017 PCP: Biagio Borg, MD    Brief Narrative:  [name] is a 72 y.o. male with medical history significant of diastolic CHF last EF 55 to 60%, A. fib on Xarelto, COPD, oxygen dependent on 3 L, and malignant lymphoplasmayctic lymphoma; who presents with complaints of weakness over the last 2-3 days associated with swelling of the lower extremities and hands and severe pain in the back. He had chemo about 3 weeks ago and follows up with Dr Irene Limbo. Chest x-ray showing signs of congestive heart failure with pulmonary edema and pleural effusions.       Assessment & Plan:   Principal Problem:   Diastolic CHF ([place]) Active Problems:   COPD mixed type (HCC)   Hypotension   Malignant lymphoplasmacytic lymphoma (HCC)   Elevated troponin   Tinea cruris   Generalized weakness   Acute respiratory failure with hypoxia secondary to acute on chronic diastolic heart failure:  Admitted to step down and started him on IV lasix BID, monitor for diuresis.  Strict intake and output and use ted hoses or compression stockings.  Monitor k and creatinine on lv lasix.  Cardiology consulted and recommendations given.  Echocardiogram ordered and reviewed.  Urine output about 925 overnight and 2.5 lit since admission. Patient's bp is low to aggressively diurese him.  Continues to require [place] oxygen to keep sats greater than 90%.     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    Chronic atrial fibrillation  Rate controlled on xarelto.    Anemia with  Iron and folate deficiency: Folate added.    H/o nephrotic syndrome Would explain the hypoalbuminemia.    H/o orthostatic hypotension on midodrine. No change in medications.     Severe protein calorie malnutrition:  Nutrition consulted.    Chronic pain :  Unclear  etiology. Will probably need referral to pain clinic on discharge.  Currently on lyrica, oxy and morphinr.    H/o COPD: No wheezing heard.    Elevated troponin: possibly from demand ischemia from CHF.   H/o prostate cancer:  Follow up with oncology as recommended.   Pressure injury of the sacrum:  Wound care consult and recommendations given.    DVT prophylaxis: lovenox.  Code Status: full code.  Family Communication: none at bedside.  Disposition Plan: pending PT evaluation.   Consultants:   cardology  Wound care consult.    Procedures: echocardiogram.   Antimicrobials: none.   Subjective: Sleeping calmly , not in distress. no new complaints.   Objective: Vitals:   09/21/17 0600 09/21/17 0700 09/21/17 0800 09/21/17 0801  BP: (!) 81/39  (!) 88/57   Pulse: 77 77 81   Resp: _0 Temp:    97.6 F (36.4 C)  TempSrc:    Axillary  SpO2: 95% 94% 93%   Weight:      Height:        Intake/Output Summary (Last 24 hours) at 09/21/2017 0921 Last data filed at 09/21/2017 0500 Gross per 24 hour  Intake 573 ml  Output 1800 ml  Net -1227 ml   Filed Weights   09/19/17 0500 09/20/17 1000 09/21/17 0451  Weight: 71.9 kg (158 lb 8.2 oz) 73.1 kg (161 lb 2.5 oz) 74 kg (163 lb 2.3 oz)    Examination:  General exam: Appears calm on [place] oxygen. No in distress.  Respiratory system: bilateral crackles, no wheezing or rhonchi. Air entry fair.  Cardiovascular system: S1 & S2 heard, irregular, No JVD,3 + pedal edema.  Gastrointestinal system: Abdomen is soft non tender non distended bowel sounds heard.  Central nervous system: Alert and oriented. Grossly non focal.  Extremities: pedal edema 3+ Skin: deep tissue injury over the sacral area Psychiatry:Mood & affect appropriate.     Data Reviewed: I have personally reviewed following labs and imaging studies  CBC: Recent Labs  Lab 09/18/17 1718 09/19/17 0151  WBC 10.3 9.1  NEUTROABS 9.7* 8.4*  HGB 9.6* 9.8*  HCT  28.8* 29.7*  MCV 101.1* 102.4*  PLT 345 888   Basic Metabolic Panel: Recent Labs  Lab 09/18/17 1718 09/19/17 0151 09/20/17 0500 09/21/17 0333  NA 136 137 135 138  K 3.5 3.9 4.5 4.6  CL 100* 101 97* 100*  CO2 _0 GLUCOSE 87 85 73 116*  BUN 28* 31* 33* 37*  CREATININE 0.77 0.90 0.96 1.10  CALCIUM 7.8* 8.0* 8.1* 8.0*   GFR: Estimated Creatinine Clearance: 55.1 mL/min (by C-G formula based on SCr of 1.1 mg/dL). Liver Function Tests: Recent Labs  Lab 09/18/17 1718  AST 15  ALT 10*  ALKPHOS 273*  BILITOT 0.6  PROT 4.4*  ALBUMIN 1.6*   No results for input(s): LIPASE, AMYLASE in the last 168 hours. No results for input(s): AMMONIA in the last 168 hours. Coagulation Profile: Recent Labs  Lab 09/18/17 1718  INR 2.01   Cardiac Enzymes: Recent Labs  Lab 09/18/17 1718 09/18/17 1951 09/19/17 0151 09/19/17 0834  TROPONINI 0.10* 0.10* 0.10* 0.09*   BNP (last 3 results) No results for input(s): PROBNP in the last 8760 hours. HbA1C: No results for input(s): HGBA1C in the last 72 hours. CBG: No results for input(s): GLUCAP in the last 168 hours. Lipid Profile: No results for input(s): CHOL, HDL, LDLCALC, TRIG, CHOLHDL, LDLDIRECT in the last 72 hours. Thyroid Function Tests: No results for input(s): TSH, T4TOTAL, FREET4, T3FREE, THYROIDAB in the last 72 hours. Anemia Panel: Recent Labs    09/19/17 0151 09/19/17 1335  VITAMINB12  --  4,049*  FOLATE  --  5.9*  FERRITIN  --  915*  TIBC  --  132*  IRON  --  29*  RETICCTPCT 1.4  --    Sepsis Labs: Recent Labs  Lab 09/18/17 1725  LATICACIDVEN 0.69    Recent Results (from the past 240 hour(s))  MRSA PCR Screening     Status: None   Collection Time: 09/18/17 10:25 PM  Result Value Ref Range Status   MRSA by PCR NEGATIVE NEGATIVE Final    Comment:        The GeneXpert MRSA Assay (FDA approved for NASAL specimens only), is one component of a comprehensive MRSA colonization surveillance program. It  is not intended to diagnose MRSA infection nor to guide or monitor treatment for MRSA infections. Performed at [facility], [place] [address], [place],  [address]          Radiology Studies: No results found.      Scheduled Meds: . acidophilus  1 capsule Oral Daily  . acyclovir  400 mg Oral Daily  . Chlorhexidine Gluconate Cloth  6 each Topical Daily  . folic acid  1 mg Oral Daily  . furosemide  40 mg Intravenous BID  . miconazole nitrate   Topical BID  . midodrine  10 mg Oral TID WC  . potassium chloride SA  20 mEq Oral BID  . pregabalin  25 mg Oral BID  . rivaroxaban  20 mg Oral Q supper  . senna  1 tablet Oral Daily  . sodium chloride flush  10-40 mL Intracatheter Q12H  . sodium chloride flush  3 mL Intravenous Q12H   Continuous Infusions: . sodium chloride       LOS: 3 days    Time spent: 35 mintues.     Hosie Poisson, MD [facility] Pager [phone]   If 7PM-7AM, please contact night-coverage [email or web address] Password [facility] 09/21/2017, 9:21 AM

## 2017-09-21 NOTE — Progress Notes (Signed)
Initial Nutrition Assessment  DOCUMENTATION CODES:   Non-severe (moderate) malnutrition in context of chronic illness  INTERVENTION:   -Provide Ensure Enlive po daily, each supplement provides 350 kcal and 20 grams of protein -Provide Boost Breeze po TID w/ meals, each supplement provides 250 kcal and 9 grams of protein  NUTRITION DIAGNOSIS:   Moderate Malnutrition related to chronic illness, cancer and cancer related treatments(CHF) as evidenced by moderate fat depletion, severe muscle depletion.  GOAL:   Patient will meet greater than or equal to 90% of their needs  MONITOR:   PO intake, Supplement acceptance, Labs, Weight trends, Skin, I & O's  REASON FOR ASSESSMENT:   Consult Assessment of nutrition requirement/status  ASSESSMENT:   72 y.o. male with medical history significant of diastolic CHF last EF 55 to 60%, A. fib on Xarelto, COPD, oxygen dependent on 3 L, and malignant lymphoplasmayctic lymphoma; who presents with complaints of weakness over the last 2-3 days associated with swelling of the lower extremities and hands and severe pain in the back.   Patient has been consuming 50-100% of meals, averaging 250-600 kcal and 10-25g protein each. Per MD notes, pt to continue to IV diurese. Pt's wife reports pt has been eating very little of his meals PTA (this has been consistent since 2018). Pt with new pressure injuries on sacrum and coccyx. Will continue supplements of Ensure and Boost Breeze to provide additional protein and calories.  Per chart review, pt's weight tends to fluctuate. UBW is typically in the ~180 lb. Pt has had a weight down to 139 lb per records. Unable to determine true weight at this time given fluid accumulation. Suspect to see weight decrease after receiving IV Lasix.   Labs reviewed. Medications: Folic acid tablet daily, IV Lasix BID, K-DUR tablet BID, Senokot tablet daily  NUTRITION - FOCUSED PHYSICAL EXAM:    Most Recent Value  Orbital Region   Moderate depletion  Upper Arm Region  Severe depletion  Thoracic and Lumbar Region  Unable to assess  Buccal Region  Moderate depletion  Temple Region  Moderate depletion  Clavicle Bone Region  Moderate depletion  Clavicle and Acromion Bone Region  Severe depletion  Scapular Bone Region  Unable to assess  Dorsal Hand  Unable to assess  Patellar Region  Severe depletion  Anterior Thigh Region  Severe depletion  Posterior Calf Region  Moderate depletion  Edema (RD Assessment)  Moderate       Diet Order:   Diet Order           Diet Heart Room service appropriate? Yes; Fluid consistency: Thin; Fluid restriction: 2000 mL Fluid  Diet effective now          EDUCATION NEEDS:   Not appropriate for education at this time  Skin:  Skin Assessment: Skin Integrity Issues: Skin Integrity Issues:: Stage II, Stage I Stage I: coccyx Stage II: sacrum  Last BM:  5/7  Height:   Ht Readings from Last 1 Encounters:  09/18/17 _0  (1.702 m)    Weight:   Wt Readings from Last 1 Encounters:  09/21/17 163 lb 2.3 oz (74 kg)    Ideal Body Weight:  67.2 kg  BMI:  Body mass index is 25.55 kg/m.  Estimated Nutritional Needs:   Kcal:  2000-2200  Protein:  105-115g  Fluid:  2L/day  Christopher Bibles, MS, RD, LDN [place] Dietitian Pager: [phone] After Hours Pager: [phone]

## 2017-09-21 NOTE — Evaluation (Signed)
Clinical/Bedside Swallow Evaluation Patient Details  Name: [name] MRN: [id number] Date of Birth: 1946-01-25  Today's Date: 09/21/2017 Time: SLP Start Time (ACUTE ONLY): 1555 SLP Stop Time (ACUTE ONLY): 1610 SLP Time Calculation (min) (ACUTE ONLY): 15 min  Past Medical History:  Past Medical History:  Diagnosis Date  . Anxiety   . Arthritis    "neck" (08/15/2015)  . Chronic bronchitis ([place])   . Chronic diastolic CHF (congestive heart failure) ([place])   . Constipation   . COPD (chronic obstructive pulmonary disease) ([place])   . DDD (degenerative disc disease), cervical   . Depression   . Dilated aortic root ([place])    52m on echo 05/2016  . Edema of left lower extremity   . Facial basal cell cancer   . H/O acne vulgaris 1960s   [facility]  . Headache    history of - left temporal- years ago- not current (08/15/2015)  . Persistent atrial fibrillation (HCC)    on coumadin with CHADS2VASC score of 2  . Pneumonia 1960s; 2015 X 2  . Prostate cancer ([place] early 2000s   "low spreading; non aggressive type" (08/15/2015)  . Pulmonary HTN ([place]    PASP 445mg by echo 05/2016  . Skin cancer    "back"   Past Surgical History:  Past Surgical History:  Procedure Laterality Date  . CARDIOVERSION N/A 10/11/2016   Procedure: CARDIOVERSION;  Surgeon: CrSanda KleinMD;  [facility];  Service: Cardiovascular;  Laterality: N/A;  . CARDIOVERSION N/A 02/26/2017   Procedure: CARDIOVERSION;  Surgeon: McLarey DresserMD;  [facility];  Service: Cardiovascular;  Laterality: N/A;  . COLONOSCOPY    . EXCISIONAL HEMORRHOIDECTOMY  1960s  . INGUINAL HERNIA REPAIR Right 2002  . INGUINAL HERNIA REPAIR Bilateral 08/15/2015  . INGUINAL HERNIA REPAIR Bilateral 08/15/2015   Procedure: OPEN REPAIR RECURRENT RIGHT INGUINAL HERNIA WITH MESH AND REPAIR LEFT INGUINAL HERNIA WITH MESH;  Surgeon: HaFanny SkatesMD;  [facility] Service:  General;  Laterality: Bilateral;  . INSERTION OF MESH Bilateral 08/15/2015   Procedure: INSERTION OF MESH;  Surgeon: HaFanny SkatesMD;  [facility] Service: General;  Laterality: Bilateral;  . IR FLUORO GUIDE PORT INSERTION RIGHT  06/20/2017  . IR THORACENTESIS ASP PLEURAL SPACE W/IMG GUIDE  11/18/2016  . IR [place] VASC ACCESS RIGHT  06/20/2017  . LYMPH NODE BIOPSY Bilateral 10/22/2016   Procedure: LEFT NECK LYMPH NODE BIOPSY;  Surgeon: VaIvin PootMD;  [facility] Service: Thoracic;  Laterality: Bilateral;  . PROSTATE BIOPSY    . TONSILLECTOMY  1950s  . VIDEO ASSISTED THORACOSCOPY (VATS)/EMPYEMA Right 05/20/2016   Procedure: VIDEO ASSISTED THORACOSCOPY (VATS) with drainage of pleural effusion;  Surgeon: PeIvin PootMD;  [facility] Service: Thoracic;  Laterality: Right;  . Marland KitchenIDEO BRONCHOSCOPY WITH ENDOBRONCHIAL ULTRASOUND Right 05/20/2016   Procedure: VIDEO BRONCHOSCOPY WITH ENDOBRONCHIAL ULTRASOUND;  Surgeon: PeIvin PootMD;  [facility];  Service: Thoracic;  Laterality: Right;   HPI:  [id number].o.malewith COPD and chronic hypoxic respiratory failure, chronic diastolic heart failure with AL amyloidosis, prostate cancer, persistent atrial fibrillation on Xarelto, severe chronic pain,malignant lymphomaplasmacytic lymphoma, and orthostatic hypotension.Now presenting with weakness and leg swelling, diagnosed with acute on chronic diastolic heart failure. CXR 09/21/17 showed enlargement of effusions, worsened pulmonary density, PNA not excluded.   Assessment / Plan / Recommendation Clinical Impression  Patient presents with oropharyngeal  swallow which appears at bedside to be within functional limits with adequate airway protection. No overt signs of aspiration observed despite challenging with consecutive straw sips of thin liquids in excess of 3oz. Pt has few teeth, however mastication functional and adequate for regular solids. Swallow appears brisk, voice clear, vitals  stable. Pt, wife deny history of dysphagia or coughing with meals, although pt reports occasional gagging episodes related to taste if he attempts to eat something after 8pm. He avoids this issue by eating earlier in the evening. Recommend regular diet with thin liquids, meds whole with liquid. No further skilled ST needs identified. Will s/o.   SLP Visit Diagnosis: Dysphagia, unspecified (R13.10)    Aspiration Risk  Mild aspiration risk    Diet Recommendation Regular;Thin liquid   Liquid Administration via: Cup;Straw Medication Administration: Whole meds with liquid Supervision: Patient able to self feed    Other  Recommendations Oral Care Recommendations: Oral care BID   Follow up Recommendations None      Frequency and Duration            Prognosis Prognosis for Safe Diet Advancement: Good      Swallow Study   General Date of Onset: 09/18/17 HPI: 72 y.o.malewith COPD and chronic hypoxic respiratory failure, chronic diastolic heart failure with AL amyloidosis, prostate cancer, persistent atrial fibrillation on Xarelto, severe chronic pain,malignant lymphomaplasmacytic lymphoma, and orthostatic hypotension.Now presenting with weakness and leg swelling, diagnosed with acute on chronic diastolic heart failure. CXR 09/21/17 showed enlargement of effusions, worsened pulmonary density, PNA not excluded. Type of Study: Bedside Swallow Evaluation Previous Swallow Assessment: none in chart Diet Prior to this Study: Regular;Thin liquids Temperature Spikes Noted: Yes(slight 99.3) Respiratory Status: Nasal cannula History of Recent Intubation: No Behavior/Cognition: Alert;Cooperative;Pleasant mood Oral Cavity Assessment: Dry Oral Care Completed by SLP: No Oral Cavity - Dentition: Missing dentition;Poor condition Vision: Functional for self-feeding Self-Feeding Abilities: Able to feed self Patient Positioning: Upright in bed Baseline Vocal Quality: Normal Volitional Swallow: Able  to elicit    Oral/Motor/Sensory Function Overall Oral Motor/Sensory Function: Within functional limits   Ice Chips Ice chips: Not tested   Thin Liquid Thin Liquid: Within functional limits Presentation: Straw;Self Fed    Nectar Thick Nectar Thick Liquid: Not tested   Honey Thick Honey Thick Liquid: Not tested   Puree Puree: Within functional limits Presentation: [place], MS, CCC-SLP Speech-Language Pathologist  Solid: Within functional limits Presentation: [place] 09/21/2017,4:27 PM

## 2017-09-22 ENCOUNTER — Inpatient Hospital Stay (HOSPITAL_COMMUNITY): Payer: Medicare Other

## 2017-09-22 LAB — BODY FLUID CELL COUNT WITH DIFFERENTIAL
Eos, Fluid: 0 %
Lymphs, Fluid: 6 %
Monocyte-Macrophage-Serous Fluid: 32 % — ABNORMAL LOW (ref 50–90)
Neutrophil Count, Fluid: 62 % — ABNORMAL HIGH (ref 0–25)
Total Nucleated Cell Count, Fluid: 305 cu mm (ref 0–1000)

## 2017-09-22 LAB — EXPECTORATED SPUTUM ASSESSMENT W REFEX TO RESP CULTURE

## 2017-09-22 LAB — PROTEIN, PLEURAL OR PERITONEAL FLUID: Total protein, fluid: <3 g/dL

## 2017-09-22 LAB — BASIC METABOLIC PANEL
Anion gap: 10 (ref 5–15)
BUN: 39 mg/dL — AB (ref 6–20)
CHLORIDE: 100 mmol/L — AB (ref 101–111)
CO2: 29 mmol/L (ref 22–32)
CREATININE: 1.1 mg/dL (ref 0.61–1.24)
Calcium: 8.2 mg/dL — ABNORMAL LOW (ref 8.9–10.3)
GFR calc non Af Amer: >60 mL/min (ref >60)
Glucose, Bld: 88 mg/dL (ref 65–99)
Potassium: 4.7 mmol/L (ref 3.5–5.1)
Sodium: 139 mmol/L (ref 135–145)

## 2017-09-22 LAB — ALBUMIN, PLEURAL OR PERITONEAL FLUID

## 2017-09-22 LAB — EXPECTORATED SPUTUM ASSESSMENT W GRAM STAIN, RFLX TO RESP C

## 2017-09-22 LAB — BRAIN NATRIURETIC PEPTIDE: B NATRIURETIC PEPTIDE 5: 1282.2 pg/mL — AB (ref 0.0–100.0)

## 2017-09-22 LAB — GLUCOSE, PLEURAL OR PERITONEAL FLUID: GLUCOSE FL: 114 mg/dL

## 2017-09-22 MED ORDER — SIMETHICONE 40 MG/0.6ML PO SUSP
40.0000 mg | Freq: Four times a day (QID) | ORAL | Status: DC | PRN
  Administered 2017-09-22: 40 mg via ORAL
  Filled 2017-09-22: qty 0.6

## 2017-09-22 MED ORDER — POLYETHYLENE GLYCOL 3350 17 G PO PACK: 17.0000 g | PACK | Freq: Every day | ORAL | Status: DC | PRN

## 2017-09-22 MED ORDER — LIDOCAINE HCL 1 % IJ SOLN
INTRAMUSCULAR | Status: AC
  Administered 2017-09-22: 14:00:00
  Filled 2017-09-22: qty 20

## 2017-09-22 MED ORDER — HYDROCODONE-ACETAMINOPHEN 10-325 MG PO TABS
1.0000 | ORAL_TABLET | ORAL | Status: DC | PRN
  Administered 2017-09-23 – 2017-09-24 (×2): 1 via ORAL
  Filled 2017-09-22 (×2): qty 1

## 2017-09-22 NOTE — Procedures (Signed)
Ultrasound-guided diagnostic and therapeutic left thoracentesis performed yielding 870 cc of light yellow fluid. No immediate complications. Follow-up chest x-ray pending.The fluid was sent to the lab for preordered studies. No sig free fluid noted in right pleural space.

## 2017-09-22 NOTE — Progress Notes (Addendum)
Progress Note  Patient Name: [name] Date of Encounter: 09/22/2017  Primary Cardiologist: [name], MD   Subjective   Better today compared to yesterday (bad constipation). He finally had a BM and much relief. Still complaining of back pain (wife reports h/o DJD). Asymptomatic with his afib. No palpitations. No dyspnea. Wife notes his lower extremity is much improved.  Inpatient Medications    Scheduled Meds: . acidophilus  1 capsule Oral Daily  . acyclovir  400 mg Oral Daily  . amoxicillin-clavulanate  1 tablet Oral Q12H  . Chlorhexidine Gluconate Cloth  6 each Topical Daily  . feeding supplement  1 Container Oral TID WC  . feeding supplement (ENSURE ENLIVE)  237 mL Oral Q24H  . folic acid  1 mg Oral Daily  . furosemide  40 mg Intravenous BID  . miconazole nitrate   Topical BID  . midodrine  10 mg Oral TID WC  . potassium chloride SA  20 mEq Oral BID  . pregabalin  25 mg Oral BID  . rivaroxaban  20 mg Oral Q supper  . senna  1 tablet Oral Daily  . sodium chloride flush  10-40 mL Intracatheter Q12H  . sodium chloride flush  3 mL Intravenous Q12H   Continuous Infusions: . sodium chloride     PRN Meds: sodium chloride, acetaminophen, fentaNYL (SUBLIMAZE) injection, guaiFENesin-dextromethorphan, HYDROcodone-acetaminophen, morphine injection, ondansetron (ZOFRAN) IV, polyethylene glycol, sodium chloride flush, sodium chloride flush   Vital Signs    Vitals:   09/21/17 2004 09/22/17 0000 09/22/17 0400 09/22/17 0810  BP:  (!) 89/47 (!) 88/59   Pulse:  72 72   Resp:  13 (!) 9   Temp: 98 F (36.7 C) (!) 97.4 F (36.3 C) (!) 97.3 F (36.3 C) (!) 96.4 F (35.8 C)  TempSrc: Oral Oral Oral Oral  SpO2:  100% 100%   Weight:      Height:        Intake/Output Summary (Last 24 hours) at 09/22/2017 0837 Last data filed at 09/22/2017 0000 Gross per 24 hour  Intake 310 ml  Output 1200 ml  Net -890 ml   Filed Weights   09/19/17 0500 09/20/17 1000 09/21/17 0451    Weight: 158 lb 8.2 oz (71.9 kg) 161 lb 2.5 oz (73.1 kg) 163 lb 2.3 oz (74 kg)    Telemetry    Atrial fibrillation w/ CVR in the 70s-80s - Personally Reviewed  ECG    Persistent atrial  - Personally Reviewed  Physical Exam   GEN: No acute distress.  Frail appearing, looks weak but A&Ox 3.   Neck: No JVD Cardiac: irreguarlly irregular rhythm, regular rate, no murmurs, rubs, or gallops.  Respiratory: decreased BS at the bases  GI: Soft, nontender, non-distended  MS: 2+ bilateral ankle edema Neuro:  Nonfocal  Psych: Normal affect   Labs    Chemistry Recent Labs  Lab 09/18/17 1718  09/20/17 0500 09/21/17 0333 09/22/17 0500  NA 136   < > 135 138 139  K 3.5   < > 4.5 4.6 4.7  CL 100*   < > 97* 100* 100*  CO2 26   < > _0 GLUCOSE 87   < > 73 116* 88  BUN 28*   < > 33* 37* 39*  CREATININE 0.77   < > 0.96 1.10 1.10  CALCIUM 7.8*   < > 8.1* 8.0* 8.2*  PROT 4.4*  --   --   --   --  ALBUMIN 1.6*  --   --   --   --   AST 15  --   --   --   --   ALT 10*  --   --   --   --   ALKPHOS 273*  --   --   --   --   BILITOT 0.6  --   --   --   --   GFRNONAA >60   < > >60 >60 >60  GFRAA >60   < > >60 >60 >60  ANIONGAP 10   < > _0 < > = values in this interval not displayed.     Hematology Recent Labs  Lab 09/18/17 1718 09/19/17 0151 09/21/17 1442  WBC 10.3 9.1 6.4  RBC 2.85* 2.90*  2.89* 2.98*  HGB 9.6* 9.8* 10.0*  HCT 28.8* 29.7* 30.8*  MCV 101.1* 102.4* 103.4*  MCH 33.7 33.8 33.6  MCHC 33.3 33.0 32.5  RDW 15.8* 15.3 15.5  PLT 345 296 304    Cardiac Enzymes Recent Labs  Lab 09/18/17 1718 09/18/17 1951 09/19/17 0151 09/19/17 0834  TROPONINI 0.10* 0.10* 0.10* 0.09*   No results for input(s): TROPIPOC in the last 168 hours.   BNP Recent Labs  Lab 09/18/17 1718  BNP 1,924.4*     DDimer No results for input(s): DDIMER in the last 168 hours.   Radiology    Dg Chest 2 View  Result Date: 09/21/2017 CLINICAL DATA:  Follow-up pulmonary edema.  EXAM: CHEST - 2 VIEW COMPARISON:  09/18/2017 FINDINGS: Cardiomegaly as seen previously. Redemonstration of bilateral effusions with pulmonary atelectasis. Effusions may be larger. Pulmonary density appears worsened, particularly on the right. IMPRESSION: Radiographic worsening. Enlargement of effusions, particularly on the right. Worsened pulmonary density presumed represent worsened edema. Can't rule out pneumonia particularly on the right. Electronically Signed   By: Nelson Chimes M.D.   On: 09/21/2017 13:29    Cardiac Studies   Echocardiogram 09/19/2017: Study Conclusions  - Procedure narrative: The echo was ended before completing the protocol due to the patients pain level due to chemo and radiation. - Left ventricle: The cavity size was normal. Wall thickness was increased in a pattern of moderate LVH. Systolic function was normal. The estimated ejection fraction was in the range of 60% to 65%. Wall motion was normal; there were no regional wall motion abnormalities. The study is not technically sufficient to allow evaluation of LV diastolic function. - Mitral valve: Calcified annulus. Mildly thickened leaflets . There was mild regurgitation. - Left atrium: Severely dilated. - Tricuspid valve: Mildly thickened leaflets. There was trivial regurgitation.  Impressions:  - Incomplete study -discontinued due to patient discomfort. LVEF 60-65%, moderate LVH, mild MR, severe LAE, apperas in a-fib. There is a prominent left pleural effusion.   Patient Profile     72 y.o. male with COPD and chronic hypoxic respiratory failure, chronic diastolic heart failure with AL amyloidosis, prostate cancer, persistent atrial fibrillation on Xarelto, severe chronic pain,malignant lymphomaplasmacytic lymphoma, orthostatic hypotension and h/o nephrotic syndrome.Now presenting with weakness and leg swelling, diagnosed with acute on chronic diastolic heart failure.  Assessment  & Plan    1. Acute on Chronic Diastolic CHF: EF normal on echo, 60-65%. BNP 1,924 on admit. CXR on 09/18/17 with pulmonary edema and bilateral pleural effusions. Repeat CXR 5/12 with enlargement of effusions, R>L + progression of edema, in comparison to previous study. He has been treated with IV Lasix, 40 mg BID. 1.2L out in  past 24 hrs. Net I/Os negative 3.5L. Renal function and K stable, but medical therapy/ aggressive diuresis limited by hypotension. He continues to have bilateraly LEE on exam, but wife notes it is markedly improved compared to when he was first admitted. Unsure how much of this is CHF vs edema from hypoalbuminemia, 1.6. Will recheck BNP. Hold AM lasix for now given hypotension. MD to follow with further recommendations.   2. 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 Aundra Dubin in the [facility], diagnosed by renal biopsy.   3. Persistent Atrial Fibrillation: failed cardioversion in the past. Last attempt 02/2017. He remains rate controlled, in the 70s-80. Asymptomatic. On anticoagulation with Xarelto.   4. 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 Continue midodrine and compression stockings.   5. H/o Nephrotic Syndrome: suspect due to AL amyloidosis.    For questions or updates, please contact [place] Please consult [email or web address] for contact info under Cardiology/STEMI.      Signed, Lyda Jester, PA-C  09/22/2017, 8:37 AM    As above, patient seen and examined.  He has mild dyspnea but denies chest pain.  He remains volume overloaded with anasarca.  This is likely secondary to hypoalbuminemia from poor nutrition and nephrotic syndrome.  There may also be a component of diastolic congestive heart failure.  Diuresis is limited by hypotension.  We will continue Lasix as tolerated by blood pressure.  Continue midodrine.  Continue Xarelto for atrial fibrillation.  Long-term prognosis would appear to be poor.  Kirk Ruths, MD

## 2017-09-22 NOTE — Progress Notes (Signed)
PROGRESS NOTE    [name]  HGD:[id number] DOB: June 03, 1945 DOA: 09/18/2017 PCP: Biagio Borg, MD    Brief Narrative:  [name] is a 72 y.o. male with medical history significant of diastolic CHF last EF 55 to 60%, A. fib on Xarelto, COPD, oxygen dependent on 3 L, and malignant lymphoplasmayctic lymphoma; who presents with complaints of weakness over the last 2-3 days associated with swelling of the lower extremities and hands and severe pain in the back. He had chemo about 3 weeks ago and follows up with Dr Irene Limbo. Chest x-ray showing signs of congestive heart failure with pulmonary edema and pleural effusions.       Assessment & Plan:   Principal Problem:   Diastolic CHF ([place]) Active Problems:   COPD mixed type (HCC)   Hypotension   Malignant lymphoplasmacytic lymphoma (HCC)   Elevated troponin   Tinea cruris   Generalized weakness   Acute respiratory failure with hypoxia secondary to acute on chronic diastolic heart failure:  Admitted to step down and started him on IV lasix BID, diuresed about 3. 5 liters since admission. His breathing and pedal edema has improved. Strict intake and output and use ted hoses or compression stockings.  Monitor k and creatinine on lv lasix and hold lasix for hypotension.  Cardiology consulted and recommendations given.  Echocardiogram ordered and reviewed.  Patient's bp is low to aggressively diurese him.  Continues to require  upto 4 lit of [place] oxygen to keep sats greater than 90%.  Repeat CXR shows worsening aeration with pleural effusion on the right greater than left.  Will get US thoracentesis and send fluid for analysis.     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  Will request Dr Irene Limbo for recommendations.    Chronic atrial fibrillation  Rate controlled on xarelto.    Anemia with  Iron and folate deficiency: Folate  added.    H/o nephrotic syndrome Would explain the hypoalbuminemia.    H/o orthostatic hypotension on midodrine. No change in medications.     Severe protein calorie malnutrition:  Nutrition consulted.    Chronic pain :  Unclear etiology. Will probably need referral to pain clinic on discharge.  Currently on lyrica, oxy and morphine.    H/o COPD: No wheezing heard. Resume duonebs as needed.    Elevated troponin: possibly from demand ischemia from CHF. Pt denies any chest pain .   H/o prostate cancer:  Follow up with oncology as recommended.   Pressure injury of the sacrum:  Wound care consult and recommendations given.    DVT prophylaxis: lovenox.  Code Status: full code.  Family Communication: none at bedside.  Disposition Plan: pending PT evaluation.   Consultants:   cardology  Wound care consult.    Procedures: echocardiogram.   Antimicrobials: none.   Subjective: Sleeping calmly , not in distress. no new complaints.   Objective: Vitals:   09/21/17 2000 09/21/17 2004 09/22/17 0000 09/22/17 0400  BP: 94/60  (!) 89/47 (!) 88/59  Pulse: 77  72 72  Resp: 16  13 (!) 9  Temp:  98 F (36.7 C) (!) 97.4 F (36.3 C) (!) 97.3 F (36.3 C)  TempSrc:  Oral Oral Oral  SpO2: 99%  100% 100%  Weight:      Height:        Intake/Output Summary (Last 24 hours) at 09/22/2017 9622 Last data filed at 09/22/2017 0000 Gross per 24 hour  Intake 310 ml  Output 1200 ml  Net -890 ml   Filed Weights   09/19/17 0500 09/20/17 1000 09/21/17 0451  Weight: 71.9 kg (158 lb 8.2 oz) 73.1 kg (161 lb 2.5 oz) 74 kg (163 lb 2.3 oz)    Examination:  General exam: Appears calm on [place] oxygen. No in distress.   Respiratory system: bilateral crackles, no wheezing or rhonchi. Air entry fair.  Cardiovascular system: S1 & S2 heard, irregular, No JVD,3 + pedal edema.  Gastrointestinal system: Abdomen is soft non tender non distended bowel sounds heard.  Central nervous system: Alert  and oriented. Grossly non focal.  Extremities: pedal edema 3+ Skin: deep tissue injury over the sacral area Psychiatry:Mood & affect appropriate.     Data Reviewed: I have personally reviewed following labs and imaging studies  CBC: Recent Labs  Lab 09/18/17 1718 09/19/17 0151 09/21/17 1442  WBC 10.3 9.1 6.4  NEUTROABS 9.7* 8.4* 6.0  HGB 9.6* 9.8* 10.0*  HCT 28.8* 29.7* 30.8*  MCV 101.1* 102.4* 103.4*  PLT 345 296 510   Basic Metabolic Panel: Recent Labs  Lab 09/18/17 1718 09/19/17 0151 09/20/17 0500 09/21/17 0333 09/22/17 0500  NA 136 137 135 138 139  K 3.5 3.9 4.5 4.6 4.7  CL 100* 101 97* 100* 100*  CO2 _0 GLUCOSE 87 85 73 116* 88  BUN 28* 31* 33* 37* 39*  CREATININE 0.77 0.90 0.96 1.10 1.10  CALCIUM 7.8* 8.0* 8.1* 8.0* 8.2*   GFR: Estimated Creatinine Clearance: 55.1 mL/min (by C-G formula based on SCr of 1.1 mg/dL). Liver Function Tests: Recent Labs  Lab 09/18/17 1718  AST 15  ALT 10*  ALKPHOS 273*  BILITOT 0.6  PROT 4.4*  ALBUMIN 1.6*   No results for input(s): LIPASE, AMYLASE in the last 168 hours. No results for input(s): AMMONIA in the last 168 hours. Coagulation Profile: Recent Labs  Lab 09/18/17 1718  INR 2.01   Cardiac Enzymes: Recent Labs  Lab 09/18/17 1718 09/18/17 1951 09/19/17 0151 09/19/17 0834  TROPONINI 0.10* 0.10* 0.10* 0.09*   BNP (last 3 results) No results for input(s): PROBNP in the last 8760 hours. HbA1C: No results for input(s): HGBA1C in the last 72 hours. CBG: No results for input(s): GLUCAP in the last 168 hours. Lipid Profile: No results for input(s): CHOL, HDL, LDLCALC, TRIG, CHOLHDL, LDLDIRECT in the last 72 hours. Thyroid Function Tests: No results for input(s): TSH, T4TOTAL, FREET4, T3FREE, THYROIDAB in the last 72 hours. Anemia Panel: Recent Labs    09/19/17 1335  VITAMINB12 4,049*  FOLATE 5.9*  FERRITIN 915*  TIBC 132*  IRON 29*   Sepsis Labs: Recent Labs  Lab 09/18/17 1725    LATICACIDVEN 0.69    Recent Results (from the past 240 hour(s))  MRSA PCR Screening     Status: None   Collection Time: 09/18/17 10:25 PM  Result Value Ref Range Status   MRSA by PCR NEGATIVE NEGATIVE Final    Comment:        The GeneXpert MRSA Assay (FDA approved for NASAL specimens only), is one component of a comprehensive MRSA colonization surveillance program. It is not intended to diagnose MRSA infection nor to guide or monitor treatment for MRSA infections. Performed at [facility], [place] [address], [place], [place] [address]   Culture, expectorated sputum-assessment     Status: None   Collection Time: 09/21/17  2:42 PM  Result Value Ref Range Status   Specimen Description SPUTUM  Final   Special Requests   Final    NONE Performed at [facility] [address] [place], [place] [address]    Sputum evaluation THIS SPECIMEN IS ACCEPTABLE FOR SPUTUM CULTURE  Final   Report Status 09/22/2017 FINAL  Final  Culture, respiratory (NON-Expectorated)     Status: None (Preliminary result)   Collection Time: 09/21/17  2:42 PM  Result Value Ref Range Status   Specimen Description SPUTUM  Final   Special Requests NONE Reflexed from [id number]  Final   Gram Stain   Final    FEW WBC PRESENT, PREDOMINANTLY PMN ABUNDANT GRAM POSITIVE RODS FEW GRAM POSITIVE COCCI IN CLUSTERS FEW GRAM NEGATIVE RODS Performed at [facility], [place] [address], [place], [place] [address]    Culture PENDING  Incomplete   Report Status PENDING  Incomplete         Radiology Studies: Dg Chest 2 View  Result Date: 09/21/2017 CLINICAL DATA:  Follow-up pulmonary edema. EXAM: CHEST - 2 VIEW COMPARISON:  09/18/2017 FINDINGS: Cardiomegaly as seen previously. Redemonstration of bilateral effusions with pulmonary atelectasis. Effusions may be larger. Pulmonary density appears worsened, particularly on the right. IMPRESSION: Radiographic worsening. Enlargement of  effusions, particularly on the right. Worsened pulmonary density presumed represent worsened edema. Can't rule out pneumonia particularly on the right. Electronically Signed   By: Nelson Chimes M.D.   On: 09/21/2017 13:29        Scheduled Meds: . acidophilus  1 capsule Oral Daily  . acyclovir  400 mg Oral Daily  . amoxicillin-clavulanate  1 tablet Oral Q12H  . Chlorhexidine Gluconate Cloth  6 each Topical Daily  . feeding supplement  1 Container Oral TID WC  . feeding supplement (ENSURE ENLIVE)  237 mL Oral Q24H  . folic acid  1 mg Oral Daily  . furosemide  40 mg Intravenous BID  . miconazole nitrate   Topical BID  . midodrine  10 mg Oral TID WC  . potassium chloride SA  20 mEq Oral BID  . pregabalin  25 mg Oral BID  . rivaroxaban  20 mg Oral Q supper  . senna  1 tablet Oral Daily  . sodium chloride flush  10-40 mL Intracatheter Q12H  . sodium chloride flush  3 mL Intravenous Q12H   Continuous Infusions: . sodium chloride       LOS: 4 days    Time spent: 35 mintues.     Hosie Poisson, MD [facility] Pager [phone]   If 7PM-7AM, please contact night-coverage [email or web address] Password [email or web address] 09/22/2017, 8:22 AM

## 2017-09-22 NOTE — Progress Notes (Signed)
**Note Christopher-Identified via Obfuscation** PT Cancellation Note  Patient Details Name: DEMTRIUS [name] MRN: [id number] DOB: 09-24-1945   Cancelled Treatment:    Reason Eval/Treat Not Completed: Other (comment) Patient just got settled into bed after tri[p to [facility] with nursing. Will check back this PM.  Claretha Cooper 09/22/2017, 11:01 AM Tresa Endo PT [phone]

## 2017-09-22 NOTE — Progress Notes (Signed)
PT Cancellation Note  Patient Details Name: [name] MRN: [id number] DOB: [birth year]   Cancelled Treatment:    Reason Eval/Treat Not Completed: Medical issues which prohibited therapy. Going for tharacentesis.    Claretha Cooper 09/22/2017, 2:01 PM Tresa Endo PT [phone]

## 2017-09-22 NOTE — Progress Notes (Signed)
Pt. BP of 77/44. MD made aware.

## 2017-09-23 ENCOUNTER — Inpatient Hospital Stay (HOSPITAL_COMMUNITY): Payer: Medicare Other

## 2017-09-23 ENCOUNTER — Ambulatory Visit: Payer: Medicare Other | Admitting: Internal Medicine

## 2017-09-23 ENCOUNTER — Inpatient Hospital Stay: Payer: Medicare Other

## 2017-09-23 ENCOUNTER — Telehealth: Payer: Self-pay

## 2017-09-23 LAB — URINALYSIS, ROUTINE W REFLEX MICROSCOPIC
BILIRUBIN URINE: NEGATIVE
Glucose, UA: NEGATIVE mg/dL
Ketones, ur: NEGATIVE mg/dL
Leukocytes, UA: NEGATIVE
Nitrite: NEGATIVE
PH: 5 (ref 5.0–8.0)
Protein, ur: 30 mg/dL — AB
RBC / HPF: >50 RBC/hpf — ABNORMAL HIGH (ref 0–5)
SPECIFIC GRAVITY, URINE: 1.009 (ref 1.005–1.030)

## 2017-09-23 LAB — BASIC METABOLIC PANEL
Anion gap: 11 (ref 5–15)
BUN: 44 mg/dL — AB (ref 6–20)
CHLORIDE: 99 mmol/L — AB (ref 101–111)
CO2: 29 mmol/L (ref 22–32)
Calcium: 8.2 mg/dL — ABNORMAL LOW (ref 8.9–10.3)
Creatinine, Ser: 1.19 mg/dL (ref 0.61–1.24)
GFR calc Af Amer: >60 mL/min (ref >60)
GFR, EST NON AFRICAN AMERICAN: 58 mL/min — AB (ref >60)
Glucose, Bld: 119 mg/dL — ABNORMAL HIGH (ref 65–99)
POTASSIUM: 4.9 mmol/L (ref 3.5–5.1)
SODIUM: 139 mmol/L (ref 135–145)

## 2017-09-23 NOTE — Evaluation (Signed)
Physical Therapy Evaluation Patient Details Name: [name] MRN: [id number] DOB: [birth year] Today's Date: 09/23/2017   History of Present Illness  [name] is a 72 y.o. male with medical history significant of diastolic CHF last EF 55 to 60%, A. fib on Xarelto, COPD, oxygen dependent on 3 L, and malignant lymphoplasmayctic lymphoma; who presents with complaints of weakness  Clinical Impression  The patient requires 2 assist for mobility and transfers. Patient is weak, complains of right scapular area pain. Pt admitted with above diagnosis. Pt currently with functional limitations due to the deficits listed below (see PT Problem List). Pt will benefit from skilled PT to increase their independence and safety with mobility to allow discharge to the venue listed below.       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    Mobility  Bed Mobility Overal bed mobility: Needs Assistance Bed Mobility: Supine to Sit     Supine to sit: Mod assist;+2 for physical assistance;+2 for safety/equipment     General bed mobility comments: extra time due to pain  Transfers Overall transfer level: Needs assistance Equipment used: Rolling walker (2 wheeled) Transfers: Sit to/from [facility] Sit to Stand: Max assist;+2 physical assistance Stand pivot transfers: Max assist;+2 physical assistance       General transfer comment: small shuffle steps to recliner, legs are buckling slightly.  Ambulation/Gait                Stairs            Wheelchair Mobility    Modified Rankin (Stroke Patients Only)       Balance                                             Pertinent Vitals/Pain Pain  Assessment: 0-10 Faces Pain Scale: Hurts worst Pain Location: R shoulder, buttocks, back Pain Descriptors / Indicators: Burning;Discomfort;Grimacing;Guarding;Moaning Pain Intervention(s): Limited activity within patient's tolerance;Premedicated before session    Home Living Family/patient expects to be discharged to:: Private residence Living Arrangements: Spouse/significant other Available Help at Discharge: Family Type of Home: House Home Access: Stairs to enter Entrance Stairs-Rails: Left Entrance Stairs-Number of Steps: 5 Home Layout: One level Home Equipment: Environmental consultant - 2 wheels;Bedside commode;Shower seat;Wheelchair - manual Additional Comments: Wife reports it was taking 3 people to get him in and out of the house when he could walk.    Prior Function Level of Independence: Needs assistance   Gait / Transfers Assistance Needed: Amb with RW and ambulating household distance with wife S/SBA            Hand Dominance        Extremity/Trunk Assessment   Upper Extremity Assessment Upper Extremity Assessment: Generalized weakness    Lower Extremity Assessment Lower Extremity Assessment: Generalized weakness    Cervical / Trunk Assessment Cervical / Trunk Assessment: Kyphotic  Communication   Communication: No difficulties  Cognition Arousal/Alertness: Awake/alert Behavior During Therapy: WFL for tasks assessed/performed Overall Cognitive Status: Within Functional Limits for tasks assessed  General Comments      Exercises     Assessment/Plan    PT Assessment Patient needs continued PT services  PT Problem List Decreased strength;Decreased range of motion;Decreased knowledge of use of DME;Decreased activity tolerance;Decreased knowledge of precautions;Decreased mobility;Cardiopulmonary status limiting activity;Pain       PT Treatment Interventions DME instruction;Therapeutic exercise;Gait  training;Functional mobility training;Therapeutic activities;Patient/family education    PT Goals (Current goals can be found in the Care Plan section)  Acute Rehab PT Goals Patient Stated Goal: per wife, to walk PT Goal Formulation: With patient/family Time For Goal Achievement: 10/07/17 Potential to Achieve Goals: Fair    Frequency Min 2X/week   Barriers to discharge        Co-evaluation               AM-PAC PT "6 Clicks" Daily Activity  Outcome Measure Difficulty turning over in bed (including adjusting bedclothes, sheets and blankets)?: Unable Difficulty moving from lying on back to sitting on the side of the bed? : Unable Difficulty sitting down on and standing up from a chair with arms (e.g., wheelchair, bedside commode, etc,.)?: Unable Help needed moving to and from a bed to chair (including a wheelchair)?: Total Help needed walking in hospital room?: Total Help needed climbing 3-5 steps with a railing? : Total 6 Click Score: 6    End of Session Equipment Utilized During Treatment: Gait belt Activity Tolerance: Patient limited by pain;Patient limited by fatigue Patient left: in chair;with call bell/phone within reach;with chair alarm set;with family/visitor present Nurse Communication: Mobility status PT Visit Diagnosis: Unsteadiness on feet (R26.81);Pain    Time: 4327-6147 PT Time Calculation (min) (ACUTE ONLY): 42 min   Charges:   PT Evaluation $PT Eval Moderate Complexity: 1 Mod PT Treatments $Therapeutic Activity: 23-37 mins   PT G CodesTresa Endo PT [phone]  Claretha Cooper 09/23/2017, 1:00 PM

## 2017-09-23 NOTE — Progress Notes (Addendum)
Progress Note  Patient Name: [name] Date of Encounter: 09/23/2017  Primary Cardiologist: [name], MD   Subjective   Reports he had a "bad night". Mostly due to chronic back pain and also had urinary retention, requiring foley placement.  Inpatient Medications    Scheduled Meds: . acidophilus  1 capsule Oral Daily  . acyclovir  400 mg Oral Daily  . amoxicillin-clavulanate  1 tablet Oral Q12H  . Chlorhexidine Gluconate Cloth  6 each Topical Daily  . feeding supplement  1 Container Oral TID WC  . feeding supplement (ENSURE ENLIVE)  237 mL Oral Q24H  . folic acid  1 mg Oral Daily  . furosemide  40 mg Intravenous BID  . miconazole nitrate   Topical BID  . midodrine  10 mg Oral TID WC  . potassium chloride SA  20 mEq Oral BID  . pregabalin  25 mg Oral BID  . rivaroxaban  20 mg Oral Q supper  . senna  1 tablet Oral Daily  . sodium chloride flush  10-40 mL Intracatheter Q12H  . sodium chloride flush  3 mL Intravenous Q12H   Continuous Infusions: . sodium chloride     PRN Meds: sodium chloride, acetaminophen, fentaNYL (SUBLIMAZE) injection, guaiFENesin-dextromethorphan, HYDROcodone-acetaminophen, morphine injection, ondansetron (ZOFRAN) IV, polyethylene glycol, simethicone, sodium chloride flush, sodium chloride flush   Vital Signs    Vitals:   09/22/17 2000 09/23/17 0000 09/23/17 0400 09/23/17 0800  BP:      Pulse:  86    Resp:  (!) 9    Temp: (!) 97.5 F (36.4 C) 97.8 F (36.6 C) 97.7 F (36.5 C) 97.8 F (36.6 C)  TempSrc: Oral Oral Oral Oral  SpO2:  100%    Weight:      Height:        Intake/Output Summary (Last 24 hours) at 09/23/2017 0916 Last data filed at 09/23/2017 0000 Gross per 24 hour  Intake -  Output 100 ml  Net -100 ml   Filed Weights   09/19/17 0500 09/20/17 1000 09/21/17 0451  Weight: 158 lb 8.2 oz (71.9 kg) 161 lb 2.5 oz (73.1 kg) 163 lb 2.3 oz (74 kg)    Telemetry    Atrial fibrillation in the 80s. - Personally  Reviewed  ECG    09/19/17, persistent atrial fibrillation, 72 bpm - Personally Reviewed  Physical Exam   GEN: frail appearing, looks weak Neck: No JVD Cardiac: irreguarlly irregular rhythm, regular rate Respiratory: Clear to auscultation bilaterally. GI: Soft, nontender, non-distended  MS: left hand edematous, right upper arm edematous, 2+ bilateral LEE/ ankle edema  Neuro:  Nonfocal  Psych: Normal affect   Labs    Chemistry Recent Labs  Lab 09/18/17 1718  09/21/17 0333 09/22/17 0500 09/23/17 0500  NA 136   < > 138 139 139  K 3.5   < > 4.6 4.7 4.9  CL 100*   < > 100* 100* 99*  CO2 26   < > _0 GLUCOSE 87   < > 116* 88 119*  BUN 28*   < > 37* 39* 44*  CREATININE 0.77   < > 1.10 1.10 1.19  CALCIUM 7.8*   < > 8.0* 8.2* 8.2*  PROT 4.4*  --   --   --   --   ALBUMIN 1.6*  --   --   --   --   AST 15  --   --   --   --  ALT 10*  --   --   --   --   ALKPHOS 273*  --   --   --   --   BILITOT 0.6  --   --   --   --   GFRNONAA >60   < > >60 >60 58*  GFRAA >60   < > >60 >60 >60  ANIONGAP 10   < > _0 < > = values in this interval not displayed.     Hematology Recent Labs  Lab 09/18/17 1718 09/19/17 0151 09/21/17 1442  WBC 10.3 9.1 6.4  RBC 2.85* 2.90*  2.89* 2.98*  HGB 9.6* 9.8* 10.0*  HCT 28.8* 29.7* 30.8*  MCV 101.1* 102.4* 103.4*  MCH 33.7 33.8 33.6  MCHC 33.3 33.0 32.5  RDW 15.8* 15.3 15.5  PLT 345 296 304    Cardiac Enzymes Recent Labs  Lab 09/18/17 1718 09/18/17 1951 09/19/17 0151 09/19/17 0834  TROPONINI 0.10* 0.10* 0.10* 0.09*   No results for input(s): TROPIPOC in the last 168 hours.   BNP Recent Labs  Lab 09/18/17 1718 09/22/17 0500  BNP 1,924.4* 1,282.2*     DDimer No results for input(s): DDIMER in the last 168 hours.   Radiology    Dg Chest 2 View  Result Date: 09/21/2017 CLINICAL DATA:  Follow-up pulmonary edema. EXAM: CHEST - 2 VIEW COMPARISON:  09/18/2017 FINDINGS: Cardiomegaly as seen previously. Redemonstration  of bilateral effusions with pulmonary atelectasis. Effusions may be larger. Pulmonary density appears worsened, particularly on the right. IMPRESSION: Radiographic worsening. Enlargement of effusions, particularly on the right. Worsened pulmonary density presumed represent worsened edema. Can't rule out pneumonia particularly on the right. Electronically Signed   By: Nelson Chimes M.D.   On: 09/21/2017 13:29   Dg Chest Port 1 View  Result Date: 09/22/2017 CLINICAL DATA:  Status post left-sided thoracentesis EXAM: PORTABLE CHEST 1 VIEW COMPARISON:  Chest radiograph Sep 21, 2017 FINDINGS: No evident pneumothorax. There is resolution of left pleural effusion following thoracentesis. There is a partially loculated right pleural effusion with consolidation throughout much of the right lung, with the greatest degree of concentration in the right mid lung, stable. There is a small calcified granuloma in the right upper lobe, stable. There is slight atelectasis in the medial left lung base. Left lung otherwise is clear. Heart is upper normal in size with pulmonary vascularity normal. No adenopathy. Port-A-Cath tip is in the superior vena cava. No evident bone lesions. IMPRESSION: Resolution of pleural effusion on the left without pneumothorax. Left lung clear except for slight left base atelectasis. Persistent partially loculated pleural effusion on the right with extensive consolidation throughout much of the right lung, stable. Stable cardiac silhouette. Port-A-Cath tip in superior vena cava near cavoatrial junction. Electronically Signed   By: Lowella Grip III M.D.   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 Request made for diagnostic and therapeutic left thoracentesis. EXAM: ULTRASOUND GUIDED  DIAGNOSTIC AND THERAPEUTIC LEFT THORACENTESIS MEDICATIONS: None COMPLICATIONS: None immediate. PROCEDURE: An ultrasound guided thoracentesis was thoroughly discussed with the patient and questions answered. The benefits, risks, alternatives and complications were also discussed. The patient understands and wishes to proceed with the procedure. Written consent was obtained. Ultrasound was performed to localize and mark an adequate pocket of fluid in the left chest. The area was then  prepped and draped in the normal sterile fashion. 1% Lidocaine was used for local anesthesia. Under ultrasound guidance a 6 Fr Safe-T-Centesis catheter was introduced. Thoracentesis was performed. The catheter was removed and a dressing applied. FINDINGS: A total of approximately 870 cc of light yellow fluid was removed. Samples were sent to the laboratory as requested by the clinical team. IMPRESSION: Successful ultrasound guided diagnostic and therapeutic left thoracentesis yielding 870 cc of pleural fluid. Read by: Rowe Robert, PA-C Electronically Signed   By: Jerilynn Mages.  Shick M.D.   On: 09/22/2017 14:45    Cardiac Studies   Echocardiogram 09/19/2017: Study Conclusions  - Procedure narrative: The echo was ended before completing the protocol due to the patients pain level due to chemo and radiation. - Left ventricle: The cavity size was normal. Wall thickness was increased in a pattern of moderate LVH. Systolic function was normal. The estimated ejection fraction was in the range of 60% to 65%. Wall motion was normal; there were no regional wall motion abnormalities. The study is not technically sufficient to allow evaluation of LV diastolic function. - Mitral valve: Calcified annulus. Mildly thickened leaflets . There was mild regurgitation. - Left atrium: Severely dilated. - Tricuspid valve: Mildly thickened leaflets. There was trivial regurgitation.  Impressions:  - Incomplete study  -discontinued due to patient discomfort. LVEF 60-65%, moderate LVH, mild MR, severe LAE, apperas in a-fib. There is a prominent left pleural effusion.   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   Assessment & Plan    1. Acute on Chronic Diastolic CHF: EF normal on echo, 60-65%. BNP 1,924 on admit. Treated with IV lasix and f/u BNP yesterday showed some improvement in level but remains elevated at 1,282. Continue IV Lasix. He had urinary retention but now has a foley.  Current recorded Net I/Os 3.6 L since admit but foley bag near full. Will continue to tract I/Os. He also has h/o hypoalbuminemia, 1.6 on admit, so suspect some of his edema is secondary to that.   2. 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 Aundra Dubin in [facility], diagnosed by renal biopsy.   3. Urinary Retention: bladder scan last night with 1500 cc. Foley placed. Hematuria noted but RN notes little resistance with foley placement. Further management per primary team. He is on Xarelto, so will need to monitor closely. May need f/u CBC to check H/H if hematuria persists.     4. Persistent Atrial Fibrillation: history of failed DCCV. Rate is controlled in the 80s on tele. Asymptomatic. On Xarelto for a/c.   5. Orthostatic Hypotension: BP improved today, compared to yesterday. SBP in the low 100s. Continue on Midodrine and use of compression stockings.  6. H/o Nephrotic Syndrome: suspect due to AL amyloidosis.   7. Chronic Back Pain: pt requesting pain meds. Will defer management to primary care team.   For questions or updates, please contact [place] Please consult [email or web address] for contact info under Cardiology/STEMI.      Signed, Lyda Jester, PA-C  09/23/2017, 9:16 AM    As above, patient seen and examined.  He denies dyspnea this morning.  No chest pain.  He is tolerating diuresis from a blood pressure standpoint.  As outlined in previous notes  I think his edema is more likely related to hypoalbuminemia from poor nutrition and nephrotic syndrome with smaller component from diastolic congestive heart failure.  Continue present dose of Lasix.  Follow renal function.  Continue midodrine and compression hose for hypotension.  Continue Xarelto but watch for continuing hematuria (likely related to foley placement).  Kirk Ruths, MD

## 2017-09-23 NOTE — Progress Notes (Signed)
PROGRESS NOTE    [name]  NAT:[id number] DOB: 04-18-1946 DOA: 09/18/2017 PCP: Biagio Borg, MD    Brief Narrative:  [name] is a 72 y.o. male with medical history significant of diastolic CHF last EF 55 to 60%, A. fib on Xarelto, COPD, oxygen dependent on 3 L, and malignant lymphoplasmayctic lymphoma; who presents with complaints of weakness over the last 2-3 days associated with swelling of the lower extremities and hands and severe pain in the back. He had chemo about 3 weeks ago and follows up with Dr Irene Limbo. Chest x-ray showing signs of congestive heart failure with pulmonary edema and pleural effusions.       Assessment & Plan:   Principal Problem:   Diastolic CHF ([place]) Active Problems:   COPD mixed type (HCC)   Hypotension   Malignant lymphoplasmacytic lymphoma (HCC)   Elevated troponin   Tinea cruris   Generalized weakness   Acute respiratory failure with hypoxia secondary to acute on chronic diastolic heart failure:  Admitted to step down and started him on IV lasix BID, diuresed about 3. 5 liters since admission. His breathing and pedal edema has improved. Strict intake and output and use ted hoses or compression stockings.  Monitor k and creatinine on lv lasix and hold lasix for hypotension.  Cardiology consulted and recommendations given.  Echocardiogram ordered and reviewed.  Patient's bp is low to aggressively diurese him.  Continues to require  upto 2 lit of [place] oxygen to keep sats greater than 90%.  Repeat CXR shows worsening aeration with pleural effusion on the right greater than left.  Thoracentesis done on 5/13 and about 870 ml of fluid aspiration from the left. Continue to wean him off the oxygen and work with physical therapy.     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  Will request Dr Irene Limbo for recommendations.    Chronic atrial  fibrillation  Rate controlled on xarelto. Hold xarelto if pt continues to have hematuria.    Anemia with  Iron and folate deficiency: Folate added.    H/o nephrotic syndrome Would explain the hypoalbuminemia.    H/o orthostatic hypotension on midodrine.  Keep MAP greater than 65. No change in medications.     Severe protein calorie malnutrition:  Nutrition consulted and recommendations given.    Chronic pain :  Unclear etiology. Will probably need referral to pain clinic on discharge.  Currently on lyrica, oxy and morphine.    H/o COPD: No wheezing heard. Resume duonebs as needed.    Elevated troponin: possibly from demand ischemia from CHF. Pt denies any chest pain .   H/o prostate cancer:  Follow up with oncology as recommended.   Pressure injury of the sacrum:  Wound care consult and recommendations given.   Hematuria and urinary retention:  Hematuria either from retention vs from traumatic catheter placement.  UA done, no signs of infection.  Urine is clearing up.    DVT prophylaxis: lovenox.  Code Status: full code.  Family Communication: wife at bedside.  Disposition Plan: will require SNF when stable for discharge.   Consultants:   cardology  Wound care consult.    Procedures: echocardiogram.   Antimicrobials: none.   Subjective: Reports had a rough night, as pt had trouble urinating .  Currently resting.   Objective: Vitals:   09/23/17 0800 09/23/17 0900 09/23/17 1000 09/23/17 1217  BP: 105/67 110/63 (!) 91/57 (!) 87/67  Pulse: 84 79  83 74  Resp: _0 Temp: 97.8 F (36.6 C)   97.7 F (36.5 C)  TempSrc: Oral   Oral  SpO2: 100% 100% 100% 99%  Weight:      Height:        Intake/Output Summary (Last 24 hours) at 09/23/2017 1249 Last data filed at 09/23/2017 0000 Gross per 24 hour  Intake -  Output 100 ml  Net -100 ml   Filed Weights   09/19/17 0500 09/20/17 1000 09/21/17 0451  Weight: 71.9 kg (158 lb 8.2 oz) 73.1 kg (161  lb 2.5 oz) 74 kg (163 lb 2.3 oz)    Examination:  General exam: Appears calm on 2 lit of [place] oxygen. No in distress.   Respiratory system: bilateral crackles improving, no wheezing or rhonchi. Air entry fair.  Cardiovascular system: S1 & S2 heard, irregular, No JVD,3 + pedal edema.  Gastrointestinal system: Abdomen is soft , nt non distended bowel sounds heard.  Central nervous system: Alert and oriented. Grossly non focal.  Extremities: pedal edema 3+ Skin: deep tissue injury over the sacral area Psychiatry:Mood & affect appropriate.     Data Reviewed: I have personally reviewed following labs and imaging studies  CBC: Recent Labs  Lab 09/18/17 1718 09/19/17 0151 09/21/17 1442  WBC 10.3 9.1 6.4  NEUTROABS 9.7* 8.4* 6.0  HGB 9.6* 9.8* 10.0*  HCT 28.8* 29.7* 30.8*  MCV 101.1* 102.4* 103.4*  PLT 345 296 482   Basic Metabolic Panel: Recent Labs  Lab 09/19/17 0151 09/20/17 0500 09/21/17 0333 09/22/17 0500 09/23/17 0500  NA 137 135 138 139 139  K 3.9 4.5 4.6 4.7 4.9  CL 101 97* 100* 100* 99*  CO2 _1 GLUCOSE 85 73 116* 88 119*  BUN 31* 33* 37* 39* 44*  CREATININE 0.90 0.96 1.10 1.10 1.19  CALCIUM 8.0* 8.1* 8.0* 8.2* 8.2*   GFR: Estimated Creatinine Clearance: 50.9 mL/min (by C-G formula based on SCr of 1.19 mg/dL). Liver Function Tests: Recent Labs  Lab 09/18/17 1718  AST 15  ALT 10*  ALKPHOS 273*  BILITOT 0.6  PROT 4.4*  ALBUMIN 1.6*   No results for input(s): LIPASE, AMYLASE in the last 168 hours. No results for input(s): AMMONIA in the last 168 hours. Coagulation Profile: Recent Labs  Lab 09/18/17 1718  INR 2.01   Cardiac Enzymes: Recent Labs  Lab 09/18/17 1718 09/18/17 1951 09/19/17 0151 09/19/17 0834  TROPONINI 0.10* 0.10* 0.10* 0.09*   BNP (last 3 results) No results for input(s): PROBNP in the last 8760 hours. HbA1C: No results for input(s): HGBA1C in the last 72 hours. CBG: No results for input(s): GLUCAP in the last 168  hours. Lipid Profile: No results for input(s): CHOL, HDL, LDLCALC, TRIG, CHOLHDL, LDLDIRECT in the last 72 hours. Thyroid Function Tests: No results for input(s): TSH, T4TOTAL, FREET4, T3FREE, THYROIDAB in the last 72 hours. Anemia Panel: No results for input(s): VITAMINB12, FOLATE, FERRITIN, TIBC, IRON, RETICCTPCT in the last 72 hours. Sepsis Labs: Recent Labs  Lab 09/18/17 1725  LATICACIDVEN 0.69    Recent Results (from the past 240 hour(s))  MRSA PCR Screening     Status: None   Collection Time: 09/18/17 10:25 PM  Result Value Ref Range Status   MRSA by PCR NEGATIVE NEGATIVE Final    Comment:        The GeneXpert MRSA Assay (FDA approved for NASAL specimens only), is one component of a comprehensive MRSA colonization surveillance program. It  is not intended to diagnose MRSA infection nor to guide or monitor treatment for MRSA infections. Performed at [facility], [place] [address] [place], [place] [address]   Culture, expectorated sputum-assessment     Status: None   Collection Time: 09/21/17  2:42 PM  Result Value Ref Range Status   Specimen Description SPUTUM  Final   Special Requests   Final    NONE Performed at [facility], [place] [address], [place], [place] [address]    Sputum evaluation THIS SPECIMEN IS ACCEPTABLE FOR SPUTUM CULTURE  Final   Report Status 09/22/2017 FINAL  Final  Culture, respiratory (NON-Expectorated)     Status: None (Preliminary result)   Collection Time: 09/21/17  2:42 PM  Result Value Ref Range Status   Specimen Description SPUTUM  Final   Special Requests NONE Reflexed from [id number]  Final   Gram Stain   Final    FEW WBC PRESENT, PREDOMINANTLY PMN ABUNDANT GRAM POSITIVE RODS FEW GRAM POSITIVE COCCI IN CLUSTERS FEW GRAM NEGATIVE RODS Performed at [facility], [place] [address] [place], [place] [address]    Culture PENDING  Incomplete   Report Status PENDING  Incomplete  Body fluid culture      Status: None (Preliminary result)   Collection Time: 09/22/17  2:41 PM  Result Value Ref Range Status   Specimen Description   Final    PLEURAL LEFT Performed at [facility] [address] [place], [place] [address]    Special Requests   Final    NONE Performed at [facility], [place] [address] [place],  [address]    Gram Stain   Final    RARE WBC PRESENT, PREDOMINANTLY PMN NO ORGANISMS SEEN    Culture   Final    NO GROWTH < 24 HOURS Performed at [facility] [address] [place],  [address]    Report Status PENDING  Incomplete         Radiology Studies: Dg Chest 2 View  Result Date: 09/21/2017 CLINICAL DATA:  Follow-up pulmonary edema. EXAM: CHEST - 2 VIEW COMPARISON:  09/18/2017 FINDINGS: Cardiomegaly as seen previously. Redemonstration of bilateral effusions with pulmonary atelectasis. Effusions may be larger. Pulmonary density appears worsened, particularly on the right. IMPRESSION: Radiographic worsening. Enlargement of effusions, particularly on the right. Worsened pulmonary density presumed represent worsened edema. Can't rule out pneumonia particularly on the right. Electronically Signed   By: Nelson Chimes M.D.   On: 09/21/2017 13:29   Dg Chest Port 1 View  Result Date: 09/22/2017 CLINICAL DATA:  Status post left-sided thoracentesis EXAM: PORTABLE CHEST 1 VIEW COMPARISON:  Chest radiograph Sep 21, 2017 FINDINGS: No evident pneumothorax. There is resolution of left pleural effusion following thoracentesis. There is a partially loculated right pleural effusion with consolidation throughout much of the right lung, with the greatest degree of concentration in the right mid lung, stable. There is a small calcified granuloma in the right upper lobe, stable. There is slight atelectasis in the medial left lung base. Left lung otherwise is clear. Heart is upper normal in size with pulmonary vascularity normal. No  adenopathy. Port-A-Cath tip is in the superior vena cava. No evident bone lesions. IMPRESSION: Resolution of pleural effusion on the left without pneumothorax. Left lung clear except for slight left base atelectasis. Persistent partially loculated pleural effusion on the right with extensive consolidation throughout much of the right lung, stable. Stable cardiac silhouette. Port-A-Cath tip in superior  vena cava near cavoatrial junction. Electronically Signed   By: Lowella Grip III M.D.   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 Request made for diagnostic and therapeutic left thoracentesis. EXAM: ULTRASOUND GUIDED DIAGNOSTIC AND THERAPEUTIC LEFT THORACENTESIS MEDICATIONS: None COMPLICATIONS: None immediate. PROCEDURE: An ultrasound guided thoracentesis was thoroughly discussed with the patient and questions answered. The benefits, risks, alternatives and complications were also discussed. The patient understands and wishes to proceed with the procedure. Written consent was obtained. Ultrasound was performed to localize and mark an adequate pocket of fluid in the left chest. The area was then prepped and draped in the normal sterile fashion. 1% Lidocaine was used for local anesthesia. Under ultrasound guidance a 6 Fr Safe-T-Centesis catheter was introduced. Thoracentesis was performed. The catheter was removed and a dressing applied. FINDINGS: A total of approximately 870 cc of light yellow fluid was removed. Samples were sent to the laboratory as requested by the clinical team. IMPRESSION: Successful ultrasound guided diagnostic and therapeutic left thoracentesis yielding 870 cc of pleural fluid. Read by: Rowe Robert, PA-C Electronically Signed   By: Jerilynn Mages.  Shick M.D.   On: 09/22/2017 14:45         Scheduled Meds: . acidophilus  1 capsule Oral Daily  . acyclovir  400 mg Oral Daily  . amoxicillin-clavulanate  1 tablet Oral Q12H  . Chlorhexidine Gluconate Cloth  6 each Topical Daily  . feeding supplement  1 Container Oral TID WC  . feeding supplement (ENSURE ENLIVE)  237 mL Oral Q24H  . folic acid  1 mg Oral Daily  . furosemide  40 mg Intravenous BID  . miconazole nitrate   Topical BID  . midodrine  10 mg Oral TID WC  . potassium chloride SA  20 mEq Oral BID  . pregabalin  25 mg Oral BID  . rivaroxaban  20 mg Oral Q supper  . senna  1 tablet Oral Daily  . sodium chloride flush  10-40 mL Intracatheter Q12H  . sodium chloride flush  3 mL Intravenous Q12H   Continuous Infusions: . sodium chloride       LOS: 5 days    Time spent: 35 mintues.     Hosie Poisson, MD [facility] Pager [phone]   If 7PM-7AM, please contact night-coverage [email or web address] Password [facility] 09/23/2017, 12:49 PM

## 2017-09-23 NOTE — Telephone Encounter (Signed)
Lab was canceled do to patient in hospital. Per 5/14 return message

## 2017-09-23 NOTE — Consult Note (Signed)
[place] Nurse wound follow up Was last seen 09/19/17.  No change in skin.  Fecal manager in place to promote comfort in patient who has intense pain with ADLs.  Will order mattress with low air loss feature to offload pressure.  Wound type: Abrasions to back.  Stage 2 pressure injury to sacrum Measurement:  See previous note No changes in care.  Will not follow at this time.  Please re-consult if needed.  Domenic Moras RN BSN [place] Pager [phone]

## 2017-09-24 DIAGNOSIS — Z9889 Other specified postprocedural states: Secondary | ICD-10-CM

## 2017-09-24 DIAGNOSIS — L89152 Pressure ulcer of sacral region, stage 2: Secondary | ICD-10-CM

## 2017-09-24 DIAGNOSIS — J90 Pleural effusion, not elsewhere classified: Secondary | ICD-10-CM

## 2017-09-24 LAB — CULTURE, RESPIRATORY

## 2017-09-24 LAB — CBC WITH DIFFERENTIAL/PLATELET
Basophils Absolute: 0 10*3/uL (ref 0.0–0.1)
Basophils Relative: 0 %
EOS ABS: 0 10*3/uL (ref 0.0–0.7)
EOS PCT: 0 %
HCT: 27.8 % — ABNORMAL LOW (ref 39.0–52.0)
Hemoglobin: 8.9 g/dL — ABNORMAL LOW (ref 13.0–17.0)
LYMPHS ABS: 0.2 10*3/uL — AB (ref 0.7–4.0)
Lymphocytes Relative: 3 %
MCH: 32.7 pg (ref 26.0–34.0)
MCHC: 32 g/dL (ref 30.0–36.0)
MCV: 102.2 fL — ABNORMAL HIGH (ref 78.0–100.0)
MONO ABS: 0.2 10*3/uL (ref 0.1–1.0)
Monocytes Relative: 3 %
Neutro Abs: 6.1 10*3/uL (ref 1.7–7.7)
Neutrophils Relative %: 94 %
PLATELETS: 243 10*3/uL (ref 150–400)
RBC: 2.72 MIL/uL — AB (ref 4.22–5.81)
RDW: 15.6 % — AB (ref 11.5–15.5)
WBC: 6.4 10*3/uL (ref 4.0–10.5)

## 2017-09-24 LAB — BASIC METABOLIC PANEL
Anion gap: 10 (ref 5–15)
BUN: 43 mg/dL — AB (ref 6–20)
CO2: 31 mmol/L (ref 22–32)
CREATININE: 1.14 mg/dL (ref 0.61–1.24)
Calcium: 8.3 mg/dL — ABNORMAL LOW (ref 8.9–10.3)
Chloride: 98 mmol/L — ABNORMAL LOW (ref 101–111)
GFR calc Af Amer: >60 mL/min (ref >60)
Glucose, Bld: 77 mg/dL (ref 65–99)
Potassium: 4.6 mmol/L (ref 3.5–5.1)
SODIUM: 139 mmol/L (ref 135–145)

## 2017-09-24 LAB — MAGNESIUM: MAGNESIUM: 1.4 mg/dL — AB (ref 1.7–2.4)

## 2017-09-24 LAB — CULTURE, RESPIRATORY W GRAM STAIN: Culture: NORMAL

## 2017-09-24 MED ORDER — SENNOSIDES-DOCUSATE SODIUM 8.6-50 MG PO TABS
1.0000 | ORAL_TABLET | Freq: Two times a day (BID) | ORAL | Status: DC
  Administered 2017-09-24 – 2017-09-26 (×5): 1 via ORAL
  Filled 2017-09-24 (×4): qty 1

## 2017-09-24 MED ORDER — LIDOCAINE 5 % EX PTCH
1.0000 | MEDICATED_PATCH | CUTANEOUS | Status: DC
  Administered 2017-09-24 – 2017-09-25 (×2): 1 via TRANSDERMAL
  Filled 2017-09-24 (×4): qty 1

## 2017-09-24 MED ORDER — PREGABALIN 50 MG PO CAPS
50.0000 mg | ORAL_CAPSULE | Freq: Two times a day (BID) | ORAL | Status: DC
  Administered 2017-09-24 – 2017-09-26 (×5): 50 mg via ORAL
  Filled 2017-09-24 (×5): qty 1

## 2017-09-24 MED ORDER — MAGNESIUM SULFATE 4 GM/100ML IV SOLN
4.0000 g | Freq: Once | INTRAVENOUS | Status: AC
  Administered 2017-09-24: 4 g via INTRAVENOUS
  Filled 2017-09-24: qty 100

## 2017-09-24 MED ORDER — PREGABALIN 25 MG PO CAPS
25.0000 mg | ORAL_CAPSULE | Freq: Once | ORAL | Status: AC
  Administered 2017-09-24: 25 mg via ORAL

## 2017-09-24 MED ORDER — MORPHINE SULFATE 15 MG PO TABS
15.0000 mg | ORAL_TABLET | ORAL | Status: DC | PRN
  Administered 2017-09-24 – 2017-09-26 (×4): 15 mg via ORAL
  Filled 2017-09-24 (×5): qty 1

## 2017-09-24 NOTE — Progress Notes (Signed)
Physical Therapy Treatment Patient Details Name: SAVION WASHAM MRN: [id number] DOB: 10/09/1945 Today's Date: 09/24/2017    History of Present Illness EDGE MAUGER is a 72 y.o. male with medical history significant of diastolic CHF last EF 55 to 60%, A. fib on Xarelto, COPD, oxygen dependent on 3 L, and malignant lymphoplasmayctic lymphoma; who presents with complaints of weakness    PT Comments    Pt continues cooperative but progress ltd by continued fatigue and pain.     Follow Up Recommendations  SNF     Equipment Recommendations  None recommended by PT    Recommendations for Other Services       Precautions / Restrictions Precautions Precautions: Fall Precaution Comments: significant c/o back bain, monitor VS, BP low Restrictions Weight Bearing Restrictions: No    Mobility  Bed Mobility Overal bed mobility: Needs Assistance Bed Mobility: Supine to Sit;Sit to Supine     Supine to sit: Mod assist;Max assist;+2 for physical assistance;+2 for safety/equipment Sit to supine: Max assist;+2 for physical assistance;+2 for safety/equipment   General bed mobility comments: extra time due to pain; utilized pad to assist pt to from EOB,  Physical assist to manage LEs and to control trunk  Transfers                 General transfer comment: NT, pt sat at side of bed only but declined to attempt standing 2* fatigue/pain  Ambulation/Gait                 Stairs             Wheelchair Mobility    Modified Rankin (Stroke Patients Only)       Balance Overall balance assessment: Needs assistance Sitting-balance support: No upper extremity supported;Feet supported Sitting balance-Leahy Scale: Fair                                      Cognition Arousal/Alertness: Awake/alert Behavior During Therapy: WFL for tasks assessed/performed Overall Cognitive Status: Within Functional Limits for tasks assessed                                         Exercises      General Comments        Pertinent Vitals/Pain Pain Assessment: 0-10 Faces Pain Scale: Hurts whole lot Pain Location: R shoulder, buttocks, back, feet Pain Descriptors / Indicators: Burning;Discomfort;Grimacing;Guarding;Moaning Pain Intervention(s): Limited activity within patient's tolerance;Monitored during session(Pain meds ltd by low BP)    Home Living Family/patient expects to be discharged to:: Private residence Living Arrangements: Spouse/significant other Available Help at Discharge: Family         Home Equipment: Gilford Rile - 2 wheels;Bedside commode;Shower seat;Wheelchair - manual      Prior Function            PT Goals (current goals can now be found in the care plan section) Acute Rehab PT Goals Patient Stated Goal: per wife, to walk PT Goal Formulation: With patient/family Time For Goal Achievement: 10/07/17 Potential to Achieve Goals: Fair Progress towards PT goals: Not progressing toward goals - comment(Pain ltd)    Frequency    Min 2X/week      PT Plan Current plan remains appropriate    Co-evaluation PT/OT/SLP Co-Evaluation/Treatment: Yes Reason for Co-Treatment: For patient/therapist safety PT goals  addressed during session: Mobility/safety with mobility OT goals addressed during session: ADL's and self-care      AM-PAC PT "6 Clicks" Daily Activity  Outcome Measure  Difficulty turning over in bed (including adjusting bedclothes, sheets and blankets)?: Unable Difficulty moving from lying on back to sitting on the side of the bed? : Unable Difficulty sitting down on and standing up from a chair with arms (e.g., wheelchair, bedside commode, etc,.)?: Unable Help needed moving to and from a bed to chair (including a wheelchair)?: Total Help needed walking in hospital room?: Total Help needed climbing 3-5 steps with a railing? : Total 6 Click Score: 6    End of Session   Activity Tolerance:  Patient limited by pain;Patient limited by fatigue Patient left: in bed;with call bell/phone within reach;with nursing/sitter in room Nurse Communication: Mobility status PT Visit Diagnosis: Unsteadiness on feet (R26.81);Pain;Difficulty in walking, not elsewhere classified (R26.2)     Time: 1109-1140 PT Time Calculation (min) (ACUTE ONLY): 31 min  Charges:  $Therapeutic Activity: 8-22 mins                    G Codes:       Pg [phone]    Lamyah Creed 09/24/2017, 12:27 PM

## 2017-09-24 NOTE — Care Management Important Message (Signed)
Important Message  Patient Details  Name: [name] MRN: [id number] Date of Birth: Feb 23, 1946   Medicare Important Message Given:  Yes    Kerin Salen 09/24/2017, 10:33 AMImportant Message  Patient Details  Name: [name] MRN: [id number] Date of Birth: 17-Sep-1945   Medicare Important Message Given:  Yes    Kerin Salen 09/24/2017, 10:33 AM

## 2017-09-24 NOTE — Progress Notes (Signed)
PROGRESS NOTE    [name]  JKK:[id number] DOB: [birth year] DOA: 09/18/2017 PCP: Biagio Borg, MD    Brief Narrative:  [name] a 72 y.o.malewith medical history significant ofdiastolic CHF last EF 55 to 60%, A. fib on Xarelto, COPD, oxygen dependent on 3 L, and malignant lymphoplasmaycticlymphoma; whopresents with complaints of weakness over the last2-3 days associated with swelling of the lower extremities and hands and severe pain in the back. He had chemo about 3 weeks ago and follows up with Dr Irene Limbo. Chest x-ray showing signs of congestive heart failure with pulmonary edema and pleural effusions.      Assessment & Plan:   Principal Problem:   Diastolic CHF ([facility] [place]) Active Problems:   COPD mixed type (HCC)   Hypotension   Malignant lymphoplasmacytic lymphoma (HCC)   Elevated troponin   Tinea cruris   Generalized weakness   Pressure injury of sacral region, stage 2  #1 acute on chronic diastolic heart failure Questionable etiology.  Will need to obtain were somewhat elevated however remain plateaued likely not secondary to acute coronary syndrome.  2D echo with EF of 60 to 65%, no wall motion abnormalities, left pleural effusion.  Patient on IV diuretics with good urine output.  Patient with a urine output of 4.25 L over the past 24 hours.  Patient is -7.6 L during this hospitalization.  Continue current dose of IV Lasix per cardiology.  Per cardiology.  2.  Chronic hypotension Continue Midrin.  3.  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  4.  Persistent atrial fibrillation Status post history of failed DCCV.  Rate currently controlled.  Xarelto for anticoagulation.  5.  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  Outpatient follow-up.  6.  Chronic pain Patient with complaints of chronic pain.  Resume home regimen of Lidoderm.  Increase Lyrica to 50 mg daily.  Discontinue IV fentanyl.  Discontinue  Norco.  Placed on MSIR every 4 hours as needed breakthrough pain.  Continue IV morphine as needed.  7.  Severe protein calorie malnutrition Continue nutritional supplementation.  Dietitian following.  8.  History of COPD Stable.  Duo nebs as needed.  9.  Anemia with iron and folate deficiency Continue folic acid.  10.  Pressure injury of the sacrum Continue current wound care.  11.  Hematuria and urinary retention Hematuria likely from urinary retention and possible traumatic Foley catheter placement.  Urinalysis unremarkable.  Urine clearing up.  Monitor closely on Xarelto.  12.  Left pleural effusion Status post thoracentesis 09/22/2017.  Continue diuretics.  Follow.   DVT prophylaxis: Xarelto Code Status: Full Family Communication: Updated patient and wife at bedside. Disposition Plan: Home with home health versus skilled nursing facility.   Consultants:   Cardiology: Dr.Croitoru 09/19/2017  [place] nurse Val Riles, RN  Procedures  Chest x-ray 09/23/2017, 09/22/2017, 09/21/2017, 09/18/2017  2D echo 09/19/2017  Antimicrobials:   Augmentin 09/21/2017     Subjective: Patient morning complaining of pain diffusely which is chronic.  No chest pain.  Shortness of breath improving.    Objective: Vitals:   09/23/17 2130 09/23/17 2357 09/24/17 0448 09/24/17 0502  BP: (!) 86/55 (!) 96/56 94/62   Pulse: 84 72 87   Resp: 19  15   Temp: 97.8 F (36.6 C)  97.7 F (36.5 C)   TempSrc: Oral  Oral   SpO2: 95% 96% 95%   Weight:    71.2 kg (157 lb)  Height:  Intake/Output Summary (Last 24 hours) at 09/24/2017 1232 Last data filed at 09/24/2017 0600 Gross per 24 hour  Intake 170 ml  Output 2750 ml  Net -2580 ml   Filed Weights   09/20/17 1000 09/21/17 0451 09/24/17 0502  Weight: 73.1 kg (161 lb 2.5 oz) 74 kg (163 lb 2.3 oz) 71.2 kg (157 lb)    Examination:  General exam: Likely ill-appearing.  Cachectic.  Respiratory system: Coarse breath sounds anterior lung  fields.  No wheezing.  Respiratory effort normal. Cardiovascular system: Irregularly irregular.  1+ bilateral lower extremity edema.  Lower extremities wrapped.  Gastrointestinal system: Abdomen is nondistended, soft and nontender. No organomegaly or masses felt. Normal bowel sounds heard. Central nervous system: Alert and oriented. No focal neurological deficits. Extremities: Symmetric 5 x 5 power. Skin: No rashes, lesions or ulcers Psychiatry: Judgement and insight appear normal. Mood & affect appropriate.     Data Reviewed: I have personally reviewed following labs and imaging studies  CBC: Recent Labs  Lab 09/18/17 1718 09/19/17 0151 09/21/17 1442 09/24/17 0945  WBC 10.3 9.1 6.4 6.4  NEUTROABS 9.7* 8.4* 6.0 6.1  HGB 9.6* 9.8* 10.0* 8.9*  HCT 28.8* 29.7* 30.8* 27.8*  MCV 101.1* 102.4* 103.4* 102.2*  PLT 345 296 304 542   Basic Metabolic Panel: Recent Labs  Lab 09/20/17 0500 09/21/17 0333 09/22/17 0500 09/23/17 0500 09/24/17 0945  NA 135 138 139 139 139  K 4.5 4.6 4.7 4.9 4.6  CL 97* 100* 100* 99* 98*  CO2 _0 GLUCOSE 73 116* 88 119* 77  BUN 33* 37* 39* 44* 43*  CREATININE 0.96 1.10 1.10 1.19 1.14  CALCIUM 8.1* 8.0* 8.2* 8.2* 8.3*  MG  --   --   --   --  1.4*   GFR: Estimated Creatinine Clearance: 53.2 mL/min (by C-G formula based on SCr of 1.14 mg/dL). Liver Function Tests: Recent Labs  Lab 09/18/17 1718  AST 15  ALT 10*  ALKPHOS 273*  BILITOT 0.6  PROT 4.4*  ALBUMIN 1.6*   No results for input(s): LIPASE, AMYLASE in the last 168 hours. No results for input(s): AMMONIA in the last 168 hours. Coagulation Profile: Recent Labs  Lab 09/18/17 1718  INR 2.01   Cardiac Enzymes: Recent Labs  Lab 09/18/17 1718 09/18/17 1951 09/19/17 0151 09/19/17 0834  TROPONINI 0.10* 0.10* 0.10* 0.09*   BNP (last 3 results) No results for input(s): PROBNP in the last 8760 hours. HbA1C: No results for input(s): HGBA1C in the last 72 hours. CBG: No  results for input(s): GLUCAP in the last 168 hours. Lipid Profile: No results for input(s): CHOL, HDL, LDLCALC, TRIG, CHOLHDL, LDLDIRECT in the last 72 hours. Thyroid Function Tests: No results for input(s): TSH, T4TOTAL, FREET4, T3FREE, THYROIDAB in the last 72 hours. Anemia Panel: No results for input(s): VITAMINB12, FOLATE, FERRITIN, TIBC, IRON, RETICCTPCT in the last 72 hours. Sepsis Labs: Recent Labs  Lab 09/18/17 1725  LATICACIDVEN 0.69    Recent Results (from the past 240 hour(s))  MRSA PCR Screening     Status: None   Collection Time: 09/18/17 10:25 PM  Result Value Ref Range Status   MRSA by PCR NEGATIVE NEGATIVE Final    Comment:        The GeneXpert MRSA Assay (FDA approved for NASAL specimens only), is one component of a comprehensive MRSA colonization surveillance program. It is not intended to diagnose MRSA infection nor to guide or monitor treatment for MRSA infections. Performed at [facility]  [place] [address] [place], [place] [address]   Culture, expectorated sputum-assessment     Status: None   Collection Time: 09/21/17  2:42 PM  Result Value Ref Range Status   Specimen Description SPUTUM  Final   Special Requests   Final    NONE Performed at [facility], [place] [address] [place], [place] [address]    Sputum evaluation THIS SPECIMEN IS ACCEPTABLE FOR SPUTUM CULTURE  Final   Report Status 09/22/2017 FINAL  Final  Culture, respiratory (NON-Expectorated)     Status: None   Collection Time: 09/21/17  2:42 PM  Result Value Ref Range Status   Specimen Description SPUTUM  Final   Special Requests NONE Reflexed from [id number]  Final   Gram Stain   Final    FEW WBC PRESENT, PREDOMINANTLY PMN ABUNDANT GRAM POSITIVE RODS FEW GRAM POSITIVE COCCI IN CLUSTERS FEW GRAM NEGATIVE RODS    Culture   Final    Consistent with normal respiratory flora. Performed at [facility], [place] [address] [place], [place] [address]     Report Status 09/24/2017 FINAL  Final  Body fluid culture     Status: None (Preliminary result)   Collection Time: 09/22/17  2:41 PM  Result Value Ref Range Status   Specimen Description   Final    PLEURAL LEFT Performed at [facility] [address], [place], [place] [address]    Special Requests   Final    NONE Performed at [facility], [place] [address] [place], [place] [address]    Gram Stain   Final    RARE WBC PRESENT, PREDOMINANTLY PMN NO ORGANISMS SEEN    Culture   Final    NO GROWTH 2 DAYS Performed at [facility] [address], [place], [place] [address]    Report Status PENDING  Incomplete         Radiology Studies: Dg Chest Port 1 View  Result Date: 09/23/2017 CLINICAL DATA:  Short of breath EXAM: PORTABLE CHEST 1 VIEW COMPARISON:  09/22/2017 FINDINGS: Extensive airspace disease in the right lung is unchanged. Right pleural effusion unchanged Progression of airspace disease in the left lung most notably in the left lung base. Interval development of small left effusion. Port-A-Cath tip in the SVC. IMPRESSION: Stable airspace disease and pleural effusion on the right Progression of airspace disease and pleural effusion on the left. Findings may be due to congestive heart failure versus pneumonia. Electronically Signed   By: Franchot Gallo M.D.   On: 09/23/2017 19:48   Dg Chest Port 1 View  Result Date: 09/22/2017 CLINICAL DATA:  Status post left-sided thoracentesis EXAM: PORTABLE CHEST 1 VIEW COMPARISON:  Chest radiograph Sep 21, 2017 FINDINGS: No evident pneumothorax. There is resolution of left pleural effusion following thoracentesis. There is a partially loculated right pleural effusion with consolidation throughout much of the right lung, with the greatest degree of concentration in the right mid lung, stable. There is a small calcified granuloma in the right upper lobe, stable. There is slight atelectasis in the medial  left lung base. Left lung otherwise is clear. Heart is upper normal in size with pulmonary vascularity normal. No adenopathy. Port-A-Cath tip is in the superior vena cava. No evident bone lesions. IMPRESSION: Resolution of pleural effusion on the left without pneumothorax. Left lung clear except for slight left base atelectasis. Persistent partially loculated pleural effusion on the right with extensive consolidation throughout much of the  right lung, stable. Stable cardiac silhouette. Port-A-Cath tip in superior vena cava near cavoatrial junction. Electronically Signed   By: Lowella Grip III M.D.   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 Request made for diagnostic and therapeutic left thoracentesis. EXAM: ULTRASOUND GUIDED DIAGNOSTIC AND THERAPEUTIC LEFT THORACENTESIS MEDICATIONS: None COMPLICATIONS: None immediate. PROCEDURE: An ultrasound guided thoracentesis was thoroughly discussed with the patient and questions answered. The benefits, risks, alternatives and complications were also discussed. The patient understands and wishes to proceed with the procedure. Written consent was obtained. Ultrasound was performed to localize and mark an adequate pocket of fluid in the left chest. The area was then prepped and draped in the normal sterile fashion. 1% Lidocaine was used for local anesthesia. Under ultrasound guidance a 6 Fr Safe-T-Centesis catheter was introduced. Thoracentesis was performed. The catheter was removed and a dressing applied. FINDINGS: A total of approximately 870 cc of light yellow fluid was removed. Samples were sent to the laboratory as requested by the clinical team. IMPRESSION: Successful ultrasound guided diagnostic and therapeutic left thoracentesis yielding 870 cc of pleural  fluid. Read by: Rowe Robert, PA-C Electronically Signed   By: Jerilynn Mages.  Shick M.D.   On: 09/22/2017 14:45        Scheduled Meds: . acidophilus  1 capsule Oral Daily  . acyclovir  400 mg Oral Daily  . amoxicillin-clavulanate  1 tablet Oral Q12H  . Chlorhexidine Gluconate Cloth  6 each Topical Daily  . feeding supplement  1 Container Oral TID WC  . feeding supplement (ENSURE ENLIVE)  237 mL Oral Q24H  . folic acid  1 mg Oral Daily  . furosemide  40 mg Intravenous BID  . miconazole nitrate   Topical BID  . midodrine  10 mg Oral TID WC  . potassium chloride SA  20 mEq Oral BID  . pregabalin  25 mg Oral BID  . rivaroxaban  20 mg Oral Q supper  . senna  1 tablet Oral Daily  . sodium chloride flush  10-40 mL Intracatheter Q12H  . sodium chloride flush  3 mL Intravenous Q12H   Continuous Infusions: . sodium chloride    . magnesium sulfate 1 - 4 g bolus IVPB       LOS: 6 days    Time spent: 35 mins    Irine Seal, MD [facility] Pager [phone]  If 7PM-7AM, please contact night-coverage [email or web address] Password [facility] 09/24/2017, 12:32 PM

## 2017-09-24 NOTE — Progress Notes (Signed)
Progress Note  Patient Name: [name] Date of Encounter: 09/24/2017  Primary Cardiologist: [name], MD   Subjective   Complains of shoulder pain and mild dyspnea; no chest pain  Inpatient Medications    Scheduled Meds: . acidophilus  1 capsule Oral Daily  . acyclovir  400 mg Oral Daily  . amoxicillin-clavulanate  1 tablet Oral Q12H  . Chlorhexidine Gluconate Cloth  6 each Topical Daily  . feeding supplement  1 Container Oral TID WC  . feeding supplement (ENSURE ENLIVE)  237 mL Oral Q24H  . folic acid  1 mg Oral Daily  . furosemide  40 mg Intravenous BID  . miconazole nitrate   Topical BID  . midodrine  10 mg Oral TID WC  . potassium chloride SA  20 mEq Oral BID  . pregabalin  25 mg Oral BID  . rivaroxaban  20 mg Oral Q supper  . senna  1 tablet Oral Daily  . sodium chloride flush  10-40 mL Intracatheter Q12H  . sodium chloride flush  3 mL Intravenous Q12H   Continuous Infusions: . sodium chloride     PRN Meds: sodium chloride, acetaminophen, fentaNYL (SUBLIMAZE) injection, guaiFENesin-dextromethorphan, HYDROcodone-acetaminophen, morphine injection, ondansetron (ZOFRAN) IV, polyethylene glycol, simethicone, sodium chloride flush, sodium chloride flush   Vital Signs    Vitals:   09/23/17 2130 09/23/17 2357 09/24/17 0448 09/24/17 0502  BP: (!) 86/55 (!) 96/56 94/62   Pulse: 84 72 87   Resp: 19  15   Temp: 97.8 F (36.6 C)  97.7 F (36.5 C)   TempSrc: Oral  Oral   SpO2: 95% 96% 95%   Weight:    157 lb (71.2 kg)  Height:        Intake/Output Summary (Last 24 hours) at 09/24/2017 0923 Last data filed at 09/24/2017 0600 Gross per 24 hour  Intake 170 ml  Output 2750 ml  Net -2580 ml   Filed Weights   09/20/17 1000 09/21/17 0451 09/24/17 0502  Weight: 161 lb 2.5 oz (73.1 kg) 163 lb 2.3 oz (74 kg) 157 lb (71.2 kg)    Telemetry    Atrial fibrillation; rate controlled - Personally Reviewed  Physical Exam   GEN: WD frail appearing, NAD Neck:  supple Cardiac: irreguar Respiratory: Diminished BS bases and mild rhonchi GI: Soft, no masses MS: 1+ edema; lower ext wrapped Neuro:  grossly intact Psych: Normal  Labs    Chemistry Recent Labs  Lab 09/18/17 1718  09/21/17 0333 09/22/17 0500 09/23/17 0500  NA 136   < > 138 139 139  K 3.5   < > 4.6 4.7 4.9  CL 100*   < > 100* 100* 99*  CO2 26   < > _0 GLUCOSE 87   < > 116* 88 119*  BUN 28*   < > 37* 39* 44*  CREATININE 0.77   < > 1.10 1.10 1.19  CALCIUM 7.8*   < > 8.0* 8.2* 8.2*  PROT 4.4*  --   --   --   --   ALBUMIN 1.6*  --   --   --   --   AST 15  --   --   --   --   ALT 10*  --   --   --   --   ALKPHOS 273*  --   --   --   --   BILITOT 0.6  --   --   --   --  GFRNONAA >60   < > >60 >60 58*  GFRAA >60   < > >60 >60 >60  ANIONGAP 10   < > _0 < > = values in this interval not displayed.     Hematology Recent Labs  Lab 09/18/17 1718 09/19/17 0151 09/21/17 1442  WBC 10.3 9.1 6.4  RBC 2.85* 2.90*  2.89* 2.98*  HGB 9.6* 9.8* 10.0*  HCT 28.8* 29.7* 30.8*  MCV 101.1* 102.4* 103.4*  MCH 33.7 33.8 33.6  MCHC 33.3 33.0 32.5  RDW 15.8* 15.3 15.5  PLT 345 296 304    Cardiac Enzymes Recent Labs  Lab 09/18/17 1718 09/18/17 1951 09/19/17 0151 09/19/17 0834  TROPONINI 0.10* 0.10* 0.10* 0.09*   BNP Recent Labs  Lab 09/18/17 1718 09/22/17 0500  BNP 1,924.4* 1,282.2*     Radiology    Dg Chest Port 1 View  Result Date: 09/23/2017 CLINICAL DATA:  Short of breath EXAM: PORTABLE CHEST 1 VIEW COMPARISON:  09/22/2017 FINDINGS: Extensive airspace disease in the right lung is unchanged. Right pleural effusion unchanged Progression of airspace disease in the left lung most notably in the left lung base. Interval development of small left effusion. Port-A-Cath tip in the SVC. IMPRESSION: Stable airspace disease and pleural effusion on the right Progression of airspace disease and pleural effusion on the left. Findings may be due to congestive heart  failure versus pneumonia. Electronically Signed   By: Franchot Gallo M.D.   On: 09/23/2017 19:48   Dg Chest Port 1 View  Result Date: 09/22/2017 CLINICAL DATA:  Status post left-sided thoracentesis EXAM: PORTABLE CHEST 1 VIEW COMPARISON:  Chest radiograph Sep 21, 2017 FINDINGS: No evident pneumothorax. There is resolution of left pleural effusion following thoracentesis. There is a partially loculated right pleural effusion with consolidation throughout much of the right lung, with the greatest degree of concentration in the right mid lung, stable. There is a small calcified granuloma in the right upper lobe, stable. There is slight atelectasis in the medial left lung base. Left lung otherwise is clear. Heart is upper normal in size with pulmonary vascularity normal. No adenopathy. Port-A-Cath tip is in the superior vena cava. No evident bone lesions. IMPRESSION: Resolution of pleural effusion on the left without pneumothorax. Left lung clear except for slight left base atelectasis. Persistent partially loculated pleural effusion on the right with extensive consolidation throughout much of the right lung, stable. Stable cardiac silhouette. Port-A-Cath tip in superior vena cava near cavoatrial junction. Electronically Signed   By: Lowella Grip III M.D.   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 Request made for diagnostic and therapeutic left thoracentesis. EXAM: ULTRASOUND GUIDED DIAGNOSTIC AND THERAPEUTIC LEFT THORACENTESIS MEDICATIONS: None COMPLICATIONS: None immediate. PROCEDURE: An ultrasound guided thoracentesis was thoroughly discussed with the patient and questions answered. The benefits, risks, alternatives and complications were also discussed. The patient understands and wishes to  proceed with the procedure. Written consent was obtained. Ultrasound was performed to localize and mark an adequate pocket of fluid in the left chest. The area was then prepped and draped in the normal sterile fashion. 1% Lidocaine was used for local anesthesia. Under ultrasound guidance a 6 Fr Safe-T-Centesis catheter was introduced. Thoracentesis was performed. The catheter was removed and a dressing applied. FINDINGS: A total of approximately 870 cc of light yellow fluid was  removed. Samples were sent to the laboratory as requested by the clinical team. IMPRESSION: Successful ultrasound guided diagnostic and therapeutic left thoracentesis yielding 870 cc of pleural fluid. Read by: Rowe Robert, PA-C Electronically Signed   By: Jerilynn Mages.  Shick M.D.   On: 09/22/2017 14:45    Cardiac Studies   Echocardiogram 09/19/2017: Study Conclusions  - Procedure narrative: The echo was ended before completing the protocol due to the patients pain level due to chemo and radiation. - Left ventricle: The cavity size was normal. Wall thickness was increased in a pattern of moderate LVH. Systolic function was normal. The estimated ejection fraction was in the range of 60% to 65%. Wall motion was normal; there were no regional wall motion abnormalities. The study is not technically sufficient to allow evaluation of LV diastolic function. - Mitral valve: Calcified annulus. Mildly thickened leaflets . There was mild regurgitation. - Left atrium: Severely dilated. - Tricuspid valve: Mildly thickened leaflets. There was trivial regurgitation.  Impressions:  - Incomplete study -discontinued due to patient discomfort. LVEF 60-65%, moderate LVH, mild MR, severe LAE, apperas in a-fib. There is a prominent left pleural effusion.   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   Assessment & Plan    1. Acute on Chronic Diastolic CHF: E/H-6314. Weight 157.  Most of anasarca is felt secondary to decreased albumin/low oncotic pressure.  Continue present dose of Lasix.  His blood pressure appears to be tolerating.  May be able to change to oral Lasix tomorrow.  Follow renal function.  EF normal on echo, 60-65%.   2. 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   3. Persistent Atrial Fibrillation: history of failed DCCV. Rate is controlled on no medications. Continue xarelto.   4. Orthostatic Hypotension: BP tolerating lasix; continue; continue midodrine.  5. H/o Nephrotic Syndrome: suspect due to AL amyloidosis.   Patient needs physical therapy/rehabilitation to improve his strength.  For questions or updates, please contact [place] Please consult [email or web address] for contact info under Cardiology/STEMI.      Signed, Kirk Ruths, MD  09/24/2017, 9:23 AM

## 2017-09-24 NOTE — Evaluation (Signed)
Occupational Therapy Evaluation Patient Details Name: [name] MRN: [id number] DOB: 11-01-1945 Today's Date: 09/24/2017    History of Present Illness [name] is a 72 y.o. male with medical history significant of diastolic CHF last EF 55 to 60%, A. fib on Xarelto, COPD, oxygen dependent on 3 L, and malignant lymphoplasmayctic lymphoma; who presents with complaints of weakness   Clinical Impression   Pt was admitted for the above.  He required assistance for adls prior to admission, but he is much more deconditioned at this time and needs much more assistance. Pt was limited by both fatique and pain throughout.  Will follow in acute setting with the goals listed below    Follow Up Recommendations  SNF    Equipment Recommendations  None recommended by OT    Recommendations for Other Services       Precautions / Restrictions Precautions Precautions: Fall Precaution Comments: significant c/o back bain, monitor VS, BP low Restrictions Weight Bearing Restrictions: No      Mobility Bed Mobility Overal bed mobility: Needs Assistance Bed Mobility: Supine to Sit;Sit to Supine     Supine to sit: Mod assist;Max assist;+2 for physical assistance;+2 for safety/equipment Sit to supine: Max assist;+2 for physical assistance;+2 for safety/equipment   General bed mobility comments: extra time due to pain; utilized pad to assist pt to from EOB,  Physical assist to manage LEs and to control trunk  Transfers                 General transfer comment: NT, pt sat at side of bed only but declined to attempt standing 2* fatigue/pain    Balance Overall balance assessment: Needs assistance Sitting-balance support: No upper extremity supported;Feet supported Sitting balance-Leahy Scale: Fair                                     ADL either performed or assessed with clinical judgement   ADL Overall ADL's : Needs assistance/impaired Eating/Feeding: Moderate  assistance;Sitting;Bed level   Grooming: Moderate assistance;Bed level;Sitting   Upper Body Bathing: Maximal assistance;Bed level;Sitting   Lower Body Bathing: Total assistance;+2 for physical assistance;Bed level   Upper Body Dressing : Maximal assistance;Bed level;Sitting   Lower Body Dressing: Total assistance;+2 for physical assistance;Bed level                 General ADL Comments: Pt agreeable to sitting EOB. he was fatiqued and hurt all over today.  He did not want to engage in any functional ADL activities at this time. Above levels are based on clinical judgment considering his decreased endurance and pain     Vision         Perception     Praxis      Pertinent Vitals/Pain Pain Assessment: 0-10 Faces Pain Scale: Hurts whole lot Pain Location: R shoulder, buttocks, back, feet Pain Descriptors / Indicators: Burning;Discomfort;Grimacing;Guarding;Moaning Pain Intervention(s): Limited activity within patient's tolerance;Monitored during session(Pain meds ltd by low BP)     Hand Dominance     Extremity/Trunk Assessment Upper Extremity Assessment Upper Extremity Assessment: Generalized weakness(painful; AAROM to 90 L and 70 R). Pt had increased pain with scapula AROM. He has multiple dressings on back and R scapula area; c/o more pain on L           Communication Communication Communication: No difficulties   Cognition Arousal/Alertness: Awake/alert Behavior During Therapy: WFL for tasks assessed/performed Overall  Cognitive Status: Within Functional Limits for tasks assessed                                     General Comments       Exercises     Shoulder Instructions      Home Living Family/patient expects to be discharged to:: Private residence Living Arrangements: Spouse/significant other Available Help at Discharge: Family               Bathroom Shower/Tub: Tub/shower unit;Curtain   Bathroom Toilet: Standard     Home  Equipment: Environmental consultant - 2 wheels;Bedside commode;Shower seat;Wheelchair - manual          Prior Functioning/Environment          Comments: pt had assistance with adls.  Has someone come in once a week to help him wash up and wife assisted with dressing        OT Problem List: Decreased strength;Decreased range of motion;Decreased activity tolerance;Impaired balance (sitting and/or standing);Decreased knowledge of use of DME or AE;Pain;Impaired UE functional use      OT Treatment/Interventions: Self-care/ADL training;Therapeutic exercise;Energy conservation;DME and/or AE instruction;Therapeutic activities;Patient/family education;Balance training    OT Goals(Current goals can be found in the care plan section) Acute Rehab OT Goals Patient Stated Goal: per wife, to walk OT Goal Formulation: With patient/family Time For Goal Achievement: 10/08/17 Potential to Achieve Goals: Good ADL Goals Pt Will Transfer to Toilet: with mod assist;with +2 assist;bedside commode;squat pivot transfer;stand pivot transfer Additional ADL Goal #1: pt will tolerate light adl/UE exercise while sitting EOB with set up/supervision and 3 rest breaks for 10 minutes Additional ADL Goal #2: pt will roll to bil sides with min A with bedrail for adls  OT Frequency: Min 2X/week   Barriers to D/C:            Co-evaluation   Reason for Co-Treatment: For patient/therapist safety PT goals addressed during session: Mobility/safety with mobility OT goals addressed during session: ADL's and self-care      AM-PAC PT "6 Clicks" Daily Activity     Outcome Measure Help from another person eating meals?: A Lot Help from another person taking care of personal grooming?: A Lot Help from another person toileting, which includes using toliet, bedpan, or urinal?: A Lot Help from another person bathing (including washing, rinsing, drying)?: A Lot Help from another person to put on and taking off regular upper body  clothing?: A Lot Help from another person to put on and taking off regular lower body clothing?: Total 6 Click Score: 11   End of Session    Activity Tolerance: Patient limited by fatigue Patient left: in bed;with call bell/phone within reach;with nursing/sitter in room;with bed alarm set  OT Visit Diagnosis: Muscle weakness (generalized) (M62.81);Pain Pain - part of body: Arm;Leg(bil)                Time: 1103-1140 OT Time Calculation (min): 37 min Charges:  OT General Charges $OT Visit: 1 Visit OT Evaluation $OT Eval Moderate Complexity: 1 Mod G-Codes:     [place], OTR/L [phone] 09/24/2017  Soniya Ashraf 09/24/2017, 1:09 PM

## 2017-09-24 NOTE — Clinical Social Work Note (Signed)
Clinical Social Work Assessment  Patient Details  Name: [name] MRN: [id number] Date of Birth: [birth year]  Date of referral:  09/24/17               Reason for consult:  Facility Placement                Permission sought to share information with:  Family Supports Permission granted to share information::  Yes, Verbal Permission Granted  Name::     Summer Parthasarathy  Agency::     Relationship::  Wife  Contact Information:  [phone]  Housing/Transportation Living arrangements for the past 2 months:  Single Family Home Source of Information:  Patient, Spouse Patient Interpreter Needed:  None Criminal Activity/Legal Involvement Pertinent to Current Situation/Hospitalization:  No - Comment as needed Significant Relationships:  Adult Children, Spouse Lives with:  Spouse Do you feel safe going back to the place where you live?  Yes(PT recommending SNF) Need for family participation in patient care:  Yes (Comment)  Care giving concerns:  Patient from home with spouse. Patient's wife reported that patient is currently active with [facility] and receives Skilled nursing care 2-3x/week, Physical Therapy 2-3x/week and an aide 2x/week to bathe patient. Patient's wife reported that patient has a wheelchair, walker, bedside commode and a medical lift chair. PT recommending SNF.    Social Worker assessment / plan:  CSW spoke with patient/patient's wife at bedside regarding PT recommendation for SNF. Patient deferred to his wife to discuss discharge planning, patient's wife requested that CSW speak outside so patient could sleep. Patient granted CSW verbal permission to speak with wife outside of the room. Patient's wife reported that she wants to take patient home and to continue to receiving home health services. Patient's wife reported that she is going to work with the [place] to get patient additional PT in the home because currently the PT is not consistent due to insurance coverage. Patient's  wife reported that she does not want patient to go to SNF for ST rehab because she doesn't want patient to lose current services and because she has heard bad things about SNFs. Patient's wife reported that she does not feel patient will get the same level care that he receives at home at [facility]. Patient's wife reported that she feels comfortable taking patient home and continuing to care for patient. Patient's wife inquired about placing patient into SNF from home if needed. CSW explained that CSW could ask RNCM to put a social worker in the home to assist if necessary. Patient's wife reported that patient will need PTAR for transport back home. CSW explained that RNCM sets up home health and can coordinate PTAR for patient's transportation home. CSW agreed to notify patient's RNCM. CSW and patient's wife went back in patient's room, CSW informed patient that patient's wife proposed plan to take patient home with home health services. Patient agreed to discharge home with home health services and declined SNF.  Patient to discharge home with home health services. CSW notified patient's RNCM that patient is interested in discharging home with home health services. RNCM following. CSW signing off, no other needs identified at this time.  Employment status:  Retired Nurse, adult PT Recommendations:  [place] / Referral to community resources:  Other (Comment Required)(Referral to [facility] for home health services)  Patient/Family's Response to care:  Patient's wife appreciative of CSW assistance with discharge planning.   Patient/Family's Understanding of and Emotional Response to Diagnosis,  Current Treatment, and Prognosis:  Patient presented calm and deferred to wife to discuss discharge planning. Patient's wife very involved in patient's care and provides patient's care at home. Patient's wife verbalized strong understanding of patient's care and  verbalized plan for patient to discharge home with home health services. Patient's wife hopeful that patient will regain strength and be able to walk to the mailbox again. Patient's wife verbalized plan to work with the [place] to get patient PT in the home consistently to reach his goal of walking to the mailbox.  Emotional Assessment Appearance:  Appears stated age Attitude/Demeanor/Rapport:  Other(Cooperative) Affect (typically observed):  Calm Orientation:  Oriented to Self, Oriented to Situation, Oriented to Place, Oriented to  Time Alcohol / Substance use:  Not Applicable Psych involvement (Current and /or in the community):  No (Comment)  Discharge Needs  Concerns to be addressed:  Care Coordination Readmission within the last 30 days:  No Current discharge risk:  Physical Impairment Barriers to Discharge:  Continued Medical Work up   [employer], LCSW 09/24/2017, 4:34 PM

## 2017-09-25 DIAGNOSIS — R55 Syncope and collapse: Secondary | ICD-10-CM

## 2017-09-25 LAB — BASIC METABOLIC PANEL
Anion gap: 11 (ref 5–15)
BUN: 41 mg/dL — ABNORMAL HIGH (ref 6–20)
CALCIUM: 8.4 mg/dL — AB (ref 8.9–10.3)
CO2: 32 mmol/L (ref 22–32)
Chloride: 98 mmol/L — ABNORMAL LOW (ref 101–111)
Creatinine, Ser: 1.2 mg/dL (ref 0.61–1.24)
GFR calc Af Amer: >60 mL/min (ref >60)
GFR calc non Af Amer: 58 mL/min — ABNORMAL LOW (ref >60)
GLUCOSE: 144 mg/dL — AB (ref 65–99)
Potassium: 4.8 mmol/L (ref 3.5–5.1)
Sodium: 141 mmol/L (ref 135–145)

## 2017-09-25 LAB — CBC WITH DIFFERENTIAL/PLATELET
Basophils Absolute: 0 10*3/uL (ref 0.0–0.1)
Basophils Relative: 0 %
Eosinophils Absolute: 0.1 10*3/uL (ref 0.0–0.7)
Eosinophils Relative: 2 %
HEMATOCRIT: 29 % — AB (ref 39.0–52.0)
Hemoglobin: 9.5 g/dL — ABNORMAL LOW (ref 13.0–17.0)
Lymphocytes Relative: 3 %
Lymphs Abs: 0.2 10*3/uL — ABNORMAL LOW (ref 0.7–4.0)
MCH: 33.2 pg (ref 26.0–34.0)
MCHC: 32.8 g/dL (ref 30.0–36.0)
MCV: 101.4 fL — ABNORMAL HIGH (ref 78.0–100.0)
MONO ABS: 0.2 10*3/uL (ref 0.1–1.0)
Monocytes Relative: 4 %
NEUTROS ABS: 5.2 10*3/uL (ref 1.7–7.7)
NEUTROS PCT: 91 %
Platelets: 255 10*3/uL (ref 150–400)
RBC: 2.86 MIL/uL — ABNORMAL LOW (ref 4.22–5.81)
RDW: 16 % — AB (ref 11.5–15.5)
WBC: 5.7 10*3/uL (ref 4.0–10.5)

## 2017-09-25 LAB — MAGNESIUM: Magnesium: 2.1 mg/dL (ref 1.7–2.4)

## 2017-09-25 NOTE — Consult Note (Signed)
   [facility] CM Inpatient Consult   09/25/2017  ANA LIAW Jul 09, 1945 [id number]    Patient screened for potential [facility] Care Management due to increased risk of unplanned hospital readmission score of 44% (extreme).  Went to bedside to speak with patient and wife. However, Mr. Tuggle was sleeping soundly and wife was not present.  Will follow up at later time for potential [facility] Care Management.   Will make inpatient RNCM aware [place] Management following for potential services.    Marthenia Rolling, MSN-Ed, RN,BSN [facility] Liaison [phone]

## 2017-09-25 NOTE — Progress Notes (Signed)
PROGRESS NOTE    [name]  RZN:[id number] DOB: 11-05-1945 DOA: 09/18/2017 PCP: Biagio Borg, MD    Brief Narrative:  Christopher Bushy Ingramis a 72 y.o.malewith medical history significant ofdiastolic CHF last EF 55 to 60%, A. fib on Xarelto, COPD, oxygen dependent on 3 L, and malignant lymphoplasmaycticlymphoma; whopresents with complaints of weakness over the last2-3 days associated with swelling of the lower extremities and hands and severe pain in the back. He had chemo about 3 weeks ago and follows up with Dr Irene Limbo. Chest x-ray showing signs of congestive heart failure with pulmonary edema and pleural effusions.      Assessment & Plan:   Principal Problem:   Diastolic CHF ([place]) Active Problems:   COPD mixed type (HCC)   Hypotension   Malignant lymphoplasmacytic lymphoma (HCC)   Elevated troponin   Tinea cruris   Generalized weakness   Pressure injury of sacral region, stage 2   Pleural effusion on left  #1 acute on chronic diastolic heart failure Questionable etiology.  Cardiac enzymes somewhat elevated however remain plateaued likely not secondary to acute coronary syndrome.  2D echo with EF of 60 to 65%, no wall motion abnormalities, left pleural effusion.  Patient on IV diuretics with good urine output.  Patient with a urine output of 2.4 L over the past 24 hours.  Patient is -9.7 L during this hospitalization.  Likely transition to oral diuretics per cardiology tomorrow.  Continue current dose of IV Lasix per cardiology.  Per cardiology.  2.  Chronic hypotension Continue Midodrine.  3.  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  4.  Persistent atrial fibrillation Status post history of failed DCCV.  Rate currently controlled.  Xarelto for anticoagulation.  5.  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  Outpatient follow-up.  6.  Chronic pain Patient with complaints of chronic pain.  Continue Lidoderm.   Continue increased dose of Lyrica at 50 mg daily.  Continue MSIR every 4 hours as needed breakthrough pain and IV morphine as needed.  Norco has been discontinued.  IV fentanyl has been discontinued.   7.  Severe protein calorie malnutrition Continue nutritional supplementation.  Dietitian following.  8.  History of COPD Stable.  Duo nebs as needed.  9.  Anemia with iron and folate deficiency Folic acid. Continue folic acid.  10.  Pressure injury of the sacrum Continue current wound care.  11.  Hematuria and urinary retention Hematuria likely from urinary retention and possible traumatic Foley catheter placement.  Urinalysis unremarkable.  Urine cleared up.  Continue Foley catheter.  Will need outpatient follow-up with urology. Monitor closely on Xarelto.  12.  Left pleural effusion Status post thoracentesis 09/22/2017.  Continue diuretics.  Follow.   DVT prophylaxis: Xarelto Code Status: Full Family Communication: Updated patient and wife at bedside. Disposition Plan: Home with home health versus skilled nursing facility when stable and okay with cardiology..   Consultants:   Cardiology: Dr.Croitoru 09/19/2017  [place] nurse Val Riles, RN  Procedures  Chest x-ray 09/23/2017, 09/22/2017, 09/21/2017, 09/18/2017  2D echo 09/19/2017  Antimicrobials:   Augmentin 09/21/2017     Subjective: Patient sitting up straight alert.  Pain improved today.  Denies any chest pain or shortness of breath.   Objective: Vitals:   09/24/17 1430 09/24/17 2017 09/25/17 0444 09/25/17 0636  BP: (!) 97/58 92/61 (!) 81/62 (!) 92/57  Pulse: 65 62 78 76  Resp: _0 Temp: (!) 97.5 F (36.4 C) 97.7  F (36.5 C) 97.6 F (36.4 C)   TempSrc: Oral Oral Oral   SpO2:  93% 92%   Weight:      Height:        Intake/Output Summary (Last 24 hours) at 09/25/2017 1053 Last data filed at 09/24/2017 2310 Gross per 24 hour  Intake 340 ml  Output 2400 ml  Net -2060 ml   Filed Weights   09/20/17 1000  09/21/17 0451 09/24/17 0502  Weight: 73.1 kg (161 lb 2.5 oz) 74 kg (163 lb 2.3 oz) 71.2 kg (157 lb)    Examination:  General exam: Chronically ill-appearing.  Cachectic.  Respiratory system: Lungs clear to auscultation bilaterally.  No wheezes, no crackles, no rhonchi.  Respiratory effort normal. Cardiovascular system: Irregularly irregular.  1+ bilateral pedal edema.  Lower extremity edema improving.   Gastrointestinal system: Abdomen is soft, nontender, nondistended, positive bowel sounds.  Central nervous system: Alert and oriented. No focal neurological deficits. Extremities: Symmetric 5 x 5 power. Skin: No rashes, lesions or ulcers Psychiatry: Judgement and insight appear normal. Mood & affect appropriate.     Data Reviewed: I have personally reviewed following labs and imaging studies  CBC: Recent Labs  Lab 09/18/17 1718 09/19/17 0151 09/21/17 1442 09/24/17 0945 09/25/17 0345  WBC 10.3 9.1 6.4 6.4 5.7  NEUTROABS 9.7* 8.4* 6.0 6.1 5.2  HGB 9.6* 9.8* 10.0* 8.9* 9.5*  HCT 28.8* 29.7* 30.8* 27.8* 29.0*  MCV 101.1* 102.4* 103.4* 102.2* 101.4*  PLT 345 296 304 243 697   Basic Metabolic Panel: Recent Labs  Lab 09/21/17 0333 09/22/17 0500 09/23/17 0500 09/24/17 0945 09/25/17 0345  NA 138 139 139 139 141  K 4.6 4.7 4.9 4.6 4.8  CL 100* 100* 99* 98* 98*  CO2 _0 32  GLUCOSE 116* 88 119* 77 144*  BUN 37* 39* 44* 43* 41*  CREATININE 1.10 1.10 1.19 1.14 1.20  CALCIUM 8.0* 8.2* 8.2* 8.3* 8.4*  MG  --   --   --  1.4* 2.1   GFR: Estimated Creatinine Clearance: 50.5 mL/min (by C-G formula based on SCr of 1.2 mg/dL). Liver Function Tests: Recent Labs  Lab 09/18/17 1718  AST 15  ALT 10*  ALKPHOS 273*  BILITOT 0.6  PROT 4.4*  ALBUMIN 1.6*   No results for input(s): LIPASE, AMYLASE in the last 168 hours. No results for input(s): AMMONIA in the last 168 hours. Coagulation Profile: Recent Labs  Lab 09/18/17 1718  INR 2.01   Cardiac Enzymes: Recent Labs   Lab 09/18/17 1718 09/18/17 1951 09/19/17 0151 09/19/17 0834  TROPONINI 0.10* 0.10* 0.10* 0.09*   BNP (last 3 results) No results for input(s): PROBNP in the last 8760 hours. HbA1C: No results for input(s): HGBA1C in the last 72 hours. CBG: No results for input(s): GLUCAP in the last 168 hours. Lipid Profile: No results for input(s): CHOL, HDL, LDLCALC, TRIG, CHOLHDL, LDLDIRECT in the last 72 hours. Thyroid Function Tests: No results for input(s): TSH, T4TOTAL, FREET4, T3FREE, THYROIDAB in the last 72 hours. Anemia Panel: No results for input(s): VITAMINB12, FOLATE, FERRITIN, TIBC, IRON, RETICCTPCT in the last 72 hours. Sepsis Labs: Recent Labs  Lab 09/18/17 1725  LATICACIDVEN 0.69    Recent Results (from the past 240 hour(s))  MRSA PCR Screening     Status: None   Collection Time: 09/18/17 10:25 PM  Result Value Ref Range Status   MRSA by PCR NEGATIVE NEGATIVE Final    Comment:        The  GeneXpert MRSA Assay (FDA approved for NASAL specimens only), is one component of a comprehensive MRSA colonization surveillance program. It is not intended to diagnose MRSA infection nor to guide or monitor treatment for MRSA infections. Performed at [facility], [place] [address] [place], [place] [address]   Culture, expectorated sputum-assessment     Status: None   Collection Time: 09/21/17  2:42 PM  Result Value Ref Range Status   Specimen Description SPUTUM  Final   Special Requests   Final    NONE Performed at [facility], [place] [address], [place], [place] [address]    Sputum evaluation THIS SPECIMEN IS ACCEPTABLE FOR SPUTUM CULTURE  Final   Report Status 09/22/2017 FINAL  Final  Culture, respiratory (NON-Expectorated)     Status: None   Collection Time: 09/21/17  2:42 PM  Result Value Ref Range Status   Specimen Description SPUTUM  Final   Special Requests NONE Reflexed from [id number]  Final   Gram Stain   Final    FEW WBC PRESENT,  PREDOMINANTLY PMN ABUNDANT GRAM POSITIVE RODS FEW GRAM POSITIVE COCCI IN CLUSTERS FEW GRAM NEGATIVE RODS    Culture   Final    Consistent with normal respiratory flora. Performed at [facility], [place] [address], [place], [place] [address]    Report Status 09/24/2017 FINAL  Final  Body fluid culture     Status: None (Preliminary result)   Collection Time: 09/22/17  2:41 PM  Result Value Ref Range Status   Specimen Description   Final    PLEURAL LEFT Performed at [facility] [address] [place], [place] [address]    Special Requests   Final    NONE Performed at [facility], [place] [address], [place], [place] [address]    Gram Stain   Final    RARE WBC PRESENT, PREDOMINANTLY PMN NO ORGANISMS SEEN    Culture   Final    NO GROWTH 2 DAYS Performed at [facility] [address], [place], [place] [address]    Report Status PENDING  Incomplete         Radiology Studies: Dg Chest Port 1 View  Result Date: 09/23/2017 CLINICAL DATA:  Short of breath EXAM: PORTABLE CHEST 1 VIEW COMPARISON:  09/22/2017 FINDINGS: Extensive airspace disease in the right lung is unchanged. Right pleural effusion unchanged Progression of airspace disease in the left lung most notably in the left lung base. Interval development of small left effusion. Port-A-Cath tip in the SVC. IMPRESSION: Stable airspace disease and pleural effusion on the right Progression of airspace disease and pleural effusion on the left. Findings may be due to congestive heart failure versus pneumonia. Electronically Signed   By: Franchot Gallo M.D.   On: 09/23/2017 19:48        Scheduled Meds: . acidophilus  1 capsule Oral Daily  . acyclovir  400 mg Oral Daily  . amoxicillin-clavulanate  1 tablet Oral Q12H  . Chlorhexidine Gluconate Cloth  6 each Topical Daily  . feeding supplement  1 Container Oral TID WC  . feeding supplement (ENSURE ENLIVE)  237 mL Oral Q24H  .  folic acid  1 mg Oral Daily  . furosemide  40 mg Intravenous BID  . lidocaine  1 patch Transdermal Q24H  . miconazole nitrate   Topical BID  . midodrine  10 mg Oral TID WC  . potassium chloride SA  20 mEq Oral BID  .  pregabalin  50 mg Oral BID  . rivaroxaban  20 mg Oral Q supper  . senna-docusate  1 tablet Oral BID  . sodium chloride flush  10-40 mL Intracatheter Q12H  . sodium chloride flush  3 mL Intravenous Q12H   Continuous Infusions: . sodium chloride       LOS: 7 days    Time spent: 35 mins    Irine Seal, MD [facility] Pager [phone]  If 7PM-7AM, please contact night-coverage [email or web address] Password [facility] 09/25/2017, 10:53 AM

## 2017-09-25 NOTE — Progress Notes (Signed)
Progress Note  Patient Name: [name] Date of Encounter: 09/25/2017  Primary Cardiologist: [name], MD   Subjective   Complains of leg pain; no dyspnea; no chest pain  Inpatient Medications    Scheduled Meds: . acidophilus  1 capsule Oral Daily  . acyclovir  400 mg Oral Daily  . amoxicillin-clavulanate  1 tablet Oral Q12H  . Chlorhexidine Gluconate Cloth  6 each Topical Daily  . feeding supplement  1 Container Oral TID WC  . feeding supplement (ENSURE ENLIVE)  237 mL Oral Q24H  . folic acid  1 mg Oral Daily  . furosemide  40 mg Intravenous BID  . lidocaine  1 patch Transdermal Q24H  . miconazole nitrate   Topical BID  . midodrine  10 mg Oral TID WC  . potassium chloride SA  20 mEq Oral BID  . pregabalin  50 mg Oral BID  . rivaroxaban  20 mg Oral Q supper  . senna-docusate  1 tablet Oral BID  . sodium chloride flush  10-40 mL Intracatheter Q12H  . sodium chloride flush  3 mL Intravenous Q12H   Continuous Infusions: . sodium chloride     PRN Meds: sodium chloride, acetaminophen, guaiFENesin-dextromethorphan, morphine, morphine injection, ondansetron (ZOFRAN) IV, polyethylene glycol, simethicone, sodium chloride flush, sodium chloride flush   Vital Signs    Vitals:   09/24/17 1430 09/24/17 2017 09/25/17 0444 09/25/17 0636  BP: (!) 97/58 92/61 (!) 81/62 (!) 92/57  Pulse: 65 62 78 76  Resp: _0 Temp: (!) 97.5 F (36.4 C) 97.7 F (36.5 C) 97.6 F (36.4 C)   TempSrc: Oral Oral Oral   SpO2:  93% 92%   Weight:      Height:        Intake/Output Summary (Last 24 hours) at 09/25/2017 1009 Last data filed at 09/24/2017 2310 Gross per 24 hour  Intake 340 ml  Output 2400 ml  Net -2060 ml   Filed Weights   09/20/17 1000 09/21/17 0451 09/24/17 0502  Weight: 161 lb 2.5 oz (73.1 kg) 163 lb 2.3 oz (74 kg) 157 lb (71.2 kg)    Telemetry    Atrial fibrillation; rate controlled - Personally Reviewed  Physical Exam   GEN: WD chronically ill    Neck: JVD Cardiac: irreguar, no murmur Respiratory: CTA anteriorly GI: Soft, ND MS: 1+ edema Neuro:  No focal findings   Labs    Chemistry Recent Labs  Lab 09/18/17 1718  09/23/17 0500 09/24/17 0945 09/25/17 0345  NA 136   < > 139 139 141  K 3.5   < > 4.9 4.6 4.8  CL 100*   < > 99* 98* 98*  CO2 26   < > 29 31 32  GLUCOSE 87   < > 119* 77 144*  BUN 28*   < > 44* 43* 41*  CREATININE 0.77   < > 1.19 1.14 1.20  CALCIUM 7.8*   < > 8.2* 8.3* 8.4*  PROT 4.4*  --   --   --   --   ALBUMIN 1.6*  --   --   --   --   AST 15  --   --   --   --   ALT 10*  --   --   --   --   ALKPHOS 273*  --   --   --   --   BILITOT 0.6  --   --   --   --  GFRNONAA >60   < > 58* >60 58*  GFRAA >60   < > >60 >60 >60  ANIONGAP 10   < > _0 < > = values in this interval not displayed.     Hematology Recent Labs  Lab 09/21/17 1442 09/24/17 0945 09/25/17 0345  WBC 6.4 6.4 5.7  RBC 2.98* 2.72* 2.86*  HGB 10.0* 8.9* 9.5*  HCT 30.8* 27.8* 29.0*  MCV 103.4* 102.2* 101.4*  MCH 33.6 32.7 33.2  MCHC 32.5 32.0 32.8  RDW 15.5 15.6* 16.0*  PLT 304 243 255    Cardiac Enzymes Recent Labs  Lab 09/18/17 1718 09/18/17 1951 09/19/17 0151 09/19/17 0834  TROPONINI 0.10* 0.10* 0.10* 0.09*   BNP Recent Labs  Lab 09/18/17 1718 09/22/17 0500  BNP 1,924.4* 1,282.2*     Radiology    Dg Chest Port 1 View  Result Date: 09/23/2017 CLINICAL DATA:  Short of breath EXAM: PORTABLE CHEST 1 VIEW COMPARISON:  09/22/2017 FINDINGS: Extensive airspace disease in the right lung is unchanged. Right pleural effusion unchanged Progression of airspace disease in the left lung most notably in the left lung base. Interval development of small left effusion. Port-A-Cath tip in the SVC. IMPRESSION: Stable airspace disease and pleural effusion on the right Progression of airspace disease and pleural effusion on the left. Findings may be due to congestive heart failure versus pneumonia. Electronically Signed   By:  Franchot Gallo M.D.   On: 09/23/2017 19:48    Cardiac Studies   Echocardiogram 09/19/2017: Study Conclusions  - Procedure narrative: The echo was ended before completing the protocol due to the patients pain level due to chemo and radiation. - Left ventricle: The cavity size was normal. Wall thickness was increased in a pattern of moderate LVH. Systolic function was normal. The estimated ejection fraction was in the range of 60% to 65%. Wall motion was normal; there were no regional wall motion abnormalities. The study is not technically sufficient to allow evaluation of LV diastolic function. - Mitral valve: Calcified annulus. Mildly thickened leaflets . There was mild regurgitation. - Left atrium: Severely dilated. - Tricuspid valve: Mildly thickened leaflets. There was trivial regurgitation.  Impressions:  - Incomplete study -discontinued due to patient discomfort. LVEF 60-65%, moderate LVH, mild MR, severe LAE, apperas in a-fib. There is a prominent left pleural effusion.   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   Assessment & Plan    1. Acute on Chronic Diastolic CHF: [id number]. Most of anasarca is felt secondary to decreased albumin/low oncotic pressure.  Volume status improving. Continue present dose of Lasix.  His blood pressure appears to be tolerating.  Will change to oral lasix in AM. Follow renal function.  EF normal on echo, 60-65%.   2. 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   3. Pemanent Atrial Fibrillation: continue xarelto; rate is  controlled.  4. Orthostatic Hypotension: continue midodrine.  5. H/o Nephrotic Syndrome: suspect due to AL amyloidosis.   Agree with PT  For questions or updates, please contact [place] Please consult [email or web address] for contact info under Cardiology/STEMI.      Signed, Kirk Ruths, MD  09/25/2017, 10:09 AM

## 2017-09-25 NOTE — Progress Notes (Signed)
April, CM from [facility] called. Pt's PCP is Dr. Beckey Downing at [facility], [place], [place] ext [phone], pager [phone].

## 2017-09-25 NOTE — Progress Notes (Signed)
Was notified by physical therapy that during his session patient had a syncopal episode when trying to stand became unresponsive and noted to have blood pressure of 67/52.  Patient was placed in a recliner and improved clinically.   Patient had not received his midodrine for his chronic hypotension at that time.  Will hold 6 PM IV Lasix dose.  Continue Midrin.  Will follow.    No charge.

## 2017-09-25 NOTE — Progress Notes (Signed)
Physical Therapy Treatment Patient Details Name: [name] MRN: [id number] DOB: January 09, 1946 Today's Date: 09/25/2017    History of Present Illness [name] is a 72 y.o. male with medical history significant of diastolic CHF last EF 55 to 60%, A. fib on Xarelto, COPD, oxygen dependent on 3 L, and malignant lymphoplasmayctic lymphoma; who presents with complaints of weakness    PT Comments    Syncope episode when assisting pt from bed to recliner. Staff Emergency light activated.  Pt in air mattress bed c/o back pain and requesting to be repositioned.  NT in room taking vitals.  BP supine was 90/55, HR 72, sats 94% on 4 lts.  Pt stated, "it runs low" ref to BP.  Assisted to EOB General bed mobility comments: extra time due to pain; utilized pad to assist pt to  EOB,  Physical assist to manage LEs and to control trunk   Once upright pt able to static sit at Supervision level.  No c/o other than back pain.  Allowed pt to sit EOB x 7 min.  Talking.  Stated he felt "weak" and had "no energy".  Assisted pt with standing when after approx 5 sec his knees buckled, became pale in color and verbally unresponsive with eyes fixed, quickly assisted to recliner.  Still verbally unresponsive, elevated B LE with head flat, push Emergency Staff button for immediate assistance.  BP in recliner was 67/52(58), HR 87 and sats 88% 4 lts pt started to respond.  "I just feel really weak".  With nursing staff, assisted back to bed via lateral slide.   Per chart review, pt and or spouse are declining SNF placement.    Follow Up Recommendations  SNF  Palliative   Equipment Recommendations  None recommended by PT    Recommendations for Other Services       Precautions / Restrictions Precautions Precautions: Fall Precaution Comments: significant c/o back bain, monitor VS, BP low Restrictions Weight Bearing Restrictions: No    Mobility  Bed Mobility Overal bed mobility: Needs Assistance Bed  Mobility: Supine to Sit     Supine to sit: Mod assist;Max assist;+2 for physical assistance;+2 for safety/equipment     General bed mobility comments: extra time due to pain; utilized pad to assist pt to  EOB,  Physical assist to manage LEs and to control trunk   Once upright pt able to static sit at Supervision level.    Transfers Overall transfer level: Needs assistance Equipment used: None Transfers: Sit to/from [facility] Sit to Stand: Max assist;+2 physical assistance Stand pivot transfers: Max assist;+2 physical assistance;Total assist       General transfer comment: elevated bed to pivot 1/4 turn to recliner when pt B knees buckled, pale in color and verbally unresponsive.  Quickly assisted to chair in reclined position.  Staff emergency button activtated as pt was still unresponsive, eyes fixed.  Ambulation/Gait             General Gait Details: transfer only due to syncope episode   Stairs             Wheelchair Mobility    Modified Rankin (Stroke Patients Only)       Balance                                            Cognition Arousal/Alertness: Awake/alert Behavior During Therapy:  WFL for tasks assessed/performed Overall Cognitive Status: Within Functional Limits for tasks assessed                                 General Comments: AxO x 3 following directions and conversive      Exercises      General Comments        Pertinent Vitals/Pain Pain Assessment: Faces Faces Pain Scale: Hurts even more Pain Location: back "I need to reposition" Pain Descriptors / Indicators: Discomfort;Grimacing Pain Intervention(s): Monitored during session;Repositioned    Home Living                      Prior Function            PT Goals (current goals can now be found in the care plan section) Progress towards PT goals: Progressing toward goals    Frequency    Min 2X/week      PT  Plan Current plan remains appropriate    Co-evaluation              AM-PAC PT "6 Clicks" Daily Activity  Outcome Measure  Difficulty turning over in bed (including adjusting bedclothes, sheets and blankets)?: Unable Difficulty moving from lying on back to sitting on the side of the bed? : Unable Difficulty sitting down on and standing up from a chair with arms (e.g., wheelchair, bedside commode, etc,.)?: Unable Help needed moving to and from a bed to chair (including a wheelchair)?: Total Help needed walking in hospital room?: Total Help needed climbing 3-5 steps with a railing? : Total 6 Click Score: 6    End of Session Equipment Utilized During Treatment: Gait belt Activity Tolerance: Other (comment);Patient limited by fatigue(syncope upon standing) Patient left: in bed Nurse Communication: Mobility status PT Visit Diagnosis: Unsteadiness on feet (R26.81);Pain;Difficulty in walking, not elsewhere classified (R26.2)     Time: 1410-1440 PT Time Calculation (min) (ACUTE ONLY): 30 min  Charges:  $Therapeutic Activity: 23-37 mins                    G Codes:       Rica Koyanagi  PTA [facility]  Acute  Rehab Pager      [phone]

## 2017-09-26 LAB — CBC WITH DIFFERENTIAL/PLATELET
BASOS ABS: 0 10*3/uL (ref 0.0–0.1)
BASOS PCT: 0 %
EOS PCT: 1 %
Eosinophils Absolute: 0.1 10*3/uL (ref 0.0–0.7)
HEMATOCRIT: 28.7 % — AB (ref 39.0–52.0)
Hemoglobin: 9.3 g/dL — ABNORMAL LOW (ref 13.0–17.0)
LYMPHS PCT: 5 %
Lymphs Abs: 0.2 10*3/uL — ABNORMAL LOW (ref 0.7–4.0)
MCH: 33.3 pg (ref 26.0–34.0)
MCHC: 32.4 g/dL (ref 30.0–36.0)
MCV: 102.9 fL — AB (ref 78.0–100.0)
MONO ABS: 0.2 10*3/uL (ref 0.1–1.0)
Monocytes Relative: 3 %
NEUTROS ABS: 4.9 10*3/uL (ref 1.7–7.7)
Neutrophils Relative %: 91 %
PLATELETS: 228 10*3/uL (ref 150–400)
RBC: 2.79 MIL/uL — ABNORMAL LOW (ref 4.22–5.81)
RDW: 15.6 % — AB (ref 11.5–15.5)
WBC: 5.3 10*3/uL (ref 4.0–10.5)

## 2017-09-26 LAB — BASIC METABOLIC PANEL
ANION GAP: 10 (ref 5–15)
BUN: 39 mg/dL — ABNORMAL HIGH (ref 6–20)
CO2: 35 mmol/L — ABNORMAL HIGH (ref 22–32)
Calcium: 8.1 mg/dL — ABNORMAL LOW (ref 8.9–10.3)
Chloride: 94 mmol/L — ABNORMAL LOW (ref 101–111)
Creatinine, Ser: 0.94 mg/dL (ref 0.61–1.24)
GFR calc Af Amer: >60 mL/min (ref >60)
Glucose, Bld: 104 mg/dL — ABNORMAL HIGH (ref 65–99)
POTASSIUM: 4.4 mmol/L (ref 3.5–5.1)
SODIUM: 139 mmol/L (ref 135–145)

## 2017-09-26 LAB — BODY FLUID CULTURE: Culture: NO GROWTH

## 2017-09-26 MED ORDER — PREGABALIN 50 MG PO CAPS: 50.0000 mg | ORAL_CAPSULE | Freq: Two times a day (BID) | ORAL | 0 refills | Status: AC

## 2017-09-26 MED ORDER — FUROSEMIDE 40 MG PO TABS: 40.0000 mg | ORAL_TABLET | Freq: Two times a day (BID) | ORAL | 0 refills | Status: DC

## 2017-09-26 MED ORDER — FUROSEMIDE 40 MG PO TABS
40.0000 mg | ORAL_TABLET | Freq: Two times a day (BID) | ORAL | Status: DC
  Administered 2017-09-26: 40 mg via ORAL
  Filled 2017-09-26: qty 1

## 2017-09-26 MED ORDER — MORPHINE SULFATE 15 MG PO TABS: 15.0000 mg | ORAL_TABLET | ORAL | 0 refills | Status: AC | PRN

## 2017-09-26 MED ORDER — ENSURE ENLIVE PO LIQD: 237.0000 mL | ORAL | 0 refills | Status: AC

## 2017-09-26 MED ORDER — SENNOSIDES-DOCUSATE SODIUM 8.6-50 MG PO TABS: 1.0000 | ORAL_TABLET | Freq: Two times a day (BID) | ORAL | Status: AC

## 2017-09-26 MED ORDER — MIDODRINE HCL 10 MG PO TABS: 10.0000 mg | ORAL_TABLET | Freq: Three times a day (TID) | ORAL | 0 refills | Status: DC

## 2017-09-26 MED ORDER — MICONAZOLE NITRATE POWD: 1.0000 "application " | Freq: Two times a day (BID) | 0 refills | Status: DC

## 2017-09-26 MED ORDER — HEPARIN SOD (PORK) LOCK FLUSH 100 UNIT/ML IV SOLN
500.0000 [IU] | INTRAVENOUS | Status: AC | PRN
  Administered 2017-09-26: 500 [IU]

## 2017-09-26 MED ORDER — FOLIC ACID 1 MG PO TABS: 1.0000 mg | ORAL_TABLET | Freq: Every day | ORAL | Status: AC

## 2017-09-26 MED ORDER — AMOXICILLIN-POT CLAVULANATE 875-125 MG PO TABS: 1.0000 | ORAL_TABLET | Freq: Two times a day (BID) | ORAL | 0 refills | Status: DC

## 2017-09-26 NOTE — Progress Notes (Signed)
Progress Note  Patient Name: [name] Date of Encounter: 09/26/2017  Primary Cardiologist: [name], MD   Subjective   No chest pain or dyspnea; complains of back pain  Inpatient Medications    Scheduled Meds: . acidophilus  1 capsule Oral Daily  . acyclovir  400 mg Oral Daily  . amoxicillin-clavulanate  1 tablet Oral Q12H  . Chlorhexidine Gluconate Cloth  6 each Topical Daily  . feeding supplement  1 Container Oral TID WC  . feeding supplement (ENSURE ENLIVE)  237 mL Oral Q24H  . folic acid  1 mg Oral Daily  . furosemide  40 mg Intravenous BID  . lidocaine  1 patch Transdermal Q24H  . miconazole nitrate   Topical BID  . midodrine  10 mg Oral TID WC  . potassium chloride SA  20 mEq Oral BID  . pregabalin  50 mg Oral BID  . rivaroxaban  20 mg Oral Q supper  . senna-docusate  1 tablet Oral BID  . sodium chloride flush  10-40 mL Intracatheter Q12H  . sodium chloride flush  3 mL Intravenous Q12H   Continuous Infusions: . sodium chloride     PRN Meds: sodium chloride, acetaminophen, guaiFENesin-dextromethorphan, morphine, morphine injection, ondansetron (ZOFRAN) IV, polyethylene glycol, simethicone, sodium chloride flush, sodium chloride flush   Vital Signs    Vitals:   09/25/17 1411 09/25/17 1437 09/25/17 2121 09/26/17 0553  BP: (!) _0 100/66  Pulse: 72 63 76 60  Resp: _1 Temp:   97.6 F (36.4 C) 97.7 F (36.5 C)  TempSrc:   Oral   SpO2: 94% 91% 93% 100%  Weight:      Height:        Intake/Output Summary (Last 24 hours) at 09/26/2017 1028 Last data filed at 09/26/2017 0905 Gross per 24 hour  Intake 290 ml  Output 2500 ml  Net -2210 ml   Filed Weights   09/21/17 0451 09/24/17 0502 09/25/17 1100  Weight: 163 lb 2.3 oz (74 kg) 157 lb (71.2 kg) 145 lb (65.8 kg)    Telemetry    Atrial fibrillation; rate controlled - Personally Reviewed  Physical Exam   GEN: WD cachetic appearing Neck: JVD, supple Cardiac:  irreguar Respiratory: CTA with no wheeze GI: Soft, ND, NT MS: 1+ ankle edema Neuro:  Grossly intact   Labs    Chemistry Recent Labs  Lab 09/24/17 0945 09/25/17 0345 09/26/17 0455  NA 139 141 139  K 4.6 4.8 4.4  CL 98* 98* 94*  CO2 31 32 35*  GLUCOSE 77 144* 104*  BUN 43* 41* 39*  CREATININE 1.14 1.20 0.94  CALCIUM 8.3* 8.4* 8.1*  GFRNONAA >60 58* >60  GFRAA >60 >60 >60  ANIONGAP _2 Hematology Recent Labs  Lab 09/24/17 0945 09/25/17 0345 09/26/17 0455  WBC 6.4 5.7 5.3  RBC 2.72* 2.86* 2.79*  HGB 8.9* 9.5* 9.3*  HCT 27.8* 29.0* 28.7*  MCV 102.2* 101.4* 102.9*  MCH 32.7 33.2 33.3  MCHC 32.0 32.8 32.4  RDW 15.6* 16.0* 15.6*  PLT 243 255 228   BNP Recent Labs  Lab 09/22/17 0500  BNP 1,282.2*      Cardiac Studies   Echocardiogram 09/19/2017: Study Conclusions  - Procedure narrative: The echo was ended before completing the protocol due to the patients pain level due to chemo and radiation. - Left ventricle: The cavity size was normal. Wall thickness was increased in a pattern of  moderate LVH. Systolic function was normal. The estimated ejection fraction was in the range of 60% to 65%. Wall motion was normal; there were no regional wall motion abnormalities. The study is not technically sufficient to allow evaluation of LV diastolic function. - Mitral valve: Calcified annulus. Mildly thickened leaflets . There was mild regurgitation. - Left atrium: Severely dilated. - Tricuspid valve: Mildly thickened leaflets. There was trivial regurgitation.  Impressions:  - Incomplete study -discontinued due to patient discomfort. LVEF 60-65%, moderate LVH, mild MR, severe LAE, apperas in a-fib. There is a prominent left pleural effusion.   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   Assessment & Plan    1. Acute on Chronic Diastolic CHF: [id number].  Patient is much improved.  Discontinue IV Lasix and begin oral Lasix 40 mg twice daily.  Most of anasarca is felt secondary to decreased albumin/low oncotic pressure.  EF normal on echo, 60-65%.  Would fluid restrict to 1 L daily and also treat with low-sodium diet.  2. 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   3. Pemanent Atrial Fibrillation: Rate is controlled on no medications.  Continue Xarelto.  4. Orthostatic Hypotension: continue midodrine.  Blood pressure tolerated diuresis.   5. H/o Nephrotic Syndrome: suspect due to AL amyloidosis.   Patient can be discharged from a cardiac standpoint.  We will check bmet 1 week after discharge.  Follow-up Dr. Aundra Dubin 2 to 4 weeks following discharge.  We will sign off.  Please call with questions.  For questions or updates, please contact [place] Please consult [email or web address] for contact info under Cardiology/STEMI.      Signed, Kirk Ruths, MD  09/26/2017, 10:28 AM

## 2017-09-26 NOTE — Progress Notes (Signed)
A call was made to [facility] [phone] ext [phone] and cell [phone] with no answer, VM left for her to return call to CM. Will proceed to [facility] for HH/wife at present time. HH with [facility] in house rep aware. Will need face to face for [facility] please.

## 2017-09-26 NOTE — Discharge Summary (Signed)
Physician Discharge Summary  [name] BXU:[id number] DOB: 04/15/1946 DOA: 09/18/2017  PCP: Biagio Borg, MD  Admit date: 09/18/2017 Discharge date: 09/26/2017  Time spent: 60 minutes  Recommendations for Outpatient Follow-up:  1. Follow-up with Dr. Aundra Dubin in 1 to 2 weeks.  Follow-up for hospitalization for acute on chronic CHF exacerbation.  Patient will need a basic metabolic profile done in 1 week which per cardiology will be scheduled. 2. Follow-up with Biagio Borg, MD in 2 weeks.  On follow-up patient's chronic pain regimen will need to be reassessed and patient may benefit from outpatient referral to pain clinic.  Patient will need a basic metabolic profile done to follow-up on electrolytes and renal function. 3. Follow-up with urology in 1 week for follow-up of urinary retention and voiding trial.   Discharge Diagnoses:  Principal Problem:   Acute on chronic diastolic CHF (congestive heart failure) (HCC) Active Problems:   COPD mixed type (HCC)   Hypotension   Malignant lymphoplasmacytic lymphoma (HCC)   Diastolic CHF (HCC)   Elevated troponin   Tinea cruris   Generalized weakness   Pressure injury of sacral region, stage 2   Pleural effusion on left   Discharge Condition: Stable and improved.  Diet recommendation: Heart healthy  Filed Weights   09/24/17 0502 09/25/17 1100 09/26/17 1422  Weight: 71.2 kg (157 lb) 65.8 kg (145 lb) 64.3 kg (141 lb 10.9 oz)    History of present illness:  Per Dr Tamala Julian  HPI: [name] is a 72 y.o. male with medical history significant of diastolic CHF last EF 55 to 60%, A. fib on Xarelto, COPD, oxygen dependent on 3 L, and malignant lymphoplasmayctic lymphoma; who presented with complaints of weakness over the last 2-3 days.  At baseline the patient is cared for by his wife along with home health aides.  He is normally able to stand to use the restroom, sit up, watch TV, and is more conversant.  However,  he had been more  lethargic sleeping all day and not getting up.  Notes being very fatigued and has had increased swelling of his legs and hands.  Patient also complains of having severe.  Pain all over for which lidocaine patches and hydrocodone have provided minimum relief.  Associated symptoms include rash of his inguinal area.  Denied any significant chest pain, shortness of breath, nausea, vomiting, abdominal pain, dysuria, blood in stool/urine, or diarrhea symptoms.  He had just recently had chemotherapy approximately 3 weeks ago, and is followed by Dr. Irene Limbo.    ED Course: Upon admission into the emergency department patient was noted to have a pulse of 59-78, respirations 11-19, blood pressure 94/66-99/70, O2 saturations 95 to 100% on 3 L.  Lab work revealed WBC 10.3, hemoglobin 9.6, BUN 28, creatinine 0.77, glucose 87, lactic acid 0.69, INR 2, troponin 0.1, and BNP 1924.4.  Chest x-ray showing signs of congestive heart failure with pulmonary edema and pleural effusions.  Patient was given 10 mg of Midrin, 40 mg of Lasix IV, 75 mg of fentanyl IV and 4 mg of Zofran.    Hospital Course:   #1 acute on chronic diastolic heart failure Patient had presented with lethargy, and signs of volume overload.  She was noted to be in volume overload which was felt also partly secondary to hypoalbuminemia.  Cardiac enzymes somewhat elevated however remain plateaued likely not secondary to acute coronary syndrome.  2D echo with EF of 60 to 65%, no wall motion abnormalities, left pleural  effusion.  Cardiology was consulted and followed the patient throughout the hospitalization.  Patient was placed on IV diuretics with good urine output.   Patient was -13.3 to 7 L during his hospitalization.  Patient improved clinically and subsequently transitioned from IV Lasix to oral Lasix per cardiology at 40 mg twice daily.  Patient will follow-up with cardiology in the outpatient setting.  Patient was discharged in stable and improved  condition.   2.  Chronic hypotension Patient maintained on Midodrin throughout the hospitalization.  3.  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  4.  Persistent atrial fibrillation Status post history of failed DCCV.  Rate controlled during the hospitalization.  Xarelto for anticoagulation.  5.  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  Outpatient follow-up with cardiology.  6.  Chronic pain Patient with complaints of chronic pain.    Patient's Lidoderm patch was initially placed on however due to a contact dermatitis from Lidoderm patch this has been discontinued.  Patient was started on Lyrica and dose increased to 50 mg daily.  Patient was also placed on MSIR every 4 hours as needed for breakthrough pain with improvement with pain management.  Outpatient follow-up with PCP.    7.  Severe protein calorie malnutrition Patient placed on nutritional supplementation.   8.  History of COPD Stable. Maintained on Duo nebs as needed.  9.  Anemia with iron and folate deficiency Patient was  Maintained on Folic acid.   10.  Pressure injury of the sacrum Wound care during hospitalization.   11.  Hematuria and urinary retention Hematuria likely from urinary retention and possible traumatic Foley catheter placement.  Urinalysis unremarkable.  Urine cleared up.  Patient maintained on a Foley catheter which will be discharged home on.  Outpatient follow-up with urology for voiding trial and further evaluation.   12.  Left pleural effusion Status post thoracentesis 09/22/2017. Improved on diuretics. Outpatient follow up.       Procedures:  Chest x-ray 09/23/2017, 09/22/2017, 09/21/2017, 09/18/2017  2D echo 09/19/2017      Consultations:  Cardiology: Dr.Croitoru 09/19/2017  [place] nurse Val Riles, RN      Discharge Exam: Vitals:   09/26/17 0553 09/26/17 1422  BP: 100/66 93/67  Pulse: 60 63  Resp: 18 16   Temp: 97.7 F (36.5 C)   SpO2: 100% 100%    General: NAD. Cachetic. Chronically ill appearing Cardiovascular: Irregularly irregular Respiratory: CTAB anterior lung fields.  Discharge Instructions   Discharge Instructions    Diet - low sodium heart healthy   Complete by:  As directed    1 L/day FLUID RESTRICTION   Increase activity slowly   Complete by:  As directed      Allergies as of 09/26/2017      Reactions   Demerol [meperidine] Other (See Comments)   UNSPECIFIED REACTION  Causes system to shutdown. ?    Oxycodone Hcl Other (See Comments)   Pt states this medication 'wires him up' and makes pt hyper; pt does not want to take this ever again      Medication List    STOP taking these medications   HYDROcodone-acetaminophen 10-325 MG tablet Commonly known as:  NORCO   lidocaine 5 % Commonly known as:  LIDODERM   nystatin cream Commonly known as:  MYCOSTATIN   polycarbophil 625 MG tablet Commonly known as:  FIBERCON   traZODone 50 MG tablet Commonly known as:  DESYREL     TAKE these medications  acyclovir 400 MG tablet Commonly known as:  ZOVIRAX Take 1 tablet (400 mg total) by mouth daily. Notes to patient:  Last Dose of this Medication was given on 09/26/2017 at 1020 am   amoxicillin-clavulanate 875-125 MG tablet Commonly known as:  AUGMENTIN Take 1 tablet by mouth every 12 (twelve) hours for 2 days. Notes to patient:  Last Dose of this Medication was given on 09/26/2017 at 1020 am   feeding supplement (ENSURE ENLIVE) Liqd Take 237 mLs by mouth daily. Start taking on:  09/27/2017 Notes to patient:  Last Ensure was on 01/19/300 at 4996 pm   folic acid 1 MG tablet Commonly known as:  FOLVITE Take 1 tablet (1 mg total) by mouth daily. Start taking on:  09/27/2017 Notes to patient:  Last Dose of this Medication was given on 09/26/2017 1020 am   furosemide 40 MG tablet Commonly known as:  LASIX Take 1 tablet (40 mg total) by mouth 2 (two) times  daily. What changed:    when to take this  Another medication with the same name was removed. Continue taking this medication, and follow the directions you see here. Notes to patient:  Last Dose of this Medication was given on 09/26/2017 at 7:42 am   hydrOXYzine 10 MG tablet Commonly known as:  ATARAX/VISTARIL Take 6 tablets (60 mg total) by mouth 3 (three) times daily as needed.   miconazole nitrate Powd Commonly known as:  MICATIN Apply 1 application topically 2 (two) times daily for 7 days. Apply to rash in groin *SPD Supplied Product* Notes to patient:  This Powder was last applied on 09/26/2017 at 1021 am   Miconazole Powd Apply 5 g topically 2 (two) times daily. To rash in groin.   midodrine 10 MG tablet Commonly known as:  PROAMATINE Take 1 tablet (10 mg total) by mouth 3 (three) times daily with meals. What changed:  when to take this Notes to patient:  Last Dose of this Medication was given on 09/26/2017 at 1215 pm   morphine 15 MG tablet Commonly known as:  MSIR Take 1 tablet (15 mg total) by mouth every 4 (four) hours as needed for moderate pain. Notes to patient:  Last Dose of this Medication was given on 09/26/2017 at 7:42 am   potassium chloride SA 20 MEQ tablet Commonly known as:  K-DUR,KLOR-CON Take 1 tablet (20 mEq total) by mouth 2 (two) times daily. Notes to patient:  Last Dose of this Medication was given on 09/26/2017 at 1020 am   pregabalin 50 MG capsule Commonly known as:  LYRICA Take 1 capsule (50 mg total) by mouth 2 (two) times daily. Notes to patient:  Last Dose of this Medication was given on 09/26/2017 at 1020 am   PROBIOTIC DAILY PO Take 1 tablet by mouth daily.   prochlorperazine 10 MG tablet Commonly known as:  COMPAZINE Take 1 tablet (10 mg total) every 6 (six) hours as needed by mouth (Nausea or vomiting).   senna-docusate 8.6-50 MG tablet Commonly known as:  Senokot-S Take 1 tablet by mouth 2 (two) times daily. Notes to patient:  Last  Dose of this Medication was given on 09/26/2017 at 1020 am   XARELTO 20 MG Tabs tablet Generic drug:  rivaroxaban TAKE 1 TABLET BY MOUTH EVERY DAY WITH SUPPER Notes to patient:  Please take this dose on 09/26/2017 with dinner            Durable Medical Equipment  (From admission, onward)  Start     Ordered   09/26/17 1404  For home use only DME Other see comment  Once    Comments:  Alternating Pressure Pump Pad for Hospital bed at home.   09/26/17 1405     Allergies  Allergen Reactions  . Demerol [Meperidine] Other (See Comments)    UNSPECIFIED REACTION  Causes system to shutdown. ?   . Oxycodone Hcl Other (See Comments)    Pt states this medication 'wires him up' and makes pt hyper; pt does not want to take this ever again   Follow-up Information    [facility].   Specialty:  [place] Why:  Please call for follow up visit if [facility] have not called you in 24 to 48 hours after discharge.  Contact information: [address] [place] [address] [phone]        Biagio Borg, MD. Schedule an appointment as soon as possible for a visit in 2 week(s).   Specialties:  Internal Medicine, Radiology Contact information: [place] [address] [phone]        [name], MD .   Specialty:  Cardiology Contact information: [place] [address] [phone]        [place]. Schedule an appointment as soon as possible for a visit in 1 week(s).   Why:  F/U IN 1 WEEK FOR URINARY RETENTION. Contact information: [place] [phone]           The results of significant diagnostics from this hospitalization (including imaging, microbiology, ancillary and laboratory) are listed below for reference.    Significant Diagnostic Studies: Dg Chest 2 View  Result Date: 09/21/2017 CLINICAL  DATA:  Follow-up pulmonary edema. EXAM: CHEST - 2 VIEW COMPARISON:  09/18/2017 FINDINGS: Cardiomegaly as seen previously. Redemonstration of bilateral effusions with pulmonary atelectasis. Effusions may be larger. Pulmonary density appears worsened, particularly on the right. IMPRESSION: Radiographic worsening. Enlargement of effusions, particularly on the right. Worsened pulmonary density presumed represent worsened edema. Can't rule out pneumonia particularly on the right. Electronically Signed   By: Nelson Chimes M.D.   On: 09/21/2017 13:29   Dg Chest Port 1 View  Result Date: 09/23/2017 CLINICAL DATA:  Short of breath EXAM: PORTABLE CHEST 1 VIEW COMPARISON:  09/22/2017 FINDINGS: Extensive airspace disease in the right lung is unchanged. Right pleural effusion unchanged Progression of airspace disease in the left lung most notably in the left lung base. Interval development of small left effusion. Port-A-Cath tip in the SVC. IMPRESSION: Stable airspace disease and pleural effusion on the right Progression of airspace disease and pleural effusion on the left. Findings may be due to congestive heart failure versus pneumonia. Electronically Signed   By: Franchot Gallo M.D.   On: 09/23/2017 19:48   Dg Chest Port 1 View  Result Date: 09/22/2017 CLINICAL DATA:  Status post left-sided thoracentesis EXAM: PORTABLE CHEST 1 VIEW COMPARISON:  Chest radiograph Sep 21, 2017 FINDINGS: No evident pneumothorax. There is resolution of left pleural effusion following thoracentesis. There is a partially loculated right pleural effusion with consolidation throughout much of the right lung, with the greatest degree of concentration in the right mid lung, stable. There is a small calcified granuloma in the right upper lobe, stable. There is slight atelectasis in the medial left lung base. Left lung otherwise is clear. Heart is  upper normal in size with pulmonary vascularity normal. No adenopathy. Port-A-Cath tip is in the  superior vena cava. No evident bone lesions. IMPRESSION: Resolution of pleural effusion on the left without pneumothorax. Left lung clear except for slight left base atelectasis. Persistent partially loculated pleural effusion on the right with extensive consolidation throughout much of the right lung, stable. Stable cardiac silhouette. Port-A-Cath tip in superior vena cava near cavoatrial junction. Electronically Signed   By: Lowella Grip III M.D.   On: 09/22/2017 15:37   Dg Chest Portable 1 View  Result Date: 09/18/2017 CLINICAL DATA:  Weakness.  Edema in the feet and hands. EXAM: PORTABLE CHEST 1 VIEW COMPARISON:  05/09/2017 and older exams. FINDINGS: Moderate enlargement of the cardiopericardial silhouette. Bilateral pleural effusions. Bilateral interstitial thickening and hazy central and lung base airspace opacities. No pneumothorax. Stable calcified right paratracheal lymph nodes. Right anterior chest wall Port-A-Cath, new from the prior exam. Catheter tip projects in the right atrium. IMPRESSION: 1. Findings are consistent with congestive heart failure with interstitial and hazy airspace pulmonary edema and bilateral pleural effusions. Electronically Signed   By: Lajean Manes M.D.   On: 09/18/2017 17:38   US Thoracentesis Asp Pleural Space W/img Guide  Result Date: 09/22/2017 INDICATION: Patient with history of CHF, malignant lymphoplasmacytic lymphoma, suspected cardiac AL amyloidosis, nephrotic syndrome, left pleural effusion. Request made for diagnostic and therapeutic left thoracentesis. EXAM: ULTRASOUND GUIDED DIAGNOSTIC AND THERAPEUTIC LEFT THORACENTESIS MEDICATIONS: None COMPLICATIONS: None immediate. PROCEDURE: An ultrasound guided thoracentesis was thoroughly discussed with the patient and questions answered. The benefits, risks, alternatives and complications were also discussed. The patient understands and wishes to proceed with the procedure. Written consent was obtained. Ultrasound  was performed to localize and mark an adequate pocket of fluid in the left chest. The area was then prepped and draped in the normal sterile fashion. 1% Lidocaine was used for local anesthesia. Under ultrasound guidance a 6 Fr Safe-T-Centesis catheter was introduced. Thoracentesis was performed. The catheter was removed and a dressing applied. FINDINGS: A total of approximately 870 cc of light yellow fluid was removed. Samples were sent to the laboratory as requested by the clinical team. IMPRESSION: Successful ultrasound guided diagnostic and therapeutic left thoracentesis yielding 870 cc of pleural fluid. Read by: Rowe Robert, PA-C Electronically Signed   By: Jerilynn Mages.  Shick M.D.   On: 09/22/2017 14:45    Microbiology: Recent Results (from the past 240 hour(s))  MRSA PCR Screening     Status: None   Collection Time: 09/18/17 10:25 PM  Result Value Ref Range Status   MRSA by PCR NEGATIVE NEGATIVE Final    Comment:        The GeneXpert MRSA Assay (FDA approved for NASAL specimens only), is one component of a comprehensive MRSA colonization surveillance program. It is not intended to diagnose MRSA infection nor to guide or monitor treatment for MRSA infections. Performed at [facility], [place] [address], [place], [place] [address]   Culture, expectorated sputum-assessment     Status: None   Collection Time: 09/21/17  2:42 PM  Result Value Ref Range Status   Specimen Description SPUTUM  Final   Special Requests   Final    NONE Performed at [facility], [place] [address] [place], [place] [address]    Sputum evaluation THIS SPECIMEN IS ACCEPTABLE FOR SPUTUM CULTURE  Final   Report Status 09/22/2017 FINAL  Final  Culture, respiratory (NON-Expectorated)     Status: None   Collection Time:  09/21/17  2:42 PM  Result Value Ref Range Status   Specimen Description SPUTUM  Final   Special Requests NONE Reflexed from [id number]  Final   Gram Stain   Final    FEW WBC  PRESENT, PREDOMINANTLY PMN ABUNDANT GRAM POSITIVE RODS FEW GRAM POSITIVE COCCI IN CLUSTERS FEW GRAM NEGATIVE RODS    Culture   Final    Consistent with normal respiratory flora. Performed at [facility], [place] [address] [place], [place] [address]    Report Status 09/24/2017 FINAL  Final  Body fluid culture     Status: None   Collection Time: 09/22/17  2:41 PM  Result Value Ref Range Status   Specimen Description   Final    PLEURAL LEFT Performed at [place] [address], [place], [place] [address]    Special Requests   Final    NONE Performed at [facility], [place] [address] [place], [place] [address]    Gram Stain   Final    RARE WBC PRESENT, PREDOMINANTLY PMN NO ORGANISMS SEEN    Culture   Final    NO GROWTH 3 DAYS Performed at [place] [address] [place], [place] [address]    Report Status 09/26/2017 FINAL  Final     Labs: Basic Metabolic Panel: Recent Labs  Lab 09/22/17 0500 09/23/17 0500 09/24/17 0945 09/25/17 0345 09/26/17 0455  NA 139 139 139 141 139  K 4.7 4.9 4.6 4.8 4.4  CL 100* 99* 98* 98* 94*  CO2 _0 32 35*  GLUCOSE 88 119* 77 144* 104*  BUN 39* 44* 43* 41* 39*  CREATININE 1.10 1.19 1.14 1.20 0.94  CALCIUM 8.2* 8.2* 8.3* 8.4* 8.1*  MG  --   --  1.4* 2.1  --    Liver Function Tests: No results for input(s): AST, ALT, ALKPHOS, BILITOT, PROT, ALBUMIN in the last 168 hours. No results for input(s): LIPASE, AMYLASE in the last 168 hours. No results for input(s): AMMONIA in the last 168 hours. CBC: Recent Labs  Lab 09/21/17 1442 09/24/17 0945 09/25/17 0345 09/26/17 0455  WBC 6.4 6.4 5.7 5.3  NEUTROABS 6.0 6.1 5.2 4.9  HGB 10.0* 8.9* 9.5* 9.3*  HCT 30.8* 27.8* 29.0* 28.7*  MCV 103.4* 102.2* 101.4* 102.9*  PLT 304 243 255 228   Cardiac Enzymes: No results for input(s): CKTOTAL, CKMB, CKMBINDEX, TROPONINI in the last 168 hours. BNP: BNP (last 3 results) Recent Labs     04/30/17 1327 09/18/17 1718 09/22/17 0500  BNP 680.0* 1,924.4* 1,282.2*    ProBNP (last 3 results) No results for input(s): PROBNP in the last 8760 hours.  CBG: No results for input(s): GLUCAP in the last 168 hours.     Signed:  Irine Seal MD.  [facility] 09/26/2017, 2:57 PM

## 2017-09-26 NOTE — Progress Notes (Signed)
[facility] transportation forms completed. RN will call [facility] when ready to transport home.

## 2017-09-26 NOTE — Progress Notes (Signed)
Pt to be discharged to home via ambulance this afternoon. Pt and Wife given discharge teaching including Medications and schedules. Pt's Wife verbalized understanding of all discharge instructions. Pt is to be discharged with Foley and to follow up with Urology in one week. Pt's Wife shown how to empty foley and able to return demonstration.  Discharge Packet and Prescriptions given to wife at discharge.

## 2017-09-26 NOTE — Progress Notes (Signed)
Patient's BP 82/50. On call provider, [place], aware. Advised that pt can not be held due to low BP and will proceed to be discharged with [facility]. [facility] arrived and picked up pt, aware of low BP.

## 2017-09-26 NOTE — Progress Notes (Signed)
Occupational Therapy Treatment Patient Details Name: [name] MRN: [id number] DOB: 1945-08-17 Today's Date: 09/26/2017    History of present illness [name] is a 72 y.o. male with medical history significant of diastolic CHF last EF 55 to 60%, A. fib on Xarelto, COPD, oxygen dependent on 3 L, and malignant lymphoplasmayctic lymphoma; who presents with complaints of weakness   OT comments  Goal added for BUE exercise. Pt declined OOB after exercise   Follow Up Recommendations  SNF;Home health OT  Wife wants to take pt home although pt needs significant A   Equipment Recommendations  None recommended by OT    Recommendations for Other Services      Precautions / Restrictions Precautions Precautions: Fall Precaution Comments: significant c/o back bain, monitor VS, BP low       Mobility Bed Mobility               General bed mobility comments: did not perform  Transfers                 General transfer comment: did not perform        ADL either performed or assessed with clinical judgement   ADL Overall ADL's : Needs assistance/impaired Eating/Feeding: Moderate assistance Eating/Feeding Details (indicate cue type and reason): wife was feeding pt when OT walked in.  Educated on importance of self feeding for arm strength.  OT positioned pt  bed and he was able to feed self with set up                                         Vision Patient Visual Report: No change from baseline     Perception     Praxis      Cognition Arousal/Alertness: Awake/alert Behavior During Therapy: WFL for tasks assessed/performed Overall Cognitive Status: Within Functional Limits for tasks assessed                                 General Comments: AxO x 3 following directions and conversive        Exercises Shoulder Exercises Shoulder Flexion: AROM;10 reps(3 sets) Elbow Flexion: 20 reps;AROM Elbow Extension: AROM;20  reps Wrist Flexion: AROM;20 reps Wrist Extension: AROM;20 reps   Shoulder Instructions       General Comments      Pertinent Vitals/ Pain       Pain Assessment: No/denies pain     Prior Functioning/Environment              Frequency  Min 2X/week        Progress Toward Goals  OT Goals(current goals can now be found in the care plan section)  Progress towards OT goals: Progressing toward goals     Plan Discharge plan needs to be updated(wife wants to take pt home)    Co-evaluation                 AM-PAC PT "6 Clicks" Daily Activity     Outcome Measure   Help from another person eating meals?: A Lot Help from another person taking care of personal grooming?: A Lot Help from another person toileting, which includes using toliet, bedpan, or urinal?: A Lot Help from another person bathing (including washing, rinsing, drying)?: A Lot Help from another person to put on and taking off  regular upper body clothing?: A Lot Help from another person to put on and taking off regular lower body clothing?: Total 6 Click Score: 11    End of Session    OT Visit Diagnosis: Muscle weakness (generalized) (M62.81);Pain Pain - part of body: Arm(bil)   Activity Tolerance Patient limited by fatigue;Patient tolerated treatment well   Patient Left in bed;with call bell/phone within reach;with nursing/sitter in room;with bed alarm set   Nurse Communication Mobility status        Time: 1218-1229 OT Time Calculation (min): 11 min  Charges: OT General Charges $OT Visit: 1 Visit OT Treatments $Therapeutic Activity: 8-22 mins  [place], [place]   Betsy Pries 09/26/2017, 12:44 PM

## 2017-09-27 ENCOUNTER — Emergency Department (HOSPITAL_COMMUNITY): Payer: Medicare Other

## 2017-09-27 ENCOUNTER — Other Ambulatory Visit: Payer: Self-pay

## 2017-09-27 ENCOUNTER — Inpatient Hospital Stay (HOSPITAL_COMMUNITY)
Admission: EM | Admit: 2017-09-27 | Discharge: 2017-10-09 | DRG: 189 | Disposition: A | Discharge status: Skilled Nursing Facility | Payer: Medicare Other | Attending: Internal Medicine | Admitting: Internal Medicine

## 2017-09-27 ENCOUNTER — Encounter (HOSPITAL_COMMUNITY): Payer: Self-pay

## 2017-09-27 DIAGNOSIS — J9620 Acute and chronic respiratory failure, unspecified whether with hypoxia or hypercapnia: Secondary | ICD-10-CM | POA: Insufficient documentation

## 2017-09-27 DIAGNOSIS — J9621 Acute and chronic respiratory failure with hypoxia: Secondary | ICD-10-CM | POA: Diagnosis not present

## 2017-09-27 DIAGNOSIS — D649 Anemia, unspecified: Secondary | ICD-10-CM | POA: Diagnosis not present

## 2017-09-27 DIAGNOSIS — J918 Pleural effusion in other conditions classified elsewhere: Secondary | ICD-10-CM | POA: Diagnosis not present

## 2017-09-27 DIAGNOSIS — Z8249 Family history of ischemic heart disease and other diseases of the circulatory system: Secondary | ICD-10-CM

## 2017-09-27 DIAGNOSIS — Z960 Presence of urogenital implants: Secondary | ICD-10-CM | POA: Diagnosis present

## 2017-09-27 DIAGNOSIS — R64 Cachexia: Secondary | ICD-10-CM | POA: Diagnosis present

## 2017-09-27 DIAGNOSIS — R5381 Other malaise: Secondary | ICD-10-CM | POA: Diagnosis present

## 2017-09-27 DIAGNOSIS — Z515 Encounter for palliative care: Secondary | ICD-10-CM | POA: Diagnosis not present

## 2017-09-27 DIAGNOSIS — Z7901 Long term (current) use of anticoagulants: Secondary | ICD-10-CM

## 2017-09-27 DIAGNOSIS — Z6820 Body mass index (BMI) 20.0-20.9, adult: Secondary | ICD-10-CM

## 2017-09-27 DIAGNOSIS — N049 Nephrotic syndrome with unspecified morphologic changes: Secondary | ICD-10-CM | POA: Diagnosis not present

## 2017-09-27 DIAGNOSIS — J90 Pleural effusion, not elsewhere classified: Secondary | ICD-10-CM | POA: Diagnosis not present

## 2017-09-27 DIAGNOSIS — D508 Other iron deficiency anemias: Secondary | ICD-10-CM | POA: Diagnosis not present

## 2017-09-27 DIAGNOSIS — I9589 Other hypotension: Secondary | ICD-10-CM | POA: Diagnosis not present

## 2017-09-27 DIAGNOSIS — I272 Pulmonary hypertension, unspecified: Secondary | ICD-10-CM | POA: Diagnosis not present

## 2017-09-27 DIAGNOSIS — N08 Glomerular disorders in diseases classified elsewhere: Secondary | ICD-10-CM | POA: Diagnosis present

## 2017-09-27 DIAGNOSIS — Z66 Do not resuscitate: Secondary | ICD-10-CM | POA: Diagnosis not present

## 2017-09-27 DIAGNOSIS — L89152 Pressure ulcer of sacral region, stage 2: Secondary | ICD-10-CM | POA: Diagnosis present

## 2017-09-27 DIAGNOSIS — I509 Heart failure, unspecified: Secondary | ICD-10-CM

## 2017-09-27 DIAGNOSIS — J432 Centrilobular emphysema: Secondary | ICD-10-CM | POA: Diagnosis present

## 2017-09-27 DIAGNOSIS — Z7401 Bed confinement status: Secondary | ICD-10-CM | POA: Diagnosis not present

## 2017-09-27 DIAGNOSIS — Z9981 Dependence on supplemental oxygen: Secondary | ICD-10-CM | POA: Diagnosis not present

## 2017-09-27 DIAGNOSIS — M2550 Pain in unspecified joint: Secondary | ICD-10-CM | POA: Diagnosis not present

## 2017-09-27 DIAGNOSIS — R7989 Other specified abnormal findings of blood chemistry: Secondary | ICD-10-CM | POA: Diagnosis present

## 2017-09-27 DIAGNOSIS — M5030 Other cervical disc degeneration, unspecified cervical region: Secondary | ICD-10-CM | POA: Diagnosis present

## 2017-09-27 DIAGNOSIS — I5043 Acute on chronic combined systolic (congestive) and diastolic (congestive) heart failure: Secondary | ICD-10-CM | POA: Diagnosis not present

## 2017-09-27 DIAGNOSIS — I77810 Thoracic aortic ectasia: Secondary | ICD-10-CM | POA: Diagnosis present

## 2017-09-27 DIAGNOSIS — J449 Chronic obstructive pulmonary disease, unspecified: Secondary | ICD-10-CM | POA: Diagnosis present

## 2017-09-27 DIAGNOSIS — D529 Folate deficiency anemia, unspecified: Secondary | ICD-10-CM | POA: Diagnosis not present

## 2017-09-27 DIAGNOSIS — D528 Other folate deficiency anemias: Secondary | ICD-10-CM | POA: Diagnosis not present

## 2017-09-27 DIAGNOSIS — I481 Persistent atrial fibrillation: Secondary | ICD-10-CM | POA: Diagnosis present

## 2017-09-27 DIAGNOSIS — J439 Emphysema, unspecified: Secondary | ICD-10-CM | POA: Diagnosis not present

## 2017-09-27 DIAGNOSIS — Z85828 Personal history of other malignant neoplasm of skin: Secondary | ICD-10-CM

## 2017-09-27 DIAGNOSIS — I2723 Pulmonary hypertension due to lung diseases and hypoxia: Secondary | ICD-10-CM | POA: Diagnosis present

## 2017-09-27 DIAGNOSIS — E8581 Light chain (AL) amyloidosis: Secondary | ICD-10-CM | POA: Diagnosis not present

## 2017-09-27 DIAGNOSIS — J849 Interstitial pulmonary disease, unspecified: Secondary | ICD-10-CM | POA: Diagnosis not present

## 2017-09-27 DIAGNOSIS — E852 Heredofamilial amyloidosis, unspecified: Secondary | ICD-10-CM | POA: Diagnosis not present

## 2017-09-27 DIAGNOSIS — R319 Hematuria, unspecified: Secondary | ICD-10-CM | POA: Diagnosis present

## 2017-09-27 DIAGNOSIS — J441 Chronic obstructive pulmonary disease with (acute) exacerbation: Secondary | ICD-10-CM | POA: Diagnosis not present

## 2017-09-27 DIAGNOSIS — G63 Polyneuropathy in diseases classified elsewhere: Secondary | ICD-10-CM | POA: Diagnosis not present

## 2017-09-27 DIAGNOSIS — Z811 Family history of alcohol abuse and dependence: Secondary | ICD-10-CM

## 2017-09-27 DIAGNOSIS — N179 Acute kidney failure, unspecified: Secondary | ICD-10-CM | POA: Diagnosis not present

## 2017-09-27 DIAGNOSIS — I4819 Other persistent atrial fibrillation: Secondary | ICD-10-CM | POA: Diagnosis present

## 2017-09-27 DIAGNOSIS — I950 Idiopathic hypotension: Secondary | ICD-10-CM | POA: Diagnosis not present

## 2017-09-27 DIAGNOSIS — J690 Pneumonitis due to inhalation of food and vomit: Secondary | ICD-10-CM | POA: Diagnosis not present

## 2017-09-27 DIAGNOSIS — Z87891 Personal history of nicotine dependence: Secondary | ICD-10-CM

## 2017-09-27 DIAGNOSIS — Z9221 Personal history of antineoplastic chemotherapy: Secondary | ICD-10-CM

## 2017-09-27 DIAGNOSIS — I959 Hypotension, unspecified: Secondary | ICD-10-CM | POA: Diagnosis not present

## 2017-09-27 DIAGNOSIS — R627 Adult failure to thrive: Secondary | ICD-10-CM | POA: Diagnosis not present

## 2017-09-27 DIAGNOSIS — Z9089 Acquired absence of other organs: Secondary | ICD-10-CM

## 2017-09-27 DIAGNOSIS — C8300 Small cell B-cell lymphoma, unspecified site: Secondary | ICD-10-CM | POA: Diagnosis present

## 2017-09-27 DIAGNOSIS — E43 Unspecified severe protein-calorie malnutrition: Secondary | ICD-10-CM | POA: Diagnosis present

## 2017-09-27 DIAGNOSIS — J869 Pyothorax without fistula: Secondary | ICD-10-CM | POA: Diagnosis present

## 2017-09-27 DIAGNOSIS — R0602 Shortness of breath: Secondary | ICD-10-CM | POA: Diagnosis not present

## 2017-09-27 DIAGNOSIS — D6481 Anemia due to antineoplastic chemotherapy: Secondary | ICD-10-CM | POA: Diagnosis present

## 2017-09-27 DIAGNOSIS — E854 Organ-limited amyloidosis: Secondary | ICD-10-CM | POA: Diagnosis present

## 2017-09-27 DIAGNOSIS — J431 Panlobular emphysema: Secondary | ICD-10-CM | POA: Diagnosis not present

## 2017-09-27 DIAGNOSIS — R5383 Other fatigue: Secondary | ICD-10-CM

## 2017-09-27 DIAGNOSIS — Z8546 Personal history of malignant neoplasm of prostate: Secondary | ICD-10-CM

## 2017-09-27 DIAGNOSIS — M6281 Muscle weakness (generalized): Secondary | ICD-10-CM | POA: Diagnosis not present

## 2017-09-27 DIAGNOSIS — R778 Other specified abnormalities of plasma proteins: Secondary | ICD-10-CM | POA: Diagnosis present

## 2017-09-27 DIAGNOSIS — Z800 Family history of malignant neoplasm of digestive organs: Secondary | ICD-10-CM

## 2017-09-27 DIAGNOSIS — R339 Retention of urine, unspecified: Secondary | ICD-10-CM | POA: Diagnosis present

## 2017-09-27 DIAGNOSIS — E8809 Other disorders of plasma-protein metabolism, not elsewhere classified: Secondary | ICD-10-CM | POA: Diagnosis present

## 2017-09-27 DIAGNOSIS — G894 Chronic pain syndrome: Secondary | ICD-10-CM | POA: Diagnosis not present

## 2017-09-27 DIAGNOSIS — R1312 Dysphagia, oropharyngeal phase: Secondary | ICD-10-CM | POA: Diagnosis not present

## 2017-09-27 DIAGNOSIS — I5033 Acute on chronic diastolic (congestive) heart failure: Secondary | ICD-10-CM

## 2017-09-27 DIAGNOSIS — R0902 Hypoxemia: Secondary | ICD-10-CM | POA: Diagnosis not present

## 2017-09-27 DIAGNOSIS — Z8701 Personal history of pneumonia (recurrent): Secondary | ICD-10-CM

## 2017-09-27 DIAGNOSIS — D638 Anemia in other chronic diseases classified elsewhere: Secondary | ICD-10-CM | POA: Diagnosis present

## 2017-09-27 DIAGNOSIS — G8929 Other chronic pain: Secondary | ICD-10-CM | POA: Diagnosis present

## 2017-09-27 DIAGNOSIS — Z79899 Other long term (current) drug therapy: Secondary | ICD-10-CM

## 2017-09-27 DIAGNOSIS — I5032 Chronic diastolic (congestive) heart failure: Secondary | ICD-10-CM | POA: Diagnosis not present

## 2017-09-27 DIAGNOSIS — I081 Rheumatic disorders of both mitral and tricuspid valves: Secondary | ICD-10-CM | POA: Diagnosis present

## 2017-09-27 DIAGNOSIS — Z8041 Family history of malignant neoplasm of ovary: Secondary | ICD-10-CM

## 2017-09-27 DIAGNOSIS — D472 Monoclonal gammopathy: Secondary | ICD-10-CM | POA: Diagnosis not present

## 2017-09-27 DIAGNOSIS — Z885 Allergy status to narcotic agent status: Secondary | ICD-10-CM

## 2017-09-27 DIAGNOSIS — D509 Iron deficiency anemia, unspecified: Secondary | ICD-10-CM | POA: Diagnosis present

## 2017-09-27 DIAGNOSIS — R278 Other lack of coordination: Secondary | ICD-10-CM | POA: Diagnosis not present

## 2017-09-27 DIAGNOSIS — R601 Generalized edema: Secondary | ICD-10-CM | POA: Diagnosis not present

## 2017-09-27 DIAGNOSIS — R680 Hypothermia, not associated with low environmental temperature: Secondary | ICD-10-CM | POA: Diagnosis present

## 2017-09-27 LAB — I-STAT CG4 LACTIC ACID, ED: Lactic Acid, Venous: 1.28 mmol/L (ref 0.5–1.9)

## 2017-09-27 LAB — PREPARE RBC (CROSSMATCH)

## 2017-09-27 LAB — COMPREHENSIVE METABOLIC PANEL
ALT: 11 U/L — ABNORMAL LOW (ref 17–63)
ANION GAP: 11 (ref 5–15)
AST: 18 U/L (ref 15–41)
Albumin: 1.6 g/dL — ABNORMAL LOW (ref 3.5–5.0)
Alkaline Phosphatase: 225 U/L — ABNORMAL HIGH (ref 38–126)
BUN: 36 mg/dL — AB (ref 6–20)
CALCIUM: 7.9 mg/dL — AB (ref 8.9–10.3)
CHLORIDE: 94 mmol/L — AB (ref 101–111)
CO2: 32 mmol/L (ref 22–32)
CREATININE: 1.01 mg/dL (ref 0.61–1.24)
GFR calc Af Amer: >60 mL/min (ref >60)
GLUCOSE: 98 mg/dL (ref 65–99)
POTASSIUM: 4.9 mmol/L (ref 3.5–5.1)
Sodium: 137 mmol/L (ref 135–145)
TOTAL PROTEIN: 4 g/dL — AB (ref 6.5–8.1)
Total Bilirubin: 0.5 mg/dL (ref 0.3–1.2)

## 2017-09-27 LAB — CBC WITH DIFFERENTIAL/PLATELET
Abs Immature Granulocytes: 0 10*3/uL (ref 0.0–0.1)
Basophils Absolute: 0 10*3/uL (ref 0.0–0.1)
Basophils Relative: 0 %
EOS ABS: 0.1 10*3/uL (ref 0.0–0.7)
EOS PCT: 1 %
HEMATOCRIT: 28.4 % — AB (ref 39.0–52.0)
HEMOGLOBIN: 9.1 g/dL — AB (ref 13.0–17.0)
IMMATURE GRANULOCYTES: 0 %
LYMPHS ABS: 0.1 10*3/uL — AB (ref 0.7–4.0)
LYMPHS PCT: 1 %
MCH: 32.6 pg (ref 26.0–34.0)
MCHC: 32 g/dL (ref 30.0–36.0)
MCV: 101.8 fL — AB (ref 78.0–100.0)
MONOS PCT: 8 %
Monocytes Absolute: 0.4 10*3/uL (ref 0.1–1.0)
NEUTROS PCT: 90 %
Neutro Abs: 4.6 10*3/uL (ref 1.7–7.7)
Platelets: 186 10*3/uL (ref 150–400)
RBC: 2.79 MIL/uL — ABNORMAL LOW (ref 4.22–5.81)
RDW: 16 % — AB (ref 11.5–15.5)
WBC: 5.2 10*3/uL (ref 4.0–10.5)

## 2017-09-27 LAB — BRAIN NATRIURETIC PEPTIDE: B Natriuretic Peptide: 1404.8 pg/mL — ABNORMAL HIGH (ref 0.0–100.0)

## 2017-09-27 LAB — TROPONIN I: Troponin I: 0.09 ng/mL (ref <0.03)

## 2017-09-27 MED ORDER — ACYCLOVIR 400 MG PO TABS
400.0000 mg | ORAL_TABLET | Freq: Every day | ORAL | Status: DC
  Administered 2017-09-28 – 2017-10-09 (×12): 400 mg via ORAL
  Filled 2017-09-27 (×12): qty 1

## 2017-09-27 MED ORDER — SODIUM CHLORIDE 0.9% FLUSH
3.0000 mL | Freq: Two times a day (BID) | INTRAVENOUS | Status: DC
  Administered 2017-09-27 – 2017-09-28 (×2): 3 mL via INTRAVENOUS

## 2017-09-27 MED ORDER — MICONAZOLE NITRATE POWD
1.0000 "application " | Freq: Two times a day (BID) | Status: DC
  Administered 2017-09-27 – 2017-10-08 (×17): 1 via TOPICAL

## 2017-09-27 MED ORDER — MIDODRINE HCL 5 MG PO TABS
10.0000 mg | ORAL_TABLET | Freq: Three times a day (TID) | ORAL | Status: DC
  Administered 2017-09-27 – 2017-10-06 (×26): 10 mg via ORAL
  Filled 2017-09-27 (×24): qty 2

## 2017-09-27 MED ORDER — ACETAMINOPHEN 325 MG PO TABS
650.0000 mg | ORAL_TABLET | Freq: Four times a day (QID) | ORAL | Status: DC | PRN
  Administered 2017-10-07: 650 mg via ORAL
  Filled 2017-09-27 (×2): qty 2

## 2017-09-27 MED ORDER — IOPAMIDOL (ISOVUE-370) INJECTION 76%
100.0000 mL | Freq: Once | INTRAVENOUS | Status: AC | PRN
  Administered 2017-09-27: 100 mL via INTRAVENOUS

## 2017-09-27 MED ORDER — POTASSIUM CHLORIDE CRYS ER 20 MEQ PO TBCR: 20.0000 meq | EXTENDED_RELEASE_TABLET | Freq: Two times a day (BID) | ORAL | Status: DC

## 2017-09-27 MED ORDER — VANCOMYCIN HCL IN DEXTROSE 1-5 GM/200ML-% IV SOLN
1000.0000 mg | Freq: Once | INTRAVENOUS | Status: AC
  Administered 2017-09-27: 1000 mg via INTRAVENOUS
  Filled 2017-09-27: qty 200

## 2017-09-27 MED ORDER — SODIUM CHLORIDE 0.9 % IV SOLN: Freq: Once | INTRAVENOUS | Status: DC

## 2017-09-27 MED ORDER — ACETAMINOPHEN 650 MG RE SUPP: 650.0000 mg | Freq: Four times a day (QID) | RECTAL | Status: DC | PRN

## 2017-09-27 MED ORDER — FUROSEMIDE 10 MG/ML IJ SOLN: 40.0000 mg | Freq: Two times a day (BID) | INTRAMUSCULAR | Status: DC

## 2017-09-27 MED ORDER — POTASSIUM CHLORIDE CRYS ER 20 MEQ PO TBCR
20.0000 meq | EXTENDED_RELEASE_TABLET | Freq: Two times a day (BID) | ORAL | Status: DC
  Administered 2017-09-28: 20 meq via ORAL
  Filled 2017-09-27: qty 1

## 2017-09-27 MED ORDER — ONDANSETRON HCL 4 MG PO TABS: 4.0000 mg | ORAL_TABLET | Freq: Four times a day (QID) | ORAL | Status: DC | PRN

## 2017-09-27 MED ORDER — ALBUTEROL SULFATE (2.5 MG/3ML) 0.083% IN NEBU
2.5000 mg | INHALATION_SOLUTION | Freq: Four times a day (QID) | RESPIRATORY_TRACT | Status: DC
  Administered 2017-09-27: 2.5 mg via RESPIRATORY_TRACT
  Filled 2017-09-27: qty 3

## 2017-09-27 MED ORDER — MORPHINE SULFATE 15 MG PO TABS
15.0000 mg | ORAL_TABLET | ORAL | Status: DC | PRN
  Administered 2017-09-28 (×2): 15 mg via ORAL
  Filled 2017-09-27 (×2): qty 1

## 2017-09-27 MED ORDER — ALBUTEROL SULFATE (2.5 MG/3ML) 0.083% IN NEBU
2.5000 mg | INHALATION_SOLUTION | Freq: Three times a day (TID) | RESPIRATORY_TRACT | Status: DC
  Administered 2017-09-28 – 2017-09-30 (×7): 2.5 mg via RESPIRATORY_TRACT
  Filled 2017-09-27 (×7): qty 3

## 2017-09-27 MED ORDER — VANCOMYCIN HCL IN DEXTROSE 750-5 MG/150ML-% IV SOLN: 750.0000 mg | Freq: Two times a day (BID) | INTRAVENOUS | Status: DC

## 2017-09-27 MED ORDER — PROCHLORPERAZINE MALEATE 5 MG PO TABS: 10.0000 mg | ORAL_TABLET | Freq: Four times a day (QID) | ORAL | Status: DC | PRN

## 2017-09-27 MED ORDER — ONDANSETRON HCL 4 MG/2ML IJ SOLN: 4.0000 mg | Freq: Four times a day (QID) | INTRAMUSCULAR | Status: DC | PRN

## 2017-09-27 MED ORDER — IOPAMIDOL (ISOVUE-370) INJECTION 76%
INTRAVENOUS | Status: AC
  Filled 2017-09-27: qty 100

## 2017-09-27 MED ORDER — SODIUM CHLORIDE 0.9 % IV SOLN
2.0000 g | Freq: Once | INTRAVENOUS | Status: AC
  Administered 2017-09-27: 2 g via INTRAVENOUS
  Filled 2017-09-27: qty 2

## 2017-09-27 MED ORDER — SODIUM CHLORIDE 0.9 % IV SOLN: 250.0000 mL | INTRAVENOUS | Status: DC | PRN

## 2017-09-27 MED ORDER — ALBUTEROL SULFATE (2.5 MG/3ML) 0.083% IN NEBU: 2.5000 mg | INHALATION_SOLUTION | RESPIRATORY_TRACT | Status: DC | PRN

## 2017-09-27 MED ORDER — VANCOMYCIN HCL 500 MG IV SOLR
500.0000 mg | Freq: Two times a day (BID) | INTRAVENOUS | Status: DC
  Administered 2017-09-28 (×2): 500 mg via INTRAVENOUS
  Filled 2017-09-27 (×4): qty 500

## 2017-09-27 MED ORDER — SODIUM CHLORIDE 0.9 % IV SOLN
1.0000 g | Freq: Three times a day (TID) | INTRAVENOUS | Status: DC
  Administered 2017-09-28 (×2): 1 g via INTRAVENOUS
  Filled 2017-09-27 (×4): qty 1

## 2017-09-27 MED ORDER — MIDODRINE HCL 5 MG PO TABS
10.0000 mg | ORAL_TABLET | Freq: Once | ORAL | Status: AC
  Administered 2017-09-27: 10 mg via ORAL
  Filled 2017-09-27 (×2): qty 2

## 2017-09-27 MED ORDER — RIVAROXABAN 20 MG PO TABS
20.0000 mg | ORAL_TABLET | Freq: Every day | ORAL | Status: DC
  Administered 2017-09-28 – 2017-10-09 (×12): 20 mg via ORAL
  Filled 2017-09-27 (×12): qty 1

## 2017-09-27 MED ORDER — ALBUMIN HUMAN 5 % IV SOLN
12.5000 g | Freq: Once | INTRAVENOUS | Status: AC
  Administered 2017-09-27: 12.5 g via INTRAVENOUS
  Filled 2017-09-27: qty 250

## 2017-09-27 MED ORDER — SODIUM CHLORIDE 0.9% FLUSH
3.0000 mL | INTRAVENOUS | Status: DC | PRN
  Administered 2017-09-27: 3 mL via INTRAVENOUS
  Filled 2017-09-27: qty 3

## 2017-09-27 NOTE — ED Provider Notes (Signed)
[facility] EMERGENCY DEPARTMENT Provider Note   CSN: [id number] Arrival date & time: 09/27/17  5638     History   Chief Complaint Chief Complaint  Patient presents with  . Loss of Consciousness    HPI [name] is a 72 y.o. male.  HPI   72 yo M with h/o CHF, pulm HTN, lymphoma here with SOB. Pt was just admitted 5/9-5/17 for CHF exacerbation, received heavy diuresis. He states on returning home, he felt increasingly more SOB. He had worsening cough, orthopnea. He has had worsening weakness as well. No vomiting, no fevers. He's been taking his meds as prescribed. He does not know if his weight is up or down; he does admit that he has less swelling than he did prior to his last admission. No fevers. He has diffuse, severe pain that is chronic. No new areas of pain. No other complaints.  Level 5 caveat invoked as remainder of history, ROS, and physical exam limited due to patient's illness, acuity, resp distress.   Past Medical History:  Diagnosis Date  . Anxiety   . Arthritis    "neck" (08/15/2015)  . Chronic bronchitis ([place])   . Chronic diastolic CHF (congestive heart failure) ([place])   . Constipation   . COPD (chronic obstructive pulmonary disease) ([place])   . DDD (degenerative disc disease), cervical   . Depression   . Dilated aortic root ([place])    16m on echo 05/2016  . Edema of left lower extremity   . Facial basal cell cancer   . H/O acne vulgaris 1960s   [facility]  . Headache    history of - left temporal- years ago- not current (08/15/2015)  . Persistent atrial fibrillation (HCC)    on coumadin with CHADS2VASC score of 2  . Pneumonia 1960s; 2015 X 2  . Prostate cancer (HOrr dx'd early 2000s   "low spreading; non aggressive type" (08/15/2015)  . Pulmonary HTN ([place]    PASP 449mg by echo 05/2016  . Skin cancer    "back"    Patient Active Problem List   Diagnosis Date Noted  . Acute on chronic  respiratory failure (HCAtlantic City05/18/2019  . Anemia 09/27/2017  . Pressure injury of sacral region, stage 2 09/24/2017  . Pleural effusion on left   . Elevated troponin 09/19/2017  . Tinea cruris 09/19/2017  . Generalized weakness 09/19/2017  . Diastolic CHF (HCLoveland0575/64/3329. Port or reservoir infection 07/22/2017  . Port-A-Cath in place 07/22/2017  . Palliative care encounter   . Malnutrition of moderate degree 05/02/2017  . Pressure injury of skin 05/01/2017  . Syncope and collapse 04/30/2017  . Right-sided thoracic back pain 03/26/2017  . Insomnia 03/26/2017  . Lymphedema of left arm 03/26/2017  . Malignant lymphoplasmacytic lymphoma (HCCamanche Village11/04/2017  . Counseling regarding advanced care planning and goals of care 03/24/2017  . Pain management contract signed 03/10/2017  . Chronic generalized pain disorder 03/04/2017  . Light chain (AL) amyloidosis (HCC)   . Nephrotic syndrome   . Proteinuria   . Dyspnea   . Left arm swelling   . Acute on chronic diastolic CHF (congestive heart failure) (HCCarter  . Elevated brain natriuretic peptide (BNP) level   . Acute on chronic diastolic (congestive) heart failure (HCCentre Island10/04/2017  . Hypotension 02/21/2017  . Fluid overload 02/21/2017  . Mediastinal lymphadenopathy 02/10/2017  . Adjustment disorder with depressed mood 12/03/2016  . Bacteremia due  to Streptococcus pneumoniae 11/19/2016  . Sepsis ([place]) 11/19/2016  . Hypoalbuminemia 11/19/2016  . Right leg pain 11/19/2016  . AKI (acute kidney injury) ([place]) 11/19/2016  . Acute urinary retention 11/19/2016  . Hyponatremia 11/18/2016  . Aspiration pneumonia ([place]) 11/18/2016  . HCAP (healthcare-associated pneumonia) 11/18/2016  . History of prostate cancer 11/15/2016  . Chronic post-operative pain 11/15/2016  . Persistent atrial fibrillation ([place])   . Acute on chronic combined systolic and diastolic CHF (congestive heart failure) ([place])   . Pulmonary HTN ([place])   . Dilated aortic root ([place])     . Septic shock ([place])   . Pedal edema   . Acute-on-chronic respiratory failure ([place]) 05/21/2016  . Lung mass 05/21/2016  . Loculated pleural effusion 05/21/2016  . Empyema ([place]) 05/21/2016  . S/P thoracentesis   . Bilateral pleural effusion 05/14/2016  . COPD mixed type ([place]) 05/14/2016  . Tobacco abuse 05/14/2016  . Protein-calorie malnutrition, severe ([place]) 05/14/2016    Past Surgical History:  Procedure Laterality Date  . CARDIOVERSION N/A 10/11/2016   Procedure: CARDIOVERSION;  Surgeon: Sanda Klein, MD;  Location: [facility] ENDOSCOPY;  Service: Cardiovascular;  Laterality: N/A;  . CARDIOVERSION N/A 02/26/2017   Procedure: CARDIOVERSION;  Surgeon: [name], MD;  Location[facility] ENDOSCOPY;  Service: Cardiovascular;  Laterality: N/A;  . COLONOSCOPY    . EXCISIONAL HEMORRHOIDECTOMY  1960s  . INGUINAL HERNIA REPAIR Right 2002  . INGUINAL HERNIA REPAIR Bilateral 08/15/2015  . INGUINAL HERNIA REPAIR Bilateral 08/15/2015   Procedure: OPEN REPAIR RECURRENT RIGHT INGUINAL HERNIA WITH MESH AND REPAIR LEFT INGUINAL HERNIA WITH MESH;  Surgeon: [name], MD;  Location: [facility];  Service: General;  Laterality: Bilateral;  . INSERTION OF MESH Bilateral 08/15/2015   Procedure: INSERTION OF MESH;  Surgeon: [name], MD;  Location: [facility];  Service: General;  Laterality: Bilateral;  . IR FLUORO GUIDE PORT INSERTION RIGHT  06/20/2017  . IR THORACENTESIS ASP PLEURAL SPACE W/IMG GUIDE  11/18/2016  . IR US GUIDE VASC ACCESS RIGHT  06/20/2017  . LYMPH NODE BIOPSY Bilateral 10/22/2016   Procedure: LEFT NECK LYMPH NODE BIOPSY;  Surgeon: [name], MD;  Location: [facility];  Service: Thoracic;  Laterality: Bilateral;  . PROSTATE BIOPSY    . TONSILLECTOMY  1950s  . VIDEO ASSISTED THORACOSCOPY (VATS)/EMPYEMA Right 05/20/2016   Procedure: VIDEO ASSISTED THORACOSCOPY (VATS) with drainage of pleural effusion;  Surgeon: [name], MD;  Location: [facility];  Service: Thoracic;  Laterality: Right;  [name] VIDEO  BRONCHOSCOPY WITH ENDOBRONCHIAL ULTRASOUND Right 05/20/2016   Procedure: VIDEO BRONCHOSCOPY WITH ENDOBRONCHIAL ULTRASOUND;  Surgeon: [name], MD;  Location: [facility];  Service: Thoracic;  Laterality: Right;        Home Medications    Prior to Admission medications   Medication Sig Start Date End Date Taking? Authorizing Provider  acyclovir (ZOVIRAX) 400 MG tablet Take 1 tablet (400 mg total) by mouth daily. 08/11/17   Brunetta Genera, MD  amoxicillin-clavulanate (AUGMENTIN) 875-125 MG tablet Take 1 tablet by mouth every 12 (twelve) hours for 2 days. 09/26/17 09/28/17  Eugenie Filler, MD  feeding supplement, ENSURE ENLIVE, (ENSURE ENLIVE) LIQD Take 237 mLs by mouth daily. 09/27/17   Eugenie Filler, MD  folic acid (FOLVITE) 1 MG tablet Take 1 tablet (1 mg total) by mouth daily. 09/27/17   Eugenie Filler, MD  furosemide (LASIX) 40 MG tablet Take 1 tablet (40 mg total) by mouth 2 (two) times daily. 09/26/17   Eugenie Filler,  MD  miconazole nitrate (MICATIN) POWD Apply 1 application topically 2 (two) times daily for 7 days. Apply to rash in groin *SPD Supplied Product* 09/26/17 10/03/17  Eugenie Filler, MD  midodrine (PROAMATINE) 10 MG tablet Take 1 tablet (10 mg total) by mouth 3 (three) times daily with meals. 09/26/17   Eugenie Filler, MD  morphine (MSIR) 15 MG tablet Take 1 tablet (15 mg total) by mouth every 4 (four) hours as needed for moderate pain. 09/26/17   Eugenie Filler, MD  potassium chloride SA (K-DUR,KLOR-CON) 20 MEQ tablet Take 1 tablet (20 mEq total) by mouth 2 (two) times daily. 07/22/17   Brunetta Genera, MD  pregabalin (LYRICA) 50 MG capsule Take 1 capsule (50 mg total) by mouth 2 (two) times daily. 09/26/17   Eugenie Filler, MD  Probiotic Product (PROBIOTIC DAILY PO) Take 1 tablet by mouth daily.    [provider]  prochlorperazine (COMPAZINE) 10 MG tablet Take 1 tablet (10 mg total) every 6 (six) hours as needed by mouth (Nausea or  vomiting). Patient not taking: Reported on 09/18/2017 03/24/17   Brunetta Genera, MD  senna-docusate (SENOKOT-S) 8.6-50 MG tablet Take 1 tablet by mouth 2 (two) times daily. 09/26/17   Eugenie Filler, MD  XARELTO 20 MG TABS tablet TAKE 1 TABLET BY MOUTH EVERY DAY WITH SUPPER 09/05/17   [name], MD    Family History Family History  Problem Relation Age of Onset  . Colon cancer Mother   . Ovarian cancer Mother   . Alcoholism Father   . CAD Father   . Alcohol abuse Father   . Ovarian cancer Sister   . Diabetes Neg Hx   . Stomach cancer Neg Hx     Social History Social History   Tobacco Use  . Smoking status: Former Smoker    Packs/day: 1.00    Years: 62.00    Pack years: 62.00    Types: Cigarettes    Last attempt to quit: 05/14/2016    Years since quitting: 1.3  . Smokeless tobacco: Never Used  Substance Use Topics  . Alcohol use: No    Alcohol/week: 0.0 oz  . Drug use: No    Comment: CBD OIL     Allergies   Demerol [meperidine] and Oxycodone hcl   Review of Systems Review of Systems  Constitutional: Positive for fatigue.  Respiratory: Positive for cough and shortness of breath.   Musculoskeletal: Positive for arthralgias, back pain and myalgias.  Neurological: Positive for syncope and weakness.  All other systems reviewed and are negative.    Physical Exam Updated Vital Signs BP (!) 81/64   Pulse 72   Temp (!) 96 F (35.6 C) (Temporal)   Resp 17   SpO2 99%   Physical Exam  Constitutional: He is oriented to person, place, and time. He appears well-developed and well-nourished. He has a sickly appearance. He appears distressed.  Cachectic, malnourished  HENT:  Head: Normocephalic and atraumatic.  Eyes: Conjunctivae are normal.  Neck: Neck supple.  Cardiovascular: Normal rate, regular rhythm and normal heart sounds. Exam reveals no friction rub.  No murmur heard. Pulmonary/Chest: Effort normal. Tachypnea noted. No respiratory distress. He  has no wheezes. He has rales in the right middle field, the right lower field, the left middle field and the left lower field.  Abdominal: He exhibits no distension.  Musculoskeletal: He exhibits edema (2+ pitting b/l LE).  Neurological: He is alert and oriented to person, place,  and time. He exhibits normal muscle tone.  Skin: Skin is warm. Capillary refill takes less than 2 seconds.  Psychiatric: He has a normal mood and affect.  Nursing note and vitals reviewed.    ED Treatments / Results  Labs (all labs ordered are listed, but only abnormal results are displayed) Labs Reviewed  CBC WITH DIFFERENTIAL/PLATELET - Abnormal; Notable for the following components:      Result Value   RBC 2.79 (*)    Hemoglobin 9.1 (*)    HCT 28.4 (*)    MCV 101.8 (*)    RDW 16.0 (*)    Lymphs Abs 0.1 (*)    All other components within normal limits  COMPREHENSIVE METABOLIC PANEL - Abnormal; Notable for the following components:   Chloride 94 (*)    BUN 36 (*)    Calcium 7.9 (*)    Total Protein 4.0 (*)    Albumin 1.6 (*)    ALT 11 (*)    Alkaline Phosphatase 225 (*)    All other components within normal limits  BRAIN NATRIURETIC PEPTIDE - Abnormal; Notable for the following components:   B Natriuretic Peptide 1,404.8 (*)    All other components within normal limits  TROPONIN I - Abnormal; Notable for the following components:   Troponin I 0.09 (*)    All other components within normal limits  CULTURE, BLOOD (ROUTINE X 2)  CULTURE, BLOOD (ROUTINE X 2)  CULTURE, EXPECTORATED SPUTUM-ASSESSMENT  GRAM STAIN  STREP PNEUMONIAE URINARY ANTIGEN  CBC  COMPREHENSIVE METABOLIC PANEL  I-STAT CG4 LACTIC ACID, ED  TYPE AND SCREEN  PREPARE RBC (CROSSMATCH)    EKG EKG Interpretation  Date/Time:  Saturday Sep 27 2017 09:03:18 EDT Ventricular Rate:  76 PR Interval:    QRS Duration: 131 QT Interval:  440 QTC Calculation: 495 R Axis:   79 Text Interpretation:  Atrial fibrillation Ventricular  premature complex IVCD, consider atypical RBBB Nonspecific T abnormalities, lateral leads No significant change since last tracing Confirmed by Duffy Bruce [phone]) on 09/27/2017 9:09:56 AM   Radiology Ct Angio Chest Pe W Or Wo Contrast  Result Date: 09/27/2017 CLINICAL DATA:  72 year old male with a history of congestive heart failure EXAM: CT ANGIOGRAPHY CHEST WITH CONTRAST TECHNIQUE: Multidetector CT imaging of the chest was performed using the standard protocol during bolus administration of intravenous contrast. Multiplanar CT image reconstructions and MIPs were obtained to evaluate the vascular anatomy. CONTRAST:  126m ISOVUE-370 IOPAMIDOL (ISOVUE-370) INJECTION 76% COMPARISON:  PET-CT 04/04/2017, abdominal CT 06/12/2017 FINDINGS: Cardiovascular: Heart: Redemonstration of cardiomegaly. Improved pericardial effusion compared to the prior. Calcifications of left anterior descending, right, circumflex coronary arteries. Aorta: Calcifications of the aortic arch and the branch vessels. Calcifications of descending thoracic aorta. Pulmonary arteries: No central, lobar, segmental, or proximal subsegmental filling defects. Mediastinum/Nodes: Mediastinal adenopathy, with paratracheal lymphadenopathy. Largest nodal mass in the left paratracheal nodal station. No endotracheal debris. Debris within the right and left lower lobe bronchi. Unremarkable thyroid. Lungs/Pleura: Centrilobular and paraseptal emphysema. No significant interlobular septal thickening. Multifocal airspace opacity, in a predominantly dependent distribution of the bilateral lungs, involving bilateral upper lobes, middle lobe, bilateral lower lobes. Endobronchial debris in the bilateral lower lobes. Bilateral pleural effusions. There is evidence of complex right-sided pleural effusion with partial loculation along the medial aspect of the right lower lobe. Partially calcified right hilar and right mediastinal lymph nodes. Upper Abdomen: Poorly  evaluated upper abdomen. Musculoskeletal: Cachectic appearance of the chest wall/abdomen wall with edema. No acute displaced fracture identified. Scoliotic  curvature of the thoracic spine with degenerative changes. No bony canal narrowing. Rib deformities of prior partially healed rib fractures on the right. Right IJ port catheter. Review of the MIP images confirms the above findings. IMPRESSION: CT negative for pulmonary emboli. Worsening dependent airspace disease and pleural effusions throughout all lobes, favored to represent sequela of aspiration and aspiration pneumonia/pneumonitis. There is evidence of partial loculation of right-sided pleural fluid, potentially representing developing empyema versus complex pleural fluid. Reactive mediastinal lymphadenopathy. Partially calcified lymph nodes of the mediastinum, compatible with prior granulomatous disease. Emphysema (ICD10-J43.9). Improved pericardial effusion compared to the prior CT. Cardiomegaly. Coronary artery disease. Aortic Atherosclerosis (ICD10-I70.0). Electronically Signed   By: Corrie Mckusick D.O.   On: 09/27/2017 13:09   Dg Chest Portable 1 View  Result Date: 09/27/2017 CLINICAL DATA:  Shortness of breath with hypoxia. History of lymphoma. EXAM: PORTABLE CHEST 1 VIEW COMPARISON:  09/23/2017 and 05/09/2017 FINDINGS: Right IJ Port-A-Cath with tip over the right atrium unchanged. Stable opacification over the right midlung as well as bilateral perihilar regions and lung bases left worse than right. Mild stable cardiomegaly. Remainder of the exam is unchanged. IMPRESSION: Persistent airspace opacification over the right midlung with bilateral perihilar and bibasilar opacification left worse than right. Findings likely due to infection with bilateral effusions/basilar atelectasis. Stable cardiomegaly. Right IJ Port-A-Cath with tip over the right atrium unchanged. Electronically Signed   By: Marin Olp M.D.   On: 09/27/2017 09:21     Procedures .Critical Care Performed by: Duffy Bruce, MD Authorized by: Duffy Bruce, MD   Critical care provider statement:    Critical care time (minutes):  35   Critical care time was exclusive of:  Separately billable procedures and treating other patients and teaching time   Critical care was necessary to treat or prevent imminent or life-threatening deterioration of the following conditions:  Cardiac failure, circulatory failure, dehydration and sepsis   Critical care was time spent personally by me on the following activities:  Development of treatment plan with patient or surrogate, discussions with consultants, evaluation of patient's response to treatment, examination of patient, obtaining history from patient or surrogate, ordering and performing treatments and interventions, ordering and review of laboratory studies, ordering and review of radiographic studies, pulse oximetry, re-evaluation of patient's condition and review of old charts   I assumed direction of critical care for this patient from another provider in my specialty: no     (including critical care time)  Medications Ordered in ED Medications  ceFEPIme (MAXIPIME) 1 g in sodium chloride 0.9 % 100 mL IVPB (has no administration in time range)  vancomycin (VANCOCIN) 500 mg in sodium chloride 0.9 % 100 mL IVPB (has no administration in time range)  iopamidol (ISOVUE-370) 76 % injection (has no administration in time range)  acyclovir (ZOVIRAX) tablet 400 mg (has no administration in time range)  miconazole nitrate (MICATIN) topical powder 1 application (has no administration in time range)  midodrine (PROAMATINE) tablet 10 mg (has no administration in time range)  morphine (MSIR) tablet 15 mg (has no administration in time range)  prochlorperazine (COMPAZINE) tablet 10 mg (has no administration in time range)  rivaroxaban (XARELTO) tablet 20 mg (has no administration in time range)  sodium chloride flush  (NS) 0.9 % injection 3 mL (has no administration in time range)  sodium chloride flush (NS) 0.9 % injection 3 mL (has no administration in time range)  0.9 %  sodium chloride infusion (has no administration in time range)  acetaminophen (  TYLENOL) tablet 650 mg (has no administration in time range)    Or  acetaminophen (TYLENOL) suppository 650 mg (has no administration in time range)  ondansetron (ZOFRAN) tablet 4 mg (has no administration in time range)    Or  ondansetron (ZOFRAN) injection 4 mg (has no administration in time range)  albuterol (PROVENTIL) (2.5 MG/3ML) 0.083% nebulizer solution 2.5 mg (2.5 mg Nebulization Not Given 09/27/17 1557)  0.9 %  sodium chloride infusion (has no administration in time range)  potassium chloride SA (K-DUR,KLOR-CON) CR tablet 20 mEq (has no administration in time range)  midodrine (PROAMATINE) tablet 10 mg (10 mg Oral Given 09/27/17 1349)  vancomycin (VANCOCIN) IVPB 1000 mg/200 mL premix (0 mg Intravenous Stopped 09/27/17 1230)  ceFEPIme (MAXIPIME) 2 g in sodium chloride 0.9 % 100 mL IVPB (0 g Intravenous Stopped 09/27/17 1111)  albumin human 5 % solution 12.5 g (12.5 g Intravenous New Bag/Given 09/27/17 1313)  iopamidol (ISOVUE-370) 76 % injection 100 mL (100 mLs Intravenous Contrast Given 09/27/17 1200)     Initial Impression / Assessment and Plan / ED Course  I have reviewed the triage vital signs and the nursing notes.  Pertinent labs & imaging results that were available during my care of the patient were reviewed by me and considered in my medical decision making (see chart for details).  Clinical Course as of Sep 28 1826  Sat Sep 27, 2017  0934 72 yo F with PMHx including CHF, COPD, lymphoma, s/p long admission for CHF here with SOB. On arrival, pt hypoxic w/ diffuse rales. Concern for recurrent CHF, PNA. Will check CXR, labs. BP 628 systolic on arrival, which is baseline. Midodrine given (for his AM dose).   [CI]  1052 Brain natriuretic  peptide(!) [CI]    Clinical Course User Index [CI] Duffy Bruce, MD    Lab work shows persistent, markedly elevated BNP. Normal LA and CBC which is reassuring in setting of borderline hypotension, which I suspect is his baseline. CT scan is c/f aspiration PNA, pulmonary edema. Suspect his presentation is multifactorial 2/2 a combination of aspiration PNA, CHF, and pulmonary edema 2/2 hypoalbuminemia. Will give dose of albumin, admit, continue ABX. Broad-spectrum ABX given.  Final Clinical Impressions(s) / ED Diagnoses   Final diagnoses:  Aspiration pneumonia, unspecified aspiration pneumonia type, unspecified laterality, unspecified part of lung ([place])  Acute on chronic congestive heart failure, unspecified heart failure type ([place])  Failure to thrive in adult    ED Discharge Orders    None       Duffy Bruce, MD 09/27/17 [place]

## 2017-09-27 NOTE — Progress Notes (Addendum)
Pharmacy Antibiotic Note  [name] is a 72 y.o. male who was just discharged yesterday after treating for CHF exacerbation.  Patient returned on 09/27/2017 with respiratory distress and Pharmacy has been consulted for vancomycin and cefepime dosing for sepsis.  SCr 1.01, CrCL 58 ml/min, afebrile, WBC WNL, LA 1.28.  Plan: Vanc 1gm IV x 1, then 529m IV Q12H Cefepime 2gm IV x 1, then 1gm IV Q8H Monitor renal fxn, clinical progress, vanc trough at Css  F/U continuing anticoagulation     Temp (24hrs), Avg:96 F (35.6 C), Min:96 F (35.6 C), Max:96 F (35.6 C)  Recent Labs  Lab 09/21/17 1442 09/22/17 0500 09/23/17 0500 09/24/17 0945 09/25/17 0345 09/26/17 0455 09/27/17 0928  WBC 6.4  --   --  6.4 5.7 5.3  --   CREATININE  --  1.10 1.19 1.14 1.20 0.94  --   LATICACIDVEN  --   --   --   --   --   --  1.28    Estimated Creatinine Clearance: 62.7 mL/min (by C-G formula based on SCr of 0.94 mg/dL).    Allergies  Allergen Reactions  . Demerol [Meperidine] Other (See Comments)    UNSPECIFIED REACTION  Causes system to shutdown. ?   . Oxycodone Hcl Other (See Comments)    Pt states this medication 'wires him up' and makes pt hyper; pt does not want to take this ever again     Vanc 5/18 >> Cefepime 5/18 >>  5/18 BCx -   Last admit: Augmentin 5/12 >> 5/17 Acyclovir 5/9 >> 5/17   [name] D. DMina Marble PharmD, BCPS, BCCCP Pager:  [phone]/18/2019, 9:40 AM

## 2017-09-27 NOTE — ED Notes (Signed)
Attempted report x 1; name and call back number provided

## 2017-09-27 NOTE — ED Triage Notes (Signed)
PT arrives via EMS from home where he was hypoxic on home o2. Initially spo2 was 82% on 5lpm [place] with bp 80/60. When attempting to sit pt up he had syncopal episode and was unresponsive for ~5 mins. PT placed on 15lpm NRB and spo2 increased to 90%. 161m ns administered and bp 96/50. Pt a&o on arrival to ED. Full code. Pt was dc from inpt this morning.

## 2017-09-27 NOTE — ED Notes (Signed)
Pt placed on air bed for comfort; skin breakdown noted to sacral area through dermis, wound bed weepy and red

## 2017-09-27 NOTE — Consult Note (Signed)
Cardiology Consultation:   Patient ID: BRAYEN BUNN; [id number]; March 17, 1946   Admit date: 09/27/2017 Date of Consult: 09/27/2017  Primary Care Provider: Biagio Borg, MD Primary Cardiologist: [name], MD     Patient Profile:   [name] is a 72 y.o. male with a hx of chronic diastolic congestive heart failure, permanent atrial fibrillation, prostate cancer, COPD, lymphoplasmacytic lymphoma, chronic debilitation for evaluation of acute on chronic diastolic congestive heart failure at the request of Duffy Bruce, MD.  History of Present Illness:   Patient just discharged following prolonged admission for volume excess/acute on chronic diastolic congestive heart failure, chronic debilitation and hypotension.  Echocardiogram May 2019 showed normal LV function, moderate left ventricular hypertrophy, severe left atrial enlargement and mild mitral regurgitation.  Albumin was 1.7 and it was felt that the majority of his anasarca was secondary to low oncotic pressure.  He was diuresed with significant improvement.  He remained on midodrine for chronic hypotension.  He also had a Foley catheter placed.  Apparently following discharge the patient developed dyspnea and hypoxia.  He complains of "pain all over".  He has a cough that is nonproductive.  He denies fevers or chills or chest pain.  He was brought back to the emergency room for further evaluation and cardiology asked to evaluate.  Past Medical History:  Diagnosis Date  . Anxiety   . Arthritis    "neck" (08/15/2015)  . Chronic bronchitis ([place])   . Chronic diastolic CHF (congestive heart failure) ([place])   . Constipation   . COPD (chronic obstructive pulmonary disease) ([place])   . DDD (degenerative disc disease), cervical   . Depression   . Dilated aortic root ([place])    65m on echo 05/2016  . Edema of left lower extremity   . Facial basal cell cancer   . H/O acne vulgaris 1960s   [facility]  . Headache    history of - left temporal- years ago- not current (08/15/2015)  . Persistent atrial fibrillation ([facility])    on coumadin with CHADS2VASC score of 2  . Pneumonia 1960s; 2015 X 2  . Prostate cancer ([facility] dx'd early 2000s   "low spreading; non aggressive type" (08/15/2015)  . Pulmonary HTN ([place]    PASP 420mg by echo 05/2016  . Skin cancer    "back"    Past Surgical History:  Procedure Laterality Date  . CARDIOVERSION N/A 10/11/2016   Procedure: CARDIOVERSION;  Surgeon: CrSanda KleinMD;  [facility];  Service: Cardiovascular;  Laterality: N/A;  . CARDIOVERSION N/A 02/26/2017   Procedure: CARDIOVERSION;  Surgeon: McLarey DresserMD;  [facility];  Service: Cardiovascular;  Laterality: N/A;  . COLONOSCOPY    . EXCISIONAL HEMORRHOIDECTOMY  1960s  . INGUINAL HERNIA REPAIR Right 2002  . INGUINAL HERNIA REPAIR Bilateral 08/15/2015  . INGUINAL HERNIA REPAIR Bilateral 08/15/2015   Procedure: OPEN REPAIR RECURRENT RIGHT INGUINAL HERNIA WITH MESH AND REPAIR LEFT INGUINAL HERNIA WITH MESH;  Surgeon: HaFanny SkatesMD;  [facility]: [facility];  Laterality: Bilateral;  . INSERTION OF MESH Bilateral 08/15/2015   Procedure: INSERTION OF MESH;  Surgeon: HaFanny SkatesMD;  [facility] Service: General;  Laterality: Bilateral;  . IR FLUORO GUIDE PORT INSERTION RIGHT  06/20/2017  . IR THORACENTESIS ASP PLEURAL SPACE W/IMG GUIDE  11/18/2016  . IR [place] VASC ACCESS RIGHT  06/20/2017  . LYMPH NODE BIOPSY Bilateral 10/22/2016  Procedure: LEFT NECK LYMPH NODE BIOPSY;  Surgeon: [name], MD;  Location: [facility];  Service: Thoracic;  Laterality: Bilateral;  . PROSTATE BIOPSY    . TONSILLECTOMY  1950s  . VIDEO ASSISTED THORACOSCOPY (VATS)/EMPYEMA Right 05/20/2016   Procedure: VIDEO ASSISTED THORACOSCOPY (VATS) with drainage of pleural effusion;  Surgeon: [name], MD;  Location: [facility];  Service: Thoracic;  Laterality: Right;  [name] VIDEO  BRONCHOSCOPY WITH ENDOBRONCHIAL ULTRASOUND Right 05/20/2016   Procedure: VIDEO BRONCHOSCOPY WITH ENDOBRONCHIAL ULTRASOUND;  Surgeon: [name], MD;  Location: [facility] OR;  Service: Thoracic;  Laterality: Right;      Inpatient Medications: Scheduled Meds: . acyclovir  400 mg Oral Daily  . albuterol  2.5 mg Nebulization Q6H  . iopamidol      . miconazole nitrate  1 application Topical BID  . midodrine  10 mg Oral TID WC  . potassium chloride SA  20 mEq Oral BID  . rivaroxaban  20 mg Oral Daily  . sodium chloride flush  3 mL Intravenous Q12H   Continuous Infusions: . sodium chloride    . ceFEPime (MAXIPIME) IV    . vancomycin     PRN Meds: sodium chloride, acetaminophen **OR** acetaminophen, morphine, ondansetron **OR** ondansetron (ZOFRAN) IV, prochlorperazine, sodium chloride flush  Allergies:    Allergies  Allergen Reactions  . Demerol [Meperidine] Other (See Comments)    UNSPECIFIED REACTION  Causes system to shutdown. ?   . Oxycodone Hcl Other (See Comments)    Pt states this medication 'wires him up' and makes pt hyper; pt does not want to take this ever again    Social History:   Social History   Socioeconomic History  . Marital status: Married    Spouse name: Not on file  . Number of children: Not on file  . Years of education: Not on file  . Highest education level: Not on file  Occupational History  . Occupation: Retired  Scientific laboratory technician  . Financial resource strain: Not on file  . Food insecurity:    Worry: Not on file    Inability: Not on file  . Transportation needs:    Medical: Not on file    Non-medical: Not on file  Tobacco Use  . Smoking status: Former Smoker    Packs/day: 1.00    Years: 62.00    Pack years: 62.00    Types: Cigarettes    Last attempt to quit: 05/14/2016    Years since quitting: 1.3  . Smokeless tobacco: Never Used  Substance and Sexual Activity  . Alcohol use: No    Alcohol/week: 0.0 oz  . Drug use: No    Comment: CBD OIL  .  Sexual activity: Not Currently    Partners: Female  Lifestyle  . Physical activity:    Days per week: Not on file    Minutes per session: Not on file  . Stress: Not on file  Relationships  . Social connections:    Talks on phone: Not on file    Gets together: Not on file    Attends religious service: Not on file    Active member of club or organization: Not on file    Attends meetings of clubs or organizations: Not on file    Relationship status: Not on file  . Intimate partner violence:    Fear of current or ex partner: Not on file    Emotionally abused: Not on file    Physically abused: Not on file  Forced sexual activity: Not on file  Other Topics Concern  . Not on file  Social History Narrative   Patient lives with his wife who is a great deal of help in his care    Family History:    Family History  Problem Relation Age of Onset  . Colon cancer Mother   . Ovarian cancer Mother   . Alcoholism Father   . CAD Father   . Alcohol abuse Father   . Ovarian cancer Sister   . Diabetes Neg Hx   . Stomach cancer Neg Hx      ROS:  Please see the history of present illness.  Patient states he is weak and hurts all over.  He has a nonproductive cough.  Poor appetite. All other ROS reviewed and negative.     Physical Exam/Data:   Vitals:   09/27/17 1200 09/27/17 1215 09/27/17 1315 09/27/17 1500  BP: (!) 83/60 (!) 80/63 (!) 80/59 (!) 81/64  Pulse:    72  Resp: _0 Temp:      TempSrc:      SpO2:    99%    Intake/Output Summary (Last 24 hours) at 09/27/2017 1516 Last data filed at 09/27/2017 1111 Gross per 24 hour  Intake 100 ml  Output -  Net 100 ml   There were no vitals filed for this visit. There is no height or weight on file to calculate BMI.  General:  Well nourished, cachectic male in no acute distress HEENT: normal Neck: no JVD Vascular: No carotid bruits; FA pulses 2+ bilaterally without bruits  Cardiac:  Irregular; no murmur Lungs:   Diminished BS RLL Abd: soft, nontender, no hepatomegaly, presacral edema  Ext: 2+ ankle edema Musculoskeletal:  No deformities Skin: warm and dry  Neuro:  Grossly intact Psych:  Normal affect   EKG:  The EKG was personally reviewed and demonstrates: Atrial fibrillation with PVCs or aberrantly conducted beats.  Incomplete right bundle branch block.  Nonspecific T wave changes.   Laboratory Data:  Chemistry Recent Labs  Lab 09/25/17 0345 09/26/17 0455 09/27/17 0917  NA 141 139 137  K 4.8 4.4 4.9  CL 98* 94* 94*  CO2 32 35* 32  GLUCOSE 144* 104* 98  BUN 41* 39* 36*  CREATININE 1.20 0.94 1.01  CALCIUM 8.4* 8.1* 7.9*  GFRNONAA 58* >60 >60  GFRAA >60 >60 >60  ANIONGAP _1 Recent Labs  Lab 09/27/17 0917  PROT 4.0*  ALBUMIN 1.6*  AST 18  ALT 11*  ALKPHOS 225*  BILITOT 0.5   Hematology Recent Labs  Lab 09/25/17 0345 09/26/17 0455 09/27/17 0917  WBC 5.7 5.3 5.2  RBC 2.86* 2.79* 2.79*  HGB 9.5* 9.3* 9.1*  HCT 29.0* 28.7* 28.4*  MCV 101.4* 102.9* 101.8*  MCH 33.2 33.3 32.6  MCHC 32.8 32.4 32.0  RDW 16.0* 15.6* 16.0*  PLT 255 228 186   Cardiac Enzymes Recent Labs  Lab 09/27/17 0917  TROPONINI 0.09*   BNP Recent Labs  Lab 09/22/17 0500 09/27/17 0918  BNP 1,282.2* 1,404.8*    Radiology/Studies:  Ct Angio Chest Pe W Or Wo Contrast  Result Date: 09/27/2017 CLINICAL DATA:  72 year old male with a history of congestive heart failure EXAM: CT ANGIOGRAPHY CHEST WITH CONTRAST TECHNIQUE: Multidetector CT imaging of the chest was performed using the standard protocol during bolus administration of intravenous contrast. Multiplanar CT image reconstructions and MIPs were obtained to evaluate the vascular anatomy. CONTRAST:  155m  ISOVUE-370 IOPAMIDOL (ISOVUE-370) INJECTION 76% COMPARISON:  PET-CT 04/04/2017, abdominal CT 06/12/2017 FINDINGS: Cardiovascular: Heart: Redemonstration of cardiomegaly. Improved pericardial effusion compared to the prior.  Calcifications of left anterior descending, right, circumflex coronary arteries. Aorta: Calcifications of the aortic arch and the branch vessels. Calcifications of descending thoracic aorta. Pulmonary arteries: No central, lobar, segmental, or proximal subsegmental filling defects. Mediastinum/Nodes: Mediastinal adenopathy, with paratracheal lymphadenopathy. Largest nodal mass in the left paratracheal nodal station. No endotracheal debris. Debris within the right and left lower lobe bronchi. Unremarkable thyroid. Lungs/Pleura: Centrilobular and paraseptal emphysema. No significant interlobular septal thickening. Multifocal airspace opacity, in a predominantly dependent distribution of the bilateral lungs, involving bilateral upper lobes, middle lobe, bilateral lower lobes. Endobronchial debris in the bilateral lower lobes. Bilateral pleural effusions. There is evidence of complex right-sided pleural effusion with partial loculation along the medial aspect of the right lower lobe. Partially calcified right hilar and right mediastinal lymph nodes. Upper Abdomen: Poorly evaluated upper abdomen. Musculoskeletal: Cachectic appearance of the chest wall/abdomen wall with edema. No acute displaced fracture identified. Scoliotic curvature of the thoracic spine with degenerative changes. No bony canal narrowing. Rib deformities of prior partially healed rib fractures on the right. Right IJ port catheter. Review of the MIP images confirms the above findings. IMPRESSION: CT negative for pulmonary emboli. Worsening dependent airspace disease and pleural effusions throughout all lobes, favored to represent sequela of aspiration and aspiration pneumonia/pneumonitis. There is evidence of partial loculation of right-sided pleural fluid, potentially representing developing empyema versus complex pleural fluid. Reactive mediastinal lymphadenopathy. Partially calcified lymph nodes of the mediastinum, compatible with prior granulomatous  disease. Emphysema (ICD10-J43.9). Improved pericardial effusion compared to the prior CT. Cardiomegaly. Coronary artery disease. Aortic Atherosclerosis (ICD10-I70.0). Electronically Signed   By: Corrie Mckusick D.O.   On: 09/27/2017 13:09   Dg Chest Portable 1 View  Result Date: 09/27/2017 CLINICAL DATA:  Shortness of breath with hypoxia. History of lymphoma. EXAM: PORTABLE CHEST 1 VIEW COMPARISON:  09/23/2017 and 05/09/2017 FINDINGS: Right IJ Port-A-Cath with tip over the right atrium unchanged. Stable opacification over the right midlung as well as bilateral perihilar regions and lung bases left worse than right. Mild stable cardiomegaly. Remainder of the exam is unchanged. IMPRESSION: Persistent airspace opacification over the right midlung with bilateral perihilar and bibasilar opacification left worse than right. Findings likely due to infection with bilateral effusions/basilar atelectasis. Stable cardiomegaly. Right IJ Port-A-Cath with tip over the right atrium unchanged. Electronically Signed   By: Marin Olp M.D.   On: 09/27/2017 09:21   Dg Chest Port 1 View  Result Date: 09/23/2017 CLINICAL DATA:  Short of breath EXAM: PORTABLE CHEST 1 VIEW COMPARISON:  09/22/2017 FINDINGS: Extensive airspace disease in the right lung is unchanged. Right pleural effusion unchanged Progression of airspace disease in the left lung most notably in the left lung base. Interval development of small left effusion. Port-A-Cath tip in the SVC. IMPRESSION: Stable airspace disease and pleural effusion on the right Progression of airspace disease and pleural effusion on the left. Findings may be due to congestive heart failure versus pneumonia. Electronically Signed   By: Franchot Gallo M.D.   On: 09/23/2017 19:48    Assessment and Plan:   1. Volume excess/acute on chronic diastolic congestive heart failure-patient's volume excess/anasarca is predominantly secondary to low oncotic pressure as his albumin is 1.6.  He is  receiving albumin in the emergency room.  His treatment is complicated by chronic hypotension requiring midodrine and his blood pressure is low.  Would  hold diuresis for now and resume when blood pressure improves.  His LV function is normal. 2. Permanent atrial fibrillation-patient's rate is controlled on no medications.  Continue Xarelto. CHADSvasc 2.  3. 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 Aundra Dubin although treatment options in his present condition are limited. 4. Orthostatic hypotension-continue midodrine. 5. Possible pneumonia-anabiotic's per primary care. 6. History of nephrotic syndrome  Overall prognosis is poor.  Would consider palliative care consult.   For questions or updates, please contact [place] Please consult [email or web address] for contact info under Cardiology/STEMI.   Signed, Kirk Ruths, MD  09/27/2017 3:16 PM

## 2017-09-27 NOTE — H&P (Addendum)
History and Physical    [name]:[id number] DOB: [birth year] DOA: 09/27/2017  PCP: Biagio Borg, MD Patient coming from: home  Chief Complaint: hypoxia  HPI: [name] is a  72 y.o. 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 [facility] are asked to admit.  Information is obtained from the patient and the wife who is at the bedside as well as the chart. Patient was discharged from [facility] hospital last evening at 10 PM. Chart review indicates patient in the hospital diuresed 7 L. Wife states there was no worsening shortness of breath no increase in coughing or sputum production but noted that his oxygen saturation level was 82%. She states he's usually on 3 L oxygen at home was discharged yesterday on 4 L. This morning on 5 L and saturation level 82% she called EMS. EMS arrived reported up blood pressure of 80/60. Reportedly patient was attempting to set up had a syncopal episode. States they put a mask on him. Chart review indicates oxygen saturation level came up to 90%. He was also provided with 150 mils of normal saline blood pressure increased to 96/50. Wife reports he was also sent home on antibiotic and his Lasix was changed to "a pill". No reports of any nausea vomiting no complaints of chest pain palpitations. No fever chills headache.  ED Course: in the emergency department he's afebrile hypotensive with a systolic blood pressure range 80-90 which is close to his baseline. No tachycardia no tachypnea and saturation level 92% on nasal  cannula. IV antibiotics initiated in the emergency department he is also provided with one dose of midodrina and albumin.  Review of Systems: As per HPI otherwise all other systems reviewed and are negative.   Ambulatory Status: non-ambulatory  Past Medical History:  Diagnosis Date  . Anxiety   . Arthritis    "neck" (08/15/2015)  . Chronic bronchitis ([place])   . Chronic diastolic CHF (congestive heart failure) ([place])   . Constipation   . COPD (chronic obstructive pulmonary disease) ([place])   . DDD (degenerative disc disease), cervical   . Depression   . Dilated aortic root ([place])    48m on echo 05/2016  . Edema of left lower extremity   . Facial basal cell cancer   . H/O acne vulgaris 1960s   [facility]  . Headache    history of - left temporal- years ago- not current (08/15/2015)  . Persistent atrial fibrillation (HCC)    on coumadin with CHADS2VASC score of 2  . Pneumonia 1960s; 2015 X 2  . Prostate cancer ([place] [employer] dx'd early 2000s   "low spreading; non aggressive type" (08/15/2015)  . Pulmonary HTN ([place]    PASP 425mg by echo 05/2016  . Skin cancer    "back"    Past Surgical History:  Procedure Laterality Date  . CARDIOVERSION N/A 10/11/2016   Procedure: CARDIOVERSION;  Surgeon: CrSanda KleinMD;  [facility];  Service: Cardiovascular;  Laterality: N/A;  . CARDIOVERSION N/A 02/26/2017   Procedure: CARDIOVERSION;  Surgeon: McLarey DresserMD;  Location: [facility] Service: Cardiovascular;  Laterality: N/A;  . COLONOSCOPY    . EXCISIONAL HEMORRHOIDECTOMY  1960s  . INGUINAL HERNIA REPAIR Right 2002  . INGUINAL HERNIA REPAIR Bilateral 08/15/2015  . INGUINAL HERNIA REPAIR Bilateral 08/15/2015   Procedure: OPEN REPAIR RECURRENT RIGHT INGUINAL HERNIA WITH MESH AND REPAIR LEFT INGUINAL HERNIA WITH MESH;  Surgeon: [name], MD;  Location: [place];  Service: General;  Laterality: Bilateral;  . INSERTION OF MESH Bilateral 08/15/2015     Procedure: INSERTION OF MESH;  Surgeon: [name], MD;  Location: [place];  Service: General;  Laterality: Bilateral;  . IR FLUORO GUIDE PORT INSERTION RIGHT  06/20/2017  . IR THORACENTESIS ASP PLEURAL SPACE W/IMG GUIDE  11/18/2016  . IR US GUIDE VASC ACCESS RIGHT  06/20/2017  . LYMPH NODE BIOPSY Bilateral 10/22/2016   Procedure: LEFT NECK LYMPH NODE BIOPSY;  Surgeon: [name], MD;  Location: [place];  Service: Thoracic;  Laterality: Bilateral;  . PROSTATE BIOPSY    . TONSILLECTOMY  1950s  . VIDEO ASSISTED THORACOSCOPY (VATS)/EMPYEMA Right 05/20/2016   Procedure: VIDEO ASSISTED THORACOSCOPY (VATS) with drainage of pleural effusion;  Surgeon: [name], MD;  Location: [place];  Service: Thoracic;  Laterality: Right;  [name] VIDEO BRONCHOSCOPY WITH ENDOBRONCHIAL ULTRASOUND Right 05/20/2016   Procedure: VIDEO BRONCHOSCOPY WITH ENDOBRONCHIAL ULTRASOUND;  Surgeon: [name], MD;  Location: [facility];  Service: Thoracic;  Laterality: Right;    Social History   Socioeconomic History  . Marital status: Married    Spouse name: Not on file  . Number of children: Not on file  . Years of education: Not on file  . Highest education level: Not on file  Occupational History  . Occupation: Retired  Scientific laboratory technician  . Financial resource strain: Not on file  . Food insecurity:    Worry: Not on file    Inability: Not on file  . Transportation needs:    Medical: Not on file    Non-medical: Not on file  Tobacco Use  . Smoking status: Former Smoker    Packs/day: 1.00    Years: 62.00    Pack years: 62.00    Types: Cigarettes    Last attempt to quit: 05/14/2016    Years since quitting: 1.3  . Smokeless tobacco: Never Used  Substance and Sexual Activity  . Alcohol use: No    Alcohol/week: 0.0 oz  . Drug use: No    Comment: CBD OIL  . Sexual activity: Not Currently    Partners: Female  Lifestyle  . Physical activity:    Days per week: Not on file    Minutes per session: Not on file  . Stress:  Not on file  Relationships  . Social connections:    Talks on phone: Not on file    Gets together: Not on file    Attends religious service: Not on file    Active member of club or organization: Not on file    Attends meetings of clubs or organizations: Not on file    Relationship status: Not on file  . Intimate partner violence:    Fear of current or ex partner: Not on file    Emotionally abused: Not on file    Physically abused: Not on file    Forced sexual activity: Not on file  Other Topics Concern  . Not on file  Social History Narrative   Patient lives with his wife who is a great deal of help in his care    Allergies  Allergen Reactions  . Demerol [Meperidine] Other (See Comments)    UNSPECIFIED REACTION  Causes system to shutdown. ?   . Oxycodone Hcl Other (See Comments)    Pt states this medication 'wires him up' and makes pt hyper; pt does not want to take this ever again    Family History  Problem Relation Age of Onset  . Colon cancer Mother   . Ovarian cancer Mother   . Alcoholism Father   . CAD Father   . Alcohol abuse Father   . Ovarian cancer Sister   . Diabetes Neg Hx   . Stomach cancer Neg Hx     Prior to Admission medications   Medication Sig Start Date End Date Taking? Authorizing Provider  acyclovir (ZOVIRAX) 400 MG tablet Take 1 tablet (400 mg total) by mouth daily. 08/11/17   Brunetta Genera, MD  amoxicillin-clavulanate (AUGMENTIN) 875-125 MG tablet Take 1 tablet by mouth every 12 (twelve) hours for 2 days. 09/26/17 09/28/17  Eugenie Filler, MD  feeding supplement, ENSURE ENLIVE, (ENSURE ENLIVE) LIQD Take 237 mLs by mouth daily. 09/27/17   Eugenie Filler, MD  folic acid (FOLVITE) 1 MG tablet Take 1 tablet (1 mg total) by mouth daily. 09/27/17   Eugenie Filler, MD  furosemide (LASIX) 40 MG tablet Take 1 tablet (40 mg total) by mouth 2 (two) times daily. 09/26/17   Eugenie Filler, MD  miconazole nitrate (MICATIN) POWD Apply 1  application topically 2 (two) times daily for 7 days. Apply to rash in groin *SPD Supplied Product* 09/26/17 10/03/17  Eugenie Filler, MD  midodrine (PROAMATINE) 10 MG tablet Take 1 tablet (10 mg total) by mouth 3 (three) times daily with meals. 09/26/17   Eugenie Filler, MD  morphine (MSIR) 15 MG tablet Take 1 tablet (15 mg total) by mouth every 4 (four) hours as needed for moderate pain. 09/26/17   Eugenie Filler, MD  potassium chloride SA (K-DUR,KLOR-CON) 20 MEQ tablet Take 1 tablet (20 mEq total) by mouth 2 (two) times daily. 07/22/17   Brunetta Genera, MD  pregabalin (LYRICA) 50 MG capsule Take 1 capsule (50 mg total) by mouth 2 (two) times daily. 09/26/17   Eugenie Filler, MD  Probiotic Product (PROBIOTIC DAILY PO) Take 1 tablet by mouth daily.    [provider]  prochlorperazine (COMPAZINE) 10 MG tablet Take 1 tablet (10 mg total) every 6 (six) hours as needed by mouth (Nausea or vomiting). Patient not taking: Reported on 09/18/2017 03/24/17   Brunetta Genera, MD  senna-docusate (SENOKOT-S) 8.6-50 MG tablet Take 1 tablet by mouth 2 (two) times daily. 09/26/17   Eugenie Filler, MD  XARELTO 20 MG TABS tablet TAKE 1 TABLET BY MOUTH EVERY DAY WITH SUPPER 09/05/17   [name], MD    Physical Exam: Vitals:   09/27/17 1200 09/27/17 1215 09/27/17 1315 09/27/17 1500  BP: (!) 83/60 (!) 80/63 (!) 80/59 (!) 81/64  Pulse:    72  Resp: _0 Temp:      TempSrc:      SpO2:    99%     General:  Cachectic, frail, obviously chronically ill no acute distress Eyes:  PERRL, EOMI, normal lids, iris temporal wasting ENT:  grossly normal hearing, lips & tongue, is membranes of his mouth are pale somewhat dry Neck:  no LAD, masses or thyromegaly Cardiovascular:  Irregularly irregular, no m/r/g. No LE edema.  Respiratory:  Moderate increased work  of breathing. Respirations are quite shallow. Minimal air movement. BS are distant and course.  Abdomen:  soft, ntnd,  +BS no guarding or rebounding Skin:  no rash or induration seen on limited exam Musculoskeletal:  grossly normal tone BUE/BLE, good ROM, no bony abnormality Psychiatric:  grossly normal mood and affect, speech fluent and appropriate, AOx3 Neurologic:  CN 2-12 grossly intact, moves all extremities in coordinated fashion, sensation intact and oriented 3 speech clear  Labs on Admission: I have personally reviewed following labs and imaging studies  CBC: Recent Labs  Lab 09/21/17 1442 09/24/17 0945 09/25/17 0345 09/26/17 0455 09/27/17 0917  WBC 6.4 6.4 5.7 5.3 5.2  NEUTROABS 6.0 6.1 5.2 4.9 4.6  HGB 10.0* 8.9* 9.5* 9.3* 9.1*  HCT 30.8* 27.8* 29.0* 28.7* 28.4*  MCV 103.4* 102.2* 101.4* 102.9* 101.8*  PLT 304 243 255 228 299   Basic Metabolic Panel: Recent Labs  Lab 09/23/17 0500 09/24/17 0945 09/25/17 0345 09/26/17 0455 09/27/17 0917  NA 139 139 141 139 137  K 4.9 4.6 4.8 4.4 4.9  CL 99* 98* 98* 94* 94*  CO2 29 31 32 35* 32  GLUCOSE 119* 77 144* 104* 98  BUN 44* 43* 41* 39* 36*  CREATININE 1.19 1.14 1.20 0.94 1.01  CALCIUM 8.2* 8.3* 8.4* 8.1* 7.9*  MG  --  1.4* 2.1  --   --    GFR: Estimated Creatinine Clearance: 58.4 mL/min (by C-G formula based on SCr of 1.01 mg/dL). Liver Function Tests: Recent Labs  Lab 09/27/17 0917  AST 18  ALT 11*  ALKPHOS 225*  BILITOT 0.5  PROT 4.0*  ALBUMIN 1.6*   No results for input(s): LIPASE, AMYLASE in the last 168 hours. No results for input(s): AMMONIA in the last 168 hours. Coagulation Profile: No results for input(s): INR, PROTIME in the last 168 hours. Cardiac Enzymes: Recent Labs  Lab 09/27/17 0917  TROPONINI 0.09*   BNP (last 3 results) No results for input(s): PROBNP in the last 8760 hours. HbA1C: No results for input(s): HGBA1C in the last 72 hours. CBG: No results for input(s): GLUCAP in the last 168 hours. Lipid Profile: No results for input(s): CHOL, HDL, LDLCALC, TRIG, CHOLHDL, LDLDIRECT in the last 72  hours. Thyroid Function Tests: No results for input(s): TSH, T4TOTAL, FREET4, T3FREE, THYROIDAB in the last 72 hours. Anemia Panel: No results for input(s): VITAMINB12, FOLATE, FERRITIN, TIBC, IRON, RETICCTPCT in the last 72 hours. Urine analysis:    Component Value Date/Time   COLORURINE YELLOW 09/23/2017 0930   APPEARANCEUR HAZY (A) 09/23/2017 0930   LABSPEC 1.009 09/23/2017 0930   PHURINE 5.0 09/23/2017 0930   GLUCOSEU NEGATIVE 09/23/2017 0930   HGBUR LARGE (A) 09/23/2017 0930   BILIRUBINUR NEGATIVE 09/23/2017 0930   KETONESUR NEGATIVE 09/23/2017 0930   PROTEINUR 30 (A) 09/23/2017 0930   NITRITE NEGATIVE 09/23/2017 0930   LEUKOCYTESUR NEGATIVE 09/23/2017 0930    Creatinine Clearance: Estimated Creatinine Clearance: 58.4 mL/min (by C-G formula based on SCr of 1.01 mg/dL).  Sepsis Labs: _0 (procalcitonin:4,lacticidven:4) ) Recent Results (from the past 240 hour(s))  MRSA PCR Screening     Status: None   Collection Time: 09/18/17 10:25 PM  Result Value Ref Range Status   MRSA by PCR NEGATIVE NEGATIVE Final    Comment:        The GeneXpert MRSA Assay (FDA approved for NASAL specimens only), is one component of a comprehensive MRSA colonization surveillance program. It is not intended to diagnose MRSA infection nor to guide or monitor  treatment for MRSA infections. Performed at [facility], [place] [address], [place], [place] [address]   Culture, expectorated sputum-assessment     Status: None   Collection Time: 09/21/17  2:42 PM  Result Value Ref Range Status   Specimen Description SPUTUM  Final   Special Requests   Final    NONE Performed at [facility], [place] [address] [place], [place] [address]    Sputum evaluation THIS SPECIMEN IS ACCEPTABLE FOR SPUTUM CULTURE  Final   Report Status 09/22/2017 FINAL  Final  Culture, respiratory (NON-Expectorated)     Status: None   Collection Time: 09/21/17  2:42 PM  Result Value  Ref Range Status   Specimen Description SPUTUM  Final   Special Requests NONE Reflexed from [id number]  Final   Gram Stain   Final    FEW WBC PRESENT, PREDOMINANTLY PMN ABUNDANT GRAM POSITIVE RODS FEW GRAM POSITIVE COCCI IN CLUSTERS FEW GRAM NEGATIVE RODS    Culture   Final    Consistent with normal respiratory flora. Performed at [facility], [place] [address], [place], [place] [address]    Report Status 09/24/2017 FINAL  Final  Body fluid culture     Status: None   Collection Time: 09/22/17  2:41 PM  Result Value Ref Range Status   Specimen Description   Final    PLEURAL LEFT Performed at [place] [address] [place], [place] [address]    Special Requests   Final    NONE Performed at [facility], [place] [address] [place], [place] [address]    Gram Stain   Final    RARE WBC PRESENT, PREDOMINANTLY PMN NO ORGANISMS SEEN    Culture   Final    NO GROWTH 3 DAYS Performed at [facility] [address] [place], [place] [address]    Report Status 09/26/2017 FINAL  Final     Radiological Exams on Admission: Ct Angio Chest Pe W Or Wo Contrast  Result Date: 09/27/2017 CLINICAL DATA:  72 year old male with a history of congestive heart failure EXAM: CT ANGIOGRAPHY CHEST WITH CONTRAST TECHNIQUE: Multidetector CT imaging of the chest was performed using the standard protocol during bolus administration of intravenous contrast. Multiplanar CT image reconstructions and MIPs were obtained to evaluate the vascular anatomy. CONTRAST:  127m ISOVUE-370 IOPAMIDOL (ISOVUE-370) INJECTION 76% COMPARISON:  PET-CT 04/04/2017, abdominal CT 06/12/2017 FINDINGS: Cardiovascular: Heart: Redemonstration of cardiomegaly. Improved pericardial effusion compared to the prior. Calcifications of left anterior descending, right, circumflex coronary arteries. Aorta: Calcifications of the aortic arch and the branch vessels. Calcifications of descending thoracic  aorta. Pulmonary arteries: No central, lobar, segmental, or proximal subsegmental filling defects. Mediastinum/Nodes: Mediastinal adenopathy, with paratracheal lymphadenopathy. Largest nodal mass in the left paratracheal nodal station. No endotracheal debris. Debris within the right and left lower lobe bronchi. Unremarkable thyroid. Lungs/Pleura: Centrilobular and paraseptal emphysema. No significant interlobular septal thickening. Multifocal airspace opacity, in a predominantly dependent distribution of the bilateral lungs, involving bilateral upper lobes, middle lobe, bilateral lower lobes. Endobronchial debris in the bilateral lower lobes. Bilateral pleural effusions. There is evidence of complex right-sided pleural effusion with partial loculation along the medial aspect of the right lower lobe. Partially calcified right hilar and right mediastinal lymph nodes. Upper Abdomen: Poorly evaluated upper abdomen. Musculoskeletal: Cachectic appearance of the chest wall/abdomen wall with edema. No acute displaced fracture identified. Scoliotic curvature of the thoracic spine with degenerative changes. No bony canal narrowing. Rib  deformities of prior partially healed rib fractures on the right. Right IJ port catheter. Review of the MIP images confirms the above findings. IMPRESSION: CT negative for pulmonary emboli. Worsening dependent airspace disease and pleural effusions throughout all lobes, favored to represent sequela of aspiration and aspiration pneumonia/pneumonitis. There is evidence of partial loculation of right-sided pleural fluid, potentially representing developing empyema versus complex pleural fluid. Reactive mediastinal lymphadenopathy. Partially calcified lymph nodes of the mediastinum, compatible with prior granulomatous disease. Emphysema (ICD10-J43.9). Improved pericardial effusion compared to the prior CT. Cardiomegaly. Coronary artery disease. Aortic Atherosclerosis (ICD10-I70.0). Electronically  Signed   By: Corrie Mckusick D.O.   On: 09/27/2017 13:09   Dg Chest Portable 1 View  Result Date: 09/27/2017 CLINICAL DATA:  Shortness of breath with hypoxia. History of lymphoma. EXAM: PORTABLE CHEST 1 VIEW COMPARISON:  09/23/2017 and 05/09/2017 FINDINGS: Right IJ Port-A-Cath with tip over the right atrium unchanged. Stable opacification over the right midlung as well as bilateral perihilar regions and lung bases left worse than right. Mild stable cardiomegaly. Remainder of the exam is unchanged. IMPRESSION: Persistent airspace opacification over the right midlung with bilateral perihilar and bibasilar opacification left worse than right. Findings likely due to infection with bilateral effusions/basilar atelectasis. Stable cardiomegaly. Right IJ Port-A-Cath with tip over the right atrium unchanged. Electronically Signed   By: Marin Olp M.D.   On: 09/27/2017 09:21    EKG: Independently reviewed. Atrial fibrillation Ventricular premature complex IVCD, consider atypical RBBB Nonspecific T abnormalities, lateral leads  Assessment/Plan Principal Problem:   Acute-on-chronic respiratory failure (HCC) Active Problems:   Bilateral pleural effusion   Acute on chronic combined systolic and diastolic CHF (congestive heart failure) (HCC)   Aspiration pneumonia (HCC)   Hypotension   COPD mixed type (HCC)   Loculated pleural effusion   Persistent atrial fibrillation (HCC)   Hypoalbuminemia   Elevated brain natriuretic peptide (BNP) level   Nephrotic syndrome   Anemia   Protein-calorie malnutrition, severe (HCC)   Empyema (HCC)   Pulmonary HTN (HCC)   Elevated troponin   #1. Acute on chronic respiratory failure etiology likely multifactorial. 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 CT angio chest reveals worsening bilateral pleural effusions favored to represent sequela of aspiration and aspiration pneumonia/pneumonitis. CT  report also concerning for partial loculation of right-sided pleural fluid since representing developing empyema versus complex pleural fluid.oxygen saturation level reportedly 82% on 5 L at home prior to admission. He received oxygen supplementation via nonrebreather and 50 mils of normal salineand albumin. The time of admission oxygen saturation level 92% on 5 L nasal cannula.eNP greater than 1400. He is afebrile, no leukocytosis, lactic acid WNL. He does have elevated troponin which is his baseline. Anabiotic initiated in the emergency department for possible aspiration pneumonia -Admit to step down -continue oxygen supplementation -Monitor oxygen saturation level -IV Lasix as blood pressure allows and per cardiology recommendation -scheduled nebs -continue IV antibiotics -of note patient underwent bedside swallow eval that resulted functional. Query silent aspiration. May consider barium swallow\  #2. Aspiration pneumonia/pneumonitis. CT NGO of chest as noted above. Patient is afebrile no leukocytosis he was hypotensive but that is his baseline. No tachycardia no tachypne, lactic acid within the limits of normal. A bedside swallow eval last hospitalization resulting as functional. Antibiotics initiated in the emergency department per protocol -Track blood cultures -Obtain sputum cultures as able -Antibiotics as noted above -Consider barium swallow  #3. Acute on chronic combined diastolic systolic  heart failure related to low oncotic pressure. Albumin 1.6. Received albumin in ED. Patient is discharged yesterday after 4 day hospitalization of same. During that hospitalization he was evaluated by cardiology. He was able to be diuresed with IV Lasix. Chart review indicates he diuresed 7 L. Went home on 40 mg Lasix by mouth. BNP 1400. Chest x-ray as noted above. Discussed briefly with cardiology indicated during patient's last hospitalization he discussed with family possibility of palliative care as  limits of treatment have been reached. Medications include Lasix and Imdur.  -transfuse 2 units packed RBC -monitor intake and out -Obtain daily weights -Continue Imdur -Defer diuresis plan to cardiology -Appreciate cardiology assistance  #4. Bilateral pleural effusion.CT of the chest reveals worsening as noted above. Also concern for empyema as well as #2. Patient underwent thoracentesis on May 13 during last hospitalization. These also improved with diuresis during that  hospitalization. -Diuresis per cardiology -If no improvement consider repeat thoracentesis and/or pulmonary consult  #5. Anemia related to iron and folate deficiency in setting of chronic disease/chemotherapy.. Hg 9.1. Hx of same. No obvious signs symptoms of bleeding. Appears stable at baseline. -see #3 -continue folic acid -Monitor -consider transfusion if Hg drops below 8  #6. Elevated troponin. Patient denies chest pain. EKG as noted above. X-rays noted above. Troponin level 0.09. Chart review indicates this is chronic. -monitor -await any recommendations from cards  7. Hypotension. Patient with a history of same. Home medications include midodrine. Systolic blood pressure range while in the emergency department 80-90. He did have a couple readings systolic blood pressure in the 70s. But sustained range 80-90. -continue mididrine -monitor  #8. Copd. On 4 L nasal cannula oxygen at home. See #1 and #2. -Scheduled meds -Monitor oxygen saturation level  #9. History of nephrotic syndrome. Chart review indicates presumably from AL amyloidosis.  -cards consult.  Of note, wife agreeable to limited code as explained. I also requested palliative care consult for goals of care. Chart review indicates this discussion has happened in the past. I believe this will need to be revisited several times.    DVT prophylaxis: xarelto  Code Status: limited  Family Communication: wife and daughter  Disposition Plan: snf    Consults called: [facility] cards  Admission status: inpatient    Radene Gunning MD [facility]  If 7PM-7AM, please contact night-coverage [email or web address] Password [facility]  09/27/2017, 4:05 PM

## 2017-09-28 ENCOUNTER — Other Ambulatory Visit: Payer: Self-pay

## 2017-09-28 ENCOUNTER — Encounter (HOSPITAL_COMMUNITY): Payer: Self-pay

## 2017-09-28 LAB — TYPE AND SCREEN
ABO/RH(D): A POS
ANTIBODY SCREEN: NEGATIVE
UNIT DIVISION: 0
UNIT DIVISION: 0

## 2017-09-28 LAB — BPAM RBC
BLOOD PRODUCT EXPIRATION DATE: 201906052359
Blood Product Expiration Date: 201906052359
ISSUE DATE / TIME: 201905181838
ISSUE DATE / TIME: 201905182309
UNIT TYPE AND RH: 6200
Unit Type and Rh: 6200

## 2017-09-28 LAB — MRSA PCR SCREENING: MRSA by PCR: NEGATIVE

## 2017-09-28 MED ORDER — PREGABALIN 50 MG PO CAPS
50.0000 mg | ORAL_CAPSULE | Freq: Two times a day (BID) | ORAL | Status: DC
  Administered 2017-09-28 – 2017-10-09 (×22): 50 mg via ORAL
  Filled 2017-09-28 (×22): qty 1

## 2017-09-28 MED ORDER — ALBUTEROL SULFATE (2.5 MG/3ML) 0.083% IN NEBU: 2.5000 mg | INHALATION_SOLUTION | RESPIRATORY_TRACT | Status: DC | PRN

## 2017-09-28 MED ORDER — FOLIC ACID 1 MG PO TABS
1.0000 mg | ORAL_TABLET | Freq: Every day | ORAL | Status: DC
  Administered 2017-09-28 – 2017-10-09 (×12): 1 mg via ORAL
  Filled 2017-09-28 (×12): qty 1

## 2017-09-28 MED ORDER — TROLAMINE SALICYLATE 10 % EX CREA
TOPICAL_CREAM | Freq: Four times a day (QID) | CUTANEOUS | Status: DC | PRN
  Filled 2017-09-28: qty 85

## 2017-09-28 MED ORDER — MORPHINE SULFATE 15 MG PO TABS
15.0000 mg | ORAL_TABLET | ORAL | Status: DC | PRN
  Administered 2017-09-29 – 2017-10-07 (×16): 15 mg via ORAL
  Filled 2017-09-28 (×20): qty 1

## 2017-09-28 MED ORDER — MUSCLE RUB 10-15 % EX CREA
TOPICAL_CREAM | Freq: Four times a day (QID) | CUTANEOUS | Status: DC | PRN
  Administered 2017-09-29 – 2017-10-01 (×4): via TOPICAL
  Filled 2017-09-28: qty 85

## 2017-09-28 MED ORDER — SENNOSIDES-DOCUSATE SODIUM 8.6-50 MG PO TABS
1.0000 | ORAL_TABLET | Freq: Two times a day (BID) | ORAL | Status: DC
  Administered 2017-09-28 – 2017-10-09 (×21): 1 via ORAL
  Filled 2017-09-28 (×21): qty 1

## 2017-09-28 NOTE — Progress Notes (Signed)
MD paged, Rectal T 94.9, new order received

## 2017-09-28 NOTE — Progress Notes (Signed)
Palliative meeting with patient and wife tomorrow (Monday) morning at 10:00.  Florentina Jenny, PA-C Palliative Medicine Pager: [phone]  No charge note

## 2017-09-28 NOTE — Progress Notes (Signed)
[facility] TEAM 1 - Stepdown/ICU TEAM  [name]  BJS:[id number] DOB: Sep 14, 1945 DOA: 09/27/2017 PCP: Biagio Borg, MD    Brief Narrative:  72 y.o. 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   Patient was discharged from [facility] the evening prior to his return to the ED. During that admission he diuresed 7 L. He was sent home on 4L [place], increased from his prior 3L.    Significant Events: 5/17 d/c from [facility] 5/18 admit to [facility]   Subjective: Resting comfortably at the time of my visit.  I chose not to wake him.  No evidence of resp distress or uncontrolled pain.    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  Chronic pain Comfortable at time of exam today   Severe protein calorie malnutrition  COPD No acute exacerbation at this time   Anemia with iron and folate deficiency  Pressure injury of the sacrum  Hematuria and urinary retention Foley cath to remain in place   Chronic B pleural effusion Status post  thoracentesis 09/22/2017 - CT chest this admit raises concern for possible loculation on R   DVT prophylaxis: Xarelto  Code Status: NO CPR - NO INTUBATION  Family Communication:   Disposition Plan: SDU  Consultants:  Cardiology   Antimicrobials:  Cefepime 5/18 > Vanc 5/18 >  Objective: Blood pressure (!) 82/58, pulse 64, temperature (!) 94.9 F (34.9 C), temperature source Rectal, resp. rate 13, height _0  (1.803 m), weight 63 kg (138 lb 12.8 oz), SpO2 100 %.  Intake/Output Summary (Last 24 hours) at 09/28/2017 1515 Last data filed at 09/28/2017 0730 Gross per 24 hour  Intake 830 ml  Output 975 ml  Net -145 ml   Filed Weights   09/28/17 0443  Weight: 63 kg (138 lb 12.8 oz)    Examination: General: No acute respiratory distress - resting comfortably  Lungs: blunting of breath sounds th/o - no wheezing  Cardiovascular: Regular rate and rhythm without murmur   CBC: Recent Labs  Lab 09/25/17 0345 09/26/17 0455 09/27/17 0917  WBC 5.7 5.3 5.2  NEUTROABS 5.2 4.9 4.6  HGB 9.5* 9.3* 9.1*  HCT 29.0* 28.7* 28.4*  MCV 101.4* 102.9* 101.8*  PLT 255 228 737   Basic Metabolic Panel: Recent Labs  Lab 09/24/17 0945 09/25/17 0345 09/26/17 0455 09/27/17 0917  NA 139 141 139 137  K 4.6 4.8 4.4 4.9  CL 98* 98* 94* 94*  CO2 31 32 35* 32  GLUCOSE 77 144* 104* 98  BUN 43* 41* 39* 36*  CREATININE  1.14 1.20 0.94 1.01  CALCIUM 8.3* 8.4* 8.1* 7.9*  MG 1.4* 2.1  --   --    GFR: Estimated Creatinine Clearance: 57.2 mL/min (by C-G formula based on SCr of 1.01 mg/dL).  Liver Function Tests: Recent Labs  Lab 09/27/17 0917  AST 18  ALT 11*  ALKPHOS 225*  BILITOT 0.5  PROT 4.0*  ALBUMIN 1.6*    Cardiac Enzymes: Recent Labs  Lab 09/27/17 0917  TROPONINI 0.09*    Recent Results (from the past 240 hour(s))  MRSA PCR Screening     Status: None   Collection Time: 09/18/17 10:25 PM  Result Value Ref Range Status   MRSA by PCR NEGATIVE NEGATIVE Final    Comment:         The GeneXpert MRSA Assay (FDA approved for NASAL specimens only), is one component of a comprehensive MRSA colonization surveillance program. It is not intended to diagnose MRSA infection nor to guide or monitor treatment for MRSA infections. Performed at [facility], [place] [address] [place], [place] [address]   Culture, expectorated sputum-assessment     Status: None   Collection Time: 09/21/17  2:42 PM  Result Value Ref Range Status   Specimen Description SPUTUM  Final   Special Requests   Final    NONE Performed at [facility], [place] [address] [place], [place] [address]    Sputum evaluation THIS SPECIMEN IS ACCEPTABLE FOR SPUTUM CULTURE  Final   Report Status 09/22/2017 FINAL  Final  Culture, respiratory (NON-Expectorated)     Status: None   Collection Time: 09/21/17  2:42 PM  Result Value Ref Range Status   Specimen Description SPUTUM  Final   Special Requests NONE Reflexed from [id number]  Final   Gram Stain   Final    FEW WBC PRESENT, PREDOMINANTLY PMN ABUNDANT GRAM POSITIVE RODS FEW GRAM POSITIVE COCCI IN CLUSTERS FEW GRAM NEGATIVE RODS    Culture   Final    Consistent with normal respiratory flora. Performed at [facility], [place] [address] [place], [place] [address]    Report Status 09/24/2017 FINAL  Final  Body fluid culture     Status: None   Collection Time: 09/22/17  2:41 PM  Result Value Ref Range Status   Specimen Description   Final    PLEURAL LEFT Performed at [place] [address] [place], [place] [address]    Special Requests   Final    NONE Performed at [facility], [place] [address] [place], [place] [address]    Gram Stain   Final    RARE WBC PRESENT, PREDOMINANTLY PMN NO ORGANISMS SEEN    Culture   Final    NO GROWTH 3 DAYS Performed at [place] [address] [place], [place] [address]    Report Status 09/26/2017 FINAL  Final  Blood  culture (routine x 2)     Status: None (Preliminary result)   Collection Time: 09/27/17  9:52 AM  Result Value Ref Range Status   Specimen Description BLOOD RIGHT CHEST  Final   Special Requests   Final    BOTTLES DRAWN AEROBIC AND ANAEROBIC Blood Culture results may not be optimal due to an excessive volume of blood received in culture bottles   Culture   Final    NO GROWTH 1 DAY Performed at [facility], [place] [address] [place], [place] [address]    Report Status PENDING  Incomplete  Blood culture (routine x 2)     Status: None (Preliminary result)   Collection Time: 09/27/17 10:30 AM  Result Value Ref Range Status   Specimen Description BLOOD RIGHT HAND  Final   Special Requests   Final    BOTTLES DRAWN AEROBIC AND ANAEROBIC Blood Culture results may not be optimal due to an inadequate volume of blood received in culture bottles   Culture   Final    NO GROWTH 1 DAY Performed at [facility], [place] [address], [place], [place] [address]    Report Status PENDING  Incomplete  MRSA PCR Screening     Status: None   Collection Time: 09/28/17  9:04 AM  Result Value Ref Range Status   MRSA by PCR NEGATIVE NEGATIVE Final    Comment:        The GeneXpert MRSA Assay (FDA approved for NASAL specimens only), is one component of a comprehensive MRSA colonization surveillance program. It is not intended to diagnose MRSA infection nor to guide or monitor treatment for MRSA infections. Performed at [facility], [place] [address] [place], [place] [address]      Scheduled Meds: . acyclovir  400 mg Oral Daily  . albuterol  2.5 mg Nebulization TID  . miconazole nitrate  1 application Topical BID  . midodrine  10 mg Oral TID WC  . potassium chloride SA  20 mEq Oral BID  . rivaroxaban  20 mg Oral Daily  . sodium chloride flush  3 mL Intravenous Q12H     LOS: 1 day   Cherene Altes, MD [facility] Office  [phone] Pager - Text Page per Amion as per  below:  On-Call/Text Page:      [email or web address]      password [email or web address]  If 7PM-7AM, please contact night-coverage [email or web address] Password [email or web address] 09/28/2017, 3:15 PM

## 2017-09-28 NOTE — Discharge Instructions (Signed)

## 2017-09-29 ENCOUNTER — Ambulatory Visit: Payer: Medicare Other | Admitting: Internal Medicine

## 2017-09-29 ENCOUNTER — Other Ambulatory Visit: Payer: Self-pay | Admitting: Hematology

## 2017-09-29 DIAGNOSIS — E8809 Other disorders of plasma-protein metabolism, not elsewhere classified: Secondary | ICD-10-CM

## 2017-09-29 DIAGNOSIS — J690 Pneumonitis due to inhalation of food and vomit: Secondary | ICD-10-CM

## 2017-09-29 DIAGNOSIS — J449 Chronic obstructive pulmonary disease, unspecified: Secondary | ICD-10-CM

## 2017-09-29 DIAGNOSIS — R5383 Other fatigue: Secondary | ICD-10-CM

## 2017-09-29 DIAGNOSIS — E8581 Light chain (AL) amyloidosis: Secondary | ICD-10-CM

## 2017-09-29 DIAGNOSIS — Z515 Encounter for palliative care: Secondary | ICD-10-CM

## 2017-09-29 LAB — COMPREHENSIVE METABOLIC PANEL
ALT: 9 U/L — ABNORMAL LOW (ref 17–63)
AST: 19 U/L (ref 15–41)
Albumin: 1.7 g/dL — ABNORMAL LOW (ref 3.5–5.0)
Alkaline Phosphatase: 240 U/L — ABNORMAL HIGH (ref 38–126)
Anion gap: 10 (ref 5–15)
BILIRUBIN TOTAL: 0.9 mg/dL (ref 0.3–1.2)
BUN: 29 mg/dL — ABNORMAL HIGH (ref 6–20)
CALCIUM: 7.9 mg/dL — AB (ref 8.9–10.3)
CHLORIDE: 97 mmol/L — AB (ref 101–111)
CO2: 30 mmol/L (ref 22–32)
CREATININE: 0.94 mg/dL (ref 0.61–1.24)
GFR calc non Af Amer: >60 mL/min (ref >60)
Glucose, Bld: 85 mg/dL (ref 65–99)
Potassium: 5.4 mmol/L — ABNORMAL HIGH (ref 3.5–5.1)
Sodium: 137 mmol/L (ref 135–145)
Total Protein: 3.9 g/dL — ABNORMAL LOW (ref 6.5–8.1)

## 2017-09-29 LAB — TSH: TSH: 2.996 u[IU]/mL (ref 0.350–4.500)

## 2017-09-29 LAB — CBC
HCT: 37.3 % — ABNORMAL LOW (ref 39.0–52.0)
Hemoglobin: 12.2 g/dL — ABNORMAL LOW (ref 13.0–17.0)
MCH: 31.4 pg (ref 26.0–34.0)
MCHC: 32.7 g/dL (ref 30.0–36.0)
MCV: 95.9 fL (ref 78.0–100.0)
PLATELETS: 145 10*3/uL — AB (ref 150–400)
RBC: 3.89 MIL/uL — ABNORMAL LOW (ref 4.22–5.81)
RDW: 18.2 % — AB (ref 11.5–15.5)
WBC: 5.1 10*3/uL (ref 4.0–10.5)

## 2017-09-29 LAB — CORTISOL: CORTISOL PLASMA: 11.5 ug/dL

## 2017-09-29 MED ORDER — SALINE SPRAY 0.65 % NA SOLN
1.0000 | NASAL | Status: DC | PRN
  Filled 2017-09-29: qty 44

## 2017-09-29 NOTE — Progress Notes (Addendum)
TEAM 1 - Stepdown/ICU TEAM  [name]  ZLD:[id number] DOB: [birth year] DOA: 09/27/2017 PCP: Biagio Borg, MD    Brief Narrative:  72 y.o. 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   Patient was discharged from [facility] the evening prior to his return to the ED. During that admission he diuresed 7 L. He was sent home on 4L [place], increased from his prior 3L.    Significant Events: 5/17 d/c from [facility] 5/18 admit to [facility]   Subjective: Reports that he feels somewhat less SOB.  C/o feeling very weak in general.  Denies cp, n/v, or abdom pain.    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 - there is no short term fix for this situation - ongoing volume overload is expected - attempts to diurese aggressively will likely lead to poor organ perfusion and worsening of chronic hypotension   Hypothermia No clear acute infection - tx w/ bair hugger and follow clinically - Temp appears to be trending upward   Chronic diastolic heart failure Normal LV fxn on TTE - no new suggestions per Cards  Filed Weights   09/28/17 0443 09/29/17 0500  Weight: 63 kg (138 lb 12.8 oz) 63 kg (138 lb 14.2 oz)    Chronic hypotension Cont usual dose of midodrine - BP stable presently   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 - see discussion above   Chronic pain Comfortable at time of exam today   Severe protein calorie malnutrition  COPD No acute exacerbation at this time   Anemia with iron  and folate deficiency Transfused 2U PRBC this admit   Pressure injury of the sacrum  Hematuria and urinary retention Foley cath to remain in place   Chronic B pleural effusion Status post thoracentesis 09/22/2017 - CT chest this admit raises concern for possible loculation on R - not clinically signif at this time    DVT prophylaxis: Xarelto  Code Status: DNR - NO CODE  BLUE  Family Communication:   Disposition Plan: SDU  Consultants:  Cardiology   Antimicrobials:  Cefepime 5/18 > 5/19 Vanc 5/18 > 5/19  Objective: Blood pressure 98/69, pulse 76, temperature 98.1 F (36.7 C), temperature source Oral, resp. rate 18, height _0  (1.803 m), weight 63 kg (138 lb 14.2 oz), SpO2 97 %.  Intake/Output Summary (Last 24 hours) at 09/29/2017 1615 Last data filed at 09/29/2017 1500 Gross per 24 hour  Intake 600 ml  Output 450 ml  Net 150 ml   Filed Weights   09/28/17 0443 09/29/17 0500  Weight: 63 kg (138 lb 12.8 oz) 63 kg (138 lb 14.2 oz)    Examination: General: No acute respiratory distress - more alert and interactive   Lungs: blunting of breath sounds in B bases - no wheezing  Cardiovascular: Regular rate w/o M or rub   CBC: Recent Labs  Lab 09/25/17 0345 09/26/17 0455 09/27/17 0917 09/29/17 0701  WBC 5.7 5.3 5.2 5.1  NEUTROABS 5.2 4.9 4.6  --   HGB 9.5*  9.3* 9.1* 12.2*  HCT 29.0* 28.7* 28.4* 37.3*  MCV 101.4* 102.9* 101.8* 95.9  PLT 255 228 186 071*   Basic Metabolic Panel: Recent Labs  Lab 09/24/17 0945 09/25/17 0345 09/26/17 0455 09/27/17 0917 09/29/17 0701  NA 139 141 139 137 137  K 4.6 4.8 4.4 4.9 5.4*  CL 98* 98* 94* 94* 97*  CO2 31 32 35* 32 30  GLUCOSE 77 144* 104* 98 85  BUN 43* 41* 39* 36* 29*  CREATININE 1.14 1.20 0.94 1.01 0.94  CALCIUM 8.3* 8.4* 8.1* 7.9* 7.9*  MG 1.4* 2.1  --   --   --    GFR: Estimated Creatinine Clearance: 61.4 mL/min (by C-G formula based on SCr of 0.94 mg/dL).  Liver Function Tests: Recent Labs  Lab  09/27/17 0917 09/29/17 0701  AST 18 19  ALT 11* 9*  ALKPHOS 225* 240*  BILITOT 0.5 0.9  PROT 4.0* 3.9*  ALBUMIN 1.6* 1.7*    Cardiac Enzymes: Recent Labs  Lab 09/27/17 0917  TROPONINI 0.09*    Recent Results (from the past 240 hour(s))  Culture, expectorated sputum-assessment     Status: None   Collection Time: 09/21/17  2:42 PM  Result Value Ref Range Status   Specimen Description SPUTUM  Final   Special Requests   Final    NONE Performed at [place] [address], [place], [place] [address]    Sputum evaluation THIS SPECIMEN IS ACCEPTABLE FOR SPUTUM CULTURE  Final   Report Status 09/22/2017 FINAL  Final  Culture, respiratory (NON-Expectorated)     Status: None   Collection Time: 09/21/17  2:42 PM  Result Value Ref Range Status   Specimen Description SPUTUM  Final   Special Requests NONE Reflexed from [id number]  Final   Gram Stain   Final    FEW WBC PRESENT, PREDOMINANTLY PMN ABUNDANT GRAM POSITIVE RODS FEW GRAM POSITIVE COCCI IN CLUSTERS FEW GRAM NEGATIVE RODS    Culture   Final    Consistent with normal respiratory flora. Performed at [facility], [place] [address], [place], [place] [address]    Report Status 09/24/2017 FINAL  Final  Body fluid culture     Status: None   Collection Time: 09/22/17  2:41 PM  Result Value Ref Range Status   Specimen Description   Final    PLEURAL LEFT Performed at [place] [address], [place], [place] [address]    Special Requests   Final    NONE Performed at [facility], [place] [address], [place], [place] [address]    Gram Stain   Final    RARE WBC PRESENT, PREDOMINANTLY PMN NO ORGANISMS SEEN    Culture   Final    NO GROWTH 3 DAYS Performed at [place] [address] [place], [place] [address]    Report Status 09/26/2017 FINAL  Final  Blood culture (routine x 2)     Status: None (Preliminary result)   Collection Time: 09/27/17  9:52  AM  Result Value Ref Range Status   Specimen Description BLOOD RIGHT CHEST  Final   Special Requests   Final    BOTTLES DRAWN AEROBIC AND ANAEROBIC Blood Culture results may not be optimal due to an excessive volume of blood received in culture bottles   Culture   Final    NO GROWTH 2 DAYS Performed at [facility], [place] [address], [place], [place] [address]  Report Status PENDING  Incomplete  Blood culture (routine x 2)     Status: None (Preliminary result)   Collection Time: 09/27/17 10:30 AM  Result Value Ref Range Status   Specimen Description BLOOD RIGHT HAND  Final   Special Requests   Final    BOTTLES DRAWN AEROBIC AND ANAEROBIC Blood Culture results may not be optimal due to an inadequate volume of blood received in culture bottles   Culture   Final    NO GROWTH 2 DAYS Performed at [facility], [place] [address] [place], [place] [address]    Report Status PENDING  Incomplete  MRSA PCR Screening     Status: None   Collection Time: 09/28/17  9:04 AM  Result Value Ref Range Status   MRSA by PCR NEGATIVE NEGATIVE Final    Comment:        The GeneXpert MRSA Assay (FDA approved for NASAL specimens only), is one component of a comprehensive MRSA colonization surveillance program. It is not intended to diagnose MRSA infection nor to guide or monitor treatment for MRSA infections. Performed at [facility], [place] [address] [place], [place] [address]      Scheduled Meds: . acyclovir  400 mg Oral Daily  . albuterol  2.5 mg Nebulization TID  . folic acid  1 mg Oral Daily  . miconazole nitrate  1 application Topical BID  . midodrine  10 mg Oral TID WC  . pregabalin  50 mg Oral BID  . rivaroxaban  20 mg Oral Daily  . senna-docusate  1 tablet Oral BID     LOS: 2 days   Cherene Altes, MD [facility] Office  [phone] Pager - Text Page per Amion as per below:  On-Call/Text Page:      [email or web address]      password [email or web address]  If 7PM-7AM, please  contact night-coverage [email or web address] Password [email or web address] 09/29/2017, 4:15 PM

## 2017-09-29 NOTE — Consult Note (Signed)
   [facility] CM Inpatient Consult   09/29/2017  MARKO SKALSKI 01/16/1946 [id number]    Patient screened for [place] [facility] services due to increased unplanned readmission risk score of 44% (extreme).  Went to bedside to speak with patient/wife. However, Mr. Strausbaugh was sleeping soundly upon bedside visit and wife was not present.  Reviewed palliative medicine consult notes.   Will continue to follow and engage for [facility] at later time to speak with wife as well.  Will make inpatient RNCM aware of bedside visit attempt.   Marthenia Rolling, MSN-Ed, RN,BSN [facility] Liaison [phone]

## 2017-09-29 NOTE — Consult Note (Signed)
                                                                                 Consultation Note Date: 09/29/2017   Patient Name: [name]  DOB: 06/23/1945  MRN: [id number]  Age / Sex: 72 y.o., male  PCP: [name], James W, MD Referring Physician: McClung, Christopher T, MD  Reason for Consultation: Establishing goals of care and Psychosocial/spiritual support  HPI/Patient Profile: 72 y.o. male  with past medical history of AL amyloidosis with Waldenstrom's macroglobinemia, prostate cancer, COPD on home oxygen (3L), diastolic heart failure, and degenerative disc disease with chronic back pain who was admitted on 09/27/2017 with lethargy, hypoxic respiratory failure, hypotensive with hypothermia.   [name] remained lethargic through out his stay at [facility] and thru readmisson.  His mental status returned to normal after receiving two units of blood this admission.    Of note, the patient had been discharged less than 24 hours earlier after an 8 day stay at [facility] for similar symptoms.  Clinical Assessment and Goals of Care:  I have reviewed medical records including EPIC notes, labs and imaging, received report from the care team assessed the patient and then met at the bedside along with his wife, [name] to discuss diagnosis prognosis, GOC, EOL wishes, disposition and options.  The patient was receiving a bath so [name] and I stepped into a conference room.  I introduced Palliative Medicine as specialized medical care for people living with serious illness. It focuses on providing relief from the symptoms and stress of a serious illness. The goal is to improve quality of life for both the patient and the family.  We discussed a brief life review of the patient. He is from [place].  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  He played for corporate events, furniture markets, SNFs, retirement communities, memory  care.  His favorite music is classic rock ([name], [name], [name]).  He has a step son and daughter that he raised.  He and his wife are Christian.  The couple receive some support from their daughter who lives locally and they have regular visits from home health RN, PT, OT, Aide.    As far as functional and nutritional status prior to May the patient was able to stand and take a few steps.  He normally sleeps in his lift recliner at home.  Per his wife he had a good appetite.   His albumin has remained in the 1.6 - 2.0 range for at least the past 7 months due to nephrotic syndrome.  [name] states a normal blood pressure for [name] is systolic in the 90s.  We discussed his current illness and what it means in the larger context of his on-going co-morbidities.  Natural disease trajectory and expectations were discussed.  [name] (patient's wife) believes that there is hope for cure from the lymphoma via chemo.  She tells me that once the lymphoma is cared for - it will take a long time but they will be able to improve his nutritional situation.  Her belief that   all of this is reversible has pushed them to keep physical therapy involved on a regular basis.  We discussed the patient's overall debility/frailty - and whether or not he is past the point of being able to recover even if the Lymphoma is treatable.   [name] is uncertain and felt as though she wanted to discuss [name]'s overall prognosis with Dr. Irene [name].  I attempted to elicit values and goals of care important to the patient.  If his illness is reversible, [place] will want to push forward, continue PT, continue chemo and return to the hospital if needed.  If his illness is not reversible - [name] feels they will would be more likely to change their plans and goals.  At this point she is still hopeful he will walk and play the piano for a client again.  We discussed code status. At this point, if [name] has cardiac or respiratory ARREST, then  DNR.  If [name] has a pulse or is breathing then the couple would be open to full scope treatment including intubation.  The couple would not consider a tracheostomy.  I spent approximately 45 minutes listening to [name] describe the admissions process.  She was quite upset about the admitting team and their communication with the two of them.  Questions and concerns were addressed.  The family was encouraged to call with questions or concerns.    Primary Decision Maker:  PATIENT and wife [name]    SUMMARY OF RECOMMENDATIONS     Family would be grateful for Dr. Grier [name] input regarding patient's overall ability to recover.  The patient's 2 weeks of lethargy has resolved after receiving 2 units of packed RBCs  Wife is agreeable to DNR in the case of ARREST, but full scope treatment otherwise.    She would never want chest compressions.  Patient is eligible for Hospice services in his home if he opts for no further aggressive treatment and a focus on comfort.  PMT will continue to follow to help support the patient and his wife in their decision making process.  Code Status/Advance Care Planning:   DNR in the case of ARREST, but full scope treatment otherwise.     Symptom Management:   Repositioning for back pain  Humidify oxygen for congestion.  Tylenol for pain.  Consider fentanyl if needed given his chronically low BP  Additional Recommendations (Limitations, Scope, Preferences):  Full Scope Treatment  Palliative Prophylaxis:   Frequent Pain Assessment and Turn Reposition  Psycho-social/Spiritual:   Desire for further Chaplaincy support: yes  Prognosis:  At risk for an acute event that could end his life suddenly given his frailty, hypotension, hypoxia, hypothermia.  If he stabilizes I still feel his prognosis is less than 6 months.  If he decompensates he will be appropriate for Hospice House (2 weeks or less)  Discharge Planning: To Be Determined.  Wife strongly  inclined to avoid rehab and care for him at home.      Primary Diagnoses: Present on Admission: . Acute on chronic combined systolic and diastolic CHF (congestive heart failure) ([place]) . Acute-on-chronic respiratory failure ([place]) . Empyema ([place]) . Elevated troponin . Elevated brain natriuretic peptide (BNP) level . Bilateral pleural effusion . Hypotension . Loculated pleural effusion . Pulmonary HTN ([place]) . Aspiration pneumonia ([place]) . Anemia . COPD mixed type ([place]) . Hypoalbuminemia . Protein-calorie malnutrition, severe ([place]) . Persistent atrial fibrillation ([place])   I have reviewed the medical record, interviewed the patient and family, and examined the patient. The  following aspects are pertinent.  Past Medical History:  Diagnosis Date  . Anxiety   . Arthritis    "neck" (08/15/2015)  . Chronic bronchitis ([place])   . Chronic diastolic CHF (congestive heart failure) ([place])   . Constipation   . COPD (chronic obstructive pulmonary disease) ([place])   . DDD (degenerative disc disease), cervical   . Depression   . Dilated aortic root ([place])    11m on echo 05/2016  . Edema of left lower extremity   . Facial basal cell cancer   . H/O acne vulgaris 1960s   [facility]  . Headache    history of - left temporal- years ago- not current (08/15/2015)  . Persistent atrial fibrillation ([facility])    on coumadin with CHADS2VASC score of 2  . Pneumonia 1960s; 2015 X 2  . Prostate cancer ([facility] dx'd early 2000s   "low spreading; non aggressive type" (08/15/2015)  . Pulmonary HTN ([place]    PASP 474mg by echo 05/2016  . Skin cancer    "back"   Social History   Socioeconomic History  . Marital status: Married    Spouse name: Not on file  . Number of children: Not on file  . Years of education: Not on file  . Highest education level: Not on file  Occupational History  . Occupation: Retired  [employer] laboratory technician. Financial resource strain: Not on file  .  Food insecurity:    Worry: Not on file    Inability: Not on file  . Transportation needs:    Medical: Not on file    Non-medical: Not on file  Tobacco Use  . Smoking status: Former Smoker    Packs/day: 1.00    Years: 62.00    Pack years: 62.00    Types: Cigarettes    Last attempt to quit: 05/14/2016    Years since quitting: 1.3  . Smokeless tobacco: Never Used  Substance and Sexual Activity  . Alcohol use: No    Alcohol/week: 0.0 oz  . Drug use: No    Comment: CBD OIL  . Sexual activity: Not Currently    Partners: Female  Lifestyle  . Physical activity:    Days per week: Not on file    Minutes per session: Not on file  . Stress: Not on file  Relationships  . Social connections:    Talks on phone: Not on file    Gets together: Not on file    Attends religious service: Not on file    Active member of club or organization: Not on file    Attends meetings of clubs or organizations: Not on file    Relationship status: Not on file  Other Topics Concern  . Not on file  Social History Narrative   Patient lives with his wife who is a great deal of help in his care   Family History  Problem Relation Age of Onset  . Colon cancer Mother   . Ovarian cancer Mother   . Alcoholism Father   . CAD Father   . Alcohol abuse Father   . Ovarian cancer Sister   . Diabetes Neg Hx   . Stomach cancer Neg Hx    Scheduled Meds: . acyclovir  400 mg Oral Daily  . albuterol  2.5 mg Nebulization TID  . folic acid  1 mg Oral Daily  . miconazole nitrate  1 application Topical BID  . midodrine  10 mg  Oral TID WC  . pregabalin  50 mg Oral BID  . rivaroxaban  20 mg Oral Daily  . senna-docusate  1 tablet Oral BID   Continuous Infusions: PRN Meds:.acetaminophen **OR** [DISCONTINUED] acetaminophen, albuterol, morphine, MUSCLE RUB, ondansetron **OR** ondansetron (ZOFRAN) IV, prochlorperazine Allergies  Allergen Reactions  . Demerol [Meperidine] Other (See Comments)    UNSPECIFIED REACTION    Causes system to shutdown. ?   . Oxycodone Hcl Other (See Comments)    Pt states this medication 'wires him up' and makes pt hyper; pt does not want to take this ever again   Review of Systems at this moment complains of spasm under his right shoulder blade, congestion.  No SOB, No pain.  Physical Exam  Chronically ill appearing male, emotionally labile. Awake, Alert, Coherent. CV rrr resp no distress on oxygen Abdomen thin, NT, ND Ext edematous RUE  Vital Signs: BP 95/68   Pulse 70   Temp 97.7 F (36.5 C) (Oral)   Resp 15   Ht 5' 11" (1.803 m)   Wt 63 kg (138 lb 14.2 oz)   SpO2 100%   BMI 19.37 kg/m  Pain Scale: 0-10   Pain Score: 8    SpO2: SpO2: 100 % O2 Device:SpO2: 100 % O2 Flow Rate: .O2 Flow Rate (L/min): 3 L/min  IO: Intake/output summary:   Intake/Output Summary (Last 24 hours) at 09/29/2017 1157 Last data filed at 09/29/2017 0800 Gross per 24 hour  Intake 720 ml  Output 950 ml  Net -230 ml    LBM: Last BM Date: 09/28/17 Baseline Weight: Weight: 63 kg (138 lb 12.8 oz) Most recent weight: Weight: 63 kg (138 lb 14.2 oz)     Palliative Assessment/Data: 30%     Time In: 10:00 Time Out: 12:30 Time Total: 2.5 hours Greater than 50%  of this time was spent counseling and coordinating care related to the above assessment and plan.  Signed by: Marianne Dellinger, PA-C Palliative Medicine Pager: [phone]  Please contact Palliative Medicine Team phone at [phone] for questions and concerns.  For individual provider: See Amion              

## 2017-09-30 ENCOUNTER — Telehealth: Payer: Self-pay | Admitting: Hematology

## 2017-09-30 ENCOUNTER — Inpatient Hospital Stay: Payer: Medicare Other | Admitting: Hematology

## 2017-09-30 LAB — COMPREHENSIVE METABOLIC PANEL
ALBUMIN: 1.6 g/dL — AB (ref 3.5–5.0)
ALK PHOS: 258 U/L — AB (ref 38–126)
ALT: 11 U/L — AB (ref 17–63)
ANION GAP: 6 (ref 5–15)
AST: 21 U/L (ref 15–41)
BILIRUBIN TOTAL: 0.5 mg/dL (ref 0.3–1.2)
BUN: 30 mg/dL — AB (ref 6–20)
CALCIUM: 7.8 mg/dL — AB (ref 8.9–10.3)
CO2: 35 mmol/L — ABNORMAL HIGH (ref 22–32)
CREATININE: 1.03 mg/dL (ref 0.61–1.24)
Chloride: 99 mmol/L — ABNORMAL LOW (ref 101–111)
GFR calc Af Amer: >60 mL/min (ref >60)
GFR calc non Af Amer: >60 mL/min (ref >60)
GLUCOSE: 99 mg/dL (ref 65–99)
Potassium: 4.3 mmol/L (ref 3.5–5.1)
Sodium: 140 mmol/L (ref 135–145)
TOTAL PROTEIN: 4 g/dL — AB (ref 6.5–8.1)

## 2017-09-30 LAB — CBC
HCT: 35.5 % — ABNORMAL LOW (ref 39.0–52.0)
HEMOGLOBIN: 11.3 g/dL — AB (ref 13.0–17.0)
MCH: 31.2 pg (ref 26.0–34.0)
MCHC: 31.8 g/dL (ref 30.0–36.0)
MCV: 98.1 fL (ref 78.0–100.0)
PLATELETS: 160 10*3/uL (ref 150–400)
RBC: 3.62 MIL/uL — ABNORMAL LOW (ref 4.22–5.81)
RDW: 17.4 % — AB (ref 11.5–15.5)
WBC: 6.3 10*3/uL (ref 4.0–10.5)

## 2017-09-30 LAB — MAGNESIUM: MAGNESIUM: 1.7 mg/dL (ref 1.7–2.4)

## 2017-09-30 MED ORDER — FUROSEMIDE 40 MG PO TABS
40.0000 mg | ORAL_TABLET | Freq: Two times a day (BID) | ORAL | Status: DC
  Administered 2017-10-01 – 2017-10-06 (×10): 40 mg via ORAL
  Filled 2017-09-30 (×11): qty 1

## 2017-09-30 MED ORDER — FUROSEMIDE 40 MG PO TABS: 40.0000 mg | ORAL_TABLET | Freq: Two times a day (BID) | ORAL | Status: DC

## 2017-09-30 NOTE — Progress Notes (Signed)
[name]  [name]  CLE:[id number] DOB: 1945/06/05 DOA: 09/27/2017 PCP: Biagio Borg, MD    Brief Narrative:  72 y.o. male with a hx of diastolic CHF (EF of 94%), atrial fibrillation on Xarelto, COPD on home oxygen at 3 L, malignant lymphoplasmayctic lymphoma, and a hospitalization for acute on chronic heart failure secondary to cardiac amyloidosis 09/18/17 > 09/26/17 who presented to the ED w/ recurrent hypoxic respiratory failure with multifactorial etiology.   Patient was discharged from [facility] the evening prior to his return to the ED. During that admission he diuresed 7 L. He was sent home on 4L [place], increased from his prior 3L.    Significant Events: 5/17 d/c from [facility] 5/18 admit to [facility]   Subjective: The patient states he is feeling better in general.  He reports a good appetite.  He denies shortness of breath at the present time.  After having initially decreased his lower extremity swelling appears to be slowly increasing now.   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 - there is no short term fix for this situation - ongoing volume overload is expected - attempts to diurese aggressively will likely lead to poor organ perfusion and worsening of chronic hypotension - I have discussed this with he and his wife at length and attempted to explain the reasoning - with climbing weight will attempt to resume Lasix in the morning - he will likely need a when necessary Lasix dosing strategy based on his weight and not a standard scheduled dose - I have asked nutrition to counsel he and his wife on an appropriate protein replacement diet  Hypothermia No clear acute infection - tx w/ bair hugger and follow clinically - Temp appears to be trending upward   Chronic diastolic heart failure - Suspected cardiac AL amyloidosis Normal LV fxn on TTE - no new suggestions per Cards - felt to be related to amyloid deposition - as discussed above  diuretic strategy will likely need to be based on weight due to complicating hypotension/intravascular volume depletion -to f/u w/ Dr. 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 - see discussion above   Chronic pain Comfortable presently   Severe protein calorie malnutrition Due to nephrotic range proteinuria - adjust diet per Nutrition   COPD No acute exacerbation at this time   Anemia with iron and folate deficiency Transfused 2U PRBC this admit - this has helped from an oncotic standpoint, but his Hgb actually never dropped to a point that transfusion is indicated further   Pressure injury of the sacrum Care per WOC protocol  Hematuria and urinary retention Foley cath to remain in place   Chronic B pleural effusion Status post thoracentesis 09/22/2017 - CT chest this admit raises concern for possible loculation on R - not clinically signif at this time  - f/u CXR in AM   DVT prophylaxis: Xarelto  Code Status: DNR - NO CODE  BLUE  Family Communication:  Spoke with wife at bedside at length Disposition Plan: SDU  Consultants:  Cardiology  Antimicrobials:  Cefepime 5/18 > 5/19 Vanc 5/18 > 5/19  Objective: Blood pressure 92/67, pulse 79, temperature 97.6 F (36.4 C), temperature source Oral, resp. rate 19, height _0  (1.803 m), weight 65.1 kg (143 lb 8.3 oz), SpO2 94 %.  Intake/Output Summary (Last 24 hours) at 09/30/2017 1458 Last data filed at 09/30/2017 0851 Gross per 24 hour  Intake 240 ml  Output 825 ml  Net -585 ml   Filed Weights   09/28/17 0443 09/29/17 0500 09/30/17 0500  Weight: 63 kg  (138 lb 12.8 oz) 63 kg (138 lb 14.2 oz) 65.1 kg (143 lb 8.3 oz)    Examination: General: No acute respiratory distress - alert and conversant    Lungs: blunting of breath sounds in B bases - no crackles or wheezing  Cardiovascular: Regular rate w/o M or rub  Abdom:  NT/ND, soft, bs+, no mass Ext:  2+ B LE edema   CBC: Recent Labs  Lab 09/25/17 0345 09/26/17 0455 09/27/17 0917 09/29/17 0701 09/30/17 0552  WBC 5.7 5.3 5.2 5.1 6.3  NEUTROABS 5.2 4.9 4.6  --   --   HGB 9.5* 9.3* 9.1* 12.2* 11.3*  HCT 29.0* 28.7* 28.4* 37.3* 35.5*  MCV 101.4* 102.9* 101.8* 95.9 98.1  PLT 255 228 186 145* 962   Basic Metabolic Panel: Recent Labs  Lab 09/24/17 0945 09/25/17 0345  09/27/17 0917 09/29/17 0701 09/30/17 0552  NA 139 141   < > 137 137 140  K 4.6 4.8   < > 4.9 5.4* 4.3  CL 98* 98*   < > 94* 97* 99*  CO2 31 32   < > 32 30 35*  GLUCOSE 77 144*   < > 98 85 99  BUN 43* 41*   < > 36* 29* 30*  CREATININE 1.14 1.20   < > 1.01 0.94 1.03  CALCIUM 8.3* 8.4*   < > 7.9* 7.9* 7.8*  MG 1.4* 2.1  --   --   --  1.7   < > = values in this interval not displayed.   GFR: Estimated Creatinine Clearance: 57.9 mL/min (by C-G formula based on SCr of 1.03 mg/dL).  Liver Function Tests: Recent Labs  Lab 09/27/17 0917 09/29/17 0701 09/30/17 0552  AST _1 ALT 11* 9* 11*  ALKPHOS 225* 240* 258*  BILITOT 0.5 0.9 0.5  PROT 4.0* 3.9* 4.0*  ALBUMIN 1.6* 1.7* 1.6*    Cardiac Enzymes: Recent Labs  Lab 09/27/17 0917  TROPONINI 0.09*    Recent Results (from the past 240 hour(s))  Culture, expectorated sputum-assessment     Status: None   Collection Time: 09/21/17  2:42 PM  Result Value Ref Range Status   Specimen Description SPUTUM  Final   Special Requests   Final    NONE Performed at [facility] [address], [place], [place] [address]    Sputum evaluation THIS SPECIMEN IS ACCEPTABLE FOR SPUTUM CULTURE  Final   Report Status 09/22/2017 FINAL  Final    Culture, respiratory (NON-Expectorated)     Status: None   Collection Time: 09/21/17  2:42 PM  Result Value Ref Range Status   Specimen Description SPUTUM  Final   Special Requests NONE Reflexed from [id number]  Final   Gram Stain   Final    FEW WBC PRESENT, PREDOMINANTLY PMN ABUNDANT GRAM POSITIVE RODS FEW GRAM POSITIVE COCCI IN CLUSTERS FEW GRAM NEGATIVE RODS    Culture   Final    Consistent with  normal respiratory flora. Performed at [facility], [place] [address], [place], [place] [address]    Report Status 09/24/2017 FINAL  Final  Body fluid culture     Status: None   Collection Time: 09/22/17  2:41 PM  Result Value Ref Range Status   Specimen Description   Final    PLEURAL LEFT Performed at [facility] [address] [place], [place] [address]    Special Requests   Final    NONE Performed at [facility], [place] [address], [place], [place] [address]    Gram Stain   Final    RARE WBC PRESENT, PREDOMINANTLY PMN NO ORGANISMS SEEN    Culture   Final    NO GROWTH 3 DAYS Performed at [facility] [address], [place], [place] [address]    Report Status 09/26/2017 FINAL  Final  Blood culture (routine x 2)     Status: None (Preliminary result)   Collection Time: 09/27/17  9:52 AM  Result Value Ref Range Status   Specimen Description BLOOD RIGHT CHEST  Final   Special Requests   Final    BOTTLES DRAWN AEROBIC AND ANAEROBIC Blood Culture results may not be optimal due to an excessive volume of blood received in culture bottles   Culture   Final    NO GROWTH 3 DAYS Performed at [facility], [place] [address], [place], [place] [address]    Report Status PENDING  Incomplete  Blood culture (routine x 2)     Status: None (Preliminary result)   Collection Time: 09/27/17 10:30 AM  Result Value Ref Range Status   Specimen Description BLOOD RIGHT HAND  Final   Special Requests   Final    BOTTLES DRAWN AEROBIC AND ANAEROBIC  Blood Culture results may not be optimal due to an inadequate volume of blood received in culture bottles   Culture   Final    NO GROWTH 3 DAYS Performed at [facility], [place] [address], [place], [place] [address]    Report Status PENDING  Incomplete  MRSA PCR Screening     Status: None   Collection Time: 09/28/17  9:04 AM  Result Value Ref Range Status   MRSA by PCR NEGATIVE NEGATIVE Final    Comment:        The GeneXpert MRSA Assay (FDA approved for NASAL specimens only), is one component of a comprehensive MRSA colonization surveillance program. It is not intended to diagnose MRSA infection nor to guide or monitor treatment for MRSA infections. Performed at [facility], [place] [address], [place],  [address]      Scheduled Meds: . acyclovir  400 mg Oral Daily  . folic acid  1 mg Oral Daily  . miconazole nitrate  1 application Topical BID  . midodrine  10 mg Oral TID WC  . pregabalin  50 mg Oral BID  . rivaroxaban  20 mg Oral Daily  . senna-docusate  1 tablet Oral BID     LOS: 3 days   Cherene Altes, MD [facility] Office  [phone] Pager - Text Page per Amion as per below:  On-Call/Text Page:      [email or web address]      password [email or web address]  If 7PM-7AM, please contact night-coverage [email or web address] Password [email or web address] 09/30/2017, 2:58 PM

## 2017-10-01 ENCOUNTER — Inpatient Hospital Stay (HOSPITAL_COMMUNITY): Payer: Medicare Other

## 2017-10-01 DIAGNOSIS — R627 Adult failure to thrive: Secondary | ICD-10-CM

## 2017-10-01 DIAGNOSIS — E43 Unspecified severe protein-calorie malnutrition: Secondary | ICD-10-CM

## 2017-10-01 DIAGNOSIS — I5032 Chronic diastolic (congestive) heart failure: Secondary | ICD-10-CM

## 2017-10-01 DIAGNOSIS — I9589 Other hypotension: Secondary | ICD-10-CM

## 2017-10-01 DIAGNOSIS — J90 Pleural effusion, not elsewhere classified: Secondary | ICD-10-CM

## 2017-10-01 DIAGNOSIS — D472 Monoclonal gammopathy: Secondary | ICD-10-CM

## 2017-10-01 DIAGNOSIS — I5043 Acute on chronic combined systolic (congestive) and diastolic (congestive) heart failure: Secondary | ICD-10-CM

## 2017-10-01 DIAGNOSIS — R601 Generalized edema: Secondary | ICD-10-CM

## 2017-10-01 DIAGNOSIS — D508 Other iron deficiency anemias: Secondary | ICD-10-CM

## 2017-10-01 DIAGNOSIS — N049 Nephrotic syndrome with unspecified morphologic changes: Secondary | ICD-10-CM

## 2017-10-01 DIAGNOSIS — G894 Chronic pain syndrome: Secondary | ICD-10-CM

## 2017-10-01 LAB — BASIC METABOLIC PANEL
ANION GAP: 7 (ref 5–15)
BUN: 30 mg/dL — AB (ref 6–20)
CO2: 32 mmol/L (ref 22–32)
Calcium: 7.9 mg/dL — ABNORMAL LOW (ref 8.9–10.3)
Chloride: 99 mmol/L — ABNORMAL LOW (ref 101–111)
Creatinine, Ser: 1.01 mg/dL (ref 0.61–1.24)
GFR calc Af Amer: >60 mL/min (ref >60)
GLUCOSE: 92 mg/dL (ref 65–99)
Potassium: 4.5 mmol/L (ref 3.5–5.1)
Sodium: 138 mmol/L (ref 135–145)

## 2017-10-01 LAB — CBC
HCT: 36.8 % — ABNORMAL LOW (ref 39.0–52.0)
Hemoglobin: 11.6 g/dL — ABNORMAL LOW (ref 13.0–17.0)
MCH: 31.5 pg (ref 26.0–34.0)
MCHC: 31.5 g/dL (ref 30.0–36.0)
MCV: 100 fL (ref 78.0–100.0)
PLATELETS: 161 10*3/uL (ref 150–400)
RBC: 3.68 MIL/uL — ABNORMAL LOW (ref 4.22–5.81)
RDW: 17 % — ABNORMAL HIGH (ref 11.5–15.5)
WBC: 7 10*3/uL (ref 4.0–10.5)

## 2017-10-01 NOTE — Progress Notes (Addendum)
Initial Nutrition Assessment  DOCUMENTATION CODES:   Non-severe (moderate) malnutrition in context of chronic illness  INTERVENTION:    Magic cup po TID with meals, each supplement provides 290 kcal and 9 gm of protein  Boost Breeze po TID between meals, each supplement provides 250 kcal and 9 gm of protein  NUTRITION DIAGNOSIS:   Moderate Malnutrition related to chronic illness(malignant lymphoplasmayctic lymphoma, COPD, CHF) as evidenced by moderate fat depletion, severe fat depletion  GOAL:   Patient will meet greater than or equal to 90% of their needs  MONITOR:   PO intake, Supplement acceptance, Weight trends, Labs, I & O's, Skin  REASON FOR ASSESSMENT:   Consult Assessment of nutrition requirement/status  ASSESSMENT:   72 y.o. 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   RD spoke with pt at bedside. He reports his appetite is "pretty good". PO intake 50% of breakfast (pancakes, bacon, coffee, milk).  Pt shares his appetite had been decreased for the last month or so but it's back. After RD asked if pt consumes 3 meals per day pt stated "nothing that's regular". Pt likes Ensure Enlive supplements, however, it gives him loose stools.  He is excited to try [employer] and agreeable to [employer] supplements. Pt says he's gained weight but he feels it's likely due to fluid accumulation. Labs and medications reviewed. BUN 30 (H).  Albumin has a half-life of 21 days and is strongly affected by stress response and inflammatory process, therefore, do not expect to see an improvement in this lab value during acute hospitalization.  NUTRITION - FOCUSED PHYSICAL EXAM:    Most Recent Value  Orbital Region  Moderate depletion  Upper Arm Region   Severe depletion  Thoracic and Lumbar Region  Unable to assess  Buccal Region  Moderate depletion  Temple Region  Moderate depletion  Clavicle Bone Region  Moderate depletion  Clavicle and Acromion Bone Region  Severe depletion  Scapular Bone Region  Unable to assess  Dorsal Hand  Unable to assess  Patellar Region  Severe depletion  Anterior Thigh Region  Severe depletion  Posterior Calf Region  Severe depletion  Edema (RD Assessment)  Moderate (bilateral lower extremities)     Diet Order:   Diet Order           Diet regular Room service appropriate? Yes; Fluid consistency: Thin  Diet effective now         EDUCATION NEEDS:   No education needs have been identified at this time  Skin:  Skin Assessment: Skin Integrity Issues: Skin Integrity Issues:: Stage I, Stage II Stage I:  coccyx Stage II: sacrum  Last BM:  5/21  Height:   Ht Readings from Last 1 Encounters:  09/28/17 5' 11" (1.803 m)   Weight:   Wt Readings from Last 1 Encounters:  09/30/17 143 lb 8.3 oz (65.1 kg)   Ideal Body Weight:  67.2 kg  BMI:  Body mass index is 20.02 kg/m.  Estimated Nutritional Needs:   Kcal:  2000-2200  Protein:  105-115 gm  Fluid:  2.0-2.2 L  Arthur Holms, RD, LDN Pager #: [phone] After-Hours Pager #: [phone]

## 2017-10-01 NOTE — Progress Notes (Signed)
Daily Progress Note   Patient Name: [name]       Date: 10/01/2017 DOB: 04-Nov-1945  Age: 72 y.o. MRN#: [id number] Attending Physician: Allie Bossier, MD Primary Care Physician: Biagio Borg, MD Admit Date: 09/27/2017  Reason for Consultation/Follow-up: Establishing goals of care and Psychosocial/spiritual support  Subjective: Spoke with patient at bedside.  He feels after his talk with the doctor yesterday that he is on the road to recovery.  He was in good spirits. We talked about is nutritional status.  I mentioned that I was concerned his low protein may be at least partially due to nephrotic syndrome that is a consequence of amyloidosis and that it will be very difficult to improve.  He spoke again of his love for his wife.  He states he is ok with dying but will keep going because he is very concerned about what will happen to his wife when he dies.  We discussed her close relationship with her children and her strength of spirit.  He is very happy with the care he is receiving on 2W.  He feels the response time is fast.  "I'm being treated like a king".   Assessment: 2W06 severely deconditioned male who is having ongoing problems with hypoalbuminemia (that is a result of amyloidosis?) and subsequently low oncotic pressure and pleural effusions.  His BP remains low.  Consequently he is hard to diuresis.  He is in a fragile state and at high risk for recurrent decompensation.   Patient Profile/HPI:  72 y.o. male  with past medical history of AL amyloidosis with Waldenstrom's macroglobinemia, prostate cancer, COPD on home oxygen (3L), diastolic heart failure, and degenerative disc disease with chronic back pain who was admitted on 09/27/2017 with lethargy, hypoxic respiratory  failure, hypotensive with hypothermia.   Augie remained lethargic through out his stay at [facility] and thru [facility].  His mental status returned to normal after receiving two units of blood this admission.    Of note, the patient had been discharged less than 24 hours earlier after an 8 day stay at [facility] for similar symptoms.  Length of Stay: 4  Current Medications: Scheduled Meds:  . acyclovir  400 mg Oral Daily  . folic acid  1 mg Oral Daily  . furosemide  40 mg Oral BID  .  miconazole nitrate  1 application Topical BID  . midodrine  10 mg Oral TID WC  . pregabalin  50 mg Oral BID  . rivaroxaban  20 mg Oral Daily  . senna-docusate  1 tablet Oral BID    Continuous Infusions:   PRN Meds: acetaminophen **OR** [DISCONTINUED] acetaminophen, albuterol, morphine, MUSCLE RUB, ondansetron **OR** ondansetron (ZOFRAN) IV, prochlorperazine, sodium chloride  Physical Exam        Thin frail chronically ill appearing male, awake, alert, coherent, in good spirits.  Vital Signs: BP (!) 82/61 (BP Location: Left Arm)   Pulse 68   Temp 97.6 F (36.4 C) (Oral)   Resp 14   Ht _0  (1.803 m)   Wt 65.1 kg (143 lb 8.3 oz)   SpO2 93%   BMI 20.02 kg/m  SpO2: SpO2: 93 % O2 Device: O2 Device: Nasal Cannula O2 Flow Rate: O2 Flow Rate (L/min): 3 L/min  Intake/output summary:   Intake/Output Summary (Last 24 hours) at 10/01/2017 1158 Last data filed at 10/01/2017 1015 Gross per 24 hour  Intake 250 ml  Output 775 ml  Net -525 ml   LBM: Last BM Date: 09/30/17 Baseline Weight: Weight: 63 kg (138 lb 12.8 oz) Most recent weight: Weight: 65.1 kg (143 lb 8.3 oz)       Palliative Assessment/Data: 30%    Flowsheet Rows     Most Recent Value  Intake Tab  Referral Department  -- [ED]  Unit at Time of Referral  ER  Palliative Care Primary Diagnosis  Cancer  Date Notified  09/27/17  Palliative Care Type  Return patient Palliative Care  Reason for referral  Clarify Goals of Care  Date of  Admission  09/27/17  Date first seen by Palliative Care  09/29/17  # of days Palliative referral response time  2 Day(s)  # of days IP prior to Palliative referral  0  Clinical Assessment  Psychosocial & Spiritual Assessment  Palliative Care Outcomes      Patient Active Problem List   Diagnosis Date Noted  . Lethargy   . Acute on chronic respiratory failure ([place]) 09/27/2017  . Anemia 09/27/2017  . Pressure injury of sacral region, stage 2 09/24/2017  . Pleural effusion on left   . Elevated troponin 09/19/2017  . Tinea cruris 09/19/2017  . Generalized weakness 09/19/2017  . Diastolic CHF ([place]) 81/19/1478  . Port or reservoir infection 07/22/2017  . Port-A-Cath in place 07/22/2017  . Palliative care encounter   . Malnutrition of moderate degree 05/02/2017  . Pressure injury of skin 05/01/2017  . Syncope and collapse 04/30/2017  . Right-sided thoracic back pain 03/26/2017  . Insomnia 03/26/2017  . Lymphedema of left arm 03/26/2017  . 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 Counseling regarding advanced care planning and goals of care 03/24/2017  . Pain management contract signed 03/10/2017  . Chronic generalized pain disorder 03/04/2017  . Light chain (AL) amyloidosis (HCC)   . Nephrotic syndrome   . Proteinuria   . Dyspnea   . Left arm swelling   . Acute on chronic diastolic CHF (congestive heart failure) ([place])   . Elevated brain natriuretic peptide (BNP) level   . Acute on chronic diastolic (congestive) heart failure ([place]) 02/21/2017  . Hypotension 02/21/2017  . Fluid overload 02/21/2017  . Mediastinal lymphadenopathy 02/10/2017  . Adjustment disorder with depressed mood 12/03/2016  . Bacteremia due to Streptococcus pneumoniae 11/19/2016  . Sepsis ([place]) 11/19/2016  . Hypoalbuminemia 11/19/2016  . Right leg pain 11/19/2016  .  AKI (acute kidney injury) ([place]) 11/19/2016  . Acute urinary retention 11/19/2016  . Hyponatremia 11/18/2016  . Aspiration  pneumonia ([place]) 11/18/2016  . HCAP (healthcare-associated pneumonia) 11/18/2016  . History of prostate cancer 11/15/2016  . Chronic post-operative pain 11/15/2016  . Persistent atrial fibrillation ([place])   . Acute on chronic combined systolic and diastolic CHF (congestive heart failure) ([place])   . Pulmonary HTN ([place])   . Dilated aortic root ([place])   . Septic shock ([place])   . Pedal edema   . Acute-on-chronic respiratory failure ([place]) 05/21/2016  . Lung mass 05/21/2016  . Loculated pleural effusion 05/21/2016  . Empyema ([place]) 05/21/2016  . S/P thoracentesis   . Bilateral pleural effusion 05/14/2016  . COPD mixed type ([place]) 05/14/2016  . Tobacco abuse 05/14/2016  . Protein-calorie malnutrition, severe ([place]) 05/14/2016    Palliative Care Plan    Recommendations/Plan:  Family on - board for full scope treatment.  They are hopeful for improvement. (to be able to walk and play the piano)  Appropriate for Palliative follow up outpatient.  PMT will shadow the chart for decline and the need to re-engage, but will follow at a distance for now - as the family's goals are set.  Goals of Care and Additional Recommendations:  Limitations on Scope of Treatment: Full Scope Treatment  Code Status:  DNR with full scope treatment  Prognosis:   Unable to determine   Discharge Planning:  Home with [place] was discussed with Dr. Sherral Hammers.  Thank you for allowing the Palliative Medicine Team to assist in the care of this patient.  Total time spent:  25 min.     Greater than 50%  of this time was spent counseling and coordinating care related to the above assessment and plan.  Florentina Jenny, PA-C Palliative Medicine  Please contact Palliative MedicineTeam phone at [phone] for questions and concerns between 7 am - 7 pm.   Please see AMION for individual provider pager numbers.

## 2017-10-01 NOTE — Progress Notes (Signed)
PROGRESS NOTE    [name]  DGR:[id number] DOB: [birth year] DOA: 09/27/2017 PCP: Biagio Borg, MD   Brief Narrative:  72 y.o.WM PMHx  Chronic diastolic CHF (EF of 23%), atrial fibrillation on Xarelto, COPD on home oxygen at 3 L, malignant lymphoplasmayctic lymphoma, and a hospitalization for acute on chronic heart failure secondary to cardiac amyloidosis 09/18/17 > 09/26/17   Presented to the ED w/ recurrent hypoxic respiratory failure with multifactorial etiology.    Patient was discharged from [facility] the evening prior to his return to the ED. During that admission he diuresed 7 L. He was sent home on 4L [place], increased from his prior 3L.        Subjective: 5/22 A/O x4, positive acute on chronic S OB, negative CP, negative abdominal pain.     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 Brunetta Genera - Currently undergoing chemotherapy Bendamustine/Rituxan.  Believe patient has completed 4 cycles with partial response.  IgG kappa MGUS  -Per Dr. Grier Mitts oncology note patient also diagnosed with MGUS.  No overt evidence of cardiac involvement based on ECHO     Anasarca - Secondary to low oncotic state.  Albumin 1.6. -Cannot aggressively diuresis as this will lead to hypoperfusion and worsening of endorgan function. -See CHF     Hypothermia -Negative indication of infection, most likely secondary to poor nutritional status.   -Continue bear hugger   Chronic diastolic CHF -Believe secondary to cardiac AL amyloidosis.  No new suggestions per cardiology -Strict in and out - Daily weight -Lasix 40 mg BID - Diuretic strategy will be based on patient's weight.  Will need to adjust Lasix dosing dependent on daily weight.  Dr. Thereasa Solo addressed this issue with patient and wife however not sure they will be able to follow this complicated regimen.  Will most likely need SNF placement or daily home health aide to assist them - Follow-up with Dr. Aundra Dubin as outpatient  Chronic hypotension - Midodrine 10 mg TID  Persistent atrial fibrillation history of failed DCCV - Xarelto for anticoagulation - rate remains controlled   COPD    Nephrotic syndrome (by biopsy) -Secondary to AL amyloidosis - see discussion above  Recent Labs  Lab 09/26/17 0455 09/27/17 0917 09/29/17 0701 09/30/17 0552 10/01/17 0538 10/02/17 0420  CREATININE 0.94 1.01 0.94 1.03 1.01 1.05     Chronic pain -Chronic back pain evaluated by MRI T-spine on 03/26/2017- for metastatic disease or fracture - Currently comfortable.     Severe protein calorie malnutrition -Calorie count in progress -If patient not consuming adequate calories will start on appetite stimulant: Megace? -Adjust diet per nutrition    Iron deficiency anemia/folate deficiency anemia - Transfuse 2 units PRBC   Pressure injury of sacrum -Care per WOC protocol   Hematuria and urinary retention Foley cath to remain in place    Chronic B  pleural effusion -5/13 S/P thoracentesis (previous admission) - CT chest this admit raises concern for possible loculation on RIGHT (clinical significance?)     DVT prophylaxis: Xarelto Code Status: DNR Family Communication: At bedside for discussion of plan of care Disposition Plan: TBD   Consultants:   Cardiology     Procedures/Significant Events:  5/17 d/c from [facility] 5/18 admit to [facility]     I have personally reviewed and interpreted all radiology studies and my findings are as above.  VENTILATOR SETTINGS:    Cultures   Antimicrobials: Anti-infectives (From admission, onward)   Start     Stop   09/28/17 1000  acyclovir (ZOVIRAX) tablet 400 mg         09/27/17 2300  vancomycin (VANCOCIN) IVPB 750 mg/150 ml premix  Status:  Discontinued     09/27/17 1122   09/27/17 2300  vancomycin (VANCOCIN) 500 mg in sodium chloride 0.9 % 100 mL IVPB  Status:  Discontinued     09/28/17 1529   09/27/17 1800  ceFEPIme (MAXIPIME) 1 g in sodium chloride 0.9 % 100 mL IVPB  Status:  Discontinued     09/28/17 1529   09/27/17 0945  vancomycin (VANCOCIN) IVPB 1000 mg/200 mL premix     09/27/17 1230   09/27/17 0945  ceFEPIme (MAXIPIME) 2 g in sodium chloride 0.9 % 100 mL IVPB     09/27/17 1111       Devices    LINES / TUBES:      Continuous Infusions:   Objective: Vitals:   09/30/17 2014 10/01/17 0009 10/01/17 0329 10/01/17 0719  BP: (!) 86/63 (!) 86/61 98/76 (!) 84/64  Pulse: 78 80 83 72  Resp: _0 Temp: (!) 97.4 F (36.3 C) 97.7 F (36.5 C) (!) 97.5 F (36.4 C) (!) 97.5 F (36.4 C)  TempSrc: Oral Oral Oral Oral  SpO2: 95% (!) 89% (!) 89% 100%  Weight:      Height:        Intake/Output Summary (Last 24 hours) at 10/01/2017 0755 Last data filed at 10/01/2017 8366 Gross per 24 hour  Intake 50 ml  Output 1125 ml  Net -1075 ml   Filed Weights   09/28/17 0443 09/29/17 0500 09/30/17 0500  Weight: 138 lb 12.8 oz (63 kg) 138 lb 14.2 oz (63 kg) 143 lb 8.3 oz (65.1 kg)    Examination:  General: A/O x4, positive chronic respiratory distress (on 3 L O2 via [place]), cachectic Neck:  Negative scars, masses, torticollis, lymphadenopathy, JVD Lungs: Clear to auscultation bilaterally without wheezes or crackles Cardiovascular: Regular rate and rhythm without murmur gallop  or rub normal S1 and S2 Abdomen: negative abdominal pain, nondistended, positive soft, bowel sounds, no rebound, no ascites, no appreciable mass Extremities: mild extremity edema (per RN decreased since yesterday)  Skin: Negative rashes, lesions, ulcers Psychiatric:  Negative depression, negative anxiety, negative fatigue, negative mania, does not appear patient and your wife have good understanding of current disease process i.e. amyloidosis Central nervous system:  Cranial nerves II through XII intact, tongue/uvula midline, all extremities muscle strength 5/5, sensation intact throughout,  negative dysarthria, negative expressive aphasia, negative receptive aphasia.  .     Data Reviewed: Care during the described time interval was provided by me .  I have reviewed this patient's available data, including medical history, events of note, physical examination, and all test results as part of my evaluation.   CBC: Recent Labs  Lab 09/24/17 0945 09/25/17 0345 09/26/17 0455  09/27/17 0917 09/29/17 0701 09/30/17 0552 10/01/17 0538  WBC 6.4 5.7 5.3 5.2 5.1 6.3 7.0  NEUTROABS 6.1 5.2 4.9 4.6  --   --   --   HGB 8.9* 9.5* 9.3* 9.1* 12.2* 11.3* 11.6*  HCT 27.8* 29.0* 28.7* 28.4* 37.3* 35.5* 36.8*  MCV 102.2* 101.4* 102.9* 101.8* 95.9 98.1 100.0  PLT 243 255 228 186 145* 160 585   Basic Metabolic Panel: Recent Labs  Lab 09/24/17 0945 09/25/17 0345 09/26/17 0455 09/27/17 0917 09/29/17 0701 09/30/17 0552 10/01/17 0538  NA 139 141 139 137 137 140 138  K 4.6 4.8 4.4 4.9 5.4* 4.3 4.5  CL 98* 98* 94* 94* 97* 99* 99*  CO2 31 32 35* 32 30 35* 32  GLUCOSE 77 144* 104* 98 85 99 92  BUN 43* 41* 39* 36* 29* 30* 30*  CREATININE 1.14 1.20 0.94 1.01 0.94 1.03 1.01  CALCIUM 8.3* 8.4* 8.1* 7.9* 7.9* 7.8* 7.9*  MG 1.4* 2.1  --   --   --  1.7  --    GFR: Estimated Creatinine Clearance: 59.1 mL/min (by C-G formula based on SCr of 1.01 mg/dL). Liver Function Tests: Recent Labs  Lab  09/27/17 0917 09/29/17 0701 09/30/17 0552  AST _0 ALT 11* 9* 11*  ALKPHOS 225* 240* 258*  BILITOT 0.5 0.9 0.5  PROT 4.0* 3.9* 4.0*  ALBUMIN 1.6* 1.7* 1.6*   No results for input(s): LIPASE, AMYLASE in the last 168 hours. No results for input(s): AMMONIA in the last 168 hours. Coagulation Profile: No results for input(s): INR, PROTIME in the last 168 hours. Cardiac Enzymes: Recent Labs  Lab 09/27/17 0917  TROPONINI 0.09*   BNP (last 3 results) No results for input(s): PROBNP in the last 8760 hours. HbA1C: No results for input(s): HGBA1C in the last 72 hours. CBG: No results for input(s): GLUCAP in the last 168 hours. Lipid Profile: No results for input(s): CHOL, HDL, LDLCALC, TRIG, CHOLHDL, LDLDIRECT in the last 72 hours. Thyroid Function Tests: Recent Labs    09/29/17 0701  TSH 2.996   Anemia Panel: No results for input(s): VITAMINB12, FOLATE, FERRITIN, TIBC, IRON, RETICCTPCT in the last 72 hours. Urine analysis:    Component Value Date/Time   COLORURINE YELLOW 09/23/2017 0930   APPEARANCEUR HAZY (A) 09/23/2017 0930   LABSPEC 1.009 09/23/2017 0930   PHURINE 5.0 09/23/2017 0930   GLUCOSEU NEGATIVE 09/23/2017 0930   HGBUR LARGE (A) 09/23/2017 0930   BILIRUBINUR NEGATIVE 09/23/2017 0930   KETONESUR NEGATIVE 09/23/2017 0930   PROTEINUR 30 (A) 09/23/2017 0930   NITRITE NEGATIVE 09/23/2017 0930   LEUKOCYTESUR NEGATIVE 09/23/2017 0930   Sepsis Labs: _1 (procalcitonin:4,lacticidven:4)  ) Recent Results (from the past 240 hour(s))  Culture, expectorated sputum-assessment     Status: None   Collection Time: 09/21/17  2:42 PM  Result Value Ref Range Status   Specimen Description SPUTUM  Final   Special Requests   Final    NONE Performed at [facility], [place] [address], [place], [place] [address]    Sputum evaluation THIS SPECIMEN IS ACCEPTABLE FOR SPUTUM CULTURE  Final   Report Status 09/22/2017 FINAL  Final  Culture,  respiratory (NON-Expectorated)     Status: None   Collection Time: 09/21/17  2:42 PM  Result Value Ref Range Status   Specimen Description SPUTUM  Final   Special Requests NONE Reflexed from [id number]  Final   Gram Stain   Final    FEW WBC PRESENT, PREDOMINANTLY PMN  ABUNDANT GRAM POSITIVE RODS FEW GRAM POSITIVE COCCI IN CLUSTERS FEW GRAM NEGATIVE RODS    Culture   Final    Consistent with normal respiratory flora. Performed at [facility], [place] [address], [place], [place] [address]    Report Status 09/24/2017 FINAL  Final  Body fluid culture     Status: None   Collection Time: 09/22/17  2:41 PM  Result Value Ref Range Status   Specimen Description   Final    PLEURAL LEFT Performed at [facility] [address], [place], [place] [address]    Special Requests   Final    NONE Performed at [facility], [place] [address], [place], [place] [address]    Gram Stain   Final    RARE WBC PRESENT, PREDOMINANTLY PMN NO ORGANISMS SEEN    Culture   Final    NO GROWTH 3 DAYS Performed at [facility] [address], [place], [place] [address]    Report Status 09/26/2017 FINAL  Final  Blood culture (routine x 2)     Status: None (Preliminary result)   Collection Time: 09/27/17  9:52 AM  Result Value Ref Range Status   Specimen Description BLOOD RIGHT CHEST  Final   Special Requests   Final    BOTTLES DRAWN AEROBIC AND ANAEROBIC Blood Culture results may not be optimal due to an excessive volume of blood received in culture bottles   Culture   Final    NO GROWTH 3 DAYS Performed at [facility], [place] [address] [place], [place] [address]    Report Status PENDING  Incomplete  Blood culture (routine x 2)     Status: None (Preliminary result)   Collection Time: 09/27/17 10:30 AM  Result Value Ref Range Status   Specimen Description BLOOD RIGHT HAND  Final   Special Requests   Final    BOTTLES DRAWN AEROBIC AND ANAEROBIC Blood  Culture results may not be optimal due to an inadequate volume of blood received in culture bottles   Culture   Final    NO GROWTH 3 DAYS Performed at [facility], [place] [address], [place], [place] [address]    Report Status PENDING  Incomplete  MRSA PCR Screening     Status: None   Collection Time: 09/28/17  9:04 AM  Result Value Ref Range Status   MRSA by PCR NEGATIVE NEGATIVE Final    Comment:        The GeneXpert MRSA Assay (FDA approved for NASAL specimens only), is one component of a comprehensive MRSA colonization surveillance program. It is not intended to diagnose MRSA infection nor to guide or monitor treatment for MRSA infections. Performed at [facility], [place] [address], [place], [place] [address]          Radiology Studies: Dg Chest Port 1 View  Result Date: 10/01/2017 CLINICAL DATA:  Follow-up pleural effusion EXAM: PORTABLE CHEST 1 VIEW COMPARISON:  09/27/2017 FINDINGS: Cardiac shadow remains enlarged. Right chest wall port is again seen and stable. Changes of prior granulomatous disease with multiple calcified lymph nodes are again seen. Diffuse airspace opacity is again identified bilaterally right greater than left overall stable from the prior exam. Bilateral pleural effusions are noted. IMPRESSION: No change from the prior exam. Electronically Signed   By: Inez Catalina M.D.   On: 10/01/2017 07:03        Scheduled Meds: . acyclovir  400 mg Oral  Daily  . folic acid  1 mg Oral Daily  . furosemide  40 mg Oral BID  . miconazole nitrate  1 application Topical BID  . midodrine  10 mg Oral TID WC  . pregabalin  50 mg Oral BID  . rivaroxaban  20 mg Oral Daily  . senna-docusate  1 tablet Oral BID   Continuous Infusions:   LOS: 4 days    Time spent: 40 minutes    Lukas Pelcher, Geraldo Docker, MD [facility] Pager [phone]   If 7PM-7AM, please contact night-coverage [email or web address] Password [facility] 10/01/2017, 7:55 AM

## 2017-10-02 DIAGNOSIS — E852 Heredofamilial amyloidosis, unspecified: Secondary | ICD-10-CM

## 2017-10-02 DIAGNOSIS — J431 Panlobular emphysema: Secondary | ICD-10-CM

## 2017-10-02 DIAGNOSIS — G63 Polyneuropathy in diseases classified elsewhere: Secondary | ICD-10-CM

## 2017-10-02 LAB — BASIC METABOLIC PANEL
ANION GAP: 7 (ref 5–15)
BUN: 35 mg/dL — ABNORMAL HIGH (ref 6–20)
CO2: 33 mmol/L — ABNORMAL HIGH (ref 22–32)
Calcium: 8 mg/dL — ABNORMAL LOW (ref 8.9–10.3)
Chloride: 99 mmol/L — ABNORMAL LOW (ref 101–111)
Creatinine, Ser: 1.05 mg/dL (ref 0.61–1.24)
GFR calc non Af Amer: >60 mL/min (ref >60)
Glucose, Bld: 104 mg/dL — ABNORMAL HIGH (ref 65–99)
POTASSIUM: 4.7 mmol/L (ref 3.5–5.1)
SODIUM: 139 mmol/L (ref 135–145)

## 2017-10-02 LAB — CULTURE, BLOOD (ROUTINE X 2)
CULTURE: NO GROWTH
Culture: NO GROWTH

## 2017-10-02 LAB — MAGNESIUM: MAGNESIUM: 1.6 mg/dL — AB (ref 1.7–2.4)

## 2017-10-02 LAB — CBC
HEMATOCRIT: 37.5 % — AB (ref 39.0–52.0)
HEMOGLOBIN: 11.6 g/dL — AB (ref 13.0–17.0)
MCH: 30.9 pg (ref 26.0–34.0)
MCHC: 30.9 g/dL (ref 30.0–36.0)
MCV: 100 fL (ref 78.0–100.0)
Platelets: 161 10*3/uL (ref 150–400)
RBC: 3.75 MIL/uL — AB (ref 4.22–5.81)
RDW: 17 % — ABNORMAL HIGH (ref 11.5–15.5)
WBC: 7 10*3/uL (ref 4.0–10.5)

## 2017-10-02 MED ORDER — DM-GUAIFENESIN ER 30-600 MG PO TB12
1.0000 | ORAL_TABLET | Freq: Two times a day (BID) | ORAL | Status: DC
  Administered 2017-10-02 – 2017-10-09 (×14): 1 via ORAL
  Filled 2017-10-02 (×14): qty 1

## 2017-10-02 MED ORDER — PREDNISONE 20 MG PO TABS
50.0000 mg | ORAL_TABLET | Freq: Every day | ORAL | Status: DC
  Administered 2017-10-02 – 2017-10-05 (×3): 50 mg via ORAL
  Filled 2017-10-02 (×3): qty 2

## 2017-10-02 MED ORDER — IPRATROPIUM-ALBUTEROL 0.5-2.5 (3) MG/3ML IN SOLN: 3.0000 mL | Freq: Four times a day (QID) | RESPIRATORY_TRACT | Status: DC

## 2017-10-02 NOTE — Progress Notes (Signed)
   I have been reviewing the chart.  Nothing to add in the acute setting.  Difficult situation as described.  BP precludes any med titration.  Comfort is the primary issue.  Nutrition status being adjusted.  Please call if there are specific questions.  Otherwise we will sign off.

## 2017-10-02 NOTE — Care Management Important Message (Signed)
Important Message  Patient Details  Name: [name] MRN: [id number] Date of Birth: [birth year]   Medicare Important Message Given:  Yes    Jarrell Armond [address] 10/02/2017, 11:54 AM

## 2017-10-02 NOTE — Progress Notes (Signed)
PROGRESS NOTE    [name]  DDU:[id number] DOB: 08/13/1945 DOA: 09/27/2017 PCP: Biagio Borg, MD   Brief Narrative:  72 y.o.WM PMHx  Chronic diastolic CHF (EF of 23%), atrial fibrillation on Xarelto, COPD on home oxygen at 3 L, malignant lymphoplasmayctic lymphoma, and a hospitalization for acute on chronic heart failure secondary to cardiac amyloidosis 09/18/17 > 09/26/17   Presented to the ED w/ recurrent hypoxic respiratory failure with multifactorial etiology.    Patient was discharged from [facility] the evening prior to his return to the ED. During that admission he diuresed 7 L. He was sent home on 4L [place], increased from his prior 3L.        Subjective: 5/23 A/O x4, positive acute on chronic S OB, negative CP, negative abdominal pain.     Assessment & Plan:   Principal Problem:   Acute-on-chronic respiratory failure (HCC) Active Problems:   Bilateral pleural effusion   COPD mixed type (HCC)   Protein-calorie malnutrition, severe (HCC)   Loculated pleural effusion   Empyema (HCC)   Persistent atrial fibrillation (HCC)   Acute on chronic combined systolic and diastolic CHF (congestive heart failure) (HCC)   Pulmonary HTN (HCC)   Aspiration pneumonia (HCC)   Hypoalbuminemia   Hypotension   Elevated brain natriuretic peptide (BNP) level   Nephrotic syndrome   Elevated troponin   Anemia   Lethargy   Failure to thrive in adult  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 Brunetta Genera - Per patient and wife completed chemotherapy ( Bendamustine/Rituxan).  Has completed 6 cycles: Partial response PER oncology note.  -Extensive conversation with patient and wife explaining that amyloidosis is not reversible, although chemotherapy may slow further deposition.  IgG kappa MGUS  -Per Dr. Grier Mitts oncology note patient also diagnosed with MGUS.  No overt evidence of cardiac involvement based on  ECHO   Anasarca - Secondary to low oncotic state.  Albumin 1.6. -Cannot aggressively diuresis as this will lead to hypoperfusion and worsening of endorgan function. -See CHF   Hypothermia -Negative indication of infection, most likely secondary to poor nutritional status.   -Continue bear hugger PRN   Chronic diastolic CHF -Believe secondary to cardiac AL amyloidosis.  No new suggestions per cardiology -Strict in and out since admission -2.0 L - Daily weight  Filed Weights   09/28/17 0443 09/29/17 0500 09/30/17 0500  Weight: 138 lb 12.8 oz (63 kg) 138 lb 14.2 oz (63 kg) 143 lb 8.3 oz (65.1 kg)  -Lasix 40 mg BID - Diuretic strategy will be based on patient's weight.  Will need to adjust Lasix dosing dependent on daily weight.  Dr. Thereasa Solo addressed this issue with patient and wife however not sure they will be able to follow this complicated regimen.  Will most likely need SNF placement or daily home health aide to assist them - Follow-up with Dr. Aundra Dubin as outpatient  Chronic hypotension - Midodrine 10 mg TID  Persistent atrial fibrillation -History of failed DCCV -Currently rate controlled history of failed DCCV  -On Xarelto for anticoagulation  Acute on chronic respiratory failure with hypoxia/Emphysema -Multifactorial severe emphysema, bilateral pleural effusion (developing empyema RIGHT?),  Pneumonia? -Continue to treat underlying problems, mild improvement. - 5/23 prednisone 50 mg daily (short course 3 to 5 days) -Flutter valve -DuoNeb QID - Mucinex DM - Counseled patient and wife that we will start physical therapy rest if patient continues to have problems clearing lung secretions.  Chronic Bilateral pleural Effusion/ RIGHT Empyema -Patient seen by Dr. Tharon Aquas Trigt cardiothoracic surgery 12/11/2016 as outpatient follow-up for RIGHT sided VATS procedure.  At that time no further surgical intervention required. -5/13 S/P thoracentesis (previous admission) - LEFT  pleural effusion appears >> RIGHT effusion.    COPD  -By CT angiogram severe COPD - See respiratory failure   Nephrotic syndrome (by biopsy) -Secondary to AL amyloidosis - see discussion above  Recent Labs  Lab 09/26/17 0455 09/27/17 0917 09/29/17 0701 09/30/17 0552 10/01/17 0538 10/02/17 0420  CREATININE 0.94 1.01 0.94 1.03 1.01 1.05     Chronic pain -Chronic back pain evaluated by MRI T-spine on 03/26/2017- for metastatic disease or fracture - Currently comfortable.     Severe protein calorie malnutrition -Calorie count in progress -If patient not consuming adequate calories will start on appetite stimulant: Megace? -Adjust diet per nutrition    Iron deficiency anemia/folate deficiency anemia - Transfuse 2 units PRBC   Pressure injury of sacrum -Care per WOC protocol   Hematuria and urinary retention Foley cath to remain in place        DVT prophylaxis: Xarelto Code Status: DNR Family Communication: At bedside for discussion of plan of care Disposition Plan: TBD   Consultants:  Cardiology     Procedures/Significant Events:  5/10 echocardiogram:Left ventricle:/Incomplete study secondary to patient's discomfort.  The cavity size was normal. Wall thickness was   increased in a pattern of moderate LVH. Systolic function was   normal. The estimated ejection fraction was in the range of 60%   to 65%. Wall motion was normal; there were no regional wall   motion abnormalities. The study is not technically sufficient to   allow evaluation of LV diastolic function. - Mitral valve: Calcified annulus. Mildly thickened leaflets .   There was mild regurgitation. - Left atrium: Severely dilated. - Tricuspid valve: Mildly thickened leaflets. There was trivial   regurgitation.   ------------------------------------------------------------------------------------------------------------------------  5/17 d/c from [facility] 5/18 admit to [facility]  5/18 CT angiogram PE  protocol:-Negative PE - Emphysema -Worsening dependent airspace disease and pleural effusions throughout all lobes, favored to represent sequela of aspiration and aspiration pneumonia/pneumonitis.  -evidence of partial loculation of right-sided pleural fluid, potentially representing developing empyema versus complex pleural fluid. -Reactive mediastinal lymphadenopathy. Partially calcified lymph nodes of the mediastinum, compatible with prior granulomatous disease. 5/22 PCXR:-RIGHT chest wall port.-Changes of prior granulomatous disease with multiple calcified lymph nodes are again seen. Diffuse airspace opacity is again identified RIGHT>>LEFT stable - Bilateral pleural effusion     I have personally reviewed and interpreted all radiology studies and my findings are as above.  VENTILATOR SETTINGS:    Cultures   Antimicrobials: Anti-infectives (From admission, onward)   Start     Stop   09/28/17 1000  acyclovir (ZOVIRAX) tablet 400 mg         09/27/17 2300  vancomycin (VANCOCIN) IVPB 750 mg/150 ml premix  Status:  Discontinued     09/27/17 1122   09/27/17 2300  vancomycin (VANCOCIN) 500 mg in sodium chloride 0.9 % 100 mL IVPB  Status:  Discontinued     09/28/17 1529   09/27/17 1800  ceFEPIme (MAXIPIME) 1 g in sodium chloride 0.9 % 100 mL IVPB  Status:  Discontinued     09/28/17 1529   09/27/17 0945  vancomycin (VANCOCIN) IVPB 1000 mg/200 mL premix     09/27/17 1230   09/27/17 0945  ceFEPIme (MAXIPIME) 2 g in sodium chloride 0.9 % 100 mL IVPB  09/27/17 1111       Devices    LINES / TUBES:      Continuous Infusions:   Objective: Vitals:   10/01/17 2100 10/01/17 2300 10/02/17 0400 10/02/17 0727  BP: (!) 81/61 (!) 83/56 (!) 87/61 (!) 77/59  Pulse: 73 73 71 74  Resp: (!) _0 Temp:  97.6 F (36.4 C) 97.7 F (36.5 C) 97.9 F (36.6 C)  TempSrc:  Oral Oral Oral  SpO2: (!) 82% 91% 95% 93%  Weight:      Height:        Intake/Output Summary (Last 24  hours) at 10/02/2017 0743 Last data filed at 10/02/2017 3428 Gross per 24 hour  Intake 400 ml  Output 900 ml  Net -500 ml   Filed Weights   09/28/17 0443 09/29/17 0500 09/30/17 0500  Weight: 138 lb 12.8 oz (63 kg) 138 lb 14.2 oz (63 kg) 143 lb 8.3 oz (65.1 kg)    Physical Exam:  General: A/O x 4 , Acute on Chronic respiratory distress, cachectic Neck:  Negative scars, masses, torticollis, lymphadenopathy, JVD Lungs: decreased breath sounds diffusely, LEFT>>> RIGHT.  Positive rhonchi, absent breath sounds LLL, negative  wheezes or crackles Cardiovascular: Irregular regular rhythm and rate, negative murmur, gallop.  Negative rub, normal S1 and S2 Abdomen: negative abdominal pain, nondistended, positive soft, bowel sounds, no rebound, no ascites, no appreciable mass Extremities: No significant cyanosis, clubbing, or edema bilateral lower extremities Skin: Negative rashes, lesions, ulcers Psychiatric:  Negative depression, negative anxiety, negative fatigue, negative mania  Central nervous system:  Cranial nerves II through XII intact, tongue/uvula midline, all extremities muscle strength 5/5, sensation intact throughout, negative dysarthria, negative expressive aphasia, negative receptive aphasia.   .     Data Reviewed: Care during the described time interval was provided by me .  I have reviewed this patient's available data, including medical history, events of note, physical examination, and all test results as part of my evaluation.   CBC: Recent Labs  Lab 09/26/17 0455 09/27/17 0917 09/29/17 0701 09/30/17 0552 10/01/17 0538 10/02/17 0420  WBC 5.3 5.2 5.1 6.3 7.0 7.0  NEUTROABS 4.9 4.6  --   --   --   --   HGB 9.3* 9.1* 12.2* 11.3* 11.6* 11.6*  HCT 28.7* 28.4* 37.3* 35.5* 36.8* 37.5*  MCV 102.9* 101.8* 95.9 98.1 100.0 100.0  PLT 228 186 145* 160 161 768   Basic Metabolic Panel: Recent Labs  Lab 09/27/17 0917 09/29/17 0701 09/30/17 0552 10/01/17 0538 10/02/17 0420    NA 137 137 140 138 139  K 4.9 5.4* 4.3 4.5 4.7  CL 94* 97* 99* 99* 99*  CO2 32 30 35* 32 33*  GLUCOSE 98 85 99 92 104*  BUN 36* 29* 30* 30* 35*  CREATININE 1.01 0.94 1.03 1.01 1.05  CALCIUM 7.9* 7.9* 7.8* 7.9* 8.0*  MG  --   --  1.7  --  1.6*   GFR: Estimated Creatinine Clearance: 56.8 mL/min (by C-G formula based on SCr of 1.05 mg/dL). Liver Function Tests: Recent Labs  Lab 09/27/17 0917 09/29/17 0701 09/30/17 0552  AST _1 ALT 11* 9* 11*  ALKPHOS 225* 240* 258*  BILITOT 0.5 0.9 0.5  PROT 4.0* 3.9* 4.0*  ALBUMIN 1.6* 1.7* 1.6*   No results for input(s): LIPASE, AMYLASE in the last 168 hours. No results for input(s): AMMONIA in the last 168 hours. Coagulation Profile: No results for input(s): INR, PROTIME in the last 168  hours. Cardiac Enzymes: Recent Labs  Lab 09/27/17 0917  TROPONINI 0.09*   BNP (last 3 results) No results for input(s): PROBNP in the last 8760 hours. HbA1C: No results for input(s): HGBA1C in the last 72 hours. CBG: No results for input(s): GLUCAP in the last 168 hours. Lipid Profile: No results for input(s): CHOL, HDL, LDLCALC, TRIG, CHOLHDL, LDLDIRECT in the last 72 hours. Thyroid Function Tests: No results for input(s): TSH, T4TOTAL, FREET4, T3FREE, THYROIDAB in the last 72 hours. Anemia Panel: No results for input(s): VITAMINB12, FOLATE, FERRITIN, TIBC, IRON, RETICCTPCT in the last 72 hours. Urine analysis:    Component Value Date/Time   COLORURINE YELLOW 09/23/2017 0930   APPEARANCEUR HAZY (A) 09/23/2017 0930   LABSPEC 1.009 09/23/2017 0930   PHURINE 5.0 09/23/2017 0930   GLUCOSEU NEGATIVE 09/23/2017 0930   HGBUR LARGE (A) 09/23/2017 0930   BILIRUBINUR NEGATIVE 09/23/2017 0930   KETONESUR NEGATIVE 09/23/2017 0930   PROTEINUR 30 (A) 09/23/2017 0930   NITRITE NEGATIVE 09/23/2017 0930   LEUKOCYTESUR NEGATIVE 09/23/2017 0930   Sepsis Labs: _0 (procalcitonin:4,lacticidven:4)  ) Recent Results (from the past 240  hour(s))  Body fluid culture     Status: None   Collection Time: 09/22/17  2:41 PM  Result Value Ref Range Status   Specimen Description   Final    PLEURAL LEFT Performed at [facility], [place] [address] [place], [place] [address]    Special Requests   Final    NONE Performed at [facility], [place] [address] [place], [place] [address]    Gram Stain   Final    RARE WBC PRESENT, PREDOMINANTLY PMN NO ORGANISMS SEEN    Culture   Final    NO GROWTH 3 DAYS Performed at [place] [address], [place], [place] [address]    Report Status 09/26/2017 FINAL  Final  Blood culture (routine x 2)     Status: None (Preliminary result)   Collection Time: 09/27/17  9:52 AM  Result Value Ref Range Status   Specimen Description BLOOD RIGHT CHEST  Final   Special Requests   Final    BOTTLES DRAWN AEROBIC AND ANAEROBIC Blood Culture results may not be optimal due to an excessive volume of blood received in culture bottles   Culture   Final    NO GROWTH 4 DAYS Performed at [facility], [place] [address] [place], [place] [address]    Report Status PENDING  Incomplete  Blood culture (routine x 2)     Status: None (Preliminary result)   Collection Time: 09/27/17 10:30 AM  Result Value Ref Range Status   Specimen Description BLOOD RIGHT HAND  Final   Special Requests   Final    BOTTLES DRAWN AEROBIC AND ANAEROBIC Blood Culture results may not be optimal due to an inadequate volume of blood received in culture bottles   Culture   Final    NO GROWTH 4 DAYS Performed at [facility], [place] [address], [place], [place] [address]    Report Status PENDING  Incomplete  MRSA PCR Screening     Status: None   Collection Time: 09/28/17  9:04 AM  Result Value Ref Range Status   MRSA by PCR NEGATIVE NEGATIVE Final    Comment:        The GeneXpert MRSA Assay (FDA approved for NASAL specimens only), is one component of a comprehensive MRSA  colonization surveillance program. It is not intended to diagnose MRSA  infection nor to guide or monitor treatment for MRSA infections. Performed at [facility], [place] [address], [place], [name] [address]          Radiology Studies: Dg Chest Port 1 View  Result Date: 10/01/2017 CLINICAL DATA:  Follow-up pleural effusion EXAM: PORTABLE CHEST 1 VIEW COMPARISON:  09/27/2017 FINDINGS: Cardiac shadow remains enlarged. Right chest wall port is again seen and stable. Changes of prior granulomatous disease with multiple calcified lymph nodes are again seen. Diffuse airspace opacity is again identified bilaterally right greater than left overall stable from the prior exam. Bilateral pleural effusions are noted. IMPRESSION: No change from the prior exam. Electronically Signed   By: Inez Catalina M.D.   On: 10/01/2017 07:03        Scheduled Meds: . acyclovir  400 mg Oral Daily  . folic acid  1 mg Oral Daily  . furosemide  40 mg Oral BID  . miconazole nitrate  1 application Topical BID  . midodrine  10 mg Oral TID WC  . pregabalin  50 mg Oral BID  . rivaroxaban  20 mg Oral Daily  . senna-docusate  1 tablet Oral BID   Continuous Infusions:   LOS: 5 days    Time spent: 40 minutes    Sherece Gambrill, Geraldo Docker, MD [facility] Pager [phone]   If 7PM-7AM, please contact night-coverage [email or web address] Password [facility] 10/02/2017, 7:43 AM

## 2017-10-02 NOTE — Progress Notes (Signed)
Progress Note  Patient Name: [name] Date of Encounter: 10/02/2017  Primary Cardiologist: [name], MD   Subjective   No new complaints today. Still with some SOB. Denies chest pain or palpitations.   Inpatient Medications    Scheduled Meds: . acyclovir  400 mg Oral Daily  . folic acid  1 mg Oral Daily  . furosemide  40 mg Oral BID  . miconazole nitrate  1 application Topical BID  . midodrine  10 mg Oral TID WC  . pregabalin  50 mg Oral BID  . rivaroxaban  20 mg Oral Daily  . senna-docusate  1 tablet Oral BID   Continuous Infusions:  PRN Meds: acetaminophen **OR** [DISCONTINUED] acetaminophen, albuterol, morphine, MUSCLE RUB, ondansetron **OR** ondansetron (ZOFRAN) IV, prochlorperazine, sodium chloride   Vital Signs    Vitals:   10/01/17 2300 10/02/17 0400 10/02/17 0727 10/02/17 1147  BP: (!) 83/56 (!) 87/61 (!) 77/59 92/61  Pulse: 73 71 74 61  Resp: _0 Temp: 97.6 F (36.4 C) 97.7 F (36.5 C) 97.9 F (36.6 C) 97.9 F (36.6 C)  TempSrc: Oral Oral Oral Oral  SpO2: 91% 95% 93% 91%  Weight:      Height:        Intake/Output Summary (Last 24 hours) at 10/02/2017 1241 Last data filed at 10/02/2017 1314 Gross per 24 hour  Intake 200 ml  Output 900 ml  Net -700 ml   Filed Weights   09/28/17 0443 09/29/17 0500 09/30/17 0500  Weight: 138 lb 12.8 oz (63 kg) 138 lb 14.2 oz (63 kg) 143 lb 8.3 oz (65.1 kg)    Telemetry    Atrial fibrillation with normal ventricular rate; occasional PVCs - Personally Reviewed  Physical Exam   GEN: Frail elderly gentleman laying in bed in no acute distress.   Neck: No JVD, no carotid bruits Cardiac: IRRR, no murmurs, rubs, or gallops.  Respiratory: diffuse rhonchi with crackles at bases GI: NABS, Soft, nontender, non-distended  MS: 2-3+ b/l ankle/pedal edema; 2+ b/l wrist edema; No deformity. Neuro:  Nonfocal, moving all extremities spontaneously Psych: Normal affect   Labs    Chemistry Recent Labs    Lab 09/27/17 0917 09/29/17 0701 09/30/17 0552 10/01/17 0538 10/02/17 0420  NA 137 137 140 138 139  K 4.9 5.4* 4.3 4.5 4.7  CL 94* 97* 99* 99* 99*  CO2 32 30 35* 32 33*  GLUCOSE 98 85 99 92 104*  BUN 36* 29* 30* 30* 35*  CREATININE 1.01 0.94 1.03 1.01 1.05  CALCIUM 7.9* 7.9* 7.8* 7.9* 8.0*  PROT 4.0* 3.9* 4.0*  --   --   ALBUMIN 1.6* 1.7* 1.6*  --   --   AST _1 --   --   ALT 11* 9* 11*  --   --   ALKPHOS 225* 240* 258*  --   --   BILITOT 0.5 0.9 0.5  --   --   GFRNONAA >60 >60 >60 >60 >60  GFRAA >60 >60 >60 >60 >60  ANIONGAP _2 Hematology Recent Labs  Lab 09/30/17 0552 10/01/17 0538 10/02/17 0420  WBC 6.3 7.0 7.0  RBC 3.62* 3.68* 3.75*  HGB 11.3* 11.6* 11.6*  HCT 35.5* 36.8* 37.5*  MCV 98.1 100.0 100.0  MCH 31.2 31.5 30.9  MCHC 31.8 31.5 30.9  RDW 17.4* 17.0* 17.0*  PLT 160 161 161    Cardiac Enzymes Recent Labs  Lab  09/27/17 0917  TROPONINI 0.09*   No results for input(s): TROPIPOC in the last 168 hours.   BNP Recent Labs  Lab 09/27/17 0918  BNP 1,404.8*     DDimer No results for input(s): DDIMER in the last 168 hours.   Radiology    Dg Chest Port 1 View  Result Date: 10/01/2017 CLINICAL DATA:  Follow-up pleural effusion EXAM: PORTABLE CHEST 1 VIEW COMPARISON:  09/27/2017 FINDINGS: Cardiac shadow remains enlarged. Right chest wall port is again seen and stable. Changes of prior granulomatous disease with multiple calcified lymph nodes are again seen. Diffuse airspace opacity is again identified bilaterally right greater than left overall stable from the prior exam. Bilateral pleural effusions are noted. IMPRESSION: No change from the prior exam. Electronically Signed   By: Inez Catalina M.D.   On: 10/01/2017 07:03    Cardiac Studies   Echocardiogram 09/19/17: Study Conclusions  - Procedure narrative: The echo was ended before completing the   protocol due to the patients pain level due to chemo and   radiation. - Left  ventricle: The cavity size was normal. Wall thickness was   increased in a pattern of moderate LVH. Systolic function was   normal. The estimated ejection fraction was in the range of 60%   to 65%. Wall motion was normal; there were no regional wall   motion abnormalities. The study is not technically sufficient to   allow evaluation of LV diastolic function. - Mitral valve: Calcified annulus. Mildly thickened leaflets .   There was mild regurgitation. - Left atrium: Severely dilated. - Tricuspid valve: Mildly thickened leaflets. There was trivial   regurgitation.  Impressions:  - Incomplete study -discontinued due to patient discomfort. LVEF   60-65%, moderate LVH, mild MR, severe LAE, apperas in a-fib.   There is a prominent left pleural effusion  Patient Profile     72 y.o. male with PMH of chronic diastolic CHF, permanent atrial fibrillation, prostate cancer, COPD, lymphoplasmacytic lymphoma with ongoing chemotherapy, and chronic debilitation who is being followed by cardiology for acute on chronic CHF.   Assessment & Plan    1. Acute on chronic diastolic CHF: volume overload/anasarca is likely 2/2 low oncotic pressure as his albumin is 1.6. Home PO lasix initially held on admission due to hypotension; restarted 10/01/17 with net -120m in the last 24 hours; net -1.4L this admission. CXR yesterday with continue b/l pleural effusion and diffuse airspace opacities. - Continue home po lasix as BP allows.  - Continue to monitor strict I&Os and daily weights - Agree with aggressive pulmonary toileting - Will add compression stockings   2. Concern for cardiac AL amyloidosis:  Suspicion noted on previous admission and he was recommended to follow-up with Dr. MAundra Dubinoutpatient.  - Suspect treatment options of possible amyloidosis will be limited given current debilitated state  3. Hypotension: has baseline orthostatic hypotension; BP remains low but near baseline - Continue midodrine    - Will add compression stockings  4. Permanent atrial fibrillation: rate well controlled this admission without AV nodal blocking agents - Continue Xarelto for CHADS2VASC score of 2 (CHF and age >>89  557 Onc: Lymphoplasmacytic lymphoma and MGUS - follows with Dr. KIrene Limbo Currently undergoing chemotherapy with partial response. Patient continues to believe he is receiving curative chemotherapy.  - Continue management per primary team    Agree with ongoing palliative care efforts for continued GBaldwindiscussion. Patients overall prognosis is poor and diuretic efforts limited by low albumin and hypotension.  For questions or updates, please contact [place] Please consult [email or web address] for contact info under Cardiology/STEMI.      Signed, Abigail Butts, PA-C  10/02/2017, 12:41 PM   [phone]

## 2017-10-03 DIAGNOSIS — I509 Heart failure, unspecified: Secondary | ICD-10-CM

## 2017-10-03 LAB — BASIC METABOLIC PANEL
Anion gap: 8 (ref 5–15)
BUN: 41 mg/dL — AB (ref 6–20)
CO2: 34 mmol/L — ABNORMAL HIGH (ref 22–32)
CREATININE: 1.05 mg/dL (ref 0.61–1.24)
Calcium: 8.2 mg/dL — ABNORMAL LOW (ref 8.9–10.3)
Chloride: 100 mmol/L — ABNORMAL LOW (ref 101–111)
Glucose, Bld: 118 mg/dL — ABNORMAL HIGH (ref 65–99)
Potassium: 4.9 mmol/L (ref 3.5–5.1)
SODIUM: 142 mmol/L (ref 135–145)

## 2017-10-03 LAB — CBC
HCT: 38.2 % — ABNORMAL LOW (ref 39.0–52.0)
HEMOGLOBIN: 12 g/dL — AB (ref 13.0–17.0)
MCH: 31.7 pg (ref 26.0–34.0)
MCHC: 31.4 g/dL (ref 30.0–36.0)
MCV: 100.8 fL — ABNORMAL HIGH (ref 78.0–100.0)
PLATELETS: 180 10*3/uL (ref 150–400)
RBC: 3.79 MIL/uL — ABNORMAL LOW (ref 4.22–5.81)
RDW: 16.9 % — AB (ref 11.5–15.5)
WBC: 8.2 10*3/uL (ref 4.0–10.5)

## 2017-10-03 LAB — MAGNESIUM: MAGNESIUM: 1.6 mg/dL — AB (ref 1.7–2.4)

## 2017-10-03 MED ORDER — ALBUMIN HUMAN 25 % IV SOLN
50.0000 g | Freq: Once | INTRAVENOUS | Status: AC
  Administered 2017-10-03: 50 g via INTRAVENOUS
  Filled 2017-10-03: qty 50

## 2017-10-03 MED ORDER — IPRATROPIUM-ALBUTEROL 0.5-2.5 (3) MG/3ML IN SOLN: 3.0000 mL | Freq: Four times a day (QID) | RESPIRATORY_TRACT | Status: DC | PRN

## 2017-10-03 MED ORDER — TRAMADOL HCL 50 MG PO TABS
50.0000 mg | ORAL_TABLET | Freq: Once | ORAL | Status: AC
  Administered 2017-10-03: 50 mg via ORAL
  Filled 2017-10-03: qty 1

## 2017-10-03 MED ORDER — ALBUMIN HUMAN 25 % IV SOLN
37.5000 g | Freq: Once | INTRAVENOUS | Status: AC
  Administered 2017-10-03: 37.5 g via INTRAVENOUS
  Filled 2017-10-03: qty 150

## 2017-10-03 NOTE — Progress Notes (Signed)
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   Clinical Impression   [facility]A to [facility], pt mobilizing with use of RW and wife assisted with ADL as needed. After DC from [facility], wife states pt did not get out of bed before returning to the hospital. Session limited to bed level due to low BP. HOB positioned @ 45 degrees with BP84/66. Subsequent BP: 73/42; 75/51; 76/43; SpO2 92 6L. Required Max A +2 with rolling and to clean pt and change Mepalex dressing over small sacral wound. Rolling is painful for pt. Pt/wife agreeable to SNF. Will follow acutely and mobilize when medically stable.     Follow Up Recommendations  SNF;Supervision/Assistance - 24 hour    Equipment Recommendations  None recommended by OT    Recommendations for Other Services       Precautions / Restrictions Precautions Precautions: Fall Precaution Comments: significant c/o back bain, monitor VS, BP low; sacral wound Restrictions Weight Bearing Restrictions: No      Mobility Bed Mobility Overal bed mobility: Needs Assistance Bed Mobility: Rolling Rolling: Max assist;+2 for physical assistance         General bed mobility comments: Pt reaching for rail and able to initiate movement but required Max A to roll onto side; painful movement  Transfers                 General transfer comment: Unable to perform due to BP    Balance Overall balance assessment: Needs assistance Sitting-balance support: No upper extremity  supported;Feet supported Sitting balance-Leahy Scale: Fair (bed level)                                     ADL either performed or assessed with clinical judgement   ADL Overall ADL's : Needs assistance/impaired Eating/Feeding: Moderate assistance Eating/Feeding Details (indicate cue type and reason): wife states sheis assisting pt with feeding himself Grooming: Bed level;Maximal assistance   Upper Body Bathing: Maximal assistance;Bed level   Lower Body Bathing: Total assistance;+2 for physical assistance;Bed level   Upper Body Dressing : Maximal assistance;Bed level;Sitting   Lower Body Dressing: Total assistance;+2 for physical assistance;Bed level       Toileting- Clothing Manipulation and Hygiene: Total assistance;+2 for physical assistance;Bed level(+2 for rolling)         General ADL Comments: Pt very painful when rolling for pericare and to change sacral Mepalex; after cleaning pt, he began having more stool - nsg notified     Vision Baseline Vision/History: Wears glasses Patient Visual Report: No change from baseline       Perception     Praxis      Pertinent Vitals/Pain Pain Assessment: 0-10 Pain Score: 8  Pain Location: back; butt Pain Descriptors / Indicators: Discomfort;Grimacing;Moaning Pain Intervention(s): Limited activity within patient's tolerance;Repositioned     Hand Dominance Right   Extremity/Trunk Assessment Upper Extremity Assessment Upper Extremity Assessment: Generalized weakness(Able to complete hand to mouth/head BUE)   Lower Extremity Assessment Lower  Extremity Assessment: Defer to PT evaluation   Cervical / Trunk Assessment Cervical / Trunk Assessment: Kyphotic   Communication Communication Communication: No difficulties   Cognition Arousal/Alertness: Lethargic Behavior During Therapy: Flat affect Overall Cognitive Status: Difficult to assess    Slow processing Eyes closed majority of session                              General Comments: AxO x 3; wife answering most questions   General Comments       Exercises Shoulder Exercises Shoulder Flexion: AROM;10 reps;Supine(3 sets) Elbow Flexion: AROM;Both;5 reps;Supine Elbow Extension: AROM;Both;5 reps;Supine   Shoulder Instructions      Home Living Family/patient expects to be discharged to:: Skilled nursing facility Living Arrangements: Spouse/significant other Available Help at Discharge: Family Type of Home: House Home Access: Stairs to enter [employer] of Steps: 5 Entrance Stairs-Rails: Left Home Layout: One level     Bathroom Shower/Tub: Tub/shower unit;Curtain   Biochemist, clinical: Standard     Home Equipment: Environmental consultant - 2 wheels;Bedside commode;Shower seat;Wheelchair - manual   Additional Comments: Wife reports it was taking 3 people to get him in and out of the house when he could walk.      Prior Functioning/Environment Level of Independence: Needs assistance  Gait / Transfers Assistance Needed: Amb with RW and ambulating household distance with wife S/SBA      Comments: pt had assistance with adls.  Has someone come in once a week to help him wash up and wife assisted with dressing. After [facility], pt did not get OOB during his 12 hr at [facility].        OT Problem List: Decreased strength;Decreased range of motion;Decreased activity tolerance;Impaired balance (sitting and/or standing);Decreased knowledge of use of DME or AE;Pain;Impaired UE functional use;Decreased safety awareness;Cardiopulmonary status limiting activity;Increased edema      OT Treatment/Interventions: Self-care/ADL training;Therapeutic exercise;Energy conservation;DME and/or AE instruction;Therapeutic activities;Patient/family education;Balance training    OT Goals(Current goals can be found in the care plan section) Acute Rehab OT Goals Patient Stated Goal: per wife, to walk and play piano OT Goal Formulation: With  patient/family Time For Goal Achievement: 10/17/17 Potential to Achieve Goals: Fair  OT Frequency: Min 2X/week   Barriers to D/C:            Co-evaluation PT/OT/SLP Co-Evaluation/Treatment: Yes Reason for Co-Treatment: Complexity of the patient's impairments (multi-system involvement)   OT goals addressed during session: ADL's and self-care      AM-PAC PT "6 Clicks" Daily Activity     Outcome Measure Help from another person eating meals?: A Lot Help from another person taking care of personal grooming?: A Lot Help from another person toileting, which includes using toliet, bedpan, or urinal?: Total Help from another person bathing (including washing, rinsing, drying)?: A Lot Help from another person to put on and taking off regular upper body clothing?: A Lot Help from another person to put on and taking off regular lower body clothing?: Total 6 Click Score: 10   End of Session Equipment Utilized During Treatment: Oxygen(6L) Nurse Communication: Mobility status;Other (comment)(BP)  Activity Tolerance: Patient limited by fatigue Patient left: in bed;with call bell/phone within reach;with nursing/sitter in room;with bed alarm set;with family/visitor present  OT Visit Diagnosis: Muscle weakness (generalized) (M62.81);Pain;Other abnormalities of gait and mobility (R26.89);Other symptoms and signs involving cognitive function Pain - part of body: (back/buttocks)  Time: 0830-0900 OT Time Calculation (min): 30 min Charges:  OT General Charges $OT Visit: 1 Visit OT Evaluation $OT Eval Moderate Complexity: 1 Mod G-Codes:     Maurie Boettcher, OT/L  OT Clinical Specialist [phone]   [facility] 10/03/2017, 9:30 AM

## 2017-10-03 NOTE — Evaluation (Signed)
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  Clinical Impression  Per wife prior to last hospitalization at [facility] pt was able to ambulate household level distances using RW and had assist from wife and aide in ADLs. Pt currently unable to participate in mobility secondary to hypotension of 76/45 and increased oxygen demand with SaO2 90% on 6L O2 via nasal cannula. Pt able to roll onto side with maxAx2 for visualization of Stage 2 pressure injury on sacrum and to provide pericare and replacement of soiled Allevyn dressing. Pt and wife are in agreement to SNF level rehab at d/c to regain strength and mobility. PT will continue to follow acutely to work toward mobility goals as pt is medically able.      Follow Up Recommendations SNF    Equipment Recommendations  None recommended by PT    Recommendations for Other Services       Precautions / Restrictions Precautions Precautions: Fall Precaution Comments: significant c/o back bain, monitor VS, BP low; sacral wound Restrictions Weight Bearing Restrictions: No      Mobility  Bed Mobility Overal bed mobility: Needs Assistance Bed Mobility: Rolling Rolling: Max assist;+2 for physical assistance         General bed mobility comments: Pt reaching for rail and able to initiate movement but required Max A to rol onto side; painful movement  Transfers                 General transfer comment: Unable to perform due to BP        Balance Overall  balance assessment: Needs assistance Sitting-balance support: No upper extremity supported;Feet supported Sitting balance-Leahy Scale: Fair                                       Pertinent Vitals/Pain Pain Assessment: 0-10 Pain Score: 8  Pain Location: back; butt Pain Descriptors / Indicators: Discomfort;Grimacing;Moaning Pain Intervention(s): Limited activity within patient's tolerance;Monitored during session;Repositioned    Home Living Family/patient expects to be discharged to:: Skilled nursing facility Living Arrangements: Spouse/significant other Available Help at Discharge: Family Type of Home: House Home Access: Stairs to enter Entrance Stairs-Rails: Left Entrance Stairs-Number of Steps: 5 Home Layout: One level Home Equipment: Walker - 2 wheels;Bedside commode;Shower seat;Wheelchair - manual Additional Comments: Wife reports it was taking 3 people to get him in and out of the house when he could walk.    Prior Function Level of Independence: Needs assistance   Gait / Transfers Assistance Needed: Amb with RW and ambulating household distance with wife S/SBA      Comments: pt had assistance with adls.  Has someone come in once a week to help him wash up and wife assisted with dressing. [facility], pt did not get OOB during his [facility] hr at [facility].     Hand Dominance   Dominant Hand: Right    Extremity/Trunk Assessment   Upper Extremity Assessment Upper Extremity Assessment: Defer to OT evaluation  Lower Extremity Assessment Lower Extremity Assessment: RLE deficits/detail;LLE deficits/detail RLE: Unable to fully assess due to pain LLE: Unable to fully assess due to pain    Cervical / Trunk Assessment Cervical / Trunk Assessment: Kyphotic  Communication   Communication: No difficulties  Cognition Arousal/Alertness: Lethargic Behavior During Therapy: Flat affect Overall Cognitive Status: Difficult to assess                                  General Comments: AxO x 3 following directions and conversive      General Comments General comments (skin integrity, edema, etc.): unable to perform mobility other than rolling to check on Stage 2 sacral pressure injury, secondary to hypotension, BP of 76/45 , Pt on 6L O2 via nasal cannula and SaO2 >90%O2 throughout session    Exercises Shoulder Exercises Shoulder Flexion: AROM;10 reps;Supine(3 sets) Elbow Flexion: AROM;Both;5 reps;Supine Elbow Extension: AROM;Both;5 reps;Supine   Assessment/Plan    PT Assessment Patient needs continued PT services  PT Problem List Decreased strength;Decreased range of motion;Decreased activity tolerance;Decreased balance;Decreased mobility;Cardiopulmonary status limiting activity;Pain       PT Treatment Interventions DME instruction;Therapeutic exercise;Gait training;Functional mobility training;Therapeutic activities;Patient/family education    PT Goals (Current goals can be found in the Care Plan section)  Acute Rehab PT Goals Patient Stated Goal: per wife, to walk and play piano PT Goal Formulation: With patient/family Time For Goal Achievement: 10/17/17 Potential to Achieve Goals: Poor    Frequency Min 2X/week     Co-evaluation   Reason for Co-Treatment: Complexity of the patient's impairments (multi-system involvement) PT goals addressed during session: Mobility/safety with mobility OT goals addressed during session: ADL's and self-care       AM-PAC PT "6 Clicks" Daily Activity  Outcome Measure Difficulty turning over in bed (including adjusting bedclothes, sheets and blankets)?: Unable Difficulty moving from lying on back to sitting on the side of the bed? : Unable Difficulty sitting down on and standing up from a chair with arms (e.g., wheelchair, bedside commode, etc,.)?: Unable Help needed moving to and from a bed to chair (including a wheelchair)?: Total Help needed walking in hospital room?: Total Help  needed climbing 3-5 steps with a railing? : Total 6 Click Score: 6    End of Session Equipment Utilized During Treatment: Oxygen Activity Tolerance: Treatment limited secondary to medical complications (Comment) Patient left: in bed;with call bell/phone within reach;with family/visitor present Nurse Communication: Mobility status;Other (comment)(need for pericare) PT Visit Diagnosis: Other abnormalities of gait and mobility (R26.89);Pain Pain - part of body: (sacrum, back)    Time: 2751-7001 PT Time Calculation (min) (ACUTE ONLY): 23 min   Charges:   PT Evaluation $PT Eval High Complexity: 1 High     PT G Codes:        Kenniel Bergsma B. Migdalia Dk PT, DPT Acute Rehabilitation  [phone] Pager [phone]    [place] 10/03/2017, 10:20 AM

## 2017-10-03 NOTE — NC FL2 (Signed)
[place] LEVEL OF CARE SCREENING TOOL     IDENTIFICATION  Patient Name: [name] Birthdate: 1946/02/05 Sex: male Admission Date (Current Location): 09/27/2017  [facility] and [place] Number:  Herbalist and Address:  [facility], [place] [address], [place], [place] [address]      Provider Number: [id number]  Attending Physician Name and Address:  Allie Bossier, MD  Relative Name and Phone Number:  [name], spouse, [phone]    Current Level of Care: Hospital Recommended Level of Care: [facility] Prior Approval Number:    Date Approved/Denied:   PASRR Number: [id number]  Discharge Plan: SNF    Current Diagnoses: Patient Active Problem List   Diagnosis Date Noted  . Acute on chronic congestive heart failure ([place])   . Failure to thrive in adult   . Lethargy   . Acute on chronic respiratory failure ([place]) 09/27/2017  . Anemia 09/27/2017  . Pressure injury of sacral region, stage 2 09/24/2017  . Pleural effusion on left   . Elevated troponin 09/19/2017  . Tinea cruris 09/19/2017  . Generalized weakness 09/19/2017  . Diastolic CHF ([facility]) 57/32/2025  . Port or reservoir infection 07/22/2017  . Port-A-Cath in place 07/22/2017  . Palliative care encounter   . Malnutrition of moderate degree 05/02/2017  . Pressure injury of skin 05/01/2017  . Syncope and collapse 04/30/2017  . Right-sided thoracic back pain 03/26/2017  . Insomnia 03/26/2017  . Lymphedema of left arm 03/26/2017  . 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 Counseling regarding advanced care planning and goals of care 03/24/2017  . Pain management contract signed 03/10/2017  . Chronic generalized pain disorder 03/04/2017  . Light chain (AL) amyloidosis (HCC)   . Nephrotic syndrome   . Proteinuria   . Dyspnea   . Left arm swelling   . Acute on chronic diastolic CHF (congestive heart failure) ([place])   . Elevated brain  natriuretic peptide (BNP) level   . Acute on chronic diastolic (congestive) heart failure ([place]) 02/21/2017  . Hypotension 02/21/2017  . Fluid overload 02/21/2017  . Mediastinal lymphadenopathy 02/10/2017  . Adjustment disorder with depressed mood 12/03/2016  . Bacteremia due to Streptococcus pneumoniae 11/19/2016  . Sepsis ([place]) 11/19/2016  . Hypoalbuminemia 11/19/2016  . Right leg pain 11/19/2016  . AKI (acute kidney injury) ([place]) 11/19/2016  . Acute urinary retention 11/19/2016  . Hyponatremia 11/18/2016  . Aspiration pneumonia ([place]) 11/18/2016  . HCAP (healthcare-associated pneumonia) 11/18/2016  . History of prostate cancer 11/15/2016  . Chronic post-operative pain 11/15/2016  . Persistent atrial fibrillation ([place])   . Acute on chronic combined systolic and diastolic CHF (congestive heart failure) ([place])   . Pulmonary HTN ([place])   . Dilated aortic root ([place])   . Septic shock ([place])   . Pedal edema   . Acute-on-chronic respiratory failure ([place]) 05/21/2016  . Lung mass 05/21/2016  . Loculated pleural effusion 05/21/2016  . Empyema ([place]) 05/21/2016  . S/P thoracentesis   . Bilateral pleural effusion 05/14/2016  . COPD mixed type ([place]) 05/14/2016  . Tobacco abuse 05/14/2016  . Protein-calorie malnutrition, severe ([place]) 05/14/2016    Orientation RESPIRATION BLADDER Height & Weight     Self, Time, Situation, Place  O2(Nasal cannula 6L) Continent, Indwelling catheter Weight: 67.2 kg (148 lb 3.2 oz) Height:  _0  (180.3 cm)  BEHAVIORAL SYMPTOMS/MOOD NEUROLOGICAL BOWEL NUTRITION STATUS      Continent Diet(Please see DC Summary)  AMBULATORY STATUS COMMUNICATION OF NEEDS  Skin   Extensive Assist Verbally PU Stage and Appropriate Care(Stage II on vertebral column; Stage I on coccyx)                       Personal Care Assistance Level of Assistance  Bathing, Dressing, Feeding Bathing Assistance: Maximum assistance Feeding assistance: Limited assistance Dressing Assistance:  Limited assistance     Functional Limitations Info  Sight, Hearing, Speech Sight Info: Impaired Hearing Info: Adequate Speech Info: Adequate    SPECIAL CARE FACTORS FREQUENCY  PT (By licensed PT), OT (By licensed OT)     PT Frequency: 5x/week OT Frequency: 3x/week            Contractures      Additional Factors Info  Code Status, Allergies Code Status Info: DNR Allergies Info: Demerol Meperidine, Oxycodone Hcl           Current Medications (10/03/2017):  This is the current hospital active medication list Current Facility-Administered Medications  Medication Dose Route Frequency Provider Last Rate Last Dose  . acetaminophen (TYLENOL) tablet 650 mg  650 mg Oral Q6H PRN Radene Gunning, NP      . acyclovir (ZOVIRAX) tablet 400 mg  400 mg Oral Daily Radene Gunning, NP   400 mg at 10/03/17 1006  . albumin human 25 % solution 37.5 g  37.5 g Intravenous Once Allie Bossier, MD      . dextromethorphan-guaiFENesin [facility] DM) 30-600 MG per 12 hr tablet 1 tablet  1 tablet Oral BID Allie Bossier, MD   1 tablet at 10/03/17 1007  . folic acid (FOLVITE) tablet 1 mg  1 mg Oral Daily Joette Catching T, MD   1 mg at 10/03/17 1006  . furosemide (LASIX) tablet 40 mg  40 mg Oral BID Cherene Altes, MD   40 mg at 10/03/17 1007  . ipratropium-albuterol (DUONEB) 0.5-2.5 (3) MG/3ML nebulizer solution 3 mL  3 mL Nebulization Q6H PRN Allie Bossier, MD      . miconazole nitrate (MICATIN) topical powder 1 application  1 application Topical BID Radene Gunning, NP   1 application at 16/38/46 1008  . midodrine (PROAMATINE) tablet 10 mg  10 mg Oral TID WC Radene Gunning, NP   10 mg at 10/03/17 1006  . morphine (MSIR) tablet 15 mg  15 mg Oral Q4H PRN Cherene Altes, MD   15 mg at 10/02/17 1823  . MUSCLE RUB CREA   Topical QID PRN Susa Raring, RPH      . ondansetron [facility]) tablet 4 mg  4 mg Oral Q6H PRN Radene Gunning, NP       Or  . ondansetron [facility]) injection 4 mg  4 mg  Intravenous Q6H PRN Black, Karen M, NP      . predniSONE (DELTASONE) tablet 50 mg  50 mg Oral Q breakfast Allie Bossier, MD   50 mg at 10/02/17 2253  . pregabalin (LYRICA) capsule 50 mg  50 mg Oral BID Cherene Altes, MD   50 mg at 10/03/17 1006  . prochlorperazine (COMPAZINE) tablet 10 mg  10 mg Oral Q6H PRN Radene Gunning, NP      . rivaroxaban [facility]) tablet 20 mg  20 mg Oral Daily Radene Gunning, NP   20 mg at 10/03/17 1006  . senna-docusate (Senokot-S) tablet 1 tablet  1 tablet Oral BID Cherene Altes, MD   1 tablet at 10/03/17 1007  . sodium  chloride (OCEAN) 0.65 % nasal spray 1 spray  1 spray Each Nare PRN Cherene Altes, MD         Discharge Medications: Please see discharge summary for a list of discharge medications.  Relevant Imaging Results:  Relevant Lab Results:   Additional Information SSN: [place], [place]

## 2017-10-03 NOTE — Clinical Social Work Note (Signed)
Clinical Social Work Assessment  Patient Details  Name: [name] MRN: [id number] Date of Birth: 1945-09-23  Date of referral:  10/03/17               Reason for consult:  Facility Placement, Family Concerns, Discharge Planning                Permission sought to share information with:  Family Supports Permission granted to share information::  Yes, Verbal Permission Granted  Name::     [name]  Agency::  snf  Relationship::  wife  Contact Information:  [phone]  Housing/Transportation Living arrangements for the past 2 months:  Single Family Home Source of Information:  Patient Patient Interpreter Needed:  None Criminal Activity/Legal Involvement Pertinent to Current Situation/Hospitalization:  No - Comment as needed Significant Relationships:  Adult Children, Spouse Lives with:  Self, Spouse Do you feel safe going back to the place where you live?  Yes Need for family participation in patient care:  Yes (Comment)  Care giving concerns: Clinical social worker met with patient and wife at bedside to discuss disposition plan. Patient was getting a manicure so wife stepped outside of the room to speak to [name]   Facilities manager / plan:  Wife stated she would like patient to discharge to a SNF to be able to get his strength back up. Wife stated she is not familiar with being in a facility so wife had some anxiety towards the process. CSW went over the process of transitioning to rehab. 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 Wife stated she would like  patient to go to [name] since it is close to home and she would be abel to walk to facility. CSW reached out to [facility] to see if they would be able to take patient, [name] stated they are not in network with patients insurance company therefor they are unable to offer bed. Wife made aware.  CSW gave wife a list of facilities that are able to take patient with [name] insurance. CSW informed patient that with the holiday coming [name] will need decision to be able to start authorization through insurance. Patients MD aware that family was given bed offers.     Employment status:  Retired Nurse, adult PT Recommendations:  [place] / Referral to community resources:  [place]  Patient/Family's Response to care: Wife very supportive of patient and his goals. Wife stated she is nervous because patient has never been to a facility before and she hopes he can gain his strength back up.  Patient/Family's Understanding of and Emotional Response to Diagnosis, Current Treatment, and Prognosis: Patients wife stated she will reach out to CSW to make her aware of the facility she chose  Emotional Assessment Appearance:  Appears stated age Attitude/Demeanor/Rapport:  Unable to Assess Affect (typically observed):  Unable to Assess Orientation:  Oriented to Self, Oriented to Place, Oriented to  Time, Oriented to Situation Alcohol / Substance use:  Not Applicable Psych involvement (Current and /or in the community):  No (Comment)  Discharge Needs  Concerns to be addressed:  Care Coordination Readmission within the last 30 days:  Yes Current discharge risk:  Physical Impairment Barriers to Discharge:  Continued Medical Work up, [name], LCSW 10/03/2017, 3:49 PM

## 2017-10-03 NOTE — Progress Notes (Signed)
Nutrition Follow Up  DOCUMENTATION CODES:   Non-severe (moderate) malnutrition in context of chronic illness  INTERVENTION:    D/C 48 hr calorie count  Magic cup po TID with meals, each supplement provides 290 kcal and 9 gm of protein  Boost Breeze po TID between meals, each supplement provides 250 kcal and 9 gm of protein  NUTRITION DIAGNOSIS:   Moderate Malnutrition related to chronic illness(malignant lymphoplasmayctic lymphoma, COPD, CHF) as evidenced by moderate fat depletion, severe fat depletion, ongoing  GOAL:   Patient will meet greater than or equal to 90% of their needs, progressing  MONITOR:   PO intake, Supplement acceptance, Weight trends, Labs, I & O's, Skin  ASSESSMENT:   72 y.o. 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   48 calorie count ordered 5/22. No meal tickets available.  Pt eating a cinnamon bun upon RD visit. Did not like his breakfast. Wife reports they brought pt a hamburger & macaroni and cheese last night.  Apparently he took one (1) bite of the macaroni & cheese and fell asleep.  He does enjoy the [employer] supplements. [place] Boost Breeze is tolerable but he doesn't particularly like it. Labs and medications reviewed. Mg 1.6 (L).  Pt and wife are interested in pt receiving a probiotic and prenatal vitamin.  Diet Order:    Diet Order           Diet regular Room service appropriate? Yes; Fluid consistency: Thin  Diet effective now         EDUCATION NEEDS:   No education needs have been identified at this time  Skin:  Skin Assessment: Skin Integrity Issues: Skin Integrity Issues:: Stage I, Stage II Stage I:  coccyx Stage II: sacrum  Last BM:  5/22   Intake/Output Summary (Last 24  hours) at 10/03/2017 1113 Last data filed at 10/03/2017 0355 Gross per 24 hour  Intake 480 ml  Output 1150 ml  Net -670 ml   Height:   Ht Readings from Last 1 Encounters:  09/28/17 _0  (1.803 m)   Weight:   Wt Readings from Last 1 Encounters:  10/03/17 148 lb 3.2 oz (67.2 kg)   Ideal Body Weight:  67.2 kg  BMI:  Body mass index is 20.67 kg/m.  Estimated Nutritional Needs:   Kcal:  2000-2200  Protein:  105-115 gm  Fluid:  2.0-2.2 L  Arthur Holms, RD, LDN Pager #: [phone] After-Hours Pager #: [phone]

## 2017-10-03 NOTE — Progress Notes (Signed)
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    Patient was discharged from [facility] the evening prior to his return to the ED. During that admission he diuresed 7 L. He was sent home on 4L [place], increased from his prior 3L.        Subjective: 5/24 A/O x4, positive acute on chronic S OB, negative CP, negative abdominal pain.     Assessment & Plan:   Principal Problem:   Acute-on-chronic respiratory failure (HCC) Active Problems:   Bilateral pleural effusion   COPD mixed type (HCC)   Protein-calorie malnutrition, severe (HCC)   Loculated pleural effusion   Empyema (HCC)   Persistent atrial fibrillation (HCC)   Acute on chronic combined systolic and diastolic CHF (congestive heart failure) (HCC)   Pulmonary HTN (HCC)   Aspiration pneumonia (HCC)   Hypoalbuminemia   Hypotension   Elevated brain natriuretic peptide (BNP) level   Nephrotic syndrome   Elevated troponin   Anemia   Lethargy   Failure to thrive in adult   Acute on chronic congestive heart failure (HCC)  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 Brunetta Genera - Per patient and wife completed chemotherapy ( Bendamustine/Rituxan).  Has completed 6 cycles (last cycle in April): Partial response PER oncology note.  -Extensive conversation with patient and wife explaining that amyloidosis is not reversible, although chemotherapy may slow further deposition.  IgG kappa MGUS  -Per Dr. Grier Mitts oncology note patient  also diagnosed with MGUS.  No overt evidence of cardiac involvement based on ECHO   Anasarca - Secondary to low oncotic state.  Albumin 1.6. - 5/24 Albumin 50 g x 1 -Cannot aggressively diuresis as this will lead to hypoperfusion and worsening of endorgan function. -See CHF   Hypothermia -Negative indication of infection, most likely secondary to poor nutritional status.   -Continue bear hugger PRN   Chronic diastolic CHF -Believe secondary to cardiac AL amyloidosis.  No new suggestions per cardiology -Strict in and out since admission -2.9 L - Daily weight Filed Weights   09/29/17 0500 09/30/17 0500 10/03/17 0347  Weight: 138 lb 14.2 oz (63 kg) 143 lb 8.3 oz (65.1 kg) 148 lb 3.2 oz (67.2 kg)  -Lasix 40 mg BID - Diuretic strategy will be based on patient's weight.  Will need to adjust Lasix dosing dependent on daily weight.  Dr. Thereasa Solo addressed this issue with patient and wife however not sure they will be able to follow this complicated regimen.  Will most likely need SNF placement or daily home health aide to assist them - Follow-up with Dr. Aundra Dubin as outpatient  Chronic hypotension - Midodrine 10 mg TID  Persistent atrial fibrillation -History of failed DCCV -Currently rate controlled history of failed DCCV  -On Xarelto for anticoagulation  Acute on chronic respiratory failure with hypoxia/Emphysema -Multifactorial severe emphysema, bilateral pleural effusion (developing empyema RIGHT?),  Pneumonia? -Continue to treat underlying problems, mild improvement. - 5/23 prednisone 50 mg daily (short course 3 to 5 days) -Flutter valve -DuoNeb QID - Mucinex DM - Counseled  patient and wife that we will start physical therapy vest if patient continues to have problems clearing lung secretions.  Chronic Bilateral pleural Effusion/ RIGHT Empyema -Patient seen by Dr. Tharon Aquas Trigt cardiothoracic surgery 12/11/2016 as outpatient follow-up for RIGHT sided VATS procedure.  At that time  no further surgical intervention required. -5/13 S/P thoracentesis (previous admission) - LEFT pleural effusion appears >> RIGHT effusion.  COPD  -By CT angiogram severe COPD - See respiratory failure   Nephrotic syndrome (by biopsy) -Secondary to AL amyloidosis - see discussion above  Recent Labs  Lab 09/27/17 0917 09/29/17 0701 09/30/17 0552 10/01/17 0538 10/02/17 0420 10/03/17 0517  CREATININE 1.01 0.94 1.03 1.01 1.05 1.05   Chronic pain -Chronic back pain evaluated by MRI T-spine on 03/26/2017- for metastatic disease or fracture - Currently comfortable.    Severe protein calorie malnutrition -Calorie count in progress -If patient not consuming adequate calories will start on appetite stimulant: Megace? -Adjust diet per nutrition    Iron deficiency anemia/folate deficiency anemia - Transfuse 2 units PRBC   Pressure injury of sacrum -Care per WOC protocol   Hematuria and urinary retention Foley cath to remain in place        DVT prophylaxis: Xarelto Code Status: DNR Family Communication: At bedside for discussion of plan of care Disposition Plan: TBD   Consultants:  Cardiology     Procedures/Significant Events:  5/10 echocardiogram:Left ventricle:/Incomplete study secondary to patient's discomfort.  The cavity size was normal. Wall thickness was   increased in a pattern of moderate LVH. Systolic function was   normal. The estimated ejection fraction was in the range of 60%   to 65%. Wall motion was normal; there were no regional wall   motion abnormalities. The study is not technically sufficient to   allow evaluation of LV diastolic function. - Mitral valve: Calcified annulus. Mildly thickened leaflets .   There was mild regurgitation. - Left atrium: Severely dilated. - Tricuspid valve: Mildly thickened leaflets. There was trivial    regurgitation.  ------------------------------------------------------------------------------------------------------------------------  5/17 d/c from [facility] 5/18 admit to [facility]  5/18 CT angiogram PE protocol:-Negative PE - Emphysema -Worsening dependent airspace disease and pleural effusions throughout all lobes, favored to represent sequela of aspiration and aspiration pneumonia/pneumonitis.  -evidence of partial loculation of right-sided pleural fluid, potentially representing developing empyema versus complex pleural fluid. -Reactive mediastinal lymphadenopathy. Partially calcified lymph nodes of the mediastinum, compatible with prior granulomatous disease. 5/22 PCXR:-RIGHT chest wall port.-Changes of prior granulomatous disease with multiple calcified lymph nodes are again seen. Diffuse airspace opacity is again identified RIGHT>>LEFT stable - Bilateral pleural effusion     I have personally reviewed and interpreted all radiology studies and my findings are as above.  VENTILATOR SETTINGS:    Cultures   Antimicrobials: Anti-infectives (From admission, onward)   Start     Stop   09/28/17 1000  acyclovir (ZOVIRAX) tablet 400 mg         09/27/17 2300  vancomycin (VANCOCIN) IVPB 750 mg/150 ml premix  Status:  Discontinued     09/27/17 1122   09/27/17 2300  vancomycin (VANCOCIN) 500 mg in sodium chloride 0.9 % 100 mL IVPB  Status:  Discontinued     09/28/17 1529   09/27/17 1800  ceFEPIme (MAXIPIME) 1 g in sodium chloride 0.9 % 100 mL IVPB  Status:  Discontinued     09/28/17 1529   09/27/17 0945  vancomycin (VANCOCIN) IVPB 1000 mg/200 mL premix     09/27/17 1230   09/27/17  0945  ceFEPIme (MAXIPIME) 2 g in sodium chloride 0.9 % 100 mL IVPB     09/27/17 1111       Devices    LINES / TUBES:      Continuous Infusions: . albumin human       Objective: Vitals:   10/02/17 2337 10/03/17 0347 10/03/17 0730 10/03/17 0800  BP:  (!) 87/62 90/68 (!) 87/68  Pulse:  78 63 63   Resp:  15 14 (!) 23  Temp: 97.8 F (36.6 C) 97.6 F (36.4 C) 97.6 F (36.4 C)   TempSrc: Oral Oral Oral   SpO2:  (!) 89% 91% 91%  Weight:  148 lb 3.2 oz (67.2 kg)    Height:        Intake/Output Summary (Last 24 hours) at 10/03/2017 0905 Last data filed at 10/03/2017 0355 Gross per 24 hour  Intake 480 ml  Output 1150 ml  Net -670 ml   Filed Weights   09/29/17 0500 09/30/17 0500 10/03/17 0347  Weight: 138 lb 14.2 oz (63 kg) 143 lb 8.3 oz (65.1 kg) 148 lb 3.2 oz (67.2 kg)    Physical Exam:  General: A/O x 4 , Acute on Chronic respiratory distress, cachectic Neck:  Negative scars, masses, torticollis, lymphadenopathy, JVD Lungs: decreased breath sounds diffusely, LEFT>>> RIGHT.  Positive rhonchi, absent breath sounds LLL, negative  wheezes or crackles Cardiovascular: Irregular regular rhythm and rate, negative murmur, gallop.  Negative rub, normal S1 and S2 Abdomen: negative abdominal pain, nondistended, positive soft, bowel sounds, no rebound, no ascites, no appreciable mass Extremities: No significant cyanosis, clubbing, or edema bilateral lower extremities Skin: Negative rashes, lesions, ulcers Psychiatric:  Negative depression, negative anxiety, negative fatigue, negative mania  Central nervous system:  Cranial nerves II through XII intact, tongue/uvula midline, all extremities muscle strength 5/5, sensation intact throughout, negative dysarthria, negative expressive aphasia, negative receptive aphasia.   .     Data Reviewed: Care during the described time interval was provided by me .  I have reviewed this patient's available data, including medical history, events of note, physical examination, and all test results as part of my evaluation.   CBC: Recent Labs  Lab 09/27/17 0917 09/29/17 0701 09/30/17 0552 10/01/17 0538 10/02/17 0420 10/03/17 0517  WBC 5.2 5.1 6.3 7.0 7.0 8.2  NEUTROABS 4.6  --   --   --   --   --   HGB 9.1* 12.2* 11.3* 11.6* 11.6* 12.0*  HCT  28.4* 37.3* 35.5* 36.8* 37.5* 38.2*  MCV 101.8* 95.9 98.1 100.0 100.0 100.8*  PLT 186 145* 160 161 161 197   Basic Metabolic Panel: Recent Labs  Lab 09/29/17 0701 09/30/17 0552 10/01/17 0538 10/02/17 0420 10/03/17 0517  NA 137 140 138 139 142  K 5.4* 4.3 4.5 4.7 4.9  CL 97* 99* 99* 99* 100*  CO2 30 35* 32 33* 34*  GLUCOSE 85 99 92 104* 118*  BUN 29* 30* 30* 35* 41*  CREATININE 0.94 1.03 1.01 1.05 1.05  CALCIUM 7.9* 7.8* 7.9* 8.0* 8.2*  MG  --  1.7  --  1.6* 1.6*   GFR: Estimated Creatinine Clearance: 58.7 mL/min (by C-G formula based on SCr of 1.05 mg/dL). Liver Function Tests: Recent Labs  Lab 09/27/17 0917 09/29/17 0701 09/30/17 0552  AST _0 ALT 11* 9* 11*  ALKPHOS 225* 240* 258*  BILITOT 0.5 0.9 0.5  PROT 4.0* 3.9* 4.0*  ALBUMIN 1.6* 1.7* 1.6*   No results for input(s): LIPASE, AMYLASE  in the last 168 hours. No results for input(s): AMMONIA in the last 168 hours. Coagulation Profile: No results for input(s): INR, PROTIME in the last 168 hours. Cardiac Enzymes: Recent Labs  Lab 09/27/17 0917  TROPONINI 0.09*   BNP (last 3 results) No results for input(s): PROBNP in the last 8760 hours. HbA1C: No results for input(s): HGBA1C in the last 72 hours. CBG: No results for input(s): GLUCAP in the last 168 hours. Lipid Profile: No results for input(s): CHOL, HDL, LDLCALC, TRIG, CHOLHDL, LDLDIRECT in the last 72 hours. Thyroid Function Tests: No results for input(s): TSH, T4TOTAL, FREET4, T3FREE, THYROIDAB in the last 72 hours. Anemia Panel: No results for input(s): VITAMINB12, FOLATE, FERRITIN, TIBC, IRON, RETICCTPCT in the last 72 hours. Urine analysis:    Component Value Date/Time   COLORURINE YELLOW 09/23/2017 0930   APPEARANCEUR HAZY (A) 09/23/2017 0930   LABSPEC 1.009 09/23/2017 0930   PHURINE 5.0 09/23/2017 0930   GLUCOSEU NEGATIVE 09/23/2017 0930   HGBUR LARGE (A) 09/23/2017 0930   BILIRUBINUR NEGATIVE 09/23/2017 0930   KETONESUR NEGATIVE  09/23/2017 0930   PROTEINUR 30 (A) 09/23/2017 0930   NITRITE NEGATIVE 09/23/2017 0930   LEUKOCYTESUR NEGATIVE 09/23/2017 0930   Sepsis Labs: _0 (procalcitonin:4,lacticidven:4)  ) Recent Results (from the past 240 hour(s))  Blood culture (routine x 2)     Status: None   Collection Time: 09/27/17  9:52 AM  Result Value Ref Range Status   Specimen Description BLOOD RIGHT CHEST  Final   Special Requests   Final    BOTTLES DRAWN AEROBIC AND ANAEROBIC Blood Culture results may not be optimal due to an excessive volume of blood received in culture bottles   Culture   Final    NO GROWTH 5 DAYS Performed at [facility], [place] [address] [place], [place] [address]    Report Status 10/02/2017 FINAL  Final  Blood culture (routine x 2)     Status: None   Collection Time: 09/27/17 10:30 AM  Result Value Ref Range Status   Specimen Description BLOOD RIGHT HAND  Final   Special Requests   Final    BOTTLES DRAWN AEROBIC AND ANAEROBIC Blood Culture results may not be optimal due to an inadequate volume of blood received in culture bottles   Culture   Final    NO GROWTH 5 DAYS Performed at [facility], [place] [address] [place], [place] [address]    Report Status 10/02/2017 FINAL  Final  MRSA PCR Screening     Status: None   Collection Time: 09/28/17  9:04 AM  Result Value Ref Range Status   MRSA by PCR NEGATIVE NEGATIVE Final    Comment:        The GeneXpert MRSA Assay (FDA approved for NASAL specimens only), is one component of a comprehensive MRSA colonization surveillance program. It is not intended to diagnose MRSA infection nor to guide or monitor treatment for MRSA infections. Performed at [facility], [place] [address], [place], [place] [address]          Radiology Studies: No results found.      Scheduled Meds: . acyclovir  400 mg Oral Daily  . dextromethorphan-guaiFENesin  1 tablet Oral BID  . folic acid  1 mg Oral Daily  . furosemide  40  mg Oral BID  . miconazole nitrate  1 application Topical BID  . midodrine  10 mg Oral TID WC  . predniSONE  50 mg Oral Q breakfast  .  pregabalin  50 mg Oral BID  . rivaroxaban  20 mg Oral Daily  . senna-docusate  1 tablet Oral BID   Continuous Infusions: . albumin human       LOS: 6 days    Time spent: 40 minutes    Vanden Fawaz, Geraldo Docker, MD [facility] Pager [phone]   If 7PM-7AM, please contact night-coverage [email or web address] Password [email or web address] 10/03/2017, 9:05 AM

## 2017-10-04 DIAGNOSIS — R7989 Other specified abnormal findings of blood chemistry: Secondary | ICD-10-CM

## 2017-10-04 LAB — CBC
HEMATOCRIT: 34.4 % — AB (ref 39.0–52.0)
HEMOGLOBIN: 10.8 g/dL — AB (ref 13.0–17.0)
MCH: 31.4 pg (ref 26.0–34.0)
MCHC: 31.4 g/dL (ref 30.0–36.0)
MCV: 100 fL (ref 78.0–100.0)
Platelets: 169 10*3/uL (ref 150–400)
RBC: 3.44 MIL/uL — AB (ref 4.22–5.81)
RDW: 16.6 % — ABNORMAL HIGH (ref 11.5–15.5)
WBC: 6.8 10*3/uL (ref 4.0–10.5)

## 2017-10-04 LAB — BASIC METABOLIC PANEL
ANION GAP: 6 (ref 5–15)
BUN: 45 mg/dL — ABNORMAL HIGH (ref 6–20)
CHLORIDE: 100 mmol/L — AB (ref 101–111)
CO2: 36 mmol/L — ABNORMAL HIGH (ref 22–32)
Calcium: 8.4 mg/dL — ABNORMAL LOW (ref 8.9–10.3)
Creatinine, Ser: 0.85 mg/dL (ref 0.61–1.24)
GFR calc non Af Amer: >60 mL/min (ref >60)
Glucose, Bld: 127 mg/dL — ABNORMAL HIGH (ref 65–99)
Potassium: 4.1 mmol/L (ref 3.5–5.1)
Sodium: 142 mmol/L (ref 135–145)

## 2017-10-04 LAB — ALBUMIN: ALBUMIN: 2.1 g/dL — AB (ref 3.5–5.0)

## 2017-10-04 LAB — MAGNESIUM: MAGNESIUM: 1.6 mg/dL — AB (ref 1.7–2.4)

## 2017-10-04 NOTE — Progress Notes (Signed)
PROGRESS NOTE    Patient: [name]     PCP: Biagio Borg, MD                    DOB: 08-24-1945            DOA: 09/27/2017 ZOX:[id number]             DOS: 10/04/2017, 12:57 PM   LOS: 7 days   Date of Service: The patient was seen and examined on 10/04/2017  Subjective:   Patient was seen and examined this morning, stable no acute distress, much improved shortness of breath no issues overnight.  Back to her baseline of 3 L of oxygen, satting greater than 92% Eating her breakfast this morning. Pressure stable this morning, 110/71, good urine output over past 24 hours    ----------------------------------------------------------------------------------------------------------------------  Brief Narrative:   [name] is a 72 year old male with extensive history of diastolic congestive heart failure ejection fraction 55%, chronic atrial fibrillation on Xarelto, COPD, O2 dependent 3 L at home, history of malignant lymphoplasmacytic lymphoma, lysed for acute on chronic heart failure, secondary to cardiac amyloidosis was admitted from 5/9-  09/26/2017. Resented once again with acute on chronic respiratory failure.  Was discharged from [facility] on the evening prior to discharge... Was diuresed approximately 7 L at home on 4 L via nasal cannula Now she is back to her baseline 3 L oxygen, shortness of breath has improved   Principal Problem:   Acute-on-chronic respiratory failure (HCC) Active Problems:   Bilateral pleural effusion   COPD mixed type (HCC)   Protein-calorie malnutrition, severe (HCC)   Loculated pleural effusion   Empyema (HCC)   Persistent atrial fibrillation (HCC)   Acute on chronic combined systolic and diastolic CHF (congestive heart failure) (HCC)   Pulmonary HTN (HCC)   Aspiration pneumonia (HCC)   Hypoalbuminemia   Hypotension   Elevated brain natriuretic peptide (BNP) level   Nephrotic syndrome   Elevated troponin   Anemia   Lethargy  Failure to thrive in adult   Acute on chronic congestive heart failure (HCC)   Assessment & Plan:   Acute on chronic respiratory failure with hypoxia/Emphysema/OPD - Multifactorial with severe emphysema, COPD, bilateral pleural effusion, edema, pneumonia -Much improved, continue treating underlying causes -He will on prednisone on a short taper started on 523 -Continue flutter valve, 4 times daily DuoNeb, Mucinex -RT consultation for pulmonary toiletry -CT of angiogram revealed severe COPD  Chronic Bilateral pleural Effusion/ RIGHT Empyema -Currently stable -Patient seen by Dr. Tharon Aquas Trigt cardiothoracic surgery 12/11/2016 as outpatient follow-up for RIGHT sided VATS procedure.  At that time no further surgical intervention required. -5/13 S/P thoracentesis (previous admission) - LEFT pleural effusion appears >> RIGHT effusion.   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 Brunetta Genera - Per patient and wife completed chemotherapy ( Bendamustine/Rituxan).  Has completed 6 cycles (last cycle in April): Partial response PER oncology note.  -Extensive conversation with patient and wife explaining that amyloidosis is not reversible, although chemotherapy may slow further deposition. -Patient's wife nods that she understands the above comments  IgG kappa MGUS  -Per Dr. Grier Mitts oncology note patient also diagnosed with MGUS.  No overt evidence of cardiac involvement  based on ECHO     Hypothermia -Negative indication of infection, most likely secondary to poor nutritional status.   -Continue bear hugger PRN   Chronic diastolic CHF -Believe secondary to cardiac AL  amyloidosis.  No new suggestions per cardiology -Strict in and out since admission -2.9 L - Daily weight      Filed Weights   09/29/17 0500 09/30/17 0500 10/03/17 0347  Weight: 138 lb 14.2 oz (63 kg) 143 lb 8.3 oz (65.1 kg) 148 lb 3.2 oz (67.2 kg)  -Lasix 40 mg BID - Diuretic strategy will be based on patient's weight.  Will need to adjust Lasix dosing dependent on daily weight.  Dr. Thereasa Solo addressed this issue with patient and wife however not sure they will be able to follow this complicated regimen.  Will most likely need SNF placement or daily home health aide to assist them - Follow-up with Dr. Aundra Dubin as outpatient  Chronic hypotension - Midodrine 10 mg TID  Persistent atrial fibrillation -History of failed DCCV -Currently rate controlled history of failed DCCV  -On Xarelto for anticoagulation   Nephrotic syndrome (by biopsy) -Secondary to AL amyloidosis - see discussion above  LastLabs          Recent Labs  Lab 09/27/17 0917 09/29/17 0701 09/30/17 0552 10/01/17 0538 10/02/17 0420 10/03/17 0517  CREATININE 1.01 0.94 1.03 1.01 1.05 1.05     Chronic pain -Improved, -Chronic back pain evaluated by MRI T-spine on 03/26/2017- for metastatic disease or fracture - Currently comfortable.  Severe protein calorie malnutrition -Calorie count in progress -If patient not consuming adequate calories will start on appetite stimulant: Megace? -Adjust diet per nutrition  Iron deficiency anemia/folate deficiency anemia - Transfuse 2 units PRBC  Pressure injury of sacrum -Care per [facility]OC protocol  Hematuria and urinary retention Foley cath to remain in place    DVT prophylaxis: Xarelto Code Status: DNR Family Communication: At bedside for discussion of plan of care Disposition Plan: TBD   Consults: Cardiology  Procedures/Significant Events:  5/10 echocardiogram: Action fraction 60 to 65%, moderate LVH, normal systolic function, mitral valve  calcification mild regurgitation, severe left atrium dilatation, tricuspid regurgitation  Antimicrobials:  Anti-infectives (From admission, onward)   Start     Dose/Rate Route Frequency Ordered Stop   09/28/17 1000  acyclovir (ZOVIRAX) tablet 400 mg     400 mg Oral Daily 09/27/17 1439     09/27/17 2300  vancomycin (VANCOCIN) IVPB 750 mg/150 ml premix  Status:  Discontinued     750 mg 150 mL/hr over 60 Minutes Intravenous Every 12 hours 09/27/17 0937 09/27/17 1122   09/27/17 2300  vancomycin (VANCOCIN) 500 mg in sodium chloride 0.9 % 100 mL IVPB  Status:  Discontinued     500 mg 100 mL/hr over 60 Minutes Intravenous Every 12 hours 09/27/17 1122 09/28/17 1529   09/27/17 1800  ceFEPIme (MAXIPIME) 1 g in sodium chloride 0.9 % 100 mL IVPB  Status:  Discontinued     1 g 200 mL/hr over 30 Minutes Intravenous Every 8 hours 09/27/17 0937 09/28/17 1529   09/27/17 0945  vancomycin (VANCOCIN) IVPB 1000 mg/200 mL premix     1,000 mg 200 mL/hr over 60 Minutes Intravenous  Once 09/27/17 0937 09/27/17 1230   09/27/17 0945  ceFEPIme (MAXIPIME) 2 g in sodium chloride 0.9 % 100 mL IVPB     2 g 200 mL/hr over 30 Minutes Intravenous  Once 09/27/17 6440 09/27/17 1111        Objective: Vitals:  10/04/17 0900 10/04/17 1000 10/04/17 1100 10/04/17 1200  BP: 1_0 110/71  Pulse: 67 64 62 (!) 43  Resp: _1 Temp:      TempSrc:      SpO2: 95% 95% 90% 93%  Weight:      Height:        Intake/Output Summary (Last 24 hours) at 10/04/2017 1257 Last data filed at 10/04/2017 0428 Gross per 24 hour  Intake 360 ml  Output 1625 ml  Net -1265 ml   Filed Weights   09/29/17 0500 09/30/17 0500 10/03/17 0347  Weight: 63 kg (138 lb 14.2 oz) 65.1 kg (143 lb 8.3 oz) 67.2 kg (148 lb 3.2 oz)    Examination:  General exam: Appears calm and comfortable, wife present at bedside, having breakfast Psychiatry: Judgement and insight appear normal. Mood & affect appropriate. HEENT:  WNLs Respiratory system: Diffuse wheezes, mild rhonchi's at the middle lobe, diminished breath sounds in lower lobe with mild crackles deep inspirations Cardiovascular system: S1 & S2 heard, RRR. No JVD, murmurs, rubs, gallops or clicks. No pedal edema. Gastrointestinal system: Abd. nondistended, soft and nontender. No organomegaly or masses felt. Normal bowel sounds heard. Central nervous system: Alert and oriented. No focal neurological deficits. Extremities: Symmetric 5 x 5 power. Skin: No rashes, lesions or ulcers Wounds:   Data Reviewed: I have personally reviewed following labs and imaging studies  CBC: Recent Labs  Lab 09/30/17 0552 10/01/17 0538 10/02/17 0420 10/03/17 0517 10/04/17 0349  WBC 6.3 7.0 7.0 8.2 6.8  HGB 11.3* 11.6* 11.6* 12.0* 10.8*  HCT 35.5* 36.8* 37.5* 38.2* 34.4*  MCV 98.1 100.0 100.0 100.8* 100.0  PLT 160 161 161 180 119   Basic Metabolic Panel: Recent Labs  Lab 09/30/17 0552 10/01/17 0538 10/02/17 0420 10/03/17 0517 10/04/17 0349  NA 140 138 139 142 142  K 4.3 4.5 4.7 4.9 4.1  CL 99* 99* 99* 100* 100*  CO2 35* 32 33* 34* 36*  GLUCOSE 99 92 104* 118* 127*  BUN 30* 30* 35* 41* 45*  CREATININE 1.03 1.01 1.05 1.05 0.85  CALCIUM 7.8* 7.9* 8.0* 8.2* 8.4*  MG 1.7  --  1.6* 1.6* 1.6*   GFR: Estimated Creatinine Clearance: 72.5 mL/min (by C-G formula based on SCr of 0.85 mg/dL). Liver Function Tests: Recent Labs  Lab 09/29/17 0701 09/30/17 0552 10/04/17 0349  AST 19 21  --   ALT 9* 11*  --   ALKPHOS 240* 258*  --   BILITOT 0.9 0.5  --   PROT 3.9* 4.0*  --   ALBUMIN 1.7* 1.6* 2.1*    Recent Results (from the past 240 hour(s))  Blood culture (routine x 2)     Status: None   Collection Time: 09/27/17  9:52 AM  Result Value Ref Range Status   Specimen Description BLOOD RIGHT CHEST  Final   Special Requests   Final    BOTTLES DRAWN AEROBIC AND ANAEROBIC Blood Culture results may not be optimal due to an excessive volume of blood received  in culture bottles   Culture   Final    NO GROWTH 5 DAYS Performed at [facility], [place] [address] [place], [place] [address]    Report Status 10/02/2017 FINAL  Final  Blood culture (routine x 2)     Status: None   Collection Time: 09/27/17 10:30 AM  Result Value Ref Range Status   Specimen Description BLOOD RIGHT HAND  Final   Special Requests  Final    BOTTLES DRAWN AEROBIC AND ANAEROBIC Blood Culture results may not be optimal due to an inadequate volume of blood received in culture bottles   Culture   Final    NO GROWTH 5 DAYS Performed at [facility], [place] [address], [place], [place] [address]    Report Status 10/02/2017 FINAL  Final  MRSA PCR Screening     Status: None   Collection Time: 09/28/17  9:04 AM  Result Value Ref Range Status   MRSA by PCR NEGATIVE NEGATIVE Final    Comment:        The GeneXpert MRSA Assay (FDA approved for NASAL specimens only), is one component of a comprehensive MRSA colonization surveillance program. It is not intended to diagnose MRSA infection nor to guide or monitor treatment for MRSA infections. Performed at [facility], [place] [address], [place], [place] [address]       Radiology Studies: No results found.  Scheduled Meds: . acyclovir  400 mg Oral Daily  . dextromethorphan-guaiFENesin  1 tablet Oral BID  . folic acid  1 mg Oral Daily  . furosemide  40 mg Oral BID  . miconazole nitrate  1 application Topical BID  . midodrine  10 mg Oral TID WC  . predniSONE  50 mg Oral Q breakfast  . pregabalin  50 mg Oral BID  . rivaroxaban  20 mg Oral Daily  . senna-docusate  1 tablet Oral BID   Continuous Infusions:  Time spent: >25 minutes  Deatra James, MD [facility],  Pager [phone]  If 7PM-7AM, please contact night-coverage [email or web address]   Password [facility]  10/04/2017, 12:57 PM

## 2017-10-05 LAB — CBC
HCT: 37.3 % — ABNORMAL LOW (ref 39.0–52.0)
Hemoglobin: 11.6 g/dL — ABNORMAL LOW (ref 13.0–17.0)
MCH: 31.4 pg (ref 26.0–34.0)
MCHC: 31.1 g/dL (ref 30.0–36.0)
MCV: 100.8 fL — ABNORMAL HIGH (ref 78.0–100.0)
PLATELETS: 205 10*3/uL (ref 150–400)
RBC: 3.7 MIL/uL — AB (ref 4.22–5.81)
RDW: 16.8 % — ABNORMAL HIGH (ref 11.5–15.5)
WBC: 8.6 10*3/uL (ref 4.0–10.5)

## 2017-10-05 LAB — BASIC METABOLIC PANEL
ANION GAP: 7 (ref 5–15)
BUN: 42 mg/dL — ABNORMAL HIGH (ref 6–20)
CO2: 34 mmol/L — ABNORMAL HIGH (ref 22–32)
Calcium: 8.3 mg/dL — ABNORMAL LOW (ref 8.9–10.3)
Chloride: 98 mmol/L — ABNORMAL LOW (ref 101–111)
Creatinine, Ser: 0.96 mg/dL (ref 0.61–1.24)
GFR calc Af Amer: >60 mL/min (ref >60)
Glucose, Bld: 126 mg/dL — ABNORMAL HIGH (ref 65–99)
POTASSIUM: 4.4 mmol/L (ref 3.5–5.1)
SODIUM: 139 mmol/L (ref 135–145)

## 2017-10-05 LAB — MAGNESIUM: MAGNESIUM: 1.6 mg/dL — AB (ref 1.7–2.4)

## 2017-10-05 MED ORDER — PREDNISONE 5 MG PO TABS
25.0000 mg | ORAL_TABLET | Freq: Every day | ORAL | Status: DC
  Administered 2017-10-06 – 2017-10-09 (×4): 25 mg via ORAL
  Filled 2017-10-05 (×4): qty 1

## 2017-10-05 MED ORDER — TRAZODONE HCL 50 MG PO TABS
50.0000 mg | ORAL_TABLET | Freq: Once | ORAL | Status: AC
  Administered 2017-10-05: 50 mg via ORAL
  Filled 2017-10-05: qty 1

## 2017-10-05 NOTE — Clinical Social Work Note (Signed)
CSW met with patient's wife-Janet Schellhorn in conference room to discuss bed choice. CSW provided bed choices. Wife stated first choice is [facility]. CSW explained they have not made a decision, but CSW to contact [facility]. Patient stated if [facility] declines, [facility] is the second choice and they have made an offer. CSW left voicemail with [facility] Admission Coordinator Levada Dy. CSW informed wife that unit CSW to follow up with wife on decision.   3:46pm - [facility] declined. Unit CSW to discuss transition to [facility], as this was her second choice. CSW continuing to follow for discharge needs.   Oretha Ellis, [place], [place] Weekend Coverage-ED [phone]

## 2017-10-05 NOTE — Progress Notes (Signed)
PROGRESS NOTE    [name]  GYI:[id number] DOB: 07-May-1946 DOA: 09/27/2017 PCP: Biagio Borg, MD   Brief Narrative:  72 y.o.WM PMHx  Chronic diastolic CHF (EF of 35%), atrial fibrillation on Xarelto, COPD on home oxygen at 3 L, malignant lymphoplasmayctic lymphoma, and a hospitalization for acute on chronic heart failure secondary to cardiac amyloidosis 09/18/17 > 09/26/17   Presented to the ED w/ recurrent hypoxic respiratory failure with multifactorial etiology.    Patient was discharged from [facility] the evening prior to his return to the ED. During that admission he diuresed 7 L. He was sent home on 4L [place], increased from his prior 3L.        Subjective: 5/26 slightly groggy this a.m. but arousable, A/O x4.  States wife has not had a chance to evaluate SNFs.  Positive acute on chronic COPD, negative CP, negative abdominal pain.       Assessment & Plan:   Principal Problem:   Acute-on-chronic respiratory failure (HCC) Active Problems:   Bilateral pleural effusion   COPD mixed type (HCC)   Protein-calorie malnutrition, severe (HCC)   Loculated pleural effusion   Empyema (HCC)   Persistent atrial fibrillation (HCC)   Acute on chronic combined systolic and diastolic CHF (congestive heart failure) (HCC)   Pulmonary HTN (HCC)   Aspiration pneumonia (HCC)   Hypoalbuminemia   Hypotension   Elevated brain natriuretic peptide (BNP) level   Nephrotic syndrome   Elevated troponin   Anemia   Lethargy   Failure to thrive in adult   Acute on chronic congestive heart failure (HCC)  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 Brunetta Genera - Per patient and wife completed chemotherapy ( Bendamustine/Rituxan).  Has completed 6 cycles (last cycle in April): Partial response PER oncology note.  -Extensive conversation with patient and wife explaining that amyloidosis is not reversible, although  chemotherapy may slow further deposition, secondary to omission of lymphoma..  IgG kappa MGUS  -Per Dr. Grier Mitts oncology note patient also diagnosed with MGUS.  No overt evidence of cardiac involvement based on ECHO   Anasarca - Secondary to low oncotic state.  Albumin 1.6. -Cannot aggressively diuresis as this will lead to hypoperfusion and worsening of endorgan function. -See CHF   Hypothermia -Negative indication of infection, most likely secondary to poor nutritional status.   -Continue bear hugger PRN   Chronic diastolic CHF -Believe secondary to cardiac AL amyloidosis.  No new suggestions per cardiology -Strict in and out since admission -4.9 L - Daily weight Filed Weights   09/29/17 0500 09/30/17 0500 10/03/17 0347  Weight: 138 lb 14.2 oz (63 kg) 143 lb 8.3 oz (65.1 kg) 148 lb 3.2 oz (67.2 kg)  -Lasix 40 mg BID - Diuretic strategy will be based on patient's weight.  Will need to adjust Lasix dosing dependent on daily weight.  Dr. Thereasa Solo addressed this issue with patient and wife however not sure they will be able to follow this complicated regimen.  Will most likely need SNF placement or daily home health aide to assist them - Follow-up with Dr. Aundra Dubin as outpatient  Chronic hypotension - Midodrine 10 mg TID  Persistent atrial fibrillation -History of failed DCCV -Currently rate controlled.Hx  failed DCCV  -On Xarelto for anticoagulation  Acute on chronic respiratory failure with hypoxia/Emphysema -Multifactorial severe emphysema, bilateral pleural effusion (developing empyema RIGHT?),  Pneumonia? -Continue to treat underlying problems, mild improvement. - 5/26 begin steroid taper.  Decrease Prednisone 25 mg daily -Flutter valve -DuoNeb QID - Mucinex DM - Counseled patient and wife that we will start physical therapy vest if patient continues to have problems clearing lung secretions.  Chronic Bilateral pleural Effusion/ RIGHT Empyema -Patient seen by Dr. Tharon Aquas  Trigt cardiothoracic surgery 12/11/2016 as outpatient follow-up for RIGHT sided VATS procedure.  At that time no further surgical intervention required. -5/13 S/P thoracentesis (previous admission) - LEFT pleural effusion appears >> RIGHT effusion.  COPD  -By CT angiogram severe COPD - See respiratory failure   Nephrotic syndrome (by biopsy) -Secondary to AL amyloidosis - see discussion above  Recent Labs  Lab 09/29/17 0701 09/30/17 0552 10/01/17 0538 10/02/17 0420 10/03/17 0517 10/04/17 0349 10/05/17 0401  CREATININE 0.94 1.03 1.01 1.05 1.05 0.85 0.96   Chronic pain -Chronic back pain evaluated by MRI T-spine on 03/26/2017- for metastatic disease or fracture - Currently comfortable.    Severe protein calorie malnutrition -Calorie count in progress -If patient not consuming adequate calories will start on appetite stimulant: Megace? -Adjust diet per nutrition    Iron deficiency anemia/folate deficiency anemia - Transfuse 2 units PRBC   Pressure injury of sacrum -Care per WOC protocol   Hematuria and urinary retention Foley cath to remain in place        DVT prophylaxis: Xarelto Code Status: DNR Family Communication: At bedside for discussion of plan of care Disposition Plan: TBD   Consultants:  Cardiology     Procedures/Significant Events:  5/10 echocardiogram:Left ventricle:/Incomplete study secondary to patient's discomfort.  The cavity size was normal.  moderate LVH. LVEF= 60% to 65%.-- The study is not technically sufficient to   allow evaluation of LV diastolic function. - Left atrium: Severely dilated. ------------------------------------------------------------------------------------------------------------------------  5/17 d/c from [facility] 5/18 admit to [facility]  5/18 CT angiogram PE protocol:-Negative PE - Emphysema -Worsening dependent airspace disease and pleural effusions throughout all lobes, favored to represent sequela of aspiration and  aspiration pneumonia/pneumonitis.  -evidence of partial loculation of right-sided pleural fluid, potentially representing developing empyema versus complex pleural fluid. -Reactive mediastinal lymphadenopathy. Partially calcified lymph nodes of the mediastinum, compatible with prior granulomatous disease. 5/22 PCXR:-RIGHT chest wall port.-Changes of prior granulomatous disease with multiple calcified lymph nodes are again seen. Diffuse airspace opacity is again identified RIGHT>>LEFT stable - Bilateral pleural effusion     I have personally reviewed and interpreted all radiology studies and my findings are as above.  VENTILATOR SETTINGS:    Cultures   Antimicrobials: Anti-infectives (From admission, onward)   Start     Stop   09/28/17 1000  acyclovir (ZOVIRAX) tablet 400 mg         09/27/17 2300  vancomycin (VANCOCIN) IVPB 750 mg/150 ml premix  Status:  Discontinued     09/27/17 1122   09/27/17 2300  vancomycin (VANCOCIN) 500 mg in sodium chloride 0.9 % 100 mL IVPB  Status:  Discontinued     09/28/17 1529   09/27/17 1800  ceFEPIme (MAXIPIME) 1 g in sodium chloride 0.9 % 100 mL IVPB  Status:  Discontinued     09/28/17 1529   09/27/17 0945  vancomycin (VANCOCIN) IVPB 1000 mg/200 mL premix     09/27/17 1230   09/27/17 0945  ceFEPIme (MAXIPIME) 2 g in sodium chloride 0.9 % 100 mL IVPB     09/27/17 1111       Devices    LINES / TUBES:      Continuous Infusions:    Objective: Vitals:   10/04/17 1200  10/04/17 1932 10/04/17 2232 10/05/17 0339  BP: 110/71 108/73 (!) 102/58 106/65  Pulse: (!) 43 63 75 76  Resp: _0 Temp:  97.6 F (36.4 C) 97.8 F (36.6 C) 97.6 F (36.4 C)  TempSrc:  Oral Oral Oral  SpO2: 93% 95% 92% 93%  Weight:      Height:        Intake/Output Summary (Last 24 hours) at 10/05/2017 0811 Last data filed at 10/05/2017 0341 Gross per 24 hour  Intake 480 ml  Output 1800 ml  Net -1320 ml   Filed Weights   09/29/17 0500 09/30/17 0500  10/03/17 0347  Weight: 138 lb 14.2 oz (63 kg) 143 lb 8.3 oz (65.1 kg) 148 lb 3.2 oz (67.2 kg)    Physical Exam:  General: Drowsy, but arousable A/O x4, acute on chronic respiratory distress, cachectic Neck:  Negative scars, masses, torticollis, lymphadenopathy, JVD Lungs: decreased breath sounds diffusely LEFT>> right.  Absent breath sounds LLL, negative wheezes or crackles  Cardiovascular: Irregularly irregular rhythm and rate, without murmur gallop or rub normal S1 and S2 Abdomen: negative abdominal pain, nondistended, positive soft, bowel sounds, no rebound, no ascites, no appreciable mass Extremities: No significant cyanosis, clubbing, or edema bilateral lower extremities Skin: Negative rashes, lesions, ulcers Psychiatric:  Negative depression, negative anxiety, negative fatigue, negative mania  Central nervous system:  Cranial nerves II through XII intact, tongue/uvula midline, all extremities muscle strength 5/5, sensation intact throughout, negative dysarthria, negative expressive aphasia, negative receptive aphasia.    .     Data Reviewed: Care during the described time interval was provided by me .  I have reviewed this patient's available data, including medical history, events of note, physical examination, and all test results as part of my evaluation.   CBC: Recent Labs  Lab 10/01/17 0538 10/02/17 0420 10/03/17 0517 10/04/17 0349 10/05/17 0401  WBC 7.0 7.0 8.2 6.8 8.6  HGB 11.6* 11.6* 12.0* 10.8* 11.6*  HCT 36.8* 37.5* 38.2* 34.4* 37.3*  MCV 100.0 100.0 100.8* 100.0 100.8*  PLT 161 161 180 169 003   Basic Metabolic Panel: Recent Labs  Lab 09/30/17 0552 10/01/17 0538 10/02/17 0420 10/03/17 0517 10/04/17 0349 10/05/17 0401  NA 140 138 139 142 142 139  K 4.3 4.5 4.7 4.9 4.1 4.4  CL 99* 99* 99* 100* 100* 98*  CO2 35* 32 33* 34* 36* 34*  GLUCOSE 99 92 104* 118* 127* 126*  BUN 30* 30* 35* 41* 45* 42*  CREATININE 1.03 1.01 1.05 1.05 0.85 0.96  CALCIUM 7.8*  7.9* 8.0* 8.2* 8.4* 8.3*  MG 1.7  --  1.6* 1.6* 1.6* 1.6*   GFR: Estimated Creatinine Clearance: 64.2 mL/min (by C-G formula based on SCr of 0.96 mg/dL). Liver Function Tests: Recent Labs  Lab 09/29/17 0701 09/30/17 0552 10/04/17 0349  AST 19 21  --   ALT 9* 11*  --   ALKPHOS 240* 258*  --   BILITOT 0.9 0.5  --   PROT 3.9* 4.0*  --   ALBUMIN 1.7* 1.6* 2.1*   No results for input(s): LIPASE, AMYLASE in the last 168 hours. No results for input(s): AMMONIA in the last 168 hours. Coagulation Profile: No results for input(s): INR, PROTIME in the last 168 hours. Cardiac Enzymes: No results for input(s): CKTOTAL, CKMB, CKMBINDEX, TROPONINI in the last 168 hours. BNP (last 3 results) No results for input(s): PROBNP in the last 8760 hours. HbA1C: No results for input(s): HGBA1C in the last 72 hours. CBG:  No results for input(s): GLUCAP in the last 168 hours. Lipid Profile: No results for input(s): CHOL, HDL, LDLCALC, TRIG, CHOLHDL, LDLDIRECT in the last 72 hours. Thyroid Function Tests: No results for input(s): TSH, T4TOTAL, FREET4, T3FREE, THYROIDAB in the last 72 hours. Anemia Panel: No results for input(s): VITAMINB12, FOLATE, FERRITIN, TIBC, IRON, RETICCTPCT in the last 72 hours. Urine analysis:    Component Value Date/Time   COLORURINE YELLOW 09/23/2017 0930   APPEARANCEUR HAZY (A) 09/23/2017 0930   LABSPEC 1.009 09/23/2017 0930   PHURINE 5.0 09/23/2017 0930   GLUCOSEU NEGATIVE 09/23/2017 0930   HGBUR LARGE (A) 09/23/2017 0930   BILIRUBINUR NEGATIVE 09/23/2017 0930   KETONESUR NEGATIVE 09/23/2017 0930   PROTEINUR 30 (A) 09/23/2017 0930   NITRITE NEGATIVE 09/23/2017 0930   LEUKOCYTESUR NEGATIVE 09/23/2017 0930   Sepsis Labs: _0 (procalcitonin:4,lacticidven:4)  ) Recent Results (from the past 240 hour(s))  Blood culture (routine x 2)     Status: None   Collection Time: 09/27/17  9:52 AM  Result Value Ref Range Status   Specimen Description BLOOD RIGHT CHEST   Final   Special Requests   Final    BOTTLES DRAWN AEROBIC AND ANAEROBIC Blood Culture results may not be optimal due to an excessive volume of blood received in culture bottles   Culture   Final    NO GROWTH 5 DAYS Performed at [facility], [place] [address] [place], [place] [address]    Report Status 10/02/2017 FINAL  Final  Blood culture (routine x 2)     Status: None   Collection Time: 09/27/17 10:30 AM  Result Value Ref Range Status   Specimen Description BLOOD RIGHT HAND  Final   Special Requests   Final    BOTTLES DRAWN AEROBIC AND ANAEROBIC Blood Culture results may not be optimal due to an inadequate volume of blood received in culture bottles   Culture   Final    NO GROWTH 5 DAYS Performed at [facility], [place] [address], [place], [place] [address]    Report Status 10/02/2017 FINAL  Final  MRSA PCR Screening     Status: None   Collection Time: 09/28/17  9:04 AM  Result Value Ref Range Status   MRSA by PCR NEGATIVE NEGATIVE Final    Comment:        The GeneXpert MRSA Assay (FDA approved for NASAL specimens only), is one component of a comprehensive MRSA colonization surveillance program. It is not intended to diagnose MRSA infection nor to guide or monitor treatment for MRSA infections. Performed at [facility], [place] [address], [place], [place] [address]          Radiology Studies: No results found.      Scheduled Meds: . acyclovir  400 mg Oral Daily  . dextromethorphan-guaiFENesin  1 tablet Oral BID  . folic acid  1 mg Oral Daily  . furosemide  40 mg Oral BID  . miconazole nitrate  1 application Topical BID  . midodrine  10 mg Oral TID WC  . predniSONE  50 mg Oral Q breakfast  . pregabalin  50 mg Oral BID  . rivaroxaban  20 mg Oral Daily  . senna-docusate  1 tablet Oral BID   Continuous Infusions:    LOS: 8 days    Time spent: 40 minutes    WOODS, Geraldo Docker, MD [facility] Pager [phone]   If 7PM-7AM,  please contact night-coverage [email or web address] Password [email or web address] 10/05/2017, 8:11 AM

## 2017-10-06 DIAGNOSIS — I481 Persistent atrial fibrillation: Secondary | ICD-10-CM

## 2017-10-06 DIAGNOSIS — I950 Idiopathic hypotension: Secondary | ICD-10-CM

## 2017-10-06 LAB — CBC
HEMATOCRIT: 34.9 % — AB (ref 39.0–52.0)
Hemoglobin: 10.9 g/dL — ABNORMAL LOW (ref 13.0–17.0)
MCH: 31.9 pg (ref 26.0–34.0)
MCHC: 31.2 g/dL (ref 30.0–36.0)
MCV: 102 fL — ABNORMAL HIGH (ref 78.0–100.0)
Platelets: 181 10*3/uL (ref 150–400)
RBC: 3.42 MIL/uL — AB (ref 4.22–5.81)
RDW: 16.9 % — ABNORMAL HIGH (ref 11.5–15.5)
WBC: 8.4 10*3/uL (ref 4.0–10.5)

## 2017-10-06 LAB — BASIC METABOLIC PANEL
Anion gap: 6 (ref 5–15)
BUN: 41 mg/dL — ABNORMAL HIGH (ref 6–20)
CO2: 36 mmol/L — AB (ref 22–32)
Calcium: 8.3 mg/dL — ABNORMAL LOW (ref 8.9–10.3)
Chloride: 101 mmol/L (ref 101–111)
Creatinine, Ser: 0.92 mg/dL (ref 0.61–1.24)
GFR calc non Af Amer: >60 mL/min (ref >60)
Glucose, Bld: 118 mg/dL — ABNORMAL HIGH (ref 65–99)
POTASSIUM: 4.1 mmol/L (ref 3.5–5.1)
SODIUM: 143 mmol/L (ref 135–145)

## 2017-10-06 LAB — MAGNESIUM: MAGNESIUM: 1.5 mg/dL — AB (ref 1.7–2.4)

## 2017-10-06 MED ORDER — MIDODRINE HCL 5 MG PO TABS
15.0000 mg | ORAL_TABLET | Freq: Three times a day (TID) | ORAL | Status: DC
  Administered 2017-10-06 – 2017-10-09 (×11): 15 mg via ORAL
  Filled 2017-10-06 (×12): qty 3

## 2017-10-06 MED ORDER — MAGNESIUM SULFATE 4 GM/100ML IV SOLN
4.0000 g | Freq: Once | INTRAVENOUS | Status: AC
  Administered 2017-10-06: 4 g via INTRAVENOUS
  Filled 2017-10-06: qty 100

## 2017-10-06 NOTE — Progress Notes (Signed)
[place] TEAM 1 - Stepdown/ICU TEAM  CASHTON HOSLEY  UEK:[id number] DOB: [birth year] DOA: 09/27/2017 PCP: Biagio Borg, MD    Brief Narrative:  72 y.o. male with a hx of diastolic CHF (EF of 15%), atrial fibrillation on Xarelto, COPD on home oxygen at 3 L, malignant lymphoplasmayctic lymphoma, and a hospitalization for acute on chronic heart failure secondary to cardiac amyloidosis 09/18/17 > 09/26/17 who presented to the ED w/ recurrent hypoxic respiratory failure with multifactorial etiology.   Patient was discharged from [facility] the evening prior to his return to the ED. During that admission he diuresed 7 L. He was sent home on 4L [place], increased from his prior 3L.    Significant Events: 5/17 d/c from [facility] 5/18 admit to [facility]   Subjective: Awake and conversant.  Denies chest pain nausea or vomiting.  Systolic blood pressure has been in the 80s today but mean arterial pressure has been 65 or greater.  Patient denies shortness of breath.  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 - there is no short term fix for this situation - ongoing volume overload is expected - attempts to diurese aggressively will likely lead to poor organ perfusion and worsening of chronic hypotension - I have discussed this with he and his wife at length and attempted to explain the reasoning - he will likely need a when necessary Lasix dosing strategy based on his weight and not a standard scheduled dose - I have placed parameters on his lasix dosing today based on low BP -  I have asked nutrition to counsel he and his wife on an appropriate protein replacement diet  Hypothermia No clear acute infection - tx w/ bair hugger and follow clinically - temp stable at this time   Chronic diastolic heart failure - Suspected cardiac AL amyloidosis Normal LV fxn on TTE - no new suggestions per Cards - felt to be related to amyloid deposition - as discussed above diuretic strategy will likely need to be  based on weight due to complicating hypotension/intravascular volume depletion - to f/u w/ Dr. Aundra Dubin as outpt after d/c   [facility] Weights   09/30/17 0500 10/03/17 0347 10/06/17 0311  Weight: 65.1 kg (143 lb 8.3 oz) 67.2 kg (148 lb 3.2 oz) 67.1 kg (147 lb 14.9 oz)    Chronic hypotension Maximize midodrine as BP dropping   Persistent atrial fibrillation history of failed DCCV - Xarelto for anticoagulation - rate remains controlled   Nephrotic syndrome secondary to AL amyloidosis - see discussion above   Chronic pain Pain not well controlled at this time, but limited by hypotension   Severe protein calorie malnutrition Due to nephrotic range proteinuria - adjust diet per Nutrition   COPD No acute exacerbation at this time   Anemia with iron and folate deficiency Transfused 2U PRBC this admit - this has helped from an oncotic standpoint, but his Hgb actually never dropped to a point that transfusion is indicated further - Hgb holding steady   Pressure injury of the sacrum Care per WOC protocol  Hematuria and urinary retention Foley cath placed by RN at prior hospital stay - was to f/u as outpt w/ Urology for voiding trial - will proceed w/ voiding trail now w/ d/c of foley today   Chronic B pleural effusion Status post thoracentesis 09/22/2017 - CT chest this admit raises concern for possible loculation on R - not clinically signif at this time  - followed by TCTS as  outpt    DVT prophylaxis: Xarelto  Code Status: DNR - NO CODE  BLUE  Family Communication:  Spoke with wife on phone  Disposition Plan: SDU - possible d/c to SNF for rehab in 48-72hrs depending upon voiding trial and BP   Consultants:  Cardiology   Antimicrobials:  Cefepime 5/18 > 5/19 Vanc 5/18 > 5/19  Objective: Blood pressure (!) 74/53, pulse (!) 117, temperature 97.8 F (36.6 C), temperature source Oral, resp. rate 14, height _0  (1.803 m), weight 67.1 kg (147 lb 14.9 oz), SpO2 (!) 89  %.  Intake/Output Summary (Last 24 hours) at 10/06/2017 1104 Last data filed at 10/06/2017 0818 Gross per 24 hour  Intake 240 ml  Output 2100 ml  Net -1860 ml   Filed Weights   09/30/17 0500 10/03/17 0347 10/06/17 0311  Weight: 65.1 kg (143 lb 8.3 oz) 67.2 kg (148 lb 3.2 oz) 67.1 kg (147 lb 14.9 oz)    Examination: General: No acute respiratory distress - alert/conversant    Lungs: blunting of breath sounds in B bases w/o change - no crackles or wheezing  Cardiovascular: Regular rate w/o M - no rub or gallup  Abdom:  NT/ND, soft, bs+, no mass Ext:  Only trace B LE edema (a singif change)  CBC: Recent Labs  Lab 10/04/17 0349 10/05/17 0401 10/06/17 0324  WBC 6.8 8.6 8.4  HGB 10.8* 11.6* 10.9*  HCT 34.4* 37.3* 34.9*  MCV 100.0 100.8* 102.0*  PLT 169 205 160   Basic Metabolic Panel: Recent Labs  Lab 10/04/17 0349 10/05/17 0401 10/06/17 0324  NA 142 139 143  K 4.1 4.4 4.1  CL 100* 98* 101  CO2 36* 34* 36*  GLUCOSE 127* 126* 118*  BUN 45* 42* 41*  CREATININE 0.85 0.96 0.92  CALCIUM 8.4* 8.3* 8.3*  MG 1.6* 1.6* 1.5*   GFR: Estimated Creatinine Clearance: 66.9 mL/min (by C-G formula based on SCr of 0.92 mg/dL).  Liver Function Tests: Recent Labs  Lab 09/30/17 0552 10/04/17 0349  AST 21  --   ALT 11*  --   ALKPHOS 258*  --   BILITOT 0.5  --   PROT 4.0*  --   ALBUMIN 1.6* 2.1*    Recent Results (from the past 240 hour(s))  Blood culture (routine x 2)     Status: None   Collection Time: 09/27/17  9:52 AM  Result Value Ref Range Status   Specimen Description BLOOD RIGHT CHEST  Final   Special Requests   Final    BOTTLES DRAWN AEROBIC AND ANAEROBIC Blood Culture results may not be optimal due to an excessive volume of blood received in culture bottles   Culture   Final    NO GROWTH 5 DAYS Performed at [facility], [place] [address] [place], [place] [address]    Report Status 10/02/2017 FINAL  Final  Blood culture (routine x 2)     Status: None    Collection Time: 09/27/17 10:30 AM  Result Value Ref Range Status   Specimen Description BLOOD RIGHT HAND  Final   Special Requests   Final    BOTTLES DRAWN AEROBIC AND ANAEROBIC Blood Culture results may not be optimal due to an inadequate volume of blood received in culture bottles   Culture   Final    NO GROWTH 5 DAYS Performed at [facility], [place] [address], [place], [place] [address]    Report Status 10/02/2017 FINAL  Final  MRSA PCR Screening  Status: None   Collection Time: 09/28/17  9:04 AM  Result Value Ref Range Status   MRSA by PCR NEGATIVE NEGATIVE Final    Comment:        The GeneXpert MRSA Assay (FDA approved for NASAL specimens only), is one component of a comprehensive MRSA colonization surveillance program. It is not intended to diagnose MRSA infection nor to guide or monitor treatment for MRSA infections. Performed at [facility], [place] [address], [place], [place] [address]      Scheduled Meds: . acyclovir  400 mg Oral Daily  . dextromethorphan-guaiFENesin  1 tablet Oral BID  . folic acid  1 mg Oral Daily  . furosemide  40 mg Oral BID  . miconazole nitrate  1 application Topical BID  . midodrine  15 mg Oral TID WC  . predniSONE  25 mg Oral Q breakfast  . pregabalin  50 mg Oral BID  . rivaroxaban  20 mg Oral Daily  . senna-docusate  1 tablet Oral BID     LOS: 9 days   Cherene Altes, MD [facility] Office  [phone] Pager - Text Page per Amion as per below:  On-Call/Text Page:      [email or web address]      password [email or web address]  If 7PM-7AM, please contact night-coverage [email or web address] Password [facility] 10/06/2017, 11:04 AM

## 2017-10-07 ENCOUNTER — Inpatient Hospital Stay (HOSPITAL_COMMUNITY): Payer: Medicare Other

## 2017-10-07 LAB — BASIC METABOLIC PANEL
Anion gap: 6 (ref 5–15)
BUN: 44 mg/dL — AB (ref 6–20)
CALCIUM: 8.4 mg/dL — AB (ref 8.9–10.3)
CO2: 36 mmol/L — ABNORMAL HIGH (ref 22–32)
Chloride: 102 mmol/L (ref 101–111)
Creatinine, Ser: 1.33 mg/dL — ABNORMAL HIGH (ref 0.61–1.24)
GFR calc non Af Amer: 51 mL/min — ABNORMAL LOW (ref >60)
GFR, EST AFRICAN AMERICAN: 59 mL/min — AB (ref >60)
Glucose, Bld: 132 mg/dL — ABNORMAL HIGH (ref 65–99)
Potassium: 4.3 mmol/L (ref 3.5–5.1)
SODIUM: 144 mmol/L (ref 135–145)

## 2017-10-07 LAB — CBC
HCT: 38.5 % — ABNORMAL LOW (ref 39.0–52.0)
Hemoglobin: 11.9 g/dL — ABNORMAL LOW (ref 13.0–17.0)
MCH: 31.9 pg (ref 26.0–34.0)
MCHC: 30.9 g/dL (ref 30.0–36.0)
MCV: 103.2 fL — ABNORMAL HIGH (ref 78.0–100.0)
PLATELETS: 202 10*3/uL (ref 150–400)
RBC: 3.73 MIL/uL — AB (ref 4.22–5.81)
RDW: 17.1 % — AB (ref 11.5–15.5)
WBC: 8.9 10*3/uL (ref 4.0–10.5)

## 2017-10-07 LAB — MAGNESIUM: Magnesium: 2 mg/dL (ref 1.7–2.4)

## 2017-10-07 MED ORDER — SIMETHICONE 80 MG PO CHEW
160.0000 mg | CHEWABLE_TABLET | Freq: Four times a day (QID) | ORAL | Status: DC | PRN
  Administered 2017-10-07: 160 mg via ORAL
  Filled 2017-10-07: qty 2

## 2017-10-07 MED ORDER — FENTANYL CITRATE (PF) 100 MCG/2ML IJ SOLN
12.5000 ug | INTRAMUSCULAR | Status: DC | PRN
  Administered 2017-10-07 – 2017-10-09 (×5): 12.5 ug via INTRAVENOUS
  Filled 2017-10-07 (×5): qty 2

## 2017-10-07 MED ORDER — BETHANECHOL CHLORIDE 10 MG PO TABS
10.0000 mg | ORAL_TABLET | Freq: Three times a day (TID) | ORAL | Status: DC
  Administered 2017-10-07 – 2017-10-09 (×6): 10 mg via ORAL
  Filled 2017-10-07 (×8): qty 1

## 2017-10-07 NOTE — Progress Notes (Signed)
Nutrition Follow Up  DOCUMENTATION CODES:   Non-severe (moderate) malnutrition in context of chronic illness  INTERVENTION:    D/C 48 hr calorie count  D/C Boost Breeze supplements  Magic cup po TID with meals, each supplement provides 290 kcal and 9 gm of protein  NUTRITION DIAGNOSIS:   Moderate Malnutrition related to chronic illness(malignant lymphoplasmayctic lymphoma, COPD, CHF) as evidenced by moderate fat depletion, severe fat depletion, ongoing  GOAL:   Patient will meet greater than or equal to 90% of their needs, progressing  MONITOR:   PO intake, Supplement acceptance, Weight trends, Labs, I & O's, Skin  ASSESSMENT:   72 y.o. 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   48 calorie count ordered 5/26.  Percent consumed of food items not documented on meal tickets. RD unable to assess.  Pt eating a piece of red velvet cake upon RD visit. He is really enjoying his Magic Cup dessert supplements. Not drinking Boost Breeze now b/c it's "too sweet". Labs and medications reviewed.   Pt's wife inquired about pt being weighed. Notified RN. She wants to know if they are "making progress". Palliative Medicine Team note reviewed.  Diet Order:    Diet Order           Diet regular Room service appropriate? Yes; Fluid consistency: Thin  Diet effective now         EDUCATION NEEDS:   No education needs have been identified at this time  Skin:  Skin Assessment: Skin Integrity Issues: Skin Integrity Issues:: Stage I, Stage II Stage I:  coccyx Stage II: sacrum  Last BM:  5/26   Intake/Output Summary (Last 24 hours) at 10/07/2017 1537 Last data filed at 10/07/2017 1420 Gross per 24 hour  Intake 480 ml  Output 1659 ml  Net -1179 ml    Height:   Ht Readings from Last 1 Encounters:  09/28/17 _0  (1.803 m)   Weight:   Wt Readings from Last 1 Encounters:  10/06/17 143 lb 4.8 oz (65 kg)   Ideal Body Weight:  67.2 kg  BMI:  Body mass index is 19.99 kg/m.  Estimated Nutritional Needs:   Kcal:  2000-2200  Protein:  105-115 gm  Fluid:  2.0-2.2 L  Arthur Holms, RD, LDN Pager #: [phone] After-Hours Pager #: [phone]

## 2017-10-07 NOTE — Progress Notes (Signed)
Noted increased pain overnight.  Patient reports that he was unable to urinate overnight.  He has urinated this morning.  We discussed his pain.  He states everything is painful to him - bathing, repositioning, etc.    We chatted.  He is in a very good frame of mind.  Looking forward to discharge.  Hopeful for improvement.  States chatting with his wife helps him reduce his pain.  Assessment:  72 yo male with strong will to live and a positive attitude.  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  This has lead to recurrent pleural effusions and CHF.  He is attempting to improve his nutritional status via diet.  At this point he is bed bound but will assist with his own bed mobility.  Recommendation:   Pt is excellent about attempting to control pain with non-narcotic measures.   If narcotics are needed may consider fentanyl rather than morphine.  Due to BP and renal function.  Perhaps if he tolerates PRN doses of fentanyl he could tolerate a low dose transdermal patch.  PMT is available as needed.  Please call if patient has an adverse event or begins to decline more rapidly.  Florentina Jenny, PA-C Palliative Medicine Pager: [phone]  15 min.

## 2017-10-07 NOTE — Progress Notes (Signed)
Physical Therapy Treatment Patient Details Name: [name] MRN: [id number] DOB: [birth year] Today's Date: 10/07/2017    History of Present Illness Pt is a 72 y.o. 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 Pt was only home 12 hours after d/c from [facility] on 5/17.    PT Comments    Pt slowly progressing mobility. Today's session focused on increased participation with bed mobility, ROM, and tolerance to upright posture, as pt continues to have very low BP values. Pt requires step-by-step cues for sequencing to maximize amount of assist he is able to provide with repositioning; reports 3-4/10 at baseline "all over" that increases with any movement. Focused on AROM/PROM in bilateral knees, L shoulder and cervical rotation. Bed left in chair position for pt to eat. Will continue to follow acutely and plan for OOB transfer next session pending pt progression.  Supine BP 91/64 After bed level activity BP 88/67 HOB raised to chair position BP 93/69 SpO2 94% on 8L O2 [place]    Follow Up Recommendations  SNF     Equipment Recommendations  None recommended by PT    Recommendations for Other Services       Precautions / Restrictions Precautions Precautions: Fall Precaution Comments: Pain all over, monitor VS, BP low; sacral wound Restrictions Weight Bearing Restrictions: No    Mobility  Bed Mobility Overal bed mobility: Needs Assistance Bed Mobility: Rolling Rolling: Mod assist;Max assist         General bed mobility comments: Pt required step-by-step instructions for repositioning in bed, including reaching hands to hand rail (cues to look over at rail when pt reaching up in the air for it, but pt reports  unable to turn head), bending knees, and how to provide assist. Able to scoot up in bed with mod-maxA (in [place] position). Pt with drop in BP with HOB elevated  Transfers                 General transfer comment: Deferred secondary to low BP with HOB elevated  Ambulation/Gait                 Stairs             Wheelchair Mobility    Modified Rankin (Stroke Patients Only)       Balance                                            Cognition Arousal/Alertness: Awake/alert Behavior During Therapy: Flat affect Overall Cognitive Status: Impaired/Different from baseline Area of Impairment: Attention;Following commands;Problem solving                   Current Attention Level: Selective   Following Commands: Follows multi-step commands with increased time     Problem Solving: Slow processing;Difficulty sequencing;Requires verbal cues        Exercises Other Exercises Other Exercises: Bilat knee flexion/heel slides, L shoulder/elbow PROM, L cervical rotation stretch    General Comments General comments (skin integrity, edema, etc.): Wife present throughout session      Pertinent Vitals/Pain Pain Assessment: 0-10 Pain Score: 6  Pain Location: All over ("I hang out at a 4-6/10") Pain  Descriptors / Indicators: Discomfort;Grimacing;Moaning Pain Intervention(s): Monitored during session;Repositioned;Limited activity within patient's tolerance    Home Living                      Prior Function            PT Goals (current goals can now be found in the care plan section) Acute Rehab PT Goals Patient Stated Goal: Get some strength back; feel comfortable PT Goal Formulation: With patient Time For Goal Achievement: 10/17/17 Potential to Achieve Goals: Fair Progress towards PT goals: Progressing toward goals    Frequency    Min 2X/week      PT Plan Current plan remains appropriate    Co-evaluation               AM-PAC PT "6 Clicks" Daily Activity  Outcome Measure  Difficulty turning over in bed (including adjusting bedclothes, sheets and blankets)?: Unable Difficulty moving from lying on back to sitting on the side of the bed? : Unable Difficulty sitting down on and standing up from a chair with arms (e.g., wheelchair, bedside commode, etc,.)?: Unable Help needed moving to and from a bed to chair (including a wheelchair)?: Total Help needed walking in hospital room?: Total Help needed climbing 3-5 steps with a railing? : Total 6 Click Score: 6    End of Session Equipment Utilized During Treatment: Oxygen Activity Tolerance: Treatment limited secondary to medical complications (Comment);Patient limited by fatigue;Patient limited by pain Patient left: in bed;with call bell/phone within reach;with family/visitor present Nurse Communication: Mobility status PT Visit Diagnosis: Other abnormalities of gait and mobility (R26.89);Pain     Time: 1460-4799 PT Time Calculation (min) (ACUTE ONLY): 25 min  Charges:  $Therapeutic Activity: 23-37 mins                    G Codes:      Mabeline Caras, PT, DPT Acute Rehab Services  Pager: [place] 10/07/2017, 12:09 PM

## 2017-10-07 NOTE — Progress Notes (Signed)
Pain continued to be uncontrolled through the night, low blood pressure limited PRN medications able to give .

## 2017-10-07 NOTE — Progress Notes (Signed)
[name] - Stepdown/ICU TEAM  [name]  XKG:[id number] DOB: [birth year] DOA: 09/27/2017 PCP: Biagio Borg, MD    Brief Narrative:  72 y.o. 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   Patient was discharged from [facility] the evening prior to his return to the ED. During that admission he diuresed 7 L. He was sent home on 4L [place], increased from his prior 3L.    Significant Events: 5/17 d/c from [facility] 5/18 admit to [facility]   Subjective: The patient is resting comfortably in bed at this time.  He does suffer with intermittent episodes of pain which had been difficult to control in the setting of hypotension.  He denies shortness of breath fevers chills nausea or vomiting.  His peripheral edema has significantly improved.  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 - there is no short term fix for this situation - ongoing volume overload is expected - attempts to diurese aggressively will likely lead to poor organ perfusion and worsening of chronic hypotension - I have discussed this with he and his wife at length and attempted to explain the reasoning - he will likely need a when necessary Lasix dosing strategy based on his weight and not a standard scheduled dose - he is currently stable w/o diuretic - follow clinically   Hypothermia No clear acute infection - tx w/ bair hugger prn and follow clinically - temp stable at this time   Chronic diastolic heart failure - 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 - see discussion above   Chronic pain Pain not well controlled at this time, but limited by hypotension - give trial of fentanyl IV and if tolerates w/o drop in BP will consider low dose patch   Severe protein calorie malnutrition Due to nephrotic range proteinuria - diet per Nutrition   COPD No acute exacerbation at this time   Anemia with iron and folate deficiency Transfused 2U PRBC this admit - this has helped from an oncotic standpoint, but his Hgb actually never dropped to a point that transfusion is indicated further - Hgb holding steady   Pressure injury of the sacrum Care per WOC protocol  Hematuria and urinary retention Foley cath placed by RN at prior hospital stay - was to f/u as outpt w/ Urology for voiding trial -  foley removed 5/27 w/ pt suffering multiple recurrent episodes of retention - replace foley and begin trial of urecholine - remove foley again after 24-48hrs for another trial    Chronic B pleural effusion Status post thoracentesis 09/22/2017 - CT chest this admit raises concern for possible loculation on R - not clinically signif at this time  - followed by [facility]CTS as outpt    DVT prophylaxis: Xarelto  Code Status: DNR - NO CODE  BLUE  Family Communication:  Spoke with wife at bedside    Disposition Plan: SDU - possible d/c to SNF for rehab in 48-72hrs depending upon voiding trial and BP   Consultants:  Cardiology   Antimicrobials:  Cefepime 5/18 > 5/19 Vanc 5/18 > 5/19  Objective: Blood pressure 102/74, pulse 80, temperature 98.6 F (37 C), resp. rate 14, height _0  (1.803 m), weight 65 kg (143 lb 4.8 oz), SpO2 96 %.  Intake/Output Summary (Last 24 hours) at 10/07/2017 1714 Last data filed at 10/07/2017 1430 Gross per 24 hour  Intake 720 ml  Output 1659 ml  Net -939 ml   Filed Weights   10/03/17 0347 10/06/17 0311 10/06/17 2353  Weight: 67.2 kg (148 lb 3.2 oz) 67.1 kg (147 lb 14.9 oz) 65 kg (143 lb 4.8 oz)    Examination: General: No acute respiratory distress - alert/conversant and quite pleasant    Lungs: blunting of breath sounds in B bases - no wheeze  Cardiovascular: Regular rate w/o M   Abdom:  NT/ND, soft, bs+, no mass Ext:  No signif edema B LE (a major change since admit)  CBC: Recent Labs  Lab 10/05/17 0401 10/06/17 0324 10/07/17 0420  WBC 8.6 8.4 8.9  HGB 11.6* 10.9* 11.9*  HCT 37.3* 34.9* 38.5*  MCV 100.8* 102.0* 103.2*  PLT 205 181 122   Basic Metabolic Panel: Recent Labs  Lab 10/05/17 0401 10/06/17 0324 10/07/17 0420  NA 139 143 144  K 4.4 4.1 4.3  CL 98* 101 102  CO2 34* 36* 36*  GLUCOSE 126* 118* 132*  BUN 42* 41* 44*  CREATININE 0.96 0.92 1.33*  CALCIUM 8.3* 8.3* 8.4*  MG 1.6* 1.5* 2.0   GFR: Estimated Creatinine Clearance: 44.8 mL/min (A) (by C-G formula based on SCr of 1.33 mg/dL (H)).  Liver Function Tests: Recent Labs  Lab 10/04/17 0349  ALBUMIN 2.1*    Recent Results (from the past 240 hour(s))  MRSA PCR Screening     Status: None   Collection Time: 09/28/17  9:04 AM  Result Value Ref Range Status   MRSA by PCR NEGATIVE NEGATIVE Final    Comment:        The GeneXpert MRSA Assay (FDA approved for NASAL specimens only), is one component of a comprehensive MRSA colonization surveillance program. It  is not intended to diagnose MRSA infection nor to guide or monitor treatment for MRSA infections. Performed at [facility], [place] [address], [place], [place] [address]      Scheduled Meds: . acyclovir  400 mg Oral Daily  . dextromethorphan-guaiFENesin  1 tablet Oral BID  . folic acid  1 mg Oral Daily  . miconazole nitrate  1 application Topical BID  . midodrine  15 mg Oral TID WC  . predniSONE  25 mg Oral Q breakfast  . pregabalin  50 mg Oral BID  . rivaroxaban  20 mg Oral Daily  . senna-docusate  1 tablet Oral BID     LOS: 10 days   Dellis Filbert  Hennie Duos, MD [facility] Office  [phone] Pager - Text Page per Amion as per below:  On-Call/Text Page:      [email or web address]      password [email or web address]  If 7PM-7AM, please contact night-coverage [email or web address] Password [email or web address] 10/07/2017, 5:14 PM

## 2017-10-08 LAB — BASIC METABOLIC PANEL
Anion gap: 5 (ref 5–15)
BUN: 45 mg/dL — AB (ref 6–20)
CHLORIDE: 102 mmol/L (ref 101–111)
CO2: 36 mmol/L — ABNORMAL HIGH (ref 22–32)
CREATININE: 0.91 mg/dL (ref 0.61–1.24)
Calcium: 8.3 mg/dL — ABNORMAL LOW (ref 8.9–10.3)
Glucose, Bld: 85 mg/dL (ref 65–99)
Potassium: 4.1 mmol/L (ref 3.5–5.1)
SODIUM: 143 mmol/L (ref 135–145)

## 2017-10-08 LAB — CBC
HCT: 36.8 % — ABNORMAL LOW (ref 39.0–52.0)
Hemoglobin: 11.5 g/dL — ABNORMAL LOW (ref 13.0–17.0)
MCH: 32 pg (ref 26.0–34.0)
MCHC: 31.3 g/dL (ref 30.0–36.0)
MCV: 102.5 fL — ABNORMAL HIGH (ref 78.0–100.0)
PLATELETS: 204 10*3/uL (ref 150–400)
RBC: 3.59 MIL/uL — AB (ref 4.22–5.81)
RDW: 16.9 % — AB (ref 11.5–15.5)
WBC: 8.2 10*3/uL (ref 4.0–10.5)

## 2017-10-08 NOTE — Progress Notes (Signed)
Occupational Therapy Treatment Patient Details Name: [name] MRN: [id number] DOB: [birth year] Today's Date: 10/08/2017    History of present illness Pt is a 72 y.o. 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 Pt was only home 12 hours after d/c from [facility] on 5/17.    OT comments  Pt progressing towards OT goals this session, decreased BUE pain and able to perform multiple transfers for toileting as well as maintain standing for rear peri care. Pt able to maintain min guard assist sitting EOB (deflated mattress) for grooming tasks. Pt asymptomatic throughout session, although decreased BP noted post-transfer (see values below). SpO2 >88% on 8L O2 [place]. Pt really enjoys therapy sessions and is highly motivated to return to piano playing. OT will continue to follow acutely.   Supine BP 92/63 Seated BP 93/67 Post-transfer BP 65/49 Legs up ~3 min sitting BP 74/62  Follow Up Recommendations  SNF;Supervision/Assistance - 24 hour    Equipment Recommendations  None recommended by OT    Recommendations for Other Services      Precautions / Restrictions Precautions Precautions: Fall Precaution Comments: Pain all over, monitor VS, BP low; sacral wound Restrictions Weight Bearing Restrictions: No       Mobility Bed Mobility Overal bed mobility: Needs Assistance Bed Mobility: Supine to Sit     Supine to sit: Mod assist;+2 for physical assistance     General bed mobility comments: Good ability to walk BUEs to EOB; modA+2 to assist trunk elevation and assist hips to EOB with bed pad - more successful with bed deflated  Transfers Overall transfer level: Needs assistance Equipment used: 2 person hand held  assist Transfers: Sit to/from Stand Sit to Stand: Mod assist;+2 physical assistance Stand pivot transfers: Mod assist;+2 physical assistance;+2 safety/equipment;From elevated surface       General transfer comment: Pt stood from bed and 2x from recliner with bilat HHA and modA+2, requiring decreased assist each trial    Balance Overall balance assessment: Needs assistance Sitting-balance support: No upper extremity supported;Feet supported Sitting balance-Leahy Scale: Poor Sitting balance - Comments: Reliant on BUE support   Standing balance support: Bilateral upper extremity supported Standing balance-Leahy Scale: Poor Standing balance comment: able to stand with BUE support on RW and minA for balance; dependent for pericare while standing                           ADL either performed or assessed with clinical judgement   ADL Overall ADL's : Needs assistance/impaired     Grooming: Set up;Sitting;Wash/dry hands;Wash/dry face Grooming Details (indicate cue type and reason): in recliner                 Toilet Transfer: Moderate assistance;+2 for safety/equipment;Stand-pivot;RW Toilet Transfer Details (indicate cue type and reason): simulated through recliner transfer [place] and Hygiene: Total assistance;+2 for physical assistance;Sit to/from stand Toileting - Clothing Manipulation Details (indicate cue type and reason): able to stand and use RW for support with rear peri care       General ADL Comments: Pt able to perform 3 sit <>stand from bed initially, then [place]  Arousal/Alertness: Awake/alert Behavior During Therapy: WFL for tasks assessed/performed Overall Cognitive Status: Impaired/Different from baseline Area of Impairment: Attention;Problem solving                   Current Attention Level: Selective         Problem Solving: Slow  processing;Difficulty sequencing;Requires verbal cues          Exercises     Shoulder Instructions       General Comments Wife present throughout session    Pertinent Vitals/ Pain       Pain Assessment: Faces Faces Pain Scale: Hurts even more Pain Location: Generalized, especially back and sacrum Pain Descriptors / Indicators: Discomfort;Grimacing;Moaning Pain Intervention(s): Monitored during session;Repositioned;Other (comment)(built up chair with pillows for sacral wound)  Home Living                                          Prior Functioning/Environment              Frequency  Min 2X/week        Progress Toward Goals  OT Goals(current goals can now be found in the care plan section)  Progress towards OT goals: Progressing toward goals  Acute Rehab OT Goals Patient Stated Goal: Get some strength back; feel comfortable OT Goal Formulation: With patient/family Time For Goal Achievement: 10/17/17 Potential to Achieve Goals: [place] Discharge plan remains appropriate;Frequency remains appropriate    Co-evaluation    PT/OT/SLP Co-Evaluation/Treatment: Yes Reason for Co-Treatment: Complexity of the patient's impairments (multi-system involvement);For patient/therapist safety;To address functional/ADL transfers PT goals addressed during session: Mobility/safety with mobility;Balance;Proper use of DME;Strengthening/ROM OT goals addressed during session: ADL's and self-care;Proper use of Adaptive equipment and DME;Strengthening/ROM      AM-PAC PT "6 Clicks" Daily Activity     Outcome Measure   Help from another person eating meals?: None Help from another person taking care of personal grooming?: A Little Help from another person toileting, which includes using toliet, bedpan, or urinal?: A Lot Help from another person bathing (including washing, rinsing, drying)?: A Lot Help from another person to put on and taking off regular upper  body clothing?: A Lot Help from another person to put on and taking off regular lower body clothing?: A Lot 6 Click Score: 15    End of Session Equipment Utilized During Treatment: Oxygen(8L)  OT Visit Diagnosis: Muscle weakness (generalized) (M62.81);Pain;Other abnormalities of gait and mobility (R26.89);Other symptoms and signs involving cognitive function Pain - part of body: (back)   Activity Tolerance Patient tolerated treatment well   Patient Left in chair;with call bell/phone within reach;with family/visitor present   Nurse Communication Mobility status;Precautions        Time: 2820-6015 OT Time Calculation (min): 43 min  Charges: OT General Charges $OT Visit: 1 Visit OT Treatments $Self Care/Home Management : 8-22 mins  Hulda Humphrey OTR/L [place] 10/08/2017, 3:11 PM

## 2017-10-08 NOTE — Progress Notes (Signed)
Physical Therapy Treatment Patient Details Name: [name] MRN: [id number] DOB: Jun 19, 1945 Today's Date: 10/08/2017    History of Present Illness Pt is a 72 y.o. 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 Pt was only home 12 hours after d/c from [facility] on 5/17.    PT Comments    Pt progressing with mobility. Able to stand and take steps to chair with modA+2; requires assist to maintain trunk elevation, cue bilateral hip extension, and prevent bilat knee instability. Pt asymptomatic throughout session, although decreased BP noted post-transfer (see values below). SpO2 >88% on 8L O2 [place]. Repositioned in chair sitting in pillows due to sacral soreness; educ to sit <2 hrs secondary to sacral wound. Will follow acutely.  Supine BP 92/63 Seated BP 93/67 Post-transfer BP 65/49 Legs up ~3 min sitting BP 74/62    Follow Up Recommendations  SNF     Equipment Recommendations  None recommended by PT    Recommendations for Other Services       Precautions / Restrictions Precautions Precautions: Fall Precaution Comments: Pain all over, monitor VS, BP low; sacral wound Restrictions Weight Bearing Restrictions: No    Mobility  Bed Mobility Overal bed mobility: Needs Assistance       Supine to sit: Mod assist;+2 for physical assistance     General bed mobility comments: Good ability to walk BUEs to EOB; modA+2 to assist trunk elevation and assist hips to EOB with bed pad  Transfers Overall transfer level: Needs assistance Equipment used: 2 person hand held assist Transfers: Sit to/from Stand Sit to Stand: Mod assist;+2 physical assistance         General transfer comment: Pt stood from bed and 2x from  recliner with bilat HHA and modA+2, requiring decreased assist each trial  Ambulation/Gait Ambulation/Gait assistance: Mod assist;+2 physical assistance Ambulation Distance (Feet): 2 Feet Assistive device: 2 person hand held assist Gait Pattern/deviations: Step-to pattern Gait velocity: Decreased Gait velocity interpretation: <1.31 ft/sec, indicative of household ambulator General Gait Details: Took steps from bed to recliner with modA+2 to maintain trunk elevation, cue hip extension, and prevent bilat knee buckling   Stairs             Wheelchair Mobility    Modified Rankin (Stroke Patients Only)       Balance Overall balance assessment: Needs assistance Sitting-balance support: No upper extremity supported;Feet supported Sitting balance-Leahy Scale: Poor Sitting balance - Comments: Reliant on BUE support; able to stand with BUE support on RW and minA for balance; dependent for pericare while standing                                    Cognition Arousal/Alertness: Awake/alert Behavior During Therapy: WFL for tasks assessed/performed Overall Cognitive Status: Impaired/Different from baseline Area of Impairment: Attention;Problem solving                   Current Attention Level: Selective         Problem Solving: Slow processing;Difficulty sequencing;Requires verbal cues        Exercises      General Comments General comments (skin integrity, edema, etc.): Wife present throughout session      Pertinent Vitals/Pain Pain Assessment: Faces  Faces Pain Scale: Hurts even more Pain Location: Generalized, especially back and sacrum Pain Descriptors / Indicators: Discomfort;Grimacing;Moaning Pain Intervention(s): Monitored during session;Limited activity within patient's tolerance;Repositioned    Home Living                      Prior Function            PT Goals (current goals can now be found in the care plan section)  Acute Rehab PT Goals Patient Stated Goal: Get some strength back; feel comfortable PT Goal Formulation: With patient Time For Goal Achievement: 10/17/17 Potential to Achieve Goals: Fair Progress towards PT goals: Progressing toward goals    Frequency    Min 2X/week      PT Plan Current plan remains appropriate    Co-evaluation PT/OT/SLP Co-Evaluation/Treatment: Yes Reason for Co-Treatment: Complexity of the patient's impairments (multi-system involvement);For patient/therapist safety;To address functional/ADL transfers PT goals addressed during session: Mobility/safety with mobility;Balance;Proper use of DME        AM-PAC PT "6 Clicks" Daily Activity  Outcome Measure  Difficulty turning over in bed (including adjusting bedclothes, sheets and blankets)?: Unable Difficulty moving from lying on back to sitting on the side of the bed? : Unable Difficulty sitting down on and standing up from a chair with arms (e.g., wheelchair, bedside commode, etc,.)?: Unable Help needed moving to and from a bed to chair (including a wheelchair)?: A Lot Help needed walking in hospital room?: A Lot Help needed climbing 3-5 steps with a railing? : Total 6 Click Score: 8    End of Session Equipment Utilized During Treatment: Gait belt;Oxygen Activity Tolerance: Patient tolerated treatment well Patient left: in chair;with call bell/phone within reach;with family/visitor present Nurse Communication: Mobility status PT Visit Diagnosis: Other abnormalities of gait and mobility (R26.89);Pain     Time: 1127-1207 PT Time Calculation (min) (ACUTE ONLY): 40 min  Charges:  $Therapeutic Activity: 23-37 mins                    G Codes:      Mabeline Caras, PT, DPT Acute Rehab Services  Pager: [place] 10/08/2017, 12:42 PM

## 2017-10-08 NOTE — Progress Notes (Signed)
PROGRESS NOTE    [name]  XNT:[id number] DOB: [birth year] DOA: 09/27/2017 PCP: Biagio Borg, MD   Brief Narrative:  72 y.o.WM PMHx  Chronic diastolic CHF (EF of 96%), atrial fibrillation on Xarelto, COPD on home oxygen at 3 L, malignant lymphoplasmayctic lymphoma, and a hospitalization for acute on chronic heart failure secondary to cardiac amyloidosis 09/18/17 > 09/26/17   Presented to the ED w/ recurrent hypoxic respiratory failure with multifactorial etiology.    Patient was discharged from [facility] the evening prior to his return to the ED. During that admission he diuresed 7 L. He was sent home on 4L [place], increased from his prior 3L.        Subjective: 5/29 somnolent but arousable.  A/O x4.  Wife states he did not sleep well overnight.  Negative CP, negative abdominal pain.  Positive acute on chronic S OB.      Assessment & Plan:   Principal Problem:   Acute-on-chronic respiratory failure (HCC) Active Problems:   Bilateral pleural effusion   COPD mixed type (HCC)   Protein-calorie malnutrition, severe (HCC)   Loculated pleural effusion   Empyema (HCC)   Persistent atrial fibrillation (HCC)   Acute on chronic combined systolic and diastolic CHF (congestive heart failure) (HCC)   Pulmonary HTN (HCC)   Aspiration pneumonia (HCC)   Hypoalbuminemia   Hypotension   Elevated brain natriuretic peptide (BNP) level   Nephrotic syndrome   Elevated troponin   Anemia   Lethargy   Failure to thrive in adult   Acute on chronic congestive heart failure (HCC)  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 Brunetta Genera - Per patient and wife completed chemotherapy ( Bendamustine/Rituxan).  Has completed 6 cycles (last cycle in April): Partial response PER oncology note.  -Extensive conversation with patient and wife explaining that amyloidosis is not reversible, although chemotherapy may slow  further deposition, secondary to omission of lymphoma..  IgG kappa MGUS  -Per Dr. Grier Mitts oncology note patient also diagnosed with MGUS.  No overt evidence of cardiac involvement based on ECHO   Anasarca - Secondary to low oncotic state.  Albumin 1.6. -Cannot aggressively diuresis as this will lead to hypoperfusion and worsening of endorgan function. -See CHF   Hypothermia -Negative indication of infection, most likely secondary to poor nutritional status.   -Continue bear hugger PRN   Chronic diastolic CHF -Believe secondary to cardiac AL amyloidosis.  No new suggestions per cardiology -Strict in and out since admission -9.1 L - Daily weight Filed Weights   10/06/17 0311 10/06/17 2353 10/08/17 0421  Weight: 147 lb 14.9 oz (67.1 kg) 143 lb 4.8 oz (65 kg) 145 lb 1 oz (65.8 kg)  -Lasix 40 mg BID - Diuretic strategy will be based on patient's weight.  Will need to adjust Lasix dosing dependent on daily weight.  Dr. Thereasa Solo addressed this issue with patient and wife however not sure they will be able to follow this complicated regimen.  Will most likely need SNF placement or daily home health aide to assist them - Follow-up with Dr. Aundra Dubin as outpatient  Chronic hypotension - Midodrine 10 mg TID  Persistent atrial fibrillation -History of failed DCCV -Currently rate controlled.Hx  failed DCCV  -On Xarelto for anticoagulation  Acute on chronic respiratory failure with hypoxia/Emphysema -Multifactorial severe emphysema, bilateral pleural effusion (developing empyema RIGHT?),  Pneumonia? -Continue to treat underlying problems, mild improvement. - 5/26 begin steroid taper.  Decrease Prednisone  25 mg daily -Flutter valve -DuoNeb QID - Mucinex DM - Counseled patient and wife that we will start physical therapy vest if patient continues to have problems clearing lung secretions.  Chronic Bilateral pleural Effusion/ RIGHT Empyema -Patient seen by Dr. Tharon Aquas [facility] cardiothoracic  surgery 12/11/2016 as outpatient follow-up for RIGHT sided VATS procedure.  At that time no further surgical intervention required. -5/13 S/P thoracentesis (previous admission) - LEFT pleural effusion appears >> RIGHT effusion.  COPD  -By CT angiogram severe COPD - See respiratory failure   Nephrotic syndrome (by biopsy) -Secondary to AL amyloidosis - see discussion above  Recent Labs  Lab 10/02/17 0420 10/03/17 0517 10/04/17 0349 10/05/17 0401 10/06/17 0324 10/07/17 0420 10/08/17 0321  CREATININE 1.05 1.05 0.85 0.96 0.92 1.33* 0.91   Chronic pain -Chronic back pain evaluated by MRI T-spine on 03/26/2017- for metastatic disease or fracture - Currently comfortable.    Severe protein calorie malnutrition -Calorie count in progress -If patient not consuming adequate calories will start on appetite stimulant: Megace? -Adjust diet per nutrition    Iron deficiency anemia/folate deficiency anemia - Transfuse 2 units PRBC   Pressure injury of sacrum -Care per WOC protocol   Hematuria and urinary retention Foley cath to remain in place        DVT prophylaxis: Xarelto Code Status: DNR Family Communication: At bedside for discussion of plan of care Disposition Plan: TBD   Consultants:  Cardiology     Procedures/Significant Events:  5/10 echocardiogram:Left ventricle:/Incomplete study secondary to patient's discomfort.  The cavity size was normal.  moderate LVH. LVEF= 60% to 65%.-- The study is not technically sufficient to   allow evaluation of LV diastolic function. - Left atrium: Severely dilated. ------------------------------------------------------------------------------------------------------------------------  5/17 d/c from [facility] 5/18 admit to [facility]  5/18 CT angiogram PE protocol:-Negative PE - Emphysema -Worsening dependent airspace disease and pleural effusions throughout all lobes, favored to represent sequela of aspiration and aspiration  pneumonia/pneumonitis.  -evidence of partial loculation of right-sided pleural fluid, potentially representing developing empyema versus complex pleural fluid. -Reactive mediastinal lymphadenopathy. Partially calcified lymph nodes of the mediastinum, compatible with prior granulomatous disease. 5/22 PCXR:-RIGHT chest wall port.-Changes of prior granulomatous disease with multiple calcified lymph nodes are again seen. Diffuse airspace opacity is again identified RIGHT>>LEFT stable - Bilateral pleural effusion     I have personally reviewed and interpreted all radiology studies and my findings are as above.  VENTILATOR SETTINGS:    Cultures   Antimicrobials: Anti-infectives (From admission, onward)   Start     Stop   09/28/17 1000  acyclovir (ZOVIRAX) tablet 400 mg         09/27/17 2300  vancomycin (VANCOCIN) IVPB 750 mg/150 ml premix  Status:  Discontinued     09/27/17 1122   09/27/17 2300  vancomycin (VANCOCIN) 500 mg in sodium chloride 0.9 % 100 mL IVPB  Status:  Discontinued     09/28/17 1529   09/27/17 1800  ceFEPIme (MAXIPIME) 1 g in sodium chloride 0.9 % 100 mL IVPB  Status:  Discontinued     09/28/17 1529   09/27/17 0945  vancomycin (VANCOCIN) IVPB 1000 mg/200 mL premix     09/27/17 1230   09/27/17 0945  ceFEPIme (MAXIPIME) 2 g in sodium chloride 0.9 % 100 mL IVPB     09/27/17 1111       Devices    LINES / TUBES:      Continuous Infusions:    Objective: Vitals:   10/08/17 0745 10/08/17 0800  10/08/17 1444 10/08/17 1635  BP: 103/76 97/82 105/68 100/76  Pulse: (!) 59 73 73 66  Resp: _0 Temp: 98 F (36.7 C)   97.6 F (36.4 C)  TempSrc: Oral   Oral  SpO2: 97% 95% 94% 100%  Weight:      Height:        Intake/Output Summary (Last 24 hours) at 10/08/2017 1905 Last data filed at 10/08/2017 1425 Gross per 24 hour  Intake 360 ml  Output 750 ml  Net -390 ml   Filed Weights   10/06/17 0311 10/06/17 2353 10/08/17 0421  Weight: 147 lb 14.9 oz  (67.1 kg) 143 lb 4.8 oz (65 kg) 145 lb 1 oz (65.8 kg)    Physical Exam:   General: A/O x 4 , Acute on Chronic respiratory distress ( improved), cachectic Neck:  Negative scars, masses, torticollis, lymphadenopathy, JVD Lungs: decreased breath sounds diffusely, LEFT>>> RIGHT.    Mild positive rhonchi (improving), negative  wheezes or crackles Cardiovascular: Irregular regular rhythm and rate, negative murmur, gallop.  Negative rub, normal S1 and S2 Abdomen: negative abdominal pain, nondistended, positive soft, bowel sounds, no rebound, no ascites, no appreciable mass Extremities: No significant cyanosis, clubbing, or edema bilateral lower extremities Skin: Negative rashes, lesions, ulcers Psychiatric:  Negative depression, negative anxiety, negative fatigue, negative mania  Central nervous system:  Cranial nerves II through XII intact, tongue/uvula midline, all extremities muscle strength 5/5, sensation intact throughout, negative dysarthria, negative expressive aphasia, negative receptive aphasia.    .     Data Reviewed: Care during the described time interval was provided by me .  I have reviewed this patient's available data, including medical history, events of note, physical examination, and all test results as part of my evaluation.   CBC: Recent Labs  Lab 10/04/17 0349 10/05/17 0401 10/06/17 0324 10/07/17 0420 10/08/17 0321  WBC 6.8 8.6 8.4 8.9 8.2  HGB 10.8* 11.6* 10.9* 11.9* 11.5*  HCT 34.4* 37.3* 34.9* 38.5* 36.8*  MCV 100.0 100.8* 102.0* 103.2* 102.5*  PLT 169 205 181 202 161   Basic Metabolic Panel: Recent Labs  Lab 10/03/17 0517 10/04/17 0349 10/05/17 0401 10/06/17 0324 10/07/17 0420 10/08/17 0321  NA 142 142 139 143 144 143  K 4.9 4.1 4.4 4.1 4.3 4.1  CL 100* 100* 98* 101 102 102  CO2 34* 36* 34* 36* 36* 36*  GLUCOSE 118* 127* 126* 118* 132* 85  BUN 41* 45* 42* 41* 44* 45*  CREATININE 1.05 0.85 0.96 0.92 1.33* 0.91  CALCIUM 8.2* 8.4* 8.3* 8.3* 8.4*  8.3*  MG 1.6* 1.6* 1.6* 1.5* 2.0  --    GFR: Estimated Creatinine Clearance: 66.3 mL/min (by C-G formula based on SCr of 0.91 mg/dL). Liver Function Tests: Recent Labs  Lab 10/04/17 0349  ALBUMIN 2.1*   No results for input(s): LIPASE, AMYLASE in the last 168 hours. No results for input(s): AMMONIA in the last 168 hours. Coagulation Profile: No results for input(s): INR, PROTIME in the last 168 hours. Cardiac Enzymes: No results for input(s): CKTOTAL, CKMB, CKMBINDEX, TROPONINI in the last 168 hours. BNP (last 3 results) No results for input(s): PROBNP in the last 8760 hours. HbA1C: No results for input(s): HGBA1C in the last 72 hours. CBG: No results for input(s): GLUCAP in the last 168 hours. Lipid Profile: No results for input(s): CHOL, HDL, LDLCALC, TRIG, CHOLHDL, LDLDIRECT in the last 72 hours. Thyroid Function Tests: No results for input(s): TSH, T4TOTAL, FREET4, T3FREE, THYROIDAB in the last  72 hours. Anemia Panel: No results for input(s): VITAMINB12, FOLATE, FERRITIN, TIBC, IRON, RETICCTPCT in the last 72 hours. Urine analysis:    Component Value Date/Time   COLORURINE YELLOW 09/23/2017 0930   APPEARANCEUR HAZY (A) 09/23/2017 0930   LABSPEC 1.009 09/23/2017 0930   PHURINE 5.0 09/23/2017 0930   GLUCOSEU NEGATIVE 09/23/2017 0930   HGBUR LARGE (A) 09/23/2017 0930   BILIRUBINUR NEGATIVE 09/23/2017 0930   KETONESUR NEGATIVE 09/23/2017 0930   PROTEINUR 30 (A) 09/23/2017 0930   NITRITE NEGATIVE 09/23/2017 0930   LEUKOCYTESUR NEGATIVE 09/23/2017 0930   Sepsis Labs: _0 (procalcitonin:4,lacticidven:4)  ) No results found for this or any previous visit (from the past 240 hour(s)).       Radiology Studies: Dg Chest Port 1 View  Result Date: 10/07/2017 CLINICAL DATA:  Pleural effusion EXAM: PORTABLE CHEST 1 VIEW COMPARISON:  Six days ago FINDINGS: Stable cardiomegaly. Symmetric bilateral airspace opacity with moderate left and small but likely loculated  right pleural effusions. Bulky calcified mediastinal lymph nodes. Right-sided porta catheter in stable position. IMPRESSION: Unchanged extensive bilateral airspace disease with small to moderate pleural effusions. Electronically Signed   By: Monte Fantasia M.D.   On: 10/07/2017 07:41        Scheduled Meds: . acyclovir  400 mg Oral Daily  . bethanechol  10 mg Oral TID  . dextromethorphan-guaiFENesin  1 tablet Oral BID  . folic acid  1 mg Oral Daily  . miconazole nitrate  1 application Topical BID  . midodrine  15 mg Oral TID WC  . predniSONE  25 mg Oral Q breakfast  . pregabalin  50 mg Oral BID  . rivaroxaban  20 mg Oral Daily  . senna-docusate  1 tablet Oral BID   Continuous Infusions:    LOS: 11 days    Time spent: 40 minutes    Ester Mabe, Geraldo Docker, MD [facility] Pager [phone]   If 7PM-7AM, please contact night-coverage [email or web address] Password [facility] 10/08/2017, 7:05 PM

## 2017-10-09 DIAGNOSIS — I959 Hypotension, unspecified: Secondary | ICD-10-CM | POA: Diagnosis not present

## 2017-10-09 DIAGNOSIS — D649 Anemia, unspecified: Secondary | ICD-10-CM | POA: Diagnosis not present

## 2017-10-09 DIAGNOSIS — I9589 Other hypotension: Secondary | ICD-10-CM | POA: Diagnosis not present

## 2017-10-09 DIAGNOSIS — R627 Adult failure to thrive: Secondary | ICD-10-CM | POA: Diagnosis not present

## 2017-10-09 DIAGNOSIS — R0689 Other abnormalities of breathing: Secondary | ICD-10-CM | POA: Diagnosis not present

## 2017-10-09 DIAGNOSIS — E43 Unspecified severe protein-calorie malnutrition: Secondary | ICD-10-CM | POA: Diagnosis not present

## 2017-10-09 DIAGNOSIS — R338 Other retention of urine: Secondary | ICD-10-CM

## 2017-10-09 DIAGNOSIS — I499 Cardiac arrhythmia, unspecified: Secondary | ICD-10-CM | POA: Diagnosis not present

## 2017-10-09 DIAGNOSIS — I481 Persistent atrial fibrillation: Secondary | ICD-10-CM | POA: Diagnosis not present

## 2017-10-09 DIAGNOSIS — N08 Glomerular disorders in diseases classified elsewhere: Secondary | ICD-10-CM

## 2017-10-09 DIAGNOSIS — I5033 Acute on chronic diastolic (congestive) heart failure: Secondary | ICD-10-CM | POA: Diagnosis not present

## 2017-10-09 DIAGNOSIS — D472 Monoclonal gammopathy: Secondary | ICD-10-CM | POA: Diagnosis not present

## 2017-10-09 DIAGNOSIS — M2550 Pain in unspecified joint: Secondary | ICD-10-CM | POA: Diagnosis not present

## 2017-10-09 DIAGNOSIS — D528 Other folate deficiency anemias: Secondary | ICD-10-CM

## 2017-10-09 DIAGNOSIS — R55 Syncope and collapse: Secondary | ICD-10-CM | POA: Diagnosis not present

## 2017-10-09 DIAGNOSIS — I272 Pulmonary hypertension, unspecified: Secondary | ICD-10-CM | POA: Diagnosis not present

## 2017-10-09 DIAGNOSIS — J90 Pleural effusion, not elsewhere classified: Secondary | ICD-10-CM | POA: Diagnosis not present

## 2017-10-09 DIAGNOSIS — J449 Chronic obstructive pulmonary disease, unspecified: Secondary | ICD-10-CM | POA: Diagnosis not present

## 2017-10-09 DIAGNOSIS — J869 Pyothorax without fistula: Secondary | ICD-10-CM | POA: Diagnosis not present

## 2017-10-09 DIAGNOSIS — R1312 Dysphagia, oropharyngeal phase: Secondary | ICD-10-CM | POA: Diagnosis not present

## 2017-10-09 DIAGNOSIS — E854 Organ-limited amyloidosis: Secondary | ICD-10-CM

## 2017-10-09 DIAGNOSIS — R404 Transient alteration of awareness: Secondary | ICD-10-CM | POA: Diagnosis not present

## 2017-10-09 DIAGNOSIS — I5043 Acute on chronic combined systolic (congestive) and diastolic (congestive) heart failure: Secondary | ICD-10-CM | POA: Diagnosis not present

## 2017-10-09 DIAGNOSIS — E8581 Light chain (AL) amyloidosis: Secondary | ICD-10-CM | POA: Diagnosis not present

## 2017-10-09 DIAGNOSIS — J9621 Acute and chronic respiratory failure with hypoxia: Secondary | ICD-10-CM | POA: Diagnosis not present

## 2017-10-09 DIAGNOSIS — Z7401 Bed confinement status: Secondary | ICD-10-CM | POA: Diagnosis not present

## 2017-10-09 DIAGNOSIS — R402441 Other coma, without documented Glasgow coma scale score, or with partial score reported, in the field [EMT or ambulance]: Secondary | ICD-10-CM | POA: Diagnosis not present

## 2017-10-09 DIAGNOSIS — I482 Chronic atrial fibrillation: Secondary | ICD-10-CM | POA: Diagnosis not present

## 2017-10-09 DIAGNOSIS — I5032 Chronic diastolic (congestive) heart failure: Secondary | ICD-10-CM | POA: Diagnosis not present

## 2017-10-09 DIAGNOSIS — C8300 Small cell B-cell lymphoma, unspecified site: Secondary | ICD-10-CM | POA: Diagnosis not present

## 2017-10-09 DIAGNOSIS — J690 Pneumonitis due to inhalation of food and vomit: Secondary | ICD-10-CM | POA: Diagnosis not present

## 2017-10-09 DIAGNOSIS — N049 Nephrotic syndrome with unspecified morphologic changes: Secondary | ICD-10-CM | POA: Diagnosis not present

## 2017-10-09 DIAGNOSIS — I509 Heart failure, unspecified: Secondary | ICD-10-CM | POA: Diagnosis not present

## 2017-10-09 DIAGNOSIS — R278 Other lack of coordination: Secondary | ICD-10-CM | POA: Diagnosis not present

## 2017-10-09 DIAGNOSIS — M6281 Muscle weakness (generalized): Secondary | ICD-10-CM | POA: Diagnosis not present

## 2017-10-09 DIAGNOSIS — J441 Chronic obstructive pulmonary disease with (acute) exacerbation: Secondary | ICD-10-CM | POA: Diagnosis not present

## 2017-10-09 MED ORDER — MICONAZOLE NITRATE POWD: 1.0000 "application " | Freq: Two times a day (BID) | 0 refills | Status: AC

## 2017-10-09 MED ORDER — DM-GUAIFENESIN ER 30-600 MG PO TB12: 1.0000 | ORAL_TABLET | Freq: Two times a day (BID) | ORAL | 0 refills | Status: AC

## 2017-10-09 MED ORDER — RIVAROXABAN 20 MG PO TABS: 20.0000 mg | ORAL_TABLET | Freq: Every day | ORAL | 0 refills | Status: AC

## 2017-10-09 MED ORDER — MIDODRINE HCL 5 MG PO TABS: 15.0000 mg | ORAL_TABLET | Freq: Three times a day (TID) | ORAL | 0 refills | Status: AC

## 2017-10-09 MED ORDER — PREDNISONE 5 MG PO TABS: 25.0000 mg | ORAL_TABLET | Freq: Every day | ORAL | 0 refills | Status: DC

## 2017-10-09 MED ORDER — PREDNISONE 5 MG PO TABS: 15.0000 mg | ORAL_TABLET | Freq: Every day | ORAL | 0 refills | Status: AC

## 2017-10-09 MED ORDER — ACETAMINOPHEN 325 MG PO TABS: 650.0000 mg | ORAL_TABLET | Freq: Four times a day (QID) | ORAL | 0 refills | Status: AC | PRN

## 2017-10-09 MED ORDER — HEPARIN SOD (PORK) LOCK FLUSH 100 UNIT/ML IV SOLN
500.0000 [IU] | INTRAVENOUS | Status: AC | PRN
  Administered 2017-10-09: 500 [IU]

## 2017-10-09 MED ORDER — SALINE SPRAY 0.65 % NA SOLN: 1.0000 | NASAL | 0 refills | Status: AC | PRN

## 2017-10-09 MED ORDER — MUSCLE RUB 10-15 % EX CREA: 1.0000 "application " | TOPICAL_CREAM | Freq: Four times a day (QID) | CUTANEOUS | 0 refills | Status: AC | PRN

## 2017-10-09 MED ORDER — SIMETHICONE 80 MG PO CHEW: 160.0000 mg | CHEWABLE_TABLET | Freq: Four times a day (QID) | ORAL | 0 refills | Status: AC | PRN

## 2017-10-09 MED ORDER — ONDANSETRON HCL 4 MG PO TABS: 4.0000 mg | ORAL_TABLET | Freq: Four times a day (QID) | ORAL | 0 refills | Status: AC | PRN

## 2017-10-09 MED ORDER — BETHANECHOL CHLORIDE 10 MG PO TABS: 10.0000 mg | ORAL_TABLET | Freq: Three times a day (TID) | ORAL | 0 refills | Status: AC

## 2017-10-10 ENCOUNTER — Telehealth: Payer: Self-pay | Admitting: *Deleted

## 2017-10-10 ENCOUNTER — Telehealth: Payer: Self-pay | Admitting: Internal Medicine

## 2017-10-10 DIAGNOSIS — I9589 Other hypotension: Secondary | ICD-10-CM | POA: Diagnosis not present

## 2017-10-10 DIAGNOSIS — J449 Chronic obstructive pulmonary disease, unspecified: Secondary | ICD-10-CM | POA: Diagnosis not present

## 2017-10-10 DIAGNOSIS — J9621 Acute and chronic respiratory failure with hypoxia: Secondary | ICD-10-CM | POA: Diagnosis not present

## 2017-10-10 DIAGNOSIS — I5032 Chronic diastolic (congestive) heart failure: Secondary | ICD-10-CM | POA: Diagnosis not present

## 2017-10-10 DIAGNOSIS — I482 Chronic atrial fibrillation: Secondary | ICD-10-CM | POA: Diagnosis not present

## 2017-10-10 DIAGNOSIS — C8300 Small cell B-cell lymphoma, unspecified site: Secondary | ICD-10-CM | POA: Diagnosis not present

## 2017-10-10 DIAGNOSIS — I509 Heart failure, unspecified: Secondary | ICD-10-CM | POA: Diagnosis not present

## 2017-10-10 DIAGNOSIS — J90 Pleural effusion, not elsewhere classified: Secondary | ICD-10-CM | POA: Diagnosis not present

## 2017-10-10 DIAGNOSIS — J690 Pneumonitis due to inhalation of food and vomit: Secondary | ICD-10-CM | POA: Diagnosis not present

## 2017-10-10 NOTE — Telephone Encounter (Signed)
Pt was on TCM report admitted 09/27/17 w/ recurrent hypoxic respiratory failurewith multifactorial etiology. During his hospitalization patient was treated for Systemic AL amyloidosis, IgG kappa MGUS, chronic diastolic CHF, acute on chronic respiratory failure with hypoxia/emphysema, chronic bilateral pleural effusion/right empyema and severe protein calorie malnutrition. Pt D/C 2017-10-24 to SNF [facility].Marland KitchenJohny Chess

## 2017-10-10 NOTE — Telephone Encounter (Signed)
Per Joellen Jersey, patient needs to be placed on NP schedule first then can be placed with CY.  Called and spoke with patients wife, she verbalized understanding. Patient has been scheduled on 6.3.19 with TP. Nothing further needed.

## 2017-10-11 DIAGNOSIS — 419620001 Death: Secondary | SNOMED CT | POA: Diagnosis not present

## 2017-10-11 NOTE — Clinical Social Work Placement (Addendum)
Nurse to call report to [phone], Room 204  Transport set up for after 4:00 PM.    CLINICAL SOCIAL WORK PLACEMENT  NOTE  Date:  2017/10/26  Patient Details  Name: [name] MRN: [id number] Date of Birth: 1945-10-27  Clinical Social Work is seeking post-discharge placement for this patient at the [place] level of care (*CSW will initial, date and re-position this form in  chart as items are completed):  Yes   Patient/family provided with [place] Work Department's list of facilities offering this level of care within the geographic area requested by the patient (or if unable, by the patient's family).  Yes   Patient/family informed of their freedom to choose among providers that offer the needed level of care, that participate in Medicare, Medicaid or managed care program needed by the patient, have an available bed and are willing to accept the patient.  Yes   Patient/family informed of [facility]'s ownership interest in [facility] and [facility], as well as of the fact that they are under no obligation to receive care at these facilities.  PASRR submitted to EDS on       PASRR number received on       Existing PASRR number confirmed on 10/03/17     FL2 transmitted to all facilities in geographic area requested by pt/family on 10/03/17     FL2 transmitted to all facilities within larger geographic area on       Patient informed that his/her managed care company has contracts with or will negotiate with certain facilities, including the following:        Yes   Patient/family informed of bed offers received.  Patient chooses bed at [facility]     Physician recommends and patient chooses bed at      Patient to be transferred to [facility] on 10-26-17.  Patient to be transferred to facility by [facility]     Patient family notified on October 26, 2017 of transfer.  Name of family member notified:   [name]     PHYSICIAN       Additional Comment:    _______________________________________________ Geralynn Ochs, [place] 10/26/17, 2:47 PM

## 2017-10-11 NOTE — Discharge Summary (Signed)
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 Brunetta [name] - Per patient and wife completed chemotherapy ( Bendamustine/Rituxan).  Has completed 6 cycles (last cycle in April): Partial response PER oncology note.  -Extensive conversation with patient and wife explaining that amyloidosis is not reversible, although chemotherapy may slow further deposition, secondary to omission of lymphoma..   IgG kappa MGUS  -Per Dr. Grier [name] oncology note patient also diagnosed with MGUS.  No overt evidence of cardiac involvement based on ECHO    Anasarca - Secondary to low oncotic state.  Albumin 1.6. -Could not aggressively diuresis as this would lead to hypoperfusion and worsening of endorgan function.  Patient euvolemic.   -See CHF   Hypothermia -Negative indication of infection, most likely secondary to poor nutritional status.      Chronic diastolic CHF -Believe secondary to cardiac AL amyloidosis.  No new suggestions per cardiology -Strict in and out since admission  -8.9 L - Daily weight Filed Weights   10/06/17 2353 10/08/17 0421 Nov 08, 2017 0126  Weight: 143 lb 4.8 oz (65 kg) 145 lb 1 oz (65.8 kg) 144 lb 6.4 oz (65.5 kg)  - Diuretic strategy will be based on patient's weight.  Will need to adjust Lasix dosing dependent on daily weight.  - Follow-up with Dr. Aundra [name] as outpatient -Patient currently euvolemic.  Would use his current weight as his dry weight.   Chronic hypotension - Midodrine 15 mg TID   Persistent atrial fibrillation -History of failed DCCV -Currently rate controlled.Hx  failed DCCV  -On Xarelto for anticoagulation   Acute on chronic respiratory failure with  hypoxia/Emphysema -Multifactorial severe emphysema, bilateral pleural effusion (developing empyema RIGHT?),  Pneumonia? -Continue to treat underlying problems, mild improvement. - Discharge patient on prednisone 15 mg daily.  Recommend slow taper over the next 4 weeks to avoid relapse  -Flutter valve -DuoNeb QID - Mucinex DM   Chronic Bilateral pleural Effusion/ RIGHT Lung empyema -Patient seen by Dr. Tharon Aquas [name] cardiothoracic surgery 12/11/72 as outpatient follow-up for RIGHT sided VATS procedure.  At that time no further surgical intervention required. -5/13 S/P thoracentesis (previous admission) - LEFT pleural effusion appears >> RIGHT effusion.  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   Severe protein calorie malnutrition -I patient currently appears to be consuming appropriate amount of calories.      Iron deficiency anemia/folate deficiency anemia - Transfused 2 units PRBC    Pressure injury of sacrum -Care per WOC protocol    Hematuria and urinary retention -Foley catheter removed after patient was started on Bethanechol 10 mg TID         Discharge Diagnoses:  Principal Problem:   Acute-on-chronic respiratory failure ([place]) Active Problems:   Bilateral pleural effusion   COPD mixed type (HCC)   Protein-calorie malnutrition, severe (HCC)   Loculated pleural effusion   Empyema (HCC)   Persistent atrial fibrillation (HCC)   Acute on chronic combined systolic and  diastolic CHF (congestive heart failure) (HCC)   Pulmonary HTN (HCC)   Aspiration pneumonia (HCC)   Hypoalbuminemia   Hypotension   Elevated brain natriuretic peptide  (BNP) level   Nephrotic syndrome   Elevated troponin   Anemia   Lethargy   Failure to thrive in adult   Acute on chronic congestive heart failure [facility])   Discharge Condition: Regular, thin liquid  Diet recommendation: Guarded  Filed Weights   10/06/17 2353 10/08/17 0421 12-Oct-2017 0126  Weight: 143 lb 4.8 oz (65 kg) 145 lb 1 oz (65.8 kg) 144 lb 6.4 oz (65.5 kg)    History of present illness:  72 y.o.WM PMHx  Chronic diastolic CHF (EF of 27%), atrial fibrillation on Xarelto, COPD on home oxygen at 3 L, malignant lymphoplasmayctic lymphoma, and a hospitalization for acute on chronic heart failure secondary to cardiac amyloidosis 09/18/17 > 09/26/17    Presented to the ED w/ recurrent hypoxic respiratory failure with multifactorial etiology.    Patient was discharged from [facility] the evening prior to his return to the ED. During that admission he diuresed 7 L. He was sent home on 4L [place], increased from his prior 3L. During his hospitalization patient was treated for Systemic AL amyloidosis, IgG kappa MGUS, chronic diastolic CHF, acute on chronic respiratory failure with hypoxia/emphysema, chronic bilateral pleural effusion/right empyema and severe protein calorie malnutrition.  Patient and wife understood patient's multiple medical conditions have been exacerbated by his systemic AL amyloidosis and its chemotherapy treatment.  Patient was stabilized over an almost 2-week.  Patient and wife have had multiple discussions with multiple staff members and now understand that patient is in his terminal stage, disease process and that he will not be cured.  However can improve his quality of life through improvement of his nutrition and physical fitness.  Stable for discharge.   Procedures: 5/10 echocardiogram:Left ventricle:/Incomplete study secondary to patient's discomfort.  The cavity size was normal.  moderate LVH. LVEF= 60% to 65%.-- The study is not technically sufficient to   allow  evaluation of LV diastolic function. - Left atrium: Severely dilated. ------------------------------------------------------------------------------------------------------------------------   5/17 d/c from [facility] 5/18 admit to [facility]  5/18 CT angiogram PE protocol:-Negative PE - Emphysema -Worsening dependent airspace disease and pleural effusions throughout all lobes, favored to represent sequela of aspiration and aspiration pneumonia/pneumonitis.  -evidence of partial loculation of right-sided pleural fluid, potentially representing developing empyema versus complex pleural fluid. -Reactive mediastinal lymphadenopathy. Partially calcified lymph nodes of the mediastinum, compatible with prior granulomatous disease. 5/22 PCXR:-RIGHT chest wall port.-Changes of prior granulomatous disease with multiple calcified lymph nodes are again seen. Diffuse airspace opacity is again identified RIGHT>>LEFT stable - Bilateral pleural effusion    Consultations: Cardiology  Cultures    Antibiotics Anti-infectives (From admission, onward)   Start     Stop   09/28/17 1000  acyclovir (ZOVIRAX) tablet 400 mg         09/27/17 2300  vancomycin (VANCOCIN) IVPB 750 mg/150 ml premix  Status:  Discontinued     09/27/17 1122   09/27/17 2300  vancomycin (VANCOCIN) 500 mg in sodium chloride 0.9 % 100 mL IVPB  Status:  Discontinued     09/28/17 1529   09/27/17 1800  ceFEPIme (MAXIPIME) 1 g in sodium chloride 0.9 % 100 mL IVPB  Status:  Discontinued     09/28/17 1529   09/27/17 0945  vancomycin (VANCOCIN) IVPB 1000 mg/200 mL premix     09/27/17 1230   09/27/17 0945  ceFEPIme (  MAXIPIME) 2 g in sodium chloride 0.9 % 100 mL IVPB     09/27/17 1111       Discharge Exam: Vitals:   2017/10/20 0200 October 20, 2017 0300 20-Oct-2017 0400 10/20/17 0833  BP: 117/82 113/85 117/75 103/80  Pulse: 72 71 73 78  Resp: _0 Temp:   (!) 97.5 F (36.4 C)   TempSrc:   Oral   SpO2: 97% 100% 100% 95%  Weight:      Height:         General: A/O x 4 , Acute on Chronic respiratory distress ( improved), cachectic Neck:  Negative scars, masses, torticollis, lymphadenopathy, JVD Lungs:  clear to auscultation bilateral,  Mild positive LUL rhonchi (improving), negative  wheezes or crackles Cardiovascular: Irregular regular rhythm and rate, negative murmur, gallop.  Negative rub, normal S1 and S2  Discharge Instructions   Allergies as of 20-Oct-2017      Reactions   Demerol [meperidine] Other (See Comments)   UNSPECIFIED REACTION  Causes system to shutdown. ?    Oxycodone Hcl Other (See Comments)   Pt states this medication 'wires him up' and makes pt hyper; pt does not want to take this ever again      Medication List    STOP taking these medications   amoxicillin-clavulanate 875-125 MG tablet Commonly known as:  AUGMENTIN   furosemide 40 MG tablet Commonly known as:  LASIX   potassium chloride SA 20 MEQ tablet Commonly known as:  K-DUR,KLOR-CON   prochlorperazine 10 MG tablet Commonly known as:  COMPAZINE     TAKE these medications   acetaminophen 325 MG tablet Commonly known as:  TYLENOL Take 2 tablets (650 mg total) by mouth every 6 (six) hours as needed for mild pain (or Fever >/= 101).   acyclovir 400 MG tablet Commonly known as:  ZOVIRAX Take 1 tablet (400 mg total) by mouth daily.   bethanechol 10 MG tablet Commonly known as:  URECHOLINE Take 1 tablet (10 mg total) by mouth 3 (three) times daily.   dextromethorphan-guaiFENesin 30-600 MG 12hr tablet Commonly known as:  MUCINEX DM Take 1 tablet by mouth 2 (two) times daily.   feeding supplement (ENSURE ENLIVE) Liqd Take 237 mLs by mouth daily.   folic acid 1 MG tablet Commonly known as:  FOLVITE Take 1 tablet (1 mg total) by mouth daily.   miconazole nitrate Powd Commonly known as:  MICATIN Apply 1 application topically 2 (two) times daily. What changed:  additional instructions   midodrine 5 MG tablet Commonly known as:   PROAMATINE Take 3 tablets (15 mg total) by mouth 3 (three) times daily with meals. What changed:    medication strength  how much to take   morphine 15 MG tablet Commonly known as:  MSIR Take 1 tablet (15 mg total) by mouth every 4 (four) hours as needed for moderate pain.   MUSCLE RUB 10-15 % Crea Apply 1 application topically 4 (four) times daily as needed for muscle pain.   ondansetron 4 MG tablet Commonly known as:  ZOFRAN Take 1 tablet (4 mg total) by mouth every 6 (six) hours as needed for nausea.   predniSONE 5 MG tablet Commonly known as:  DELTASONE Take 3 tablets (15 mg total) by mouth daily with breakfast. Start taking on:  10/10/2017   pregabalin 50 MG capsule Commonly known as:  LYRICA Take 1 capsule (50 mg total) by mouth 2 (two) times daily.   PROBIOTIC DAILY PO Take 1 tablet  by mouth daily.   rivaroxaban 20 MG Tabs tablet Commonly known as:  XARELTO Take 1 tablet (20 mg total) by mouth daily. Start taking on:  10/10/2017 What changed:  See the new instructions.   senna-docusate 8.6-50 MG tablet Commonly known as:  Senokot-S Take 1 tablet by mouth 2 (two) times daily.   simethicone 80 MG chewable tablet Commonly known as:  MYLICON Chew 2 tablets (160 mg total) by mouth 4 (four) times daily as needed for flatulence (first dose now please).   sodium chloride 0.65 % Soln nasal spray Commonly known as:  OCEAN Place 1 spray into both nostrils as needed for congestion (nose irritation).      Allergies  Allergen Reactions  . Demerol [Meperidine] Other (See Comments)    UNSPECIFIED REACTION  Causes system to shutdown. ?   . Oxycodone Hcl Other (See Comments)    Pt states this medication 'wires him up' and makes pt hyper; pt does not want to take this ever again    Contact information for follow-up providers    Clegg, Amy D, NP Follow up on 10/15/2017.   Specialty:  Cardiology Why:  Please arrive 15 minutes early for your 2:00pm Advanced Heart Failure  appointment Contact information: [address] [address] [phone]            Contact information for after-discharge care    Destination    [facility] .   Service:  Skilled Nursing Contact information: [place] [phone]                   The results of significant diagnostics from this hospitalization (including imaging, microbiology, ancillary and laboratory) are listed below for reference.    Significant Diagnostic Studies: Dg Chest 2 View  Result Date: 09/21/2017 CLINICAL DATA:  Follow-up pulmonary edema. EXAM: CHEST - 2 VIEW COMPARISON:  09/18/2017 FINDINGS: Cardiomegaly as seen previously. Redemonstration of bilateral effusions with pulmonary atelectasis. Effusions may be larger. Pulmonary density appears worsened, particularly on the right. IMPRESSION: Radiographic worsening. Enlargement of effusions, particularly on the right. Worsened pulmonary density presumed represent worsened edema. Can't rule out pneumonia particularly on the right. Electronically Signed   By: Nelson Chimes M.D.   On: 09/21/2017 13:29   Ct Angio Chest Pe W Or Wo Contrast  Result Date: 09/27/2017 CLINICAL DATA:  72 year old male with a history of congestive heart failure EXAM: CT ANGIOGRAPHY CHEST WITH CONTRAST TECHNIQUE: Multidetector CT imaging of the chest was performed using the standard protocol during bolus administration of intravenous contrast. Multiplanar CT image reconstructions and MIPs were obtained to evaluate the vascular anatomy. CONTRAST:  15m ISOVUE-370 IOPAMIDOL (ISOVUE-370) INJECTION 76% COMPARISON:  PET-CT 04/04/2017, abdominal CT 06/12/2017 FINDINGS: Cardiovascular: Heart: Redemonstration of cardiomegaly. Improved pericardial effusion compared to the prior. Calcifications of left anterior descending, right, circumflex coronary arteries. Aorta: Calcifications of the aortic arch and the  branch vessels. Calcifications of descending thoracic aorta. Pulmonary arteries: No central, lobar, segmental, or proximal subsegmental filling defects. Mediastinum/Nodes: Mediastinal adenopathy, with paratracheal lymphadenopathy. Largest nodal mass in the left paratracheal nodal station. No endotracheal debris. Debris within the right and left lower lobe bronchi. Unremarkable thyroid. Lungs/Pleura: Centrilobular and paraseptal emphysema. No significant interlobular septal thickening. Multifocal airspace opacity, in a predominantly dependent distribution of the bilateral lungs, involving bilateral upper lobes, middle lobe, bilateral lower lobes. Endobronchial debris in the bilateral lower lobes. Bilateral pleural effusions. There is evidence of complex right-sided pleural effusion with  partial loculation along the medial aspect of the right lower lobe. Partially calcified right hilar and right mediastinal lymph nodes. Upper Abdomen: Poorly evaluated upper abdomen. Musculoskeletal: Cachectic appearance of the chest wall/abdomen wall with edema. No acute displaced fracture identified. Scoliotic curvature of the thoracic spine with degenerative changes. No bony canal narrowing. Rib deformities of prior partially healed rib fractures on the right. Right IJ port catheter. Review of the MIP images confirms the above findings. IMPRESSION: CT negative for pulmonary emboli. Worsening dependent airspace disease and pleural effusions throughout all lobes, favored to represent sequela of aspiration and aspiration pneumonia/pneumonitis. There is evidence of partial loculation of right-sided pleural fluid, potentially representing developing empyema versus complex pleural fluid. Reactive mediastinal lymphadenopathy. Partially calcified lymph nodes of the mediastinum, compatible with prior granulomatous disease. Emphysema (ICD10-J43.9). Improved pericardial effusion compared to the prior CT. Cardiomegaly. Coronary artery disease.  Aortic Atherosclerosis (ICD10-I70.0). Electronically Signed   By: Corrie Mckusick D.O.   On: 09/27/2017 13:09   Dg Chest Port 1 View  Result Date: 10/07/2017 CLINICAL DATA:  Pleural effusion EXAM: PORTABLE CHEST 1 VIEW COMPARISON:  Six days ago FINDINGS: Stable cardiomegaly. Symmetric bilateral airspace opacity with moderate left and small but likely loculated right pleural effusions. Bulky calcified mediastinal lymph nodes. Right-sided porta catheter in stable position. IMPRESSION: Unchanged extensive bilateral airspace disease with small to moderate pleural effusions. Electronically Signed   By: Monte Fantasia M.D.   On: 10/07/2017 07:41   Dg Chest Port 1 View  Result Date: 10/01/2017 CLINICAL DATA:  Follow-up pleural effusion EXAM: PORTABLE CHEST 1 VIEW COMPARISON:  09/27/2017 FINDINGS: Cardiac shadow remains enlarged. Right chest wall port is again seen and stable. Changes of prior granulomatous disease with multiple calcified lymph nodes are again seen. Diffuse airspace opacity is again identified bilaterally right greater than left overall stable from the prior exam. Bilateral pleural effusions are noted. IMPRESSION: No change from the prior exam. Electronically Signed   By: Inez Catalina M.D.   On: 10/01/2017 07:03   Dg Chest Portable 1 View  Result Date: 09/27/2017 CLINICAL DATA:  Shortness of breath with hypoxia. History of lymphoma. EXAM: PORTABLE CHEST 1 VIEW COMPARISON:  09/23/2017 and 05/09/2017 FINDINGS: Right IJ Port-A-Cath with tip over the right atrium unchanged. Stable opacification over the right midlung as well as bilateral perihilar regions and lung bases left worse than right. Mild stable cardiomegaly. Remainder of the exam is unchanged. IMPRESSION: Persistent airspace opacification over the right midlung with bilateral perihilar and bibasilar opacification left worse than right. Findings likely due to infection with bilateral effusions/basilar atelectasis. Stable cardiomegaly. Right  IJ Port-A-Cath with tip over the right atrium unchanged. Electronically Signed   By: Marin Olp M.D.   On: 09/27/2017 09:21   Dg Chest Port 1 View  Result Date: 09/23/2017 CLINICAL DATA:  Short of breath EXAM: PORTABLE CHEST 1 VIEW COMPARISON:  09/22/2017 FINDINGS: Extensive airspace disease in the right lung is unchanged. Right pleural effusion unchanged Progression of airspace disease in the left lung most notably in the left lung base. Interval development of small left effusion. Port-A-Cath tip in the SVC. IMPRESSION: Stable airspace disease and pleural effusion on the right Progression of airspace disease and pleural effusion on the left. Findings may be due to congestive heart failure versus pneumonia. Electronically Signed   By: Franchot Gallo M.D.   On: 09/23/2017 19:48   Dg Chest Port 1 View  Result Date: 09/22/2017 CLINICAL DATA:  Status post left-sided thoracentesis EXAM: PORTABLE CHEST 1  VIEW COMPARISON:  Chest radiograph Sep 21, 2017 FINDINGS: No evident pneumothorax. There is resolution of left pleural effusion following thoracentesis. There is a partially loculated right pleural effusion with consolidation throughout much of the right lung, with the greatest degree of concentration in the right mid lung, stable. There is a small calcified granuloma in the right upper lobe, stable. There is slight atelectasis in the medial left lung base. Left lung otherwise is clear. Heart is upper normal in size with pulmonary vascularity normal. No adenopathy. Port-A-Cath tip is in the superior vena cava. No evident bone lesions. IMPRESSION: Resolution of pleural effusion on the left without pneumothorax. Left lung clear except for slight left base atelectasis. Persistent partially loculated pleural effusion on the right with extensive consolidation throughout much of the right lung, stable. Stable cardiac silhouette. Port-A-Cath tip in superior vena cava near cavoatrial junction. Electronically Signed    By: Lowella Grip III M.D.   On: 09/22/2017 15:37   Dg Chest Portable 1 View  Result Date: 09/18/2017 CLINICAL DATA:  Weakness.  Edema in the feet and hands. EXAM: PORTABLE CHEST 1 VIEW COMPARISON:  05/09/2017 and older exams. FINDINGS: Moderate enlargement of the cardiopericardial silhouette. Bilateral pleural effusions. Bilateral interstitial thickening and hazy central and lung base airspace opacities. No pneumothorax. Stable calcified right paratracheal lymph nodes. Right anterior chest wall Port-A-Cath, new from the prior exam. Catheter tip projects in the right atrium. IMPRESSION: 1. Findings are consistent with congestive heart failure with interstitial and hazy airspace pulmonary edema and bilateral pleural effusions. Electronically Signed   By: Lajean Manes M.D.   On: 09/18/2017 17:38   US Thoracentesis Asp Pleural Space W/img Guide  Result Date: 09/22/2017 INDICATION: Patient with history of CHF, malignant lymphoplasmacytic lymphoma, suspected cardiac AL amyloidosis, nephrotic syndrome, left pleural effusion. Request made for diagnostic and therapeutic left thoracentesis. EXAM: ULTRASOUND GUIDED DIAGNOSTIC AND THERAPEUTIC LEFT THORACENTESIS MEDICATIONS: None COMPLICATIONS: None immediate. PROCEDURE: An ultrasound guided thoracentesis was thoroughly discussed with the patient and questions answered. The benefits, risks, alternatives and complications were also discussed. The patient understands and wishes to proceed with the procedure. Written consent was obtained. Ultrasound was performed to localize and mark an adequate pocket of fluid in the left chest. The area was then prepped and draped in the normal sterile fashion. 1% Lidocaine was used for local anesthesia. Under ultrasound guidance a 6 Fr Safe-T-Centesis catheter was introduced. Thoracentesis was performed. The catheter was removed and a dressing applied. FINDINGS: A total of approximately 870 cc of light yellow fluid was removed.  Samples were sent to the laboratory as requested by the clinical team. IMPRESSION: Successful ultrasound guided diagnostic and therapeutic left thoracentesis yielding 870 cc of pleural fluid. Read by: Rowe Robert, PA-C Electronically Signed   By: Jerilynn Mages.  Shick M.D.   On: 09/22/2017 14:45    Microbiology: No results found for this or any previous visit (from the past 240 hour(s)).   Labs: Basic Metabolic Panel: Recent Labs  Lab 10/03/17 0517 10/04/17 0349 10/05/17 0401 10/06/17 0324 10/07/17 0420 10/08/17 0321  NA 142 142 139 143 144 143  K 4.9 4.1 4.4 4.1 4.3 4.1  CL 100* 100* 98* 101 102 102  CO2 34* 36* 34* 36* 36* 36*  GLUCOSE 118* 127* 126* 118* 132* 85  BUN 41* 45* 42* 41* 44* 45*  CREATININE 1.05 0.85 0.96 0.92 1.33* 0.91  CALCIUM 8.2* 8.4* 8.3* 8.3* 8.4* 8.3*  MG 1.6* 1.6* 1.6* 1.5* 2.0  --    Liver  Function Tests: Recent Labs  Lab 10/04/17 0349  ALBUMIN 2.1*   No results for input(s): LIPASE, AMYLASE in the last 168 hours. No results for input(s): AMMONIA in the last 168 hours. CBC: Recent Labs  Lab 10/04/17 0349 10/05/17 0401 10/06/17 0324 10/07/17 0420 10/08/17 0321  WBC 6.8 8.6 8.4 8.9 8.2  HGB 10.8* 11.6* 10.9* 11.9* 11.5*  HCT 34.4* 37.3* 34.9* 38.5* 36.8*  MCV 100.0 100.8* 102.0* 103.2* 102.5*  PLT 169 205 181 202 204   Cardiac Enzymes: No results for input(s): CKTOTAL, CKMB, CKMBINDEX, TROPONINI in the last 168 hours. BNP: BNP (last 3 results) Recent Labs    09/18/17 1718 09/22/17 0500 09/27/17 0918  BNP 1,924.4* 1,282.2* 1,404.8*    ProBNP (last 3 results) No results for input(s): PROBNP in the last 8760 hours.  CBG: No results for input(s): GLUCAP in the last 168 hours.     Signed:  Dia Crawford, MD [facility] [phone] pager

## 2017-10-11 NOTE — Care Management Note (Signed)
Case Management Note  Patient Details  Name: [name] MRN: [id number] Date of Birth: August 22, 1945  Subjective/Objective:  Acute/chronic resp failure, bil pl effuison, per MD on 5/29 , if patient urinates today he can dc to SNF today.                    Action/Plan: DC to SNF when ready.  Expected Discharge Date:  09/29/17               Expected Discharge Plan:  Skilled Nursing Facility  In-House Referral:  Clinical Social Work  Discharge planning Services  CM Consult  Post Acute Care Choice:    Choice offered to:     DME Arranged:    DME Agency:     HH Arranged:    [place] Agency:     Status of Service:  Completed, signed off  If discussed at [employer], dates discussed:    Additional Comments:  Zenon Mayo, RN 12-Oct-2017, 10:22 AM

## 2017-10-11 DEATH — deceased

## 2017-10-13 ENCOUNTER — Ambulatory Visit: Payer: Medicare Other | Admitting: Adult Health

## 2017-10-14 ENCOUNTER — Ambulatory Visit: Payer: Medicare Other | Admitting: Adult Health

## 2017-10-14 ENCOUNTER — Non-Acute Institutional Stay: Payer: Self-pay | Admitting: Hospice and Palliative Medicine

## 2017-10-14 DIAGNOSIS — Z515 Encounter for palliative care: Secondary | ICD-10-CM

## 2017-10-15 ENCOUNTER — Inpatient Hospital Stay (HOSPITAL_COMMUNITY): Payer: Medicare Other

## 2017-10-15 NOTE — Progress Notes (Signed)
Referral received from SNF. However, informed that patient had died prior to visit occurring.

## 2017-11-10 DEATH — deceased

## 2018-10-29 IMAGING — CT NM PET TUM IMG INITIAL (PI) SKULL BASE T - THIGH
1 of 9 series · 1 of 25 positions shown · non-contrast
Comparison: Chest CTs, most recent 10/02/2016.

CLINICAL DATA: Initial treatment strategy for history of prostate
cancer with thoracic adenopathy and pulmonary nodules..

EXAM:
NUCLEAR MEDICINE PET SKULL BASE TO THIGH
TECHNIQUE: 7.3 mCi F-18 FDG was injected intravenously. Full-ring PET imaging
was performed from the skull base to thigh after the radiotracer. CT
data was obtained and used for attenuation correction and anatomic
localization.
FASTING BLOOD GLUCOSE:  Value: 84 mg/dl

[Series 4: ct sk_thigh 5.0 b31f · axial · 5.0mm · 0.98mm/px · 1 of 222 slices shown]
[im 222/222  brain]
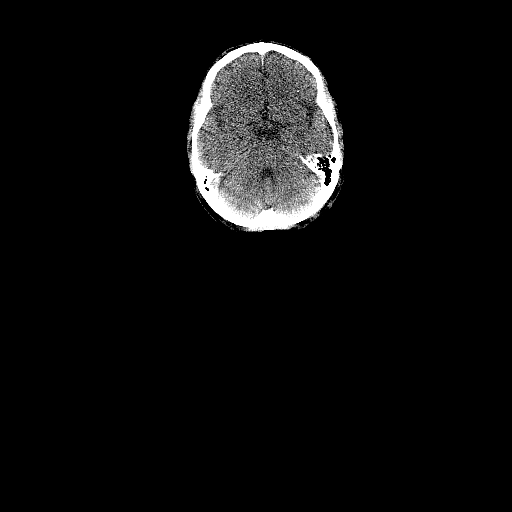

[1 of 25 positions shown; findings below may reference images not displayed]

FINDINGS: NECK

Hypermetabolism about the left side of the neck may be vascular or
correspond to a subcutaneous nodule or node. This measures 8 mm and
a S.U.V. max of 2.7 on image 26/series 4. adjacent low posterior
triangle nodes which are not FDG avid, including at 8 mm on image
29/series 4. No cervical adenopathy.

CHEST

Index left axillary node measures 13 mm and a S.U.V. max of 1.3 on
image 63/series 4. This maintains its fatty hilum but may have
enlarged minimally since 05/14/2016.

The dominant subcarinal nodal mass measures 2.7 cm on image
69/series 4. This is similar to the surrounding mediastinal pool,
measuring a S.U.V. max of 2.4.

Small right greater than left pleural effusions are similar to the
most recent CT. Right-sided loculation persists. Centrilobular
emphysema. 6 mm bilateral upper lobe pulmonary nodules are below PET
resolution. Right upper lobe calcified granuloma.

ABDOMEN/PELVIS

No abdominopelvic nodal or parenchymal hypermetabolism identified.
Inferior right hepatic lobe cyst. Abdominal aortic atherosclerosis.
Retroperitoneal adenopathy, include a 1.5 cm aortocaval node on
image 117/series 4. Not hypermetabolic. Right obturator node
measures 1.8 cm on image 157/series 4 and is not hypermetabolic.
Moderate prostatomegaly.

SKELETON

No abnormal marrow activity. Mild osteopenia. T7 and T8 remote
lateral right rib fractures.
IMPRESSION: 1. Adenopathy within the chest, abdomen, and pelvis. This is
primarily non hypermetabolic, with low-level hypermetabolism within
a dominant subcarinal mass. findings are suspicious for metastatic
disease from prostate cancer. non FDG avid neoplasm such as
low-grade lymphoma is felt less likely.
2. Possible small hypermetabolic left-sided cervical node versus
artifact from vascular activity.
3. Bilateral pulmonary nodules are nonspecific and below PET
resolution.
4. Small bilateral pleural effusions with right-sided loculation,
similar.
